# Patient Record
Sex: Male | Born: 1950 | Race: Black or African American | Hispanic: No | Marital: Married | Smoking: Former smoker
Health system: Southern US, Community
[De-identification: ages and names within clinical notes are randomized; demographics above are authoritative.]

## PROBLEM LIST (undated history)

## (undated) DIAGNOSIS — M25561 Pain in right knee: Secondary | ICD-10-CM

## (undated) DIAGNOSIS — R0600 Dyspnea, unspecified: Secondary | ICD-10-CM

## (undated) DIAGNOSIS — I4891 Unspecified atrial fibrillation: Secondary | ICD-10-CM

## (undated) DIAGNOSIS — G4733 Obstructive sleep apnea (adult) (pediatric): Secondary | ICD-10-CM

## (undated) DIAGNOSIS — I5032 Chronic diastolic (congestive) heart failure: Secondary | ICD-10-CM

## (undated) DIAGNOSIS — I499 Cardiac arrhythmia, unspecified: Secondary | ICD-10-CM

## (undated) DIAGNOSIS — Z7901 Long term (current) use of anticoagulants: Secondary | ICD-10-CM

## (undated) DIAGNOSIS — M1990 Unspecified osteoarthritis, unspecified site: Secondary | ICD-10-CM

## (undated) DIAGNOSIS — I4821 Permanent atrial fibrillation: Principal | ICD-10-CM

## (undated) DIAGNOSIS — D649 Anemia, unspecified: Secondary | ICD-10-CM

## (undated) DIAGNOSIS — I10 Essential (primary) hypertension: Secondary | ICD-10-CM

## (undated) DIAGNOSIS — C801 Malignant (primary) neoplasm, unspecified: Secondary | ICD-10-CM

## (undated) DIAGNOSIS — M25562 Pain in left knee: Secondary | ICD-10-CM

## (undated) DIAGNOSIS — J189 Pneumonia, unspecified organism: Secondary | ICD-10-CM

## (undated) HISTORY — DX: Unspecified atrial fibrillation: I48.91

## (undated) HISTORY — DX: Obstructive sleep apnea (adult) (pediatric): G47.33

## (undated) HISTORY — DX: Chronic diastolic (congestive) heart failure: I50.32

## (undated) HISTORY — DX: Permanent atrial fibrillation: I48.21

## (undated) HISTORY — DX: Long term (current) use of anticoagulants: Z79.01

## (undated) HISTORY — DX: Essential (primary) hypertension: I10

## (undated) NOTE — *Deleted (*Deleted)
[name]   [phone] [phone]   Clinic Follow up Note   Patient Care Team: [name], [name] as PCP - General (Family Medicine) [name] [name], MD as PCP - Cardiology (Cardiology) [name], MD as Consulting Physician (General Surgery) [name] [name], MD as Consulting Physician (Cardiology)  Date of Service:  04/14/2020  CHIEF COMPLAINT: Follow up stage III colon cancer  SUMMARY OF ONCOLOGIC HISTORY: Oncology History Overview Note  Cancer of right colon [facility])   Staging form: Colon and Rectum, AJCC 7th Edition   - Clinical stage from 04/12/2016: Stage IIIA (T2, N1, M0) - Signed by [name], MD on 05/01/2016    Cancer of right colon ([place])  03/04/2016 Procedure   COLONOSCOPY: A polypoid and ulcerated non-obstructing large mass was found in the cecum. The mass was noncircumferential. The mass measured three cm in length. In addition, its diameter measured three mm. (Dr. [name])    03/04/2016 Initial Biopsy   Diagnosis 1. Colon, biopsy, cecal - ADENOCARCINOMA. 2. Colon, polyp(s), transverse - BENIGN COLONIC MUCOSA WITH BENIGN LYMPHOID AGGREGATE. - NO ADENOMATOUS CHANGE OR MALIGNANCY IDENTIFIED. Microscopic Comment 1. The biopsies are involved by moderately to poorly differentiated colorectal type adenocarcinoma.   03/04/2016 Initial Diagnosis   Cecal cancer ([place])   03/15/2016 Imaging   1. Polypoid cecal mass. No findings of liver metastatic disease or other definite metastatic disease. There are some small adjacent pericecal lymph nodes which are not pathologically enlarged. 2. Adenopathy in the chest is present but was also present in 2010, albeit slightly less prominent. This may be reactive adenopathy related to the chronic exudative right pleural effusion.    04/07/2016 Tumor Marker   Patient's tumor was tested for the following markers: CEA. Results of the tumor marker test revealed 2.7.   04/12/2016 Definitive  Surgery   Laparoscopic right hemicolectomy (Dr. [name])   04/12/2016 Pathologic Stage   pT2 pN1 pMX--Grade 3 adenocarcinoma with 2/16 nodes positive, clear margins, negative for perineural or lymphvascular invasion; invades muscularis propria Loss of expression MLH1/PMS2   06/02/2016 - 09/13/2016 Adjuvant Chemotherapy    Ajuvant chemotherapy FOLFOX, every 2 weeks for 6 cycles (3 months), last cycle postponed due to hospitalization    06/29/2016 Genetic Testing   POLE c.[id number] VUS identified on the Colorectal cancer panel.  Negative genetic testing for the MSH2 inversion analysis ([place] inversion). The Colorectal Cancer Panel offered by [facility] includes sequencing and/or duplication/deletion testing of the following 19 genes: APC, ATM, AXIN2, BMPR1A, CDH1, CHEK2, EPCAM, MLH1, MSH2, MSH6, MUTYH, PMS2, POLD1, POLE, PTEN, SCG5/GREM1, SMAD4, STK11, and TP53. The report date is 06/22/2016 for the [facility] panel and 06/29/2016 for the [place] inversion.  POLE c.4523G>A VUS has been reclassified to a likely benign variant based on a combination of sources, e.g, internal data, published literature, population databases and in silico models. The reclassification date is June 09, 2017.    08/17/2016 - 08/20/2016 Hospital Admission   Admit date: 08/17/2016 Admission diagnosis: Acute respiratory failure with hypoxia  Additional comments: Patient was admitted initially with hypoxia and found to have H influenza positive-started on Tamiflu and broad-spectrum antibiotics were D escalate. He was hypotensive on admission given IV saline 75 cc per hour saline lock 2/16 and antihypertensives which were held including lisinopril and Lasix and hydralazine on discharge. Joint hospitalization he continued on his metoprolol twice a day   01/20/2017 Imaging   CT CAP W Contrast 01/20/17 IMPRESSION: 1. No evidence of local tumor recurrence at the  ileocolic anastomosis . 2. No findings suspicious for metastatic disease in the  chest, abdomen or pelvis. 3. Mild mediastinal lymphadenopathy is stable since 2010 and considered benign . 4. Stable morphologic changes suggestive of cirrhosis. 5. Stable patulous fluid-filled thoracic esophagus. Patchy ground-glass opacity in the posterior right upper lobe is probably inflammatory, possibly due to aspiration. 6. Stable chronic small loculated dependent right pleural effusion.    09/15/2017 Pathology Results   09/15/2017 Surgical Pathology Diagnosis Colon, polyp(s), sigmoid - TUBULAR ADENOMA. - NO HIGH GRADE DYSPLASIA OR MALIGNANCY.   01/15/2018 Imaging   01/15/2018 CT CAP W Contrast IMPRESSION: 1. No findings identified to suggest local tumor recurrence at the ileocolic anastomosis. No evidence for distant metastatic disease. 2. Stable mild mediastinal lymphadenopathy since 2010 and considered benign. 3. Similar appearance of patulous fluid-filled thoracic esophagus. 4. Unchanged right posterior pleural thickening with loculated pleural fluid. 5.  Aortic Atherosclerosis (ICD10-I70.0).   01/15/2018 Tumor Marker   CEA: 04/07/16: 2.7 04/27/2016: 2.11 07/14/2016: 4.25 10/21/2016: 3.60 01/16/17: 3.76 04/25/2017: 5.16 08/28/2017: 2.06 01/15/2018: 2.90    01/28/2019 Imaging   CT CAP W Contrast  IMPRESSION: Stable exam. No evidence of recurrent or metastatic carcinoma. No acute findings.      CURRENT THERAPY:  Surveillance  INTERVAL HISTORY: *** Steven Barnett is here for a follow up of colon cancer. He presents to the clinic alone.    REVIEW OF SYSTEMS:  *** Constitutional: Denies fevers, chills or abnormal weight loss Eyes: Denies blurriness of vision Ears, nose, mouth, throat, and face: Denies mucositis or sore throat Respiratory: Denies cough, dyspnea or wheezes Cardiovascular: Denies palpitation, chest discomfort or lower extremity swelling Gastrointestinal:  Denies nausea, heartburn or change in bowel habits Skin: Denies abnormal skin rashes  Lymphatics: Denies new lymphadenopathy or easy bruising Neurological:Denies numbness, tingling or new weaknesses Behavioral/Psych: Mood is stable, no new changes  All other systems were reviewed with the patient and are negative.  MEDICAL HISTORY:  Past Medical History:  Diagnosis Date  . Anemia   . Arthralgia of both knees   . Arthritis    "right knee" (10/01/2015)  . Atrial fibrillation ([place])   . Cancer (HCC)    STAGE 1 COLON CANCER  . Chronic anticoagulation 2010   Coumadin  . Chronic diastolic CHF (congestive heart failure), NYHA class 2 ([place])   . Dyspnea   . Dysrhythmia   . Hypertension   . OSA (obstructive sleep apnea) 2016   "couldn't take the mask during the testing" (10/01/2015)  . Permanent atrial fibrillation ([name]) 2010  . Pneumonia 3/29/2017and june 2017    SURGICAL HISTORY: Past Surgical History:  Procedure Laterality Date  . COLONOSCOPY WITH PROPOFOL N/A 03/04/2016   Procedure: COLONOSCOPY WITH PROPOFOL;  Surgeon: Carol Ada, MD;  Location: [facility];  Service: Endoscopy;  Laterality: N/A;  . COLONOSCOPY WITH PROPOFOL N/A 09/15/2017   Procedure: COLONOSCOPY WITH PROPOFOL;  Surgeon: Carol Ada, MD;  Location: [facility];  Service: Endoscopy;  Laterality: N/A;  . LAPAROSCOPIC PARTIAL COLECTOMY N/A 04/12/2016   Procedure: LAPAROSCOPIC ILEOCOLECTOMY;  Surgeon: [name], MD;  Location: [place];  Service: General;  Laterality: N/A;  . PORT-A-CATH REMOVAL N/A 02/16/2017   Procedure: REMOVAL PORT-A-CATH;  Surgeon: [name], MD;  Location: [place];  Service: General;  Laterality: N/A;  . PORTACATH PLACEMENT N/A 05/18/2016   Procedure: INSERTION PORT-A-CATH;  Surgeon: [name], MD;  Location: [place];  Service: General;  Laterality: N/A;  . THORACOTOMY Right 2010    I have  reviewed the social history and family history with the patient and they are unchanged from previous note.  ALLERGIES:  has No Known Allergies.  MEDICATIONS:   Current Outpatient Medications  Medication Sig Dispense Refill  . acetaminophen (TYLENOL) 500 MG tablet Take 1,000 mg by mouth 2 (two) times daily.    Marland Kitchen albuterol (PROVENTIL HFA;VENTOLIN HFA) 108 (90 Base) MCG/ACT inhaler Inhale 2 puffs into the lungs every 6 (six) hours as needed for wheezing or shortness of breath. 1 Inhaler 2  . aspirin EC 81 MG tablet Take 81 mg by mouth daily.    . colchicine 0.6 MG tablet Take 0.6 mg by mouth 2 (two) times daily.    Marland Kitchen diltiazem (CARDIZEM) 120 MG tablet TAKE 1 TABLET BY MOUTH ONCE DAILY 30 tablet 6  . furosemide (LASIX) 80 MG tablet Take 1 tablet (80 mg total) by mouth 2 (two) times daily. 180 tablet 3  . hydrALAZINE (APRESOLINE) 100 MG tablet     . metolazone (ZAROXOLYN) 5 MG tablet TAKE 1 TABLET BY MOUTH TWICE A WEEK ON  WEDNESDAYS  AND  SATURDAYS 15 tablet 6  . metoprolol tartrate (LOPRESSOR) 25 MG tablet Take 1 tablet (25 mg total) by mouth 2 (two) times daily. 180 tablet 3  . MITIGARE 0.6 MG CAPS     . potassium chloride SA (KLOR-CON) 20 MEQ tablet TAKE 1  BY MOUTH ONCE DAILY 90 tablet 3  . Turmeric (QC TUMERIC COMPLEX PO) Take 1 tablet by mouth daily.    . valsartan (DIOVAN) 320 MG tablet TAKE 1 TABLET BY MOUTH ONCE DAILY    . warfarin (COUMADIN) 4 MG tablet TAKE 2 TABLETS BY MOUTH ONCE DAILY AS DIRECTED BY  COUMADIN  CLINIC 60 tablet 0   No current facility-administered medications for this visit.   Facility-Administered Medications Ordered in Other Visits  Medication Dose Route Frequency Provider Last Rate Last Admin  . heparin lock flush 100 unit/mL  500 Units Intravenous Once PRN [name], MD      . sodium chloride flush (NS) 0.9 % injection 10 mL  10 mL Intravenous PRN [name], MD        PHYSICAL EXAMINATION: ECOG PERFORMANCE STATUS: {CHL ONC ECOG [id number]  There were no vitals filed for this visit. There were no vitals filed for this visit. *** GENERAL:alert, no distress and comfortable SKIN: skin color, texture, turgor are  normal, no rashes or significant lesions EYES: normal, Conjunctiva are pink and non-injected, sclera clear {OROPHARYNX:no exudate, no erythema and lips, buccal mucosa, and tongue normal}  NECK: supple, thyroid normal size, non-tender, without nodularity LYMPH:  no palpable lymphadenopathy in the cervical, axillary {or inguinal} LUNGS: clear to auscultation and percussion with normal breathing effort HEART: regular rate & rhythm and no murmurs and no lower extremity edema ABDOMEN:abdomen soft, non-tender and normal bowel sounds Musculoskeletal:no cyanosis of digits and no clubbing  NEURO: alert & oriented x 3 with fluent speech, no focal motor/sensory deficits  LABORATORY DATA:  I have reviewed the data as listed CBC Latest Ref Rng & Units 04/01/2020 08/02/2019 01/28/2019  WBC 4.0 - 10.5 K/uL 5.9 5.3 6.4  Hemoglobin 13.0 - 17.0 g/dL 15.3 16.0 16.4  Hematocrit 39 - 52 % 47.0 51.2 51.8  Platelets 150 - 400 K/uL 156 180 171     CMP Latest Ref Rng & Units 04/01/2020 11/13/2019 08/02/2019  Glucose 70 - 99 mg/dL 94 84 107(H)  BUN 8 - 23 mg/dL 52(H) 19 17  Creatinine 0.61 - 1.24 mg/dL  1.57(H) 1.11 1.00  Sodium 135 - 145 mmol/L 138 139 139  Potassium 3.5 - 5.1 mmol/L 3.5 4.5 3.8  Chloride 98 - 111 mmol/L 96(L) 98 102  CO2 22 - 32 mmol/L 28 28 31   Calcium 8.9 - 10.3 mg/dL 9.5 9.4 8.9  Total Protein 6.5 - 8.1 g/dL 8.1 - 7.6  Total Bilirubin 0.3 - 1.2 mg/dL 0.9 - 0.9  Alkaline Phos 38 - 126 U/L 97 - 133(H)  AST 15 - 41 U/L 19 - 17  ALT 0 - 44 U/L 16 - 13      RADIOGRAPHIC STUDIES: I have personally reviewed the radiological images as listed and agreed with the findings in the report. No results found.   ASSESSMENT & PLAN:  WESTEN CANCELLIERI is a 29 y.o. male with    1. Right colon cancer, invasive adenocarcinoma, G3, pT2N1M0, stage IIIA, MSI-H -He was diagnosed in 03/2016. He is s/p right hemicolectomy and adjuvant chemo FOLFOX -His last colonoscopy was 09/2017, he had 1 small benign  polyp. Overall benign exam. Will repeat in 3 years, 2022. *** -He is clinically stable and doing well. Labs reviewed, CBC And CMP WN. CEA and iron panel still pending.  -He is 4 years from diagnosis. His risk of recurrence has significantly reduced. Do not plan to repeat routine scan unless he develops concerning symptoms. Continue 5 year surveillance.  -Lab and F/u in 1 year for final visit   2. HTN, OSA, CHF, obesity -He will continue medications and continue to follow-up with his primary care physician -He is on Coumadin, managed by PCP.  -His recent leg and knee pain much improved on Tumeric. He can continue.    Plan -Lab and F/u in 1 year.     No problem-specific Assessment & Plan notes found for this encounter.   No orders of the defined types were placed in this encounter.  All questions were answered. The patient knows to call the clinic with any problems, questions or concerns. No barriers to learning was detected. The total time spent in the appointment was {CHL ONC TIME VISIT - [id number].     Joslyn Devon 04/14/2020   Oneal Deputy, am acting as scribe for [name], MD.   {Add scribe attestation statement}

---

## 2008-07-04 DIAGNOSIS — Z7901 Long term (current) use of anticoagulants: Secondary | ICD-10-CM

## 2008-07-04 DIAGNOSIS — I4821 Permanent atrial fibrillation: Secondary | ICD-10-CM

## 2008-07-04 HISTORY — PX: THORACOTOMY: SUR1349

## 2008-07-04 HISTORY — DX: Long term (current) use of anticoagulants: Z79.01

## 2008-07-04 HISTORY — DX: Permanent atrial fibrillation: I48.21

## 2008-11-03 ENCOUNTER — Encounter: Admission: RE | Admit: 2008-11-03 | Discharge: 2008-11-03 | Payer: Self-pay | Admitting: Internal Medicine

## 2008-11-03 IMAGING — CR DG CHEST 2V
2 series · 2 of 2 positions shown · non-contrast
Comparison: No priors

CLINICAL DATA: Cough/short of breath

CHEST - 2 VIEW

[view not recorded (1 of 2)]
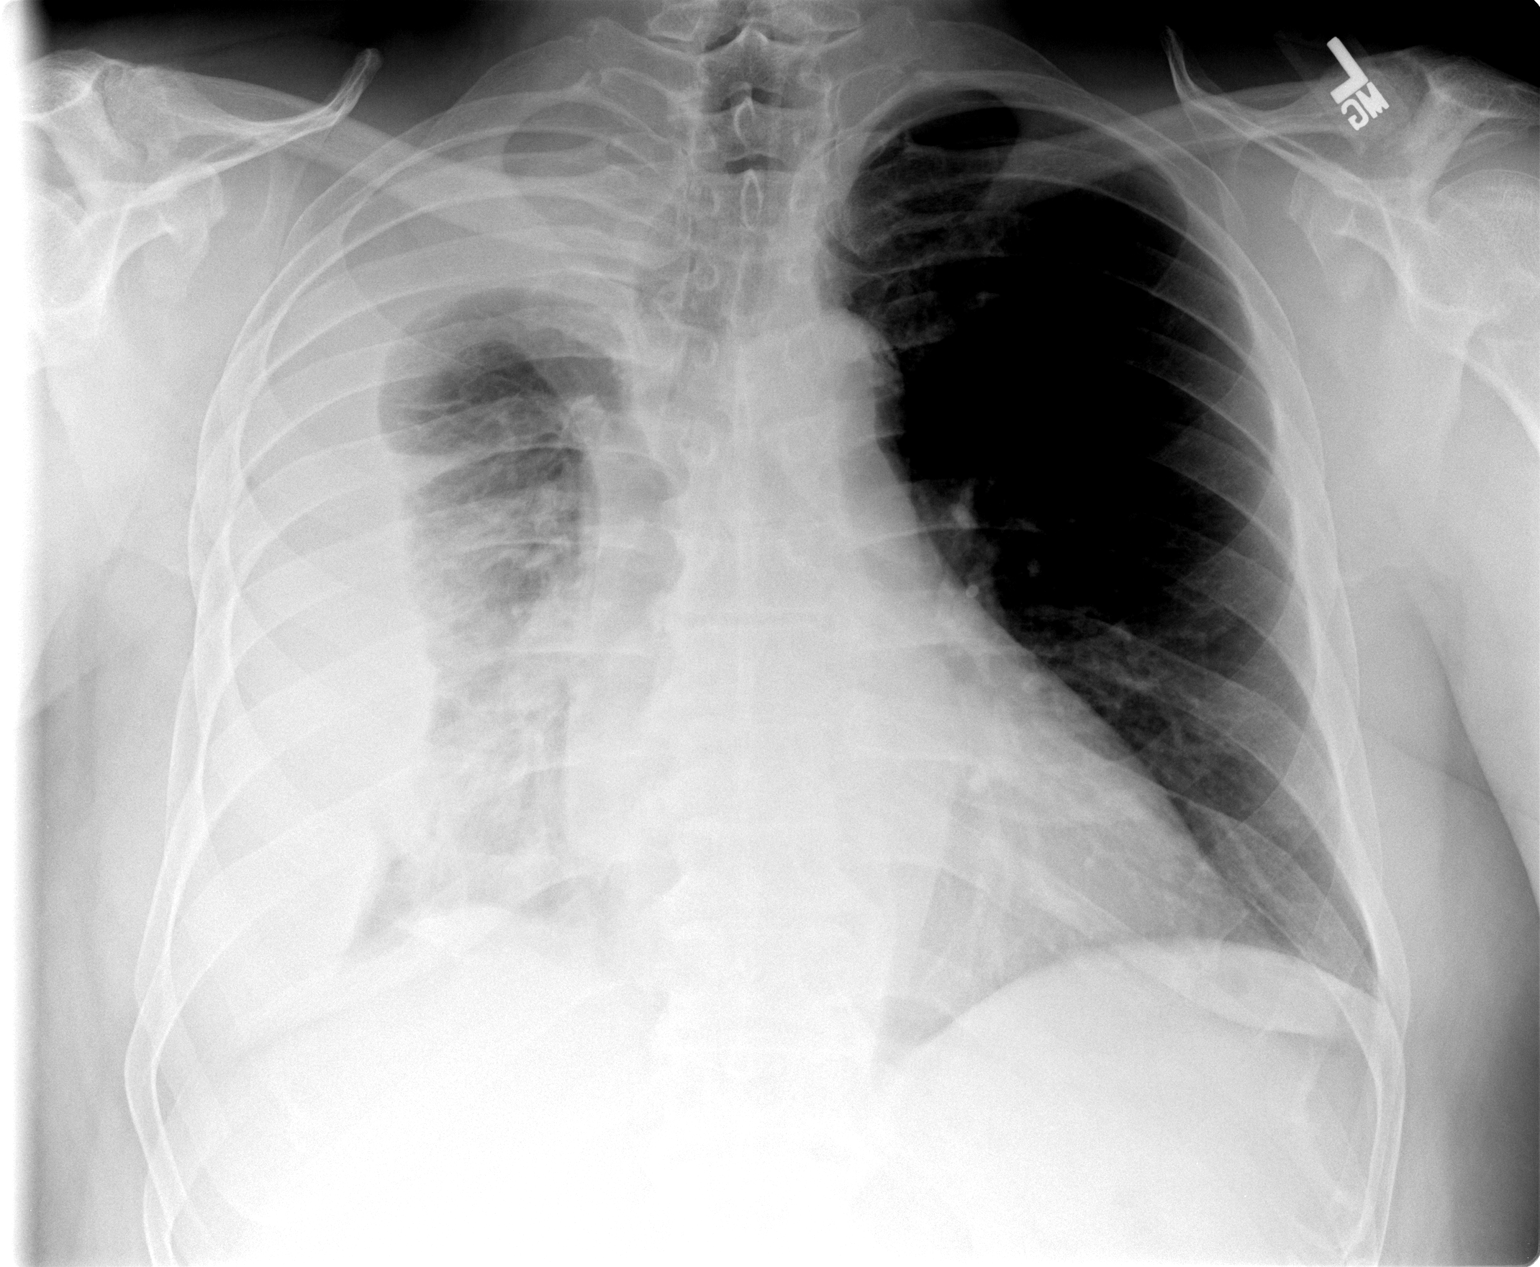

[view not recorded (2 of 2)]
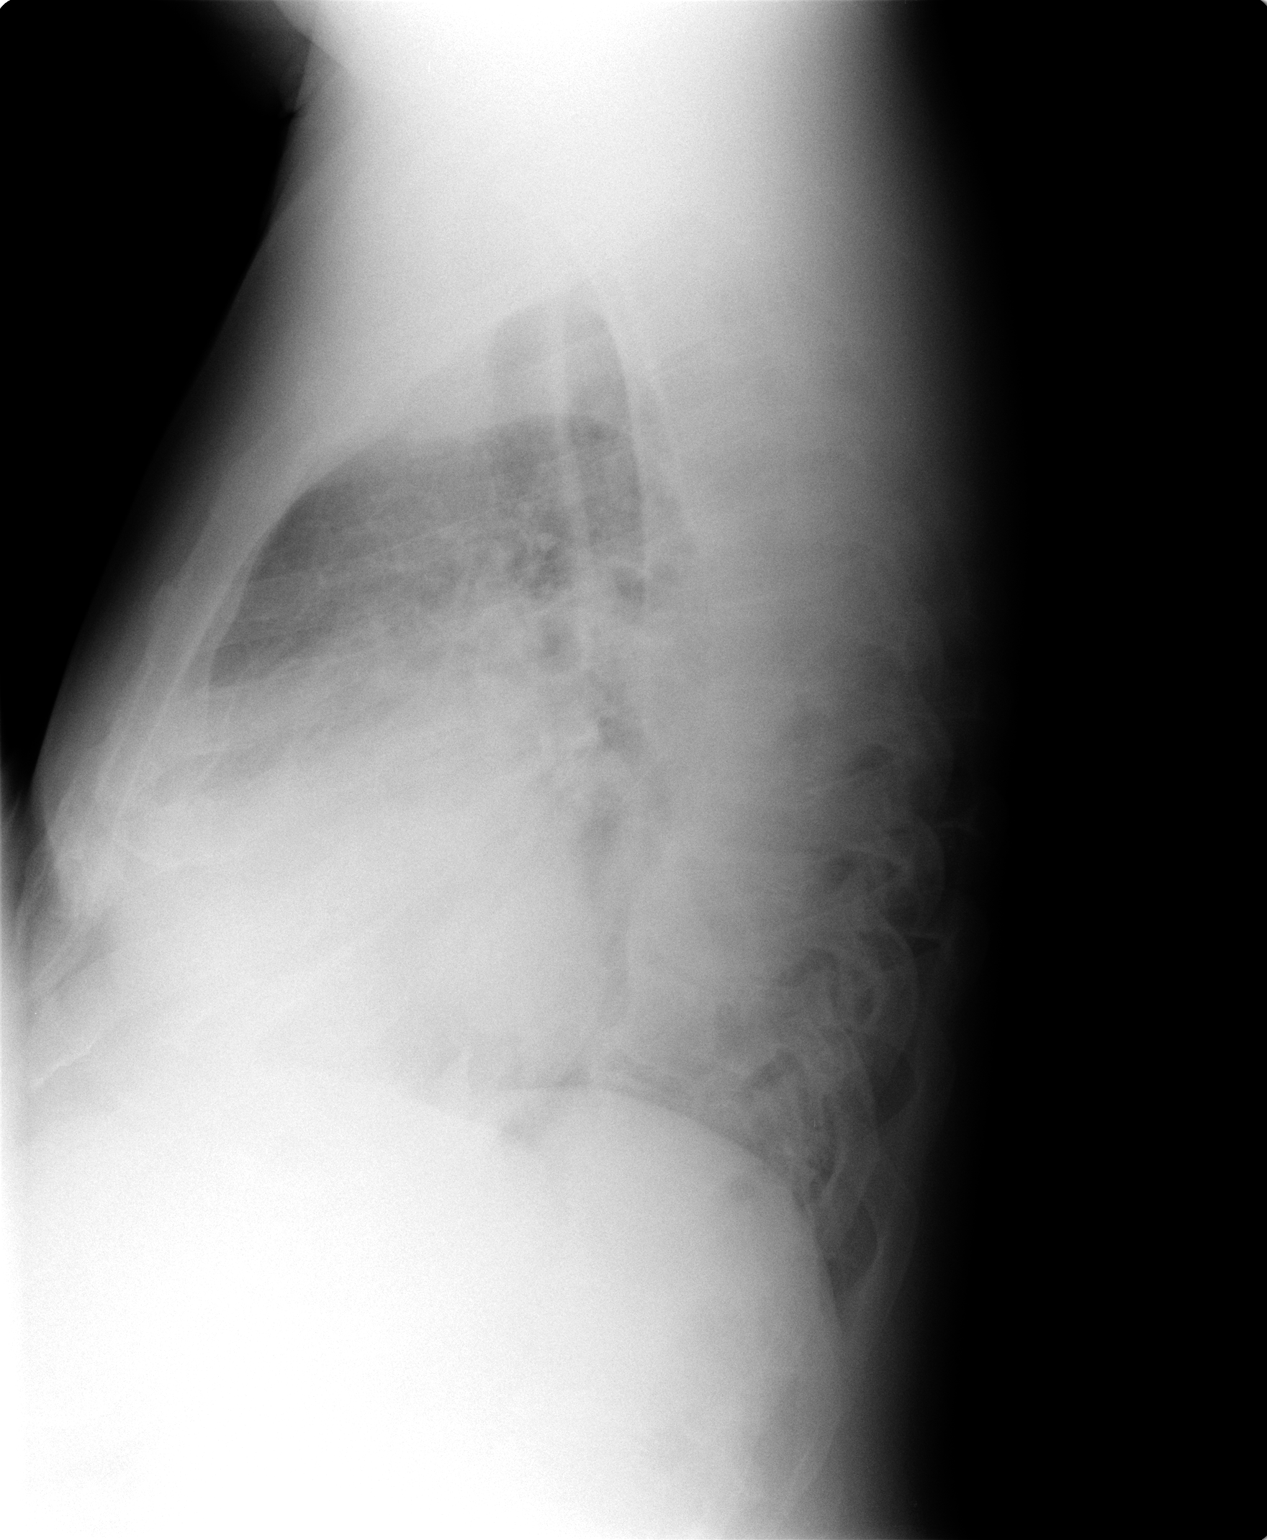

[2 of 2 positions shown; findings below may reference images not displayed]

FINDINGS: Heart size upper normal.  There is a peripherally
positioned right pleural density with medial atelectasis or
atelectatic pneumonia in the right lung.  This is likely
inflammatory but neoplasm cannot be excluded.  This needs careful
clinical correlation.  If the patient does not clinically have
pneumonia, consider CT for further assessment.  If the patient
clinically has pneumonia, short term follow-up chest x-ray
recommended.

Left lung clear.  Osseous structures intact.
IMPRESSION: Abnormal right pleural and parenchymal densities - probably
inflammatory.  See above discussion.

## 2008-11-10 ENCOUNTER — Encounter: Admission: RE | Admit: 2008-11-10 | Discharge: 2008-11-10 | Payer: Self-pay | Admitting: Internal Medicine

## 2008-11-10 IMAGING — CT CT CHEST W/ CM
3 of 4 series · 17 of 30 positions shown, 19 images · IV contrast (75CC OMNI 300)
Comparison: Chest x-ray [DATE]

CLINICAL DATA: Shortness of breath, cough, abnormal chest x-ray.

CT CHEST WITH CONTRAST
TECHNIQUE: Multidetector CT imaging of the chest was performed
following the standard protocol during bolus administration of
intravenous contrast.
Contrast: 75 ml [UU]

[Series 3: routine chest · axial · 0.83mm/px · z∈[-297,-52]mm · 5 of 75 slices shown, 7 images]
[im 13/75  mediastinal]
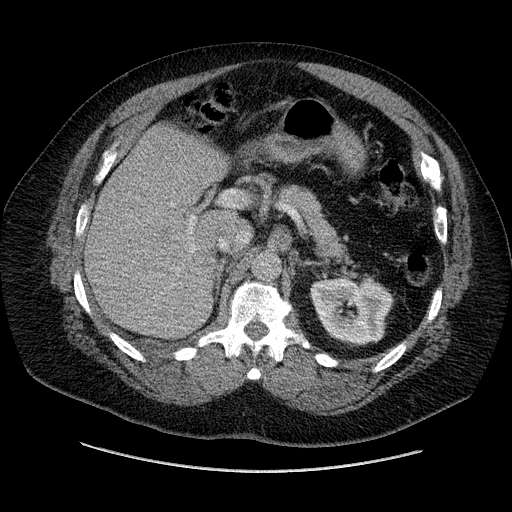
[im 13/75  lung]
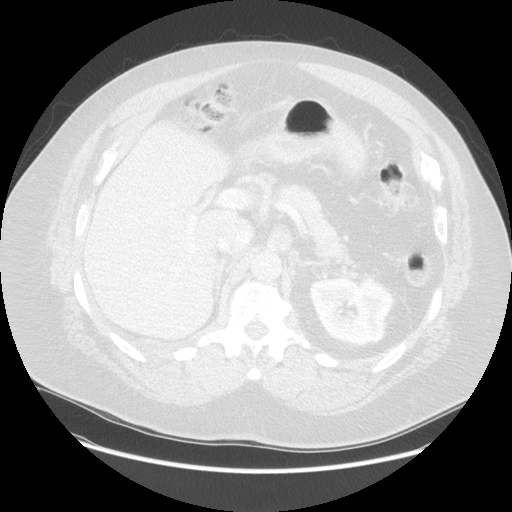
[im 25/75  lung]
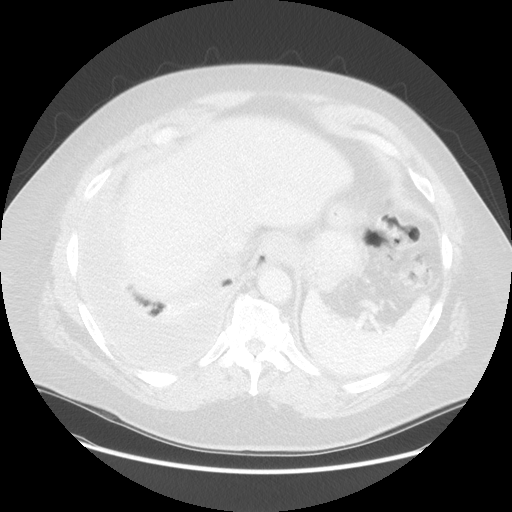
[im 38/75  lung]
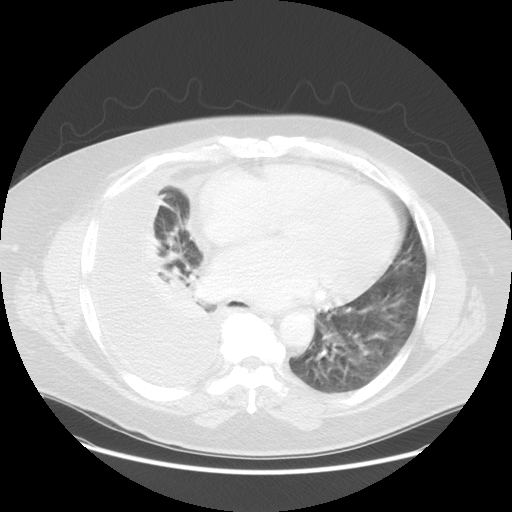
[im 50/75  lung]
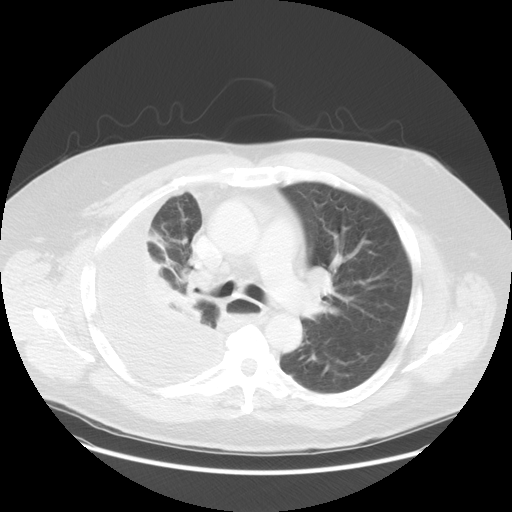
[im 62/75  mediastinal]
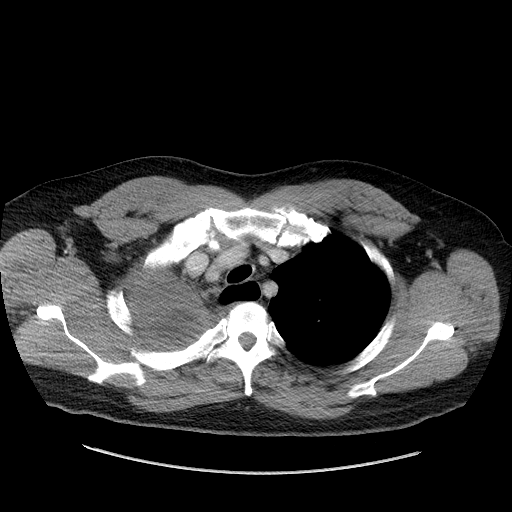
[im 62/75  lung]
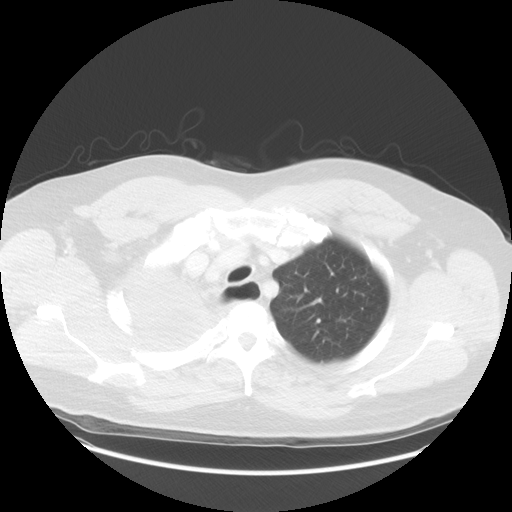

[Series 4: lung windows · axial · 0.83mm/px · z∈[-247,-52]mm · 4 of 65 slices shown]
[im 13/65  lung]
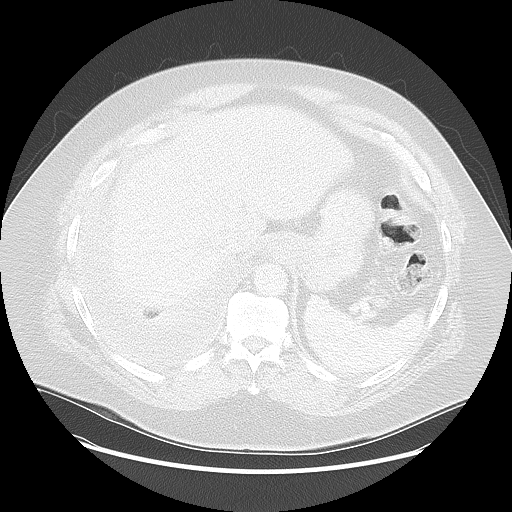
[im 26/65  lung]
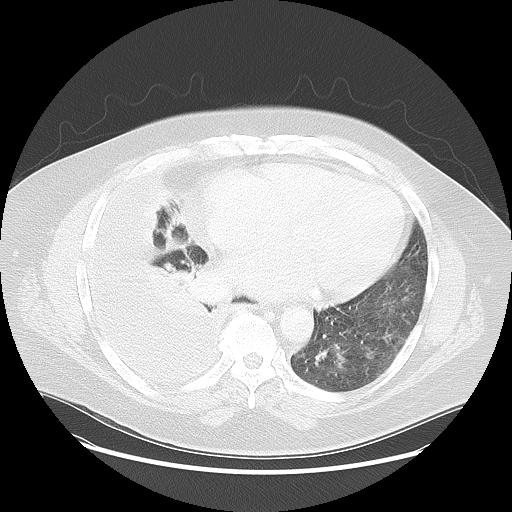
[im 39/65  lung]
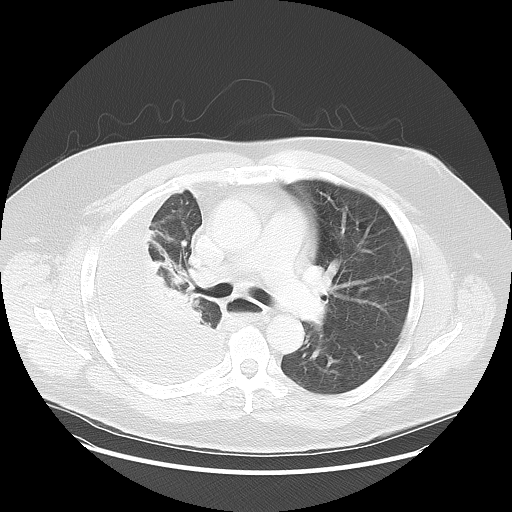
[im 52/65  lung]
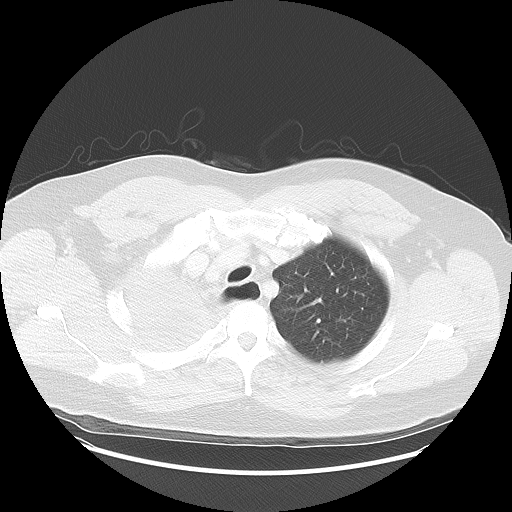

[Series 602: sagittal body · sagittal · 0.83mm/px · 8 of 171 slices shown]
[im 13/171  mediastinal]
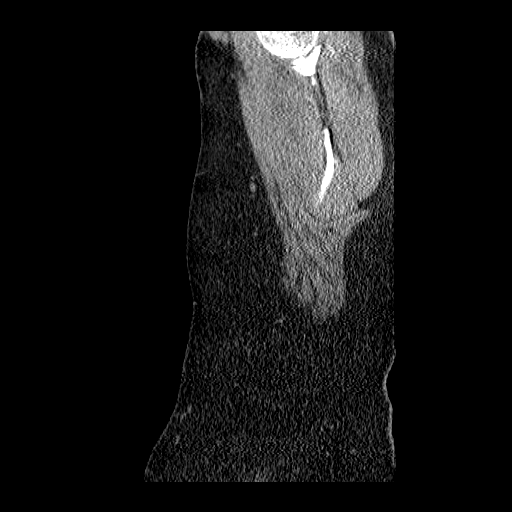
[im 37/171  mediastinal]
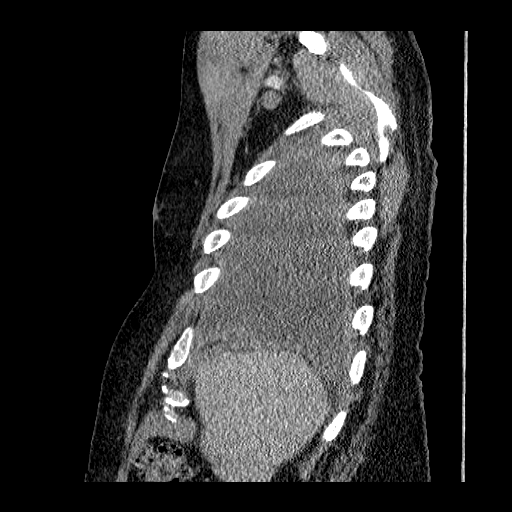
[im 61/171  mediastinal]
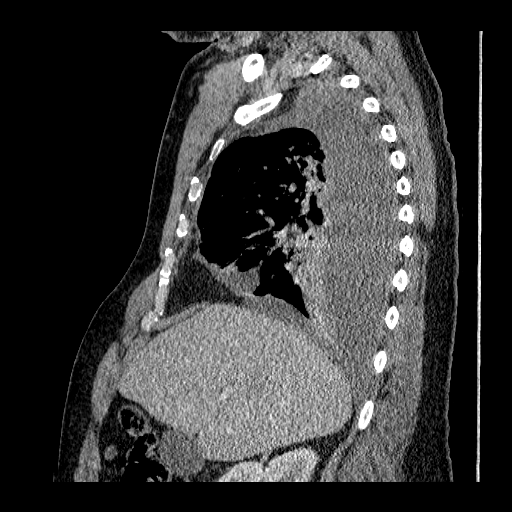
[im 73/171  mediastinal]
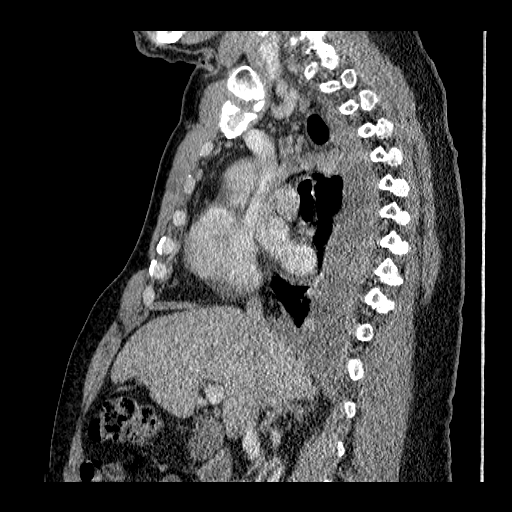
[im 98/171  mediastinal]
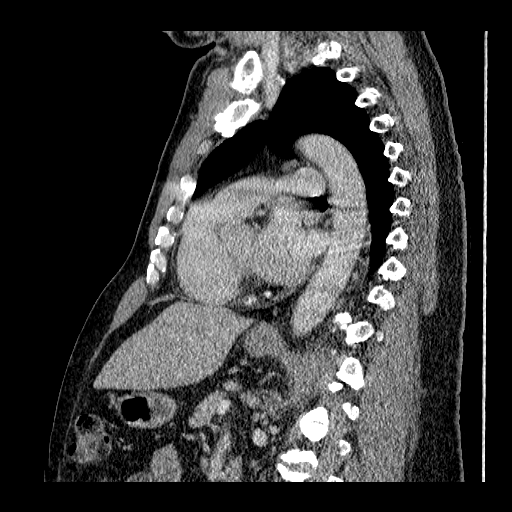
[im 110/171  mediastinal]
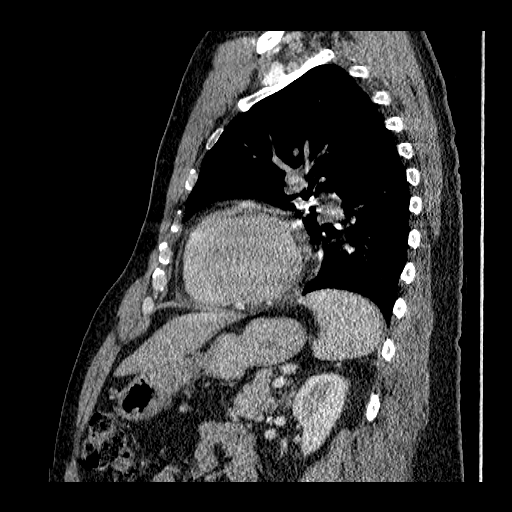
[im 134/171  mediastinal]
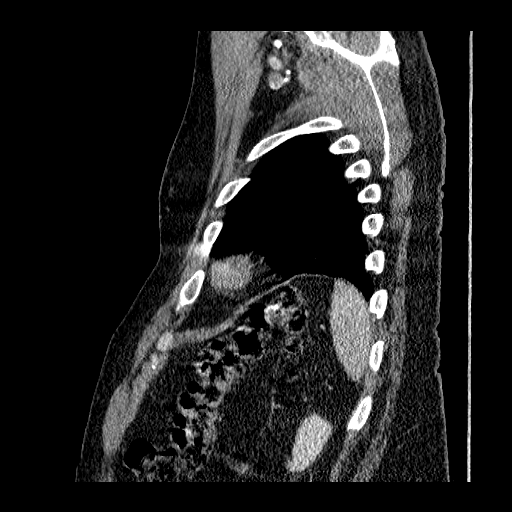
[im 158/171  mediastinal]
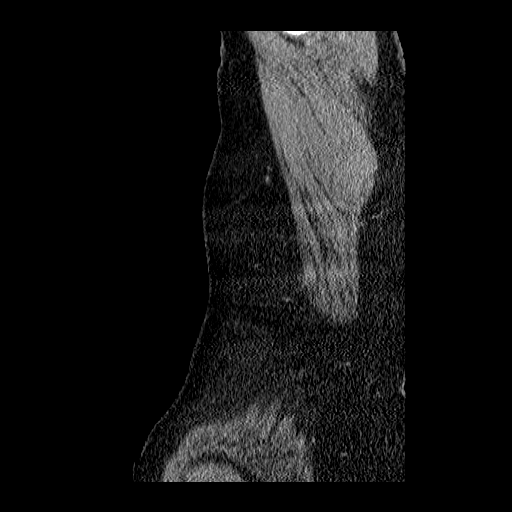

[17 of 30 positions shown; findings below may reference images not displayed]

FINDINGS: Right paratracheal and right hilar lymph nodes measure up
to 11 mm in short axis.  Pulmonary arteries are enlarged, as is the
heart.  Esophagus is dilated throughout its course.  There are pre
pericardiac lymph nodes.

Right pleural effusion is large and partially loculated, and there
is pleural enhancement. Collapse/consolidation is seen in the right
lung.  Left lung is clear.

Incidental imaging of the upper abdomen shows normal adrenal
glands.  No worrisome lytic or sclerotic lesions.
IMPRESSION: 1.  Large, partially loculated right pleural effusion, with pleural
enhancement.  Appearance is suggestive of empyema.  Malignancy
cannot be excluded.
2.  Compressive atelectasis and/or pneumonia in the right lung.
Underlying malignancy cannot be excluded.
3.  Mediastinal and right hilar adenopathy may be reactive.
4.  Cardiac enlargement and pulmonary arterial hypertension.
5.  Esophageal dysmotility.

## 2008-11-19 ENCOUNTER — Inpatient Hospital Stay (HOSPITAL_COMMUNITY): Admission: AD | Admit: 2008-11-19 | Discharge: 2008-11-27 | Payer: Self-pay | Admitting: Internal Medicine

## 2008-11-19 ENCOUNTER — Ambulatory Visit: Payer: Self-pay | Admitting: Cardiology

## 2008-11-20 ENCOUNTER — Encounter (INDEPENDENT_AMBULATORY_CARE_PROVIDER_SITE_OTHER): Payer: Self-pay | Admitting: Internal Medicine

## 2008-11-20 ENCOUNTER — Encounter: Payer: Self-pay | Admitting: Cardiology

## 2008-11-20 ENCOUNTER — Encounter (INDEPENDENT_AMBULATORY_CARE_PROVIDER_SITE_OTHER): Payer: Self-pay | Admitting: Diagnostic Radiology

## 2008-11-20 IMAGING — US US PARACENTESIS
1 series · 6 of 6 positions shown · non-contrast
Comparison: none

CLINICAL HISTORY: Loculated right pleural effusion.

[Series 1: us paracentesis · 0.26mm/px · 6 of 6 slices shown]
[im 1/6]
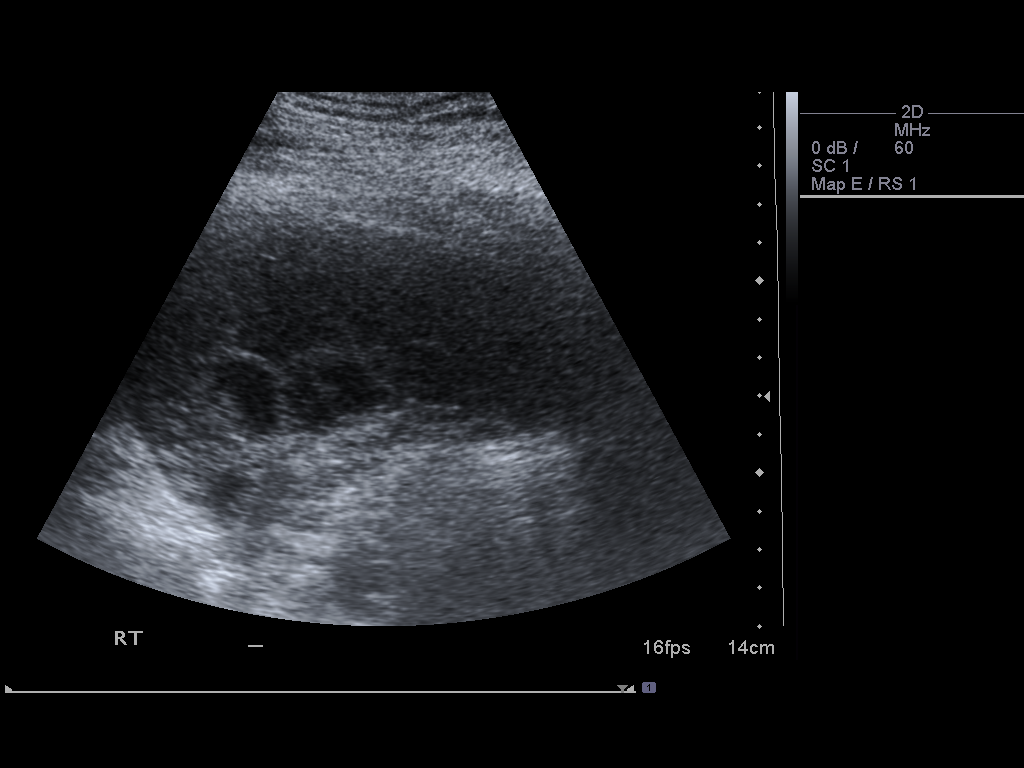
[im 2/6]
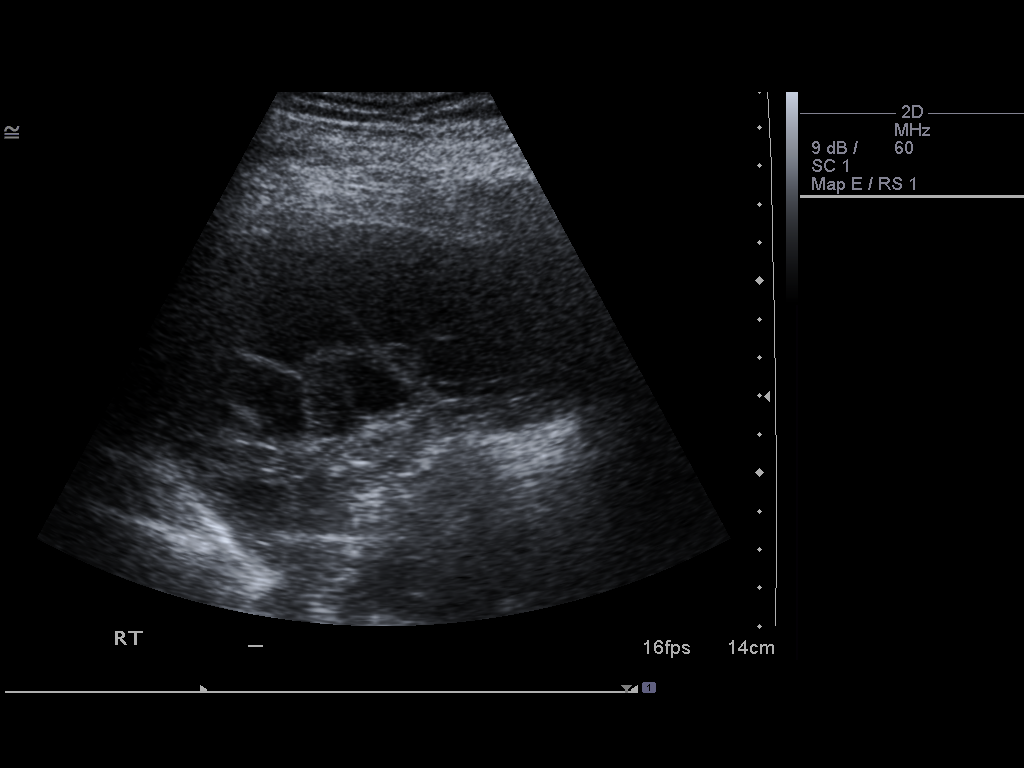
[im 3/6]
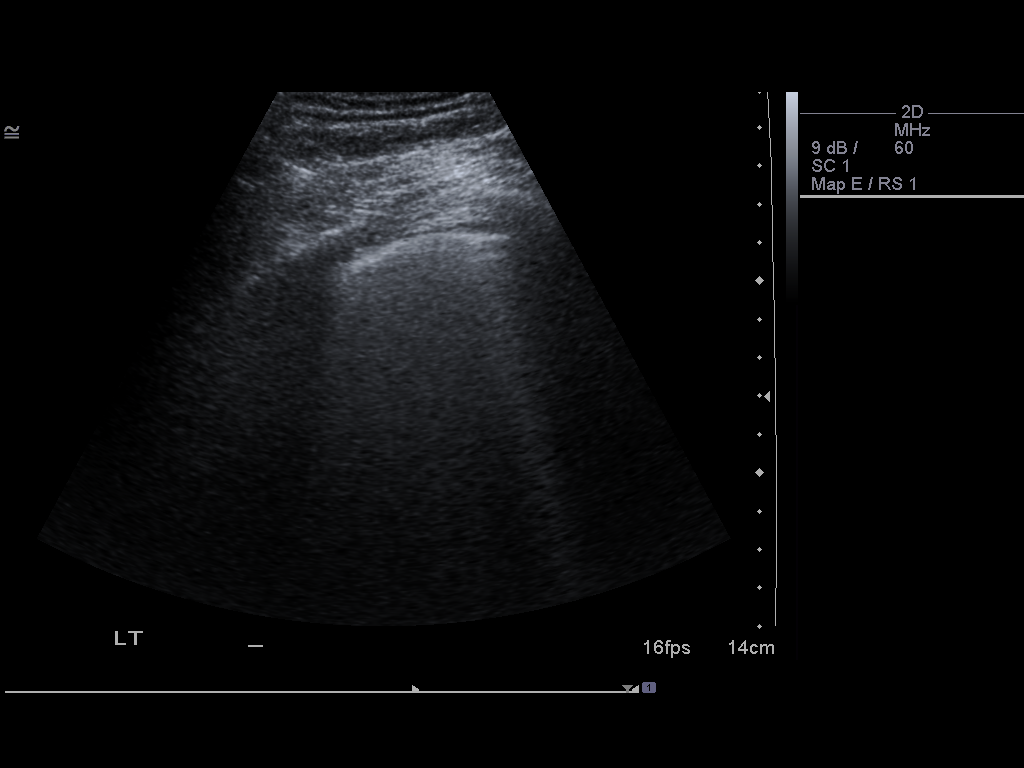
[im 4/6]
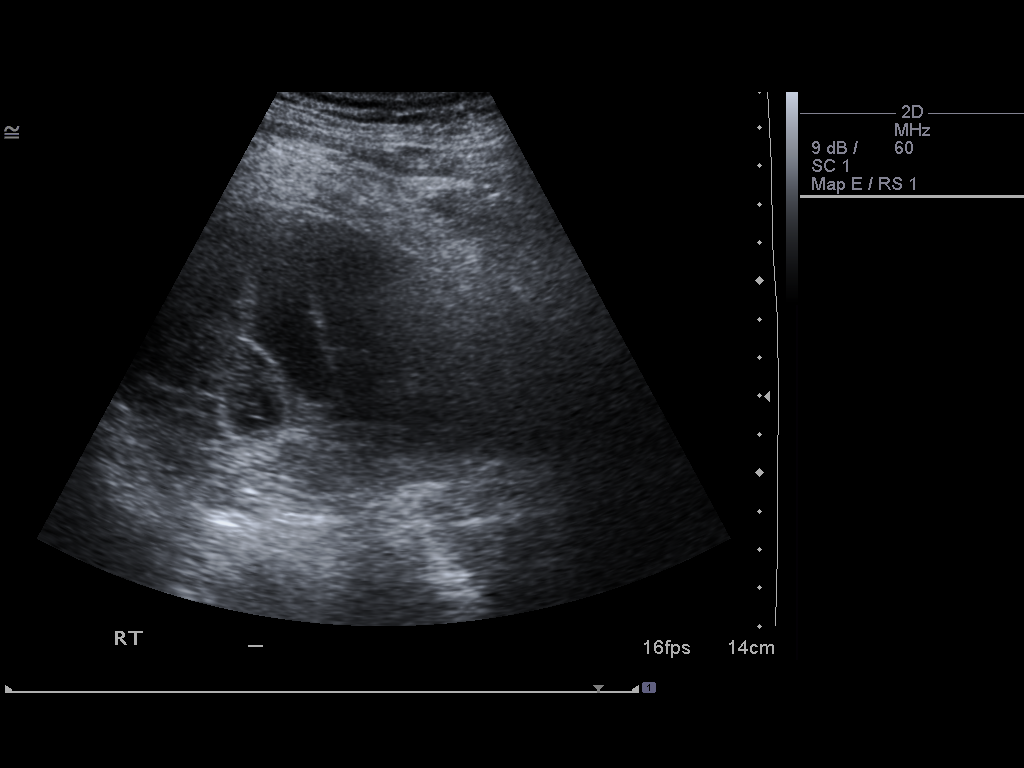
[im 5/6]
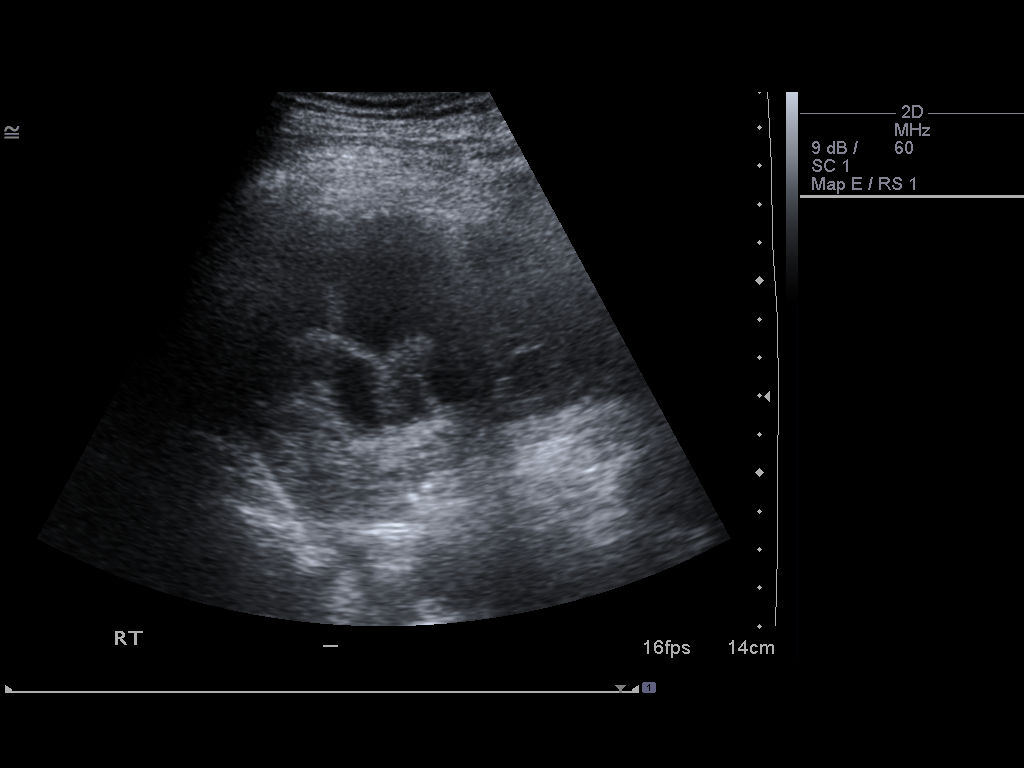
[im 6/6]
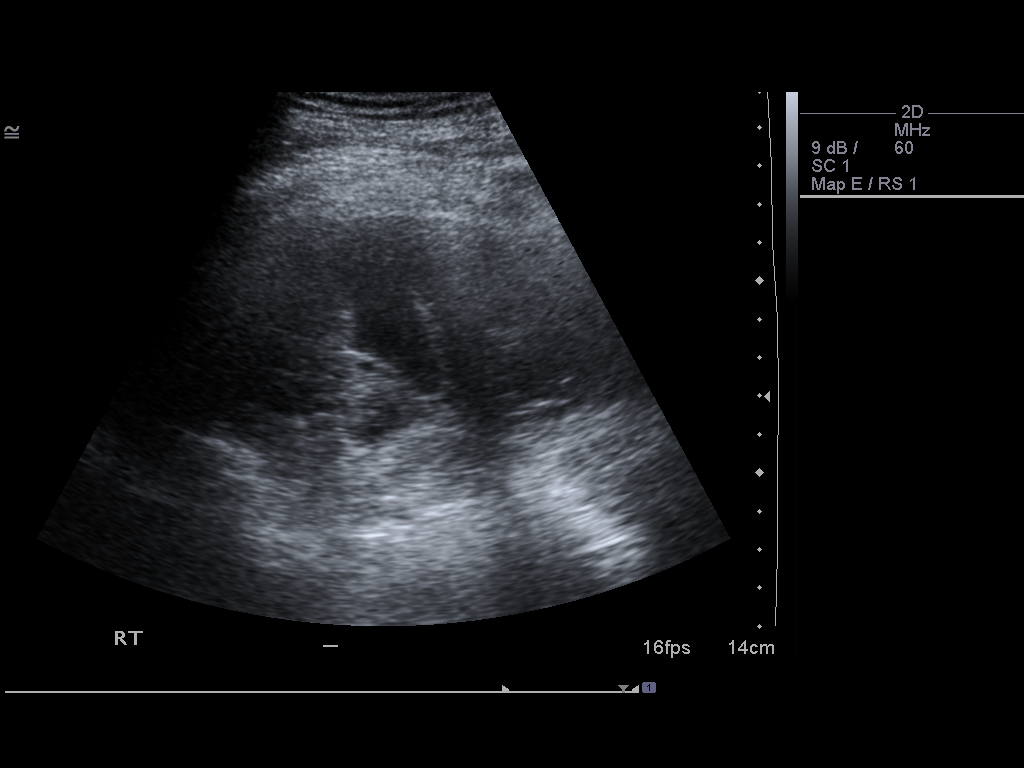

[6 of 6 positions shown; findings below may reference images not displayed]

PROCEDURE(S): ULTRASOUND GUIDED RIGHT THORACENTESIS

Medications:None

Sedation time:None

Fluoroscopy time: None

Procedure:The procedure was explained to the patient.  The risks
and benefits of the procedure were discussed and the patient's
questions were addressed.  Informed consent was obtained from the
patient. .  The right lung was evaluated with ultrasound.  The
right lung is composed of complex fluid collections consistent with
an empyema or loculated pleural fluid.  Largest pocket of fluid was
identified in the posterior right lung.  Skin was prepped with
Betadine and a sterile drape was placed.  The skin was anesthetized
with 1% lidocaine.  A 5-French Yueh catheter was directed into the
right pleural space with ultrasound guided and 3 ml of bloody fluid
was removed.
FINDINGS: Complex loculated right pleural fluid.
IMPRESSION: Complex loculated right pleural fluid concerning for an
empyema.  Only 3 ml of bloody fluid could be removed.

## 2008-11-20 IMAGING — CR DG CHEST 1V
1 series · 1 of 1 positions shown · non-contrast
Comparison: [DATE].

CLINICAL DATA: Right-sided pleural effusion.  Post thoracentesis.

CHEST - 1 VIEW

[view not recorded]
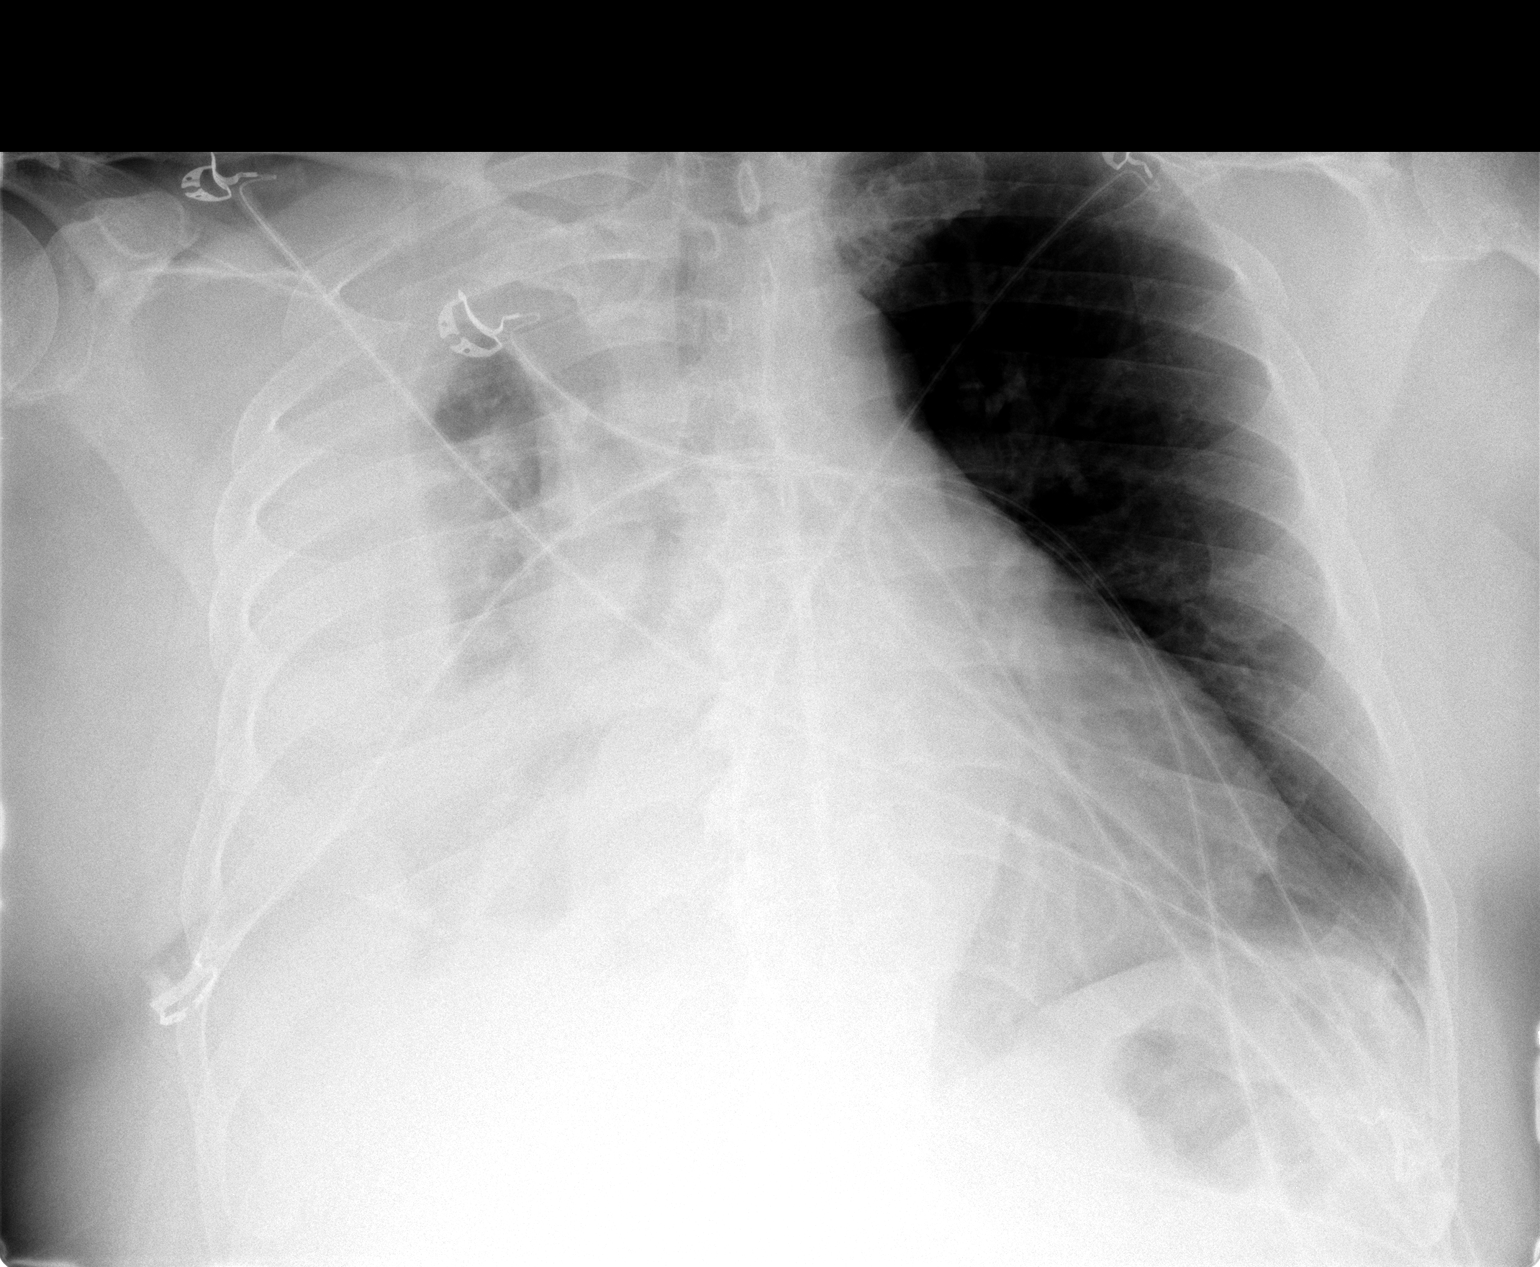

[1 of 1 positions shown; findings below may reference images not displayed]

FINDINGS: Circumferential pleural thickening in the right
hemithorax is again noted compatible with loculated fluid, empyema,
and / or metastatic disease.  No evidence for pneumothorax status
post thoracentesis.  The left lung remains clear. The
cardiopericardial silhouette is enlarged. Telemetry leads overlie
the chest.
IMPRESSION: Persistent right circumferential pleural fluid/thickening without
evidence for pneumothorax status post thoracentesis.

## 2008-11-21 ENCOUNTER — Ambulatory Visit: Payer: Self-pay | Admitting: Surgery

## 2008-11-22 ENCOUNTER — Encounter: Payer: Self-pay | Admitting: Surgery

## 2008-11-22 IMAGING — CR DG CHEST 1V PORT
1 series · 1 of 1 positions shown · non-contrast
Comparison: [DATE]

CLINICAL DATA: Right-sided pleural effusion.  Status post
thoracotomy, VATS.

PORTABLE CHEST - 1 VIEW

[view not recorded]
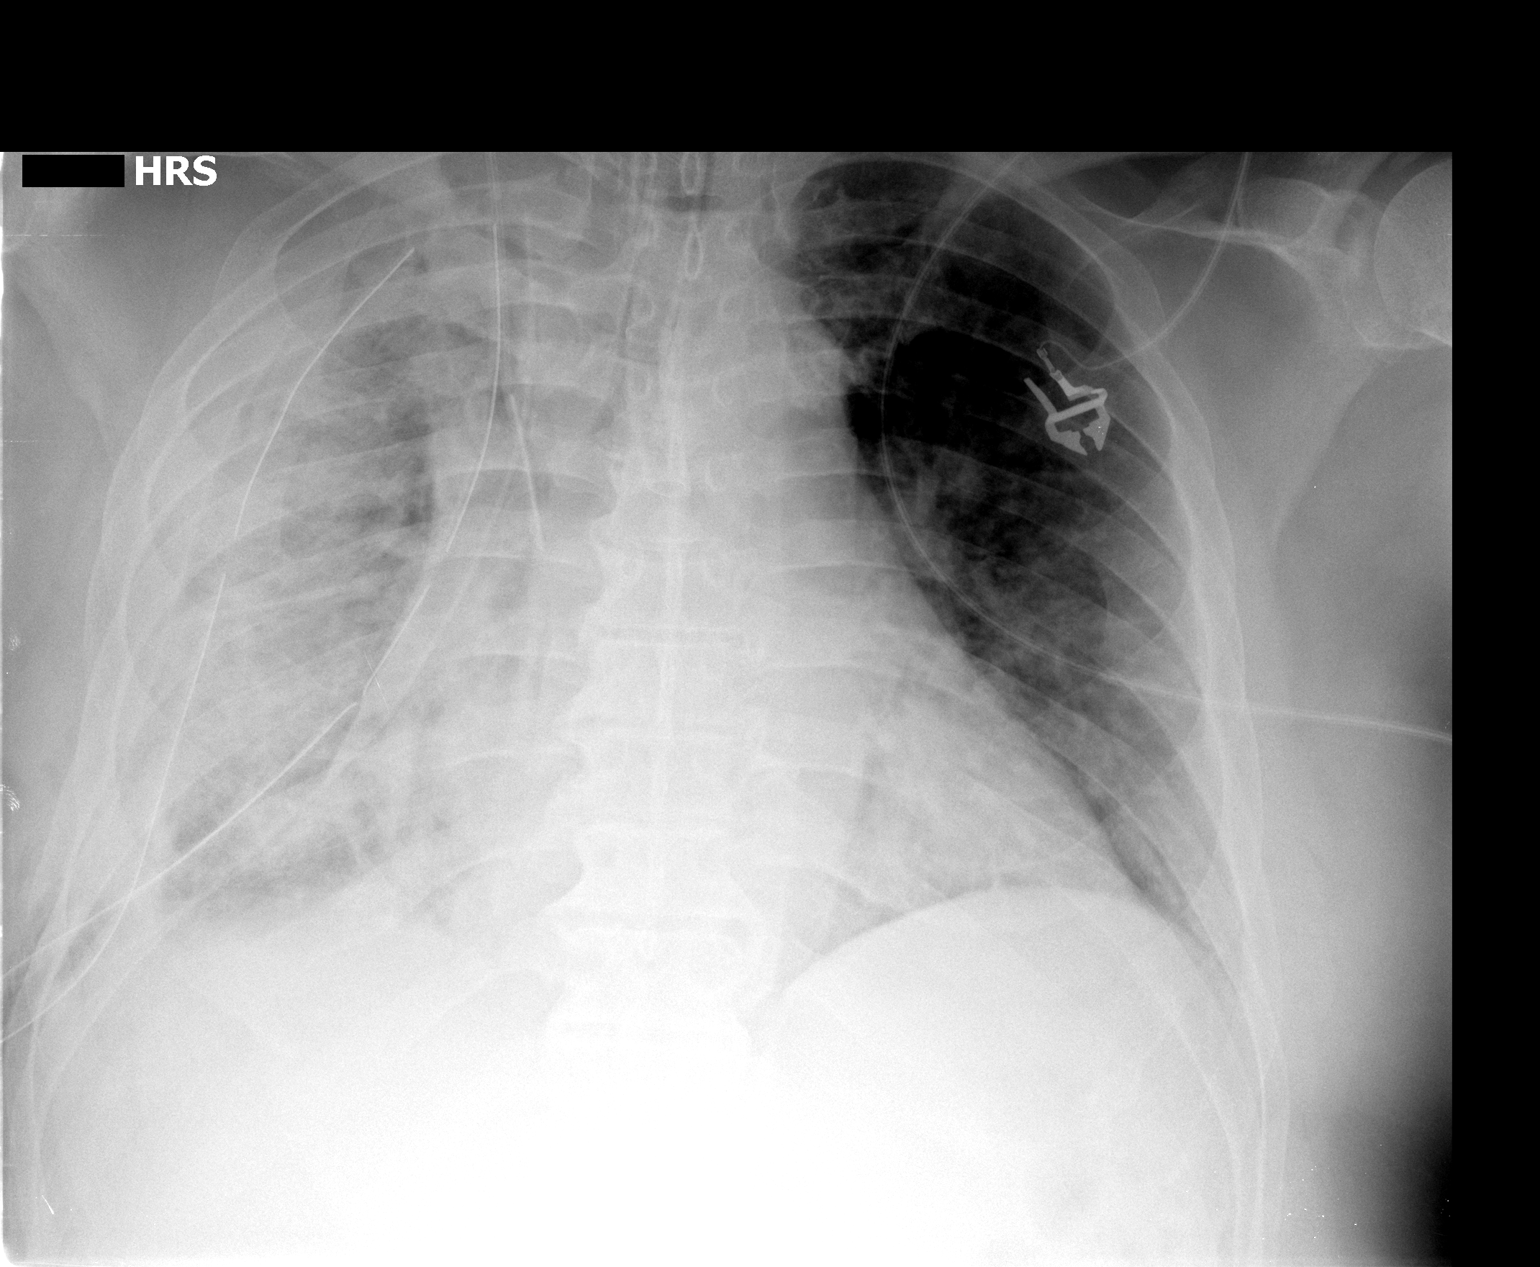

[1 of 1 positions shown; findings below may reference images not displayed]

FINDINGS: The patient has right-sided internal jugular central
venous line with tip to the level of the lower SVC.  There are two
right-sided chest tubes. There is no evidence for pneumothorax.
Right pleural effusion appears smaller.  Heart is enlarged.  There
is pulmonary vascular congestion.  Interstitial edema is present.
There are degenerative changes in the spine.  Endotracheal tube is
in place with tip in the proximal left main stem bronchus.
IMPRESSION: 1.  Left main stem intubation.
2.  Interval placement of right chest tubes and right internal
jugular central line.
3.  Smaller right pleural effusion.

Critical test results telephoned to BERNARD-LOUIS, the patient's nurse in
the [REDACTED] at the time of interpretation on [DATE] at [DATE] p.m.
The patient was extubated in the PACU.

## 2008-11-23 IMAGING — CR DG CHEST 1V PORT
1 series · 1 of 1 positions shown · non-contrast
Comparison: [DATE]

CLINICAL DATA: Right pleural effusion

PORTABLE CHEST - 1 VIEW

[view not recorded]
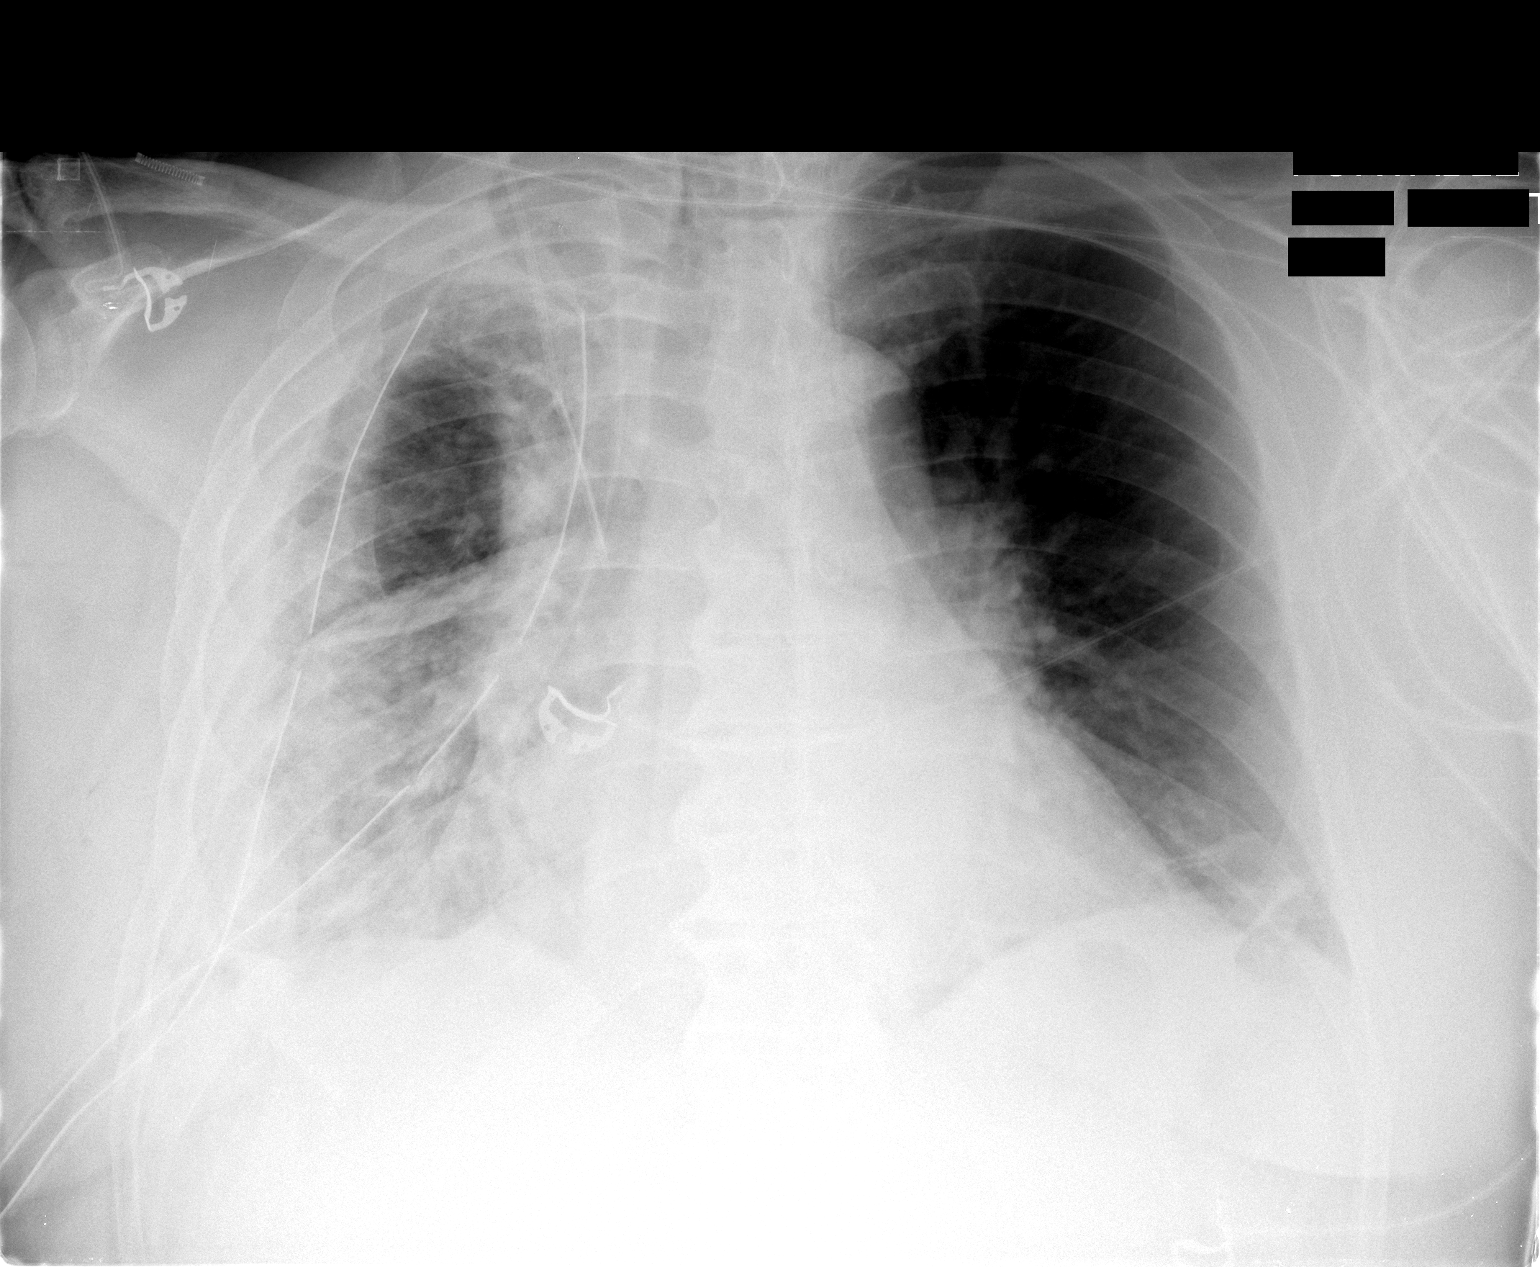

[1 of 1 positions shown; findings below may reference images not displayed]

FINDINGS: The patient has been extubated.  Two right chest tubes
remain in place.  No pneumothorax.  Extensive right pleural
parenchymal opacities redemonstrated without change.  Right IJ
sheath remains in place.
IMPRESSION: Follow-up extubation.  No significant change.

## 2008-11-24 IMAGING — CR DG CHEST 1V PORT
1 series · 1 of 1 positions shown · non-contrast
Comparison: [DATE]

CLINICAL DATA: Right-sided thoracotomy.

PORTABLE CHEST - 1 VIEW

[view not recorded]
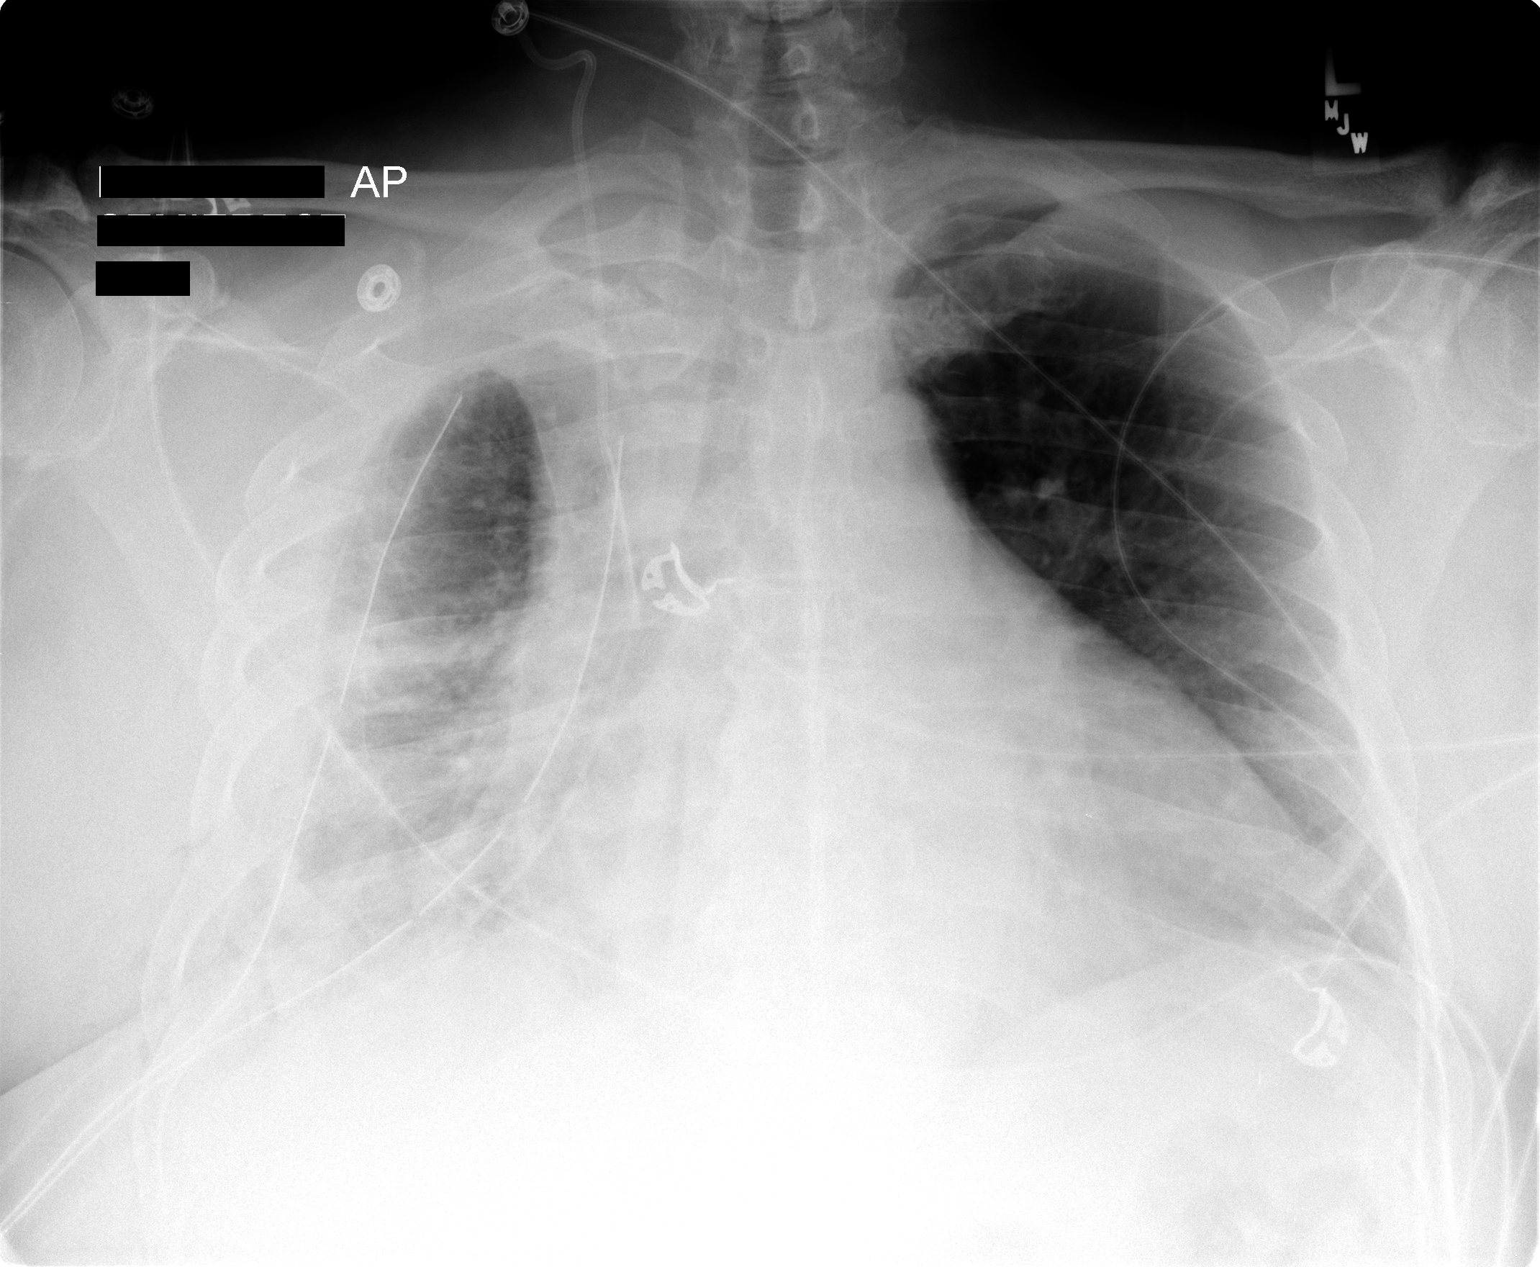

[1 of 1 positions shown; findings below may reference images not displayed]

FINDINGS: The right IJ catheter is stable.  Both the right-sided
chest tubes are stable.  There is persistent marked pleural
thickening and/or loculated pleural effusion along with significant
compressive atelectasis in the right lung.  The left lung is
relatively clear and stable.
IMPRESSION: 1.  Stable chest x-ray.  No change in extensive pleural thickening
or loculated pleural fluid on the right side.
2.  Stable support apparatus.
3.  The left lung remains clear except for streaky basilar
atelectasis.

## 2008-11-25 IMAGING — CR DG CHEST 1V PORT
1 series · 1 of 1 positions shown · non-contrast
Comparison: Yesterday's exam

CLINICAL DATA: Right thoracotomy.

PORTABLE CHEST - 1 VIEW

[AP]
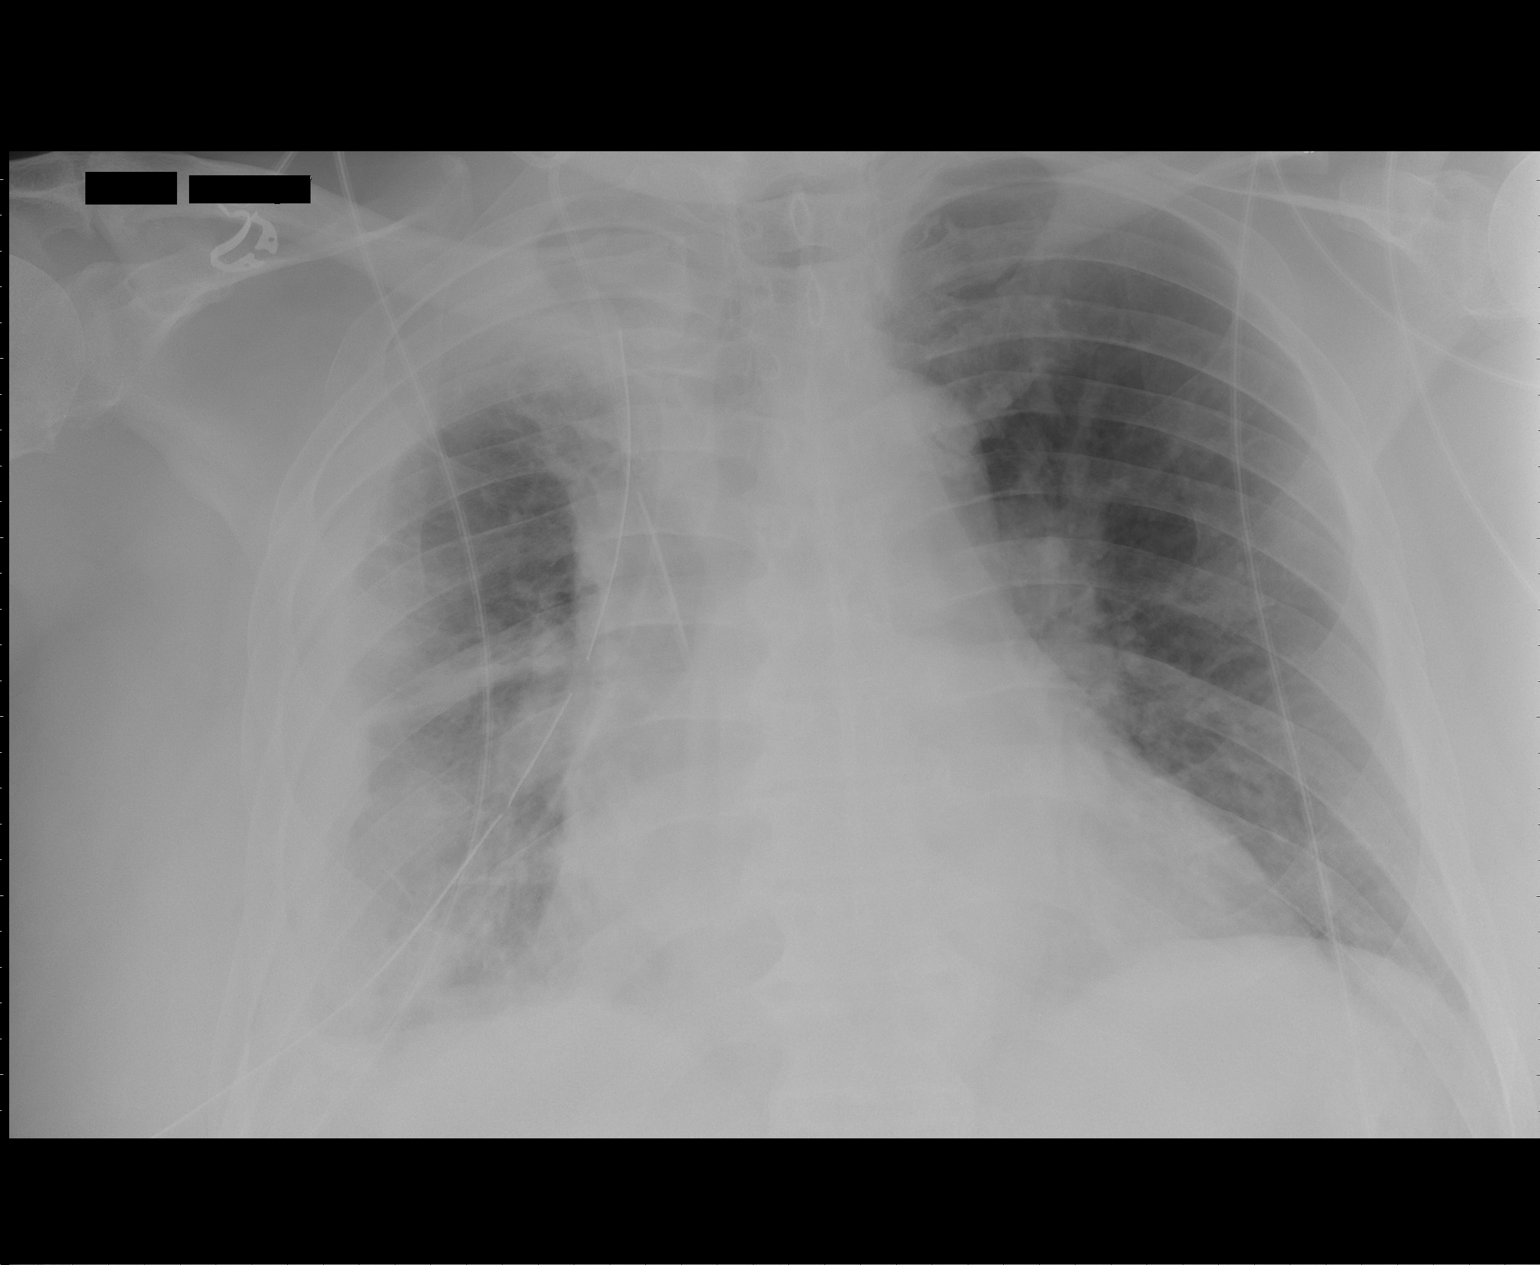

[1 of 1 positions shown; findings below may reference images not displayed]

FINDINGS: Extensive pleural thickening on the right is again noted.
Atelectatic changes right lower lung zone.  No pneumothorax.  One
of the right pleural chest tubes has been removed.  There is now a
single chest tube with tip near the apex of the right hemithorax.
Central venous catheter is in the SVC.
IMPRESSION: Right lung atelectatic changes, minimally improved.  Extensive
right pleural thickening.  No pneumothorax.

## 2008-11-26 IMAGING — CR DG CHEST 1V PORT
1 series · 1 of 1 positions shown · non-contrast
Comparison: [DATE]

CLINICAL DATA: Postop right pleural effusion.

PORTABLE CHEST - 1 VIEW - at [0L] hours:

[view not recorded]
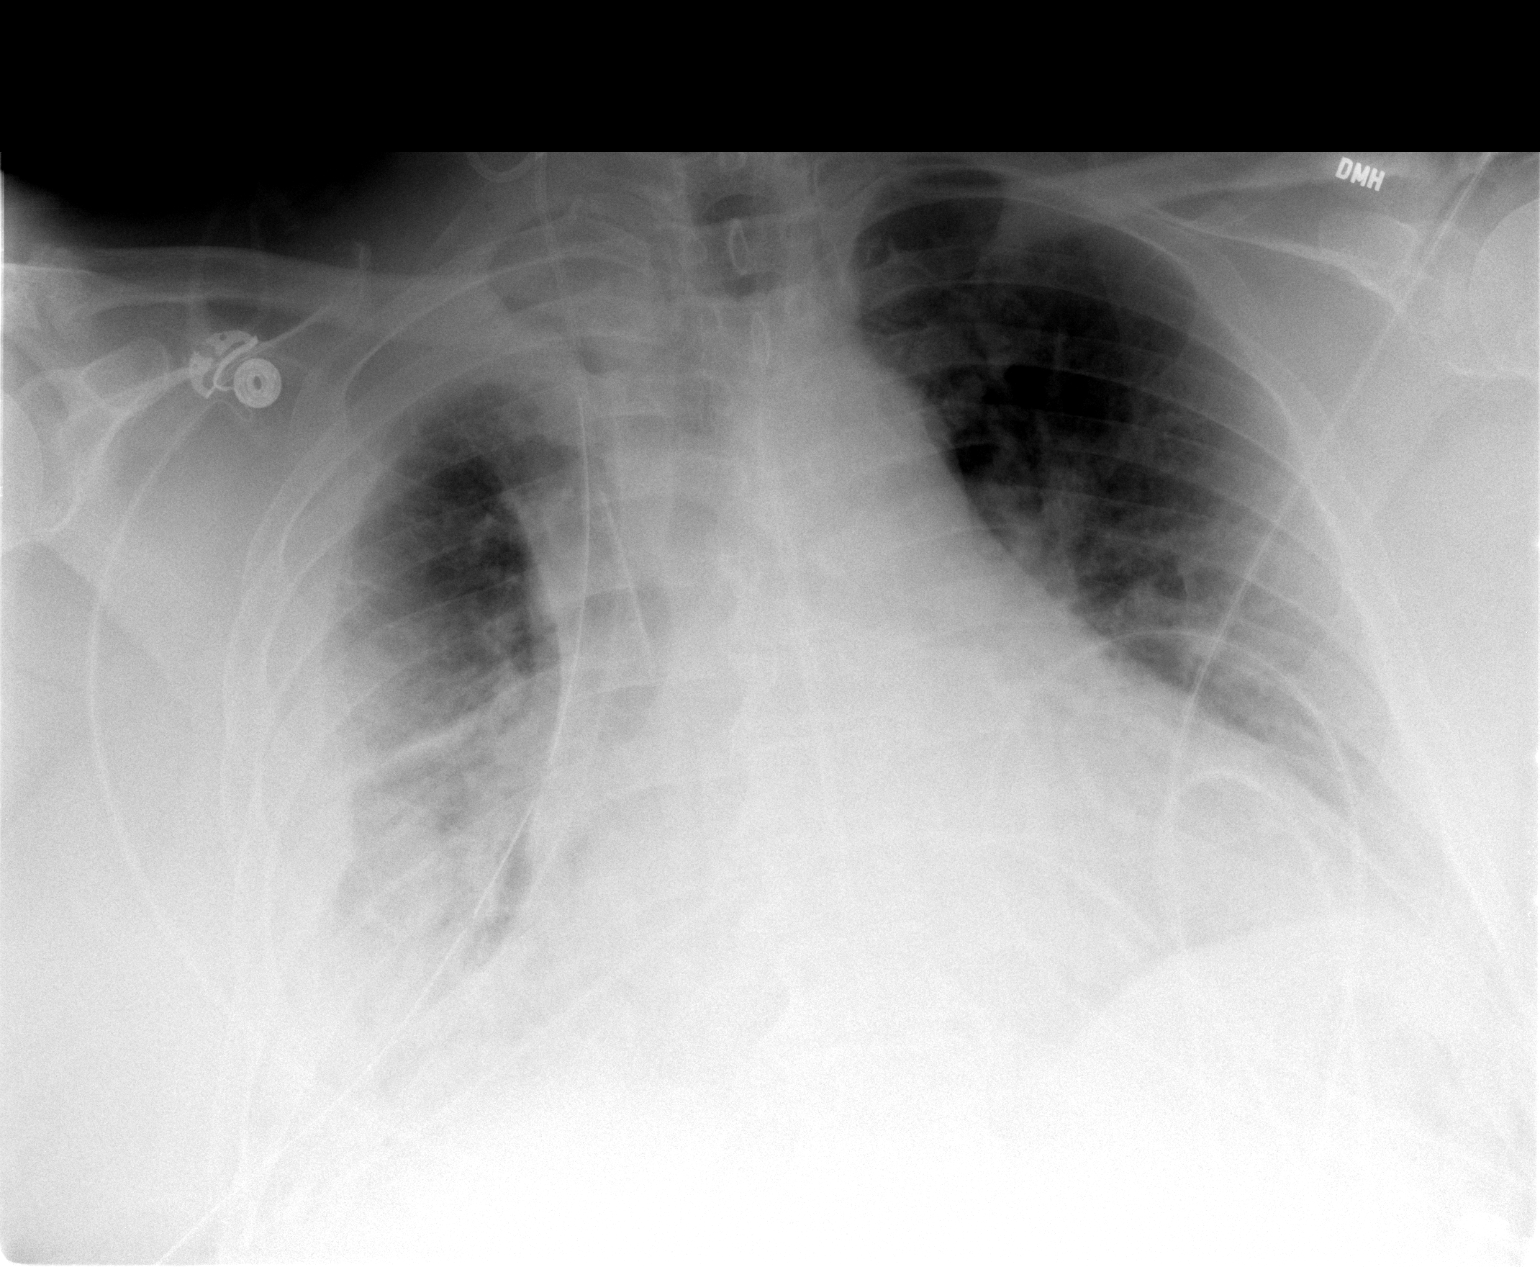

[1 of 1 positions shown; findings below may reference images not displayed]

FINDINGS: Right pleural effusion is again appreciated paralleling
the right lateral chest wall from the apex to the lateral
costophrenic angle.  Streaky atelectatic densities at the right
lung base.  Cardiomegaly.  No pneumothorax.  Central venous
catheter tip is in the mid SVC.  Right pleural chest tube is in
position.
IMPRESSION: No significant change in right pleural effusion and right lower
lung zone atelectatic densities.  No pneumothorax.

## 2008-11-26 IMAGING — CR DG CHEST 1V PORT
1 series · 1 of 1 positions shown · non-contrast
Comparison: [DATE]

CLINICAL DATA: Chest tube removal.

PORTABLE CHEST - 1 VIEW

[AP]
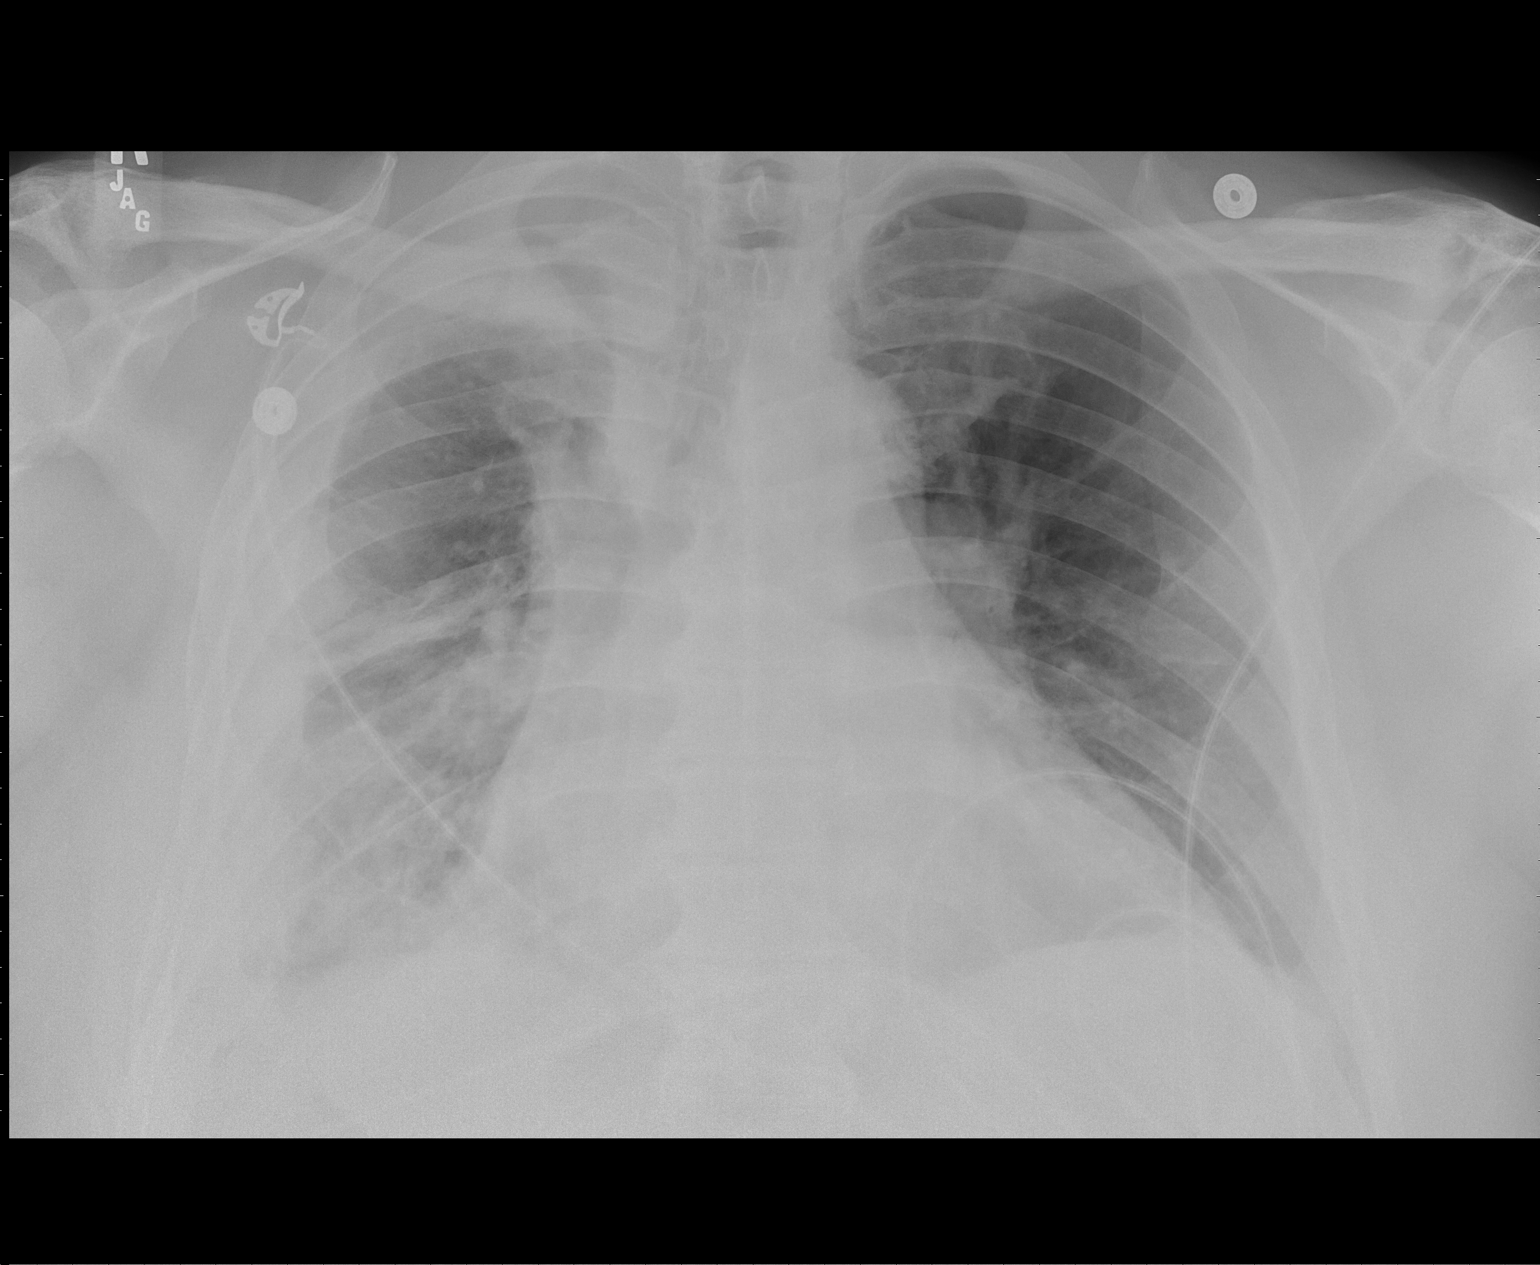

[1 of 1 positions shown; findings below may reference images not displayed]

FINDINGS: Interval removal of right central line and right chest
tube.  No pneumothorax.  Moderate right pleural effusions persist,
unchanged.  Patchy right lung airspace disease also unchanged.
There is stable cardiomegaly.
IMPRESSION: Interval removal of right central line and right chest tube.  No
pneumothorax.

Otherwise no change.

## 2008-11-27 IMAGING — CR DG CHEST 2V
2 series · 2 of 2 positions shown · non-contrast
Comparison: [DATE]

CLINICAL DATA: Follow up chest tube removal

CHEST - 2 VIEW

[w chest pa]
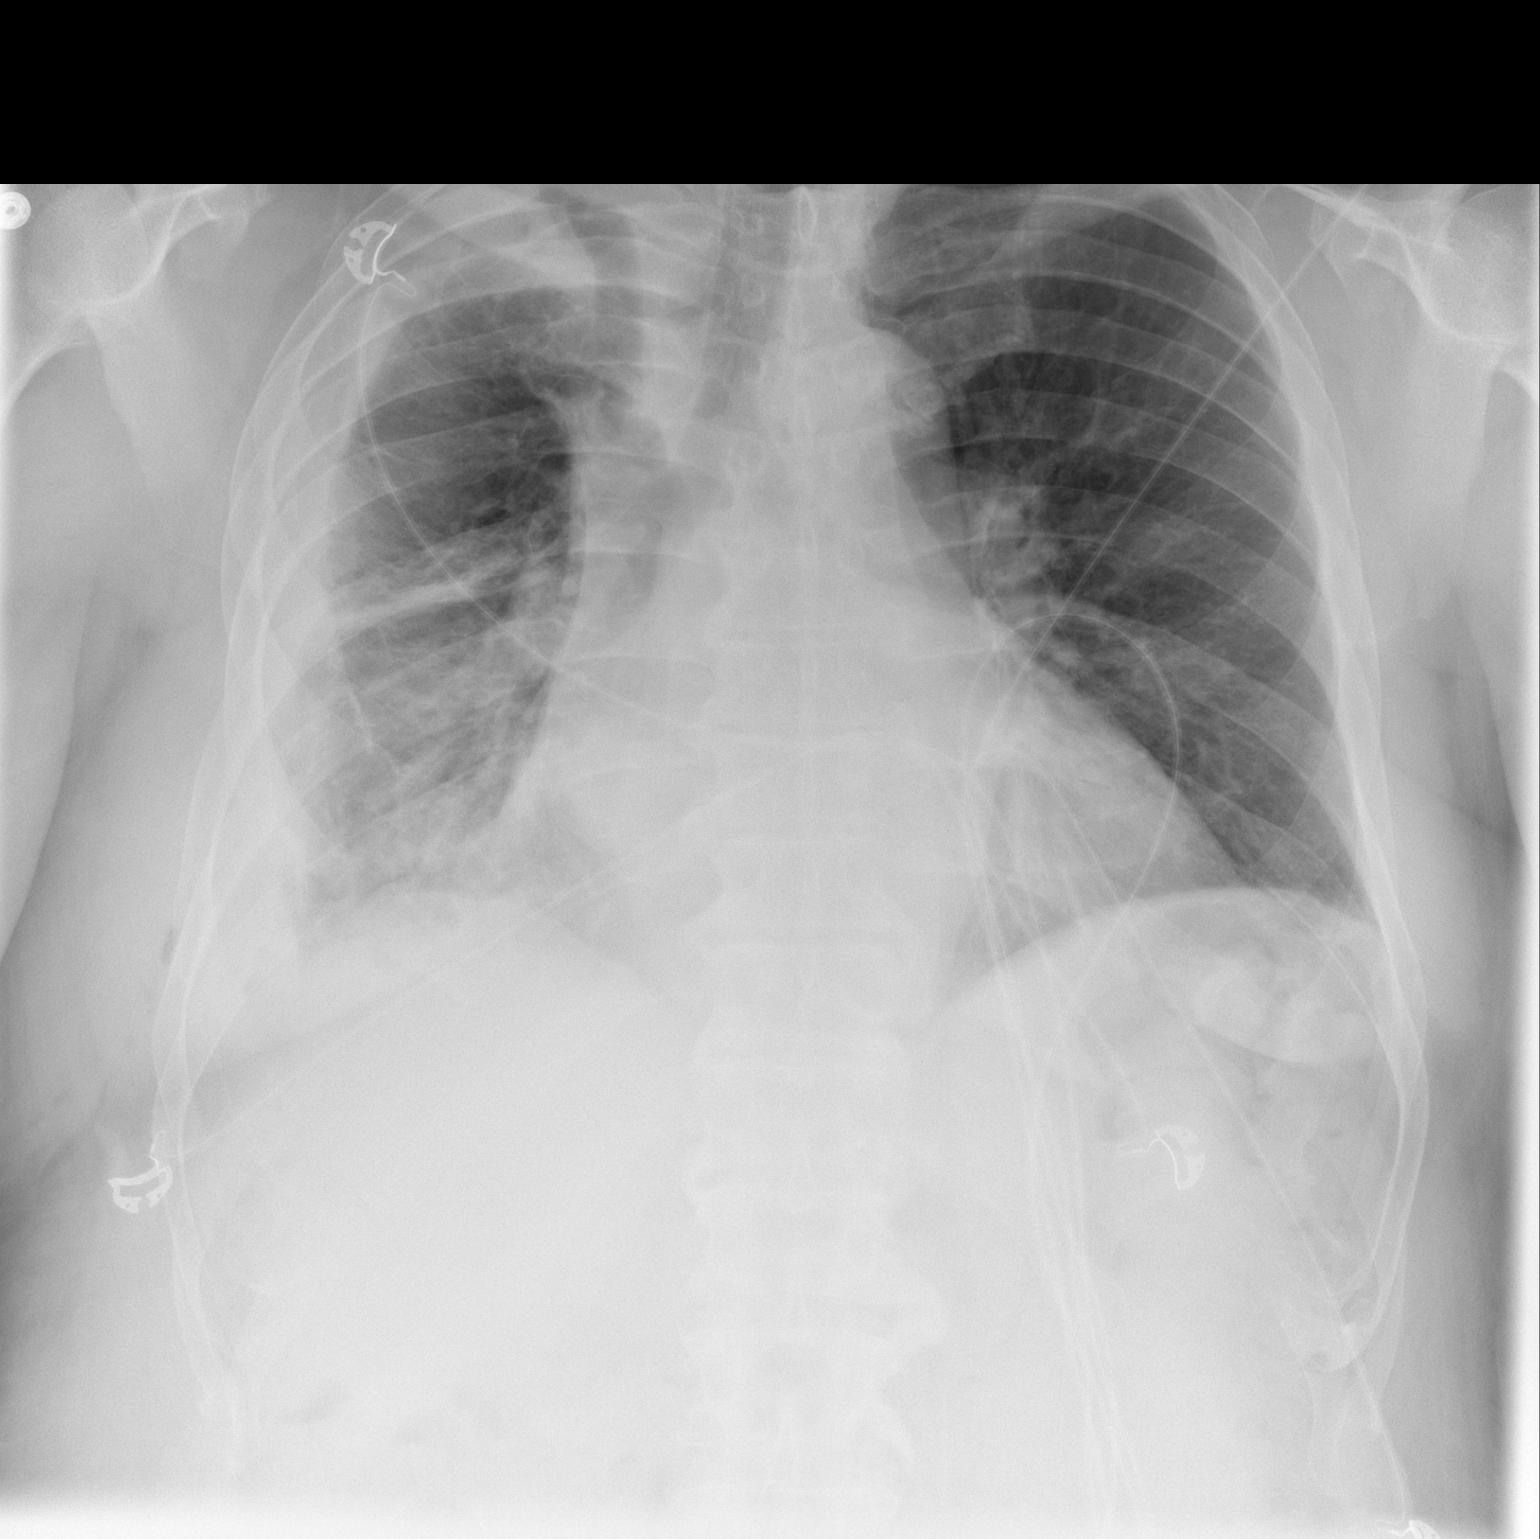

[w chest lat]
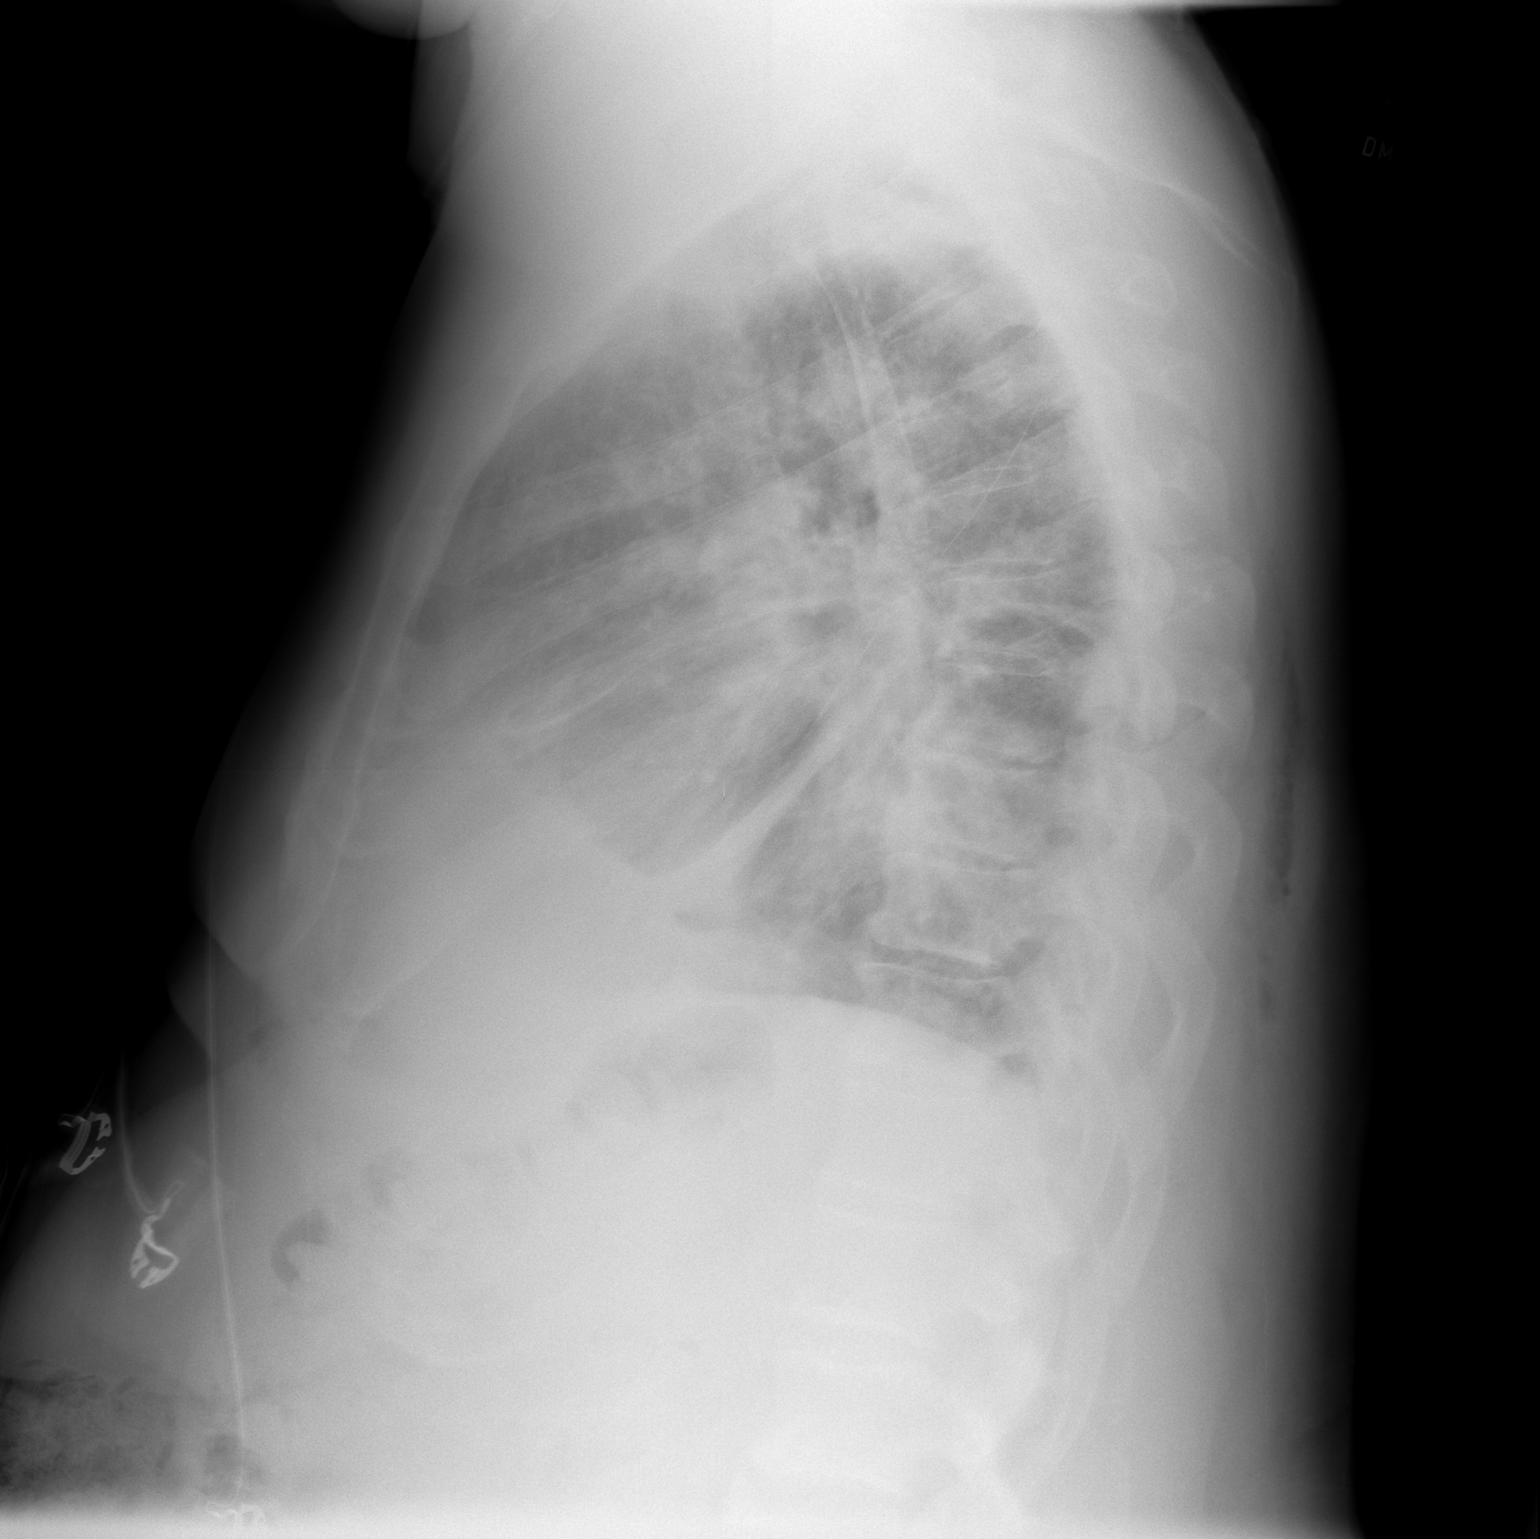

[2 of 2 positions shown; findings below may reference images not displayed]

FINDINGS: Large right effusion with pleural parenchymal opacities,
unchanged in the interval.  The left lung remains clear.  Cardiac
enlargement.
IMPRESSION: No acute change.  No pneumothorax.

## 2008-12-03 ENCOUNTER — Ambulatory Visit: Payer: Self-pay | Admitting: Surgery

## 2008-12-15 ENCOUNTER — Ambulatory Visit: Payer: Self-pay | Admitting: Surgery

## 2008-12-15 ENCOUNTER — Encounter: Admission: RE | Admit: 2008-12-15 | Discharge: 2008-12-15 | Payer: Self-pay | Admitting: Surgery

## 2008-12-15 IMAGING — CR DG CHEST 2V
2 series · 2 of 2 positions shown · non-contrast
Comparison: [DATE]

CLINICAL DATA: Post surgery for empyema/short of breath

CHEST - 2 VIEW

[w chest pa]
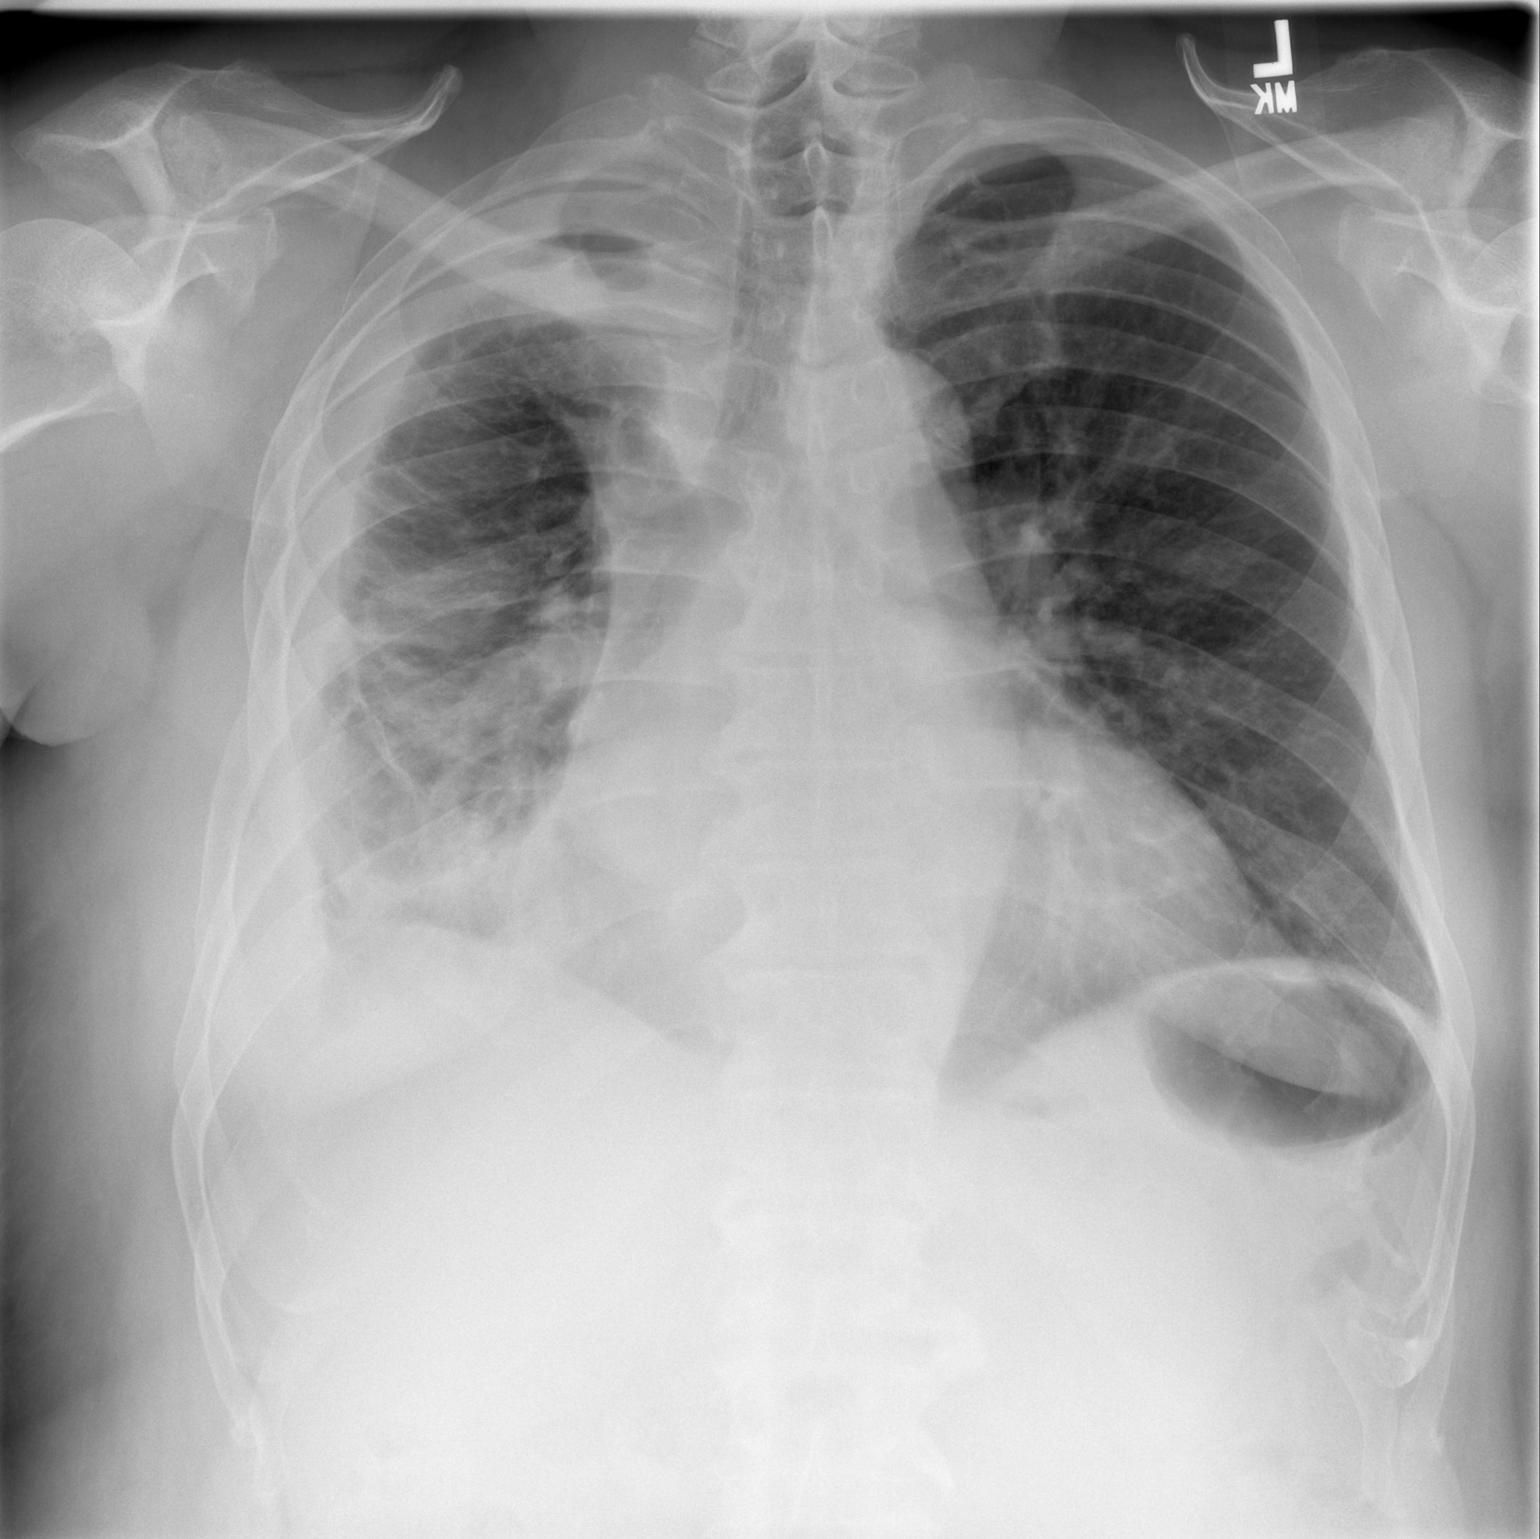

[w chest lat]
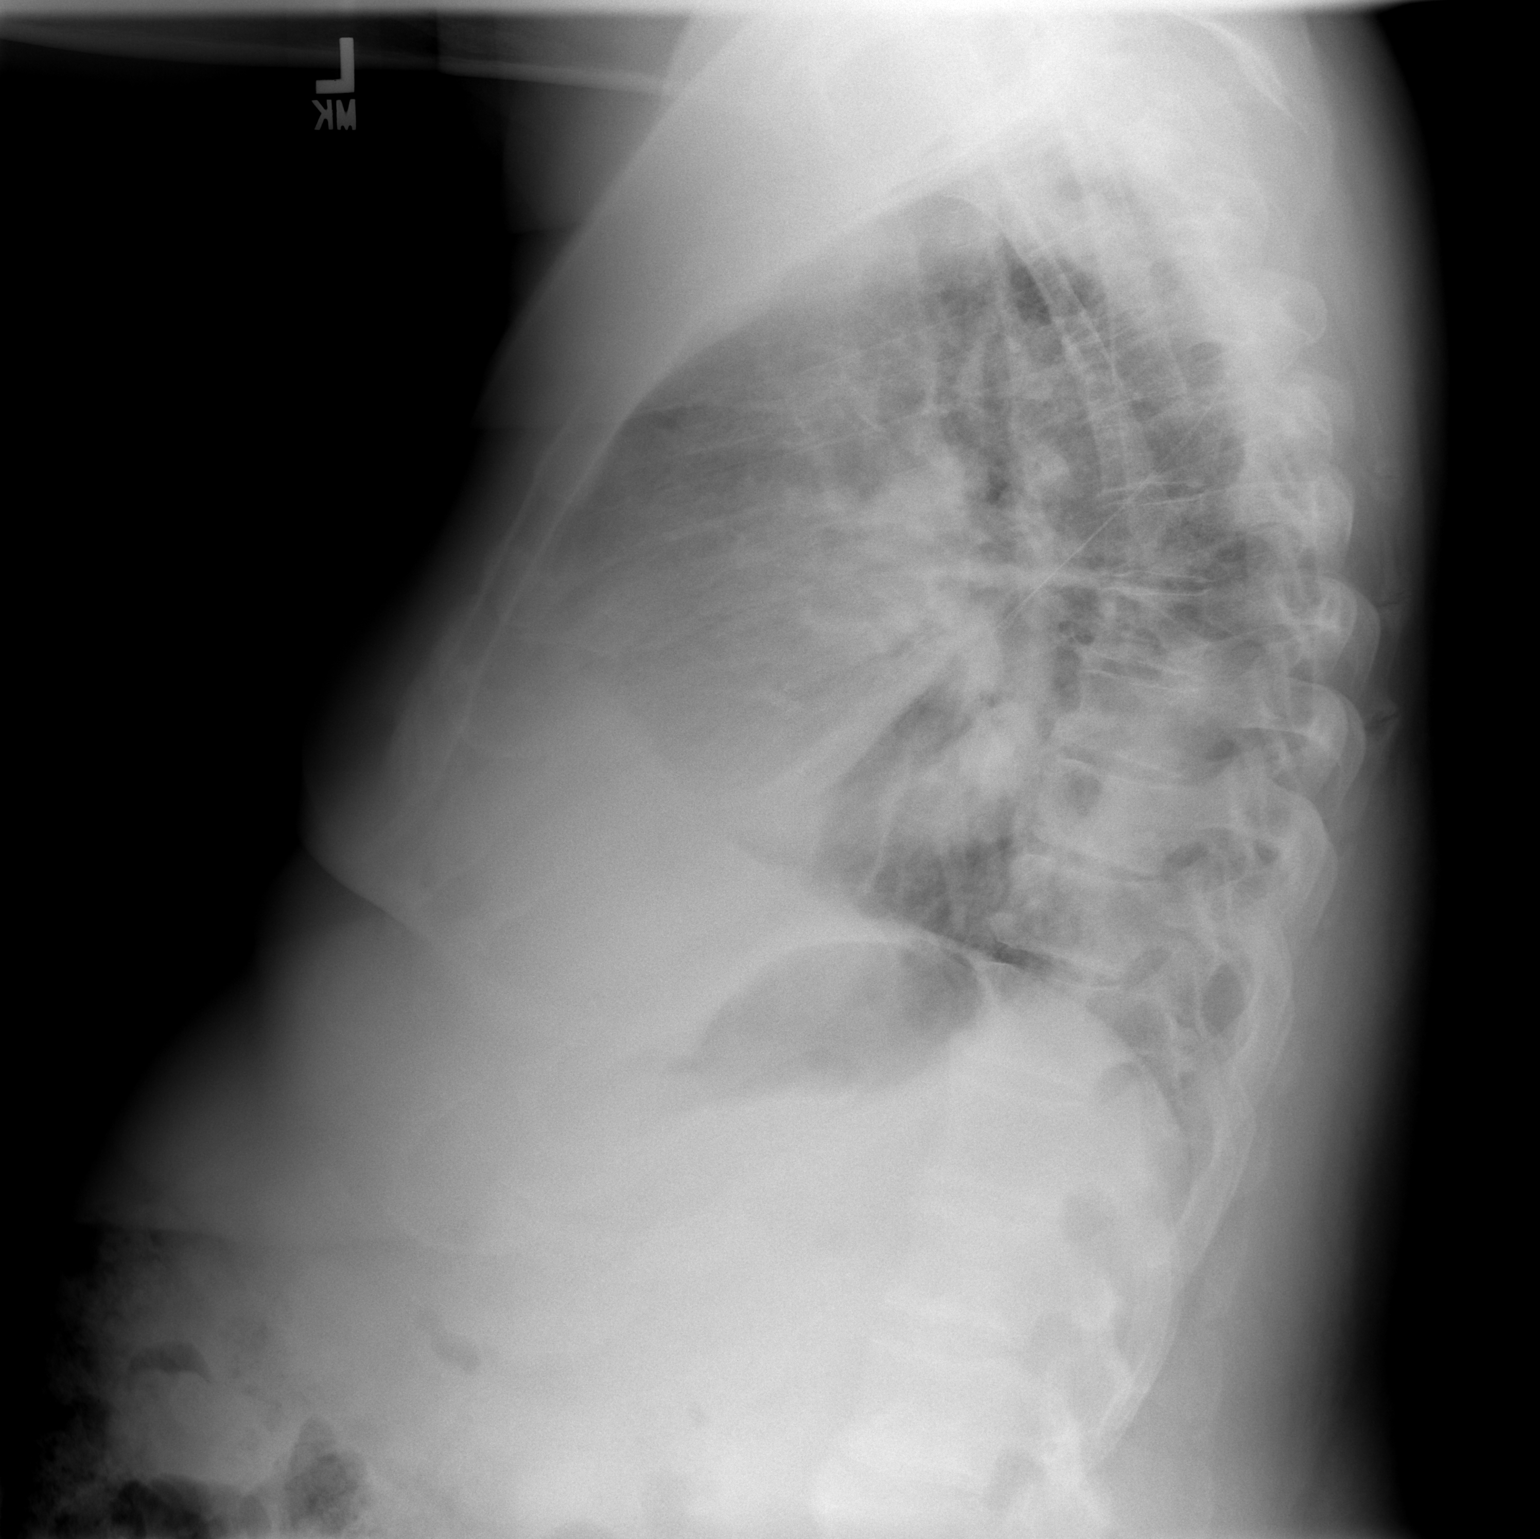

[2 of 2 positions shown; findings below may reference images not displayed]

FINDINGS: Heart enlarged but no congestive heart failure or
interval change.  Persistent right pleural effusion and/or pleural
thickening.  No obvious interval change.  Left lung clear.  Osseous
structures intact.
IMPRESSION: Stable right pleural effusion or pleural thickening - no obvious
acute findings or significant interval change.

## 2008-12-22 ENCOUNTER — Ambulatory Visit: Payer: Self-pay | Admitting: Internal Medicine

## 2008-12-22 LAB — CONVERTED CEMR LAB: Protime: 13

## 2008-12-31 ENCOUNTER — Ambulatory Visit: Payer: Self-pay | Admitting: Internal Medicine

## 2008-12-31 ENCOUNTER — Telehealth: Payer: Self-pay | Admitting: Internal Medicine

## 2008-12-31 ENCOUNTER — Telehealth (INDEPENDENT_AMBULATORY_CARE_PROVIDER_SITE_OTHER): Payer: Self-pay | Admitting: *Deleted

## 2008-12-31 DIAGNOSIS — I4821 Permanent atrial fibrillation: Secondary | ICD-10-CM

## 2008-12-31 DIAGNOSIS — I4891 Unspecified atrial fibrillation: Secondary | ICD-10-CM | POA: Insufficient documentation

## 2008-12-31 DIAGNOSIS — G4733 Obstructive sleep apnea (adult) (pediatric): Secondary | ICD-10-CM | POA: Insufficient documentation

## 2008-12-31 DIAGNOSIS — I10 Essential (primary) hypertension: Secondary | ICD-10-CM

## 2008-12-31 LAB — CONVERTED CEMR LAB
BUN: 12 mg/dL (ref 6–23)
GFR calc non Af Amer: 99 mL/min (ref >60)
POC INR: 1.1
Potassium: 4.6 meq/L (ref 3.5–5.1)
Prothrombin Time: 13.2 s

## 2009-01-07 ENCOUNTER — Inpatient Hospital Stay (HOSPITAL_COMMUNITY): Admission: EM | Admit: 2009-01-07 | Discharge: 2009-01-09 | Payer: Self-pay | Admitting: Emergency Medicine

## 2009-01-07 ENCOUNTER — Ambulatory Visit: Payer: Self-pay | Admitting: Cardiology

## 2009-01-07 IMAGING — CT CT ANGIO CHEST
1 of 2 series · 19 of 32 positions shown · IV contrast (APPLIED)
Comparison: CXR today.  CT [DATE]

CLINICAL DATA: Chest pain.  Reason for exam:  Rule out PE.

CT ANGIOGRAPHY CHEST WITH CONTRAST
TECHNIQUE: Multidetector CT imaging of the chest was performed
using the standard protocol during bolus administration of
intravenous contrast. Multiplanar CT image reconstructions
including MIPs were obtained to evaluate the vascular anatomy.
Contrast: 100 ml [PW] IV.

[Series 5: pe thins @ 1mm · axial · 0.76mm/px · z∈[-279,-26]mm · 19 of 279 slices shown]
[im 13/279  lung]
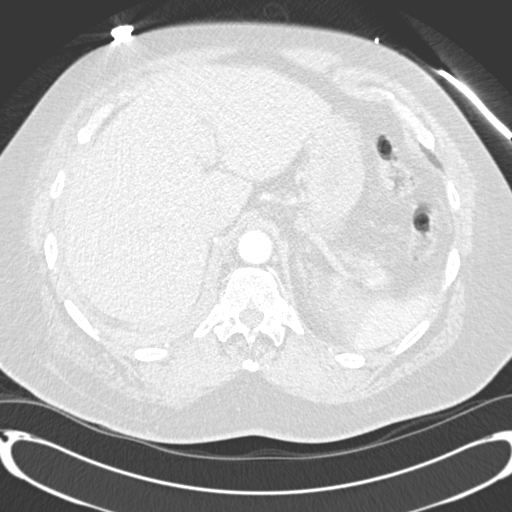
[im 25/279  soft-tissue]
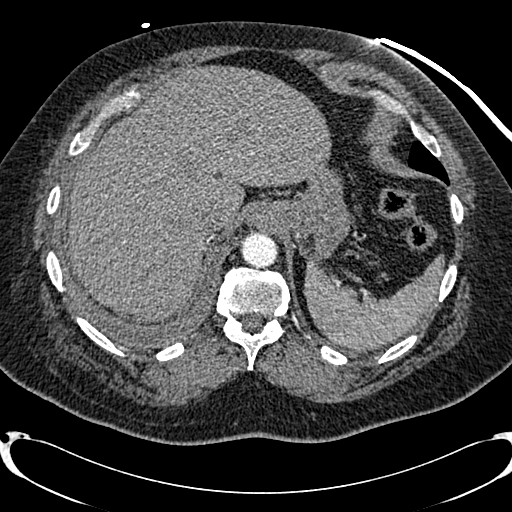
[im 37/279  lung]
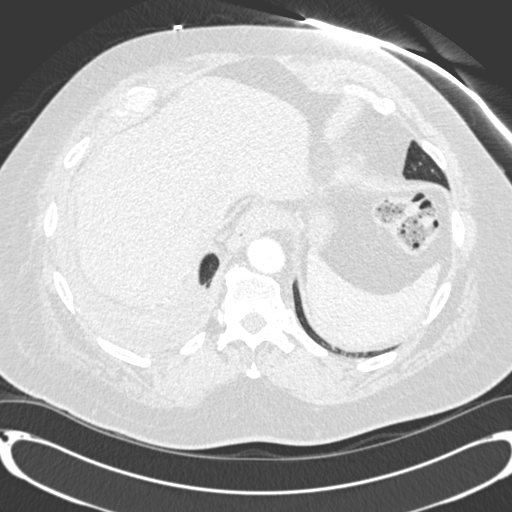
[im 61/279  soft-tissue]
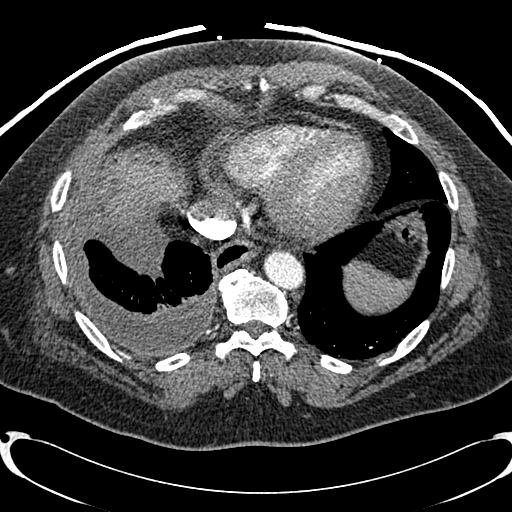
[im 73/279  lung]
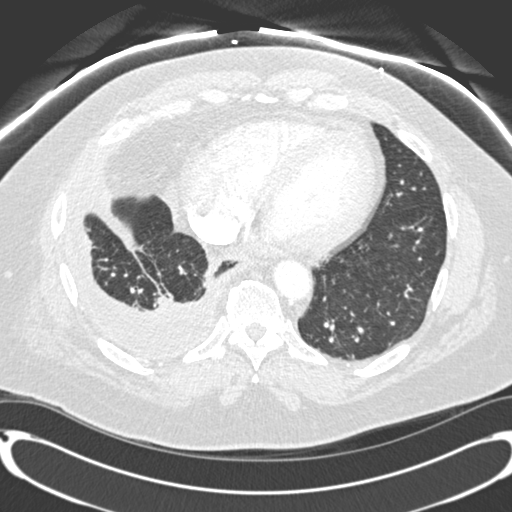
[im 85/279  soft-tissue]
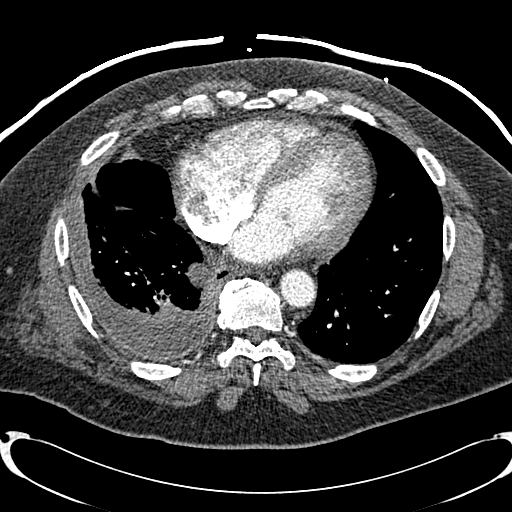
[im 97/279  lung]
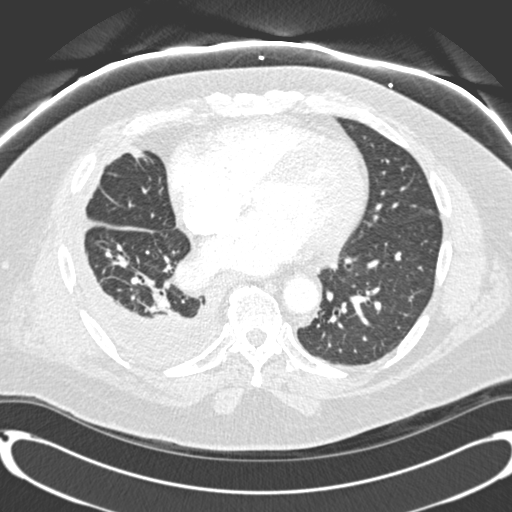
[im 109/279  soft-tissue]
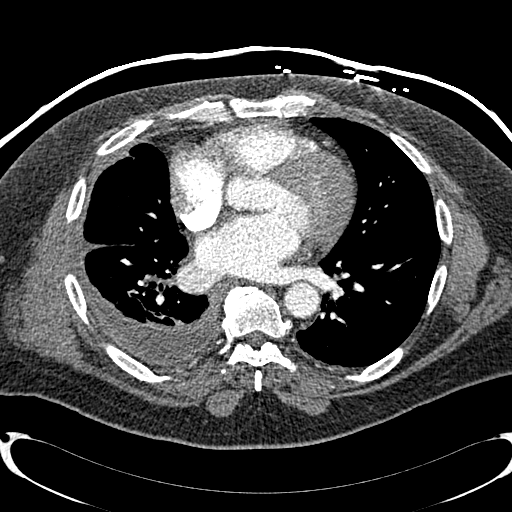
[im 121/279  lung]
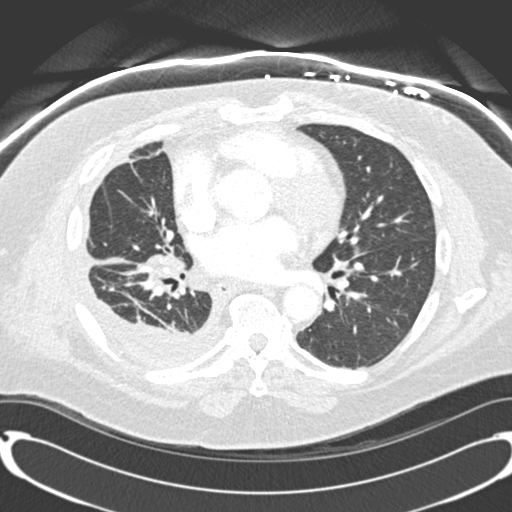
[im 146/279  soft-tissue]
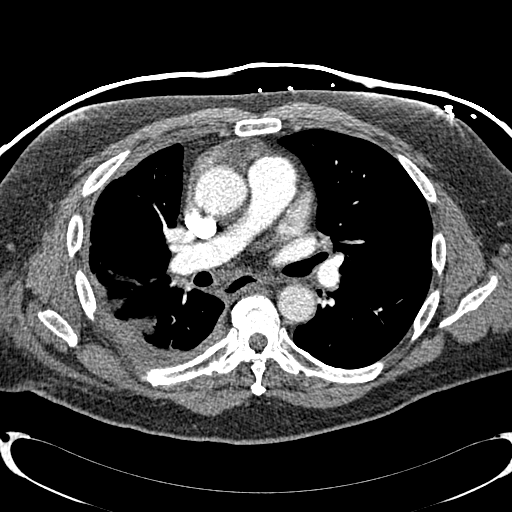
[im 158/279  lung]
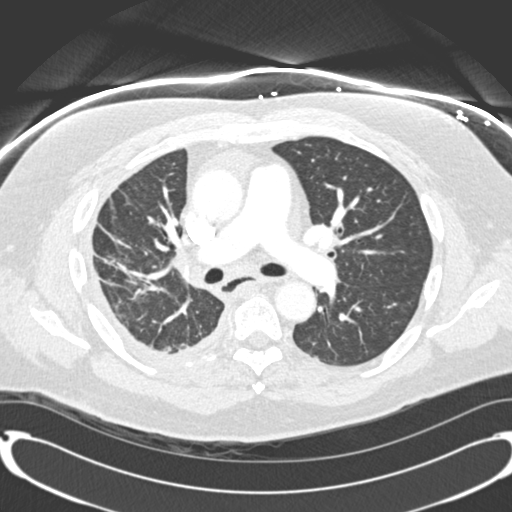
[im 170/279  soft-tissue]
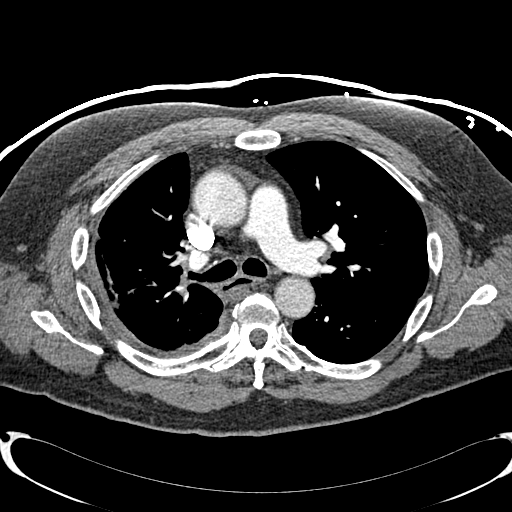
[im 182/279  lung]
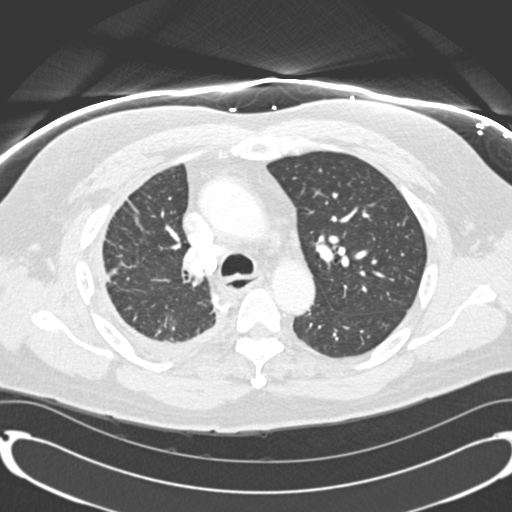
[im 194/279  soft-tissue]
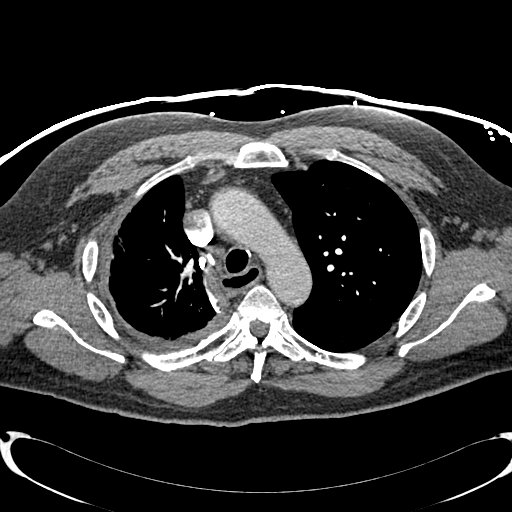
[im 206/279  lung]
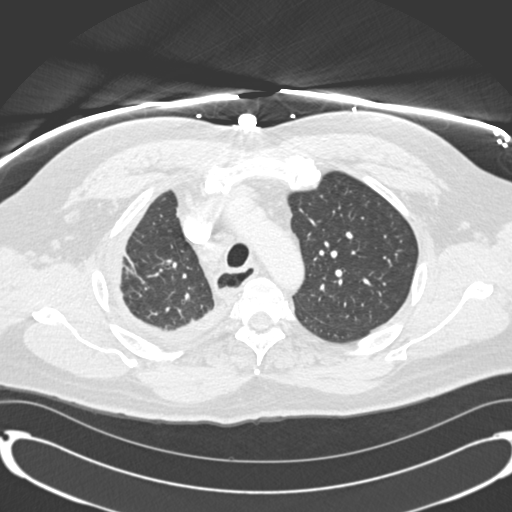
[im 218/279  soft-tissue]
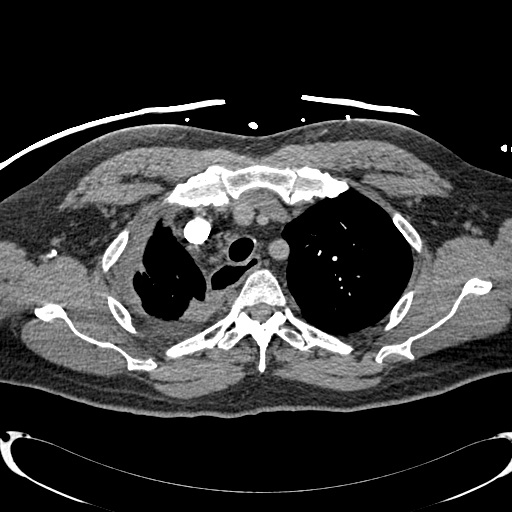
[im 242/279  lung]
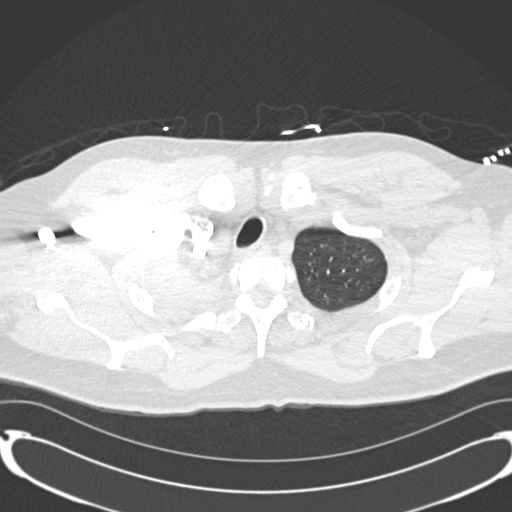
[im 254/279  soft-tissue]
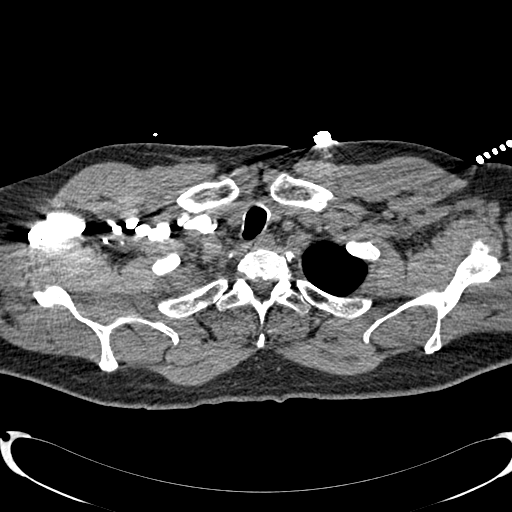
[im 266/279  lung]
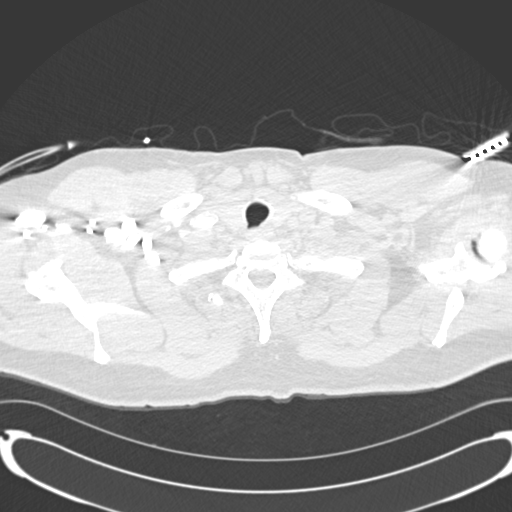

[19 of 32 positions shown; findings below may reference images not displayed]

FINDINGS: Again appreciated is pleural fluid and thickening on the
right.  There is thickening of the pleura surrounding the fluid
most noticeable in the right lower chest.  Findings may represent
an evolving fibrothorax.  Empyema cannot be excluded.  The mild
mediastinal adenopathy has not changed significantly. Also no
change in right hilar adenopathy.  These may represent
hyperplastic/reactive nodes.  Prominent caliber of the main and
central pulmonary arteries with main pulmonary trunk measuring
cm may reflect pulmonary arterial hypertension.  Negative for PE.
No intraluminal filling defects within the pulmonary arteries.
Subsegmental atelectatic changes of the right lung.

Review of the MIP images confirms the above findings.
IMPRESSION: Specifically, this exam is negative for PE. PAH. Chronic right
pleural effusion with loculation and surrounding pleural thickening
which has increased in degree.  Findings may represent an evolving
fibrothorax.  Empyema not excluded.  Mediastinal and right hilar
adenopathy are again noted.

## 2009-01-07 IMAGING — CR DG CHEST 1V PORT
1 series · 1 of 1 positions shown · non-contrast
Comparison: [DATE]

CLINICAL DATA: Chest pain

PORTABLE CHEST - 1 VIEW

[view not recorded]
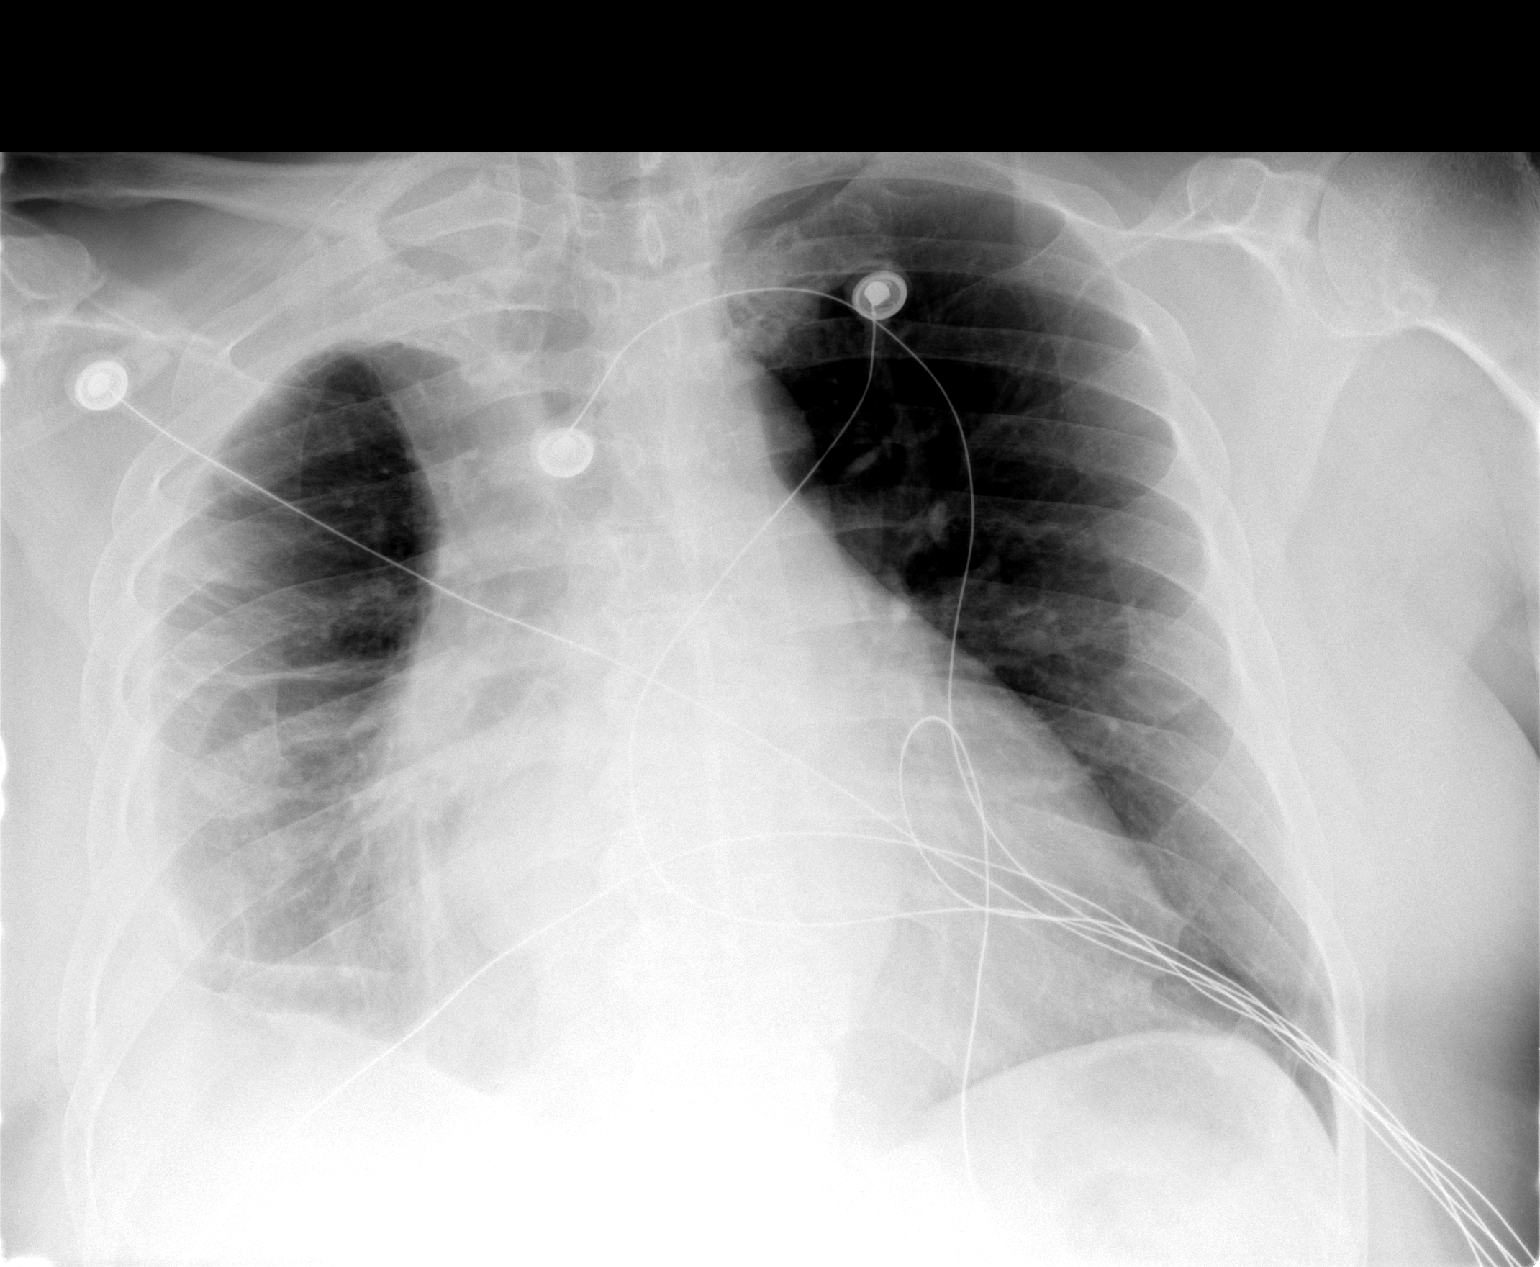

[1 of 1 positions shown; findings below may reference images not displayed]

FINDINGS: Right pleural effusion and shows improvement.  There
remains pleural thickening on the right which is likely due to
scarring and effusion.  There is also scarring in the right lung.
There is improvement in aeration on the right with less atelectasis
or infiltrate.

The left lung is clear.  The heart is enlarged.  There is no heart
failure.
IMPRESSION: Overall improvement in aeration on the right since the prior study.
There remains some right pleural effusion and thickening as well as
improvement in right lung airspace density.

## 2009-01-08 ENCOUNTER — Ambulatory Visit: Payer: Self-pay | Admitting: Thoracic Surgery

## 2009-01-14 ENCOUNTER — Ambulatory Visit: Payer: Self-pay | Admitting: Internal Medicine

## 2009-01-14 DIAGNOSIS — I5032 Chronic diastolic (congestive) heart failure: Secondary | ICD-10-CM

## 2009-01-14 LAB — CONVERTED CEMR LAB
POC INR: 1.3
Prothrombin Time: 14.2 s

## 2009-01-15 LAB — CONVERTED CEMR LAB
BUN: 13 mg/dL (ref 6–23)
Creatinine, Ser: 1 mg/dL (ref 0.4–1.5)
GFR calc non Af Amer: 98.99 mL/min (ref >60)
Potassium: 3.9 meq/L (ref 3.5–5.1)

## 2009-01-20 ENCOUNTER — Telehealth: Payer: Self-pay | Admitting: Internal Medicine

## 2009-01-29 ENCOUNTER — Ambulatory Visit: Payer: Self-pay | Admitting: Cardiovascular Disease

## 2009-02-02 ENCOUNTER — Emergency Department (HOSPITAL_COMMUNITY): Admission: EM | Admit: 2009-02-02 | Discharge: 2009-02-02 | Payer: Self-pay | Admitting: Emergency Medicine

## 2009-02-02 IMAGING — CR DG CHEST 2V
2 series · 2 of 2 positions shown · non-contrast
Comparison: [DATE] chest CT scan and/or the chest.

CLINICAL DATA: Shortness of breath.

CHEST - 2 VIEW

[w chest pa]
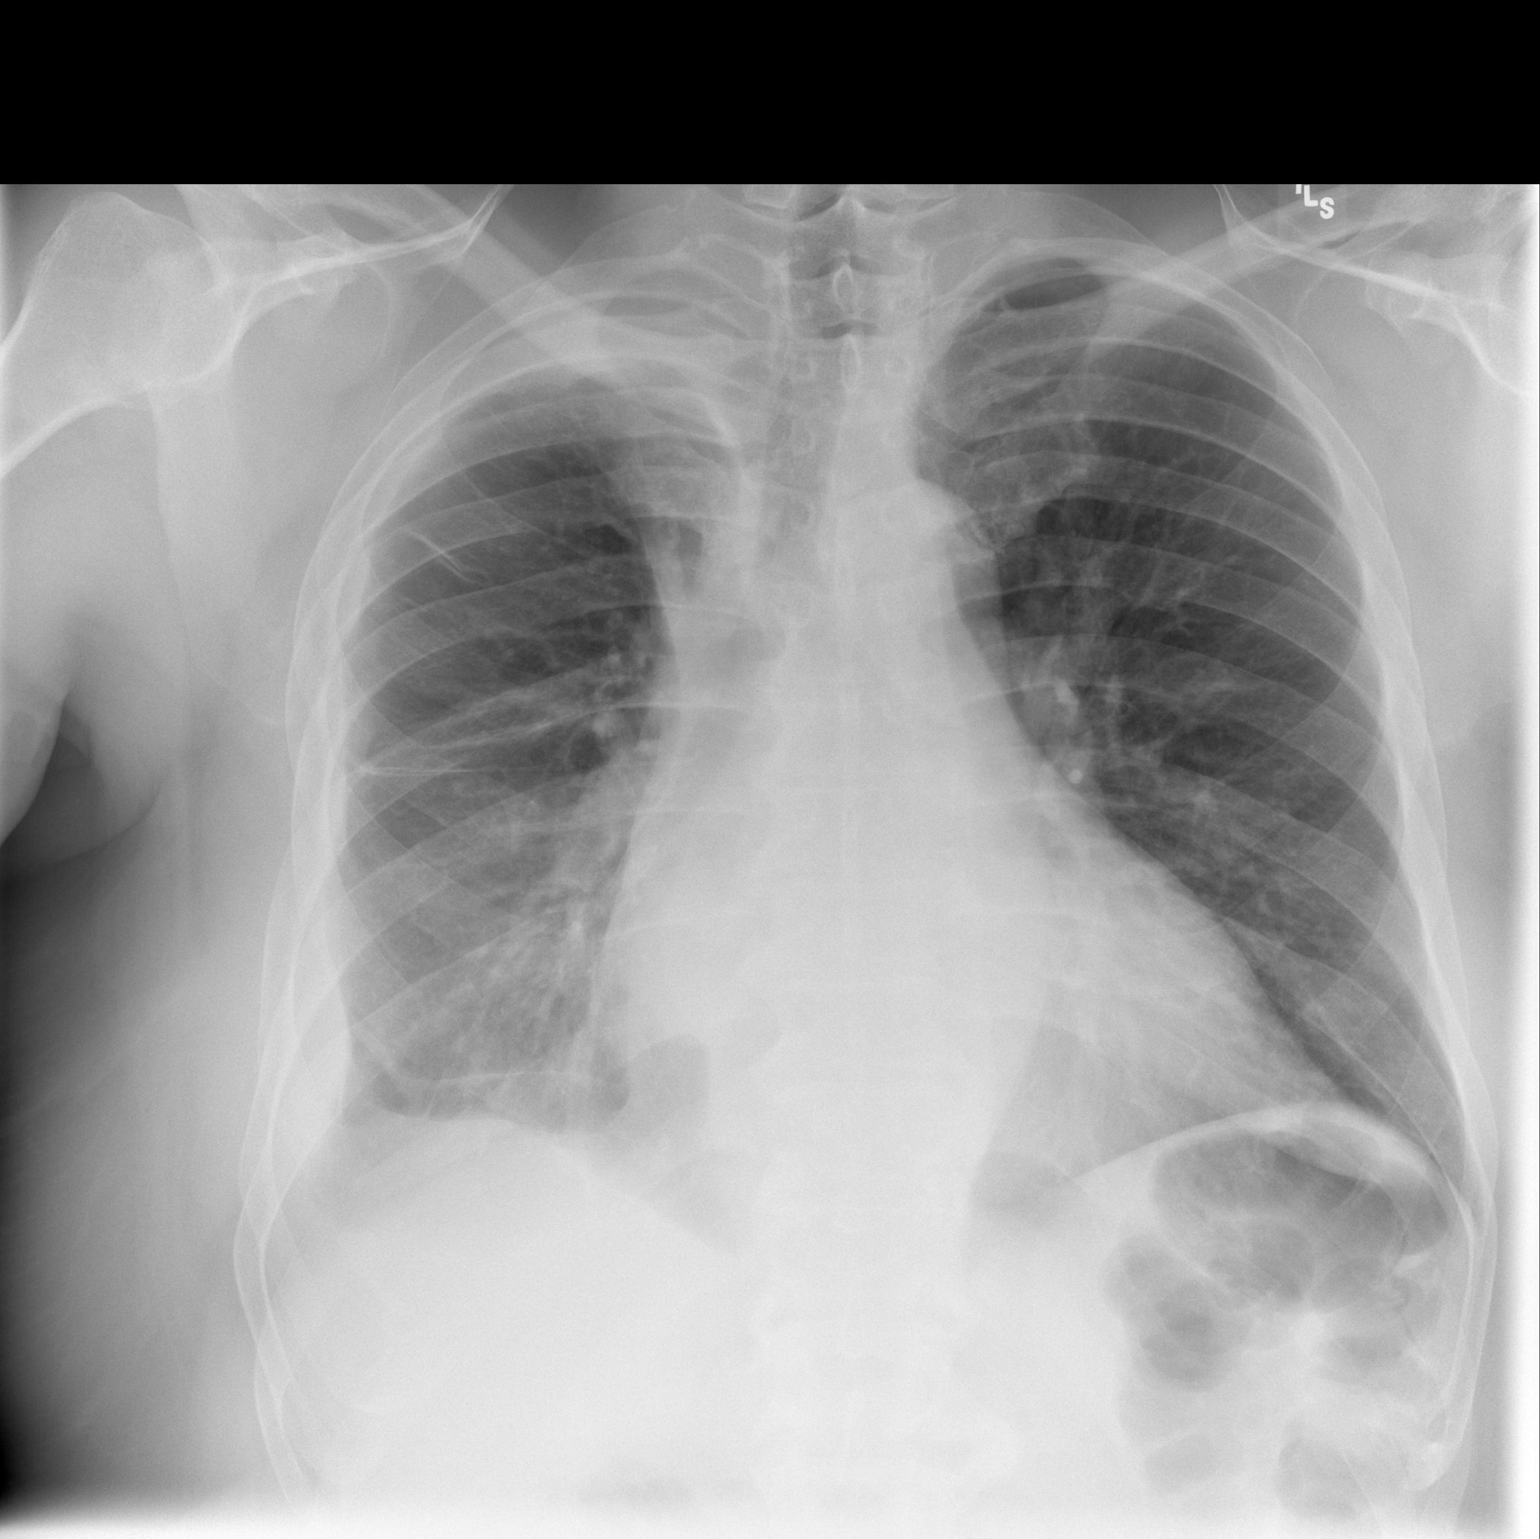

[w chest lat *]
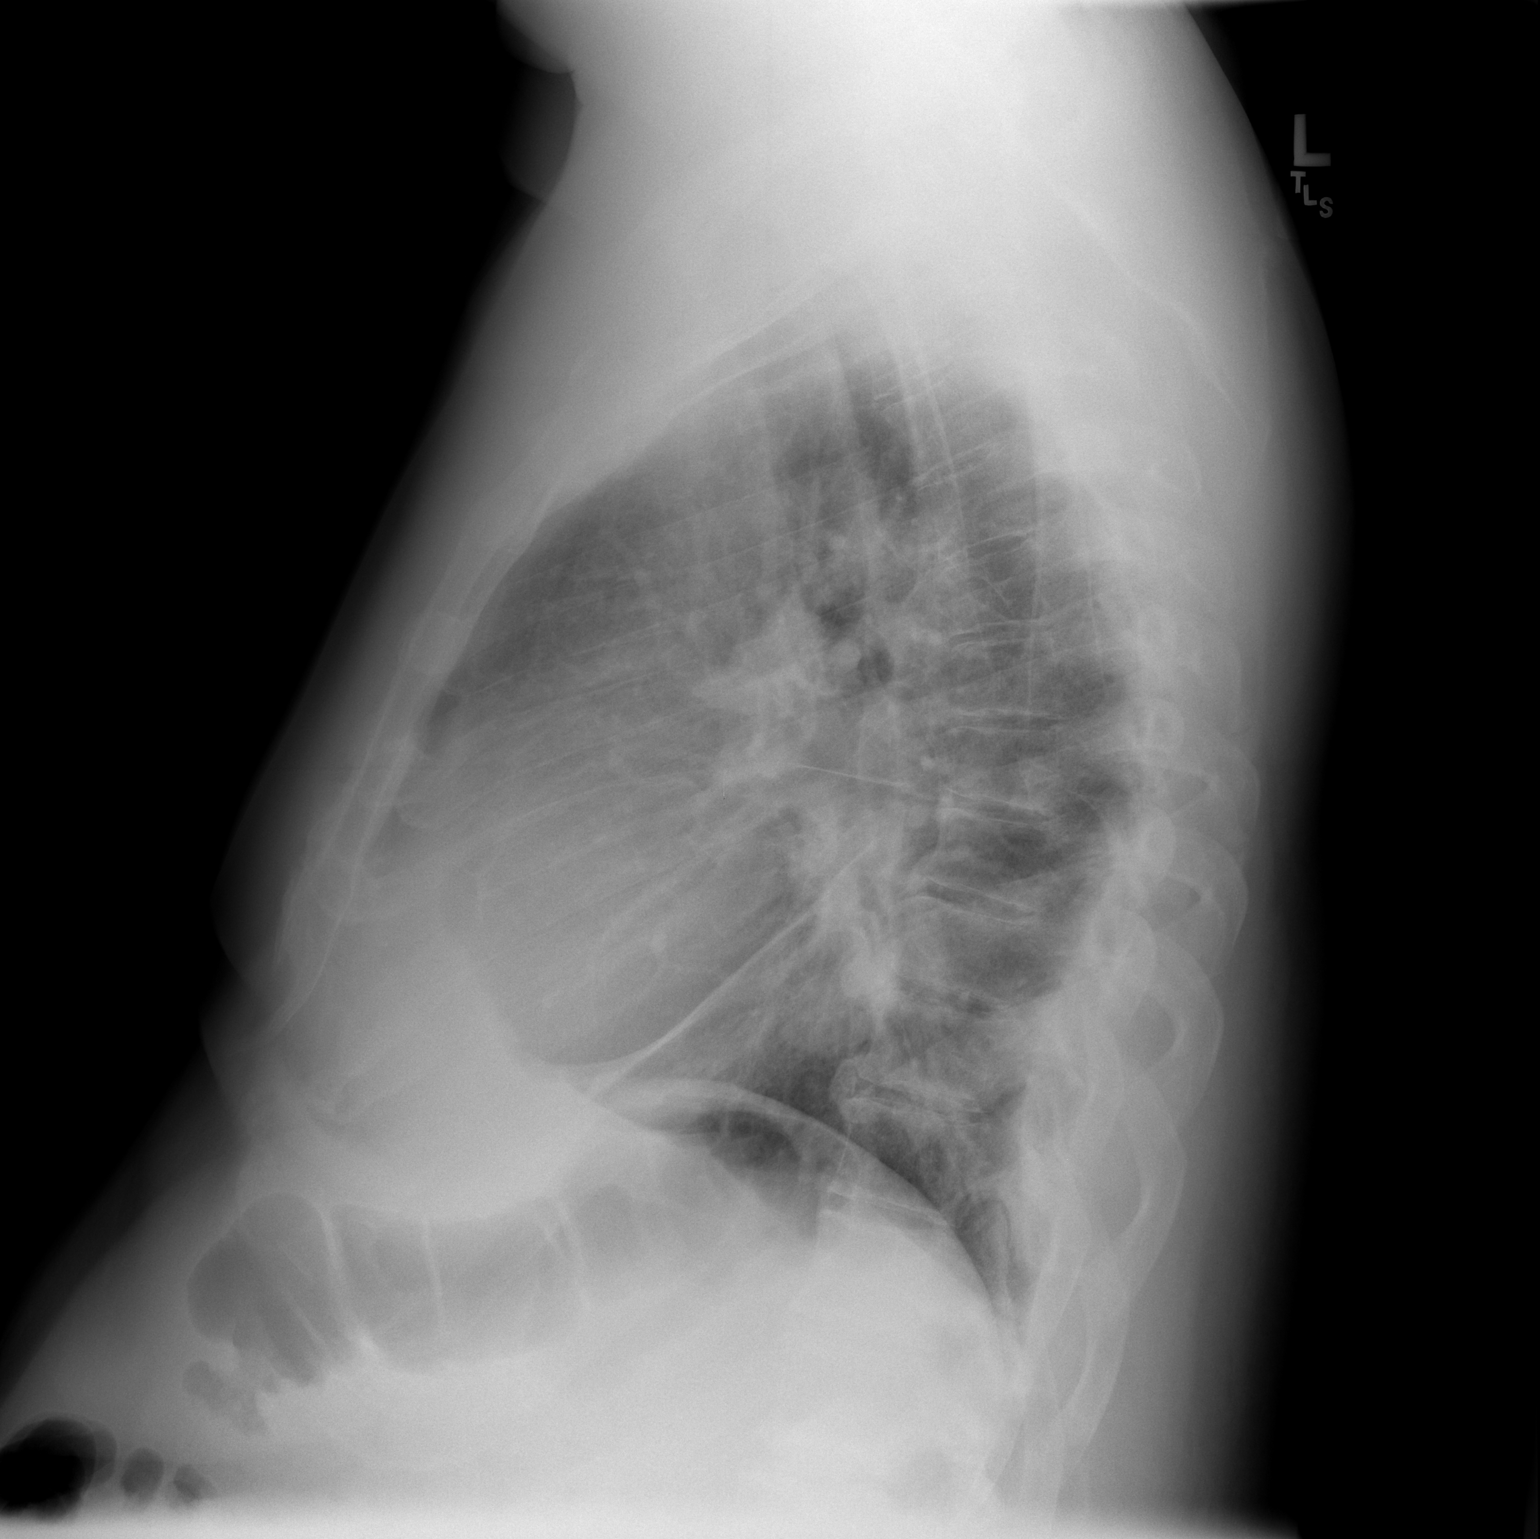

[2 of 2 positions shown; findings below may reference images not displayed]

FINDINGS: There is chronic pleural thickening associated with the
right hemithorax - unchanged.  There are no acute infiltrates.  The
left lung is clear.  There is mild cardiomegaly present.
IMPRESSION: 1.  Chronic pleural thickening associated with the right hemithorax
- unchanged.
2.  Stable cardiomegaly.
3.  No acute findings.

## 2009-02-12 ENCOUNTER — Ambulatory Visit: Payer: Self-pay | Admitting: Internal Medicine

## 2009-03-05 ENCOUNTER — Ambulatory Visit: Payer: Self-pay | Admitting: Cardiology

## 2009-04-06 ENCOUNTER — Ambulatory Visit: Payer: Self-pay | Admitting: Internal Medicine

## 2009-04-06 LAB — CONVERTED CEMR LAB: POC INR: 1.6

## 2009-04-29 ENCOUNTER — Encounter: Payer: Self-pay | Admitting: *Deleted

## 2009-05-05 ENCOUNTER — Ambulatory Visit: Payer: Self-pay | Admitting: Internal Medicine

## 2009-05-05 LAB — CONVERTED CEMR LAB: POC INR: 2.4

## 2009-06-10 ENCOUNTER — Ambulatory Visit: Payer: Self-pay | Admitting: Cardiology

## 2009-06-10 LAB — CONVERTED CEMR LAB: POC INR: 1.3

## 2009-08-21 ENCOUNTER — Ambulatory Visit: Payer: Self-pay | Admitting: Cardiology

## 2009-08-21 LAB — CONVERTED CEMR LAB: POC INR: 1.3

## 2009-11-23 ENCOUNTER — Encounter (INDEPENDENT_AMBULATORY_CARE_PROVIDER_SITE_OTHER): Payer: Self-pay | Admitting: Pharmacist

## 2010-01-22 ENCOUNTER — Encounter (INDEPENDENT_AMBULATORY_CARE_PROVIDER_SITE_OTHER): Payer: Self-pay | Admitting: Pharmacist

## 2010-02-10 ENCOUNTER — Encounter: Payer: Self-pay | Admitting: Internal Medicine

## 2010-07-26 ENCOUNTER — Encounter: Payer: Self-pay | Admitting: Surgery

## 2010-08-03 NOTE — Medication Information (Signed)
Summary: Coumadin Clinic  Anticoagulant Therapy  Managed by: Inactive Referring MD: Caryl Comes MD, [place] Callas MD: Caryl Comes MD, Remo Lipps Indication 1: Atrial Fibrillation Lab Used: [facility] Site: [place] INR RANGE 2.0-3.0          Comments: Attempted to reach pt numerous time over past months. Spoke with his daughter today and she states that pt is incarcerated and not sure of length of time at least a year.   Allergies: No Known Drug Allergies  Anticoagulation Management History:      His anticoagulation is being managed by telephone today.  Negative risk factors for bleeding include an age less than 7 years old.  The bleeding index is 'low risk'.  Positive CHADS2 values include History of CHF and History of HTN.  Negative CHADS2 values include Age > 62 years old.  Anticoagulation responsible provider: Caryl Comes MD, Remo Lipps.    Anticoagulation Management Assessment/Plan:      The patient's current anticoagulation dose is Warfarin sodium 5 mg tabs: Use as directed by Anticoagulation Clinic.  The next INR is due 08/28/2009.  Anticoagulation instructions were given to patient.  Results were reviewed/authorized by Inactive.         Prior Anticoagulation Instructions: INR 1.3  Start NEW dosing schedule of 1.5 tablets on Sunday, Tuesday, and Thursday and take 2 tablets all other days.  Return to clinic in 1 week.

## 2010-08-03 NOTE — Letter (Signed)
Summary: [facility] - Delinquent Coumadin 2  Coumadin  [address] [place]   [place], [place] [address]   Phone: [phone]  Fax: [phone]     January 22, 2010 MRN: [id number]   RONAN COCHELL [address] [place], [place]  [address]   Dear Mr. Denn,  We have attempted to contact you by phone and letter on multiple occasions to contact our office for important blood work associated with the blood thinner, warfarin (Coumadin).  Warfarin is a very important drug that can cause life threatening side effects including, bleeding, and thus requires close laboratory monitoring.  We are unable to accept responsibility for blood thinner-related health problems you may develop because you have not followed our recommendations for appropriate monitoring.  These may include abnormal bleeding occurrences and/or development of blood clots (stroke, heart attack, blood clots in legs or lungs, etc.).  We need for you to contact this office at the number listed above to schedule and complete this very important blood work.  Thank you for your assistance in this urgent matter.  Sincerely,  [place] [place] [facility]

## 2010-08-03 NOTE — Medication Information (Signed)
Summary: rov/ewj  Anticoagulant Therapy  Managed by: Margaretha Sheffield, PharmD Referring MD: Caryl Comes MD, [place] Callas MD: Aundra Dubin MD, [place] Warman Indication 1: Atrial Fibrillation Lab Used: [facility] [place] Site: [place] INR POC 1.3 INR RANGE 2.0-3.0  Dietary changes: no    Health status changes: no    Bleeding/hemorrhagic complications: no    Recent/future hospitalizations: no    Any changes in medication regimen? no    Recent/future dental: no  Any missed doses?: yes     Details: pt has missed one week of coumadin due to no refills, he has been back on coumadin 1 week now  Is patient compliant with meds? yes       Allergies: No Known Drug Allergies  Anticoagulation Management History:      Negative risk factors for bleeding include an age less than 67 years old.  The bleeding index is 'low risk'.  Positive CHADS2 values include History of CHF and History of HTN.  Negative CHADS2 values include Age > 32 years old.  Anticoagulation responsible provider: Aundra Dubin MD, Justis Closser.  INR POC: 1.3.  Exp: 08/2010.    Anticoagulation Management Assessment/Plan:      The patient's current anticoagulation dose is Warfarin sodium 5 mg tabs: Use as directed by Anticoagulation Clinic.  The next INR is due 08/28/2009.  Anticoagulation instructions were given to patient.  Results were reviewed/authorized by Margaretha Sheffield, PharmD.  He was notified by Margaretha Sheffield.         Prior Anticoagulation Instructions: INR 1.3 Today take extra 1 pill then resume 1.5 pills everyday except 2 pills on Mondays and Fridays. Recheck in one week.   Current Anticoagulation Instructions: INR 1.3  Start NEW dosing schedule of 1.5 tablets on Sunday, Tuesday, and Thursday and take 2 tablets all other days.  Return to clinic in 1 week.

## 2010-08-03 NOTE — Letter (Signed)
Summary: [facility]inquent Coumadin [facility]  [facility]oumadin  [address] [place]   [place], [place] [address]   Phone: [phone]  Fax: [phone]     Nov 23, 2009 MRN: [id number]   Steven Barnett [address], [place]  [address]   Dear [name],  This letter is being sent to you as a reminder that it is necessary for you to get your INR/PT checked regularly so that we can optimize your care.  Our records indicate that you were scheduled to have a test done recently.  As of today, we have not received the results of this test.  It is very important that you have your INR checked.  Please call our office at the number listed above to schedule an appointment at your earliest convenience.    If you have recently had your protime checked or have discontinued this medication, please contact our office at the above phone number to clarify this issue.  Thank you for this prompt attention to this important health care matter.  Sincerely,   [place]

## 2010-10-09 LAB — CBC
HCT: 39.8 % (ref 39.0–52.0)
Hemoglobin: 13.1 g/dL (ref 13.0–17.0)
MCHC: 33.1 g/dL (ref 30.0–36.0)
MCV: 76.9 fL — ABNORMAL LOW (ref 78.0–100.0)
Platelets: 189 10*3/uL (ref 150–400)
RDW: 19.3 % — ABNORMAL HIGH (ref 11.5–15.5)

## 2010-10-09 LAB — BASIC METABOLIC PANEL
Chloride: 99 mEq/L (ref 96–112)
GFR calc non Af Amer: >60 mL/min (ref >60)
Glucose, Bld: 95 mg/dL (ref 70–99)
Potassium: 4.6 mEq/L (ref 3.5–5.1)
Sodium: 134 mEq/L — ABNORMAL LOW (ref 135–145)

## 2010-10-09 LAB — POCT I-STAT, CHEM 8
Calcium, Ion: 0.98 mmol/L — ABNORMAL LOW (ref 1.12–1.32)
Chloride: 101 mEq/L (ref 96–112)
Glucose, Bld: 83 mg/dL (ref 70–99)
HCT: 44 % (ref 39.0–52.0)
Hemoglobin: 15 g/dL (ref 13.0–17.0)
TCO2: 26 mmol/L (ref 0–100)

## 2010-10-09 LAB — POCT CARDIAC MARKERS
CKMB, poc: <1 ng/mL — ABNORMAL LOW (ref 1.0–8.0)
CKMB, poc: <1 ng/mL — ABNORMAL LOW (ref 1.0–8.0)
Troponin i, poc: <0.05 ng/mL (ref 0.00–0.09)

## 2010-10-10 LAB — COMPREHENSIVE METABOLIC PANEL
ALT: 17 U/L (ref 0–53)
AST: 17 U/L (ref 0–37)
Calcium: 8.8 mg/dL (ref 8.4–10.5)
GFR calc Af Amer: >60 mL/min (ref >60)
Sodium: 133 mEq/L — ABNORMAL LOW (ref 135–145)
Total Protein: 7.3 g/dL (ref 6.0–8.3)

## 2010-10-10 LAB — CARDIAC PANEL(CRET KIN+CKTOT+MB+TROPI)
CK, MB: 0.8 ng/mL (ref 0.3–4.0)
CK, MB: 1.1 ng/mL (ref 0.3–4.0)
Relative Index: INVALID (ref 0.0–2.5)
Relative Index: INVALID (ref 0.0–2.5)
Total CK: 104 U/L (ref 7–232)

## 2010-10-10 LAB — DIFFERENTIAL
Eosinophils Absolute: 0.1 10*3/uL (ref 0.0–0.7)
Eosinophils Relative: 1 % (ref 0–5)
Lymphs Abs: 0.9 10*3/uL (ref 0.7–4.0)
Monocytes Absolute: 0.6 10*3/uL (ref 0.1–1.0)
Monocytes Relative: 8 % (ref 3–12)

## 2010-10-10 LAB — CBC
HCT: 37.3 % — ABNORMAL LOW (ref 39.0–52.0)
MCHC: 31.9 g/dL (ref 30.0–36.0)
Platelets: 245 10*3/uL (ref 150–400)
RBC: 5.02 MIL/uL (ref 4.22–5.81)
RDW: 17.4 % — ABNORMAL HIGH (ref 11.5–15.5)
RDW: 18.1 % — ABNORMAL HIGH (ref 11.5–15.5)

## 2010-10-10 LAB — BASIC METABOLIC PANEL
BUN: 12 mg/dL (ref 6–23)
Calcium: 8.8 mg/dL (ref 8.4–10.5)
GFR calc non Af Amer: >60 mL/min (ref >60)
Glucose, Bld: 88 mg/dL (ref 70–99)
Potassium: 3.9 mEq/L (ref 3.5–5.1)

## 2010-10-10 LAB — PROTIME-INR
INR: 1.6 — ABNORMAL HIGH (ref 0.00–1.49)
Prothrombin Time: 19.9 seconds — ABNORMAL HIGH (ref 11.6–15.2)

## 2010-10-10 LAB — POCT CARDIAC MARKERS
CKMB, poc: <1 ng/mL — ABNORMAL LOW (ref 1.0–8.0)
Troponin i, poc: <0.05 ng/mL (ref 0.00–0.09)

## 2010-10-10 LAB — BRAIN NATRIURETIC PEPTIDE: Pro B Natriuretic peptide (BNP): 196 pg/mL — ABNORMAL HIGH (ref 0.0–100.0)

## 2010-10-12 LAB — CARDIAC PANEL(CRET KIN+CKTOT+MB+TROPI)
CK, MB: 1.6 ng/mL (ref 0.3–4.0)
Relative Index: 1.5 (ref 0.0–2.5)
Relative Index: 1.5 (ref 0.0–2.5)
Total CK: 101 U/L (ref 7–232)
Total CK: 137 U/L (ref 7–232)
Troponin I: 0.01 ng/mL (ref 0.00–0.06)

## 2010-10-12 LAB — CBC
HCT: 31.2 % — ABNORMAL LOW (ref 39.0–52.0)
HCT: 34.2 % — ABNORMAL LOW (ref 39.0–52.0)
HCT: 36.5 % — ABNORMAL LOW (ref 39.0–52.0)
HCT: 36.8 % — ABNORMAL LOW (ref 39.0–52.0)
Hemoglobin: 10.2 g/dL — ABNORMAL LOW (ref 13.0–17.0)
Hemoglobin: 11.9 g/dL — ABNORMAL LOW (ref 13.0–17.0)
MCHC: 31.8 g/dL (ref 30.0–36.0)
MCHC: 32.4 g/dL (ref 30.0–36.0)
MCHC: 32.6 g/dL (ref 30.0–36.0)
MCHC: 32.7 g/dL (ref 30.0–36.0)
MCV: 78.7 fL (ref 78.0–100.0)
MCV: 79.3 fL (ref 78.0–100.0)
Platelets: 234 10*3/uL (ref 150–400)
Platelets: 246 10*3/uL (ref 150–400)
Platelets: 247 10*3/uL (ref 150–400)
Platelets: 251 10*3/uL (ref 150–400)
RBC: 3.98 MIL/uL — ABNORMAL LOW (ref 4.22–5.81)
RDW: 14.2 % (ref 11.5–15.5)
RDW: 14.4 % (ref 11.5–15.5)
RDW: 14.4 % (ref 11.5–15.5)
RDW: 14.6 % (ref 11.5–15.5)
RDW: 14.7 % (ref 11.5–15.5)
WBC: 10.1 10*3/uL (ref 4.0–10.5)
WBC: 7.6 10*3/uL (ref 4.0–10.5)

## 2010-10-12 LAB — COMPREHENSIVE METABOLIC PANEL
ALT: 13 U/L (ref 0–53)
ALT: 19 U/L (ref 0–53)
AST: 19 U/L (ref 0–37)
Albumin: 2.1 g/dL — ABNORMAL LOW (ref 3.5–5.2)
Albumin: 3.1 g/dL — ABNORMAL LOW (ref 3.5–5.2)
Alkaline Phosphatase: 49 U/L (ref 39–117)
Alkaline Phosphatase: 72 U/L (ref 39–117)
Calcium: 7.9 mg/dL — ABNORMAL LOW (ref 8.4–10.5)
Calcium: 8.9 mg/dL (ref 8.4–10.5)
Chloride: 103 mEq/L (ref 96–112)
Creatinine, Ser: 0.97 mg/dL (ref 0.4–1.5)
GFR calc Af Amer: 55 mL/min — ABNORMAL LOW (ref >60)
GFR calc Af Amer: >60 mL/min (ref >60)
Glucose, Bld: 91 mg/dL (ref 70–99)
Potassium: 4.2 mEq/L (ref 3.5–5.1)
Potassium: 4.6 mEq/L (ref 3.5–5.1)
Sodium: 133 mEq/L — ABNORMAL LOW (ref 135–145)
Sodium: 137 mEq/L (ref 135–145)
Total Bilirubin: 0.8 mg/dL (ref 0.3–1.2)
Total Protein: 5.7 g/dL — ABNORMAL LOW (ref 6.0–8.3)
Total Protein: 6.8 g/dL (ref 6.0–8.3)
Total Protein: 7.1 g/dL (ref 6.0–8.3)

## 2010-10-12 LAB — POCT I-STAT 3, ART BLOOD GAS (G3+)
Acid-Base Excess: 1 mmol/L (ref 0.0–2.0)
Bicarbonate: 29.6 mEq/L — ABNORMAL HIGH (ref 20.0–24.0)
pCO2 arterial: 63.7 mmHg (ref 35.0–45.0)
pH, Arterial: 7.276 — ABNORMAL LOW (ref 7.350–7.450)
pO2, Arterial: 74 mmHg — ABNORMAL LOW (ref 80.0–100.0)

## 2010-10-12 LAB — PROTEIN ELECTROPH W RFLX QUANT IMMUNOGLOBULINS
Albumin ELP: 45.9 % — ABNORMAL LOW (ref 55.8–66.1)
Beta 2: 6.4 % (ref 3.2–6.5)
Beta Globulin: 5.8 % (ref 4.7–7.2)
Gamma Globulin: 23.3 % — ABNORMAL HIGH (ref 11.1–18.8)
M-Spike, %: NOT DETECTED g/dL

## 2010-10-12 LAB — CULTURE, BLOOD (ROUTINE X 2): Culture: NO GROWTH

## 2010-10-12 LAB — BASIC METABOLIC PANEL
BUN: 10 mg/dL (ref 6–23)
BUN: 11 mg/dL (ref 6–23)
BUN: 11 mg/dL (ref 6–23)
BUN: 15 mg/dL (ref 6–23)
CO2: 30 mEq/L (ref 19–32)
Calcium: 8 mg/dL — ABNORMAL LOW (ref 8.4–10.5)
Calcium: 8.4 mg/dL (ref 8.4–10.5)
Chloride: 101 mEq/L (ref 96–112)
Chloride: 104 mEq/L (ref 96–112)
Creatinine, Ser: 1.19 mg/dL (ref 0.4–1.5)
GFR calc non Af Amer: 57 mL/min — ABNORMAL LOW (ref >60)
GFR calc non Af Amer: >60 mL/min (ref >60)
Glucose, Bld: 87 mg/dL (ref 70–99)
Glucose, Bld: 98 mg/dL (ref 70–99)
Glucose, Bld: 98 mg/dL (ref 70–99)
Potassium: 3.7 mEq/L (ref 3.5–5.1)
Potassium: 3.8 mEq/L (ref 3.5–5.1)
Potassium: 4.4 mEq/L (ref 3.5–5.1)
Potassium: 4.9 mEq/L (ref 3.5–5.1)
Sodium: 138 mEq/L (ref 135–145)
Sodium: 139 mEq/L (ref 135–145)

## 2010-10-12 LAB — IRON AND TIBC
Iron: 27 ug/dL — ABNORMAL LOW (ref 42–135)
Saturation Ratios: 12 % — ABNORMAL LOW (ref 20–55)
TIBC: 226 ug/dL (ref 215–435)

## 2010-10-12 LAB — PROTEIN / CREATININE RATIO, URINE
Creatinine, Urine: 15.1 mg/dL
Total Protein, Urine: <6 mg/dL

## 2010-10-12 LAB — UIFE/LIGHT CHAINS/TP QN, 24-HR UR
Albumin, U: DETECTED
Alpha 1, Urine: NOT DETECTED
Beta, Urine: NOT DETECTED
Gamma Globulin, Urine: NOT DETECTED
Total Protein, Urine-Ur/day: 97 mg/d (ref 10–140)

## 2010-10-12 LAB — LIPID PANEL
Cholesterol: 93 mg/dL (ref 0–200)
HDL: 42 mg/dL (ref >39)
LDL Cholesterol: 45 mg/dL (ref 0–99)
Total CHOL/HDL Ratio: 2.2 RATIO
Triglycerides: 31 mg/dL (ref <150)

## 2010-10-12 LAB — BODY FLUID CULTURE: Gram Stain: NONE SEEN

## 2010-10-12 LAB — ANAEROBIC CULTURE

## 2010-10-12 LAB — VITAMIN B12: Vitamin B-12: 341 pg/mL (ref 211–911)

## 2010-10-12 LAB — DIFFERENTIAL
Eosinophils Relative: 1 % (ref 0–5)
Lymphocytes Relative: 10 % — ABNORMAL LOW (ref 12–46)
Lymphs Abs: 0.8 10*3/uL (ref 0.7–4.0)
Monocytes Absolute: 0.7 10*3/uL (ref 0.1–1.0)

## 2010-10-12 LAB — POCT I-STAT 4, (NA,K, GLUC, HGB,HCT)
HCT: 32 % — ABNORMAL LOW (ref 39.0–52.0)
Hemoglobin: 10.9 g/dL — ABNORMAL LOW (ref 13.0–17.0)
Potassium: 3.9 mEq/L (ref 3.5–5.1)
Sodium: 139 mEq/L (ref 135–145)

## 2010-10-12 LAB — ABO/RH: ABO/RH(D): O POS

## 2010-11-16 NOTE — Discharge Summary (Signed)
NAMETELVIS, GORSUCH                ACCOUNT NO.:  [id number]   MEDICAL RECORD NO.:  [id number]          PATIENT TYPE:  INP   LOCATION:  1413                         FACILITY:  [facility]   PHYSICIAN:  Gilford Raid, M.D.     DATE OF BIRTH:  September 19, 1950   DATE OF ADMISSION:  11/19/2008  DATE OF DISCHARGE:  11/22/2008                               DISCHARGE SUMMARY   FINAL DIAGNOSIS:  Large right pleural empyema.   IN-HOSPITAL DIAGNOSES:  1. Atrial fibrillation.  2. Postoperative acute renal insufficiency.   SECONDARY DIAGNOSES:  1. Hypertension.  2. History of ventral hernia.   IN-HOSPITAL OPERATIONS AND PROCEDURES:  Right thoracotomy, drainage of  empyema, decortication of the entire right lung.   HISTORY AND PHYSICAL AND HOSPITAL COURSE:  The patient is a 60 year old  African American male who initially presented with fever, cough, and  shortness of breath in early May 2010.  He had a chest x-ray showing a  large right pleural effusion.  Subsequently, he had a CT scan done on  Nov 10, 2008 showing the loculated right pleural fluid collections  suggesting possible empyema with compression atelectasis of the right  lung.  The patient was treated with couple of courses of antibiotics  without improvement in his symptoms.  He developed progressive shortness  of breath and was admitted on Nov 19, 2008 to [facility].  He  was noted to be in atrial fibrillation at that time of admission and was  seen by Cardiology.  Cardiology managed his atrial fibrillation.  Repeat  chest x-ray performed continued to show large loculated right pleural  collection.  He had an attempted thoracentesis by Interventional  Radiology with 3 mL of bloody fluid removed.  Gram-stain and culture  were negative.  Dr. Cyndia Bent was consulted.  Dr. Cyndia Bent saw and evaluated  the patient.  He reviewed his CT scan which showed a complex  multiloculated right pleural empyema.  Dr. Cyndia Bent discussed with the  patient's family undergoing right VATS and decortication.  He discussed  risks and benefits with the patient.  The patient acknowledged his  understanding and agreed to proceed.  The patient was transferred over  to [facility] and placed under Dr. Vivi Martens service.  For  further details of the patient's past medical history and physical exam,  please see dictated H and P.   The patient was transferred over to [facility] on Nov 22, 2008.  He was taken to the operating room on Nov 22, 2008 where he underwent  right thoracotomy, drainage of empyema, and decortication of entire  right lung.  The patient tolerated this procedure and was transferred to  the Intensive Care Unit in stable condition.  Postoperatively, the  patient was noted to be hemodynamically stable.  She was extubated in  the evening of surgery.  Post-extubation, the patient was noted to be  alert and oriented x4.  The patient was placed on IV antibiotics  vancomycin and Zosyn.  Daily chest x-rays were obtained.  These were  slowly improving.  The patient had no air leak noted  from chest tubes.  One chest tube was discontinued on postop day #2 with the remaining  chest tubes discontinued today, postop day #4.  We will check a followup  PMI chest x-ray in the a.m. of Nov 27, 2008.  During this time, the  patient was using his incentive spirometer.  He was able to be weaned  off oxygen with O2 sats greater than 90%.  Postoperatively, Cardiology  continued to follow the patient.  He remained in rate-controlled atrial  fibrillation.  He was on beta-blocker.  The patient had been started on  an ACE inhibitors but developed acute renal insufficiency  postoperatively.  This was held.  Creatinine was followed.  It started  to trend down.  His lisinopril was restarted today, Nov 26, 2008.  We  will recheck the creatinine in a.m.  He was also started on Cardizem and  increased beta-blocker dose prior to discharge.   Cardiology will plan to  follow him as an outpatient.  He was not started on Coumadin secondary  to risk of bleeding from severely inflamed pleural space.  If the  patient remains in atrial fibrillation probably we can start Coumadin in  next several weeks which will be per Cardiology.  During the patient's  postoperative course, his blood pressure was followed up closely.  He  was noted to be hypertensive.  Medications were adjusted and will  continue to monitor.  He was continued on IV antibiotics until today Nov 26, 2008.  Today is his last dose.  Vancomycin and Zosyn will be  discontinued after today's final dose.  The patient is currently  afebrile.  He is up and ambulating well with assistance.  He is  tolerating the diet well.  No nausea or vomiting noted.  All incisions  were noted to be clean, dry, and intact and healing well.   On Nov 26, 2008, the patient was noted to be afebrile.  He is  hypertensive.  His medications were adjusted and we will recheck his  blood pressure and follow.  He is in atrial fibrillation rate controlled  in the 90s.  He is saturating 96% on room air.  Most recent lab work  shows a sodium of 138, potassium 3.8, chloride of 104, bicarbonate 30,  BUN of 11, creatinine 1.29, and glucose of 98.  White blood cell count  7.2, hemoglobin 10.2, hematocrit 31.2, and platelet count 220.  All  cultures obtained were negative.  The patient is tentatively ready for  discharge home in the a.m., Nov 27, 2008, postop day #5, pending his  chest x-ray remains stable.   FOLLOWUP APPOINTMENTS:  A followup appointment has been arranged with  Dr. Vivi Martens PA for December 15, 2008 at 1:30 p.m.  The patient needs to  obtain PMI chest x-ray 30 minutes prior to this appointment.  The  patient has a suture removal appointment made with the nurse for December 03, 2008 at 10:00 a.m.  He will need to follow up with Dr. Lovena Le as  directed.   ACTIVITY:  The patient instructed no driving  until released to do so and  no lifting over 10 pounds.  He is told to ambulate 3-4 times per day,  progress as tolerated, and continue his breathing exercises.   INCISIONAL CARE:  The patient is told to shower washing his incisions  using soap and water.  He is to contact the office if he develops any  drainage or opening from any of his  incision sites.   DIET:  The patient's diet will be low-fat, low-salt.   DISCHARGE MEDICATIONS:  1. Clonidine 0.1 mg b.i.d.  2. Loratadine 10 mg daily.  3. Proventil inhaler 2 puffs 4 times per day p.r.n.  4. Lisinopril 5 mg daily.  5. Metoprolol 75 mg b.i.d.  6. Cardizem 240 mg daily.  7. Aspirin 81 mg daily.  8. Guaifenesin 1200 mg b.i.d.  9. Percocet 5/325 one to two tablets q.4-6 hours p.r.n. pain.      Iverson Alamin, PA      Gilford Raid, M.D.  Electronically Signed    KMD/MEDQ  D:  11/26/2008  T:  11/27/2008  Job:  [id number]   cc:   Champ Mungo. Lovena Le, MD  Jana Hakim, M.D.

## 2010-11-16 NOTE — Consult Note (Signed)
NAMEGRISELDA, Barnett                ACCOUNT NO.:  [id number]   MEDICAL RECORD NO.:  [id number]          PATIENT TYPE:  INP   LOCATION:  [place]                         [facility]ACILITY[facility]  [facility]   PHYSICIAN:  Marijo Conception. Wall, MD, FACCDATE OF BIRTH:  23-Jun-1951   DATE OF CONSULTATION:  DATE OF DISCHARGE:                                 CONSULTATION   We were asked by the [facility], specifically Dr.  Jana Hakim, to consult on [name] with atrial  fibrillation.   HISTORY OF PRESENT ILLNESS:  Steven Barnett is a 60 year old black male who  has had about a 6-week history of increasing dyspnea on exertion.  Over  the last few weeks, he has felt like he could not take a deep breath.  He denies any tachy palpitations, presyncope, or syncope.  He has also  had a cough and has had some low grade fever and chills.  As an  outpatient, he received a couple of courses of antibiotics with  azithromycin and doxycycline.   Chest x-ray showed a pleural effusion on the right side.  CT scan was  done on Nov 10, 2008, which showed a pleural effusion on the right that  appeared loculated perhaps related to infection or potentially  malignancy.   He was admitted today for further evaluation and treatment.  On  admission, he was noted to have a heart rate about 100 beats per minute.  An EKG shows atrial fibrillation with a slightly increased ventricular  rate.  He is stable and has no symptoms with his atrial fib.   His past medical history is significant for hypertension for a long  time.  He has a large ventral hernia.  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  He used to drink, but he has never smoked cigarettes.   His medications on admission were:  1. Clonidine 0.1 mg p.o. b.i.d.  2. Loratadine 10 mg per day.  3. Proventil 2 puffs q.i.d.   PAST SURGICAL HISTORY:  None.   ALLERGIES:  None.   SOCIAL  HISTORY:  Married and has 3 children.   FAMILY HISTORY:  Significant for hypertension, diabetes in his mother.  There is a history of colorectal cancer in his sister.   REVIEW OF SYSTEMS:  He denies any nausea, vomiting, difficulty  swallowing, hematemesis, hemoptysis, or abdominal pain.  No vomiting or  diarrhea.  He has had significant weight change, having weight as much  as 315 several years ago and now has gotten down to about 250, now today  weighs about 285.   Rest of the systems is negative.  He denies any skin rashes per se.   PHYSICAL EXAMINATION:  GENERAL:  He is a very pleasant gentleman in no  acute distress.  He is overweight.  Skin is slightly moist.  VITAL SIGNS:  His blood pressure is 163/110, his heart rate 90-100 and  irregular.  Telemetry demonstrates atrial fib with no significant  pauses.  His temperature was 97.9 and  sats 95% on 2 L.  HEENT:  Sclerae are nonicteric.  Facial symmetry is normal.  Dentition  is satisfactory.  NECK:  Thick.  Difficult to assess JVD.  Carotid upstrokes were equal  bilaterally without bruits.  Thyroid is not enlarged.  Trachea is  midline.  No obvious lymphadenopathy.  LUNGS:  Decreased breath sounds in the right base.  HEART:  Nondisplaced PMI.  Normal S1 and S2, variable intensity with his  atrial fib.  There is no gallop.  ABDOMEN:  Protuberant, good bowel sounds.  There is no obvious  organomegaly, though difficult to assess.  EXTREMITIES:  No edema.  Pulses are intact.  NEUROLOGIC:  Intact.  SKIN:  Unremarkable.   His laboratory data demonstrates a potassium 3.7 and creatinine 0.97.  Protime is normal.  CBC shows a hemoglobin 11.8.  Other blood work is  pending.  Chest x-rays are above as reviewed.  EKG shows no significant  EKG changes.  ST-segment changes and atrial fib with a slightly  increased rate.   ASSESSMENT:  1. Atrial fibrillation, unknown duration, but probably related to his      acute pulmonary and pleural  process.  He does have a long history      of hypertension which has been poorly controlled which could also      be contributing to his atrial fibrillation.  Either way his [place] 2      score is fairly low.  I do not think anticoagulation is indicated      this time particularly in light of the fact he going to need some      invasive procedures to diagnose the etiology of his pleural      effusion.  2. Hypertension.  His blood pressure is poorly controlled.  With his      heart rate being of around 100, I would start p.o. diltiazem 60 mg      q.6 h.   Other appropriate blood work has been ordered including a TSH.   We will also obtain a 2-D echocardiogram.   Thank you for the consultation.  We will follow closely with you.      [name], MD, [facility]  Electronically Signed     TCW/MEDQ  D:  11/19/2008  T:  11/20/2008  Job:  [id number]

## 2010-11-16 NOTE — Consult Note (Signed)
NAMECHINEMEREM, ALDABA NO.:  [id number]   MEDICAL RECORD NO.:  [id number]           PATIENT TYPE:   LOCATION:                                 FACILITY:   PHYSICIAN:  Gilford Raid, M.D.     DATE OF BIRTH:  04/03/1951   DATE OF CONSULTATION:  11/21/2008  DATE OF DISCHARGE:                                 CONSULTATION   REFERRING PHYSICIANS:  Jana Hakim, MD   REASON FOR CONSULTATION:  Right empyema.   CLINICAL HISTORY:  I was asked by Dr. Arnoldo Morale and the Hospitalist  Service to evaluate Steven Barnett for treatment of a right pleural empyema.  He is a 60 year old gentleman who presented about 6 weeks ago with  complaints of shortness of breath, cough, as well as some fever and  chills.  He was treated as an outpatient with two courses of antibiotic  therapy.  He continued to have nonproductive cough and shortness of  breath.  He did have a chest x-ray and then a CT scan on Nov 10, 2008,  which showed a loculated right pleural effusion suggestive of empyema.  The patient was not admitted until Nov 19, 2008, when he presented with  worsening shortness of breath.  At the time of the admission, he was  noted to be in rapid atrial fibrillation and was seen by Cardiology and  started on antiarrhythmic treatment for that.  It was felt that this was  most likely due to his intrathoracic process.   PAST MEDICAL HISTORY:  Significant for hypertension.  He has a history  of ventral hernia.   ALLERGIES:  None.   MEDICATIONS:  Prior to admission were,  1. Clonidine 0.1 mg b.i.d.  2. Loratadine 10 mg daily.  3. Proventil inhaler 2 puffs t.i.d. p.r.n.   REVIEW OF SYSTEMS:  GENERAL:  He reports having some fever and chills,  but cannot be specific about the temperature.  He has had some fatigue.  He has had no appetite changes or weight loss.  EYES:  Negative.  ENT:  Negative.  ENDOCRINE:  He denies diabetes and hypothyroidism.  CARDIOVASCULAR:  He denies any chest pain.   He has had shortness of  breath with minimal exertion that has been progressing.  He denies PND,  but has had some orthopnea.  He has had some palpitations.  He has had  no peripheral edema.  RESPIRATORY:  He has had nonproductive cough.  Denies hemoptysis.  GI:  He denies nausea, vomiting.  Denies melena and  bright red blood per rectum.  GU:  Denies dysuria and hematuria.  MUSCULOSKELETAL:  He denies arthralgias and myalgias.  NEUROLOGICAL:  He  denies any focal weakness or numbness.  He denies dizziness and syncope.  He has never had a TIA or stroke.  PSYCHIATRIC:  Negative.  HEMATOLOGICAL:  Negative.   SOCIAL HISTORY:  He is divorced and has three children.  He is a  nonsmoker.  He has a history of polysubstance abuse, but not at the  current time.  He drinks about one alcoholic drink per week.  FAMILY HISTORY:  Mother and brother with hypertension.  His mother had  diabetes.  He had a sister with colorectal cancer.   PHYSICAL EXAMINATION:  VITAL SIGNS:  He is afebrile.  Blood pressure is  155/95 and his pulse is 100 and regular, respiratory rate is 20 and  unlabored.  GENERAL:  He is a large frame obese black male in no distress.  HEENT:  Normocephalic and atraumatic.  Pupils are equal and reactive to  light and accommodation.  Extraocular muscles are intact.  His throat is  clear.  NECK:  Normal carotid pulses bilaterally.  No bruits.  There is no  adenopathy or thyromegaly.  CARDIAC:  Regular rate and rhythm with normal S1 and S2.  There is no  murmur, rub, or gallop.  LUNGS:  Decreased breath sounds over the right chest, which are tubular.  ABDOMEN:  Active bowel sounds.  His abdomen is soft and nontender.  There are no palpable masses or organomegaly.  EXTREMITIES:  No peripheral edema.  Pedal pulses are palpable  bilaterally.  SKIN:  Warm and dry.  NEUROLOGIC:  Alert and oriented x3.  Motor and sensory exams are grossly  normal.   LABORATORY DATA:  White blood cell  count has been normal at 7.7.  His  BNP was 131.  Electrolytes are normal with a BUN of 18 and creatinine of  0.96.  Liver function profile is within normal limits.  Albumin was 3.0.  His hemoglobin was 12.4, platelet count 251,000.  His TSH was 0.992,  which is within normal range.  His free T4 was 0.79, which was slightly  low.   IMPRESSION:  Steven Barnett has a large loculated right pleural empyema seen  initially on CT scan, Nov 10, 2008.  He had an attempt at thoracentesis  by Interventional Radiology yesterday, where they could only withdraw 3  mL of bloody fluid.  I think this will require right thoracotomy for  complete drainage of this organized empyema and decortication of the  lung.  It has been several weeks since his initial CT scan showing this  empyema and I suspect that he will have a thick fibrinous peel that has  become organized over his lung with significant trapping of the long.  I  do not think this would be suitable for VATS drainage.  I think most  likely he developed some pneumonia and got a parapneumonic effusion,  which turned into an empyema.  I discussed the operative procedure of  right thoracotomy and drainage of the empyema and decortication of the  lung with the patient and his family.  We discussed the alternatives,  benefits, and risks including, but not limited to bleeding, blood  transfusion, infection, injury to the lung, prolonged air leak,  respiratory failure, trapping of the lung with incomplete re-expansion  and space problems, and he understands and agrees to proceed with  surgery.  We will plan to do this on Nov 22, 2008.      Gilford Raid, M.D.  Electronically Signed     BB/MEDQ  D:  11/22/2008  T:  11/23/2008  Job:  [id number]

## 2010-11-16 NOTE — H&P (Signed)
NAMEALIM, MCFARLAND                ACCOUNT NO.:  [id number]   MEDICAL RECORD NO.:  [id number]         PATIENT TYPE:  INP   LOCATION:                               FACILITY:  [facility]   PHYSICIAN:  Jana Hakim, M.D. DATE OF BIRTH:  04/18/1951   DATE OF ADMISSION:  11/19/2008  DATE OF DISCHARGE:                              HISTORY & PHYSICAL   DATE OF ADMISSION:  Nov 19, 2008.   CHIEF COMPLAINT:  Cough, shortness of breath.   HISTORY OF PRESENT ILLNESS:  This is a 60 year old male who is being  sent for direct admission to the hospital secondary to continued  shortness of breath, coughing for the past 6 weeks.  The patient reports  having mild fevers and chills.  As an outpatient, he has been seen twice  and placed on antibiotic therapy for 2 courses which included  azithromycin, followed by doxycycline therapy.  The patient continued to  have cough which has been nonproductive.  He denies any weight loss or  night sweats.  The patient was sent for a chest x-ray which returned  with abnormal findings and a CT scan of the chest was performed on Nov 10, 2008, results of which returned revealing a partially loculated  right pleural effusion suggestive of empyema.  Patient was notified and  the patient was referred for admission for further evaluation and  treatments.  The patient reports being a nonsmoker.   PAST MEDICAL HISTORY:  1. Hypertension.  2. Ventral hernia.   PAST SURGICAL HISTORY:  None.   MEDICATIONS:  1. Clonidine 0.1 mg 1 p.o. b.i.d.  2. Loratadine 10 mg 1 p.o. daily.  3. Proventil inhaler 2 puffs q.i.d. p.r.n.   ALLERGIES:  NO KNOWN DRUG ALLERGIES.   SOCIAL HISTORY:  The patient is married with 3 children.  He is a  nonsmoker.  He reports drinking 1 drink of alcohol weekly.  He has a  past history of polysubstance abuse.   FAMILY HISTORY:  Positive for hypertension in his mother and brother.  Diabetes in his mother.  The patient also has a sister with  colorectal  cancer.   REVIEW OF SYSTEMS:  Pertinents are mentioned above.  All other organ  systems are negative.  The patient denies having any nausea, vomiting,  diarrhea, myalgias, arthralgias, syncope, seizures, lightheadedness,  appetite loss, weight loss.   PHYSICAL EXAMINATION:  This is a 60 year old, overweight-to-obese male  in discomfort, but no acute distress.  VITAL SIGNS:  Temperature of 97.4, blood pressure 162/100, heart rate  104, respirations 20.  HEENT:  Normocephalic, atraumatic.  There is no scleral icterus.  Pupils  equally round, reactive to light.  Extraocular movements are intact.  Funduscopic benign.  Oropharynx is clear.  NECK:  Supple, full range of motion.  No thyromegaly, adenopathy,  jugular venous distention.  CARDIOVASCULAR: Regular rate and rhythm.  No  murmurs, gallops or rubs.  LUNGS:  Decreased breath sounds, right lung field but no rales, rhonchi  or wheezes are appreciated.  ABDOMEN: Positive bowel sounds, soft,  nontender, nondistended.  EXTREMITIES:  Without cyanosis, clubbing  or edema.  NEUROLOGIC:  The patient is alert and oriented.  There are no focal  deficits on examination.   ASSESSMENT:  A 60 year old male being admitted with:  1. Shortness of breath.  2. Right-sided pleural effusion, partially loculated, suggestive of an      empyema; however, malignancy cannot be excluded.  This has been      seen on CT scan.  3. Hypertension.   PLAN:  The patient will be admitted for further evaluation and  treatment.  Interventional radiology will be contacted for a possible  thoracentesis/drainage and diagnostic studies.  Admission laboratory  studies will be ordered as well.  DVT and GI prophylaxis will also be  ordered.  The patient's blood pressure medications will be further  adjusted.      Jana Hakim, M.D.  Electronically Signed     HJ/MEDQ  D:  11/19/2008  T:  11/19/2008  Job:  [id number]

## 2010-11-16 NOTE — H&P (Signed)
Steven Barnett, Steven Barnett                ACCOUNT NO.:  [id number]   MEDICAL RECORD NO.:  [id number]          PATIENT TYPE:  INP   LOCATION:  0111                         FACILITY:  [facility]   PHYSICIAN:  Denice Bors. Stanford Breed, MD, FACCDATE OF BIRTH:  1950-11-04   DATE OF ADMISSION:  01/07/2009  DATE OF DISCHARGE:                              HISTORY & PHYSICAL   PRIMARY CARDIOLOGIST:  Dr. Jenell Milliner   ELECTROPHYSIOLOGISTS:  Dr. Cristopher Peru and Dr. Virl Axe   CHIEF COMPLAINT:  Chest pain.   HISTORY OF PRESENT ILLNESS:  Mr. Moreau is a 60 year old morbidly obese  African American male with a history significant for hypertension, sleep  disorder consistent with obstructive sleep apnea, recent diagnosis of  diastolic heart failure with moderate LVH and recent diagnosis of  postoperative atrial fibrillation (continues to be in atrial  fibrillation) presenting with atypical chest pain.  The patient was  hospitalized in May 2010 and diagnosed with a right-sided empyema, for  which he underwent a thoracotomy.  He was found to be in atrial  fibrillation postoperatively and has been in this rhythm ever since.  At  that time, he was also diagnosed with diastolic heart failure and found  to have moderate left ventricular hypertrophy by echocardiogram.  Since  his discharge, he has been experiencing dyspnea on exertion, orthopnea,  and lower extremity edema but noticed some improvement over the last  week since his outpatient follow-up appointment with [facility]  (with Dr. Jolyn Nap).  However, this morning, he noted approximately 2  hours of left-sided chest tightness associated with shortness of breath.  The pain was nonradiating, and he denies any other associated symptoms  except nausea.  Patient reports just not feeling right, and,  therefore, presented to [facility] Emergency Department for further  evaluation.  Since arriving in the ED, he has been normotensive, still  in atrial  fibrillation with ventricular rate within normal limits.  EKG  is without any significant changes.  He has been 100% on 2 L by nasal  cannula, and symptoms have since resolved.   PAST MEDICAL HISTORY:  1. Atrial fibrillation (on Coumadin; planned cardioversion scheduled      in the next 2-3 weeks, currently rate controlled).  2. Hypertension.  3. Diastolic heart failure (moderate LVH).  4. Sleep disorder consistent with obstructive sleep apnea.  5. Morbid obesity.  6. History of ventral hernia.  7. History of polysubstance abuse (clean for 1-1/2 years).  8. History of postoperative renal insufficiency.  9. Empyema, right sided (as above, s/p thoracotomy).   SOCIAL HISTORY:  Patient is married with 3 children.  He is a nonsmoker,  denies any history of smoking, reports very rarely having up to 1 drink  and has been clean from a history of polysubstance abuse for 1-1/2  years.  Denies herbal medication use.  Regular diet.  No regular  exercise.   FAMILY HISTORY:  Significant for father with congestive heart failure  and s/p CABG in his 20s.   REVIEW OF SYSTEMS:  Please see HPI.  All other systems reviewed and were  negative.   CODE STATUS:  Full.   ALLERGIES:  NKDA.   MEDICATIONS:  1. Clonidine 0.1 mg p.o. b.i.d.  2. Metoprolol succinate 75 mg p.o. b.i.d.  3. Diltiazem ER 120 mg p.o. daily.  4. Enteric-coated aspirin 81 mg p.o. daily.  5. Proventil HFA 108 two puffs q.i.d.  6. Warfarin 5 mg p.o. daily.  7. Mucinex maximum strength 1200 mg p.o. daily.  8. Lasix 40 mg p.o. daily.   PHYSICAL EXAMINATION:  VITAL SIGNS:  BP 121/81, pulse 84, respiratory  rate 18, O2 saturation 100% on 2 L by nasal cannula.  GENERAL:  The patient was alert and oriented x3, in no apparent  distress, able to speak in full sentences without respiratory distress.  HEENT:  Head normocephalic, atraumatic.  Pupils are equal, round and  reactive to light.  Extraocular muscles are intact.  Nares  patent  without discharge.  Dentition is fair.  Oropharynx without erythema or  exudate.  NECK:  Supple without lymphadenopathy.  No thyromegaly, no JVD and no  bruit.  CARDIAC:  Heart rate was irregularly irregular with audible S1 and S2.  No clicks, rubs, murmurs or gallops.  Pulses were 2+ and equal in both  upper and lower extremities bilaterally.  LUNGS:  Clear to auscultation bilaterally with decreased breath sounds  in the right base.  SKIN:  With no rashes, lesions or petechiae.  ABDOMEN:  Soft, mildly tender in the right upper quadrant around rib  cage, nondistended, normal abdominal bowel sounds, no rebound or  guarding, no hepatosplenomegaly.  Patient is morbidly obese.  EXTREMITIES:  Showed no clubbing, cyanosis or edema.  MUSCULOSKELETAL:  Revealed likely musculoskeletal pain in the right  upper quadrant of abdomen/flank, otherwise right-sided chest mildly  tender and left chest (at site of symptoms this a.m.) also mildly  tender; otherwise, no joint deformity, no effusions, no spinal or CVA  tenderness.  NEUROLOGIC:  Cranial nerves II-XII are grossly intact.  Strength is 5/5  in all extremities and axial groups with normal sensation throughout and  normal cerebellar function.   RADIOLOGY:  Chest x-ray showed overall improvement in aeration of the  right lung since prior study.  There remains some right pleural effusion  and thickening as well as improvement in the right lung airspace  density.   EKG showed a rhythm of atrial fibrillation at a rate of 77 without  significant ST-T wave changes or significant Q waves, normal axis, no  evidence of hypertrophy.  QRS 92.  QTc was 445.  No significant changes  from prior tracing completed Nov 22, 2008.   LABORATORIES:  WBC 7.3, hemoglobin 12.5, hematocrit 39.1, platelet count  261, differential normal.  Pro time 18.7, INR 1.5.  Sodium 133,  potassium 3.5, chloride 99, CO2 of 28, BUN 12, creatinine 1.11, glucose  99.  Liver  function tests within normal limits.  Albumin 3.4, calcium  8.8.  Point-of-care markers were negative.  BNP was 196.   ASSESSMENT/PLAN:  Mr. Bellmore is a 60 year old morbidly obese African  American male with history significant (as described above) presenting  status post thoracotomy and deconditioning after right lung empyema.  Patient had above procedure in May 2010.  During admission, he was noted  to be in atrial fibrillation and has since been put on Coumadin and been  rate controlled.  As described above, Dr. Caryl Comes plans for cardioversion  in the upcoming weeks after INR has been therapeutic.  Since his  discharge, his dyspnea on exertion  has somewhat improved.  He denies  palpitations.  He had left-sided chest pain today x2 hours, and he came  to the emergency room, question pleuritic component, no radiation.  Positive for nausea.  EKG shows atrial fibrillation with no significant  ST-T wave changes.  Chest pain is atypical, question musculoskeletal.  Given recent hospitalization, will rule out pulmonary embolus with CT  angiography, cycle cardiac enzymes; if all is negative, outpatient  Myoview will be scheduled.  We will continue home medications including  Lopressor and Cardizem for rate control as well as Coumadin per  pharmacy.  At this point, no changes to the plan for atrial fibrillation  outlined by Dr. Caryl Comes in his last clinic note.  (Follow up with Coumadin  Clinic; direct current cardioversion (DCCV) once INR is therapeutic for  3 consecutive weeks).  Continue Lasix also __________ diastolic  congestive heart failure.      Guss Bunde, PAC      Denice Bors. Stanford Breed, MD, [facility]  Electronically Signed    MS/MEDQ  D:  01/07/2009  T:  01/07/2009  Job:  [id number]

## 2010-11-16 NOTE — Discharge Summary (Signed)
Steven Barnett, BLANCETT NO.:  [id number]   MEDICAL RECORD NO.:  [id number]          PATIENT TYPE:  INP   LOCATION:  2027                         FACILITY:  [facility]   PHYSICIAN:  Champ Mungo. Lovena Le, MD    DATE OF BIRTH:  [birth year]   DATE OF ADMISSION:  11/22/2008  DATE OF DISCHARGE:  11/27/2008                               DISCHARGE SUMMARY   The patient was hospitalized here at [facility] from Nov 21, 2008, to Nov 27, 2008.  The patient has possible dry cough with  lisinopril which has been stopped this admission.   FINAL DIAGNOSES:  1. Discharging postoperative day #4 status post drainage and      decortication of a right empyema.  2. Atrial fibrillation, new diagnosis this admission.  Coumadin has      been delayed until total healing of his pleural decortication.  3. Lisinopril held secondary to increased creatinine.  The zenith of      his creatinine was 1.57 on Nov 24, 2008.  It is now normalized, but      he also has dry cough.  4. Hypertension, difficult control.  The patient may be helped by the      cardioactive medications started to control his atrial fibrillation      which was a rapid rate.      a.     Metoprolol 75 mg twice daily.      b.     Cardizem 120 mg daily.  5. Echocardiogram with ejection fraction of 55-60% taken on Nov 20, 2008.   Past medical history is significant for hypertension and ventral hernia.   BRIEF HISTORY:  Steven Barnett is 61 year old male.  He has shortness of  breath, constant cough, some fever, and chills.  He did have antibiotic  therapy as an outpatient.  The chest x-ray showed loculated right  pleural effusion suggestive of empyema.  The patient admitted on Nov 19, 2008, with progressive dyspnea.  He was noted to be in rapid atrial  fibrillation and rate control therapy has been started for him with  daily monitoring of his rate.   At the time of discharge, the patient's rate is moderately well  remaining  in atrial fibrillation with heart rates less than 100.   Discharge medications are as follows:  1. Clonidine 0.1 mg twice daily.  This was added back on discharge.  2. Metoprolol 50 mg tablets 1 and one-half tablet in the morning, 1      and one-half tablet in the evening.  This is a new medication.  3. New medication diltiazem 120 mg daily.  4. Enteric-coated aspirin 81 mg daily.  5. Loratadine 10 mg daily.  6. Proventil inhaler 2 puffs 4 times daily.  7. Guaifenesin 600 mg tablets 2 tablets in the morning and 2 tablets      in the evening.  8. Percocet 5/325 one to two tablets every 4-6 hours as needed for      pain.  9. Coumadin 5 mg tablets.   Currently,  the thought is that he would start them Thursday, December 18, 2008; however, he will see Dr. Chong Sicilian al on December 15, 2008.  If they  would rather delay the start, then we will rethink the start date.  Otherwise, he will start on December 18, 2008, one tab daily.  He has  followup at [facility], [address], [place],  [facility], Monday, December 22, 2008, at 9:45.  Hopefully, he will  start taking his Coumadin on June 17. 2010, and to see Dr. Lovena Le,  Wednesday, June 30, at 9:30 in the morning.  The patient has been given  a map.   DISCHARGE LABORATORY DATA:  Sodium 130, potassium 3.7, chloride 101,  carbonate 31, BUN is 10, creatinine 1.19, glucose 87.  As mentioned  above, his creatinine topped out on Nov 24, 2008, at 1.57.  Last  complete blood count was Nov 25, 2008, white cells 7.2, hemoglobin 10.2,  hematocrit 31.2, platelets are 220.      Sueanne Margarita, PA      [place] Lovena Le, MD  Electronically Signed    GM/MEDQ  D:  11/27/2008  T:  11/27/2008  Job:  [id number]   cc:   Gilford Raid, M.D.  Jana Hakim, M.D.

## 2010-11-16 NOTE — Discharge Summary (Signed)
NAMEERMA, FRIERSON                ACCOUNT NO.:  [id number]   MEDICAL RECORD NO.:  [id number]          PATIENT TYPE:  INP   LOCATION:  1440                         FACILITY:  [facility]   PHYSICIAN:  Loretha Brasil. Lia Foyer, MD, FACCDATE OF BIRTH:  [birth year]   DATE OF ADMISSION:  01/07/2009  DATE OF DISCHARGE:  01/09/2009                               DISCHARGE SUMMARY   PROCEDURES:  CT angiogram of the chest.   PRIMARY FINAL DISCHARGE DIAGNOSIS:  Chest pain, cardiac enzymes negative  for myocardial infarction.   SECONDARY DIAGNOSES:  1. Preserved left ventricular function with an ejection fraction of      55% to 60% by echocardiogram in May of 2010.  2. Morbid obesity with a body mass index of 40.  3. History of diastolic congestive heart failure.  4. Atrial fibrillation.  5. History of right-sided empyema, status post thoracotomy and      decortication in May of 2010.  6. Hypertension.  7. Sleep disorder with possible obstructive sleep apnea.  8. History of ventral hernia.  9. Remote history of polysubstance abuse.  10.History of postoperative renal insufficiency.  11.Family history of coronary artery disease in his father.   TIME AT DISCHARGE:  33 minutes.   HOSPITAL COURSE:  Mr. Dai is a 60 year old male with no previous  history of coronary artery disease.  He was hospitalized from Nov 19, 2008, through Nov 27, 2008, first at [facility] for treatment of his  empyema including thoracotomy, drainage, and decortication then  transferred to [facility] for evaluation of atrial fibrillation.  He saw  Dr. Caryl Comes on December 31, 2008, and was complaining of weakness and dyspnea  on exertion.  The plan was for adding Lasix to his medication regimen  for volume control and cardioversion once he had been therapeutic with  Coumadin for 4 to 6 weeks.  He came to the hospital on December 08, 2008, for  chest pain that lasted approximately 2 hours.  He was admitted for  further evaluation and  treatment.   His EKG showed atrial fibrillation with a controlled ventricular rate.  A chest x-ray showed overall improved aeration.  His Coumadin level was  subtherapeutic so he was scheduled for a CT angiogram of the chest.  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  He had a  chronic right pleural effusion with loculation and surrounding pleural  thickening which was increased in degree.  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  Dr. Arlyce Dice was called in  consultation and stated that these changes were normal to see post  decortication and should improve with time.  Dr. Arlyce Dice felt that Mr.  Gibney could be followed in the office.   On January 09, 2009, Mr. Ketterling was seen by Dr. Lia Foyer.  Dr. Lia Foyer felt  that his respiratory status was improved and considered him stable for  discharge with close outpatient followup.   DISCHARGE INSTRUCTIONS:  His activity level is to be  increased  gradually.  He is encouraged to stick to a low-sodium, heart-healthy  diet.  He is to follow up with the Coumadin clinic on January 14, 2009, at  noon and with the P.A. for  Dr. Lovena Le on January 14, 2009, at 12:15.  He  is to follow up with Dr. Arlyce Dice as needed.   DISCHARGE MEDICATIONS:  1. Coumadin 7.5 mg daily except for 10 mg on Monday and Friday.  2. K-Dur 10 mEq daily.  3. Metoprolol ER 50 mg 1 tab b.i.d.  4. Clonidine 0.1 mg b.i.d.  5. Cardizem CD 120 mg daily.  6. Loratadine 10 mg daily.  7. Lasix 40 mg daily.  8. Aspirin 81 mg daily.  9. Mucinex DM daily.  10.Albuterol MDI q.i.d.      Rosaria Ferries, PA-C      Loretha Brasil. Lia Foyer, MD, [facility]  Electronically Signed    RB/MEDQ  D:  01/09/2009  T:  01/09/2009  Job:  [id number]   cc:   Dr. Arlyce Dice

## 2010-11-16 NOTE — Assessment & Plan Note (Signed)
OFFICE VISIT   NICOS, Steven Barnett  DOB:  1950/09/17                                        December 15, 2008  CHART #:  [id number]   HISTORY:  The patient is a 60 year old black male, who was recently  hospitalized from Nov 22, 2008, to Nov 27, 2008, at which time he  underwent a drainage and decortication of a right empyema by  thoracotomy.  During that hospitalization, he additionally had the new  diagnosis of atrial fibrillation.  Other significant diagnosis include  hypertension.  He is seen on today's date in routine office visit  followup following his thoracotomy.  Currently he reports that he  continues to have some chest discomfort on the right side similar to at  the time he was discharged and he continues to take frequent pain  medications.  He also has had some difficulty with constipation.  He has  a mild nonproductive cough.  He does feel as though there may be some  sputum, but he is unable to get this up.  He denies any further  tachypalpitations.  He denies any anginal symptoms or pain with the  exception of the incisional.   A chest x-ray was performed on today's date.  It reveals a stable  appearance compared to previous exam at time of discharge.  There  continues to be evidence of right pleural effusion and pleural  thickening.  There are no acute findings and no significant interval  change from the previous exam.   PHYSICAL EXAMINATION:  VITAL SIGNS:  Blood pressure is 150/100, pulse 76  and regular, respirations 20 and unlabored.  Oxygen saturation is 94% on  room air.  GENERAL APPEARANCE:  A 60 year old dull black male in no acute distress.  He does appear to be somewhat uncomfortable.  He is not obviously  dyspneic.  PULMONARY:  Coarse breath sounds throughout with diminished air movement  in the right lower lung fields.  There is no definite wheeze.  CARDIAC:  Regular rate and rhythm.  ABDOMEN:  Moderate distention.  Positive bowel  sounds.  Nontender to  palpation.  EXTREMITIES:  No edema.  SKIN:  Incisions are healing well without evidence of infection.   ASSESSMENT:  The patient is making slow ongoing recovery following his  drainage and decortication of a large right-sided empyema.  He does not  appear to be toxic.  He is having some difficulties with constipation.  I related this to him as a result at least in part to the analgesic  studies taken.  I have encouraged him to take oral laxatives.  He did  report that he had an enema last week with some results.  In regards to  his pulmonary status, I have encouraged him to continue his deep  breathing and pulmonary toilet.  He is to continue his current  medications.  In regard to his atrial fibrillation, he has a scheduled  appointment to see Dr. Cristopher Peru tomorrow.  There was a plan to begin  Coumadin on December 18, 2008, if he was then felt to be recovered  adequately from a surgical viewpoint.  Upon examination, he feels to be  very stable in this regard and I have told him that he can relate this  to Dr. Lovena Le that he can start the Coumadin on December 18, 2008.  We will  see him again in the office in 6 weeks with repeat chest x-ray at that  time.  We can see him again prior to that if he has any acute  difficulties on a p.r.n. basis.   John Giovanni, P.A.-C.   Loren Racer  D:  12/15/2008  T:  12/16/2008  Job:  [id number]   cc:   Jana Hakim, M.D.  Champ Mungo. Lovena Le, MD

## 2010-11-16 NOTE — Op Note (Signed)
NAMEKENZEL, Steven Barnett                ACCOUNT NO.:  [id number]   MEDICAL RECORD NO.:  [id number]          PATIENT TYPE:  INP   LOCATION:  2314                         FACILITY:  [facility]   PHYSICIAN:  Gilford Raid, M.D.     DATE OF BIRTH:  08/06/1950   DATE OF PROCEDURE:  11/22/2008  DATE OF DISCHARGE:                               OPERATIVE REPORT   PREOPERATIVE DIAGNOSIS:  Large right pleural empyema.   POSTOPERATIVE DIAGNOSIS:  Large right pleural empyema.   PROCEDURES:  Right thoracotomy, drainage of empyema, and decortication  of the entire right lung.   SURGEON:  Gilford Raid, MD   ANESTHESIA:  General endotracheal.   CLINICAL HISTORY:  This patient is a 60 year old black male who  initially presented with fever, cough, and shortness of breath in early  May 2010.  He had a chest x-ray showing a large right pleural effusion  and subsequently had a CT scan on Nov 10, 2008, showing a loculated  right pleural fluid collection suggesting possible empyema with  compression and atelectasis of the right lung.  The patient was treated  with a couple of courses of antibiotics without improvement in his  symptoms.  He developed progressive shortness of breath and was admitted  on Nov 19, 2008, to [facility].  He was noted to be in atrial  fibrillation at the time of admission and was seen by Cardiology.  He  was managing his atrial fibrillation.  He had another chest x-ray  performed, which continued to show a large loculated right pleural fluid  collection.  He had an attempted thoracentesis by Interventional  Radiology, but only about 3 mL of bloody fluid could be removed.  The  Gram-stain and culture of this was negative.  I was asked to see the  patient for further management.  After review of his CAT scan, I felt  the patient had a complex multiloculated right pleural empyema, which  have been present for several weeks.  I felt this would be very  organized with a thick  fibrinous peel on the lung and would require  right thoracotomy for drainage of the empyema and decortication of the  lung.  I did not think that VATS would be possible.  I discussed the  operative procedure with the patient and his family including but not  limited to bleeding, blood transfusion, infection, persistent space  problems requiring further management, prolonged air leak, respiratory  failure requiring prolonged mechanical ventilation, and possibility that  we may not be able to completely re-expand the right lung given the  amount of time since his original CT scan was performed.  They  understood all of this and agreed to proceed.   OPERATIVE PROCEDURE:  The patient was seen preoperatively in the holding  area and the proper patient, proper operative site, and proper operation  were confirmed with the patient.  The right side of the chest was signed  by me.  The patient was taken to the operating room and placed on the  table in supine position.  After induction of general endotracheal  anesthesia using a double-lumen tube, a Foley catheter was placed in  bladder using sterile technique.  Preoperative intravenous antibiotics  were given.  Lower extremity sequential compression devices were used.  The patient was then positioned in the left lateral decubitus position  with the right side up.  A right-sided chest was prepped with Betadine  soap and solution and draped in usual sterile manner.  The right lung  was deflated.  A time-out was taken and proper patient, proper operative  site, and proper operation were confirmed with nursing and anesthesia  staff.  Then, the right chest was opened through a right lateral  thoracotomy incision.  The patient is a very large man with a thick  chest wall.  It was necessary to divide the latissimus and serratus  muscles to provide adequate exposure.  Then, the chest was entered  through the sixth intercostal space.  Examination of the  pleural space  assured that there was a large multiloculated pleural fluid collection  with some bloody fluid present.  There was also a large amount of  fibrinous material and gelatinous material.  Cultures were taken.  The  loculations were broken up and all the fibrinous debris was removed and  the fluid drained.  The right lung was completely covered with a thick  fibrinous peel, which has already become quite organized.  The lung was  essentially completely trapped.  I was able to decorticate the entire  right lung, but this took a considerable amount of time and was very  tedious.  There were no significant injuries to the lung.  After  completing the decortication, the right chest was irrigated with warm  saline solution.  There was good hemostasis.  Then, the lung was  reinflated.  There were no significant air leaks.  Then, two 36-French  chest tubes were positioned with one posteriorly and one anteriorly.  The ribs were reapproximated with #2 Vicryl pericostal sutures.  The  muscles were closed in layers using continuous #1 Vicryl suture.  The  subcutaneous tissue was closed with continuous 2-0 Vicryl and the skin  with 3-0 Vicryl subcuticular closure.  The sponge, needles, and  instrument counts were correct according to the scrub nurse.  Dry  sterile dressing was applied around the chest tubes and over the  incision.  The chest tubes were connected to Pleur-Evac suction.  The  patient was then turned in the supine position and transported to the  [facility] intubated.      Gilford Raid, M.D.  Electronically Signed     BB/MEDQ  D:  11/22/2008  T:  11/23/2008  Job:  [id number]

## 2014-02-18 ENCOUNTER — Institutional Professional Consult (permissible substitution): Payer: Self-pay | Admitting: Internal Medicine

## 2014-02-19 ENCOUNTER — Encounter: Payer: Self-pay | Admitting: Internal Medicine

## 2014-04-02 ENCOUNTER — Institutional Professional Consult (permissible substitution) (INDEPENDENT_AMBULATORY_CARE_PROVIDER_SITE_OTHER): Payer: Medicaid Other | Admitting: Internal Medicine

## 2014-04-08 ENCOUNTER — Ambulatory Visit (INDEPENDENT_AMBULATORY_CARE_PROVIDER_SITE_OTHER): Payer: Medicaid Other | Admitting: Internal Medicine

## 2014-04-08 ENCOUNTER — Encounter: Payer: Self-pay | Admitting: Internal Medicine

## 2014-04-08 ENCOUNTER — Ambulatory Visit (INDEPENDENT_AMBULATORY_CARE_PROVIDER_SITE_OTHER): Payer: Medicaid Other | Admitting: *Deleted

## 2014-04-08 VITALS — BP 144/94 | HR 87 | Ht 70.0 in | Wt 301.0 lb

## 2014-04-08 DIAGNOSIS — R0683 Snoring: Secondary | ICD-10-CM

## 2014-04-08 DIAGNOSIS — M25562 Pain in left knee: Secondary | ICD-10-CM

## 2014-04-08 DIAGNOSIS — G4719 Other hypersomnia: Secondary | ICD-10-CM

## 2014-04-08 DIAGNOSIS — R5383 Other fatigue: Secondary | ICD-10-CM

## 2014-04-08 DIAGNOSIS — G4733 Obstructive sleep apnea (adult) (pediatric): Secondary | ICD-10-CM

## 2014-04-08 DIAGNOSIS — I482 Chronic atrial fibrillation, unspecified: Secondary | ICD-10-CM

## 2014-04-08 DIAGNOSIS — I481 Persistent atrial fibrillation: Secondary | ICD-10-CM

## 2014-04-08 DIAGNOSIS — I4821 Permanent atrial fibrillation: Secondary | ICD-10-CM

## 2014-04-08 DIAGNOSIS — M25561 Pain in right knee: Secondary | ICD-10-CM | POA: Insufficient documentation

## 2014-04-08 DIAGNOSIS — I4819 Other persistent atrial fibrillation: Secondary | ICD-10-CM

## 2014-04-08 DIAGNOSIS — I10 Essential (primary) hypertension: Secondary | ICD-10-CM

## 2014-04-08 DIAGNOSIS — I5032 Chronic diastolic (congestive) heart failure: Secondary | ICD-10-CM

## 2014-04-08 LAB — POCT INR: INR: 2.6

## 2014-04-08 NOTE — Patient Instructions (Signed)
Your physician recommends that you schedule a follow-up appointment in: Kristin PharmD at the Coumadin Clinic in One week.  You have been referred to [facility] for your knee pain.  Your physician has recommended that you have a sleep study. This test records several body functions during sleep, including: brain activity, eye movement, oxygen and carbon dioxide blood levels, heart rate and rhythm, breathing rate and rhythm, the flow of air through your mouth and nose, snoring, body muscle movements, and chest and belly movement.  Your physician recommends that you schedule a follow-up appointment in: Dr. [name] in 6 months.

## 2014-04-08 NOTE — Progress Notes (Signed)
OFFICE NOTE  Chief Complaint:  Re-establish with cardiologist  Primary Care Physician: Pcp Not In System  HPI:  Steven Barnett is a 63 year old morbidly obese African American male with a history significant for hypertension, sleep disorder consistent with obstructive sleep apnea, recent diagnosis of diastolic heart failure with moderate LVH and recent diagnosis of postoperative atrial fibrillation (continues to be in atrial fibrillation) presenting with atypical chest pain. The patient was hospitalized in May 2010 and diagnosed with a right-sided empyema, for which he underwent a thoracotomy. He was found to be in atrial fibrillation postoperatively and has been in this rhythm ever since. At that time, he was also diagnosed with diastolic heart failure and found to have moderate left ventricular hypertrophy by echocardiogram.   Unfortunately, he was incarcerated for the past several years and has not been followed by cardiologist. His warfarin has been continued while in jail however was not adjusted regularly. He recently saw a new primary care provider with [facility] regional physicians and he is here to reestablish cardiac care and followup of his warfarin. He remains in atrial fibrillation today. He reports shortness of breath which is unchanged.  He denies any chest pain. He does report some leg swelling. He has problems with his knees from arthritis and pain. He also has not been able to lose weight. Finally, he has a history of sleep apnea but was never treated for this. He reports poor sleep at night and daytime fatigue and sometimes feels like he doesn't want to fall asleep because he feels like he is going to stop breathing. There is witnessed apnea.  PMHx:  Past Medical History  Diagnosis Date  . Hypertension   . Atrial fibrillation     History reviewed. No pertinent past surgical history.  FAMHx:  Family History  Problem Relation Age of Onset  . Hypertension Mother   . Diabetes  Mother   . Hyperlipidemia Mother   . Heart disease Father     SOCHx:   has no tobacco, alcohol, and drug history on file.  ALLERGIES:  No Known Allergies  ROS: A comprehensive review of systems was negative except for: Constitutional: positive for fatigue Respiratory: positive for dyspnea on exertion Cardiovascular: positive for irregular heart beat and lower extremity edema Musculoskeletal: positive for knee pain  HOME MEDS: Current Outpatient Prescriptions  Medication Sig Dispense Refill  . acetaminophen (TYLENOL) 650 MG CR tablet Take 1,300-2,600 mg by mouth 2 (two) times daily.      Marland Kitchen aspirin 81 MG tablet Take 81 mg by mouth daily.      Marland Kitchen Dextromethorphan-Guaifenesin (MUCINEX DM MAXIMUM STRENGTH) 60-1200 MG TB12 Take 1 tablet by mouth every 12 (twelve) hours as needed.      . diltiazem (CARDIZEM) 120 MG tablet Take 120 mg by mouth daily.      . DiphenhydrAMINE HCl, Sleep, 25 MG CAPS Take by mouth at bedtime as needed.      . furosemide (LASIX) 40 MG tablet Take 40 mg by mouth 2 (two) times daily.      . hydrALAZINE (APRESOLINE) 50 MG tablet Take 50 mg by mouth 2 (two) times daily.      Marland Kitchen ipratropium (ATROVENT HFA) 17 MCG/ACT inhaler Inhale 2 puffs into the lungs as needed for wheezing.      Marland Kitchen lisinopril (PRINIVIL,ZESTRIL) 20 MG tablet Take 20 mg by mouth daily.      . metoprolol (LOPRESSOR) 50 MG tablet Take 50 mg by mouth 2 (two) times daily.      Marland Kitchen  warfarin (COUMADIN) 2 MG tablet Take 2 mg by mouth as directed.       No current facility-administered medications for this visit.    LABS/IMAGING: No results found for this or any previous visit (from the past 48 hour(s)). No results found.  VITALS: BP 144/94  Pulse 87  Ht 5\' 10"  (1.778 m)  Wt 301 lb (136.533 kg)  BMI 43.19 kg/m2  EXAM: General appearance: alert, no distress and morbidly obese Neck: no carotid bruit and no JVD Lungs: clear to auscultation bilaterally Heart: regular rate and rhythm, S1, S2 normal, no  murmur, click, rub or gallop Abdomen: soft, non-tender; bowel sounds normal; no masses,  no organomegaly Extremities: edema 1+ bilateral pitting edema Pulses: 2+ and symmetric Skin: Skin color, texture, turgor normal. No rashes or lesions Neurologic: Grossly normal Psych: Pleasant  EKG: Atrial fibrillation with controlled ventricular response at 87  ASSESSMENT: 1. Permanent atrial fibrillation 2. Morbid obesity 3. Obstructive sleep apnea-not on CPAP 4. Hypertension controlled 5. Leg edema 6. Bilateral knee osteoarthritis  PLAN: 1.   [name] is reestablishing care after he has gotten out of incarceration. His INR is asked a therapeutic today. For the last 2 days she took 6 mg and then 8 mg. Will continue 6 and 8 mg alternating and recheck his INR next week and establish him with Erasmo Downer in our anticoagulation pharmacist. It this point he is in permanent atrial fibrillation most likely because he is had symptoms and recurrent A. fib since 2010. He also has sleep apnea which is previously diagnosed but has not been fitted with a mask. He will need to be restudied and likely fitted for CPAP. His hypertension is fairly well controlled. He is on Lasix but does have a small amount of bilateral leg edema. There is likely an element of venous stasis and probably deep venous reflux. No superficial varicosities are noted. He is having pain in both of his knees consistent with bilateral knee osteoarthritis. The difficulty walking makes it difficult for him to lose weight. I like him to Dr. supple at [facility] orthopedics for further evaluation.  Plan to see him back in 6 months.  Pixie Casino, MD, [facility] Attending Cardiologist [facility]  Deniya Craigo C 04/08/2014, 9:51 AM

## 2014-04-14 ENCOUNTER — Ambulatory Visit: Payer: Medicaid Other | Admitting: Pharmacist Clinician (PhC)/ Clinical Pharmacy Specialist

## 2014-06-13 ENCOUNTER — Telehealth: Payer: Self-pay | Admitting: Internal Medicine

## 2014-06-13 MED ORDER — DILTIAZEM HCL 120 MG PO TABS: 120.0000 mg | ORAL_TABLET | Freq: Every day | ORAL | Status: DC

## 2014-06-13 MED ORDER — METOPROLOL TARTRATE 50 MG PO TABS: 50.0000 mg | ORAL_TABLET | Freq: Two times a day (BID) | ORAL | Status: DC

## 2014-06-13 NOTE — Telephone Encounter (Signed)
Pt called in needing his Warfarin,Metoprolol, and Diltiazem sent to [facility] for refills. Please call  Thanks

## 2014-06-13 NOTE — Telephone Encounter (Signed)
Routed to [facility] to fill warfarin.

## 2014-06-16 ENCOUNTER — Other Ambulatory Visit: Payer: Self-pay | Admitting: Pharmacist Clinician (PhC)/ Clinical Pharmacy Specialist

## 2014-06-16 ENCOUNTER — Ambulatory Visit (INDEPENDENT_AMBULATORY_CARE_PROVIDER_SITE_OTHER): Payer: Medicaid Other | Admitting: Pharmacist Clinician (PhC)/ Clinical Pharmacy Specialist

## 2014-06-16 DIAGNOSIS — Z7901 Long term (current) use of anticoagulants: Secondary | ICD-10-CM

## 2014-06-16 DIAGNOSIS — I4821 Permanent atrial fibrillation: Secondary | ICD-10-CM

## 2014-06-16 DIAGNOSIS — I482 Chronic atrial fibrillation: Secondary | ICD-10-CM

## 2014-06-16 LAB — POCT INR: INR: 1.7

## 2014-06-16 MED ORDER — WARFARIN SODIUM 2 MG PO TABS: ORAL_TABLET | ORAL | Status: DC

## 2014-06-16 MED ORDER — WARFARIN SODIUM 4 MG PO TABS
ORAL_TABLET | ORAL | Status: DC
Start: 2014-06-16 — End: 2014-06-18

## 2014-06-18 ENCOUNTER — Telehealth: Payer: Self-pay | Admitting: Internal Medicine

## 2014-06-18 MED ORDER — WARFARIN SODIUM 2 MG PO TABS: ORAL_TABLET | ORAL | Status: DC

## 2014-06-18 MED ORDER — WARFARIN SODIUM 4 MG PO TABS: ORAL_TABLET | ORAL | Status: DC

## 2014-06-18 NOTE — Telephone Encounter (Signed)
Pt says pharmacist said they need an authorization for his [place] of them. Please call this to [facility] on [facility].Please call this in today if possible.

## 2014-06-22 ENCOUNTER — Ambulatory Visit (HOSPITAL_BASED_OUTPATIENT_CLINIC_OR_DEPARTMENT_OTHER): Payer: Medicaid Other | Attending: Internal Medicine

## 2014-07-02 ENCOUNTER — Telehealth: Payer: Self-pay | Admitting: *Deleted

## 2014-07-02 ENCOUNTER — Telehealth: Payer: Self-pay | Admitting: Internal Medicine

## 2014-07-02 MED ORDER — LISINOPRIL 20 MG PO TABS: 20.0000 mg | ORAL_TABLET | Freq: Every day | ORAL | Status: DC

## 2014-07-02 MED ORDER — FUROSEMIDE 40 MG PO TABS: 40.0000 mg | ORAL_TABLET | Freq: Two times a day (BID) | ORAL | Status: DC

## 2014-07-02 NOTE — Telephone Encounter (Signed)
Pt called in stating that he needs new prescriptions for furosemide ans lisinopril called in to the [facility] on [address]. Please call  Thanks

## 2014-07-02 NOTE — Telephone Encounter (Signed)
LISINOPRIL AND LASIX REORDERED

## 2014-07-04 DIAGNOSIS — G4733 Obstructive sleep apnea (adult) (pediatric): Secondary | ICD-10-CM

## 2014-07-04 HISTORY — DX: Obstructive sleep apnea (adult) (pediatric): G47.33

## 2014-07-07 MED ORDER — HYDRALAZINE HCL 50 MG PO TABS
50.0000 mg | ORAL_TABLET | Freq: Two times a day (BID) | ORAL | Status: DC
Start: 2014-07-07 — End: 2015-07-27

## 2014-07-07 NOTE — Telephone Encounter (Signed)
Rx(s) sent to pharmacy electronically.  

## 2014-07-07 NOTE — Telephone Encounter (Signed)
Pt need  His prescription for his Hydralazine 50 mg #60.Please call this to [phone].

## 2014-07-14 ENCOUNTER — Ambulatory Visit: Payer: Medicaid Other | Admitting: Pharmacist Clinician (PhC)/ Clinical Pharmacy Specialist

## 2014-08-12 ENCOUNTER — Ambulatory Visit (INDEPENDENT_AMBULATORY_CARE_PROVIDER_SITE_OTHER): Payer: Medicaid Other | Admitting: Internal Medicine

## 2014-08-12 ENCOUNTER — Encounter: Payer: Self-pay | Admitting: Internal Medicine

## 2014-08-12 ENCOUNTER — Ambulatory Visit (INDEPENDENT_AMBULATORY_CARE_PROVIDER_SITE_OTHER): Payer: Medicaid Other | Admitting: *Deleted

## 2014-08-12 VITALS — BP 132/70 | HR 70 | Ht 70.0 in | Wt 307.8 lb

## 2014-08-12 DIAGNOSIS — I10 Essential (primary) hypertension: Secondary | ICD-10-CM

## 2014-08-12 DIAGNOSIS — N528 Other male erectile dysfunction: Secondary | ICD-10-CM

## 2014-08-12 DIAGNOSIS — I482 Chronic atrial fibrillation: Secondary | ICD-10-CM

## 2014-08-12 DIAGNOSIS — N529 Male erectile dysfunction, unspecified: Secondary | ICD-10-CM | POA: Insufficient documentation

## 2014-08-12 DIAGNOSIS — G4733 Obstructive sleep apnea (adult) (pediatric): Secondary | ICD-10-CM

## 2014-08-12 DIAGNOSIS — Z7901 Long term (current) use of anticoagulants: Secondary | ICD-10-CM

## 2014-08-12 DIAGNOSIS — I4821 Permanent atrial fibrillation: Secondary | ICD-10-CM

## 2014-08-12 DIAGNOSIS — I5023 Acute on chronic systolic (congestive) heart failure: Secondary | ICD-10-CM

## 2014-08-12 LAB — POCT INR: INR: 2.3

## 2014-08-12 MED ORDER — VIAGRA 50 MG PO TABS: 50.0000 mg | ORAL_TABLET | Freq: Every day | ORAL | Status: DC | PRN

## 2014-08-12 NOTE — Progress Notes (Signed)
Marland Kitchen    OFFICE NOTE   Chief Complaint:  ED  Primary Care Physician: Steven Barnett  HPI:  Steven Barnett is a 64 year old morbidly obese African American male with a history significant for hypertension, sleep disorder consistent with obstructive sleep apnea, recent diagnosis of diastolic heart failure with moderate LVH and recent diagnosis of postoperative atrial fibrillation (continues to be in atrial fibrillation) presenting with atypical chest pain. The patient was hospitalized in May 2010 and diagnosed with a right-sided empyema, for which he underwent a thoracotomy. He was found to be in atrial fibrillation postoperatively and has been in this rhythm ever since. At that time, he was also diagnosed with diastolic heart failure and found to have moderate left ventricular hypertrophy by echocardiogram.   Unfortunately, he was incarcerated for the past several years and has not been followed by cardiologist. His warfarin has been continued while in jail however was not adjusted regularly. He recently saw a new primary care provider with [facility] regional physicians and he is here to reestablish cardiac care and followup of his warfarin. He remains in atrial fibrillation today. He reports shortness of breath which is unchanged.  He denies any chest pain. He does report some leg swelling. He has problems with his knees from arthritis and pain. He also has not been able to lose weight. Finally, he has a history of sleep apnea but was never treated for this. He reports poor sleep at night and daytime fatigue and sometimes feels like he doesn't want to fall asleep because he feels like he is going to stop breathing. There is witnessed apnea.  I saw Steven Barnett in the office today. His main complaints are with erectile dysfunction. He is interested in Viagra. Denies any chest pain and reports good sleep. He is therapeutic on warfarin and is in permanent atrial fibrillation.  PMHx:  Past Medical  History  Diagnosis Date  . Hypertension   . Atrial fibrillation     No past surgical history on file.  FAMHx:  Family History  Problem Relation Age of Onset  . Hypertension Mother   . Diabetes Mother   . Hyperlipidemia Mother   . Heart disease Father     SOCHx:   reports that he has never smoked. He does not have any smokeless tobacco history on file. His alcohol and drug histories are not on file.  ALLERGIES:  No Known Allergies  ROS: A comprehensive review of systems was negative except for: Constitutional: positive for fatigue Respiratory: positive for dyspnea on exertion Cardiovascular: positive for irregular heart beat and lower extremity edema Genitourinary: positive for sexual problems Musculoskeletal: positive for knee pain  HOME MEDS: Current Outpatient Prescriptions  Medication Sig Dispense Refill  . acetaminophen (TYLENOL) 650 MG CR tablet Take 1,300-2,600 mg by mouth 2 (two) times daily.    Marland Kitchen aspirin 81 MG tablet Take 81 mg by mouth daily.    Marland Kitchen Dextromethorphan-Guaifenesin (MUCINEX DM MAXIMUM STRENGTH) 60-1200 MG TB12 Take 1 tablet by mouth every 12 (twelve) hours as needed.    . diltiazem (CARDIZEM) 120 MG tablet Take 1 tablet (120 mg total) by mouth daily. 90 tablet 2  . DiphenhydrAMINE HCl, Sleep, 25 MG CAPS Take by mouth at bedtime as needed.    . furosemide (LASIX) 40 MG tablet Take 1 tablet (40 mg total) by mouth 2 (two) times daily. 30 tablet 6  . hydrALAZINE (APRESOLINE) 50 MG tablet Take 1 tablet (50 mg total) by mouth 2 (two) times daily.  60 tablet 9  . ipratropium (ATROVENT HFA) 17 MCG/ACT inhaler Inhale 2 puffs into the lungs as needed for wheezing.    Marland Kitchen lisinopril (PRINIVIL,ZESTRIL) 20 MG tablet Take 1 tablet (20 mg total) by mouth daily. 30 tablet 6  . metoprolol (LOPRESSOR) 50 MG tablet Take 1 tablet (50 mg total) by mouth 2 (two) times daily. 180 tablet 2  . warfarin (COUMADIN) 2 MG tablet Take 1 tablet by mouth in addition to 4mg  tablets as  directed by coumadin clinic 30 tablet 1  . warfarin (COUMADIN) 4 MG tablet Take 1-2 tablets by mouth daily as directed by coumadin clinic 50 tablet 1  . VIAGRA 50 MG tablet Take 1 tablet (50 mg total) by mouth daily as needed for erectile dysfunction. 10 tablet 2   No current facility-administered medications for this visit.    LABS/IMAGING: No results found for this or any previous visit (from the past 48 hour(s)). No results found.  VITALS: BP 132/70 mmHg  Pulse 70  Ht 5\' 10"  (1.778 m)  Wt 307 lb 12.8 oz (139.617 kg)  BMI 44.16 kg/m2  EXAM: General appearance: alert, no distress and morbidly obese Neck: no carotid bruit and no JVD Lungs: clear to auscultation bilaterally Heart: regular rate and rhythm, S1, S2 normal, no murmur, click, rub or gallop Abdomen: soft, non-tender; bowel sounds normal; no masses,  no organomegaly Extremities: edema 1+ bilateral pitting edema Pulses: 2+ and symmetric Skin: Skin color, texture, turgor normal. No rashes or lesions Neurologic: Grossly normal Psych: Pleasant  EKG: Atrial fibrillation at 70  ASSESSMENT: 1. Permanent atrial fibrillation 2. Morbid obesity 3. Obstructive sleep apnea-not on CPAP 4. Hypertension controlled 5. Leg edema 6. Bilateral knee osteoarthritis 7. Erectile dysfunction  PLAN: 1.   Steven Barnett returns for follow-up today. It's unclear whether he reestablished care and was seen for evaluation of sleep apnea. He is in permanent A. fib and his INRs have been therapeutic. His main concern today is erectile dysfunction. He's had this issue in the past as have success with Viagra. He is interested in a prescription for that today and wanted brand name Viagra. I've gone ahead and prescribed that for him. He is advised not to use more than 1 tablet 24 hours and not to use it concomitantly with nitroglycerin.  Plan to see him back in 6 months.  Pixie Casino, MD, [facility] Attending Cardiologist [facility]  HILTY,Kenneth C 08/12/2014, 2:25 PM

## 2014-08-12 NOTE — Patient Instructions (Addendum)
PLEASE CONTINUE YOUR CURRENT DOSE OF WARFARIN  >> PLEASE SCHEDULE A REPEAT INR IN 4 WEEKS  Dr. [name] has prescribed Viagra 50mg  - take 1 tablet by mouth once daily as needed.  Medication samples have been provided to the patient.  Drug name: Viagra  Qty: 2  LOT: [id number]  Exp.Date: 06/2015  Your physician wants you to follow-up in: 6 months with Dr. [name]. You will receive a reminder letter in the mail two months in advance. If you don't receive a letter, please call our office to schedule the follow-up appointment.

## 2014-09-07 ENCOUNTER — Other Ambulatory Visit: Payer: Self-pay | Admitting: Pharmacist Clinician (PhC)/ Clinical Pharmacy Specialist

## 2014-09-07 ENCOUNTER — Other Ambulatory Visit: Payer: Self-pay | Admitting: Internal Medicine

## 2014-09-19 ENCOUNTER — Telehealth: Payer: Self-pay | Admitting: Internal Medicine

## 2014-09-19 MED ORDER — WARFARIN SODIUM 2 MG PO TABS: ORAL_TABLET | ORAL | Status: DC

## 2014-09-19 MED ORDER — METOPROLOL TARTRATE 50 MG PO TABS: 50.0000 mg | ORAL_TABLET | Freq: Two times a day (BID) | ORAL | Status: DC

## 2014-09-19 MED ORDER — WARFARIN SODIUM 4 MG PO TABS: ORAL_TABLET | ORAL | Status: DC

## 2014-09-19 NOTE — Telephone Encounter (Signed)
Refilled lopressor. Deferring to [facility] to [facility] coumadin.

## 2014-09-19 NOTE — Telephone Encounter (Signed)
°  1. Which medications need to be refilled? Warfarin 4 mg and  2mg  Metoprolol 2. Which pharmacy is medication to be sent to?[facility][phone]  3. Do they need a 30 day or 90 day supply? 90 and refills  4. Would they like a call back once the medication has been sent to the pharmacy? no

## 2014-09-19 NOTE — Telephone Encounter (Signed)
Patient past due for INR check.  Phone number listed was wrong # per person answering.  Home number busy after multiple attempts

## 2014-10-17 ENCOUNTER — Telehealth: Payer: Self-pay | Admitting: Internal Medicine

## 2014-10-17 MED ORDER — SILDENAFIL CITRATE 20 MG PO TABS: ORAL_TABLET | ORAL | Status: DC

## 2014-10-17 MED ORDER — VIAGRA 50 MG PO TABS: 50.0000 mg | ORAL_TABLET | Freq: Every day | ORAL | Status: DC | PRN

## 2014-10-17 NOTE — Telephone Encounter (Signed)
°  1. Which medications need to be refilled? Viagara-new prescription,pt changing pharmacy  2. Which pharmacy is medication to be sent to?[facility]-[phone]  3. Do they need a 30 day or 90 day supply? 30 ad refills  4. Would they like a call back once the medication has been sent to the pharmacy? yes

## 2014-10-17 NOTE — Telephone Encounter (Signed)
Refill submitted to patient's preferred pharmacy. Informed patient. Pt voiced understanding, no other stated concerns at this time.  

## 2014-11-04 ENCOUNTER — Other Ambulatory Visit: Payer: Self-pay | Admitting: Internal Medicine

## 2014-12-09 ENCOUNTER — Other Ambulatory Visit: Payer: Self-pay | Admitting: Internal Medicine

## 2014-12-09 MED ORDER — VIAGRA 50 MG PO TABS: 50.0000 mg | ORAL_TABLET | Freq: Every day | ORAL | Status: DC | PRN

## 2014-12-09 NOTE — Telephone Encounter (Signed)
Routed to Dr [name] to approve/deny

## 2014-12-09 NOTE — Telephone Encounter (Signed)
°  1. Which medications need to be refilled? Generic Viagra-new prescription 2. Which pharmacy is medication to be sent to?[facility] 3. Do they need a 30 day or 90 day supply? 30 and refills  4. Would they like a call back once the medication has been sent to the pharmacy? yes

## 2014-12-16 ENCOUNTER — Encounter: Payer: Self-pay | Admitting: Internal Medicine

## 2014-12-16 ENCOUNTER — Ambulatory Visit (INDEPENDENT_AMBULATORY_CARE_PROVIDER_SITE_OTHER): Payer: Medicaid Other | Admitting: Pharmacist Clinician (PhC)/ Clinical Pharmacy Specialist

## 2014-12-16 ENCOUNTER — Ambulatory Visit (INDEPENDENT_AMBULATORY_CARE_PROVIDER_SITE_OTHER): Payer: Medicaid Other | Admitting: Internal Medicine

## 2014-12-16 VITALS — BP 138/90 | HR 69 | Ht 70.0 in | Wt 298.2 lb

## 2014-12-16 DIAGNOSIS — I4821 Permanent atrial fibrillation: Secondary | ICD-10-CM

## 2014-12-16 DIAGNOSIS — Z7901 Long term (current) use of anticoagulants: Secondary | ICD-10-CM | POA: Diagnosis not present

## 2014-12-16 DIAGNOSIS — I482 Chronic atrial fibrillation: Secondary | ICD-10-CM | POA: Diagnosis not present

## 2014-12-16 DIAGNOSIS — I4819 Other persistent atrial fibrillation: Secondary | ICD-10-CM

## 2014-12-16 DIAGNOSIS — Z1211 Encounter for screening for malignant neoplasm of colon: Secondary | ICD-10-CM | POA: Diagnosis not present

## 2014-12-16 DIAGNOSIS — N528 Other male erectile dysfunction: Secondary | ICD-10-CM

## 2014-12-16 DIAGNOSIS — I481 Persistent atrial fibrillation: Secondary | ICD-10-CM | POA: Diagnosis not present

## 2014-12-16 DIAGNOSIS — I10 Essential (primary) hypertension: Secondary | ICD-10-CM

## 2014-12-16 DIAGNOSIS — G4733 Obstructive sleep apnea (adult) (pediatric): Secondary | ICD-10-CM

## 2014-12-16 LAB — POCT INR: INR: 1.3

## 2014-12-16 MED ORDER — SILDENAFIL CITRATE 20 MG PO TABS: ORAL_TABLET | ORAL | Status: DC

## 2014-12-16 NOTE — Patient Instructions (Signed)
You have been referred to Dr. Carol Ada - gastroenterologist - for colonscopy  Your sildenafil has been refilled to [facility] wants you to follow-up in: 6 months with Dr. [name]. You will receive a reminder letter in the mail two months in advance. If you don't receive a letter, please call our office to schedule the follow-up appointment.

## 2014-12-16 NOTE — Progress Notes (Signed)
Marland Kitchen    OFFICE NOTE   Chief Complaint:  Preoperative for colonoscopy  Primary Care Physician: Steven Cara, PA-C  HPI:  Steven Barnett is a 64 year old morbidly obese African American male with a history significant for hypertension, sleep disorder consistent with obstructive sleep apnea, recent diagnosis of diastolic heart failure with moderate LVH and recent diagnosis of postoperative atrial fibrillation (continues to be in atrial fibrillation) presenting with atypical chest pain. The patient was hospitalized in May 2010 and diagnosed with a right-sided empyema, for which he underwent a thoracotomy. He was found to be in atrial fibrillation postoperatively and has been in this rhythm ever since. At that time, he was also diagnosed with diastolic heart failure and found to have moderate left ventricular hypertrophy by echocardiogram.   Unfortunately, he was incarcerated for the past several years and has not been followed by cardiologist. His warfarin has been continued while in jail however was not adjusted regularly. He recently saw a new primary care provider with [facility] regional physicians and he is here to reestablish cardiac care and followup of his warfarin. He remains in atrial fibrillation today. He reports shortness of breath which is unchanged.  He denies any chest pain. He does report some leg swelling. He has problems with his knees from arthritis and pain. He also has not been able to lose weight. Finally, he has a history of sleep apnea but was never treated for this. He reports poor sleep at night and daytime fatigue and sometimes feels like he doesn't want to fall asleep because he feels like he is going to stop breathing. There is witnessed apnea.  I saw Steven Barnett in the office today. His main complaints are with erectile dysfunction. He is interested in Viagra. Denies any chest pain and reports good sleep. He is therapeutic on warfarin and is in permanent atrial  fibrillation.  Steven Barnett returns to the office today. He is inquiring about getting generic sildenafil as Viagra was not covered. He also wants to be referred for colonoscopy. I told him that would be up to his primary care provider but he said "I thought you were my primary care provider". I clarified the differences in of encouraged him to follow-up with his primary care provider with [facility] physicians. We will go ahead and refer him for screening colonoscopy. He was advised that he need to hold warfarin for least 5 days prior to colonoscopy.  PMHx:  Past Medical History  Diagnosis Date  . Hypertension   . Atrial fibrillation     No past surgical history on file.  FAMHx:  Family History  Problem Relation Age of Onset  . Hypertension Mother   . Diabetes Mother   . Hyperlipidemia Mother   . Heart disease Father     SOCHx:   reports that he quit smoking about 40 years ago. He has never used smokeless tobacco. He reports that he does not drink alcohol or use illicit drugs.  ALLERGIES:  No Known Allergies  ROS: A comprehensive review of systems was negative except for: Constitutional: positive for fatigue Respiratory: positive for dyspnea on exertion Cardiovascular: positive for irregular heart beat and lower extremity edema Genitourinary: positive for sexual problems Musculoskeletal: positive for knee pain  HOME MEDS: Current Outpatient Prescriptions  Medication Sig Dispense Refill  . acetaminophen (TYLENOL) 650 MG CR tablet Take 1,300-2,600 mg by mouth 2 (two) times daily.    Marland Kitchen aspirin 81 MG tablet Take 81 mg by mouth daily.    Marland Kitchen  Dextromethorphan-Guaifenesin (MUCINEX DM MAXIMUM STRENGTH) 60-1200 MG TB12 Take 1 tablet by mouth every 12 (twelve) hours as needed.    . diltiazem (CARDIZEM) 120 MG tablet Take 1 tablet (120 mg total) by mouth daily. 90 tablet 2  . DiphenhydrAMINE HCl, Sleep, 25 MG CAPS Take by mouth at bedtime as needed.    . furosemide (LASIX) 40 MG tablet TAKE ONE  TABLET BY MOUTH TWICE DAILY 30 tablet 3  . hydrALAZINE (APRESOLINE) 50 MG tablet Take 1 tablet (50 mg total) by mouth 2 (two) times daily. 60 tablet 9  . ipratropium (ATROVENT HFA) 17 MCG/ACT inhaler Inhale 2 puffs into the lungs as needed for wheezing.    Marland Kitchen lisinopril (PRINIVIL,ZESTRIL) 20 MG tablet Take 1 tablet (20 mg total) by mouth daily. 30 tablet 6  . metoprolol (LOPRESSOR) 50 MG tablet Take 1 tablet (50 mg total) by mouth 2 (two) times daily. 180 tablet 3  . sildenafil (REVATIO) 20 MG tablet Take 2-3 tablets by mouth daily as needed for sexual activity. 30 tablet 0  . warfarin (COUMADIN) 2 MG tablet Take 1 tablet by mouth in addition to 4mg  tablets as directed by coumadin clinic.  No refills until INR check 20 tablet 0  . warfarin (COUMADIN) 4 MG tablet Take 1-2 tablets by mouth daily as directed by coumadin clinic 40 tablet 0   No current facility-administered medications for this visit.    LABS/IMAGING: No results found for this or any previous visit (from the past 48 hour(s)). No results found.  VITALS: BP 138/90 mmHg  Pulse 69  Ht 5\' 10"  (1.778 m)  Wt 298 lb 3.2 oz (135.263 kg)  BMI 42.79 kg/m2  EXAM: General appearance: alert, no distress and morbidly obese Neck: no carotid bruit and no JVD Lungs: clear to auscultation bilaterally Heart: regular rate and rhythm, S1, S2 normal, no murmur, click, rub or gallop Abdomen: soft, non-tender; bowel sounds normal; no masses,  no organomegaly Extremities: edema 1+ bilateral pitting edema Pulses: 2+ and symmetric Skin: Skin color, texture, turgor normal. No rashes or lesions Neurologic: Grossly normal Psych: Pleasant  EKG: Atrial fibrillation at 69  ASSESSMENT: 1. Permanent atrial fibrillation 2. Morbid obesity 3. Obstructive sleep apnea-not on CPAP 4. Hypertension controlled 5. Leg edema 6. Bilateral knee osteoarthritis 7. Erectile dysfunction  PLAN: 1.   Steven Barnett is interested in a screening colonoscopy. I'll  refer him for this. He will have to hold warfarin for 5 days prior to colonoscopy. He did have a check of his INR today but had not followed up for INR checks since February of this year. He was advised that he needs to have this checked at least every 6 weeks and if he cannot comply with that we may need to consider taking him off of warfarin because of potential dangers. Blood pressure is controlled. He remains in permanent A. fib with rate control. I do not believe is compliant with CPAP.  Plan to see him back in 6 months.  Pixie Casino, MD, [facility] Attending Cardiologist [facility] Inclan 12/16/2014, 5:52 PM

## 2014-12-19 ENCOUNTER — Other Ambulatory Visit: Payer: Self-pay | Admitting: Internal Medicine

## 2014-12-19 NOTE — Telephone Encounter (Signed)
°  1. Which medications need to be refilled? Warfarin 4 mg  2. Which pharmacy is medication to be sent to?[facility][phone]  3. Do they need a 30 day or 90 day supply?  40 and refills  4. Would they like a call back once the medication has been sent to the pharmacy? yes

## 2014-12-24 ENCOUNTER — Other Ambulatory Visit: Payer: Self-pay | Admitting: Internal Medicine

## 2014-12-24 NOTE — Telephone Encounter (Signed)
°  1. Which medications need to be refilled? Warfarin 2 mg  2. Which pharmacy is medication to be sent to?[facility][phone]  3. Do they need a 30 day or 90 day supply? 90 and refills  4. Would they like a call back once the medication has been sent to the pharmacy? yes

## 2014-12-24 NOTE — Telephone Encounter (Signed)
Rx sent for 30 days and no refills.  Pt has a history of non-compliance and must show up for appts on a regular basis before we will send in that many tablets.

## 2014-12-31 ENCOUNTER — Ambulatory Visit (INDEPENDENT_AMBULATORY_CARE_PROVIDER_SITE_OTHER): Payer: Medicaid Other | Admitting: Pharmacist Clinician (PhC)/ Clinical Pharmacy Specialist

## 2014-12-31 DIAGNOSIS — Z7901 Long term (current) use of anticoagulants: Secondary | ICD-10-CM

## 2014-12-31 DIAGNOSIS — I482 Chronic atrial fibrillation: Secondary | ICD-10-CM | POA: Diagnosis not present

## 2014-12-31 DIAGNOSIS — I4821 Permanent atrial fibrillation: Secondary | ICD-10-CM

## 2014-12-31 LAB — POCT INR: INR: 2

## 2015-01-15 ENCOUNTER — Telehealth: Payer: Self-pay | Admitting: *Deleted

## 2015-01-15 NOTE — Telephone Encounter (Signed)
Patient walked in and provided recent lab results and info from his life insurance regarding elevated pro-BNP levels. Dr. [name] reviewed and advised that if patient is SOB, would order 2D echocardiogram to assess for cause.   Spoke with patient - he denies shortness of breath, weight gain, edema.   No further action necessary   Will mail patient's letter and labs back to him for his personal records. Copy of labs made for medical file.

## 2015-01-16 ENCOUNTER — Encounter: Payer: Self-pay | Admitting: Internal Medicine

## 2015-01-19 ENCOUNTER — Other Ambulatory Visit: Payer: Self-pay | Admitting: Internal Medicine

## 2015-01-19 MED ORDER — SILDENAFIL CITRATE 20 MG PO TABS: ORAL_TABLET | ORAL | Status: DC

## 2015-01-19 NOTE — Telephone Encounter (Signed)
Ok to refill.  Dr. H 

## 2015-01-19 NOTE — Telephone Encounter (Signed)
°  1. Which medications need to be refilled? Sidenifil and Warfarin   2. Which pharmacy is medication to be sent to?[facility] on [address]   3. Do they need a 30 day or 90 day supply?  30  4. Would they like a call back once the medication has been sent to the pharmacy? Yes

## 2015-01-23 ENCOUNTER — Encounter: Payer: Self-pay | Admitting: *Deleted

## 2015-01-23 NOTE — Telephone Encounter (Signed)
Patient and daughter walked in with same letter and lab results as previously reviewed. They state they need a letter composed stating the info was reviewed by an MD and no further eval was necessary. Letter composed and given to patient.

## 2015-01-28 ENCOUNTER — Ambulatory Visit: Payer: Medicaid Other | Admitting: Pharmacist Clinician (PhC)/ Clinical Pharmacy Specialist

## 2015-02-04 ENCOUNTER — Other Ambulatory Visit: Payer: Self-pay | Admitting: Internal Medicine

## 2015-02-05 NOTE — Telephone Encounter (Signed)
REFILL 

## 2015-02-10 ENCOUNTER — Telehealth: Payer: Self-pay | Admitting: Physician Assistant

## 2015-02-10 ENCOUNTER — Ambulatory Visit (INDEPENDENT_AMBULATORY_CARE_PROVIDER_SITE_OTHER): Payer: Medicaid Other | Admitting: Physician Assistant

## 2015-02-10 ENCOUNTER — Encounter: Payer: Self-pay | Admitting: Physician Assistant

## 2015-02-10 ENCOUNTER — Ambulatory Visit
Admission: RE | Admit: 2015-02-10 | Discharge: 2015-02-10 | Disposition: A | Discharge status: Home / Self Care | Payer: Medicaid Other | Source: Ambulatory Visit | Attending: Physician Assistant | Admitting: Physician Assistant

## 2015-02-10 ENCOUNTER — Telehealth: Payer: Self-pay | Admitting: Internal Medicine

## 2015-02-10 VITALS — BP 106/68 | HR 48 | Ht 70.0 in | Wt 293.5 lb

## 2015-02-10 DIAGNOSIS — R0602 Shortness of breath: Secondary | ICD-10-CM

## 2015-02-10 DIAGNOSIS — J189 Pneumonia, unspecified organism: Secondary | ICD-10-CM | POA: Diagnosis not present

## 2015-02-10 IMAGING — CR DG CHEST 2V
2 series · 2 of 2 positions shown · non-contrast
Comparison: PA and lateral chest x-ray [DATE]

CLINICAL DATA: One 2 day history of shortness of breath, productive
history of CHF.

EXAM:
CHEST  2 VIEW

[w chest pa]
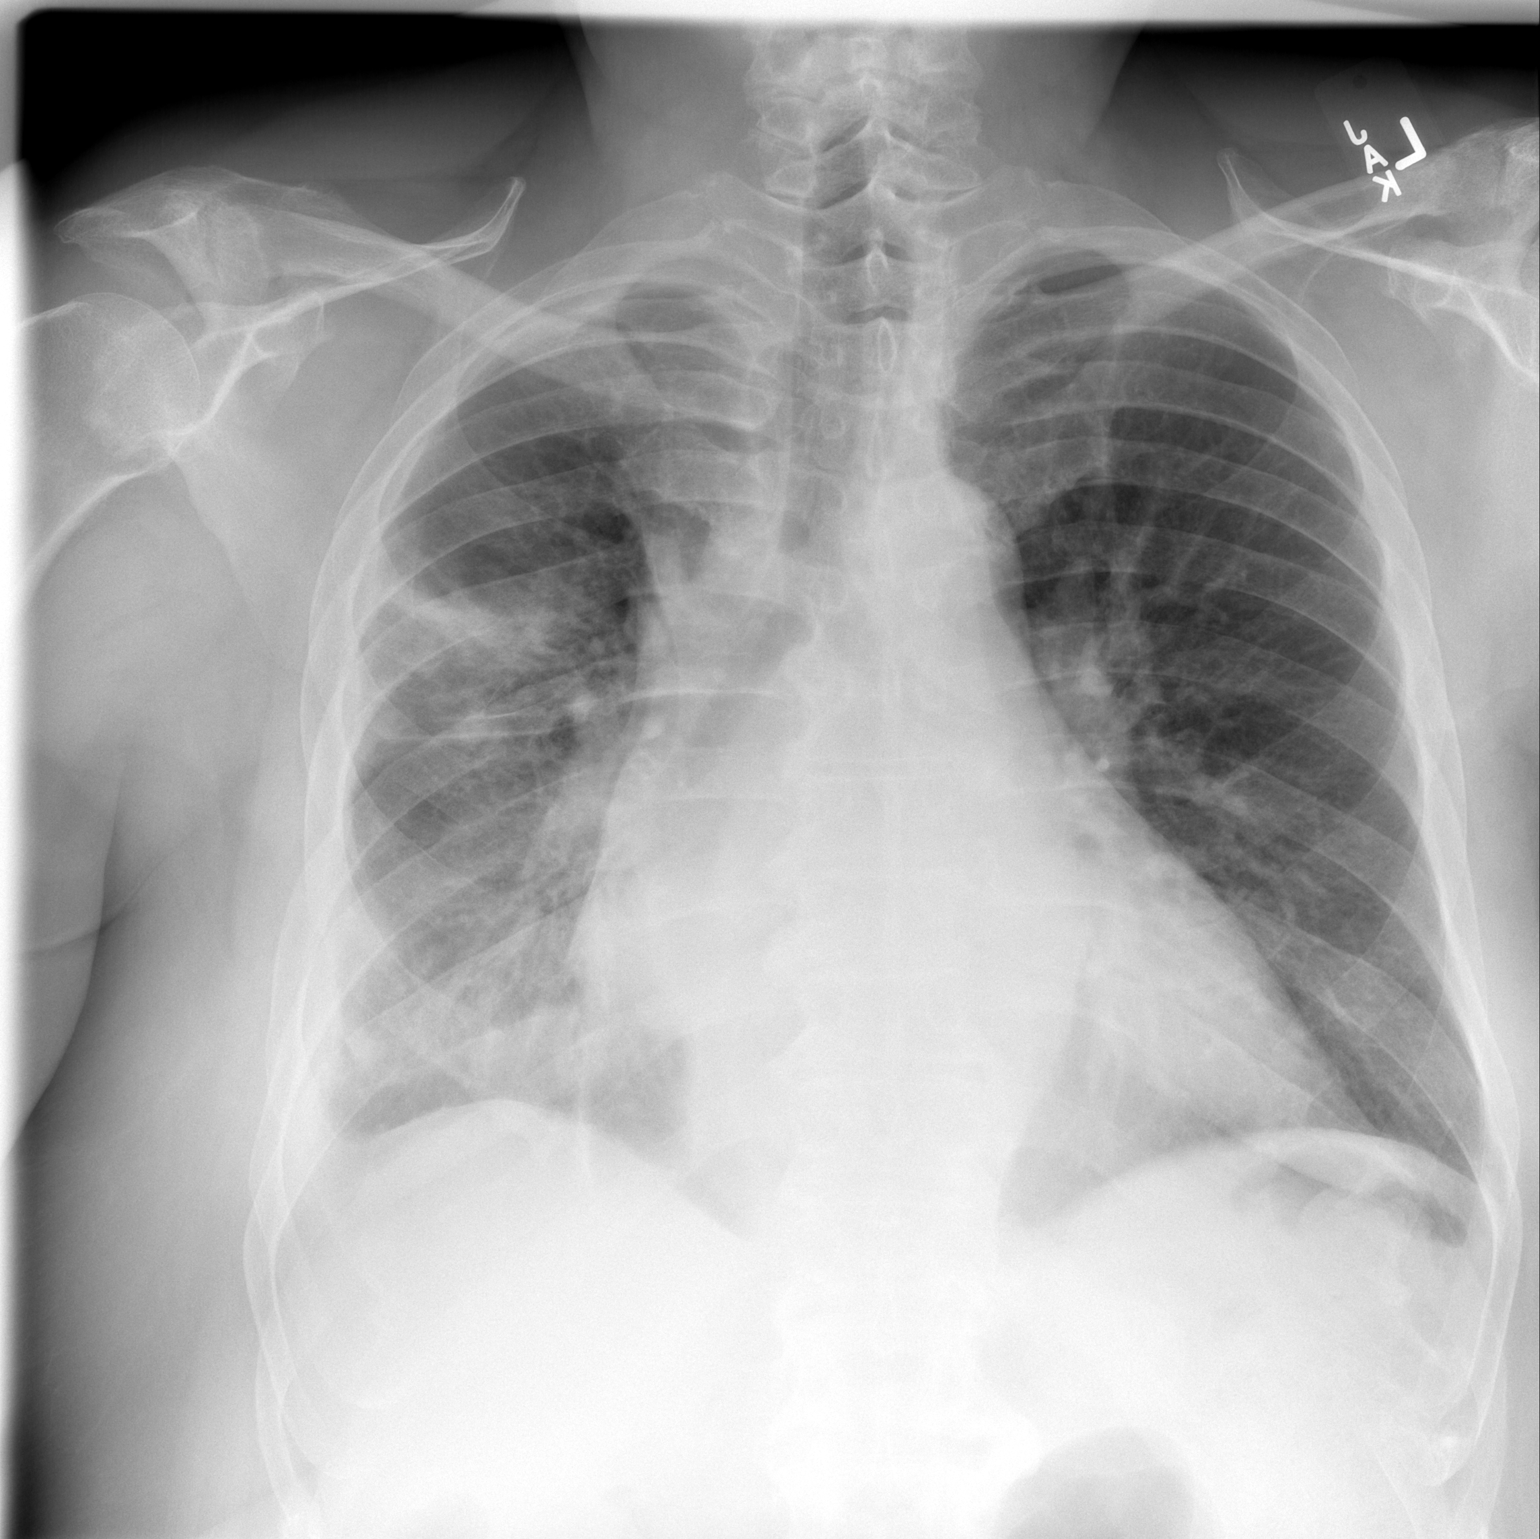

[w chest lat]
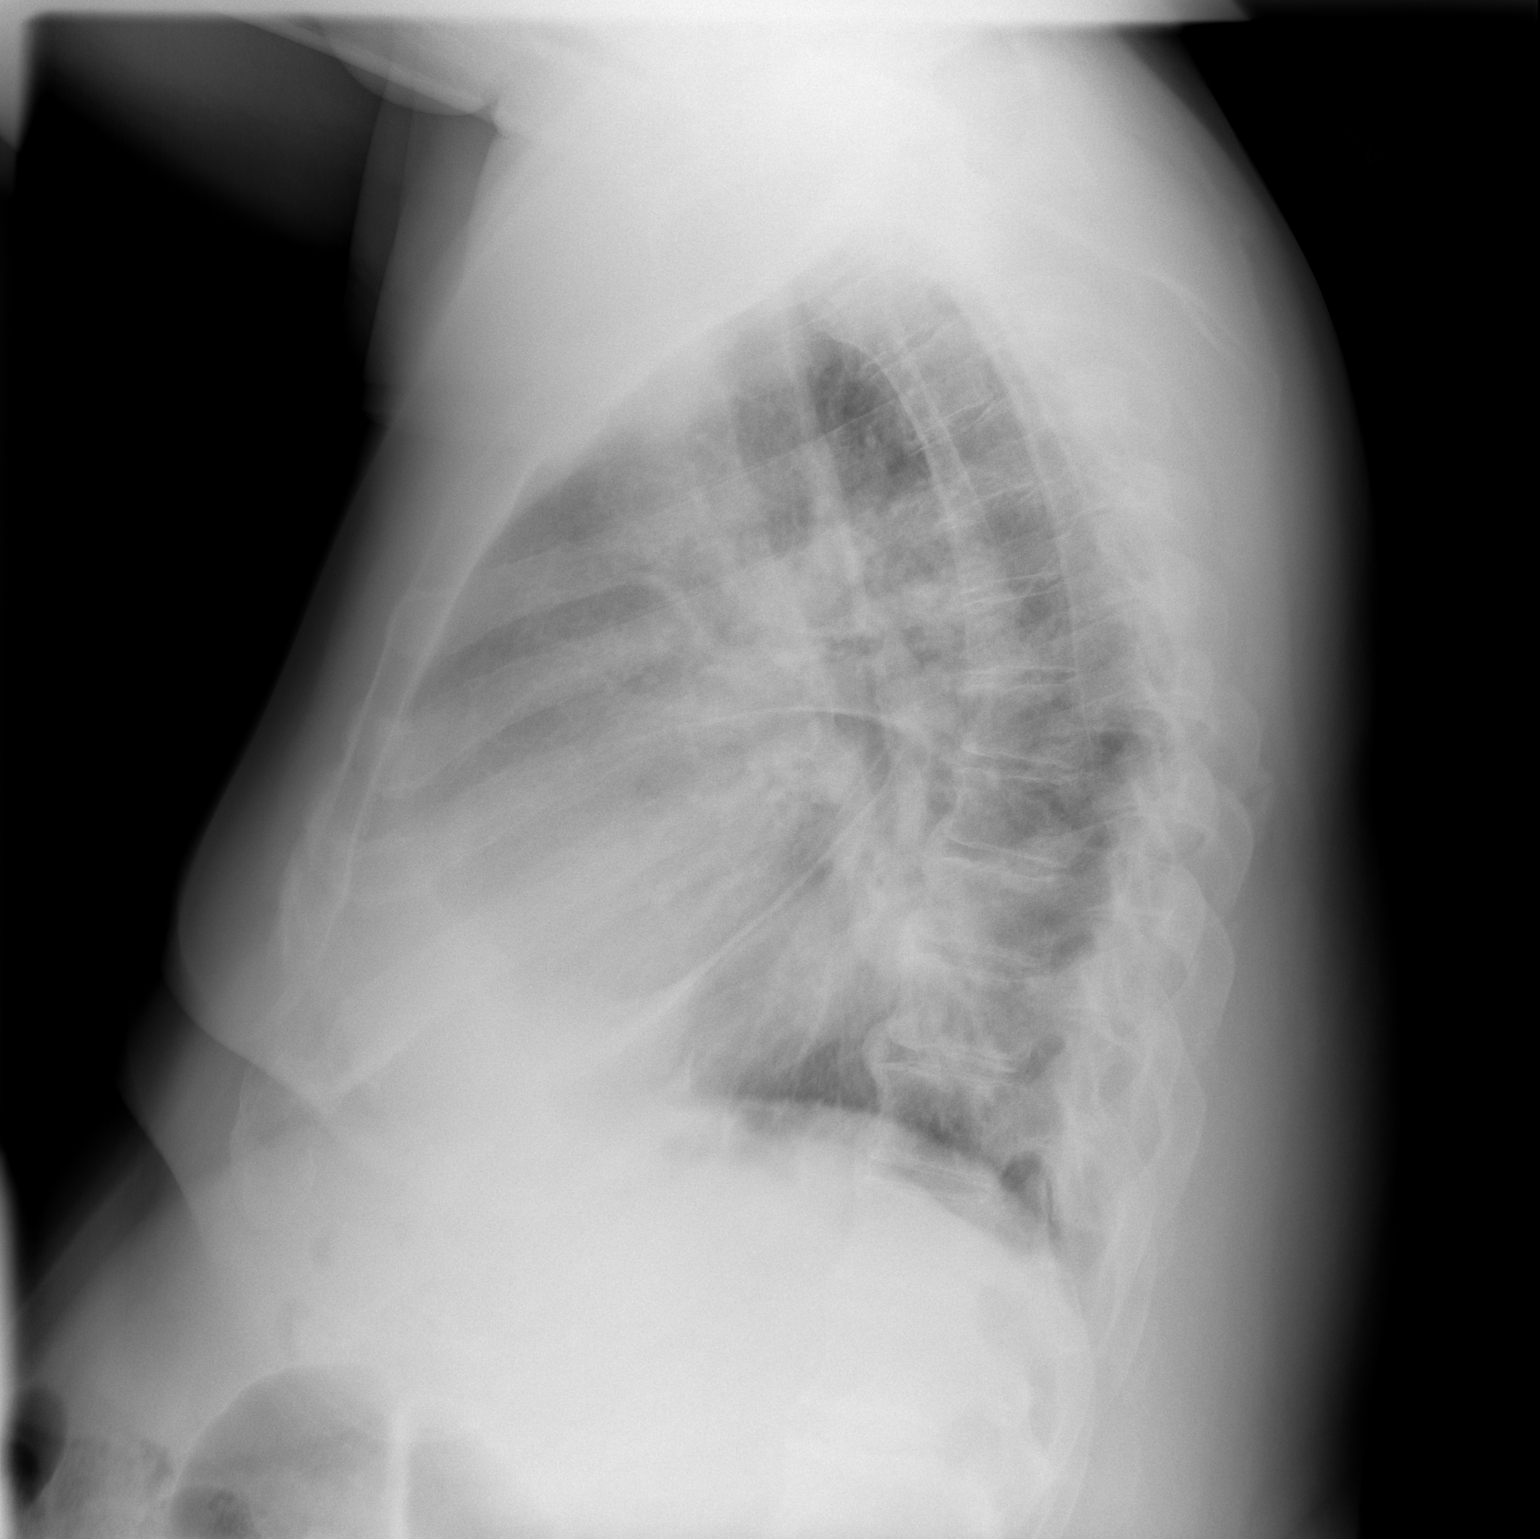

[2 of 2 positions shown; findings below may reference images not displayed]

FINDINGS: The lungs are well-expanded. There is increased parenchymal density
in the right upper lobe. There is stable pleural thickening a light
along the right lower lateral pleural surface. The left lung is
clear. The cardiac silhouette is enlarged. There is chronic hilar
prominence on the right. There is tortuosity of the descending
thoracic aorta. There is mild multilevel degenerative disc disease
of the thoracic spine.
IMPRESSION: 1. Patchy interstitial and alveolar opacity in the right upper lobe
suspicious for pneumonia. There are chronic pleural and parenchymal
changes at the right lung base. Superimposed interstitial pneumonia
here is not entirely excluded.
2. Low-grade compensated CHF.

## 2015-02-10 MED ORDER — DOXYCYCLINE HYCLATE 100 MG PO TABS: 100.0000 mg | ORAL_TABLET | Freq: Two times a day (BID) | ORAL | Status: DC

## 2015-02-10 MED ORDER — METOPROLOL TARTRATE 25 MG PO TABS: 50.0000 mg | ORAL_TABLET | Freq: Two times a day (BID) | ORAL | Status: DC

## 2015-02-10 NOTE — Assessment & Plan Note (Signed)
Chest x-ray reveals patchy interstitial and alveolar opacity in the right upper lobe which is suspicious for pneumonia.  This coincides with the patient's development of shortness of breath as of today.   I prescribed doxycycline 100 mg twice daily for the next 10 days.   He will have his INR checked this coming Friday.

## 2015-02-10 NOTE — Progress Notes (Addendum)
Patient ID: Steven Barnett, male   DOB: 12-08-1950, 64 y.o.   MRN: [id number]    Date:  02/10/2015   ID:  Steven Barnett, DOB [birth year], MRN [id number]  PCP:  Elisabeth Cara PA-C  Primary Cardiologist:  [facility]  Chief Complaint  Patient presents with  . Follow-up    having issues with sleep; no chest pain, frequently shortness of breath, no edema, no pain in legs, occassional cramping in legs, no lightheadedness, no dizziness     History of Present Illness: Steven Barnett is a 64 y.o. morbidly obese African American male with a history significant for hypertension, sleep disorder consistent with obstructive sleep apnea, recent diagnosis of diastolic heart failure with moderate LVH and recent diagnosis of postoperative atrial fibrillation (continues to be in atrial fibrillation) presenting with atypical chest pain. The patient was hospitalized in May 2010 and diagnosed with a right-sided empyema, for which he underwent a thoracotomy. He was found to be in atrial fibrillation postoperatively and has been in this rhythm ever since. At that time, he was also diagnosed with diastolic heart failure and found to have moderate left ventricular hypertrophy by echocardiogram.   Unfortunately, he was incarcerated for the past several years and has not been followed by cardiologist. His warfarin has been continued while in jail however was not adjusted regularly. He recently saw a new primary care provider with [facility] regional physicians and he is here to reestablish cardiac care and followup of his warfarin. He remains in atrial fibrillation and in June when he saw Dr. [name] his heart rate was 72. He reported shortness of breath at that appointment as well. He has a history of sleep apnea but was never treated for this. He reports poor sleep at night and daytime fatigue and sometimes feels like he doesn't want to fall asleep because he feels like he is going to stop breathing. There is witnessed apnea.   he  presents today stating that he's felt short of breath since this morning.  And generalized fatigue He also is not getting very good sleep.  He has some lower extremity edema however, it's improved  Compared to previously. His weight is down 5 pounds since last office visit  In June.   He denies any nausea, vomiting, fever, cough , chest tightness or pain as well as abdominal pain. He is having some acid reflux and takes some baking soda for that which seems to help.   Also denies hematochezia,  Melanoma.   He  Is supposed to be getting his colonoscopy scheduled.    Wt Readings from Last 3 Encounters:  02/10/15 293 lb 8 oz (133.131 kg)  12/16/14 298 lb 3.2 oz (135.263 kg)  08/12/14 307 lb 12.8 oz (139.617 kg)     Past Medical History  Diagnosis Date  . Hypertension   . Atrial fibrillation     Current Outpatient Prescriptions  Medication Sig Dispense Refill  . acetaminophen (TYLENOL) 650 MG CR tablet Take 1,300-2,600 mg by mouth 2 (two) times daily.    Marland Kitchen aspirin 81 MG tablet Take 81 mg by mouth daily.    Marland Kitchen Dextromethorphan-Guaifenesin (MUCINEX DM MAXIMUM STRENGTH) 60-1200 MG TB12 Take 1 tablet by mouth every 12 (twelve) hours as needed.    . diltiazem (CARDIZEM) 120 MG tablet Take 1 tablet (120 mg total) by mouth daily. 90 tablet 2  . DiphenhydrAMINE HCl, Sleep, 25 MG CAPS Take by mouth at bedtime as needed.    . furosemide (LASIX) 40 MG tablet TAKE  ONE TABLET BY MOUTH TWICE DAILY 30 tablet 7  . hydrALAZINE (APRESOLINE) 50 MG tablet Take 1 tablet (50 mg total) by mouth 2 (two) times daily. 60 tablet 9  . ipratropium (ATROVENT HFA) 17 MCG/ACT inhaler Inhale 2 puffs into the lungs as needed for wheezing.    Marland Kitchen lisinopril (PRINIVIL,ZESTRIL) 20 MG tablet Take 1 tablet (20 mg total) by mouth daily. 30 tablet 6  . metoprolol (LOPRESSOR) 25 MG tablet Take 2 tablets (50 mg total) by mouth 2 (two) times daily. 60 tablet 5  . sildenafil (REVATIO) 20 MG tablet Take 2-3 tablets by mouth daily as needed  for sexual activity. 30 tablet 1  . warfarin (COUMADIN) 2 MG tablet TAKE ONE TABLET BY MOUTH IN ADDITION TO THE 4 MG TABLETS AS DIRECTED BY COUMADIN CLINIC (NO REFILLS UNTIL INR CHECKED) 30 tablet 0  . warfarin (COUMADIN) 4 MG tablet TAKE ONE TO TWO TABLETS BY MOUTH ONCE DAILY AS DIRECTED BY COUMADIN CLINIC 40 tablet 5  . doxycycline (VIBRA-TABS) 100 MG tablet Take 1 tablet (100 mg total) by mouth 2 (two) times daily. 20 tablet 0   No current facility-administered medications for this visit.    Allergies:   No Known Allergies  Social History:  The patient  reports that he quit smoking about 40 years ago. He has never used smokeless tobacco. He reports that he does not drink alcohol or use illicit drugs.   Family history:   Family History  Problem Relation Age of Onset  . Hypertension Mother   . Diabetes Mother   . Hyperlipidemia Mother   . Heart disease Father     ROS:  Please see the history of present illness.  All other systems reviewed and negative.   PHYSICAL EXAM: VS:  BP 106/68 mmHg  Pulse 48  Ht 5\' 10"  (1.778 m)  Wt 293 lb 8 oz (133.131 kg)  BMI 42.11 kg/m2  obese, well developed, in no acute distress HEENT: Pupils are equal round react to light accommodation extraocular movements are intact.   Conjunctiva are nice and pink. Neck: no JVDNo cervical lymphadenopathy. Cardiac:  Irregular rate and rhythm without murmurs rubs or gallops. rate is slow. Lungs:  clear to auscultation bilaterally, no wheezing, rhonchi or rales Abd: soft, nontender, positive bowel sounds all quadrants, no hepatosplenomegaly Ext:  1+ lower extremity edema.  2+ radial and dorsalis pedis pulses.   Good capillary refill. Skin: warm and dry Neuro:  Grossly normal  EKG:   Atrial fibrillation with a rate of 53 bpm    ASSESSMENT AND PLAN:   dyspnea  This is likely multifactorial related to obesity hypoventilation syndrome, chronic diastolic heart failure, deconditioning,  Slow ventricular rate.  We'll check a CBC to check his hemoglobin level. Also check a BNP and chest x-ray. He also needs a stress test.    atrial fibrillation  His rate is about 20 beats per minutes lower than it has been on previous EKGs. I'm going to decrease his metoprolol the 25 mg twice a day from 50.Marland Kitchen   Obstructive sleep apnea  He has an order in his chart  for a sleep study it's been there since October of last year.  we will get this scheduled.   This certainly could be contributing to his poor sleep. He states he cannot sit down and watch a movie with his grandkids without falling asleep.    He likely needs to be set up with a CPAP.   Poor sleep  I recommended  melatonin 3 mg before bed.   Obesity     Chronic diastolic heart failure  Despite approximately 1+ lower extremity edema is lungs sound relatively clear and his weight is down 5 pounds from previously. He is sleeping on 3-4 pillows nightly we'll check a BNP and chest x-ray.    Essential hypertension  blood pressures well-controlled  It may be a touch on the low side. I am decreasing his Lopressor  25 mg twice daily to see if that improves his heart rate.     Patient will follow-up in 2 weeks.   Addendum  Problem List Items Addressed This Visit    Community acquired pneumonia     Chest x-ray reveals patchy interstitial and alveolar opacity in the right upper lobe which is suspicious for pneumonia.  This coincides with the patient's development of shortness of breath as of today.   I prescribed doxycycline 100 mg twice daily for the next 10 days.   He will have his INR checked this coming Friday.      Relevant Medications   doxycycline (VIBRA-TABS) 100 MG tablet    Other Visit Diagnoses    Shortness of breath    -  Primary    Relevant Orders    Brain natriuretic peptide    CBC    DG Chest 2 View (Completed)    Myocardial Perfusion Imaging

## 2015-02-10 NOTE — Addendum Note (Signed)
Addended by: Brett Canales on: 02/10/2015 04:39 PM   Modules accepted: Orders

## 2015-02-10 NOTE — Telephone Encounter (Signed)
Mr.Allan is calling to find out if he can have a EKG and stress test . He is also experiencing  some shortness of breath . Please Call    Thanks

## 2015-02-10 NOTE — Patient Instructions (Addendum)
Medication Instructions:  DECREASE Metoprolol to 25 mg. You make take half of a tablet by mouth twice daily until your bottle runs out. A new prescription has been sent to your pharmacy electronically with NEW INSTRUCTIONS. TAKE Melatonin 3 mg - take 1 tablet by mouth as needed for sleep. This is an over-the-counter medication.  Labwork: Your physician recommends that you return for lab work TODAY.  Testing/Procedures: Tarri Fuller, PA-C, recommends that you have a chest xray. This has been ordered at [place] in the [facility] building at [place], [address].  Your physician has requested that you have a lexiscan myoview. For further information please visit [email or web address]. Please follow instruction sheet, as given.  Follow-Up: Your physician recommends that you schedule a follow-up appointment in 2 weeks with either Dr [name] or Tarri Fuller, PA-C.   **PLEASE SCHEDULE SLEEP STUDY**

## 2015-02-10 NOTE — Telephone Encounter (Signed)
Returned call to patient he stated he has been sob for the last 2 days.Stated hard to breathe when he lies down at night.Weight stable.No edema noticed.No chest pain.Patient sounded sob when talking.Appointment scheduled with Tarri Fuller PA today at 1:30 pm.

## 2015-02-10 NOTE — Telephone Encounter (Signed)
Received a call from [place] with [facility] calling to report cxr done today revealed pneumonia.Tarri Fuller PA was made aware.

## 2015-02-10 NOTE — Telephone Encounter (Signed)
Almyra Free is calling in to report a chest x-ray

## 2015-02-11 ENCOUNTER — Telehealth: Payer: Self-pay | Admitting: Internal Medicine

## 2015-02-11 LAB — CBC
HCT: 37.1 % — ABNORMAL LOW (ref 39.0–52.0)
Hemoglobin: 11 g/dL — ABNORMAL LOW (ref 13.0–17.0)
MCH: 21 pg — AB (ref 26.0–34.0)
MCHC: 29.6 g/dL — ABNORMAL LOW (ref 30.0–36.0)
MCV: 70.9 fL — AB (ref 78.0–100.0)
MPV: 9.9 fL (ref 8.6–12.4)
Platelets: 256 10*3/uL (ref 150–400)
RBC: 5.23 MIL/uL (ref 4.22–5.81)
RDW: 19.9 % — ABNORMAL HIGH (ref 11.5–15.5)
WBC: 9.7 10*3/uL (ref 4.0–10.5)

## 2015-02-11 LAB — BRAIN NATRIURETIC PEPTIDE: BRAIN NATRIURETIC PEPTIDE: 330.3 pg/mL — AB (ref 0.0–100.0)

## 2015-02-11 NOTE — Telephone Encounter (Signed)
Steven Barnett id returning the call about her father Steven Barnett. Please call . Thanks

## 2015-02-11 NOTE — Telephone Encounter (Signed)
Patient said he needs a copy of EKG for insurance purposes.  Instructed that he can pick up a copy from medical records

## 2015-02-11 NOTE — Telephone Encounter (Signed)
Returning your call. °

## 2015-02-11 NOTE — Telephone Encounter (Signed)
Daughter called inquring about EKG results.  Has not had one since June.  She thought MD was waiting to look over the results of that.  I told her that I would call her back if an EKG is scanned in with results or if physician wanted one  Will route to Dr. [name] to clarify if he also wanted an EKG yesterday since his daughter thought he did

## 2015-02-13 ENCOUNTER — Telehealth (HOSPITAL_COMMUNITY): Payer: Self-pay

## 2015-02-13 ENCOUNTER — Ambulatory Visit: Payer: Medicaid Other | Admitting: Pharmacist Clinician (PhC)/ Clinical Pharmacy Specialist

## 2015-02-13 NOTE — Telephone Encounter (Signed)
Encounter complete. 

## 2015-02-18 ENCOUNTER — Inpatient Hospital Stay (HOSPITAL_COMMUNITY): Admission: RE | Admit: 2015-02-18 | Payer: Medicaid Other | Source: Ambulatory Visit

## 2015-02-19 NOTE — Addendum Note (Signed)
Addended by: Diana Eves on: 02/19/2015 04:32 PM   Modules accepted: Orders

## 2015-02-20 ENCOUNTER — Telehealth (HOSPITAL_COMMUNITY): Payer: Self-pay

## 2015-02-20 NOTE — Telephone Encounter (Signed)
Encounter complete. 

## 2015-02-24 ENCOUNTER — Other Ambulatory Visit: Payer: Self-pay | Admitting: Internal Medicine

## 2015-02-25 ENCOUNTER — Ambulatory Visit (HOSPITAL_COMMUNITY)
Admission: RE | Admit: 2015-02-25 | Discharge: 2015-02-25 | Disposition: A | Discharge status: Home / Self Care | Payer: Medicaid Other | Source: Ambulatory Visit | Attending: Cardiology | Admitting: Cardiology

## 2015-02-25 DIAGNOSIS — I4891 Unspecified atrial fibrillation: Secondary | ICD-10-CM | POA: Insufficient documentation

## 2015-02-25 DIAGNOSIS — Z8249 Family history of ischemic heart disease and other diseases of the circulatory system: Secondary | ICD-10-CM | POA: Diagnosis not present

## 2015-02-25 DIAGNOSIS — G4733 Obstructive sleep apnea (adult) (pediatric): Secondary | ICD-10-CM | POA: Insufficient documentation

## 2015-02-25 DIAGNOSIS — E669 Obesity, unspecified: Secondary | ICD-10-CM | POA: Diagnosis not present

## 2015-02-25 DIAGNOSIS — I10 Essential (primary) hypertension: Secondary | ICD-10-CM | POA: Insufficient documentation

## 2015-02-25 DIAGNOSIS — R0602 Shortness of breath: Secondary | ICD-10-CM

## 2015-02-25 DIAGNOSIS — Z87891 Personal history of nicotine dependence: Secondary | ICD-10-CM | POA: Diagnosis not present

## 2015-02-25 DIAGNOSIS — Z6841 Body Mass Index (BMI) 40.0 and over, adult: Secondary | ICD-10-CM | POA: Diagnosis not present

## 2015-02-25 IMAGING — NM NM MISC PROCEDURE
4 series · 24 of 24 positions shown · non-contrast
Comparison: none

[Series 1: wbr stress ng · 6.40mm/px · 6 of 63 frames shown]
[frame 6/63]
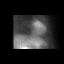
[frame 16/63]
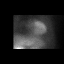
[frame 27/63]
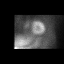
[frame 37/63]
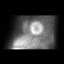
[frame 48/63]
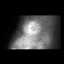
[frame 58/63]
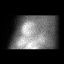

[Series 1: wbr_s-proj_st wbr stress ng · 6.40mm/px · 6 of 64 frames shown]
[frame 6/64]
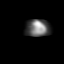
[frame 16/64]
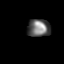
[frame 27/64]
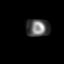
[frame 38/64]
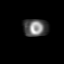
[frame 48/64]
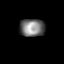
[frame 59/64]
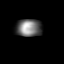

[Series 2: wbr_r-proj_st wbr rest · 6.40mm/px · 6 of 64 frames shown]
[frame 6/64]
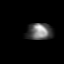
[frame 16/64]
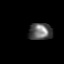
[frame 27/64]
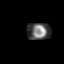
[frame 38/64]
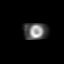
[frame 48/64]
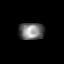
[frame 59/64]
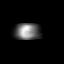

[Series 2: wbr rest · 6.40mm/px · 6 of 62 frames shown]
[frame 6/62]
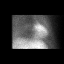
[frame 16/62]
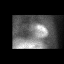
[frame 26/62]
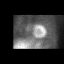
[frame 37/62]
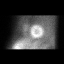
[frame 47/62]
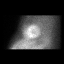
[frame 57/62]
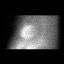

[24 of 24 positions shown; findings below may reference images not displayed]

Canned report from images found in remote index.

Refer to host system for actual result text.

## 2015-02-25 MED ORDER — REGADENOSON 0.4 MG/5ML IV SOLN
0.4000 mg | Freq: Once | INTRAVENOUS | Status: AC
  Administered 2015-02-25: 0.4 mg via INTRAVENOUS

## 2015-02-25 MED ORDER — TECHNETIUM TC 99M SESTAMIBI GENERIC - CARDIOLITE
32.1000 | Freq: Once | INTRAVENOUS | Status: AC | PRN
  Administered 2015-02-25: 32.1 via INTRAVENOUS

## 2015-02-26 ENCOUNTER — Ambulatory Visit (HOSPITAL_COMMUNITY)
Admission: RE | Admit: 2015-02-26 | Discharge: 2015-02-26 | Disposition: A | Discharge status: Home / Self Care | Payer: Medicaid Other | Source: Ambulatory Visit | Attending: Cardiology | Admitting: Cardiology

## 2015-02-26 LAB — MYOCARDIAL PERFUSION IMAGING
CHL CUP RESTING HR STRESS: 71 {beats}/min
CSEPPHR: 73 {beats}/min
SDS: 2
SRS: 1
SSS: 3
TID: 1.04

## 2015-02-26 MED ORDER — TECHNETIUM TC 99M SESTAMIBI GENERIC - CARDIOLITE
32.2000 | Freq: Once | INTRAVENOUS | Status: AC | PRN
  Administered 2015-02-26: 32.2 via INTRAVENOUS

## 2015-02-27 ENCOUNTER — Ambulatory Visit: Payer: Medicaid Other | Admitting: Internal Medicine

## 2015-03-04 ENCOUNTER — Telehealth: Payer: Self-pay | Admitting: *Deleted

## 2015-03-04 NOTE — Telephone Encounter (Signed)
Patient returned call, advised of his normal stress test.  Patient asked to have a copy of the result mailed to him.  Put copy in the mail to him.

## 2015-03-31 ENCOUNTER — Other Ambulatory Visit: Payer: Self-pay | Admitting: Internal Medicine

## 2015-03-31 NOTE — Telephone Encounter (Signed)
Rx request sent to pharmacy.  

## 2015-04-01 ENCOUNTER — Encounter: Payer: Self-pay | Admitting: Internal Medicine

## 2015-04-01 ENCOUNTER — Ambulatory Visit (HOSPITAL_BASED_OUTPATIENT_CLINIC_OR_DEPARTMENT_OTHER): Payer: Medicaid Other | Attending: Internal Medicine | Admitting: Radiology

## 2015-04-01 ENCOUNTER — Ambulatory Visit (INDEPENDENT_AMBULATORY_CARE_PROVIDER_SITE_OTHER): Payer: Medicaid Other | Admitting: Internal Medicine

## 2015-04-01 ENCOUNTER — Ambulatory Visit (INDEPENDENT_AMBULATORY_CARE_PROVIDER_SITE_OTHER): Payer: Medicaid Other | Admitting: Pharmacist Clinician (PhC)/ Clinical Pharmacy Specialist

## 2015-04-01 VITALS — Ht 71.0 in | Wt 296.0 lb

## 2015-04-01 VITALS — BP 148/78 | HR 54 | Ht 70.0 in | Wt 296.1 lb

## 2015-04-01 DIAGNOSIS — R0683 Snoring: Secondary | ICD-10-CM | POA: Insufficient documentation

## 2015-04-01 DIAGNOSIS — I10 Essential (primary) hypertension: Secondary | ICD-10-CM | POA: Diagnosis not present

## 2015-04-01 DIAGNOSIS — I482 Chronic atrial fibrillation: Secondary | ICD-10-CM

## 2015-04-01 DIAGNOSIS — G4719 Other hypersomnia: Secondary | ICD-10-CM | POA: Diagnosis present

## 2015-04-01 DIAGNOSIS — R5383 Other fatigue: Secondary | ICD-10-CM

## 2015-04-01 DIAGNOSIS — Z7901 Long term (current) use of anticoagulants: Secondary | ICD-10-CM | POA: Diagnosis not present

## 2015-04-01 DIAGNOSIS — E669 Obesity, unspecified: Secondary | ICD-10-CM | POA: Diagnosis not present

## 2015-04-01 DIAGNOSIS — I5032 Chronic diastolic (congestive) heart failure: Secondary | ICD-10-CM | POA: Diagnosis not present

## 2015-04-01 DIAGNOSIS — G4731 Primary central sleep apnea: Secondary | ICD-10-CM | POA: Insufficient documentation

## 2015-04-01 DIAGNOSIS — I4821 Permanent atrial fibrillation: Secondary | ICD-10-CM

## 2015-04-01 DIAGNOSIS — G4733 Obstructive sleep apnea (adult) (pediatric): Secondary | ICD-10-CM | POA: Diagnosis not present

## 2015-04-01 DIAGNOSIS — Z6841 Body Mass Index (BMI) 40.0 and over, adult: Secondary | ICD-10-CM | POA: Diagnosis not present

## 2015-04-01 DIAGNOSIS — I4891 Unspecified atrial fibrillation: Secondary | ICD-10-CM

## 2015-04-01 DIAGNOSIS — G4736 Sleep related hypoventilation in conditions classified elsewhere: Secondary | ICD-10-CM | POA: Insufficient documentation

## 2015-04-01 LAB — POCT INR: INR: 1.9

## 2015-04-01 NOTE — Progress Notes (Signed)
Marland Kitchen    OFFICE NOTE   Chief Complaint:  Routine follow-up  Primary Care Physician: Elisabeth Cara, PA-C  HPI:  Steven Barnett is a 64 year old morbidly obese African American male with a history significant for hypertension, sleep disorder consistent with obstructive sleep apnea, recent diagnosis of diastolic heart failure with moderate LVH and recent diagnosis of postoperative atrial fibrillation (continues to be in atrial fibrillation) presenting with atypical chest pain. The patient was hospitalized in May 2010 and diagnosed with a right-sided empyema, for which he underwent a thoracotomy. He was found to be in atrial fibrillation postoperatively and has been in this rhythm ever since. At that time, he was also diagnosed with diastolic heart failure and found to have moderate left ventricular hypertrophy by echocardiogram.   Unfortunately, he was incarcerated for the past several years and has not been followed by cardiologist. His warfarin has been continued while in jail however was not adjusted regularly. He recently saw a new primary care provider with [facility] regional physicians and he is here to reestablish cardiac care and followup of his warfarin. He remains in atrial fibrillation today. He reports shortness of breath which is unchanged.  He denies any chest pain. He does report some leg swelling. He has problems with his knees from arthritis and pain. He also has not been able to lose weight. Finally, he has a history of sleep apnea but was never treated for this. He reports poor sleep at night and daytime fatigue and sometimes feels like he doesn't want to fall asleep because he feels like he is going to stop breathing. There is witnessed apnea.  I saw Steven Barnett in the office today. His main complaints are with erectile dysfunction. He is interested in Viagra. Denies any chest pain and reports good sleep. He is therapeutic on warfarin and is in permanent atrial fibrillation.  Steven Barnett  returns to the office today. He is inquiring about getting generic sildenafil as Viagra was not covered. He also wants to be referred for colonoscopy. I told him that would be up to his primary care provider but he said "I thought you were my primary care provider". I clarified the differences in of encouraged him to follow-up with his primary care provider with [facility] physicians. We will go ahead and refer him for screening colonoscopy. He was advised that he need to hold warfarin for least 5 days prior to colonoscopy.  I saw Steven Barnett back in the office today. He was seen this summer by Tarri Fuller, PA-C, for evaluation of cough and shortness of breath. A chest x-ray demonstrated possible pneumonia and was treated with doxycycline. He's not sure if this actually improved his symptoms or not. He recently has been absent for monthly warfarin checks. He says that he was not notified by the office however monthly appointments are scheduled and call also been made. He would like to arrange for a set day for him to come and get his warfarin checked.  PMHx:  Past Medical History  Diagnosis Date  . Hypertension   . Atrial fibrillation     No past surgical history on file.  FAMHx:  Family History  Problem Relation Age of Onset  . Hypertension Mother   . Diabetes Mother   . Hyperlipidemia Mother   . Heart disease Father     SOCHx:   reports that he quit smoking about 40 years ago. He has never used smokeless tobacco. He reports that he does not drink alcohol or  use illicit drugs.  ALLERGIES:  No Known Allergies  ROS: A comprehensive review of systems was negative except for: Constitutional: positive for fatigue Respiratory: positive for dyspnea on exertion Cardiovascular: positive for irregular heart beat and lower extremity edema Genitourinary: positive for sexual problems Musculoskeletal: positive for knee pain  HOME MEDS: Current Outpatient Prescriptions  Medication Sig Dispense Refill    . acetaminophen (TYLENOL) 650 MG CR tablet Take 1,300-2,600 mg by mouth 2 (two) times daily.    Marland Kitchen aspirin 81 MG tablet Take 81 mg by mouth daily.    Marland Kitchen Dextromethorphan-Guaifenesin (MUCINEX DM MAXIMUM STRENGTH) 60-1200 MG TB12 Take 1 tablet by mouth every 12 (twelve) hours as needed.    . diltiazem (CARDIZEM) 120 MG tablet TAKE ONE TABLET BY MOUTH ONCE DAILY 90 tablet 0  . doxycycline (VIBRA-TABS) 100 MG tablet Take 1 tablet (100 mg total) by mouth 2 (two) times daily. 20 tablet 0  . furosemide (LASIX) 40 MG tablet TAKE ONE TABLET BY MOUTH TWICE DAILY 30 tablet 7  . hydrALAZINE (APRESOLINE) 50 MG tablet Take 1 tablet (50 mg total) by mouth 2 (two) times daily. 60 tablet 9  . ipratropium (ATROVENT HFA) 17 MCG/ACT inhaler Inhale 2 puffs into the lungs as needed for wheezing.    Marland Kitchen lisinopril (PRINIVIL,ZESTRIL) 20 MG tablet Take 1 tablet (20 mg total) by mouth daily. 30 tablet 6  . metoprolol (LOPRESSOR) 25 MG tablet Take 2 tablets (50 mg total) by mouth 2 (two) times daily. 60 tablet 5  . sildenafil (REVATIO) 20 MG tablet TAKE 2 TO 3 TABLETS BY MOUTH DAILY AS NEEDED FOR SEXUAL ACTIVITY 30 tablet 0  . warfarin (COUMADIN) 2 MG tablet TAKE ONE TABLET BY MOUTH IN  ADDITION  TO  THE  4  MG  TABLETS  AS  DIRECTED  BY  COUMADIN  CLINIC  (NO  REFILLS  UNTIL  INR  CHECKED) 30 tablet 1  . warfarin (COUMADIN) 4 MG tablet TAKE ONE TO TWO TABLETS BY MOUTH ONCE DAILY AS DIRECTED BY COUMADIN CLINIC 40 tablet 5   No current facility-administered medications for this visit.    LABS/IMAGING: Results for orders placed or performed in visit on 04/01/15 (from the past 48 hour(s))  POCT INR     Status: None   Collection Time: 04/01/15  3:37 PM  Result Value Ref Range   INR 1.9    No results found.  VITALS: BP 148/78 mmHg  Pulse 54  Ht 5\' 10"  (1.778 m)  Wt 296 lb 1.6 oz (134.31 kg)  BMI 42.49 kg/m2  EXAM: General appearance: alert, no distress and morbidly obese Neck: no carotid bruit and no JVD Lungs:  clear to auscultation bilaterally Heart: regular rate and rhythm, S1, S2 normal, no murmur, click, rub or gallop Abdomen: soft, non-tender; bowel sounds normal; no masses,  no organomegaly Extremities: edema 1+ bilateral pitting edema Pulses: 2+ and symmetric Skin: Skin color, texture, turgor normal. No rashes or lesions Neurologic: Grossly normal Psych: Pleasant  EKG: Deferred  ASSESSMENT: 1. Permanent atrial fibrillation 2. Morbid obesity 3. Obstructive sleep apnea-not on CPAP 4. Hypertension controlled 5. Leg edema 6. Bilateral knee osteoarthritis 7. Erectile dysfunction  PLAN: 1.   Steven Barnett has been noncompliant with follow-up INR checks, however he seems to be taking his warfarin. INR today was 1.9. He said that were not contacting him or calling him to remind him however that has been our policy. We'll go ahead and schedule a set day every month which is  the last Wednesday of the month. I talked with Erasmo Downer and she said this would be an okay plan. Hopefully we can get a more compliant with INR checks. He understands the danger of waiting longer to have the INR checked including elevated bleeding risk with supratherapeutic INR or perhaps increased risk for stroke if the INR is subtherapeutic. He is scheduled to have the second night of a sleep apnea study tonight and hopefully he will be treated. Blood pressure is reasonable today.  Plan to see him back in 6 months.  Pixie Casino, MD, [facility] Attending Cardiologist [place] 04/01/2015, 4:56 PM

## 2015-04-01 NOTE — Patient Instructions (Signed)
Your physician wants you to follow-up in: 6 months with Dr. Hilty. You will receive a reminder letter in the mail two months in advance. If you don't receive a letter, please call our office to schedule the follow-up appointment.    

## 2015-04-14 NOTE — Sleep Study (Signed)
Patient Name: Steven Barnett, Steven Barnett Date: 04/01/2015 Gender: Male D.O.B: 26-Jan-1951 Age (years): 64 Referring Provider: [name] Hilty Height (inches): 71 Interpreting Physician: Shelva Majestic MD, ABSM Weight (lbs): 296 RPSGT: Carolin Coy BMI: 41 MRN: [id number] Neck Size: 17.00 CLINICAL INFORMATION Sleep Study Type: NPSG  Indication for sleep study: Excessive Daytime Sleepiness, Fatigue, Hypertension, Obesity, Snoring, Witnessed Apneas  Epworth Sleepiness Score: N/A  SLEEP STUDY TECHNIQUE As per the AASM Manual for the Scoring of Sleep and Associated Events v2.3 (April 2016) with a hypopnea requiring 4% desaturations.  The channels recorded and monitored were frontal, central and occipital EEG, electrooculogram (EOG), submentalis EMG (chin), nasal and oral airflow, thoracic and abdominal wall motion, anterior tibialis EMG, snore microphone, electrocardiogram, and pulse oximetry.  MEDICATIONS  acetaminophen (TYLENOL) 650 MG CR tablet 1,300-2,600 mg, 2 times daily     aspirin 81 MG tablet 81 mg, Daily     Dextromethorphan-Guaifenesin (MUCINEX DM MAXIMUM STRENGTH) 60-1200 MG TB12 1 tablet, Every 12 hours PRN     diltiazem (CARDIZEM) 120 MG tablet      doxycycline (VIBRA-TABS) 100 MG tablet 100 mg, 2 times daily     furosemide (LASIX) 40 MG tablet      hydrALAZINE (APRESOLINE) 50 MG tablet 50 mg, 2 times daily     ipratropium (ATROVENT HFA) 17 MCG/ACT inhaler 2 puff, As needed     lisinopril (PRINIVIL,ZESTRIL) 20 MG tablet 20 mg, Daily     metoprolol (LOPRESSOR) 25 MG tablet 50 mg, 2 times daily     sildenafil (REVATIO) 20 MG tablet      warfarin (COUMADIN) 2 MG tablet      warfarin (COUMADIN) 4 MG tablet   Medications self-administered by patient during sleep study : No sleep medicine administered.  SLEEP ARCHITECTURE The study was initiated at 9:23:40 PM and ended at 3:26:31 AM.  Sleep onset time was 68.7 minutes and the sleep efficiency was 19.3%. The total sleep  time was 70.0 minutes. Wake after sleep onset (WASO): 224.1 minutes.  Stage REM latency was N/A minutes.  The patient spent 60.71% of the night in stage N1 sleep, 39.29% in stage N2 sleep, 0.00% in stage N3 and 0.00% in REM.  Alpha intrusion was absent.  Supine sleep was 100.00%.  RESPIRATORY PARAMETERS The overall apnea/hypopnea index (AHI) was 83.1 per hour. There were 47 total apneas, including 18 obstructive, 27 central and 2 mixed apneas. There were 50 hypopneas and 23 RERAs.  The AHI during Stage REM sleep was N/A per hour.  AHI while supine was 83.1 per hour.  The mean oxygen saturation was 92.33%. The minimum SpO2 during sleep was 85.00%.  Soft snoring was noted during this study.  CARDIAC DATA The 2 lead EKG demonstrated atrial fibrillation. The mean heart rate was 76.46 beats per minute. Other EKG findings include: None.  LEG MOVEMENT DATA The total PLMS were 1 with a resulting PLMS index of 0.86. Associated arousal with leg movement index was 0.9 .  IMPRESSIONS Severe obstructive sleep apnea /hypopnea syndrome (AHI = 83.1/h). Moderate central sleep apnea occurred during this study (CAI = 23.1/h). Reduced sleep efficiency with increased frequency of awakening. Abnormal sleep architecture with absence of slow wave and REM sleep.  Significant oxygen desaturation to a nadir of 85.00% during non-REM sleep. Soft snoring volume. No cardiac abnormalities were noted during this study. Clinically significant periodic limb movements did not occur during sleep. No significant associated arousals.  DIAGNOSIS Obstructive Sleep Apnea (327.23 [G47.33 ICD-10]) Central Sleep Apnea (327.21 [  G47.37 ICD-10]) Nocturnal Hypoxemia (327.26 [G47.36 ICD-10])  RECOMMENDATIONS Expeditious CPAP titration to determine optimal pressure required to alleviate sleep disordered breathing. BiPAP or ASV titration may be required to eliminate central sleep apnea. Positional therapy avoiding supine  position during sleep. Avoid alcohol, sedatives and other CNS depressants that may worsen sleep apnea and disrupt normal sleep architecture. Sleep hygiene should be reviewed to assess factors that may improve sleep quality. Weight management and regular exercise in this patient with morbid obesity.   Troy Sine, MD, [place], American Board of Sleep Medicine   ELECTRONICALLY SIGNED ON:  04/14/2015, 5:20 PM [place] PH: [phone]   FX: [phone] [place]

## 2015-04-15 ENCOUNTER — Telehealth: Payer: Self-pay | Admitting: Cardiovascular Disease

## 2015-04-15 ENCOUNTER — Telehealth: Payer: Self-pay | Admitting: *Deleted

## 2015-04-15 NOTE — Telephone Encounter (Signed)
Returned a call to patient. Left message to call back again to discuss sleep study results.

## 2015-04-15 NOTE — Telephone Encounter (Signed)
Left message to return a call. 

## 2015-04-15 NOTE — Telephone Encounter (Signed)
Returning your call. °

## 2015-04-15 NOTE — Telephone Encounter (Signed)
-----   Message from Troy Sine, MD sent at 04/14/2015  5:36 PM EDT ----- [name], please expedite CPAP titration study.

## 2015-04-15 NOTE — Progress Notes (Signed)
Left message to return a call to me. 

## 2015-04-16 ENCOUNTER — Telehealth: Payer: Self-pay | Admitting: Cardiovascular Disease

## 2015-04-16 DIAGNOSIS — G4733 Obstructive sleep apnea (adult) (pediatric): Secondary | ICD-10-CM

## 2015-04-16 NOTE — Telephone Encounter (Signed)
Pt calling again.

## 2015-04-16 NOTE — Telephone Encounter (Signed)
Pt called again

## 2015-04-18 ENCOUNTER — Other Ambulatory Visit: Payer: Self-pay | Admitting: Internal Medicine

## 2015-04-21 NOTE — Telephone Encounter (Signed)
-----   Message from Troy Sine, MD sent at 04/14/2015  5:36 PM EDT ----- [name], please expedite CPAP titration study.

## 2015-04-21 NOTE — Telephone Encounter (Signed)
Called patient and discussed sleep study results with him. Patient is reluctant to do titration study. Stressed to him that he has a severe case and he really needs to be treated. Having OSA can cause other health issues and even death. Patient then agreed to the study. ttiration study ordered with note to schedule ASAP.

## 2015-04-21 NOTE — Progress Notes (Signed)
titration study ordered.

## 2015-04-22 ENCOUNTER — Other Ambulatory Visit: Payer: Self-pay | Admitting: Internal Medicine

## 2015-04-22 NOTE — Telephone Encounter (Signed)
Ok to refill? Please advise. Thanks, MI 

## 2015-04-29 ENCOUNTER — Ambulatory Visit: Payer: Medicaid Other | Admitting: Pharmacist Clinician (PhC)/ Clinical Pharmacy Specialist

## 2015-06-03 ENCOUNTER — Ambulatory Visit (INDEPENDENT_AMBULATORY_CARE_PROVIDER_SITE_OTHER): Payer: Medicaid Other | Admitting: Pharmacist Clinician (PhC)/ Clinical Pharmacy Specialist

## 2015-06-03 DIAGNOSIS — Z7901 Long term (current) use of anticoagulants: Secondary | ICD-10-CM | POA: Diagnosis not present

## 2015-06-03 DIAGNOSIS — I4821 Permanent atrial fibrillation: Secondary | ICD-10-CM

## 2015-06-03 DIAGNOSIS — I482 Chronic atrial fibrillation: Secondary | ICD-10-CM

## 2015-06-03 LAB — POCT INR: INR: 1.4

## 2015-06-09 ENCOUNTER — Other Ambulatory Visit: Payer: Self-pay | Admitting: Internal Medicine

## 2015-06-17 ENCOUNTER — Ambulatory Visit (INDEPENDENT_AMBULATORY_CARE_PROVIDER_SITE_OTHER): Payer: Medicaid Other | Admitting: Pharmacist Clinician (PhC)/ Clinical Pharmacy Specialist

## 2015-06-17 DIAGNOSIS — Z7901 Long term (current) use of anticoagulants: Secondary | ICD-10-CM

## 2015-06-17 DIAGNOSIS — I482 Chronic atrial fibrillation: Secondary | ICD-10-CM | POA: Diagnosis not present

## 2015-06-17 DIAGNOSIS — I4821 Permanent atrial fibrillation: Secondary | ICD-10-CM

## 2015-06-17 LAB — POCT INR: INR: 1.6

## 2015-06-26 ENCOUNTER — Other Ambulatory Visit: Payer: Self-pay | Admitting: Internal Medicine

## 2015-07-07 ENCOUNTER — Other Ambulatory Visit: Payer: Self-pay | Admitting: Internal Medicine

## 2015-07-08 ENCOUNTER — Inpatient Hospital Stay (HOSPITAL_COMMUNITY)
Admission: EM | Admit: 2015-07-08 | Discharge: 2015-07-12 | DRG: 292 | Disposition: A | Discharge status: Home Health | Payer: Medicaid Other | Attending: Internal Medicine | Admitting: Internal Medicine

## 2015-07-08 ENCOUNTER — Ambulatory Visit (INDEPENDENT_AMBULATORY_CARE_PROVIDER_SITE_OTHER): Payer: Medicaid Other | Admitting: Pharmacist Clinician (PhC)/ Clinical Pharmacy Specialist

## 2015-07-08 ENCOUNTER — Encounter: Payer: Self-pay | Admitting: Physician Assistant

## 2015-07-08 ENCOUNTER — Encounter (HOSPITAL_COMMUNITY): Payer: Self-pay | Admitting: *Deleted

## 2015-07-08 ENCOUNTER — Ambulatory Visit (INDEPENDENT_AMBULATORY_CARE_PROVIDER_SITE_OTHER): Payer: Medicaid Other | Admitting: Physician Assistant

## 2015-07-08 ENCOUNTER — Emergency Department (HOSPITAL_COMMUNITY): Payer: Medicaid Other

## 2015-07-08 VITALS — BP 150/76 | HR 75 | Ht 71.0 in | Wt 278.0 lb

## 2015-07-08 DIAGNOSIS — I5033 Acute on chronic diastolic (congestive) heart failure: Secondary | ICD-10-CM | POA: Diagnosis present

## 2015-07-08 DIAGNOSIS — D509 Iron deficiency anemia, unspecified: Secondary | ICD-10-CM | POA: Diagnosis present

## 2015-07-08 DIAGNOSIS — Z7902 Long term (current) use of antithrombotics/antiplatelets: Secondary | ICD-10-CM

## 2015-07-08 DIAGNOSIS — I509 Heart failure, unspecified: Secondary | ICD-10-CM | POA: Diagnosis present

## 2015-07-08 DIAGNOSIS — I482 Chronic atrial fibrillation: Secondary | ICD-10-CM | POA: Diagnosis present

## 2015-07-08 DIAGNOSIS — Z23 Encounter for immunization: Secondary | ICD-10-CM | POA: Diagnosis not present

## 2015-07-08 DIAGNOSIS — I4891 Unspecified atrial fibrillation: Secondary | ICD-10-CM | POA: Diagnosis present

## 2015-07-08 DIAGNOSIS — Z7901 Long term (current) use of anticoagulants: Secondary | ICD-10-CM

## 2015-07-08 DIAGNOSIS — I5032 Chronic diastolic (congestive) heart failure: Secondary | ICD-10-CM | POA: Diagnosis present

## 2015-07-08 DIAGNOSIS — I481 Persistent atrial fibrillation: Secondary | ICD-10-CM | POA: Diagnosis present

## 2015-07-08 DIAGNOSIS — R0600 Dyspnea, unspecified: Secondary | ICD-10-CM | POA: Diagnosis not present

## 2015-07-08 DIAGNOSIS — J069 Acute upper respiratory infection, unspecified: Secondary | ICD-10-CM | POA: Diagnosis not present

## 2015-07-08 DIAGNOSIS — G4733 Obstructive sleep apnea (adult) (pediatric): Secondary | ICD-10-CM | POA: Diagnosis not present

## 2015-07-08 DIAGNOSIS — I10 Essential (primary) hypertension: Secondary | ICD-10-CM | POA: Diagnosis not present

## 2015-07-08 DIAGNOSIS — Z7982 Long term (current) use of aspirin: Secondary | ICD-10-CM | POA: Diagnosis not present

## 2015-07-08 DIAGNOSIS — E669 Obesity, unspecified: Secondary | ICD-10-CM | POA: Diagnosis present

## 2015-07-08 DIAGNOSIS — Z9119 Patient's noncompliance with other medical treatment and regimen: Secondary | ICD-10-CM

## 2015-07-08 DIAGNOSIS — Z6837 Body mass index (BMI) 37.0-37.9, adult: Secondary | ICD-10-CM | POA: Diagnosis not present

## 2015-07-08 DIAGNOSIS — Z833 Family history of diabetes mellitus: Secondary | ICD-10-CM | POA: Diagnosis not present

## 2015-07-08 DIAGNOSIS — Z87891 Personal history of nicotine dependence: Secondary | ICD-10-CM | POA: Diagnosis not present

## 2015-07-08 DIAGNOSIS — I110 Hypertensive heart disease with heart failure: Secondary | ICD-10-CM | POA: Diagnosis present

## 2015-07-08 DIAGNOSIS — I4821 Permanent atrial fibrillation: Secondary | ICD-10-CM

## 2015-07-08 DIAGNOSIS — G4731 Primary central sleep apnea: Secondary | ICD-10-CM | POA: Diagnosis present

## 2015-07-08 DIAGNOSIS — Z8249 Family history of ischemic heart disease and other diseases of the circulatory system: Secondary | ICD-10-CM

## 2015-07-08 DIAGNOSIS — R0902 Hypoxemia: Secondary | ICD-10-CM | POA: Diagnosis present

## 2015-07-08 LAB — BASIC METABOLIC PANEL
ANION GAP: 9 (ref 5–15)
BUN: 8 mg/dL (ref 6–20)
CO2: 28 mmol/L (ref 22–32)
Calcium: 8.9 mg/dL (ref 8.9–10.3)
Chloride: 104 mmol/L (ref 101–111)
Creatinine, Ser: 0.93 mg/dL (ref 0.61–1.24)
GLUCOSE: 96 mg/dL (ref 65–99)
POTASSIUM: 3.5 mmol/L (ref 3.5–5.1)
Sodium: 141 mmol/L (ref 135–145)

## 2015-07-08 LAB — CBC
HEMATOCRIT: 32.8 % — AB (ref 39.0–52.0)
HEMOGLOBIN: 9.9 g/dL — AB (ref 13.0–17.0)
MCH: 20.9 pg — ABNORMAL LOW (ref 26.0–34.0)
MCHC: 30.2 g/dL (ref 30.0–36.0)
MCV: 69.2 fL — ABNORMAL LOW (ref 78.0–100.0)
Platelets: 161 10*3/uL (ref 150–400)
RBC: 4.74 MIL/uL (ref 4.22–5.81)
RDW: 22.2 % — ABNORMAL HIGH (ref 11.5–15.5)
WBC: 5.9 10*3/uL (ref 4.0–10.5)

## 2015-07-08 LAB — I-STAT TROPONIN, ED: TROPONIN I, POC: 0.01 ng/mL (ref 0.00–0.08)

## 2015-07-08 LAB — PROTIME-INR
INR: 1.91 — AB (ref 0.00–1.49)
PROTHROMBIN TIME: 21.8 s — AB (ref 11.6–15.2)

## 2015-07-08 LAB — BRAIN NATRIURETIC PEPTIDE: B Natriuretic Peptide: 569 pg/mL — ABNORMAL HIGH (ref 0.0–100.0)

## 2015-07-08 LAB — POCT INR: INR: 2

## 2015-07-08 LAB — D-DIMER, QUANTITATIVE (NOT AT ARMC): D DIMER QUANT: 0.32 ug{FEU}/mL (ref 0.00–0.50)

## 2015-07-08 IMAGING — DX DG CHEST 2V
2 series · 2 of 2 positions shown · non-contrast
Comparison: [DATE] and [DATE]

CLINICAL DATA: Cough and shortness of breath.  Left chest pain.

EXAM:
CHEST  2 VIEW

[chest pa]
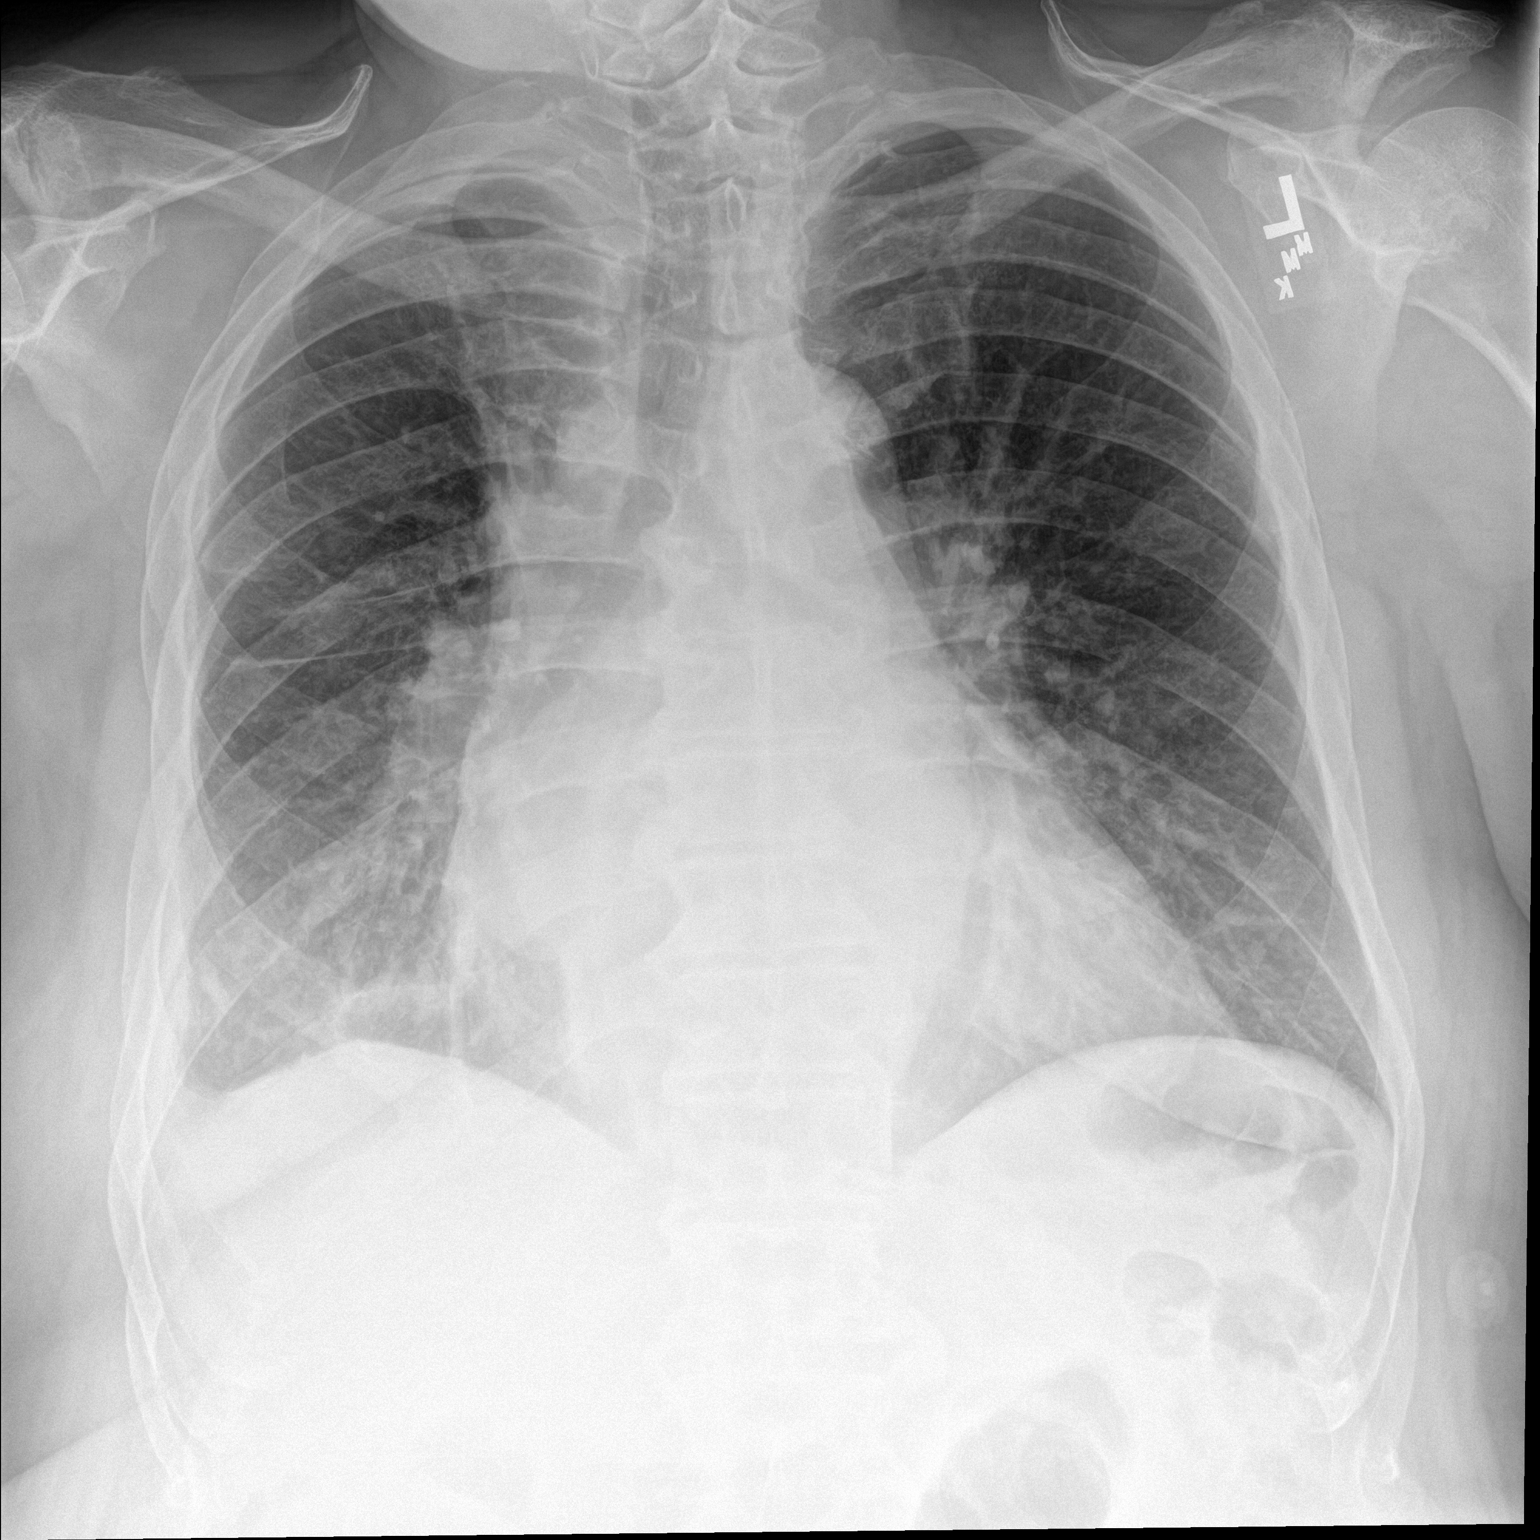

[chest lat]
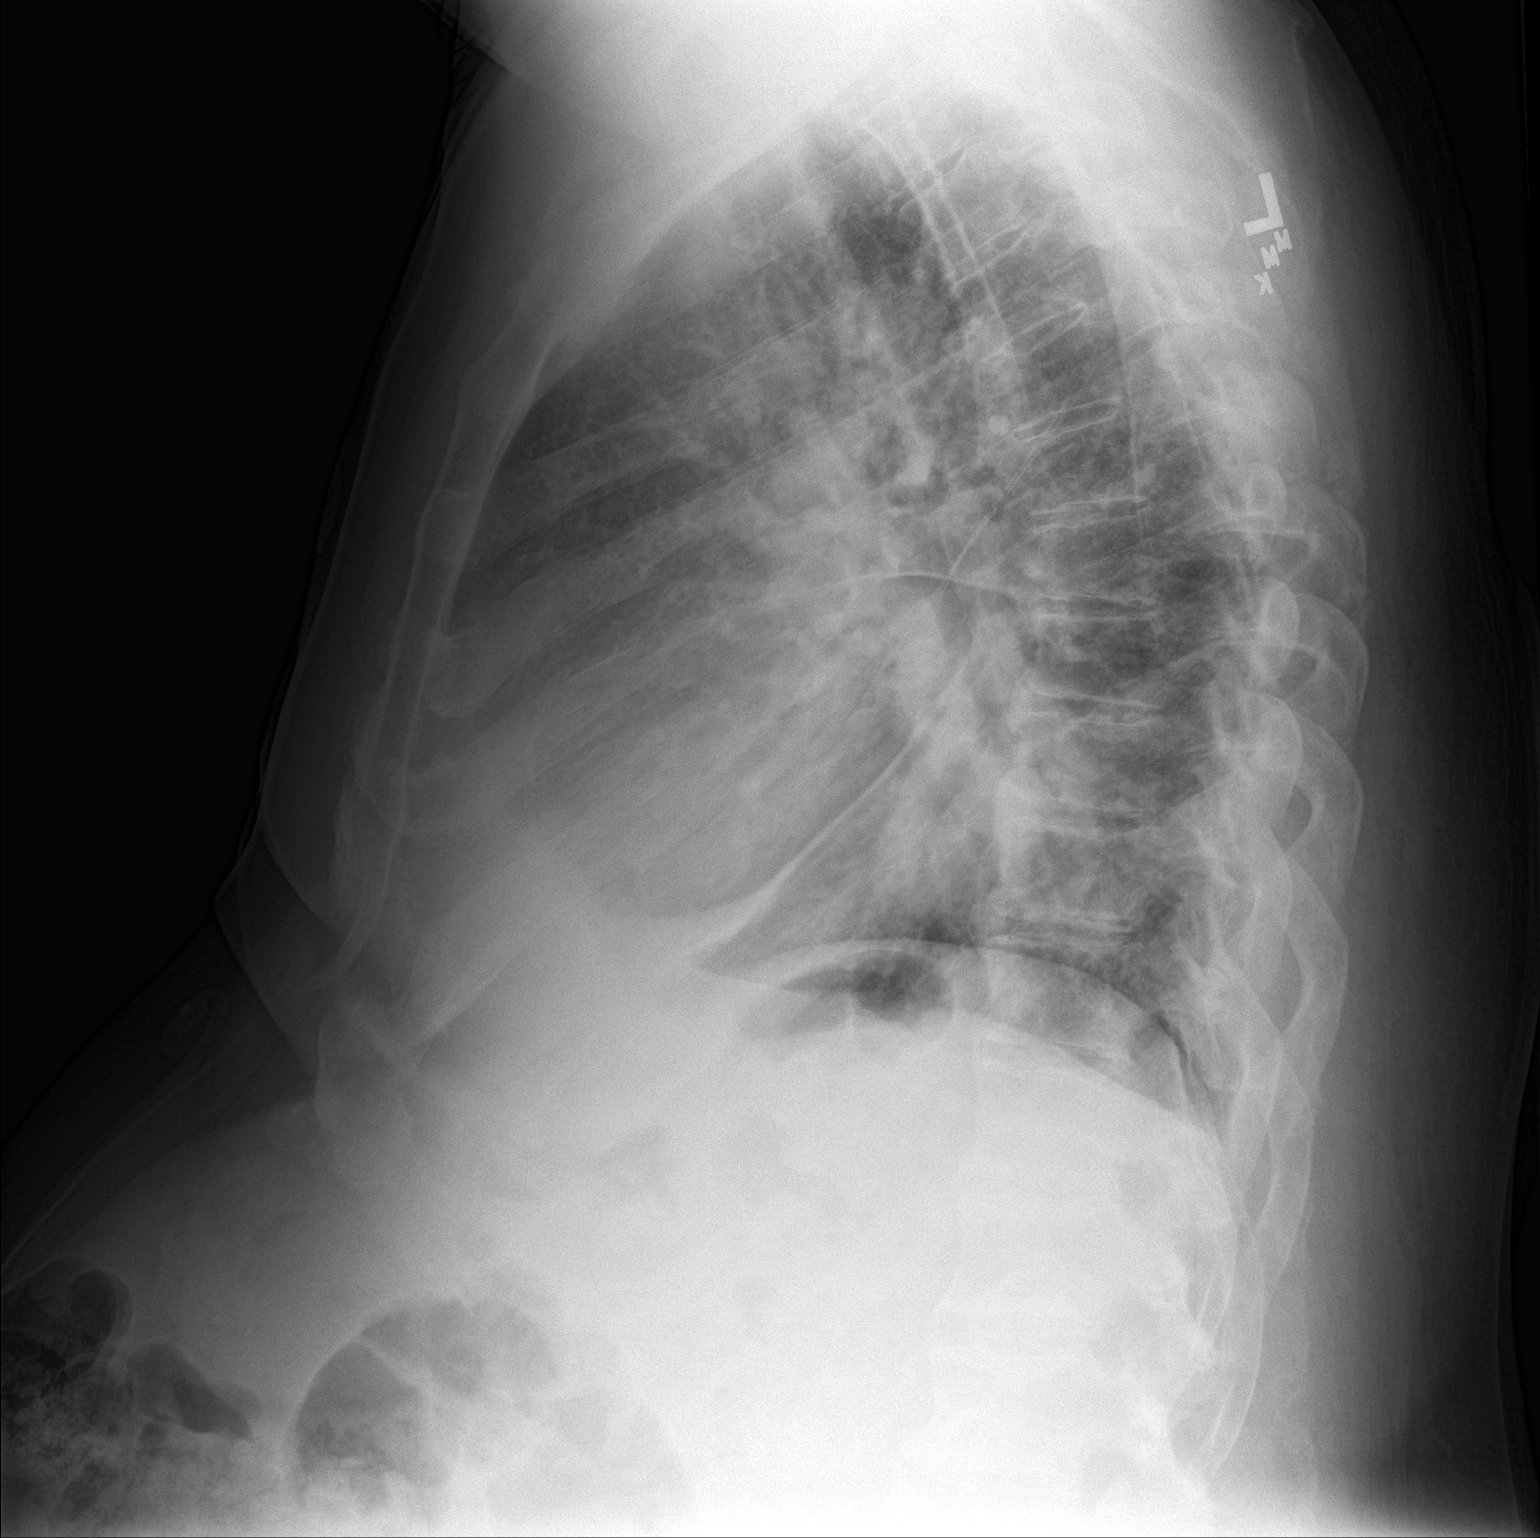

[2 of 2 positions shown; findings below may reference images not displayed]

FINDINGS: Heart size and pulmonary vascularity are normal. There is extensive
scarring in the right hemi thorax including chronic fullness at the
inferior aspect of the right hilum, blunting of the costophrenic
angle, pleural thickening, and scarring in the right upper and lower
lobes.

Small area of atelectasis/ infiltrate in the right midzone seen on
the prior study has resolved.

The left lung is clear. No effusions. No acute osseous abnormality.
IMPRESSION: No acute abnormality.  Extensive scarring in the right hemi thorax.

## 2015-07-08 MED ORDER — SODIUM CHLORIDE 0.9 % IJ SOLN
3.0000 mL | Freq: Two times a day (BID) | INTRAMUSCULAR | Status: DC
  Administered 2015-07-08 – 2015-07-12 (×8): 3 mL via INTRAVENOUS

## 2015-07-08 MED ORDER — INFLUENZA VAC SPLIT QUAD 0.5 ML IM SUSY
0.5000 mL | PREFILLED_SYRINGE | INTRAMUSCULAR | Status: AC
  Administered 2015-07-09: 0.5 mL via INTRAMUSCULAR
  Filled 2015-07-08: qty 0.5

## 2015-07-08 MED ORDER — WARFARIN SODIUM 5 MG PO TABS: 8.0000 mg | ORAL_TABLET | Freq: Every day | ORAL | Status: DC

## 2015-07-08 MED ORDER — ONDANSETRON HCL 4 MG/2ML IJ SOLN: 4.0000 mg | Freq: Four times a day (QID) | INTRAMUSCULAR | Status: DC | PRN

## 2015-07-08 MED ORDER — WARFARIN - PHYSICIAN DOSING INPATIENT: Freq: Every day | Status: DC

## 2015-07-08 MED ORDER — HYDRALAZINE HCL 50 MG PO TABS
50.0000 mg | ORAL_TABLET | Freq: Two times a day (BID) | ORAL | Status: DC
  Administered 2015-07-08 – 2015-07-12 (×7): 50 mg via ORAL
  Filled 2015-07-08 (×7): qty 1

## 2015-07-08 MED ORDER — SODIUM CHLORIDE 0.9 % IJ SOLN: 3.0000 mL | INTRAMUSCULAR | Status: DC | PRN

## 2015-07-08 MED ORDER — ACETAMINOPHEN 325 MG PO TABS
650.0000 mg | ORAL_TABLET | ORAL | Status: DC | PRN
  Administered 2015-07-08 – 2015-07-12 (×5): 650 mg via ORAL
  Filled 2015-07-08 (×5): qty 2

## 2015-07-08 MED ORDER — FUROSEMIDE 10 MG/ML IJ SOLN
80.0000 mg | Freq: Once | INTRAMUSCULAR | Status: AC
  Administered 2015-07-08: 80 mg via INTRAVENOUS
  Filled 2015-07-08: qty 8

## 2015-07-08 MED ORDER — SODIUM CHLORIDE 0.9 % IV SOLN: 250.0000 mL | INTRAVENOUS | Status: DC | PRN

## 2015-07-08 MED ORDER — FUROSEMIDE 10 MG/ML IJ SOLN
80.0000 mg | Freq: Two times a day (BID) | INTRAMUSCULAR | Status: DC
  Administered 2015-07-08: 80 mg via INTRAVENOUS
  Filled 2015-07-08: qty 8

## 2015-07-08 MED ORDER — PNEUMOCOCCAL VAC POLYVALENT 25 MCG/0.5ML IJ INJ
0.5000 mL | INJECTION | INTRAMUSCULAR | Status: AC
  Administered 2015-07-09: 0.5 mL via INTRAMUSCULAR
  Filled 2015-07-08: qty 0.5

## 2015-07-08 MED ORDER — POTASSIUM CHLORIDE CRYS ER 20 MEQ PO TBCR
30.0000 meq | EXTENDED_RELEASE_TABLET | Freq: Two times a day (BID) | ORAL | Status: DC
  Administered 2015-07-08 – 2015-07-12 (×8): 30 meq via ORAL
  Filled 2015-07-08 (×9): qty 1

## 2015-07-08 MED ORDER — WARFARIN - PHARMACIST DOSING INPATIENT
Freq: Every day | Status: DC
Start: 2015-07-09 — End: 2015-07-12
  Administered 2015-07-09: 1
  Administered 2015-07-10: 4

## 2015-07-08 MED ORDER — DILTIAZEM HCL 60 MG PO TABS
120.0000 mg | ORAL_TABLET | Freq: Every day | ORAL | Status: DC
  Administered 2015-07-09: 120 mg via ORAL
  Filled 2015-07-08: qty 2

## 2015-07-08 MED ORDER — METOPROLOL TARTRATE 12.5 MG HALF TABLET
12.5000 mg | ORAL_TABLET | Freq: Two times a day (BID) | ORAL | Status: DC
  Administered 2015-07-08: 12.5 mg via ORAL
  Filled 2015-07-08: qty 1

## 2015-07-08 MED ORDER — ASPIRIN 81 MG PO TABS: 81.0000 mg | ORAL_TABLET | Freq: Every day | ORAL | Status: DC

## 2015-07-08 MED ORDER — LISINOPRIL 20 MG PO TABS
20.0000 mg | ORAL_TABLET | Freq: Every day | ORAL | Status: DC
  Administered 2015-07-09 – 2015-07-12 (×4): 20 mg via ORAL
  Filled 2015-07-08 (×4): qty 1

## 2015-07-08 MED ORDER — ASPIRIN 81 MG PO CHEW
81.0000 mg | CHEWABLE_TABLET | Freq: Every day | ORAL | Status: DC
  Administered 2015-07-09 – 2015-07-12 (×4): 81 mg via ORAL
  Filled 2015-07-08 (×4): qty 1

## 2015-07-08 NOTE — ED Notes (Signed)
Pt placed on 2L [place] due to patient being 88%

## 2015-07-08 NOTE — ED Provider Notes (Signed)
CSN: ID:[id number]     Arrival date & time 07/08/15  1121 History   First MD Initiated Contact with Patient 07/08/15 1505     Chief Complaint  Patient presents with  . Shortness of Breath     (Consider location/radiation/quality/duration/timing/severity/associated sxs/prior Treatment) HPI Patient with increased shortness of breath over the last few days. He's had increased bilateral lower extremity swelling. He has a history of atrial fibrillation and congestive heart failure. States he's been compliant with his medication. Admits to cough with some sputum production. No fever or chills. Denies any chest pain. Past Medical History  Diagnosis Date  . Hypertension   . Permanent atrial fibrillation ([place]) 2010  . Chronic anticoagulation 2010    Coumadin  . OSA (obstructive sleep apnea) 2016    Not on CPAP   Past Surgical History  Procedure Laterality Date  . Thoracotomy Right 2010   Family History  Problem Relation Age of Onset  . Hypertension Mother   . Diabetes Mother   . Hyperlipidemia Mother   . Heart disease Father   . Heart failure Father    Social History  Substance Use Topics  . Smoking status: Former Smoker    Quit date: 12/16/1974  . Smokeless tobacco: Never Used  . Alcohol Use: No    Review of Systems  Constitutional: Negative for fever and chills.  HENT: Negative for congestion and sore throat.   Respiratory: Positive for cough and shortness of breath. Negative for wheezing.   Cardiovascular: Positive for leg swelling. Negative for chest pain and palpitations.  Gastrointestinal: Negative for nausea, vomiting, abdominal pain and diarrhea.  Genitourinary: Negative for dysuria, frequency and flank pain.  Musculoskeletal: Negative for back pain and neck pain.  Skin: Negative for rash and wound.  Neurological: Negative for dizziness, weakness, light-headedness, numbness and headaches.  All other systems reviewed and are negative.     Allergies  Review of  patient's allergies indicates no known allergies.  Home Medications   Prior to Admission medications   Medication Sig Start Date End Date Taking? Authorizing Provider  acetaminophen (TYLENOL) 500 MG tablet Take 2,000 mg by mouth 2 (two) times daily.   Yes Historical Provider, MD  aspirin 81 MG tablet Take 81 mg by mouth daily.   Yes Historical Provider, MD  Dextromethorphan-Guaifenesin (MUCINEX DM MAXIMUM STRENGTH) 60-1200 MG TB12 Take 1 tablet by mouth every 12 (twelve) hours as needed. Reported on 07/08/2015   Yes Historical Provider, MD  diltiazem (CARDIZEM) 120 MG tablet TAKE ONE TABLET BY MOUTH ONCE DAILY 03/31/15  Yes Pixie Casino, MD  furosemide (LASIX) 40 MG tablet TAKE ONE TABLET BY MOUTH TWICE DAILY 06/30/15  Yes Pixie Casino, MD  hydrALAZINE (APRESOLINE) 50 MG tablet Take 1 tablet (50 mg total) by mouth 2 (two) times daily. 07/07/14  Yes Pixie Casino, MD  ipratropium (ATROVENT HFA) 17 MCG/ACT inhaler Inhale 2 puffs into the lungs as needed for wheezing.   Yes Historical Provider, MD  lisinopril (PRINIVIL,ZESTRIL) 20 MG tablet TAKE ONE TABLET BY MOUTH ONCE DAILY 04/20/15  Yes Pixie Casino, MD  metoprolol (LOPRESSOR) 25 MG tablet Take 2 tablets (50 mg total) by mouth 2 (two) times daily. 02/10/15  Yes Brett Canales, PA-C  sildenafil (REVATIO) 20 MG tablet TAKE 2 TO 3 TABLETS BY MOUTH DAILY AS NEEDED FOR SEXUAL ACTIVITY 04/22/15  Yes Pixie Casino, MD  warfarin (COUMADIN) 4 MG tablet TAKE ONE TO TWO TABLETS BY MOUTH ONCE DAILY AS  DIRECTED  BY  COUMADIN  CLINIC Patient taking differently: TAKES 6MG  BY MOUTH ON MON ONLY, TAKES 8MG  ALL OTHER DAYS-IN MORNINGS 07/08/15  Yes Pixie Casino, MD  warfarin (COUMADIN) 2 MG tablet Take 1 tablet by mouth in addition to 4 mg tablets as directed by coumadin clinic 06/10/15   Pixie Casino, MD  warfarin (COUMADIN) 4 MG tablet Take 4 mg by mouth daily.    Historical Provider, MD   BP 137/74 mmHg  Pulse 63  Temp(Src) 98.3 F (36.8 C) (Oral)   Resp 24  Ht 5\' 10"  (1.778 m)  Wt 278 lb (126.1 kg)  BMI 39.89 kg/m2  SpO2 93% Physical Exam  Constitutional: He is oriented to person, place, and time. He appears well-developed and well-nourished. No distress.  HENT:  Head: Normocephalic and atraumatic.  Mouth/Throat: Oropharynx is clear and moist.  Eyes: EOM are normal. Pupils are equal, round, and reactive to light.  Neck: Normal range of motion. Neck supple.  Cardiovascular: Regular rhythm.   Irregularly irregular. Bradycardic  Pulmonary/Chest: Effort normal. No respiratory distress. He has no wheezes. He has rales.  Crackles in the right base  Abdominal: Soft. Bowel sounds are normal. He exhibits no distension and no mass. There is no tenderness. There is no rebound and no guarding.  Musculoskeletal: Normal range of motion. He exhibits edema. He exhibits no tenderness.  2+ bilateral pitting edema to lower extremities. Distal pulses intact and symmetric. No calf tenderness.  Neurological: He is alert and oriented to person, place, and time.  Moving all extremities without deficit. Sensation is fully intact.  Skin: Skin is warm and dry. No rash noted. No erythema.  Psychiatric: He has a normal mood and affect. His behavior is normal.  Nursing note and vitals reviewed.   ED Course  Procedures (including critical care time) Labs Review Labs Reviewed  CBC - Abnormal; Notable for the following:    Hemoglobin 9.9 (*)    HCT 32.8 (*)    MCV 69.2 (*)    MCH 20.9 (*)    RDW 22.2 (*)    All other components within normal limits  BRAIN NATRIURETIC PEPTIDE - Abnormal; Notable for the following:    B Natriuretic Peptide 569.0 (*)    All other components within normal limits  PROTIME-INR - Abnormal; Notable for the following:    Prothrombin Time 21.8 (*)    INR 1.91 (*)    All other components within normal limits  BASIC METABOLIC PANEL  D-DIMER, QUANTITATIVE (NOT AT [facility])  Randolm Idol, ED    Imaging Review Dg Chest 2  View  07/08/2015  CLINICAL DATA:  Cough and shortness of breath.  Left chest pain. EXAM: CHEST  2 VIEW COMPARISON:  02/10/2015 and 02/02/2009 FINDINGS: Heart size and pulmonary vascularity are normal. There is extensive scarring in the right hemi thorax including chronic fullness at the inferior aspect of the right hilum, blunting of the costophrenic angle, pleural thickening, and scarring in the right upper and lower lobes. Small area of atelectasis/ infiltrate in the right midzone seen on the prior study has resolved. The left lung is clear. No effusions. No acute osseous abnormality. IMPRESSION: No acute abnormality.  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 Electronically Signed   By: Lorriane Shire M.D.   On: 07/08/2015 12:42   I have personally reviewed and evaluated these images and lab results as part of my medical decision-making.   EKG Interpretation   Date/Time:  Wednesday July 08 2015 11:31:32 EST  Ventricular Rate:  75 PR Interval:    QRS Duration: 92 QT Interval:  474 QTC Calculation: 529 R Axis:   -8 Text Interpretation:  Atrial fibrillation Septal infarct , age  undetermined Prolonged QT Abnormal ECG Confirmed by Cari Burgo  MD, Patrina Andreas  ([id number]) on 07/08/2015 3:07:03 PM      MDM   Final diagnoses:  Acute on chronic congestive heart failure, unspecified congestive heart failure type ([place])   Patient had oxygen saturations dropped into the 80s while standing. He's also had multiple episodes where his heart rate dropped into the 30s as well. Blood pressure remains stable. Patient is diuresing well in the emergency department.   This with Dr. Myna Hidalgo. Will admit to telemetry bed.  Julianne Rice, MD 07/08/15 Tresa Moore

## 2015-07-08 NOTE — ED Notes (Signed)
O2 saturation in the 80's upon standing. MD aware.

## 2015-07-08 NOTE — ED Notes (Signed)
Labs will be added on per main lab.

## 2015-07-08 NOTE — ED Notes (Signed)
D-dimer will be added on per lab.

## 2015-07-08 NOTE — Progress Notes (Signed)
Patient arrived to unit from ED via stretcher. Patient alert, oriented and ambulatory. Admission weight, vitals and assessment completed. Fall and safety plan reviewed with patient and wife-at this time patient refusing bed alarm.  Patient stated he would call for assistance. Will continue to monitor patient and offer bathroom assistance.  Blood pressure 154/78, pulse 73, temperature 97.6 F (36.4 C), temperature source Oral, resp. rate 18, height 5\' 10"  (1.778 m), weight 122.244 kg (269 lb 8 oz), SpO2 95 %. Tresa Endo

## 2015-07-08 NOTE — ED Notes (Signed)
MD aware of pt's increasing bradycardic episodes, no new orders at this time just to monitor BP closely.

## 2015-07-08 NOTE — ED Notes (Signed)
Pt reports sob and not feeling well for several days, went to [facility] today and sent here for possible admission due to fluid overload. reports swelling to legs and cough, denies fever.

## 2015-07-08 NOTE — Patient Instructions (Signed)
Medication Instructions:  Your physician recommends that you continue on your current medications as directed. Please refer to the Current Medication list given to you today.   Labwork: None ordered  Testing/Procedures: None ordered  Follow-Up: Please go to [facility] for further evaluation   Follow up with cardiology as planned  Any Other Special Instructions Will Be Listed Below (If Applicable).     If you need a refill on your cardiac medications before your next appointment, please call your pharmacy.

## 2015-07-08 NOTE — Progress Notes (Signed)
ANTICOAGULATION CONSULT NOTE - Initial Consult  Pharmacy Consult for Coumadin Indication: atrial fibrillation  No Known Allergies  Patient Measurements: Height: 5\' 10"  (177.8 cm) Weight: 269 lb 8 oz (122.244 kg) IBW/kg (Calculated) : 73  Vital Signs: Temp: 97.6 F (36.4 C) (01/04 1939) Temp Source: Oral (01/04 1939) BP: 154/78 mmHg (01/04 1939) Pulse Rate: 73 (01/04 1939)  Labs:  Recent Labs  07/08/15 0933 07/08/15 1134  HGB  --  9.9*  HCT  --  32.8*  PLT  --  161  LABPROT  --  21.8*  INR 2 1.91*  CREATININE  --  0.93    Estimated Creatinine Clearance: 106.6 mL/min (by C-G formula based on Cr of 0.93).   Medical History: Past Medical History  Diagnosis Date  . Hypertension   . Permanent atrial fibrillation ([place]) 2010  . Chronic anticoagulation 2010    Coumadin  . OSA (obstructive sleep apnea) 2016    Not on CPAP  . Shortness of breath dyspnea   . CHF (congestive heart failure) [facility])     Assessment: 65 yo M presents on 1/4 with SOB. On coumadin 8mg  daily exc 6mg  on Monday. Last dose was 8mg  and taken this am. PTA INR on admit is 1.91. Hgb 9.9, plts wnl. No s/s of bleed.  Goal of Therapy:  INR 2-3 Monitor platelets by anticoagulation protocol: Yes   Plan:  Hold dose tonight (already received dose this am) Monitor daily INR, CBC, s/s of bleed  Elenor Quinones, PharmD, [facility] Clinical Pharmacist Pager [phone] 07/08/2015 7:51 PM

## 2015-07-08 NOTE — H&P (Signed)
[facility] History and Physical  Sakae Borowsky [id number] DOB: [birth year] DOA: 07/08/2015  Referring physician: ED physician PCP: Elisabeth Cara, PA-C  Specialists:   Chief Complaint: SOB, edema   HPI: Steven Barnett is a 65 y.o. male with PMH of chronic atrial fibrillation on Coumadin, chronic hypertension, chronic diastolic CHF, obesity, and obstructive sleep apnea who had presented to his cardiology clinic earlier in the day for INR check and was directed to the ED for dyspnea at rest. He reports progressively worsening dyspnea over the last 2-3 weeks. He denies any fevers, chills, or productive cough over this interval, but does endorse orthopnea and lower extremity edema. Respiratory symptoms have progressed and are now present at rest. Patient denies chest pains or palpitations. Patient has been taking Lasix 40 mg twice daily, reporting compliance and denying any recent change in diet, fluid intake, or medications. He has not noticed any change in urine output.  In ED, patient was found to be saturating in the 80s on room air, recovering to the mid 90s on nasal cannula. He was afebrile, tachypnea to the 30s, and with vital signs otherwise stable. EKG featured atrial fibrillation, rate controlled and not significantly changed from priors, and chest x-ray appeared stable and negative for acute disease. Blood work returned notable for BNP of 569, microcytic anemia, and subtherapeutic INR at 1.9. Patient was given an 80 mg IV push of Lasix and has been diuresing well, but is still requiring supplemental oxygen to maintain adequate saturations while at rest. Hospitalists were asked to admit.  Where does patient live?   At home     Can patient participate in ADLs?  Yes    Review of Systems:   General: no fevers, chills, sweats, weight change, or poor appetite.  Fatigue HEENT: no blurry vision, hearing changes or sore throat Pulm:  Dyspnea at rest, non-productive cough,  no  wheeze CV: no chest pain or palpitations, orthopnea Abd: no nausea, vomiting, abdominal pain, diarrhea, or constipation GU: no dysuria, hematuria, increased urinary frequency, or urgency  Ext: Bilateral LE edema  Neuro: no focal weakness, numbness, or tingling, no vision change or hearing loss Skin: no rash, no wounds MSK: No muscle spasm, no deformity, no red, hot, or swollen joint Heme: No easy bruising or bleeding Travel history: No recent long distant travel    Allergy: No Known Allergies  Past Medical History  Diagnosis Date  . Hypertension   . Permanent atrial fibrillation ([place]) 2010  . Chronic anticoagulation 2010    Coumadin  . OSA (obstructive sleep apnea) 2016    Not on CPAP  . Shortness of breath dyspnea   . CHF (congestive heart failure) [facility])     Past Surgical History  Procedure Laterality Date  . Thoracotomy Right 2010    Social History:  reports that he quit smoking about 40 years ago. He has never used smokeless tobacco. He reports that he does not drink alcohol or use illicit drugs.  Family History:  Family History  Problem Relation Age of Onset  . Hypertension Mother   . Diabetes Mother   . Hyperlipidemia Mother   . Heart disease Father   . Heart failure Father      Prior to Admission medications   Medication Sig Start Date End Date Taking? Authorizing Provider  acetaminophen (TYLENOL) 500 MG tablet Take 2,000 mg by mouth 2 (two) times daily.   Yes Historical Provider, MD  aspirin 81 MG tablet Take 81 mg by mouth daily.  Yes Historical Provider, MD  Dextromethorphan-Guaifenesin (MUCINEX DM MAXIMUM STRENGTH) 60-1200 MG TB12 Take 1 tablet by mouth every 12 (twelve) hours as needed. Reported on 07/08/2015   Yes Historical Provider, MD  diltiazem (CARDIZEM) 120 MG tablet TAKE ONE TABLET BY MOUTH ONCE DAILY 03/31/15  Yes Pixie Casino, MD  furosemide (LASIX) 40 MG tablet TAKE ONE TABLET BY MOUTH TWICE DAILY 06/30/15  Yes Pixie Casino, MD  hydrALAZINE  (APRESOLINE) 50 MG tablet Take 1 tablet (50 mg total) by mouth 2 (two) times daily. 07/07/14  Yes Pixie Casino, MD  ipratropium (ATROVENT HFA) 17 MCG/ACT inhaler Inhale 2 puffs into the lungs as needed for wheezing.   Yes Historical Provider, MD  lisinopril (PRINIVIL,ZESTRIL) 20 MG tablet TAKE ONE TABLET BY MOUTH ONCE DAILY 04/20/15  Yes Pixie Casino, MD  metoprolol (LOPRESSOR) 25 MG tablet Take 2 tablets (50 mg total) by mouth 2 (two) times daily. 02/10/15  Yes Brett Canales, PA-C  sildenafil (REVATIO) 20 MG tablet TAKE 2 TO 3 TABLETS BY MOUTH DAILY AS NEEDED FOR SEXUAL ACTIVITY 04/22/15  Yes Pixie Casino, MD  warfarin (COUMADIN) 4 MG tablet TAKE ONE TO TWO TABLETS BY MOUTH ONCE DAILY AS  DIRECTED  BY  COUMADIN  CLINIC Patient taking differently: TAKES 6MG  BY MOUTH ON MON ONLY, TAKES 8MG  ALL OTHER DAYS-IN MORNINGS 07/08/15  Yes Pixie Casino, MD  warfarin (COUMADIN) 2 MG tablet Take 1 tablet by mouth in addition to 4 mg tablets as directed by coumadin clinic 06/10/15   Pixie Casino, MD  warfarin (COUMADIN) 4 MG tablet Take 4 mg by mouth daily.    Historical Provider, MD    Physical Exam: Filed Vitals:   07/08/15 1800 07/08/15 1830 07/08/15 1900 07/08/15 1939  BP: 143/75 125/58 135/88 154/78  Pulse: 67 70 73 73  Temp:    97.6 F (36.4 C)  TempSrc:    Oral  Resp: 21 23 18 18   Height:    5\' 10"  (1.778 m)  Weight:    122.244 kg (269 lb 8 oz)  SpO2: 88% 95% 97% 95%   General: In mild respiratory distress, gasping between sentences  HEENT:       Eyes: PERRL, EOMI, no scleral icterus or conjunctival pallor.       ENT: No discharge from the ears or nose, no pharyngeal ulcers, petechiae or exudate, no tonsillar enlargement.        Neck: No JVD appreciated while upright, no bruit, no appreciable mass Heme: No cervical adenopathy, no pallor Cardiac: Rate ~100 and irregular, No murmurs, No gallops or rubs. Pulm: Good air movement bilaterally. Tachypneic, dyspneic speaking, bibasilar  crackles . Abd: Soft, nondistended, nontender, no rebound pain or gaurding, no mass or organomegaly, BS present. Ext: b/l LE edema to thighs. 2+DP/PT pulse bilaterally. Musculoskeletal: No gross deformity, no red, hot, swollen joints   Skin: No rashes or wounds on exposed surfaces  Neuro: Alert, oriented X3, cranial nerves II-XII grossly intact. No focal findings Psych: Patient is not overtly psychotic, appropriate mood and affect   Labs on Admission:  Basic Metabolic Panel:  Recent Labs Lab 07/08/15 1134  NA 141  K 3.5  CL 104  CO2 28  GLUCOSE 96  BUN 8  CREATININE 0.93  CALCIUM 8.9   Liver Function Tests: No results for input(s): AST, ALT, ALKPHOS, BILITOT, PROT, ALBUMIN in the last 168 hours. No results for input(s): LIPASE, AMYLASE in the last 168 hours. No results for input(s):  AMMONIA in the last 168 hours. CBC:  Recent Labs Lab 07/08/15 1134  WBC 5.9  HGB 9.9*  HCT 32.8*  MCV 69.2*  PLT 161   Cardiac Enzymes: No results for input(s): CKTOTAL, CKMB, CKMBINDEX, TROPONINI in the last 168 hours.  BNP (last 3 results)  Recent Labs  07/08/15 1601  BNP 569.0*    ProBNP (last 3 results) No results for input(s): PROBNP in the last 8760 hours.  CBG: No results for input(s): GLUCAP in the last 168 hours.  Radiological Exams on Admission: Dg Chest 2 View  07/08/2015  CLINICAL DATA:  Cough and shortness of breath.  Left chest pain. EXAM: CHEST  2 VIEW COMPARISON:  02/10/2015 and 02/02/2009 FINDINGS: Heart size and pulmonary vascularity are normal. There is extensive scarring in the right hemi thorax including chronic fullness at the inferior aspect of the right hilum, blunting of the costophrenic angle, pleural thickening, and scarring in the right upper and lower lobes. Small area of atelectasis/ infiltrate in the right midzone seen on the prior study has resolved. The left lung is clear. No effusions. No acute osseous abnormality. IMPRESSION: No acute abnormality.   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 Electronically Signed   By: Lorriane Shire M.D.   On: 07/08/2015 12:42    EKG: Independently reviewed.  Abnormal findings:  Atrial fib, septal Q-waves   Assessment/Plan  1. Acute on chronic diastolic CHF  - CXR not impressive, but BNP elevated, LEs edematous, neck veins distended, orthopnea  - Diuresing well after 80 mg IV Lasix in ED, will continue with 40 mg IV BID  - SLIV, fluid-restrict, strict I/Os, daily wts  - Cont ACEi, holding beta-blocker given bradycardia and acute failure - TTE (11/20/2008) with EF 55-60%, mod LVH - Plan to repeat TTE this admission, ordered   2. Chronic Atrial fib  - On coumadin, subtherapeutic at 1.9 - Continue coumadin with pharmacy consultation  - Rate is well-controlled on dilt, continuing  - Monitoring on tele    3. Hypertension - BP at goal currently  - Continuing lisinopril, diltiazem, hydralazine  - Holding metoprolol for bradycardia and acute heart failure  - Resume beta-blocker as appropriate   4. OSA  - CPAP qHS    DVT ppx: continue coumadin  Code Status: Full code Family Communication: Yes, patient's wife at bed side Disposition Plan: Admit to inpatient   Date of Service 07/08/2015    Vianne Bulls, MD [facility] Pager [phone]  If 7PM-7AM, please contact night-coverage [email or web address] Password [email or web address] 07/08/2015, 7:51 PM

## 2015-07-08 NOTE — Progress Notes (Signed)
Cardiology Office Note   Date:  07/08/2015   ID:  Steven Barnett, DOB 1950/11/05, MRN [id number]  PCP:  Elisabeth Cara, PA-C  Cardiologist:  Dr. Marchelle Folks, PA-C   Chief Complaint  Patient presents with  . Shortness of Breath    History of Present Illness: Steven Barnett is a 65 y.o. male with a history of atrial fibrillation on Coumadin, HTN, OSA, D-CHF w/ moderate LVH (dx 2010), morbid obesity, ventral hernia, polysubs abuse (quit 2008).  Steven Barnett presents for evaluation shortness of breath.  Steven Barnett came to the office today for Coumadin check. In the office, he was coughing and very short of breath and so was added onto the schedule.  Steven Barnett states he has been coughing for a week or more. The cough has been productive of yellow phlegm. He denies fevers or chills. He has been treating the cough with over-the-counter cold medicines without success.   Over the last couple of days, the cough is been much worse. He is not getting any sleep because he cannot lie down and because he keeps coughing. He describes orthopnea and PND. He has lower extremity edema which she says is new. He is not weighing himself so he doesn't know anything about his weights. He states he was trying to drink a lot of water because of the cold.  He is having chest pain, it is on the left side and just under the left breast. It is only during coughing and that area is tender to palpation.  He has not had chest pain prior to this acute illness. He does not have palpitations and has no awareness of the atrial fibrillation. He is taking a great deal of Tylenol as well. He has lost about 20 pounds since he was last seen in September but has not been on a weight loss diet. He had a sleep study which diagnosed sleep apnea, but has not been on CPAP. He has frequent nocturia.   Past Medical History  Diagnosis Date  . Hypertension   . Permanent atrial fibrillation ([place]) 2010  . Chronic  anticoagulation 2010    Coumadin  . OSA (obstructive sleep apnea) 2016    Not on CPAP    Past Surgical History  Procedure Laterality Date  . Thoracotomy Right 2010    Current Outpatient Prescriptions  Medication Sig Dispense Refill  . acetaminophen (TYLENOL) 500 MG tablet Take 2000 mg by mouth 2 (two) times daily.    Marland Kitchen aspirin 81 MG tablet Take 81 mg by mouth daily.    Marland Kitchen Dextromethorphan-Guaifenesin (MUCINEX DM MAXIMUM STRENGTH) 60-1200 MG TB12 Take 1 tablet by mouth every 12 (twelve) hours as needed. Reported on 07/08/2015    . diltiazem (CARDIZEM) 120 MG tablet TAKE ONE TABLET BY MOUTH ONCE DAILY 90 tablet 0  . furosemide (LASIX) 40 MG tablet TAKE ONE TABLET BY MOUTH TWICE DAILY 30 tablet 5  . hydrALAZINE (APRESOLINE) 50 MG tablet Take 1 tablet (50 mg total) by mouth 2 (two) times daily. 60 tablet 9  . ipratropium (ATROVENT HFA) 17 MCG/ACT inhaler Inhale 2 puffs into the lungs as needed for wheezing.    Marland Kitchen lisinopril (PRINIVIL,ZESTRIL) 20 MG tablet TAKE ONE TABLET BY MOUTH ONCE DAILY 30 tablet 11  . metoprolol (LOPRESSOR) 25 MG tablet Take 2 tablets (50 mg total) by mouth 2 (two) times daily. 60 tablet 5  . sildenafil (REVATIO) 20 MG tablet TAKE 2 TO 3 TABLETS BY MOUTH DAILY AS NEEDED  FOR SEXUAL ACTIVITY 30 tablet 0  . warfarin (COUMADIN) 2 MG tablet Take 1 tablet by mouth in addition to 4 mg tablets as directed by coumadin clinic 30 tablet 0  . warfarin (COUMADIN) 4 MG tablet TAKE ONE TO TWO TABLETS BY MOUTH ONCE DAILY AS DIRECTED BY COUMADIN CLINIC 40 tablet 5   No current facility-administered medications for this visit.    Allergies:   Review of patient's allergies indicates no known allergies.    Social History:  The patient  reports that he quit smoking about 40 years ago. He has never used smokeless tobacco. He reports that he does not drink alcohol or use illicit drugs.   Family History:  The patient's family history includes Diabetes in his mother; Heart disease in his  father; Heart failure in his father; Hyperlipidemia in his mother; Hypertension in his mother.    ROS:  Please see the history of present illness. All other systems are reviewed and negative.    PHYSICAL EXAM: VS:  BP 150/76 mmHg  Pulse 75  Ht 5\' 11"  (1.803 m)  Wt 278 lb (126.1 kg)  BMI 38.79 kg/m2 , BMI Body mass index is 38.79 kg/(m^2). GEN: Well nourished, well developed, male in acute respiratory distress HEENT: normal for age  Neck: JVD at 9 cm, positive hepatojugular reflux, no carotid bruit, no masses Cardiac: Irregular rate and rhythm; no murmur, no rubs, or gallops Respiratory:  Decreased breath sounds in both bases bilaterally, increased work of breathing GI: soft, nontender, nondistended, + BS MS: no deformity or atrophy; 1-2 plus edema; distal pulses are 2+ in all 4 extremities  Skin: warm and dry, no rash Neuro:  Strength and sensation are intact  EKG:  EKG is ordered today. The ekg ordered today demonstrates atrial fibrillation, rate 75, nonspecific T-wave abnormality   Recent Labs: 02/10/2015: Hemoglobin 11.0*; Platelets 256    Lipid Panel    Component Value Date/Time   CHOL  11/20/2008 0420    93        ATP III CLASSIFICATION:  <200     mg/dL   Desirable  200-239  mg/dL   Borderline High  >=240    mg/dL   High          TRIG 31 11/20/2008 0420   HDL 42 11/20/2008 0420   CHOLHDL 2.2 11/20/2008 0420   VLDL 6 11/20/2008 0420   LDLCALC 45 11/20/2008 0420     Wt Readings from Last 3 Encounters:  07/08/15 278 lb (126.1 kg)  04/01/15 296 lb (134.265 kg)  04/01/15 296 lb 1.6 oz (134.31 kg)     Other studies Reviewed: Additional studies/ records that were reviewed today include: Office Notes and hospital records.  ASSESSMENT AND PLAN:  1.  Acute on chronic diastolic CHF: Steven Barnett does not monitor his weight, he does not monitor his sodium or fluid intake. He has volume overload but because he has lost weight, the amount is not clear. He needs some IV  Lasix but may be able to get by with one dose in the emergency room.  He does not have scales and I will work with the CHF RN to get him a set.  2. Upper respiratory infection: He has not had fevers and so this may be viral. Unfortunately, the combination of the upper respiratory infection in the CHF have given him significant shortness of breath.  3. Atrial fibrillation: He is on Cardizem and his rate is controlled  4. Chronic anticoagulation with Coumadin:  He does not follow consistently but comes in occasionally for Coumadin checks. He will get one today in the office.  5. Sleep apnea: Part of his difficulty sleeping maybe cut because of his sleep apnea, but he also describes nocturia. Would move his p.m. dose of Lasix to 3:00 in the afternoon to see if this helps but he also needs to get CPAP.   Current medicines are reviewed at length with the patient today.  The patient does not have concerns regarding medicines.  The following changes have been made:  Changes to be determined once the patient is evaluated in the emergency room  Labs/ tests ordered today include:   ECG  Disposition:   I gave Steven Barnett my firmest recommendation that he go to the emergency room. He needs at least one dose of IV Lasix 40 mg, a chest x-ray, and labs to check his BMET, LFTs, CBC and a BNP.    If his symptoms can be controlled and his volume decreased, he may be able to go home, as he states he does not wish to be admitted.  FU with Dr. [name]  Signed, Lenoard Aden  07/08/2015 10:38 AM    [address], [place], [place]  [address] Phone: [phone]; Fax: [phone]

## 2015-07-08 NOTE — ED Notes (Signed)
Pt having continuous HR's in the 57's, MD aware, EKG captured and given to MD.

## 2015-07-09 ENCOUNTER — Inpatient Hospital Stay (HOSPITAL_COMMUNITY): Payer: Medicaid Other

## 2015-07-09 DIAGNOSIS — I10 Essential (primary) hypertension: Secondary | ICD-10-CM

## 2015-07-09 DIAGNOSIS — R0600 Dyspnea, unspecified: Secondary | ICD-10-CM

## 2015-07-09 DIAGNOSIS — I482 Chronic atrial fibrillation: Secondary | ICD-10-CM

## 2015-07-09 DIAGNOSIS — I5033 Acute on chronic diastolic (congestive) heart failure: Secondary | ICD-10-CM

## 2015-07-09 DIAGNOSIS — G4733 Obstructive sleep apnea (adult) (pediatric): Secondary | ICD-10-CM

## 2015-07-09 LAB — RETICULOCYTES
RBC.: 4.82 MIL/uL (ref 4.22–5.81)
Retic Count, Absolute: 130.1 10*3/uL (ref 19.0–186.0)
Retic Ct Pct: 2.7 % (ref 0.4–3.1)

## 2015-07-09 LAB — MAGNESIUM: Magnesium: 1.9 mg/dL (ref 1.7–2.4)

## 2015-07-09 LAB — PROTIME-INR
INR: 1.88 — ABNORMAL HIGH (ref 0.00–1.49)
Prothrombin Time: 21.5 seconds — ABNORMAL HIGH (ref 11.6–15.2)

## 2015-07-09 LAB — FERRITIN: FERRITIN: 24 ng/mL (ref 24–336)

## 2015-07-09 LAB — IRON AND TIBC
Iron: 32 ug/dL — ABNORMAL LOW (ref 45–182)
SATURATION RATIOS: 7 % — AB (ref 17.9–39.5)
TIBC: 435 ug/dL (ref 250–450)
UIBC: 403 ug/dL

## 2015-07-09 MED ORDER — FUROSEMIDE 10 MG/ML IJ SOLN
40.0000 mg | Freq: Two times a day (BID) | INTRAMUSCULAR | Status: DC
Start: 2015-07-09 — End: 2015-07-12
  Administered 2015-07-09 – 2015-07-12 (×7): 40 mg via INTRAVENOUS
  Filled 2015-07-09 (×7): qty 4

## 2015-07-09 MED ORDER — WARFARIN SODIUM 10 MG PO TABS
10.0000 mg | ORAL_TABLET | Freq: Once | ORAL | Status: AC
  Administered 2015-07-09: 10 mg via ORAL
  Filled 2015-07-09: qty 1

## 2015-07-09 NOTE — Progress Notes (Signed)
  Echocardiogram 2D Echocardiogram has been performed.  Johny Chess 07/09/2015, 10:45 AM

## 2015-07-09 NOTE — Progress Notes (Signed)
Utilization review completed. Cara Thaxton, RN, BSN. 

## 2015-07-09 NOTE — Consult Note (Signed)
CONSULTATION NOTE  Reason for Consult: CHF   Requesting Physician: Dr. Tana Coast  Cardiologist: Dr. [name]  HPI: This is a 65 y.o. male with a history significant for hypertension, sleep disorder consistent with obstructive sleep apnea, recent diagnosis of diastolic heart failure with moderate LVH and recent diagnosis of postoperative atrial fibrillation (continues to be in atrial fibrillation) presenting with atypical chest pain. He patient was hospitalized in May 2010 and diagnosed with a right-sided empyema, for which he underwent a thoracotomy. He was found to be in atrial fibrillation postoperatively and has been in this rhythm ever since. At that time, he was also diagnosed with diastolic heart failure and found to have moderate left ventricular hypertrophy by echocardiogram.  Unfortunately, he was incarcerated for several years and has not been followed by cardiologist until recently. His warfarin has been continued while in jail however was not adjusted regularly. He recently saw a new primary care provider with [facility] and he is here to reestablish cardiac care and followup of his warfarin. He was seen in the office by Rosaria Ferries, PA-C, after complaining of significant shortness of breath and productive cough of >1 week. He actually has had 20 lb weight loss, which was intentional. He was felt to be in acute CHF and sent to the ER. There may have been a concomitant URI. In the ER he was found to have O2 sats in the 80's and was tachypneic. BNP was elevated at 569. INR was barely subtherapeutic at 1.9. Repeat echo was performed today shows no change in LVEF of 55-60%, moderate LVH, moderate to severe biatrial enlargement. He has diuresed almost 6L negative since admission.  Appears very somnolent today.  PMHx:  Past Medical History  Diagnosis Date  . Hypertension   . Permanent atrial fibrillation ([place]) 2010  . Chronic anticoagulation 2010    Coumadin  . OSA  (obstructive sleep apnea) 2016    Not on CPAP  . Shortness of breath dyspnea   . CHF (congestive heart failure) [facility])    Past Surgical History  Procedure Laterality Date  . Thoracotomy Right 2010    FAMHx: Family History  Problem Relation Age of Onset  . Hypertension Mother   . Diabetes Mother   . Hyperlipidemia Mother   . Heart disease Father   . Heart failure Father     SOCHx:  reports that he quit smoking about 40 years ago. He has never used smokeless tobacco. He reports that he does not drink alcohol or use illicit drugs.  ALLERGIES: No Known Allergies  ROS: A comprehensive review of systems was negative except for: Respiratory: positive for dyspnea on exertion  HOME MEDICATIONS:   Medication List    ASK your doctor about these medications        acetaminophen 500 MG tablet  Commonly known as:  TYLENOL  Take 2,000 mg by mouth 2 (two) times daily.     aspirin 81 MG tablet  Take 81 mg by mouth daily.     diltiazem 120 MG tablet  Commonly known as:  CARDIZEM  TAKE ONE TABLET BY MOUTH ONCE DAILY     furosemide 40 MG tablet  Commonly known as:  LASIX  TAKE ONE TABLET BY MOUTH TWICE DAILY     hydrALAZINE 50 MG tablet  Commonly known as:  APRESOLINE  Take 1 tablet (50 mg total) by mouth 2 (two) times daily.     ipratropium 17 MCG/ACT inhaler  Commonly known as:  ATROVENT  HFA  Inhale 2 puffs into the lungs as needed for wheezing.     lisinopril 20 MG tablet  Commonly known as:  PRINIVIL,ZESTRIL  TAKE ONE TABLET BY MOUTH ONCE DAILY     metoprolol tartrate 25 MG tablet  Commonly known as:  LOPRESSOR  Take 2 tablets (50 mg total) by mouth 2 (two) times daily.     MUCINEX DM MAXIMUM STRENGTH 60-1200 MG Tb12  Take 1 tablet by mouth every 12 (twelve) hours as needed. Reported on 07/08/2015     sildenafil 20 MG tablet  Commonly known as:  REVATIO  TAKE 2 TO 3 TABLETS BY MOUTH DAILY AS NEEDED FOR SEXUAL ACTIVITY     warfarin 4 MG tablet  Commonly known as:   COUMADIN  Take 4 mg by mouth daily.     warfarin 2 MG tablet  Commonly known as:  COUMADIN  Take 1 tablet by mouth in addition to 4 mg tablets as directed by coumadin clinic     warfarin 4 MG tablet  Commonly known as:  COUMADIN  TAKE ONE TO TWO TABLETS BY MOUTH ONCE DAILY AS  DIRECTED  BY  COUMADIN  CLINIC        HOSPITAL MEDICATIONS: I have reviewed the patient's current medications.  VITALS: Blood pressure 142/76, pulse 95, temperature 99.7 F (37.6 C), temperature source Oral, resp. rate 18, height 5' 10"  (1.778 m), weight 263 lb 14.4 oz (119.704 kg), SpO2 91 %.  PHYSICAL EXAM: General appearance: alert, fatigued and no distress Neck: JVD - 3 cm above sternal notch and no carotid bruit Lungs: diminished breath sounds bilaterally Heart: irregularly irregular rhythm Abdomen: soft, non-tender; bowel sounds normal; no masses,  no organomegaly and morbidly obese Extremities: edema trace to 1+ edema Pulses: 2+ and symmetric Skin: Skin color, texture, turgor normal. No rashes or lesions Neurologic: Mental status: Somnolent, arouses easily, follows commands  LABS: Results for orders placed or performed during the hospital encounter of 07/08/15 (from the past 48 hour(s))  Basic metabolic panel     Status: None   Collection Time: 07/08/15 11:34 AM  Result Value Ref Range   Sodium 141 135 - 145 mmol/L   Potassium 3.5 3.5 - 5.1 mmol/L   Chloride 104 101 - 111 mmol/L   CO2 28 22 - 32 mmol/L   Glucose, Bld 96 65 - 99 mg/dL   BUN 8 6 - 20 mg/dL   Creatinine, Ser 0.93 0.61 - 1.24 mg/dL   Calcium 8.9 8.9 - 10.3 mg/dL   GFR calc non Af Amer >60 >60 mL/min   GFR calc Af Amer >60 >60 mL/min    Comment: (NOTE) The eGFR has been calculated using the CKD EPI equation. This calculation has not been validated in all clinical situations. eGFR's persistently <60 mL/min signify possible Chronic Kidney Disease.    Anion gap 9 5 - 15  CBC     Status: Abnormal   Collection Time: 07/08/15  11:34 AM  Result Value Ref Range   WBC 5.9 4.0 - 10.5 K/uL   RBC 4.74 4.22 - 5.81 MIL/uL   Hemoglobin 9.9 (L) 13.0 - 17.0 g/dL   HCT 32.8 (L) 39.0 - 52.0 %   MCV 69.2 (L) 78.0 - 100.0 fL   MCH 20.9 (L) 26.0 - 34.0 pg   MCHC 30.2 30.0 - 36.0 g/dL   RDW 22.2 (H) 11.5 - 15.5 %   Platelets 161 150 - 400 K/uL  Protime-INR     Status: Abnormal  Collection Time: 07/08/15 11:34 AM  Result Value Ref Range   Prothrombin Time 21.8 (H) 11.6 - 15.2 seconds   INR 1.91 (H) 0.00 - 1.49  I-stat troponin, ED (not at [facility])     Status: None   Collection Time: 07/08/15 11:38 AM  Result Value Ref Range   Troponin i, poc 0.01 0.00 - 0.08 ng/mL   Comment 3            Comment: Due to the release kinetics of cTnI, a negative result within the first hours of the onset of symptoms does not rule out myocardial infarction with certainty. If myocardial infarction is still suspected, repeat the test at appropriate intervals.   D-dimer, quantitative (not at [facility])     Status: None   Collection Time: 07/08/15  3:58 PM  Result Value Ref Range   D-Dimer, Quant 0.32 0.00 - 0.50 ug/mL-FEU    Comment: (NOTE) At the manufacturer cut-off of 0.50 ug/mL FEU, this assay has been documented to exclude PE with a sensitivity and negative predictive value of 97 to 99%.  At this time, this assay has not been approved by the FDA to exclude DVT/VTE. Results should be correlated with clinical presentation.   Brain natriuretic peptide     Status: Abnormal   Collection Time: 07/08/15  4:01 PM  Result Value Ref Range   B Natriuretic Peptide 569.0 (H) 0.0 - 100.0 pg/mL  Magnesium     Status: None   Collection Time: 07/08/15 11:46 PM  Result Value Ref Range   Magnesium 1.9 1.7 - 2.4 mg/dL  Protime-INR     Status: Abnormal   Collection Time: 07/09/15  3:34 AM  Result Value Ref Range   Prothrombin Time 21.5 (H) 11.6 - 15.2 seconds   INR 1.88 (H) 0.00 - 1.49  Ferritin     Status: None   Collection Time: 07/09/15  3:48  AM  Result Value Ref Range   Ferritin 24 24 - 336 ng/mL  Iron and TIBC     Status: Abnormal   Collection Time: 07/09/15  3:48 AM  Result Value Ref Range   Iron 32 (L) 45 - 182 ug/dL   TIBC 435 250 - 450 ug/dL   Saturation Ratios 7 (L) 17.9 - 39.5 %   UIBC 403 ug/dL  Reticulocytes     Status: None   Collection Time: 07/09/15  3:48 AM  Result Value Ref Range   Retic Ct Pct 2.7 0.4 - 3.1 %   RBC. 4.82 4.22 - 5.81 MIL/uL   Retic Count, Manual 130.1 19.0 - 186.0 K/uL    IMAGING: Dg Chest 2 View  07/08/2015  CLINICAL DATA:  Cough and shortness of breath.  Left chest pain. EXAM: CHEST  2 VIEW COMPARISON:  02/10/2015 and 02/02/2009 FINDINGS: Heart size and pulmonary vascularity are normal. There is extensive scarring in the right hemi thorax including chronic fullness at the inferior aspect of the right hilum, blunting of the costophrenic angle, pleural thickening, and scarring in the right upper and lower lobes. Small area of atelectasis/ infiltrate in the right midzone seen on the prior study has resolved. The left lung is clear. No effusions. No acute osseous abnormality. IMPRESSION: No acute abnormality.  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 Electronically Signed   By: Lorriane Shire M.D.   On: 07/08/2015 12:42    HOSPITAL DIAGNOSES: Principal Problem:   Acute on chronic diastolic (congestive) heart failure (HCC) Active Problems:   Morbid obesity ([place])  Obstructive sleep apnea   Essential hypertension, benign   Permanent atrial fibrillation (HCC)   IMPRESSION: 1. Acute on chronic diastolic congestive heart failure 2. Untreated OSA 3. Persistent a-fib on warfarin (CHADSVASC score of 3)  RECOMMENDATION: Agree with continue IV lasix today. Likely to switch to po lasix tomorrow based on excellent response to diuretics. Very somnolent today, I suspect this is related to untreated OSA. Will order trial of inpatient CPAP - will need to be fitted with CPAP at home, based on  diagnosed OSA from recent sleep study. INR subtherapeutic - managed by pharmacy.  Time Spent Directly with Patient: 30 minutes  Pixie Casino, MD, [facility] Attending Cardiologist [place] 07/09/2015, 11:16 AM

## 2015-07-09 NOTE — Progress Notes (Signed)
Patient refused CPAP tonight, stating he has been fine without one while he has been here. RT made patient aware that if he changed his mind to call.

## 2015-07-09 NOTE — Progress Notes (Signed)
[facility]                                                                              Patient Demographics  Steven Barnett, is a 65 y.o. male, DOB - 05-Dec-1950, [id number]  Admit date - 07/08/2015   Admitting Physician Vianne Bulls, MD  Outpatient Primary MD for the patient is FULBRIGHT, VIRGINIA E, PA-C  LOS - 1   Chief Complaint  Patient presents with  . Shortness of Breath       Brief HPI   Steven Barnett is a 65 y.o. male with PMH of chronic atrial fibrillation on Coumadin, chronic hypertension, chronic diastolic CHF, obesity, and obstructive sleep apnea who had presented to his cardiology clinic earlier in the day for INR check and was directed to the ED for dyspnea at rest for last 2-3 weeks. He also reported orthopnea and lower extremity edema. Patient reported compliance with Lasix. In ED, found to be hypoxic with O2 sats 80's on room air, tachypnea RR 30's. EKG showed A. fib BNP 569, microcytic anemia, patient was given Lasix 80 mg IV 1 and admitted for further workup.    Assessment & Plan    Principal Problem:  Acute on chronic diastolic CHF  - Continue IV Lasix, cardiology consulted, appreciate recommendations.  - Patient will need inpatient CPAP, had a recent diagnosis of OSA from sleep study. - 2-D echo showed EF of 55-60%, no regional wall motion abnormalities.  - Cont ACEi, holding beta-blocker given bradycardia and acute failure - D-dimer 0.32   Chronic Atrial fib  - On coumadin, subtherapeutic at 1.9 - continue Coumadin per pharmacy  - CHADS VASC 3   Hypertension - BP  stable, continue lisinopril, diltiazem, hydralazine  - Holding metoprolol for bradycardia and acute heart failure  - Resume beta-blocker as appropriate   OSA  - CPAP qHS   Iron deficiency anemia, microcytic anemia - No reports of GI bleeding,  follow stool occult test, H&H in a.m.  - outpatient GI workup Unless FOBT positive or worsening anemia   Code  status:  FC   Family Communication: Discussed in detail with the patient, all imaging results, lab results explained to the patient    Disposition Plan:   Time Spent in minutes   minutes  Procedures  echo  Consults   cardiology  DVT Prophylaxis  warfarin  Medications  Scheduled Meds: . aspirin  81 mg Oral Daily  . diltiazem  120 mg Oral Daily  . furosemide  40 mg Intravenous BID  . hydrALAZINE  50 mg Oral BID  . lisinopril  20 mg Oral Daily  . potassium chloride  30 mEq Oral BID  . sodium chloride  3 mL Intravenous Q12H  . warfarin  10 mg Oral ONCE-1800  . Warfarin - Pharmacist Dosing Inpatient   Does not apply q1800   Continuous Infusions:  PRN Meds:.sodium chloride, acetaminophen, ondansetron (ZOFRAN) IV, sodium chloride   Antibiotics   Anti-infectives    None        Subjective:   Steven Barnett was seen and examined today. still having some shortness of breath with exertion but  no fevers or chills or chest pain. Patient denies dizziness, chest pain,  abdominal pain, N/V/D/C, new weakness, numbess, tingling. No acute events overnight.    Objective:   Blood pressure 120/70, pulse 101, temperature 98.1 F (36.7 C), temperature source Oral, resp. rate 18, height 5\' 10"  (1.778 m), weight 119.704 kg (263 lb 14.4 oz), SpO2 97 %.  Wt Readings from Last 3 Encounters:  07/09/15 119.704 kg (263 lb 14.4 oz)  07/08/15 126.1 kg (278 lb)  04/01/15 134.265 kg (296 lb)     Intake/Output Summary (Last 24 hours) at 07/09/15 1355 Last data filed at 07/09/15 1334  Gross per 24 hour  Intake   1357 ml  Output   7075 ml  Net  -5718 ml    Exam  General: Alert and oriented x 3, NAD  HEENT:  PERRLA, EOMI, Anicteric Sclera, mucous membranes moist.   Neck: Supple, no JVD, no masses  CVS: ireg ireg   Respiratory: Decreased breath sounds at the bases   Abdomen: Soft, nontender, nondistended, + bowel sounds  Ext: no cyanosis clubbing, 1+  edema  Neuro: AAOx3, Cr  N's II- XII. Strength 5/5 upper and lower extremities bilaterally  Skin: No rashes  Psych: Normal affect and demeanor, alert and oriented x3    Data Review   Micro Results No results found for this or any previous visit (from the past 240 hour(s)).  Radiology Reports Dg Chest 2 View  07/08/2015  CLINICAL DATA:  Cough and shortness of breath.  Left chest pain. EXAM: CHEST  2 VIEW COMPARISON:  02/10/2015 and 02/02/2009 FINDINGS: Heart size and pulmonary vascularity are normal. There is extensive scarring in the right hemi thorax including chronic fullness at the inferior aspect of the right hilum, blunting of the costophrenic angle, pleural thickening, and scarring in the right upper and lower lobes. Small area of atelectasis/ infiltrate in the right midzone seen on the prior study has resolved. The left lung is clear. No effusions. No acute osseous abnormality. IMPRESSION: No acute abnormality.  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 Electronically Signed   By: Lorriane Shire M.D.   On: 07/08/2015 12:42    CBC  Recent Labs Lab 07/08/15 1134  WBC 5.9  HGB 9.9*  HCT 32.8*  PLT 161  MCV 69.2*  MCH 20.9*  MCHC 30.2  RDW 22.2*    Chemistries   Recent Labs Lab 07/08/15 1134 07/08/15 2346  NA 141  --   K 3.5  --   CL 104  --   CO2 28  --   GLUCOSE 96  --   BUN 8  --   CREATININE 0.93  --   CALCIUM 8.9  --   MG  --  1.9   ------------------------------------------------------------------------------------------------------------------ estimated creatinine clearance is 105.4 mL/min (by C-G formula based on Cr of 0.93). ------------------------------------------------------------------------------------------------------------------ No results for input(s): HGBA1C in the last 72 hours. ------------------------------------------------------------------------------------------------------------------ No results for input(s): CHOL, HDL, LDLCALC, TRIG, CHOLHDL, LDLDIRECT in  the last 72 hours. ------------------------------------------------------------------------------------------------------------------ No results for input(s): TSH, T4TOTAL, T3FREE, THYROIDAB in the last 72 hours.  Invalid input(s): FREET3 ------------------------------------------------------------------------------------------------------------------  Recent Labs  07/09/15 0348  FERRITIN 24  TIBC 435  IRON 32*  RETICCTPCT 2.7    Coagulation profile  Recent Labs Lab 07/08/15 0933 07/08/15 1134 07/09/15 0334  INR 2 1.91* 1.88*     Recent Labs  07/08/15 1558  DDIMER 0.32    Cardiac Enzymes No results for input(s): CKMB, TROPONINI, MYOGLOBIN in the last 168  hours.  Invalid input(s): CK ------------------------------------------------------------------------------------------------------------------ Invalid input(s): POCBNP  No results for input(s): GLUCAP in the last 72 hours.   Steven Barnett M.D. [facility] 07/09/2015, 1:55 PM  Pager: [phone] Between 7am to 7pm - call Pager - [phone]  After 7pm go to [email or web address] - password [email or web address]  Call night coverage person covering after 7pm

## 2015-07-09 NOTE — Progress Notes (Signed)
ANTICOAGULATION CONSULT NOTE Pharmacy Consult for Coumadin Indication: atrial fibrillation  No Known Allergies  Patient Measurements: Height: 5\' 10"  (177.8 cm) Weight: 263 lb 14.4 oz (119.704 kg) IBW/kg (Calculated) : 73  Vital Signs: Temp: 99.7 F (37.6 C) (01/05 0819) Temp Source: Oral (01/05 0819) BP: 142/76 mmHg (01/05 1104) Pulse Rate: 95 (01/05 1104)  Labs:  Recent Labs  07/08/15 0933 07/08/15 1134 07/09/15 0334  HGB  --  9.9*  --   HCT  --  32.8*  --   PLT  --  161  --   LABPROT  --  21.8* 21.5*  INR 2 1.91* 1.88*  CREATININE  --  0.93  --     Estimated Creatinine Clearance: 105.4 mL/min (by C-G formula based on Cr of 0.93).   Assessment: 65 yo M presents on 1/4 with SOB. On coumadin 8mg  daily exc 6mg  on Monday. Last dose was 8mg  and taken this am. PTA INR on admit is 1.91. Hgb 9.9, plts wnl. No s/s of bleed.  INR today = 1.88  Goal of Therapy:  INR 2-3 Monitor platelets by anticoagulation protocol: Yes   Plan:  Coumadin 10 mg po x 1 tonight Monitor daily INR, CBC, s/s of bleed  Thank you Anette Guarneri, PharmD [phone]  07/09/2015 11:29 AM

## 2015-07-10 DIAGNOSIS — I509 Heart failure, unspecified: Secondary | ICD-10-CM

## 2015-07-10 LAB — CBC
HEMATOCRIT: 38.1 % — AB (ref 39.0–52.0)
Hemoglobin: 11.6 g/dL — ABNORMAL LOW (ref 13.0–17.0)
MCH: 20.8 pg — ABNORMAL LOW (ref 26.0–34.0)
MCHC: 30.4 g/dL (ref 30.0–36.0)
MCV: 68.3 fL — ABNORMAL LOW (ref 78.0–100.0)
Platelets: 189 10*3/uL (ref 150–400)
RBC: 5.58 MIL/uL (ref 4.22–5.81)
RDW: 21.9 % — AB (ref 11.5–15.5)
WBC: 6.3 10*3/uL (ref 4.0–10.5)

## 2015-07-10 LAB — BASIC METABOLIC PANEL
Anion gap: 11 (ref 5–15)
BUN: 14 mg/dL (ref 6–20)
CALCIUM: 8.7 mg/dL — AB (ref 8.9–10.3)
CO2: 24 mmol/L (ref 22–32)
CREATININE: 1.02 mg/dL (ref 0.61–1.24)
Chloride: 101 mmol/L (ref 101–111)
GFR calc non Af Amer: >60 mL/min (ref >60)
GLUCOSE: 85 mg/dL (ref 65–99)
Potassium: 3.7 mmol/L (ref 3.5–5.1)
Sodium: 136 mmol/L (ref 135–145)

## 2015-07-10 LAB — PROTIME-INR
INR: 1.59 — AB (ref 0.00–1.49)
Prothrombin Time: 19 seconds — ABNORMAL HIGH (ref 11.6–15.2)

## 2015-07-10 MED ORDER — POLYETHYLENE GLYCOL 3350 17 G PO PACK
17.0000 g | PACK | Freq: Once | ORAL | Status: AC
  Administered 2015-07-10: 17 g via ORAL
  Filled 2015-07-10: qty 1

## 2015-07-10 MED ORDER — METOPROLOL TARTRATE 1 MG/ML IV SOLN
2.5000 mg | Freq: Once | INTRAVENOUS | Status: AC
  Administered 2015-07-10: 2.5 mg via INTRAVENOUS
  Filled 2015-07-10: qty 5

## 2015-07-10 MED ORDER — METOPROLOL TARTRATE 50 MG PO TABS
50.0000 mg | ORAL_TABLET | Freq: Two times a day (BID) | ORAL | Status: DC
  Administered 2015-07-10 – 2015-07-12 (×4): 50 mg via ORAL
  Filled 2015-07-10 (×4): qty 1

## 2015-07-10 MED ORDER — DOCUSATE SODIUM 100 MG PO CAPS
100.0000 mg | ORAL_CAPSULE | Freq: Two times a day (BID) | ORAL | Status: DC
  Administered 2015-07-10 – 2015-07-12 (×5): 100 mg via ORAL
  Filled 2015-07-10 (×5): qty 1

## 2015-07-10 MED ORDER — WARFARIN SODIUM 10 MG PO TABS
10.0000 mg | ORAL_TABLET | Freq: Once | ORAL | Status: AC
  Administered 2015-07-10: 10 mg via ORAL
  Filled 2015-07-10: qty 1

## 2015-07-10 NOTE — Progress Notes (Signed)
Patient Profile: 65 y.o. male with a history significant for hypertension, sleep disorder consistent with obstructive sleep apnea, recent diagnosis of diastolic heart failure with moderate LVH and recent diagnosis of postoperative atrial fibrillation following a thoarcotomy (continues to be in atrial fibrillation) admitted for acute on chronic diastolic CHF.   Subjective: He feels better at rest after diuresis but continues to feel poorly with ambulation with dyspnea and palpitations. He did not sleep with CPAP last night as he could not tolerate the face mask.  Objective: Vital signs in last 24 hours: Temp:  [98 F (36.7 C)-98.1 F (36.7 C)] 98 F (36.7 C) (01/06 0354) Pulse Rate:  [72-101] 72 (01/06 0354) Resp:  [18] 18 (01/06 0354) BP: (120-142)/(60-76) 132/76 mmHg (01/06 0354) SpO2:  [93 %-97 %] 97 % (01/06 0354) Weight:  [264 lb 1.8 oz (119.8 kg)] 264 lb 1.8 oz (119.8 kg) (01/06 0356) Last BM Date: 07/07/15  Intake/Output from previous day: 01/05 0701 - 01/06 0700 In: 1699 [P.O.:1699] Out: 1675 [Urine:1675] Intake/Output this shift:    Medications Current Facility-Administered Medications  Medication Dose Route Frequency Provider Last Rate Last Dose  . 0.9 %  sodium chloride infusion  250 mL Intravenous PRN Vianne Bulls, MD      . acetaminophen (TYLENOL) tablet 650 mg  650 mg Oral Q4H PRN Vianne Bulls, MD   650 mg at 07/09/15 2118  . aspirin chewable tablet 81 mg  81 mg Oral Daily Vianne Bulls, MD   81 mg at 07/09/15 1107  . docusate sodium (COLACE) capsule 100 mg  100 mg Oral BID Ripudeep K Rai, MD      . furosemide (LASIX) injection 40 mg  40 mg Intravenous BID Vianne Bulls, MD   40 mg at 07/10/15 0824  . hydrALAZINE (APRESOLINE) tablet 50 mg  50 mg Oral BID Vianne Bulls, MD   50 mg at 07/09/15 2117  . lisinopril (PRINIVIL,ZESTRIL) tablet 20 mg  20 mg Oral Daily Vianne Bulls, MD   20 mg at 07/09/15 1107  . ondansetron (ZOFRAN) injection 4 mg  4 mg  Intravenous Q6H PRN Ilene Qua Opyd, MD      . polyethylene glycol (MIRALAX / GLYCOLAX) packet 17 g  17 g Oral Once Ripudeep K Rai, MD      . potassium chloride (K-DUR,KLOR-CON) CR tablet 30 mEq  30 mEq Oral BID Vianne Bulls, MD   30 mEq at 07/09/15 2117  . sodium chloride 0.9 % injection 3 mL  3 mL Intravenous Q12H Ilene Qua Opyd, MD   3 mL at 07/09/15 2200  . sodium chloride 0.9 % injection 3 mL  3 mL Intravenous PRN Vianne Bulls, MD      . Warfarin - Pharmacist Dosing Inpatient   Does not apply q1800 Reginia Naas, RPH   1 each at 07/09/15 1713    PE: General appearance: alert, cooperative and no distress Neck: no carotid bruit and no JVD Lungs: clear to auscultation bilaterally Heart: irregularly irregular rhythm and tachy rate Extremities: no LEE Pulses: 2+ and symmetric Skin: warm and dry Neurologic: Grossly normal  Lab Results:   Recent Labs  07/08/15 1134 07/10/15 0305  WBC 5.9 6.3  HGB 9.9* 11.6*  HCT 32.8* 38.1*  PLT 161 189   BMET  Recent Labs  07/08/15 1134 07/10/15 0305  NA 141 136  K 3.5 3.7  CL 104 101  CO2 28 24  GLUCOSE 96 85  BUN  8 14  CREATININE 0.93 1.02  CALCIUM 8.9 8.7*   PT/INR  Recent Labs  07/08/15 1134 07/09/15 0334 07/10/15 0305  LABPROT 21.8* 21.5* 19.0*  INR 1.91* 1.88* 1.59*    Assessment/Plan  Principal Problem:   Acute on chronic diastolic (congestive) heart failure (HCC) Active Problems:   Morbid obesity ([place])   Obstructive sleep apnea   Essential hypertension, benign   Permanent atrial fibrillation ([place])  1. Acute on Chronic Diastolic CHF: good diuresis with IV lasix. -1.6L out yesterday. Net I/Os negative 5.8L since admit. Renal function and K are both stable. SCr 1.02 and K 3.7. Volume appears stable. Can transition to PO lasix today.   2. Permanent Atrial Fibrillation: rate is poorly controlled, in the 120s resting. He is symptomatic when he ambulates. He was on 50 mg of metoprolol BID and 120 mg Cardizem  as an outpatient but this was held on admission. There was ? Of bradycardia on admit. His BP is stable. Will restart metoprolol for rate control to see how he responds. Can add back Cardizem later if he has no issues with bradycardia. He is on coumadin for a/c. INR is subtherapeutic at 1.59. Continue dosing per pharmacy.   3. OSA: noncomplaint with CPAP. Patient tried sleeping with CPAP last night but could not tolerate face mask.    LOS: 2 days    Jersee Winiarski M. Ladoris Gene 07/10/2015 8:33 AM

## 2015-07-10 NOTE — Progress Notes (Signed)
Heart Failure Navigator Consult Note  Presentation: Steven Barnett is a 65 y.o. male with PMH of chronic atrial fibrillation on Coumadin, chronic hypertension, chronic diastolic CHF, obesity, and obstructive sleep apnea who had presented to his cardiology clinic earlier in the day for INR check and was directed to the ED for dyspnea at rest. He reports progressively worsening dyspnea over the last 2-3 weeks. He denies any fevers, chills, or productive cough over this interval, but does endorse orthopnea and lower extremity edema. Respiratory symptoms have progressed and are now present at rest. Patient denies chest pains or palpitations. Patient has been taking Lasix 40 mg twice daily, reporting compliance and denying any recent change in diet, fluid intake, or medications. He has not noticed any change in urine output.  Past Medical History  Diagnosis Date  . Hypertension   . Permanent atrial fibrillation ([place]) 2010  . Chronic anticoagulation 2010    Coumadin  . OSA (obstructive sleep apnea) 2016    Not on CPAP  . Shortness of breath dyspnea   . CHF (congestive heart failure) ([place])     Social History   Social History  . Marital Status: Married    Spouse Name: N/A  . Number of Children: N/A  . Years of Education: N/A   Social History Main Topics  . Smoking status: Former Smoker    Quit date: 12/16/1974  . Smokeless tobacco: Never Used  . Alcohol Use: No  . Drug Use: No     Comment: Hx polysubstance abuse, quit 2008  . Sexual Activity: Not Asked   Other Topics Concern  . None   Social History Narrative    ECHO:[facility]--07/09/15  - Left ventricle: The cavity size was normal. Wall thickness was increased in a pattern of moderate LVH. Systolic function was normal. The estimated ejection fraction was in the range of 55% to 60%. Wall motion was normal; there were no regional wall motion abnormalities. The study is not technically sufficient to allow evaluation  of LV diastolic function. - Aortic valve: Trileaflet. Sclerosis without stenosis. There was no regurgitation. - Mitral valve: There was mild regurgitation. - Left atrium: Moderately dilated at 45 ml/m2. - Right ventricle: The cavity size was mildly dilated. - Right atrium: Moderate to severely dilated. - Inferior vena cava: The vessel was normal in size. The respirophasic diameter changes were in the normal range (>= 50%), consistent with normal central venous pressure.  Impressions:  - Compared to the prior echo in 2010, LVEF remains unchanged.  Transthoracic echocardiography. M-mode, complete 2D, spectral Doppler, and color Doppler. Birthdate: Patient birthdate: January 29, 1951. Age: Patient is 65 yr old. Sex: Gender: male. BMI: 37.7 kg/m^2. Blood pressure:   154/78 Patient status: Inpatient. Study date: Study date: 07/09/2015. Study time: 10:02 AM. Location: Echo laboratory.  BNP    Component Value Date/Time   BNP 569.0* 07/08/2015 1601    ProBNP    Component Value Date/Time   PROBNP 196.0* 01/07/2009 1310     Education Assessment and Provision:  Detailed education and instructions provided on heart failure disease management including the following:  Signs and symptoms of Heart Failure When to call the physician Importance of daily weights Low sodium diet Fluid restriction Medication management Anticipated future follow-up appointments  Patient education given on each of the above topics.  Patient acknowledges understanding and acceptance of all instructions.  I spoke with Steven Barnett regarding his HF.  He was quite lethargic during our conversation and nurse tells me that he refused CPAP  last night.  I discussed him using the CPAP and heart risk with sleep apnea.  He seems much more amendable to at least trying it tonight.  He tells me that he does not have a scale and has not been weighing each day.  I will provide him a scale for home use and have  reinforced the importance of daily weights/when to contact the physician.  I reviewed a low sodium diet and high sodium foods to avoid.  He denies any issues with getting or taking prescribed medications.  He lives in [place] with his wife and follows with Dr[facility] at [facility].   Education Materials:  "Living Better With Heart Failure" Booklet, Daily Weight Tracker Tool    High Risk Criteria for Readmission and/or Poor Patient Outcomes:   EF <30%- No 55-60%  2 or more admissions in 6 months- No  Difficult social situation- No lives in [place] with wife  Demonstrates medication noncompliance- No denies   Barriers of Care:  Knowledge and compliance  Discharge Planning:   Plans to return to home with his wife.

## 2015-07-10 NOTE — Progress Notes (Signed)
Since getting OOB, patient heart rate has sustained 120-130's. MD on call notified, awaiting new orders. Will continue to monitor. Tresa Endo

## 2015-07-10 NOTE — Progress Notes (Signed)
[facility] for Coumadin Indication: atrial fibrillation  No Known Allergies  Patient Measurements: Height: 5\' 10"  (177.8 cm) Weight: 264 lb 1.8 oz (119.8 kg) (scale c) IBW/kg (Calculated) : 73  Vital Signs: Temp: 98 F (36.7 C) (01/06 0354) Temp Source: Oral (01/06 0354) BP: 103/70 mmHg (01/06 0955) Pulse Rate: 70 (01/06 0955)  Labs:  Recent Labs  07/08/15 1134 07/09/15 0334 07/10/15 0305  HGB 9.9*  --  11.6*  HCT 32.8*  --  38.1*  PLT 161  --  189  LABPROT 21.8* 21.5* 19.0*  INR 1.91* 1.88* 1.59*  CREATININE 0.93  --  1.02    Estimated Creatinine Clearance: 96.1 mL/min (by C-G formula based on Cr of 1.02).   Assessment: 65 yo M presents on 1/4 with SOB. On coumadin 8mg  daily exc 6mg  on Monday. Last dose was 8mg  and taken this am. PTA INR on admit is 1.91. Hgb 9.9, plts wnl. No s/s of bleed.  INR today = 1.59   Goal of Therapy:  INR 2-3 Monitor platelets by anticoagulation protocol: Yes   Plan:  Coumadin 10 mg po x 1 tonight Monitor daily INR, CBC, s/s of bleed  Thank you Anette Guarneri, PharmD [phone]  07/10/2015 10:41 AM

## 2015-07-10 NOTE — Progress Notes (Signed)
Pt placed on automode CPAP.  Pt is tolerating well and resting comfortably.

## 2015-07-10 NOTE — Progress Notes (Signed)
[facility]                                                                              Patient Demographics  Steven Barnett, is a 65 y.o. male, DOB - August 04, 1950, [id number]  Admit date - 07/08/2015   Admitting Physician Vianne Bulls, MD  Outpatient Primary MD for the patient is FULBRIGHT, VIR[place]IA E, PA-C  LOS - 2   Chief Complaint  Patient presents with  . Shortness of Breath       Brief HPI   Steven Barnett is a 65 y.o. male with PMH of chronic atrial fibrillation on Coumadin, chronic hypertension, chronic diastolic CHF, obesity, and obstructive sleep apnea who had presented to his cardiology clinic earlier in the day for INR check and was directed to the ED for dyspnea at rest for last 2-3 weeks. He also reported orthopnea and lower extremity edema. Patient reported compliance with Lasix. In ED, found to be hypoxic with O2 sats 80's on room air, tachypnea RR 30's. EKG showed A. fib BNP 569, microcytic anemia, patient was given Lasix 80 mg IV 1 and admitted for further workup.    Assessment & Plan    Principal Problem:  Acute on chronic diastolic CHF  - Cardiology consulted, on IV diuresis, Negative balance of 5.8 L - Cardiology recommending transition to oral Lasix today - Patient was not able to tolerate facemask for CPAP, will need fitting outpatient. He had a recent diagnosis of OSA from sleep study. - 2-D echo showed EF of 55-60%, no regional wall motion abnormalities.  - Cont ACEi, holding beta-blocker given bradycardia and acute failure - D-dimer 0.32   Chronic Atrial fib  - On coumadin, subtherapeutic at 1.9 - continue Coumadin per pharmacy  - CHADS VASC 3 - Per cardiology, rate is poorly controlled and recommended adding Cardizem and metoprolol   Hypertension - BP  stable, continue lisinopril, diltiazem, hydralazine  - Cardiology recommending adding metoprolol.  OSA  - CPAP qHS   Iron deficiency anemia, microcytic anemia - No  reports of GI bleeding,  follow stool occult test -H&H stable and improving  - outpatient GI workup Unless FOBT positive or worsening anemia   Code status:  FC   Family Communication: Discussed in detail with the patient, all imaging results, lab results explained to the patient    Disposition Plan:   Time Spent in minutes  25 minutes  Procedures  echo  Consults   cardiology  DVT Prophylaxis  warfarin  Medications  Scheduled Meds: . aspirin  81 mg Oral Daily  . docusate sodium  100 mg Oral BID  . furosemide  40 mg Intravenous BID  . hydrALAZINE  50 mg Oral BID  . lisinopril  20 mg Oral Daily  . metoprolol  50 mg Oral BID  . potassium chloride  30 mEq Oral BID  . sodium chloride  3 mL Intravenous Q12H  . warfarin  10 mg Oral ONCE-1800  . Warfarin - Pharmacist Dosing Inpatient   Does not apply q1800   Continuous Infusions:  PRN Meds:.sodium chloride, acetaminophen, ondansetron (ZOFRAN) IV, sodium chloride   Antibiotics  Anti-infectives    None        Subjective:   Kiing Grotte was seen and examined today. No significant complaints, shortness of breath is improving. No fevers or chills or chest pain. Patient denies dizziness, chest pain,  abdominal pain, N/V/D/C, new weakness, numbess, tingling. No acute events overnight.    Objective:   Blood pressure 103/70, pulse 70, temperature 98 F (36.7 C), temperature source Oral, resp. rate 18, height 5\' 10"  (1.778 m), weight 119.8 kg (264 lb 1.8 oz), SpO2 97 %.  Wt Readings from Last 3 Encounters:  07/10/15 119.8 kg (264 lb 1.8 oz)  07/08/15 126.1 kg (278 lb)  04/01/15 134.265 kg (296 lb)     Intake/Output Summary (Last 24 hours) at 07/10/15 1213 Last data filed at 07/10/15 0955  Gross per 24 hour  Intake   1459 ml  Output   1125 ml  Net    334 ml    Exam  General: Alert and oriented x 3, NAD  HEENT:  PERRLA, EOMI  Neck: Supple, no JVD, no masses  CVS: ireg ireg   Respiratory: Decreased breath  sounds at the bases   Abdomen: Soft, nontender, nondistended, + bowel sounds  Ext: no cyanosis clubbing, 1+  edema  Neuro:no new deficits  Skin: No rashes  Psych: Normal affect and demeanor, alert and oriented x3    Data Review   Micro Results No results found for this or any previous visit (from the past 240 hour(s)).  Radiology Reports Dg Chest 2 View  07/08/2015  CLINICAL DATA:  Cough and shortness of breath.  Left chest pain. EXAM: CHEST  2 VIEW COMPARISON:  02/10/2015 and 02/02/2009 FINDINGS: Heart size and pulmonary vascularity are normal. There is extensive scarring in the right hemi thorax including chronic fullness at the inferior aspect of the right hilum, blunting of the costophrenic angle, pleural thickening, and scarring in the right upper and lower lobes. Small area of atelectasis/ infiltrate in the right midzone seen on the prior study has resolved. The left lung is clear. No effusions. No acute osseous abnormality. IMPRESSION: No acute abnormality.  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 Electronically Signed   By: Lorriane Shire M.D.   On: 07/08/2015 12:42    CBC  Recent Labs Lab 07/08/15 1134 07/10/15 0305  WBC 5.9 6.3  HGB 9.9* 11.6*  HCT 32.8* 38.1*  PLT 161 189  MCV 69.2* 68.3*  MCH 20.9* 20.8*  MCHC 30.2 30.4  RDW 22.2* 21.9*    Chemistries   Recent Labs Lab 07/08/15 1134 07/08/15 2346 07/10/15 0305  NA 141  --  136  K 3.5  --  3.7  CL 104  --  101  CO2 28  --  24  GLUCOSE 96  --  85  BUN 8  --  14  CREATININE 0.93  --  1.02  CALCIUM 8.9  --  8.7*  MG  --  1.9  --    ------------------------------------------------------------------------------------------------------------------ estimated creatinine clearance is 96.1 mL/min (by C-G formula based on Cr of 1.02). ------------------------------------------------------------------------------------------------------------------ No results for input(s): HGBA1C in the last 72  hours. ------------------------------------------------------------------------------------------------------------------ No results for input(s): CHOL, HDL, LDLCALC, TRIG, CHOLHDL, LDLDIRECT in the last 72 hours. ------------------------------------------------------------------------------------------------------------------ No results for input(s): TSH, T4TOTAL, T3FREE, THYROIDAB in the last 72 hours.  Invalid input(s): FREET3 ------------------------------------------------------------------------------------------------------------------  Recent Labs  07/09/15 0348  FERRITIN 24  TIBC 435  IRON 32*  RETICCTPCT 2.7    Coagulation profile  Recent Labs Lab  07/08/15 0933 07/08/15 1134 07/09/15 0334 07/10/15 0305  INR 2 1.91* 1.88* 1.59*     Recent Labs  07/08/15 1558  DDIMER 0.32    Cardiac Enzymes No results for input(s): CKMB, TROPONINI, MYOGLOBIN in the last 168 hours.  Invalid input(s): CK ------------------------------------------------------------------------------------------------------------------ Invalid input(s): POCBNP  No results for input(s): GLUCAP in the last 72 hours.   Chike Farrington M.D. [facility] 07/10/2015, 12:13 PM  Pager: [phone] Between 7am to 7pm - call Pager - [phone]  After 7pm go to [email or web address] - password [email or web address]  Call night coverage person covering after 7pm

## 2015-07-10 NOTE — Progress Notes (Signed)
Patient heart rate up to 130's while OOB urinating. Patient asymptomatic. Will continue to monitor. Tresa Endo

## 2015-07-10 NOTE — Care Management Note (Signed)
Case Management Note  Patient Details  Name: Steven Barnett MRN: [id number] Date of Birth: 1950/07/31  Subjective/Objective:       Admitted with Acute CHF             Action/Plan: Patient lives at home with spouse, very little exercise, patient states that he is frequently out of breath, Eats a heart health diet the majority of the time but consumed food over the holiday that he normally does not eat. Has Medicaid insurance/ prescription drug coverage - no problem getting his medication. Patient could benefit from a Disease Management Program for CHF, choice offered, patient chose [facility] Mary with [facility] called for arrangements. Does not have any DME at this time at home.  Expected Discharge Date:  Possibly 07/11/2015           Expected Discharge Plan:  [place]  Discharge planning Services  CM Consult  Choice offered to:  Patient  HH Arranged:  RN, Disease Management, PT [place] Agency:  [facility]  Status of Service:  In process, will continue to follow  Sherrilyn Rist [phone] 07/10/2015, 1:27 PM

## 2015-07-11 LAB — CBC
HEMATOCRIT: 36.2 % — AB (ref 39.0–52.0)
HEMOGLOBIN: 10.8 g/dL — AB (ref 13.0–17.0)
MCH: 20.1 pg — ABNORMAL LOW (ref 26.0–34.0)
MCHC: 29.8 g/dL — ABNORMAL LOW (ref 30.0–36.0)
MCV: 67.5 fL — AB (ref 78.0–100.0)
Platelets: 213 10*3/uL (ref 150–400)
RBC: 5.36 MIL/uL (ref 4.22–5.81)
RDW: 22 % — ABNORMAL HIGH (ref 11.5–15.5)
WBC: 5.6 10*3/uL (ref 4.0–10.5)

## 2015-07-11 LAB — BASIC METABOLIC PANEL
ANION GAP: 8 (ref 5–15)
BUN: 13 mg/dL (ref 6–20)
CALCIUM: 8.5 mg/dL — AB (ref 8.9–10.3)
CHLORIDE: 102 mmol/L (ref 101–111)
CO2: 26 mmol/L (ref 22–32)
Creatinine, Ser: 0.92 mg/dL (ref 0.61–1.24)
GFR calc non Af Amer: >60 mL/min (ref >60)
GLUCOSE: 80 mg/dL (ref 65–99)
POTASSIUM: 4.1 mmol/L (ref 3.5–5.1)
Sodium: 136 mmol/L (ref 135–145)

## 2015-07-11 LAB — PROTIME-INR
INR: 1.64 — AB (ref 0.00–1.49)
PROTHROMBIN TIME: 19.5 s — AB (ref 11.6–15.2)

## 2015-07-11 MED ORDER — WARFARIN SODIUM 5 MG PO TABS
8.0000 mg | ORAL_TABLET | Freq: Once | ORAL | Status: AC
  Administered 2015-07-11: 8 mg via ORAL
  Filled 2015-07-11: qty 1

## 2015-07-11 NOTE — Progress Notes (Signed)
ANTICOAGULATION CONSULT NOTE Pharmacy Consult for Coumadin Indication: atrial fibrillation  No Known Allergies  Patient Measurements: Height: 5\' 10"  (177.8 cm) Weight: 264 lb 4.8 oz (119.886 kg) IBW/kg (Calculated) : 73  Vital Signs: Temp: 98.3 F (36.8 C) (01/07 0415) Temp Source: Oral (01/07 0415) BP: 124/80 mmHg (01/07 0415) Pulse Rate: 79 (01/07 0415)  Labs:  Recent Labs  07/08/15 1134 07/09/15 0334 07/10/15 0305 07/11/15 0332  HGB 9.9*  --  11.6* 10.8*  HCT 32.8*  --  38.1* 36.2*  PLT 161  --  189 213  LABPROT 21.8* 21.5* 19.0* 19.5*  INR 1.91* 1.88* 1.59* 1.64*  CREATININE 0.93  --  1.02 0.92    Estimated Creatinine Clearance: 106.7 mL/min (by C-G formula based on Cr of 0.92).   Assessment: 65 yo M presents on 1/4 with SOB. On coumadin pta for afib.  PTA dose: 8mg  daily exc 6mg  on Monday. INR on admit 1.91.    Received 10mg  doses on 1/5 and 1/6.   INR today = 1.64, CBC stable, no bleeding noted   Goal of Therapy:  INR 2-3 Monitor platelets by anticoagulation protocol: Yes   Plan:  Coumadin 8 mg po x 1 tonight Monitor daily INR, CBC, s/s of bleed  Arieona Swaggerty C. Lennox Grumbles, PharmD Pharmacy Resident  Pager: [phone] 07/11/2015 10:27 AM

## 2015-07-11 NOTE — Evaluation (Signed)
Physical Therapy Evaluation Patient Details Name: Steven Barnett MRN: [id number] DOB: 1950-12-06 Today's Date: 07/11/2015   History of Present Illness  pt admitted with acute on chonic CHF He has been undergoing diuresis while in hospital and states he feels much better  He states he usually takdes tylenol all the time for pain in his knees and feet and can tell he hasn't had it lately   Clinical Impression  Pt sleeping upon arrival and needed some encouragement for activity, but once he was fully awake, he did well with moblity and gait.  He c/o knee arthritis but is not interested in doing any exercises for legs.  He will not need PT follow up in hospital or at discharge. Recommend nursing encourage mobility and walking in hallway when his knee pain is controlled.     Follow Up Recommendations No PT follow up    Equipment Recommendations  None recommended by PT    Recommendations for Other Services       Precautions / Restrictions Precautions Precautions: None Restrictions Weight Bearing Restrictions: No      Mobility  Bed Mobility Overal bed mobility: Independent                Transfers Overall transfer level: Independent               General transfer comment: pt needs extra encouragement and appears unsteady at first, but is able to regain balance   Ambulation/Gait Ambulation/Gait assistance: Independent Ambulation Distance (Feet): 50 Feet Assistive device: None Gait Pattern/deviations: Antalgic;Wide base of support Gait velocity: slow   General Gait Details: pt reports stiffness and pain in legs with weight bearing   Stairs            Wheelchair Mobility    Modified Rankin (Stroke Patients Only)       Balance                                             Pertinent Vitals/Pain Pain Location: pt reports pain in knees and feet after ambulation He deferred a second walk due to pain Pain Descriptors / Indicators:  Aching Pain Intervention(s): Patient requesting pain meds-RN notified    Home Living Family/patient expects to be discharged to:: Private residence Living Arrangements: Spouse/significant other   Type of Home: Apartment Home Access: Stairs to enter       Home Equipment: None      Prior Function Level of Independence: Independent         Comments: 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  He is not interested in learning an exercie program      Hand Dominance        Extremity/Trunk Assessment               Lower Extremity Assessment: Overall WFL for tasks assessed (pt c/o pain in knees "arthritis" with genu varus evident)         Communication   Communication: No difficulties  Cognition Arousal/Alertness: Awake/alert;Lethargic (needed extra time to wake up ) Behavior During Therapy: [facility] for tasks assessed/performed;Agitated (needs extra encouragement to participate with PT ) Overall Cognitive Status: Within Functional Limits for tasks assessed                      General Comments General comments (skin integrity, edema, etc.): no edema easily observable  in legs, pt feels he may still have abdominal swelling, but is overall happy with his improvement    Exercises        Assessment/Plan    PT Assessment Patent does not need any further PT services  PT Diagnosis Generalized weakness   PT Problem List    PT Treatment Interventions     PT Goals (Current goals can be found in the Care Plan section)      Frequency     Barriers to discharge        Co-evaluation               End of Session Equipment Utilized During Treatment: Gait belt Activity Tolerance: Patient tolerated treatment well Patient left: in bed;with call bell/phone within reach Nurse Communication: Patient requests pain meds         Time: 1210-1250 PT Time Calculation (min) (ACUTE ONLY): 40 min   Charges:   PT Evaluation $PT Eval Low Complexity: 1  Procedure     PT G Codes:       Modesta Sammons K. Owens Shark, PT  07/11/2015, 12:58 PM

## 2015-07-11 NOTE — Progress Notes (Signed)
[facility]                                                                              Patient Demographics  Steven Barnett, is a 65 y.o. male, DOB - 01-03-1951, [id number]  Admit date - 07/08/2015   Admitting Physician Vianne Bulls, MD  Outpatient Primary MD for the patient is FULBRIGHT, VIRGINIA E, PA-C  LOS - 3   Chief Complaint  Patient presents with  . Shortness of Breath       Brief HPI   Steven Barnett is a 65 y.o. male with PMH of chronic atrial fibrillation on Coumadin, chronic hypertension, chronic diastolic CHF, obesity, and obstructive sleep apnea who had presented to his cardiology clinic earlier in the day for INR check and was directed to the ED for dyspnea at rest for last 2-3 weeks. He also reported orthopnea and lower extremity edema. Patient reported compliance with Lasix. In ED, found to be hypoxic with O2 sats 80's on room air, tachypnea RR 30's. EKG showed A. fib BNP 569, microcytic anemia, patient was given Lasix 80 mg IV 1 and admitted for further workup.    Assessment & Plan    Principal Problem:  Acute on chronic diastolic CHF  - Cardiology consulted, on IV diuresis, Negative balance of 5.6 L, weight down from 278 on adm to 264 today -  Patient has not been able to tolerate facemask for CPAP, will need fitting outpatient. He had a recent diagnosis of OSA from sleep study. - 2-D echo showed EF of 55-60%, no regional wall motion abnormalities.  - Cont ACEi, holding beta-blocker given bradycardia and acute failure - D-dimer 0.32   Chronic Atrial fib  - On coumadin, subtherapeutic at 1.9 - continue Coumadin per pharmacy  - CHADS VASC 3 - Per cardiology, rate is poorly controlled and recommended adding Cardizem and metoprolol   Hypertension - BP  stable, continue lisinopril, diltiazem, hydralazine  - Cardiology recommending adding metoprolol.  OSA  - CPAP qHS , will need fitting outpatient  Iron deficiency anemia,  microcytic anemia - No reports of GI bleeding,  follow stool occult test -H&H stable and improving  - outpatient GI workup Unless FOBT positive or worsening anemia   Code status:  FC   Family Communication: Discussed in detail with the patient, all imaging results, lab results explained to the patient    Disposition Plan: Per cardiology  Time Spent in minutes  15 minutes  Procedures  echo  Consults   cardiology  DVT Prophylaxis  warfarin  Medications  Scheduled Meds: . aspirin  81 mg Oral Daily  . docusate sodium  100 mg Oral BID  . furosemide  40 mg Intravenous BID  . hydrALAZINE  50 mg Oral BID  . lisinopril  20 mg Oral Daily  . metoprolol  50 mg Oral BID  . potassium chloride  30 mEq Oral BID  . sodium chloride  3 mL Intravenous Q12H  . warfarin  8 mg Oral ONCE-1800  . Warfarin - Pharmacist Dosing Inpatient   Does not apply q1800   Continuous Infusions:  PRN Meds:.sodium chloride, acetaminophen, ondansetron (ZOFRAN)  IV, sodium chloride   Antibiotics   Anti-infectives    None        Subjective:   Steven Barnett was seen and examined today. States that he is okay with O2 via nasal cannula however not able to tolerate CPAP at night. Shortness of breath is improving. No fevers or chills or chest pain. Patient denies dizziness, chest pain,  abdominal pain, N/V/D/C, new weakness, numbess, tingling.   Objective:   Blood pressure 124/80, pulse 79, temperature 98.3 F (36.8 C), temperature source Oral, resp. rate 18, height 5\' 10"  (1.778 m), weight 119.886 kg (264 lb 4.8 oz), SpO2 97 %.  Wt Readings from Last 3 Encounters:  07/11/15 119.886 kg (264 lb 4.8 oz)  07/08/15 126.1 kg (278 lb)  04/01/15 134.265 kg (296 lb)     Intake/Output Summary (Last 24 hours) at 07/11/15 1120 Last data filed at 07/11/15 0847  Gross per 24 hour  Intake   1422 ml  Output   2025 ml  Net   -603 ml    Exam  General: Alert and oriented x 3, NAD  HEENT:  PERRLA,  EOMI  Neck: Supple, no JVD, no masses  CVS: ireg ireg   Respiratory: Decreased breath sounds at the bases   Abdomen: Soft, NT, ND, NBS  Ext: no cyanosis clubbing, 1+  edema  Neuro:no new deficits  Skin: No rashes  Psych: Normal affect and demeanor, alert and oriented x3    Data Review   Micro Results No results found for this or any previous visit (from the past 240 hour(s)).  Radiology Reports Dg Chest 2 View  07/08/2015  CLINICAL DATA:  Cough and shortness of breath.  Left chest pain. EXAM: CHEST  2 VIEW COMPARISON:  02/10/2015 and 02/02/2009 FINDINGS: Heart size and pulmonary vascularity are normal. There is extensive scarring in the right hemi thorax including chronic fullness at the inferior aspect of the right hilum, blunting of the costophrenic angle, pleural thickening, and scarring in the right upper and lower lobes. Small area of atelectasis/ infiltrate in the right midzone seen on the prior study has resolved. The left lung is clear. No effusions. No acute osseous abnormality. IMPRESSION: No acute abnormality.  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 Electronically Signed   By: Lorriane Shire M.D.   On: 07/08/2015 12:42    CBC  Recent Labs Lab 07/08/15 1134 07/10/15 0305 07/11/15 0332  WBC 5.9 6.3 5.6  HGB 9.9* 11.6* 10.8*  HCT 32.8* 38.1* 36.2*  PLT 161 189 213  MCV 69.2* 68.3* 67.5*  MCH 20.9* 20.8* 20.1*  MCHC 30.2 30.4 29.8*  RDW 22.2* 21.9* 22.0*    Chemistries   Recent Labs Lab 07/08/15 1134 07/08/15 2346 07/10/15 0305 07/11/15 0332  NA 141  --  136 136  K 3.5  --  3.7 4.1  CL 104  --  101 102  CO2 28  --  24 26  GLUCOSE 96  --  85 80  BUN 8  --  14 13  CREATININE 0.93  --  1.02 0.92  CALCIUM 8.9  --  8.7* 8.5*  MG  --  1.9  --   --    ------------------------------------------------------------------------------------------------------------------ estimated creatinine clearance is 106.7 mL/min (by C-G formula based on Cr of  0.92). ------------------------------------------------------------------------------------------------------------------ No results for input(s): HGBA1C in the last 72 hours. ------------------------------------------------------------------------------------------------------------------ No results for input(s): CHOL, HDL, LDLCALC, TRIG, CHOLHDL, LDLDIRECT in the last 72 hours. ------------------------------------------------------------------------------------------------------------------ No results for input(s): TSH, T4TOTAL, T3FREE,  THYROIDAB in the last 72 hours.  Invalid input(s): FREET3 ------------------------------------------------------------------------------------------------------------------  Recent Labs  07/09/15 0348  FERRITIN 24  TIBC 435  IRON 32*  RETICCTPCT 2.7    Coagulation profile  Recent Labs Lab 07/08/15 0933 07/08/15 1134 07/09/15 0334 07/10/15 0305 07/11/15 0332  INR 2 1.91* 1.88* 1.59* 1.64*     Recent Labs  07/08/15 1558  DDIMER 0.32    Cardiac Enzymes No results for input(s): CKMB, TROPONINI, MYOGLOBIN in the last 168 hours.  Invalid input(s): CK ------------------------------------------------------------------------------------------------------------------ Invalid input(s): POCBNP  No results for input(s): GLUCAP in the last 72 hours.   RAI,RIPUDEEP M.D. [facility] 07/11/2015, 11:20 AM  Pager: [phone] Between 7am to 7pm - call Pager - [phone]  After 7pm go to [email or web address] - password [email or web address]  Call night coverage person covering after 7pm

## 2015-07-11 NOTE — Progress Notes (Signed)
Patient Profile: 65 y.o. male with a history significant for hypertension, sleep disorder consistent with obstructive sleep apnea, recent diagnosis of diastolic heart failure with moderate LVH and recent diagnosis of postoperative atrial fibrillation following a thoarcotomy (continues to be in atrial fibrillation) admitted for acute on chronic diastolic CHF.   Subjective: He feels better with improved dyspnea and palpitations.  Pause on tele 4.5sec  Objective: Vital signs in last 24 hours: Temp:  [98 F (36.7 C)-98.7 F (37.1 C)] 98.3 F (36.8 C) (01/07 0415) Pulse Rate:  [79-94] 79 (01/07 0415) Resp:  [18] 18 (01/07 0415) BP: (102-139)/(62-86) 124/80 mmHg (01/07 0415) SpO2:  [94 %-98 %] 97 % (01/07 0415) Weight:  [264 lb 4.8 oz (119.886 kg)] 264 lb 4.8 oz (119.886 kg) (01/07 0415) Last BM Date: 07/07/15  Intake/Output from previous day: 01/06 0701 - 01/07 0700 In: 1953 [P.O.:1953] Out: 2025 [Urine:2025] Intake/Output this shift: Total I/O In: 240 [P.O.:240] Out: -   Medications Current Facility-Administered Medications  Medication Dose Route Frequency Provider Last Rate Last Dose  . 0.9 %  sodium chloride infusion  250 mL Intravenous PRN Vianne Bulls, MD      . acetaminophen (TYLENOL) tablet 650 mg  650 mg Oral Q4H PRN Vianne Bulls, MD   650 mg at 07/09/15 2118  . aspirin chewable tablet 81 mg  81 mg Oral Daily Vianne Bulls, MD   81 mg at 07/11/15 1115  . docusate sodium (COLACE) capsule 100 mg  100 mg Oral BID Ripudeep K Rai, MD   100 mg at 07/11/15 1116  . furosemide (LASIX) injection 40 mg  40 mg Intravenous BID Vianne Bulls, MD   40 mg at 07/11/15 1116  . hydrALAZINE (APRESOLINE) tablet 50 mg  50 mg Oral BID Vianne Bulls, MD   50 mg at 07/11/15 1116  . lisinopril (PRINIVIL,ZESTRIL) tablet 20 mg  20 mg Oral Daily Vianne Bulls, MD   20 mg at 07/11/15 1115  . metoprolol (LOPRESSOR) tablet 50 mg  50 mg Oral BID Brittainy Erie Noe, PA-C   50 mg at 07/11/15  1116  . ondansetron (ZOFRAN) injection 4 mg  4 mg Intravenous Q6H PRN Ilene Qua Opyd, MD      . potassium chloride (K-DUR,KLOR-CON) CR tablet 30 mEq  30 mEq Oral BID Vianne Bulls, MD   30 mEq at 07/11/15 1115  . sodium chloride 0.9 % injection 3 mL  3 mL Intravenous Q12H Ilene Qua Opyd, MD   3 mL at 07/11/15 1000  . sodium chloride 0.9 % injection 3 mL  3 mL Intravenous PRN Ilene Qua Opyd, MD      . warfarin (COUMADIN) tablet 8 mg  8 mg Oral ONCE-1800 Meagan Ival Bible, RPH      . Warfarin - Pharmacist Dosing Inpatient   Does not apply Hoskins, RPH   4 each at 07/10/15 1733    PE: General appearance: alert, cooperative and no distress Neck: no carotid bruit and no JVD Lungs: crackles in bases bilaterally Heart: irregularly irregular rhythm and tachy rate (pause on tele) Extremities: no LEE Pulses: 2+ and symmetric Skin: warm and dry Neurologic: Grossly normal  Lab Results:   Recent Labs  07/08/15 1134 07/10/15 0305 07/11/15 0332  WBC 5.9 6.3 5.6  HGB 9.9* 11.6* 10.8*  HCT 32.8* 38.1* 36.2*  PLT 161 189 213   BMET  Recent Labs  07/08/15 1134 07/10/15 0305 07/11/15 0332  NA 141  136 136  K 3.5 3.7 4.1  CL 104 101 102  CO2 28 24 26   GLUCOSE 96 85 80  BUN 8 14 13   CREATININE 0.93 1.02 0.92  CALCIUM 8.9 8.7* 8.5*   PT/INR  Recent Labs  07/09/15 0334 07/10/15 0305 07/11/15 0332  LABPROT 21.5* 19.0* 19.5*  INR 1.88* 1.59* 1.64*    Assessment/Plan  Principal Problem:   Acute on chronic diastolic (congestive) heart failure (HCC) Active Problems:   Morbid obesity ([place])   Obstructive sleep apnea   Essential hypertension, benign   Permanent atrial fibrillation (HCC)   Acute on chronic congestive heart failure ([place])  1. Acute on Chronic Diastolic CHF: good diuresis with IV lasix.  Net I/Os negative 5.6L since admit. Renal function and K are both stable. Volume appears stable. Can transition to PO lasix today.   2. Permanent Atrial  Fibrillation: rate is improved with addition of metoprolol. He is symptomatic when he ambulates. He was on 50 mg of metoprolol BID and 120 mg Cardizem as an outpatient but this was held on admission. 4.5 second pause noted at 4am 07/10/14. His BP is stable. Back on metoprolol for rate control. Will hold on restarting Cardizem. He is on coumadin for a/c. INR is subtherapeutic at 1.59. Continue dosing per pharmacy.   3. OSA: noncomplaint with CPAP.   4. Pause: 4.5 sec at 4am, asymptomatic. OK to continue current metoprolol but would not add back diltiazem. No need for pacer at this time. OSA exacerbated.    LOS: 3 days    Candee Furbish, MD   07/11/2015 11:30 AM

## 2015-07-12 LAB — CBC
HCT: 36.3 % — ABNORMAL LOW (ref 39.0–52.0)
HEMOGLOBIN: 10.8 g/dL — AB (ref 13.0–17.0)
MCH: 20.4 pg — AB (ref 26.0–34.0)
MCHC: 29.8 g/dL — ABNORMAL LOW (ref 30.0–36.0)
MCV: 68.5 fL — ABNORMAL LOW (ref 78.0–100.0)
Platelets: 235 10*3/uL (ref 150–400)
RBC: 5.3 MIL/uL (ref 4.22–5.81)
RDW: 22 % — ABNORMAL HIGH (ref 11.5–15.5)
WBC: 5.6 10*3/uL (ref 4.0–10.5)

## 2015-07-12 LAB — BASIC METABOLIC PANEL
ANION GAP: 5 (ref 5–15)
BUN: 17 mg/dL (ref 6–20)
CHLORIDE: 103 mmol/L (ref 101–111)
CO2: 26 mmol/L (ref 22–32)
Calcium: 8.3 mg/dL — ABNORMAL LOW (ref 8.9–10.3)
Creatinine, Ser: 0.9 mg/dL (ref 0.61–1.24)
Glucose, Bld: 71 mg/dL (ref 65–99)
POTASSIUM: 4.2 mmol/L (ref 3.5–5.1)
SODIUM: 134 mmol/L — AB (ref 135–145)

## 2015-07-12 LAB — PROTIME-INR
INR: 1.82 — ABNORMAL HIGH (ref 0.00–1.49)
PROTHROMBIN TIME: 21 s — AB (ref 11.6–15.2)

## 2015-07-12 MED ORDER — WARFARIN SODIUM 5 MG PO TABS: 8.0000 mg | ORAL_TABLET | Freq: Once | ORAL | Status: DC

## 2015-07-12 MED ORDER — POTASSIUM CHLORIDE CRYS ER 20 MEQ PO TBCR: 30.0000 meq | EXTENDED_RELEASE_TABLET | Freq: Every day | ORAL | Status: DC

## 2015-07-12 MED ORDER — DOCUSATE SODIUM 100 MG PO CAPS: 100.0000 mg | ORAL_CAPSULE | Freq: Two times a day (BID) | ORAL | Status: DC

## 2015-07-12 MED ORDER — FUROSEMIDE 40 MG PO TABS: 40.0000 mg | ORAL_TABLET | Freq: Two times a day (BID) | ORAL | Status: DC

## 2015-07-12 MED ORDER — POTASSIUM CHLORIDE CRYS ER 20 MEQ PO TBCR: 20.0000 meq | EXTENDED_RELEASE_TABLET | Freq: Every day | ORAL | Status: DC

## 2015-07-12 NOTE — Discharge Summary (Signed)
Physician Discharge Summary   Patient ID: Steven Barnett MRN: [id number] DOB/AGE: 64-Nov-1952 64 y.o.  Admit date: 07/08/2015 Discharge date: 07/12/2015  Primary Care Physician:  Elisabeth Cara, PA-C  Discharge Diagnoses:    . Acute on chronic diastolic (congestive) heart failure ([place]) . Essential hypertension, benign . Morbid obesity ([place]) . Permanent atrial fibrillation ([place]) . Obstructive sleep apnea  Consults:  Cardiology  Recommendations for Outpatient Follow-up:  1. Patient did well with PT evaluation, and does not need a PT follow-up.  2. Please repeat CBC/BMET at next visit 3. Patient had difficulty with CPAP fitting while inpatient, will need formal fitting outpatient. 4. Outpatient GI workup including screening colonoscopy recommended for microcytic anemia    DIET: Heart healthy diet    Allergies:  No Known Allergies   DISCHARGE MEDICATIONS: Current Discharge Medication List    START taking these medications   Details  docusate sodium (COLACE) 100 MG capsule Take 1 capsule (100 mg total) by mouth 2 (two) times daily. For constipation Qty: 60 capsule, Refills: 3    potassium chloride (K-DUR,KLOR-CON) 20 MEQ tablet Take 1 tablet (20 mEq total) by mouth daily. Qty: 30 tablet, Refills: 3      CONTINUE these medications which have NOT CHANGED   Details  acetaminophen (TYLENOL) 500 MG tablet Take 2,000 mg by mouth 2 (two) times daily.    aspirin 81 MG tablet Take 81 mg by mouth daily.    furosemide (LASIX) 40 MG tablet TAKE ONE TABLET BY MOUTH TWICE DAILY Qty: 30 tablet, Refills: 5    hydrALAZINE (APRESOLINE) 50 MG tablet Take 1 tablet (50 mg total) by mouth 2 (two) times daily. Qty: 60 tablet, Refills: 9    ipratropium (ATROVENT HFA) 17 MCG/ACT inhaler Inhale 2 puffs into the lungs as needed for wheezing.    lisinopril (PRINIVIL,ZESTRIL) 20 MG tablet TAKE ONE TABLET BY MOUTH ONCE DAILY Qty: 30 tablet, Refills: 11    metoprolol (LOPRESSOR) 25 MG  tablet Take 2 tablets (50 mg total) by mouth 2 (two) times daily. Qty: 60 tablet, Refills: 5    sildenafil (REVATIO) 20 MG tablet TAKE 2 TO 3 TABLETS BY MOUTH DAILY AS NEEDED FOR SEXUAL ACTIVITY Qty: 30 tablet, Refills: 0    warfarin (COUMADIN) 4 MG tablet TAKE ONE TO TWO TABLETS BY MOUTH ONCE DAILY AS  DIRECTED  BY  COUMADIN  CLINIC Qty: 40 tablet, Refills: 1      STOP taking these medications     Dextromethorphan-Guaifenesin (MUCINEX DM MAXIMUM STRENGTH) 60-1200 MG TB12      diltiazem (CARDIZEM) 120 MG tablet          Brief H and P: For complete details please refer to admission H and P, but in brief Steven Barnett is a 65 y.o. male with PMH of chronic atrial fibrillation on Coumadin, chronic hypertension, chronic diastolic CHF, obesity, and obstructive sleep apnea who had presented to his cardiology clinic earlier in the day for INR check and was directed to the ED for dyspnea at rest for last 2-3 weeks. He also reported orthopnea and lower extremity edema. Patient reported compliance with Lasix. In ED, found to be hypoxic with O2 sats 80's on room air, tachypnea RR 30's. EKG showed A. fib BNP 569, microcytic anemia, patient was given Lasix 80 mg IV 1 and admitted for further workup.  Hospital Course:  Acute on chronic diastolic CHF  - Cardiology was consulted, patient was placed on IV Lasix for diuresis  -  Negative balance of  5.9 L, weight down from 278 on adm to 264 today - Patient has not been able to tolerate facemask for CPAP, will need formal fitting outpatient. He had a recent diagnosis of OSA from sleep study. - 2-D echo showed EF of 55-60%, no regional wall motion abnormalities.  - D-dimer 0.32  Chronic Atrial fib  - On coumadin, subtherapeutic at 1.9 - continue Coumadin per pharmacy  - CHADS VASC 3 - Cardizem was discontinued due to bradycardia, patient has been restarted on metoprolol, tolerating well  Hypertension - BP stable, continue lisinopril,  diltiazem, hydralazine  - Cardiology recommending adding metoprolol.  OSA  - CPAP qHS, will need fitting outpatient  Iron deficiency anemia, microcytic anemia - No reports of GI bleeding, H&H remained stable, -Outpatient GI workup recommended   Day of Discharge BP 136/73 mmHg  Pulse 67  Temp(Src) 97.6 F (36.4 C) (Oral)  Resp 18  Ht 5\' 10"  (1.778 m)  Wt 119.795 kg (264 lb 1.6 oz)  BMI 37.89 kg/m2  SpO2 100%  Physical Exam: General: Alert and awake oriented x3 not in any acute distress. HEENT: anicteric sclera, pupils reactive to light and accommodation CVS: S1-S2 clear no murmur rubs or gallops Chest: clear to auscultation bilaterally, no wheezing rales or rhonchi Abdomen: soft nontender, nondistended, normal bowel sounds Extremities: no cyanosis, clubbing or edema noted bilaterally Neuro: Cranial nerves II-XII intact, no focal neurological deficits   The results of significant diagnostics from this hospitalization (including imaging, microbiology, ancillary and laboratory) are listed below for reference.    LAB RESULTS: Basic Metabolic Panel:  Recent Labs Lab 07/08/15 2346  07/11/15 0332 07/12/15 0250  NA  --   < > 136 134*  K  --   < > 4.1 4.2  CL  --   < > 102 103  CO2  --   < > 26 26  GLUCOSE  --   < > 80 71  BUN  --   < > 13 17  CREATININE  --   < > 0.92 0.90  CALCIUM  --   < > 8.5* 8.3*  MG 1.9  --   --   --   < > = values in this interval not displayed. Liver Function Tests: No results for input(s): AST, ALT, ALKPHOS, BILITOT, PROT, ALBUMIN in the last 168 hours. No results for input(s): LIPASE, AMYLASE in the last 168 hours. No results for input(s): AMMONIA in the last 168 hours. CBC:  Recent Labs Lab 07/11/15 0332 07/12/15 0250  WBC 5.6 5.6  HGB 10.8* 10.8*  HCT 36.2* 36.3*  MCV 67.5* 68.5*  PLT 213 235   Cardiac Enzymes: No results for input(s): CKTOTAL, CKMB, CKMBINDEX, TROPONINI in the last 168 hours. BNP: Invalid input(s):  POCBNP CBG: No results for input(s): GLUCAP in the last 168 hours.  Significant Diagnostic Studies:  Dg Chest 2 View  07/08/2015  CLINICAL DATA:  Cough and shortness of breath.  Left chest pain. EXAM: CHEST  2 VIEW COMPARISON:  02/10/2015 and 02/02/2009 FINDINGS: Heart size and pulmonary vascularity are normal. There is extensive scarring in the right hemi thorax including chronic fullness at the inferior aspect of the right hilum, blunting of the costophrenic angle, pleural thickening, and scarring in the right upper and lower lobes. Small area of atelectasis/ infiltrate in the right midzone seen on the prior study has resolved. The left lung is clear. No effusions. No acute osseous abnormality. IMPRESSION: No acute abnormality.  Extensive scarring in the right hemi  thorax. Electronically Signed   By: Lorriane Shire M.D.   On: 07/08/2015 12:42    2D ECHO: Study Conclusions  - Left ventricle: The cavity size was normal. Wall thickness was increased in a pattern of moderate LVH. Systolic function was normal. The estimated ejection fraction was in the range of 55% to 60%. Wall motion was normal; there were no regional wall motion abnormalities. The study is not technically sufficient to allow evaluation of LV diastolic function. - Aortic valve: Trileaflet. Sclerosis without stenosis. There was no regurgitation. - Mitral valve: There was mild regurgitation. - Left atrium: Moderately dilated at 45 ml/m2. - Right ventricle: The cavity size was mildly dilated. - Right atrium: Moderate to severely dilated. - Inferior vena cava: The vessel was normal in size. The respirophasic diameter changes were in the normal range (>= 50%), consistent with normal central venous pressure.  Impressions:  - Compared to the prior echo in 2010, LVEF remains unchanged.  Disposition and Follow-up: Discharge Instructions    (HEART FAILURE PATIENTS) Call MD:  Anytime you have any of the following  symptoms: 1) 3 pound weight gain in 24 hours or 5 pounds in 1 week 2) shortness of breath, with or without a dry hacking cough 3) swelling in the hands, feet or stomach 4) if you have to sleep on extra pillows at night in order to breathe.    Complete by:  As directed      Diet Carb Modified    Complete by:  As directed      Increase activity slowly    Complete by:  As directed             DISPOSITION: Home   DISCHARGE FOLLOW-UP Follow-up Information    Follow up with [facility].   Specialty:  [place]   Why:  They will do your home health care at your home   Contact information:   [place] [address] [phone]       Follow up with FULBRIGHT, VIRGINIA E, PA-C. Schedule an appointment as soon as possible for a visit in 2 weeks.   Specialty:  Family Medicine   Why:  for hospital follow-up   Contact information:   [id number] [place] [address] [phone]       Follow up with Pixie Casino, MD. Schedule an appointment as soon as possible for a visit in 2 weeks.   Specialty:  Cardiology   Why:  for hospital follow-up   Contact information:   [place] [address] [phone]        Time spent on Discharge: 35 minutes  Signed:   RAI,RIPUDEEP M.D. [facility] 07/12/2015, 12:15 PM Pager: [phone]

## 2015-07-12 NOTE — Evaluation (Signed)
Occupational Therapy Evaluation Patient Details Name: Steven Barnett MRN: [id number] DOB: 29-Aug-1950 Today's Date: 07-20-2015    History of Present Illness pt admitted with acute on chonic CHF He has been undergoing diuresis while in hospital and states he feels much better  He states he usually takdes tylenol all the time for pain in his knees and feet and can tell he hasn't had it lately    Clinical Impression   Pt at baseline level with ADL activity     Follow Up Recommendations  No OT follow up    Equipment Recommendations  None recommended by OT    Recommendations for Other Services       Precautions / Restrictions Precautions Precautions: None      Mobility Bed Mobility Overal bed mobility: Independent                Transfers Overall transfer level: Independent                    Balance                                            ADL Overall ADL's : At baseline                                       General ADL Comments: pt able to shower I ly during OT session               Pertinent Vitals/Pain Pain Assessment: No/denies pain     Hand Dominance     Extremity/Trunk Assessment Upper Extremity Assessment Upper Extremity Assessment: Generalized weakness           Communication Communication Communication: No difficulties   Cognition Arousal/Alertness: Awake/alert;Lethargic (needed extra time to wake up ) Behavior During Therapy: [facility] for tasks assessed/performed;Agitated (needs extra encouragement to participate with PT ) Overall Cognitive Status: Within Functional Limits for tasks assessed                                Home Living Family/patient expects to be discharged to:: Private residence Living Arrangements: Spouse/significant other   Type of Home: Apartment Home Access: Stairs to enter           Bathroom Shower/Tub: Tub/shower unit;Walk-in shower   Bathroom  Toilet: Standard     Home Equipment: None          Prior Functioning/Environment Level of Independence: Independent        Comments: 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  He is not interested in learning an exercie program     OT Diagnosis:           OT Goals(Current goals can be found in the care plan section) Acute Rehab OT Goals Patient Stated Goal: home today  OT Frequency:                End of Session    Activity Tolerance: Patient tolerated treatment well Patient left: in chair;with call bell/phone within reach   Time: 1214-1232 OT Time Calculation (min): 18 min Charges:  OT General Charges $OT Visit: 1 Procedure OT Evaluation $OT Eval Low Complexity: 1 Procedure G-Codes:    Betsy Pries 07/20/2015, 12:57  PM

## 2015-07-12 NOTE — Progress Notes (Signed)
[facility] for Coumadin Indication: atrial fibrillation  No Known Allergies  Patient Measurements: Height: 5\' 10"  (177.8 cm) Weight: 264 lb 1.6 oz (119.795 kg) (scale b) IBW/kg (Calculated) : 73  Vital Signs: Temp: 97.6 F (36.4 C) (01/08 0605) Temp Source: Oral (01/08 0605) BP: 136/73 mmHg (01/08 0605) Pulse Rate: 67 (01/08 0605)  Labs:  Recent Labs  07/10/15 0305 07/11/15 0332 07/12/15 0250  HGB 11.6* 10.8* 10.8*  HCT 38.1* 36.2* 36.3*  PLT 189 213 235  LABPROT 19.0* 19.5* 21.0*  INR 1.59* 1.64* 1.82*  CREATININE 1.02 0.92 0.90    Estimated Creatinine Clearance: 109 mL/min (by C-G formula based on Cr of 0.9).   Assessment: 65 yo M presents on 1/4 with SOB. On coumadin pta for afib.  PTA dose: 8mg  daily exc 6mg  on Monday. INR on admit 1.91.    Received 10mg  doses on 1/5 and 1/6.   INR today = 1.82, CBC stable, no bleeding noted   Goal of Therapy:  INR 2-3 Monitor platelets by anticoagulation protocol: Yes   Plan:  Coumadin 8 mg po x 1 tonight Monitor daily INR, CBC, s/s of bleed  Rogue Rafalski C. Lennox Grumbles, PharmD Pharmacy Resident  Pager: [phone] 07/12/2015 8:54 AM

## 2015-07-12 NOTE — Progress Notes (Addendum)
Patient Profile: 65 y.o. male with a history significant for hypertension, sleep disorder consistent with obstructive sleep apnea, recent diagnosis of diastolic heart failure with moderate LVH and previous diagnosis of postoperative atrial fibrillation following a thoarcotomy (continues to be in atrial fibrillation) admitted for acute on chronic diastolic CHF.   Subjective: He feels better with improved dyspnea and palpitations.  Pause on tele 4.5sec  Objective: Vital signs in last 24 hours: Temp:  [97.6 F (36.4 C)-98 F (36.7 C)] 97.6 F (36.4 C) (01/08 0605) Pulse Rate:  [67-86] 67 (01/08 0605) Resp:  [16-18] 18 (01/08 0605) BP: (91-136)/(46-73) 136/73 mmHg (01/08 0605) SpO2:  [100 %] 100 % (01/08 0605) Weight:  [264 lb 1.6 oz (119.795 kg)] 264 lb 1.6 oz (119.795 kg) (01/08 0605) Last BM Date: 07/07/15  Intake/Output from previous day: 01/07 0701 - 01/08 0700 In: 1267 [P.O.:1267] Out: 1350 [Urine:1350] Intake/Output this shift: Total I/O In: 360 [P.O.:360] Out: 300 [Urine:300]  Medications Current Facility-Administered Medications  Medication Dose Route Frequency Provider Last Rate Last Dose  . 0.9 %  sodium chloride infusion  250 mL Intravenous PRN Vianne Bulls, MD      . acetaminophen (TYLENOL) tablet 650 mg  650 mg Oral Q4H PRN Vianne Bulls, MD   650 mg at 07/12/15 0843  . aspirin chewable tablet 81 mg  81 mg Oral Daily Vianne Bulls, MD   81 mg at 07/12/15 0844  . docusate sodium (COLACE) capsule 100 mg  100 mg Oral BID Ripudeep K Rai, MD   100 mg at 07/12/15 0843  . furosemide (LASIX) tablet 40 mg  40 mg Oral BID Ripudeep Krystal Eaton, MD   40 mg at 07/12/15 1000  . hydrALAZINE (APRESOLINE) tablet 50 mg  50 mg Oral BID Vianne Bulls, MD   50 mg at 07/12/15 0844  . lisinopril (PRINIVIL,ZESTRIL) tablet 20 mg  20 mg Oral Daily Vianne Bulls, MD   20 mg at 07/12/15 0843  . metoprolol (LOPRESSOR) tablet 50 mg  50 mg Oral BID Brittainy Erie Noe, PA-C   50 mg at  07/12/15 0844  . ondansetron (ZOFRAN) injection 4 mg  4 mg Intravenous Q6H PRN Ilene Qua Opyd, MD      . sodium chloride 0.9 % injection 3 mL  3 mL Intravenous Q12H Ilene Qua Opyd, MD   3 mL at 07/12/15 1000  . sodium chloride 0.9 % injection 3 mL  3 mL Intravenous PRN Ilene Qua Opyd, MD      . warfarin (COUMADIN) tablet 8 mg  8 mg Oral ONCE-1800 Meagan Ival Bible, RPH      . Warfarin - Pharmacist Dosing Inpatient   Does not apply [place], RPH   4 each at 07/10/15 1733    PE: General appearance: alert, cooperative and no distress Neck: no carotid bruit and no JVD Lungs: crackles in bases bilaterally Heart: irregularly irregular rhythm and tachy rate (pause on tele) Extremities: no LEE Pulses: 2+ and symmetric Skin: warm and dry Neurologic: Grossly normal  Lab Results:   Recent Labs  07/10/15 0305 07/11/15 0332 07/12/15 0250  WBC 6.3 5.6 5.6  HGB 11.6* 10.8* 10.8*  HCT 38.1* 36.2* 36.3*  PLT 189 213 235   BMET  Recent Labs  07/10/15 0305 07/11/15 0332 07/12/15 0250  NA 136 136 134*  K 3.7 4.1 4.2  CL 101 102 103  CO2 24 26 26   GLUCOSE 85 80 71  BUN 14 13  17  CREATININE 1.02 0.92 0.90  CALCIUM 8.7* 8.5* 8.3*   PT/INR  Recent Labs  07/10/15 0305 07/11/15 0332 07/12/15 0250  LABPROT 19.0* 19.5* 21.0*  INR 1.59* 1.64* 1.82*    Assessment/Plan  Principal Problem:   Acute on chronic diastolic (congestive) heart failure (HCC) Active Problems:   Morbid obesity ([place])   Obstructive sleep apnea   Essential hypertension, benign   Permanent atrial fibrillation (HCC)   Acute on chronic congestive heart failure ([place])  1. Acute on Chronic Diastolic CHF: Patient appears to be euvolemic on examination. Continue present dose of Lasix at discharge. I have asked him to weigh himself daily. If his weight increases by 3 pounds in 1 day he will take an additional 40 mg of Lasix. I instructed him on low-sodium diet.  2. Permanent Atrial Fibrillation:  Telemetry has been personally reviewed. His heart rate appears to be reasonably well controlled with metoprolol. Cardizem was discontinued because of bradycardia/pause. We will continue off of Cardizem at discharge. Continue Coumadin at discharge.  3. OSA: noncomplaint with CPAP.   4. Hypertension: Blood pressure controlled. Continue present medications. Patient can be discharged from a cardiac standpoint. He should follow-up with Dr. [name] following DC. Please call with questions.  5. Microcytic anemia: Further evaluation per primary care.   LOS: 4 days    Kirk Ruths, MD   07/12/2015 11:27 AM

## 2015-07-12 NOTE — Progress Notes (Signed)
IV and Tele removed.

## 2015-07-18 ENCOUNTER — Other Ambulatory Visit: Payer: Self-pay | Admitting: Internal Medicine

## 2015-07-20 NOTE — Telephone Encounter (Signed)
Rx request sent to pharmacy.  

## 2015-07-23 ENCOUNTER — Encounter: Payer: Self-pay | Admitting: Physician Assistant

## 2015-07-23 ENCOUNTER — Encounter: Payer: Medicaid Other | Admitting: Physician Assistant

## 2015-07-23 NOTE — Progress Notes (Signed)
    Cardiology Office Note   Date:  07/23/2015   ID:  Steven Barnett, DOB [birth year], MRN [id number]  PCP:  Elisabeth Cara, PA-C  Cardiologist:  Dr Marchelle Folks, PA-C   No chief complaint on file.   History of Present Illness: Steven Barnett is a 65 y.o. male with a history of perm afib on coumadin, D-CHF, HTN, OSA, morbid obesity   This encounter was created in error - please disregard.

## 2015-07-27 ENCOUNTER — Other Ambulatory Visit: Payer: Self-pay | Admitting: Internal Medicine

## 2015-07-28 NOTE — Telephone Encounter (Signed)
Rx request sent to pharmacy.  

## 2015-08-05 ENCOUNTER — Emergency Department (HOSPITAL_COMMUNITY): Payer: Medicaid Other

## 2015-08-05 ENCOUNTER — Ambulatory Visit (INDEPENDENT_AMBULATORY_CARE_PROVIDER_SITE_OTHER): Payer: Medicaid Other | Admitting: Physician Assistant

## 2015-08-05 ENCOUNTER — Encounter: Payer: Self-pay | Admitting: Physician Assistant

## 2015-08-05 ENCOUNTER — Ambulatory Visit (INDEPENDENT_AMBULATORY_CARE_PROVIDER_SITE_OTHER): Payer: Medicaid Other | Admitting: Pharmacist Clinician (PhC)/ Clinical Pharmacy Specialist

## 2015-08-05 ENCOUNTER — Encounter (HOSPITAL_COMMUNITY): Payer: Self-pay | Admitting: Emergency Medicine

## 2015-08-05 ENCOUNTER — Inpatient Hospital Stay (HOSPITAL_COMMUNITY)
Admission: EM | Admit: 2015-08-05 | Discharge: 2015-08-08 | DRG: 291 | Disposition: A | Discharge status: Home Health | Payer: Medicaid Other | Attending: Cardiovascular Disease | Admitting: Cardiovascular Disease

## 2015-08-05 VITALS — BP 170/100 | HR 80 | Ht 71.0 in | Wt 288.4 lb

## 2015-08-05 DIAGNOSIS — Z6841 Body Mass Index (BMI) 40.0 and over, adult: Secondary | ICD-10-CM | POA: Diagnosis not present

## 2015-08-05 DIAGNOSIS — I482 Chronic atrial fibrillation: Secondary | ICD-10-CM | POA: Diagnosis present

## 2015-08-05 DIAGNOSIS — Z7982 Long term (current) use of aspirin: Secondary | ICD-10-CM

## 2015-08-05 DIAGNOSIS — G4733 Obstructive sleep apnea (adult) (pediatric): Secondary | ICD-10-CM

## 2015-08-05 DIAGNOSIS — Z823 Family history of stroke: Secondary | ICD-10-CM | POA: Diagnosis not present

## 2015-08-05 DIAGNOSIS — Z87891 Personal history of nicotine dependence: Secondary | ICD-10-CM

## 2015-08-05 DIAGNOSIS — J9691 Respiratory failure, unspecified with hypoxia: Secondary | ICD-10-CM | POA: Diagnosis present

## 2015-08-05 DIAGNOSIS — I4821 Permanent atrial fibrillation: Secondary | ICD-10-CM

## 2015-08-05 DIAGNOSIS — Z833 Family history of diabetes mellitus: Secondary | ICD-10-CM

## 2015-08-05 DIAGNOSIS — I5030 Unspecified diastolic (congestive) heart failure: Secondary | ICD-10-CM | POA: Diagnosis not present

## 2015-08-05 DIAGNOSIS — I110 Hypertensive heart disease with heart failure: Principal | ICD-10-CM | POA: Diagnosis present

## 2015-08-05 DIAGNOSIS — I5033 Acute on chronic diastolic (congestive) heart failure: Secondary | ICD-10-CM

## 2015-08-05 DIAGNOSIS — E785 Hyperlipidemia, unspecified: Secondary | ICD-10-CM | POA: Diagnosis not present

## 2015-08-05 DIAGNOSIS — Z7901 Long term (current) use of anticoagulants: Secondary | ICD-10-CM

## 2015-08-05 DIAGNOSIS — Z8249 Family history of ischemic heart disease and other diseases of the circulatory system: Secondary | ICD-10-CM | POA: Diagnosis not present

## 2015-08-05 DIAGNOSIS — R0602 Shortness of breath: Secondary | ICD-10-CM | POA: Diagnosis present

## 2015-08-05 DIAGNOSIS — I10 Essential (primary) hypertension: Secondary | ICD-10-CM | POA: Diagnosis not present

## 2015-08-05 DIAGNOSIS — I509 Heart failure, unspecified: Secondary | ICD-10-CM

## 2015-08-05 LAB — I-STAT VENOUS BLOOD GAS, ED
ACID-BASE EXCESS: 5 mmol/L — AB (ref 0.0–2.0)
BICARBONATE: 28.2 meq/L — AB (ref 20.0–24.0)
O2 Saturation: 73 %
TCO2: 29 mmol/L (ref 0–100)
pCO2, Ven: 36.7 mmHg — ABNORMAL LOW (ref 45.0–50.0)
pH, Ven: 7.493 — ABNORMAL HIGH (ref 7.250–7.300)
pO2, Ven: 34 mmHg (ref 30.0–45.0)

## 2015-08-05 LAB — BASIC METABOLIC PANEL
ANION GAP: 10 (ref 5–15)
BUN: 12 mg/dL (ref 6–20)
CHLORIDE: 101 mmol/L (ref 101–111)
CO2: 29 mmol/L (ref 22–32)
Calcium: 9 mg/dL (ref 8.9–10.3)
Creatinine, Ser: 0.98 mg/dL (ref 0.61–1.24)
GFR calc Af Amer: >60 mL/min (ref >60)
GFR calc non Af Amer: >60 mL/min (ref >60)
GLUCOSE: 86 mg/dL (ref 65–99)
POTASSIUM: 4 mmol/L (ref 3.5–5.1)
Sodium: 140 mmol/L (ref 135–145)

## 2015-08-05 LAB — PROTIME-INR
INR: 2.24 — ABNORMAL HIGH (ref 0.00–1.49)
PROTHROMBIN TIME: 24.6 s — AB (ref 11.6–15.2)

## 2015-08-05 LAB — CBC
HEMATOCRIT: 32.3 % — AB (ref 39.0–52.0)
HEMOGLOBIN: 9.4 g/dL — AB (ref 13.0–17.0)
MCH: 20.5 pg — ABNORMAL LOW (ref 26.0–34.0)
MCHC: 29.1 g/dL — AB (ref 30.0–36.0)
MCV: 70.4 fL — AB (ref 78.0–100.0)
Platelets: 169 10*3/uL (ref 150–400)
RBC: 4.59 MIL/uL (ref 4.22–5.81)
RDW: 21.7 % — AB (ref 11.5–15.5)
WBC: 4.6 10*3/uL (ref 4.0–10.5)

## 2015-08-05 LAB — POCT INR: INR: 2.4

## 2015-08-05 LAB — BRAIN NATRIURETIC PEPTIDE: B NATRIURETIC PEPTIDE 5: 571.9 pg/mL — AB (ref 0.0–100.0)

## 2015-08-05 LAB — I-STAT TROPONIN, ED: Troponin i, poc: 0.01 ng/mL (ref 0.00–0.08)

## 2015-08-05 IMAGING — CR DG CHEST 1V PORT
1 series · 1 of 1 positions shown · non-contrast
Comparison: [DATE]

CLINICAL DATA: Onset 2 weeks ago with intermittent shortness of
breath. Worsening on exertion.

EXAM:
PORTABLE CHEST 1 VIEW

[AP]
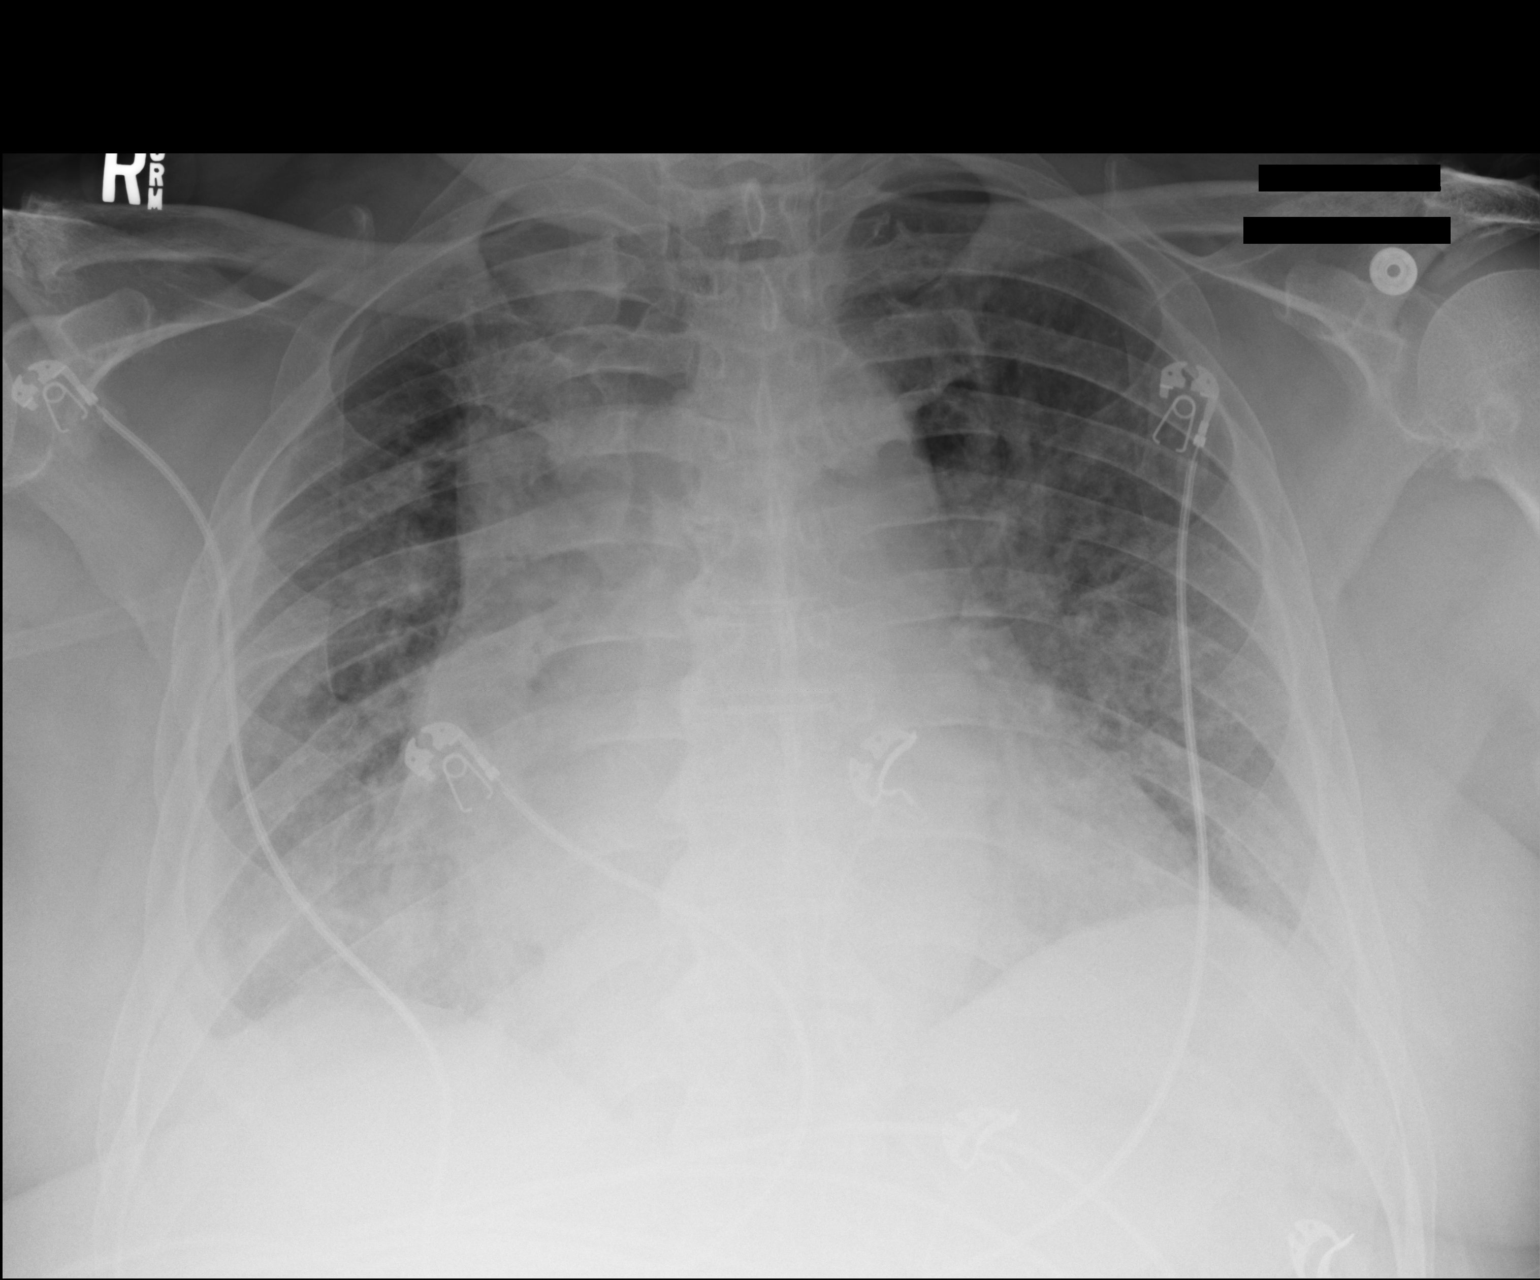

[1 of 1 positions shown; findings below may reference images not displayed]

FINDINGS: Bilateral interstitial and alveolar airspace opacities. Small right
pleural effusion. No focal consolidation or pneumothorax. Stable
cardiomegaly. No acute osseous abnormality.
IMPRESSION: Findings most concerning for CHF.

## 2015-08-05 MED ORDER — FUROSEMIDE 10 MG/ML IJ SOLN
40.0000 mg | Freq: Two times a day (BID) | INTRAMUSCULAR | Status: DC
  Administered 2015-08-05 – 2015-08-07 (×4): 40 mg via INTRAVENOUS
  Filled 2015-08-05 (×4): qty 4

## 2015-08-05 MED ORDER — SODIUM CHLORIDE 0.9 % IV SOLN: 250.0000 mL | INTRAVENOUS | Status: DC | PRN

## 2015-08-05 MED ORDER — ONDANSETRON HCL 4 MG/2ML IJ SOLN: 4.0000 mg | Freq: Four times a day (QID) | INTRAMUSCULAR | Status: DC | PRN

## 2015-08-05 MED ORDER — FUROSEMIDE 10 MG/ML IJ SOLN
80.0000 mg | Freq: Once | INTRAMUSCULAR | Status: AC
  Administered 2015-08-05: 80 mg via INTRAVENOUS
  Filled 2015-08-05: qty 8

## 2015-08-05 MED ORDER — LISINOPRIL 20 MG PO TABS
20.0000 mg | ORAL_TABLET | Freq: Every day | ORAL | Status: DC
  Administered 2015-08-06 – 2015-08-08 (×3): 20 mg via ORAL
  Filled 2015-08-05 (×4): qty 1

## 2015-08-05 MED ORDER — FUROSEMIDE 10 MG/ML IJ SOLN: 40.0000 mg | Freq: Two times a day (BID) | INTRAMUSCULAR | Status: DC

## 2015-08-05 MED ORDER — WARFARIN SODIUM 4 MG PO TABS: ORAL_TABLET | ORAL | Status: DC

## 2015-08-05 MED ORDER — HYDRALAZINE HCL 50 MG PO TABS
50.0000 mg | ORAL_TABLET | Freq: Two times a day (BID) | ORAL | Status: DC
  Administered 2015-08-05 – 2015-08-08 (×7): 50 mg via ORAL
  Filled 2015-08-05 (×2): qty 1
  Filled 2015-08-05: qty 2
  Filled 2015-08-05 (×4): qty 1

## 2015-08-05 MED ORDER — DILTIAZEM HCL ER COATED BEADS 120 MG PO CP24
120.0000 mg | ORAL_CAPSULE | Freq: Every day | ORAL | Status: DC
  Administered 2015-08-05 – 2015-08-08 (×4): 120 mg via ORAL
  Filled 2015-08-05 (×4): qty 1

## 2015-08-05 MED ORDER — SODIUM CHLORIDE 0.9% FLUSH
3.0000 mL | Freq: Two times a day (BID) | INTRAVENOUS | Status: DC
  Administered 2015-08-05 – 2015-08-08 (×6): 3 mL via INTRAVENOUS

## 2015-08-05 MED ORDER — SODIUM CHLORIDE 0.9% FLUSH: 3.0000 mL | INTRAVENOUS | Status: DC | PRN

## 2015-08-05 MED ORDER — WARFARIN - PHARMACIST DOSING INPATIENT
Freq: Every day | Status: DC
  Administered 2015-08-06: 19:00:00

## 2015-08-05 MED ORDER — POTASSIUM CHLORIDE CRYS ER 20 MEQ PO TBCR
20.0000 meq | EXTENDED_RELEASE_TABLET | Freq: Two times a day (BID) | ORAL | Status: DC
  Administered 2015-08-05 – 2015-08-08 (×7): 20 meq via ORAL
  Filled 2015-08-05 (×7): qty 1

## 2015-08-05 MED ORDER — METOPROLOL TARTRATE 50 MG PO TABS
50.0000 mg | ORAL_TABLET | Freq: Two times a day (BID) | ORAL | Status: DC
  Administered 2015-08-05 – 2015-08-08 (×6): 50 mg via ORAL
  Filled 2015-08-05: qty 2
  Filled 2015-08-05 (×6): qty 1

## 2015-08-05 MED ORDER — DOCUSATE SODIUM 100 MG PO CAPS
100.0000 mg | ORAL_CAPSULE | Freq: Two times a day (BID) | ORAL | Status: DC
  Administered 2015-08-05 – 2015-08-08 (×7): 100 mg via ORAL
  Filled 2015-08-05 (×7): qty 1

## 2015-08-05 MED ORDER — ASPIRIN 81 MG PO CHEW
81.0000 mg | CHEWABLE_TABLET | Freq: Every day | ORAL | Status: DC
  Administered 2015-08-05 – 2015-08-08 (×4): 81 mg via ORAL
  Filled 2015-08-05 (×4): qty 1

## 2015-08-05 MED ORDER — IPRATROPIUM BROMIDE 0.02 % IN SOLN: 2.5000 mL | RESPIRATORY_TRACT | Status: DC | PRN

## 2015-08-05 MED ORDER — WARFARIN SODIUM 5 MG PO TABS
8.0000 mg | ORAL_TABLET | Freq: Every day | ORAL | Status: DC
  Administered 2015-08-06: 8 mg via ORAL
  Filled 2015-08-05: qty 1

## 2015-08-05 MED ORDER — ACETAMINOPHEN 325 MG PO TABS
650.0000 mg | ORAL_TABLET | ORAL | Status: DC | PRN
  Administered 2015-08-08: 650 mg via ORAL
  Filled 2015-08-05: qty 2

## 2015-08-05 MED ORDER — ALPRAZOLAM 0.25 MG PO TABS: 0.2500 mg | ORAL_TABLET | Freq: Two times a day (BID) | ORAL | Status: DC | PRN

## 2015-08-05 MED ORDER — WARFARIN - PHARMACIST DOSING INPATIENT: Freq: Every day | Status: DC

## 2015-08-05 MED ORDER — ZOLPIDEM TARTRATE 5 MG PO TABS: 5.0000 mg | ORAL_TABLET | Freq: Every evening | ORAL | Status: DC | PRN

## 2015-08-05 NOTE — Progress Notes (Signed)
Cardiology Office Note   Date:  08/05/2015   ID:  Steven Barnett, DOB 1951-01-30, MRN [id number]  PCP:  Elisabeth Cara, PA-C  Cardiologist:  Dr Marchelle Folks, PA-C   Chief complaint: Shortness of breath  History of Present Illness: Steven Barnett is a 65 y.o. male with a history of perm afib on coumadin, D-CHF, HTN, OSA, morbid obesity.  D/c 01/08 after admission for D-CHF, wt at d/c 264 lbs, 4.5 sec pause on telem, Cardizem d/c'd.  Steven Barnett presents for post hospital follow-up.  Since discharge from the hospital, Steven Barnett has been trying to do better. He is trying to eat a healthier diet. He drinks a great deal of apple juice, stating he goes through a gallon in 2 or 3 days. He is also trying to drink more water. He is eating a lot of fruit, and says he is trying to eat vegetables as well, knowing to rinse them first if they are canned or frozen. However, he also eats foods high in sodium at time and states that he really wants to have some bicarbonate of soda. Yesterday, he ate pizza.  Steven Barnett states that he has been weighing himself. However, he did not wait a day and is not quite sure what his weight yesterday was. He says it has been running between 276 and 279 pounds. He was not aware that this is a significant increase from his hospital discharge weight. He describes lower extremity edema. He describes orthopnea and PND. His dyspnea on exertion has been getting worse, and now he is struggling to walk across the room.  In the office today, he gets short of breath with conversation. When he tried to walk to the bathroom, he had to go very slowly and was almost gasping for breath by the time he got there. His oxygen saturation was checked and at rest it was 90%. He was confused at times and somnolent, raising additional concerns about hypoxia.   Past Medical History  Diagnosis Date  . Hypertension   . Permanent atrial fibrillation ([place]) 2010  . Chronic  anticoagulation 2010    Coumadin  . OSA (obstructive sleep apnea) 2016    Not on CPAP  . Shortness of breath dyspnea   . Chronic diastolic CHF (congestive heart failure), NYHA class 2 [facility])     Past Surgical History  Procedure Laterality Date  . Thoracotomy Right 2010    Current Outpatient Prescriptions  Medication Sig Dispense Refill  . acetaminophen (TYLENOL) 500 MG tablet Take 2,000 mg by mouth 2 (two) times daily.    Marland Kitchen aspirin 81 MG tablet Take 81 mg by mouth daily.    Marland Kitchen diltiazem (CARDIZEM) 120 MG tablet TAKE ONE TABLET BY MOUTH ONCE DAILY KEEP  2  WEEK  FOLLOW  UP  APPOINTMENT  WITH  DR  HILTY 30 tablet 0  . docusate sodium (COLACE) 100 MG capsule Take 1 capsule (100 mg total) by mouth 2 (two) times daily. For constipation 60 capsule 3  . furosemide (LASIX) 40 MG tablet TAKE ONE TABLET BY MOUTH TWICE DAILY 30 tablet 5  . hydrALAZINE (APRESOLINE) 50 MG tablet TAKE ONE TABLET BY MOUTH TWICE DAILY 60 tablet 0  . ipratropium (ATROVENT HFA) 17 MCG/ACT inhaler Inhale 2 puffs into the lungs as needed for wheezing.    Marland Kitchen lisinopril (PRINIVIL,ZESTRIL) 20 MG tablet TAKE ONE TABLET BY MOUTH ONCE DAILY 30 tablet 11  . metoprolol (LOPRESSOR) 25 MG tablet Take 2 tablets (  50 mg total) by mouth 2 (two) times daily. 60 tablet 5  . potassium chloride (K-DUR,KLOR-CON) 20 MEQ tablet Take 1 tablet (20 mEq total) by mouth daily. 30 tablet 3  . sildenafil (REVATIO) 20 MG tablet TAKE 2 TO 3 TABLETS BY MOUTH ONCE DAILY AS NEEDED FOR SEXUAL ACTIVITY 30 tablet 0  . warfarin (COUMADIN) 4 MG tablet Take 2 tablets by mouth daily or as directed by coumadin clinic 60 tablet 1   No current facility-administered medications for this visit.    Allergies:   Review of patient's allergies indicates no known allergies.    Social History:  The patient  reports that he quit smoking about 40 years ago. He has never used smokeless tobacco. He reports that he does not drink alcohol or use illicit drugs.   Family  History:  The patient's family history includes Diabetes in his mother; Heart disease in his father; Heart failure in his father; Hyperlipidemia in his mother; Hypertension in his mother; Stroke in his maternal grandmother and sister. There is no history of Heart attack.    ROS:  Please see the history of present illness. All other systems are reviewed and negative.    PHYSICAL EXAM: VS:  BP 170/100 mmHg  Pulse 80  Ht 5\' 11"  (1.803 m)  Wt 288 lb 6.4 oz (130.817 kg)  BMI 40.24 kg/m2 , BMI Body mass index is 40.24 kg/(m^2). GEN: Well nourished, well developed, male in no moderate respiratory distress HEENT: normal for age  Neck: JVD 10 cm, positive hepatojugular reflux, no carotid bruit, no masses Cardiac:Irregular rate and rhythm; no murmur, no rubs, or gallops Respiratory:  Rales bases bilaterally, right greater than left,increasedl work of breathing GI: soft, nontender, nondistended, + BS MS: no deformity or atrophy;1-2 plus  edema; distal pulses are 2+ in all 4 extremities  Skin: warm and dry, no rash Neuro:  Strength and sensation are intact   EKG:  EKG is ordered today. The ekg ordered today demonstrates atrial fibrillation, rate is controlled in no acute ischemic changes   Recent Labs: 07/08/2015: B Natriuretic Peptide 569.0*; Magnesium 1.9 07/12/2015: BUN 17; Creatinine, Ser 0.90; Hemoglobin 10.8*; Platelets 235; Potassium 4.2; Sodium 134*    Lipid Panel    Component Value Date/Time   CHOL  11/20/2008 0420    93        ATP III CLASSIFICATION:  <200     mg/dL   Desirable  200-239  mg/dL   Borderline High  >=240    mg/dL   High          TRIG 31 11/20/2008 0420   HDL 42 11/20/2008 0420   CHOLHDL 2.2 11/20/2008 0420   VLDL 6 11/20/2008 0420   LDLCALC  11/20/2008 0420    45        Total Cholesterol/HDL:CHD Risk Coronary Heart Disease Risk Table                     Men   Women  1/2 Average Risk   3.4   3.3  Average Risk       5.0   4.4  2 X Average Risk   9.6   7.1   3 X Average Risk  23.4   11.0        Use the calculated Patient Ratio above and the CHD Risk Table to determine the patient's CHD Risk.        ATP III CLASSIFICATION (LDL):  <100  mg/dL   Optimal  100-129  mg/dL   Near or Above                    Optimal  130-159  mg/dL   Borderline  160-189  mg/dL   High  >190     mg/dL   Very High     Wt Readings from Last 3 Encounters:  08/05/15 288 lb 6.4 oz (130.817 kg)  07/12/15 264 lb 1.6 oz (119.795 kg)  07/08/15 278 lb (126.1 kg)     Other studies Reviewed: Additional studies/ records that were reviewed today include: Previous office notes and hospital records.  ASSESSMENT AND PLAN:  1.  Acute on chronic diastolic CHF: His weight at discharge is 264 pounds. He is a 24 pounds from this. He admits to dietary indiscretions and he may be over drinking as well. He states he is compliant with his medications and is trying to do better. He sincerely struggles with this. He is significantly volume over loaded and hypoxic in the office. We will not be able to treat this as an outpatient and hospital admission is indicated.   Dr. Gwenlyn Found was in to see Steven Barnett, and agrees that hospital admission is indicated.  Steven Barnett was reluctant to do this, but eventually agreed to go. He is not safe to drive and the EMS been contacted. He needs admission for diuresis. He needs additional education regarding heart healthy low sodium foods. He was advised that he could not have sodium bicarbonate. He does not seem to clearly understand the link between poor dietary habits and volume overload.   2. Atrial fibrillation: It is chronic and his rate is controlled, no changes.   Current medicines are reviewed at length with the patient today.  The patient does not have concerns regarding medicines.  The following changes have been made:  no change at this time  Labs/ tests ordered today include:   Orders Placed This Encounter  Procedures  . EKG 12-Lead      Disposition:   FU with Dr. [name]   Signed, Lenoard Aden  08/05/2015 9:23 AM    [place] [place], [place], [place]  [address] Phone: [phone]; Fax: [phone]

## 2015-08-05 NOTE — Progress Notes (Signed)
RN called RT to patient room due to MD ordering bipap for patient.  Patient is currently tolerating bipap well.  Will continue to monitor.

## 2015-08-05 NOTE — ED Provider Notes (Signed)
CSN: [id number]     Arrival date & time 08/05/15  1024 History   First MD Initiated Contact with Patient 08/05/15 1122     Chief Complaint  Patient presents with  . Shortness of Breath   HPI The patient presented to the emergency room for admission to the hospital for congestive heart failure.  Patient has a history of obstructive sleep apnea and congestive heart failure.  Patient states he started having trouble with his breathing yesterday. He went to see his cardiologist today. The office he was noted to be short of breath. He felt that his symptoms were related to a CHF exacerbation and he was sent to the emergency room to be admitted to the hospital because there were no beds immediately available. Patient denies any trouble with fevers. He is not having any chest pain. He does feel listless. Past Medical History  Diagnosis Date  . Hypertension   . Permanent atrial fibrillation ([place]) 2010  . Chronic anticoagulation 2010    Coumadin  . OSA (obstructive sleep apnea) 2016    Not on CPAP  . Shortness of breath dyspnea   . Chronic diastolic CHF (congestive heart failure), NYHA class 2 [facility])    Past Surgical History  Procedure Laterality Date  . Thoracotomy Right 2010   Family History  Problem Relation Age of Onset  . Hypertension Mother   . Diabetes Mother   . Hyperlipidemia Mother   . Heart disease Father   . Heart failure Father   . Heart attack Neg Hx   . Stroke Maternal Grandmother   . Stroke Sister    Social History  Substance Use Topics  . Smoking status: Former Smoker    Quit date: 12/16/1974  . Smokeless tobacco: Never Used  . Alcohol Use: No    Review of Systems  All other systems reviewed and are negative.     Allergies  Review of patient's allergies indicates no known allergies.  Home Medications   Prior to Admission medications   Medication Sig Start Date End Date Taking? Authorizing Provider  acetaminophen (TYLENOL) 500 MG tablet Take 2,000 mg by  mouth 2 (two) times daily.    Historical Provider, MD  aspirin 81 MG tablet Take 81 mg by mouth daily.    Historical Provider, MD  diltiazem (CARDIZEM) 120 MG tablet TAKE ONE TABLET BY MOUTH ONCE DAILY KEEP  2  WEEK  FOLLOW  UP  APPOINTMENT  WITH  DR  HILTY 07/20/15   Pixie Casino, MD  docusate sodium (COLACE) 100 MG capsule Take 1 capsule (100 mg total) by mouth 2 (two) times daily. For constipation 07/12/15   Ripudeep Krystal Eaton, MD  furosemide (LASIX) 40 MG tablet TAKE ONE TABLET BY MOUTH TWICE DAILY 06/30/15   Pixie Casino, MD  hydrALAZINE (APRESOLINE) 50 MG tablet TAKE ONE TABLET BY MOUTH TWICE DAILY 07/28/15   Pixie Casino, MD  ipratropium (ATROVENT HFA) 17 MCG/ACT inhaler Inhale 2 puffs into the lungs as needed for wheezing.    Historical Provider, MD  lisinopril (PRINIVIL,ZESTRIL) 20 MG tablet TAKE ONE TABLET BY MOUTH ONCE DAILY 04/20/15   Pixie Casino, MD  metoprolol (LOPRESSOR) 25 MG tablet Take 2 tablets (50 mg total) by mouth 2 (two) times daily. 02/10/15   Brett Canales, PA-C  potassium chloride (K-DUR,KLOR-CON) 20 MEQ tablet Take 1 tablet (20 mEq total) by mouth daily. 07/13/15   Ripudeep Krystal Eaton, MD  sildenafil (REVATIO) 20 MG tablet TAKE 2 TO 3  TABLETS BY MOUTH ONCE DAILY AS NEEDED FOR SEXUAL ACTIVITY 07/28/15   Pixie Casino, MD  warfarin (COUMADIN) 4 MG tablet Take 2 tablets by mouth daily or as directed by coumadin clinic 08/05/15   Casimiro Needle [place], RPH-CPP   BP 145/98 mmHg  Pulse 59  Temp(Src) 98.1 F (36.7 C) (Oral)  Resp 24  Ht 5\' 10"  (1.778 m)  Wt 130.636 kg  BMI 41.32 kg/m2  SpO2 100% Physical Exam  Constitutional: He appears well-nourished. No distress.  HENT:  Head: Normocephalic and atraumatic.  Right Ear: External ear normal.  Left Ear: External ear normal.  Eyes: Conjunctivae are normal. Right eye exhibits no discharge. Left eye exhibits no discharge. No scleral icterus.  Neck: Neck supple. JVD present. Tracheal deviation present.  Cardiovascular: Normal rate,  regular rhythm and intact distal pulses.   Pulmonary/Chest: Accessory muscle usage present. No stridor. Tachypnea noted. No respiratory distress. He has no wheezes. He has rales.  Abdominal: Soft. Bowel sounds are normal. He exhibits no distension. There is no tenderness. There is no rebound and no guarding.  Musculoskeletal: He exhibits edema. He exhibits no tenderness.  Neurological: He is alert. He has normal strength. No cranial nerve deficit (no facial droop, extraocular movements intact, no slurred speech) or sensory deficit. He exhibits normal muscle tone. He displays no seizure activity. Coordination normal.  Skin: Skin is warm and dry. No rash noted.  Psychiatric: He has a normal mood and affect.  Nursing note and vitals reviewed.   ED Course  Procedures (including critical care time) Labs Review Labs Reviewed  CBC - Abnormal; Notable for the following:    Hemoglobin 9.4 (*)    HCT 32.3 (*)    MCV 70.4 (*)    MCH 20.5 (*)    MCHC 29.1 (*)    RDW 21.7 (*)    All other components within normal limits  PROTIME-INR - Abnormal; Notable for the following:    Prothrombin Time 24.6 (*)    INR 2.24 (*)    All other components within normal limits  I-STAT VENOUS BLOOD GAS, ED - Abnormal; Notable for the following:    pH, Ven 7.493 (*)    pCO2, Ven 36.7 (*)    Bicarbonate 28.2 (*)    Acid-Base Excess 5.0 (*)    All other components within normal limits  BASIC METABOLIC PANEL  BRAIN NATRIURETIC PEPTIDE  BLOOD GAS, ARTERIAL  I-STAT TROPOININ, ED    Imaging Review Dg Chest Portable 1 View  08/05/2015  CLINICAL DATA:  Onset 2 weeks ago with intermittent shortness of breath. Worsening on exertion. EXAM: PORTABLE CHEST 1 VIEW COMPARISON:  07/08/2015 FINDINGS: Bilateral interstitial and alveolar airspace opacities. Small right pleural effusion. No focal consolidation or pneumothorax. Stable cardiomegaly. No acute osseous abnormality. IMPRESSION: Findings most concerning for CHF.  Electronically Signed   By: Kathreen Devoid   On: 08/05/2015 11:01   I have personally reviewed and evaluated these images and lab results as part of my medical decision-making.   EKG Interpretation   Date/Time:  Wednesday August 05 2015 10:34:05 EST Ventricular Rate:  76 PR Interval:    QRS Duration: 99 QT Interval:  435 QTC Calculation: 489 R Axis:   -28 Text Interpretation:  Atrial fibrillation Ventricular premature complex  Borderline left axis deviation Anteroseptal infarct, old Baseline wander  in lead(s) V4 No significant change since last tracing Confirmed by Ellison Leisure   MD-J, Sunny Gains [id number]) on 08/05/2015 10:48:24 AM      MDM  Final diagnoses:  Acute on chronic congestive heart failure, unspecified congestive heart failure type ([place])    Patient's laboratory tests show anemia with a stable hemoglobin.  His blood gas which is most likely venous does not show evidence of hypercarbia. INR is therapeutic. Chest x-ray is suggestive of congestive heart failure.  Patient was given a dose of Lasix. I ordered BiPAP to help him with his work of breathing but he is not hypoxic or hypercapnic  Plan on admission for further treatment    Dorie Rank, MD 08/05/15 1241

## 2015-08-05 NOTE — Progress Notes (Signed)
ABG ordered for patient however venous sample was obtained.  Results given to MD.  Will go by venous blood gas results at this time.    Ref. Range 08/05/2015 12:11  Sample type Unknown VENOUS  pH, Ven Latest Ref Range: 7.250-7.300  7.493 (H)  pCO2, Ven Latest Ref Range: 45.0-50.0 mmHg 36.7 (L)  pO2, Ven Latest Ref Range: 30.0-45.0 mmHg 34.0  Bicarbonate Latest Ref Range: 20.0-24.0 mEq/L 28.2 (H)  TCO2 Latest Ref Range: 0-100 mmol/L 29  Acid-Base Excess Latest Ref Range: 0.0-2.0 mmol/L 5.0 (H)  O2 Saturation Latest Units: % 73.0  Patient temperature Unknown 98.1 F  Collection site Unknown RADIAL, [facility] T.Marland KitchenMarland Kitchen

## 2015-08-05 NOTE — H&P (Signed)
Cardiology History and Physical   Date:  08/05/2015   ID:  Steven Barnett, DOB [birth year], MRN [id number]  PCP:  Elisabeth Cara, PA-C  Cardiologist:  Dr Marchelle Folks, PA-C   Chief complaint: Shortness of breath  History of Present Illness: Steven Barnett is a 65 y.o. male with a history of perm afib on coumadin, D-CHF, HTN, OSA, morbid obesity.  D/c 01/08 after admission for D-CHF, wt at d/c 264 lbs, 4.5 sec pause on telem, Cardizem d/c'd.  Steven Barnett presents for post hospital follow-up.  Since discharge from the hospital, Steven Barnett has been trying to do better. He is trying to eat a healthier diet. He drinks a great deal of apple juice, stating he goes through a gallon in 2 or 3 days. He is also trying to drink more water. He is eating a lot of fruit, and says he is trying to eat vegetables as well, knowing to rinse them first if they are canned or frozen. However, he also eats foods high in sodium at time and states that he really wants to have some bicarbonate of soda. Yesterday, he ate pizza.  Steven Barnett states that he has been weighing himself. However, he did not wait a day and is not quite sure what his weight yesterday was. He says it has been running between 276 and 279 pounds. He was not aware that this is a significant increase from his hospital discharge weight. He describes lower extremity edema. He describes orthopnea and PND. His dyspnea on exertion has been getting worse, and now he is struggling to walk across the room.  In the office today, he gets short of breath with conversation. When he tried to walk to the bathroom, he had to go very slowly and was almost gasping for breath by the time he got there. His oxygen saturation was checked and at rest it was 90%. He was confused at times and somnolent, raising additional concerns about hypoxia.   Past Medical History  Diagnosis Date  . Hypertension   . Permanent atrial fibrillation ([place]) 2010  . Chronic  anticoagulation 2010    Coumadin  . OSA (obstructive sleep apnea) 2016    Not on CPAP  . Shortness of breath dyspnea   . Chronic diastolic CHF (congestive heart failure), NYHA class 2 [facility])     Past Surgical History  Procedure Laterality Date  . Thoracotomy Right 2010    Current Outpatient Prescriptions  Medication Sig Dispense Refill  . acetaminophen (TYLENOL) 500 MG tablet Take 2,000 mg by mouth 2 (two) times daily.    Marland Kitchen aspirin 81 MG tablet Take 81 mg by mouth daily.    Marland Kitchen diltiazem (CARDIZEM) 120 MG tablet TAKE ONE TABLET BY MOUTH ONCE DAILY KEEP  2  WEEK  FOLLOW  UP  APPOINTMENT  WITH  DR  HILTY 30 tablet 0  . docusate sodium (COLACE) 100 MG capsule Take 1 capsule (100 mg total) by mouth 2 (two) times daily. For constipation 60 capsule 3  . furosemide (LASIX) 40 MG tablet TAKE ONE TABLET BY MOUTH TWICE DAILY 30 tablet 5  . hydrALAZINE (APRESOLINE) 50 MG tablet TAKE ONE TABLET BY MOUTH TWICE DAILY 60 tablet 0  . ipratropium (ATROVENT HFA) 17 MCG/ACT inhaler Inhale 2 puffs into the lungs as needed for wheezing.    Marland Kitchen lisinopril (PRINIVIL,ZESTRIL) 20 MG tablet TAKE ONE TABLET BY MOUTH ONCE DAILY 30 tablet 11  . metoprolol (LOPRESSOR) 25 MG tablet Take 2  tablets (50 mg total) by mouth 2 (two) times daily. 60 tablet 5  . potassium chloride (K-DUR,KLOR-CON) 20 MEQ tablet Take 1 tablet (20 mEq total) by mouth daily. 30 tablet 3  . sildenafil (REVATIO) 20 MG tablet TAKE 2 TO 3 TABLETS BY MOUTH ONCE DAILY AS NEEDED FOR SEXUAL ACTIVITY 30 tablet 0  . warfarin (COUMADIN) 4 MG tablet Take 2 tablets by mouth daily or as directed by coumadin clinic 60 tablet 1   No current facility-administered medications for this visit.    Allergies:   Review of patient's allergies indicates no known allergies.    Social History:  The patient  reports that he quit smoking about 40 years ago. He has never used smokeless tobacco. He reports that he does not drink alcohol or use illicit drugs. He is  unemployed  Family History:  The patient's family history includes Diabetes in his mother; Heart disease in his father; Heart failure in his father; Hyperlipidemia in his mother; Hypertension in his mother; Stroke in his maternal grandmother and sister. There is no history of Heart attack.   Family Status: Pt indicated that his mother is deceased. He indicated that his father is deceased.    ROS:  Please see the history of present illness. All other systems are reviewed and negative.    PHYSICAL EXAM: VS:  BP 170/100 mmHg  Pulse 80  Ht 5\' 11"  (1.803 m)  Wt 288 lb 6.4 oz (130.817 kg)  BMI 40.24 kg/m2 , BMI Body mass index is 40.24 kg/(m^2). GEN: Well nourished, well developed, male in no moderate respiratory distress HEENT: normal for age  Neck: JVD 10 cm, positive hepatojugular reflux, no carotid bruit, no masses Cardiac:Irregular rate and rhythm; no murmur, no rubs, or gallops Respiratory:  Rales bases bilaterally, right greater than left,increasedl work of breathing GI: soft, nontender, nondistended, + BS MS: no deformity or atrophy;1-2 plus  edema; distal pulses are 2+ in all 4 extremities  Skin: warm and dry, no rash Neuro:  Strength and sensation are intact   EKG:  EKG is ordered today. The ekg ordered today demonstrates atrial fibrillation, rate is controlled in no acute ischemic changes   Recent Labs: 07/08/2015: B Natriuretic Peptide 569.0*; Magnesium 1.9 07/12/2015: BUN 17; Creatinine, Ser 0.90; Hemoglobin 10.8*; Platelets 235; Potassium 4.2; Sodium 134*    Lipid Panel    Component Value Date/Time   CHOL  11/20/2008 0420    93        ATP III CLASSIFICATION:  <200     mg/dL   Desirable  200-239  mg/dL   Borderline High  >=240    mg/dL   High          TRIG 31 11/20/2008 0420   HDL 42 11/20/2008 0420   CHOLHDL 2.2 11/20/2008 0420   VLDL 6 11/20/2008 0420   LDLCALC  11/20/2008 0420    45        Total Cholesterol/HDL:CHD Risk Coronary Heart Disease Risk Table                      Men   Women  1/2 Average Risk   3.4   3.3  Average Risk       5.0   4.4  2 X Average Risk   9.6   7.1  3 X Average Risk  23.4   11.0        Use the calculated Patient Ratio above and the CHD Risk Table to determine  the patient's CHD Risk.        ATP III CLASSIFICATION (LDL):  <100     mg/dL   Optimal  100-129  mg/dL   Near or Above                    Optimal  130-159  mg/dL   Borderline  160-189  mg/dL   High  >190     mg/dL   Very High     Wt Readings from Last 3 Encounters:  08/05/15 288 lb 6.4 oz (130.817 kg)  07/12/15 264 lb 1.6 oz (119.795 kg)  07/08/15 278 lb (126.1 kg)     Other studies Reviewed: Additional studies/ records that were reviewed today include: Previous office notes and hospital records.  ASSESSMENT AND PLAN:  1.  Acute on chronic diastolic CHF: His weight at discharge is 264 pounds. He is a 24 pounds from this. He admits to dietary indiscretions and he may be over drinking as well. He states he is compliant with his medications and is trying to do better. He sincerely struggles with this. He is significantly volume over loaded and hypoxic in the office. We will not be able to treat this as an outpatient and hospital admission is indicated.   Dr. Gwenlyn Found was in to see Steven Barnett, and agrees that hospital admission is indicated.  Steven Barnett was reluctant to do this, but eventually agreed to go. He is not safe to drive and the EMS been contacted. He needs admission for diuresis. He needs additional education regarding heart healthy low sodium foods. He was advised that he could not have sodium bicarbonate. He does not seem to clearly understand the link between poor dietary habits and volume overload.   2. Atrial fibrillation: It is chronic and his rate is controlled, no changes.   Current medicines are reviewed at length with the patient today.  The patient does not have concerns regarding medicines.  The following changes have been made:  no  change at this time  Labs/ tests ordered today include:   Orders Placed This Encounter  Procedures  . EKG 12-Lead     Disposition:   FU with Dr. [name]   Signed, Lenoard Aden  08/05/2015 9:23 AM    [facility] [place], [place], [place]  [address] Phone: [phone]; Fax: [phone]

## 2015-08-05 NOTE — Progress Notes (Addendum)
ANTICOAGULATION CONSULT NOTE - Initial Consult  Pharmacy Consult for warfarin Indication: atrial fibrillation  No Known Allergies  Patient Measurements: Height: 5\' 10"  (177.8 cm) Weight: 288 lb (130.636 kg) IBW/kg (Calculated) : 73  Vital Signs: Temp: 98.1 F (36.7 C) (02/01 1037) Temp Source: Oral (02/01 1037) BP: 147/101 mmHg (02/01 1132) Pulse Rate: 70 (02/01 1132)  Labs:  Recent Labs  08/05/15 0825 08/05/15 1055  HGB  --  9.4*  HCT  --  32.3*  PLT  --  169  LABPROT  --  24.6*  INR 2.4 2.24*    CrCl cannot be calculated (Patient has no serum creatinine result on file.).   Medical History: Past Medical History  Diagnosis Date  . Hypertension   . Permanent atrial fibrillation ([place]) 2010  . Chronic anticoagulation 2010    Coumadin  . OSA (obstructive sleep apnea) 2016    Not on CPAP  . Shortness of breath dyspnea   . Chronic diastolic CHF (congestive heart failure), NYHA class 2 (HCC)    Assessment: 76 yom presented to the ED with SOB. He is on chronic coumadin for history of afib. INR is therapeutic today. H/H is slightly low and platelets are WNL. No bleeding noted. He already took his dose today.   PTA dose: warfarin 8mg  daily    Goal of Therapy:  INR 2-3 Monitor platelets by anticoagulation protocol: Yes   Plan:  - Warfarin 8mg  PO daily starting tomorrow - Daily INR  Beverly Ferner, Rande Lawman 08/05/2015,11:42 AM

## 2015-08-05 NOTE — ED Notes (Signed)
EKG completed given to EDP notified of patient symptoms.

## 2015-08-05 NOTE — ED Notes (Signed)
Onset 2 weeks ago intermittent shortness of breath worsening on exertion.  Seen at cardiologist office EKG completed sent with EMS a-fib with history of same. Sent to ED for evaluation and admission.  Patient drove to the doctor's office and states tired.

## 2015-08-05 NOTE — Patient Instructions (Signed)
Pt sent via EMS to [facility] hospital for eval

## 2015-08-05 NOTE — Progress Notes (Signed)
Patient taken off of bipap and placed on 2L nasal cannula.  Patient sats currently 100%.  Vitals are stable at this time.  Will continue to monitor.

## 2015-08-06 DIAGNOSIS — I110 Hypertensive heart disease with heart failure: Principal | ICD-10-CM

## 2015-08-06 DIAGNOSIS — E785 Hyperlipidemia, unspecified: Secondary | ICD-10-CM

## 2015-08-06 DIAGNOSIS — I482 Chronic atrial fibrillation: Secondary | ICD-10-CM

## 2015-08-06 LAB — BASIC METABOLIC PANEL
ANION GAP: 9 (ref 5–15)
BUN: 12 mg/dL (ref 6–20)
CO2: 31 mmol/L (ref 22–32)
Calcium: 8.6 mg/dL — ABNORMAL LOW (ref 8.9–10.3)
Chloride: 99 mmol/L — ABNORMAL LOW (ref 101–111)
Creatinine, Ser: 0.93 mg/dL (ref 0.61–1.24)
Glucose, Bld: 75 mg/dL (ref 65–99)
Potassium: 4.2 mmol/L (ref 3.5–5.1)
Sodium: 139 mmol/L (ref 135–145)

## 2015-08-06 LAB — PROTIME-INR
INR: 2.1 — AB (ref 0.00–1.49)
PROTHROMBIN TIME: 23.4 s — AB (ref 11.6–15.2)

## 2015-08-06 NOTE — Progress Notes (Signed)
ANTICOAGULATION CONSULT NOTE - Follow Up Consult  Pharmacy Consult for Coumadin Indication: atrial fibrillation  No Known Allergies  Patient Measurements: Height: 5\' 10"  (177.8 cm) Weight: 273 lb (123.832 kg) (scale c) IBW/kg (Calculated) : 73  Vital Signs: Temp: 97 F (36.1 C) (02/02 1300) Temp Source: Oral (02/02 1300) BP: 120/60 mmHg (02/02 1300) Pulse Rate: 65 (02/02 1300)  Labs:  Recent Labs  08/05/15 0825 08/05/15 1055 08/06/15 0440  HGB  --  9.4*  --   HCT  --  32.3*  --   PLT  --  169  --   LABPROT  --  24.6* 23.4*  INR 2.4 2.24* 2.10*  CREATININE  --  0.98 0.93    Estimated Creatinine Clearance: 107.3 mL/min (by C-G formula based on Cr of 0.93).  Assessment:   Continues on Coumadin for afib as prior to admission.   INR remains therapeutic at 2.10.  Goal of Therapy:  INR 2-3 Monitor platelets by anticoagulation protocol: Yes   Plan:   Continue Coumadin 8 mg daily as per home regimen.  Daily PT/INR for now.  Arty Baumgartner, [place] Pager: [phone] 08/06/2015,3:37 PM

## 2015-08-06 NOTE — Progress Notes (Signed)
RT placed on CPAP of 7. O2 bleed in of 2L. Patient tolerating well. RT will continue to monitor as needed.

## 2015-08-06 NOTE — Progress Notes (Signed)
Patient Profile: 65 y.o. male with PMH of chronic atrial fibrillation on Coumadin, chronic hypertension, chronic diastolic CHF, obesity, and obstructive sleep apnea who was recently admitted 1/4-07/12/15 for acute on chronic diastolic CHF (echo [id number] showed EF of 55-60%). He was diuresed and discharge weight was 264 lb. He was initially scheduled for post hospital f/u on 1/19 but canceled. He presented to our office 08/05/15 for f/u and was found to be volume overloaded with acute CHF and highly symptomatic. Office weight up to 288 lb. He was admitted for IV diuretics. He was re weighed on arrival. Hospital weight was 274 lb.    Subjective: No additional complaints. Asymptomatic resting. Hasn't ambulated much since admission. He has a dry cough.   Objective: Vital signs in last 24 hours: Temp:  [97 F (36.1 C)-98.3 F (36.8 C)] 97 F (36.1 C) (02/02 0708) Pulse Rate:  [59-86] 62 (02/02 0708) Resp:  [10-27] 18 (02/02 0708) BP: (113-161)/(66-109) 115/66 mmHg (02/02 0708) SpO2:  [95 %-100 %] 100 % (02/02 0708) FiO2 (%):  [30 %] 30 % (02/01 1537) Weight:  [273 lb (123.832 kg)-288 lb (130.636 kg)] 273 lb (123.832 kg) (02/02 0500) Last BM Date: 08/05/15  Intake/Output from previous day: 02/01 0701 - 02/02 0700 In: 222 [P.O.:222] Out: 4500 [Urine:4500] Intake/Output this shift: Total I/O In: 360 [P.O.:360] Out: 0   Medications Current Facility-Administered Medications  Medication Dose Route Frequency Provider Last Rate Last Dose  . 0.9 %  sodium chloride infusion  250 mL Intravenous PRN Rhonda G Barrett, PA-C      . acetaminophen (TYLENOL) tablet 650 mg  650 mg Oral Q4H PRN Rhonda G Barrett, PA-C      . ALPRAZolam Duanne Moron) tablet 0.25 mg  0.25 mg Oral BID PRN Evelene Croon Barrett, PA-C      . aspirin chewable tablet 81 mg  81 mg Oral Daily Rhonda G Barrett, PA-C   81 mg at 08/05/15 1709  . diltiazem (CARDIZEM CD) 24 hr capsule 120 mg  120 mg Oral Daily Rhonda G Barrett, PA-C   120 mg at  08/05/15 1713  . docusate sodium (COLACE) capsule 100 mg  100 mg Oral BID Evelene Croon Barrett, PA-C   100 mg at 08/05/15 2300  . furosemide (LASIX) injection 40 mg  40 mg Intravenous BID Evelene Croon Barrett, PA-C   40 mg at 08/06/15 0859  . hydrALAZINE (APRESOLINE) tablet 50 mg  50 mg Oral BID Evelene Croon Barrett, PA-C   50 mg at 08/05/15 2300  . ipratropium (ATROVENT) nebulizer solution 0.5 mg  2.5 mL Inhalation Q4H PRN Rhonda G Barrett, PA-C      . lisinopril (PRINIVIL,ZESTRIL) tablet 20 mg  20 mg Oral Daily Rhonda G Barrett, PA-C   20 mg at 08/05/15 1715  . metoprolol tartrate (LOPRESSOR) tablet 50 mg  50 mg Oral BID Evelene Croon Barrett, PA-C   50 mg at 08/05/15 2300  . ondansetron (ZOFRAN) injection 4 mg  4 mg Intravenous Q6H PRN Rhonda G Barrett, PA-C      . potassium chloride SA (K-DUR,KLOR-CON) CR tablet 20 mEq  20 mEq Oral BID Rhonda G Barrett, PA-C   20 mEq at 08/05/15 2300  . sodium chloride flush (NS) 0.9 % injection 3 mL  3 mL Intravenous Q12H Rhonda G Barrett, PA-C   3 mL at 08/05/15 2300  . sodium chloride flush (NS) 0.9 % injection 3 mL  3 mL Intravenous PRN Rhonda G Barrett, PA-C      .  warfarin (COUMADIN) tablet 8 mg  8 mg Oral q1800 Valeda Malm Rumbarger, RPH      . Warfarin - Pharmacist Dosing Inpatient   Does not apply q1800 Rachel L Rumbarger, RPH      . zolpidem (AMBIEN) tablet 5 mg  5 mg Oral QHS PRN,MR X 1 Rhonda G Barrett, PA-C        PE: General appearance: alert, cooperative, no distress and moderately obese Neck: elevated JVD no masse or bruits Lungs: faint bibasilar rales, rhonchi over the right lung base Heart: irregularly irregular rhythm and regular rate, no MRG Abdomen: distended. + BS Pulses: 2+ and symmetric Skin: warm and dry Neurologic: Grossly normal  Lab Results:   Recent Labs  08/05/15 1055  WBC 4.6  HGB 9.4*  HCT 32.3*  PLT 169   BMET  Recent Labs  08/05/15 1055 08/06/15 0440  NA 140 139  K 4.0 4.2  CL 101 99*  CO2 29 31  GLUCOSE 86 75  BUN 12  12  CREATININE 0.98 0.93  CALCIUM 9.0 8.6*   PT/INR  Recent Labs  08/05/15 0825 08/05/15 1055 08/06/15 0440  LABPROT  --  24.6* 23.4*  INR 2.4 2.24* 2.10*   I/O last 3 completed shifts: In: 222 [P.O.:222] Out: 4500 [Urine:4500] Total I/O In: 360 [P.O.:360] Out: 0   Filed Weights   08/05/15 1037 08/05/15 1810 08/06/15 0500  Weight: 288 lb (130.636 kg) 274 lb 6.4 oz (124.467 kg) 273 lb (123.832 kg)    Assessment/Plan  Active Problems:   Acute on chronic diastolic CHF (congestive heart failure), NYHA class 4 (HCC)     1. Acute on Chronic Diastolic CHF: EF known to be 55-60%. There is a big discrepancy between his office weight and hospital admission weight yesterday. 288 vs 274 lb. Clothing may have factored in. Regardless, his dry weight is 264 lb and he remains volume overloaded in the setting of recent dietary indiscretion with sodium. He has had a good initial response to IV lasix overnight. -4.5L out. Weight today is listed at 273 lb. ? Accuracy. Renal function, BP and K are all stable/WNL. Continue IV lasix for further diuresis. Attempt to get him back down to his dry weight of 264. Continue low sodium diet, daily weights (use standing scale) and strict I/Os. Daily BMPs to monitor renal function and K closely. Given his quick bounce-back from poor chronic disease management at home, will need to reinforce recommendations/ re-educate on living with heart failure.   2. Chronic Atrial Fibrillation: HR is controlled. INR is therapeutic. Continue Cardizem and metoprolol.   3. HTN: BP is well controlled. Continue Cardizem, metoprolol, lisinopril and hydralazine     LOS: 1 day    Brittainy M. Rosita Fire, PA-C 08/06/2015 8:59 AM

## 2015-08-06 NOTE — Progress Notes (Signed)
Placed pt on Cpap 7.0 Cm H20 with 2 lpm bled in.  Tolerating well at this time. Rt will monitor.

## 2015-08-06 NOTE — Care Management Note (Signed)
Case Management Note  Patient Details  Name: Kenneth Slatten MRN: [id number] Date of Birth: December 30, 1950  Subjective/Objective:                    Action/Plan: Patient was admitted with CHF. Recent CHF admission. Will follow for discharge needs.  Expected Discharge Date:                  Expected Discharge Plan:     In-House Referral:     Discharge planning Services     Post Acute Care Choice:    Choice offered to:     DME Arranged:    DME Agency:     HH Arranged:    HH Agency:     Status of Service:  In process, will continue to follow  Medicare Important Message Given:    Date Medicare IM Given:    Medicare IM give by:    Date Additional Medicare IM Given:    Additional Medicare Important Message give by:     If discussed at [place] of Stay Meetings, dates discussed:    Additional Comments:  Rolm Baptise, RN 08/06/2015, 3:48 PM [phone]

## 2015-08-06 NOTE — Discharge Instructions (Signed)

## 2015-08-07 LAB — BASIC METABOLIC PANEL WITH GFR
Anion gap: 7 (ref 5–15)
BUN: 15 mg/dL (ref 6–20)
CO2: 31 mmol/L (ref 22–32)
Calcium: 8.8 mg/dL — ABNORMAL LOW (ref 8.9–10.3)
Chloride: 101 mmol/L (ref 101–111)
Creatinine, Ser: 1.01 mg/dL (ref 0.61–1.24)
GFR calc Af Amer: >60 mL/min
GFR calc non Af Amer: >60 mL/min
Glucose, Bld: 88 mg/dL (ref 65–99)
Potassium: 4 mmol/L (ref 3.5–5.1)
Sodium: 139 mmol/L (ref 135–145)

## 2015-08-07 LAB — PROTIME-INR
INR: 1.89 — ABNORMAL HIGH (ref 0.00–1.49)
Prothrombin Time: 21.6 s — ABNORMAL HIGH (ref 11.6–15.2)

## 2015-08-07 MED ORDER — WARFARIN SODIUM 10 MG PO TABS
10.0000 mg | ORAL_TABLET | Freq: Once | ORAL | Status: AC
  Administered 2015-08-07: 10 mg via ORAL
  Filled 2015-08-07: qty 1

## 2015-08-07 MED ORDER — FUROSEMIDE 40 MG PO TABS: 60.0000 mg | ORAL_TABLET | Freq: Two times a day (BID) | ORAL | Status: DC

## 2015-08-07 MED ORDER — WARFARIN SODIUM 5 MG PO TABS: 8.0000 mg | ORAL_TABLET | Freq: Every day | ORAL | Status: DC

## 2015-08-07 MED ORDER — FUROSEMIDE 80 MG PO TABS
80.0000 mg | ORAL_TABLET | Freq: Two times a day (BID) | ORAL | Status: DC
  Administered 2015-08-07 – 2015-08-08 (×2): 80 mg via ORAL
  Filled 2015-08-07 (×2): qty 1

## 2015-08-07 NOTE — Progress Notes (Signed)
I witnessed and cosign medication administration by Particia Nearing Student Nurse.

## 2015-08-07 NOTE — Progress Notes (Signed)
Patient is active with [facility] for [facility] as prior to admission; B Tamera Punt RN,MHA,BSN

## 2015-08-07 NOTE — Clinical Documentation Improvement (Signed)
Cardiology  Can the diagnosis of anemia be further specified? Please document response in next progress note NOT in BPA drop down box. Thanks.   Iron deficiency Anemia  Nutritional anemia, including the nutrition or mineral deficits  Chronic Blood Loss Anemia, including the suspected or known cause  Anemia of chronic disease, including the associated chronic disease state  Other  Clinically Undetermined  Document any associated diagnoses/conditions.  Supporting Information:  H&H stable at 9.4 / 32.3  Please exercise your independent, professional judgment when responding. A specific answer is not anticipated or expected.  Thank You,  Zoila Shutter  RN, BSN, [place] [phone] ; Cell: [phone]

## 2015-08-07 NOTE — Progress Notes (Signed)
ANTICOAGULATION CONSULT NOTE - Follow Up Consult  Pharmacy Consult for Coumadin Indication: atrial fibrillation  No Known Allergies  Patient Measurements: Height: 5\' 10"  (177.8 cm) Weight: 272 lb 14.4 oz (123.787 kg) (c scale) IBW/kg (Calculated) : 73  Vital Signs: Temp: 98.4 F (36.9 C) (02/03 0835) Temp Source: Oral (02/03 0835) BP: 123/56 mmHg (02/03 0835) Pulse Rate: 77 (02/03 0835)  Labs:  Recent Labs  08/05/15 1055 08/06/15 0440 08/07/15 0400  HGB 9.4*  --   --   HCT 32.3*  --   --   PLT 169  --   --   LABPROT 24.6* 23.4* 21.6*  INR 2.24* 2.10* 1.89*  CREATININE 0.98 0.93 1.01    Estimated Creatinine Clearance: 98.8 mL/min (by C-G formula based on Cr of 1.01).  Assessment:   Continues on Coumadin for afib as prior to admission.   INR has trended down to 1.89 on home regimen of 8 mg daily.  Goal of Therapy:  INR 2-3 Monitor platelets by anticoagulation protocol: Yes   Plan:   Coumadin 10 mg x 1 today, then resume 8 mg daily on 08/08/15/  Daily PT/INR for now.  Arty Baumgartner, [place] Pager: [phone] 08/07/2015,12:07 PM

## 2015-08-07 NOTE — Progress Notes (Signed)
Patient Profile: 65 y.o. male with PMH of chronic atrial fibrillation on Coumadin, chronic hypertension, chronic diastolic CHF, obesity, and obstructive sleep apnea who was recently admitted 1/4-07/12/15 for acute on chronic diastolic CHF (echo [id number] showed EF of 55-60%). He was diuresed and discharge weight was 264 lb. He was initially scheduled for post hospital f/u on 1/19 but canceled. He presented to our office 08/05/15 for f/u and was found to be volume overloaded with acute CHF and highly symptomatic. Office weight up to 288 lb. He was admitted for IV diuretics. He was re weighed on arrival. Hospital weight was 274 lb.    Subjective: Feeling better.  Urinating a lot. Breathing and edema have improved.   Objective: Vital signs in last 24 hours: Temp:  [97 F (36.1 C)-98.7 F (37.1 C)] 98.4 F (36.9 C) (02/03 0835) Pulse Rate:  [59-77] 77 (02/03 0835) Resp:  [16-20] 20 (02/03 0835) BP: (115-123)/(56-66) 123/56 mmHg (02/03 0835) SpO2:  [97 %-100 %] 97 % (02/03 0835) Weight:  [123.787 kg (272 lb 14.4 oz)] 123.787 kg (272 lb 14.4 oz) (02/03 0508) Last BM Date: 08/05/15  Intake/Output from previous day: 02/02 0701 - 02/03 0700 In: 1300 [P.O.:1300] Out: 3075 [Urine:3075] Intake/Output this shift: Total I/O In: 240 [P.O.:240] Out: 2000 [Urine:2000]  Medications Current Facility-Administered Medications  Medication Dose Route Frequency Provider Last Rate Last Dose  . 0.9 %  sodium chloride infusion  250 mL Intravenous PRN Rhonda G Barrett, PA-C      . acetaminophen (TYLENOL) tablet 650 mg  650 mg Oral Q4H PRN Rhonda G Barrett, PA-C      . ALPRAZolam Duanne Moron) tablet 0.25 mg  0.25 mg Oral BID PRN Evelene Croon Barrett, PA-C      . aspirin chewable tablet 81 mg  81 mg Oral Daily Rhonda G Barrett, PA-C   81 mg at 08/07/15 0956  . diltiazem (CARDIZEM CD) 24 hr capsule 120 mg  120 mg Oral Daily Rhonda G Barrett, PA-C   120 mg at 08/07/15 0956  . docusate sodium (COLACE) capsule 100 mg  100  mg Oral BID Evelene Croon Barrett, PA-C   100 mg at 08/07/15 [id number]  . furosemide (LASIX) tablet 60 mg  60 mg Oral BID Skeet Latch, MD      . hydrALAZINE (APRESOLINE) tablet 50 mg  50 mg Oral BID Evelene Croon Barrett, PA-C   50 mg at 08/07/15 0956  . ipratropium (ATROVENT) nebulizer solution 0.5 mg  2.5 mL Inhalation Q4H PRN Rhonda G Barrett, PA-C      . lisinopril (PRINIVIL,ZESTRIL) tablet 20 mg  20 mg Oral Daily Rhonda G Barrett, PA-C   20 mg at 08/07/15 0955  . metoprolol tartrate (LOPRESSOR) tablet 50 mg  50 mg Oral BID Evelene Croon Barrett, PA-C   50 mg at 08/07/15 0956  . ondansetron (ZOFRAN) injection 4 mg  4 mg Intravenous Q6H PRN Rhonda G Barrett, PA-C      . potassium chloride SA (K-DUR,KLOR-CON) CR tablet 20 mEq  20 mEq Oral BID Evelene Croon Barrett, PA-C   20 mEq at 08/07/15 0956  . sodium chloride flush (NS) 0.9 % injection 3 mL  3 mL Intravenous Q12H Rhonda G Barrett, PA-C   3 mL at 08/07/15 0957  . sodium chloride flush (NS) 0.9 % injection 3 mL  3 mL Intravenous PRN Rhonda G Barrett, PA-C      . warfarin (COUMADIN) tablet 8 mg  8 mg Oral q1800 Valeda Malm Rumbarger, RPH  8 mg at 08/06/15 1838  . Warfarin - Pharmacist Dosing Inpatient   Does not apply q1800 Rachel L Rumbarger, RPH      . zolpidem (AMBIEN) tablet 5 mg  5 mg Oral QHS PRN,MR X 1 Rhonda G Barrett, PA-C        PE: Gen: Well-appearing. NAD Neck: JVP just above clavicle at 45 degrees COR: Irregularly irregular. No m/r/g.  Lungs: Rhonchi in the right lung field.  Abd: Soft, NT, ND. +BS Ext: WWP. Trace edema   Lab Results:   Recent Labs  08/05/15 1055  WBC 4.6  HGB 9.4*  HCT 32.3*  PLT 169   BMET  Recent Labs  08/05/15 1055 08/06/15 0440 08/07/15 0400  NA 140 139 139  K 4.0 4.2 4.0  CL 101 99* 101  CO2 29 31 31   GLUCOSE 86 75 88  BUN 12 12 15   CREATININE 0.98 0.93 1.01  CALCIUM 9.0 8.6* 8.8*   PT/INR  Recent Labs  08/05/15 1055 08/06/15 0440 08/07/15 0400  LABPROT 24.6* 23.4* 21.6*  INR 2.24*  2.10* 1.89*   I/O last 3 completed shifts: In: 1522 [P.O.:1522] Out: [id number] [Urine:3575] Total I/O In: 240 [P.O.:240] Out: 2000 [Urine:2000]  Filed Weights   08/05/15 1810 08/06/15 0500 08/07/15 0508  Weight: 124.467 kg (274 lb 6.4 oz) 123.832 kg (273 lb) 123.787 kg (272 lb 14.4 oz)    Assessment/Plan  Active Problems:   Acute on chronic diastolic CHF (congestive heart failure), NYHA class 4 (HCC)    1. Acute on Chronic Diastolic CHF: EF known to be 55-60%. Volume overload in the setting of dietary discretion and not knowing that he could increase his lasix.  [name] has diuresed very well.  Weight today is 272 lb.  He was 274 at admission.  Renal function is stable.  Continue diuresis with IV lasix.  He appears much more euvolemic.  Will attempt to switch to lasix 80 mg po bid.  2. Chronic Atrial Fibrillation: HR is controlled. INR is subtherapeutic. Continue Cardizem and metoprolol.   3. HTN: BP is well controlled. Continue Cardizem, metoprolol, lisinopril and hydralazine     LOS: 2 days    Ruthellen Tippy C. Oval Linsey, MD, [facility]  08/07/2015 11:51 AM

## 2015-08-07 NOTE — Progress Notes (Signed)
Paged Ellen Henri PA regarding pt having 3.7 sec pause. Pt asymptomatic and resting in bed. Vitals stable. Will continue to monitor pt.       Maurene Capes RN

## 2015-08-07 NOTE — Clinical Documentation Improvement (Signed)
Cardiology  Can the diagnosis of Hypoxia be further clarified? Please provide a diagnosis associated with the treatment provided if applicable. Please document response in next progress note NOT in BPA drop down box. Thanks!   Other  Clinically Undetermined  Supporting Information:  Accessory muscle usage present. No stridor. Tachypnea noted (RR ranging from 20 to 27) - ED note  Placed on BIpap and weaned to 3L of FiO2 post Bipap  Given IV lasix  Please exercise your independent, professional judgment when responding. A specific answer is not anticipated or expected.  Thank You, Zoila Shutter RN, BSN, [place] [phone]; Cell: [phone]

## 2015-08-08 ENCOUNTER — Encounter (HOSPITAL_COMMUNITY): Payer: Self-pay | Admitting: Physician Assistant

## 2015-08-08 DIAGNOSIS — I10 Essential (primary) hypertension: Secondary | ICD-10-CM

## 2015-08-08 LAB — BASIC METABOLIC PANEL
ANION GAP: 8 (ref 5–15)
BUN: 14 mg/dL (ref 6–20)
CHLORIDE: 100 mmol/L — AB (ref 101–111)
CO2: 28 mmol/L (ref 22–32)
Calcium: 8.7 mg/dL — ABNORMAL LOW (ref 8.9–10.3)
Creatinine, Ser: 1.01 mg/dL (ref 0.61–1.24)
GFR calc Af Amer: >60 mL/min (ref >60)
GLUCOSE: 78 mg/dL (ref 65–99)
POTASSIUM: 3.8 mmol/L (ref 3.5–5.1)
SODIUM: 136 mmol/L (ref 135–145)

## 2015-08-08 LAB — PROTIME-INR
INR: 1.74 — ABNORMAL HIGH (ref 0.00–1.49)
PROTHROMBIN TIME: 20.3 s — AB (ref 11.6–15.2)

## 2015-08-08 MED ORDER — WARFARIN SODIUM 4 MG PO TABS: ORAL_TABLET | ORAL | Status: DC

## 2015-08-08 MED ORDER — FUROSEMIDE 80 MG PO TABS: 80.0000 mg | ORAL_TABLET | Freq: Two times a day (BID) | ORAL | Status: DC

## 2015-08-08 MED ORDER — WARFARIN SODIUM 2 MG PO TABS: 12.0000 mg | ORAL_TABLET | Freq: Once | ORAL | Status: DC

## 2015-08-08 NOTE — Discharge Summary (Signed)
Discharge Summary    Patient ID: Steven Barnett,  MRN: [id number], DOB/AGE: 08-31-1950 65 y.o.  Admit date: 08/05/2015 Discharge date: 08/08/2015  Primary Care Provider: Elisabeth Barnett Primary Cardiologist: [facility]  Discharge Diagnoses       Acute on chronic diastolic CHF (congestive heart failure), NYHA class 4 ([place])   Hypertension   Chronic Afib on Coumadin   OSA   Allergies No Known Allergies  Diagnostic Studies/Procedures   None   History of Present Illness     65 y.o. male with PMH of chronic atrial fibrillation on Coumadin, chronic hypertension, chronic diastolic CHF, obesity, and obstructive sleep apnea who was recently admitted 1/4-07/12/15 for acute on chronic diastolic CHF (echo Q000111Q showed EF of 55-60%). He was diuresed and discharge weight was 264 lb. He was initially scheduled for post hospital f/u on 1/19 but canceled. He presented to our office 08/05/15 for f/u and was found to be volume overloaded with acute CHF and highly symptomatic. Office weight up to 288 lb. He was admitted directly for IV diuretics. He was re weighed on arrival. Hospital weight was 274 lb.   Hospital Course     Consultants: None  Volume overload in the setting of dietary discretion and not knowing that he could increase his lasix.BNP of 571.9.  [name] has diuresed very well. Weight  is 272 lb (discharge date). Total diuresis of  -3L.  He has diuresed well on po lasix Will continue lasix 80mg  bid at discharge. He will need follow up in clinic this week for BMP and lasix adjustment. He will likely need 40 mg bid with an increased dose when weight goes up.  HR was well controlled and INR is 1.74 at day of discharge. Will take coumadin 12mg  today and then as scheduled. Continue Cardizem and metoprolol for rate controlled. INR check at [facility] office next week.   The patient has been seen by Dr. Oval Barnett  today and deemed ready for discharge home. All follow-up appointments have  been scheduled. Discharge medications are listed below.   Discharge Vitals Blood pressure 107/66, pulse 76, temperature 98.6 F (37 C), temperature source Oral, resp. rate 18, height 5\' 10"  (1.778 m), weight 272 lb 10.8 oz (123.684 kg), SpO2 97 %.  Filed Weights   08/06/15 0500 08/07/15 0508 08/08/15 0429  Weight: 273 lb (123.832 kg) 272 lb 14.4 oz (123.787 kg) 272 lb 10.8 oz (123.684 kg)    Labs & Radiologic Studies    Basic Metabolic Panel  Recent Labs  08/07/15 0400 08/08/15 0326  NA 139 136  K 4.0 3.8  CL 101 100*  CO2 31 28  GLUCOSE 88 78  BUN 15 14  CREATININE 1.01 1.01  CALCIUM 8.8* 8.7*     Dg Chest Portable 1 View  08/05/2015  CLINICAL DATA:  Onset 2 weeks ago with intermittent shortness of breath. Worsening on exertion. EXAM: PORTABLE CHEST 1 VIEW COMPARISON:  07/08/2015 FINDINGS: Bilateral interstitial and alveolar airspace opacities. Small right pleural effusion. No focal consolidation or pneumothorax. Stable cardiomegaly. No acute osseous abnormality. IMPRESSION: Findings most concerning for CHF. Electronically Signed   By: Steven Barnett   On: 08/05/2015 11:01    Disposition   Pt is being discharged home today in good condition.  Follow-up Plans & Appointments    Follow-up Information    Follow up with [facility].   Specialty:  Home Health Services   Why:  They will continue to  do your home health care at your home   Contact information:   [address] [place] [address] [phone]       Follow up with Steven Casino, MD.   Specialty:  Cardiology   Why:  office with call with appoinment   Contact information:   [address] [place] [address] [phone]      Discharge Instructions    Diet - low sodium heart healthy    Complete by:  As directed      Discharge instructions    Complete by:  As directed   Take lasix 80mg  two times a day by mouth until seen in clinic.  Take Coumadin  total 12mg  tonight 08/08/15. Then resume daily schedule dose.     Increase activity slowly    Complete by:  As directed            Discharge Medications   Current Discharge Medication List    CONTINUE these medications which have CHANGED   Details  furosemide (LASIX) 80 MG tablet Take 1 tablet (80 mg total) by mouth 2 (two) times daily. Qty: 60 tablet, Refills: 3    warfarin (COUMADIN) 4 MG tablet Take 2 tablets by mouth daily or as directed by coumadin clinic  Take Coumadin total 12mg  tonight 08/08/15. Qty: 60 tablet, Refills: 1      CONTINUE these medications which have NOT CHANGED   Details  acetaminophen (TYLENOL) 500 MG tablet Take 2,000 mg by mouth 2 (two) times daily.    aspirin 81 MG tablet Take 81 mg by mouth daily.    diltiazem (CARDIZEM) 120 MG tablet TAKE ONE TABLET BY MOUTH ONCE DAILY KEEP  2  WEEK  FOLLOW  UP  APPOINTMENT  WITH  DR  HILTY Qty: 30 tablet, Refills: 0    docusate sodium (COLACE) 100 MG capsule Take 1 capsule (100 mg total) by mouth 2 (two) times daily. For constipation Qty: 60 capsule, Refills: 3    hydrALAZINE (APRESOLINE) 50 MG tablet TAKE ONE TABLET BY MOUTH TWICE DAILY Qty: 60 tablet, Refills: 0    ipratropium (ATROVENT HFA) 17 MCG/ACT inhaler Inhale 2 puffs into the lungs as needed for wheezing.    lisinopril (PRINIVIL,ZESTRIL) 20 MG tablet TAKE ONE TABLET BY MOUTH ONCE DAILY Qty: 30 tablet, Refills: 11    metoprolol (LOPRESSOR) 25 MG tablet Take 2 tablets (50 mg total) by mouth 2 (two) times daily. Qty: 60 tablet, Refills: 5    potassium chloride (K-DUR,KLOR-CON) 20 MEQ tablet Take 1 tablet (20 mEq total) by mouth daily. Qty: 30 tablet, Refills: 3    sildenafil (REVATIO) 20 MG tablet TAKE 2 TO 3 TABLETS BY MOUTH ONCE DAILY AS NEEDED FOR SEXUAL ACTIVITY Qty: 30 tablet, Refills: 0           Outstanding Labs/Studies   BMET during TCM  Duration of Discharge Encounter   Greater than 30 minutes including physician  time.  Signed, Steven Rabold PA-C 08/08/2015, 2:26 PM

## 2015-08-08 NOTE — Progress Notes (Signed)
Patient Profile: 65 y.o. male with PMH of chronic atrial fibrillation on Coumadin, chronic hypertension, chronic diastolic CHF, obesity, and obstructive sleep apnea who was recently admitted 1/4-07/12/15 for acute on chronic diastolic CHF (echo [id number] showed EF of 55-60%). He was diuresed and discharge weight was 264 lb. He was initially scheduled for post hospital f/u on 1/19 but canceled. He presented to our office 08/05/15 for f/u and was found to be volume overloaded with acute CHF and highly symptomatic. Office weight up to 288 lb. He was admitted for IV diuretics. He was re weighed on arrival. Hospital weight was 274 lb.    Subjective: Feeling better.  Ready to go home.    Objective: Vital signs in last 24 hours: Temp:  [98.1 F (36.7 C)-98.4 F (36.9 C)] 98.2 F (36.8 C) (02/04 0838) Pulse Rate:  [66-74] 73 (02/04 0838) Resp:  [19-22] 20 (02/04 0838) BP: (118-138)/(61-74) 118/72 mmHg (02/04 0838) SpO2:  [96 %-99 %] 99 % (02/04 0838) Weight:  [123.684 kg (272 lb 10.8 oz)] 123.684 kg (272 lb 10.8 oz) (02/04 0429) Last BM Date: 08/05/15  Intake/Output from previous day: 02/03 0701 - 02/04 0700 In: 1160 [P.O.:1160] Out: 4225 [Urine:4225] Intake/Output this shift: Total I/O In: 240 [P.O.:240] Out: -   Medications Current Facility-Administered Medications  Medication Dose Route Frequency Provider Last Rate Last Dose  . 0.9 %  sodium chloride infusion  250 mL Intravenous PRN Rhonda G Barrett, PA-C      . acetaminophen (TYLENOL) tablet 650 mg  650 mg Oral Q4H PRN Evelene Croon Barrett, PA-C   650 mg at 08/08/15 0437  . ALPRAZolam Duanne Moron) tablet 0.25 mg  0.25 mg Oral BID PRN Evelene Croon Barrett, PA-C      . aspirin chewable tablet 81 mg  81 mg Oral Daily Rhonda G Barrett, PA-C   81 mg at 08/08/15 1000  . diltiazem (CARDIZEM CD) 24 hr capsule 120 mg  120 mg Oral Daily Rhonda G Barrett, PA-C   120 mg at 08/08/15 1000  . docusate sodium (COLACE) capsule 100 mg  100 mg Oral BID Evelene Croon  Barrett, PA-C   100 mg at 08/08/15 1000  . furosemide (LASIX) tablet 80 mg  80 mg Oral BID Skeet Latch, MD   80 mg at 08/08/15 0800  . hydrALAZINE (APRESOLINE) tablet 50 mg  50 mg Oral BID Rhonda G Barrett, PA-C   50 mg at 08/08/15 1000  . ipratropium (ATROVENT) nebulizer solution 0.5 mg  2.5 mL Inhalation Q4H PRN Rhonda G Barrett, PA-C      . lisinopril (PRINIVIL,ZESTRIL) tablet 20 mg  20 mg Oral Daily Rhonda G Barrett, PA-C   20 mg at 08/08/15 1000  . metoprolol tartrate (LOPRESSOR) tablet 50 mg  50 mg Oral BID Rhonda G Barrett, PA-C   50 mg at 08/08/15 1000  . ondansetron (ZOFRAN) injection 4 mg  4 mg Intravenous Q6H PRN Rhonda G Barrett, PA-C      . potassium chloride SA (K-DUR,KLOR-CON) CR tablet 20 mEq  20 mEq Oral BID Rhonda G Barrett, PA-C   20 mEq at 08/08/15 1000  . sodium chloride flush (NS) 0.9 % injection 3 mL  3 mL Intravenous Q12H Rhonda G Barrett, PA-C   3 mL at 08/08/15 1000  . sodium chloride flush (NS) 0.9 % injection 3 mL  3 mL Intravenous PRN Rhonda G Barrett, PA-C      . warfarin (COUMADIN) tablet 8 mg  8 mg Oral q1800 Skeet Simmer,  RPH      . Warfarin - Pharmacist Dosing Inpatient   Does not apply q1800 Rachel L Rumbarger, RPH      . zolpidem (AMBIEN) tablet 5 mg  5 mg Oral QHS PRN,MR X 1 Rhonda G Barrett, PA-C        PE: Gen: Well-appearing. NAD Neck: JVP not visible sitting upright.   COR: Irregularly irregular. No m/r/g.  Lungs: CTAB.  No crackles, rhonchi or wheezes. Abd: Soft, NT, ND. +BS Ext: WWP. Trace edema   Lab Results:   Recent Labs  08/05/15 1055  WBC 4.6  HGB 9.4*  HCT 32.3*  PLT 169   BMET  Recent Labs  08/06/15 0440 08/07/15 0400 08/08/15 0326  NA 139 139 136  K 4.2 4.0 3.8  CL 99* 101 100*  CO2 31 31 28   GLUCOSE 75 88 78  BUN 12 15 14   CREATININE 0.93 1.01 1.01  CALCIUM 8.6* 8.8* 8.7*   PT/INR  Recent Labs  08/06/15 0440 08/07/15 0400 08/08/15 0326  LABPROT 23.4* 21.6* 20.3*  INR 2.10* 1.89* 1.74*   I/O last  3 completed shifts: In: 1520 [P.O.:1520] Out: 5400 [Urine:5400] Total I/O In: 240 [P.O.:240] Out: -   Filed Weights   08/06/15 0500 08/07/15 0508 08/08/15 0429  Weight: 123.832 kg (273 lb) 123.787 kg (272 lb 14.4 oz) 123.684 kg (272 lb 10.8 oz)    Assessment/Plan  Active Problems:   Acute on chronic diastolic CHF (congestive heart failure), NYHA class 4 (HCC)    1. Acute on Chronic Diastolic CHF: EF known to be 55-60%. Volume overload in the setting of dietary discretion and not knowing that he could increase his lasix.  [name] has diuresed very well.  Weight today is 272 lb, unchanged from yesterday.  Yet he is recorded as -3L.  Clinically he is much better and only mildly volume overloaded.  He has diuresed well on po lasix   Will continue lasix 80mg  bid at discharge.  He will need follow up in clinic this week for BMP and lasix adjustment.  He will likely need 40 mg bid with an increased dose when weight goes up.   2. Chronic Atrial Fibrillation: HR is controlled. INR is therapeutic. Continue Cardizem and metoprolol.   3. HTN: BP is well controlled. Continue Cardizem, metoprolol, lisinopril and hydralazine     LOS: 3 days    Ivana Nicastro C. Oval Linsey, MD, [facility]  08/08/2015 10:21 AM

## 2015-08-08 NOTE — Progress Notes (Signed)
Patient stated he just wanted to rest tonight. He did not want to wear his CPAP tonight. Patient in no distress. RT will continue to monitor as needed.

## 2015-08-08 NOTE — Progress Notes (Signed)
Pt given discharged instructions, questions answered. IV and Tele removed. No concerns at this time. Taken out via wheelchair.

## 2015-08-08 NOTE — Progress Notes (Signed)
ANTICOAGULATION CONSULT NOTE - Follow Up Consult  Pharmacy Consult for Coumadin Indication: atrial fibrillation  No Known Allergies  Patient Measurements: Height: 5\' 10"  (177.8 cm) Weight: 272 lb 10.8 oz (123.684 kg) (Scale C) IBW/kg (Calculated) : 73  Vital Signs: Temp: 98.6 F (37 C) (02/04 1144) Temp Source: Oral (02/04 1144) BP: 107/66 mmHg (02/04 1144) Pulse Rate: 76 (02/04 1144)  Labs:  Recent Labs  08/06/15 0440 08/07/15 0400 08/08/15 0326  LABPROT 23.4* 21.6* 20.3*  INR 2.10* 1.89* 1.74*  CREATININE 0.93 1.01 1.01    Estimated Creatinine Clearance: 98.8 mL/min (by C-G formula based on Cr of 1.01).  Assessment: 65 y/o male on Coumadin PTA for Afib. INR has trended down since admission and is subtherapeutic at 1.74. Given a little extra last night but has trended down further.  PTA regimen: 8 mg daily  Goal of Therapy:  INR 2-3 Monitor platelets by anticoagulation protocol: Yes   Plan:  Coumadin 12 mg PO tonight (~1.5x home dose) Daily INR  [facility], Pharm.D., BCPS Clinical Pharmacist Pager: [phone] 08/08/2015 1:25 PM

## 2015-08-14 ENCOUNTER — Telehealth: Payer: Self-pay | Admitting: Internal Medicine

## 2015-08-14 NOTE — Telephone Encounter (Signed)
Melissa with [facility] called PCP and reported wt was from 271-to 274.5lbs, HR was 73 but now is 112 and oxygen level range is 89-98. PCP is calling to report these findings to the cardiologist   08/14/2015 08:18 AM Phone (Incoming) [email or web address]    Phone: [phone]    talked with Sharyn Lull his [facility] from [facility]. She said his weight fluctuates and is not compliant with his diet but is compliant with his medications The day after he celebrated the [employer] his weight went from 271 lb to 274 lb  Told Melissa that his Lasix had been changed when he was discharged from the hospital She well verify that he is taking lasix 80 mg twice a day  Melissa will also instruct patient to avoid high salted foods, processed foods, canned foods along with careful selections when eating out to avoid excessive salt intake  I talked with patient He is taking Lasix 80 mg twice a day I reinforced what foods to stay away from.  Told him he needs to really try to adhere to being mindful of what he is eating to stay out of the hospital Denies any dependent edema.  Is sleeping in his normal position at night without need of elevation of his head Instructed him to call our office or seek medical care immediately if he becomes short of breath. Patient said that he understands his instructions and will try to watch what he eats

## 2015-08-14 NOTE — Telephone Encounter (Signed)
New message      FYI Melissa with [facility] called PCP and reported wt was from 271-to 274.5lbs, HR was 73 but now is 112 and oxygen level range is 89-98.  PCP is calling to report these findings to the cardiologist

## 2015-08-14 NOTE — Telephone Encounter (Signed)
Thanks .Marland Kitchen I need to see him back to assess compliance.  DR. Lemmie Evens

## 2015-08-14 NOTE — Telephone Encounter (Signed)
[name] was calling back to give Steven Barnett the home health nurses' number. Her name is [name] and her number is [phone]. Please f/u with her  Thanks

## 2015-08-17 NOTE — Telephone Encounter (Signed)
Routing to NL triage

## 2015-09-02 ENCOUNTER — Other Ambulatory Visit: Payer: Self-pay | Admitting: Internal Medicine

## 2015-09-02 ENCOUNTER — Encounter: Payer: Self-pay | Admitting: Internal Medicine

## 2015-09-02 ENCOUNTER — Ambulatory Visit (INDEPENDENT_AMBULATORY_CARE_PROVIDER_SITE_OTHER): Payer: Medicaid Other | Admitting: Pharmacist Clinician (PhC)/ Clinical Pharmacy Specialist

## 2015-09-02 ENCOUNTER — Ambulatory Visit (INDEPENDENT_AMBULATORY_CARE_PROVIDER_SITE_OTHER): Payer: Medicaid Other | Admitting: Internal Medicine

## 2015-09-02 VITALS — BP 150/90 | HR 90 | Ht 71.0 in | Wt 273.0 lb

## 2015-09-02 DIAGNOSIS — I5033 Acute on chronic diastolic (congestive) heart failure: Secondary | ICD-10-CM | POA: Diagnosis not present

## 2015-09-02 DIAGNOSIS — I4821 Permanent atrial fibrillation: Secondary | ICD-10-CM

## 2015-09-02 DIAGNOSIS — I482 Chronic atrial fibrillation: Secondary | ICD-10-CM

## 2015-09-02 DIAGNOSIS — G4733 Obstructive sleep apnea (adult) (pediatric): Secondary | ICD-10-CM

## 2015-09-02 DIAGNOSIS — Z7901 Long term (current) use of anticoagulants: Secondary | ICD-10-CM

## 2015-09-02 DIAGNOSIS — I10 Essential (primary) hypertension: Secondary | ICD-10-CM

## 2015-09-02 LAB — POCT INR: INR: 2.3

## 2015-09-02 MED ORDER — METOPROLOL TARTRATE 50 MG PO TABS: 50.0000 mg | ORAL_TABLET | Freq: Two times a day (BID) | ORAL | Status: DC

## 2015-09-02 MED ORDER — DILTIAZEM HCL 120 MG PO TABS: 120.0000 mg | ORAL_TABLET | Freq: Every day | ORAL | Status: DC

## 2015-09-02 NOTE — Patient Instructions (Signed)
Dr [name] has recommended making the following medication changes: TAKE 50 mg of Metoprolol twice daily  Dr [name] recommends that you schedule a follow-up appointment in 3 months.  If you need a refill on your cardiac medications before your next appointment, please call your pharmacy.

## 2015-09-03 NOTE — Telephone Encounter (Signed)
REFILL 

## 2015-09-03 NOTE — Progress Notes (Signed)
Marland Kitchen    OFFICE NOTE   Chief Complaint:  Hospital follow-up  Primary Care Physician: Elisabeth Cara, PA-C  HPI:  Steven Barnett is a 65 year old morbidly obese African American male with a history significant for hypertension, sleep disorder consistent with obstructive sleep apnea, recent diagnosis of diastolic heart failure with moderate LVH and recent diagnosis of postoperative atrial fibrillation (continues to be in atrial fibrillation) presenting with atypical chest pain. The patient was hospitalized in May 2010 and diagnosed with a right-sided empyema, for which he underwent a thoracotomy. He was found to be in atrial fibrillation postoperatively and has been in this rhythm ever since. At that time, he was also diagnosed with diastolic heart failure and found to have moderate left ventricular hypertrophy by echocardiogram.   Unfortunately, he was incarcerated for the past several years and has not been followed by cardiologist. His warfarin has been continued while in jail however was not adjusted regularly. He recently saw a new primary care provider with [facility] regional physicians and he is here to reestablish cardiac care and followup of his warfarin. He remains in atrial fibrillation today. He reports shortness of breath which is unchanged.  He denies any chest pain. He does report some leg swelling. He has problems with his knees from arthritis and pain. He also has not been able to lose weight. Finally, he has a history of sleep apnea but was never treated for this. He reports poor sleep at night and daytime fatigue and sometimes feels like he doesn't want to fall asleep because he feels like he is going to stop breathing. There is witnessed apnea.  I saw [name] in the office today. His main complaints are with erectile dysfunction. He is interested in Viagra. Denies any chest pain and reports good sleep. He is therapeutic on warfarin and is in permanent atrial fibrillation.  Mr.  Steven Barnett returns to the office today. He is inquiring about getting generic sildenafil as Viagra was not covered. He also wants to be referred for colonoscopy. I told him that would be up to his primary care provider but he said "I thought you were my primary care provider". I clarified the differences in of encouraged him to follow-up with his primary care provider with [facility] physicians. We will go ahead and refer him for screening colonoscopy. He was advised that he need to hold warfarin for least 5 days prior to colonoscopy.  I saw [name] back in the office today. He was seen this summer by Tarri Fuller, PA-C, for evaluation of cough and shortness of breath. A chest x-ray demonstrated possible pneumonia and was treated with doxycycline. He's not sure if this actually improved his symptoms or not. He recently has been absent for monthly warfarin checks. He says that he was not notified by the office however monthly appointments are scheduled and call also been made. He would like to arrange for a set day for him to come and get his warfarin checked.  [name] returns today for follow-up. He was recently hospitalized with acute on chronic diastolic heart failure. He had significant weight gain and edema with shortness of breath. He was diuresed and discharged in much better condition. He follows up today in the office. He says that he has not been entirely compliant with his medications, due to some confusion. His blood pressure today is 150/90 with a heart rate of 90. He says he's been taking 25 mg or 1 tablet of Lopressor twice daily and set  of 2 tablets twice daily which was recommended. Weight on discharge was 264 and today is 273 although he measured to 69 at home. He denies any worsening swelling or shortness of breath.  PMHx:  Past Medical History  Diagnosis Date  . Hypertension   . Permanent atrial fibrillation ([place]) 2010  . Chronic anticoagulation 2010    Coumadin  . OSA (obstructive sleep  apnea) 2016    Not on CPAP  . Chronic diastolic CHF (congestive heart failure), NYHA class 2 [facility])     Past Surgical History  Procedure Laterality Date  . Thoracotomy Right 2010    FAMHx:  Family History  Problem Relation Age of Onset  . Hypertension Mother   . Diabetes Mother   . Hyperlipidemia Mother   . Heart disease Father   . Heart failure Father   . Heart attack Neg Hx   . Stroke Maternal Grandmother   . Stroke Sister     SOCHx:   reports that he quit smoking about 40 years ago. He has never used smokeless tobacco. He reports that he does not drink alcohol or use illicit drugs.  ALLERGIES:  No Known Allergies  ROS: Pertinent items noted in HPI and remainder of comprehensive ROS otherwise negative.  HOME MEDS: Current Outpatient Prescriptions  Medication Sig Dispense Refill  . acetaminophen (TYLENOL) 500 MG tablet Take 2,000 mg by mouth 2 (two) times daily.    Marland Kitchen aspirin 81 MG tablet Take 81 mg by mouth daily.    Marland Kitchen diltiazem (CARDIZEM) 120 MG tablet Take 1 tablet (120 mg total) by mouth daily. 30 tablet 11  . docusate sodium (COLACE) 100 MG capsule Take 1 capsule (100 mg total) by mouth 2 (two) times daily. For constipation 60 capsule 3  . furosemide (LASIX) 80 MG tablet Take 1 tablet (80 mg total) by mouth 2 (two) times daily. 60 tablet 3  . ipratropium (ATROVENT HFA) 17 MCG/ACT inhaler Inhale 2 puffs into the lungs as needed for wheezing.    Marland Kitchen lisinopril (PRINIVIL,ZESTRIL) 20 MG tablet TAKE ONE TABLET BY MOUTH ONCE DAILY 30 tablet 11  . metoprolol tartrate (LOPRESSOR) 50 MG tablet Take 1 tablet (50 mg total) by mouth 2 (two) times daily. 60 tablet 11  . potassium chloride (K-DUR,KLOR-CON) 20 MEQ tablet Take 1 tablet (20 mEq total) by mouth daily. 30 tablet 3  . sildenafil (REVATIO) 20 MG tablet TAKE 2 TO 3 TABLETS BY MOUTH ONCE DAILY AS NEEDED FOR SEXUAL ACTIVITY 30 tablet 0  . warfarin (COUMADIN) 4 MG tablet Take 2 tablets by mouth daily or as directed by coumadin  clinic  Take Coumadin total 12mg  tonight 08/08/15. 60 tablet 1  . hydrALAZINE (APRESOLINE) 50 MG tablet TAKE ONE TABLET BY MOUTH TWICE DAILY 60 tablet 4   No current facility-administered medications for this visit.    LABS/IMAGING: Results for orders placed or performed in visit on 09/02/15 (from the past 48 hour(s))  POCT INR     Status: None   Collection Time: 09/02/15 11:00 AM  Result Value Ref Range   INR 2.3    No results found.  VITALS: BP 150/90 mmHg  Pulse 90  Ht 5\' 11"  (1.803 m)  Wt 273 lb (123.832 kg)  BMI 38.09 kg/m2  EXAM: General appearance: alert, no distress and morbidly obese Neck: no carotid bruit and no JVD Lungs: clear to auscultation bilaterally Heart: irregularly irregular rhythm Abdomen: soft, non-tender; bowel sounds normal; no masses,  no organomegaly Extremities: edema trace pitting edema  Pulses: 2+ and symmetric Skin: Skin color, texture, turgor normal. No rashes or lesions Neurologic: Grossly normal Psych: Pleasant  EKG: Atrial fibrillation at 90  ASSESSMENT: 1. Permanent atrial fibrillation 2. Morbid obesity 3. Obstructive sleep apnea-not on CPAP 4. Hypertension controlled 5. Leg edema 6. Bilateral knee osteoarthritis 7. Erectile dysfunction 8. Recent diastolic congestive heart failure  PLAN: 1.   Mr. Zipprich seems to have returned back to baseline after recent exacerbation of diastolic heart failure. He is confused about the dose of his metoprolol. He should be on 2 tablets (50 mg) by mouth twice daily. We reiterated that dose. Coumadin is adjusted today by Erasmo Downer. We will schedule close follow-up in the next few months.   Pixie Casino, MD, [facility] Attending Cardiologist Lincoln C Hilty 09/03/2015, 7:30 PM

## 2015-09-10 ENCOUNTER — Other Ambulatory Visit: Payer: Self-pay | Admitting: Internal Medicine

## 2015-09-10 NOTE — Telephone Encounter (Signed)
Message routed to MD approve refill  Last refilled  30 tablet 0 07/28/2015

## 2015-09-13 DIAGNOSIS — I5032 Chronic diastolic (congestive) heart failure: Secondary | ICD-10-CM | POA: Diagnosis not present

## 2015-09-13 DIAGNOSIS — I482 Chronic atrial fibrillation: Secondary | ICD-10-CM | POA: Diagnosis not present

## 2015-09-13 DIAGNOSIS — I110 Hypertensive heart disease with heart failure: Secondary | ICD-10-CM | POA: Diagnosis not present

## 2015-09-29 ENCOUNTER — Ambulatory Visit (INDEPENDENT_AMBULATORY_CARE_PROVIDER_SITE_OTHER): Payer: Medicaid Other | Admitting: Pharmacist Clinician (PhC)/ Clinical Pharmacy Specialist

## 2015-09-29 DIAGNOSIS — I4821 Permanent atrial fibrillation: Secondary | ICD-10-CM

## 2015-09-29 DIAGNOSIS — Z7901 Long term (current) use of anticoagulants: Secondary | ICD-10-CM

## 2015-09-29 DIAGNOSIS — I482 Chronic atrial fibrillation: Secondary | ICD-10-CM | POA: Diagnosis not present

## 2015-09-29 LAB — POCT INR: INR: 1.9

## 2015-09-30 ENCOUNTER — Encounter (HOSPITAL_COMMUNITY): Payer: Self-pay | Admitting: Emergency Medicine

## 2015-09-30 ENCOUNTER — Inpatient Hospital Stay (HOSPITAL_COMMUNITY): Payer: Medicaid Other

## 2015-09-30 ENCOUNTER — Emergency Department (HOSPITAL_COMMUNITY): Payer: Medicaid Other

## 2015-09-30 ENCOUNTER — Inpatient Hospital Stay (HOSPITAL_COMMUNITY)
Admission: EM | Admit: 2015-09-30 | Discharge: 2015-10-05 | DRG: 193 | Disposition: A | Discharge status: Home / Self Care | Payer: Medicaid Other | Attending: Family Medicine | Admitting: Family Medicine

## 2015-09-30 DIAGNOSIS — J9601 Acute respiratory failure with hypoxia: Secondary | ICD-10-CM | POA: Diagnosis present

## 2015-09-30 DIAGNOSIS — I482 Chronic atrial fibrillation: Secondary | ICD-10-CM | POA: Diagnosis present

## 2015-09-30 DIAGNOSIS — I2510 Atherosclerotic heart disease of native coronary artery without angina pectoris: Secondary | ICD-10-CM | POA: Diagnosis present

## 2015-09-30 DIAGNOSIS — D509 Iron deficiency anemia, unspecified: Secondary | ICD-10-CM | POA: Diagnosis present

## 2015-09-30 DIAGNOSIS — I481 Persistent atrial fibrillation: Secondary | ICD-10-CM | POA: Diagnosis present

## 2015-09-30 DIAGNOSIS — Z87891 Personal history of nicotine dependence: Secondary | ICD-10-CM

## 2015-09-30 DIAGNOSIS — I5032 Chronic diastolic (congestive) heart failure: Secondary | ICD-10-CM | POA: Diagnosis not present

## 2015-09-30 DIAGNOSIS — I5033 Acute on chronic diastolic (congestive) heart failure: Secondary | ICD-10-CM | POA: Diagnosis present

## 2015-09-30 DIAGNOSIS — Z7982 Long term (current) use of aspirin: Secondary | ICD-10-CM | POA: Diagnosis not present

## 2015-09-30 DIAGNOSIS — K439 Ventral hernia without obstruction or gangrene: Secondary | ICD-10-CM | POA: Diagnosis present

## 2015-09-30 DIAGNOSIS — I959 Hypotension, unspecified: Secondary | ICD-10-CM | POA: Diagnosis present

## 2015-09-30 DIAGNOSIS — K7581 Nonalcoholic steatohepatitis (NASH): Secondary | ICD-10-CM | POA: Diagnosis present

## 2015-09-30 DIAGNOSIS — Z7901 Long term (current) use of anticoagulants: Secondary | ICD-10-CM | POA: Diagnosis not present

## 2015-09-30 DIAGNOSIS — D696 Thrombocytopenia, unspecified: Secondary | ICD-10-CM | POA: Diagnosis present

## 2015-09-30 DIAGNOSIS — Z6839 Body mass index (BMI) 39.0-39.9, adult: Secondary | ICD-10-CM

## 2015-09-30 DIAGNOSIS — R05 Cough: Secondary | ICD-10-CM

## 2015-09-30 DIAGNOSIS — I110 Hypertensive heart disease with heart failure: Secondary | ICD-10-CM | POA: Diagnosis present

## 2015-09-30 DIAGNOSIS — J9811 Atelectasis: Secondary | ICD-10-CM | POA: Diagnosis present

## 2015-09-30 DIAGNOSIS — R0602 Shortness of breath: Secondary | ICD-10-CM | POA: Diagnosis not present

## 2015-09-30 DIAGNOSIS — G4733 Obstructive sleep apnea (adult) (pediatric): Secondary | ICD-10-CM | POA: Diagnosis present

## 2015-09-30 DIAGNOSIS — J189 Pneumonia, unspecified organism: Principal | ICD-10-CM | POA: Diagnosis present

## 2015-09-30 DIAGNOSIS — E876 Hypokalemia: Secondary | ICD-10-CM | POA: Diagnosis not present

## 2015-09-30 DIAGNOSIS — D649 Anemia, unspecified: Secondary | ICD-10-CM

## 2015-09-30 DIAGNOSIS — R001 Bradycardia, unspecified: Secondary | ICD-10-CM | POA: Diagnosis present

## 2015-09-30 HISTORY — DX: Unspecified osteoarthritis, unspecified site: M19.90

## 2015-09-30 HISTORY — DX: Pneumonia, unspecified organism: J18.9

## 2015-09-30 LAB — COMPREHENSIVE METABOLIC PANEL
ALT: 17 U/L (ref 17–63)
AST: 28 U/L (ref 15–41)
Albumin: 3.3 g/dL — ABNORMAL LOW (ref 3.5–5.0)
Alkaline Phosphatase: 98 U/L (ref 38–126)
Anion gap: 10 (ref 5–15)
BUN: 12 mg/dL (ref 6–20)
CHLORIDE: 100 mmol/L — AB (ref 101–111)
CO2: 31 mmol/L (ref 22–32)
Calcium: 8.7 mg/dL — ABNORMAL LOW (ref 8.9–10.3)
Creatinine, Ser: 1.2 mg/dL (ref 0.61–1.24)
GFR calc Af Amer: >60 mL/min (ref >60)
Glucose, Bld: 95 mg/dL (ref 65–99)
Potassium: 3.6 mmol/L (ref 3.5–5.1)
SODIUM: 141 mmol/L (ref 135–145)
TOTAL PROTEIN: 6.8 g/dL (ref 6.5–8.1)
Total Bilirubin: 1.9 mg/dL — ABNORMAL HIGH (ref 0.3–1.2)

## 2015-09-30 LAB — CBC
HCT: 29.2 % — ABNORMAL LOW (ref 39.0–52.0)
Hemoglobin: 8.1 g/dL — ABNORMAL LOW (ref 13.0–17.0)
MCH: 19 pg — ABNORMAL LOW (ref 26.0–34.0)
MCHC: 27.7 g/dL — ABNORMAL LOW (ref 30.0–36.0)
MCV: 68.4 fL — AB (ref 78.0–100.0)
PLATELETS: 150 10*3/uL (ref 150–400)
RBC: 4.27 MIL/uL (ref 4.22–5.81)
RDW: 21.3 % — ABNORMAL HIGH (ref 11.5–15.5)
WBC: 4.6 10*3/uL (ref 4.0–10.5)

## 2015-09-30 LAB — PROTIME-INR
INR: 2.04 — AB (ref 0.00–1.49)
PROTHROMBIN TIME: 22.9 s — AB (ref 11.6–15.2)

## 2015-09-30 LAB — BRAIN NATRIURETIC PEPTIDE: B NATRIURETIC PEPTIDE 5: 599 pg/mL — AB (ref 0.0–100.0)

## 2015-09-30 LAB — TROPONIN I

## 2015-09-30 IMAGING — DX DG CHEST 2V
2 series · 2 of 2 positions shown · non-contrast
Comparison: [DATE]

CLINICAL DATA: Productive cough

EXAM:
CHEST  2 VIEW

[w chest pa]
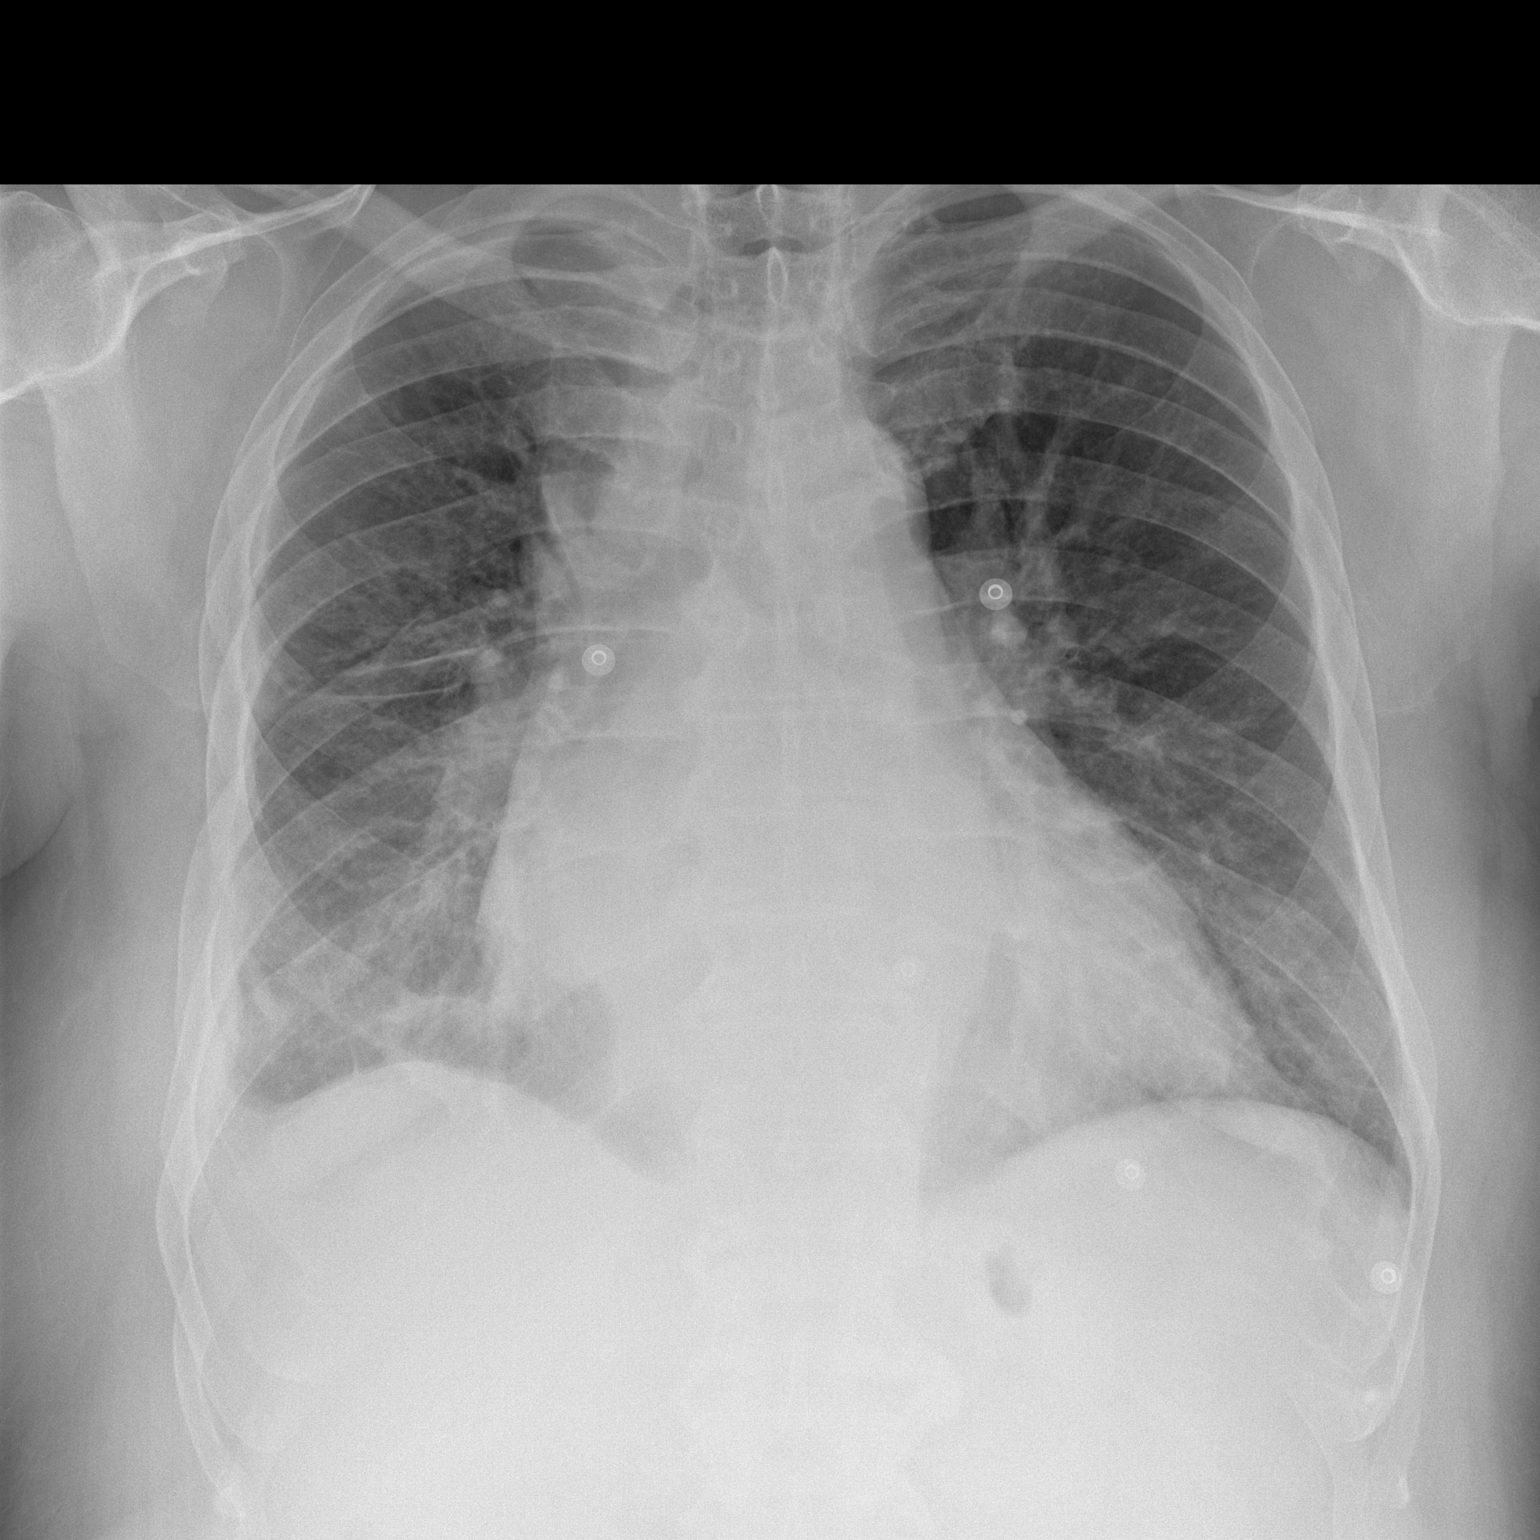

[w chest lat]
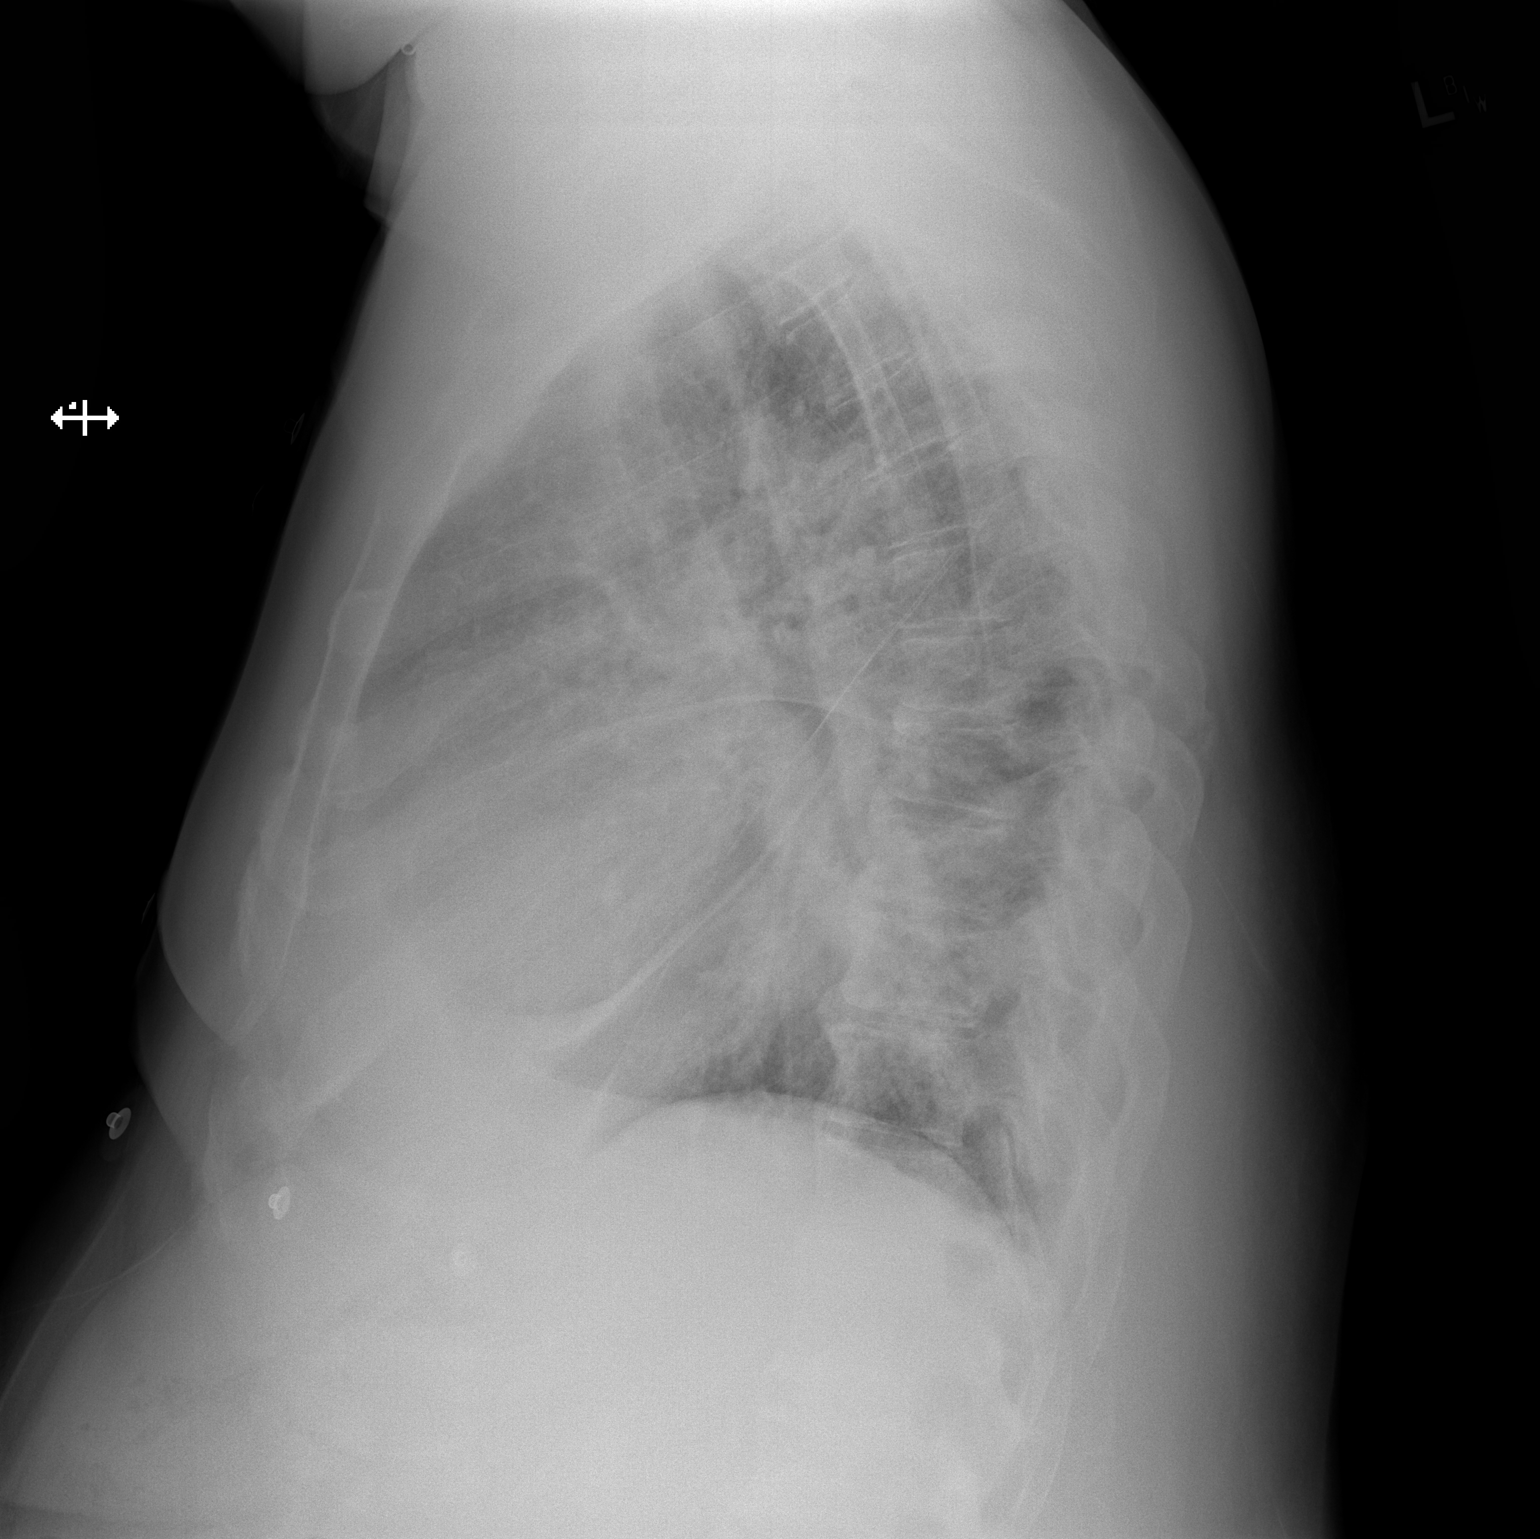

[2 of 2 positions shown; findings below may reference images not displayed]

FINDINGS: Cardiomegaly again noted. Persistent hazy right basilar atelectasis
or infiltrate. Small right pleural effusion. Mild degenerative
changes thoracic spine. There is no pulmonary edema.
IMPRESSION: Persistent hazy right basilar atelectasis or infiltrate. Small right
pleural effusion. No pulmonary edema

## 2015-09-30 IMAGING — CR DG CHEST 1V PORT
1 series · 1 of 1 positions shown · non-contrast
Comparison: [DATE]

CLINICAL DATA: Shortness of breath starting last night

EXAM:
PORTABLE CHEST 1 VIEW

[AP]
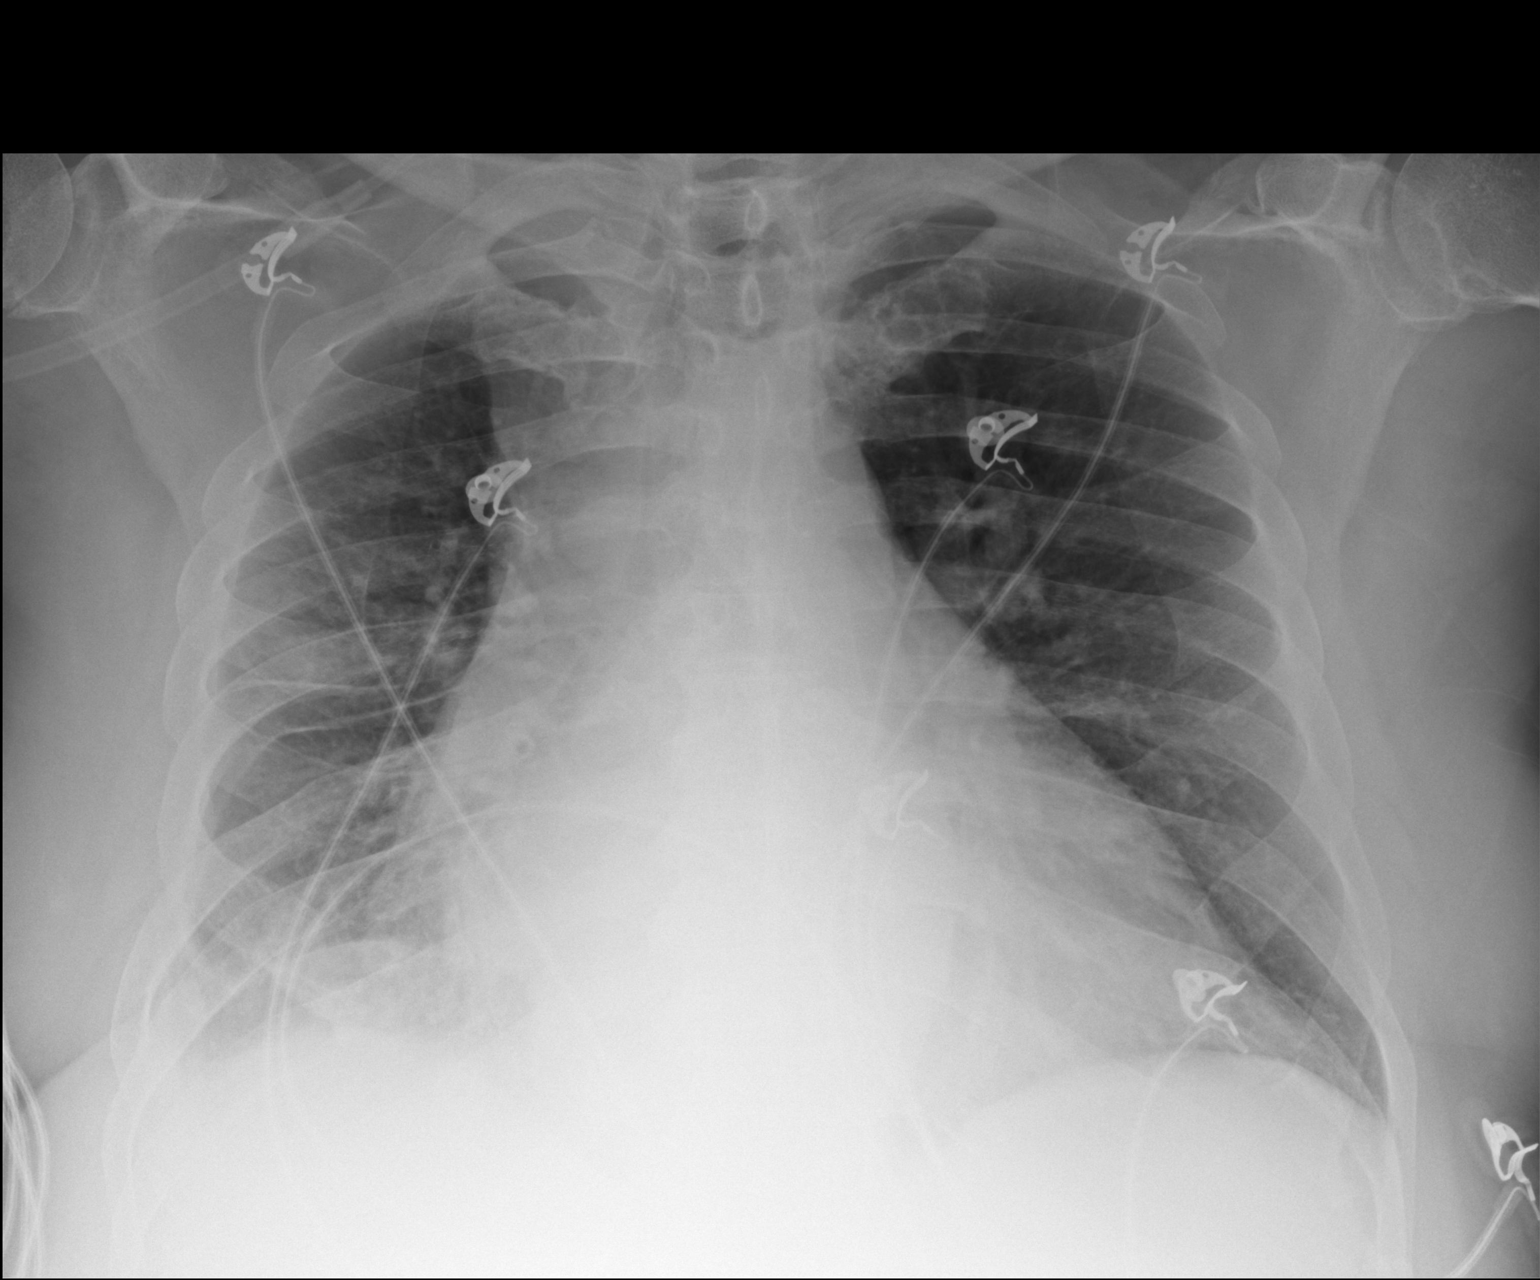

[1 of 1 positions shown; findings below may reference images not displayed]

FINDINGS: Cardiomegaly is noted. Degenerative changes bilateral shoulders. No
pulmonary edema. There is hazy right basilar atelectasis or early
infiltrate.
IMPRESSION: Cardiomegaly.  Hazy right basilar atelectasis or early infiltrate.

## 2015-09-30 MED ORDER — WARFARIN - PHARMACIST DOSING INPATIENT
Freq: Every day | Status: DC
  Administered 2015-10-02 – 2015-10-04 (×3)

## 2015-09-30 MED ORDER — DILTIAZEM HCL 60 MG PO TABS
120.0000 mg | ORAL_TABLET | Freq: Every day | ORAL | Status: DC
  Administered 2015-10-01: 120 mg via ORAL
  Filled 2015-09-30: qty 4

## 2015-09-30 MED ORDER — ALBUTEROL SULFATE (2.5 MG/3ML) 0.083% IN NEBU
2.5000 mg | INHALATION_SOLUTION | RESPIRATORY_TRACT | Status: DC
  Administered 2015-09-30: 2.5 mg via RESPIRATORY_TRACT
  Filled 2015-09-30: qty 3

## 2015-09-30 MED ORDER — FUROSEMIDE 10 MG/ML IJ SOLN
40.0000 mg | Freq: Once | INTRAMUSCULAR | Status: AC
  Administered 2015-09-30: 40 mg via INTRAVENOUS
  Filled 2015-09-30: qty 4

## 2015-09-30 MED ORDER — SODIUM CHLORIDE 0.9% FLUSH
3.0000 mL | Freq: Two times a day (BID) | INTRAVENOUS | Status: DC
  Administered 2015-09-30 – 2015-10-05 (×8): 3 mL via INTRAVENOUS

## 2015-09-30 MED ORDER — ALBUTEROL SULFATE (2.5 MG/3ML) 0.083% IN NEBU
5.0000 mg | INHALATION_SOLUTION | Freq: Once | RESPIRATORY_TRACT | Status: AC
  Administered 2015-09-30: 5 mg via RESPIRATORY_TRACT
  Filled 2015-09-30: qty 6

## 2015-09-30 MED ORDER — HYDRALAZINE HCL 50 MG PO TABS
50.0000 mg | ORAL_TABLET | Freq: Two times a day (BID) | ORAL | Status: DC
  Administered 2015-09-30 – 2015-10-05 (×10): 50 mg via ORAL
  Filled 2015-09-30 (×10): qty 1

## 2015-09-30 MED ORDER — PREDNISONE 50 MG PO TABS
60.0000 mg | ORAL_TABLET | Freq: Every day | ORAL | Status: DC
  Administered 2015-09-30 – 2015-10-03 (×3): 60 mg via ORAL
  Filled 2015-09-30 (×3): qty 1

## 2015-09-30 MED ORDER — FUROSEMIDE 10 MG/ML IJ SOLN
80.0000 mg | Freq: Two times a day (BID) | INTRAMUSCULAR | Status: DC
  Administered 2015-10-01 – 2015-10-04 (×7): 80 mg via INTRAVENOUS
  Filled 2015-09-30 (×7): qty 8

## 2015-09-30 MED ORDER — WARFARIN SODIUM 5 MG PO TABS
8.0000 mg | ORAL_TABLET | ORAL | Status: AC
  Administered 2015-10-01: 8 mg via ORAL
  Filled 2015-09-30: qty 1

## 2015-09-30 MED ORDER — ALBUTEROL SULFATE (2.5 MG/3ML) 0.083% IN NEBU
2.5000 mg | INHALATION_SOLUTION | Freq: Four times a day (QID) | RESPIRATORY_TRACT | Status: DC
  Administered 2015-10-01 – 2015-10-04 (×14): 2.5 mg via RESPIRATORY_TRACT
  Filled 2015-09-30 (×15): qty 3

## 2015-09-30 MED ORDER — HYDROCOD POLST-CPM POLST ER 10-8 MG/5ML PO SUER
5.0000 mL | Freq: Once | ORAL | Status: AC
  Administered 2015-09-30: 5 mL via ORAL
  Filled 2015-09-30: qty 5

## 2015-09-30 MED ORDER — METOPROLOL TARTRATE 25 MG PO TABS
25.0000 mg | ORAL_TABLET | Freq: Two times a day (BID) | ORAL | Status: DC
  Administered 2015-09-30 – 2015-10-01 (×2): 25 mg via ORAL
  Filled 2015-09-30 (×2): qty 1

## 2015-09-30 MED ORDER — POTASSIUM CHLORIDE CRYS ER 20 MEQ PO TBCR
20.0000 meq | EXTENDED_RELEASE_TABLET | Freq: Every day | ORAL | Status: DC
  Administered 2015-10-01 – 2015-10-05 (×5): 20 meq via ORAL
  Filled 2015-09-30 (×5): qty 1

## 2015-09-30 MED ORDER — LISINOPRIL 20 MG PO TABS
20.0000 mg | ORAL_TABLET | Freq: Every day | ORAL | Status: DC
  Administered 2015-10-01 – 2015-10-05 (×5): 20 mg via ORAL
  Filled 2015-09-30 (×5): qty 1

## 2015-09-30 MED ORDER — SENNOSIDES-DOCUSATE SODIUM 8.6-50 MG PO TABS: 1.0000 | ORAL_TABLET | Freq: Every evening | ORAL | Status: DC | PRN

## 2015-09-30 MED ORDER — LEVOFLOXACIN 750 MG PO TABS
750.0000 mg | ORAL_TABLET | ORAL | Status: AC
Start: 2015-09-30 — End: 2015-10-04
  Administered 2015-09-30 – 2015-10-04 (×5): 750 mg via ORAL
  Filled 2015-09-30 (×5): qty 1

## 2015-09-30 MED ORDER — POTASSIUM CHLORIDE CRYS ER 20 MEQ PO TBCR
20.0000 meq | EXTENDED_RELEASE_TABLET | Freq: Once | ORAL | Status: AC
  Administered 2015-09-30: 20 meq via ORAL
  Filled 2015-09-30: qty 1

## 2015-09-30 NOTE — ED Provider Notes (Signed)
Medical screening examination/treatment/procedure(s) were conducted as a shared visit with non-physician practitioner(s) and myself.  I personally evaluated the patient during the encounter.   EKG Interpretation   Date/Time:  Wednesday September 30 2015 13:13:19 EDT Ventricular Rate:  37 PR Interval:    QRS Duration: 104 QT Interval:  546 QTC Calculation: 428 R Axis:   -42 Text Interpretation:  Atrial fibrillation Left axis deviation Probable  anteroseptal infarct, old Borderline repolarization abnormality Unchanged  from [place] EKG Confirmed by Kaiyon Hynes  MD, [place] [phone]) on 09/30/2015  1:30:10 PM      Results for orders placed or performed during the hospital encounter of 09/30/15  CBC  Result Value Ref Range   WBC 4.6 4.0 - 10.5 K/uL   RBC 4.27 4.22 - 5.81 MIL/uL   Hemoglobin 8.1 (L) 13.0 - 17.0 g/dL   HCT 29.2 (L) 39.0 - 52.0 %   MCV 68.4 (L) 78.0 - 100.0 fL   MCH 19.0 (L) 26.0 - 34.0 pg   MCHC 27.7 (L) 30.0 - 36.0 g/dL   RDW 21.3 (H) 11.5 - 15.5 %   Platelets 150 150 - 400 K/uL  Comprehensive metabolic panel  Result Value Ref Range   Sodium 141 135 - 145 mmol/L   Potassium 3.6 3.5 - 5.1 mmol/L   Chloride 100 (L) 101 - 111 mmol/L   CO2 31 22 - 32 mmol/L   Glucose, Bld 95 65 - 99 mg/dL   BUN 12 6 - 20 mg/dL   Creatinine, Ser 1.20 0.61 - 1.24 mg/dL   Calcium 8.7 (L) 8.9 - 10.3 mg/dL   Total Protein 6.8 6.5 - 8.1 g/dL   Albumin 3.3 (L) 3.5 - 5.0 g/dL   AST 28 15 - 41 U/L   ALT 17 17 - 63 U/L   Alkaline Phosphatase 98 38 - 126 U/L   Total Bilirubin 1.9 (H) 0.3 - 1.2 mg/dL   GFR calc non Af Amer >60 >60 mL/min   GFR calc Af Amer >60 >60 mL/min   Anion gap 10 5 - 15  Protime-INR  Result Value Ref Range   Prothrombin Time 22.9 (H) 11.6 - 15.2 seconds   INR 2.04 (H) 0.00 - 1.49  Brain natriuretic peptide  Result Value Ref Range   B Natriuretic Peptide 599.0 (H) 0.0 - 100.0 pg/mL  Troponin I  Result Value Ref Range   Troponin I <0.03 <0.031 ng/mL   Results for  orders placed or performed during the hospital encounter of 09/30/15  CBC  Result Value Ref Range   WBC 4.6 4.0 - 10.5 K/uL   RBC 4.27 4.22 - 5.81 MIL/uL   Hemoglobin 8.1 (L) 13.0 - 17.0 g/dL   HCT 29.2 (L) 39.0 - 52.0 %   MCV 68.4 (L) 78.0 - 100.0 fL   MCH 19.0 (L) 26.0 - 34.0 pg   MCHC 27.7 (L) 30.0 - 36.0 g/dL   RDW 21.3 (H) 11.5 - 15.5 %   Platelets 150 150 - 400 K/uL  Comprehensive metabolic panel  Result Value Ref Range   Sodium 141 135 - 145 mmol/L   Potassium 3.6 3.5 - 5.1 mmol/L   Chloride 100 (L) 101 - 111 mmol/L   CO2 31 22 - 32 mmol/L   Glucose, Bld 95 65 - 99 mg/dL   BUN 12 6 - 20 mg/dL   Creatinine, Ser 1.20 0.61 - 1.24 mg/dL   Calcium 8.7 (L) 8.9 - 10.3 mg/dL   Total Protein 6.8 6.5 - 8.1 g/dL  Albumin 3.3 (L) 3.5 - 5.0 g/dL   AST 28 15 - 41 U/L   ALT 17 17 - 63 U/L   Alkaline Phosphatase 98 38 - 126 U/L   Total Bilirubin 1.9 (H) 0.3 - 1.2 mg/dL   GFR calc non Af Amer >60 >60 mL/min   GFR calc Af Amer >60 >60 mL/min   Anion gap 10 5 - 15  Protime-INR  Result Value Ref Range   Prothrombin Time 22.9 (H) 11.6 - 15.2 seconds   INR 2.04 (H) 0.00 - 1.49  Brain natriuretic peptide  Result Value Ref Range   B Natriuretic Peptide 599.0 (H) 0.0 - 100.0 pg/mL  Troponin I  Result Value Ref Range   Troponin I <0.03 <0.031 ng/mL   Dg Chest Portable 1 View  09/30/2015  CLINICAL DATA:  Shortness of breath starting last night EXAM: PORTABLE CHEST 1 VIEW COMPARISON:  08/05/2015 FINDINGS: Cardiomegaly is noted. Degenerative changes bilateral shoulders. No pulmonary edema. There is hazy right basilar atelectasis or early infiltrate. IMPRESSION: Cardiomegaly.  Hazy right basilar atelectasis or early infiltrate. Electronically Signed   By: Lahoma Crocker M.D.   On: 09/30/2015 14:06       Patient brought in by EMS for shortness of breath that started last night and got worse this morning. Patient unable to sleep last night due to shortness of breath. Patient has been admitted in  January and February for congestive heart failure. EMS gave patient 2 DuoNeb treatments and 125 mg aside a Medrol in route because he was wheezing. The patient doesn't normally have a history of wheezing. Clinically suspect that he is having worsening CHF. Does have bilateral leg swelling. Lungs with some slight wheezing currently. Chest x-ray negative for overt pulmonary edema patient's BMPs are elevated in the range where they were being needed admission before. Patient's hemoglobin is a little bit lower but still above 8 this could be contributing to her shortness of breath. Patient's cardiologist is Dr. Lovena Le. Patient will still require admission. January admission was done by hospitalist and February admission was done by cardiology.    Patient's initial troponin was normal. Patient's chest x-ray does raise concern perhaps for an early infiltrate on the right side. Patient not febrile does not have significant leukocytosis. EKG showed persistent atrial fibrillation which she's had before. Patient is on Coumadin. INR is in the 2 range. So technically therapeutic.   Clinically still suspicious that this may be an exacerbation of his CHF. Certainly he does need admission. He is requiring oxygen does not normally have oxygen at home. Sats off of oxygen going down to 88%.   Fredia Sorrow, MD 09/30/15 [phone]

## 2015-09-30 NOTE — ED Notes (Signed)
SOB starting last night; worsened throughout night and morning. Unable to sleep d/t SOB. History CHF. EMS gave 2 duoneb treatments and 125mg  of solumedrol. Reports dry cough for two days.

## 2015-09-30 NOTE — H&P (Addendum)
[facility] History and Physical  Steven Barnett [id number] DOB: 1950/07/29 DOA: 09/30/2015  65 year old male Chronic diastolic heart failure-EF 55-60% 07/2015 baseline hemoglobin 10-11 range in January 2017 OSA not on CPAP  on Revatio ventral hernia History of polysubstance abuse 2008 Hypertension\ history of right-sided thoracotomy for empyema 11/2008-Permanent atrial fibrillation since that time Body mass index is 39.03 kg/(m^2).  mild thrombocytopenia Microcytic anemia    Patient presented to the emergency room 09/30/15 after 3-4 nights of poor sleep, cough without right wrist chills nor fever He denies any sputum, air pain, difficulty swallowing, myalgias. He states that family members in his household have been treated for respiratory illnesses with antibiotics-he is not sure exactly what. He does not have any burning in his urine nor does he have any subjective fever and did not feel the need to measure He was a former smoker up until 4-5 years ago -marijuana He also has a former history of polysubstance abuse  He denies any diarrhea which would go with findings of a viral illness, and denies any other specific complaint He states however that he has had difficulty getting to sleep and tells me that during one of his hospitalizations recently he was told he may require CPAP-he never got a referral or a CPAP machine. He was able to use a CPAP machine in the hospital with good benefit   On admission noted  Bradycardic and hypotensive 96/50 on admit Wbc 4.6 Hemoglobin 8.1 [baseline 10] MCV 68 PLT 150 Bun/creat 12/1.2 Bili 1.9  CXr 1 view ? Infiltrate    2 breathing Rx in EMS-Sats ? 80's to 94 Given another neb + 40 of lasix     Past Medical History  Diagnosis Date  . Hypertension   . Permanent atrial fibrillation ([place]) 2010  . Chronic anticoagulation 2010    Coumadin  . OSA (obstructive sleep apnea) 2016    Not on CPAP  . Chronic diastolic CHF  (congestive heart failure), NYHA class 2 [facility])    Past Surgical History  Procedure Laterality Date  . Thoracotomy Right 2010   Social History:  Social History   Social History Narrative    No Known Allergies  Family History  Problem Relation Age of Onset  . Hypertension Mother   . Diabetes Mother   . Hyperlipidemia Mother   . Heart disease Father   . Heart failure Father   . Heart attack Neg Hx   . Stroke Maternal Grandmother   . Stroke Sister     Prior to Admission medications   Medication Sig Start Date End Date Taking? Authorizing Provider  acetaminophen (TYLENOL) 500 MG tablet Take 2,000 mg by mouth 2 (two) times daily.   Yes Historical Provider, MD  aspirin 81 MG tablet Take 81 mg by mouth daily.   Yes Historical Provider, MD  diltiazem (CARDIZEM) 120 MG tablet Take 1 tablet (120 mg total) by mouth daily. 09/02/15  Yes Pixie Casino, MD  docusate sodium (COLACE) 100 MG capsule Take 1 capsule (100 mg total) by mouth 2 (two) times daily. For constipation 07/12/15  Yes Ripudeep Krystal Eaton, MD  furosemide (LASIX) 80 MG tablet Take 1 tablet (80 mg total) by mouth 2 (two) times daily. 08/08/15  Yes Bhavinkumar Bhagat, PA  hydrALAZINE (APRESOLINE) 50 MG tablet TAKE ONE TABLET BY MOUTH TWICE DAILY 09/03/15  Yes Pixie Casino, MD  ipratropium (ATROVENT HFA) 17 MCG/ACT inhaler Inhale 2 puffs into the lungs as needed for wheezing.   Yes Historical Provider,  MD  lisinopril (PRINIVIL,ZESTRIL) 20 MG tablet TAKE ONE TABLET BY MOUTH ONCE DAILY 04/20/15  Yes Pixie Casino, MD  metoprolol tartrate (LOPRESSOR) 50 MG tablet Take 1 tablet (50 mg total) by mouth 2 (two) times daily. 09/02/15  Yes Pixie Casino, MD  potassium chloride (K-DUR,KLOR-CON) 20 MEQ tablet Take 1 tablet (20 mEq total) by mouth daily. 07/13/15  Yes Ripudeep Krystal Eaton, MD  sildenafil (REVATIO) 20 MG tablet TAKE TWO TO THREE TABLETS BY MOUTH ONCE DAILY AS NEEDED FOR  SEXUAL  ACTIVITY 09/14/15  Yes Pixie Casino, MD  warfarin  (COUMADIN) 4 MG tablet Take 2 tablets by mouth daily or as directed by coumadin clinic  Take Coumadin total 12mg  tonight 08/08/15. 08/08/15  Yes Bhavinkumar Bhagat, PA   Physical Exam: Filed Vitals:   09/30/15 1630 09/30/15 1700 09/30/15 1730 09/30/15 1800  BP: 122/79 131/81 130/87 140/92  Pulse: 50 71 64 62  Temp:      TempSrc:      Resp: 16 22 22 17   Height:      Weight:      SpO2: 99% 96% 97% 98%       EOMI tired slightly increased work of breathing JVD 5-7 cm above sternal angle, no bruit, Mallampati 4, Patient has coarse crackles bilaterally No abdominal discomfort on pressure Pulses are normal bilaterally lower extremities, trace edema only. Range of motion is intact power 5/5.    Labs on Admission:  Basic Metabolic Panel:  Recent Labs Lab 09/30/15 1342  NA 141  K 3.6  CL 100*  CO2 31  GLUCOSE 95  BUN 12  CREATININE 1.20  CALCIUM 8.7*   Liver Function Tests:  Recent Labs Lab 09/30/15 1342  AST 28  ALT 17  ALKPHOS 98  BILITOT 1.9*  PROT 6.8  ALBUMIN 3.3*   No results for input(s): LIPASE, AMYLASE in the last 168 hours. No results for input(s): AMMONIA in the last 168 hours. CBC:  Recent Labs Lab 09/30/15 1342  WBC 4.6  HGB 8.1*  HCT 29.2*  MCV 68.4*  PLT 150   Cardiac Enzymes:  Recent Labs Lab 09/30/15 1356  TROPONINI <0.03    BNP (last 3 results)  Recent Labs  07/08/15 1601 08/05/15 1055 09/30/15 1443  BNP 569.0* 571.9* 599.0*    ProBNP (last 3 results) No results for input(s): PROBNP in the last 8760 hours.  CBG: No results for input(s): GLUCAP in the last 168 hours.  Radiological Exams on Admission: Dg Chest Portable 1 View  09/30/2015  CLINICAL DATA:  Shortness of breath starting last night EXAM: PORTABLE CHEST 1 VIEW COMPARISON:  08/05/2015 FINDINGS: Cardiomegaly is noted. Degenerative changes bilateral shoulders. No pulmonary edema. There is hazy right basilar atelectasis or early infiltrate. IMPRESSION:  Cardiomegaly.  Hazy right basilar atelectasis or early infiltrate. Electronically Signed   By: Lahoma Crocker M.D.   On: 09/30/2015 14:06    EKG: Independently reviewed. Afib, no acute changes  Assessment/Plan Multifactorial acute hypoxic respiratory failure dyspnea-patient has questionable infiltrate on one view chest x-ray we will get a 2 view, it is highly unlikely given his history that there is any other cause life-threatening such as PE or aortic dissection given his symptomatology  Pneumonia-start community-acquired treatment with Levaquin by mouth 500 daily for 5 days. Monitor for effect in a.m.,  ? Possible underlying COPD-chest x-ray reviewed by me not very impressive for hyperinflation-give nebulizations with albuterol every 4 scheduled and start prednisone 60 and monitor for effect  Mild  decompensated diastolic heart failure EF 55-60 07/2015-pro BNP is 599 but this is not sensitive in an obese patient patient HOwever-has JVD and elevated bilirubin concerning for central venous congestion of the liver-diuresis with Lasix 80 mg IV twice a day, appreciate cardiology input-in addition will cut back by mouth metoprolol from 50 twice a day to 25 twice a day to allow for adequate diuresis  Obstructive sleep apnea not on BiPAP or CPAP-would consider auto Pap in the next 24-48 hours daily at bedtime to see if this benefits him, case management to be involved in procuring this  Restrictive lung disease component given habitus-needs weight loss Body mass index is 39.03 kg/(m^2).   History of congestive heart failure-continue above medications in addition to hydralazine 50 twice a day, monitor daily weeks' strict ins and outs Place Foley  Permanent atrial fibrillation-continue above medications with changes, continue home dosage of Coumadin--PharmD to assist with dosing  Probable cirrhosis versus central venous congestion-check a.m. LFTs, INR  History hypertension, continue lisinopril 20  daily  Prior history CAD-continue above meds, continue aspirin 81 daily  Anemia, severely microcytic-agree with Hemoccult stool, continue aspirin, possible component of decompensated heart failure from this?     Full code, no family present, inpatient pending resolution Time spent: [place], [facility] Pager [phone]  If 7PM-7AM, please contact night-coverage [email or web address] Password [facility] 09/30/2015, 6:37 PM

## 2015-09-30 NOTE — ED Notes (Signed)
Cardiology at bedside.

## 2015-09-30 NOTE — ED Provider Notes (Signed)
CSN: [id number]     Arrival date & time 09/30/15  1300 History  By signing my name below, I, Eustaquio Maize, attest that this documentation has been prepared under the direction and in the presence of Alyse Low, [place].  Electronically Signed: Eustaquio Maize, ED Scribe. 09/30/2015. 1:23 PM.   Chief Complaint  Patient presents with  . Shortness of Breath   The history is provided by the patient. No language interpreter was used.     HPI Comments: Zannie Lopardo is a 65 y.o. male brought in by ambulance, with PMHx HTN, atrial fibrillation, OSA, and CHF who presents to the Emergency Department complaining of gradual onset, constant, shortness of breath that began yesterday. Pt's O2 saturation was 88% on RA when EMS arrived. He was given 2 breathing treatments and 125 mg solumedrol en route without relief. Pt is not O2 dependent at home. He also complains of a dry cough for the past 2 days. Denies chest pain, leg swelling, or any other associated symptoms. Pt is on Coumadin for atrial fibrillation.   Past Medical History  Diagnosis Date  . Hypertension   . Permanent atrial fibrillation ([place]) 2010  . Chronic anticoagulation 2010    Coumadin  . OSA (obstructive sleep apnea) 2016    Not on CPAP  . Chronic diastolic CHF (congestive heart failure), NYHA class 2 [facility])    Past Surgical History  Procedure Laterality Date  . Thoracotomy Right 2010   Family History  Problem Relation Age of Onset  . Hypertension Mother   . Diabetes Mother   . Hyperlipidemia Mother   . Heart disease Father   . Heart failure Father   . Heart attack Neg Hx   . Stroke Maternal Grandmother   . Stroke Sister    Social History  Substance Use Topics  . Smoking status: Former Smoker    Quit date: 12/16/1974  . Smokeless tobacco: Never Used  . Alcohol Use: No    Review of Systems  Constitutional: Negative for fever.  Respiratory: Positive for cough and shortness of breath.   Cardiovascular: Negative for chest  pain and leg swelling.  All other systems reviewed and are negative.  Allergies  Review of patient's allergies indicates no known allergies.  Home Medications   Prior to Admission medications   Medication Sig Start Date End Date Taking? Authorizing Provider  acetaminophen (TYLENOL) 500 MG tablet Take 2,000 mg by mouth 2 (two) times daily.    Historical Provider, MD  aspirin 81 MG tablet Take 81 mg by mouth daily.    Historical Provider, MD  diltiazem (CARDIZEM) 120 MG tablet Take 1 tablet (120 mg total) by mouth daily. 09/02/15   Pixie Casino, MD  docusate sodium (COLACE) 100 MG capsule Take 1 capsule (100 mg total) by mouth 2 (two) times daily. For constipation 07/12/15   Ripudeep Krystal Eaton, MD  furosemide (LASIX) 80 MG tablet Take 1 tablet (80 mg total) by mouth 2 (two) times daily. 08/08/15   Bhavinkumar Bhagat, PA  hydrALAZINE (APRESOLINE) 50 MG tablet TAKE ONE TABLET BY MOUTH TWICE DAILY 09/03/15   Pixie Casino, MD  ipratropium (ATROVENT HFA) 17 MCG/ACT inhaler Inhale 2 puffs into the lungs as needed for wheezing.    Historical Provider, MD  lisinopril (PRINIVIL,ZESTRIL) 20 MG tablet TAKE ONE TABLET BY MOUTH ONCE DAILY 04/20/15   Pixie Casino, MD  metoprolol tartrate (LOPRESSOR) 50 MG tablet Take 1 tablet (50 mg total) by mouth 2 (two) times daily. 09/02/15  Pixie Casino, MD  potassium chloride (K-DUR,KLOR-CON) 20 MEQ tablet Take 1 tablet (20 mEq total) by mouth daily. 07/13/15   Ripudeep Krystal Eaton, MD  sildenafil (REVATIO) 20 MG tablet TAKE TWO TO THREE TABLETS BY MOUTH ONCE DAILY AS NEEDED FOR  SEXUAL  ACTIVITY 09/14/15   Pixie Casino, MD  warfarin (COUMADIN) 4 MG tablet Take 2 tablets by mouth daily or as directed by coumadin clinic  Take Coumadin total 12mg  tonight 08/08/15. 08/08/15   Bhavinkumar Bhagat, PA   BP 93/52 mmHg  Pulse 56  Temp(Src) 97.7 F (36.5 C) (Oral)  Resp 23  Ht 5\' 10"  (1.778 m)  Wt 272 lb (123.378 kg)  BMI 39.03 kg/m2  SpO2 98%   Physical Exam  Constitutional:  He is oriented to person, place, and time. He appears well-developed and well-nourished. No distress.  HENT:  Head: Normocephalic and atraumatic.  Eyes: Conjunctivae and EOM are normal.  Neck: Neck supple. No tracheal deviation present.  Cardiovascular: Normal rate and regular rhythm.   Pulmonary/Chest: Effort normal. No respiratory distress. He has wheezes. He has no rhonchi. He has no rales.  Decreased air movement throughout.  Musculoskeletal: Normal range of motion. He exhibits no edema.  Neurological: He is alert and oriented to person, place, and time.  Skin: Skin is warm and dry.  Psychiatric: He has a normal mood and affect. His behavior is normal.  Nursing note and vitals reviewed.   ED Course  Procedures (including critical care time)  DIAGNOSTIC STUDIES: Oxygen Saturation is 88% on room air my interpretation.  Improved to 94% on 02 COORDINATION OF CARE: 1:20 PM-Discussed treatment plan with pt at bedside and pt agreed to plan.   Labs Review Labs Reviewed  CBC - Abnormal; Notable for the following:    Hemoglobin 8.1 (*)    HCT 29.2 (*)    MCV 68.4 (*)    MCH 19.0 (*)    MCHC 27.7 (*)    RDW 21.3 (*)    All other components within normal limits  PROTIME-INR - Abnormal; Notable for the following:    Prothrombin Time 22.9 (*)    INR 2.04 (*)    All other components within normal limits  COMPREHENSIVE METABOLIC PANEL    Imaging Review Dg Chest Portable 1 View  09/30/2015  CLINICAL DATA:  Shortness of breath starting last night EXAM: PORTABLE CHEST 1 VIEW COMPARISON:  08/05/2015 FINDINGS: Cardiomegaly is noted. Degenerative changes bilateral shoulders. No pulmonary edema. There is hazy right basilar atelectasis or early infiltrate. IMPRESSION: Cardiomegaly.  Hazy right basilar atelectasis or early infiltrate. Electronically Signed   By: Lahoma Crocker M.D.   On: 09/30/2015 14:06   I have personally reviewed and evaluated these images and lab results as part of my medical  decision-making.   EKG Interpretation   Date/Time:  Wednesday September 30 2015 13:13:19 EDT Ventricular Rate:  37 PR Interval:    QRS Duration: 104 QT Interval:  546 QTC Calculation: 428 R Axis:   -42 Text Interpretation:  Atrial fibrillation Left axis deviation Probable  anteroseptal infarct, old Borderline repolarization abnormality Unchanged  from [place] EKG Confirmed by ZACKOWSKI  MD, SCOTT [id number]) on 09/30/2015  1:30:10 PM      MDM   Final diagnoses:  Acute on chronic diastolic congestive heart failure (HCC)  Anemia, unspecified anemia type  Shortness of breath    Cardiology will see here and evaluate.  Pt feels better after iv lasix and albuterol nebs x 3.  Pt reports he is hungry.   Cardiology evaluated.  I spoke to PA who advised they do not feel pt has acute heart failure, possible early pneumonia.  Hospitalist admission advised. Cardiology advised they will continue to follow.     [place], PA-C 09/30/15 1806

## 2015-09-30 NOTE — Consult Note (Signed)
Cardiology Consult    Patient ID: Steven Barnett MRN: [id number], DOB/AGE: 09-05-1950   Date of Consult: 09/30/2015   Primary Physician: Elisabeth Cara, PA-C Primary Cardiologist: Dr. [name] Reason for Consult: Shortness of Breath Requesting Provider: Dr. Rogene Houston  History of Present Illness    Steven Barnett is a 65 y.o. male with past medical history of chronic diastolic CHF (EF 0000000 by echo in 07/2015), permanent atrial fibrillation (on Coumadin), HTN, and OSA (not on CPAP) who presents to [facility] ED on 09/30/2015 for worsening shortness of breath.  He was recently hospitalized from 08/05/2015 - 08/08/2015 for acute on chronic diastolic CHF. His weight was 272 lbs at time of discharge and he was instructed to continue taking Lasix 80mg  BID. He was seen in the office by Dr. [name] on 09/03/2015 and reported not being fully compliant with his medications, only taking Lopressor once daily instead of BID. His weight was 273 lbs at the time of his appointment. Was continued on the Lasix 80mg  BID dosing at that time.  In talking with the patient, he reports a decreased appetite since Monday saying he has not consumed any food since then. Reports being unable to sleep at night due to his productive cough and has noticed increased fatigue. Has been coughing up yellow phlegm and reports significant sinus congestion. Becomes short of breath with his coughing spells. Reports good compliance with his medications despite having nausea and vomiting. He weighs at home daily and says his weight was 272 lbs today (which is his baseline). Denies any recent PND or lower extremity edema. Has 4-5 pillow orthopnea at baseline, saying "I've always slept with a lot of pillows". Says his wife was diagnosed with PNA last week and had similar symptoms.  While he reports being compliant with daily weights and his medications, he also mentions he last meal on Monday was "a hot dog and lemonade". Mentions his wife  brought him Chicken Parmesan from [employer] last night but he did not consume any due to not having an appetite  While in the ED, his BNP was found to be elevated to 599 (similar to previous values over the past two months). Initial troponin negative. WBC 4.6. Hgb 8.1. Platelets 150.Creatinine 1.20 (elevated from 1.01 one month ago). CXR shows cardiomegaly with hazy right basilar atelectasis or early infiltrate.   Past Medical History    Past Medical History  Diagnosis Date  . Hypertension   . Permanent atrial fibrillation ([place]) 2010  . Chronic anticoagulation 2010    Coumadin  . OSA (obstructive sleep apnea) 2016    Not on CPAP  . Chronic diastolic CHF (congestive heart failure), NYHA class 2 [facility])     Past Surgical History  Procedure Laterality Date  . Thoracotomy Right 2010     Allergies  No Known Allergies   Home Medications    Prior to Admission medications   Medication Sig Start Date End Date Taking? Authorizing Provider  acetaminophen (TYLENOL) 500 MG tablet Take 2,000 mg by mouth 2 (two) times daily.   Yes Historical Provider, MD  aspirin 81 MG tablet Take 81 mg by mouth daily.   Yes Historical Provider, MD  diltiazem (CARDIZEM) 120 MG tablet Take 1 tablet (120 mg total) by mouth daily. 09/02/15  Yes Pixie Casino, MD  docusate sodium (COLACE) 100 MG capsule Take 1 capsule (100 mg total) by mouth 2 (two) times daily. For constipation 07/12/15  Yes Ripudeep Krystal Eaton, MD  furosemide (LASIX)  80 MG tablet Take 1 tablet (80 mg total) by mouth 2 (two) times daily. 08/08/15  Yes Bhavinkumar Bhagat, PA  hydrALAZINE (APRESOLINE) 50 MG tablet TAKE ONE TABLET BY MOUTH TWICE DAILY 09/03/15  Yes Pixie Casino, MD  ipratropium (ATROVENT HFA) 17 MCG/ACT inhaler Inhale 2 puffs into the lungs as needed for wheezing.   Yes Historical Provider, MD  lisinopril (PRINIVIL,ZESTRIL) 20 MG tablet TAKE ONE TABLET BY MOUTH ONCE DAILY 04/20/15  Yes Pixie Casino, MD  metoprolol tartrate (LOPRESSOR)  50 MG tablet Take 1 tablet (50 mg total) by mouth 2 (two) times daily. 09/02/15  Yes Pixie Casino, MD  potassium chloride (K-DUR,KLOR-CON) 20 MEQ tablet Take 1 tablet (20 mEq total) by mouth daily. 07/13/15  Yes Ripudeep K Rai, MD  sildenafil (REVATIO) 20 MG tablet TAKE TWO TO THREE TABLETS BY MOUTH ONCE DAILY AS NEEDED FOR  SEXUAL  ACTIVITY 09/14/15  Yes Pixie Casino, MD  warfarin (COUMADIN) 4 MG tablet Take 2 tablets by mouth daily or as directed by coumadin clinic  Take Coumadin total 12mg  tonight 08/08/15. 08/08/15  Yes Leanor Kail, PA    Family History    Family History  Problem Relation Age of Onset  . Hypertension Mother   . Diabetes Mother   . Hyperlipidemia Mother   . Heart disease Father   . Heart failure Father   . Heart attack Neg Hx   . Stroke Maternal Grandmother   . Stroke Sister     Social History    Social History   Social History  . Marital Status: Married    Spouse Name: N/A  . Number of Children: N/A  . Years of Education: N/A   Occupational History  . Not on file.   Social History Main Topics  . Smoking status: Former Smoker    Quit date: 12/16/1974  . Smokeless tobacco: Never Used  . Alcohol Use: No  . Drug Use: No     Comment: Hx polysubstance abuse, quit 2008  . Sexual Activity: Not on file   Other Topics Concern  . Not on file   Social History Narrative     Review of Systems    General:  No chills, fever, night sweats or weight changes. Positive for fatigue and decreased appetite. Cardiovascular:  No chest pain, dyspnea on exertion, edema, palpitations, paroxysmal nocturnal dyspnea. Positive for baseline orthopnea. Dermatological: No rash, lesions/masses Respiratory: Positive for productive cough and dyspnea. Urologic: No hematuria, dysuria Abdominal:   No diarrhea, bright red blood per rectum, melena, or hematemesis. Positive for nausea and vomiting. Neurologic:  No visual changes, wkns, changes in mental status. All other  systems reviewed and are otherwise negative except as noted above.  Physical Exam    Blood pressure 121/77, pulse 55, temperature 97.7 F (36.5 C), temperature source Oral, resp. rate 21, height 5\' 10"  (1.778 m), weight 272 lb (123.378 kg), SpO2 95 %.  General: Obese African American,male in no acute distress. Head: Normocephalic, atraumatic, sclera non-icteric, no xanthomas, nares are without discharge. Dentition:  Neck: No carotid bruits. JVD not elevated.  Lungs: Respirations regular and unlabored, no rales appreciated. Mild expiratory wheeze noted.  Heart: Irregularly irregular. No S3 or S4.  No murmur, no rubs, or gallops appreciated. Abdomen: Soft, non-tender, non-distended with normoactive bowel sounds. No hepatomegaly. No rebound/guarding. No obvious abdominal masses. Msk:  Strength and tone appear normal for age. No joint deformities or effusions. Extremities: No clubbing or cyanosis. No edema.  Distal pedal pulses  are 2+ bilaterally. Neuro: Alert and oriented X 3. Moves all extremities spontaneously. No focal deficits noted. Psych:  Responds to questions appropriately with a normal affect. Skin: No rashes or lesions noted  Labs    Troponin (Point of Care Test) No results for input(s): TROPIPOC in the last 72 hours.  Recent Labs  09/30/15 1356  TROPONINI <0.03   Lab Results  Component Value Date   WBC 4.6 09/30/2015   HGB 8.1* 09/30/2015   HCT 29.2* 09/30/2015   MCV 68.4* 09/30/2015   PLT 150 09/30/2015    Recent Labs Lab 09/30/15 1342  NA 141  K 3.6  CL 100*  CO2 31  BUN 12  CREATININE 1.20  CALCIUM 8.7*  PROT 6.8  BILITOT 1.9*  ALKPHOS 98  ALT 17  AST 28  GLUCOSE 95   Lab Results  Component Value Date   DDIMER 0.32 07/08/2015     B NATRIURETIC PEPTIDE  Date/Time Value Ref Range Status  09/30/2015 02:43 PM 599.0* 0.0 - 100.0 pg/mL Final  08/05/2015 10:55 AM 571.9* 0.0 - 100.0 pg/mL Final  07/08/2015 04:01 PM 569.0* 0.0 - 100.0 pg/mL Final    PRO B NATRIURETIC PEPTIDE (BNP)  Date/Time Value Ref Range Status  01/07/2009 01:10 PM 196.0* 0.0 - 100.0 pg/mL Final  11/20/2008 04:20 AM 131.0* 0.0 - 100.0 pg/mL Final    Recent Labs  09/30/15 1342  INR 2.04*    Radiology Studies    Dg Chest Portable 1 View: 09/30/2015  CLINICAL DATA:  Shortness of breath starting last night EXAM: PORTABLE CHEST 1 VIEW COMPARISON:  08/05/2015 FINDINGS: Cardiomegaly is noted. Degenerative changes bilateral shoulders. No pulmonary edema. There is hazy right basilar atelectasis or early infiltrate. IMPRESSION: Cardiomegaly.  Hazy right basilar atelectasis or early infiltrate. Electronically Signed   By: Lahoma Crocker M.D.   On: 09/30/2015 14:06    EKG & Cardiac Imaging    EKG: Atrial Fibrillation, HR 37, No acute ST or T-wave changes.  ECHOCARDIOGRAM: 07/09/2015 Study Conclusions - Left ventricle: The cavity size was normal. Wall thickness was  increased in a pattern of moderate LVH. Systolic function was  normal. The estimated ejection fraction was in the range of 55%  to 60%. Wall motion was normal; there were no regional wall  motion abnormalities. The study is not technically sufficient to  allow evaluation of LV diastolic function. - Aortic valve: Trileaflet. Sclerosis without stenosis. There was  no regurgitation. - Mitral valve: There was mild regurgitation. - Left atrium: Moderately dilated at 45 ml/m2. - Right ventricle: The cavity size was mildly dilated. - Right atrium: Moderate to severely dilated. - Inferior vena cava: The vessel was normal in size. The  respirophasic diameter changes were in the normal range (>= 50%),  consistent with normal central venous pressure.  Impressions: - Compared to the prior echo in 2010, LVEF remains unchanged.  Assessment & Plan    1. Shortness of Breath/ Productive Cough - reports shortness of breath and a productive cough with yellow phlegm for the past 3 days. Has associated  decreased appetite with nausea and vomiting. Reports good medication complince despite this. - BNP at 599 (similar to previous values). CXR showing cardiomegaly with hazy right basilar atelectasis or early infiltrate  - he does not appear to be in acute heart failure. Overall, his symptoms seem most concerning for PNA, especially with sick contacts in his household.  2. Chronic diastolic CHF  - EF 0000000 by echo in 07/2015.  - weight  is at baseline (272 lbs). Does not appear to be in acute CHF at this time. - compliance with a low-sodium - continue BB, ACE-I, Lasix, and potassium supplementation.  3. Permanent atrial fibrillation - This patients CHA2DS2-VASc Score and unadjusted Ischemic Stroke Rate (% per year) is equal to 2.2 % stroke rate/year from a score of 2 (CHF, HTN). Continue Coumadin. - Cardizem and BB for rate control.  Signed, Erma Heritage, PA-C 09/30/2015, 3:50 PM Pager: [phone]  I have personally seen and examined this patient with Bernerd Pho, PA-C. I agree with the assessment and plan as outlined above. He has known persistent atrial fib and chronic diastolic CHF. He is presenting today with productive cough, fatigue, no weight gain, no LE edema. Multiple sick family members. Wife has pneumonia. Exam shows lungs are clear, heart is irregular, no LE edema. Chest x-ray with possible early pneumonia. He does not appear to be fluid overloaded. I do not think he has acute CHF. This appears to be CAP. Would consider outpatient treatment for CAP or consultation with IM.   Eeva Schlosser 09/30/2015 5:36 PM

## 2015-09-30 NOTE — ED Notes (Signed)
Patient transported to X-ray 

## 2015-09-30 NOTE — Progress Notes (Signed)
ANTICOAGULATION CONSULT NOTE - Initial Consult  Pharmacy Consult:  Coumadin Indication: atrial fibrillation  No Known Allergies  Patient Measurements: Height: 5\' 10"  (177.8 cm) Weight: 271 lb 11.2 oz (123.242 kg) IBW/kg (Calculated) : 73  Vital Signs: Temp: 97.7 F (36.5 C) (03/29 1959) Temp Source: Oral (03/29 1959) BP: 145/95 mmHg (03/29 1959) Pulse Rate: 64 (03/29 1959)  Labs:  Recent Labs  09/29/15 0943 09/30/15 1342 09/30/15 1356  HGB  --  8.1*  --   HCT  --  29.2*  --   PLT  --  150  --   LABPROT  --  22.9*  --   INR 1.9 2.04*  --   CREATININE  --  1.20  --   TROPONINI  --   --  <0.03    Estimated Creatinine Clearance: 83 mL/min (by C-G formula based on Cr of 1.2).   Medical History: Past Medical History  Diagnosis Date  . Hypertension   . Permanent atrial fibrillation ([place]) 2010  . Chronic anticoagulation 2010    Coumadin  . OSA (obstructive sleep apnea) 2016    Not on CPAP  . Chronic diastolic CHF (congestive heart failure), NYHA class 2 ([facility])        Assessment: 64 YOM to continue on Coumadin from PTA for history of AFib.  INR is therapeutic on admission and patient states he hasn't taken today's dose.  No bleeding reported.   Goal of Therapy:  INR 2-3    Plan:  - Coumadin 8mg  PO today - Daily PT / INR - Watch for DDI with Levaquin   Steven Barnett D. Mina Marble, PharmD, BCPS Pager:  [phone] 09/30/2015, 8:33 PM

## 2015-10-01 ENCOUNTER — Encounter (HOSPITAL_COMMUNITY): Payer: Self-pay | Admitting: General Practice

## 2015-10-01 LAB — PROTIME-INR
INR: 2.37 — ABNORMAL HIGH (ref 0.00–1.49)
PROTHROMBIN TIME: 25.7 s — AB (ref 11.6–15.2)

## 2015-10-01 LAB — COMPREHENSIVE METABOLIC PANEL
ALK PHOS: 98 U/L (ref 38–126)
ALT: 19 U/L (ref 17–63)
ANION GAP: 11 (ref 5–15)
AST: 29 U/L (ref 15–41)
Albumin: 3.2 g/dL — ABNORMAL LOW (ref 3.5–5.0)
BILIRUBIN TOTAL: 1.8 mg/dL — AB (ref 0.3–1.2)
BUN: 15 mg/dL (ref 6–20)
CALCIUM: 8.8 mg/dL — AB (ref 8.9–10.3)
CO2: 27 mmol/L (ref 22–32)
Chloride: 99 mmol/L — ABNORMAL LOW (ref 101–111)
Creatinine, Ser: 1.03 mg/dL (ref 0.61–1.24)
GFR calc non Af Amer: >60 mL/min (ref >60)
Glucose, Bld: 124 mg/dL — ABNORMAL HIGH (ref 65–99)
Potassium: 4.1 mmol/L (ref 3.5–5.1)
Sodium: 137 mmol/L (ref 135–145)
TOTAL PROTEIN: 6.8 g/dL (ref 6.5–8.1)

## 2015-10-01 LAB — CBC
HCT: 28.6 % — ABNORMAL LOW (ref 39.0–52.0)
HEMOGLOBIN: 8.4 g/dL — AB (ref 13.0–17.0)
MCH: 19.9 pg — ABNORMAL LOW (ref 26.0–34.0)
MCHC: 29.4 g/dL — ABNORMAL LOW (ref 30.0–36.0)
MCV: 67.8 fL — ABNORMAL LOW (ref 78.0–100.0)
Platelets: 137 10*3/uL — ABNORMAL LOW (ref 150–400)
RBC: 4.22 MIL/uL (ref 4.22–5.81)
RDW: 21.3 % — ABNORMAL HIGH (ref 11.5–15.5)
WBC: 2.5 10*3/uL — ABNORMAL LOW (ref 4.0–10.5)

## 2015-10-01 LAB — LACTATE DEHYDROGENASE: LDH: 217 U/L — AB (ref 98–192)

## 2015-10-01 LAB — INFLUENZA PANEL BY PCR (TYPE A & B)
H1N1FLUPCR: NOT DETECTED
Influenza A By PCR: NEGATIVE
Influenza B By PCR: NEGATIVE

## 2015-10-01 LAB — HIV ANTIBODY (ROUTINE TESTING W REFLEX): HIV SCREEN 4TH GENERATION: NONREACTIVE

## 2015-10-01 LAB — MAGNESIUM: MAGNESIUM: 2 mg/dL (ref 1.7–2.4)

## 2015-10-01 MED ORDER — BENZONATATE 100 MG PO CAPS
200.0000 mg | ORAL_CAPSULE | Freq: Three times a day (TID) | ORAL | Status: DC
  Administered 2015-10-01 – 2015-10-05 (×13): 200 mg via ORAL
  Filled 2015-10-01 (×14): qty 2

## 2015-10-01 MED ORDER — PREDNISONE 20 MG PO TABS: 60.0000 mg | ORAL_TABLET | Freq: Every day | ORAL | Status: DC

## 2015-10-01 MED ORDER — LEVOFLOXACIN 750 MG PO TABS: 750.0000 mg | ORAL_TABLET | ORAL | Status: DC

## 2015-10-01 MED ORDER — DILTIAZEM HCL 90 MG PO TABS: 90.0000 mg | ORAL_TABLET | Freq: Every day | ORAL | Status: DC

## 2015-10-01 MED ORDER — METOPROLOL TARTRATE 25 MG PO TABS: 12.5000 mg | ORAL_TABLET | Freq: Two times a day (BID) | ORAL | Status: DC

## 2015-10-01 MED ORDER — WARFARIN SODIUM 5 MG PO TABS
8.0000 mg | ORAL_TABLET | Freq: Once | ORAL | Status: AC
  Administered 2015-10-01: 8 mg via ORAL
  Filled 2015-10-01: qty 1

## 2015-10-01 MED ORDER — DILTIAZEM HCL 60 MG PO TABS: 90.0000 mg | ORAL_TABLET | Freq: Every day | ORAL | Status: DC

## 2015-10-01 MED ORDER — ALBUTEROL SULFATE HFA 108 (90 BASE) MCG/ACT IN AERS: 2.0000 | INHALATION_SPRAY | Freq: Four times a day (QID) | RESPIRATORY_TRACT | Status: DC | PRN

## 2015-10-01 MED ORDER — METOPROLOL TARTRATE 12.5 MG HALF TABLET
12.5000 mg | ORAL_TABLET | Freq: Two times a day (BID) | ORAL | Status: DC
  Administered 2015-10-02 – 2015-10-05 (×8): 12.5 mg via ORAL
  Filled 2015-10-01 (×8): qty 1

## 2015-10-01 NOTE — Progress Notes (Signed)
Steven Barnett [id number] DOB: Apr 28, 1951 DOA: 09/30/2015 PCP: Elisabeth Cara, PA-C  Brief narrative: 65 year old male Chronic diastolic heart failure-EF 55-60% 07/2015 baseline hemoglobin 10-11 range in January 2017 OSA not on CPAP on Revatio ventral hernia History of polysubstance abuse 2008 Hypertension\ history of right-sided thoracotomy for empyema 11/2008-Permanent atrial fibrillation since that time Body mass index is 39.03 kg/(m^2).  mild thrombocytopenia Microcytic anemia  presented to the emergency room 09/30/15 after 3-4 nights of poor sleep, cough without right wrist chills nor fever He denies any sputum, air pain, difficulty swallowing, myalgias. He states that family members in his household have been treated for respiratory illnesses with antibiotics-he is not sure exactly what. He does not have any burning in his urine nor does he have any subjective fever and did not feel the need to measure  Bradycardic and hypotensive 96/50 on admit Wbc 4.6 Hemoglobin 8.1 [baseline 10] MCV 68 PLT 150 Bun/creat 12/1.2 Bili 1.9  CXr 1 view ? Infiltrate    2 breathing Rx in EMS-Sats ? 80's to 94 Given another neb + 40 of lasix  Past medical history-As per Problem list Chart reviewed as below-   Consultants:  none  Procedures:  none  Antibiotics:  None currently   Subjective   Cough improved.  No cp Feels a little better No n/v   Objective    Interim History:   Telemetry: Brady 39-50, 1.9 sec pauses   Objective: Filed Vitals:   10/01/15 1308 10/01/15 1518 10/01/15 1519 10/01/15 1607  BP: 124/69     Pulse: 77     Temp: 98 F (36.7 C)     TempSrc: Oral     Resp: 18     Height:      Weight:      SpO2: 96% 95% 93% 99%    Intake/Output Summary (Last 24 hours) at 10/01/15 1617 Last data filed at 10/01/15 1522  Gross per 24 hour  Intake    483 ml  Output    800 ml  Net   -317 ml    Exam:  General: eomi ncat Cardiovascular: s1  s 2no m/r/g Respiratory: clear with coarse rales bilaterally all ove rlungs Abdomen: soft nt nd no rebound Skin no le edema Neuro intact  Data Reviewed: Basic Metabolic Panel:  Recent Labs Lab 09/30/15 1342 10/01/15 0518  NA 141 137  K 3.6 4.1  CL 100* 99*  CO2 31 27  GLUCOSE 95 124*  BUN 12 15  CREATININE 1.20 1.03  CALCIUM 8.7* 8.8*  MG  --  2.0   Liver Function Tests:  Recent Labs Lab 09/30/15 1342 10/01/15 0518  AST 28 29  ALT 17 19  ALKPHOS 98 98  BILITOT 1.9* 1.8*  PROT 6.8 6.8  ALBUMIN 3.3* 3.2*   No results for input(s): LIPASE, AMYLASE in the last 168 hours. No results for input(s): AMMONIA in the last 168 hours. CBC:  Recent Labs Lab 09/30/15 1342 10/01/15 0518  WBC 4.6 2.5*  HGB 8.1* 8.4*  HCT 29.2* 28.6*  MCV 68.4* 67.8*  PLT 150 137*   Cardiac Enzymes:  Recent Labs Lab 09/30/15 1356  TROPONINI <0.03   BNP: Invalid input(s): POCBNP CBG: No results for input(s): GLUCAP in the last 168 hours.  Recent Results (from the past 240 hour(s))  Culture, blood (routine x 2) Call MD if unable to obtain prior to antibiotics being given     Status: None (Preliminary result)   Collection Time: 09/30/15  7:29 PM  Result Value Ref Range Status   Specimen Description BLOOD LEFT ANTECUBITAL  Final   Special Requests BOTTLES DRAWN AEROBIC AND ANAEROBIC 5CC  Final   Culture NO GROWTH < 24 HOURS  Final   Report Status PENDING  Incomplete  Culture, blood (routine x 2) Call MD if unable to obtain prior to antibiotics being given     Status: None (Preliminary result)   Collection Time: 09/30/15  7:30 PM  Result Value Ref Range Status   Specimen Description BLOOD RIGHT ANTECUBITAL  Final   Special Requests BOTTLES DRAWN AEROBIC ONLY 5CC  Final   Culture NO GROWTH < 24 HOURS  Final   Report Status PENDING  Incomplete     Studies:              All Imaging reviewed and is as per above notation   Scheduled Meds: . albuterol  2.5 mg Nebulization QID  .  benzonatate  200 mg Oral TID  . [START ON 10/02/2015] diltiazem  90 mg Oral Daily  . furosemide  80 mg Intravenous BID  . hydrALAZINE  50 mg Oral BID  . levofloxacin  750 mg Oral Q24H  . lisinopril  20 mg Oral Daily  . metoprolol tartrate  12.5 mg Oral BID  . potassium chloride  20 mEq Oral Daily  . predniSONE  60 mg Oral QAC breakfast  . sodium chloride flush  3 mL Intravenous Q12H  . warfarin  8 mg Oral ONCE-1800  . Warfarin - Pharmacist Dosing Inpatient   Does not apply q1800   Continuous Infusions:    Assessment/Plan:  Multifactorial acute hypoxic respiratory failure dyspnea-patient has infiltrate on chest x-ray  2 view,  Pneumonia-start community-acquired treatment with Levaquin by mouth 750 daily for 5 days. improved  ? Possible underlying COPD-chest x-ray reviewed by me not very impressive for hyperinflation-give nebulizations with albuterol every 4 scheduled and start prednisone 60 and monitor for effect--burst of steroids for 5 days on d/c home  Mild decompensated diastolic heart failure EF 55-60 07/2015-pro BNP is 599 but this is not sensitive in an obese patient patient HOwever-has JVD and elevated bilirubin concerning for central venous congestion of the liver-diuresis with Lasix 80 mg IV twice a day-->home dose lasix 80 po,  metoprolol from 50 twice a day to 25 twice a day to allow for adequate diuresis  Obstructive sleep apnea not on BiPAP or CPAP-case manager to arrange auto Pap  Restrictive lung disease component given habitus-needs weight loss Body mass index is 39.03 kg/(m^2).   History of congestive heart failure-continue above medications in addition to hydralazine 50 twice a day, monitor daily weeks' strict ins and outs Place Foley I/o inaccurate  Permanent atrial fibrillation-continue above medications with changes, continue home dosage of Coumadin--PharmD to assist with dosing  Probable cirrhosis/NASH versus central venous congestion-check a.m.  LFTs-stable  History hypertension, continue lisinopril 20 daily  Prior history CAD-continue above meds, continue aspirin 81 daily  Anemia, severely microcytic-agree with Hemoccult stool, continue aspirin, possible component of decompensated heart failure from this?  Verneita Griffes, MD  [facility] Pager [phone] 10/01/2015, 4:17 PM    LOS: 1 day

## 2015-10-01 NOTE — Progress Notes (Signed)
ANTICOAGULATION CONSULT NOTE  Pharmacy Consult:  Coumadin Indication: atrial fibrillation  No Known Allergies  Labs:  Recent Labs  09/29/15 0943 09/30/15 1342 09/30/15 1356 10/01/15 0518  HGB  --  8.1*  --  8.4*  HCT  --  29.2*  --  28.6*  PLT  --  150  --  137*  LABPROT  --  22.9*  --  25.7*  INR 1.9 2.04*  --  2.37*  CREATININE  --  1.20  --  1.03  TROPONINI  --   --  <0.03  --     Estimated Creatinine Clearance: 96.5 mL/min (by C-G formula based on Cr of 1.03).   Assessment: 80 YOM to continue on Coumadin from PTA for history of AFib.  INR is therapeutic on admission and patient states he hasn't taken today's dose.  No bleeding reported.  INR therapeutic today  Goal of Therapy:  INR 2-3    Plan:  - Coumadin 8mg  PO today - Daily PT / INR - Watch for DDI with Levaquin  Thank you Anette Guarneri, PharmD [phone] 10/01/2015, 10:21 AM

## 2015-10-01 NOTE — Discharge Instructions (Signed)

## 2015-10-01 NOTE — Progress Notes (Signed)
Patient refused bed alarm for safety. Patient stated he will call for assistance. Charge notified.

## 2015-10-02 LAB — COMPREHENSIVE METABOLIC PANEL
ALBUMIN: 3.2 g/dL — AB (ref 3.5–5.0)
ALK PHOS: 103 U/L (ref 38–126)
ALT: 18 U/L (ref 17–63)
AST: 26 U/L (ref 15–41)
Anion gap: 7 (ref 5–15)
BILIRUBIN TOTAL: 1.4 mg/dL — AB (ref 0.3–1.2)
BUN: 19 mg/dL (ref 6–20)
CALCIUM: 8.4 mg/dL — AB (ref 8.9–10.3)
CO2: 30 mmol/L (ref 22–32)
CREATININE: 1.06 mg/dL (ref 0.61–1.24)
Chloride: 99 mmol/L — ABNORMAL LOW (ref 101–111)
GFR calc Af Amer: >60 mL/min (ref >60)
GLUCOSE: 85 mg/dL (ref 65–99)
Potassium: 4 mmol/L (ref 3.5–5.1)
Sodium: 136 mmol/L (ref 135–145)
TOTAL PROTEIN: 6.6 g/dL (ref 6.5–8.1)

## 2015-10-02 LAB — CBC WITH DIFFERENTIAL/PLATELET
BASOS ABS: 0 10*3/uL (ref 0.0–0.1)
BASOS PCT: 0 %
EOS ABS: 0 10*3/uL (ref 0.0–0.7)
Eosinophils Relative: 0 %
HCT: 28.1 % — ABNORMAL LOW (ref 39.0–52.0)
Hemoglobin: 8.1 g/dL — ABNORMAL LOW (ref 13.0–17.0)
LYMPHS PCT: 11 %
Lymphs Abs: 1.1 10*3/uL (ref 0.7–4.0)
MCH: 19.8 pg — AB (ref 26.0–34.0)
MCHC: 28.8 g/dL — ABNORMAL LOW (ref 30.0–36.0)
MCV: 68.7 fL — AB (ref 78.0–100.0)
MONO ABS: 0.7 10*3/uL (ref 0.1–1.0)
Monocytes Relative: 7 %
NEUTROS PCT: 82 %
Neutro Abs: 8 10*3/uL — ABNORMAL HIGH (ref 1.7–7.7)
PLATELETS: 125 10*3/uL — AB (ref 150–400)
RBC: 4.09 MIL/uL — ABNORMAL LOW (ref 4.22–5.81)
RDW: 21.4 % — AB (ref 11.5–15.5)
WBC: 9.8 10*3/uL (ref 4.0–10.5)

## 2015-10-02 LAB — PROTIME-INR
INR: 2.76 — AB (ref 0.00–1.49)
PROTHROMBIN TIME: 28.8 s — AB (ref 11.6–15.2)

## 2015-10-02 LAB — HAPTOGLOBIN: HAPTOGLOBIN: 120 mg/dL (ref 34–200)

## 2015-10-02 LAB — STREP PNEUMONIAE URINARY ANTIGEN: Strep Pneumo Urinary Antigen: NEGATIVE

## 2015-10-02 MED ORDER — WARFARIN SODIUM 3 MG PO TABS
6.0000 mg | ORAL_TABLET | Freq: Once | ORAL | Status: AC
  Administered 2015-10-02: 6 mg via ORAL
  Filled 2015-10-02: qty 2

## 2015-10-02 MED ORDER — ALBUTEROL SULFATE (2.5 MG/3ML) 0.083% IN NEBU
2.5000 mg | INHALATION_SOLUTION | RESPIRATORY_TRACT | Status: DC | PRN
Start: 2015-10-02 — End: 2015-10-05
  Administered 2015-10-04: 2.5 mg via RESPIRATORY_TRACT

## 2015-10-02 NOTE — Progress Notes (Signed)
ANTICOAGULATION CONSULT NOTE  Pharmacy Consult:  Coumadin Indication: atrial fibrillation  No Known Allergies  Labs:  Recent Labs  09/30/15 1342 09/30/15 1356 10/01/15 0518 10/02/15 0459 10/02/15 0758  HGB 8.1*  --  8.4*  --   --   HCT 29.2*  --  28.6*  --   --   PLT 150  --  137*  --   --   LABPROT 22.9*  --  25.7* 28.8*  --   INR 2.04*  --  2.37* 2.76*  --   CREATININE 1.20  --  1.03  --  1.06  TROPONINI  --  <0.03  --   --   --     Estimated Creatinine Clearance: 93.4 mL/min (by C-G formula based on Cr of 1.06).   Assessment: 48 YOM to continue on Coumadin from PTA for history of AFib.  INR is therapeutic on admission and patient states he hasn't taken today's dose.  No bleeding reported.  INR therapeutic today, but trending up (with Levaquin interaction)  Goal of Therapy:  INR 2-3    Plan:  - Coumadin 6 mg PO today - Daily PT / INR - Watch for DDI with Levaquin  Thank you Anette Guarneri, PharmD [phone] 10/02/2015, 9:49 AM

## 2015-10-02 NOTE — Progress Notes (Signed)
Steven Barnett [id number] DOB: Mar 08, 1951 DOA: 09/30/2015 PCP: Elisabeth Cara, PA-C  Brief narrative: 65 year old male Chronic diastolic heart failure-EF 55-60% 07/2015 baseline hemoglobin 10-11 range in January 2017 OSA not on CPAP on Revatio ventral hernia History of polysubstance abuse 2008 Hypertension\ history of right-sided thoracotomy for empyema 11/2008-Permanent atrial fibrillation since that time Body mass index is 39.03 kg/(m^2).  mild thrombocytopenia Microcytic anemia  presented to the emergency room 09/30/15 after 3-4 nights of poor sleep, cough without right wrist chills nor fever He denies any sputum, air pain, difficulty swallowing, myalgias. He states that family members in his household have been treated for respiratory illnesses with antibiotics-he is not sure exactly what. He does not have any burning in his urine nor does he have any subjective fever and did not feel the need to measure  Bradycardic and hypotensive 96/50 on admit Wbc 4.6 Hemoglobin 8.1 [baseline 10] MCV 68 PLT 150 Bun/creat 12/1.2 Bili 1.9  CXr 1 view ? Infiltrate    2 breathing Rx in EMS-Sats ? 80's to 94 Given another neb + 40 of lasix  Past medical history-As per Problem list Chart reviewed as below-   Consultants:  none  Procedures:  none  Antibiotics:  None currently   Subjective   Cough improved.  No cp Feels a little better No n/v   Objective    Interim History:   Telemetry: Brady 39-50, 1.9 sec pauses   Objective: Filed Vitals:   10/02/15 0950 10/02/15 0959 10/02/15 1002 10/02/15 1351  BP:  144/88 141/82   Pulse:  67 72   Temp:  97.5 F (36.4 C)    TempSrc:      Resp:  18    Height:      Weight:      SpO2: 89% 95%  95%    Intake/Output Summary (Last 24 hours) at 10/02/15 1430 Last data filed at 10/02/15 1330  Gross per 24 hour  Intake    820 ml  Output   6500 ml  Net  -5680 ml    Exam:  General: eomi ncat Cardiovascular:  s1 s 2no m/r/g Respiratory: clear with coarse rales bilaterally all ove rlungs Abdomen: soft nt nd no rebound Skin no le edema Neuro intact  Data Reviewed: Basic Metabolic Panel:  Recent Labs Lab 09/30/15 1342 10/01/15 0518 10/02/15 0758  NA 141 137 136  K 3.6 4.1 4.0  CL 100* 99* 99*  CO2 31 27 30   GLUCOSE 95 124* 85  BUN 12 15 19   CREATININE 1.20 1.03 1.06  CALCIUM 8.7* 8.8* 8.4*  MG  --  2.0  --    Liver Function Tests:  Recent Labs Lab 09/30/15 1342 10/01/15 0518 10/02/15 0758  AST 28 29 26   ALT 17 19 18   ALKPHOS 98 98 103  BILITOT 1.9* 1.8* 1.4*  PROT 6.8 6.8 6.6  ALBUMIN 3.3* 3.2* 3.2*   No results for input(s): LIPASE, AMYLASE in the last 168 hours. No results for input(s): AMMONIA in the last 168 hours. CBC:  Recent Labs Lab 09/30/15 1342 10/01/15 0518 10/02/15 0758  WBC 4.6 2.5* 9.8  NEUTROABS  --   --  8.0*  HGB 8.1* 8.4* 8.1*  HCT 29.2* 28.6* 28.1*  MCV 68.4* 67.8* 68.7*  PLT 150 137* 125*   Cardiac Enzymes:  Recent Labs Lab 09/30/15 1356  TROPONINI <0.03   BNP: Invalid input(s): POCBNP CBG: No results for input(s): GLUCAP in the last 168 hours.  Recent Results (from the past  240 hour(s))  Culture, blood (routine x 2) Call MD if unable to obtain prior to antibiotics being given     Status: None (Preliminary result)   Collection Time: 09/30/15  7:29 PM  Result Value Ref Range Status   Specimen Description BLOOD LEFT ANTECUBITAL  Final   Special Requests BOTTLES DRAWN AEROBIC AND ANAEROBIC 5CC  Final   Culture NO GROWTH 2 DAYS  Final   Report Status PENDING  Incomplete  Culture, blood (routine x 2) Call MD if unable to obtain prior to antibiotics being given     Status: None (Preliminary result)   Collection Time: 09/30/15  7:30 PM  Result Value Ref Range Status   Specimen Description BLOOD RIGHT ANTECUBITAL  Final   Special Requests BOTTLES DRAWN AEROBIC ONLY 5CC  Final   Culture NO GROWTH 2 DAYS  Final   Report Status PENDING   Incomplete     Studies:              All Imaging reviewed and is as per above notation   Scheduled Meds: . albuterol  2.5 mg Nebulization QID  . benzonatate  200 mg Oral TID  . furosemide  80 mg Intravenous BID  . hydrALAZINE  50 mg Oral BID  . levofloxacin  750 mg Oral Q24H  . lisinopril  20 mg Oral Daily  . metoprolol tartrate  12.5 mg Oral BID  . potassium chloride  20 mEq Oral Daily  . predniSONE  60 mg Oral QAC breakfast  . sodium chloride flush  3 mL Intravenous Q12H  . warfarin  6 mg Oral ONCE-1800  . Warfarin - Pharmacist Dosing Inpatient   Does not apply q1800   Continuous Infusions:    Assessment/Plan:  Multifactorial acute hypoxic respiratory failure dyspnea-patient has infiltrate on chest x-ray  2 view,  Pneumonia-start community-acquired treatment with Levaquin by mouth 750 daily for 5 days. improved  ? Possible underlying COPD-chest x-ray reviewed by me not very impressive for hyperinflation-give nebulizations with albuterol every 4 scheduled and start prednisone 60 and monitor for effect--burst of steroids for 5 days  Mild decompensated diastolic heart failure EF 55-60 07/2015-pro BNP is 599 but this is not sensitive in an obese patient patient HOwever-has JVD and elevated bilirubin concerning for central venous congestion of the liver-diuresis with Lasix 80 mg IV twice a day-->home dose lasix 80 po,  metoprolol from 50 twice a day to 25 twice a day to allow for adequate diuresis  Obstructive sleep apnea not on BiPAP or Patient is unclear as to whether he had a sleep study in the past. I will put in the name of a pulmonologist follow him up as an outpatient. He tells me that he tried to do the sleep study but could not sleep with the mask on his face. We will arrange a home nebulizer for him  Restrictive lung disease component given habitus-needs weight loss Body mass index is 39.03 kg/(m^2).   History of congestive heart failure-continue above medications in  addition to hydralazine 50 twice a day, monitor daily weeks' strict ins and outs Place Foley I/o --6 L since admission  weight down 122-->119 kg   Permanent atrial fibrillation-continue above medications with changes, continue home dosage of Coumadin--PharmD to assist with dosing  Probable cirrhosis/NASH versus central venous congestion- Tells me that he has some occasional rectal bleeding has never had a colonoscopy and will need this organized as an outpatient would also suggest potentially a ultrasound of the right upper quadrant  given his low platelet count and hemolytic pattern of his anemia   Severely microcytic anemia with slightly elevated bilirubin  He has occasional dark stool He is on warfarin for A. Fib he will need a colonoscopy as an outpatient  I will obtain hemoglobin electrophoresis to rule out congenital hemolytic take anemia   History hypertension, continue lisinopril 20 daily  Prior history CAD-continue above meds, continue aspirin 81 daily   Home 2-3 days once better overall Expect d/c 10/04/15 Full code OOB to chair Ambulate q4  Verneita Griffes, MD  [facility] Pager [phone] 10/02/2015, 2:30 PM    LOS: 2 days

## 2015-10-02 NOTE — Progress Notes (Signed)
SATURATION QUALIFICATIONS: (This note is used to comply with regulatory documentation for home oxygen)  Patient Saturations on Room Air at Rest = 92%  Patient Saturations on Room Air while Ambulating = 82%  Patient Saturations on 2 Liters of oxygen while Ambulating =92%  Please briefly explain why patient needs home oxygen:Patient 02 saturation dropped with ambulation on room air

## 2015-10-02 NOTE — Progress Notes (Signed)
Assumed care of [name], room [phone].  Patient resting in bed.  Denies pain.  Introduced myself and wrote name and my phone number on his white board and gave instructions on how to call if he needs anything.  He is on telemetry box # 27.

## 2015-10-03 LAB — COMPREHENSIVE METABOLIC PANEL
ALBUMIN: 3 g/dL — AB (ref 3.5–5.0)
ALK PHOS: 108 U/L (ref 38–126)
ALT: 23 U/L (ref 17–63)
ANION GAP: 5 (ref 5–15)
AST: 30 U/L (ref 15–41)
BILIRUBIN TOTAL: 1.3 mg/dL — AB (ref 0.3–1.2)
BUN: 15 mg/dL (ref 6–20)
CALCIUM: 8.3 mg/dL — AB (ref 8.9–10.3)
CO2: 32 mmol/L (ref 22–32)
Chloride: 100 mmol/L — ABNORMAL LOW (ref 101–111)
Creatinine, Ser: 0.95 mg/dL (ref 0.61–1.24)
GFR calc Af Amer: >60 mL/min (ref >60)
GFR calc non Af Amer: >60 mL/min (ref >60)
GLUCOSE: 87 mg/dL (ref 65–99)
Potassium: 3.8 mmol/L (ref 3.5–5.1)
SODIUM: 137 mmol/L (ref 135–145)
TOTAL PROTEIN: 6.8 g/dL (ref 6.5–8.1)

## 2015-10-03 LAB — PROTIME-INR
INR: 2.98 — AB (ref 0.00–1.49)
Prothrombin Time: 30.4 seconds — ABNORMAL HIGH (ref 11.6–15.2)

## 2015-10-03 LAB — CBC WITH DIFFERENTIAL/PLATELET
BASOS ABS: 0 10*3/uL (ref 0.0–0.1)
Basophils Relative: 0 %
EOS ABS: 0 10*3/uL (ref 0.0–0.7)
Eosinophils Relative: 0 %
HCT: 30.2 % — ABNORMAL LOW (ref 39.0–52.0)
Hemoglobin: 8.5 g/dL — ABNORMAL LOW (ref 13.0–17.0)
LYMPHS ABS: 1.2 10*3/uL (ref 0.7–4.0)
LYMPHS PCT: 12 %
MCH: 19.2 pg — ABNORMAL LOW (ref 26.0–34.0)
MCHC: 28.1 g/dL — AB (ref 30.0–36.0)
MCV: 68.2 fL — ABNORMAL LOW (ref 78.0–100.0)
MONO ABS: 1.1 10*3/uL — AB (ref 0.1–1.0)
Monocytes Relative: 11 %
NEUTROS ABS: 7.6 10*3/uL (ref 1.7–7.7)
Neutrophils Relative %: 77 %
PLATELETS: 121 10*3/uL — AB (ref 150–400)
RBC: 4.43 MIL/uL (ref 4.22–5.81)
RDW: 21.3 % — AB (ref 11.5–15.5)
WBC: 9.9 10*3/uL (ref 4.0–10.5)

## 2015-10-03 LAB — BASIC METABOLIC PANEL
Anion gap: 8 (ref 5–15)
BUN: 15 mg/dL (ref 6–20)
CALCIUM: 8.7 mg/dL — AB (ref 8.9–10.3)
CO2: 29 mmol/L (ref 22–32)
CREATININE: 1.06 mg/dL (ref 0.61–1.24)
Chloride: 99 mmol/L — ABNORMAL LOW (ref 101–111)
GFR calc non Af Amer: >60 mL/min (ref >60)
Glucose, Bld: 193 mg/dL — ABNORMAL HIGH (ref 65–99)
Potassium: 4.1 mmol/L (ref 3.5–5.1)
SODIUM: 136 mmol/L (ref 135–145)

## 2015-10-03 MED ORDER — POLYETHYLENE GLYCOL 3350 17 G PO PACK
17.0000 g | PACK | Freq: Every day | ORAL | Status: DC
  Administered 2015-10-03 – 2015-10-05 (×3): 17 g via ORAL
  Filled 2015-10-03 (×3): qty 1

## 2015-10-03 MED ORDER — PREDNISONE 20 MG PO TABS
20.0000 mg | ORAL_TABLET | Freq: Every day | ORAL | Status: AC
  Administered 2015-10-04 – 2015-10-05 (×2): 20 mg via ORAL
  Filled 2015-10-03 (×2): qty 1

## 2015-10-03 MED ORDER — SENNOSIDES-DOCUSATE SODIUM 8.6-50 MG PO TABS
1.0000 | ORAL_TABLET | Freq: Two times a day (BID) | ORAL | Status: DC
  Administered 2015-10-03 – 2015-10-05 (×4): 1 via ORAL
  Filled 2015-10-03 (×4): qty 1

## 2015-10-03 MED ORDER — WARFARIN SODIUM 3 MG PO TABS
3.0000 mg | ORAL_TABLET | Freq: Once | ORAL | Status: AC
  Administered 2015-10-03: 3 mg via ORAL
  Filled 2015-10-03: qty 1

## 2015-10-03 NOTE — Progress Notes (Signed)
SATURATION QUALIFICATIONS: (This note is used to comply with regulatory documentation for home oxygen)  Patient Saturations on Room Air at Rest = 96%  Patient Saturations on Room Air while Ambulating = 96%  Patient Saturations on 0 Liters of oxygen while Ambulating = 96%  Please briefly explain why patient needs home oxygen: 

## 2015-10-03 NOTE — Progress Notes (Signed)
ANTICOAGULATION CONSULT NOTE  Pharmacy Consult:  Coumadin Indication: atrial fibrillation  No Known Allergies  Labs:  Recent Labs  09/30/15 1356 10/01/15 0518 10/02/15 0459 10/02/15 0758 10/03/15 0400  HGB  --  8.4*  --  8.1* 8.5*  HCT  --  28.6*  --  28.1* 30.2*  PLT  --  137*  --  125* 121*  LABPROT  --  25.7* 28.8*  --  30.4*  INR  --  2.37* 2.76*  --  2.98*  CREATININE  --  1.03  --  1.06 0.95  TROPONINI <0.03  --   --   --   --     Estimated Creatinine Clearance: 102.2 mL/min (by C-G formula based on Cr of 0.95).   Assessment: 50 YOM to continue on Coumadin from PTA for history of AFib.  INR is therapeutic on admission and patient states he hasn't taken today's dose.  No bleeding reported.  INR therapeutic today, but has been trending up quickly (with Levaquin interaction)  Goal of Therapy:  INR 2-3    Plan:  - Coumadin 3 mg PO today - Daily PT / INR - Watch for DDI with Levaquin  Joya San, PharmD Clinical Pharmacy Resident Pager # [phone] 10/03/2015 12:44 PM

## 2015-10-04 LAB — CBC WITH DIFFERENTIAL/PLATELET
BASOS PCT: 0 %
Basophils Absolute: 0 10*3/uL (ref 0.0–0.1)
EOS ABS: 0 10*3/uL (ref 0.0–0.7)
EOS PCT: 0 %
HCT: 32.6 % — ABNORMAL LOW (ref 39.0–52.0)
Hemoglobin: 9.2 g/dL — ABNORMAL LOW (ref 13.0–17.0)
LYMPHS ABS: 2.1 10*3/uL (ref 0.7–4.0)
Lymphocytes Relative: 23 %
MCH: 19.2 pg — AB (ref 26.0–34.0)
MCHC: 28.2 g/dL — AB (ref 30.0–36.0)
MCV: 67.9 fL — AB (ref 78.0–100.0)
MONO ABS: 0.6 10*3/uL (ref 0.1–1.0)
Monocytes Relative: 6 %
NEUTROS ABS: 6.5 10*3/uL (ref 1.7–7.7)
Neutrophils Relative %: 71 %
PLATELETS: 139 10*3/uL — AB (ref 150–400)
RBC: 4.8 MIL/uL (ref 4.22–5.81)
RDW: 21.2 % — ABNORMAL HIGH (ref 11.5–15.5)
WBC: 9.2 10*3/uL (ref 4.0–10.5)

## 2015-10-04 LAB — BASIC METABOLIC PANEL
Anion gap: 8 (ref 5–15)
BUN: 15 mg/dL (ref 6–20)
CALCIUM: 8.5 mg/dL — AB (ref 8.9–10.3)
CHLORIDE: 99 mmol/L — AB (ref 101–111)
CO2: 30 mmol/L (ref 22–32)
CREATININE: 1.04 mg/dL (ref 0.61–1.24)
GFR calc non Af Amer: >60 mL/min (ref >60)
GLUCOSE: 134 mg/dL — AB (ref 65–99)
Potassium: 3 mmol/L — ABNORMAL LOW (ref 3.5–5.1)
Sodium: 137 mmol/L (ref 135–145)

## 2015-10-04 LAB — PROTIME-INR
INR: 2.55 — ABNORMAL HIGH (ref 0.00–1.49)
PROTHROMBIN TIME: 27.1 s — AB (ref 11.6–15.2)

## 2015-10-04 MED ORDER — WARFARIN SODIUM 3 MG PO TABS
6.0000 mg | ORAL_TABLET | Freq: Once | ORAL | Status: AC
Start: 2015-10-04 — End: 2015-10-04
  Administered 2015-10-04: 6 mg via ORAL
  Filled 2015-10-04: qty 2

## 2015-10-04 MED ORDER — FUROSEMIDE 80 MG PO TABS
80.0000 mg | ORAL_TABLET | Freq: Two times a day (BID) | ORAL | Status: DC
  Administered 2015-10-04 – 2015-10-05 (×2): 80 mg via ORAL
  Filled 2015-10-04 (×2): qty 1

## 2015-10-04 MED ORDER — POTASSIUM CHLORIDE CRYS ER 20 MEQ PO TBCR
40.0000 meq | EXTENDED_RELEASE_TABLET | Freq: Every day | ORAL | Status: DC
  Administered 2015-10-04 – 2015-10-05 (×2): 40 meq via ORAL
  Filled 2015-10-04 (×2): qty 2

## 2015-10-04 NOTE — Progress Notes (Signed)
Steven Barnett [id number] DOB: March 10, 1951 DOA: 09/30/2015 PCP: Elisabeth Cara, PA-C  Brief narrative: 65 year old male Chronic diastolic heart failure-EF 55-60% 07/2015 baseline hemoglobin 10-11 range in January 2017 OSA not on CPAP on Revatio ventral hernia History of polysubstance abuse 2008 Hypertension\ history of right-sided thoracotomy for empyema 11/2008-Permanent atrial fibrillation since that time Body mass index is 39.03 kg/(m^2).  mild thrombocytopenia Microcytic anemia  presented to the emergency room 09/30/15 after 3-4 nights of poor sleep, cough without right wrist chills nor fever He denies any sputum, air pain, difficulty swallowing, myalgias. He states that family members in his household have been treated for respiratory illnesses with antibiotics-he is not sure exactly what. He does not have any burning in his urine nor does he have any subjective fever and did not feel the need to measure  Bradycardic and hypotensive 96/50 on admit Wbc 4.6 Hemoglobin 8.1 [baseline 10] MCV 68 PLT 150 Bun/creat 12/1.2 Bili 1.9  CXr 1 view ? Infiltrate    2 breathing Rx in EMS-Sats ? 80's to 94 Given another neb + 40 of lasix  Past medical history-As per Problem list Chart reviewed as below-   Consultants:  none  Procedures:  none  Antibiotics:  None currently   Subjective   Well still sob No n/v/cp Eating and drinking ambulant   Objective    Interim History:   Telemetry: Brady 39-50, 1.9 sec pauses   Objective: Filed Vitals:   10/03/15 1702 10/03/15 1940 10/03/15 2103 10/04/15 0511  BP:  133/71  126/77  Pulse:  112  90  Temp:  98 F (36.7 C)  98.3 F (36.8 C)  TempSrc:  Oral  Oral  Resp:  18  18  Height:      Weight:    116.166 kg (256 lb 1.6 oz)  SpO2: 93% 97% 99% 96%    Intake/Output Summary (Last 24 hours) at 10/04/15 0727 Last data filed at 10/04/15 0534  Gross per 24 hour  Intake   1920 ml  Output   4950 ml  Net   -3030 ml    Exam:  General: eomi ncat Cardiovascular: s1 s 2no m/r/g Respiratory: clear with coarse rales bilaterally all ove rlungs Abdomen: soft nt nd no rebound Skin no le edema Neuro intact  Data Reviewed: Basic Metabolic Panel:  Recent Labs Lab 09/30/15 1342 10/01/15 0518 10/02/15 0758 10/03/15 0400 10/03/15 1811  NA 141 137 136 137 136  K 3.6 4.1 4.0 3.8 4.1  CL 100* 99* 99* 100* 99*  CO2 31 27 30  32 29  GLUCOSE 95 124* 85 87 193*  BUN 12 15 19 15 15   CREATININE 1.20 1.03 1.06 0.95 1.06  CALCIUM 8.7* 8.8* 8.4* 8.3* 8.7*  MG  --  2.0  --   --   --    Liver Function Tests:  Recent Labs Lab 09/30/15 1342 10/01/15 0518 10/02/15 0758 10/03/15 0400  AST 28 29 26 30   ALT 17 19 18 23   ALKPHOS 98 98 103 108  BILITOT 1.9* 1.8* 1.4* 1.3*  PROT 6.8 6.8 6.6 6.8  ALBUMIN 3.3* 3.2* 3.2* 3.0*   No results for input(s): LIPASE, AMYLASE in the last 168 hours. No results for input(s): AMMONIA in the last 168 hours. CBC:  Recent Labs Lab 09/30/15 1342 10/01/15 0518 10/02/15 0758 10/03/15 0400  WBC 4.6 2.5* 9.8 9.9  NEUTROABS  --   --  8.0* 7.6  HGB 8.1* 8.4* 8.1* 8.5*  HCT 29.2* 28.6* 28.1* 30.2*  MCV 68.4* 67.8* 68.7* 68.2*  PLT 150 137* 125* 121*   Cardiac Enzymes:  Recent Labs Lab 09/30/15 1356  TROPONINI <0.03   BNP: Invalid input(s): POCBNP CBG: No results for input(s): GLUCAP in the last 168 hours.  Recent Results (from the past 240 hour(s))  Culture, blood (routine x 2) Call MD if unable to obtain prior to antibiotics being given     Status: None (Preliminary result)   Collection Time: 09/30/15  7:29 PM  Result Value Ref Range Status   Specimen Description BLOOD LEFT ANTECUBITAL  Final   Special Requests BOTTLES DRAWN AEROBIC AND ANAEROBIC 5CC  Final   Culture NO GROWTH 3 DAYS  Final   Report Status PENDING  Incomplete  Culture, blood (routine x 2) Call MD if unable to obtain prior to antibiotics being given     Status: None (Preliminary  result)   Collection Time: 09/30/15  7:30 PM  Result Value Ref Range Status   Specimen Description BLOOD RIGHT ANTECUBITAL  Final   Special Requests BOTTLES DRAWN AEROBIC ONLY 5CC  Final   Culture NO GROWTH 3 DAYS  Final   Report Status PENDING  Incomplete     Studies:              All Imaging reviewed and is as per above notation   Scheduled Meds: . albuterol  2.5 mg Nebulization QID  . benzonatate  200 mg Oral TID  . furosemide  80 mg Intravenous BID  . hydrALAZINE  50 mg Oral BID  . levofloxacin  750 mg Oral Q24H  . lisinopril  20 mg Oral Daily  . metoprolol tartrate  12.5 mg Oral BID  . polyethylene glycol  17 g Oral Daily  . potassium chloride  20 mEq Oral Daily  . predniSONE  20 mg Oral QAC breakfast  . senna-docusate  1 tablet Oral BID  . sodium chloride flush  3 mL Intravenous Q12H  . Warfarin - Pharmacist Dosing Inpatient   Does not apply q1800   Continuous Infusions:    Assessment/Plan:  Multifactorial acute hypoxic respiratory failure dyspnea-patient has infiltrate on chest x-ray  2 view,  Pneumonia-start community-acquired treatment with Levaquin by mouth 750 daily for 5 days. improved  ? Possible underlying COPD-chest x-ray reviewed by me not very impressive for hyperinflation-give nebulizations with albuterol every 4 scheduled and start prednisone 60 and monitor for effect--burst of steroids for 5 days  Mild decompensated diastolic heart failure EF 55-60 07/2015-pro BNP is 599 but this is not sensitive in an obese patient patient However-has JVD and elevated bilirubin concerning for central venous congestion of the liver-diuresis with Lasix 80 mg IV twice a day-->home dose lasix 80 po,  metoprolol from 50 twice a day to 25 twice a day to allow for adequate diuresis  Obstructive sleep apnea not on BiPAP or Patient is unclear as to whether he had a sleep study in the past. I will put in the name of a pulmonologist follow him up as an outpatient. He tells me that he  tried to do the sleep study but could not sleep with the mask on his face. We will arrange a home nebulizer for him  Restrictive lung disease component given habitus-needs weight loss Body mass index is 39.03 kg/(m^2).   History of congestive heart failure-continue above medications in addition to hydralazine 50 twice a day, monitor daily weeks' strict ins and outs Place Foley I/o -10 L since admission  weight down 122-->119-->116 kg  Permanent atrial fibrillation-continue above medications with changes, continue home dosage of Coumadin--PharmD to assist with dosing  Probable cirrhosis/NASH versus central venous congestion- Tells me that he has some occasional rectal bleeding has never had a colonoscopy and will need this organized as an outpatient would also suggest potentially a ultrasound of the right upper quadrant given his low platelet count and hemolytic pattern of his anemia   Severely microcytic anemia with slightly elevated bilirubin  He has occasional dark stool He is on warfarin for A. Fib he will need a colonoscopy as an outpatient Await hemoglobin electrophoresis to rule out congenital hemolytic take anemia   History hypertension, continue lisinopril 20 daily  Prior history CAD-continue above meds, continue aspirin 81 daily   Home 2-3 days once better overall Expect d/c 10/04/15??  Verneita Griffes, MD  [facility] Pager [phone] 10/04/2015, 7:27 AM    LOS: 4 days

## 2015-10-04 NOTE — Progress Notes (Signed)
ANTICOAGULATION CONSULT NOTE  Pharmacy Consult:  Coumadin Indication: atrial fibrillation  No Known Allergies  Labs:  Recent Labs  10/02/15 0459  10/02/15 0758 10/03/15 0400 10/03/15 1811 10/04/15 0423 10/04/15 1003  HGB  --   < > 8.1* 8.5*  --   --  9.2*  HCT  --   --  28.1* 30.2*  --   --  32.6*  PLT  --   --  125* 121*  --   --  139*  LABPROT 28.8*  --   --  30.4*  --  27.1*  --   INR 2.76*  --   --  2.98*  --  2.55*  --   CREATININE  --   < > 1.06 0.95 1.06  --  1.04  < > = values in this interval not displayed.  Estimated Creatinine Clearance: 92.9 mL/min (by C-G formula based on Cr of 1.04).   Assessment: 62 YOM to continue on Coumadin from PTA for history of AFib.  INR is therapeutic on admission.  No bleeding reported.  INR therapeutic today.  Goal of Therapy:  INR 2-3    Plan:  - Coumadin 6 mg PO today - Daily PT / INR - Watch for DDI with Levaquin  Joya San, PharmD Clinical Pharmacy Resident Pager # [phone] 10/04/2015 12:53 PM

## 2015-10-04 NOTE — Progress Notes (Signed)
Steven Barnett [id number] DOB: 1951-03-18 DOA: 09/30/2015 PCP: Elisabeth Cara, PA-C  Brief narrative: 65 year old male Chronic diastolic heart failure-EF 55-60% 07/2015 baseline hemoglobin 10-11 range in January 2017 OSA not on CPAP on Revatio ventral hernia History of polysubstance abuse 2008 Hypertension\ history of right-sided thoracotomy for empyema 11/2008-Permanent atrial fibrillation since that time Body mass index is 39.03 kg/(m^2).  mild thrombocytopenia Microcytic anemia  presented to the emergency room 09/30/15 after 3-4 nights of poor sleep, cough without right wrist chills nor fever He denies any sputum, air pain, difficulty swallowing, myalgias. He states that family members in his household have been treated for respiratory illnesses with antibiotics-he is not sure exactly what. He does not have any burning in his urine nor does he have any subjective fever and did not feel the need to measure  Bradycardic and hypotensive 96/50 on admit Wbc 4.6 Hemoglobin 8.1 [baseline 10] MCV 68 PLT 150 Bun/creat 12/1.2 Bili 1.9  CXr 1 view ? Infiltrate    2 breathing Rx in EMS-Sats ? 80's to 94 Given another neb + 40 of lasix  Past medical history-As per Problem list Chart reviewed as below-   Consultants:  none  Procedures:  none  Antibiotics:  None currently   Subjective   Looks well feels well no nausea no vomiting Was sleepy at the bedside earlier No chest pain No shortness of breath No fever no chills    Objective    Interim History:   Telemetry: Brady 39-50, 1.9 sec pauses   Objective: Filed Vitals:   10/03/15 2103 10/04/15 0511 10/04/15 0931 10/04/15 0955  BP:  126/77 113/58   Pulse:  90    Temp:  98.3 F (36.8 C)    TempSrc:  Oral    Resp:  18    Height:      Weight:  116.166 kg (256 lb 1.6 oz)    SpO2: 99% 96%  92%    Intake/Output Summary (Last 24 hours) at 10/04/15 1226 Last data filed at 10/04/15 1000  Gross  per 24 hour  Intake   1680 ml  Output   4050 ml  Net  -2370 ml    Exam:  General: eomi ncat Cardiovascular: s1 s 2no m/r/g Respiratory: clear with coarse rales bilaterally all over lungs Abdomen: soft nt nd no rebound Skin no le edema Neuro intact  Data Reviewed: Basic Metabolic Panel:  Recent Labs Lab 10/01/15 0518 10/02/15 0758 10/03/15 0400 10/03/15 1811 10/04/15 1003  NA 137 136 137 136 137  K 4.1 4.0 3.8 4.1 3.0*  CL 99* 99* 100* 99* 99*  CO2 27 30 32 29 30  GLUCOSE 124* 85 87 193* 134*  BUN 15 19 15 15 15   CREATININE 1.03 1.06 0.95 1.06 1.04  CALCIUM 8.8* 8.4* 8.3* 8.7* 8.5*  MG 2.0  --   --   --   --    Liver Function Tests:  Recent Labs Lab 09/30/15 1342 10/01/15 0518 10/02/15 0758 10/03/15 0400  AST 28 29 26 30   ALT 17 19 18 23   ALKPHOS 98 98 103 108  BILITOT 1.9* 1.8* 1.4* 1.3*  PROT 6.8 6.8 6.6 6.8  ALBUMIN 3.3* 3.2* 3.2* 3.0*   No results for input(s): LIPASE, AMYLASE in the last 168 hours. No results for input(s): AMMONIA in the last 168 hours. CBC:  Recent Labs Lab 09/30/15 1342 10/01/15 0518 10/02/15 0758 10/03/15 0400 10/04/15 1003  WBC 4.6 2.5* 9.8 9.9 9.2  NEUTROABS  --   --  8.0* 7.6 6.5  HGB 8.1* 8.4* 8.1* 8.5* 9.2*  HCT 29.2* 28.6* 28.1* 30.2* 32.6*  MCV 68.4* 67.8* 68.7* 68.2* 67.9*  PLT 150 137* 125* 121* 139*   Cardiac Enzymes:  Recent Labs Lab 09/30/15 1356  TROPONINI <0.03   BNP: Invalid input(s): POCBNP CBG: No results for input(s): GLUCAP in the last 168 hours.  Recent Results (from the past 240 hour(s))  Culture, blood (routine x 2) Call MD if unable to obtain prior to antibiotics being given     Status: None (Preliminary result)   Collection Time: 09/30/15  7:29 PM  Result Value Ref Range Status   Specimen Description BLOOD LEFT ANTECUBITAL  Final   Special Requests BOTTLES DRAWN AEROBIC AND ANAEROBIC 5CC  Final   Culture NO GROWTH 3 DAYS  Final   Report Status PENDING  Incomplete  Culture, blood  (routine x 2) Call MD if unable to obtain prior to antibiotics being given     Status: None (Preliminary result)   Collection Time: 09/30/15  7:30 PM  Result Value Ref Range Status   Specimen Description BLOOD RIGHT ANTECUBITAL  Final   Special Requests BOTTLES DRAWN AEROBIC ONLY 5CC  Final   Culture NO GROWTH 3 DAYS  Final   Report Status PENDING  Incomplete     Studies:              All Imaging reviewed and is as per above notation   Scheduled Meds: . albuterol  2.5 mg Nebulization QID  . benzonatate  200 mg Oral TID  . furosemide  80 mg Intravenous BID  . hydrALAZINE  50 mg Oral BID  . levofloxacin  750 mg Oral Q24H  . lisinopril  20 mg Oral Daily  . metoprolol tartrate  12.5 mg Oral BID  . polyethylene glycol  17 g Oral Daily  . potassium chloride  20 mEq Oral Daily  . potassium chloride  40 mEq Oral Daily  . predniSONE  20 mg Oral QAC breakfast  . senna-docusate  1 tablet Oral BID  . sodium chloride flush  3 mL Intravenous Q12H  . Warfarin - Pharmacist Dosing Inpatient   Does not apply q1800   Continuous Infusions:    Assessment/Plan:  Multifactorial acute hypoxic respiratory failure dyspnea-patient has infiltrate on chest x-ray  2 view,  Pneumonia-start community-acquired treatment with Levaquin by mouth 750 daily for 5 days. improved  ? Possible underlying COPD-chest x-ray reviewed by me not very impressive for hyperinflation-give nebulizations with albuterol every 4 scheduled and start prednisone 60 -->20 mg 10/04/2015  Mild decompensated diastolic heart failure EF 55-60 07/2015-pro BNP is 599 -Not sensitive in obesity JVD and elevated bilirubin concerning for central venous congestion of the liver-diuresis IV Lasix 80 twice a day-->by mouth 80 twice a day 10/04/2015,  metoprolol from 50 twice a day to 25 twice a day to allow for adequate diuresis  Obstructive sleep apnea not on BiPAP or Patient is unclear as to whether he had a sleep study in the past. I will put in  the name of a pulmonologist follow him up as an outpatient. He tells me that he tried to do the sleep study but could not sleep with the mask on his face. We will arrange a home nebulizer for him  Restrictive lung disease component given habitus-needs weight loss Body mass index is 39.03 kg/(m^2).   History of congestive heart failure-continue above medications in addition to hydralazine 50 twice a day, monitor daily weeks' strict ins  and outs Place Foley I/o -10 L since admission  weight down 122-->119-->116 kg-awaiting weight for this morning-  Permanent atrial fibrillation-continue above medications with changes, continue home dosage of Coumadin--PharmD to assist with dosing  Probable cirrhosis/NASH versus central venous congestion- Tells me that he has some occasional rectal bleeding has never had a colonoscopy and will need this organized as an outpatient would also suggest potentially a ultrasound of the right upper quadrant given his low platelet count and hemolytic pattern of his anemia   Severely microcytic anemia with slightly elevated bilirubin  He has occasional dark stool He is on warfarin for A. Fib  he will need a colonoscopy as an outpatient Await hemoglobin electrophoresis to rule out congenital hemolytic take anemia   hemoglobin has rebounded to 9.2 without any intervention  History hypertension, continue lisinopril 20 daily  Prior history CAD-continue above meds, continue aspirin 81 daily  Hypokalemia Continue replacement with K Dur dose increased to 40 mEq daily 10/04/2015    Transitioned to by mouth Lasix 10/04/15   Verneita Griffes, MD  [facility] Pager [phone] 10/04/2015, 12:26 PM    LOS: 4 days

## 2015-10-05 LAB — BASIC METABOLIC PANEL
Anion gap: 8 (ref 5–15)
BUN: 18 mg/dL (ref 6–20)
CHLORIDE: 98 mmol/L — AB (ref 101–111)
CO2: 31 mmol/L (ref 22–32)
CREATININE: 1.01 mg/dL (ref 0.61–1.24)
Calcium: 8.9 mg/dL (ref 8.9–10.3)
GFR calc Af Amer: >60 mL/min (ref >60)
GFR calc non Af Amer: >60 mL/min (ref >60)
Glucose, Bld: 65 mg/dL (ref 65–99)
Potassium: 3.7 mmol/L (ref 3.5–5.1)
Sodium: 137 mmol/L (ref 135–145)

## 2015-10-05 LAB — HEMOGLOBINOPATHY EVALUATION
HGB A2 QUANT: 1.7 % (ref 0.7–3.1)
HGB A: 98.3 % — AB (ref 94.0–98.0)
HGB C: 0 %
Hgb F Quant: 0 % (ref 0.0–2.0)
Hgb S Quant: 0 %

## 2015-10-05 LAB — MAGNESIUM: MAGNESIUM: 2.1 mg/dL (ref 1.7–2.4)

## 2015-10-05 LAB — CULTURE, BLOOD (ROUTINE X 2)
CULTURE: NO GROWTH
Culture: NO GROWTH

## 2015-10-05 LAB — PROTIME-INR
INR: 1.91 — AB (ref 0.00–1.49)
PROTHROMBIN TIME: 21.8 s — AB (ref 11.6–15.2)

## 2015-10-05 MED ORDER — POTASSIUM CHLORIDE CRYS ER 20 MEQ PO TBCR: 40.0000 meq | EXTENDED_RELEASE_TABLET | Freq: Every day | ORAL | Status: DC

## 2015-10-05 MED ORDER — METOPROLOL TARTRATE 25 MG PO TABS: 25.0000 mg | ORAL_TABLET | Freq: Two times a day (BID) | ORAL | Status: DC

## 2015-10-05 MED ORDER — PREDNISONE 10 MG PO TABS: 10.0000 mg | ORAL_TABLET | Freq: Every day | ORAL | Status: DC

## 2015-10-05 NOTE — Progress Notes (Signed)
Pt has orders to be discharged. Discharge instructions given and pt has no additional questions at this time. Medication regimen reviewed and pt educated. Pt verbalized understanding and has no additional questions. Telemetry box removed. IV removed and site in good condition. Pt stable and waiting for transportation. 

## 2015-10-05 NOTE — Discharge Summary (Signed)
Physician Discharge Summary  Steven Barnett [id number] DOB: 05-06-1951 DOA: 09/30/2015  PCP: Elisabeth Cara, PA-C  Admit date: 09/30/2015 Discharge date: 10/05/2015  Time spent: 45 minutes  Recommendations for Outpatient Follow-up:  1. Please follow-up hemoglobin electrophoresis-patient has microcytosis and anemia and he cannot tell me whether he is ever had a workup for the same-he also has a habitus of someone who has steatohepatitis probably from [place] and may benefit from hematology workup in the outpatient setting versus ultrasound of the abdomen 2. Patient should continue a burst of steroids 40 mg ending as per MAR 3. Patient will resume Lasix 80 twice a day and will need to follow-up closely with Dr. [name] of cardiology--specifically he may benefit from titration of his metoprolol 12.5 back to home dose of 50 twice a day because of acute heart failure component this was kept at a lower dose this admission 4. Patient has been counseled regarding regular exercise-he is sedentary at home and it was felt that this would benefit him in the long run given his poor functional status at baseline 5. He will need Chem-12 as well as a CBC in about one week 6. He should complete also course of Levaquin for healthcare associated pneumonia as per MAR 7. Will need outpatient colonoscopy given microcytic anemia-please arrange with gastroenterology 8. Cannot tolerate CPAP? May benefit from a non-emergent pulmonary reevaluation of the same 9. We will ask home health RN to come out and assess him to ensure that he understands his medications and is compliant with the same specifically with 1200 cc fluid restriction/low salt diet and may benefit also from outpatient nutritionist input 10. Given that patient does not have a significant history of CAD, although he has a high risk for ASCVD, he may benefit from not being on aspirin given he is already on warfarin for atrial fibrillation.   Discharge  Diagnoses:  Active Problems:   Pneumonia   PNA (pneumonia)   Discharge Condition: Improved   Diet recommendation: low salt hh 1200 cc  Filed Weights   10/03/15 0605 10/04/15 0511 10/05/15 0717  Weight: 117.572 kg (259 lb 3.2 oz) 116.166 kg (256 lb 1.6 oz) 115.803 kg (255 lb 4.8 oz)    History of present illness:  65 year old male Chronic diastolic heart failure-EF 55-60% 07/2015 baseline hemoglobin 10-11 range in January 2017 OSA not on CPAP on Revatio ventral hernia History of polysubstance abuse 2008 Hypertension\ history of right-sided thoracotomy for empyema 11/2008-Permanent atrial fibrillation since that time Body mass index is 39.03 kg/(m^2).  mild thrombocytopenia Microcytic anemia  presented to the emergency room 09/30/15 after 3-4 nights of poor sleep, cough without right wrist chills nor fever He denies any sputum, air pain, difficulty swallowing, myalgias. He states that family members in his household have been treated for respiratory illnesses with antibiotics-he is not sure exactly what. He does not have any burning in his urine nor does he have any subjective fever and did not feel the need to measure  Bradycardic and hypotensive 96/50 on admit Wbc 4.6 Hemoglobin 8.1 [baseline 10] MCV 68 PLT 150 Bun/creat 12/1.2 Bili 1.9  CXr 1 view ? Infiltrate   Hospital Course:  Multifactorial acute hypoxic respiratory failure dyspnea-patient has infiltrate on chest x-ray 2 view,  Pneumonia-start community-acquired treatment with Levaquin by mouth 750 daily for 5 days. improved  ? Possible underlying COPD-chest x-ray reviewed by me not very impressive for hyperinflation-give nebulizations with albuterol every 4 scheduled and start prednisone 60 -->20 mg 10/04/2015  Mild  decompensated diastolic heart failure EF 55-60 07/2015-pro BNP is 599 -Not sensitive in obesity JVD and elevated bilirubin concerning for central venous congestion of the liver-diuresis IV Lasix 80  twice a day-->by mouth 80 twice a day 10/04/2015, metoprolol from 50 twice a day to 25 twice a day to allow for adequate diuresis  Obstructive sleep apnea not on BiPAP or Patient is unclear as to whether he had a sleep study in the past. I will put in the name of a pulmonologist follow him up as an outpatient. He tells me that he tried to do the sleep study but could not sleep with the mask on his face. We will arrange a home nebulizer for him  Restrictive lung disease component given habitus-needs weight loss Body mass index is 39.03 kg/(m^2).   History of congestive heart failure-continue above medications in addition to hydralazine 50 twice a day, monitor daily weeks' strict ins and outs Place Foley I/o -11 L since admission  weight down 122-->119-->116 kg-115 kilograms on discharge No JVD on discharge Felt euvolemic on 10/05/15 and hence discharged  Permanent atrial fibrillation-continue above medications with changes, continue home dosage of Coumadin--PharmD to assist with dosing  Probable cirrhosis/NASH versus central venous congestion- Tells me that he has some occasional rectal bleeding has never had a colonoscopy and will need this organized as an outpatient would also suggest potentially a ultrasound of the right upper quadrant given his low platelet count and hemolytic pattern of his anemia   Severely microcytic anemia with slightly elevated bilirubin  He has occasional dark stool He is on warfarin for A. Fib  he will need a colonoscopy as an outpatient Await hemoglobin electrophoresis to rule out congenital hemolytic take anemia  hemoglobin has rebounded to 9.2 without any intervention See above recommendation  History hypertension, continue lisinopril 20 daily  Prior history CAD-continue above meds, continue aspirin 81 daily  Hypokalemia Continue replacement with K Dur dose increased to 40 mEq daily 10/04/2015    Transitioned to by mouth Lasix 10/04/15    Consultations:  none  Discharge Exam: Filed Vitals:   10/04/15 2030 10/05/15 0717  BP: 118/61 126/74  Pulse: 90 85  Temp: 97.9 F (36.6 C) 98.6 F (37 C)  Resp: 19 20    General: eomi ncat well no issues Cardiovascular: s1 s2 no m/r/g Respiratory: clear no rales no rhonchi  Discharge Instructions   Discharge Instructions    Diet - low sodium heart healthy    Complete by:  As directed      Discharge instructions    Complete by:  As directed   Take higher dose of lasix Monitor on this dose-get labs in about 1 week Check your weight with the same scale every day, if you gain > 2 lbs in a 24 hours period of time, tell you doctor.     Increase activity slowly    Complete by:  As directed           Current Discharge Medication List    START taking these medications   Details  albuterol (PROVENTIL HFA;VENTOLIN HFA) 108 (90 Base) MCG/ACT inhaler Inhale 2 puffs into the lungs every 6 (six) hours as needed for wheezing or shortness of breath. Qty: 1 Inhaler, Refills: 2    levofloxacin (LEVAQUIN) 750 MG tablet Take 1 tablet (750 mg total) by mouth daily. Qty: 4 tablet, Refills: 0    predniSONE (DELTASONE) 10 MG tablet Take 1 tablet (10 mg total) by mouth daily before breakfast. Qty:  14 tablet, Refills: 0      CONTINUE these medications which have CHANGED   Details  diltiazem (CARDIZEM) 90 MG tablet Take 1 tablet (90 mg total) by mouth daily. Qty: 30 tablet, Refills: 0    metoprolol tartrate (LOPRESSOR) 25 MG tablet Take 1 tablet (25 mg total) by mouth 2 (two) times daily. Qty: 30 tablet, Refills: 0    potassium chloride SA (K-DUR,KLOR-CON) 20 MEQ tablet Take 2 tablets (40 mEq total) by mouth daily. Qty: 60 tablet, Refills: 0      CONTINUE these medications which have NOT CHANGED   Details  aspirin 81 MG tablet Take 81 mg by mouth daily.    docusate sodium (COLACE) 100 MG capsule Take 1 capsule (100 mg total) by mouth 2 (two) times daily. For  constipation Qty: 60 capsule, Refills: 3    furosemide (LASIX) 80 MG tablet Take 1 tablet (80 mg total) by mouth 2 (two) times daily. Qty: 60 tablet, Refills: 3    hydrALAZINE (APRESOLINE) 50 MG tablet TAKE ONE TABLET BY MOUTH TWICE DAILY Qty: 60 tablet, Refills: 4    ipratropium (ATROVENT HFA) 17 MCG/ACT inhaler Inhale 2 puffs into the lungs as needed for wheezing.    lisinopril (PRINIVIL,ZESTRIL) 20 MG tablet TAKE ONE TABLET BY MOUTH ONCE DAILY Qty: 30 tablet, Refills: 11    sildenafil (REVATIO) 20 MG tablet TAKE TWO TO THREE TABLETS BY MOUTH ONCE DAILY AS NEEDED FOR  SEXUAL  ACTIVITY Qty: 30 tablet, Refills: 0    warfarin (COUMADIN) 4 MG tablet Take 2 tablets by mouth daily or as directed by coumadin clinic  Take Coumadin total 12mg  tonight 08/08/15. Qty: 60 tablet, Refills: 1      STOP taking these medications     acetaminophen (TYLENOL) 500 MG tablet        No Known Allergies    The results of significant diagnostics from this hospitalization (including imaging, microbiology, ancillary and laboratory) are listed below for reference.    Significant Diagnostic Studies: Dg Chest 2 View  09/30/2015  CLINICAL DATA:  Productive cough EXAM: CHEST  2 VIEW COMPARISON:  09/30/2015 FINDINGS: Cardiomegaly again noted. Persistent hazy right basilar atelectasis or infiltrate. Small right pleural effusion. Mild degenerative changes thoracic spine. There is no pulmonary edema. IMPRESSION: Persistent hazy right basilar atelectasis or infiltrate. Small right pleural effusion. No pulmonary edema Electronically Signed   By: Lahoma Crocker M.D.   On: 09/30/2015 18:58   Dg Chest Portable 1 View  09/30/2015  CLINICAL DATA:  Shortness of breath starting last night EXAM: PORTABLE CHEST 1 VIEW COMPARISON:  08/05/2015 FINDINGS: Cardiomegaly is noted. Degenerative changes bilateral shoulders. No pulmonary edema. There is hazy right basilar atelectasis or early infiltrate. IMPRESSION: Cardiomegaly.  Hazy  right basilar atelectasis or early infiltrate. Electronically Signed   By: Lahoma Crocker M.D.   On: 09/30/2015 14:06    Microbiology: Recent Results (from the past 240 hour(s))  Culture, blood (routine x 2) Call MD if unable to obtain prior to antibiotics being given     Status: None (Preliminary result)   Collection Time: 09/30/15  7:29 PM  Result Value Ref Range Status   Specimen Description BLOOD LEFT ANTECUBITAL  Final   Special Requests BOTTLES DRAWN AEROBIC AND ANAEROBIC 5CC  Final   Culture NO GROWTH 4 DAYS  Final   Report Status PENDING  Incomplete  Culture, blood (routine x 2) Call MD if unable to obtain prior to antibiotics being given     Status: None (  Preliminary result)   Collection Time: 09/30/15  7:30 PM  Result Value Ref Range Status   Specimen Description BLOOD RIGHT ANTECUBITAL  Final   Special Requests BOTTLES DRAWN AEROBIC ONLY 5CC  Final   Culture NO GROWTH 4 DAYS  Final   Report Status PENDING  Incomplete     Labs: Basic Metabolic Panel:  Recent Labs Lab 10/01/15 0518 10/02/15 0758 10/03/15 0400 10/03/15 1811 10/04/15 1003 10/05/15 0310  NA 137 136 137 136 137  --   K 4.1 4.0 3.8 4.1 3.0*  --   CL 99* 99* 100* 99* 99*  --   CO2 27 30 32 29 30  --   GLUCOSE 124* 85 87 193* 134*  --   BUN 15 19 15 15 15   --   CREATININE 1.03 1.06 0.95 1.06 1.04  --   CALCIUM 8.8* 8.4* 8.3* 8.7* 8.5*  --   MG 2.0  --   --   --   --  2.1   Liver Function Tests:  Recent Labs Lab 09/30/15 1342 10/01/15 0518 10/02/15 0758 10/03/15 0400  AST 28 29 26 30   ALT 17 19 18 23   ALKPHOS 98 98 103 108  BILITOT 1.9* 1.8* 1.4* 1.3*  PROT 6.8 6.8 6.6 6.8  ALBUMIN 3.3* 3.2* 3.2* 3.0*   No results for input(s): LIPASE, AMYLASE in the last 168 hours. No results for input(s): AMMONIA in the last 168 hours. CBC:  Recent Labs Lab 09/30/15 1342 10/01/15 0518 10/02/15 0758 10/03/15 0400 10/04/15 1003  WBC 4.6 2.5* 9.8 9.9 9.2  NEUTROABS  --   --  8.0* 7.6 6.5  HGB 8.1*  8.4* 8.1* 8.5* 9.2*  HCT 29.2* 28.6* 28.1* 30.2* 32.6*  MCV 68.4* 67.8* 68.7* 68.2* 67.9*  PLT 150 137* 125* 121* 139*   Cardiac Enzymes:  Recent Labs Lab 09/30/15 1356  TROPONINI <0.03   BNP: BNP (last 3 results)  Recent Labs  07/08/15 1601 08/05/15 1055 09/30/15 1443  BNP 569.0* 571.9* 599.0*    ProBNP (last 3 results) No results for input(s): PROBNP in the last 8760 hours.  CBG: No results for input(s): GLUCAP in the last 168 hours.     SignedNita Sells MD   [facility] 10/05/2015, 9:53 AM

## 2015-10-05 NOTE — Progress Notes (Signed)
Patient ambulated in hallway. Oxygen saturations remained above 90%. No complications.

## 2015-10-08 ENCOUNTER — Encounter: Payer: Self-pay | Admitting: Internal Medicine

## 2015-10-08 ENCOUNTER — Ambulatory Visit (INDEPENDENT_AMBULATORY_CARE_PROVIDER_SITE_OTHER): Payer: Medicaid Other | Admitting: Internal Medicine

## 2015-10-08 VITALS — BP 107/59 | HR 82 | Ht 71.0 in | Wt 271.2 lb

## 2015-10-08 DIAGNOSIS — I10 Essential (primary) hypertension: Secondary | ICD-10-CM | POA: Diagnosis not present

## 2015-10-08 DIAGNOSIS — I5033 Acute on chronic diastolic (congestive) heart failure: Secondary | ICD-10-CM

## 2015-10-08 DIAGNOSIS — I482 Chronic atrial fibrillation: Secondary | ICD-10-CM

## 2015-10-08 DIAGNOSIS — J189 Pneumonia, unspecified organism: Secondary | ICD-10-CM | POA: Diagnosis not present

## 2015-10-08 DIAGNOSIS — I4821 Permanent atrial fibrillation: Secondary | ICD-10-CM

## 2015-10-08 NOTE — Patient Instructions (Addendum)
Prednisone Taper -- Take 2 tablet (40mg ) for 2 days (Friday 4/7 and Saturday 4/8) -- Take 1 tablet (20mg ) for 2 days (sunday 4/9 and Monday 4/10) -- Then stop  Keep appointments with [facility] & Dr. [name]

## 2015-10-09 NOTE — Progress Notes (Signed)
Marland Kitchen    OFFICE NOTE   Chief Complaint:  Hospital follow-up  Primary Care Physician: Elisabeth Cara, PA-C  HPI:  Steven Barnett is a 65 year old morbidly obese African American male with a history significant for hypertension, sleep disorder consistent with obstructive sleep apnea, recent diagnosis of diastolic heart failure with moderate LVH and recent diagnosis of postoperative atrial fibrillation (continues to be in atrial fibrillation) presenting with atypical chest pain. The patient was hospitalized in May 2010 and diagnosed with a right-sided empyema, for which he underwent a thoracotomy. He was found to be in atrial fibrillation postoperatively and has been in this rhythm ever since. At that time, he was also diagnosed with diastolic heart failure and found to have moderate left ventricular hypertrophy by echocardiogram.   Unfortunately, he was incarcerated for the past several years and has not been followed by cardiologist. His warfarin has been continued while in jail however was not adjusted regularly. He recently saw a new primary care provider with [facility] regional physicians and he is here to reestablish cardiac care and followup of his warfarin. He remains in atrial fibrillation today. He reports shortness of breath which is unchanged.  He denies any chest pain. He does report some leg swelling. He has problems with his knees from arthritis and pain. He also has not been able to lose weight. Finally, he has a history of sleep apnea but was never treated for this. He reports poor sleep at night and daytime fatigue and sometimes feels like he doesn't want to fall asleep because he feels like he is going to stop breathing. There is witnessed apnea.  I saw Mr. Malina in the office today. His main complaints are with erectile dysfunction. He is interested in Viagra. Denies any chest pain and reports good sleep. He is therapeutic on warfarin and is in permanent atrial fibrillation.  Mr.  Ducker returns to the office today. He is inquiring about getting generic sildenafil as Viagra was not covered. He also wants to be referred for colonoscopy. I told him that would be up to his primary care provider but he said "I thought you were my primary care provider". I clarified the differences in of encouraged him to follow-up with his primary care provider with [facility] physicians. We will go ahead and refer him for screening colonoscopy. He was advised that he need to hold warfarin for least 5 days prior to colonoscopy.  I saw Mr. Nosbisch back in the office today. He was seen this summer by Tarri Fuller, PA-C, for evaluation of cough and shortness of breath. A chest x-ray demonstrated possible pneumonia and was treated with doxycycline. He's not sure if this actually improved his symptoms or not. He recently has been absent for monthly warfarin checks. He says that he was not notified by the office however monthly appointments are scheduled and call also been made. He would like to arrange for a set day for him to come and get his warfarin checked.  Mr. Ayles returns today for follow-up. He was recently hospitalized with acute on chronic diastolic heart failure. He had significant weight gain and edema with shortness of breath. He was diuresed and discharged in much better condition. He follows up today in the office. He says that he has not been entirely compliant with his medications, due to some confusion. His blood pressure today is 150/90 with a heart rate of 90. He says he's been taking 25 mg or 1 tablet of Lopressor twice daily and set  of 2 tablets twice daily which was recommended. Weight on discharge was 264 and today is 273 although he measured to 69 at home. He denies any worsening swelling or shortness of breath.  Mr. Diel returns again today for hospital follow-up. He was last seen by myself on March 1 and that was for hospital follow-up. He is now seen again for another hospital follow-up.  Unfortunately he redeveloped a pneumonia and had acute on chronic congestive heart failure. He had significant diuresis and treated with antibiotics and steroids. He was discharged and reports his breathing has improved. He is now on 80 mg Lasix twice a day. He had confusing directions on prednisone. He has 60 mg tablets but his medication list indicated 10 mg tablets. He was advised to taper down, but he did not how to do that with the 60 mg tablets. Currently he is taking 20 mg 3 times a day. Weight is actually 2 pounds lower than discharge.  PMHx:  Past Medical History  Diagnosis Date  . Hypertension   . Permanent atrial fibrillation ([place]) 2010  . Chronic anticoagulation 2010    Coumadin  . Chronic diastolic CHF (congestive heart failure), NYHA class 2 ([place])   . Pneumonia 09/30/2015  . OSA (obstructive sleep apnea) 2016    "couldn't take the mask during the testing" (10/01/2015)  . Arthritis     "right knee" (10/01/2015)    Past Surgical History  Procedure Laterality Date  . Thoracotomy Right 2010    FAMHx:  Family History  Problem Relation Age of Onset  . Hypertension Mother   . Diabetes Mother   . Hyperlipidemia Mother   . Heart disease Father   . Heart failure Father   . Heart attack Neg Hx   . Stroke Maternal Grandmother   . Stroke Sister     SOCHx:   reports that he quit smoking about 40 years ago. His smoking use included Cigarettes. He smoked 0.10 packs per day. He has never used smokeless tobacco. He reports that he drinks alcohol. He reports that he uses illicit drugs (Cocaine and Marijuana).  ALLERGIES:  No Known Allergies  ROS: Pertinent items noted in HPI and remainder of comprehensive ROS otherwise negative.  HOME MEDS: Current Outpatient Prescriptions  Medication Sig Dispense Refill  . albuterol (PROVENTIL HFA;VENTOLIN HFA) 108 (90 Base) MCG/ACT inhaler Inhale 2 puffs into the lungs every 6 (six) hours as needed for wheezing or shortness of breath. 1  Inhaler 2  . aspirin 81 MG tablet Take 81 mg by mouth daily.    Marland Kitchen diltiazem (CARDIZEM) 90 MG tablet Take 1 tablet (90 mg total) by mouth daily. 30 tablet 0  . docusate sodium (COLACE) 100 MG capsule Take 1 capsule (100 mg total) by mouth 2 (two) times daily. For constipation 60 capsule 3  . furosemide (LASIX) 80 MG tablet Take 1 tablet (80 mg total) by mouth 2 (two) times daily. 60 tablet 3  . hydrALAZINE (APRESOLINE) 50 MG tablet TAKE ONE TABLET BY MOUTH TWICE DAILY 60 tablet 4  . ipratropium (ATROVENT HFA) 17 MCG/ACT inhaler Inhale 2 puffs into the lungs as needed for wheezing.    Marland Kitchen levofloxacin (LEVAQUIN) 750 MG tablet Take 1 tablet (750 mg total) by mouth daily. 4 tablet 0  . lisinopril (PRINIVIL,ZESTRIL) 20 MG tablet TAKE ONE TABLET BY MOUTH ONCE DAILY 30 tablet 11  . metoprolol tartrate (LOPRESSOR) 25 MG tablet Take 1 tablet (25 mg total) by mouth 2 (two) times daily. 30 tablet 0  .  potassium chloride SA (K-DUR,KLOR-CON) 20 MEQ tablet Take 2 tablets (40 mEq total) by mouth daily. 60 tablet 0  . predniSONE (DELTASONE) 20 MG tablet Take 2 tablets for 2 days (4/7 and 4/8). Decrease to 1 tablet daily for 2 days (4/9 and 4/10). Then STOP    . sildenafil (REVATIO) 20 MG tablet TAKE TWO TO THREE TABLETS BY MOUTH ONCE DAILY AS NEEDED FOR  SEXUAL  ACTIVITY 30 tablet 0  . warfarin (COUMADIN) 4 MG tablet Take 2 tablets by mouth daily or as directed by coumadin clinic  Take Coumadin total 12mg  tonight 08/08/15. 60 tablet 1   No current facility-administered medications for this visit.    LABS/IMAGING: No results found for this or any previous visit (from the past 48 hour(s)). No results found.  VITALS: BP 107/59 mmHg  Pulse 82  Ht 5\' 11"  (1.803 m)  Wt 271 lb 3.2 oz (123.016 kg)  BMI 37.84 kg/m2  EXAM: General appearance: alert, no distress and morbidly obese Neck: no carotid bruit and no JVD Lungs: clear to auscultation bilaterally Heart: irregularly irregular rhythm Abdomen: soft,  non-tender; bowel sounds normal; no masses,  no organomegaly Extremities: edema trace pitting edema Pulses: 2+ and symmetric Skin: Skin color, texture, turgor normal. No rashes or lesions Neurologic: Grossly normal Psych: Pleasant  EKG: Deferred  ASSESSMENT: 1. Permanent atrial fibrillation 2. Morbid obesity 3. Obstructive sleep apnea-not on CPAP 4. Hypertension controlled 5. Leg edema 6. Bilateral knee osteoarthritis 7. Erectile dysfunction 8. Recent diastolic congestive heart failure 9. Recent pneumonia  PLAN: 1.   Mr. Loury appears to be again compensated with congestive heart failure. He is still getting over pneumonia and has mild nonproductive cough. I encouraged him to continue to taper his steroids which we will arrange and I advised him to take 40 mg for 3 days then 20 mg for 3 days then to stop. He should continue his current dose of diuretics. Will plan to see him back in one month which is his next regularly scheduled appointment.  Pixie Casino, MD, [facility] Attending Cardiologist Morland C Hilty 10/09/2015, 6:00 PM

## 2015-10-19 ENCOUNTER — Encounter: Payer: Medicaid Other | Admitting: Pharmacist Clinician (PhC)/ Clinical Pharmacy Specialist

## 2015-10-20 ENCOUNTER — Ambulatory Visit (INDEPENDENT_AMBULATORY_CARE_PROVIDER_SITE_OTHER): Payer: Medicaid Other | Admitting: Pharmacist Clinician (PhC)/ Clinical Pharmacy Specialist

## 2015-10-20 DIAGNOSIS — I482 Chronic atrial fibrillation: Secondary | ICD-10-CM | POA: Diagnosis not present

## 2015-10-20 DIAGNOSIS — Z7901 Long term (current) use of anticoagulants: Secondary | ICD-10-CM | POA: Diagnosis not present

## 2015-10-20 DIAGNOSIS — I4821 Permanent atrial fibrillation: Secondary | ICD-10-CM

## 2015-10-20 LAB — POCT INR: INR: 2.9

## 2015-10-29 ENCOUNTER — Other Ambulatory Visit: Payer: Self-pay | Admitting: Internal Medicine

## 2015-10-29 NOTE — Telephone Encounter (Signed)
Rx request sent to pharmacy.  

## 2015-11-08 ENCOUNTER — Other Ambulatory Visit: Payer: Self-pay | Admitting: Pharmacist Clinician (PhC)/ Clinical Pharmacy Specialist

## 2015-11-09 ENCOUNTER — Telehealth: Payer: Self-pay | Admitting: Internal Medicine

## 2015-11-09 NOTE — Telephone Encounter (Signed)
Thanks .. I agree with that plan.  Dr. Lemmie Evens

## 2015-11-09 NOTE — Telephone Encounter (Signed)
Returned call. HHRN states pt up 5 lbs since seen last Friday. Notes he has visible LE swelling, adventitious lung sounds. Pt endorses eating rich meals over weekend - acknowledges likely incr salt intake. Pt having some joint pain, also.  Last BMET stable. Advised extra 40mg  lasix x2-3 days, to start today w/ afternoon dose.  Recommended leg elevation, avoidance of salt rich foods.  Regarding joint pain -- pt hx of gout, arthritis. Advised to call PCP for recommendations if joint pain not improved w diminishing of swelling.  Routed to Dr. [name] for any further recommendations.

## 2015-11-09 NOTE — Telephone Encounter (Signed)
New message     Gained 5 lbs within 2 days.  He has swelling  and crackles in his lung base.  Do you want him to take an extra fluid pill?  Pt also has pain in elbow and has a history of gout.  He is on lasix 80mg  bid.  Please advise

## 2015-11-09 NOTE — Telephone Encounter (Signed)
Left msg for [facility] advising of instructions as given and to call back w/ update this week or sooner if any concerns.

## 2015-11-09 NOTE — Telephone Encounter (Signed)
Routed to anticoag.

## 2015-11-11 ENCOUNTER — Ambulatory Visit (INDEPENDENT_AMBULATORY_CARE_PROVIDER_SITE_OTHER): Payer: Medicaid Other | Admitting: Pharmacist

## 2015-11-11 DIAGNOSIS — Z7901 Long term (current) use of anticoagulants: Secondary | ICD-10-CM | POA: Diagnosis not present

## 2015-11-11 DIAGNOSIS — I482 Chronic atrial fibrillation: Secondary | ICD-10-CM | POA: Diagnosis not present

## 2015-11-11 DIAGNOSIS — I4821 Permanent atrial fibrillation: Secondary | ICD-10-CM

## 2015-11-11 LAB — POCT INR: INR: 2.6

## 2015-11-12 DIAGNOSIS — I482 Chronic atrial fibrillation: Secondary | ICD-10-CM | POA: Diagnosis not present

## 2015-11-12 DIAGNOSIS — I5032 Chronic diastolic (congestive) heart failure: Secondary | ICD-10-CM | POA: Diagnosis not present

## 2015-11-12 DIAGNOSIS — I110 Hypertensive heart disease with heart failure: Secondary | ICD-10-CM | POA: Diagnosis not present

## 2015-11-12 DIAGNOSIS — G4733 Obstructive sleep apnea (adult) (pediatric): Secondary | ICD-10-CM | POA: Diagnosis not present

## 2015-12-01 ENCOUNTER — Other Ambulatory Visit: Payer: Self-pay | Admitting: *Deleted

## 2015-12-01 MED ORDER — METOPROLOL TARTRATE 25 MG PO TABS: 25.0000 mg | ORAL_TABLET | Freq: Two times a day (BID) | ORAL | Status: DC

## 2015-12-03 ENCOUNTER — Encounter: Payer: Self-pay | Admitting: Internal Medicine

## 2015-12-03 ENCOUNTER — Ambulatory Visit (INDEPENDENT_AMBULATORY_CARE_PROVIDER_SITE_OTHER): Payer: Medicaid Other | Admitting: Pharmacist

## 2015-12-03 ENCOUNTER — Ambulatory Visit (INDEPENDENT_AMBULATORY_CARE_PROVIDER_SITE_OTHER): Payer: Medicaid Other | Admitting: Internal Medicine

## 2015-12-03 VITALS — BP 114/72 | HR 124 | Ht 70.0 in | Wt 256.0 lb

## 2015-12-03 DIAGNOSIS — Z7901 Long term (current) use of anticoagulants: Secondary | ICD-10-CM

## 2015-12-03 DIAGNOSIS — G4733 Obstructive sleep apnea (adult) (pediatric): Secondary | ICD-10-CM | POA: Diagnosis not present

## 2015-12-03 DIAGNOSIS — I4821 Permanent atrial fibrillation: Secondary | ICD-10-CM

## 2015-12-03 DIAGNOSIS — I5033 Acute on chronic diastolic (congestive) heart failure: Secondary | ICD-10-CM

## 2015-12-03 DIAGNOSIS — I482 Chronic atrial fibrillation: Secondary | ICD-10-CM | POA: Diagnosis not present

## 2015-12-03 LAB — POCT INR: INR: 1.4

## 2015-12-03 MED ORDER — METOPROLOL TARTRATE 25 MG PO TABS: 25.0000 mg | ORAL_TABLET | Freq: Two times a day (BID) | ORAL | Status: DC

## 2015-12-03 NOTE — Patient Instructions (Signed)
Your physician recommends that you schedule a follow-up appointment in: [place] with Dr. [name]  Your metoprolol has been refilled. Please pick this prescription up from the pharmacy

## 2015-12-03 NOTE — Progress Notes (Signed)
Marland Kitchen    OFFICE NOTE   Chief Complaint:  Follow-up  Primary Care Physician: Elisabeth Cara, PA-C  HPI:  Steven Barnett is a 65 year old morbidly obese African American male with a history significant for hypertension, sleep disorder consistent with obstructive sleep apnea, recent diagnosis of diastolic heart failure with moderate LVH and recent diagnosis of postoperative atrial fibrillation (continues to be in atrial fibrillation) presenting with atypical chest pain. The patient was hospitalized in May 2010 and diagnosed with a right-sided empyema, for which he underwent a thoracotomy. He was found to be in atrial fibrillation postoperatively and has been in this rhythm ever since. At that time, he was also diagnosed with diastolic heart failure and found to have moderate left ventricular hypertrophy by echocardiogram.   Unfortunately, he was incarcerated for the past several years and has not been followed by cardiologist. His warfarin has been continued while in jail however was not adjusted regularly. He recently saw a new primary care provider with [facility] regional physicians and he is here to reestablish cardiac care and followup of his warfarin. He remains in atrial fibrillation today. He reports shortness of breath which is unchanged.  He denies any chest pain. He does report some leg swelling. He has problems with his knees from arthritis and pain. He also has not been able to lose weight. Finally, he has a history of sleep apnea but was never treated for this. He reports poor sleep at night and daytime fatigue and sometimes feels like he doesn't want to fall asleep because he feels like he is going to stop breathing. There is witnessed apnea.  I saw [name] in the office today. His main complaints are with erectile dysfunction. He is interested in Viagra. Denies any chest pain and reports good sleep. He is therapeutic on warfarin and is in permanent atrial fibrillation.  [name] returns  to the office today. He is inquiring about getting generic sildenafil as Viagra was not covered. He also wants to be referred for colonoscopy. I told him that would be up to his primary care provider but he said "I thought you were my primary care provider". I clarified the differences in of encouraged him to follow-up with his primary care provider with [facility] physicians. We will go ahead and refer him for screening colonoscopy. He was advised that he need to hold warfarin for least 5 days prior to colonoscopy.  I saw [name] back in the office today. He was seen this summer by Tarri Fuller, PA-C, for evaluation of cough and shortness of breath. A chest x-ray demonstrated possible pneumonia and was treated with doxycycline. He's not sure if this actually improved his symptoms or not. He recently has been absent for monthly warfarin checks. He says that he was not notified by the office however monthly appointments are scheduled and call also been made. He would like to arrange for a set day for him to come and get his warfarin checked.  Mr. [name] returns today for follow-up. He was recently hospitalized with acute on chronic diastolic heart failure. He had significant weight gain and edema with shortness of breath. He was diuresed and discharged in much better condition. He follows up today in the office. He says that he has not been entirely compliant with his medications, due to some confusion. His blood pressure today is 150/90 with a heart rate of 90. He says he's been taking 25 mg or 1 tablet of Lopressor twice daily and set of  2 tablets twice daily which was recommended. Weight on discharge was 264 and today is 273 although he measured to 69 at home. He denies any worsening swelling or shortness of breath.  [name] returns again today for hospital follow-up. He was last seen by myself on March 1 and that was for hospital follow-up. He is now seen again for another hospital follow-up. Unfortunately he  redeveloped a pneumonia and had acute on chronic congestive heart failure. He had significant diuresis and treated with antibiotics and steroids. He was discharged and reports his breathing has improved. He is now on 80 mg Lasix twice a day. He had confusing directions on prednisone. He has 60 mg tablets but his medication list indicated 10 mg tablets. He was advised to taper down, but he did not how to do that with the 60 mg tablets. Currently he is taking 20 mg 3 times a day. Weight is actually 2 pounds lower than discharge.  12/03/2015  [name] was seen back today in the office for follow-up. His heart rate was elevated today 124 and he reports he ran out of his metoprolol despite there being an active order in the pharmacy. We checked on that today. He apparently is taking diltiazem 90 mg daily. His INR was also recheck today and is subtherapeutic at 1.4. This is concerning for ongoing medication noncompliance. Spiked the increased heart rate he denies any worsening shortness of breath or chest pain.  PMHx:  Past Medical History  Diagnosis Date  . Hypertension   . Permanent atrial fibrillation ([place]) 2010  . Chronic anticoagulation 2010    Coumadin  . Chronic diastolic CHF (congestive heart failure), NYHA class 2 ([place])   . Pneumonia 09/30/2015  . OSA (obstructive sleep apnea) 2016    "couldn't take the mask during the testing" (10/01/2015)  . Arthritis     "right knee" (10/01/2015)    Past Surgical History  Procedure Laterality Date  . Thoracotomy Right 2010    FAMHx:  Family History  Problem Relation Age of Onset  . Hypertension Mother   . Diabetes Mother   . Hyperlipidemia Mother   . Heart disease Father   . Heart failure Father   . Heart attack Neg Hx   . Stroke Maternal Grandmother   . Stroke Sister     SOCHx:   reports that he quit smoking about 40 years ago. His smoking use included Cigarettes. He smoked 0.10 packs per day. He has never used smokeless tobacco. He reports  that he drinks alcohol. He reports that he uses illicit drugs (Cocaine and Marijuana).  ALLERGIES:  No Known Allergies  ROS: Pertinent items noted in HPI and remainder of comprehensive ROS otherwise negative.  HOME MEDS: Current Outpatient Prescriptions  Medication Sig Dispense Refill  . albuterol (PROVENTIL HFA;VENTOLIN HFA) 108 (90 Base) MCG/ACT inhaler Inhale 2 puffs into the lungs every 6 (six) hours as needed for wheezing or shortness of breath. 1 Inhaler 2  . aspirin 81 MG tablet Take 81 mg by mouth daily.    Marland Kitchen diltiazem (CARDIZEM) 90 MG tablet Take 1 tablet (90 mg total) by mouth daily. 30 tablet 0  . docusate sodium (COLACE) 100 MG capsule Take 1 capsule (100 mg total) by mouth 2 (two) times daily. For constipation 60 capsule 3  . furosemide (LASIX) 80 MG tablet Take 1 tablet (80 mg total) by mouth 2 (two) times daily. 60 tablet 3  . hydrALAZINE (APRESOLINE) 50 MG tablet TAKE ONE TABLET BY MOUTH  TWICE DAILY 60 tablet 4  . ipratropium (ATROVENT HFA) 17 MCG/ACT inhaler Inhale 2 puffs into the lungs as needed for wheezing.    Marland Kitchen levofloxacin (LEVAQUIN) 750 MG tablet Take 1 tablet (750 mg total) by mouth daily. 4 tablet 0  . lisinopril (PRINIVIL,ZESTRIL) 20 MG tablet TAKE ONE TABLET BY MOUTH ONCE DAILY 30 tablet 11  . metoprolol tartrate (LOPRESSOR) 25 MG tablet Take 1 tablet (25 mg total) by mouth 2 (two) times daily. 30 tablet 5  . potassium chloride SA (K-DUR,KLOR-CON) 20 MEQ tablet Take 2 tablets (40 mEq total) by mouth daily. 60 tablet 0  . predniSONE (DELTASONE) 20 MG tablet Take 2 tablets for 2 days (4/7 and 4/8). Decrease to 1 tablet daily for 2 days (4/9 and 4/10). Then STOP    . sildenafil (REVATIO) 20 MG tablet TAKE TWO TO THREE TABLETS BY MOUTH ONCE DAILY AS NEEDED FOR  SEXUAL  ACTIVITY 30 tablet 2  . warfarin (COUMADIN) 4 MG tablet Take 2 tablets by mouth daily or as directed by coumadin clinic  Take Coumadin total 12mg  tonight 08/08/15. 60 tablet 1  . warfarin (COUMADIN) 4  MG tablet TAKE TWO TABLETS BY MOUTH ONCE DAILY OR  AS  DIRECTED  BY  THE  COUMADIN  CLINIC 60 tablet 1   No current facility-administered medications for this visit.    LABS/IMAGING: No results found for this or any previous visit (from the past 48 hour(s)). No results found.  VITALS: BP 114/72 mmHg  Pulse 124  Ht 5\' 10"  (1.778 m)  Wt 256 lb (116.121 kg)  BMI 36.73 kg/m2  EXAM: General appearance: alert, no distress and morbidly obese Neck: no carotid bruit and no JVD Lungs: clear to auscultation bilaterally Heart: irregularly irregular rhythm Abdomen: soft, non-tender; bowel sounds normal; no masses,  no organomegaly Extremities: edema trace pitting edema Pulses: 2+ and symmetric Skin: Skin color, texture, turgor normal. No rashes or lesions Neurologic: Grossly normal Psych: Pleasant  EKG: Atrial fibrillation at 124  ASSESSMENT: 1. Permanent atrial fibrillation - on a clear thick coagulated on warfarin, CHADSVASC score of 3 2. Morbid obesity 3. Obstructive sleep apnea-not on CPAP 4. Hypertension controlled 5. Leg edema 6. Bilateral knee osteoarthritis 7. Erectile dysfunction 8. Recent diastolic congestive heart failure 9. Recent pneumonia 10. Medication noncompliance  PLAN: 1.   Mr. Dort presents today with tachycardia although blood pressure is well-controlled. He is been out of his beta blocker for 1-2 weeks. Apparently he try to pick this up at the pharmacy and they said they didn't have it although the prescription is clearly there. We reset it today and I encouraged him to pick that up and start taking it. His INR is also subtherapeutic today. We gave him directions on adjusting his warfarin and will be checked again shortly.  Follow-up with me in 3 months.  Pixie Casino, MD, [facility] Attending Cardiologist Lewellen C Hilty 12/03/2015, 9:30 AM

## 2015-12-16 ENCOUNTER — Other Ambulatory Visit: Payer: Self-pay | Admitting: Internal Medicine

## 2015-12-16 MED ORDER — POTASSIUM CHLORIDE CRYS ER 20 MEQ PO TBCR: 40.0000 meq | EXTENDED_RELEASE_TABLET | Freq: Every day | ORAL | Status: DC

## 2015-12-16 NOTE — Telephone Encounter (Signed)
°*  STAT* If patient is at the pharmacy, call can be transferred to refill team.   1. Which medications need to be refilled? (please list name of each medication and dose if known) Potassium medicine-he could not remember the name of it**  2. Which pharmacy/location (including street and city if local pharmacy) is medication to be sent to?[facility] 3. Do they need a 30 day or 90 day supply? 90days and refills

## 2015-12-17 ENCOUNTER — Telehealth: Payer: Self-pay | Admitting: Internal Medicine

## 2015-12-17 NOTE — Telephone Encounter (Signed)
Mailed signed orders r/t [facility] visits, parameters, ec to [facility] made for records

## 2015-12-18 ENCOUNTER — Telehealth: Payer: Self-pay | Admitting: Internal Medicine

## 2015-12-18 NOTE — Telephone Encounter (Signed)
Signed home health certification & plan of care mailed to [facility] Copy made for records

## 2015-12-25 ENCOUNTER — Ambulatory Visit (INDEPENDENT_AMBULATORY_CARE_PROVIDER_SITE_OTHER): Payer: Medicaid Other | Admitting: Pharmacist Clinician (PhC)/ Clinical Pharmacy Specialist

## 2015-12-25 DIAGNOSIS — I4821 Permanent atrial fibrillation: Secondary | ICD-10-CM

## 2015-12-25 DIAGNOSIS — Z7901 Long term (current) use of anticoagulants: Secondary | ICD-10-CM | POA: Diagnosis not present

## 2015-12-25 DIAGNOSIS — I482 Chronic atrial fibrillation: Secondary | ICD-10-CM

## 2015-12-25 LAB — POCT INR: INR: 2.4

## 2016-01-15 ENCOUNTER — Other Ambulatory Visit: Payer: Self-pay | Admitting: *Deleted

## 2016-01-15 ENCOUNTER — Other Ambulatory Visit: Payer: Self-pay | Admitting: Internal Medicine

## 2016-01-15 MED ORDER — WARFARIN SODIUM 4 MG PO TABS
ORAL_TABLET | ORAL | Status: DC
Start: 2016-01-15 — End: 2016-02-19

## 2016-01-18 ENCOUNTER — Other Ambulatory Visit: Payer: Self-pay | Admitting: Physician Assistant

## 2016-01-25 ENCOUNTER — Ambulatory Visit (INDEPENDENT_AMBULATORY_CARE_PROVIDER_SITE_OTHER): Payer: Medicaid Other | Admitting: Pharmacist

## 2016-01-25 DIAGNOSIS — I4821 Permanent atrial fibrillation: Secondary | ICD-10-CM

## 2016-01-25 DIAGNOSIS — Z7901 Long term (current) use of anticoagulants: Secondary | ICD-10-CM | POA: Diagnosis not present

## 2016-01-25 DIAGNOSIS — I482 Chronic atrial fibrillation: Secondary | ICD-10-CM

## 2016-01-25 LAB — POCT INR: INR: 2.2

## 2016-02-02 ENCOUNTER — Emergency Department (HOSPITAL_BASED_OUTPATIENT_CLINIC_OR_DEPARTMENT_OTHER): Payer: Medicaid Other

## 2016-02-02 ENCOUNTER — Inpatient Hospital Stay (HOSPITAL_BASED_OUTPATIENT_CLINIC_OR_DEPARTMENT_OTHER)
Admission: EM | Admit: 2016-02-02 | Discharge: 2016-02-06 | DRG: 291 | Disposition: A | Discharge status: Home / Self Care | Payer: Medicaid Other | Attending: Internal Medicine | Admitting: Internal Medicine

## 2016-02-02 ENCOUNTER — Encounter (HOSPITAL_BASED_OUTPATIENT_CLINIC_OR_DEPARTMENT_OTHER): Payer: Self-pay | Admitting: *Deleted

## 2016-02-02 DIAGNOSIS — I481 Persistent atrial fibrillation: Secondary | ICD-10-CM

## 2016-02-02 DIAGNOSIS — D649 Anemia, unspecified: Secondary | ICD-10-CM

## 2016-02-02 DIAGNOSIS — Z7982 Long term (current) use of aspirin: Secondary | ICD-10-CM | POA: Diagnosis not present

## 2016-02-02 DIAGNOSIS — E876 Hypokalemia: Secondary | ICD-10-CM | POA: Diagnosis present

## 2016-02-02 DIAGNOSIS — I5032 Chronic diastolic (congestive) heart failure: Secondary | ICD-10-CM | POA: Diagnosis present

## 2016-02-02 DIAGNOSIS — R001 Bradycardia, unspecified: Secondary | ICD-10-CM | POA: Diagnosis present

## 2016-02-02 DIAGNOSIS — Z8249 Family history of ischemic heart disease and other diseases of the circulatory system: Secondary | ICD-10-CM | POA: Diagnosis not present

## 2016-02-02 DIAGNOSIS — I5033 Acute on chronic diastolic (congestive) heart failure: Secondary | ICD-10-CM | POA: Diagnosis present

## 2016-02-02 DIAGNOSIS — Z6836 Body mass index (BMI) 36.0-36.9, adult: Secondary | ICD-10-CM

## 2016-02-02 DIAGNOSIS — D509 Iron deficiency anemia, unspecified: Secondary | ICD-10-CM | POA: Diagnosis present

## 2016-02-02 DIAGNOSIS — I110 Hypertensive heart disease with heart failure: Secondary | ICD-10-CM | POA: Diagnosis present

## 2016-02-02 DIAGNOSIS — Z7901 Long term (current) use of anticoagulants: Secondary | ICD-10-CM | POA: Diagnosis not present

## 2016-02-02 DIAGNOSIS — Z79899 Other long term (current) drug therapy: Secondary | ICD-10-CM

## 2016-02-02 DIAGNOSIS — J189 Pneumonia, unspecified organism: Secondary | ICD-10-CM | POA: Diagnosis present

## 2016-02-02 DIAGNOSIS — Z09 Encounter for follow-up examination after completed treatment for conditions other than malignant neoplasm: Secondary | ICD-10-CM

## 2016-02-02 DIAGNOSIS — I482 Chronic atrial fibrillation: Secondary | ICD-10-CM | POA: Diagnosis present

## 2016-02-02 DIAGNOSIS — Z8371 Family history of colonic polyps: Secondary | ICD-10-CM | POA: Diagnosis not present

## 2016-02-02 DIAGNOSIS — J9601 Acute respiratory failure with hypoxia: Secondary | ICD-10-CM | POA: Diagnosis present

## 2016-02-02 DIAGNOSIS — I10 Essential (primary) hypertension: Secondary | ICD-10-CM | POA: Diagnosis present

## 2016-02-02 DIAGNOSIS — R0902 Hypoxemia: Secondary | ICD-10-CM

## 2016-02-02 DIAGNOSIS — Z87891 Personal history of nicotine dependence: Secondary | ICD-10-CM | POA: Diagnosis not present

## 2016-02-02 DIAGNOSIS — K5900 Constipation, unspecified: Secondary | ICD-10-CM | POA: Diagnosis present

## 2016-02-02 DIAGNOSIS — G4733 Obstructive sleep apnea (adult) (pediatric): Secondary | ICD-10-CM | POA: Diagnosis present

## 2016-02-02 DIAGNOSIS — I4821 Permanent atrial fibrillation: Secondary | ICD-10-CM | POA: Diagnosis present

## 2016-02-02 DIAGNOSIS — I4891 Unspecified atrial fibrillation: Secondary | ICD-10-CM | POA: Diagnosis present

## 2016-02-02 HISTORY — DX: Pain in right knee: M25.561

## 2016-02-02 HISTORY — DX: Anemia, unspecified: D64.9

## 2016-02-02 HISTORY — DX: Pain in right knee: M25.562

## 2016-02-02 LAB — CBC
HEMATOCRIT: 25.5 % — AB (ref 39.0–52.0)
Hemoglobin: 7.6 g/dL — ABNORMAL LOW (ref 13.0–17.0)
MCH: 17.4 pg — ABNORMAL LOW (ref 26.0–34.0)
MCHC: 29.8 g/dL — ABNORMAL LOW (ref 30.0–36.0)
MCV: 58.5 fL — ABNORMAL LOW (ref 78.0–100.0)
PLATELETS: 227 10*3/uL (ref 150–400)
RBC: 4.36 MIL/uL (ref 4.22–5.81)
RDW: 23.2 % — AB (ref 11.5–15.5)
WBC: 7.6 10*3/uL (ref 4.0–10.5)

## 2016-02-02 LAB — BASIC METABOLIC PANEL
Anion gap: 7 (ref 5–15)
BUN: 12 mg/dL (ref 6–20)
CALCIUM: 8.7 mg/dL — AB (ref 8.9–10.3)
CO2: 29 mmol/L (ref 22–32)
Chloride: 100 mmol/L — ABNORMAL LOW (ref 101–111)
Creatinine, Ser: 0.88 mg/dL (ref 0.61–1.24)
GFR calc Af Amer: >60 mL/min (ref >60)
Glucose, Bld: 81 mg/dL (ref 65–99)
POTASSIUM: 3.3 mmol/L — AB (ref 3.5–5.1)
SODIUM: 136 mmol/L (ref 135–145)

## 2016-02-02 LAB — VITAMIN B12: Vitamin B-12: 457 pg/mL (ref 180–914)

## 2016-02-02 LAB — CBC WITH DIFFERENTIAL/PLATELET
BASOS PCT: 1 %
Basophils Absolute: 0.1 10*3/uL (ref 0.0–0.1)
EOS ABS: 0.1 10*3/uL (ref 0.0–0.7)
Eosinophils Relative: 1 %
HCT: 26.2 % — ABNORMAL LOW (ref 39.0–52.0)
HEMOGLOBIN: 7.3 g/dL — AB (ref 13.0–17.0)
LYMPHS PCT: 12 %
Lymphs Abs: 0.8 10*3/uL (ref 0.7–4.0)
MCH: 17 pg — AB (ref 26.0–34.0)
MCHC: 27.9 g/dL — ABNORMAL LOW (ref 30.0–36.0)
MCV: 60.9 fL — ABNORMAL LOW (ref 78.0–100.0)
Monocytes Absolute: 0.5 10*3/uL (ref 0.1–1.0)
Monocytes Relative: 7 %
NEUTROS PCT: 79 %
Neutro Abs: 5 10*3/uL (ref 1.7–7.7)
Platelets: 196 10*3/uL (ref 150–400)
RBC: 4.3 MIL/uL (ref 4.22–5.81)
RDW: 21.8 % — ABNORMAL HIGH (ref 11.5–15.5)
WBC: 6.5 10*3/uL (ref 4.0–10.5)

## 2016-02-02 LAB — PROTIME-INR
INR: 2.24
PROTHROMBIN TIME: 25.2 s — AB (ref 11.4–15.2)

## 2016-02-02 LAB — I-STAT ARTERIAL BLOOD GAS, ED
ACID-BASE EXCESS: 3 mmol/L — AB (ref 0.0–2.0)
Bicarbonate: 27 mEq/L — ABNORMAL HIGH (ref 20.0–24.0)
O2 SAT: 96 %
TCO2: 28 mmol/L (ref 0–100)
pCO2 arterial: 38.4 mmHg (ref 35.0–45.0)
pH, Arterial: 7.455 — ABNORMAL HIGH (ref 7.350–7.450)
pO2, Arterial: 75 mmHg — ABNORMAL LOW (ref 80.0–100.0)

## 2016-02-02 LAB — I-STAT CG4 LACTIC ACID, ED: Lactic Acid, Venous: 1.24 mmol/L (ref 0.5–1.9)

## 2016-02-02 LAB — IRON AND TIBC
Iron: 33 ug/dL — ABNORMAL LOW (ref 45–182)
SATURATION RATIOS: 7 % — AB (ref 17.9–39.5)
TIBC: 455 ug/dL — AB (ref 250–450)
UIBC: 422 ug/dL

## 2016-02-02 LAB — PROCALCITONIN: Procalcitonin: <0.1 ng/mL

## 2016-02-02 LAB — FOLATE: FOLATE: 11.8 ng/mL (ref >5.9)

## 2016-02-02 LAB — BRAIN NATRIURETIC PEPTIDE: B Natriuretic Peptide: 724.2 pg/mL — ABNORMAL HIGH (ref 0.0–100.0)

## 2016-02-02 LAB — TSH: TSH: 0.672 u[IU]/mL (ref 0.350–4.500)

## 2016-02-02 LAB — MRSA PCR SCREENING: MRSA by PCR: NEGATIVE

## 2016-02-02 LAB — TROPONIN I

## 2016-02-02 LAB — FERRITIN: FERRITIN: 17 ng/mL — AB (ref 24–336)

## 2016-02-02 IMAGING — CR DG CHEST 2V
2 series · 2 of 2 positions shown · non-contrast
Comparison: [DATE]

CLINICAL DATA: Cough, shortness of breath, and chest pressure.

EXAM:
CHEST  2 VIEW

[w chest pa]
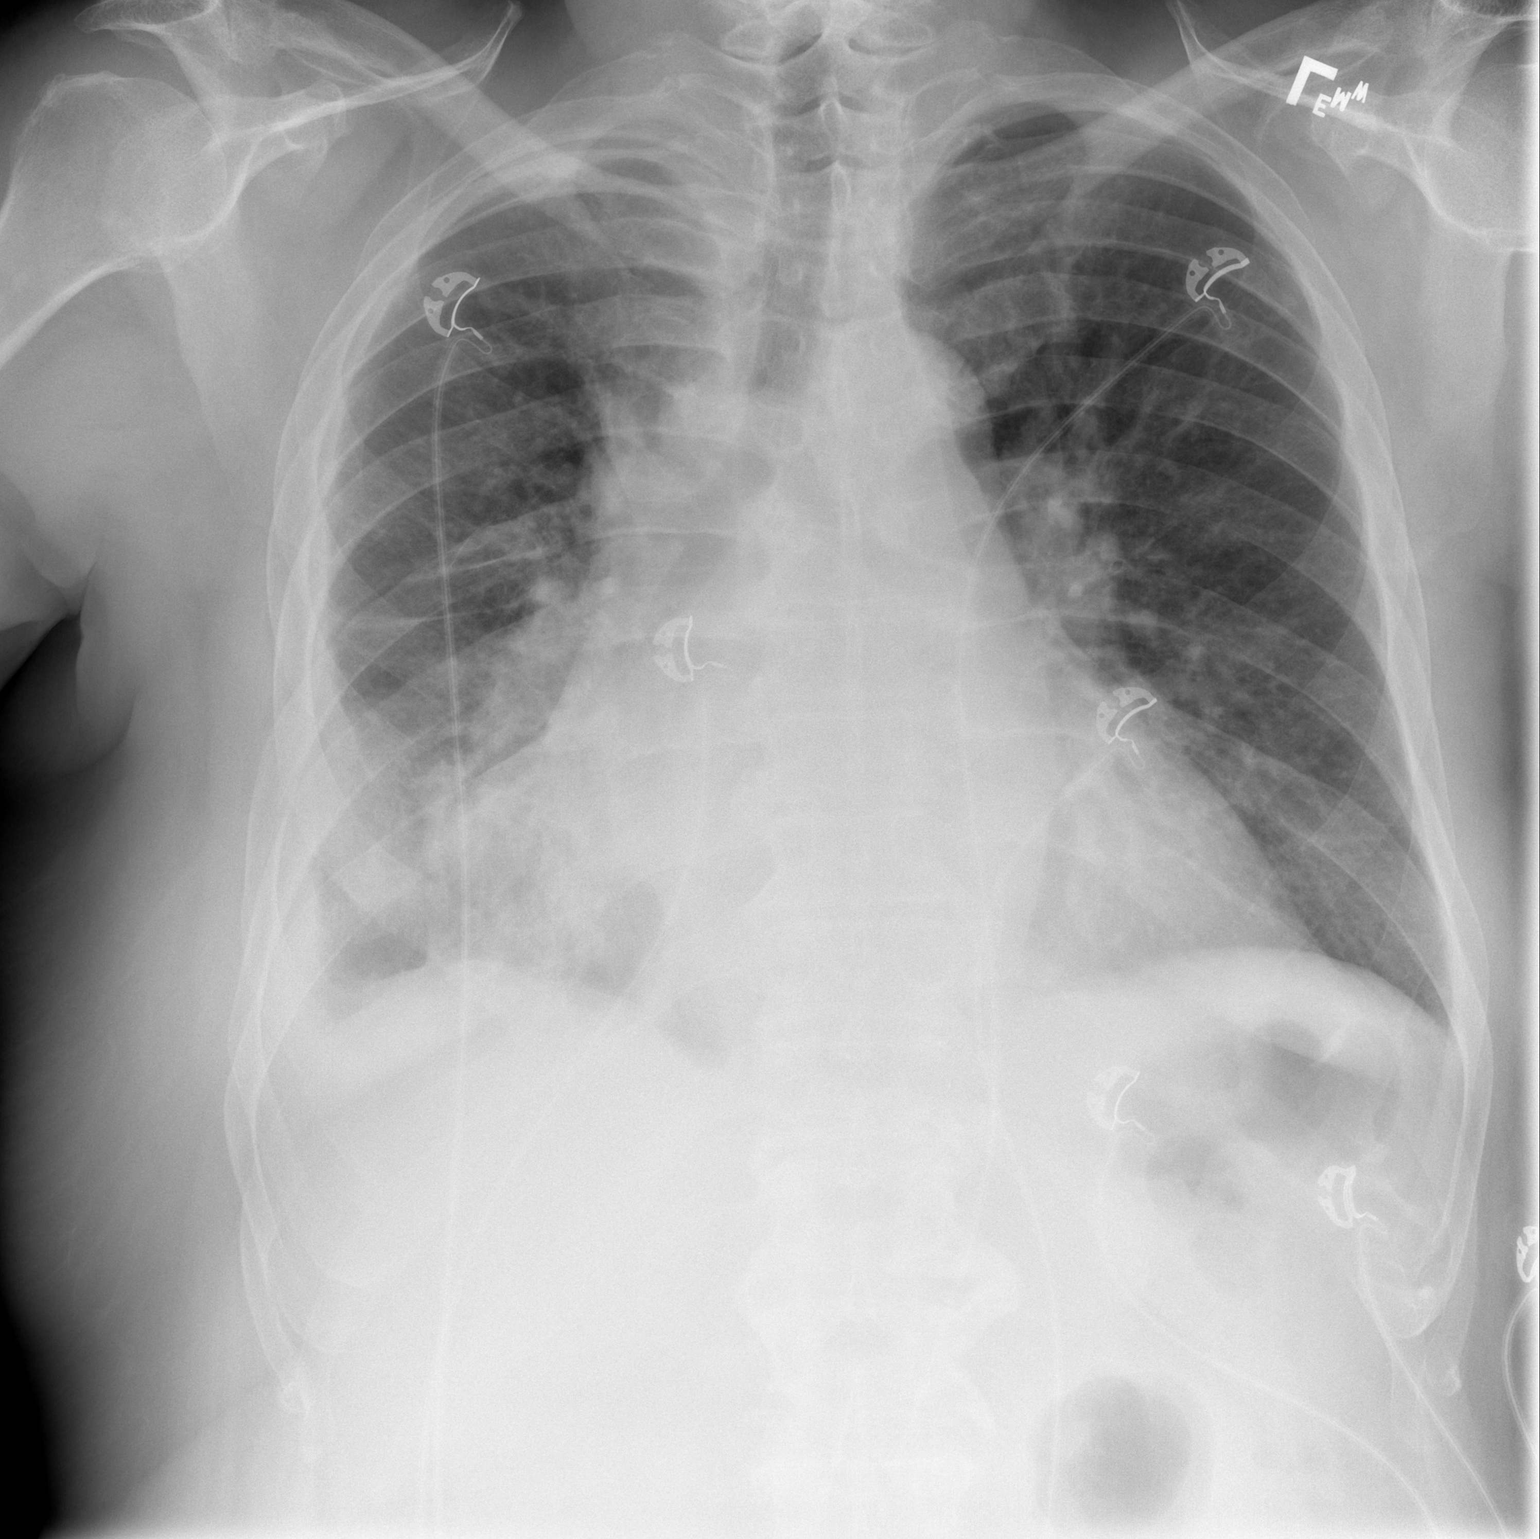

[w chest lat]
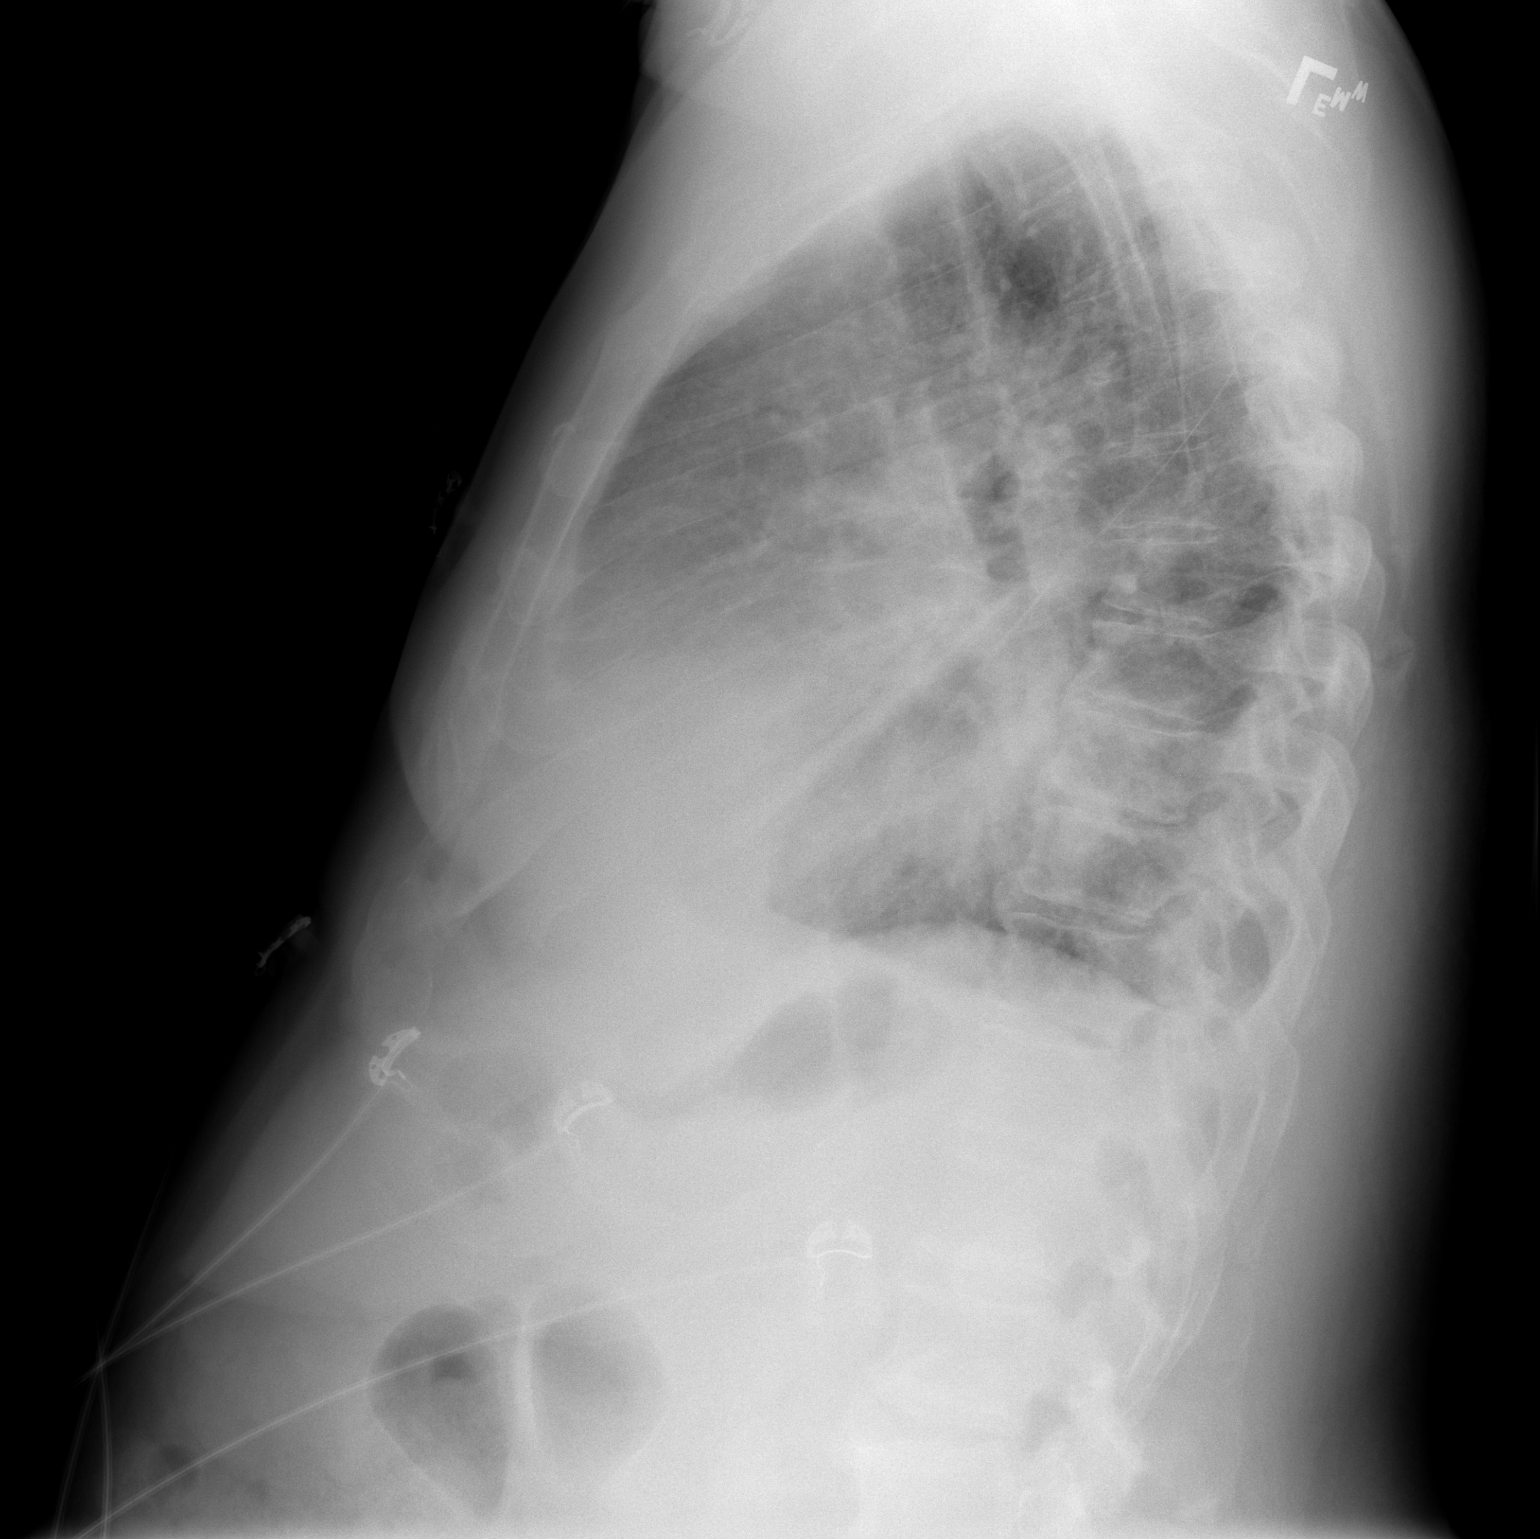

[2 of 2 positions shown; findings below may reference images not displayed]

FINDINGS: The cardiac silhouette remains enlarged. There is worsening airspace
opacity in the right lung base. There is chronic blunting of the
costophrenic angle on the right, however this is slightly more
prominent than on the prior study which may reflect a small pleural
effusion superimposed on chronic pleural thickening. No
pneumothorax. Thoracolumbar spondylosis.
IMPRESSION: Increased right basilar airspace opacity concerning for pneumonia.
Possible small right pleural effusion.

## 2016-02-02 MED ORDER — FUROSEMIDE 10 MG/ML IJ SOLN
80.0000 mg | Freq: Once | INTRAMUSCULAR | Status: AC
  Administered 2016-02-02: 80 mg via INTRAVENOUS
  Filled 2016-02-02: qty 8

## 2016-02-02 MED ORDER — CEFTRIAXONE SODIUM 1 G IJ SOLR
1.0000 g | INTRAMUSCULAR | Status: DC
Start: 2016-02-03 — End: 2016-02-06
  Administered 2016-02-03 – 2016-02-06 (×4): 1 g via INTRAVENOUS
  Filled 2016-02-02 (×4): qty 10

## 2016-02-02 MED ORDER — SODIUM CHLORIDE 0.9% FLUSH: 3.0000 mL | INTRAVENOUS | Status: DC | PRN

## 2016-02-02 MED ORDER — IPRATROPIUM-ALBUTEROL 0.5-2.5 (3) MG/3ML IN SOLN: 3.0000 mL | Freq: Four times a day (QID) | RESPIRATORY_TRACT | Status: DC

## 2016-02-02 MED ORDER — DOCUSATE SODIUM 100 MG PO CAPS
100.0000 mg | ORAL_CAPSULE | Freq: Two times a day (BID) | ORAL | Status: DC
  Administered 2016-02-02 – 2016-02-04 (×4): 100 mg via ORAL
  Filled 2016-02-02 (×4): qty 1

## 2016-02-02 MED ORDER — WARFARIN SODIUM 4 MG PO TABS
8.0000 mg | ORAL_TABLET | Freq: Every day | ORAL | Status: DC
  Administered 2016-02-02 – 2016-02-03 (×2): 8 mg via ORAL
  Filled 2016-02-02 (×2): qty 2

## 2016-02-02 MED ORDER — ASPIRIN EC 81 MG PO TBEC
81.0000 mg | DELAYED_RELEASE_TABLET | Freq: Every day | ORAL | Status: DC
  Administered 2016-02-03 – 2016-02-06 (×4): 81 mg via ORAL
  Filled 2016-02-02 (×4): qty 1

## 2016-02-02 MED ORDER — METOPROLOL TARTRATE 25 MG PO TABS
25.0000 mg | ORAL_TABLET | Freq: Two times a day (BID) | ORAL | Status: DC
  Administered 2016-02-02 – 2016-02-06 (×8): 25 mg via ORAL
  Filled 2016-02-02 (×8): qty 1

## 2016-02-02 MED ORDER — POTASSIUM CHLORIDE CRYS ER 20 MEQ PO TBCR
40.0000 meq | EXTENDED_RELEASE_TABLET | Freq: Every day | ORAL | Status: DC
  Administered 2016-02-03: 40 meq via ORAL
  Filled 2016-02-02: qty 2

## 2016-02-02 MED ORDER — WARFARIN - PHARMACIST DOSING INPATIENT: Freq: Every day | Status: DC

## 2016-02-02 MED ORDER — ASPIRIN 81 MG PO TABS: 81.0000 mg | ORAL_TABLET | Freq: Every day | ORAL | Status: DC

## 2016-02-02 MED ORDER — SODIUM CHLORIDE 0.9 % IV SOLN
INTRAVENOUS | Status: DC
  Administered 2016-02-02: 18:00:00 via INTRAVENOUS

## 2016-02-02 MED ORDER — SODIUM CHLORIDE 0.9 % IV SOLN: 250.0000 mL | INTRAVENOUS | Status: DC | PRN

## 2016-02-02 MED ORDER — DEXTROSE 5 % IV SOLN
1.0000 g | Freq: Once | INTRAVENOUS | Status: AC
  Administered 2016-02-02: 1 g via INTRAVENOUS
  Filled 2016-02-02: qty 10

## 2016-02-02 MED ORDER — ACETAMINOPHEN 325 MG PO TABS
650.0000 mg | ORAL_TABLET | ORAL | Status: DC | PRN
  Administered 2016-02-05 – 2016-02-06 (×3): 650 mg via ORAL
  Filled 2016-02-02 (×3): qty 2

## 2016-02-02 MED ORDER — AZITHROMYCIN 500 MG IV SOLR
INTRAVENOUS | Status: AC
  Administered 2016-02-02: 500 mg
  Filled 2016-02-02: qty 500

## 2016-02-02 MED ORDER — ONDANSETRON HCL 4 MG/2ML IJ SOLN: 4.0000 mg | Freq: Four times a day (QID) | INTRAMUSCULAR | Status: DC | PRN

## 2016-02-02 MED ORDER — SODIUM CHLORIDE 0.9 % IV SOLN: INTRAVENOUS | Status: DC

## 2016-02-02 MED ORDER — FUROSEMIDE 10 MG/ML IJ SOLN
80.0000 mg | Freq: Two times a day (BID) | INTRAMUSCULAR | Status: DC
  Administered 2016-02-02 – 2016-02-05 (×6): 80 mg via INTRAVENOUS
  Filled 2016-02-02 (×6): qty 8

## 2016-02-02 MED ORDER — AZITHROMYCIN 500 MG IV SOLR
500.0000 mg | Freq: Once | INTRAVENOUS | Status: AC
  Administered 2016-02-02: 500 mg via INTRAVENOUS

## 2016-02-02 MED ORDER — SODIUM CHLORIDE 0.9 % IV SOLN
510.0000 mg | Freq: Once | INTRAVENOUS | Status: AC
  Administered 2016-02-02: 510 mg via INTRAVENOUS
  Filled 2016-02-02: qty 17

## 2016-02-02 MED ORDER — DILTIAZEM HCL 30 MG PO TABS: 90.0000 mg | ORAL_TABLET | Freq: Every day | ORAL | Status: DC

## 2016-02-02 MED ORDER — IPRATROPIUM-ALBUTEROL 0.5-2.5 (3) MG/3ML IN SOLN
3.0000 mL | Freq: Four times a day (QID) | RESPIRATORY_TRACT | Status: DC
  Administered 2016-02-02 – 2016-02-03 (×4): 3 mL via RESPIRATORY_TRACT
  Filled 2016-02-02 (×4): qty 3

## 2016-02-02 MED ORDER — SODIUM CHLORIDE 0.9% FLUSH
3.0000 mL | Freq: Two times a day (BID) | INTRAVENOUS | Status: DC
  Administered 2016-02-02 – 2016-02-06 (×8): 3 mL via INTRAVENOUS

## 2016-02-02 MED ORDER — HYDRALAZINE HCL 50 MG PO TABS
50.0000 mg | ORAL_TABLET | Freq: Two times a day (BID) | ORAL | Status: DC
  Administered 2016-02-02 – 2016-02-06 (×8): 50 mg via ORAL
  Filled 2016-02-02 (×8): qty 1

## 2016-02-02 MED ORDER — AZITHROMYCIN 500 MG PO TABS
500.0000 mg | ORAL_TABLET | ORAL | Status: DC
  Administered 2016-02-03 – 2016-02-06 (×4): 500 mg via ORAL
  Filled 2016-02-02 (×4): qty 1

## 2016-02-02 NOTE — Progress Notes (Signed)
Pt coming from [facility] for acute respiratory failure from CAP and mild CHF exacerbation with worsening anemia hemoglobin 7.6. Patient accepted to stepdown bed as he continues to be hypoxic and tachypneic.  Linna Darner, MD [facility] 02/02/2016, 1:44 PM

## 2016-02-02 NOTE — ED Notes (Signed)
Left Radial Artery x 1 attempt for ABG. Pt on 2lpm [place]. No complications noted. Held 5 mins till bleeding stopped. Temp 98.8

## 2016-02-02 NOTE — H&P (Addendum)
History and Physical    Steven Barnett [id number] DOB: 07-04-1951 DOA: 02/02/2016  Referring MD/NP/PA: Fredia Sorrow PCP: Elisabeth Cara, PA-C  Outpatient Specialists: none  Patient coming from: home  Chief Complaint: SOB and cough  HPI: Steven Barnett is a 65 y.o. 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  Since that time, he has adhered to a healthy heart, low salt diet and has lost 50-lbs.  At that time, he was also anemic with a hemoglobin near 9mg /dl and work up revealed iron deficiency anemia.  He was referred to GI and started on iron, however, he has not followed up for colonoscopy even though he has a family history of malignant polyps.    A month ago, his lasix dose was decreased from 80mg  BID to 40mg  BID.  For the last week, he has had progressive cough with SOB and orthopnea.  He denies fevers, chills, nausea.  He denies lower extremity swelling, abdominal distension or bloating, however, he suspected that he was volume-overloaded and took an additional dose of lasix yesterday which caused him to void much more.  Despite increasing his lasix, taking cough syrup and halls, he became more Lavone Barrientes of breath and presented to [facility] in respiratory distress, hypoxic.  Over the last 24 hours, he has developed thick yellow sputum.    He endorses early fatigue, DOE, feeling cold, and pica (ice and sodium bicarbonate).  He denies obvious blood loss, black tarry stools, blood in stools.    In the ER, His CXR demonstrated a right lower lobe opacity suggestive of infection, however, his WBC is normal and he was afebrile.  This opacity and effusion are in the area where his scar tissue is.  His BNP was 724.   Hemoglobin of 7.6 mg/dl.    Review of Systems:  General:  Denies fevers, chills.  50-lb weight loss HEENT:  Denies changes to hearing and vision, rhinorrhea, sinus congestion, sore throat CV:  Denies chest pain and palpitations, lower extremity edema.  PULM:  Per HPI GI:  Denies nausea, vomiting, constipation, diarrhea.   GU:  Denies dysuria, frequency, urgency ENDO:  Denies polyuria, polydipsia.   HEME:  Denies hematemesis, blood in stools, melena, abnormal bruising or bleeding.  LYMPH:  Denies lymphadenopathy.   MSK:  Denies arthralgias, myalgias.   DERM:  Denies skin rash or ulcer.   NEURO:  Denies focal numbness, weakness, slurred speech, confusion, facial droop.  PSYCH:  Denies anxiety and depression.    Past Medical History:  Diagnosis Date  . Anemia   . Arthralgia of both knees   . Arthritis    "right knee" (10/01/2015)  . Chronic anticoagulation 2010   Coumadin  . Chronic diastolic CHF (congestive heart failure), NYHA class 2 ([place])   . Hypertension   . OSA (obstructive sleep apnea) 2016   "couldn't take the mask during the testing" (10/01/2015)  . Permanent atrial fibrillation ([place]) 2010  . Pneumonia 09/30/2015    Past Surgical History:  Procedure Laterality Date  . THORACOTOMY Right 2010     reports that he quit smoking about 41 years ago. His smoking use included Cigarettes. He smoked 0.10 packs per day. He has never used smokeless tobacco. He reports that he drinks alcohol. He reports  that he uses drugs, including Cocaine and Marijuana.  No Known Allergies  Family History  Problem Relation Age of Onset  . Heart disease Father   . Heart failure Father   . Hypertension Mother   . Diabetes Mother   . Hyperlipidemia Mother   . Stroke Maternal Grandmother   . Stroke Sister   . Heart attack Neg Hx     Prior to Admission medications   Medication Sig Start Date End Date Taking? Authorizing Provider  albuterol (PROVENTIL HFA;VENTOLIN HFA) 108 (90 Base) MCG/ACT  inhaler Inhale 2 puffs into the lungs every 6 (six) hours as needed for wheezing or shortness of breath. 10/01/15   Nita Sells, MD  aspirin 81 MG tablet Take 81 mg by mouth daily.    Historical Provider, MD  diltiazem (CARDIZEM) 90 MG tablet Take 1 tablet (90 mg total) by mouth daily. 10/02/15   Nita Sells, MD  docusate sodium (COLACE) 100 MG capsule Take 1 capsule (100 mg total) by mouth 2 (two) times daily. For constipation 07/12/15   Ripudeep Krystal Eaton, MD  furosemide (LASIX) 80 MG tablet TAKE ONE TABLET BY MOUTH TWICE DAILY 01/19/16   Pixie Casino, MD  hydrALAZINE (APRESOLINE) 50 MG tablet TAKE ONE TABLET BY MOUTH TWICE DAILY 09/03/15   Pixie Casino, MD  ipratropium (ATROVENT HFA) 17 MCG/ACT inhaler Inhale 2 puffs into the lungs as needed for wheezing.    Historical Provider, MD  lisinopril (PRINIVIL,ZESTRIL) 20 MG tablet TAKE ONE TABLET BY MOUTH ONCE DAILY 04/20/15   Pixie Casino, MD  metoprolol tartrate (LOPRESSOR) 25 MG tablet Take 1 tablet (25 mg total) by mouth 2 (two) times daily. 12/03/15   Pixie Casino, MD  potassium chloride SA (K-DUR,KLOR-CON) 20 MEQ tablet Take 2 tablets (40 mEq total) by mouth daily. 12/16/15   Pixie Casino, MD  sildenafil (REVATIO) 20 MG tablet TAKE TWO TO THREE TABLETS BY MOUTH ONCE DAILY AS NEEDED FOR  SEXUAL  ACTIVITY 10/29/15   Pixie Casino, MD  warfarin (COUMADIN) 4 MG tablet TAKE TWO TABLETS BY MOUTH ONCE DAILY OR AS DIRECTED BY THE COUMADIN CLINIC 01/15/16   Pixie Casino, MD  warfarin (COUMADIN) 4 MG tablet Take 2 tablets by mouth daily or as directed by coumadin clinic  Take Coumadin total 12mg  tonight 08/08/15. 01/15/16   Pixie Casino, MD    Physical Exam: Vitals:   02/02/16 1500 02/02/16 1508 02/02/16 1509 02/02/16 1613  BP: 115/87  118/87 111/81  Pulse: 85 63  (!) 58  Resp: 22 25  (!) 23  Temp:    97.7 F (36.5 C)  TempSrc:    Oral  SpO2: 92% 100%  97%  Weight:    117.4 kg (258 lb 12.8 oz)  Height:    5\' 11"  (1.803 m)       Constitutional: Adult male, initially somnolent with eyes rolling back in head and unable to stay awake for questions, but after a few minutes, he became more alert and was able to provide a history. Eyes: PERRL, lids and conjunctivae normal ENMT: Mucous membranes are moist. Posterior pharynx clear of any exudate or lesions.Normal dentition.  Neck: normal, supple, no masses, no thyromegaly Respiratory:   Very diminished with egophany in the RLL and right mid-back.  Scattered rales on the right side, faint rales at the left base, no obvious wheeze, but he has a wheezy cough.  + rhonchi Cardiovascular:  IRRR and bradycardic to the 40s.  No extremity  edema. 2+ pedal pulses. No carotid bruits.  Abdomen: no tenderness, no masses palpated. No hepatosplenomegaly. Bowel sounds positive.  Musculoskeletal: no clubbing / cyanosis. No joint deformity upper and lower extremities. Good ROM, no contractures. Normal muscle tone.  Skin: no rashes, lesions, ulcers. No induration Neurologic: CN 2-12 grossly intact. Sensation intact, DTR normal. Strength 5/5 in all 4.  Psychiatric: Normal judgment and insight. Alert and oriented x 3. Normal mood.   Labs on Admission: I have personally reviewed following labs and imaging studies  CBC:  Recent Labs Lab 02/02/16 1215 02/02/16 1728  WBC 7.6 6.5  NEUTROABS  --  PENDING  HGB 7.6* 7.3*  HCT 25.5* 26.2*  MCV 58.5* 60.9*  PLT 227 123456   Basic Metabolic Panel:  Recent Labs Lab 02/02/16 1215  NA 136  K 3.3*  CL 100*  CO2 29  GLUCOSE 81  BUN 12  CREATININE 0.88  CALCIUM 8.7*   GFR: Estimated Creatinine Clearance: 110.5 mL/min (by C-G formula based on SCr of 0.88 mg/dL). Liver Function Tests: No results for input(s): AST, ALT, ALKPHOS, BILITOT, PROT, ALBUMIN in the last 168 hours. No results for input(s): LIPASE, AMYLASE in the last 168 hours. No results for input(s): AMMONIA in the last 168 hours. Coagulation Profile:  Recent Labs Lab  02/02/16 1215  INR 2.24   Cardiac Enzymes:  Recent Labs Lab 02/02/16 1215  TROPONINI <0.03    ) Recent Results (from the past 240 hour(s))  MRSA PCR Screening     Status: None   Collection Time: 02/02/16  5:15 PM  Result Value Ref Range Status   MRSA by PCR NEGATIVE NEGATIVE Final    Comment:        The GeneXpert MRSA Assay (FDA approved for NASAL specimens only), is one component of a comprehensive MRSA colonization surveillance program. It is not intended to diagnose MRSA infection nor to guide or monitor treatment for MRSA infections.      Radiological Exams on Admission: Dg Chest 2 View  Result Date: 02/02/2016 CLINICAL DATA:  Cough, shortness of breath, and chest pressure. EXAM: CHEST  2 VIEW COMPARISON:  09/30/2015 FINDINGS: The cardiac silhouette remains enlarged. There is worsening airspace opacity in the right lung base. There is chronic blunting of the costophrenic angle on the right, however this is slightly more prominent than on the prior study which may reflect a small pleural effusion superimposed on chronic pleural thickening. No pneumothorax. Thoracolumbar spondylosis. IMPRESSION: Increased right basilar airspace opacity concerning for pneumonia. Possible small right pleural effusion. Electronically Signed   By: Logan Bores M.D.   On: 02/02/2016 13:01    EKG: Independently reviewed. Atrial fibrillation with inverted T-waves in the lateral leads, similar to ECGs from march of this year.  No ST-segment elevation.    Assessment/Plan Principal Problem:   Acute on chronic diastolic heart failure (HCC) Active Problems:   Morbid obesity ([place])   Essential hypertension, benign   Permanent atrial fibrillation (HCC)   Acute on chronic diastolic (congestive) heart failure (HCC)   Chronic anticoagulation - Coumadin, CHADS2VASC=2   CAP (community acquired pneumonia)   Acute respiratory failure with hypoxia ([place])  Acute hypoxic respiratory failure secondary to  possible community acquired pneumonia versus acute on chronic diastolic heart failure.  EF was 55-60% on ECHO in 07/2015.  Lack of fever and leukocytosis suggest this is primarily heart failure related, however, he has very diminished breath sounds at the right base and a productive cough.  -  Stepdown  -  No need for bipap currently -  procalcitonin -  F/u blood cultures -  Continue ceftriaxone and azithromycin -  Sputum culture if able -  Urine S. pneumo -  HIV  -  Repeat CXR in AM -  If right lower lobe abnormalities persist, consider CT chest to better characterize effusion -  Strict I/O -  Daily weights:  Reported dry weight is near 250-lbs, currently 258-lbs -  Lasix 80mg  IV BID -  No need to repeat ECHO at this time  Iron deficiency anemia, progressive.  Labs from January suggested iron deficiency.  Given his weight loss (even despite his dietary changes) and his strong family history of colon polyps, including malignant polyps, I worry that he may have colon cancer.  Recent SPEP was negative for M-spike.   -  Strongly encouraged GI follow up.  Not a good candidate for inpatient colonoscopy due to respiratory distress -  Repeat iron studies, b12, folate, TSH -  Consider transfusion once his respiratory status improves if hemoglobin remains low or declines -  Feraheme infusion ordered  Hypertension, blood pressure stable to mildly elevated -  Holding dilt and ACEI given severity of illness -  Continue metoprolol  Permanent atrial fibrillation with bradycardia.  CHADs2vasc = 1 for HTN.  On chronic anticoagulation -  Continue warfarin per pharmacy -  Hold diltiazem -  Hold parameter placed for metoprolol  OSA, recommend sleep study as outpatient and initiation of CPAP if indicated  Morbid obesity, BMI 36.1.  Already rapidly losing weight as outpatient.  Did not provide any additional counseling.    DVT prophylaxis:  warfarin Code Status: full Family Communication: patient  alone  Disposition Plan: pending improvement in dyspnea  Consults called: none  Admission status: inpatient, stepdown   Janece Canterbury MD [facility] Pager [phone]  If 7PM-7AM, please contact night-coverage [email or web address] Password [facility]  02/02/2016, 6:46 PM

## 2016-02-02 NOTE — ED Triage Notes (Signed)
Sob and left sided chest pain since this am. States it started with a cough 3 days ago. No relief with otc cough meds. Yellow sputum.

## 2016-02-02 NOTE — Progress Notes (Signed)
ANTICOAGULATION CONSULT NOTE - Initial Consult  Pharmacy Consult for Warfarin Indication: atrial fibrillation  No Known Allergies  Patient Measurements: Height: 5\' 11"  (180.3 cm) Weight: 258 lb 12.8 oz (117.4 kg) IBW/kg (Calculated) : 75.3  Vital Signs: Temp: 97.7 F (36.5 C) (08/01 1613) Temp Source: Oral (08/01 1613) BP: 111/81 (08/01 1613) Pulse Rate: 58 (08/01 1613)  Labs:  Recent Labs  02/02/16 1215  HGB 7.6*  HCT 25.5*  PLT 227  LABPROT 25.2*  INR 2.24  CREATININE 0.88  TROPONINI <0.03    Estimated Creatinine Clearance: 110.5 mL/min (by C-G formula based on SCr of 0.88 mg/dL).   Medical History: Past Medical History:  Diagnosis Date  . Arthritis    "right knee" (10/01/2015)  . Chronic anticoagulation 2010   Coumadin  . Chronic diastolic CHF (congestive heart failure), NYHA class 2 ([place])   . Hypertension   . OSA (obstructive sleep apnea) 2016   "couldn't take the mask during the testing" (10/01/2015)  . Permanent atrial fibrillation ([place]) 2010  . Pneumonia 09/30/2015    Medications:  Scheduled:  . [START ON 02/03/2016] aspirin EC  81 mg Oral Daily  . [START ON 02/03/2016] azithromycin  500 mg Oral Q24H  . [START ON 02/03/2016] cefTRIAXone (ROCEPHIN)  IV  1 g Intravenous Q24H  . [START ON 02/03/2016] diltiazem  90 mg Oral Daily  . docusate sodium  100 mg Oral BID  . ferumoxytol  510 mg Intravenous Once  . furosemide  80 mg Intravenous Q12H  . hydrALAZINE  50 mg Oral BID  . ipratropium-albuterol  3 mL Nebulization QID  . metoprolol tartrate  25 mg Oral BID  . [START ON 02/03/2016] potassium chloride SA  40 mEq Oral Daily  . sodium chloride flush  3 mL Intravenous Q12H    Assessment: 65 year old male with chronic atrial fibrillation on warfarin prior to admission now to resume inpatient per pharmacy dosing consult. Home regimen was 8mg  daily and last dose was on 02/01/16. INR today is 2.24. Hemoglobin is low - 7.6 >>7.3, Plts 227 >>196. Patient was previously  diagnosed anemia being worked up by GI for [place]. No bleeding reported. Note patient has initiated on azithromycin and ceftriaxone- will monitor for interaction.   Goal of Therapy:  INR 2-3 Monitor platelets by anticoagulation protocol: Yes   Plan:  Continue Warfarin 8mg  po daily. Daily PT/INR  Sloan Leiter, PharmD, BCPS Clinical Pharmacist [phone] 02/02/2016,5:07 PM

## 2016-02-02 NOTE — ED Provider Notes (Addendum)
[facility] Provider Note   CSN: [id number] Arrival date & time: 02/02/16  1156  First Provider Contact:  First MD Initiated Contact with Patient 02/02/16 1202        History   Chief Complaint Chief Complaint  Patient presents with  . Shortness of Breath    HPI Quinell Siwicki is a 65 y.o. male.  Patient with shortness of breath for several days but got acutely worse this morning. Patient with a known history of congestive heart failure. Also known history of chronic atrial fibrillation on chronic anticoagulation Coumadin. Patient states that he's had the fairly persistent cough to quit for the past 2 days and is been productive of yellow sputum. Denies fevers. Patient is normally admitted for congestive heart failure. Patient arrived hypoxic on room air oxygen saturations in the upper 80s. On 2 L of oxygen saturations came up to the upper 90s and patient felt better.   The history is provided by the patient and a relative.  Shortness of Breath  Associated symptoms include cough, chest pain and leg swelling. Pertinent negatives include no fever, no vomiting, no abdominal pain and no rash.   Patient related some chest pain overnight but has not had any this morning and none currently. Past Medical History:  Diagnosis Date  . Arthritis    "right knee" (10/01/2015)  . Chronic anticoagulation 2010   Coumadin  . Chronic diastolic CHF (congestive heart failure), NYHA class 2 ([place])   . Hypertension   . OSA (obstructive sleep apnea) 2016   "couldn't take the mask during the testing" (10/01/2015)  . Permanent atrial fibrillation ([place]) 2010  . Pneumonia 09/30/2015    Patient Active Problem List   Diagnosis Date Noted  . CAP (community acquired pneumonia) 02/02/2016  . Long-term (current) use of anticoagulants 12/03/2015  . Pneumonia 09/30/2015  . PNA (pneumonia) 09/30/2015  . Chronic anticoagulation - Coumadin, CHADS2VASC=2 08/05/2015  . Acute on chronic diastolic CHF  (congestive heart failure), NYHA class 4 ([place]) 08/05/2015  . Acute on chronic congestive heart failure ([place])   . CHF exacerbation ([place]) 07/08/2015  . Community acquired pneumonia 02/10/2015  . ED (erectile dysfunction) 08/12/2014  . Arthralgia of both knees 04/08/2014  . Acute on chronic diastolic (congestive) heart failure ([place]) 01/14/2009  . Morbid obesity ([place]) 12/31/2008  . Obstructive sleep apnea 12/31/2008  . Essential hypertension, benign 12/31/2008  . Permanent atrial fibrillation ([place]) 12/31/2008  . DIASTOLIC HEART FAILURE, CHRONIC 12/31/2008    Past Surgical History:  Procedure Laterality Date  . THORACOTOMY Right 2010       Home Medications    Prior to Admission medications   Medication Sig Start Date End Date Taking? Authorizing Provider  albuterol (PROVENTIL HFA;VENTOLIN HFA) 108 (90 Base) MCG/ACT inhaler Inhale 2 puffs into the lungs every 6 (six) hours as needed for wheezing or shortness of breath. 10/01/15   Nita Sells, MD  aspirin 81 MG tablet Take 81 mg by mouth daily.    Historical Provider, MD  diltiazem (CARDIZEM) 90 MG tablet Take 1 tablet (90 mg total) by mouth daily. 10/02/15   Nita Sells, MD  docusate sodium (COLACE) 100 MG capsule Take 1 capsule (100 mg total) by mouth 2 (two) times daily. For constipation 07/12/15   Ripudeep Krystal Eaton, MD  furosemide (LASIX) 80 MG tablet TAKE ONE TABLET BY MOUTH TWICE DAILY 01/19/16   Pixie Casino, MD  hydrALAZINE (APRESOLINE) 50 MG tablet TAKE ONE TABLET BY MOUTH TWICE DAILY 09/03/15   Chrissie Noa  C Hilty, MD  ipratropium (ATROVENT HFA) 17 MCG/ACT inhaler Inhale 2 puffs into the lungs as needed for wheezing.    Historical Provider, MD  levofloxacin (LEVAQUIN) 750 MG tablet Take 1 tablet (750 mg total) by mouth daily. 10/01/15   Nita Sells, MD  lisinopril (PRINIVIL,ZESTRIL) 20 MG tablet TAKE ONE TABLET BY MOUTH ONCE DAILY 04/20/15   Pixie Casino, MD  metoprolol tartrate (LOPRESSOR) 25 MG tablet Take 1  tablet (25 mg total) by mouth 2 (two) times daily. 12/03/15   Pixie Casino, MD  potassium chloride SA (K-DUR,KLOR-CON) 20 MEQ tablet Take 2 tablets (40 mEq total) by mouth daily. 12/16/15   Pixie Casino, MD  predniSONE (DELTASONE) 20 MG tablet Take 2 tablets for 2 days (4/7 and 4/8). Decrease to 1 tablet daily for 2 days (4/9 and 4/10). Then STOP    Historical Provider, MD  sildenafil (REVATIO) 20 MG tablet TAKE TWO TO THREE TABLETS BY MOUTH ONCE DAILY AS NEEDED FOR  SEXUAL  ACTIVITY 10/29/15   Pixie Casino, MD  warfarin (COUMADIN) 4 MG tablet TAKE TWO TABLETS BY MOUTH ONCE DAILY OR AS DIRECTED BY THE COUMADIN CLINIC 01/15/16   Pixie Casino, MD  warfarin (COUMADIN) 4 MG tablet Take 2 tablets by mouth daily or as directed by coumadin clinic  Take Coumadin total 12mg  tonight 08/08/15. 01/15/16   Pixie Casino, MD    Family History Family History  Problem Relation Age of Onset  . Heart disease Father   . Heart failure Father   . Hypertension Mother   . Diabetes Mother   . Hyperlipidemia Mother   . Stroke Maternal Grandmother   . Stroke Sister   . Heart attack Neg Hx     Social History Social History  Substance Use Topics  . Smoking status: Former Smoker    Packs/day: 0.10    Types: Cigarettes    Quit date: 12/16/1974  . Smokeless tobacco: Never Used     Comment: "1 pack cigarettes would last me a month"  . Alcohol use 0.0 oz/week     Comment: 10/01/2015 "might have1 6 pack of beer/year"     Allergies   Review of patient's allergies indicates no known allergies.   Review of Systems Review of Systems  Constitutional: Positive for fatigue. Negative for fever.  HENT: Positive for congestion.   Eyes: Negative for redness.  Respiratory: Positive for cough and shortness of breath.   Cardiovascular: Positive for chest pain and leg swelling.  Gastrointestinal: Negative for abdominal pain, blood in stool, diarrhea, nausea and vomiting.  Genitourinary: Negative for dysuria.    Musculoskeletal: Negative for myalgias.  Skin: Negative for rash.  Hematological: Bruises/bleeds easily.  Psychiatric/Behavioral: Negative for confusion.     Physical Exam Updated Vital Signs BP (!) 139/103   Pulse 72   Temp 98.2 F (36.8 C) (Oral)   Resp (!) 27   Ht 5\' 11"  (1.803 m)   Wt 117.9 kg   SpO2 98%   BMI 36.26 kg/m   Physical Exam  Constitutional: He is oriented to person, place, and time. He appears well-developed and well-nourished. He appears distressed.  HENT:  Head: Normocephalic and atraumatic.  Eyes: EOM are normal. Pupils are equal, round, and reactive to light.  Neck: Normal range of motion. Neck supple.  Cardiovascular: Normal rate and normal heart sounds.   Irregular  Pulmonary/Chest: He is in respiratory distress. He has no wheezes. He has rales. He exhibits no tenderness.  Abdominal: Soft.  Bowel sounds are normal. There is tenderness.  Musculoskeletal: He exhibits edema.  Neurological: He is alert and oriented to person, place, and time. No cranial nerve deficit. He exhibits normal muscle tone. Coordination normal.  Drowsy  Skin: Skin is warm.  Nursing note and vitals reviewed.    ED Treatments / Results  Labs (all labs ordered are listed, but only abnormal results are displayed) Labs Reviewed  BASIC METABOLIC PANEL - Abnormal; Notable for the following:       Result Value   Potassium 3.3 (*)    Chloride 100 (*)    Calcium 8.7 (*)    All other components within normal limits  CBC - Abnormal; Notable for the following:    Hemoglobin 7.6 (*)    HCT 25.5 (*)    MCV 58.5 (*)    MCH 17.4 (*)    MCHC 29.8 (*)    RDW 23.2 (*)    All other components within normal limits  PROTIME-INR - Abnormal; Notable for the following:    Prothrombin Time 25.2 (*)    All other components within normal limits  BRAIN NATRIURETIC PEPTIDE - Abnormal; Notable for the following:    B Natriuretic Peptide 724.2 (*)    All other components within normal limits   I-STAT ARTERIAL BLOOD GAS, ED - Abnormal; Notable for the following:    pH, Arterial 7.455 (*)    pO2, Arterial 75.0 (*)    Bicarbonate 27.0 (*)    Acid-Base Excess 3.0 (*)    All other components within normal limits  CULTURE, BLOOD (ROUTINE X 2)  CULTURE, BLOOD (ROUTINE X 2)  TROPONIN I  I-STAT CG4 LACTIC ACID, ED   Results for orders placed or performed during the hospital encounter of [id number]  Basic metabolic panel  Result Value Ref Range   Sodium 136 135 - 145 mmol/L   Potassium 3.3 (L) 3.5 - 5.1 mmol/L   Chloride 100 (L) 101 - 111 mmol/L   CO2 29 22 - 32 mmol/L   Glucose, Bld 81 65 - 99 mg/dL   BUN 12 6 - 20 mg/dL   Creatinine, Ser 0.88 0.61 - 1.24 mg/dL   Calcium 8.7 (L) 8.9 - 10.3 mg/dL   GFR calc non Af Amer >60 >60 mL/min   GFR calc Af Amer >60 >60 mL/min   Anion gap 7 5 - 15  CBC  Result Value Ref Range   WBC 7.6 4.0 - 10.5 K/uL   RBC 4.36 4.22 - 5.81 MIL/uL   Hemoglobin 7.6 (L) 13.0 - 17.0 g/dL   HCT 25.5 (L) 39.0 - 52.0 %   MCV 58.5 (L) 78.0 - 100.0 fL   MCH 17.4 (L) 26.0 - 34.0 pg   MCHC 29.8 (L) 30.0 - 36.0 g/dL   RDW 23.2 (H) 11.5 - 15.5 %   Platelets 227 150 - 400 K/uL  Troponin I  Result Value Ref Range   Troponin I <0.03 <0.03 ng/mL  Protime-INR (order if Patient is taking Coumadin / Warfarin)  Result Value Ref Range   Prothrombin Time 25.2 (H) 11.4 - 15.2 seconds   INR 2.24   Brain natriuretic peptide  Result Value Ref Range   B Natriuretic Peptide 724.2 (H) 0.0 - 100.0 pg/mL  I-Stat Arterial Blood Gas, ED - (order at [facility] and [facility] only)  Result Value Ref Range   pH, Arterial 7.455 (H) 7.350 - 7.450   pCO2 arterial 38.4 35.0 - 45.0 mmHg   pO2, Arterial 75.0 (L) 80.0 -  100.0 mmHg   Bicarbonate 27.0 (H) 20.0 - 24.0 mEq/L   TCO2 28 0 - 100 mmol/L   O2 Saturation 96.0 %   Acid-Base Excess 3.0 (H) 0.0 - 2.0 mmol/L   Patient temperature 98.8 F    Collection site RADIAL, ALLEN'S TEST ACCEPTABLE    Drawn by RT    Sample type ARTERIAL   I-Stat CG4  Lactic Acid, ED  Result Value Ref Range   Lactic Acid, Venous 1.24 0.5 - 1.9 mmol/L     EKG  EKG Interpretation  Date/Time:  Tuesday February 02 2016 12:07:35 EDT Ventricular Rate:  68 PR Interval:    QRS Duration: 101 QT Interval:  567 QTC Calculation: 604 R Axis:   -23 Text Interpretation:  Atrial fibrillation Borderline left axis deviation Borderline T abnormalities, inferior leads Prolonged QT interval Baseline wander in lead(s) V3 V5 Confirmed by [place] Lebeau  MD, Ange Puskas ([id number]) on 02/02/2016 12:11:45 PM       Radiology Dg Chest 2 View  Result Date: 02/02/2016 CLINICAL DATA:  Cough, shortness of breath, and chest pressure. EXAM: CHEST  2 VIEW COMPARISON:  09/30/2015 FINDINGS: The cardiac silhouette remains enlarged. There is worsening airspace opacity in the right lung base. There is chronic blunting of the costophrenic angle on the right, however this is slightly more prominent than on the prior study which may reflect a small pleural effusion superimposed on chronic pleural thickening. No pneumothorax. Thoracolumbar spondylosis. IMPRESSION: Increased right basilar airspace opacity concerning for pneumonia. Possible small right pleural effusion. Electronically Signed   By: Logan Bores M.D.   On: 02/02/2016 13:01    Procedures Procedures (including critical care time)  Medications Ordered in ED Medications  0.9 %  sodium chloride infusion ( Intravenous Hold 02/02/16 1221)  cefTRIAXone (ROCEPHIN) 1 g in dextrose 5 % 50 mL IVPB (1 g Intravenous New Bag/Given 02/02/16 1337)  azithromycin (ZITHROMAX) 500 mg in dextrose 5 % 250 mL IVPB (not administered)  azithromycin (ZITHROMAX) 500 MG injection (not administered)  furosemide (LASIX) injection 80 mg (not administered)     Initial Impression / Assessment and Plan / ED Course  I have reviewed the triage vital signs and the nursing notes.  Pertinent labs & imaging results that were available during my care of the patient were reviewed by  me and considered in my medical decision making (see chart for details).  Clinical Course    Patient with shortness of breath for several days get acutely worse this morning. Has had a productive cough of yellow phlegm. Chest x-ray consistent with pneumonia. This be community-acquired pneumonia. Patient last admitted the end of March. Patient had hypoxia room air sats of were below 90. On 2 L patient satting in upper 90s. Patient states breathing feels better but overall he is very drowsy. Arterial blood gas showed no evidence of any carbon dioxide retention. Blood gas overall was much better than baseline for him. Patient has a history of fairly significant CHF. BNP is elevated patient will be given 80 mg of Lasix.  Patient will be admitted to the stepdown unit.  Additional findings included fairly significant anemia may require transfusion. Patient started on Rocephin and Zithromax for the community-acquired pneumonia. Also stated patient received 80 mg of Lasix. Patient's vital signs not consistent with sepsis not febrile spray rate was up blood pressure not down heart rate not up.  Blood cultures done as well as lactic acid pending.  Patient denies any blood in his bowel movements or bowel movements being  black. Patient has a history of chronic atrial fib rate currently controlled. In addition his INR is therapeutic.  Final Clinical Impressions(s) / ED Diagnoses   Final diagnoses:  CAP (community acquired pneumonia)  Hypoxia  Anemia, unspecified anemia type    New Prescriptions New Prescriptions   No medications on file     Fredia Sorrow, MD 02/02/16 [place], MD 02/02/16 1359

## 2016-02-03 ENCOUNTER — Inpatient Hospital Stay (HOSPITAL_COMMUNITY): Payer: Medicaid Other

## 2016-02-03 DIAGNOSIS — J9601 Acute respiratory failure with hypoxia: Secondary | ICD-10-CM

## 2016-02-03 DIAGNOSIS — I482 Chronic atrial fibrillation: Secondary | ICD-10-CM

## 2016-02-03 DIAGNOSIS — I10 Essential (primary) hypertension: Secondary | ICD-10-CM

## 2016-02-03 DIAGNOSIS — Z7901 Long term (current) use of anticoagulants: Secondary | ICD-10-CM

## 2016-02-03 DIAGNOSIS — I5033 Acute on chronic diastolic (congestive) heart failure: Secondary | ICD-10-CM

## 2016-02-03 DIAGNOSIS — J189 Pneumonia, unspecified organism: Secondary | ICD-10-CM

## 2016-02-03 DIAGNOSIS — D509 Iron deficiency anemia, unspecified: Secondary | ICD-10-CM

## 2016-02-03 LAB — BASIC METABOLIC PANEL
ANION GAP: 7 (ref 5–15)
BUN: 8 mg/dL (ref 6–20)
CALCIUM: 8.7 mg/dL — AB (ref 8.9–10.3)
CO2: 26 mmol/L (ref 22–32)
Chloride: 100 mmol/L — ABNORMAL LOW (ref 101–111)
Creatinine, Ser: 0.93 mg/dL (ref 0.61–1.24)
GFR calc Af Amer: >60 mL/min (ref >60)
GLUCOSE: 73 mg/dL (ref 65–99)
Potassium: 3.3 mmol/L — ABNORMAL LOW (ref 3.5–5.1)
SODIUM: 133 mmol/L — AB (ref 135–145)

## 2016-02-03 LAB — CBC
HCT: 25.7 % — ABNORMAL LOW (ref 39.0–52.0)
Hemoglobin: 7.1 g/dL — ABNORMAL LOW (ref 13.0–17.0)
MCH: 16.7 pg — AB (ref 26.0–34.0)
MCHC: 27.6 g/dL — ABNORMAL LOW (ref 30.0–36.0)
MCV: 60.6 fL — AB (ref 78.0–100.0)
PLATELETS: 187 10*3/uL (ref 150–400)
RBC: 4.24 MIL/uL (ref 4.22–5.81)
RDW: 21.7 % — AB (ref 11.5–15.5)
WBC: 5.1 10*3/uL (ref 4.0–10.5)

## 2016-02-03 LAB — STREP PNEUMONIAE URINARY ANTIGEN: STREP PNEUMO URINARY ANTIGEN: NEGATIVE

## 2016-02-03 LAB — PROTIME-INR
INR: 2.36
PROTHROMBIN TIME: 26.3 s — AB (ref 11.4–15.2)

## 2016-02-03 LAB — HIV ANTIBODY (ROUTINE TESTING W REFLEX): HIV SCREEN 4TH GENERATION: NONREACTIVE

## 2016-02-03 IMAGING — DX DG CHEST 1V PORT
1 series · 1 of 1 positions shown · non-contrast
Comparison: [DATE]

CLINICAL DATA: Acute on chronic diastolic heart failure

EXAM:
PORTABLE CHEST 1 VIEW

[chest ap]
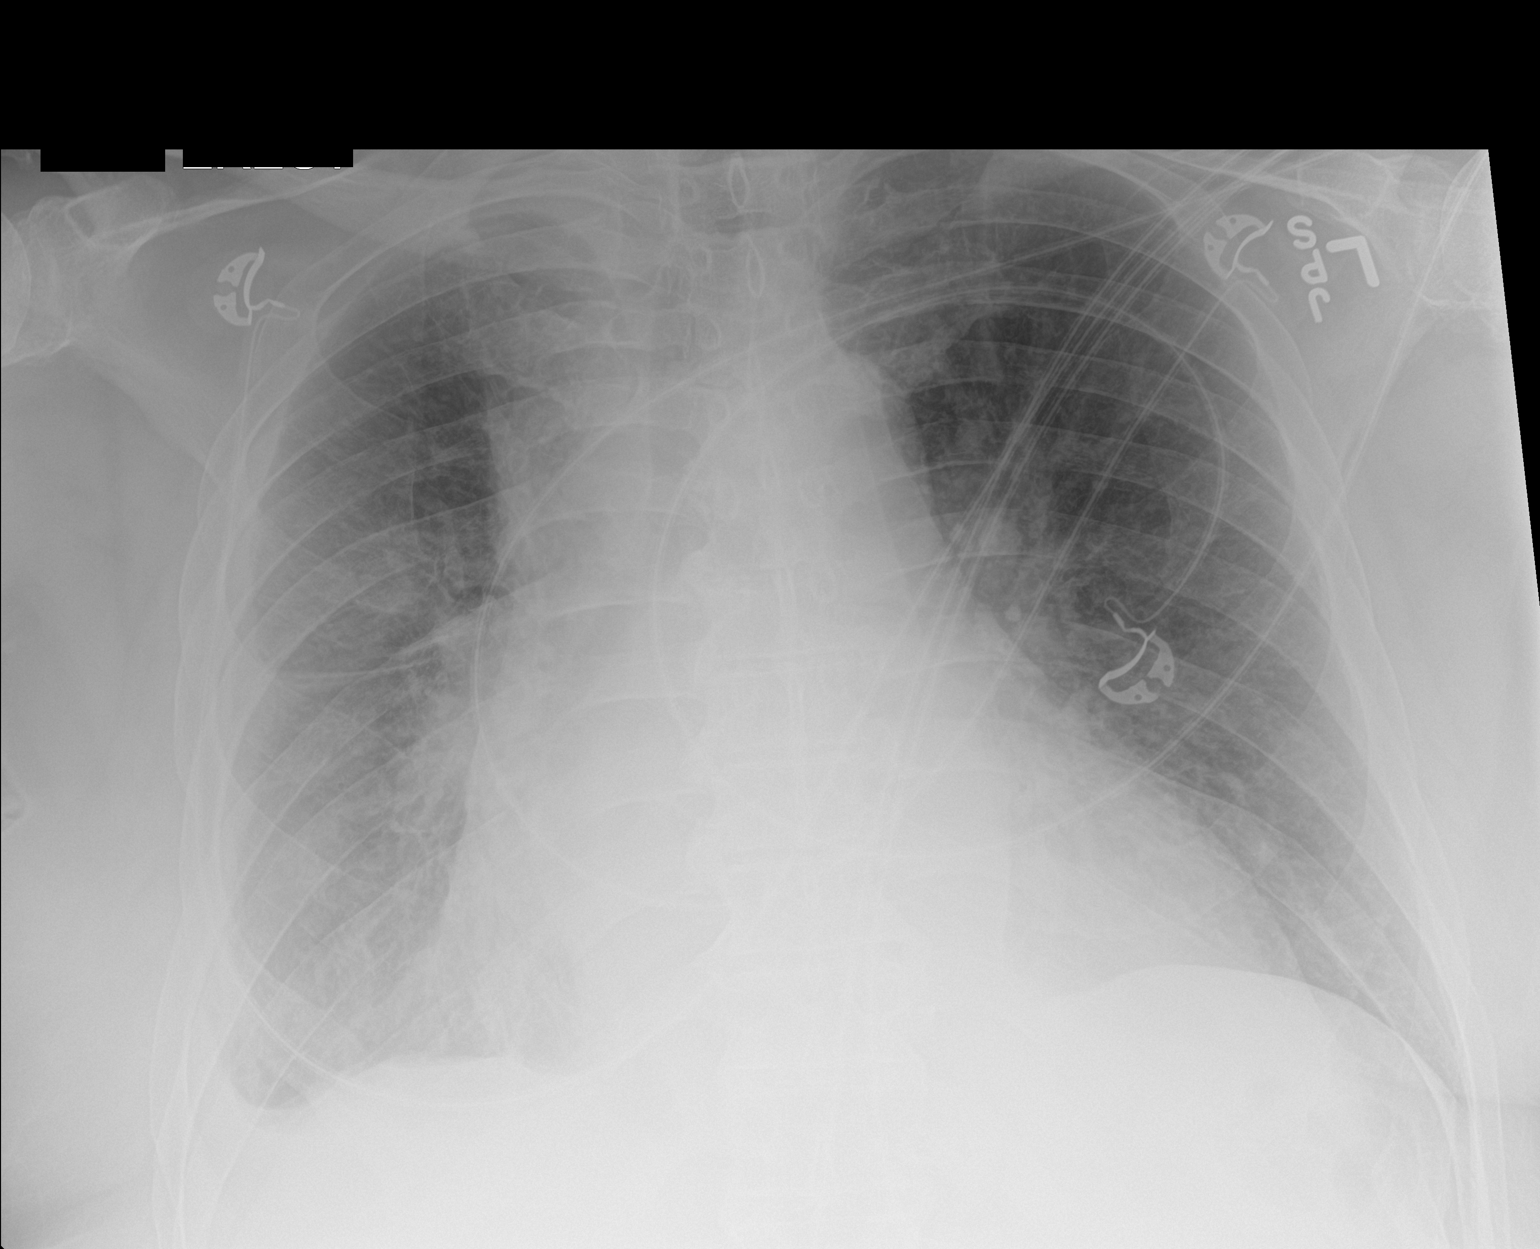

[1 of 1 positions shown; findings below may reference images not displayed]

FINDINGS: Cardiac enlargement. Pulmonary vascular congestion. Increased
interstitial markings especially on the right may represent mild
edema.

Small right pleural effusion unchanged. Right lower lobe airspace
disease slightly improved and may represent atelectasis versus
pneumonia. Mild left lower lobe atelectasis unchanged. No effusion
on the left.
IMPRESSION: Progression of interstitial lung markings suggestive of edema.

Improved right lower lobe airspace density which may represent
atelectasis or infiltrate. Small right effusion unchanged.

## 2016-02-03 MED ORDER — GUAIFENESIN-DM 100-10 MG/5ML PO SYRP
5.0000 mL | ORAL_SOLUTION | ORAL | Status: DC | PRN
  Administered 2016-02-03 – 2016-02-05 (×3): 5 mL via ORAL
  Filled 2016-02-03 (×3): qty 5

## 2016-02-03 MED ORDER — FERROUS SULFATE 325 (65 FE) MG PO TABS
325.0000 mg | ORAL_TABLET | Freq: Two times a day (BID) | ORAL | Status: DC
  Administered 2016-02-03 – 2016-02-05 (×5): 325 mg via ORAL
  Filled 2016-02-03 (×5): qty 1

## 2016-02-03 MED ORDER — POTASSIUM CHLORIDE CRYS ER 20 MEQ PO TBCR
40.0000 meq | EXTENDED_RELEASE_TABLET | Freq: Two times a day (BID) | ORAL | Status: DC
  Administered 2016-02-03 – 2016-02-06 (×6): 40 meq via ORAL
  Filled 2016-02-03 (×6): qty 2

## 2016-02-03 NOTE — Plan of Care (Signed)
Problem: Pain Managment: Goal: General experience of comfort will improve Outcome: Progressing Patient experienced no pain on my shift. Patient aware of how to call if he needs assistance with pain management.  Problem: Physical Regulation: Goal: Ability to maintain clinical measurements within normal limits will improve Outcome: Progressing Patient condition improving. Patient states he is feeling better and notices improvement with the increase in lasix/ urine output. Goal: Will remain free from infection Outcome: Progressing Patient instructed on how to maintain proper hygiene. Hands washed, bath taken, and continuing to monitor labs for any signs of infection. Currently on antibiotics to treat PNA.   Problem: Skin Integrity: Goal: Risk for impaired skin integrity will decrease Outcome: Progressing Patient able to move self in bed. Instructed patient to ensure he is moving and not staying in same position- as it is bad for his skin. Voiced understanding.   Problem: Tissue Perfusion: Goal: Risk factors for ineffective tissue perfusion will decrease Outcome: Progressing Patient received coumadin PO tonight.   Problem: Activity: Goal: Risk for activity intolerance will decrease Outcome: Progressing Patient demonstrates ability to move independently in room. Eager to return to baseline activity.   Problem: Fluid Volume: Goal: Ability to maintain a balanced intake and output will improve Outcome: Progressing Patient currently being diuresed to remove extra fluid. Receiving 80 mg Lasix BID, with great urine output.  Problem: Nutrition: Goal: Adequate nutrition will be maintained Outcome: Completed/Met Date Met: 02/03/16 Patient completing meal trays and drinking adequate fluid ( within guidelines).  Problem: Bowel/Gastric: Goal: Will not experience complications related to bowel motility Outcome: Progressing Patient given medication ( colace) to help with regulating BM's.

## 2016-02-03 NOTE — Progress Notes (Signed)
ANTICOAGULATION CONSULT NOTE - Follow up consult  Pharmacy Consult for Warfarin Indication: atrial fibrillation  No Known Allergies  Patient Measurements: Height: 5\' 11"  (180.3 cm) Weight: 255 lb 9.6 oz (115.9 kg) IBW/kg (Calculated) : 75.3  Vital Signs: Temp: 98.5 F (36.9 C) (08/02 0722) Temp Source: Oral (08/02 0722) BP: 149/91 (08/02 0722) Pulse Rate: 78 (08/02 0722)  Labs:  Recent Labs  02/02/16 1215 02/02/16 1728 02/03/16 0202  HGB 7.6* 7.3* 7.1*  HCT 25.5* 26.2* 25.7*  PLT 227 196 187  LABPROT 25.2*  --  26.3*  INR 2.24  --  2.36  CREATININE 0.88  --  0.93  TROPONINI <0.03  --   --     Estimated Creatinine Clearance: 103.9 mL/min (by C-G formula based on SCr of 0.93 mg/dL).   Medical History: Past Medical History:  Diagnosis Date  . Anemia   . Arthralgia of both knees   . Arthritis    "right knee" (10/01/2015)  . Chronic anticoagulation 2010   Coumadin  . Chronic diastolic CHF (congestive heart failure), NYHA class 2 ([place])   . Hypertension   . OSA (obstructive sleep apnea) 2016   "couldn't take the mask during the testing" (10/01/2015)  . Permanent atrial fibrillation ([place]) 2010  . Pneumonia 09/30/2015    Medications:  Scheduled:  . aspirin EC  81 mg Oral Daily  . azithromycin  500 mg Oral Q24H  . cefTRIAXone (ROCEPHIN)  IV  1 g Intravenous Q24H  . docusate sodium  100 mg Oral BID  . furosemide  80 mg Intravenous Q12H  . hydrALAZINE  50 mg Oral BID  . ipratropium-albuterol  3 mL Nebulization QID  . metoprolol tartrate  25 mg Oral BID  . potassium chloride SA  40 mEq Oral Daily  . sodium chloride flush  3 mL Intravenous Q12H  . warfarin  8 mg Oral q1800  . Warfarin - Pharmacist Dosing Inpatient   Does not apply q1800    Assessment: 65 year old male with hx of chronic AFib, on coumadin 8mg  daily PTA. INR on admit is therapeutic at 2.24. INR today remains therapeutic at 2.36. Hgb slowly trending down to 7.1, plts wnl. No s/s of bleed.  Goal  of Therapy:  INR 2-3 Monitor platelets by anticoagulation protocol: Yes   Plan:  Continue Coumadin 8mg  PO daily Monitor daily INR, CBC, s/s of bleed  Elenor Quinones, PharmD, BCPS Clinical Pharmacist Pager [phone] 02/03/2016 8:08 AM

## 2016-02-03 NOTE — Care Management Note (Signed)
Case Management Note  Patient Details  Name: Steven Barnett MRN: [id number] Date of Birth: [birth year]  Subjective/Objective:   Pt lives with his daughter, was (I) PTA.  Discussed home health nursing for congestive failure education and management.  Pt refuses, states his daughter is a Marine scientist and is available to help him as needed.                             Expected Discharge Plan:  Home/Self Care  Discharge planning Services  CM Consult  HH Arranged:  Patient Refused [facility] Agency:     Status of Service:  Completed, signed off  Girard Cooter, [place] 02/03/2016, 4:17 PM

## 2016-02-03 NOTE — Progress Notes (Signed)
PROGRESS NOTE    Steven Barnett  [id number] DOB: 25-Mar-1951 DOA: 02/02/2016 PCP: Elisabeth Cara, PA-C    Brief Narrative:  Steven Barnett is a 65 y.o. 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 He was admitted for CAP AND possible diastolic heart failure.    Assessment & Plan:   Principal Problem:   Acute on chronic diastolic heart failure (HCC) Active Problems:   Morbid obesity ([place])   Essential hypertension, benign   Permanent atrial fibrillation (HCC)   Acute on chronic diastolic (congestive) heart failure (HCC)   Chronic anticoagulation - Coumadin, CHADS2VASC=2   CAP (community acquired pneumonia)   Acute respiratory failure with hypoxia (HCC)   Anemia, iron deficiency   Acute respiratory failure secondary to possible CAP and acut eon chronic diastolic heart failure: Admitted to step down and started on IV antibiotics for the pneumonia and IV lasix 80 mg BID .  Diuresed about 4 liters since admission, plan for repeat CXR in am.  Currently on 2 to 3 lit of [place] oxygen to keep sats greater than 90%.  Plan to wean him off oxygen in the next 24 hours.  Strict intake and output and daily weights.  Blood cultures are done and negative so far.    Hypertension: controlled.   Chronic atrial fibrillation:: Rate controlled.  Therapeutic iNR.  Resume coumadin as per pharmacy.  Resume BB.   ANEMIA; iron deficient.  Will get GI consult in am.  Monitor cbc in am.  Occult stool ordered and pending.         DVT prophylaxis: (Lovenox) Code Status: (Full/) Family Communication: none at bedside, discussed the plan o fcare with the patient.  Disposition Plan: pending further management.   Consultants:   none   Procedures:  none   Antimicrobials:Rocpehin and zithromax 8/1   Subjective: Reports feeling much better today.   Objective: Vitals:   02/03/16 0939 02/03/16 1042 02/03/16 1227 02/03/16 1606  BP:  128/88 122/65 124/82  Pulse:  80  69  Resp:    (!) 29  Temp:   98.6 F (37 C) 98.1 F (36.7 C)  TempSrc:   Oral Oral  SpO2: 96%   100%  Weight:      Height:        Intake/Output Summary (Last 24 hours) at 02/03/16 1621 Last data filed at 02/03/16 1500  Gross per 24 hour  Intake              863 ml  Output             4895 ml  Net            -4032 ml   Filed Weights   02/02/16 1207 02/02/16 1613 02/03/16 0356  Weight: 117.9 kg (260 lb) 117.4 kg (258 lb 12.8 oz) 115.9 kg (255 lb 9.6 oz)    Examination:  General exam: Appears calm and comfortable, on 3 lit [place] oxygen.  Respiratory system:  Respiratory effort normal. No wheezing heard. Diminished at bases.  Basilar rales.  Cardiovascular system: S1 & S2 heard,irregular,. No JVD, murmurs, rubs, gallops or clicks. No pedal edema. Gastrointestinal system: Abdomen is nondistended, soft and nontender. No organomegaly or masses felt. Normal bowel sounds heard. Central nervous system: Alert and oriented. No focal neurological deficits. Extremities: Symmetric  5 x 5 power. Skin: No rashes, lesions or ulcers Psychiatry: Judgement and insight appear normal. Mood & affect appropriate.     Data Reviewed: I have personally reviewed following labs and imaging studies  CBC:  Recent Labs Lab 02/02/16 1215 02/02/16 1728 02/03/16 0202  WBC 7.6 6.5 5.1  NEUTROABS  --  5.0  --   HGB 7.6* 7.3* 7.1*  HCT 25.5* 26.2* 25.7*  MCV 58.5* 60.9* 60.6*  PLT 227 196 [id number]   Basic Metabolic Panel:  Recent Labs Lab 02/02/16 1215 02/03/16 0202  NA 136 133*  K 3.3* 3.3*  CL 100* 100*  CO2 29 26  GLUCOSE 81 73  BUN 12 8  CREATININE 0.88 0.93  CALCIUM 8.7* 8.7*   GFR: Estimated Creatinine Clearance: 103.9 mL/min (by C-G formula based on SCr of 0.93  mg/dL). Liver Function Tests: No results for input(s): AST, ALT, ALKPHOS, BILITOT, PROT, ALBUMIN in the last 168 hours. No results for input(s): LIPASE, AMYLASE in the last 168 hours. No results for input(s): AMMONIA in the last 168 hours. Coagulation Profile:  Recent Labs Lab 02/02/16 1215 02/03/16 0202  INR 2.24 2.36   Cardiac Enzymes:  Recent Labs Lab 02/02/16 1215  TROPONINI <0.03   BNP (last 3 results) No results for input(s): PROBNP in the last 8760 hours. HbA1C: No results for input(s): HGBA1C in the last 72 hours. CBG: No results for input(s): GLUCAP in the last 168 hours. Lipid Profile: No results for input(s): CHOL, HDL, LDLCALC, TRIG, CHOLHDL, LDLDIRECT in the last 72 hours. Thyroid Function Tests:  Recent Labs  02/02/16 1728  TSH 0.672   Anemia Panel:  Recent Labs  02/02/16 1728  VITAMINB12 457  FOLATE 11.8  FERRITIN 17*  TIBC 455*  IRON 33*   Sepsis Labs:  Recent Labs Lab 02/02/16 1338 02/02/16 1728  PROCALCITON  --  <0.10  LATICACIDVEN 1.24  --     Recent Results (from the past 240 hour(s))  Culture, blood (Routine X 2) w Reflex to ID Panel     Status: None (Preliminary result)   Collection Time: 02/02/16 12:15 PM  Result Value Ref Range Status   Specimen Description BLOOD LEFT AC  Final   Special Requests BOTTLES DRAWN AEROBIC AND ANAEROBIC 5CC EACH  Final   Culture   Final    NO GROWTH < 24 HOURS Performed at [facility]    Report Status PENDING  Incomplete  Culture, blood (Routine X 2) w Reflex to ID Panel     Status: None (Preliminary result)   Collection Time: 02/02/16  1:30 PM  Result Value Ref Range Status   Specimen Description BLOOD RIGHT [facility]  Final   Special Requests BOTTLES DRAWN AEROBIC AND ANAEROBIC 5CC EACH  Final   Culture   Final    NO GROWTH < 24 HOURS Performed at [facility]    Report Status PENDING  Incomplete  MRSA PCR Screening     Status: None   Collection Time: 02/02/16  5:15 PM  Result  Value Ref Range Status   MRSA by PCR NEGATIVE NEGATIVE Final    Comment:        The GeneXpert MRSA Assay (FDA approved for NASAL specimens only), is one component of a comprehensive MRSA colonization surveillance program. It is not intended to diagnose MRSA infection nor to guide or monitor treatment for MRSA infections.          Radiology Studies: Dg Chest 2 View  Result Date: 02/02/2016 CLINICAL  DATA:  Cough, shortness of breath, and chest pressure. EXAM: CHEST  2 VIEW COMPARISON:  09/30/2015 FINDINGS: The cardiac silhouette remains enlarged. There is worsening airspace opacity in the right lung base. There is chronic blunting of the costophrenic angle on the right, however this is slightly more prominent than on the prior study which may reflect a small pleural effusion superimposed on chronic pleural thickening. No pneumothorax. Thoracolumbar spondylosis. IMPRESSION: Increased right basilar airspace opacity concerning for pneumonia. Possible small right pleural effusion. Electronically Signed   By: Logan Bores M.D.   On: 02/02/2016 13:01   Dg Chest Port 1 View  Result Date: 02/03/2016 CLINICAL DATA:  Acute on chronic diastolic heart failure EXAM: PORTABLE CHEST 1 VIEW COMPARISON:  02/02/2016 FINDINGS: Cardiac enlargement. Pulmonary vascular congestion. Increased interstitial markings especially on the right may represent mild edema. Small right pleural effusion unchanged. Right lower lobe airspace disease slightly improved and may represent atelectasis versus pneumonia. Mild left lower lobe atelectasis unchanged. No effusion on the left. IMPRESSION: Progression of interstitial lung markings suggestive of edema. Improved right lower lobe airspace density which may represent atelectasis or infiltrate. Small right effusion unchanged. Electronically Signed   By: Franchot Gallo M.D.   On: 02/03/2016 07:47        Scheduled Meds: . aspirin EC  81 mg Oral Daily  . azithromycin  500 mg  Oral Q24H  . cefTRIAXone (ROCEPHIN)  IV  1 g Intravenous Q24H  . docusate sodium  100 mg Oral BID  . ferrous sulfate  325 mg Oral BID WC  . furosemide  80 mg Intravenous Q12H  . hydrALAZINE  50 mg Oral BID  . ipratropium-albuterol  3 mL Nebulization QID  . metoprolol tartrate  25 mg Oral BID  . potassium chloride SA  40 mEq Oral BID  . sodium chloride flush  3 mL Intravenous Q12H  . warfarin  8 mg Oral q1800  . Warfarin - Pharmacist Dosing Inpatient   Does not apply q1800   Continuous Infusions: . sodium chloride Stopped (02/02/16 1221)  . sodium chloride 10 mL/hr at 02/02/16 1800     LOS: 1 day    Time spent:35 minutes.     Hosie Poisson, MD [facility] Pager [phone]  If 7PM-7AM, please contact night-coverage [email or web address] Password [email or web address] 02/03/2016, 4:21 PM

## 2016-02-04 ENCOUNTER — Inpatient Hospital Stay (HOSPITAL_COMMUNITY): Payer: Medicaid Other

## 2016-02-04 LAB — CBC
HEMATOCRIT: 26.5 % — AB (ref 39.0–52.0)
Hemoglobin: 7.2 g/dL — ABNORMAL LOW (ref 13.0–17.0)
MCH: 16.7 pg — ABNORMAL LOW (ref 26.0–34.0)
MCHC: 27.2 g/dL — ABNORMAL LOW (ref 30.0–36.0)
MCV: 61.3 fL — AB (ref 78.0–100.0)
Platelets: 202 10*3/uL (ref 150–400)
RBC: 4.32 MIL/uL (ref 4.22–5.81)
RDW: 21.8 % — AB (ref 11.5–15.5)
WBC: 6.5 10*3/uL (ref 4.0–10.5)

## 2016-02-04 LAB — BASIC METABOLIC PANEL
Anion gap: 7 (ref 5–15)
BUN: 9 mg/dL (ref 6–20)
CALCIUM: 8.6 mg/dL — AB (ref 8.9–10.3)
CO2: 28 mmol/L (ref 22–32)
Chloride: 99 mmol/L — ABNORMAL LOW (ref 101–111)
Creatinine, Ser: 0.87 mg/dL (ref 0.61–1.24)
GFR calc non Af Amer: >60 mL/min (ref >60)
GLUCOSE: 77 mg/dL (ref 65–99)
Potassium: 3.6 mmol/L (ref 3.5–5.1)
Sodium: 134 mmol/L — ABNORMAL LOW (ref 135–145)

## 2016-02-04 LAB — PROTIME-INR
INR: 2.89
Prothrombin Time: 30.9 seconds — ABNORMAL HIGH (ref 11.4–15.2)

## 2016-02-04 LAB — PROCALCITONIN: Procalcitonin: <0.1 ng/mL

## 2016-02-04 IMAGING — CR DG CHEST 2V
2 series · 2 of 2 positions shown · non-contrast
Comparison: Radiograph [DATE].

CLINICAL DATA: Shortness of breath, cough.

EXAM:
CHEST  2 VIEW

[chest pa]
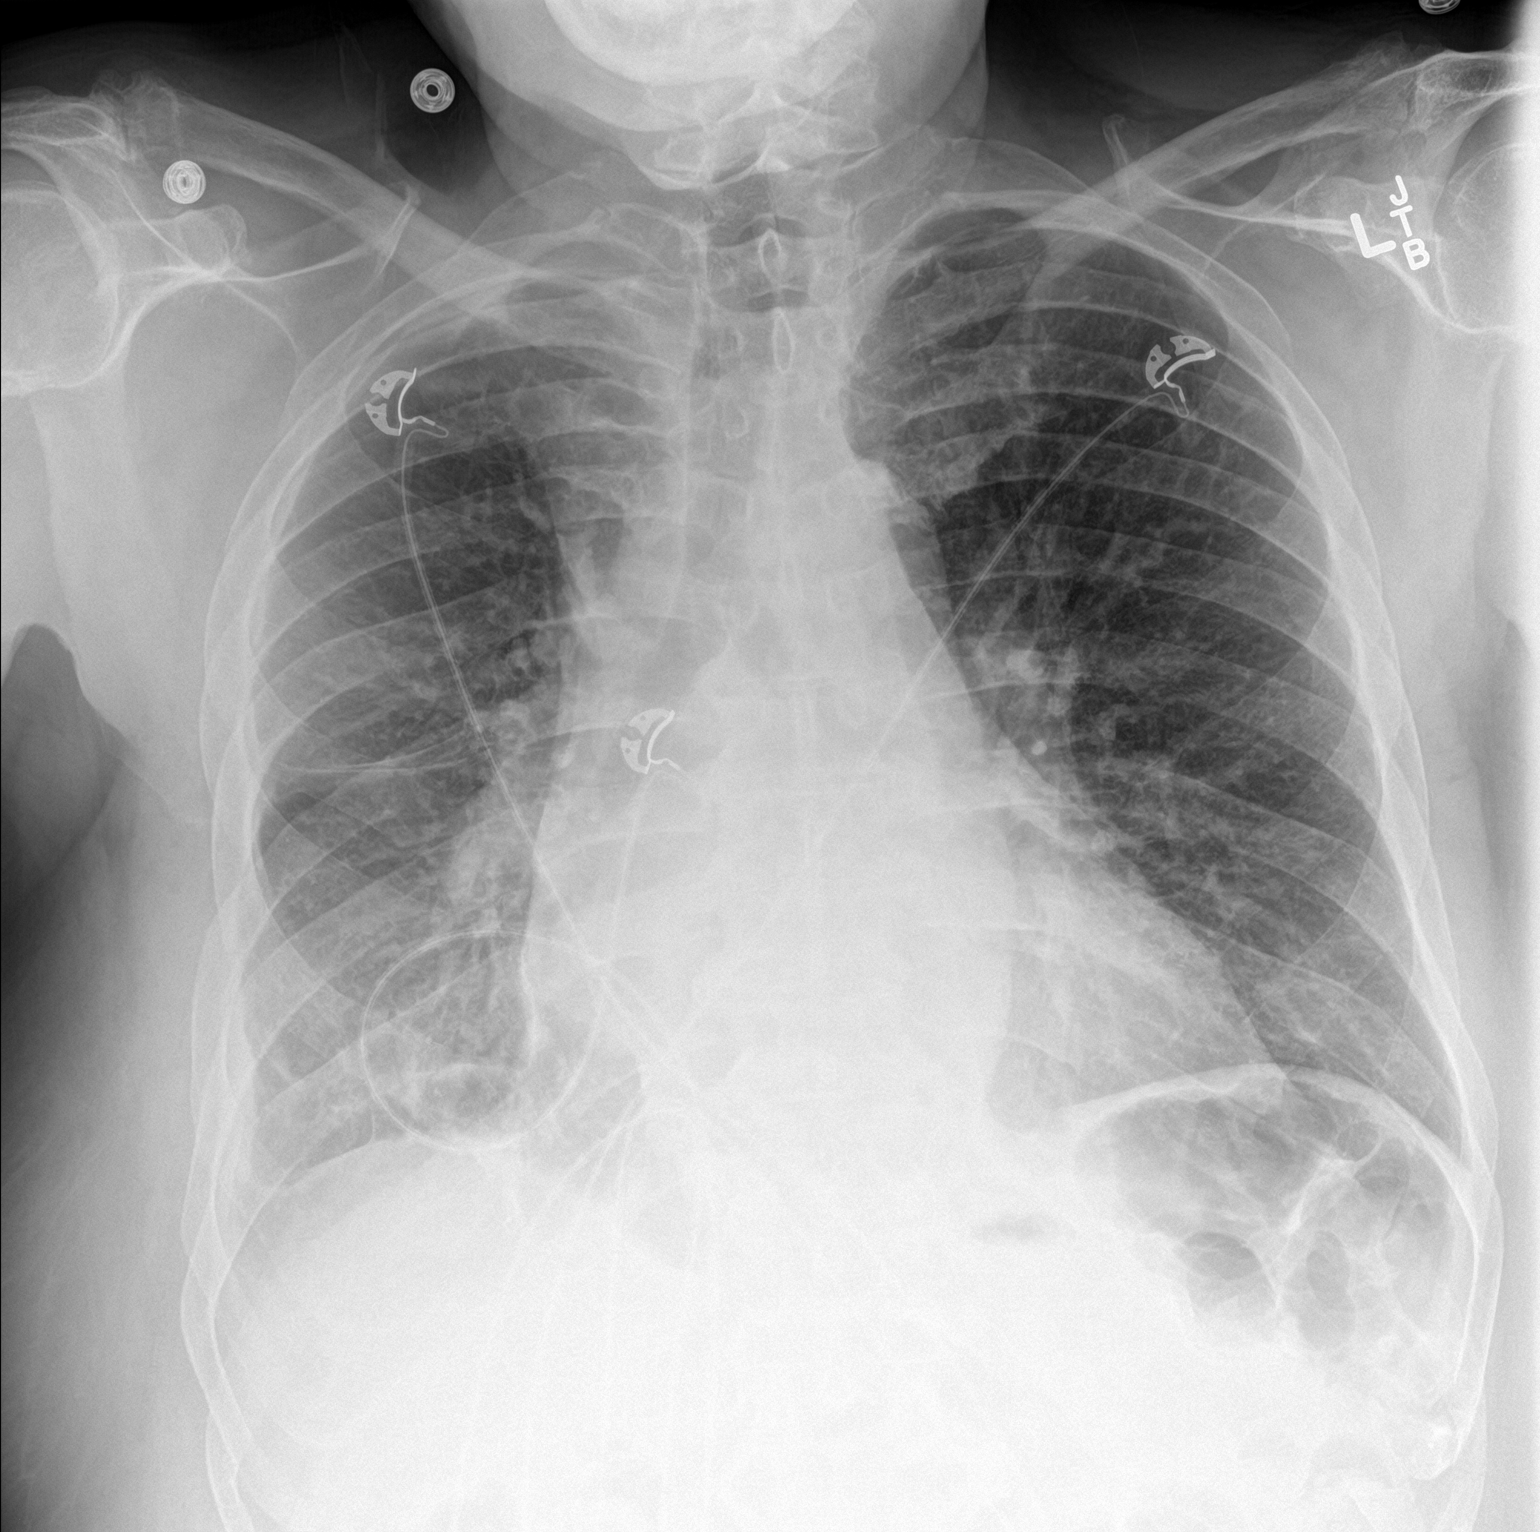

[chest lat]
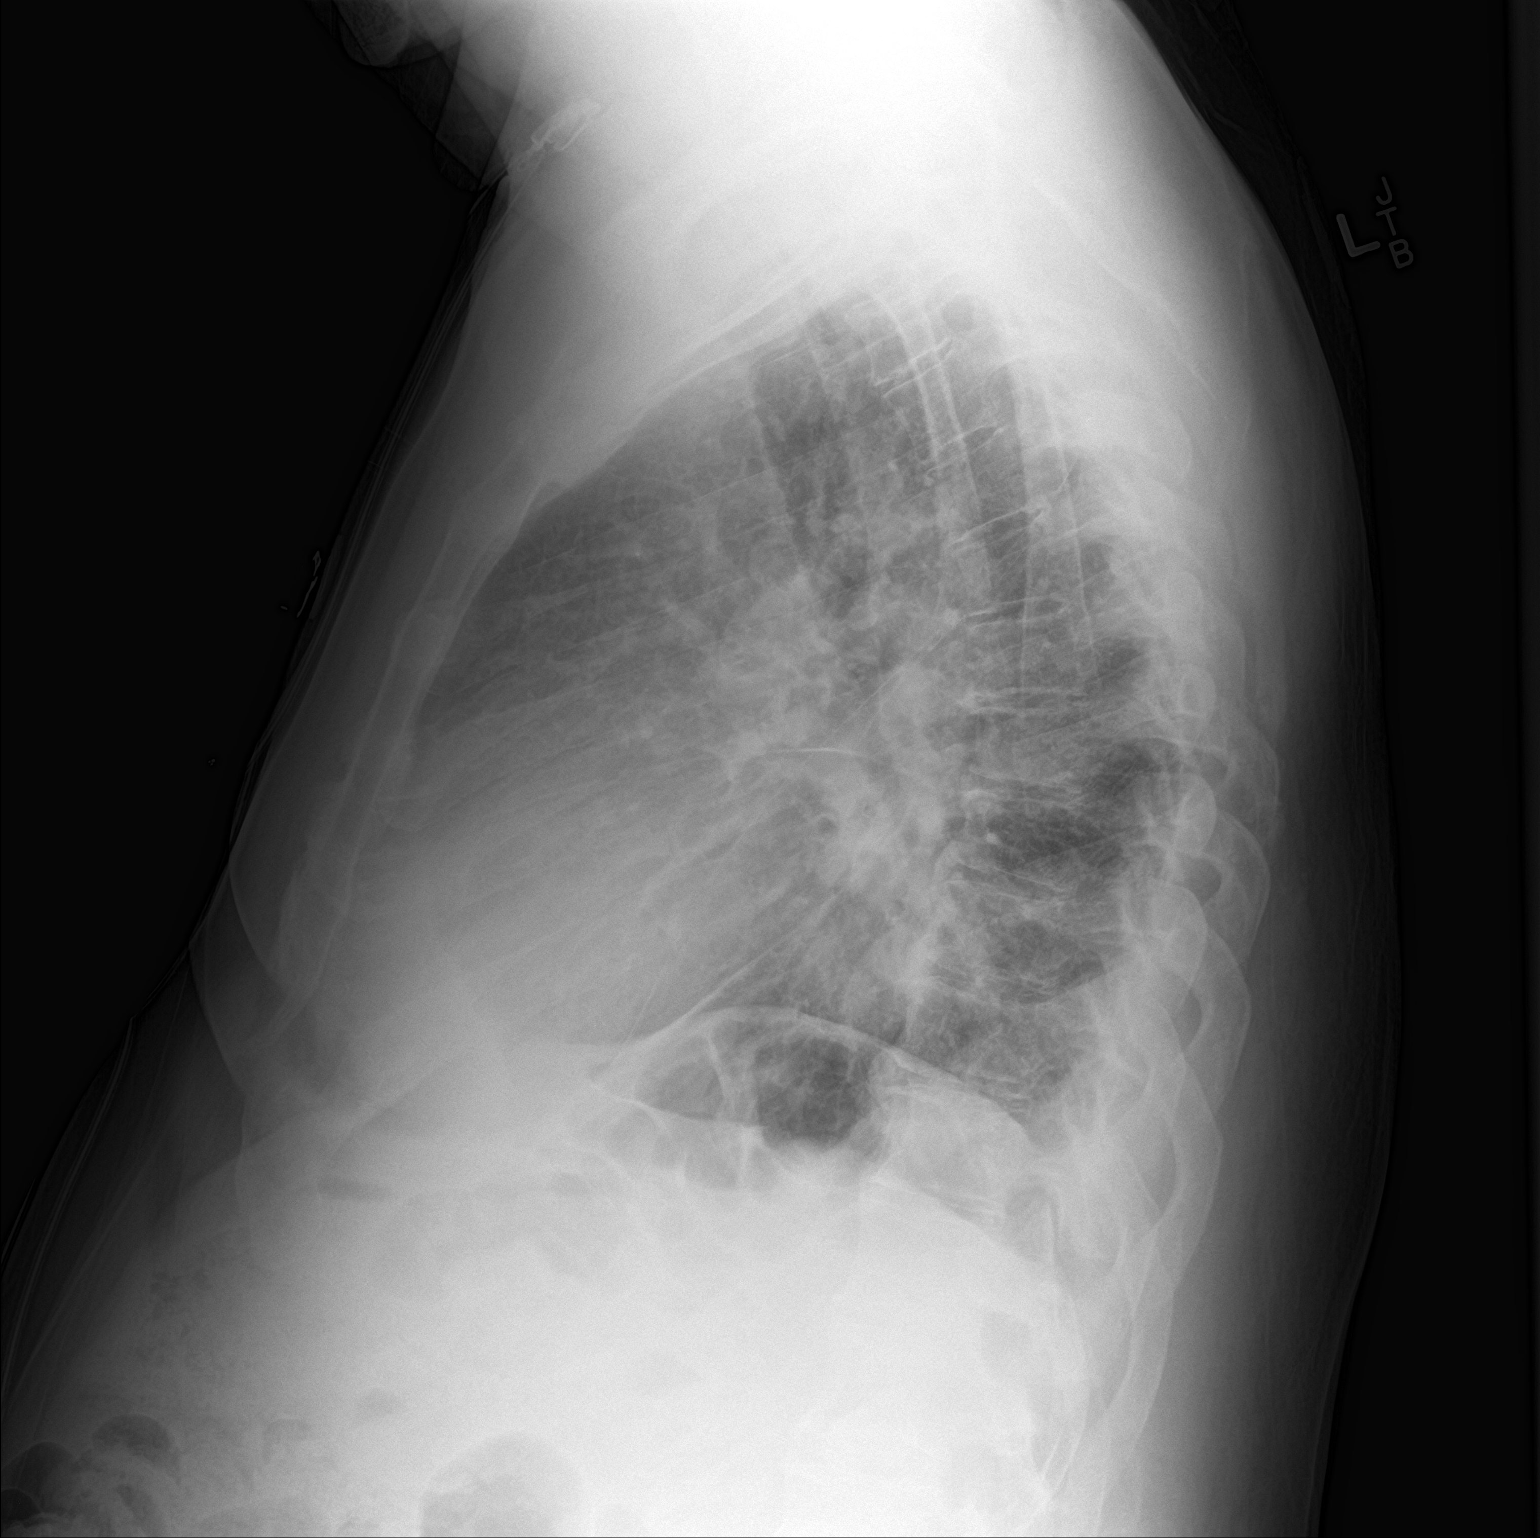

[2 of 2 positions shown; findings below may reference images not displayed]

FINDINGS: Stable cardiomegaly. No pneumothorax is noted. Stable mild
interstitial densities are noted which may represent pulmonary
edema. Stable minimal right basilar subsegmental atelectasis is
noted with minimal right pleural effusion. Bony thorax is
unremarkable.
IMPRESSION: Stable mild interstitial densities which may represent pulmonary
edema. Stable minimal right basilar subsegmental atelectasis with
minimal right pleural effusion.

## 2016-02-04 MED ORDER — WARFARIN SODIUM 4 MG PO TABS
4.0000 mg | ORAL_TABLET | Freq: Once | ORAL | Status: AC
  Administered 2016-02-04: 4 mg via ORAL
  Filled 2016-02-04: qty 1

## 2016-02-04 MED ORDER — POLYETHYLENE GLYCOL 3350 17 G PO PACK
17.0000 g | PACK | Freq: Every day | ORAL | Status: DC
  Administered 2016-02-04 – 2016-02-06 (×3): 17 g via ORAL
  Filled 2016-02-04 (×3): qty 1

## 2016-02-04 MED ORDER — IPRATROPIUM-ALBUTEROL 0.5-2.5 (3) MG/3ML IN SOLN
3.0000 mL | Freq: Three times a day (TID) | RESPIRATORY_TRACT | Status: DC
  Administered 2016-02-04 – 2016-02-05 (×6): 3 mL via RESPIRATORY_TRACT
  Filled 2016-02-04 (×9): qty 3

## 2016-02-04 MED ORDER — SENNOSIDES-DOCUSATE SODIUM 8.6-50 MG PO TABS
2.0000 | ORAL_TABLET | Freq: Two times a day (BID) | ORAL | Status: DC
  Administered 2016-02-04 – 2016-02-06 (×5): 2 via ORAL
  Filled 2016-02-04 (×5): qty 2

## 2016-02-04 NOTE — Plan of Care (Signed)
Problem: Education: Goal: Knowledge of [facility] Education information/materials will improve Outcome: Progressing Discussed with patient that he no longer needs oxygen. Per MD patient will be transferred to Telemetry unit for further monitoring. No longer requires step-down ICU. Patient aware he will be transferred. Reports no pain.   Problem: Safety: Goal: Ability to remain free from injury will improve Outcome: Progressing Patient continues to let staff know before getting out of bed.   Problem: Bowel/Gastric: Goal: Will not experience complications related to bowel motility Outcome: Progressing Patient continues to receive stool softener. If no BM will ask doctor for GI orders.

## 2016-02-04 NOTE — Progress Notes (Signed)
Report given to RN taking care of patient on 3E. Patient transferred via wheelchair to 3E07 with belongings. Belongings included cell phone, clothing, and shoes. CCMD notified of transfer.   [place] Cage, RN

## 2016-02-04 NOTE — Progress Notes (Signed)
ANTICOAGULATION CONSULT NOTE - Follow up consult  Pharmacy Consult for Warfarin Indication: atrial fibrillation  No Known Allergies  Patient Measurements: Height: 5\' 11"  (180.3 cm) Weight: 253 lb 1.6 oz (114.8 kg) IBW/kg (Calculated) : 75.3  Vital Signs: Temp: 98.8 F (37.1 C) (08/03 0800) Temp Source: Oral (08/03 0800) BP: 130/54 (08/03 0906) Pulse Rate: 93 (08/03 0906)  Labs:  Recent Labs  02/02/16 1215 02/02/16 1728 02/03/16 0202 02/04/16 0511  HGB 7.6* 7.3* 7.1* 7.2*  HCT 25.5* 26.2* 25.7* 26.5*  PLT 227 196 187 202  LABPROT 25.2*  --  26.3* 30.9*  INR 2.24  --  2.36 2.89  CREATININE 0.88  --  0.93 0.87  TROPONINI <0.03  --   --   --     Estimated Creatinine Clearance: 110.5 mL/min (by C-G formula based on SCr of 0.87 mg/dL).   Medical History: Past Medical History:  Diagnosis Date  . Anemia   . Arthralgia of both knees   . Arthritis    "right knee" (10/01/2015)  . Chronic anticoagulation 2010   Coumadin  . Chronic diastolic CHF (congestive heart failure), NYHA class 2 ([place])   . Hypertension   . OSA (obstructive sleep apnea) 2016   "couldn't take the mask during the testing" (10/01/2015)  . Permanent atrial fibrillation ([place]) 2010  . Pneumonia 09/30/2015    Medications:  Scheduled:  . aspirin EC  81 mg Oral Daily  . azithromycin  500 mg Oral Q24H  . cefTRIAXone (ROCEPHIN)  IV  1 g Intravenous Q24H  . docusate sodium  100 mg Oral BID  . ferrous sulfate  325 mg Oral BID WC  . furosemide  80 mg Intravenous Q12H  . hydrALAZINE  50 mg Oral BID  . ipratropium-albuterol  3 mL Nebulization TID  . metoprolol tartrate  25 mg Oral BID  . potassium chloride SA  40 mEq Oral BID  . sodium chloride flush  3 mL Intravenous Q12H  . warfarin  4 mg Oral ONCE-1800  . Warfarin - Pharmacist Dosing Inpatient   Does not apply q1800    Assessment: 65 year old male with hx of chronic AFib, on coumadin 8mg  daily PTA. INR on admit is therapeutic at 2.24. INR today  remains therapeutic at 2.89 but trending up. On azithromycin so may need lower dose for the next week or so. Hgb low but stable at 7.2, plts wnl. No s/s of bleed.  Goal of Therapy:  INR 2-3 Monitor platelets by anticoagulation protocol: Yes   Plan:  Give Coumadin 4mg  PO tonight Monitor daily INR, CBC, s/s of bleed  Elenor Quinones, PharmD, BCPS Clinical Pharmacist Pager [phone] 02/04/2016 9:10 AM

## 2016-02-04 NOTE — Progress Notes (Signed)
PROGRESS NOTE    Steven Barnett  [id number] DOB: [birth year] DOA: 02/02/2016 PCP: Elisabeth Cara, PA-C    Brief Narrative:  Steven Barnett is a 65 y.o. 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 He was admitted for CAP AND possible diastolic heart failure.    Assessment & Plan:   Principal Problem:   Acute on chronic diastolic heart failure (HCC) Active Problems:   Morbid obesity ([place])   Essential hypertension, benign   Permanent atrial fibrillation (HCC)   Acute on chronic diastolic (congestive) heart failure (HCC)   Chronic anticoagulation - Coumadin, CHADS2VASC=2   CAP (community acquired pneumonia)   Acute respiratory failure with hypoxia (HCC)   Anemia, iron deficiency   Acute respiratory failure secondary to possible CAP and acute on chronic diastolic heart failure: Admitted to step down and started on IV antibiotics for the pneumonia and IV lasix 80 mg BID .  Diuresed about 10 liters since admission,  Repeat CXR shows mild pulmonary edema with minimal right pleural effusion, no mention of a pneumonia. Monitor renal parameters while on lasix.  He was initially on 2 lit of [place] oxygen, and we were able to wean him off the oxygen.  Strict intake and output and daily weights.  Blood cultures are done and negative so far.  HIV pcr is negative.  Urine for strep pneumoniae is negative.  Sputum cultures ordered , could not be collected.  He has been afebrile and no leukocytosis, and his lactic acid and pro calcitonin have all been negative. Will do a short course of antibiotics for his presumed pneumonia.    Hypertension: controlled.   Chronic atrial fibrillation:: Rate controlled.  Therapeutic INR.  Resume coumadin as per pharmacy.  Resume BB.    Microcytic ANEMIA; iron deficient.  Baseline hemoglobin is around 9.  Occult stool ordered and pending.  Iron supplements added to his med regimen.  Will probably need a GI consult this admission if his stool is guiac positive.    Hypokalemia  repleted and repeat level is normal.   Hypertension:  Controlled. Resume hydralazine.  And lopressor.   Constipation:  -added stool softeners and laxative.         DVT prophylaxis: (coumadin. Code Status: (Full/) Family Communication: none at bedside, discussed the plan of care with the patient.  Disposition Plan: home in 1 to 2 days.    Consultants:   none   Procedures: none   Antimicrobials:Rocpehin and zithromax 8/1   Subjective: No sleep last night, from persistent cough.  Cough medication added over night and he feels better .    Objective: Vitals:   02/04/16 0906 02/04/16 1000 02/04/16 1200 02/04/16 1415  BP: (!) 130/54 (!) 143/73 123/75   Pulse: 93 (!) 25 84   Resp:  (!) 22 (!) 29   Temp:   98.5 F (36.9 C)   TempSrc:   Oral   SpO2:  (!) 81% 93% 95%  Weight:      Height:        Intake/Output Summary (Last 24 hours) at 02/04/16 1441 Last data filed at 02/04/16 1309  Gross per 24 hour  Intake              600 ml  Output  4700 ml  Net            -4100 ml   Filed Weights   02/02/16 1613 02/03/16 0356 02/04/16 0500  Weight: 117.4 kg (258 lb 12.8 oz) 115.9 kg (255 lb 9.6 oz) 114.8 kg (253 lb 1.6 oz)    Examination:  General exam: Appears calm and comfortable, off oxygen.  Respiratory system:  Respiratory effort normal. No wheezing heard. Diminished at bases.  No rhonchi. Rales improved.  Cardiovascular system: S1 & S2 heard,irregular,. No JVD, murmurs, rubs, gallops or clicks. No pedal edema. Gastrointestinal system: Abdomen is nondistended, soft and nontender. No organomegaly or masses felt. Normal bowel sounds heard. Central nervous system: Alert and oriented. No focal neurological  deficits. Extremities: Symmetric 5 x 5 power. Skin: No rashes, lesions or ulcers Psychiatry: Judgement and insight appear normal. Mood & affect appropriate.     Data Reviewed: I have personally reviewed following labs and imaging studies  CBC:  Recent Labs Lab 02/02/16 1215 02/02/16 1728 02/03/16 0202 02/04/16 0511  WBC 7.6 6.5 5.1 6.5  NEUTROABS  --  5.0  --   --   HGB 7.6* 7.3* 7.1* 7.2*  HCT 25.5* 26.2* 25.7* 26.5*  MCV 58.5* 60.9* 60.6* 61.3*  PLT 227 196 187 [id number]   Basic Metabolic Panel:  Recent Labs Lab 02/02/16 1215 02/03/16 0202 02/04/16 0511  NA 136 133* 134*  K 3.3* 3.3* 3.6  CL 100* 100* 99*  CO2 29 26 28   GLUCOSE 81 73 77  BUN 12 8 9   CREATININE 0.88 0.93 0.87  CALCIUM 8.7* 8.7* 8.6*   GFR: Estimated Creatinine Clearance: 110.5 mL/min (by C-G formula based on SCr of 0.87 mg/dL). Liver Function Tests: No results for input(s): AST, ALT, ALKPHOS, BILITOT, PROT, ALBUMIN in the last 168 hours. No results for input(s): LIPASE, AMYLASE in the last 168 hours. No results for input(s): AMMONIA in the last 168 hours. Coagulation Profile:  Recent Labs Lab 02/02/16 1215 02/03/16 0202 02/04/16 0511  INR 2.24 2.36 2.89   Cardiac Enzymes:  Recent Labs Lab 02/02/16 1215  TROPONINI <0.03   BNP (last 3 results) No results for input(s): PROBNP in the last 8760 hours. HbA1C: No results for input(s): HGBA1C in the last 72 hours. CBG: No results for input(s): GLUCAP in the last 168 hours. Lipid Profile: No results for input(s): CHOL, HDL, LDLCALC, TRIG, CHOLHDL, LDLDIRECT in the last 72 hours. Thyroid Function Tests:  Recent Labs  02/02/16 1728  TSH 0.672   Anemia Panel:  Recent Labs  02/02/16 1728  VITAMINB12 457  FOLATE 11.8  FERRITIN 17*  TIBC 455*  IRON 33*   Sepsis Labs:  Recent Labs Lab 02/02/16 1338 02/02/16 1728 02/04/16 0511  PROCALCITON  --  <0.10 <0.10  LATICACIDVEN 1.24  --   --     Recent Results (from the past 240  hour(s))  Culture, blood (Routine X 2) w Reflex to ID Panel     Status: None (Preliminary result)   Collection Time: 02/02/16 12:15 PM  Result Value Ref Range Status   Specimen Description BLOOD LEFT AC  Final   Special Requests BOTTLES DRAWN AEROBIC AND ANAEROBIC 5CC EACH  Final   Culture   Final    NO GROWTH 2 DAYS Performed at [facility]    Report Status PENDING  Incomplete  Culture, blood (Routine X 2) w Reflex to ID Panel     Status: None (Preliminary result)   Collection Time: 02/02/16  1:30 PM  Result Value Ref Range Status   Specimen Description BLOOD RIGHT [facility]  Final   Special Requests BOTTLES DRAWN AEROBIC AND ANAEROBIC 5CC EACH  Final   Culture   Final    NO GROWTH 2 DAYS Performed at [facility]    Report Status PENDING  Incomplete  MRSA PCR Screening     Status: None   Collection Time: 02/02/16  5:15 PM  Result Value Ref Range Status   MRSA by PCR NEGATIVE NEGATIVE Final    Comment:        The GeneXpert MRSA Assay (FDA approved for NASAL specimens only), is one component of a comprehensive MRSA colonization surveillance program. It is not intended to diagnose MRSA infection nor to guide or monitor treatment for MRSA infections.          Radiology Studies: Dg Chest 2 View  Result Date: 02/04/2016 CLINICAL DATA:  Shortness of breath, cough. EXAM: CHEST  2 VIEW COMPARISON:  Radiograph of February 03, 2016. FINDINGS: Stable cardiomegaly. No pneumothorax is noted. Stable mild interstitial densities are noted which may represent pulmonary edema. Stable minimal right basilar subsegmental atelectasis is noted with minimal right pleural effusion. Bony thorax is unremarkable. IMPRESSION: Stable mild interstitial densities which may represent pulmonary edema. Stable minimal right basilar subsegmental atelectasis with minimal right pleural effusion. Electronically Signed   By: Marijo Conception, M.D.   On: 02/04/2016 09:26   Dg Chest Port 1 View  Result  Date: 02/03/2016 CLINICAL DATA:  Acute on chronic diastolic heart failure EXAM: PORTABLE CHEST 1 VIEW COMPARISON:  02/02/2016 FINDINGS: Cardiac enlargement. Pulmonary vascular congestion. Increased interstitial markings especially on the right may represent mild edema. Small right pleural effusion unchanged. Right lower lobe airspace disease slightly improved and may represent atelectasis versus pneumonia. Mild left lower lobe atelectasis unchanged. No effusion on the left. IMPRESSION: Progression of interstitial lung markings suggestive of edema. Improved right lower lobe airspace density which may represent atelectasis or infiltrate. Small right effusion unchanged. Electronically Signed   By: Franchot Gallo M.D.   On: 02/03/2016 07:47        Scheduled Meds: . aspirin EC  81 mg Oral Daily  . azithromycin  500 mg Oral Q24H  . cefTRIAXone (ROCEPHIN)  IV  1 g Intravenous Q24H  . docusate sodium  100 mg Oral BID  . ferrous sulfate  325 mg Oral BID WC  . furosemide  80 mg Intravenous Q12H  . hydrALAZINE  50 mg Oral BID  . ipratropium-albuterol  3 mL Nebulization TID  . metoprolol tartrate  25 mg Oral BID  . potassium chloride SA  40 mEq Oral BID  . sodium chloride flush  3 mL Intravenous Q12H  . warfarin  4 mg Oral ONCE-1800  . Warfarin - Pharmacist Dosing Inpatient   Does not apply q1800   Continuous Infusions: . sodium chloride Stopped (02/02/16 1221)  . sodium chloride 10 mL/hr at 02/02/16 1800     LOS: 2 days    Time spent:35 minutes.     Hosie Poisson, MD [facility] Pager [phone]  If 7PM-7AM, please contact night-coverage [email or web address] Password [email or web address] 02/04/2016, 2:41 PM

## 2016-02-05 LAB — BASIC METABOLIC PANEL
ANION GAP: 8 (ref 5–15)
BUN: 8 mg/dL (ref 6–20)
CO2: 26 mmol/L (ref 22–32)
Calcium: 8.8 mg/dL — ABNORMAL LOW (ref 8.9–10.3)
Chloride: 100 mmol/L — ABNORMAL LOW (ref 101–111)
Creatinine, Ser: 0.93 mg/dL (ref 0.61–1.24)
GFR calc Af Amer: >60 mL/min (ref >60)
GFR calc non Af Amer: >60 mL/min (ref >60)
GLUCOSE: 109 mg/dL — AB (ref 65–99)
POTASSIUM: 3.4 mmol/L — AB (ref 3.5–5.1)
Sodium: 134 mmol/L — ABNORMAL LOW (ref 135–145)

## 2016-02-05 LAB — CBC
HEMATOCRIT: 29.1 % — AB (ref 39.0–52.0)
HEMOGLOBIN: 8.1 g/dL — AB (ref 13.0–17.0)
MCH: 17.1 pg — ABNORMAL LOW (ref 26.0–34.0)
MCHC: 27.8 g/dL — ABNORMAL LOW (ref 30.0–36.0)
MCV: 61.5 fL — AB (ref 78.0–100.0)
Platelets: 201 10*3/uL (ref 150–400)
RBC: 4.73 MIL/uL (ref 4.22–5.81)
RDW: 22.2 % — ABNORMAL HIGH (ref 11.5–15.5)
WBC: 6.6 10*3/uL (ref 4.0–10.5)

## 2016-02-05 LAB — PROTIME-INR
INR: 2.39
Prothrombin Time: 26.5 seconds — ABNORMAL HIGH (ref 11.4–15.2)

## 2016-02-05 MED ORDER — FUROSEMIDE 80 MG PO TABS
80.0000 mg | ORAL_TABLET | Freq: Two times a day (BID) | ORAL | Status: DC
Start: 2016-02-05 — End: 2016-02-06
  Administered 2016-02-05 – 2016-02-06 (×2): 80 mg via ORAL
  Filled 2016-02-05 (×2): qty 1

## 2016-02-05 MED ORDER — POTASSIUM CHLORIDE CRYS ER 20 MEQ PO TBCR
40.0000 meq | EXTENDED_RELEASE_TABLET | Freq: Once | ORAL | Status: AC
  Administered 2016-02-05: 40 meq via ORAL
  Filled 2016-02-05: qty 2

## 2016-02-05 MED ORDER — FERROUS SULFATE 325 (65 FE) MG PO TABS
325.0000 mg | ORAL_TABLET | Freq: Two times a day (BID) | ORAL | Status: DC
  Administered 2016-02-06: 325 mg via ORAL
  Filled 2016-02-05: qty 1

## 2016-02-05 MED ORDER — WARFARIN SODIUM 5 MG PO TABS
8.0000 mg | ORAL_TABLET | Freq: Once | ORAL | Status: AC
  Administered 2016-02-05: 8 mg via ORAL
  Filled 2016-02-05: qty 1

## 2016-02-05 NOTE — Progress Notes (Signed)
ANTICOAGULATION CONSULT NOTE - Follow up consult  Pharmacy Consult for Warfarin Indication: atrial fibrillation  No Known Allergies  Patient Measurements: Height: 5\' 11"  (180.3 cm) Weight: 244 lb (110.7 kg) (a scale) IBW/kg (Calculated) : 75.3  Vital Signs: Temp: 98.3 F (36.8 C) (08/04 0628) Temp Source: Oral (08/04 0628) BP: 120/73 (08/04 0628) Pulse Rate: 96 (08/04 0628)  Labs:  Recent Labs  02/02/16 1215  02/03/16 0202 02/04/16 0511 02/05/16 0354  HGB 7.6*  < > 7.1* 7.2* 8.1*  HCT 25.5*  < > 25.7* 26.5* 29.1*  PLT 227  < > 187 202 PENDING  LABPROT 25.2*  --  26.3* 30.9* 26.5*  INR 2.24  --  2.36 2.89 2.39  CREATININE 0.88  --  0.93 0.87 0.93  TROPONINI <0.03  --   --   --   --   < > = values in this interval not displayed.  Estimated Creatinine Clearance: 101.6 mL/min (by C-G formula based on SCr of 0.93 mg/dL).   Medical History: Past Medical History:  Diagnosis Date  . Anemia   . Arthralgia of both knees   . Arthritis    "right knee" (10/01/2015)  . Chronic anticoagulation 2010   Coumadin  . Chronic diastolic CHF (congestive heart failure), NYHA class 2 ([place])   . Hypertension   . OSA (obstructive sleep apnea) 2016   "couldn't take the mask during the testing" (10/01/2015)  . Permanent atrial fibrillation ([place]) 2010  . Pneumonia 09/30/2015    Medications:  Scheduled:  . aspirin EC  81 mg Oral Daily  . azithromycin  500 mg Oral Q24H  . cefTRIAXone (ROCEPHIN)  IV  1 g Intravenous Q24H  . ferrous sulfate  325 mg Oral BID WC  . furosemide  80 mg Intravenous Q12H  . hydrALAZINE  50 mg Oral BID  . ipratropium-albuterol  3 mL Nebulization TID  . metoprolol tartrate  25 mg Oral BID  . polyethylene glycol  17 g Oral Daily  . potassium chloride SA  40 mEq Oral BID  . senna-docusate  2 tablet Oral BID  . sodium chloride flush  3 mL Intravenous Q12H  . warfarin  8 mg Oral ONCE-1800  . Warfarin - Pharmacist Dosing Inpatient   Does not apply q1800     Assessment: 65 year old male with hx of chronic AFib, on coumadin 8 mg daily PTA. INR on admit is therapeutic at 2.24.   INR today remains therapeutic at 2.39 but trended down following lower dose last pm.  CBC stable.   Goal of Therapy:  INR 2-3 Monitor platelets by anticoagulation protocol: Yes   Plan:  1. Give Coumadin 8mg  PO tonight 2. Monitor daily INR, CBC, s/s of bleed  Vincenza Hews, PharmD, BCPS 02/05/2016, 7:31 AM Pager: [phone]

## 2016-02-05 NOTE — Evaluation (Signed)
Physical Therapy Evaluation Patient Details Name: Steven Barnett MRN: [id number] DOB: 09/27/1950 Today's Date: 02/05/2016   History of Present Illness  Steven Barnett is a 65 y.o. 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 He was admitted for CAP AND possible diastolic heart failure.   Clinical Impression  Patient presents with mobility not far from baseline.  Mildly limited due to stiffness and pain in ankles which resolved some during ambulation today and is starting back on home medication regimine.  Feel he is safe to ambulate in hallway with nursing assist and no current follow up PT needs.      Follow Up Recommendations No PT follow up    Equipment Recommendations  None recommended by PT    Recommendations for Other Services       Precautions / Restrictions Precautions Precautions: None      Mobility  Bed Mobility Overal bed mobility: Modified Independent                Transfers Overall transfer level: Modified independent                  Ambulation/Gait Ambulation/Gait assistance: Supervision;Min guard Ambulation Distance (Feet): 150 Feet Assistive device: None Gait Pattern/deviations: Step-through pattern;Trunk flexed;Wide base of support;Decreased stride length     General Gait Details: 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    Stairs            Wheelchair Mobility    Modified Rankin (Stroke Patients Only)       Balance Overall balance assessment: No apparent balance deficits (not formally assessed)                                           Pertinent Vitals/Pain Pain Assessment: Faces Faces Pain Scale: Hurts little  more Pain Location: both ankles L>R Pain Descriptors / Indicators: Aching Pain Intervention(s): Monitored during session;Repositioned    Home Living Family/patient expects to be discharged to:: Private residence Living Arrangements: Children Available Help at Discharge: Family Type of Home: Apartment Home Access: Stairs to enter Entrance Stairs-Rails: Right Entrance Stairs-Number of Steps: 3 Home Layout: One level Home Equipment: None      Prior Function Level of Independence: Independent               Hand Dominance        Extremity/Trunk Assessment   Upper Extremity Assessment: Overall WFL for tasks assessed           Lower Extremity Assessment: Overall WFL for tasks assessed      Cervical / Trunk Assessment: Kyphotic  Communication   Communication: No difficulties  Cognition Arousal/Alertness: Awake/alert Behavior During Therapy: WFL for tasks assessed/performed Overall Cognitive Status: Within Functional Limits for tasks assessed                      General Comments General comments (skin integrity, edema, etc.): Educated in walking program for mobility due to arthritis as well as for general health and weight management.  Reports has lost about 40 lbs. due to increasing veges and decreasing soda and meat.  Educated  in avoiding soups especially at reasaurants due to sodium content.     Exercises Other Exercises Other Exercises: sit<>stand x 5 with minimal UE support      Assessment/Plan    PT Assessment Patent does not need any further PT services  PT Diagnosis Abnormality of gait   PT Problem List    PT Treatment Interventions     PT Goals (Current goals can be found in the Care Plan section) Acute Rehab PT Goals PT Goal Formulation: All assessment and education complete, DC therapy    Frequency     Barriers to discharge        Co-evaluation               End of Session Equipment Utilized During Treatment: Gait  belt Activity Tolerance: Patient tolerated treatment well Patient left: in bed;with call bell/phone within reach           Time: 1357-1420 PT Time Calculation (min) (ACUTE ONLY): 23 min   Charges:   PT Evaluation $PT Eval Moderate Complexity: 1 Procedure PT Treatments $Gait Training: 8-22 mins   PT G CodesReginia Naas 2016-02-20, 2:50 PM  Magda Kiel, [place] 02-20-16

## 2016-02-05 NOTE — Progress Notes (Addendum)
Pt slept well throughout the night, Vitals has been stable, no any specific complain besides cough and cough syrup provided for that, no any other sign of SOB and distress noted, will continue to monitor the patient.

## 2016-02-05 NOTE — Progress Notes (Signed)
PROGRESS NOTE    Steven Barnett  [id number] DOB: 1951-02-11 DOA: 02/02/2016 PCP: Elisabeth Cara, PA-C    Brief Narrative:  Steven Barnett is a 65 y.o. 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 He was admitted for CAP AND possible diastolic heart failure.    Assessment & Plan:   Principal Problem:   Acute on chronic diastolic heart failure (HCC) Active Problems:   Morbid obesity ([place])   Essential hypertension, benign   Permanent atrial fibrillation (HCC)   Acute on chronic diastolic (congestive) heart failure (HCC)   Chronic anticoagulation - Coumadin, CHADS2VASC=2   CAP (community acquired pneumonia)   Acute respiratory failure with hypoxia (HCC)   Anemia, iron deficiency   Acute respiratory failure secondary to possible CAP and acute on chronic diastolic heart failure: Admitted to step down and started on IV antibiotics for the pneumonia and IV lasix 80 mg BID .  Diuresed about 12 liters since admission,  Repeat CXR shows mild pulmonary edema with minimal right pleural effusion, no mention of a pneumonia. Monitor renal parameters while on lasix. His lasix changed from IV  To po today.  He was initially on 2 lit of [place] oxygen, and we were able to wean him off the oxygen.  Strict intake and output and daily weights.  Blood cultures are done and negative so far.  HIV pcr is negative.  Urine for strep pneumoniae is negative.  Sputum cultures ordered , could not be collected.  He has been afebrile and no leukocytosis, and his lactic acid and pro calcitonin have all been negative. Will do a short course of antibiotics for his presumed pneumonia.    Hypertension: controlled.   Chronic atrial fibrillation:: Rate controlled.  Therapeutic INR.  Resume  coumadin as per pharmacy.  Resume BB.   Microcytic ANEMIA; iron deficient.  Baseline hemoglobin is around 9.  Occult stool ordered and pending.  Iron supplements added to his med regimen.  Will probably need a GI consult this admission if his stool is guiac positive.  Still pending.  His repeat hemoglobin is 8.1. Will probably defer the GI work up to outpatient.    Hypokalemia  repleted , repeat level in am.   Hypertension:  Controlled. Resume hydralazine.  And lopressor.   Constipation:  -added stool softeners and laxative.  - resolved.         DVT prophylaxis: (coumadin. Code Status: (Full/) Family Communication: none at bedside, discussed the plan of care with the patient.  Disposition Plan: home tomorrow.    Consultants:   none   Procedures: none   Antimicrobials:R[place]pehin and zithromax 8/1   Subjective: Reports had a good sleep, cough improved.    Objective: Vitals:   02/04/16 2016 02/05/16 0628 02/05/16 0854 02/05/16 1809  BP:  120/73 135/88 113/73  Pulse:  96 96 75  Resp:  20 18 18   Temp:  98.3 F (36.8 C)  97.9 F (36.6 C)  TempSrc:  Oral  Oral  SpO2: 98% 97% 97% 98%  Weight:  110.7 kg (244 lb)    Height:        Intake/Output Summary (Last 24 hours) at 02/05/16 1840 Last data filed at 02/05/16 1808  Gross per 24 hour  Intake  1670 ml  Output             4550 ml  Net            -2880 ml   Filed Weights   02/04/16 0500 02/04/16 1908 02/05/16 0628  Weight: 114.8 kg (253 lb 1.6 oz) 113.1 kg (249 lb 5.4 oz) 110.7 kg (244 lb)    Examination:  General exam: Appears calm and comfortable, off oxygen.  Respiratory system:  Respiratory effort normal. No wheezing heard. Diminished at bases.  No rhonchi. Rales improved.  Cardiovascular system: S1 & S2 heard,irregular,. No JVD, murmurs, rubs, gallops or clicks. No pedal edema. Gastrointestinal system: Abdomen is nondistended, soft and nontender. No organomegaly or masses felt. Normal  bowel sounds heard. Central nervous system: Alert and oriented. No focal neurological deficits. Extremities: Symmetric 5 x 5 power. Skin: No rashes, lesions or ulcers Psychiatry: Judgement and insight appear normal. Mood & affect appropriate.     Data Reviewed: I have personally reviewed following labs and imaging studies  CBC:  Recent Labs Lab 02/02/16 1215 02/02/16 1728 02/03/16 0202 02/04/16 0511 02/05/16 0354  WBC 7.6 6.5 5.1 6.5 6.6  NEUTROABS  --  5.0  --   --   --   HGB 7.6* 7.3* 7.1* 7.2* 8.1*  HCT 25.5* 26.2* 25.7* 26.5* 29.1*  MCV 58.5* 60.9* 60.6* 61.3* 61.5*  PLT 227 196 187 202 [id number]   Basic Metabolic Panel:  Recent Labs Lab 02/02/16 1215 02/03/16 0202 02/04/16 0511 02/05/16 0354  NA 136 133* 134* 134*  K 3.3* 3.3* 3.6 3.4*  CL 100* 100* 99* 100*  CO2 29 26 28 26   GLUCOSE 81 73 77 109*  BUN 12 8 9 8   CREATININE 0.88 0.93 0.87 0.93  CALCIUM 8.7* 8.7* 8.6* 8.8*   GFR: Estimated Creatinine Clearance: 101.6 mL/min (by C-G formula based on SCr of 0.93 mg/dL). Liver Function Tests: No results for input(s): AST, ALT, ALKPHOS, BILITOT, PROT, ALBUMIN in the last 168 hours. No results for input(s): LIPASE, AMYLASE in the last 168 hours. No results for input(s): AMMONIA in the last 168 hours. Coagulation Profile:  Recent Labs Lab 02/02/16 1215 02/03/16 0202 02/04/16 0511 02/05/16 0354  INR 2.24 2.36 2.89 2.39   Cardiac Enzymes:  Recent Labs Lab 02/02/16 1215  TROPONINI <0.03   BNP (last 3 results) No results for input(s): PROBNP in the last 8760 hours. HbA1C: No results for input(s): HGBA1C in the last 72 hours. CBG: No results for input(s): GLUCAP in the last 168 hours. Lipid Profile: No results for input(s): CHOL, HDL, LDLCALC, TRIG, CHOLHDL, LDLDIRECT in the last 72 hours. Thyroid Function Tests: No results for input(s): TSH, T4TOTAL, FREET4, T3FREE, THYROIDAB in the last 72 hours. Anemia Panel: No results for input(s): VITAMINB12,  FOLATE, FERRITIN, TIBC, IRON, RETICCTPCT in the last 72 hours. Sepsis Labs:  Recent Labs Lab 02/02/16 1338 02/02/16 1728 02/04/16 0511  PROCALCITON  --  <0.10 <0.10  LATICACIDVEN 1.24  --   --     Recent Results (from the past 240 hour(s))  Culture, blood (Routine X 2) w Reflex to ID Panel     Status: None (Preliminary result)   Collection Time: 02/02/16 12:15 PM  Result Value Ref Range Status   Specimen Description BLOOD LEFT [facility]  Final   Special Requests BOTTLES DRAWN AEROBIC AND ANAEROBIC 5CC EACH  Final   Culture   Final    NO GROWTH 3 DAYS Performed at [facility]    Report  Status PENDING  Incomplete  Culture, blood (Routine X 2) w Reflex to ID Panel     Status: None (Preliminary result)   Collection Time: 02/02/16  1:30 PM  Result Value Ref Range Status   Specimen Description BLOOD RIGHT [facility]  Final   Special Requests BOTTLES DRAWN AEROBIC AND ANAEROBIC 5CC EACH  Final   Culture   Final    NO GROWTH 3 DAYS Performed at [facility]    Report Status PENDING  Incomplete  MRSA PCR Screening     Status: None   Collection Time: 02/02/16  5:15 PM  Result Value Ref Range Status   MRSA by PCR NEGATIVE NEGATIVE Final    Comment:        The GeneXpert MRSA Assay (FDA approved for NASAL specimens only), is one component of a comprehensive MRSA colonization surveillance program. It is not intended to diagnose MRSA infection nor to guide or monitor treatment for MRSA infections.          Radiology Studies: Dg Chest 2 View  Result Date: 02/04/2016 CLINICAL DATA:  Shortness of breath, cough. EXAM: CHEST  2 VIEW COMPARISON:  Radiograph of February 03, 2016. FINDINGS: Stable cardiomegaly. No pneumothorax is noted. Stable mild interstitial densities are noted which may represent pulmonary edema. Stable minimal right basilar subsegmental atelectasis is noted with minimal right pleural effusion. Bony thorax is unremarkable. IMPRESSION: Stable mild interstitial  densities which may represent pulmonary edema. Stable minimal right basilar subsegmental atelectasis with minimal right pleural effusion. Electronically Signed   By: Marijo Conception, M.D.   On: 02/04/2016 09:26        Scheduled Meds: . aspirin EC  81 mg Oral Daily  . azithromycin  500 mg Oral Q24H  . cefTRIAXone (ROCEPHIN)  IV  1 g Intravenous Q24H  . ferrous sulfate  325 mg Oral BID WC  . furosemide  80 mg Oral BID  . hydrALAZINE  50 mg Oral BID  . ipratropium-albuterol  3 mL Nebulization TID  . metoprolol tartrate  25 mg Oral BID  . polyethylene glycol  17 g Oral Daily  . potassium chloride SA  40 mEq Oral BID  . senna-docusate  2 tablet Oral BID  . sodium chloride flush  3 mL Intravenous Q12H  . Warfarin - Pharmacist Dosing Inpatient   Does not apply q1800   Continuous Infusions: . sodium chloride Stopped (02/02/16 1221)  . sodium chloride 10 mL/hr at 02/02/16 1800     LOS: 3 days    Time spent:35 minutes.     Hosie Poisson, MD [facility] Pager [phone]  If 7PM-7AM, please contact night-coverage [email or web address] Password [facility] 02/05/2016, 6:40 PM

## 2016-02-06 LAB — BASIC METABOLIC PANEL
ANION GAP: 6 (ref 5–15)
BUN: 9 mg/dL (ref 6–20)
CHLORIDE: 100 mmol/L — AB (ref 101–111)
CO2: 28 mmol/L (ref 22–32)
Calcium: 9 mg/dL (ref 8.9–10.3)
Creatinine, Ser: 1.07 mg/dL (ref 0.61–1.24)
GFR calc non Af Amer: >60 mL/min (ref >60)
Glucose, Bld: 86 mg/dL (ref 65–99)
POTASSIUM: 3.9 mmol/L (ref 3.5–5.1)
Sodium: 134 mmol/L — ABNORMAL LOW (ref 135–145)

## 2016-02-06 LAB — PROTIME-INR
INR: 2.05
Prothrombin Time: 23.4 seconds — ABNORMAL HIGH (ref 11.4–15.2)

## 2016-02-06 LAB — CBC
HEMATOCRIT: 32.1 % — AB (ref 39.0–52.0)
HEMOGLOBIN: 9 g/dL — AB (ref 13.0–17.0)
MCH: 17.4 pg — ABNORMAL LOW (ref 26.0–34.0)
MCHC: 28 g/dL — ABNORMAL LOW (ref 30.0–36.0)
MCV: 62.1 fL — ABNORMAL LOW (ref 78.0–100.0)
Platelets: 266 10*3/uL (ref 150–400)
RBC: 5.17 MIL/uL (ref 4.22–5.81)
RDW: 23.3 % — ABNORMAL HIGH (ref 11.5–15.5)
WBC: 6.8 10*3/uL (ref 4.0–10.5)

## 2016-02-06 LAB — PROCALCITONIN: Procalcitonin: <0.1 ng/mL

## 2016-02-06 MED ORDER — GUAIFENESIN-DM 100-10 MG/5ML PO SYRP: 5.0000 mL | ORAL_SOLUTION | ORAL | 0 refills | Status: DC | PRN

## 2016-02-06 MED ORDER — FUROSEMIDE 80 MG PO TABS: 80.0000 mg | ORAL_TABLET | Freq: Two times a day (BID) | ORAL | 1 refills | Status: DC

## 2016-02-06 MED ORDER — AMOXICILLIN-POT CLAVULANATE 875-125 MG PO TABS: 1.0000 | ORAL_TABLET | Freq: Two times a day (BID) | ORAL | 0 refills | Status: DC

## 2016-02-06 MED ORDER — WARFARIN SODIUM 10 MG PO TABS: 10.0000 mg | ORAL_TABLET | Freq: Once | ORAL | Status: DC

## 2016-02-06 MED ORDER — FERROUS SULFATE 325 (65 FE) MG PO TABS: 325.0000 mg | ORAL_TABLET | Freq: Two times a day (BID) | ORAL | 3 refills | Status: DC

## 2016-02-06 NOTE — Progress Notes (Signed)
ANTICOAGULATION CONSULT NOTE - Follow Up Consult  Pharmacy Consult for Coumadin Indication: atrial fibrillation  No Known Allergies  Patient Measurements: Height: 5\' 11"  (180.3 cm) Weight: 244 lb 12.8 oz (111 kg) (scale a) IBW/kg (Calculated) : 75.3  Vital Signs: Temp: 98.8 F (37.1 C) (08/05 0535) Temp Source: Oral (08/05 0535) BP: 116/74 (08/05 0535) Pulse Rate: 110 (08/05 0535)  Labs:  Recent Labs  02/04/16 0511 02/05/16 0354 02/06/16 0359  HGB 7.2* 8.1* 9.0*  HCT 26.5* 29.1* 32.1*  PLT 202 201 266  LABPROT 30.9* 26.5* 23.4*  INR 2.89 2.39 2.05  CREATININE 0.87 0.93  --     Estimated Creatinine Clearance: 101.7 mL/min (by C-G formula based on SCr of 0.93 mg/dL).  Assessment: 65yom continues on coumadin for afib. INR is therapeutic at 2.05 but trending down, likely due to the 4mg  dose given on 8/3. Hgb improved to 9.0 (7.6 on admit). FOBT pending.   Home dose: 8mg  daily  Goal of Therapy:  INR 2-3 Monitor platelets by anticoagulation protocol: Yes   Plan:  1) Increase coumadin to 10mg  x 1 tonight 2) Daily INR 3) Follow up FOBT  Deboraha Sprang 02/06/2016,10:30 AM

## 2016-02-06 NOTE — Progress Notes (Signed)
The patient's tele and IV has been discontinued, discharge instructions reviewed with the patient and the patient verbalize understanding.  The patient is being discharged from unit.

## 2016-02-07 LAB — CULTURE, BLOOD (ROUTINE X 2)
Culture: NO GROWTH
Culture: NO GROWTH

## 2016-02-07 NOTE — Discharge Summary (Signed)
Physician Discharge Summary  Steven Barnett [id number] DOB: 13-Dec-1950 DOA: 02/02/2016  PCP: Elisabeth Cara, PA-C  Admit date: 02/02/2016 Discharge date: 02/07/2016  Admitted  HOme.  Disposition:  Home   Recommendations for Outpatient Follow-up:  1. Follow up with PCP in 1-2 weeks 2. Please obtain BMP/CBC in one week 3. Please follw up with Dr [name] in 1 to 2 weeks.     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 He was admitted for CAP AND possible diastolic heart failure.   Discharge Diagnoses:  Principal Problem:   Acute on chronic diastolic heart failure (HCC) Active Problems:   Morbid obesity ([place])   Essential hypertension, benign   Permanent atrial fibrillation (HCC)   Acute on chronic diastolic (congestive) heart failure (HCC)   Chronic anticoagulation - Coumadin, CHADS2VASC=2   CAP (community acquired pneumonia)   Acute respiratory failure with hypoxia (HCC)   Anemia, iron deficiency  Acute respiratory failure secondary to possible CAP and acute on chronic diastolic heart failure: Admitted to step down and started on IV antibiotics for the pneumonia and IV lasix 80 mg BID .  Diuresed about 12 liters since admission,  Repeat CXR shows mild pulmonary edema with minimal right pleural effusion, no mention of a pneumonia.  His lasix changed from IV  To po and planned to discharged the patient on 80 mg BID.  He was initially on 2 lit of [place] oxygen, and we were able to wean him off the oxygen.  Strict intake and output and daily weights.  Blood cultures are done and negative so far.  HIV pcr is negative.  Urine for  strep pneumoniae is negative.  Sputum cultures ordered , could not be collected.  He has been afebrile and no leukocytosis, and his lactic acid and pro calcitonin have all been negative. Will do a short course of antibiotics for his presumed pneumonia.    Hypertension: controlled.   Chronic atrial fibrillation:: Rate controlled.  Therapeutic INR.  Resume coumadin as per pharmacy.  Resume BB.   Microcytic ANEMIA; iron deficient.  Baseline hemoglobin is around 9.  Occult stool ordered and pending.  Iron supplements added to his med regimen.  Will need gi referral for iron deficiency anemia.    Hypokalemia  repleted , repeat level in am is normal. .   Hypertension:  Controlled. Resume hydralazine.  And lopressor.   Constipation:  -added stool softeners and laxative.  - resolved.         Discharge Instructions  Discharge Instructions    (HEART FAILURE PATIENTS) Call MD:  Anytime you have any of the following symptoms: 1) 3 pound weight gain in 24 hours or 5 pounds in 1 week 2) shortness of breath, with or without a dry hacking cough 3) swelling in the hands, feet or stomach 4) if you have to sleep on extra pillows at night in order to breathe.    Complete by:  As directed   Diet - low sodium heart healthy    Complete by:  As directed   Discharge instructions    Complete by:  As directed   Please follow up with Dr [name] in one week.  You will need referral to Gastroenterology  for evaluation of iron deficiency anemia.       Medication List    TAKE these medications   albuterol 108 (90 Base) MCG/ACT inhaler Commonly known as:  PROVENTIL HFA;VENTOLIN HFA Inhale 2 puffs into the lungs every 6 (six) hours as needed for wheezing or shortness of breath.   amoxicillin-clavulanate 875-125 MG tablet Commonly known as:  AUGMENTIN Take 1 tablet by mouth 2 (two) times daily.   aspirin 81 MG tablet Take 81 mg by mouth daily.   diltiazem 90 MG tablet Commonly  known as:  CARDIZEM Take 1 tablet (90 mg total) by mouth daily.   docusate sodium 100 MG capsule Commonly known as:  COLACE Take 1 capsule (100 mg total) by mouth 2 (two) times daily. For constipation   ferrous sulfate 325 (65 FE) MG tablet Take 1 tablet (325 mg total) by mouth 2 (two) times daily with a meal.   furosemide 80 MG tablet Commonly known as:  LASIX Take 1 tablet (80 mg total) by mouth 2 (two) times daily. What changed:  See the new instructions.   guaiFENesin-dextromethorphan 100-10 MG/5ML syrup Commonly known as:  ROBITUSSIN DM Take 5 mLs by mouth every 4 (four) hours as needed for cough.   hydrALAZINE 50 MG tablet Commonly known as:  APRESOLINE TAKE ONE TABLET BY MOUTH TWICE DAILY   ipratropium 17 MCG/ACT inhaler Commonly known as:  ATROVENT HFA Inhale 2 puffs into the lungs as needed for wheezing.   lisinopril 20 MG tablet Commonly known as:  PRINIVIL,ZESTRIL TAKE ONE TABLET BY MOUTH ONCE DAILY   metoprolol tartrate 25 MG tablet Commonly known as:  LOPRESSOR Take 1 tablet (25 mg total) by mouth 2 (two) times daily.   potassium chloride SA 20 MEQ tablet Commonly known as:  K-DUR,KLOR-CON Take 2 tablets (40 mEq total) by mouth daily.   sildenafil 20 MG tablet Commonly known as:  REVATIO TAKE TWO TO THREE TABLETS BY MOUTH ONCE DAILY AS NEEDED FOR  SEXUAL  ACTIVITY   warfarin 4 MG tablet Commonly known as:  COUMADIN TAKE TWO TABLETS BY MOUTH ONCE DAILY OR AS DIRECTED BY THE COUMADIN CLINIC What changed:  Another medication with the same name was changed. Make sure you understand how and when to take each.   warfarin 4 MG tablet Commonly known as:  COUMADIN Take 2 tablets by mouth daily or as directed by coumadin clinic  Take Coumadin total 12mg  tonight 08/08/15. What changed:  how much to take  how to take this  when to take this  additional instructions      Follow-up Information    FULBRIGHT, VIRGINIA E, PA-C. Schedule an appointment as soon  as possible for a visit in 1 week(s).   Specialty:  Family Medicine Contact information: [id number] [place] [address] [phone]        Pixie Casino, MD. Schedule an appointment as soon as possible for a visit in 1 week(s).   Specialty:  Cardiology Why:  please get BMP at the visit.  Contact information: [address] [place] [address] [phone]          No Known Allergies  Consultations:  NONE   Procedures/Studies: Dg Chest 2 View  Result Date: 02/04/2016 CLINICAL DATA:  Shortness of breath, cough. EXAM: CHEST  2 VIEW COMPARISON:  Radiograph of February 03, 2016. FINDINGS: Stable cardiomegaly. No pneumothorax is noted. Stable mild interstitial densities are noted which may represent pulmonary edema. Stable minimal right  basilar subsegmental atelectasis is noted with minimal right pleural effusion. Bony thorax is unremarkable. IMPRESSION: Stable mild interstitial densities which may represent pulmonary edema. Stable minimal right basilar subsegmental atelectasis with minimal right pleural effusion. Electronically Signed   By: Marijo Conception, M.D.   On: 02/04/2016 09:26   Dg Chest 2 View  Result Date: 02/02/2016 CLINICAL DATA:  Cough, shortness of breath, and chest pressure. EXAM: CHEST  2 VIEW COMPARISON:  09/30/2015 FINDINGS: The cardiac silhouette remains enlarged. There is worsening airspace opacity in the right lung base. There is chronic blunting of the costophrenic angle on the right, however this is slightly more prominent than on the prior study which may reflect a small pleural effusion superimposed on chronic pleural thickening. No pneumothorax. Thoracolumbar spondylosis. IMPRESSION: Increased right basilar airspace opacity concerning for pneumonia. Possible small right pleural effusion. Electronically Signed   By: Logan Bores M.D.   On: 02/02/2016 13:01   Dg Chest Port 1 View  Result Date: 02/03/2016 CLINICAL DATA:  Acute  on chronic diastolic heart failure EXAM: PORTABLE CHEST 1 VIEW COMPARISON:  02/02/2016 FINDINGS: Cardiac enlargement. Pulmonary vascular congestion. Increased interstitial markings especially on the right may represent mild edema. Small right pleural effusion unchanged. Right lower lobe airspace disease slightly improved and may represent atelectasis versus pneumonia. Mild left lower lobe atelectasis unchanged. No effusion on the left. IMPRESSION: Progression of interstitial lung markings suggestive of edema. Improved right lower lobe airspace density which may represent atelectasis or infiltrate. Small right effusion unchanged. Electronically Signed   By: Franchot Gallo M.D.   On: 02/03/2016 07:47       Subjective: No new complaints.   Discharge Exam: Vitals:   02/06/16 1149 02/06/16 1213  BP: 101/65 115/61  Pulse: 84 90  Resp:  20  Temp:  98.7 F (37.1 C)   Vitals:   02/06/16 0535 02/06/16 1000 02/06/16 1149 02/06/16 1213  BP: 116/74 (!) 135/113 101/65 115/61  Pulse: (!) 110 (!) 127 84 90  Resp: 18 18  20   Temp: 98.8 F (37.1 C) 97.7 F (36.5 C)  98.7 F (37.1 C)  TempSrc: Oral Oral  Oral  SpO2: 97% 97%  94%  Weight: 111 kg (244 lb 12.8 oz)     Height:        General: Pt is alert, awake, not in acute distress Cardiovascular: RRR, S1/S2 +, no rubs, no gallops Respiratory: CTA bilaterally, no wheezing, no rhonchi Abdominal: Soft, NT, ND, bowel sounds + Extremities: no edema, no cyanosis    The results of significant diagnostics from this hospitalization (including imaging, microbiology, ancillary and laboratory) are listed below for reference.     Microbiology: Recent Results (from the past 240 hour(s))  Culture, blood (Routine X 2) w Reflex to ID Panel     Status: None   Collection Time: 02/02/16 12:15 PM  Result Value Ref Range Status   Specimen Description BLOOD LEFT [facility]  Final   Special Requests BOTTLES DRAWN AEROBIC AND ANAEROBIC 5CC EACH  Final   Culture    Final    NO GROWTH 5 DAYS Performed at [facility]    Report Status 02/07/2016 FINAL  Final  Culture, blood (Routine X 2) w Reflex to ID Panel     Status: None   Collection Time: 02/02/16  1:30 PM  Result Value Ref Range Status   Specimen Description BLOOD RIGHT [facility]  Final   Special Requests BOTTLES DRAWN AEROBIC AND ANAEROBIC [facility] EACH  Final  Culture   Final    NO GROWTH 5 DAYS Performed at [facility]    Report Status 02/07/2016 FINAL  Final  MRSA PCR Screening     Status: None   Collection Time: 02/02/16  5:15 PM  Result Value Ref Range Status   MRSA by PCR NEGATIVE NEGATIVE Final    Comment:        The GeneXpert MRSA Assay (FDA approved for NASAL specimens only), is one component of a comprehensive MRSA colonization surveillance program. It is not intended to diagnose MRSA infection nor to guide or monitor treatment for MRSA infections.      Labs: BNP (last 3 results)  Recent Labs  08/05/15 1055 09/30/15 1443 02/02/16 1215  BNP 571.9* 599.0* [id number]   Basic Metabolic Panel:  Recent Labs Lab 02/02/16 1215 02/03/16 0202 02/04/16 0511 02/05/16 0354 02/06/16 1123  NA 136 133* 134* 134* 134*  K 3.3* 3.3* 3.6 3.4* 3.9  CL 100* 100* 99* 100* 100*  CO2 29 26 28 26 28   GLUCOSE 81 73 77 109* 86  BUN 12 8 9 8 9   CREATININE 0.88 0.93 0.87 0.93 1.07  CALCIUM 8.7* 8.7* 8.6* 8.8* 9.0   Liver Function Tests: No results for input(s): AST, ALT, ALKPHOS, BILITOT, PROT, ALBUMIN in the last 168 hours. No results for input(s): LIPASE, AMYLASE in the last 168 hours. No results for input(s): AMMONIA in the last 168 hours. CBC:  Recent Labs Lab 02/02/16 1728 02/03/16 0202 02/04/16 0511 02/05/16 0354 02/06/16 0359  WBC 6.5 5.1 6.5 6.6 6.8  NEUTROABS 5.0  --   --   --   --   HGB 7.3* 7.1* 7.2* 8.1* 9.0*  HCT 26.2* 25.7* 26.5* 29.1* 32.1*  MCV 60.9* 60.6* 61.3* 61.5* 62.1*  PLT 196 187 202 201 266   Cardiac Enzymes:  Recent Labs Lab  02/02/16 1215  TROPONINI <0.03   BNP: Invalid input(s): POCBNP CBG: No results for input(s): GLUCAP in the last 168 hours. D-Dimer No results for input(s): DDIMER in the last 72 hours. Hgb A1c No results for input(s): HGBA1C in the last 72 hours. Lipid Profile No results for input(s): CHOL, HDL, LDLCALC, TRIG, CHOLHDL, LDLDIRECT in the last 72 hours. Thyroid function studies No results for input(s): TSH, T4TOTAL, T3FREE, THYROIDAB in the last 72 hours.  Invalid input(s): FREET3 Anemia work up No results for input(s): VITAMINB12, FOLATE, FERRITIN, TIBC, IRON, RETICCTPCT in the last 72 hours. Urinalysis No results found for: COLORURINE, APPEARANCEUR, [place], [place], [place], [place], [place], [place], PROTEINUR, UROBILINOGEN, NITRITE, LEUKOCYTESUR Sepsis Labs Invalid input(s): PROCALCITONIN,  WBC,  LACTICIDVEN Microbiology Recent Results (from the past 240 hour(s))  Culture, blood (Routine X 2) w Reflex to ID Panel     Status: None   Collection Time: 02/02/16 12:15 PM  Result Value Ref Range Status   Specimen Description BLOOD LEFT [facility]  Final   Special Requests BOTTLES DRAWN AEROBIC AND ANAEROBIC 5CC EACH  Final   Culture   Final    NO GROWTH 5 DAYS Performed at [facility]    Report Status 02/07/2016 FINAL  Final  Culture, blood (Routine X 2) w Reflex to ID Panel     Status: None   Collection Time: 02/02/16  1:30 PM  Result Value Ref Range Status   Specimen Description BLOOD RIGHT [facility]  Final   Special Requests BOTTLES DRAWN AEROBIC AND ANAEROBIC 5CC EACH  Final   Culture   Final    NO GROWTH 5 DAYS Performed  at [facility]    Report Status 02/07/2016 FINAL  Final  MRSA PCR Screening     Status: None   Collection Time: 02/02/16  5:15 PM  Result Value Ref Range Status   MRSA by PCR NEGATIVE NEGATIVE Final    Comment:        The GeneXpert MRSA Assay (FDA approved for NASAL specimens only), is one component of a comprehensive MRSA  colonization surveillance program. It is not intended to diagnose MRSA infection nor to guide or monitor treatment for MRSA infections.      Time coordinating discharge: Over 30 minutes  SIGNED:   Hosie Poisson, MD  [facility] 02/07/2016, 11:40 PM Pager   If 7PM-7AM, please contact night-coverage [email or web address] Password [email or web address]

## 2016-02-08 ENCOUNTER — Telehealth: Payer: Self-pay | Admitting: Internal Medicine

## 2016-02-08 NOTE — Telephone Encounter (Signed)
Mailed signed home health certification & plan of care, plan of treatment:   [facility] [address] [place], [place] [address]

## 2016-02-15 ENCOUNTER — Telehealth: Payer: Self-pay | Admitting: Internal Medicine

## 2016-02-15 ENCOUNTER — Other Ambulatory Visit: Payer: Self-pay | Admitting: Gastroenterology

## 2016-02-15 NOTE — Telephone Encounter (Signed)
Received records from [facility] for appointment with Dr [name] on 03/08/16.  Records given to [facility] (medical records) for Dr Lysbeth Penner schedule on 03/08/16. lp

## 2016-02-15 NOTE — Telephone Encounter (Signed)
1. Type of surgery: colonoscopy  2. Date of surgery: 03/04/2016 3. Surgeon: Dr. [name] 4. Medications that need to be held & how long: warfarin - 5 days prior 5. Fax and/or Phone: (f) [phone]  (p) [phone]

## 2016-02-18 NOTE — Telephone Encounter (Signed)
Ok to hold warfarin for colonoscopy for 5 days. Will not need bridging. Restart after.  Dr. Lemmie Evens

## 2016-02-18 NOTE — Telephone Encounter (Signed)
Clearance routed via EPIC to Dr. [name]

## 2016-02-19 ENCOUNTER — Other Ambulatory Visit: Payer: Self-pay | Admitting: Internal Medicine

## 2016-02-19 ENCOUNTER — Other Ambulatory Visit: Payer: Self-pay | Admitting: *Deleted

## 2016-02-19 MED ORDER — WARFARIN SODIUM 4 MG PO TABS: ORAL_TABLET | ORAL | 0 refills | Status: DC

## 2016-02-29 ENCOUNTER — Encounter (HOSPITAL_COMMUNITY): Payer: Self-pay | Admitting: *Deleted

## 2016-02-29 NOTE — Progress Notes (Signed)
Spoke with dr Lyndle Herrlich about 02-04-16 chest xray results, do not need to repeat chest xray results per dr Glennon Mac, unless pt is symptomatic day of procedure 03-04-16

## 2016-03-03 NOTE — H&P (Signed)
  Sherril Croon HPI: At this time the patient denies any problems with nausea, vomiting, fevers, chills, abdominal pain, diarrhea, constipation, hematochezia, melena, GERD, or dysphagia. The patient denies any known family history of colon cancers. No complaints of chest pain, SOB, MI, or sleep apnea. The patient is currently suffering from an URI. He has a history of afib and CHF and he takes coumadin. The patient denies having a prior colonoscopy. There is a family histroy of colon polyps in his first degree relatives.  Past Medical History:  Diagnosis Date  . Anemia   . Arthralgia of both knees   . Arthritis    "right knee" (10/01/2015)  . Chronic anticoagulation 2010   Coumadin  . Chronic diastolic CHF (congestive heart failure), NYHA class 2 ([place])   . Hypertension   . OSA (obstructive sleep apnea) 2016   "couldn't take the mask during the testing" (10/01/2015)  . Permanent atrial fibrillation ([place]) 2010  . Pneumonia 3/29/2017and june 2017    Past Surgical History:  Procedure Laterality Date  . THORACOTOMY Right 2010    Family History  Problem Relation Age of Onset  . Heart disease Father   . Heart failure Father   . Hypertension Mother   . Diabetes Mother   . Hyperlipidemia Mother   . Stroke Maternal Grandmother   . Stroke Sister   . Heart attack Neg Hx     Social History:  reports that he quit smoking about 41 years ago. His smoking use included Cigarettes. He smoked 0.10 packs per day. He has never used smokeless tobacco. He reports that he drinks alcohol. He reports that he uses drugs, including Cocaine and Marijuana.  Allergies: No Known Allergies  Medications: Scheduled: Continuous:  No results found for this or any previous visit (from the past 24 hour(s)).   No results found.  ROS:  As stated above in the HPI otherwise negative.  There were no vitals taken for this visit.    PE: Gen: NAD, Alert and Oriented HEENT:  [place]/AT, EOMI Neck: Supple, no  LAD Lungs: CTA Bilaterally CV: RRR without M/G/R ABM: Soft, NTND, +BS Ext: No C/C/E  Assessment/Plan: 1) Screening colonoscopy.  Shayanne Gomm D 03/03/2016, 7:40 AM

## 2016-03-04 ENCOUNTER — Ambulatory Visit (HOSPITAL_COMMUNITY): Payer: Medicaid Other | Admitting: Anesthesiology

## 2016-03-04 ENCOUNTER — Ambulatory Visit (HOSPITAL_COMMUNITY)
Admission: RE | Admit: 2016-03-04 | Discharge: 2016-03-04 | Disposition: A | Discharge status: Home / Self Care | Payer: Medicaid Other | Source: Ambulatory Visit | Attending: Gastroenterology | Admitting: Gastroenterology

## 2016-03-04 ENCOUNTER — Encounter (HOSPITAL_COMMUNITY)
Admission: RE | Disposition: A | Discharge status: Home / Self Care | Payer: Self-pay | Source: Ambulatory Visit | Attending: Gastroenterology

## 2016-03-04 ENCOUNTER — Encounter (HOSPITAL_COMMUNITY): Payer: Self-pay

## 2016-03-04 DIAGNOSIS — M1711 Unilateral primary osteoarthritis, right knee: Secondary | ICD-10-CM | POA: Diagnosis not present

## 2016-03-04 DIAGNOSIS — Z87891 Personal history of nicotine dependence: Secondary | ICD-10-CM | POA: Diagnosis not present

## 2016-03-04 DIAGNOSIS — I482 Chronic atrial fibrillation: Secondary | ICD-10-CM | POA: Insufficient documentation

## 2016-03-04 DIAGNOSIS — Z1211 Encounter for screening for malignant neoplasm of colon: Secondary | ICD-10-CM | POA: Diagnosis present

## 2016-03-04 DIAGNOSIS — Z7901 Long term (current) use of anticoagulants: Secondary | ICD-10-CM | POA: Insufficient documentation

## 2016-03-04 DIAGNOSIS — G4733 Obstructive sleep apnea (adult) (pediatric): Secondary | ICD-10-CM | POA: Insufficient documentation

## 2016-03-04 DIAGNOSIS — I5032 Chronic diastolic (congestive) heart failure: Secondary | ICD-10-CM | POA: Diagnosis not present

## 2016-03-04 DIAGNOSIS — C180 Malignant neoplasm of cecum: Secondary | ICD-10-CM | POA: Diagnosis not present

## 2016-03-04 DIAGNOSIS — K635 Polyp of colon: Secondary | ICD-10-CM | POA: Diagnosis not present

## 2016-03-04 DIAGNOSIS — I110 Hypertensive heart disease with heart failure: Secondary | ICD-10-CM | POA: Diagnosis not present

## 2016-03-04 HISTORY — PX: COLONOSCOPY WITH PROPOFOL: SHX5780

## 2016-03-04 SURGERY — COLONOSCOPY WITH PROPOFOL
Anesthesia: Monitor Anesthesia Care

## 2016-03-04 MED ORDER — PROPOFOL 10 MG/ML IV BOLUS
INTRAVENOUS | Status: AC
  Filled 2016-03-04: qty 60

## 2016-03-04 MED ORDER — SODIUM CHLORIDE 0.9 % IV SOLN: INTRAVENOUS | Status: DC

## 2016-03-04 MED ORDER — PROPOFOL 10 MG/ML IV BOLUS
INTRAVENOUS | Status: DC | PRN
  Administered 2016-03-04: 30 mg via INTRAVENOUS
  Administered 2016-03-04 (×2): 10 mg via INTRAVENOUS
  Administered 2016-03-04: 20 mg via INTRAVENOUS

## 2016-03-04 MED ORDER — PROPOFOL 500 MG/50ML IV EMUL
INTRAVENOUS | Status: DC | PRN
  Administered 2016-03-04: 150 ug/kg/min via INTRAVENOUS

## 2016-03-04 MED ORDER — FENTANYL CITRATE (PF) 100 MCG/2ML IJ SOLN: 25.0000 ug | INTRAMUSCULAR | Status: DC | PRN

## 2016-03-04 MED ORDER — LACTATED RINGERS IV SOLN
INTRAVENOUS | Status: DC
  Administered 2016-03-04: 1000 mL via INTRAVENOUS

## 2016-03-04 SURGICAL SUPPLY — 21 items

## 2016-03-04 NOTE — Anesthesia Preprocedure Evaluation (Signed)
Anesthesia Evaluation  Patient identified by MRN, date of birth, ID band Patient awake    Reviewed: Allergy & Precautions, H&P , Patient's Chart, lab work & pertinent test results, reviewed documented beta blocker date and time   Airway Mallampati: II  TM Distance: >3 FB Neck ROM: full    Dental no notable dental hx.    Pulmonary sleep apnea , former smoker,    Pulmonary exam normal breath sounds clear to auscultation       Cardiovascular hypertension, Atrial Fibrillation  Rhythm:regular Rate:Normal     Neuro/Psych    GI/Hepatic   Endo/Other    Renal/GU      Musculoskeletal   Abdominal   Peds  Hematology   Anesthesia Other Findings Hypertension    Permanent atrial fibrillation ([place]) 2010  Chronic anticoagulation 2010  Coumadin  Chronic diastolic CHF (congestive heart failure), NYHA class 2 (HCC)   OSA Pneumonia......Marland Kitchen june 2017        Reproductive/Obstetrics                             Anesthesia Physical Anesthesia Plan  ASA: II  Anesthesia Plan: MAC   Post-op Pain Management:    Induction: Intravenous  Airway Management Planned: Mask and Natural Airway  Additional Equipment:   Intra-op Plan:   Post-operative Plan:   Informed Consent: I have reviewed the patients History and Physical, chart, labs and discussed the procedure including the risks, benefits and alternatives for the proposed anesthesia with the patient or authorized representative who has indicated his/her understanding and acceptance.   Dental Advisory Given  Plan Discussed with: CRNA and Surgeon  Anesthesia Plan Comments: (Discussed sedation and potential to need to place airway or ETT if warranted by clinical changes intra-operatively. We will start procedure as MAC.)        Anesthesia Quick Evaluation

## 2016-03-04 NOTE — Op Note (Addendum)
[facility] Patient Name: Steven Barnett Procedure Date: 03/04/2016 MRN: [id number] Attending MD: Carol Ada , MD Date of Birth: [birth year] CSN: [id number] Age: 65 Admit Type: Outpatient Procedure:                Colonoscopy Indications:              Screening for colorectal malignant neoplasm Providers:                Carol Ada, MD, Laverta Baltimore RN, RN,                            William Dalton, Technician Referring MD:              Medicines:                Propofol per Anesthesia Complications:            No immediate complications. Estimated Blood Loss:     Estimated blood loss was minimal. Procedure:                Pre-Anesthesia Assessment:                           - Prior to the procedure, a History and Physical                            was performed, and patient medications and                            allergies were reviewed. The patient's tolerance of                            previous anesthesia was also reviewed. The risks                            and benefits of the procedure and the sedation                            options and risks were discussed with the patient.                            All questions were answered, and informed consent                            was obtained. Prior Anticoagulants: The patient has                            taken Coumadin (warfarin), last dose was 5 days                            prior to procedure. ASA Grade Assessment: III - A                            patient with severe systemic disease. After  reviewing the risks and benefits, the patient was                            deemed in satisfactory condition to undergo the                            procedure.                           After obtaining informed consent, the colonoscope                            was passed under direct vision. Throughout the                            procedure, the patient's blood pressure,  pulse, and                            oxygen saturations were monitored continuously. The                            [id number]) scope was introduced through                            the anus and advanced to the the cecum, identified                            by appendiceal orifice and ileocecal valve. The                            colonoscopy was performed without difficulty. The                            patient tolerated the procedure well. The quality                            of the bowel preparation was good. The ileocecal                            valve, appendiceal orifice, and rectum were                            photographed. Scope In: 11:27:00 AM Scope Out: [id number] Scope Withdrawal Time: 0 hours 15 minutes 10 seconds  Total Procedure Duration: 0 hours 22 minutes 11 seconds  Findings:      A polypoid and ulcerated non-obstructing large mass was found in the       cecum. The mass was non-circumferential. The mass measured three cm in       length. In addition, its diameter measured three mm. No bleeding was       present. This was biopsied with a cold forceps for histology.      A 2 mm polyp was found in the transverse colon. The polyp was sessile.       The polyp was removed with a cold biopsy forceps. Resection  and       retrieval were complete.      A small ascending colon polyp was identified, image #3, near the mass.       This polyp was not removed as it is anticipated that it will be removed       with the mass surgically. 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                           - One 2 mm polyp in the transverse colon, removed                            with a cold biopsy forceps. Resected and retrieved. Moderate Sedation:      N/A- Per Anesthesia Care Recommendation:           - Patient has a contact number available for                            emergencies. The signs and symptoms of potential                             delayed complications were discussed with the                            patient. Return to normal activities tomorrow.                            Written discharge instructions were provided to the                            patient.                           - Resume previous diet.                           - Continue present medications.                           - Await pathology results.                           - Repeat colonoscopy in 1 year for surveillance.                           - Surgical consultation.                           - Imaging pendinng the biopsy results. Procedure Code(s):        --- Professional ---                           [phone], Colonoscopy, flexible; with biopsy, single  or multiple Diagnosis Code(s):        --- Professional ---                           D49.0, Neoplasm of unspecified behavior of                            digestive system                           Z12.11, Encounter for screening for malignant                            neoplasm of colon                           D12.3, Benign neoplasm of transverse colon (hepatic                            flexure or splenic flexure) CPT copyright 2016 [employer]. All rights reserved. The codes documented in this report are preliminary and upon coder review may  be revised to meet current compliance requirements. Carol Ada, MD Carol Ada, MD 03/04/2016 11:57:47 AM This report has been signed electronically. Number of Addenda: 0

## 2016-03-04 NOTE — Transfer of Care (Signed)
Immediate Anesthesia Transfer of Care Note  Patient: Steven Barnett  Procedure(s) Performed: Procedure(s): COLONOSCOPY WITH PROPOFOL (N/A)  Patient Location: PACU  Anesthesia Type:MAC  Level of Consciousness: Patient easily awoken, sedated, comfortable, cooperative, following commands, responds to stimulation.   Airway & Oxygen Therapy: Patient spontaneously breathing, ventilating well, oxygen via simple oxygen mask.  Post-op Assessment: Report given to PACU RN, vital signs reviewed and stable, moving all extremities.   Post vital signs: Reviewed and stable.  Complications: No apparent anesthesia complications  Last Vitals:  Vitals:   03/04/16 1036 03/04/16 1201  BP: 132/87 (!) 102/49  Pulse:  76  Resp:  (!) 26  Temp:      Last Pain:  Vitals:   03/04/16 1201  TempSrc: Oral         Complications: No apparent anesthesia complications

## 2016-03-04 NOTE — Discharge Instructions (Signed)
YOU HAD AN ENDOSCOPIC PROCEDURE TODAY: Refer to the procedure report and other information in the discharge instructions given to you for any specific questions about what was found during the examination. If this information does not answer your questions, please call [facility] at [phone] to clarify.  ° °YOU SHOULD EXPECT: Some feelings of bloating in the abdomen. Passage of more gas than usual. Walking can help get rid of the air that was put into your GI tract during the procedure and reduce the bloating. If you had a lower endoscopy (such as a colonoscopy or flexible sigmoidoscopy) you may notice spotting of blood in your stool or on the toilet paper. Some abdominal soreness may be present for a day or two, also. ° °DIET: Your first meal following the procedure should be a light meal and then it is ok to progress to your normal diet. A half-sandwich or bowl of soup is an example of a good first meal. Heavy or fried foods are harder to digest and may make you feel nauseous or bloated. Drink plenty of fluids but you should avoid alcoholic beverages for 24 hours. If you had an esophageal dilation, please see attached information for diet.  ° °ACTIVITY: Your care partner should take you home directly after the procedure. You should plan to take it easy, moving slowly for the rest of the day. You can resume normal activity the day after the procedure however YOU SHOULD NOT DRIVE, use power tools, machinery or perform tasks that involve climbing or major physical exertion for 24 hours (because of the sedation medicines used during the test).  ° °SYMPTOMS TO REPORT IMMEDIATELY: °A gastroenterologist can be reached at any hour. Please call [phone]  for any of the following symptoms:  °Following lower endoscopy (colonoscopy, flexible sigmoidoscopy) °Excessive amounts of blood in the stool  °Significant tenderness, worsening of abdominal pains  °Swelling of the abdomen that is new, acute  °Fever of  100° or higher  °Following upper endoscopy (EGD, EUS, ERCP, esophageal dilation) °Vomiting of blood or coffee ground material  °New, significant abdominal pain  °New, significant chest pain or pain under the shoulder blades  °Painful or persistently difficult swallowing  °New shortness of breath  °Black, tarry-looking or red, bloody stools ° °FOLLOW UP:  °If any biopsies were taken you will be contacted by phone or by letter within the next 1-3 weeks. Call [phone]  if you have not heard about the biopsies in 3 weeks.  °Please also call with any specific questions about appointments or follow up tests. ° °

## 2016-03-07 NOTE — Anesthesia Postprocedure Evaluation (Signed)
Anesthesia Post Note  Patient: Steven Barnett  Procedure(s) Performed: Procedure(s) (LRB): COLONOSCOPY WITH PROPOFOL (N/A)  Patient location during evaluation: PACU Anesthesia Type: MAC Level of consciousness: awake and alert Pain management: pain level controlled Vital Signs Assessment: post-procedure vital signs reviewed and stable Respiratory status: spontaneous breathing, nonlabored ventilation, respiratory function stable and patient connected to nasal cannula oxygen Cardiovascular status: stable and blood pressure returned to baseline Anesthetic complications: no    Last Vitals:  Vitals:   03/04/16 1036 03/04/16 1201  BP: 132/87 (!) 102/49  Pulse:  76  Resp:  (!) 26  Temp:  36.8 C    Last Pain:  Vitals:   03/04/16 1201  TempSrc: Oral                 Garwood Wentzell EDWARD

## 2016-03-08 ENCOUNTER — Ambulatory Visit: Payer: Medicaid Other | Admitting: Internal Medicine

## 2016-03-08 ENCOUNTER — Telehealth: Payer: Self-pay | Admitting: Internal Medicine

## 2016-03-08 ENCOUNTER — Encounter (HOSPITAL_COMMUNITY): Payer: Self-pay | Admitting: Gastroenterology

## 2016-03-08 NOTE — Telephone Encounter (Signed)
Returned call to daughter (ok per DPR)-wanted to confirm another appt was made since they were unable to make it today.  Advised appt is scheduled for 10/4 @ 330 with MD Hilty at [facility] location.  Verbalized understanding.

## 2016-03-08 NOTE — Telephone Encounter (Signed)
Attempt to return call-no answer, lmtcb. 

## 2016-03-08 NOTE — Telephone Encounter (Signed)
New Message  Pt voiced he is calling about his results from his colonoscopy.  Please follow up with pt. Thanks!

## 2016-03-09 IMAGING — US US RENAL
1 series · 14 of 25 positions shown · non-contrast
Comparison: None.

CLINICAL DATA: Acute renal failure.

EXAM:
RENAL / URINARY TRACT ULTRASOUND COMPLETE

[Series 1: us renal · 0.24mm/px · 14 of 26 slices shown]
[im 1/26]
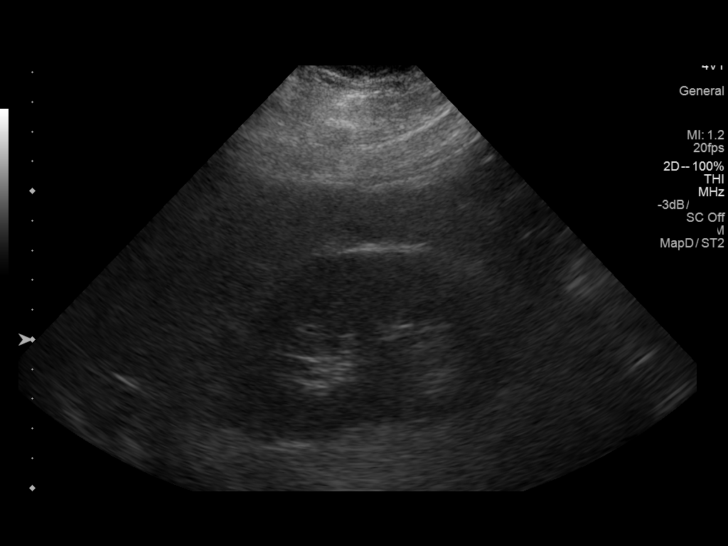
[im 3/26]
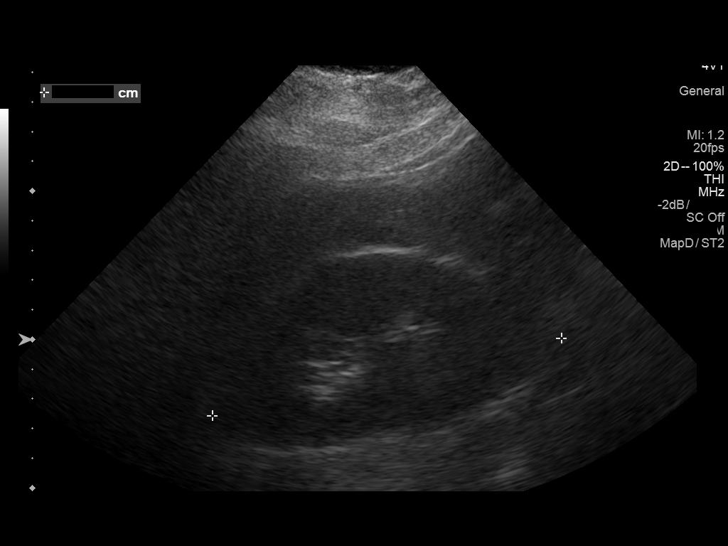
[im 5/26]
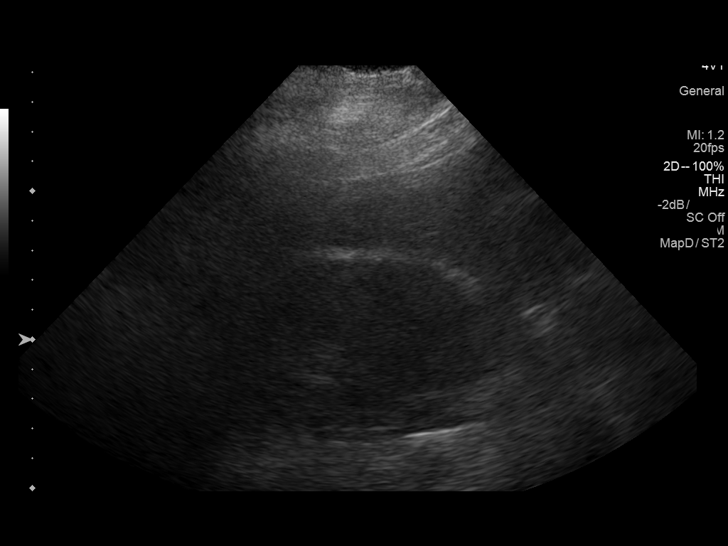
[im 7/26]
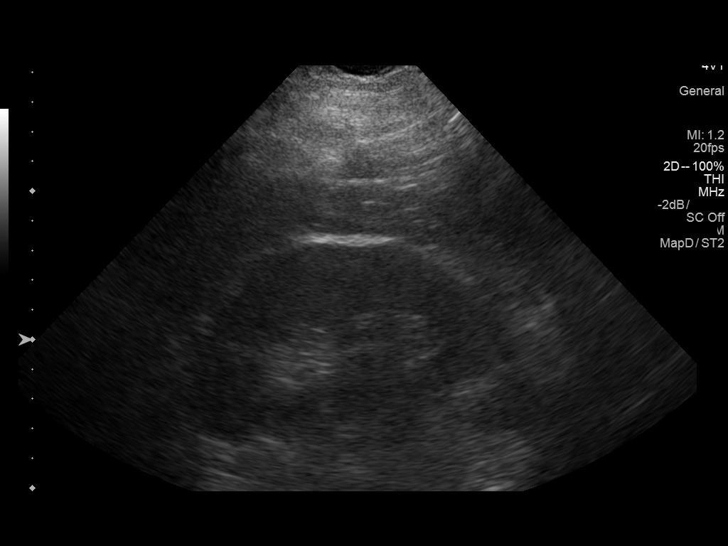
[im 9/26]
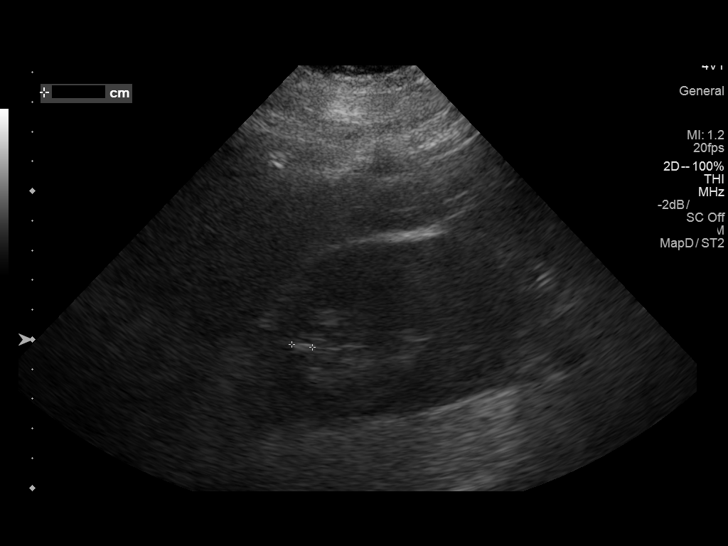
[im 10/26]
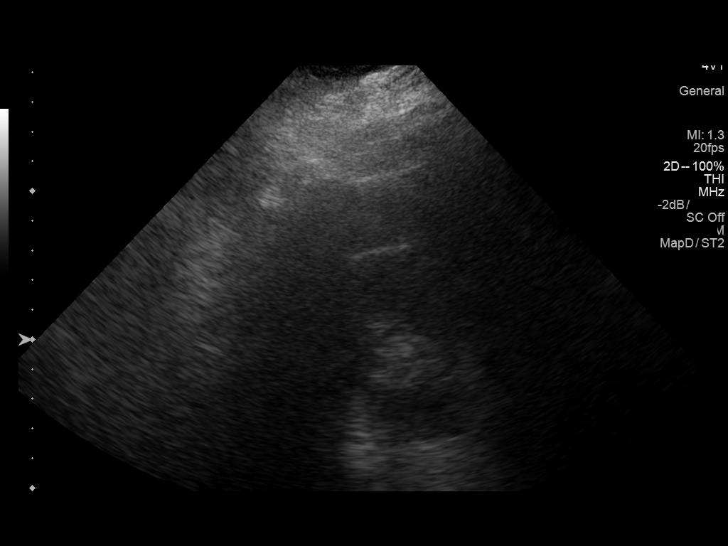
[im 12/26]
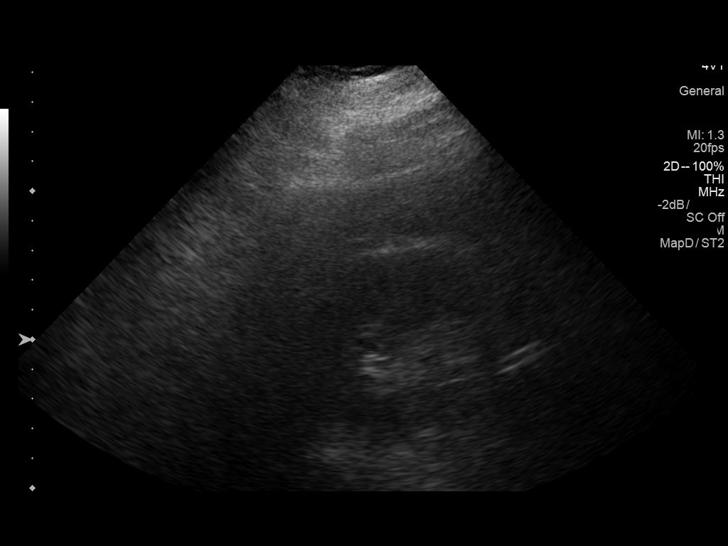
[im 14/26]
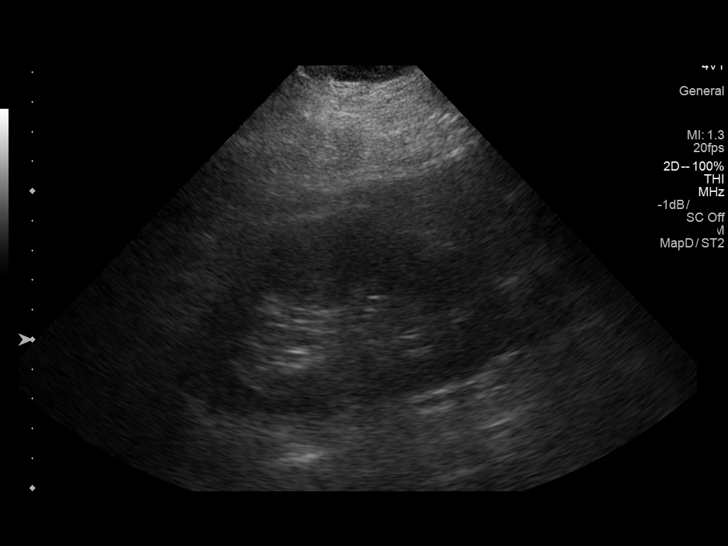
[im 16/26]
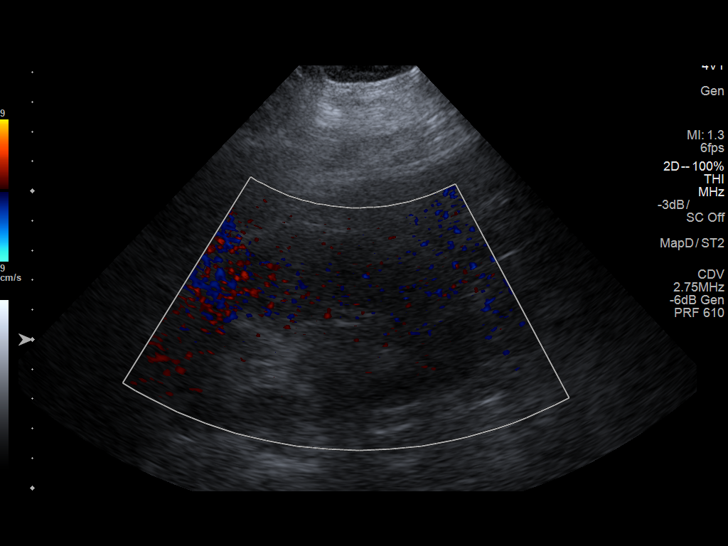
[im 17/26]
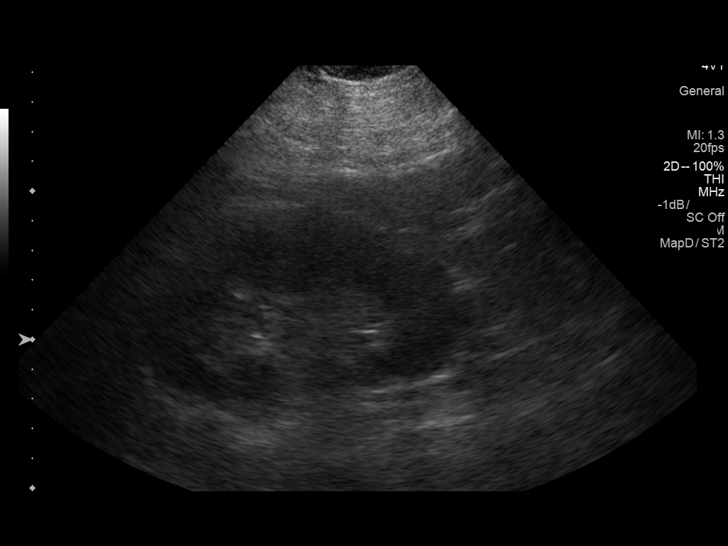
[im 19/26]
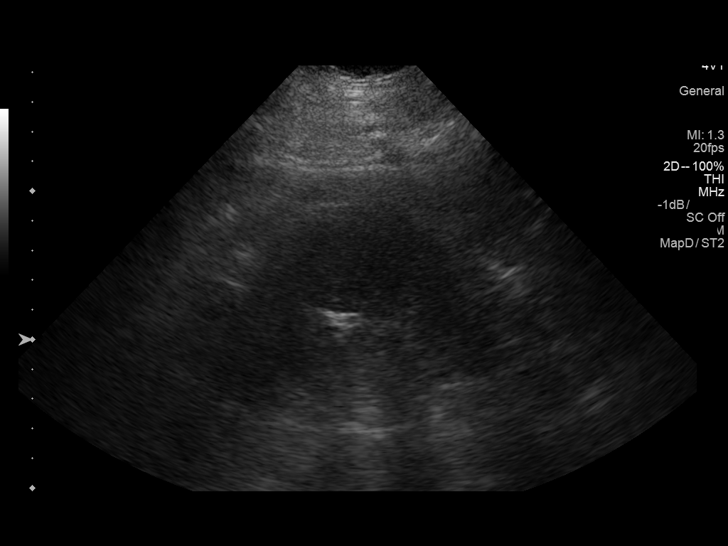
[im 21/26]
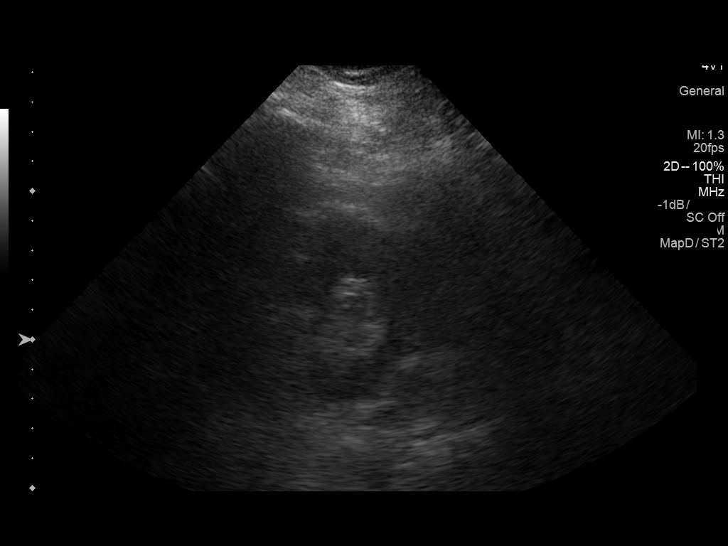
[im 23/26]
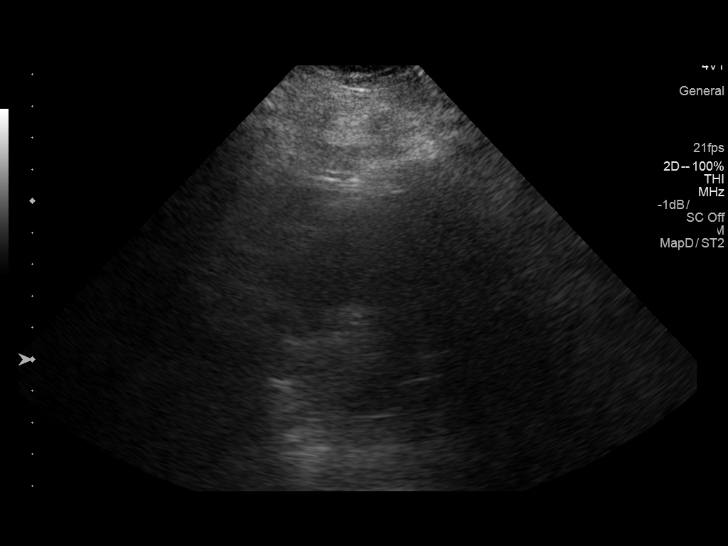
[im 26/26]
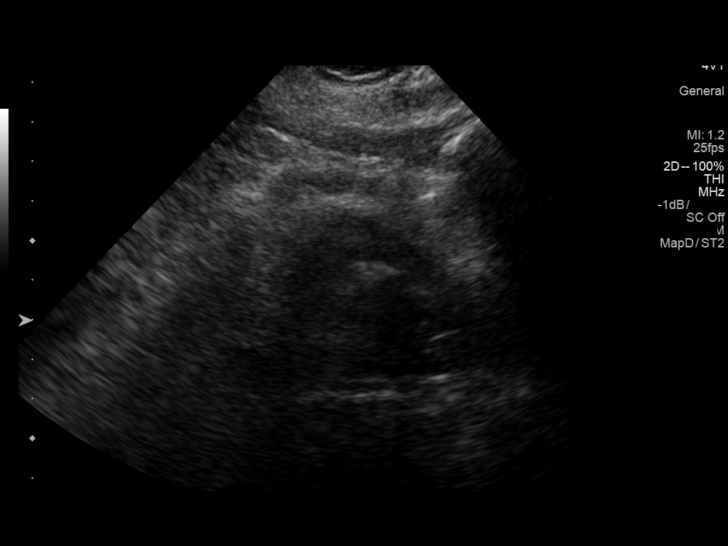

[14 of 25 positions shown; findings below may reference images not displayed]

FINDINGS: Right Kidney:

Length: 12.1 cm. 7 mm nonobstructive calculus is seen in upper pole.
Echogenicity within normal limits. No mass or hydronephrosis
visualized.

Left Kidney:

Length: 11.9 cm. 9 mm calculus is noted in midpole. Echogenicity
within normal limits. No mass or hydronephrosis visualized.

Bladder:

Decompressed secondary to Foley catheter.
IMPRESSION: Bilateral nonobstructive nephrolithiasis. No hydronephrosis is
noted.

## 2016-03-10 ENCOUNTER — Other Ambulatory Visit: Payer: Self-pay | Admitting: Gastroenterology

## 2016-03-10 ENCOUNTER — Telehealth: Payer: Self-pay | Admitting: *Deleted

## 2016-03-10 DIAGNOSIS — R933 Abnormal findings on diagnostic imaging of other parts of digestive tract: Secondary | ICD-10-CM

## 2016-03-10 DIAGNOSIS — C182 Malignant neoplasm of ascending colon: Secondary | ICD-10-CM

## 2016-03-10 NOTE — Telephone Encounter (Signed)
Oncology Nurse Navigator Documentation  Oncology Nurse Navigator Flowsheets 03/10/2016  Navigator Location [facility]  Navigator Encounter Type Introductory phone call  Abnormal Finding Date 03/04/2016  Confirmed Diagnosis Date 03/04/2016  Called daughter, Steven Barnett (per instructions by Dr. [name]): made her aware of the referral and that timing of seeing medical oncology depends upon the CT scan results. If CT is negative, he will have surgery 1st and see medical oncology afterwards. Provided her contact information for navigator and the navigator role. She will call back if her father has not heard about his CT scan appointment by 03/14/16.

## 2016-03-15 ENCOUNTER — Ambulatory Visit
Admission: RE | Admit: 2016-03-15 | Discharge: 2016-03-15 | Disposition: A | Discharge status: Home / Self Care | Payer: Medicaid Other | Source: Ambulatory Visit | Attending: Gastroenterology | Admitting: Gastroenterology

## 2016-03-15 ENCOUNTER — Telehealth: Payer: Self-pay | Admitting: *Deleted

## 2016-03-15 DIAGNOSIS — C182 Malignant neoplasm of ascending colon: Secondary | ICD-10-CM

## 2016-03-15 DIAGNOSIS — R933 Abnormal findings on diagnostic imaging of other parts of digestive tract: Secondary | ICD-10-CM

## 2016-03-15 IMAGING — CT CT CHEST W/ CM
3 of 5 series · 7 of 46 positions shown, 13 images · IV contrast (APPLIED)
Comparison: Chest radiograph of [DATE] ; CT chest from
[DATE]

CLINICAL DATA: Ascending colon cancer, assessment for metastatic
disease, staging workup.

EXAM:
CT CHEST, ABDOMEN, AND PELVIS WITH CONTRAST
TECHNIQUE: Multidetector CT imaging of the chest, abdomen and pelvis was
performed following the standard protocol during bolus
administration of intravenous contrast.
CONTRAST:  100mL [W4] IOPAMIDOL ([W4]) INJECTION 61%

[Series 3: cor · coronal · 0.86mm/px · 3 of 119 slices shown, 4 images]
[im 40/119  soft-tissue]
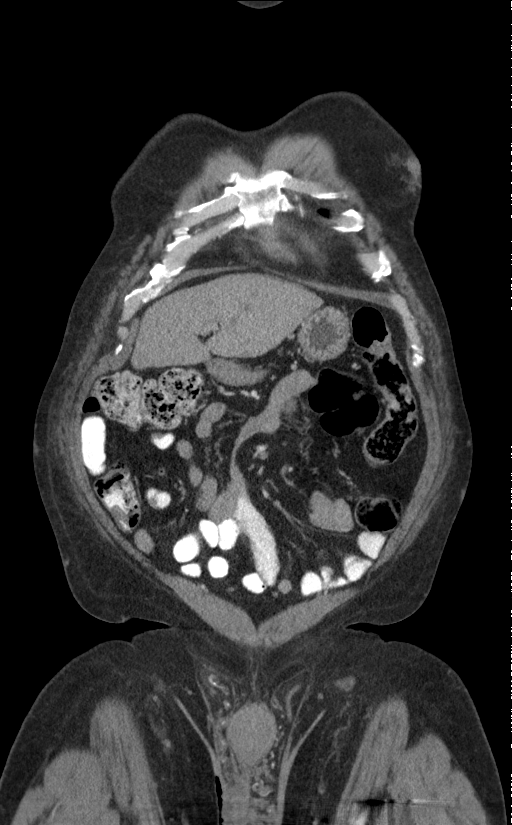
[im 53/119  soft-tissue]
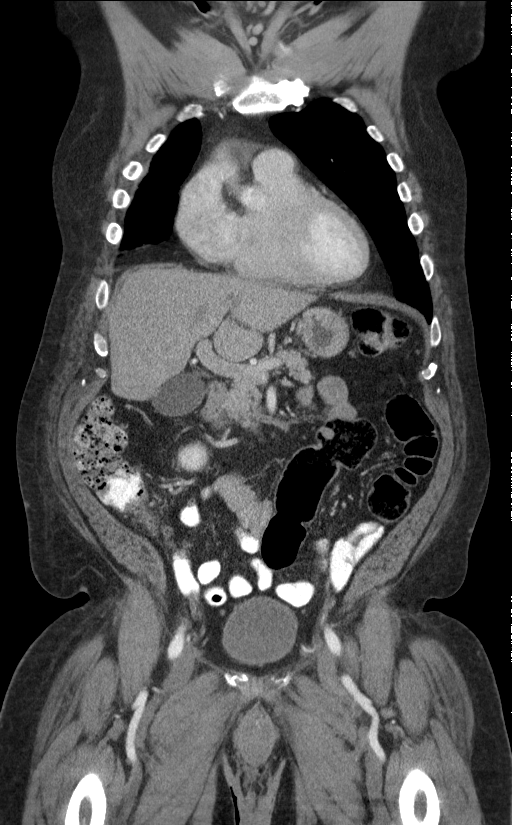
[im 53/119  bone]
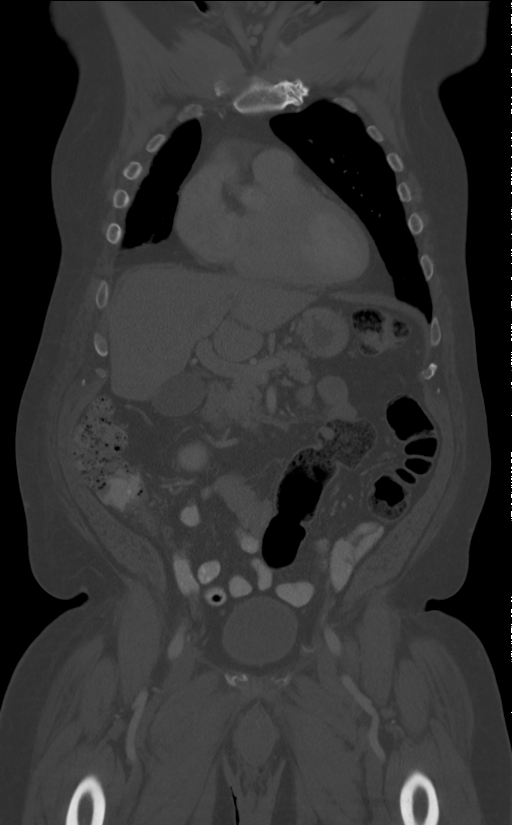
[im 66/119  soft-tissue]
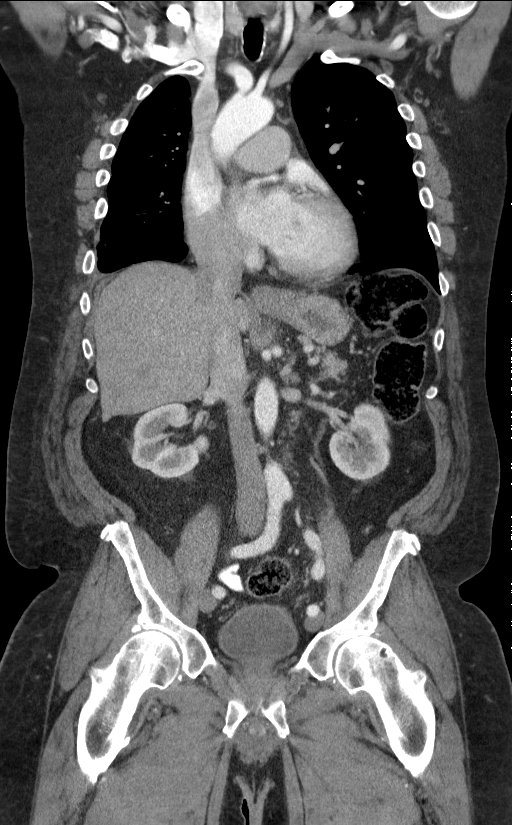

[Series 4: sag · sagittal · 0.72mm/px · 1 of 140 slices shown, 2 images]
[im 47/140  soft-tissue]
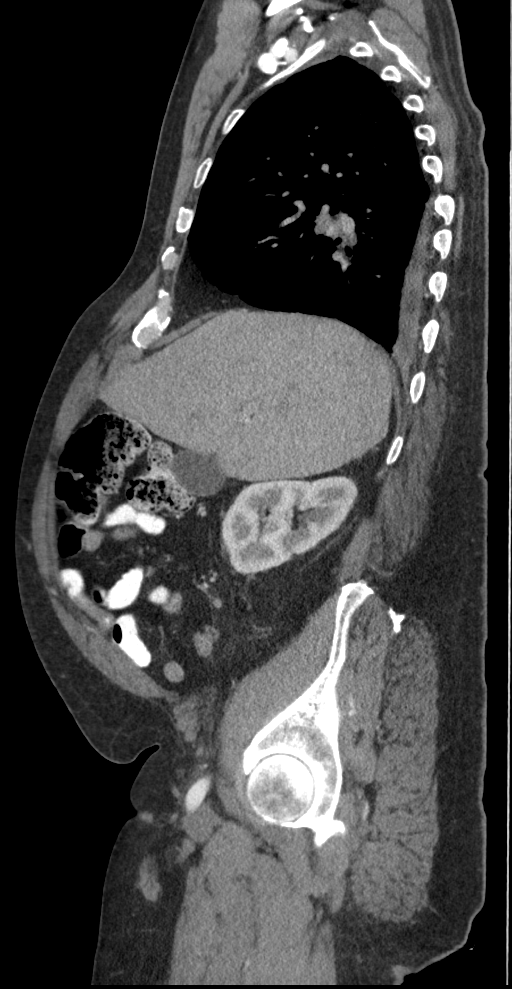
[im 47/140  bone]
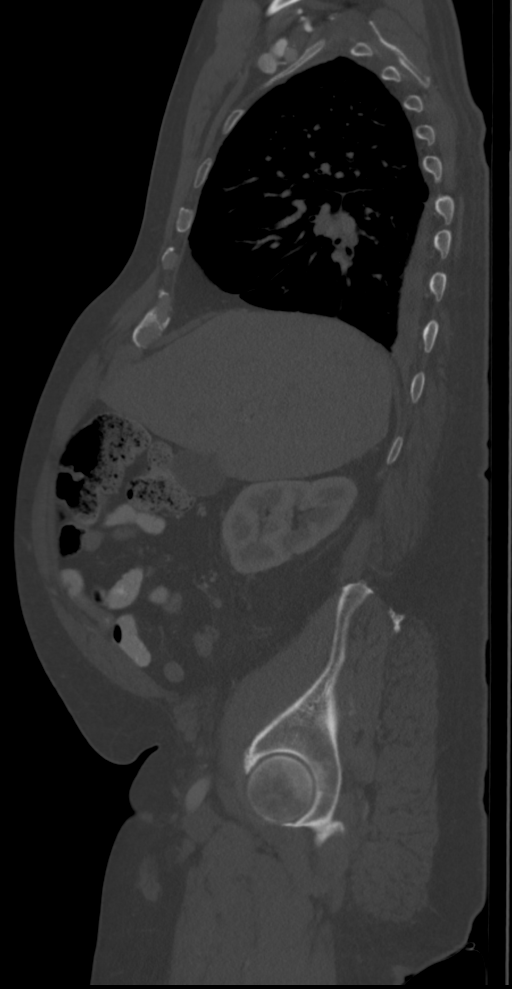

[Series 7: renal delay · axial · delayed · 0.93mm/px · z∈[+772,+977]mm · 3 of 42 slices shown, 7 images]
[im 1/42  soft-tissue]
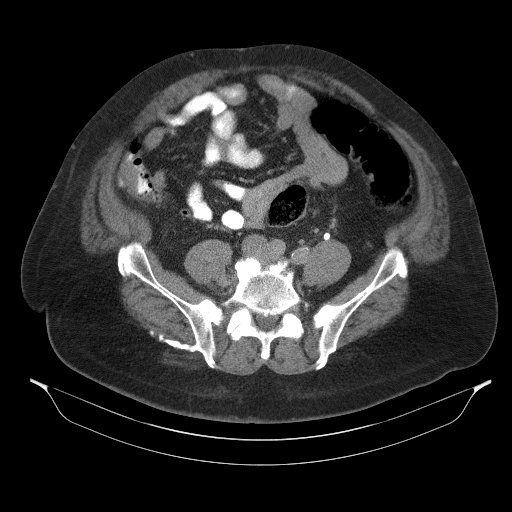
[im 1/42  lung]
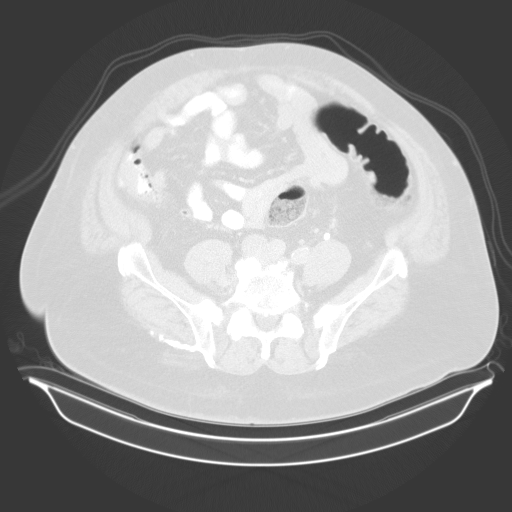
[im 1/42  bone]
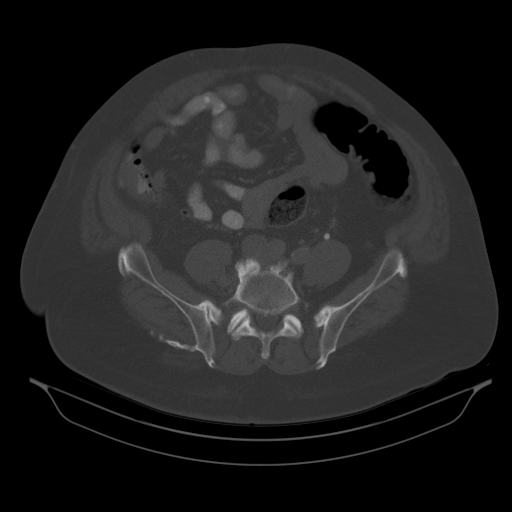
[im 21/42  soft-tissue]
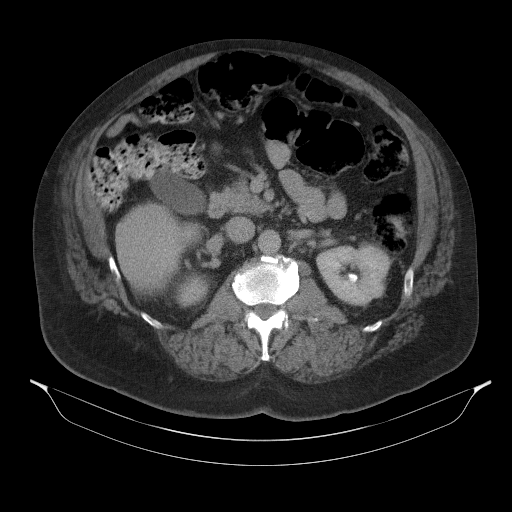
[im 21/42  lung]
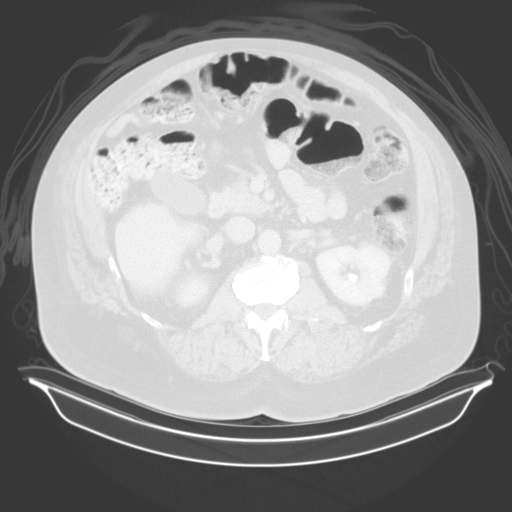
[im 42/42  soft-tissue]
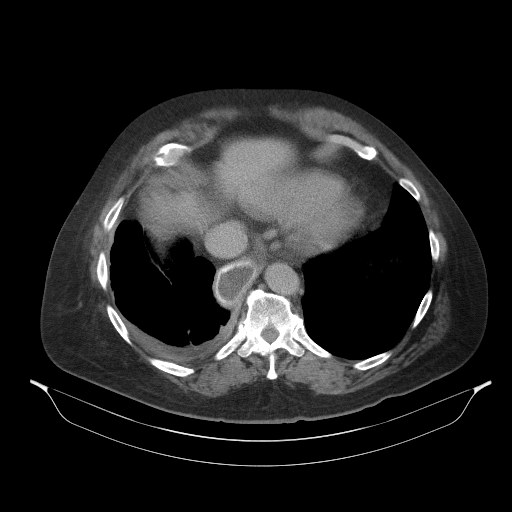
[im 42/42  lung]
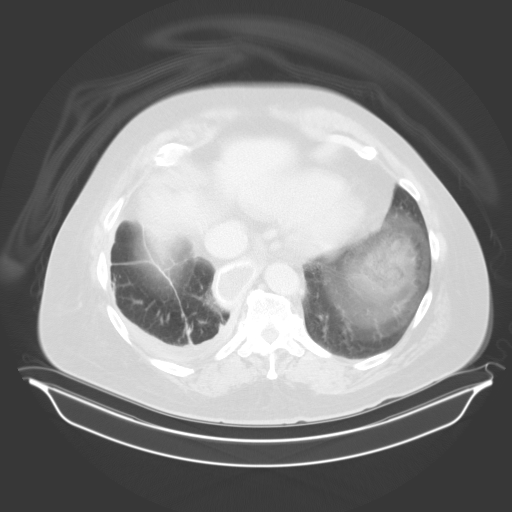

[7 of 46 positions shown; findings below may reference images not displayed]

FINDINGS: CT CHEST FINDINGS

Cardiovascular: Mild atherosclerotic calcification of the aortic
arch. Moderate four-chamber cardiomegaly.

Mediastinum/Nodes: Paratracheal adenopathy including a 1.8 cm in
short axis node on image 33/2, previously 1.5 cm on [DATE].
Right posterior hilar node 1.1 cm in short axis on image 44/2,
previously 1.0 cm. Right infrahilar node 1.1 cm in short axis on
image 57/2, formerly 1.0 cm.

Considerably dilated esophagus diffusely.

Lungs/Pleura: Small exudative right pleural effusion with pleural
thickening on the right, history of prior empyema with
decortication. There is some mild scattered scarring in the right
lung. 3 mm subpleural nodule in the right middle lobe, likely a
subpleural lymph node.

Musculoskeletal: There is some atrophy of the right latissimus
dorsi.

CT ABDOMEN PELVIS FINDINGS

Hepatobiliary: Prominent caudate lobe and lateral segment left
hepatic lobe could be indicators of early cirrhosis. No focal
hepatic lesion identified. Gallbladder unremarkable.

Pancreas: Unremarkable

Spleen: Unremarkable

Adrenals/Urinary Tract: Unremarkable

Stomach/Bowel: Prominent stool throughout the colon favors
constipation. Abnormal polypoid soft tissue prominence in the cecum
as on image 44/3 suspicious for mass. Small adjacent pericecal lymph
nodes are shown for example on image 55/3.

Vascular/Lymphatic: Minimal aortoiliac atherosclerotic vascular
disease.

Reproductive: Unremarkable

Other: No supplemental non-categorized findings.

Musculoskeletal: Left iliopsoas bursitis. Possible varicoceles in
the scrotum bilaterally. Degenerative arthropathy of both hips.
Bridging spurring of both sacroiliac joints. Lumbar spondylosis and
degenerative disc disease causing at least mild bilateral foraminal
impingement at the L4-5 level.
IMPRESSION: 1. Polypoid cecal mass. No findings of liver metastatic disease or
other definite metastatic disease. There are some small adjacent
pericecal lymph nodes which are not pathologically enlarged.
2. Adenopathy in the chest is present but was also present in [W4],
albeit slightly less prominent. This may be reactive adenopathy
related to the chronic exudative right pleural effusion.
3. Prominent dilatation of the esophagus, nonspecific. Causes might
include achalasia, neuropathy, scleroderma, pseudoachalasia,
stricture, presbyesophagus, or Chagas disease.
4. Moderate 4 chamber cardiomegaly.
5. Liver morphology is suggestive but not absolutely diagnostic for
early cirrhosis.
6.  Prominent stool throughout the colon favors constipation.
7. Left iliopsoas bursitis.
8. Possible varicoceles in the scrotum bilaterally.
9. Impingement at L4-5 due to spondylosis and degenerative disc
disease.

## 2016-03-15 MED ORDER — IOPAMIDOL (ISOVUE-300) INJECTION 61%
100.0000 mL | Freq: Once | INTRAVENOUS | Status: AC | PRN
  Administered 2016-03-15: 100 mL via INTRAVENOUS

## 2016-03-15 NOTE — Telephone Encounter (Signed)
  Oncology Nurse Navigator Documentation  Navigator Location: [facility] (03/15/16 1500) Navigator Encounter Type: Telephone (03/15/16 1500) Telephone: Appt Confirmation/Clarification (03/15/16 1500)   Spoke with daughter, Arnav Cregg and provided new patient appointment for 03/18/16 at 10:15 in [facility] [place] to see Dr. [name]. Medical oncology will see him postoperatively.  Reminded to bring insurance cards and a current medication list, including supplements. She verbalizes understanding and confirms the location of the [place] with navigator.

## 2016-03-18 ENCOUNTER — Encounter: Payer: Self-pay | Admitting: *Deleted

## 2016-03-18 ENCOUNTER — Encounter: Payer: Self-pay | Admitting: Physical Therapy

## 2016-03-18 ENCOUNTER — Ambulatory Visit: Payer: Medicaid Other | Admitting: Nutrition

## 2016-03-18 ENCOUNTER — Other Ambulatory Visit: Payer: Self-pay | Admitting: General Surgery

## 2016-03-18 ENCOUNTER — Ambulatory Visit: Payer: Medicaid Other | Attending: Hematology | Admitting: Physical Therapy

## 2016-03-18 DIAGNOSIS — M6281 Muscle weakness (generalized): Secondary | ICD-10-CM | POA: Diagnosis not present

## 2016-03-18 NOTE — Progress Notes (Signed)
Patient was seen in GI clinic.  65 year old male diagnosed with colon cancer.  He is a patient of Dr. Burr Medico.  Past medical history includes anemia, CHF, hypertension, atrial fibrillation, tobacco, alcohol, cocaine and marijuana.  Medications include Colace, ferrous sulfate, Lasix, K-dur, and Coumadin.  Labs were reviewed.  Height: 5 feet 11 inches. Weight: 244 pounds September 1. Usual body weight: 278 pounds January 2017. BMI: 34.03.  Patient reports his weight loss has been intentional.  He states he has decreased his portion sizes, he is eating less salt, and he is choosing healthier foods. He currently denies nutrition impact symptoms. Patient will be having surgery.  Nutrition diagnosis:  Food and nutrition related knowledge deficit related to colon cancer as evidenced by no prior need for nutrition related information.  Intervention: Patient educated to continue healthy plant-based diet with adequate protein for healing. Discouraged rapid late weight loss during treatment and after surgery. Provided fact sheets and answered questions.  Contacted information was provided.  Teach back method was used.  Monitoring, evaluation, goals:  Patient will tolerate a healthy plant-based diet to promote continued healing after surgery.  Next visit:  Patient will contact me for questions or concerns.  **Disclaimer: This note was dictated with voice recognition software. Similar sounding words can inadvertently be transcribed and this note may contain transcription errors which may not have been corrected upon publication of note.**

## 2016-03-18 NOTE — Patient Instructions (Addendum)
Tandem Stance    Can hold onto counter if not feeling steady. Right foot in front of left, heel touching toe both feet "straight ahead". Stand on Foot Triangle of Support with both feet. Balance in this position _30__ seconds. Do with left foot in front of right. 3 times each  Copyright  VHI. All rights reserved.    Stance: single leg on floor. Raise leg. Hold _15__ seconds. Repeat with other leg. __3_ reps per set, _1__ sets per day .  Can hold onto counter to keep steady  Copyright  VHI. All rights reserved.     Try these techniques to guard against pain after abdominal surgery: . Squeeze a pillow to your chest when coughing. . To get out of bed, squeeze a pillow to your chest and roll onto your side first.  Then, push up on your elbow to sit up while still hugging the pillow with the other arm. . To move to the edge of the bed, continue to hug the pillow while you scoot your hips to the edge of the bed. . If you must use your hands to assist with standing up, place your hands on your knees, lean forward, and stand up using your legs primarily.  Place as little weight on your arms as possible. Earlie Counts, [facility] [place], [place], [place] [address], [phone]    Tips for Energy Conservation for Activities of Daily Living . Plan ahead to avoid rushing. . Sit down to bathe and dry off. Wear a terry robe instead of drying off. . Use a shower/bath organizer to decrease leaning and reaching. . Use extension handles on sponges and brushes. Susa Simmonds grab rails in the bathroom or use an elevated toilet seat. Hoyle Barr out clothes and toiletries before dressing. . Minimize leaning over to put on clothes and shoes. Bring your foot to your knee to apply socks and shoes. . Wear comfortable shoes and low-heeled, slip on shoes. Wear button front shirts rather than pullovers. Housekeeping . Schedule household tasks throughout  the week. . Do housework sitting down when possible. . Delegate heavy housework, shopping, laundry and child care when possible. . Drag or slide objects rather than lifting. . Sit when ironing and take rest periods. . Stop working before becoming overly tired. Shopping . Organize list by aisle. . Use a grocery cart for support. Marland Kitchen Shop at less busy times. . Ask for help with getting to the car. Meal Preparation . Use convenience and easy-to-prepare foods. . Use small appliances that take less effort to use. Marland Kitchen Prepare meals sitting down. . Soak dishes instead of scrubbing and let dishes air dry. . Prepare double portions and freeze half. Child Care . Plan activities that can be done sitting down, such as drawing pictures, playing games, reading, and computer games. . Encourage children to climb up onto your lap or into the highchair instead of being lifted. . Make a game of the household chores so that children will want to help. . Delegate child care when possible.  Earlie Counts, PT, [facility]; [place], [place], [place] [address]   Ways to get started on an exercise program 1.  Start for 10 minutes per day with a walking program. 2. Work towards 30 minutes of exercise per day 3. When you do an aerobic exercise program start on a low level 4. Water aerobics is a good  place due to decreased strain on your joints 5. Begin your exercise program gradually and progress slowly over time 6. When exercising use correct form. a. Keep neutral spine b. Engage abdominals c. Keep chest up  d. Chin down e. Do not lock your knees  Earlie Counts, PT [facility] [place], [place], [place] [address] [phone]  The Importance of Exercise as it Relates to [place] suggests that people who have been treated for colon cancer can reduce their risk of the cancer coming back and improve  their odds of survival by as much as 50% if they engage in regular exercise. This is because of the effect exercise has on chemicals in the body that are related to cancer development and progression. Studies have also shown a 24% lower risk of colon cancer in people who are physically active compared to those who are not.  Make it your goal to achieve the American Cancer Society's recommendation to exercise at a moderate to vigorous pace for 30 to 60 minutes at least 5 days a week. You can do this by walking, biking, taking a Zumba class, swimming, or any combination of activities that keeps you moving for that length of time. Not sure how to start an exercise program? . Start small and set reasonable interim goals.  Don't expect to spend hours in the gym. . If you want to change how your body looks, take a "before" picture and look at it from time to time to see if you notice changes in your weight or muscle tone as a result of your workouts. . Find a friend to exercise with.  You can each help the other stay on track and have fun too. . Schedule time to exercise.  It's been found that more people stick with an exercise program if they do it before their workday begins. . Making only one change at a time with your exercise program can reduce the chance of setting yourself up for failure. How to stay motivated . Choose a type of exercise that you enjoy-you'll be more likely to stick with it. Marland Kitchen Keep in mind how good it feels after you have exercised!  . If you are feeling achy, sore, or fatigued, it's okay to limit your exercise that day. Then don't beat yourself up about not doing enough. Instead, feel good about what you were able to do.  . Make a reward system for yourself. . Have a buddy to exercise with; encourage each other. . Enroll in a class so others expect you to be there. While the following suggestions increase overall activity and general health, remember moderate to vigorous exercise is  still important. Here are some ideas for increasing activity: . Go for a walk to help general endurance. . Take the stairs instead of the elevator for endurance, strength, and improving function. . Try a yoga class to work on your flexibility. . Do resistance training with light weights or resistive bands to build strength. . Take frequent breaks throughout the day to stand, stretch, and take short walks.  This can help you feel better both physically and mentally.  Earlie Counts, [place], [place] [address]

## 2016-03-18 NOTE — Progress Notes (Signed)
Oncology Nurse Navigator Documentation  Oncology Nurse Navigator Flowsheets 03/18/2016  Navigator Location [facility]  Navigator Encounter Type Clinic/MDC  Telephone -  Abnormal Finding Date -  Confirmed Diagnosis Date -  Patient Visit Type Surgery  Treatment Phase Pre-Tx/Tx Discussion  Barriers/Navigation Needs Education  International aid/development worker for Upcoming Surgery/ Treatment;Newly Diagnosed Cancer Education  Interventions Education Method  Education Method Verbal;Written  Support Groups/Services GI Support Group;[place];Other--[place] Support Calendar  Acuity Level 2  Time Spent with Patient 30  Met with patient and wife after [place] visit with Dr. [name] Explained the role of the GI Nurse Navigator and provided New Patient Packet with information on: 1. Colon cancer--anatomy of colon, CEA test 2. Support groups 3. Advanced Directives 4. Fall Safety Plan Answered questions, reviewed current treatment plan using TEACH back and provided emotional support. Provided copy of current treatment plan.  Mr. Roedl will be having surgery and navigator informed him that Dr. Burr Medico will see him in medical oncology 2 weeks postop to review his pathology and talk about any further treatment if needed. Explained the staging of cancer and this determines if chemotherapy will be needed or not.  Merceda Elks, RN, BSN GI Oncology [place]

## 2016-03-18 NOTE — Progress Notes (Signed)
[facility] Psychosocial Distress Screening Clinical Social Work GI Clinic   Clinical Social Work met with pt and his wife at [facility] to introduce self, explain role of CSW and to review distress screening protocol.  Clinical Social Work was referred by distress screening protocol.  The patient scored a 6 on the Psychosocial Distress Thermometer which indicates moderate distress. Pt reports to live with his wife and has some extended family close by to assist. Clinical Social Worker reviewed common emotions with new cancer diagnosis. Pt reports he has had some anxiety due to his diagnosis. CSW reviewed coping techniques, GI support group and additional sources of support available. Pt feels his visit today has been very helpful in decreasing his anxiety and he feels much better about his plan.  Pt feels he has a few coping techniques, watching tv and visiting with his grandchildren work well for him and feel his anxiety has decreased after today's visit. Pt reports his sleeping has improved, but that he had trouble sleeping due to concerns about his diagnosis. Pt aware to discuss further with MD/RN as needed. CSW to follow and assist as needed. Pt and family encouraged to reach out as needs arise.   ONCBCN DISTRESS SCREENING 03/18/2016  Screening Type Initial Screening  Distress experienced in past week (1-10) 6  Emotional problem type Depression;Nervousness/Anxiety;Adjusting to illness  Information Concerns Type Lack of info about diagnosis;Lack of info about treatment;Lack of info about complementary therapy choices;Lack of info about maintaining fitness  Physical Problem type Sleep/insomnia  Referral to support programs Yes    Clinical Social Worker follow up needed: No.  If yes, follow up plan:  Loren Racer, [place]  [facility] Phone: [phone] Fax: [phone]

## 2016-03-18 NOTE — Therapy (Signed)
[facility] [address] [place], [place], [address] Phone: [phone]   Fax:  [phone]  Physical Therapy Evaluation  Patient Details  Name: Steven Barnett MRN: [id number] Date of Birth: [birth year] Referring Provider: Dr. Marilynne Halsted  Encounter Date: 03/18/2016      PT End of Session - 03/18/16 1234    Visit Number 1   PT Start Time 1110   PT Stop Time 1124   PT Time Calculation (min) 14 min   Activity Tolerance Patient tolerated treatment well   Behavior During Therapy [facility] for tasks assessed/performed      Past Medical History:  Diagnosis Date  . Anemia   . Arthralgia of both knees   . Arthritis    "right knee" (10/01/2015)  . Chronic anticoagulation 2010   Coumadin  . Chronic diastolic CHF (congestive heart failure), NYHA class 2 ([place])   . Hypertension   . OSA (obstructive sleep apnea) 2016   "couldn't take the mask during the testing" (10/01/2015)  . Permanent atrial fibrillation ([place]) 2010  . Pneumonia 3/29/2017and june 2017    Past Surgical History:  Procedure Laterality Date  . COLONOSCOPY WITH PROPOFOL N/A 03/04/2016   Procedure: COLONOSCOPY WITH PROPOFOL;  Surgeon: Carol Ada, MD;  Location: [facility];  Service: Endoscopy;  Laterality: N/A;  . THORACOTOMY Right 2010    There were no vitals filed for this visit.       Subjective Assessment - 03/18/16 1227    Subjective Patient is attending GI cancer.  He has colon cancer and will have surgery in the future.    Patient is accompained by: Family member  wife   Patient Stated Goals education   Currently in Pain? Yes   Pain Score 7    Pain Location Elbow   Pain Orientation Left   Pain Descriptors / Indicators Sore   Pain Type Chronic pain   Pain Onset More than a month ago   Pain Frequency Intermittent   Aggravating Factors  movement, touching the area   Pain Relieving Factors no movement   Multiple Pain Sites No            [facility] PT Assessment - 03/18/16 0001      Assessment   Medical Diagnosis colon cancer   Referring Provider Dr. Marilynne Halsted   Onset Date/Surgical Date 03/04/16   Prior Therapy None     Precautions   Precautions Other (comment)   Precaution Comments CHF and cancer precautions     Restrictions   Weight Bearing Restrictions No     Balance Screen   Has the patient fallen in the past 6 months No   Has the patient had a decrease in activity level because of a fear of falling?  No   Is the patient reluctant to leave their home because of a fear of falling?  No     Home Environment   Living Environment Private residence   Living Arrangements Spouse/significant other   Available Help at Discharge Family   Type of [place] to enter  on second floor   [place] One level   [place] None     Prior Function   Level of [place] with basic ADLs   Vocation Part time employment   Teacher, adult education     Cognition   Overall Cognitive Status Difficult to assess     Observation/Other Assessments   Focus on Therapeutic Outcomes (FOTO)  Patient limited by 505 due to high risk of falls, CHF, decontioned     Posture/Postural Control   Posture/Postural Control No significant limitations     ROM / Strength   AROM / PROM / Strength Strength     Strength   Overall Strength Comments bil. hip strength 4/5     Balance   Balance Assessed Yes     Standardized Balance Assessment   Standardized Balance Assessment Five Times Sit to Stand   Five times sit to stand comments  26 seconds                           PT Education - 03/18/16 1233    Education provided Yes   Education Details tips to conserve energy, balance exercise, walking program, coughing into a pillow; importance of exercise with colon cancer   Person(s) Educated Spouse;Patient  spouse   Methods Explanation;Demonstration;Handout    Comprehension Returned demonstration;Verbalized understanding             PT Long Term Goals - 03/18/16 1239      PT LONG TERM GOAL #1   Title understand balance exercises and need to buy a single point cane due to risk of falls   Time 1   Period Days   Status Achieved  GI clinic     PT LONG TERM GOAL #2   Title understand the importance of walking program to improve recovery of surgery   Time 1   Period Days   Status Achieved     PT LONG TERM GOAL #3   Title understand how to cough with pillow to decrease abdominal pain after surgery   Time 1   Period Days   Status Achieved     PT LONG TERM GOAL #4   Title understand ways to conserve energy after surgery   Time 1   Period Days   Status Achieved               Plan - 03/18/16 1234    Clinical Impression Statement Patient is a 65 year old male with diagnosis of colon cancer by colonoscopy on 03/04/2016 by Dr. [name].  Patient is presently in GI clinic to see GI team. Patient reports no pain.  Patient reports no falls.  Patient sit to stand in 26 seconds indicating risk of falls. Patient has congested heart failure with feeling of dizziness at times making him a high risk of falls.  Patient is a moderate complex patient with an evolving condition due to history of congested heart failure, hypertension, permanent atrial fibrillation which could affect his rehabilitation.    Rehab Potential Good   Clinical Impairments Affecting Rehab Potential risk of  falls, CHF, atrial fibrilation   PT Frequency 1x / week   PT Duration --  1 time in GI clinic   PT Treatment/Interventions Therapeutic exercise;Patient/family education;Neuromuscular re-education   PT Next Visit Plan Discharge to current HEP   PT Home Exercise Plan Current HEP   Recommended Other Services home health after surgery, shower chair, cane due to risk of falls, tub guard   Consulted and Agree with Plan of Care Patient;Family member/caregiver   Family Member  Consulted wife      Patient will benefit from skilled therapeutic intervention in order to improve the following deficits and impairments:  Decreased balance  Visit Diagnosis: Muscle weakness (generalized) - Plan: PT plan of care cert/re-cert     Problem List Patient Active  Problem List   Diagnosis Date Noted  . CAP (community acquired pneumonia) 02/02/2016  . Acute on chronic diastolic heart failure ([place]) 02/02/2016  . Acute respiratory failure with hypoxia ([place]) 02/02/2016  . Anemia, iron deficiency 02/02/2016  . Long-term (current) use of anticoagulants 12/03/2015  . Pneumonia 09/30/2015  . PNA (pneumonia) 09/30/2015  . Chronic anticoagulation - Coumadin, CHADS2VASC=2 08/05/2015  . Acute on chronic diastolic CHF (congestive heart failure), NYHA class 4 ([place]) 08/05/2015  . Acute on chronic congestive heart failure ([place])   . CHF exacerbation ([place]) 07/08/2015  . Community acquired pneumonia 02/10/2015  . ED (erectile dysfunction) 08/12/2014  . Arthralgia of both knees 04/08/2014  . Acute on chronic diastolic (congestive) heart failure ([place]) 01/14/2009  . Morbid obesity ([place]) 12/31/2008  . Obstructive sleep apnea 12/31/2008  . Essential hypertension, benign 12/31/2008  . Permanent atrial fibrillation ([place]) 12/31/2008  . DIASTOLIC HEART FAILURE, CHRONIC 12/31/2008    Earlie Counts, PT 03/18/16 12:46 PM   [facility] [address], [place], [place], [address] Phone: [phone]   Fax:  [phone]  Name: Steven Barnett MRN: [id number] Date of Birth: 1950/07/09

## 2016-03-28 ENCOUNTER — Other Ambulatory Visit: Payer: Self-pay | Admitting: Internal Medicine

## 2016-03-29 NOTE — Telephone Encounter (Signed)
Rx(s) sent to pharmacy electronically.  

## 2016-03-30 ENCOUNTER — Other Ambulatory Visit: Payer: Self-pay | Admitting: Internal Medicine

## 2016-04-06 ENCOUNTER — Encounter: Payer: Self-pay | Admitting: Internal Medicine

## 2016-04-06 ENCOUNTER — Ambulatory Visit (INDEPENDENT_AMBULATORY_CARE_PROVIDER_SITE_OTHER): Payer: Medicaid Other | Admitting: Pharmacist

## 2016-04-06 ENCOUNTER — Ambulatory Visit (INDEPENDENT_AMBULATORY_CARE_PROVIDER_SITE_OTHER): Payer: Medicaid Other | Admitting: Internal Medicine

## 2016-04-06 VITALS — BP 118/82 | HR 64 | Ht 70.0 in | Wt 253.0 lb

## 2016-04-06 DIAGNOSIS — Z7901 Long term (current) use of anticoagulants: Secondary | ICD-10-CM | POA: Diagnosis not present

## 2016-04-06 DIAGNOSIS — I482 Chronic atrial fibrillation: Secondary | ICD-10-CM

## 2016-04-06 DIAGNOSIS — Z01810 Encounter for preprocedural cardiovascular examination: Secondary | ICD-10-CM

## 2016-04-06 DIAGNOSIS — I10 Essential (primary) hypertension: Secondary | ICD-10-CM

## 2016-04-06 DIAGNOSIS — I4821 Permanent atrial fibrillation: Secondary | ICD-10-CM

## 2016-04-06 DIAGNOSIS — I509 Heart failure, unspecified: Secondary | ICD-10-CM | POA: Diagnosis not present

## 2016-04-06 LAB — POCT INR: INR: 2.7

## 2016-04-06 NOTE — Pre-Procedure Instructions (Signed)
Steven Barnett  04/06/2016      [facility], [address]. [place]. [place] [address] Phone: [phone] Fax: [place], [place] [address] Phone: [phone] Fax: [phone]    Your procedure is scheduled on Tuesday October 10TH   Report to [facility] Admitting at 10:00 A.M.  Call this number if you have problems the morning of surgery:  [phone]   Remember:  Do not eat food or drink liquids after midnight Monday.   Take these medicines the morning of surgery with A SIP OF WATER albuterol (PROVENTIL HFA;VENTOLIN HFA),  diltiazem (CARDIZEM), ipratropium (ATROVENT HFA), metoprolol tartrate (LOPRESSOR),   7 days prior to surgery STOP taking any Aspirin, Aleve, Naproxen, Ibuprofen, Motrin, Advil, Goody's, BC's, all herbal medications, fish oil, and all vitamins     Do not wear jewelry.  Do not wear lotions, powders,cologne, or deoderant.   Men may shave face and neck.  Do not bring valuables to the hospital.  [facility] is not responsible for any belongings or valuables.  Contacts, dentures or bridgework may not be worn into surgery.  Leave your suitcase in the car.  After surgery it may be brought to your room.  For patients admitted to the hospital, discharge time will be determined by your treatment team.   Special instructions:   [facility]- Preparing For Surgery  Before surgery, you can play an important role. Because skin is not sterile, your skin needs to be as free of germs as possible. You can reduce the number of germs on your skin by washing with CHG (chlorahexidine gluconate) Soap before surgery.  CHG is an antiseptic cleaner which kills germs and bonds with the skin to continue killing germs even after washing.  Please do not use if you have an allergy to CHG or antibacterial soaps. If your skin  becomes reddened/irritated stop using the CHG.  Do not shave (including legs and underarms) for at least 48 hours prior to first CHG shower. It is OK to shave your face.  Please follow these instructions carefully.   1. Shower the NIGHT BEFORE SURGERY and the MORNING OF SURGERY with CHG.   2. If you chose to wash your hair, wash your hair first as usual with your normal shampoo.  3. After you shampoo, rinse your hair and body thoroughly to remove the shampoo.  4. Use CHG as you would any other liquid soap. You can apply CHG directly to the skin and wash gently with a scrungie or a clean washcloth.   5. Apply the CHG Soap to your body ONLY FROM THE NECK DOWN.  Do not use on open wounds or open sores. Avoid contact with your eyes, ears, mouth and genitals (private parts). Wash genitals (private parts) with your normal soap.  6. Wash thoroughly, paying special attention to the area where your surgery will be performed.  7. Thoroughly rinse your body with warm water from the neck down.  8. DO NOT shower/wash with your normal soap after using and rinsing off the CHG Soap.  9. Pat yourself dry with a CLEAN TOWEL.   10. Wear CLEAN PAJAMAS   11. Place CLEAN SHEETS on your bed the night of your first shower and DO NOT SLEEP WITH PETS.    Day of Surgery: Do not apply any deodorants/lotions. Please wear clean clothes  to the hospital/surgery center.      Please read over the following fact sheets that you were given. Surgical Site Infection Prevention

## 2016-04-06 NOTE — Patient Instructions (Signed)
Medication Instructions:  Your physician recommends that you continue on your current medications as directed. Please refer to the Current Medication list given to you today.  Labwork: None   Testing/Procedures: None   Follow-Up: Your physician recommends that you schedule a follow-up appointment in: 3 months with Dr Hilty.  Any Other Special Instructions Will Be Listed Below (If Applicable).  If you need a refill on your cardiac medications before your next appointment, please call your pharmacy.  

## 2016-04-06 NOTE — Progress Notes (Signed)
Marland Kitchen    OFFICE NOTE   Chief Complaint:  Follow-up, pre-op  clearance  Primary Care Physician: Elisabeth Cara, PA-C  HPI:  Steven Barnett is a 65 year old morbidly obese African American male with a history significant for hypertension, sleep disorder consistent with obstructive sleep apnea, recent diagnosis of diastolic heart failure with moderate LVH and recent diagnosis of postoperative atrial fibrillation (continues to be in atrial fibrillation) presenting with atypical chest pain. The patient was hospitalized in May 2010 and diagnosed with a right-sided empyema, for which he underwent a thoracotomy. He was found to be in atrial fibrillation postoperatively and has been in this rhythm ever since. At that time, he was also diagnosed with diastolic heart failure and found to have moderate left ventricular hypertrophy by echocardiogram.   Unfortunately, he was incarcerated for the past several years and has not been followed by cardiologist. His warfarin has been continued while in jail however was not adjusted regularly. He recently saw a new primary care provider with [facility] regional physicians and he is here to reestablish cardiac care and followup of his warfarin. He remains in atrial fibrillation today. He reports shortness of breath which is unchanged.  He denies any chest pain. He does report some leg swelling. He has problems with his knees from arthritis and pain. He also has not been able to lose weight. Finally, he has a history of sleep apnea but was never treated for this. He reports poor sleep at night and daytime fatigue and sometimes feels like he doesn't want to fall asleep because he feels like he is going to stop breathing. There is witnessed apnea.  I saw Steven Barnett in the office today. His main complaints are with erectile dysfunction. He is interested in Viagra. Denies any chest pain and reports good sleep. He is therapeutic on warfarin and is in permanent atrial  fibrillation.  Steven Barnett returns to the office today. He is inquiring about getting generic sildenafil as Viagra was not covered. He also wants to be referred for colonoscopy. I told him that would be up to his primary care provider but he said "I thought you were my primary care provider". I clarified the differences in of encouraged him to follow-up with his primary care provider with [facility] physicians. We will go ahead and refer him for screening colonoscopy. He was advised that he need to hold warfarin for least 5 days prior to colonoscopy.  I saw Steven Barnett back in the office today. He was seen this summer by Tarri Fuller, PA-C, for evaluation of cough and shortness of breath. A chest x-ray demonstrated possible pneumonia and was treated with doxycycline. He's not sure if this actually improved his symptoms or not. He recently has been absent for monthly warfarin checks. He says that he was not notified by the office however monthly appointments are scheduled and call also been made. He would like to arrange for a set day for him to come and get his warfarin checked.  Steven Barnett returns today for follow-up. He was recently hospitalized with acute on chronic diastolic heart failure. He had significant weight gain and edema with shortness of breath. He was diuresed and discharged in much better condition. He follows up today in the office. He says that he has not been entirely compliant with his medications, due to some confusion. His blood pressure today is 150/90 with a heart rate of 90. He says he's been taking 25 mg or 1 tablet of Lopressor twice daily  and set of 2 tablets twice daily which was recommended. Weight on discharge was 264 and today is 273 although he measured to 69 at home. He denies any worsening swelling or shortness of breath.  Steven Barnett returns again today for hospital follow-up. He was last seen by myself on March 1 and that was for hospital follow-up. He is now seen again for another  hospital follow-up. Unfortunately he redeveloped a pneumonia and had acute on chronic congestive heart failure. He had significant diuresis and treated with antibiotics and steroids. He was discharged and reports his breathing has improved. He is now on 80 mg Lasix twice a day. He had confusing directions on prednisone. He has 60 mg tablets but his medication list indicated 10 mg tablets. He was advised to taper down, but he did not how to do that with the 60 mg tablets. Currently he is taking 20 mg 3 times a day. Weight is actually 2 pounds lower than discharge.  12/03/2015  Steven Barnett was seen back today in the office for follow-up. His heart rate was elevated today 124 and he reports he ran out of his metoprolol despite there being an active order in the pharmacy. We checked on that today. He apparently is taking diltiazem 90 mg daily. His INR was also recheck today and is subtherapeutic at 1.4. This is concerning for ongoing medication noncompliance. Spiked the increased heart rate he denies any worsening shortness of breath or chest pain.  04/06/2016  Steven Barnett was seen in the office today for follow-up. He is doing much better now that we've reinstituted his medicines. Blood pressure is well-controlled at 118/82 today. Heart rate is in well-controlled at 64 and he is in atrial fibrillation. Warfarin was assessed as well with an INR checked today. Unfortunately he recently was found to have a colorectal cancer by Dr. [name]. He is scheduled for a laparoscopic ileocolectomy by Dr. [name] on 04/12/2016.   PMHx:  Past Medical History:  Diagnosis Date  . Anemia   . Arthralgia of both knees   . Arthritis    "right knee" (10/01/2015)  . Chronic anticoagulation 2010   Coumadin  . Chronic diastolic CHF (congestive heart failure), NYHA class 2 ([place])   . Hypertension   . OSA (obstructive sleep apnea) 2016   "couldn't take the mask during the testing" (10/01/2015)  . Permanent atrial fibrillation ([place]) 2010   . Pneumonia 3/29/2017and june 2017    Past Surgical History:  Procedure Laterality Date  . COLONOSCOPY WITH PROPOFOL N/A 03/04/2016   Procedure: COLONOSCOPY WITH PROPOFOL;  Surgeon: Carol Ada, MD;  Location: [facility];  Service: Endoscopy;  Laterality: N/A;  . THORACOTOMY Right 2010    FAMHx:  Family History  Problem Relation Age of Onset  . Heart disease Father   . Heart failure Father   . Hypertension Mother   . Diabetes Mother   . Hyperlipidemia Mother   . Stroke Maternal Grandmother   . Stroke Sister   . Heart attack Neg Hx     SOCHx:   reports that he quit smoking about 41 years ago. His smoking use included Cigarettes. He smoked 0.10 packs per day. He has never used smokeless tobacco. He reports that he drinks alcohol. He reports that he uses drugs, including Cocaine and Marijuana.  ALLERGIES:  No Known Allergies  ROS: Pertinent items noted in HPI and remainder of comprehensive ROS otherwise negative.  HOME MEDS: Current Outpatient Prescriptions  Medication Sig Dispense Refill  . acetaminophen (  TYLENOL) 500 MG tablet Take 1,000 mg by mouth 2 (two) times daily.    Marland Kitchen albuterol (PROVENTIL HFA;VENTOLIN HFA) 108 (90 Base) MCG/ACT inhaler Inhale 2 puffs into the lungs every 6 (six) hours as needed for wheezing or shortness of breath. 1 Inhaler 2  . aspirin EC 81 MG tablet Take 81 mg by mouth daily.    Marland Kitchen diltiazem (CARDIZEM) 120 MG tablet Take 120 mg by mouth daily.    Marland Kitchen docusate sodium (COLACE) 100 MG capsule Take 1 capsule (100 mg total) by mouth 2 (two) times daily. For constipation 60 capsule 3  . ferrous sulfate 325 (65 FE) MG tablet Take 1 tablet (325 mg total) by mouth 2 (two) times daily with a meal. 60 tablet 3  . furosemide (LASIX) 80 MG tablet Take 1 tablet (80 mg total) by mouth 2 (two) times daily. 60 tablet 1  . guaiFENesin (MUCINEX) 600 MG 12 hr tablet Take 1,200 mg by mouth 2 (two) times daily.    Marland Kitchen guaiFENesin-dextromethorphan (ROBITUSSIN DM) 100-10  MG/5ML syrup Take 5 mLs by mouth every 4 (four) hours as needed for cough. 118 mL 0  . hydrALAZINE (APRESOLINE) 50 MG tablet TAKE ONE TABLET BY MOUTH TWICE DAILY 60 tablet 6  . ipratropium (ATROVENT HFA) 17 MCG/ACT inhaler Inhale 2 puffs into the lungs as needed for wheezing.    Marland Kitchen lisinopril (PRINIVIL,ZESTRIL) 20 MG tablet TAKE ONE TABLET BY MOUTH ONCE DAILY 30 tablet 11  . metoprolol tartrate (LOPRESSOR) 25 MG tablet Take 1 tablet (25 mg total) by mouth 2 (two) times daily. 30 tablet 5  . potassium chloride SA (K-DUR,KLOR-CON) 20 MEQ tablet Take 2 tablets (40 mEq total) by mouth daily. 180 tablet 3  . sildenafil (REVATIO) 20 MG tablet TAKE TWO TO THREE TABLETS BY MOUTH ONCE DAILY AS NEEDED FOR  SEXUAL  ACTIVITY 30 tablet 2  . warfarin (COUMADIN) 4 MG tablet TAKE TWO TABLETS BY MOUTH ONCE DAILY OR  AS  DIRECTED  BY  THE  COUMADIN  CLINIC 60 tablet 0  . warfarin (COUMADIN) 4 MG tablet TAKE TWO TABLETS BY MOUTH ONCE DAILY OR  AS  DIRECTED  BY  THE  COUMADIN  CLINIC 60 tablet 0   No current facility-administered medications for this visit.     LABS/IMAGING: No results found for this or any previous visit (from the past 48 hour(s)). No results found.  VITALS: BP 118/82   Pulse 64   Ht 5\' 10"  (1.778 m)   Wt 253 lb (114.8 kg)   BMI 36.30 kg/m   EXAM: General appearance: alert, no distress and morbidly obese Neck: no carotid bruit and no JVD Lungs: clear to auscultation bilaterally Heart: irregularly irregular rhythm Abdomen: soft, non-tender; bowel sounds normal; no masses,  no organomegaly Extremities: edema trace pitting edema Pulses: 2+ and symmetric Skin: Skin color, texture, turgor normal. No rashes or lesions Neurologic: Grossly normal Psych: Pleasant  EKG: Atrial fibrillation at 64  ASSESSMENT: 1. Permanent atrial fibrillation - anticoagulated on warfarin, CHADSVASC score of 3 2. Morbid obesity 3. Obstructive sleep apnea-not on CPAP 4. Hypertension controlled 5. Leg  edema 6. Bilateral knee osteoarthritis 7. Erectile dysfunction 8. Recent diastolic congestive heart failure 9. Recent pneumonia 10. H/o medication noncompliance  PLAN: 1.   Mr. Livingood is doing better now that his medicines a been reestablished. Heart rate is well-controlled. He is therapeutic on warfarin however will need to discontinue this for his upcoming surgery. He has a preop visit tomorrow and will  advise him to hold his warfarin for at least 5 days prior to his upcoming surgery and October 10. He is acceptable for risk of this surgery. Follow-up with me in 3 months.  Pixie Casino, MD, [facility] Attending Cardiologist [place] 04/06/2016, 3:42 PM

## 2016-04-07 ENCOUNTER — Encounter (HOSPITAL_COMMUNITY): Payer: Self-pay

## 2016-04-07 ENCOUNTER — Encounter (HOSPITAL_COMMUNITY)
Admission: RE | Admit: 2016-04-07 | Discharge: 2016-04-07 | Disposition: A | Discharge status: Home / Self Care | Payer: Medicaid Other | Source: Ambulatory Visit | Attending: General Surgery | Admitting: General Surgery

## 2016-04-07 DIAGNOSIS — Z01818 Encounter for other preprocedural examination: Secondary | ICD-10-CM | POA: Insufficient documentation

## 2016-04-07 HISTORY — DX: Malignant (primary) neoplasm, unspecified: C80.1

## 2016-04-07 LAB — CBC WITH DIFFERENTIAL/PLATELET
BASOS PCT: 1 %
Basophils Absolute: 0.1 10*3/uL (ref 0.0–0.1)
EOS PCT: 2 %
Eosinophils Absolute: 0.2 10*3/uL (ref 0.0–0.7)
HEMATOCRIT: 42.9 % (ref 39.0–52.0)
HEMOGLOBIN: 13 g/dL (ref 13.0–17.0)
Lymphocytes Relative: 16 %
Lymphs Abs: 1.3 10*3/uL (ref 0.7–4.0)
MCH: 23.4 pg — AB (ref 26.0–34.0)
MCHC: 30.3 g/dL (ref 30.0–36.0)
MCV: 77.3 fL — AB (ref 78.0–100.0)
MONO ABS: 0.6 10*3/uL (ref 0.1–1.0)
MONOS PCT: 8 %
NEUTROS PCT: 73 %
Neutro Abs: 5.7 10*3/uL (ref 1.7–7.7)
PLATELETS: 255 10*3/uL (ref 150–400)
RBC: 5.55 MIL/uL (ref 4.22–5.81)
RDW: 22 % — ABNORMAL HIGH (ref 11.5–15.5)
WBC: 7.9 10*3/uL (ref 4.0–10.5)

## 2016-04-07 LAB — PROTIME-INR
INR: 1.8
PROTHROMBIN TIME: 21.1 s — AB (ref 11.4–15.2)

## 2016-04-07 LAB — COMPREHENSIVE METABOLIC PANEL
ALK PHOS: 113 U/L (ref 38–126)
ALT: 14 U/L — AB (ref 17–63)
AST: 20 U/L (ref 15–41)
Albumin: 3.9 g/dL (ref 3.5–5.0)
Anion gap: 8 (ref 5–15)
BILIRUBIN TOTAL: 0.9 mg/dL (ref 0.3–1.2)
BUN: 10 mg/dL (ref 6–20)
CALCIUM: 9.4 mg/dL (ref 8.9–10.3)
CO2: 28 mmol/L (ref 22–32)
CREATININE: 0.97 mg/dL (ref 0.61–1.24)
Chloride: 102 mmol/L (ref 101–111)
Glucose, Bld: 84 mg/dL (ref 65–99)
Potassium: 3.9 mmol/L (ref 3.5–5.1)
Sodium: 138 mmol/L (ref 135–145)
Total Protein: 7.4 g/dL (ref 6.5–8.1)

## 2016-04-07 MED ORDER — CHLORHEXIDINE GLUCONATE CLOTH 2 % EX PADS: 6.0000 | MEDICATED_PAD | Freq: Once | CUTANEOUS | Status: DC

## 2016-04-07 NOTE — Progress Notes (Addendum)
Patient has hx of A-fib.  Sees Dr. Alma Friendly   LOV 04/06/2016  Was told last dose of coumadin was 04/06/2016 PCP is Dr. Titus Mould @ [facility]   [place] 03/2016  Has lost approx 50 lbs since the summer, states he has been dieting and watching salt intake. He has been given instructions to go see [facility] after this visit to pick up prep instructions. He understands that prep will be similar to his colonoscopy  Had sleep study done, and is unable to tolerate the CPAP.  Doesn't use it. Clearance note from Dr. [name] in [place]

## 2016-04-08 LAB — CEA: CEA: 2.7 ng/mL (ref 0.0–4.7)

## 2016-04-08 LAB — HEMOGLOBIN A1C
Hgb A1c MFr Bld: 4.8 % (ref 4.8–5.6)
MEAN PLASMA GLUCOSE: 91 mg/dL

## 2016-04-08 NOTE — Progress Notes (Signed)
Anesthesia Chart Review:  Pt is a 65 year old male scheduled for laparoscopic ileocolectomy on 04/12/2016 with [name], MD.   -Cardiologist is Lyman Bishop, MD, who cleared pt for surgery at last office visit 04/06/16 - PCP is [place], [place] (notes in care everywhere)  PMH includes:  Permanent atrial fibrillation, HTN, chronic diastolic CHF, OSA (no CPAP), anemia, colon cancer. Former smoker. BMI 36. S/p thoracotomy for empyema 2010  Medications include: albuterol, ASA, diltiazem, iron, lasix, hydralazine, atrovent, lisinopril, metoprolol, potassium, sildenafil, coumadin.  Last dose coumadin 04/06/16.   Preoperative labs reviewed.  PT 21.1. Will recheck DOS.   CXR 02/04/16: Stable mild interstitial densities which may represent pulmonary edema. Stable minimal right basilar subsegmental atelectasis with minimal right pleural effusion.  EKG 04/06/16: atrial fibrillation (64 bpm)  Echo 07/09/15:  - Left ventricle: The cavity size was normal. Wall thickness was increased in a pattern of moderate LVH. Systolic function was normal. The estimated ejection fraction was in the range of 55% to 60%. Wall motion was normal; there were no regional wall motion abnormalities. The study is not technically sufficient to allow evaluation of LV diastolic function. - Aortic valve: Trileaflet. Sclerosis without stenosis. There was no regurgitation. - Mitral valve: There was mild regurgitation. - Left atrium: Moderately dilated at 45 ml/m2. - Right ventricle: The cavity size was mildly dilated. - Right atrium: Moderate to severely dilated. - Inferior vena cava: The vessel was normal in size. The respirophasic diameter changes were in the normal range (>= 50%), consistent with normal central venous pressure. - Impressions: Compared to the prior echo in 2010, LVEF remains unchanged.  Nuclear stress test 02/26/15: Low risk study. No reversible ischemia. Non-gated due to atrial fibrillation.  If no changes,  I anticipate pt can proceed with surgery as scheduled.   Willeen Cass, FNP-BC [facility]/Anesthesiology Phone: [phone] 04/08/2016 2:02 PM

## 2016-04-11 MED ORDER — SODIUM CHLORIDE 0.9 % IV SOLN
INTRAVENOUS | Status: DC
  Filled 2016-04-11: qty 6

## 2016-04-11 NOTE — H&P (Signed)
Steven Barnett 03/18/2016 7:32 AM Location: [facility] Patient #: [id number] DOB: 1950-10-06 Undefined / Language: [name] / Race: Black or African American Male   History of Present Illness [name] MD; 03/18/2016 12:57 PM) The patient is a 65 year old male who presents with colorectal cancer. Patient is a 65 year old male who is referred for a new diagnosis of cecal cancer. He was admitted to the hospital in early August with community-acquired pneumonia. He was found to be anemic at that time. He was discharged and referred to GI as an outpatient. He underwent colonoscopy on September 1 and was found to have a polypoid and ulcerated mass in the cecum that was 3 cm in length. There was a small polyp in the transverse colon and a small ascending polyp near the mass. The pathology indicated that the cecal biopsy was adenocarcinoma and the transverse colon polyp was benign colonic mucosa with lymphoid aggregate. He has undergone staging with a CT abdomen and pelvis demonstrating a polypoid cecal mass, no findings of metastatic disease, and some small adjacent pericecal lymph nodes which are not enlarged. He does have some adenopathy-test which has been present since 2010. Labs performed in early August demonstrate normal edema, and a hematocrit of 32.1. Note, the patient is on warfarin anticoagulation due to atrial fibrillation. He has diastolic heart failure but his EF is 55%.  He does not know if he truly has family history of cancer, but does note that multiple other siblings have had colon polyps. Of note, his wife has had a right colectomy in 2006. The patient denies prior abdominal surgery.   CT chest/abd/pelvis IMPRESSION: 1. Polypoid cecal mass. No findings of liver metastatic disease or other definite metastatic disease. There are some small adjacent pericecal lymph nodes which are not pathologically enlarged. 2. Adenopathy in the chest is present but was also present in  2010, albeit slightly less prominent. This may be reactive adenopathy related to the chronic exudative right pleural effusion. 3. Prominent dilatation of the esophagus, nonspecific. Causes might include achalasia, neuropathy, scleroderma, pseudoachalasia, stricture, presbyesophagus, or Chagas disease. 4. Moderate 4 chamber cardiomegaly. 5. Liver morphology is suggestive but not absolutely diagnostic for early cirrhosis. 6. Prominent stool throughout the colon favors constipation. 7. Left iliopsoas bursitis. 8. Possible varicoceles in the scrotum bilaterally. 9. Impingement at L4-5 due to spondylosis and degenerative disc disease.  pathology Diagnosis 1. Colon, biopsy, cecal - ADENOCARCINOMA. 2. Colon, polyp(s), transverse - BENIGN COLONIC MUCOSA WITH BENIGN LYMPHOID AGGREGATE. - NO ADENOMATOUS CHANGE OR MALIGNANCY IDENTIFIED.   Other Problems [name], RN; 03/18/2016 7:33 AM) Arthritis Colon Cancer  Past Surgical History [name], RN; 03/18/2016 7:33 AM) Lung Surgery Right.  Diagnostic Studies History [name], RN; 03/18/2016 7:33 AM) Colonoscopy within last year  Medication History [name], RN; 03/18/2016 7:33 AM) Medications Reconciled  Social History [name], RN; 03/18/2016 7:33 AM) Alcohol use Remotely quit alcohol use. Caffeine use Carbonated beverages. No drug use Tobacco use Never smoker.  Family History [name], RN; 03/18/2016 7:33 AM) Alcohol Abuse Father. Colon Polyps Mother. Hypertension Mother.    Review of Systems [name] RN; 03/18/2016 7:33 AM) General Not Present- Appetite Loss, Chills, Fatigue, Fever, Night Sweats, Weight Gain and Weight Loss. Skin Not Present- Change in Wart/Mole, Dryness, Hives, Jaundice, New Lesions, Non-Healing Wounds, Rash and Ulcer. HEENT Not Present- Earache, Hearing Loss, Hoarseness, Nose Bleed, Oral Ulcers, Ringing in the Ears, Seasonal Allergies, Sinus Pain, Sore  Throat, Visual Disturbances, Wears glasses/contact lenses and Yellow  Eyes. Respiratory Present- Chronic Cough. Not Present- Bloody sputum, Difficulty Breathing, Snoring and Wheezing. Breast Not Present- Breast Mass, Breast Pain, Nipple Discharge and Skin Changes. Cardiovascular Not Present- Chest Pain, Difficulty Breathing Lying Down, Leg Cramps, Palpitations, Rapid Heart Rate, Shortness of Breath and Swelling of Extremities. Gastrointestinal Not Present- Abdominal Pain, Bloating, Bloody Stool, Change in Bowel Habits, Chronic diarrhea, Constipation, Difficulty Swallowing, Excessive gas, Gets full quickly at meals, Hemorrhoids, Indigestion, Nausea, Rectal Pain and Vomiting. Male Genitourinary Not Present- Blood in Urine, Change in Urinary Stream, Frequency, Impotence, Nocturia, Painful Urination, Urgency and Urine Leakage. Musculoskeletal Present- Joint Pain. Not Present- Back Pain, Joint Stiffness, Muscle Pain, Muscle Weakness and Swelling of Extremities. Neurological Not Present- Decreased Memory, Fainting, Headaches, Numbness, Seizures, Tingling, Tremor, Trouble walking and Weakness. Psychiatric Not Present- Anxiety, Bipolar, Change in Sleep Pattern, Depression, Fearful and Frequent crying. Endocrine Not Present- Cold Intolerance, Excessive Hunger, Hair Changes, Heat Intolerance, Hot flashes and New Diabetes. Hematology Present- Blood Thinners. Not Present- Easy Bruising, Excessive bleeding, Gland problems, HIV and Persistent Infections.  Vitals [name] MD; 03/18/2016 12:51 PM) 03/18/2016 12:50 PM Weight: 255.7 lb Height: 70in Body Surface Area: 2.32 m Body Mass Index: 36.69 kg/m  Temp.: 97.20F  Pulse: 66 (Regular)  Resp.: 20 (Unlabored)  P.OX: 97% (Room air) BP: 96/47 (Sitting, Left Arm, Standard)       Physical Exam [name] MD; 03/18/2016 12:58 PM) General Mental Status-Alert. General Appearance-Consistent with stated age. Hydration-Well  hydrated. Voice-Normal.  Head and Neck Head-normocephalic, atraumatic with no lesions or palpable masses. Trachea-midline. Thyroid Gland Characteristics - normal size and consistency.  Eye Eyeball - Bilateral-Extraocular movements intact. Sclera/Conjunctiva - Bilateral-No scleral icterus.  Chest and Lung Exam Chest and lung exam reveals -quiet, even and easy respiratory effort with no use of accessory muscles and on auscultation, normal breath sounds, no adventitious sounds and normal vocal resonance. Inspection Chest Wall - Normal. Back - normal.  Cardiovascular Cardiovascular examination reveals -normal heart sounds, regular rate and rhythm with no murmurs and normal pedal pulses bilaterally.  Abdomen Inspection Inspection of the abdomen reveals - No Hernias. Palpation/Percussion Palpation and Percussion of the abdomen reveal - Soft, Non Tender, No Rebound tenderness, No Rigidity (guarding) and No hepatosplenomegaly. Auscultation Auscultation of the abdomen reveals - Bowel sounds normal. Note: rectus diastasis in the upper abdomen.   Neurologic Neurologic evaluation reveals -alert and oriented x 3 with no impairment of recent or remote memory. Mental Status-Normal. Note: slightly unsteady gait.   Musculoskeletal Global Assessment -Note: no gross deformities.  Normal Exam - Left-Upper Extremity Strength Normal and Lower Extremity Strength Normal. Normal Exam - Right-Upper Extremity Strength Normal and Lower Extremity Strength Normal.  Lymphatic Head & Neck  General Head & Neck Lymphatics: Bilateral - Description - Normal. Axillary  General Axillary Region: Bilateral - Description - Normal. Tenderness - Non Tender. Femoral & Inguinal  Generalized Femoral & Inguinal Lymphatics: Bilateral - Description - No Generalized lymphadenopathy.    Assessment & Plan [name] MD; 03/18/2016 1:02 PM) CECAL CANCER (C18.0) Impression: Patient  has a new diagnosis of cecal cancer with associated chronic blood loss anemia. I reviewed surgery with the patient. I discussed that we would take out the end of the small intestine, the ileocecal valve, and the ascending colon. I discussed that the reason we need to remove so much is because of the lymphatics and blood supply. I discussed that this is what provides [place] with staging in order to assess if he needs additional treatment. I reviewed the  risks of surgery including bleeding, infection, damage to adjacent structures, possible need for additional surgeries, possible ostomy, possible anastomotic leak, possible heart or lung complications with high risk of having postoperative rapid treatment atrial fibrillation, possible death, possible wound infection with need for dressing changes. I would attempt to do this laparoscopically robotically. The patient has not had prior surgeries in the mass is not extremely large. I think this would benefit his postoperative recovery. He was evaluated by physical therapy and will need postoperative PT. He was recommended to buy cane and do balance exercises preoperatively as well as walking. We'll get this scheduled at the first available opportunity.  45 min spent in evaluation, examination, counseling, and coordination of care. >50% spent in counseling. Current Plans Pt Education - [facility] Free Text Education/Instructions: discussed with patient and provided information. Schedule for Surgery Pt Education - [facility] Colon Bowel Prep 2015 Miralax/Antibiotics Started Neomycin Sulfate 500MG , 2 (two) Tablet SEE NOTE, #6, 03/18/2016, No Refill. Local Order: TAKE TWO TABLETS AT 2 PM, 3 PM, AND 10 PM THE DAY PRIOR TO SURGERY Started Flagyl 500MG , 2 (two) Tablet SEE NOTE, #6, 03/18/2016, No Refill. Local Order: Take at 2pm, 3pm, and 10pm the day prior to your colon operation FAMILY HISTORY OF POLYPS IN THE COLON (Z83.71) Impression: Have contacted genetics to see if he qualifies  for testing. CHRONIC DIASTOLIC HEART FAILURE (Y17.49) Impression: Have contacted Dr. [name] to see if he needs additional testing for risk stratification or for medication optimization. ATRIAL FIBRILLATION, PERMANENT (I48.2) ESSENTIAL HYPERTENSION (I10) CHRONIC ANTICOAGULATION (Z79.01) Impression: Have contacted Dr. [name] regarding holding coumadin. Will probably OK since he was able to hold for colonoscopy. will make sure no bridging is needed. 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    Signed by [name], MD (03/18/2016 1:02 PM)

## 2016-04-12 ENCOUNTER — Inpatient Hospital Stay (HOSPITAL_COMMUNITY): Payer: Medicaid Other | Admitting: Emergency Medicine

## 2016-04-12 ENCOUNTER — Encounter (HOSPITAL_COMMUNITY)
Admission: RE | Disposition: A | Discharge status: Home / Self Care | Payer: Self-pay | Source: Ambulatory Visit | Attending: General Surgery

## 2016-04-12 ENCOUNTER — Encounter (HOSPITAL_COMMUNITY): Payer: Self-pay | Admitting: Surgery

## 2016-04-12 ENCOUNTER — Telehealth: Payer: Self-pay | Admitting: *Deleted

## 2016-04-12 ENCOUNTER — Inpatient Hospital Stay (HOSPITAL_COMMUNITY)
Admission: RE | Admit: 2016-04-12 | Discharge: 2016-04-16 | DRG: 330 | Disposition: A | Discharge status: Home / Self Care | Payer: Medicaid Other | Source: Ambulatory Visit | Attending: General Surgery | Admitting: General Surgery

## 2016-04-12 DIAGNOSIS — Z811 Family history of alcohol abuse and dependence: Secondary | ICD-10-CM | POA: Diagnosis not present

## 2016-04-12 DIAGNOSIS — Z8249 Family history of ischemic heart disease and other diseases of the circulatory system: Secondary | ICD-10-CM

## 2016-04-12 DIAGNOSIS — Z7901 Long term (current) use of anticoagulants: Secondary | ICD-10-CM

## 2016-04-12 DIAGNOSIS — Z8371 Family history of colonic polyps: Secondary | ICD-10-CM | POA: Diagnosis not present

## 2016-04-12 DIAGNOSIS — I5032 Chronic diastolic (congestive) heart failure: Secondary | ICD-10-CM | POA: Diagnosis present

## 2016-04-12 DIAGNOSIS — C180 Malignant neoplasm of cecum: Secondary | ICD-10-CM | POA: Diagnosis present

## 2016-04-12 DIAGNOSIS — K567 Ileus, unspecified: Secondary | ICD-10-CM | POA: Diagnosis present

## 2016-04-12 DIAGNOSIS — D500 Iron deficiency anemia secondary to blood loss (chronic): Secondary | ICD-10-CM | POA: Diagnosis present

## 2016-04-12 DIAGNOSIS — D123 Benign neoplasm of transverse colon: Secondary | ICD-10-CM | POA: Diagnosis present

## 2016-04-12 DIAGNOSIS — C19 Malignant neoplasm of rectosigmoid junction: Secondary | ICD-10-CM | POA: Diagnosis present

## 2016-04-12 DIAGNOSIS — I482 Chronic atrial fibrillation: Secondary | ICD-10-CM | POA: Diagnosis present

## 2016-04-12 DIAGNOSIS — I110 Hypertensive heart disease with heart failure: Secondary | ICD-10-CM | POA: Diagnosis present

## 2016-04-12 DIAGNOSIS — C182 Malignant neoplasm of ascending colon: Secondary | ICD-10-CM | POA: Diagnosis present

## 2016-04-12 DIAGNOSIS — R000 Tachycardia, unspecified: Secondary | ICD-10-CM | POA: Diagnosis present

## 2016-04-12 HISTORY — PX: LAPAROSCOPIC PARTIAL COLECTOMY: SHX5907

## 2016-04-12 LAB — CBC
HEMATOCRIT: 39 % (ref 39.0–52.0)
Hemoglobin: 12.1 g/dL — ABNORMAL LOW (ref 13.0–17.0)
MCH: 24.1 pg — ABNORMAL LOW (ref 26.0–34.0)
MCHC: 31 g/dL (ref 30.0–36.0)
MCV: 77.5 fL — AB (ref 78.0–100.0)
Platelets: 189 10*3/uL (ref 150–400)
RBC: 5.03 MIL/uL (ref 4.22–5.81)
RDW: 21.4 % — AB (ref 11.5–15.5)
WBC: 8.8 10*3/uL (ref 4.0–10.5)

## 2016-04-12 LAB — PROTIME-INR
INR: 1.13
PROTHROMBIN TIME: 14.6 s (ref 11.4–15.2)

## 2016-04-12 LAB — CREATININE, SERUM
Creatinine, Ser: 0.94 mg/dL (ref 0.61–1.24)
GFR calc Af Amer: >60 mL/min (ref >60)
GFR calc non Af Amer: >60 mL/min (ref >60)

## 2016-04-12 SURGERY — LAPAROSCOPIC PARTIAL COLECTOMY
Anesthesia: General | Site: Abdomen

## 2016-04-12 MED ORDER — ONDANSETRON HCL 4 MG/2ML IJ SOLN: 4.0000 mg | Freq: Four times a day (QID) | INTRAMUSCULAR | Status: DC | PRN

## 2016-04-12 MED ORDER — BUPIVACAINE-EPINEPHRINE (PF) 0.25% -1:200000 IJ SOLN
INTRAMUSCULAR | Status: AC
  Filled 2016-04-12: qty 30

## 2016-04-12 MED ORDER — LISINOPRIL 10 MG PO TABS
20.0000 mg | ORAL_TABLET | Freq: Every day | ORAL | Status: DC
  Administered 2016-04-13 – 2016-04-16 (×4): 20 mg via ORAL
  Filled 2016-04-12 (×4): qty 2

## 2016-04-12 MED ORDER — HYDROMORPHONE HCL 1 MG/ML IJ SOLN
0.2500 mg | INTRAMUSCULAR | Status: DC | PRN
  Administered 2016-04-12 (×2): 0.5 mg via INTRAVENOUS

## 2016-04-12 MED ORDER — PROPOFOL 10 MG/ML IV BOLUS
INTRAVENOUS | Status: DC | PRN
  Administered 2016-04-12: 120 mg via INTRAVENOUS

## 2016-04-12 MED ORDER — LIDOCAINE HCL 1 % IJ SOLN
INTRAMUSCULAR | Status: DC | PRN
  Administered 2016-04-12: 5 mL

## 2016-04-12 MED ORDER — ACETAMINOPHEN 500 MG PO TABS
1000.0000 mg | ORAL_TABLET | ORAL | Status: DC
  Filled 2016-04-12: qty 2

## 2016-04-12 MED ORDER — METOPROLOL TARTRATE 25 MG PO TABS
25.0000 mg | ORAL_TABLET | Freq: Two times a day (BID) | ORAL | Status: DC
  Administered 2016-04-12 – 2016-04-14 (×4): 25 mg via ORAL
  Filled 2016-04-12 (×4): qty 1

## 2016-04-12 MED ORDER — ALBUTEROL SULFATE (2.5 MG/3ML) 0.083% IN NEBU
3.0000 mL | INHALATION_SOLUTION | Freq: Four times a day (QID) | RESPIRATORY_TRACT | Status: DC | PRN
  Administered 2016-04-15 (×2): 3 mL via RESPIRATORY_TRACT
  Filled 2016-04-12 (×2): qty 3

## 2016-04-12 MED ORDER — DEXTROSE 5 % IV SOLN
2.0000 g | INTRAVENOUS | Status: AC
  Administered 2016-04-12: 2 g via INTRAVENOUS
  Filled 2016-04-12: qty 2

## 2016-04-12 MED ORDER — ONDANSETRON HCL 4 MG/2ML IJ SOLN
4.0000 mg | Freq: Four times a day (QID) | INTRAMUSCULAR | Status: DC | PRN
  Administered 2016-04-15: 4 mg via INTRAVENOUS
  Filled 2016-04-12: qty 2

## 2016-04-12 MED ORDER — DILTIAZEM HCL 60 MG PO TABS
120.0000 mg | ORAL_TABLET | Freq: Every day | ORAL | Status: DC
  Administered 2016-04-13 – 2016-04-16 (×4): 120 mg via ORAL
  Filled 2016-04-12 (×4): qty 2

## 2016-04-12 MED ORDER — HYDROMORPHONE HCL 1 MG/ML IJ SOLN
INTRAMUSCULAR | Status: AC
  Filled 2016-04-12: qty 1

## 2016-04-12 MED ORDER — EPHEDRINE SULFATE 50 MG/ML IJ SOLN
INTRAMUSCULAR | Status: DC | PRN
  Administered 2016-04-12: 10 mg via INTRAVENOUS

## 2016-04-12 MED ORDER — PHENYLEPHRINE HCL 10 MG/ML IJ SOLN
INTRAVENOUS | Status: DC | PRN
  Administered 2016-04-12: 20 ug/min via INTRAVENOUS

## 2016-04-12 MED ORDER — NALOXONE HCL 0.4 MG/ML IJ SOLN: 0.4000 mg | INTRAMUSCULAR | Status: DC | PRN

## 2016-04-12 MED ORDER — PROMETHAZINE HCL 25 MG/ML IJ SOLN: 6.2500 mg | INTRAMUSCULAR | Status: DC | PRN

## 2016-04-12 MED ORDER — SODIUM CHLORIDE 0.9 % IR SOLN
Status: DC | PRN
  Administered 2016-04-12: 1000 mL

## 2016-04-12 MED ORDER — LIDOCAINE HCL (PF) 1 % IJ SOLN
INTRAMUSCULAR | Status: AC
  Filled 2016-04-12: qty 30

## 2016-04-12 MED ORDER — DIPHENHYDRAMINE HCL 12.5 MG/5ML PO ELIX: 12.5000 mg | ORAL_SOLUTION | Freq: Four times a day (QID) | ORAL | Status: DC | PRN

## 2016-04-12 MED ORDER — GUAIFENESIN ER 600 MG PO TB12
1200.0000 mg | ORAL_TABLET | Freq: Two times a day (BID) | ORAL | Status: DC
  Administered 2016-04-12 – 2016-04-16 (×8): 1200 mg via ORAL
  Filled 2016-04-12 (×8): qty 2

## 2016-04-12 MED ORDER — MORPHINE SULFATE 2 MG/ML IV SOLN
INTRAVENOUS | Status: DC
  Administered 2016-04-12: 15 mg via INTRAVENOUS
  Administered 2016-04-12: 15:00:00 via INTRAVENOUS
  Administered 2016-04-13: 4 mg via INTRAVENOUS
  Administered 2016-04-13: 3 mg via INTRAVENOUS

## 2016-04-12 MED ORDER — IPRATROPIUM BROMIDE 0.02 % IN SOLN: 0.5000 mg | RESPIRATORY_TRACT | Status: DC | PRN

## 2016-04-12 MED ORDER — ALVIMOPAN 12 MG PO CAPS
12.0000 mg | ORAL_CAPSULE | Freq: Once | ORAL | Status: AC
  Administered 2016-04-12: 12 mg via ORAL
  Filled 2016-04-12: qty 1

## 2016-04-12 MED ORDER — FUROSEMIDE 80 MG PO TABS
80.0000 mg | ORAL_TABLET | Freq: Two times a day (BID) | ORAL | Status: DC
  Administered 2016-04-12 – 2016-04-16 (×7): 80 mg via ORAL
  Filled 2016-04-12 (×7): qty 1

## 2016-04-12 MED ORDER — LACTATED RINGERS IV SOLN
INTRAVENOUS | Status: DC
  Administered 2016-04-12: 11:00:00 via INTRAVENOUS

## 2016-04-12 MED ORDER — LIDOCAINE HCL (CARDIAC) 20 MG/ML IV SOLN
INTRAVENOUS | Status: DC | PRN
  Administered 2016-04-12: 80 mg via INTRAVENOUS

## 2016-04-12 MED ORDER — ALVIMOPAN 12 MG PO CAPS
12.0000 mg | ORAL_CAPSULE | Freq: Two times a day (BID) | ORAL | Status: DC
  Administered 2016-04-13 – 2016-04-14 (×4): 12 mg via ORAL
  Filled 2016-04-12 (×5): qty 1

## 2016-04-12 MED ORDER — DIPHENHYDRAMINE HCL 50 MG/ML IJ SOLN: 12.5000 mg | Freq: Four times a day (QID) | INTRAMUSCULAR | Status: DC | PRN

## 2016-04-12 MED ORDER — GABAPENTIN 300 MG PO CAPS
300.0000 mg | ORAL_CAPSULE | ORAL | Status: AC
  Administered 2016-04-12: 300 mg via ORAL
  Filled 2016-04-12: qty 1

## 2016-04-12 MED ORDER — CEFOTETAN DISODIUM-DEXTROSE 2-2.08 GM-% IV SOLR
INTRAVENOUS | Status: AC
  Filled 2016-04-12: qty 50

## 2016-04-12 MED ORDER — IPRATROPIUM BROMIDE HFA 17 MCG/ACT IN AERS: 2.0000 | INHALATION_SPRAY | RESPIRATORY_TRACT | Status: DC | PRN

## 2016-04-12 MED ORDER — FENTANYL CITRATE (PF) 100 MCG/2ML IJ SOLN
INTRAMUSCULAR | Status: DC | PRN
  Administered 2016-04-12 (×2): 50 ug via INTRAVENOUS

## 2016-04-12 MED ORDER — MORPHINE SULFATE (PF) 2 MG/ML IV SOLN: 1.0000 mg | INTRAVENOUS | Status: DC | PRN

## 2016-04-12 MED ORDER — SODIUM CHLORIDE 0.9% FLUSH: 9.0000 mL | INTRAVENOUS | Status: DC | PRN

## 2016-04-12 MED ORDER — DEXTROSE 5 % IV SOLN: 2.0000 g | INTRAVENOUS | Status: DC

## 2016-04-12 MED ORDER — LACTATED RINGERS IV SOLN
INTRAVENOUS | Status: DC | PRN
  Administered 2016-04-12 (×2): via INTRAVENOUS

## 2016-04-12 MED ORDER — FENTANYL CITRATE (PF) 100 MCG/2ML IJ SOLN
INTRAMUSCULAR | Status: AC
  Filled 2016-04-12: qty 4

## 2016-04-12 MED ORDER — KCL IN DEXTROSE-NACL 20-5-0.45 MEQ/L-%-% IV SOLN
INTRAVENOUS | Status: DC
  Administered 2016-04-12 – 2016-04-14 (×2): via INTRAVENOUS
  Filled 2016-04-12 (×2): qty 1000

## 2016-04-12 MED ORDER — DEXTROSE 5 % IV SOLN
2.0000 g | Freq: Two times a day (BID) | INTRAVENOUS | Status: AC
  Administered 2016-04-12: 2 g via INTRAVENOUS
  Filled 2016-04-12: qty 2

## 2016-04-12 MED ORDER — KCL IN DEXTROSE-NACL 20-5-0.45 MEQ/L-%-% IV SOLN
INTRAVENOUS | Status: AC
  Filled 2016-04-12: qty 1000

## 2016-04-12 MED ORDER — ENOXAPARIN SODIUM 40 MG/0.4ML ~~LOC~~ SOLN
40.0000 mg | SUBCUTANEOUS | Status: DC
  Administered 2016-04-13 – 2016-04-16 (×4): 40 mg via SUBCUTANEOUS
  Filled 2016-04-12 (×4): qty 0.4

## 2016-04-12 MED ORDER — MORPHINE SULFATE 2 MG/ML IV SOLN
INTRAVENOUS | Status: AC
  Filled 2016-04-12: qty 25

## 2016-04-12 MED ORDER — SUGAMMADEX SODIUM 200 MG/2ML IV SOLN
INTRAVENOUS | Status: DC | PRN
  Administered 2016-04-12: 227.8 mg via INTRAVENOUS

## 2016-04-12 MED ORDER — POTASSIUM CHLORIDE CRYS ER 20 MEQ PO TBCR
40.0000 meq | EXTENDED_RELEASE_TABLET | Freq: Every day | ORAL | Status: DC
  Administered 2016-04-12 – 2016-04-16 (×4): 40 meq via ORAL
  Filled 2016-04-12 (×4): qty 2

## 2016-04-12 MED ORDER — PROCHLORPERAZINE EDISYLATE 5 MG/ML IJ SOLN
10.0000 mg | Freq: Four times a day (QID) | INTRAMUSCULAR | Status: DC | PRN
  Filled 2016-04-12: qty 2

## 2016-04-12 MED ORDER — CELECOXIB 200 MG PO CAPS
400.0000 mg | ORAL_CAPSULE | ORAL | Status: AC
  Administered 2016-04-12: 400 mg via ORAL
  Filled 2016-04-12: qty 2

## 2016-04-12 MED ORDER — ONDANSETRON HCL 4 MG/2ML IJ SOLN
INTRAMUSCULAR | Status: DC | PRN
  Administered 2016-04-12: 4 mg via INTRAVENOUS

## 2016-04-12 MED ORDER — PROPOFOL 10 MG/ML IV BOLUS
INTRAVENOUS | Status: AC
  Filled 2016-04-12: qty 20

## 2016-04-12 MED ORDER — ACETAMINOPHEN 500 MG PO TABS: 1000.0000 mg | ORAL_TABLET | Freq: Four times a day (QID) | ORAL | Status: DC | PRN

## 2016-04-12 MED ORDER — ROCURONIUM BROMIDE 100 MG/10ML IV SOLN
INTRAVENOUS | Status: DC | PRN
  Administered 2016-04-12: 10 mg via INTRAVENOUS
  Administered 2016-04-12: 60 mg via INTRAVENOUS

## 2016-04-12 MED ORDER — CEFOTETAN DISODIUM 2 G IJ SOLR: 2.0000 g | INTRAMUSCULAR | Status: DC

## 2016-04-12 MED ORDER — HYDRALAZINE HCL 50 MG PO TABS
50.0000 mg | ORAL_TABLET | Freq: Two times a day (BID) | ORAL | Status: DC
  Administered 2016-04-12 – 2016-04-16 (×8): 50 mg via ORAL
  Filled 2016-04-12 (×8): qty 1

## 2016-04-12 SURGICAL SUPPLY — 77 items
APPLIER CLIP 5 13 M/L LIGAMAX5 (MISCELLANEOUS)
APPLIER CLIP ROT 10 11.4 M/L (STAPLE)
BLADE SURG ROTATE 9660 (MISCELLANEOUS) IMPLANT
CANISTER SUCTION 2500CC (MISCELLANEOUS) ×3 IMPLANT
CELLS DAT CNTRL 66122 CELL SVR (MISCELLANEOUS) ×1 IMPLANT
CHLORAPREP W/TINT 26ML (MISCELLANEOUS) ×3 IMPLANT
CLIP APPLIE 5 13 M/L LIGAMAX5 (MISCELLANEOUS) IMPLANT
CLIP APPLIE ROT 10 11.4 M/L (STAPLE) IMPLANT
COVER MAYO STAND STRL (DRAPES) ×3 IMPLANT
COVER SURGICAL LIGHT HANDLE (MISCELLANEOUS) ×6 IMPLANT
DRAPE PROXIMA HALF (DRAPES) ×3 IMPLANT
DRAPE UTILITY XL STRL (DRAPES) ×15 IMPLANT
DRAPE WARM FLUID 44X44 (DRAPE) ×3 IMPLANT
DRSG OPSITE POSTOP 4X10 (GAUZE/BANDAGES/DRESSINGS) IMPLANT
DRSG OPSITE POSTOP 4X8 (GAUZE/BANDAGES/DRESSINGS) IMPLANT
DRSG TEGADERM 2-3/8X2-3/4 SM (GAUZE/BANDAGES/DRESSINGS) ×9 IMPLANT
ELECT BLADE 6.5 EXT (BLADE) ×3 IMPLANT
ELECT CAUTERY BLADE 6.4 (BLADE) ×6 IMPLANT
ELECT REM PT RETURN 9FT ADLT (ELECTROSURGICAL) ×3
ELECTRODE REM PT RTRN 9FT ADLT (ELECTROSURGICAL) ×1 IMPLANT
GAUZE SPONGE 2X2 8PLY STRL LF (GAUZE/BANDAGES/DRESSINGS) ×1 IMPLANT
GEL ULTRASOUND 20GR AQUASONIC (MISCELLANEOUS) IMPLANT
GLOVE BIO SURGEON STRL SZ 6 (GLOVE) ×6 IMPLANT
GLOVE BIOGEL PI IND STRL 6.5 (GLOVE) ×3 IMPLANT
GLOVE BIOGEL PI INDICATOR 6.5 (GLOVE) ×6
GLOVE SURG SS PI 6.5 STRL IVOR (GLOVE) ×3 IMPLANT
GOWN STRL REUS W/ TWL LRG LVL3 (GOWN DISPOSABLE) ×6 IMPLANT
GOWN STRL REUS W/TWL 2XL LVL3 (GOWN DISPOSABLE) ×6 IMPLANT
GOWN STRL REUS W/TWL LRG LVL3 (GOWN DISPOSABLE) ×12
KIT BASIN OR (CUSTOM PROCEDURE TRAY) ×3 IMPLANT
KIT ROOM TURNOVER OR (KITS) ×3 IMPLANT
L-HOOK LAP DISP 36CM (ELECTROSURGICAL) ×3
LEGGING LITHOTOMY PAIR STRL (DRAPES) IMPLANT
LHOOK LAP DISP 36CM (ELECTROSURGICAL) ×1 IMPLANT
LIGASURE IMPACT 36 18CM CVD LR (INSTRUMENTS) IMPLANT
NS IRRIG 1000ML POUR BTL (IV SOLUTION) ×6 IMPLANT
PAD ARMBOARD 7.5X6 YLW CONV (MISCELLANEOUS) ×6 IMPLANT
PENCIL BUTTON HOLSTER BLD 10FT (ELECTRODE) ×9 IMPLANT
RELOAD PROXIMATE 75MM BLUE (ENDOMECHANICALS) ×6 IMPLANT
RTRCTR WOUND ALEXIS 18CM MED (MISCELLANEOUS) ×3
SCISSORS LAP 5X35 DISP (ENDOMECHANICALS) ×3 IMPLANT
SEALER TISSUE G2 STRG ARTC 35C (ENDOMECHANICALS) IMPLANT
SET IRRIG TUBING LAPAROSCOPIC (IRRIGATION / IRRIGATOR) IMPLANT
SHEARS HARMONIC ACE PLUS 36CM (ENDOMECHANICALS) ×3 IMPLANT
SLEEVE ENDOPATH XCEL 5M (ENDOMECHANICALS) ×6 IMPLANT
SPECIMEN JAR LARGE (MISCELLANEOUS) ×3 IMPLANT
SPONGE GAUZE 2X2 STER 10/PKG (GAUZE/BANDAGES/DRESSINGS) ×2
SPONGE LAP 18X18 X RAY DECT (DISPOSABLE) IMPLANT
STAPLER GUN LINEAR PROX 60 (STAPLE) ×3 IMPLANT
STAPLER PROXIMATE 75MM BLUE (STAPLE) ×3 IMPLANT
STAPLER VISISTAT 35W (STAPLE) ×3 IMPLANT
SUCTION POOLE TIP (SUCTIONS) ×3 IMPLANT
SURGILUBE 2OZ TUBE FLIPTOP (MISCELLANEOUS) IMPLANT
SUT PDS AB 1 CT  36 (SUTURE) ×4
SUT PDS AB 1 CT 36 (SUTURE) ×2 IMPLANT
SUT PROLENE 2 0 CT2 30 (SUTURE) IMPLANT
SUT PROLENE 2 0 KS (SUTURE) IMPLANT
SUT VIC AB 2-0 SH 18 (SUTURE) ×3 IMPLANT
SUT VIC AB 3-0 SH 18 (SUTURE) ×3 IMPLANT
SUT VICRYL AB 2 0 TIES (SUTURE) ×3 IMPLANT
SUT VICRYL AB 3 0 TIES (SUTURE) ×3 IMPLANT
SYR BULB IRRIGATION 50ML (SYRINGE) ×3 IMPLANT
SYS LAPSCP GELPORT 120MM (MISCELLANEOUS)
SYSTEM LAPSCP GELPORT 120MM (MISCELLANEOUS) IMPLANT
TOWEL OR 17X26 10 PK STRL BLUE (TOWEL DISPOSABLE) ×6 IMPLANT
TRAY FOLEY CATH 16FRSI W/METER (SET/KITS/TRAYS/PACK) IMPLANT
TRAY LAPAROSCOPIC MC (CUSTOM PROCEDURE TRAY) ×3 IMPLANT
TRAY PROCTOSCOPIC FIBER OPTIC (SET/KITS/TRAYS/PACK) IMPLANT
TROCAR XCEL 12X100 BLDLESS (ENDOMECHANICALS) IMPLANT
TROCAR XCEL BLUNT TIP 100MML (ENDOMECHANICALS) IMPLANT
TROCAR XCEL NON-BLD 11X100MML (ENDOMECHANICALS) IMPLANT
TROCAR XCEL NON-BLD 5MMX100MML (ENDOMECHANICALS) ×3 IMPLANT
TUBE CONNECTING 12'X1/4 (SUCTIONS) ×2
TUBE CONNECTING 12X1/4 (SUCTIONS) ×4 IMPLANT
TUBING FILTER THERMOFLATOR (ELECTROSURGICAL) ×3 IMPLANT
TUBING INSUFFLATION (TUBING) ×3 IMPLANT
YANKAUER SUCT BULB TIP NO VENT (SUCTIONS) ×6 IMPLANT

## 2016-04-12 NOTE — Op Note (Signed)
    Laparoscopic Partial Colectomy (R hemicolectomy) Procedure Note   Indications: This patient presents for a laparoscopic right colectomy for colon cancer  Pre-operative Diagnosis: cecal cancer   Post-operative Diagnosis: Same  Surgeon: [name]   Assistants: Judyann Munson, RNFA  Anesthesia: General endotracheal anesthesia   ASA Class: 3   Procedure Details  The patient was seen in the Holding Room. The risks, benefits, complications, treatment options, and expected outcomes were discussed with the patient. The possibilities of reaction to medication, perforation of viscus, bleeding, recurrent infection, finding a normal colon, the need for additional procedures, failure to diagnose a condition, and creating a complication requiring transfusion or operation were discussed with the patient. The patient was advised of the risk of ostomy. The patient concurred with the proposed plan, giving informed consent. The patient was taken to the operating room, identified, and the procedure verified as partial colectomy. A Time Out was held and the above information confirmed.   The patient was brought to the operating room and placed supine. After induction of a general anesthetic, a Foley catheter was inserted and the abdomen was prepped and draped in standard fashion. Local anesthetic was infiltrated at the left costal margin. A 5 mm Optiview trocar was placed into the abdomen under direct visualization. Pneumoperitoneum was insufflated to a pressure of 15 mm Hg. The laparoscope was introduced.   Exploration revealed a normal omentum, colon, small bowel, peritoneum, liver, and stomach. Two additional left lateral 5-mm trocars were then placed after anesthetizing the skin and peritoneum with Marcaine. The ascending colon and hepatic flexure were then mobilized with gentle retraction of the colon in a medial direction with mobilization of the peritoneal reflection with cautery and the Enseal.  Mobilization of this area was complete to expose the retroperitoneum. There was no blood loss during this portion of the procedure. The duodenum was avoided. The omentum was taken off the middle and proximal transverse colon with the Harmonic scalpel   After completing mobilization, a 6 cm incision was made in the midline just above the umbilicus.  An Alexis wound protector was placed to protect the skin, and the colon was delivered through the incision. The terminal ileum and colon was resected with a linear stapling device proximal and distal to the area in question in regard to the specimen. The mesenteric vessels were clamped, cut, and tied with 2-0 vicryl ties in addition to the harmonic scalpel. The specimen was submitted to pathology.   An end-to-end anastomosis was performed through the small anterior incision with the linear stapling device. The mucosa was inspected and found to be hemostatic. Closure was achieved with the transverse stapling device. A 3-0 Vicryl suture was used to reapproximate the angle of the anastomosis. Hemostasis was confirmed. The mesenteric defect was closed with 2-0 running Vicryl suture. The bowel anastomosis was returned.    The fascial incision was then closed with a running #1-PDS suture. The soft tissue was irrigated and the incisions were closed with 4-0 Vicryl subcuticular closure. Dermabond was applied.   Instrument, sponge, and needle counts were correct prior to abdominal closure and at the conclusion of the case.   Findings: mass in cecum  Estimated Blood Loss: Minimal   Specimens: R colon   Complications: None; patient tolerated the procedure well.   Disposition: PACU - hemodynamically stable.   Condition: stable

## 2016-04-12 NOTE — Progress Notes (Signed)
Pt arrived to 2w29. Tele box on, CCMD verified x2. VSS. Pt currently resting, c/o soreness at abdominal incision sites. Morphine PCA pump at bedside. Will continue to monitor.   Jaymes Graff, RN

## 2016-04-12 NOTE — Addendum Note (Signed)
Addendum  created 04/12/16 1548 by Eligha Bridegroom, CRNA   Anesthesia Event edited

## 2016-04-12 NOTE — Telephone Encounter (Signed)
Oncology Nurse Navigator Documentation  Oncology Nurse Navigator Flowsheets 04/12/2016  Navigator Location [facility]  Navigator Encounter Type Telephone  Telephone Incoming Call  Abnormal Finding Date -  Confirmed Diagnosis Date -  Surgery Date 04/12/2016  Patient Visit Type -  Treatment Phase -  Barriers/Navigation Needs Coordination of Care--preop medications  Education -  Interventions Coordination of Care--called [facility] surgical short stay and reported patient is calling asking what meds he can take this morning. Nurse agrees to call him at home now. Made patient aware to expect a call from preop nurse.  Education Method -  Support Groups/Services -  Acuity -  Time Spent with Patient 15

## 2016-04-12 NOTE — Anesthesia Postprocedure Evaluation (Signed)
Anesthesia Post Note  Patient: Steven Barnett  Procedure(s) Performed: Procedure(s) (LRB): LAPAROSCOPIC ILEOCOLECTOMY (N/A)  Patient location during evaluation: PACU Anesthesia Type: General Level of consciousness: awake and alert Pain management: pain level controlled Vital Signs Assessment: post-procedure vital signs reviewed and stable Respiratory status: spontaneous breathing, nonlabored ventilation, respiratory function stable and patient connected to nasal cannula oxygen Cardiovascular status: blood pressure returned to baseline and stable Postop Assessment: no signs of nausea or vomiting Anesthetic complications: no    Last Vitals:  Vitals:   04/12/16 1445 04/12/16 1453  BP:  119/85  Pulse:  (!) 48  Resp:  15  Temp: 36.5 C     Last Pain:  Vitals:   04/12/16 1033  TempSrc: Oral                 Zenaida Deed

## 2016-04-12 NOTE — Transfer of Care (Signed)
Immediate Anesthesia Transfer of Care Note  Patient: Steven Barnett  Procedure(s) Performed: Procedure(s): LAPAROSCOPIC ILEOCOLECTOMY (N/A)  Patient Location: PACU  Anesthesia Type:General  Level of Consciousness: awake, alert  and oriented  Airway & Oxygen Therapy: Patient Spontanous Breathing and Patient connected to nasal cannula oxygen  Post-op Assessment: Report given to RN and Post -op Vital signs reviewed and stable  Post vital signs: Reviewed and stable  Last Vitals:  Vitals:   04/12/16 1445 04/12/16 1453  BP:  119/85  Pulse:  (!) 48  Resp:  15  Temp: 36.5 C     Last Pain:  Vitals:   04/12/16 1033  TempSrc: Oral         Complications: No apparent anesthesia complications

## 2016-04-12 NOTE — Anesthesia Preprocedure Evaluation (Signed)
Anesthesia Evaluation  Patient identified by MRN, date of birth, ID band Patient awake    Reviewed: Allergy & Precautions, H&P , Patient's Chart, lab work & pertinent test results, reviewed documented beta blocker date and time   Airway Mallampati: II  TM Distance: >3 FB Neck ROM: full    Dental no notable dental hx.    Pulmonary sleep apnea and Continuous Positive Airway Pressure Ventilation , former smoker,    Pulmonary exam normal breath sounds clear to auscultation       Cardiovascular hypertension, +CHF  + dysrhythmias Atrial Fibrillation  Rhythm:regular Rate:Normal     Neuro/Psych    GI/Hepatic   Endo/Other    Renal/GU      Musculoskeletal  (+) Arthritis ,   Abdominal   Peds  Hematology   Anesthesia Other Findings     Reproductive/Obstetrics                             Anesthesia Physical  Anesthesia Plan  ASA: III  Anesthesia Plan: General   Post-op Pain Management:    Induction: Intravenous  Airway Management Planned: Oral ETT  Additional Equipment:   Intra-op Plan:   Post-operative Plan: Extubation in OR  Informed Consent: I have reviewed the patients History and Physical, chart, labs and discussed the procedure including the risks, benefits and alternatives for the proposed anesthesia with the patient or authorized representative who has indicated his/her understanding and acceptance.   Dental Advisory Given  Plan Discussed with: CRNA and Surgeon  Anesthesia Plan Comments:         Anesthesia Quick Evaluation

## 2016-04-12 NOTE — Interval H&P Note (Signed)
History and Physical Interval Note:  04/12/2016 12:13 PM  Steven Barnett  has presented today for surgery, with the diagnosis of colon cancer  The various methods of treatment have been discussed with the patient and family. After consideration of risks, benefits and other options for treatment, the patient has consented to  Procedure(s): LAPAROSCOPIC ILEOCOLECTOMY (N/A) as a surgical intervention .  The patient's history has been reviewed, patient examined, no change in status, stable for surgery.  I have reviewed the patient's chart and labs.  Questions were answered to the patient's satisfaction.     Jeri Rawlins

## 2016-04-12 NOTE — Anesthesia Procedure Notes (Signed)
Procedure Name: Intubation Date/Time: 04/12/2016 12:42 PM Performed by: Eligha Bridegroom Pre-anesthesia Checklist: Patient identified, Emergency Drugs available, Suction available and Patient being monitored Patient Re-evaluated:Patient Re-evaluated prior to inductionOxygen Delivery Method: Circle system utilized Preoxygenation: Pre-oxygenation with 100% oxygen Intubation Type: IV induction Ventilation: Mask ventilation without difficulty and Oral airway inserted - appropriate to patient size Laryngoscope Size: Mac and 4 Grade View: Grade II Tube type: Oral Tube size: 8.0 mm Number of attempts: 1 Airway Equipment and Method: Stylet and LTA kit utilized Placement Confirmation: ETT inserted through vocal cords under direct vision,  positive ETCO2 and breath sounds checked- equal and bilateral Secured at: 22 cm Tube secured with: Tape Dental Injury: Teeth and Oropharynx as per pre-operative assessment

## 2016-04-12 NOTE — Progress Notes (Signed)
Ileene Rubens RN pulled Morphine for pt pca for me ,but when scanned Fentanyl found (not Morphine), Traci K,CPHT made aware and Fentanyl wasted with her and Microbiologist

## 2016-04-12 NOTE — Progress Notes (Signed)
Nurse paged Dr. [name] and page was returned. Nurse informed her that patient did bowel prep yesterday, however he consumed spaghetti last night at 2200.

## 2016-04-12 NOTE — Progress Notes (Signed)
Briefly seen post-op. Mostly incisional pain. Rate-controlled a-fib and no chest pain. Will follow-up tomorrow. Call with questions.  Pixie Casino, MD, [facility] Attending Cardiologist [place]

## 2016-04-13 ENCOUNTER — Encounter (HOSPITAL_COMMUNITY): Payer: Self-pay | Admitting: General Surgery

## 2016-04-13 DIAGNOSIS — Z7901 Long term (current) use of anticoagulants: Secondary | ICD-10-CM

## 2016-04-13 DIAGNOSIS — C180 Malignant neoplasm of cecum: Principal | ICD-10-CM

## 2016-04-13 DIAGNOSIS — I482 Chronic atrial fibrillation: Secondary | ICD-10-CM

## 2016-04-13 LAB — BASIC METABOLIC PANEL
ANION GAP: 5 (ref 5–15)
BUN: 6 mg/dL (ref 6–20)
CALCIUM: 8.6 mg/dL — AB (ref 8.9–10.3)
CO2: 31 mmol/L (ref 22–32)
Chloride: 101 mmol/L (ref 101–111)
Creatinine, Ser: 0.92 mg/dL (ref 0.61–1.24)
GLUCOSE: 95 mg/dL (ref 65–99)
POTASSIUM: 4.8 mmol/L (ref 3.5–5.1)
Sodium: 137 mmol/L (ref 135–145)

## 2016-04-13 LAB — CBC
HEMATOCRIT: 39.2 % (ref 39.0–52.0)
Hemoglobin: 11.6 g/dL — ABNORMAL LOW (ref 13.0–17.0)
MCH: 23.7 pg — ABNORMAL LOW (ref 26.0–34.0)
MCHC: 29.6 g/dL — ABNORMAL LOW (ref 30.0–36.0)
MCV: 80.2 fL (ref 78.0–100.0)
Platelets: 194 10*3/uL (ref 150–400)
RBC: 4.89 MIL/uL (ref 4.22–5.81)
RDW: 21.2 % — AB (ref 11.5–15.5)
WBC: 9.9 10*3/uL (ref 4.0–10.5)

## 2016-04-13 MED ORDER — MORPHINE SULFATE (PF) 2 MG/ML IV SOLN
2.0000 mg | INTRAVENOUS | Status: DC | PRN
  Administered 2016-04-13 – 2016-04-15 (×4): 2 mg via INTRAVENOUS
  Filled 2016-04-13 (×4): qty 1

## 2016-04-13 MED ORDER — SODIUM CHLORIDE 0.9 % IV BOLUS (SEPSIS)
500.0000 mL | Freq: Once | INTRAVENOUS | Status: AC
  Administered 2016-04-13: 500 mL via INTRAVENOUS

## 2016-04-13 NOTE — Progress Notes (Signed)
Paged Byerly at 1600, no return call back at this time. Pt. Was c/o pain in blad der. Spoke with another surgery who placed orders.

## 2016-04-13 NOTE — Progress Notes (Signed)
Patient Name: Steven Barnett Date of Encounter: 04/13/2016  Hospital Problem List     Active Problems:   Cecal cancer ([place])    Subjective   C/o abd pain post surgery. On PCA pump  Inpatient Medications    . alvimopan  12 mg Oral BID  . diltiazem  120 mg Oral Daily  . enoxaparin (LOVENOX) injection  40 mg Subcutaneous Q24H  . furosemide  80 mg Oral BID  . guaiFENesin  1,200 mg Oral BID  . hydrALAZINE  50 mg Oral BID  . lisinopril  20 mg Oral Daily  . metoprolol tartrate  25 mg Oral BID  . morphine   Intravenous Q4H  . potassium chloride SA  40 mEq Oral Daily    Vital Signs    Vitals:   04/13/16 0408 04/13/16 0501 04/13/16 0800 04/13/16 1009  BP:  (!) 149/85  124/75  Pulse:  (!) 108  96  Resp: 17 20 18    Temp:  97.9 F (36.6 C)    TempSrc:  Oral    SpO2: 99% 98% 96%   Weight:        Intake/Output Summary (Last 24 hours) at 04/13/16 1027 Last data filed at 04/13/16 0800  Gross per 24 hour  Intake          3166.25 ml  Output              230 ml  Net          2936.25 ml   Filed Weights   04/12/16 1033  Weight: 251 lb (113.9 kg)    Physical Exam    General: Pleasant older AA male, NAD. Neuro: Alert and oriented X 3. Moves all extremities spontaneously. Psych: Normal affect. HEENT:  Normal  Neck: Supple without bruits or JVD. Lungs:  Resp regular and unlabored, CTA. Heart: irregularly irregular no s3, s4, or murmurs. Abdomen: Distended, dry surgical dressings in place.  Extremities: No clubbing, cyanosis or edema. DP/PT/Radials 2+ and equal bilaterally.  Labs    CBC  Recent Labs  04/12/16 1806 04/13/16 0332  WBC 8.8 9.9  HGB 12.1* 11.6*  HCT 39.0 39.2  MCV 77.5* 80.2  PLT 189 709   Basic Metabolic Panel  Recent Labs  04/12/16 1806 04/13/16 0332  NA  --  137  K  --  4.8  CL  --  101  CO2  --  31  GLUCOSE  --  95  BUN  --  6  CREATININE 0.94 0.92  CALCIUM  --  8.6*   Liver Function Tests No results for input(s): AST, ALT,  ALKPHOS, BILITOT, PROT, ALBUMIN in the last 72 hours. No results for input(s): LIPASE, AMYLASE in the last 72 hours. Cardiac Enzymes No results for input(s): CKTOTAL, CKMB, CKMBINDEX, TROPONINI in the last 72 hours. BNP Invalid input(s): POCBNP D-Dimer No results for input(s): DDIMER in the last 72 hours. Hemoglobin A1C No results for input(s): HGBA1C in the last 72 hours. Fasting Lipid Panel No results for input(s): CHOL, HDL, LDLCALC, TRIG, CHOLHDL, LDLDIRECT in the last 72 hours. Thyroid Function Tests No results for input(s): TSH, T4TOTAL, T3FREE, THYROIDAB in the last 72 hours.  Invalid input(s): FREET3  Telemetry    Atrial Fib, tachy this morning.   ECG    N/A  Radiology      Assessment & Plan    65 yo male with PMH of chronic atrial fib, HTN, diastolic CHF, colon CA who underwent lap ileocolectomy with Dr. [name]  on 10/10.  1. Chronic atrial fib: Rate generally controlled, some tachycardia this morning but expected given recent GI surgery and post op pain. Denies any chest pain or dyspnea. Home medications include dilt, metoprolol and coumadin.  -- Suspect his rate will improve as he progresses post operatively. Would continue with home medications.  -- Hgb stable post surgery, will need to resume anticoagulation when cleared from surgical standpoint.  -- This patients CHA2DS2-VASc Score and unadjusted Ischemic Stroke Rate (% per year) is equal to 2.2 % stroke rate/year from a score of 2 Above score calculated as 1 point each if present [CHF, HTN, DM, Vascular=MI/PAD/Aortic Plaque, Age if 65-74, or Male] Above score calculated as 2 points each if present [Age > 75, or Stroke/TIA/TE]  2. Colon Ca s/p lap ileocolectomy: surgery following.   3. Diastolic CHF: volume status stable. Continue with current lasix dosage and monitor for signs of overload.     Signed, Reino Bellis NP-C Pager [phone]

## 2016-04-13 NOTE — Progress Notes (Signed)
1 Day Post-Op  Subjective: Denies nausea/vomiting.  No flatus yet.    Objective: Vital signs in last 24 hours: Temp:  [97.4 F (36.3 C)-98.4 F (36.9 C)] 97.9 F (36.6 C) (10/11 0501) Pulse Rate:  [37-108] 108 (10/11 0501) Resp:  [13-21] 20 (10/11 0501) BP: (111-149)/(72-90) 149/85 (10/11 0501) SpO2:  [96 %-100 %] 98 % (10/11 0501) Weight:  [113.9 kg (251 lb)] 113.9 kg (251 lb) (10/10 1033) Last BM Date: 04/11/16  Intake/Output from previous day: 10/10 0701 - 10/11 0700 In: 2806.3 [I.V.:2206.3] Out: 230 [Urine:180; Blood:50] Intake/Output this shift: No intake/output data recorded.  General appearance: alert, cooperative and no distress Resp: breathing comfortably GI: soft, approp tender at incision.  dressing c/d/i.  bloated  Lab Results:   Recent Labs  04/12/16 1806 04/13/16 0332  WBC 8.8 9.9  HGB 12.1* 11.6*  HCT 39.0 39.2  PLT 189 194   BMET  Recent Labs  04/12/16 1806 04/13/16 0332  NA  --  137  K  --  4.8  CL  --  101  CO2  --  31  GLUCOSE  --  95  BUN  --  6  CREATININE 0.94 0.92  CALCIUM  --  8.6*   PT/INR  Recent Labs  04/12/16 1015  LABPROT 14.6  INR 1.13   ABG No results for input(s): PHART, HCO3 in the last 72 hours.  Invalid input(s): PCO2, PO2  Studies/Results: No results found.  Anti-infectives: Anti-infectives    Start     Dose/Rate Route Frequency Ordered Stop   04/12/16 2200  cefoTEtan (CEFOTAN) 2 g in dextrose 5 % 50 mL IVPB     2 g 100 mL/hr over 30 Minutes Intravenous Every 12 hours 04/12/16 1727 04/12/16 2203   04/12/16 1100  clindamycin (CLEOCIN) 900 mg, gentamicin (GARAMYCIN) 240 mg in sodium chloride 0.9 % 1,000 mL for intraperitoneal lavage  Status:  Discontinued      Intraperitoneal To Surgery 04/11/16 1424 04/12/16 1652   04/12/16 1045  cefoTEtan (CEFOTAN) 2 g in dextrose 5 % 50 mL IVPB  Status:  Discontinued     2 g 100 mL/hr over 30 Minutes Intravenous To Surgery 04/12/16 1032 04/12/16 1033   04/12/16 1045   cefoTEtan (CEFOTAN) 2 g in dextrose 5 % 50 mL IVPB     2 g 100 mL/hr over 30 Minutes Intravenous To Surgery 04/12/16 1037 04/12/16 1315   04/12/16 1029  cefoTEtan in Dextrose 5% (CEFOTAN) 2-2.08 GM-% IVPB    Comments:  Ara Kussmaul   : cabinet override      04/12/16 1029 04/12/16 2244   04/12/16 1014  cefoTEtan (CEFOTAN) 2 g in dextrose 5 % 50 mL IVPB  Status:  Discontinued     2 g 100 mL/hr over 30 Minutes Intravenous On call to O.R. 04/12/16 1014 04/12/16 1035      Assessment/Plan: s/p Procedure(s): LAPAROSCOPIC ILEOCOLECTOMY (N/A) PAS Continue foley due to urinary output monitoring Appears to be a bit bloated with post op ileus.  Stay on low volume clears.    Await return of bowel function.     LOS: 1 day    [facility] 04/13/2016

## 2016-04-14 LAB — BASIC METABOLIC PANEL
ANION GAP: 4 — AB (ref 5–15)
ANION GAP: 5 (ref 5–15)
BUN: 12 mg/dL (ref 6–20)
BUN: 10 mg/dL (ref 6–20)
CALCIUM: 8.7 mg/dL — AB (ref 8.9–10.3)
CALCIUM: 8.5 mg/dL — AB (ref 8.9–10.3)
CHLORIDE: 100 mmol/L — AB (ref 101–111)
CHLORIDE: 98 mmol/L — AB (ref 101–111)
CO2: 29 mmol/L (ref 22–32)
CO2: 30 mmol/L (ref 22–32)
Creatinine, Ser: 1.49 mg/dL — ABNORMAL HIGH (ref 0.61–1.24)
Creatinine, Ser: 1.28 mg/dL — ABNORMAL HIGH (ref 0.61–1.24)
GFR calc non Af Amer: 48 mL/min — ABNORMAL LOW (ref >60)
GFR calc non Af Amer: 58 mL/min — ABNORMAL LOW (ref >60)
GFR, EST AFRICAN AMERICAN: 55 mL/min — AB (ref >60)
GLUCOSE: 109 mg/dL — AB (ref 65–99)
GLUCOSE: 107 mg/dL — AB (ref 65–99)
POTASSIUM: 5.7 mmol/L — AB (ref 3.5–5.1)
POTASSIUM: 4.4 mmol/L (ref 3.5–5.1)
Sodium: 133 mmol/L — ABNORMAL LOW (ref 135–145)
Sodium: 133 mmol/L — ABNORMAL LOW (ref 135–145)

## 2016-04-14 LAB — CBC
HEMATOCRIT: 33.3 % — AB (ref 39.0–52.0)
HEMATOCRIT: 28 % — AB (ref 39.0–52.0)
HEMOGLOBIN: 9.8 g/dL — AB (ref 13.0–17.0)
HEMOGLOBIN: 8.7 g/dL — AB (ref 13.0–17.0)
MCH: 23.6 pg — ABNORMAL LOW (ref 26.0–34.0)
MCH: 24.4 pg — AB (ref 26.0–34.0)
MCHC: 29.4 g/dL — ABNORMAL LOW (ref 30.0–36.0)
MCHC: 31.1 g/dL (ref 30.0–36.0)
MCV: 80.2 fL (ref 78.0–100.0)
MCV: 78.7 fL (ref 78.0–100.0)
Platelets: 178 10*3/uL (ref 150–400)
Platelets: 163 10*3/uL (ref 150–400)
RBC: 4.15 MIL/uL — ABNORMAL LOW (ref 4.22–5.81)
RBC: 3.56 MIL/uL — AB (ref 4.22–5.81)
RDW: 20.2 % — AB (ref 11.5–15.5)
RDW: 20 % — ABNORMAL HIGH (ref 11.5–15.5)
WBC: 10 10*3/uL (ref 4.0–10.5)
WBC: 8.5 10*3/uL (ref 4.0–10.5)

## 2016-04-14 LAB — PREPARE RBC (CROSSMATCH)

## 2016-04-14 MED ORDER — METOPROLOL TARTRATE 12.5 MG HALF TABLET
12.5000 mg | ORAL_TABLET | Freq: Two times a day (BID) | ORAL | Status: DC
  Administered 2016-04-14 – 2016-04-16 (×4): 12.5 mg via ORAL
  Filled 2016-04-14 (×4): qty 1

## 2016-04-14 MED ORDER — DEXTROSE-NACL 5-0.9 % IV SOLN
INTRAVENOUS | Status: DC
  Administered 2016-04-14 – 2016-04-15 (×2): via INTRAVENOUS

## 2016-04-14 MED ORDER — SODIUM CHLORIDE 0.9 % IV SOLN: Freq: Once | INTRAVENOUS | Status: DC

## 2016-04-14 NOTE — Progress Notes (Signed)
Patient ID: Steven Barnett, male   DOB: May 03, 1951, 65 y.o.   MRN: [id number] Notified by RN that he had 2 bloody BMs. Repeat CBC down from this AM. HD stable. ABD some distention but soft. Likely some ooze from his anastamosis. Came off coumadin pre-op. Will TF 1u PRBC and F/U. Usually will stop on its own. Georganna Skeans, MD, MPH, FACS Trauma: [phone] General Surgery: [phone]

## 2016-04-14 NOTE — Progress Notes (Signed)
SATURATION QUALIFICATIONS: (This note is used to comply with regulatory documentation for home oxygen)  Patient Saturations on Room Air at Rest = 93%  Patient Saturations on Room Air while Ambulating = 85%  Patient Saturations on 2 Liters of oxygen while Ambulating = 99%  Please briefly explain why patient needs home oxygen: yes, pt desats while ambulated on room air.

## 2016-04-14 NOTE — Progress Notes (Signed)
Pts HR noted to be frequently dipping in the 30's. BP in the 90's. MD notified. Pt asymptomatic. New orders noted. Will continue to monitor.

## 2016-04-15 LAB — CBC
HEMATOCRIT: 28.5 % — AB (ref 39.0–52.0)
Hemoglobin: 8.9 g/dL — ABNORMAL LOW (ref 13.0–17.0)
MCH: 24.4 pg — ABNORMAL LOW (ref 26.0–34.0)
MCHC: 31.2 g/dL (ref 30.0–36.0)
MCV: 78.1 fL (ref 78.0–100.0)
PLATELETS: 142 10*3/uL — AB (ref 150–400)
RBC: 3.65 MIL/uL — ABNORMAL LOW (ref 4.22–5.81)
RDW: 19.5 % — AB (ref 11.5–15.5)
WBC: 8 10*3/uL (ref 4.0–10.5)

## 2016-04-15 LAB — BASIC METABOLIC PANEL
Anion gap: 6 (ref 5–15)
BUN: 7 mg/dL (ref 6–20)
CALCIUM: 8.5 mg/dL — AB (ref 8.9–10.3)
CO2: 28 mmol/L (ref 22–32)
CREATININE: 0.89 mg/dL (ref 0.61–1.24)
Chloride: 100 mmol/L — ABNORMAL LOW (ref 101–111)
GLUCOSE: 100 mg/dL — AB (ref 65–99)
Potassium: 4.2 mmol/L (ref 3.5–5.1)
Sodium: 134 mmol/L — ABNORMAL LOW (ref 135–145)

## 2016-04-15 MED ORDER — INFLUENZA VAC SPLIT QUAD 0.5 ML IM SUSY
0.5000 mL | PREFILLED_SYRINGE | INTRAMUSCULAR | Status: AC
  Administered 2016-04-16: 0.5 mL via INTRAMUSCULAR
  Filled 2016-04-15: qty 0.5

## 2016-04-15 NOTE — Progress Notes (Signed)
Patient had another large bloody liq. Stool V.S.S. Skin warm and dry. R.N. Aware. and Dr. Grandville Silos page and call return M.D. also aware of CBC. Results. Will come see patient shortly.

## 2016-04-15 NOTE — Progress Notes (Signed)
Foley cath removed as ordered.

## 2016-04-15 NOTE — Progress Notes (Signed)
Patient had a large bloody liq. Stool. V.S.S. Skin warm and dry. R.N. Aware page Dr. Grandville Silos and call return. See orders for Stat CBC .

## 2016-04-15 NOTE — Progress Notes (Signed)
3 Days Post-Op  Subjective: Had BM x 3 yesterday.  Feeling less distended  Objective: Vital signs in last 24 hours: Temp:  [98.1 F (36.7 C)-99.3 F (37.4 C)] 98.4 F (36.9 C) (10/13 0418) Pulse Rate:  [45-84] 84 (10/13 0418) Resp:  [18-20] 20 (10/13 0418) BP: (91-150)/(50-78) 150/50 (10/13 0418) SpO2:  [99 %-100 %] 99 % (10/13 0418) Last BM Date: 04/14/16  Intake/Output from previous day: 10/12 0701 - 10/13 0700 In: 1905 [P.O.:1080; Blood:825] Out: 2850 [Urine:2850] Intake/Output this shift: No intake/output data recorded.  Lungs clear CV IRR Abdomen softer, minimally tender  Lab Results:   Recent Labs  04/14/16 2021 04/15/16 0427  WBC 8.5 8.0  HGB 8.7* 8.9*  HCT 28.0* 28.5*  PLT 163 142*   BMET  Recent Labs  04/14/16 1505 04/15/16 0427  NA 133* 134*  K 4.4 4.2  CL 98* 100*  CO2 30 28  GLUCOSE 107* 100*  BUN 10 7  CREATININE 1.28* 0.89  CALCIUM 8.5* 8.5*   PT/INR  Recent Labs  04/12/16 1015  LABPROT 14.6  INR 1.13   ABG No results for input(s): PHART, HCO3 in the last 72 hours.  Invalid input(s): PCO2, PO2  Studies/Results: No results found.  Anti-infectives: Anti-infectives    Start     Dose/Rate Route Frequency Ordered Stop   04/12/16 2200  cefoTEtan (CEFOTAN) 2 g in dextrose 5 % 50 mL IVPB     2 g 100 mL/hr over 30 Minutes Intravenous Every 12 hours 04/12/16 1727 04/12/16 2203   04/12/16 1100  clindamycin (CLEOCIN) 900 mg, gentamicin (GARAMYCIN) 240 mg in sodium chloride 0.9 % 1,000 mL for intraperitoneal lavage  Status:  Discontinued      Intraperitoneal To Surgery 04/11/16 1424 04/12/16 1652   04/12/16 1045  cefoTEtan (CEFOTAN) 2 g in dextrose 5 % 50 mL IVPB  Status:  Discontinued     2 g 100 mL/hr over 30 Minutes Intravenous To Surgery 04/12/16 1032 04/12/16 1033   04/12/16 1045  cefoTEtan (CEFOTAN) 2 g in dextrose 5 % 50 mL IVPB     2 g 100 mL/hr over 30 Minutes Intravenous To Surgery 04/12/16 1037 04/12/16 1315   04/12/16  1029  cefoTEtan in Dextrose 5% (CEFOTAN) 2-2.08 GM-% IVPB    Comments:  Steven Barnett   : cabinet override      04/12/16 1029 04/12/16 2244   04/12/16 1014  cefoTEtan (CEFOTAN) 2 g in dextrose 5 % 50 mL IVPB  Status:  Discontinued     2 g 100 mL/hr over 30 Minutes Intravenous On call to O.R. 04/12/16 1014 04/12/16 1035      Assessment/Plan: s/p Procedure(s): LAPAROSCOPIC ILEOCOLECTOMY (N/A)  Ileus improving  Advance diet Ambulate Decrease IVF  LOS: 3 days    Steven Barnett A 04/15/2016

## 2016-04-16 LAB — CBC
HEMATOCRIT: 29.6 % — AB (ref 39.0–52.0)
HEMOGLOBIN: 9.3 g/dL — AB (ref 13.0–17.0)
MCH: 24.7 pg — AB (ref 26.0–34.0)
MCHC: 31.4 g/dL (ref 30.0–36.0)
MCV: 78.7 fL (ref 78.0–100.0)
Platelets: 151 10*3/uL (ref 150–400)
RBC: 3.76 MIL/uL — AB (ref 4.22–5.81)
RDW: 20 % — ABNORMAL HIGH (ref 11.5–15.5)
WBC: 6.3 10*3/uL (ref 4.0–10.5)

## 2016-04-16 LAB — BASIC METABOLIC PANEL
Anion gap: 6 (ref 5–15)
BUN: 6 mg/dL (ref 6–20)
CHLORIDE: 99 mmol/L — AB (ref 101–111)
CO2: 31 mmol/L (ref 22–32)
CREATININE: 0.82 mg/dL (ref 0.61–1.24)
Calcium: 8.6 mg/dL — ABNORMAL LOW (ref 8.9–10.3)
GFR calc Af Amer: >60 mL/min (ref >60)
GFR calc non Af Amer: >60 mL/min (ref >60)
Glucose, Bld: 98 mg/dL (ref 65–99)
POTASSIUM: 3.7 mmol/L (ref 3.5–5.1)
Sodium: 136 mmol/L (ref 135–145)

## 2016-04-16 MED ORDER — HYDROCODONE-ACETAMINOPHEN 5-325 MG PO TABS: 1.0000 | ORAL_TABLET | Freq: Four times a day (QID) | ORAL | 0 refills | Status: DC | PRN

## 2016-04-16 NOTE — Progress Notes (Signed)
Steven Barnett to be D/C'd Home per MD order. Discussed with the patient and all questions fully answered.    VVS, Skin clean, dry and intact without evidence of skin break down, no evidence of skin tears noted.  IV catheter discontinued intact. Site without signs and symptoms of complications. Dressing and pressure applied.  An After Visit Summary was printed and given to the patient.  Patient escorted via [place], and D/C home via private auto.  [name] N  04/16/2016 11:00 AM

## 2016-04-16 NOTE — Discharge Summary (Signed)
Physician Discharge Summary  Patient ID: Steven Barnett MRN: [id number] DOB/AGE: 1951/05/24 65 y.o.  Admit date: 04/12/2016 Discharge date: 04/16/2016  Admission Diagnoses:  Discharge Diagnoses:  Active Problems:   Cecal cancer [facility])   Discharged Condition: good  Hospital Course: 65 yo male with cecal cancer and multiple medical problems. Presented to hospital underwent lap partial colectomy. Post op he did well, he had a fib rvr at one time controlled with reg medications and resolved once pain controlled. He had small ileus for POD 2 but resolved and now tolerating diet with +BM. Ambulating well  Consults: cardiology  Significant Diagnostic Studies:   Treatments: surgery: lap partial colectomy  Discharge Exam: Blood pressure 125/66, pulse 86, temperature 98.4 F (36.9 C), temperature source Oral, resp. rate 20, height 5\' 10"  (1.778 m), weight 113.9 kg (251 lb), SpO2 90 %. General appearance: alert and cooperative Head: Normocephalic, without obvious abnormality, atraumatic Neck: no adenopathy, no carotid bruit, no JVD, supple, symmetrical, trachea midline and thyroid not enlarged, symmetric, no tenderness/mass/nodules Resp: clear to auscultation bilaterally Cardio: irreg irreg, rate controlled GI: soft, non-tender; bowel sounds normal; no masses,  no organomegaly, incisions w/o erythema  Disposition: 01-Home or Self Care  Discharge Instructions    Call MD for:  difficulty breathing, headache or visual disturbances    Complete by:  As directed    Call MD for:  hives    Complete by:  As directed    Call MD for:  persistant nausea and vomiting    Complete by:  As directed    Call MD for:  redness, tenderness, or signs of infection (pain, swelling, redness, odor or green/yellow discharge around incision site)    Complete by:  As directed    Call MD for:  severe uncontrolled pain    Complete by:  As directed    Call MD for:  temperature >100.4    Complete by:  As  directed    Diet - low sodium heart healthy    Complete by:  As directed    Discharge instructions    Complete by:  As directed    Ambulate and go up stairs.  Restart warfarin 10/14   Discharge wound care:    Complete by:  As directed    Ok to shower tomorrow. Remove bandages on 10/18, no dressings required after that point   Driving Restrictions    Complete by:  As directed    No driving while on narcotics   Increase activity slowly    Complete by:  As directed    Lifting restrictions    Complete by:  As directed    No lifting greater than 20 pounds for 3 weeks       Medication List    TAKE these medications   acetaminophen 500 MG tablet Commonly known as:  TYLENOL Take 1,000 mg by mouth 2 (two) times daily.   albuterol 108 (90 Base) MCG/ACT inhaler Commonly known as:  PROVENTIL HFA;VENTOLIN HFA Inhale 2 puffs into the lungs every 6 (six) hours as needed for wheezing or shortness of breath.   aspirin EC 81 MG tablet Take 81 mg by mouth daily.   diltiazem 120 MG tablet Commonly known as:  CARDIZEM Take 120 mg by mouth daily.   docusate sodium 100 MG capsule Commonly known as:  COLACE Take 1 capsule (100 mg total) by mouth 2 (two) times daily. For constipation   ferrous sulfate 325 (65 FE) MG tablet Take 1 tablet (325 mg total)  by mouth 2 (two) times daily with a meal.   furosemide 80 MG tablet Commonly known as:  LASIX Take 1 tablet (80 mg total) by mouth 2 (two) times daily.   guaiFENesin 600 MG 12 hr tablet Commonly known as:  MUCINEX Take 1,200 mg by mouth 2 (two) times daily.   guaiFENesin-dextromethorphan 100-10 MG/5ML syrup Commonly known as:  ROBITUSSIN DM Take 5 mLs by mouth every 4 (four) hours as needed for cough.   hydrALAZINE 50 MG tablet Commonly known as:  APRESOLINE TAKE ONE TABLET BY MOUTH TWICE DAILY   HYDROcodone-acetaminophen 5-325 MG tablet Commonly known as:  NORCO/VICODIN Take 1-2 tablets by mouth every 6 (six) hours as needed  for moderate pain.   ipratropium 17 MCG/ACT inhaler Commonly known as:  ATROVENT HFA Inhale 2 puffs into the lungs as needed for wheezing.   lisinopril 20 MG tablet Commonly known as:  PRINIVIL,ZESTRIL TAKE ONE TABLET BY MOUTH ONCE DAILY   metoprolol tartrate 25 MG tablet Commonly known as:  LOPRESSOR Take 1 tablet (25 mg total) by mouth 2 (two) times daily.   potassium chloride SA 20 MEQ tablet Commonly known as:  K-DUR,KLOR-CON Take 2 tablets (40 mEq total) by mouth daily.   sildenafil 20 MG tablet Commonly known as:  REVATIO TAKE TWO TO THREE TABLETS BY MOUTH ONCE DAILY AS NEEDED FOR  SEXUAL  ACTIVITY   warfarin 4 MG tablet Commonly known as:  COUMADIN TAKE TWO TABLETS BY MOUTH ONCE DAILY OR  AS  DIRECTED  BY  THE  COUMADIN  CLINIC   warfarin 4 MG tablet Commonly known as:  COUMADIN TAKE TWO TABLETS BY MOUTH ONCE DAILY OR  AS  DIRECTED  BY  THE  COUMADIN  CLINIC      Follow-up Information    Pixie Casino, MD Follow up in 2 week(s).   Specialty:  Cardiology Contact information: [place] [address] [phone]        [name], MD Follow up in 4 week(s).   Specialty:  General Surgery Contact information: [address] [place] [address] [phone]           Signed: Arta Bruce Kinsinger 04/16/2016, 10:40 AM

## 2016-04-18 LAB — TYPE AND SCREEN
ABO/RH(D): O POS
Antibody Screen: NEGATIVE
UNIT DIVISION: 0
Unit division: 0

## 2016-04-19 ENCOUNTER — Telehealth: Payer: Self-pay | Admitting: General Surgery

## 2016-04-19 ENCOUNTER — Telehealth: Payer: Self-pay | Admitting: *Deleted

## 2016-04-19 NOTE — Telephone Encounter (Signed)
Patient has called to request results of pathology report from his surgery. Asking for status of positive nodes. Forwarded request to MD.

## 2016-04-19 NOTE — Telephone Encounter (Signed)
Discussed pathology with daughter, [name].  Reviewed positive nodes.  He has appt next week with her (10/24).  Advised that we may need port and that if so, it is an outpatient procedure.

## 2016-04-26 ENCOUNTER — Ambulatory Visit (INDEPENDENT_AMBULATORY_CARE_PROVIDER_SITE_OTHER): Payer: Medicaid Other | Admitting: Pharmacist Clinician (PhC)/ Clinical Pharmacy Specialist

## 2016-04-26 ENCOUNTER — Ambulatory Visit: Payer: Medicaid Other | Admitting: Hematology

## 2016-04-26 DIAGNOSIS — I4821 Permanent atrial fibrillation: Secondary | ICD-10-CM

## 2016-04-26 DIAGNOSIS — I482 Chronic atrial fibrillation: Secondary | ICD-10-CM

## 2016-04-26 DIAGNOSIS — Z7901 Long term (current) use of anticoagulants: Secondary | ICD-10-CM | POA: Diagnosis not present

## 2016-04-26 LAB — POCT INR: INR: 2.6

## 2016-04-27 ENCOUNTER — Ambulatory Visit (HOSPITAL_BASED_OUTPATIENT_CLINIC_OR_DEPARTMENT_OTHER): Payer: Medicaid Other | Admitting: Hematology

## 2016-04-27 ENCOUNTER — Ambulatory Visit (HOSPITAL_BASED_OUTPATIENT_CLINIC_OR_DEPARTMENT_OTHER): Payer: Medicaid Other

## 2016-04-27 ENCOUNTER — Encounter: Payer: Self-pay | Admitting: *Deleted

## 2016-04-27 ENCOUNTER — Encounter: Payer: Self-pay | Admitting: Hematology

## 2016-04-27 ENCOUNTER — Telehealth: Payer: Self-pay | Admitting: Hematology

## 2016-04-27 VITALS — BP 146/79 | HR 72 | Temp 97.8°F | Resp 18 | Ht 70.0 in | Wt 259.3 lb

## 2016-04-27 DIAGNOSIS — Z87891 Personal history of nicotine dependence: Secondary | ICD-10-CM

## 2016-04-27 DIAGNOSIS — C180 Malignant neoplasm of cecum: Secondary | ICD-10-CM

## 2016-04-27 DIAGNOSIS — Z800 Family history of malignant neoplasm of digestive organs: Secondary | ICD-10-CM | POA: Diagnosis not present

## 2016-04-27 DIAGNOSIS — E669 Obesity, unspecified: Secondary | ICD-10-CM | POA: Diagnosis not present

## 2016-04-27 DIAGNOSIS — G4733 Obstructive sleep apnea (adult) (pediatric): Secondary | ICD-10-CM

## 2016-04-27 DIAGNOSIS — I509 Heart failure, unspecified: Secondary | ICD-10-CM | POA: Diagnosis not present

## 2016-04-27 DIAGNOSIS — I10 Essential (primary) hypertension: Secondary | ICD-10-CM | POA: Diagnosis not present

## 2016-04-27 DIAGNOSIS — D509 Iron deficiency anemia, unspecified: Secondary | ICD-10-CM

## 2016-04-27 DIAGNOSIS — C182 Malignant neoplasm of ascending colon: Secondary | ICD-10-CM

## 2016-04-27 LAB — COMPREHENSIVE METABOLIC PANEL
ALBUMIN: 3.4 g/dL — AB (ref 3.5–5.0)
ALK PHOS: 135 U/L (ref 40–150)
ALT: 11 U/L (ref 0–55)
ANION GAP: 7 meq/L (ref 3–11)
AST: 16 U/L (ref 5–34)
BILIRUBIN TOTAL: 1.24 mg/dL — AB (ref 0.20–1.20)
BUN: 11.7 mg/dL (ref 7.0–26.0)
CALCIUM: 9.3 mg/dL (ref 8.4–10.4)
CO2: 28 mEq/L (ref 22–29)
CREATININE: 0.9 mg/dL (ref 0.7–1.3)
Chloride: 102 mEq/L (ref 98–109)
EGFR: >90 mL/min/{1.73_m2} (ref >90)
Glucose: 81 mg/dl (ref 70–140)
Potassium: 4.6 mEq/L (ref 3.5–5.1)
Sodium: 138 mEq/L (ref 136–145)
TOTAL PROTEIN: 7.9 g/dL (ref 6.4–8.3)

## 2016-04-27 LAB — CBC WITH DIFFERENTIAL/PLATELET
BASO%: 0.6 % (ref 0.0–2.0)
Basophils Absolute: 0 10*3/uL (ref 0.0–0.1)
EOS%: 3.9 % (ref 0.0–7.0)
Eosinophils Absolute: 0.3 10*3/uL (ref 0.0–0.5)
HEMATOCRIT: 36.6 % — AB (ref 38.4–49.9)
HEMOGLOBIN: 11.5 g/dL — AB (ref 13.0–17.1)
LYMPH#: 0.9 10*3/uL (ref 0.9–3.3)
LYMPH%: 13.3 % — ABNORMAL LOW (ref 14.0–49.0)
MCH: 24.3 pg — ABNORMAL LOW (ref 27.2–33.4)
MCHC: 31.4 g/dL — AB (ref 32.0–36.0)
MCV: 77.4 fL — ABNORMAL LOW (ref 79.3–98.0)
MONO#: 0.7 10*3/uL (ref 0.1–0.9)
MONO%: 10.6 % (ref 0.0–14.0)
NEUT%: 71.6 % (ref 39.0–75.0)
NEUTROS ABS: 4.7 10*3/uL (ref 1.5–6.5)
PLATELETS: 307 10*3/uL (ref 140–400)
RBC: 4.73 10*6/uL (ref 4.20–5.82)
RDW: 19.1 % — AB (ref 11.0–14.6)
WBC: 6.5 10*3/uL (ref 4.0–10.3)

## 2016-04-27 LAB — CEA (IN HOUSE-CHCC): CEA (CHCC-In House): 2.11 ng/mL (ref 0.00–5.00)

## 2016-04-27 LAB — IRON AND TIBC
%SAT: 56 % — ABNORMAL HIGH (ref 20–55)
IRON: 166 ug/dL — AB (ref 42–163)
TIBC: 296 ug/dL (ref 202–409)
UIBC: 130 ug/dL (ref 117–376)

## 2016-04-27 LAB — FERRITIN: FERRITIN: 140 ng/mL (ref 22–316)

## 2016-04-27 NOTE — Progress Notes (Signed)
Oncology Nurse Navigator Documentation  Oncology Nurse Navigator Flowsheets 04/27/2016  Navigator Location [facility]  Referral date to RadOnc/MedOnc 03/10/2016  Navigator Encounter Type Initial MedOnc  Telephone -  Abnormal Finding Date 03/04/2016  Confirmed Diagnosis Date 03/04/2016  Surgery Date -  Multidisiplinary Clinic Date 03/18/2016  Multidisiplinary Clinic Type -  Patient Visit Type MedOnc;Initial  Treatment Phase Pre-Tx/Tx Discussion--will start FOLFOX  Barriers/Navigation Needs Education;Coordination of Care  Education Preparing for Upcoming Surgery/ Treatment  Interventions Coordination of Care---message to Dr. [name] and her nurse for port in next couple weeks  Education Method -  Support Groups/Services GI Support Group  Acuity Level 2  Time Spent with Patient -  Met with patient and wife again after new patient visit. Explained the role of the GI Nurse Navigator again, and provided New Patient Packet with information on: 1. Colon cancer--information on portacath and CEA level  2. Support groups 3. Advanced Directives 4. Fall Safety Plan Answered questions, reviewed current treatment plan using TEACH back and provided emotional support. Provided copy of current treatment plan.  Merceda Elks, RN, BSN GI Oncology [place]

## 2016-04-27 NOTE — Telephone Encounter (Signed)
Appointments scheduled per 10/25 LOS. Patient given AVS report and calendar with future scheduled appointments.  °

## 2016-04-27 NOTE — Progress Notes (Signed)
[name]  Telephone:(336) [phone] Fax:(336) [place] Note   Patient Care Team: Elisabeth Cara, PA-C as PCP - General (Family Medicine) [name], MD as Consulting Physician (General Surgery) Tania Ade, RN as Registered Nurse 04/27/2016  Referral physician: Dr. [name]   CHIEF COMPLAINTS/PURPOSE OF CONSULTATION:  Stage III colon cancer  Oncology History   Cancer of right colon [facility])   Staging form: Colon and Rectum, AJCC 7th Edition   - Clinical stage from 04/12/2016: Stage IIIA (T2, N1, M0) - Signed by [name], MD on 05/01/2016      Cancer of right colon ([place])   03/04/2016 Procedure    COLONOSCOPY: A polypoid and ulcerated non-obstructing large mass was found in the cecum. The mass was noncircumferential. The mass measured three cm in length. In addition, its diameter measured three mm. (Dr. [name])       03/15/2016 Imaging    1. Polypoid cecal mass. No findings of liver metastatic disease or other definite metastatic disease. There are some small adjacent pericecal lymph nodes which are not pathologically enlarged. 2. Adenopathy in the chest is present but was also present in 2010, albeit slightly less prominent. This may be reactive adenopathy related to the chronic exudative right pleural effusion.       04/07/2016 Tumor Marker    Patient's tumor was tested for the following markers: CEA. Results of the tumor marker test revealed 2.7.      04/12/2016 Initial Diagnosis    Cecal cancer ([place])     04/12/2016 Definitive Surgery    Laparoscopic right hemicolectomy (Dr. [name])      04/12/2016 Pathologic Stage    pT2 pN1 pMX--Grade 3 adenocarcinoma with 2/16 nodes positive, clear margins, negative for perineural or lymphvascular invasion; invades muscularis propria Loss of expression MLH1/PMS2       HISTORY OF PRESENTING ILLNESS:  Steven Barnett 65 y.o. male is here because of his recently diagnosed stage III  colon cancer. She is accompanied by her wife to my clinic today.  This was discovered by screening colonoscopy in 03/2016. At the time of his screening colonoscopy, he was found to have anemia and iron deficiency. He has had mild to moderate fatigue and dyspnea on exertion for several months. Colonoscopy showed a large no obstructive mass in the cecum, biopsy showed adenocarcinoma. He was referred to see surgeon Dr. [name] and underwent right hemicolectomy on 04/12/2016. He has been recovering slowly from surgery, still has mild to moderate pain at the incision, his energy level and appetite has not recovered well.   He was hospitalized for CHF in 09/2015 and was found to have pleural effusion, he is on lasix. He is able to do all his ADLs, and light activities, such as house work. He is not very physically active.    MEDICAL HISTORY:  Past Medical History:  Diagnosis Date  . Anemia   . Arthralgia of both knees   . Arthritis    "right knee" (10/01/2015)  . Atrial fibrillation ([place])   . Cancer ([facility])    STAGE 1 COLON CANCER  . Chronic anticoagulation 2010   Coumadin  . Chronic diastolic CHF (congestive heart failure), NYHA class 2 ([place])   . Hypertension   . OSA (obstructive sleep apnea) 2016   "couldn't take the mask during the testing" (10/01/2015)  . Permanent atrial fibrillation ([place]) 2010  . Pneumonia 3/29/2017and june 2017    SURGICAL HISTORY: Past Surgical History:  Procedure Laterality Date  .  COLONOSCOPY WITH PROPOFOL N/A 03/04/2016   Procedure: COLONOSCOPY WITH PROPOFOL;  Surgeon: Carol Ada, MD;  Location: [facility];  Service: Endoscopy;  Laterality: N/A;  . LAPAROSCOPIC PARTIAL COLECTOMY N/A 04/12/2016   Procedure: LAPAROSCOPIC ILEOCOLECTOMY;  Surgeon: [name], MD;  Location: [facility];  Service: General;  Laterality: N/A;  . THORACOTOMY Right 2010    SOCIAL HISTORY: Social History   Social History  . Marital status: Married    Spouse name: Ronnette Juniper  . Number of  children: N/A  . Years of education: N/A   Occupational History  . Not on file.   Social History Main Topics  . Smoking status: Former Smoker    Packs/day: 0.10    Years: 2.00    Types: Cigarettes    Quit date: 12/16/1974  . Smokeless tobacco: Never Used     Comment: "1 pack cigarettes would last me a month"  . Alcohol use 0.0 oz/week     Comment: 10/01/2015 "might have1 6 pack of beer/year"  . Drug use:     Types: Cocaine, Marijuana     Comment: Hx polysubstance abuse, quit 2008  . Sexual activity: Not Currently   Other Topics Concern  . 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   FAMILY HISTORY: Family History  Problem Relation Age of Onset  . Heart disease Father   . Heart failure Father   . Colon polyps Father   . Hypertension Mother   . Diabetes Mother   . Hyperlipidemia Mother   . Colon polyps Mother   . Stroke Maternal Grandmother   . Stroke Sister   . Cancer Sister 13    colon cancer   . Colon polyps Brother   . Cancer Sister 47    colon cancer   . Heart attack Neg Hx     ALLERGIES:  is allergic to no known allergies.  MEDICATIONS:  Current Outpatient Prescriptions  Medication Sig Dispense Refill  . acetaminophen (TYLENOL) 500 MG tablet Take 1,000 mg by mouth 2 (two) times daily.    Marland Kitchen albuterol (PROVENTIL HFA;VENTOLIN HFA) 108 (90 Base) MCG/ACT inhaler Inhale 2 puffs into the lungs every 6 (six) hours as needed for wheezing or shortness of breath. 1 Inhaler 2  . aspirin EC 81 MG tablet Take 81 mg by mouth daily.    Marland Kitchen diltiazem (CARDIZEM) 120 MG tablet Take 120 mg by mouth daily.    Marland Kitchen docusate sodium (COLACE) 100 MG capsule Take 1 capsule (100 mg total) by mouth 2 (two) times daily. For constipation 60 capsule 3  . ferrous sulfate 325 (65 FE) MG tablet Take 1 tablet (325 mg total) by mouth 2 (two) times daily with a meal. 60 tablet 3  . furosemide (LASIX) 80 MG tablet Take  1 tablet (80 mg total) by mouth 2 (two) times daily. 60 tablet 1  . guaiFENesin (MUCINEX) 600 MG 12 hr tablet Take 1,200 mg by mouth 2 (two) times daily.    Marland Kitchen guaiFENesin-dextromethorphan (ROBITUSSIN DM) 100-10 MG/5ML syrup Take 5 mLs by mouth every 4 (four) hours as needed for cough. 118 mL 0  . hydrALAZINE (APRESOLINE) 50 MG tablet TAKE ONE TABLET BY MOUTH TWICE DAILY 60 tablet 6  . HYDROcodone-acetaminophen (NORCO/VICODIN) 5-325 MG tablet Take 1-2 tablets by mouth every 6 (six) hours as needed for moderate pain. 30 tablet 0  . ipratropium (ATROVENT HFA) 17 MCG/ACT inhaler Inhale 2  puffs into the lungs as needed for wheezing.    Marland Kitchen lisinopril (PRINIVIL,ZESTRIL) 20 MG tablet TAKE ONE TABLET BY MOUTH ONCE DAILY 30 tablet 11  . metoprolol tartrate (LOPRESSOR) 25 MG tablet Take 1 tablet (25 mg total) by mouth 2 (two) times daily. 30 tablet 5  . potassium chloride SA (K-DUR,KLOR-CON) 20 MEQ tablet Take 2 tablets (40 mEq total) by mouth daily. 180 tablet 3  . sildenafil (REVATIO) 20 MG tablet TAKE TWO TO THREE TABLETS BY MOUTH ONCE DAILY AS NEEDED FOR  SEXUAL  ACTIVITY 30 tablet 2  . warfarin (COUMADIN) 4 MG tablet TAKE TWO TABLETS BY MOUTH ONCE DAILY OR  AS  DIRECTED  BY  THE  COUMADIN  CLINIC 60 tablet 0  . warfarin (COUMADIN) 4 MG tablet TAKE TWO TABLETS BY MOUTH ONCE DAILY OR  AS  DIRECTED  BY  THE  COUMADIN  CLINIC 60 tablet 0   No current facility-administered medications for this visit.     REVIEW OF SYSTEMS:   Constitutional: Denies fevers, chills or abnormal night sweats, (+) fatigue  Eyes: Denies blurriness of vision, double vision or watery eyes Ears, nose, mouth, throat, and face: Denies mucositis or sore throat Respiratory: Denies cough, dyspnea or wheezes Cardiovascular: Denies palpitation, chest discomfort or lower extremity swelling, (+) dyspnea on exertion Gastrointestinal:  Denies nausea, heartburn or change in bowel habits Skin: Denies abnormal skin rashes Lymphatics: Denies new  lymphadenopathy or easy bruising Neurological:Denies numbness, tingling or new weaknesses Behavioral/Psych: Mood is stable, no new changes  All other systems were reviewed with the patient and are negative.  PHYSICAL EXAMINATION: ECOG PERFORMANCE STATUS: 2 - Symptomatic, <50% confined to bed  Vitals:   04/27/16 0941  BP: (!) 146/79  Pulse: 72  Resp: 18  Temp: 97.8 F (36.6 C)   Filed Weights   04/27/16 0941  Weight: 259 lb 4.8 oz (117.6 kg)    GENERAL:alert, no distress and comfortable SKIN: skin color, texture, turgor are normal, no rashes or significant lesions EYES: normal, conjunctiva are pink and non-injected, sclera clear OROPHARYNX:no exudate, no erythema and lips, buccal mucosa, and tongue normal  NECK: supple, thyroid normal size, non-tender, without nodularity LYMPH:  no palpable lymphadenopathy in the cervical, axillary or inguinal LUNGS: clear to auscultation and percussion with normal breathing effort HEART: regular rate & rhythm and no murmurs and no lower extremity edema ABDOMEN:abdomen soft, non-tender and normal bowel sounds. (+) midline incision appears healing well, no discharge, (+) staples  Musculoskeletal:no cyanosis of digits and no clubbing  PSYCH: alert & oriented x 3 with fluent speech NEURO: no focal motor/sensory deficits     LABORATORY DATA:  I have reviewed the data as listed CBC Latest Ref Rng & Units 04/27/2016 04/16/2016 04/15/2016  WBC 4.0 - 10.3 10e3/uL 6.5 6.3 8.0  Hemoglobin 13.0 - 17.1 g/dL 11.5(L) 9.3(L) 8.9(L)  Hematocrit 38.4 - 49.9 % 36.6(L) 29.6(L) 28.5(L)  Platelets 140 - 400 10e3/uL 307 151 142(L)   CMP Latest Ref Rng & Units 04/27/2016 04/16/2016 04/15/2016  Glucose 70 - 140 mg/dl 81 98 100(H)  BUN 7.0 - 26.0 mg/dL 11._0 Creatinine 0.7 - 1.3 mg/dL 0.9 0.82 0.89  Sodium 136 - 145 mEq/L 138 136 134(L)  Potassium 3.5 - 5.1 mEq/L 4.6 3.7 4.2  Chloride 101 - 111 mmol/L - 99(L) 100(L)  CO2 22 - 29 mEq/L _1 Calcium  8.4 - 10.4 mg/dL 9.3 8.6(L) 8.5(L)  Total Protein 6.4 - 8.3 g/dL 7.9 - -  Total Bilirubin 0.20 - 1.20 mg/dL 1.24(H) - -  Alkaline Phos 40 - 150 U/L 135 - -  AST 5 - 34 U/L 16 - -  ALT 0 - 55 U/L 11 - -   CEA: 04/07/16: 2.7 04/27/2016: 2.11   PATHOLOGY  Diagnosis 04/12/2016 Colon, segmental resection for tumor, Right and Terminal Ileum HIGH GRADE ADENOCARCINOMA OF THE CECUM (3 CM), GRADE 3 THE CARCINOMA INVADES MUSCULARIS PROPRIA (PT2) ALL MARGINS OF RESECTION ARE NEGATIVE FOR TUMOR METASTATIC ADENOCARCINOMA IN TWO OF SIXTEEN LYMPH NODES (2/16) UNREMARKABLE APPENDIX Microscopic Comment COLON AND RECTUM (INCLUDING TRANS-ANAL RESECTION): Specimen: Right colon and terminal ileum Procedure: Segmental resection Tumor site: Cecum Specimen integrity: Intact Macroscopic intactness of mesorectum: Not applicable: x Complete: NA Near complete: NA Incomplete: NA Cannot be determined (specify): NA Macroscopic tumor perforation: Invasion of the muscularis propria Invasive tumor: Maximum size: 3 cm Histologic type(s): Adenocarcinoma Histologic grade and differentiation: G3 G1: well differentiated/low grade G2: moderately differentiated/low grade G3: poorly differentiated/high grade G4: undifferentiated/high grade Type of polyp in which invasive carcinoma arose: Tubular adenoma 1 of 5 Supplemental copy SUPPLEMENTAL for HAEDYN, ANCRUM ([id number]) Microscopic Comment(continued) Microscopic extension of invasive tumor: Muscularis propria Lymph-Vascular invasion: Negative Peri-neural invasion: Negative Tumor deposit(s) (discontinuous extramural extension): Negative Resection margins: Proximal margin: Negative Distal margin: Negative Circumferential (radial) (posterior ascending, posterior descending; lateral and posterior mid-rectum; and entire lower 1/3 rectum):Negative Mesenteric margin (sigmoid and transverse): NA Distance closest margin (if all above margins negative): 3  cm Trans-anal resection margins only: Deep margin: NA Mucosal Margin: NA Distance closest mucosal margin (if negative): NA Treatment effect (neo-adjuvant therapy): Negative Additional polyp(s): Negative Non-neoplastic findings: Unremarkable Lymph nodes: number examined 16; number positive: 2 Pathologic Staging: pT2, pN1, Mx  RADIOGRAPHIC STUDIES: I have personally reviewed the radiological images as listed and agreed with the findings in the report.  CT chest, abdomen and pelvis 03/15/2016 IMPRESSION: 1. Polypoid cecal mass. No findings of liver metastatic disease or other definite metastatic disease. There are some small adjacent pericecal lymph nodes which are not pathologically enlarged. 2. Adenopathy in the chest is present but was also present in 2010, albeit slightly less prominent. This may be reactive adenopathy related to the chronic exudative right pleural effusion. 3. Prominent dilatation of the esophagus, nonspecific. Causes might include achalasia, neuropathy, scleroderma, pseudoachalasia, stricture, presbyesophagus, or Chagas disease. 4. Moderate 4 chamber cardiomegaly. 5. Liver morphology is suggestive but not absolutely diagnostic for early cirrhosis. 6.  Prominent stool throughout the colon favors constipation. 7. Left iliopsoas bursitis. 8. Possible varicoceles in the scrotum bilaterally. 9. Impingement at L4-5 due to spondylosis and degenerative disc disease.   Colonoscopy 03/04/2016 Dr. [name]  A polypoid and ulcerated non-obstructing large mass was found in the cecum. The mass was noncircumferential. The mass measured three cm in length. In addition, its diameter measured three mm. No bleeding was present. This was biopsied with a cold forceps for histology. Findings: A 2 mm polyp was found in the transverse colon. The polyp was sessile. The polyp was removed with a cold biopsy forceps. Resection and retrieval were complete. A small ascending colon polyp was  identified, image #3, near the mass. This polyp was not removed as it is anticipated that it will be removed with the mass surgically. - Likely malignant tumor in the cecum. Biopsied. - One 2 mm polyp in the transverse colon, removed with a cold biopsy forceps. Resected and retrieved.   ASSESSMENT & PLAN:  65 year old male, with past medical history of hypertension, OSA,  CHF, obesity, presented with fatigue, dyspnea on exertion, and anemia. Screening colonoscopy showed a cecal mass.  1. Right colon cancer, invasive adenocarcinoma,G3,  pT2N1M0, stage IIIA, MSI pending -I reviewed his CT scan findings, and surgical pathology results in great details with patient and her family members -He had a complete surgical resection -We discussed the risk of cancer recurrence after surgery. Due to his stage IIIA disease, he is at moderate to high risk of recurrence -We discussed the standard adjuvant chemotherapy for stage III colon cancer,  reduce the risk of cancer recurrence. -we discussed the regimens for adjuvant chemotherapy, including FOLFOX, CAPOX, or single agent Xeloda or 5-FU. Due to his comorbidities, I recommend him to consider FOLFOX -We discussed the duration of adjuvant chemotherapy. Based on the recent reported IDEA study in ASCO 2017, I recommend 3 months therapy.  --Chemotherapy consent: Side effects including but does not not limited to, fatigue, nausea, vomiting, diarrhea, hair loss, neuropathy, fluid retention, renal and kidney dysfunction, neutropenic fever, needed for blood transfusion, bleeding, coronary artery spasm and heart attack, were discussed with patient in great detail. He agrees to proceed. -He has been recovering slowly from his surgery, I will revaluate him in 2 weeks, and probably start him on chemotherapy in mid to late November  2. Iron deficient anemia -Secondary to his colon cancer -We'll repeat his iron study and CBC -Continue oral iron pill  3. HTN, OSA, CHF,  obesity -He will continue follow-up with his primary care physician  Plan -Lab today -chemo class, lab and follow-up in 2 weeks  -port placement    All questions were answered. The patient knows to call the clinic with any problems, questions or concerns. I spent 55 minutes counseling the patient face to face. The total time spent in the appointment was 60 minutes and more than 50% was on counseling.     [name], MD 04/27/2016 9:52 AM

## 2016-04-27 NOTE — Patient Instructions (Signed)
Care Plan Summary- 04/27/2016 Name:  Steven Barnett      DOB:  1951-01-10 Your Medical Team:  Medical Oncologist:  Dr. [name] Radiation Oncologist:   Surgeon:   Dr. [name] Type of Cancer: Adenocarcinoma of Sigmoid Colon  Stage/Grade: Stage III *Exact staging of your cancer is based on size of the tumor, depth of invasion, involvement of lymph nodes or not, and whether or not the cancer has spread beyond the primary site   Recommendations: Based on information available as of today's consult. Recommendations may change depending on the results of further tests or exams. 1) Chemotherapy with FOLFOX regimen 2) Genetics counselor referral 3)  4)  Next Steps:       1) Labs today 2) Schedule for chemo education class 3) Dr. Marlowe Aschoff office will call regarding port placement 4) Return to Dr. Burr Medico in 2 weeks   Questions? Merceda Elks, RN, BSN at [phone]. Steven Barnett is your Oncology Nurse Navigator and is available to assist you while you're receiving your medical care at [facility].

## 2016-04-28 ENCOUNTER — Other Ambulatory Visit: Payer: Self-pay | Admitting: Internal Medicine

## 2016-04-29 NOTE — Telephone Encounter (Signed)
Rx(s) sent to pharmacy electronically.  

## 2016-05-01 ENCOUNTER — Encounter: Payer: Self-pay | Admitting: Hematology

## 2016-05-04 ENCOUNTER — Other Ambulatory Visit: Payer: Self-pay | Admitting: General Surgery

## 2016-05-10 ENCOUNTER — Other Ambulatory Visit: Payer: Self-pay | Admitting: General Surgery

## 2016-05-11 ENCOUNTER — Encounter: Payer: Self-pay | Admitting: Hematology

## 2016-05-11 ENCOUNTER — Other Ambulatory Visit: Payer: Self-pay | Admitting: Internal Medicine

## 2016-05-11 ENCOUNTER — Encounter: Payer: Self-pay | Admitting: *Deleted

## 2016-05-11 ENCOUNTER — Other Ambulatory Visit: Payer: Medicaid Other

## 2016-05-11 ENCOUNTER — Ambulatory Visit (HOSPITAL_BASED_OUTPATIENT_CLINIC_OR_DEPARTMENT_OTHER): Payer: Medicaid Other | Admitting: Hematology

## 2016-05-11 ENCOUNTER — Other Ambulatory Visit (HOSPITAL_BASED_OUTPATIENT_CLINIC_OR_DEPARTMENT_OTHER): Payer: Medicaid Other

## 2016-05-11 VITALS — BP 127/76 | HR 67 | Temp 97.9°F | Resp 18 | Ht 70.0 in | Wt 251.0 lb

## 2016-05-11 DIAGNOSIS — D509 Iron deficiency anemia, unspecified: Secondary | ICD-10-CM

## 2016-05-11 DIAGNOSIS — I10 Essential (primary) hypertension: Secondary | ICD-10-CM

## 2016-05-11 DIAGNOSIS — G4733 Obstructive sleep apnea (adult) (pediatric): Secondary | ICD-10-CM | POA: Diagnosis not present

## 2016-05-11 DIAGNOSIS — C180 Malignant neoplasm of cecum: Secondary | ICD-10-CM

## 2016-05-11 DIAGNOSIS — E119 Type 2 diabetes mellitus without complications: Secondary | ICD-10-CM

## 2016-05-11 DIAGNOSIS — I4821 Permanent atrial fibrillation: Secondary | ICD-10-CM

## 2016-05-11 DIAGNOSIS — E669 Obesity, unspecified: Secondary | ICD-10-CM

## 2016-05-11 DIAGNOSIS — I5033 Acute on chronic diastolic (congestive) heart failure: Secondary | ICD-10-CM | POA: Diagnosis not present

## 2016-05-11 DIAGNOSIS — C182 Malignant neoplasm of ascending colon: Secondary | ICD-10-CM

## 2016-05-11 LAB — COMPREHENSIVE METABOLIC PANEL
ALT: 16 U/L (ref 0–55)
ANION GAP: 8 meq/L (ref 3–11)
AST: 21 U/L (ref 5–34)
Albumin: 3.6 g/dL (ref 3.5–5.0)
Alkaline Phosphatase: 115 U/L (ref 40–150)
BUN: 14 mg/dL (ref 7.0–26.0)
CALCIUM: 9.4 mg/dL (ref 8.4–10.4)
CHLORIDE: 100 meq/L (ref 98–109)
CO2: 30 meq/L — AB (ref 22–29)
CREATININE: 1 mg/dL (ref 0.7–1.3)
EGFR: 87 mL/min/{1.73_m2} — ABNORMAL LOW (ref >90)
Glucose: 86 mg/dl (ref 70–140)
POTASSIUM: 4.5 meq/L (ref 3.5–5.1)
Sodium: 138 mEq/L (ref 136–145)
Total Bilirubin: 0.74 mg/dL (ref 0.20–1.20)
Total Protein: 8 g/dL (ref 6.4–8.3)

## 2016-05-11 LAB — CBC WITH DIFFERENTIAL/PLATELET
BASO%: 2.3 % — AB (ref 0.0–2.0)
BASOS ABS: 0.1 10*3/uL (ref 0.0–0.1)
EOS%: 4.5 % (ref 0.0–7.0)
Eosinophils Absolute: 0.2 10*3/uL (ref 0.0–0.5)
HCT: 42.1 % (ref 38.4–49.9)
HGB: 13 g/dL (ref 13.0–17.1)
LYMPH%: 19.4 % (ref 14.0–49.0)
MCH: 24.2 pg — AB (ref 27.2–33.4)
MCHC: 31 g/dL — AB (ref 32.0–36.0)
MCV: 78 fL — ABNORMAL LOW (ref 79.3–98.0)
MONO#: 0.6 10*3/uL (ref 0.1–0.9)
MONO%: 11.7 % (ref 0.0–14.0)
NEUT#: 3.2 10*3/uL (ref 1.5–6.5)
NEUT%: 62.1 % (ref 39.0–75.0)
PLATELETS: 284 10*3/uL (ref 140–400)
RBC: 5.39 10*6/uL (ref 4.20–5.82)
RDW: 17.8 % — ABNORMAL HIGH (ref 11.0–14.6)
WBC: 5.2 10*3/uL (ref 4.0–10.3)
lymph#: 1 10*3/uL (ref 0.9–3.3)

## 2016-05-11 MED ORDER — LORAZEPAM 0.5 MG PO TABS: 0.5000 mg | ORAL_TABLET | Freq: Four times a day (QID) | ORAL | 0 refills | Status: DC | PRN

## 2016-05-11 MED ORDER — LIDOCAINE-PRILOCAINE 2.5-2.5 % EX CREA: TOPICAL_CREAM | CUTANEOUS | 3 refills | Status: DC

## 2016-05-11 MED ORDER — ONDANSETRON HCL 8 MG PO TABS: 8.0000 mg | ORAL_TABLET | Freq: Two times a day (BID) | ORAL | 1 refills | Status: DC | PRN

## 2016-05-11 MED ORDER — PROCHLORPERAZINE MALEATE 10 MG PO TABS: 10.0000 mg | ORAL_TABLET | Freq: Four times a day (QID) | ORAL | 1 refills | Status: DC | PRN

## 2016-05-11 NOTE — Progress Notes (Signed)
Steven Barnett  Telephone:(336) [phone] Fax:([phone]) [phone]  Clinic Follow up Note   Patient Care Team: Elisabeth Cara, PA-C as PCP - General (Family Medicine) [name], MD as Consulting Physician (General Surgery) Tania Ade, RN as Registered Nurse 05/11/2016   CHIEF COMPLAINTS:  Stage III colon cancer  Oncology History   Cancer of right colon [facility])   Staging form: Colon and Rectum, AJCC 7th Edition   - Clinical stage from 04/12/2016: Stage IIIA (T2, N1, M0) - Signed by [name], MD on 05/01/2016      Cancer of right colon ([place])   03/04/2016 Procedure    COLONOSCOPY: A polypoid and ulcerated non-obstructing large mass was found in the cecum. The mass was noncircumferential. The mass measured three cm in length. In addition, its diameter measured three mm. (Dr. [name])       03/15/2016 Imaging    1. Polypoid cecal mass. No findings of liver metastatic disease or other definite metastatic disease. There are some small adjacent pericecal lymph nodes which are not pathologically enlarged. 2. Adenopathy in the chest is present but was also present in 2010, albeit slightly less prominent. This may be reactive adenopathy related to the chronic exudative right pleural effusion.       04/07/2016 Tumor Marker    Patient's tumor was tested for the following markers: CEA. Results of the tumor marker test revealed 2.7.      04/12/2016 Initial Diagnosis    Cecal cancer ([place])     04/12/2016 Definitive Surgery    Laparoscopic right hemicolectomy (Dr. [name])      04/12/2016 Pathologic Stage    pT2 pN1 pMX--Grade 3 adenocarcinoma with 2/16 nodes positive, clear margins, negative for perineural or lymphvascular invasion; invades muscularis propria Loss of expression MLH1/PMS2       HISTORY OF PRESENTING ILLNESS (04/27/2016):  Georgiann Cocker 65 y.o. male is here because of his recently diagnosed stage III colon cancer. She is accompanied by her wife to my  clinic today.  This was discovered by screening colonoscopy in 03/2016. At the time of his screening colonoscopy, he was found to have anemia and iron deficiency. He has had mild to moderate fatigue and dyspnea on exertion for several months. Colonoscopy showed a large no obstructive mass in the cecum, biopsy showed adenocarcinoma. He was referred to see surgeon Dr. [name] and underwent right hemicolectomy on 04/12/2016. He has been recovering slowly from surgery, still has mild to moderate pain at the incision, his energy level and appetite has not recovered well.   He was hospitalized for CHF in 09/2015 and was found to have pleural effusion, he is on lasix. He is able to do all his ADLs, and light activities, such as house work. He is not very physically active.    CURRENT THERAPY: Pending adjuvant chemotherapy FOLFOX  INTERIM HISTORY:  Mr. Tapp returns for follow-up. He is accompanied by his wife and daughter to the clinic today. He has recovered better from his surgery, still has mild fatigue and moderate appetite, but overall improved. His weight is stable, no complaints of pain or other symptoms. He is not very physically active.  MEDICAL HISTORY:  Past Medical History:  Diagnosis Date  . Anemia   . Arthralgia of both knees   . Arthritis    "right knee" (10/01/2015)  . Atrial fibrillation ([place])   . Cancer (HCC)    STAGE 1 COLON CANCER  . Chronic anticoagulation 2010   Coumadin  . Chronic  diastolic CHF (congestive heart failure), NYHA class 2 ([place])   . Hypertension   . OSA (obstructive sleep apnea) 2016   "couldn't take the mask during the testing" (10/01/2015)  . Permanent atrial fibrillation ([place]) 2010  . Pneumonia 3/29/2017and june 2017    SURGICAL HISTORY: Past Surgical History:  Procedure Laterality Date  . COLONOSCOPY WITH PROPOFOL N/A 03/04/2016   Procedure: COLONOSCOPY WITH PROPOFOL;  Surgeon: Carol Ada, MD;  Location: [facility];  Service: Endoscopy;  Laterality:  N/A;  . LAPAROSCOPIC PARTIAL COLECTOMY N/A 04/12/2016   Procedure: LAPAROSCOPIC ILEOCOLECTOMY;  Surgeon: [name], MD;  Location: [place];  Service: General;  Laterality: N/A;  . THORACOTOMY Right 2010    SOCIAL HISTORY: Social History   Social History  . Marital status: Married    Spouse name: Ronnette Juniper  . Number of children: N/A  . Years of education: N/A   Occupational History  . Not on file.   Social History Main Topics  . Smoking status: Former Smoker    Packs/day: 0.10    Years: 2.00    Types: Cigarettes    Quit date: 12/16/1974  . Smokeless tobacco: Never Used     Comment: "1 pack cigarettes would last me a month"  . Alcohol use 0.0 oz/week     Comment: 10/01/2015 "might have1 6 pack of beer/year"  . Drug use:     Types: Cocaine, Marijuana     Comment: Hx polysubstance abuse, quit 2008  . Sexual activity: Not Currently   Other Topics Concern  . 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   FAMILY HISTORY: Family History  Problem Relation Age of Onset  . Heart disease Father   . Heart failure Father   . Colon polyps Father   . Hypertension Mother   . Diabetes Mother   . Hyperlipidemia Mother   . Colon polyps Mother   . Stroke Maternal Grandmother   . Stroke Sister   . Cancer Sister 48    colon cancer   . Colon polyps Brother   . Cancer Sister 75    colon cancer   . Heart attack Neg Hx     ALLERGIES:  is allergic to no known allergies.  MEDICATIONS:  Current Outpatient Prescriptions  Medication Sig Dispense Refill  . acetaminophen (TYLENOL) 500 MG tablet Take 1,000 mg by mouth 2 (two) times daily.    Marland Kitchen albuterol (PROVENTIL HFA;VENTOLIN HFA) 108 (90 Base) MCG/ACT inhaler Inhale 2 puffs into the lungs every 6 (six) hours as needed for wheezing or shortness of breath. 1 Inhaler 2  . aspirin EC 81 MG tablet Take 81 mg by mouth daily.    Marland Kitchen diltiazem (CARDIZEM) 120 MG  tablet Take 120 mg by mouth daily.    Marland Kitchen docusate sodium (COLACE) 100 MG capsule Take 1 capsule (100 mg total) by mouth 2 (two) times daily. For constipation 60 capsule 3  . ferrous sulfate 325 (65 FE) MG tablet Take 1 tablet (325 mg total) by mouth 2 (two) times daily with a meal. 60 tablet 3  . furosemide (LASIX) 80 MG tablet Take 1 tablet (80 mg total) by mouth 2 (two) times daily. 60 tablet 1  . guaiFENesin (MUCINEX) 600 MG 12 hr tablet Take 1,200 mg by mouth 2 (two) times daily.    Marland Kitchen guaiFENesin-dextromethorphan (ROBITUSSIN DM) 100-10 MG/5ML syrup Take 5 mLs by mouth every 4 (four) hours  as needed for cough. 118 mL 0  . hydrALAZINE (APRESOLINE) 50 MG tablet TAKE ONE TABLET BY MOUTH TWICE DAILY 60 tablet 6  . HYDROcodone-acetaminophen (NORCO/VICODIN) 5-325 MG tablet Take 1-2 tablets by mouth every 6 (six) hours as needed for moderate pain. 30 tablet 0  . ipratropium (ATROVENT HFA) 17 MCG/ACT inhaler Inhale 2 puffs into the lungs as needed for wheezing.    Marland Kitchen lisinopril (PRINIVIL,ZESTRIL) 20 MG tablet TAKE ONE TABLET BY MOUTH ONCE DAILY 30 tablet 10  . metoprolol tartrate (LOPRESSOR) 25 MG tablet Take 1 tablet (25 mg total) by mouth 2 (two) times daily. 30 tablet 5  . potassium chloride SA (K-DUR,KLOR-CON) 20 MEQ tablet Take 2 tablets (40 mEq total) by mouth daily. 180 tablet 3  . sildenafil (REVATIO) 20 MG tablet TAKE TWO TO THREE TABLETS BY MOUTH ONCE DAILY AS NEEDED FOR  SEXUAL  ACTIVITY 30 tablet 2  . warfarin (COUMADIN) 4 MG tablet TAKE TWO TABLETS BY MOUTH ONCE DAILY OR  AS  DIRECTED  BY  THE  COUMADIN  CLINIC 60 tablet 0  . warfarin (COUMADIN) 4 MG tablet TAKE TWO TABLETS BY MOUTH ONCE DAILY OR  AS  DIRECTED  BY  THE  COUMADIN  CLINIC 60 tablet 0  . lidocaine-prilocaine (EMLA) cream Apply to affected area once 30 g 3  . LORazepam (ATIVAN) 0.5 MG tablet Take 1 tablet (0.5 mg total) by mouth every 6 (six) hours as needed (Nausea or vomiting). 30 tablet 0  . ondansetron (ZOFRAN) 8 MG tablet  Take 1 tablet (8 mg total) by mouth 2 (two) times daily as needed for refractory nausea / vomiting. Start on day 3 after chemotherapy. 30 tablet 1  . prochlorperazine (COMPAZINE) 10 MG tablet Take 1 tablet (10 mg total) by mouth every 6 (six) hours as needed (Nausea or vomiting). 30 tablet 1   No current facility-administered medications for this visit.     REVIEW OF SYSTEMS:   Constitutional: Denies fevers, chills or abnormal night sweats, (+) fatigue  Eyes: Denies blurriness of vision, double vision or watery eyes Ears, nose, mouth, throat, and face: Denies mucositis or sore throat Respiratory: Denies cough, dyspnea or wheezes Cardiovascular: Denies palpitation, chest discomfort or lower extremity swelling, (+) dyspnea on exertion Gastrointestinal:  Denies nausea, heartburn or change in bowel habits Skin: Denies abnormal skin rashes Lymphatics: Denies new lymphadenopathy or easy bruising Neurological:Denies numbness, tingling or new weaknesses Behavioral/Psych: Mood is stable, no new changes  All other systems were reviewed with the patient and are negative.  PHYSICAL EXAMINATION: ECOG PERFORMANCE STATUS: 2 - Symptomatic, <50% confined to bed  Vitals:   05/11/16 1303  BP: 127/76  Pulse: 67  Resp: 18  Temp: 97.9 F (36.6 C)   Filed Weights   05/11/16 1303  Weight: 251 lb (113.9 kg)    GENERAL:alert, no distress and comfortable SKIN: skin color, texture, turgor are normal, no rashes or significant lesions EYES: normal, conjunctiva are pink and non-injected, sclera clear OROPHARYNX:no exudate, no erythema and lips, buccal mucosa, and tongue normal  NECK: supple, thyroid normal size, non-tender, without nodularity LYMPH:  no palpable lymphadenopathy in the cervical, axillary or inguinal LUNGS: clear to auscultation and percussion with normal breathing effort HEART: regular rate & rhythm and no murmurs and no lower extremity edema ABDOMEN:abdomen soft, non-tender and normal  bowel sounds. (+) midline incision appears healing well, no discharge, (+) staples  Musculoskeletal:no cyanosis of digits and no clubbing  PSYCH: alert & oriented x 3 with fluent  speech NEURO: no focal motor/sensory deficits     LABORATORY DATA:  I have reviewed the data as listed CBC Latest Ref Rng & Units 05/11/2016 04/27/2016 04/16/2016  WBC 4.0 - 10.3 10e3/uL 5.2 6.5 6.3  Hemoglobin 13.0 - 17.1 g/dL 13.0 11.5(L) 9.3(L)  Hematocrit 38.4 - 49.9 % 42.1 36.6(L) 29.6(L)  Platelets 140 - 400 10e3/uL 284 307 151   CMP Latest Ref Rng & Units 05/11/2016 04/27/2016 04/16/2016  Glucose 70 - 140 mg/dl 86 81 98  BUN 7.0 - 26.0 mg/dL 14.0 11.7 6  Creatinine 0.7 - 1.3 mg/dL 1.0 0.9 0.82  Sodium 136 - 145 mEq/L 138 138 136  Potassium 3.5 - 5.1 mEq/L 4.5 4.6 3.7  Chloride 101 - 111 mmol/L - - 99(L)  CO2 22 - 29 mEq/L 30(H) 28 31  Calcium 8.4 - 10.4 mg/dL 9.4 9.3 8.6(L)  Total Protein 6.4 - 8.3 g/dL 8.0 7.9 -  Total Bilirubin 0.20 - 1.20 mg/dL 0.74 1.24(H) -  Alkaline Phos 40 - 150 U/L 115 135 -  AST 5 - 34 U/L 21 16 -  ALT 0 - 55 U/L 16 11 -   CEA: 04/07/16: 2.7 04/27/2016: 2.11   PATHOLOGY  Diagnosis 04/12/2016 Colon, segmental resection for tumor, Right and Terminal Ileum HIGH GRADE ADENOCARCINOMA OF THE CECUM (3 CM), GRADE 3 THE CARCINOMA INVADES MUSCULARIS PROPRIA (PT2) ALL MARGINS OF RESECTION ARE NEGATIVE FOR TUMOR METASTATIC ADENOCARCINOMA IN TWO OF SIXTEEN LYMPH NODES (2/16) UNREMARKABLE APPENDIX Microscopic Comment COLON AND RECTUM (INCLUDING TRANS-ANAL RESECTION): Specimen: Right colon and terminal ileum Procedure: Segmental resection Tumor site: Cecum Specimen integrity: Intact Macroscopic intactness of mesorectum: Not applicable: x Complete: NA Near complete: NA Incomplete: NA Cannot be determined (specify): NA Macroscopic tumor perforation: Invasion of the muscularis propria Invasive tumor: Maximum size: 3 cm Histologic type(s): Adenocarcinoma Histologic grade  and differentiation: G3 G1: well differentiated/low grade G2: moderately differentiated/low grade G3: poorly differentiated/high grade G4: undifferentiated/high grade Type of polyp in which invasive carcinoma arose: Tubular adenoma 1 of 5 Supplemental copy SUPPLEMENTAL for DAMIEL, BARTHOLD ([id number]) Microscopic Comment(continued) Microscopic extension of invasive tumor: Muscularis propria Lymph-Vascular invasion: Negative Peri-neural invasion: Negative Tumor deposit(s) (discontinuous extramural extension): Negative Resection margins: Proximal margin: Negative Distal margin: Negative Circumferential (radial) (posterior ascending, posterior descending; lateral and posterior mid-rectum; and entire lower 1/3 rectum):Negative Mesenteric margin (sigmoid and transverse): NA Distance closest margin (if all above margins negative): 3 cm Trans-anal resection margins only: Deep margin: NA Mucosal Margin: NA Distance closest mucosal margin (if negative): NA Treatment effect (neo-adjuvant therapy): Negative Additional polyp(s): Negative Non-neoplastic findings: Unremarkable Lymph nodes: number examined 16; number positive: 2 Pathologic Staging: pT2, pN1, Mx  MMR: (+) loss of MLH1 and PMS2  MSI-H  RADIOGRAPHIC STUDIES: I have personally reviewed the radiological images as listed and agreed with the findings in the report.  CT chest, abdomen and pelvis 03/15/2016 IMPRESSION: 1. Polypoid cecal mass. No findings of liver metastatic disease or other definite metastatic disease. There are some small adjacent pericecal lymph nodes which are not pathologically enlarged. 2. Adenopathy in the chest is present but was also present in 2010, albeit slightly less prominent. This may be reactive adenopathy related to the chronic exudative right pleural effusion. 3. Prominent dilatation of the esophagus, nonspecific. Causes might include achalasia, neuropathy, scleroderma,  pseudoachalasia, stricture, presbyesophagus, or Chagas disease. 4. Moderate 4 chamber cardiomegaly. 5. Liver morphology is suggestive but not absolutely diagnostic for early cirrhosis. 6.  Prominent stool throughout the colon favors constipation. 7. Left iliopsoas  bursitis. 8. Possible varicoceles in the scrotum bilaterally. 9. Impingement at L4-5 due to spondylosis and degenerative disc disease.   Colonoscopy 03/04/2016 Dr. [name]  A polypoid and ulcerated non-obstructing large mass was found in the cecum. The mass was noncircumferential. The mass measured three cm in length. In addition, its diameter measured three mm. No bleeding was present. This was biopsied with a cold forceps for histology. Findings: A 2 mm polyp was found in the transverse colon. The polyp was sessile. The polyp was removed with a cold biopsy forceps. Resection and retrieval were complete. A small ascending colon polyp was identified, image #3, near the mass. This polyp was not removed as it is anticipated that it will be removed with the mass surgically. - Likely malignant tumor in the cecum. Biopsied. - One 2 mm polyp in the transverse colon, removed with a cold biopsy forceps. Resected and retrieved.   ASSESSMENT & PLAN:  65 year old male, with past medical history of hypertension, OSA, CHF, obesity, presented with fatigue, dyspnea on exertion, and anemia. Screening colonoscopy showed a cecal mass.  1. Right colon cancer, invasive adenocarcinoma,G3,  pT2N1M0, stage IIIA, MSI-H -I reviewed his CT scan findings, and surgical pathology results in great details with patient and her family members -He had a complete surgical resection -We discussed the risk of cancer recurrence after surgery. Due to his stage IIIA disease, he is at moderate to high risk of recurrence -We discussed the standard adjuvant chemotherapy for stage III colon cancer,  reduce the risk of cancer recurrence. -we discussed the regimens for  adjuvant chemotherapy, including FOLFOX, CAPOX, or single agent Xeloda or 5-FU. Due to his comorbidities, I recommend him to consider FOLFOX -We discussed the duration of adjuvant chemotherapy. Based on the recent reported IDEA study in ASCO 2017, I recommend 3 months therapy.  --Chemotherapy consent: Side effects including but does not not limited to, fatigue, nausea, vomiting, diarrhea, hair loss, neuropathy, fluid retention, renal and kidney dysfunction, neutropenic fever, needed for blood transfusion, bleeding, coronary artery spasm and heart attack, were discussed with patient in great detail. He agrees to proceed. -He has recovered her better from surgery, ready to start adjuvant chemotherapy -I sent a message to Dr. [name] to request a port placement, and staple removal -We'll tentatively start him on chemotherapy next Thursday -I discussed his MMR and MSI test results, we'll request [facility] 1-hypermethylation and BRAFmutation test, to see if he has sporadic colon cancer versus lynch syndrome.  2. Iron deficient anemia -Secondary to his colon cancer -His anemia has resolved -Continue oral iron pill  3. HTN, OSA, CHF, obesity -He will continue follow-up with his primary care physician -We discussed that chemotherapy may affect his blood pressure, and we will monitor his blood pressure closely. We will be cautious about IV fluids, given his history of CHF.  Plan -port placement and staple removal by Dr. [name]  -Plan to tentatively start his chemotherapy in 1-2 weeks  All questions were answered. The patient knows to call the clinic with any problems, questions or concerns. I spent 25 minutes counseling the patient face to face. The total time spent in the appointment was 30 minutes and more than 50% was on counseling.     [name], MD 05/11/2016

## 2016-05-12 ENCOUNTER — Telehealth: Payer: Self-pay | Admitting: *Deleted

## 2016-05-12 NOTE — Telephone Encounter (Signed)
Family member called wanting to know if the port placement. Currently no orders found for port placement. Message sent to MD Feng/nurse

## 2016-05-12 NOTE — Telephone Encounter (Signed)
Steven Barnett, could you let your scheduler to call him about his port placement schedule? He is scheduled to start chemo on 11/16. Thanks.   [name] MD

## 2016-05-13 ENCOUNTER — Telehealth: Payer: Self-pay | Admitting: Internal Medicine

## 2016-05-13 ENCOUNTER — Encounter (HOSPITAL_BASED_OUTPATIENT_CLINIC_OR_DEPARTMENT_OTHER): Payer: Self-pay | Admitting: *Deleted

## 2016-05-13 NOTE — Telephone Encounter (Signed)
Raquel Sarna From Pre-op called [phone]) and states that pt is having port-a-cath insertion 05-18-2016 and pt needs lovenox Bridging before insertion. Please call and inform pt of directions.

## 2016-05-13 NOTE — Telephone Encounter (Signed)
Returned call to Steven Barnett and informed the following.   From a cardiovascular standpoint he does not need lovenox bridge for Afib with Chads2 of 2. See Dr. [name] note from 04/06/16. OK to hold coumadin for 5 days prior to procedure without lovenox bridge. Resume at usual dose evening of procedure unless advised otherwise. Recheck INR 2 weeks post procedure.   Called and advised pt and daughter [name] of above. Rescheduled appt for 2 weeks post procedure.

## 2016-05-13 NOTE — Progress Notes (Signed)
   05/13/16 1601  OBSTRUCTIVE SLEEP APNEA  Have you ever been diagnosed with sleep apnea through a sleep study? Yes  If yes, do you have and use a CPAP or BPAP machine every night? 0  Do you snore loudly (loud enough to be heard through closed doors)?  0  Do you often feel tired, fatigued, or sleepy during the daytime (such as falling asleep during driving or talking to someone)? 1  Has anyone observed you stop breathing during your sleep? 1  Do you have, or are you being treated for high blood pressure? 1  BMI more than 35 kg/m2? 1  Age > 50 (1-yes) 1  Neck circumference greater than:Male 16 inches or larger, Male 17inches or larger? 0  Male Gender (Yes=1) 1  Obstructive Sleep Apnea Score 6

## 2016-05-17 ENCOUNTER — Telehealth: Payer: Self-pay | Admitting: *Deleted

## 2016-05-17 ENCOUNTER — Telehealth: Payer: Self-pay | Admitting: General Practice

## 2016-05-17 NOTE — Telephone Encounter (Signed)
Called daughter and requested she let nurses know tomorrow to NOT leave his port accessed since chemo is not until 06/01/16. She understands and will do so.

## 2016-05-17 NOTE — Telephone Encounter (Signed)
Spoke with pt & daughter Steven Barnett confirmed appts for December.

## 2016-05-17 NOTE — H&P (Signed)
Steven Barnett is an 65 y.o. male.   Chief Complaint: colon cancer, stage 3 HPI:  Pt is a 65 yo M s/p partial colectomy with positive lymph nodes.  He has seen oncology and has been recommended to receive chemo.    Past Medical History:  Diagnosis Date  . Anemia   . Arthralgia of both knees   . Arthritis    "right knee" (10/01/2015)  . Atrial fibrillation ([place])   . Cancer (HCC)    STAGE 1 COLON CANCER  . Chronic anticoagulation 2010   Coumadin  . Chronic diastolic CHF (congestive heart failure), NYHA class 2 ([place])   . Hypertension   . OSA (obstructive sleep apnea) 2016   "couldn't take the mask during the testing" (10/01/2015)  . Permanent atrial fibrillation ([place]) 2010  . Pneumonia 3/29/2017and june 2017    Past Surgical History:  Procedure Laterality Date  . COLONOSCOPY WITH PROPOFOL N/A 03/04/2016   Procedure: COLONOSCOPY WITH PROPOFOL;  Surgeon: Carol Ada, MD;  [facility];  Service: Endoscopy;  Laterality: N/A;  . LAPAROSCOPIC PARTIAL COLECTOMY N/A 04/12/2016   Procedure: LAPAROSCOPIC ILEOCOLECTOMY;  Surgeon: [name], MD;  Location: [place];  Service: General;  Laterality: N/A;  . THORACOTOMY Right 2010    Family History  Problem Relation Age of Onset  . Heart disease Father   . Heart failure Father   . Colon polyps Father   . Hypertension Mother   . Diabetes Mother   . Hyperlipidemia Mother   . Colon polyps Mother   . Stroke Maternal Grandmother   . Stroke Sister   . Cancer Sister 43    colon cancer   . Colon polyps Brother   . Cancer Sister 17    colon cancer   . Heart attack Neg Hx    Social History:  reports that he quit smoking about 41 years ago. His smoking use included Cigarettes. He has a 0.20 pack-year smoking history. He has never used smokeless tobacco. He reports that he drinks alcohol. He reports that he uses drugs, including Cocaine and Marijuana.  Allergies:  Allergies  Allergen Reactions  . No Known Allergies     No  prescriptions prior to admission.    No results found for this or any previous visit (from the past 48 hour(s)). No results found.  Review of Systems  All other systems reviewed and are negative.   Height 5\' 10"  (1.778 m), weight 113.9 kg (251 lb). Physical Exam  Constitutional: He is oriented to person, place, and time. He appears well-developed and well-nourished. No distress.  HENT:  Head: Normocephalic and atraumatic.  Eyes: Conjunctivae are normal. Pupils are equal, round, and reactive to light.  Neck: Normal range of motion. Neck supple.  Cardiovascular: Normal rate.   Respiratory: Effort normal.  GI: Soft. He exhibits no distension.  Musculoskeletal: Normal range of motion.  Neurological: He is alert and oriented to person, place, and time.  Skin: Skin is warm and dry. No rash noted. He is not diaphoretic. No erythema. No pallor.  Psychiatric: He has a normal mood and affect. His behavior is normal. Judgment and thought content normal.     Assessment/Plan Colon cancer, stage III. Plan port a cath.   Reviewed risks and benefits.    [name], MD 05/17/2016, 8:16 AM

## 2016-05-18 ENCOUNTER — Encounter (HOSPITAL_BASED_OUTPATIENT_CLINIC_OR_DEPARTMENT_OTHER)
Admission: RE | Disposition: A | Discharge status: Home / Self Care | Payer: Self-pay | Source: Ambulatory Visit | Attending: General Surgery

## 2016-05-18 ENCOUNTER — Ambulatory Visit (HOSPITAL_COMMUNITY): Payer: Medicaid Other

## 2016-05-18 ENCOUNTER — Ambulatory Visit (HOSPITAL_BASED_OUTPATIENT_CLINIC_OR_DEPARTMENT_OTHER)
Admission: RE | Admit: 2016-05-18 | Discharge: 2016-05-18 | Disposition: A | Discharge status: Home / Self Care | Payer: Medicaid Other | Source: Ambulatory Visit | Attending: General Surgery | Admitting: General Surgery

## 2016-05-18 ENCOUNTER — Ambulatory Visit (HOSPITAL_BASED_OUTPATIENT_CLINIC_OR_DEPARTMENT_OTHER): Payer: Medicaid Other | Admitting: Anesthesiology

## 2016-05-18 ENCOUNTER — Encounter (HOSPITAL_BASED_OUTPATIENT_CLINIC_OR_DEPARTMENT_OTHER): Payer: Self-pay | Admitting: *Deleted

## 2016-05-18 DIAGNOSIS — I5032 Chronic diastolic (congestive) heart failure: Secondary | ICD-10-CM | POA: Insufficient documentation

## 2016-05-18 DIAGNOSIS — E669 Obesity, unspecified: Secondary | ICD-10-CM | POA: Diagnosis not present

## 2016-05-18 DIAGNOSIS — Z87891 Personal history of nicotine dependence: Secondary | ICD-10-CM | POA: Diagnosis not present

## 2016-05-18 DIAGNOSIS — Z6837 Body mass index (BMI) 37.0-37.9, adult: Secondary | ICD-10-CM | POA: Diagnosis not present

## 2016-05-18 DIAGNOSIS — Z7901 Long term (current) use of anticoagulants: Secondary | ICD-10-CM | POA: Diagnosis not present

## 2016-05-18 DIAGNOSIS — C189 Malignant neoplasm of colon, unspecified: Secondary | ICD-10-CM | POA: Insufficient documentation

## 2016-05-18 DIAGNOSIS — I110 Hypertensive heart disease with heart failure: Secondary | ICD-10-CM | POA: Insufficient documentation

## 2016-05-18 DIAGNOSIS — Z419 Encounter for procedure for purposes other than remedying health state, unspecified: Secondary | ICD-10-CM

## 2016-05-18 DIAGNOSIS — Z452 Encounter for adjustment and management of vascular access device: Secondary | ICD-10-CM | POA: Insufficient documentation

## 2016-05-18 DIAGNOSIS — I482 Chronic atrial fibrillation: Secondary | ICD-10-CM | POA: Insufficient documentation

## 2016-05-18 DIAGNOSIS — Z95828 Presence of other vascular implants and grafts: Secondary | ICD-10-CM

## 2016-05-18 HISTORY — PX: PORTACATH PLACEMENT: SHX2246

## 2016-05-18 LAB — PROTIME-INR
INR: 1.18
PROTHROMBIN TIME: 15.1 s (ref 11.4–15.2)

## 2016-05-18 IMAGING — CR DG CHEST 1V PORT
1 series · 1 of 1 positions shown · non-contrast
Comparison: Chest radiograph [DATE]; chest CT [DATE]

CLINICAL DATA: Port-A-Cath insertion

EXAM:
PORTABLE CHEST 1 VIEW

[AP]
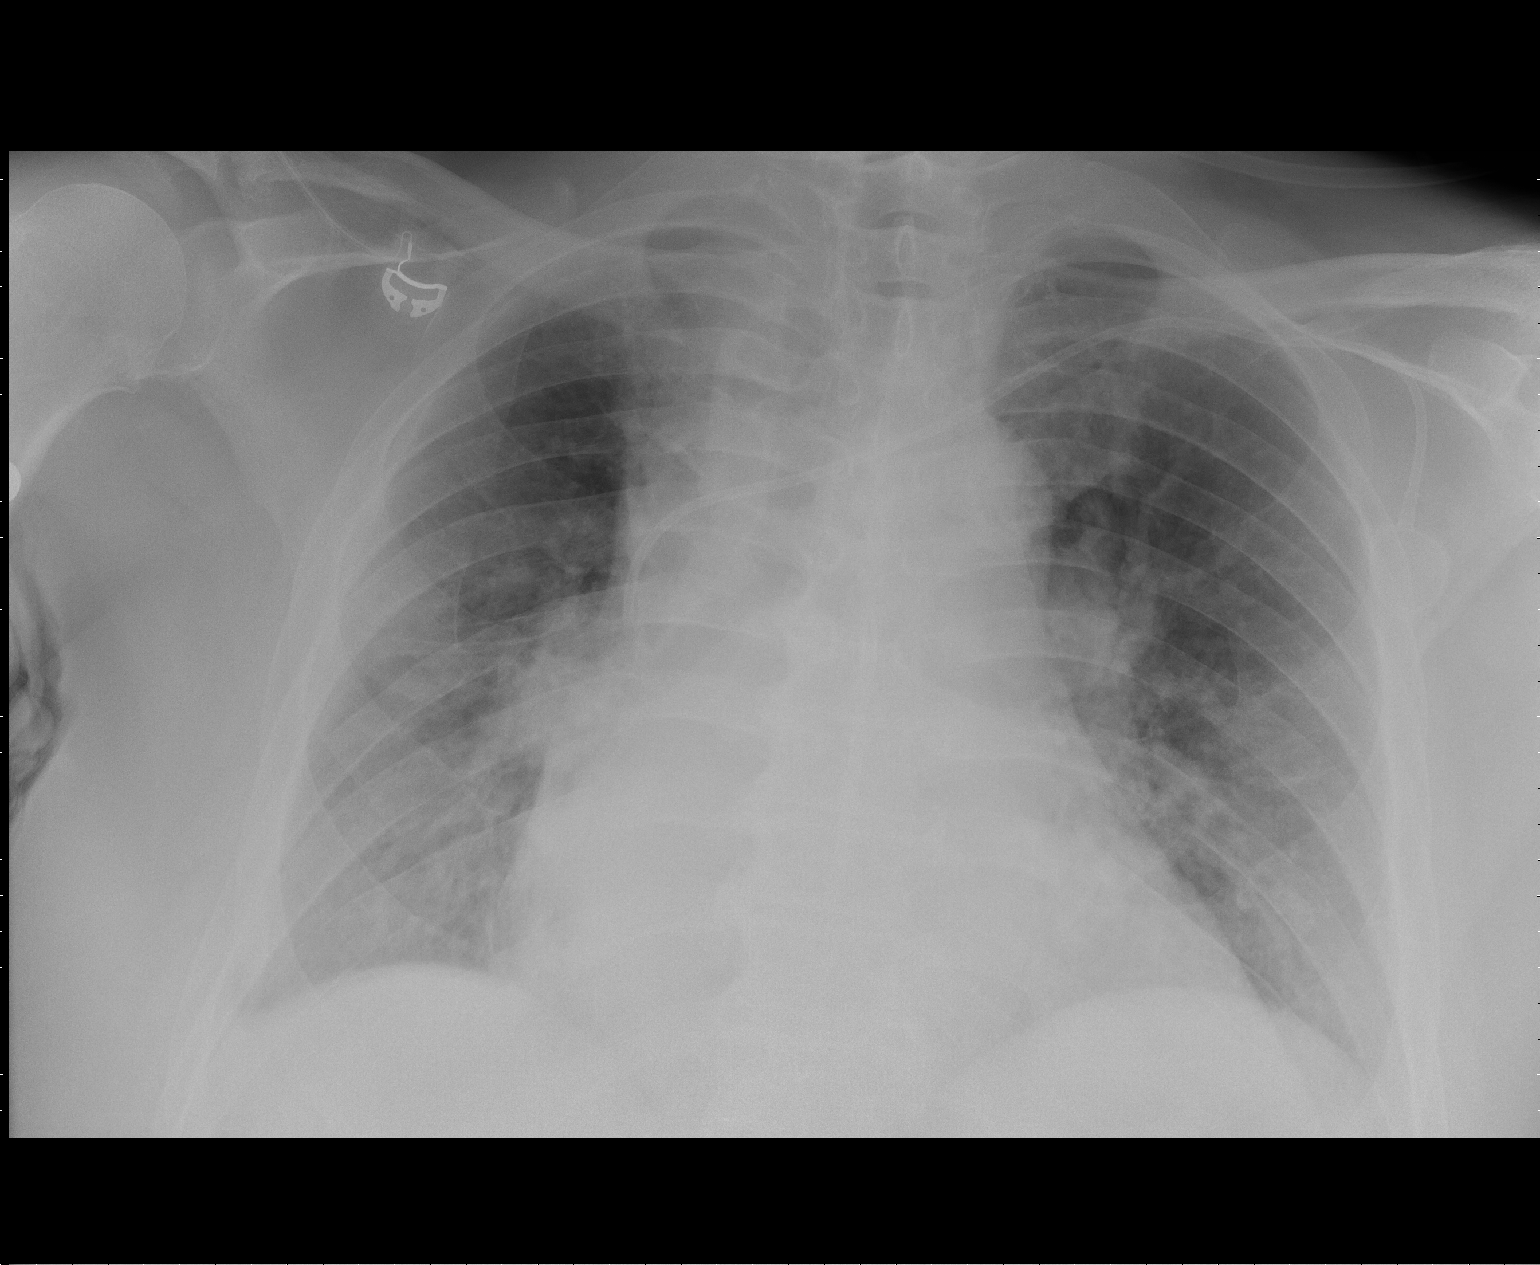

[1 of 1 positions shown; findings below may reference images not displayed]

FINDINGS: Port-A-Cath tip is in the superior vena cava. No pneumothorax. There
is mild scarring in the right base. Lungs elsewhere clear. Heart is
enlarged with pulmonary vascularity within normal limits. No
adenopathy. No bone lesions.
IMPRESSION: Port-A-Cath tip in superior vena cava. No pneumothorax. Stable
cardiac prominence. No edema or consolidation. Mild scarring right
base.

## 2016-05-18 SURGERY — INSERTION, TUNNELED CENTRAL VENOUS DEVICE, WITH PORT
Anesthesia: General | Site: Chest

## 2016-05-18 MED ORDER — LIDOCAINE-EPINEPHRINE 1 %-1:100000 IJ SOLN
INTRAMUSCULAR | Status: AC
  Filled 2016-05-18: qty 1

## 2016-05-18 MED ORDER — OXYCODONE HCL 5 MG PO TABS: 5.0000 mg | ORAL_TABLET | Freq: Once | ORAL | Status: DC | PRN

## 2016-05-18 MED ORDER — OXYCODONE HCL 5 MG PO TABS: 5.0000 mg | ORAL_TABLET | Freq: Four times a day (QID) | ORAL | 0 refills | Status: DC | PRN

## 2016-05-18 MED ORDER — CEFAZOLIN SODIUM-DEXTROSE 2-4 GM/100ML-% IV SOLN
2.0000 g | INTRAVENOUS | Status: AC
  Administered 2016-05-18: 2 g via INTRAVENOUS

## 2016-05-18 MED ORDER — LIDOCAINE 2% (20 MG/ML) 5 ML SYRINGE
INTRAMUSCULAR | Status: DC | PRN
  Administered 2016-05-18: 60 mg via INTRAVENOUS

## 2016-05-18 MED ORDER — BUPIVACAINE HCL (PF) 0.5 % IJ SOLN
INTRAMUSCULAR | Status: AC
  Filled 2016-05-18: qty 30

## 2016-05-18 MED ORDER — LIDOCAINE-EPINEPHRINE (PF) 1 %-1:200000 IJ SOLN
INTRAMUSCULAR | Status: DC | PRN
  Administered 2016-05-18: 7.5 mL

## 2016-05-18 MED ORDER — PROPOFOL 10 MG/ML IV BOLUS
INTRAVENOUS | Status: DC | PRN
  Administered 2016-05-18: 170 mg via INTRAVENOUS

## 2016-05-18 MED ORDER — BUPIVACAINE HCL (PF) 0.5 % IJ SOLN
INTRAMUSCULAR | Status: DC | PRN
  Administered 2016-05-18: 7.5 mL

## 2016-05-18 MED ORDER — SCOPOLAMINE 1 MG/3DAYS TD PT72: 1.0000 | MEDICATED_PATCH | Freq: Once | TRANSDERMAL | Status: DC | PRN

## 2016-05-18 MED ORDER — MIDAZOLAM HCL 2 MG/2ML IJ SOLN
INTRAMUSCULAR | Status: AC
  Filled 2016-05-18: qty 2

## 2016-05-18 MED ORDER — CHLORHEXIDINE GLUCONATE CLOTH 2 % EX PADS: 6.0000 | MEDICATED_PAD | Freq: Once | CUTANEOUS | Status: DC

## 2016-05-18 MED ORDER — HEPARIN SOD (PORK) LOCK FLUSH 100 UNIT/ML IV SOLN
INTRAVENOUS | Status: DC | PRN
  Administered 2016-05-18: 500 [IU] via INTRAVENOUS

## 2016-05-18 MED ORDER — HEPARIN (PORCINE) IN NACL 2-0.9 UNIT/ML-% IJ SOLN
INTRAMUSCULAR | Status: DC | PRN
  Administered 2016-05-18: 16 mL via INTRAVENOUS

## 2016-05-18 MED ORDER — MIDAZOLAM HCL 2 MG/2ML IJ SOLN
1.0000 mg | INTRAMUSCULAR | Status: DC | PRN
  Administered 2016-05-18: 1 mg via INTRAVENOUS

## 2016-05-18 MED ORDER — BUPIVACAINE HCL (PF) 0.25 % IJ SOLN
INTRAMUSCULAR | Status: AC
  Filled 2016-05-18: qty 30

## 2016-05-18 MED ORDER — FENTANYL CITRATE (PF) 100 MCG/2ML IJ SOLN
50.0000 ug | INTRAMUSCULAR | Status: DC | PRN
  Administered 2016-05-18: 50 ug via INTRAVENOUS

## 2016-05-18 MED ORDER — ONDANSETRON HCL 4 MG/2ML IJ SOLN: 4.0000 mg | Freq: Four times a day (QID) | INTRAMUSCULAR | Status: DC | PRN

## 2016-05-18 MED ORDER — CEFAZOLIN SODIUM-DEXTROSE 2-4 GM/100ML-% IV SOLN
INTRAVENOUS | Status: AC
  Filled 2016-05-18: qty 100

## 2016-05-18 MED ORDER — DEXAMETHASONE SODIUM PHOSPHATE 4 MG/ML IJ SOLN
INTRAMUSCULAR | Status: DC | PRN
  Administered 2016-05-18: 10 mg via INTRAVENOUS

## 2016-05-18 MED ORDER — HEPARIN SOD (PORK) LOCK FLUSH 100 UNIT/ML IV SOLN
INTRAVENOUS | Status: AC
  Filled 2016-05-18: qty 5

## 2016-05-18 MED ORDER — ONDANSETRON HCL 4 MG/2ML IJ SOLN
INTRAMUSCULAR | Status: DC | PRN
  Administered 2016-05-18: 4 mg via INTRAVENOUS

## 2016-05-18 MED ORDER — FENTANYL CITRATE (PF) 100 MCG/2ML IJ SOLN
INTRAMUSCULAR | Status: AC
  Filled 2016-05-18: qty 2

## 2016-05-18 MED ORDER — FENTANYL CITRATE (PF) 100 MCG/2ML IJ SOLN: 25.0000 ug | INTRAMUSCULAR | Status: DC | PRN

## 2016-05-18 MED ORDER — LACTATED RINGERS IV SOLN
INTRAVENOUS | Status: DC
  Administered 2016-05-18: 10:00:00 via INTRAVENOUS
  Administered 2016-05-18: 10 mL/h via INTRAVENOUS

## 2016-05-18 MED ORDER — CHLORHEXIDINE GLUCONATE CLOTH 2 % EX PADS
6.0000 | MEDICATED_PAD | Freq: Once | CUTANEOUS | Status: DC
Start: 2016-05-18 — End: 2016-05-18

## 2016-05-18 MED ORDER — LIDOCAINE 2% (20 MG/ML) 5 ML SYRINGE
INTRAMUSCULAR | Status: AC
  Filled 2016-05-18: qty 5

## 2016-05-18 MED ORDER — ONDANSETRON HCL 4 MG/2ML IJ SOLN
INTRAMUSCULAR | Status: AC
  Filled 2016-05-18: qty 2

## 2016-05-18 MED ORDER — OXYCODONE HCL 5 MG/5ML PO SOLN: 5.0000 mg | Freq: Once | ORAL | Status: DC | PRN

## 2016-05-18 MED ORDER — HEPARIN (PORCINE) IN NACL 2-0.9 UNIT/ML-% IJ SOLN
INTRAMUSCULAR | Status: AC
  Filled 2016-05-18: qty 500

## 2016-05-18 MED ORDER — DEXAMETHASONE SODIUM PHOSPHATE 10 MG/ML IJ SOLN
INTRAMUSCULAR | Status: AC
  Filled 2016-05-18: qty 1

## 2016-05-18 SURGICAL SUPPLY — 39 items
BAG DECANTER FOR FLEXI CONT (MISCELLANEOUS) ×3 IMPLANT
BLADE HEX COATED 2.75 (ELECTRODE) ×3 IMPLANT
BLADE SURG 11 STRL SS (BLADE) ×3 IMPLANT
BLADE SURG 15 STRL LF DISP TIS (BLADE) ×1 IMPLANT
BLADE SURG 15 STRL SS (BLADE) ×2
CHLORAPREP W/TINT 26ML (MISCELLANEOUS) ×3 IMPLANT
COVER BACK TABLE 60X90IN (DRAPES) ×3 IMPLANT
COVER MAYO STAND STRL (DRAPES) ×3 IMPLANT
DECANTER SPIKE VIAL GLASS SM (MISCELLANEOUS) IMPLANT
DERMABOND ADVANCED (GAUZE/BANDAGES/DRESSINGS) ×2
DERMABOND ADVANCED .7 DNX12 (GAUZE/BANDAGES/DRESSINGS) ×1 IMPLANT
DRAPE C-ARM 42X72 X-RAY (DRAPES) ×3 IMPLANT
DRAPE LAPAROTOMY TRNSV 102X78 (DRAPE) ×3 IMPLANT
DRAPE UTILITY XL STRL (DRAPES) ×3 IMPLANT
DRSG TEGADERM 4X4.75 (GAUZE/BANDAGES/DRESSINGS) IMPLANT
ELECT REM PT RETURN 9FT ADLT (ELECTROSURGICAL) ×3
ELECTRODE REM PT RTRN 9FT ADLT (ELECTROSURGICAL) ×1 IMPLANT
GLOVE BIO SURGEON STRL SZ 6 (GLOVE) ×3 IMPLANT
GLOVE BIOGEL PI IND STRL 6.5 (GLOVE) ×2 IMPLANT
GLOVE BIOGEL PI INDICATOR 6.5 (GLOVE) ×4
GOWN STRL REUS W/ TWL LRG LVL3 (GOWN DISPOSABLE) ×1 IMPLANT
GOWN STRL REUS W/TWL 2XL LVL3 (GOWN DISPOSABLE) ×3 IMPLANT
GOWN STRL REUS W/TWL LRG LVL3 (GOWN DISPOSABLE) ×2
KIT PORT POWER 8FR ISP CVUE (Catheter) ×3 IMPLANT
NEEDLE HYPO 25X1 1.5 SAFETY (NEEDLE) ×3 IMPLANT
PACK BASIN DAY SURGERY FS (CUSTOM PROCEDURE TRAY) ×3 IMPLANT
PENCIL BUTTON HOLSTER BLD 10FT (ELECTRODE) ×3 IMPLANT
SLEEVE SCD COMPRESS KNEE MED (MISCELLANEOUS) ×3 IMPLANT
SPONGE GAUZE 4X4 12PLY STER LF (GAUZE/BANDAGES/DRESSINGS) IMPLANT
SUT MNCRL AB 4-0 PS2 18 (SUTURE) ×3 IMPLANT
SUT PROLENE 2 0 SH DA (SUTURE) ×6 IMPLANT
SUT VIC AB 3-0 SH 27 (SUTURE) ×2
SUT VIC AB 3-0 SH 27X BRD (SUTURE) ×1 IMPLANT
SUT VICRYL 3-0 CR8 SH (SUTURE) IMPLANT
SYR 10ML LL (SYRINGE) ×3 IMPLANT
SYR 5ML LUER SLIP (SYRINGE) ×3 IMPLANT
SYR CONTROL 10ML LL (SYRINGE) ×3 IMPLANT
TOWEL OR 17X24 6PK STRL BLUE (TOWEL DISPOSABLE) ×3 IMPLANT
TOWEL OR NON WOVEN STRL DISP B (DISPOSABLE) ×3 IMPLANT

## 2016-05-18 NOTE — Discharge Instructions (Addendum)
[facility] °Office Phone Number [phone] ° ° POST OP INSTRUCTIONS ° °Always review your discharge instruction sheet given to you by the facility where your surgery was performed. ° °IF YOU HAVE DISABILITY OR FAMILY LEAVE FORMS, YOU MUST BRING THEM TO THE OFFICE FOR PROCESSING.  DO NOT GIVE THEM TO YOUR DOCTOR. ° °1. A prescription for pain medication may be given to you upon discharge.  Take your pain medication as prescribed, if needed.  If narcotic pain medicine is not needed, then you may take acetaminophen (Tylenol) or ibuprofen (Advil) as needed. °2. Take your usually prescribed medications unless otherwise directed °3. If you need a refill on your pain medication, please contact your pharmacy.  They will contact our office to request authorization.  Prescriptions will not be filled after 5pm or on week-ends. °4. You should eat very light the first 24 hours after surgery, such as soup, crackers, pudding, etc.  Resume your normal diet the day after surgery °5. It is common to experience some constipation if taking pain medication after surgery.  Increasing fluid intake and taking a stool softener will usually help or prevent this problem from occurring.  A mild laxative (Milk of Magnesia or Miralax) should be taken according to package directions if there are no bowel movements after 48 hours. °6. You may shower in 48 hours.  The surgical glue will flake off in 2-3 weeks.   °7. ACTIVITIES:  No strenuous activity or heavy lifting for 1 week.   °a. You may drive when you no longer are taking prescription pain medication, you can comfortably wear a seatbelt, and you can safely maneuver your car and apply brakes. °b. RETURN TO WORK:  __________to be determined._______________ °You should see your doctor in the office for a follow-up appointment approximately three-four weeks after your surgery.   ° °WHEN TO CALL YOUR DOCTOR: °1. Fever over 101.0 °2. Nausea and/or vomiting. °3. Extreme swelling  or bruising. °4. Continued bleeding from incision. °5. Increased pain, redness, or drainage from the incision. ° °The clinic staff is available to answer your questions during regular business hours.  Please don’t hesitate to call and ask to speak to one of the nurses for clinical concerns.  If you have a medical emergency, go to the nearest emergency room or call 911.  A surgeon from [facility] is always on call at the hospital. ° °For further questions, please visit [email or web address]  ° ° ° ° °Post Anesthesia Home Care Instructions ° °Activity: °Get plenty of rest for the remainder of the day. A responsible adult should stay with you for 24 hours following the procedure.  °For the next 24 hours, DO NOT: °-Drive a car °-Operate machinery °-Drink alcoholic beverages °-Take any medication unless instructed by your physician °-Make any legal decisions or sign important papers. ° °Meals: °Start with liquid foods such as gelatin or soup. Progress to regular foods as tolerated. Avoid greasy, spicy, heavy foods. If nausea and/or vomiting occur, drink only clear liquids until the nausea and/or vomiting subsides. Call your physician if vomiting continues. ° °Special Instructions/Symptoms: °Your throat may feel dry or sore from the anesthesia or the breathing tube placed in your throat during surgery. If this causes discomfort, gargle with warm salt water. The discomfort should disappear within 24 hours. ° °If you had a scopolamine patch placed behind your ear for the management of post- operative nausea and/or vomiting: ° °1. The medication in the patch is effective for 72 hours, after   which it should be removed.  Wrap patch in a tissue and discard in the trash. Wash hands thoroughly with soap and water. °2. You may remove the patch earlier than 72 hours if you experience unpleasant side effects which may include dry mouth, dizziness or visual disturbances. °3. Avoid touching the patch. Wash your  hands with soap and water after contact with the patch. °  ° ° °

## 2016-05-18 NOTE — Anesthesia Preprocedure Evaluation (Signed)
Anesthesia Evaluation  Patient identified by MRN, date of birth, ID band Patient awake    Reviewed: Allergy & Precautions, H&P , NPO status , Patient's Chart, lab work & pertinent test results  Airway Mallampati: II   Neck ROM: full    Dental   Pulmonary sleep apnea , former smoker,    breath sounds clear to auscultation       Cardiovascular hypertension, +CHF  + dysrhythmias Atrial Fibrillation  Rhythm:irregular Rate:Normal     Neuro/Psych    GI/Hepatic Colon CA   Endo/Other  obese  Renal/GU      Musculoskeletal  (+) Arthritis ,   Abdominal   Peds  Hematology   Anesthesia Other Findings   Reproductive/Obstetrics                             Anesthesia Physical Anesthesia Plan  ASA: III  Anesthesia Plan: General   Post-op Pain Management:    Induction: Intravenous  Airway Management Planned: LMA  Additional Equipment:   Intra-op Plan:   Post-operative Plan:   Informed Consent: I have reviewed the patients History and Physical, chart, labs and discussed the procedure including the risks, benefits and alternatives for the proposed anesthesia with the patient or authorized representative who has indicated his/her understanding and acceptance.     Plan Discussed with: CRNA, Anesthesiologist and Surgeon  Anesthesia Plan Comments:         Anesthesia Quick Evaluation

## 2016-05-18 NOTE — Transfer of Care (Signed)
Immediate Anesthesia Transfer of Care Note  Patient: Steven Barnett  Procedure(s) Performed: Procedure(s): INSERTION PORT-A-CATH (N/A)  Patient Location: PACU  Anesthesia Type:General  Level of Consciousness: sedated  Airway & Oxygen Therapy: Patient Spontanous Breathing and Patient connected to face mask oxygen  Post-op Assessment: Report given to RN and Post -op Vital signs reviewed and stable  Post vital signs: Reviewed and stable  Last Vitals:  Vitals:   05/18/16 1115 05/18/16 1116  BP:  (!) 85/53  Pulse: 78 82  Resp:  14  Temp:      Last Pain:  Vitals:   05/18/16 0941  TempSrc: Oral         Complications: No apparent anesthesia complications

## 2016-05-18 NOTE — Anesthesia Postprocedure Evaluation (Signed)
Anesthesia Post Note  Patient: Steven Barnett  Procedure(s) Performed: Procedure(s) (LRB): INSERTION PORT-A-CATH (N/A)  Patient location during evaluation: PACU Anesthesia Type: General Level of consciousness: awake and alert and patient cooperative Pain management: pain level controlled Vital Signs Assessment: post-procedure vital signs reviewed and stable Respiratory status: spontaneous breathing and respiratory function stable Cardiovascular status: stable Anesthetic complications: no    Last Vitals:  Vitals:   05/18/16 1145 05/18/16 1200  BP: 127/81 (!) 132/96  Pulse: 73 70  Resp: 12 10  Temp:      Last Pain:  Vitals:   05/18/16 1145  TempSrc:   PainSc: [place]

## 2016-05-18 NOTE — Op Note (Signed)
PREOPERATIVE DIAGNOSIS:  Stage III colon cancer     POSTOPERATIVE DIAGNOSIS:  Same     PROCEDURE: Left subclavian port placement, Bard   Power Port, MRI safe, 8-French.      SURGEON:  [name], MD      ANESTHESIA:  General   FINDINGS:  Good venous return, easy flush, and tip of the catheter and   SVC 27.5 cm.      SPECIMEN:  None.      ESTIMATED BLOOD LOSS:  Minimal.      COMPLICATIONS:  None known.      PROCEDURE:  Pt was identified in the holding area and taken to   the operating room, where patient was placed supine on the operating room   table.  General anesthesia was induced.  Patient's arms were tucked and the upper   chest and neck were prepped and draped in sterile fashion.  Time-out was   performed according to the surgical safety check list.  When all was   correct, we continued.   Local anesthetic was administered over this   area at the angle of the clavicle.  The vein was accessed with 1 pass of the needle. There was good venous return and the wire passed easily with no ectopy.   Fluoroscopy was used to confirm that the wire was in the vena cava.      The patient was placed back level and the area for the pocket was anethetized   with local anesthetic.  A 3-cm transverse incision was made with a #15   blade.  Cautery was used to divide the subcutaneous tissues down to the   pectoralis muscle.  An [employer] retractor was used to elevate the skin   while a pocket was created on top of the pectoralis fascia.  The port   was placed into the pocket to confirm that it was of adequate size.  The   catheter was preattached to the port.  The port was then secured to the   pectoralis fascia with four 2-0 Prolene sutures.  These were clamped and   not tied down yet.    The catheter was tunneled through to the wire exit   site.  The catheter was placed along the wire to determine what length it should be to be in the SVC.  The catheter was cut at 27.5 cm.  The tunneler  sheath and dilator were passed over the wire and the dilator and wire were removed.  The catheter was advanced through the tunneler sheath and the tunneler sheath was pulled away.  Care was taken to keep the catheter in the tunneler sheath as this occurred. This was advanced and the tunneler sheath was removed.  There was good venous   return and easy flush of the catheter.  The Prolene sutures were tied   down to the pectoral fascia.  The skin was reapproximated using 3-0   Vicryl interrupted deep dermal sutures.    Fluoroscopy was used to re-confirm good position of the catheter.  The skin   was then closed using 4-0 Monocryl in a subcuticular fashion.  The port was flushed with concentrated heparin flush as well.  The wounds were then cleaned, dried, and dressed with Dermabond.  The patient was awakened from anesthesia and taken to the PACU in stable condition.  Needle, sponge, and instrument counts were correct.               [name], MD

## 2016-05-18 NOTE — Anesthesia Procedure Notes (Signed)
Procedure Name: LMA Insertion Date/Time: 05/18/2016 10:34 AM Performed by: Lyndee Leo Pre-anesthesia Checklist: Patient identified, Emergency Drugs available, Suction available and Patient being monitored Patient Re-evaluated:Patient Re-evaluated prior to inductionOxygen Delivery Method: Circle system utilized Preoxygenation: Pre-oxygenation with 100% oxygen Intubation Type: IV induction Ventilation: Mask ventilation without difficulty LMA: LMA with gastric port inserted LMA Size: 5.0 Number of attempts: 1 Airway Equipment and Method: Bite block Placement Confirmation: positive ETCO2 and breath sounds checked- equal and bilateral Tube secured with: Tape Dental Injury: Teeth and Oropharynx as per pre-operative assessment

## 2016-05-18 NOTE — Interval H&P Note (Signed)
History and Physical Interval Note:  05/18/2016 10:01 AM  Steven Barnett  has presented today for surgery, with the diagnosis of COLON CANCER  The various methods of treatment have been discussed with the patient and family. After consideration of risks, benefits and other options for treatment, the patient has consented to  Procedure(s): INSERTION PORT-A-CATH (N/A) as a surgical intervention .  The patient's history has been reviewed, patient examined, no change in status, stable for surgery.  I have reviewed the patient's chart and labs.  Questions were answered to the patient's satisfaction.     [name]

## 2016-05-19 ENCOUNTER — Telehealth: Payer: Self-pay | Admitting: Hematology

## 2016-05-19 ENCOUNTER — Encounter (HOSPITAL_BASED_OUTPATIENT_CLINIC_OR_DEPARTMENT_OTHER): Payer: Self-pay | Admitting: General Surgery

## 2016-05-19 NOTE — Telephone Encounter (Signed)
sw pt dtr to confirm 11/29 and 11/30 appt date/times per LOS

## 2016-05-23 ENCOUNTER — Telehealth: Payer: Self-pay | Admitting: *Deleted

## 2016-05-23 NOTE — Telephone Encounter (Signed)
Patient's wife called and stated,"his urine was light pink last night but today it's clear and yellow. Instructed wife to make sure patient is drinking at least 64 ounces of water a day, and to monitor his urine output for any color change. Also, please call the office if there are any changes to his urine color. Wife verbalized understanding.

## 2016-05-25 ENCOUNTER — Other Ambulatory Visit: Payer: Self-pay | Admitting: Hematology

## 2016-05-28 ENCOUNTER — Other Ambulatory Visit: Payer: Self-pay | Admitting: Internal Medicine

## 2016-05-31 ENCOUNTER — Ambulatory Visit (INDEPENDENT_AMBULATORY_CARE_PROVIDER_SITE_OTHER): Payer: Medicaid Other | Admitting: Pharmacist Clinician (PhC)/ Clinical Pharmacy Specialist

## 2016-05-31 DIAGNOSIS — Z7901 Long term (current) use of anticoagulants: Secondary | ICD-10-CM

## 2016-05-31 DIAGNOSIS — I482 Chronic atrial fibrillation: Secondary | ICD-10-CM

## 2016-05-31 DIAGNOSIS — I4821 Permanent atrial fibrillation: Secondary | ICD-10-CM

## 2016-05-31 LAB — POCT INR: INR: 1.4

## 2016-06-01 ENCOUNTER — Ambulatory Visit: Payer: Medicaid Other

## 2016-06-01 ENCOUNTER — Other Ambulatory Visit: Payer: Self-pay | Admitting: Internal Medicine

## 2016-06-01 ENCOUNTER — Ambulatory Visit (HOSPITAL_BASED_OUTPATIENT_CLINIC_OR_DEPARTMENT_OTHER): Payer: Medicaid Other | Admitting: Hematology

## 2016-06-01 ENCOUNTER — Other Ambulatory Visit: Payer: Medicaid Other

## 2016-06-01 ENCOUNTER — Encounter: Payer: Self-pay | Admitting: Hematology

## 2016-06-01 ENCOUNTER — Encounter: Payer: Self-pay | Admitting: Pharmacist

## 2016-06-01 ENCOUNTER — Telehealth: Payer: Self-pay | Admitting: Hematology

## 2016-06-01 VITALS — BP 125/65 | HR 66 | Temp 97.6°F | Resp 17 | Ht 70.0 in | Wt 262.7 lb

## 2016-06-01 DIAGNOSIS — D509 Iron deficiency anemia, unspecified: Secondary | ICD-10-CM | POA: Diagnosis not present

## 2016-06-01 DIAGNOSIS — I509 Heart failure, unspecified: Secondary | ICD-10-CM | POA: Diagnosis not present

## 2016-06-01 DIAGNOSIS — I10 Essential (primary) hypertension: Secondary | ICD-10-CM

## 2016-06-01 DIAGNOSIS — C182 Malignant neoplasm of ascending colon: Secondary | ICD-10-CM

## 2016-06-01 DIAGNOSIS — E669 Obesity, unspecified: Secondary | ICD-10-CM

## 2016-06-01 NOTE — Progress Notes (Signed)
[place]  [phone] [phone]  Clinic Follow up Note   Patient Care Team: Elisabeth Cara, PA-C as PCP - General (Family Medicine) [name], MD as Consulting Physician (General Surgery) Tania Ade, RN as Registered Nurse 06/01/2016   CHIEF COMPLAINTS:  Stage III colon cancer  Oncology History   Cancer of right colon [facility])   Staging form: Colon and Rectum, AJCC 7th Edition   - Clinical stage from 04/12/2016: Stage IIIA (T2, N1, M0) - Signed by [name], MD on 05/01/2016      Cancer of right colon ([place])   03/04/2016 Procedure    COLONOSCOPY: A polypoid and ulcerated non-obstructing large mass was found in the cecum. The mass was noncircumferential. The mass measured three cm in length. In addition, its diameter measured three mm. (Dr. [name])       03/15/2016 Imaging    1. Polypoid cecal mass. No findings of liver metastatic disease or other definite metastatic disease. There are some small adjacent pericecal lymph nodes which are not pathologically enlarged. 2. Adenopathy in the chest is present but was also present in 2010, albeit slightly less prominent. This may be reactive adenopathy related to the chronic exudative right pleural effusion.       04/07/2016 Tumor Marker    Patient's tumor was tested for the following markers: CEA. Results of the tumor marker test revealed 2.7.      04/12/2016 Initial Diagnosis    Cecal cancer ([place])     04/12/2016 Definitive Surgery    Laparoscopic right hemicolectomy (Dr. [name])      04/12/2016 Pathologic Stage    pT2 pN1 pMX--Grade 3 adenocarcinoma with 2/16 nodes positive, clear margins, negative for perineural or lymphvascular invasion; invades muscularis propria Loss of expression MLH1/PMS2       HISTORY OF PRESENTING ILLNESS (04/27/2016):  Steven Barnett 65 y.o. male is here because of his recently diagnosed stage III colon cancer. She is accompanied by her wife to my  clinic today.  This was discovered by screening colonoscopy in 03/2016. At the time of his screening colonoscopy, he was found to have anemia and iron deficiency. He has had mild to moderate fatigue and dyspnea on exertion for several months. Colonoscopy showed a large no obstructive mass in the cecum, biopsy showed adenocarcinoma. He was referred to see surgeon Dr. [name] and underwent right hemicolectomy on 04/12/2016. He has been recovering slowly from surgery, still has mild to moderate pain at the incision, his energy level and appetite has not recovered well.   He was hospitalized for CHF in 09/2015 and was found to have pleural effusion, he is on lasix. He is able to do all his ADLs, and light activities, such as house work. He is not very physically active.    CURRENT THERAPY: Ajuvant chemotherapy FOLFOX, every 2 weeks, starting 06/02/2016  INTERIM HISTORY:  Steven Barnett returns for follow-up. He is scheduled to start chemotherapy tomorrow. He has had a port placed last week by Dr. [name]. He is doing well overall, denies any significant pain, abdominal discomfort, or other symptoms. Bowel movement is normal, his appetite and energy level are decent. He has gained about 10 pounds in the past 3 weeks.   MEDICAL HISTORY:  Past Medical History:  Diagnosis Date  . Anemia   . Arthralgia of both knees   . Arthritis    "right knee" (10/01/2015)  . Atrial fibrillation ([place])   . Cancer ([place])    STAGE 1  COLON CANCER  . Chronic anticoagulation 2010   Coumadin  . Chronic diastolic CHF (congestive heart failure), NYHA class 2 ([place])   . Hypertension   . OSA (obstructive sleep apnea) 2016   "couldn't take the mask during the testing" (10/01/2015)  . Permanent atrial fibrillation ([place]) 2010  . Pneumonia 3/29/2017and june 2017    SURGICAL HISTORY: Past Surgical History:  Procedure Laterality Date  . COLONOSCOPY WITH PROPOFOL N/A 03/04/2016   Procedure: COLONOSCOPY WITH PROPOFOL;  Surgeon: Carol Ada, MD;  Location: [facility];  Service: Endoscopy;  Laterality: N/A;  . LAPAROSCOPIC PARTIAL COLECTOMY N/A 04/12/2016   Procedure: LAPAROSCOPIC ILEOCOLECTOMY;  Surgeon: [name], MD;  Location: [place];  Service: General;  Laterality: N/A;  . PORTACATH PLACEMENT N/A 05/18/2016   Procedure: INSERTION PORT-A-CATH;  Surgeon: [name], MD;  Location: [place];  Service: General;  Laterality: N/A;  . THORACOTOMY Right 2010    SOCIAL HISTORY: Social History   Social History  . Marital status: Married    Spouse name: Ronnette Juniper  . Number of children: N/A  . Years of education: N/A   Occupational History  . Not on file.   Social History Main Topics  . Smoking status: Former Smoker    Packs/day: 0.10    Years: 2.00    Types: Cigarettes    Quit date: 12/16/1974  . Smokeless tobacco: Never Used     Comment: "1 pack cigarettes would last me a month"  . Alcohol use 0.0 oz/week     Comment: 10/01/2015 "might have1 6 pack of beer/year"  . Drug use:     Types: Cocaine, Marijuana     Comment: Hx polysubstance abuse, quit 2008  . Sexual activity: Not Currently   Other Topics Concern  . 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   FAMILY HISTORY: Family History  Problem Relation Age of Onset  . Heart disease Father   . Heart failure Father   . Colon polyps Father   . Hypertension Mother   . Diabetes Mother   . Hyperlipidemia Mother   . Colon polyps Mother   . Stroke Maternal Grandmother   . Stroke Sister   . Cancer Sister 77    colon cancer   . Colon polyps Brother   . Cancer Sister 17    colon cancer   . Heart attack Neg Hx     ALLERGIES:  is allergic to no known allergies.  MEDICATIONS:  Current Outpatient Prescriptions  Medication Sig Dispense Refill  . acetaminophen (TYLENOL) 500 MG tablet Take 1,000 mg by mouth 2 (two) times daily.    Marland Kitchen albuterol (PROVENTIL  HFA;VENTOLIN HFA) 108 (90 Base) MCG/ACT inhaler Inhale 2 puffs into the lungs every 6 (six) hours as needed for wheezing or shortness of breath. 1 Inhaler 2  . aspirin EC 81 MG tablet Take 81 mg by mouth daily.    Marland Kitchen diltiazem (CARDIZEM) 120 MG tablet Take 120 mg by mouth daily.    Marland Kitchen docusate sodium (COLACE) 100 MG capsule Take 1 capsule (100 mg total) by mouth 2 (two) times daily. For constipation 60 capsule 3  . ferrous sulfate 325 (65 FE) MG tablet Take 1 tablet (325 mg total) by mouth 2 (two) times daily with a meal. 60 tablet 3  . furosemide (LASIX) 80 MG tablet Take 1 tablet (80 mg total) by mouth 2 (two) times  daily. 60 tablet 1  . guaiFENesin (MUCINEX) 600 MG 12 hr tablet Take 1,200 mg by mouth 2 (two) times daily.    Marland Kitchen guaiFENesin-dextromethorphan (ROBITUSSIN DM) 100-10 MG/5ML syrup Take 5 mLs by mouth every 4 (four) hours as needed for cough. 118 mL 0  . hydrALAZINE (APRESOLINE) 50 MG tablet TAKE ONE TABLET BY MOUTH TWICE DAILY 60 tablet 6  . HYDROcodone-acetaminophen (NORCO/VICODIN) 5-325 MG tablet Take 1-2 tablets by mouth every 6 (six) hours as needed for moderate pain. 30 tablet 0  . ipratropium (ATROVENT HFA) 17 MCG/ACT inhaler Inhale 2 puffs into the lungs as needed for wheezing.    . lidocaine-prilocaine (EMLA) cream Apply to affected area once 30 g 3  . lisinopril (PRINIVIL,ZESTRIL) 20 MG tablet TAKE ONE TABLET BY MOUTH ONCE DAILY 30 tablet 10  . metoprolol tartrate (LOPRESSOR) 25 MG tablet Take 1 tablet (25 mg total) by mouth 2 (two) times daily. 30 tablet 5  . oxyCODONE (OXY IR/ROXICODONE) 5 MG immediate release tablet Take 1-2 tablets (5-10 mg total) by mouth every 6 (six) hours as needed for moderate pain, severe pain or breakthrough pain. 10 tablet 0  . potassium chloride SA (K-DUR,KLOR-CON) 20 MEQ tablet Take 2 tablets (40 mEq total) by mouth daily. 180 tablet 3  . sildenafil (REVATIO) 20 MG tablet TAKE TWO TO THREE TABLETS BY MOUTH ONCE DAILY AS NEEDED FOR  SEXUAL  ACTIVITY 30  tablet 2  . warfarin (COUMADIN) 4 MG tablet TAKE TWO TABLETS BY MOUTH ONCE DAILY OR  AS  DIRECTED  BY  THE  COUMADIN  CLINIC 60 tablet 0  . LORazepam (ATIVAN) 0.5 MG tablet Take 1 tablet (0.5 mg total) by mouth every 6 (six) hours as needed (Nausea or vomiting). (Patient not taking: Reported on 06/01/2016) 30 tablet 0  . ondansetron (ZOFRAN) 8 MG tablet Take 1 tablet (8 mg total) by mouth 2 (two) times daily as needed for refractory nausea / vomiting. Start on day 3 after chemotherapy. (Patient not taking: Reported on 06/01/2016) 30 tablet 1  . prochlorperazine (COMPAZINE) 10 MG tablet Take 1 tablet (10 mg total) by mouth every 6 (six) hours as needed (Nausea or vomiting). (Patient not taking: Reported on 06/01/2016) 30 tablet 1   No current facility-administered medications for this visit.     REVIEW OF SYSTEMS:   Constitutional: Denies fevers, chills or abnormal night sweats, (+) fatigue  Eyes: Denies blurriness of vision, double vision or watery eyes Ears, nose, mouth, throat, and face: Denies mucositis or sore throat Respiratory: Denies cough, dyspnea or wheezes Cardiovascular: Denies palpitation, chest discomfort or lower extremity swelling, (+) dyspnea on exertion Gastrointestinal:  Denies nausea, heartburn or change in bowel habits Skin: Denies abnormal skin rashes Lymphatics: Denies new lymphadenopathy or easy bruising Neurological:Denies numbness, tingling or new weaknesses Behavioral/Psych: Mood is stable, no new changes  All other systems were reviewed with the patient and are negative.  PHYSICAL EXAMINATION: ECOG PERFORMANCE STATUS: 1  Vitals:   06/01/16 1011  BP: 125/65  Pulse: 66  Resp: 17  Temp: 97.6 F (36.4 C)   Filed Weights   06/01/16 1011  Weight: 262 lb 11.2 oz (119.2 kg)    GENERAL:alert, no distress and comfortable SKIN: skin color, texture, turgor are normal, no rashes or significant lesions EYES: normal, conjunctiva are pink and non-injected, sclera  clear OROPHARYNX:no exudate, no erythema and lips, buccal mucosa, and tongue normal  NECK: supple, thyroid normal size, non-tender, without nodularity LYMPH:  no palpable lymphadenopathy in the cervical,  axillary or inguinal LUNGS: clear to auscultation and percussion with normal breathing effort HEART: regular rate & rhythm and no murmurs and no lower extremity edema ABDOMEN:abdomen soft, non-tender and normal bowel sounds. (+) midline incision appears healing well, no discharge, (+) staples  Musculoskeletal:no cyanosis of digits and no clubbing  PSYCH: alert & oriented x 3 with fluent speech NEURO: no focal motor/sensory deficits     LABORATORY DATA:  I have reviewed the data as listed CBC Latest Ref Rng & Units 05/11/2016 04/27/2016 04/16/2016  WBC 4.0 - 10.3 10e3/uL 5.2 6.5 6.3  Hemoglobin 13.0 - 17.1 g/dL 13.0 11.5(L) 9.3(L)  Hematocrit 38.4 - 49.9 % 42.1 36.6(L) 29.6(L)  Platelets 140 - 400 10e3/uL 284 307 151   CMP Latest Ref Rng & Units 05/11/2016 04/27/2016 04/16/2016  Glucose 70 - 140 mg/dl 86 81 98  BUN 7.0 - 26.0 mg/dL 14.0 11.7 6  Creatinine 0.7 - 1.3 mg/dL 1.0 0.9 0.82  Sodium 136 - 145 mEq/L 138 138 136  Potassium 3.5 - 5.1 mEq/L 4.5 4.6 3.7  Chloride 101 - 111 mmol/L - - 99(L)  CO2 22 - 29 mEq/L 30(H) 28 31  Calcium 8.4 - 10.4 mg/dL 9.4 9.3 8.6(L)  Total Protein 6.4 - 8.3 g/dL 8.0 7.9 -  Total Bilirubin 0.20 - 1.20 mg/dL 0.74 1.24(H) -  Alkaline Phos 40 - 150 U/L 115 135 -  AST 5 - 34 U/L 21 16 -  ALT 0 - 55 U/L 16 11 -   CEA: 04/07/16: 2.7 04/27/2016: 2.11   PATHOLOGY  Diagnosis 04/12/2016 Colon, segmental resection for tumor, Right and Terminal Ileum HIGH GRADE ADENOCARCINOMA OF THE CECUM (3 CM), GRADE 3 THE CARCINOMA INVADES MUSCULARIS PROPRIA (PT2) ALL MARGINS OF RESECTION ARE NEGATIVE FOR TUMOR METASTATIC ADENOCARCINOMA IN TWO OF SIXTEEN LYMPH NODES (2/16) UNREMARKABLE APPENDIX Microscopic Comment COLON AND RECTUM (INCLUDING TRANS-ANAL  RESECTION): Specimen: Right colon and terminal ileum Procedure: Segmental resection Tumor site: Cecum Specimen integrity: Intact Macroscopic intactness of mesorectum: Not applicable: x Complete: NA Near complete: NA Incomplete: NA Cannot be determined (specify): NA Macroscopic tumor perforation: Invasion of the muscularis propria Invasive tumor: Maximum size: 3 cm Histologic type(s): Adenocarcinoma Histologic grade and differentiation: G3 G1: well differentiated/low grade G2: moderately differentiated/low grade G3: poorly differentiated/high grade G4: undifferentiated/high grade Type of polyp in which invasive carcinoma arose: Tubular adenoma 1 of 5 Supplemental copy SUPPLEMENTAL for BLISS, TSANG ([id number]) Microscopic Comment(continued) Microscopic extension of invasive tumor: Muscularis propria Lymph-Vascular invasion: Negative Peri-neural invasion: Negative Tumor deposit(s) (discontinuous extramural extension): Negative Resection margins: Proximal margin: Negative Distal margin: Negative Circumferential (radial) (posterior ascending, posterior descending; lateral and posterior mid-rectum; and entire lower 1/3 rectum):Negative Mesenteric margin (sigmoid and transverse): NA Distance closest margin (if all above margins negative): 3 cm Trans-anal resection margins only: Deep margin: NA Mucosal Margin: NA Distance closest mucosal margin (if negative): NA Treatment effect (neo-adjuvant therapy): Negative Additional polyp(s): Negative Non-neoplastic findings: Unremarkable Lymph nodes: number examined 16; number positive: 2 Pathologic Staging: pT2, pN1, Mx  MMR: (+) loss of MLH1 and PMS2  MSI-H, MLH-1 hypermethylation presents, BRAF mutation (-)  RADIOGRAPHIC STUDIES: I have personally reviewed the radiological images as listed and agreed with the findings in the report.  CT chest, abdomen and pelvis 03/15/2016 IMPRESSION: 1. Polypoid cecal mass. No findings of  liver metastatic disease or other definite metastatic disease. There are some small adjacent pericecal lymph nodes which are not pathologically enlarged. 2. Adenopathy in the chest is present but was also present in  2010, albeit slightly less prominent. This may be reactive adenopathy related to the chronic exudative right pleural effusion. 3. Prominent dilatation of the esophagus, nonspecific. Causes might include achalasia, neuropathy, scleroderma, pseudoachalasia, stricture, presbyesophagus, or Chagas disease. 4. Moderate 4 chamber cardiomegaly. 5. Liver morphology is suggestive but not absolutely diagnostic for early cirrhosis. 6.  Prominent stool throughout the colon favors constipation. 7. Left iliopsoas bursitis. 8. Possible varicoceles in the scrotum bilaterally. 9. Impingement at L4-5 due to spondylosis and degenerative disc disease.   Colonoscopy 03/04/2016 Dr. [name]  A polypoid and ulcerated non-obstructing large mass was found in the cecum. The mass was noncircumferential. The mass measured three cm in length. In addition, its diameter measured three mm. No bleeding was present. This was biopsied with a cold forceps for histology. Findings: A 2 mm polyp was found in the transverse colon. The polyp was sessile. The polyp was removed with a cold biopsy forceps. Resection and retrieval were complete. A small ascending colon polyp was identified, image #3, near the mass. This polyp was not removed as it is anticipated that it will be removed with the mass surgically. - Likely malignant tumor in the cecum. Biopsied. - One 2 mm polyp in the transverse colon, removed with a cold biopsy forceps. Resected and retrieved.   ASSESSMENT & PLAN:  65 year old male, with past medical history of hypertension, OSA, CHF, obesity, presented with fatigue, dyspnea on exertion, and anemia. Screening colonoscopy showed a cecal mass.  1. Right colon cancer, invasive adenocarcinoma,G3,  pT2N1M0,  stage IIIA, MSI-H -I reviewed his CT scan findings, and surgical pathology results in great details with patient and her family members -He had a complete surgical resection -His tumor has MSI high, but ML H1 hypermethylation present, supports sporadic colon cancer, 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 -We discussed the risk of cancer recurrence after surgery. Due to his stage IIIA disease, he is at moderate to high risk of recurrence -We discussed the standard adjuvant chemotherapy for stage III colon cancer,  reduce the risk of cancer recurrence. -we discussed the regimens for adjuvant chemotherapy, including FOLFOX, CAPOX, or single agent Xeloda or 5-FU. Due to his comorbidities, I recommend him to consider FOLFOX -He has attended chemotherapy class, I reviewed potential side effects with him again, he voiced good understanding. -We discussed the duration of adjuvant chemotherapy. Based on the recent reported IDEA study in ASCO 2017, I recommend 3 months therapy.  --He has recovered her better from surgery, ready to start adjuvant chemotherapy, which is scheduled for tomorrow. -We reviewed how to use antiemetic meds at home   2. Iron deficient anemia -Secondary to his colon cancer -His anemia has resolved -Continue oral iron pill  3. HTN, OSA, CHF, obesity -He will continue follow-up with his primary care physician -We discussed that chemotherapy may affect his blood pressure, and we will monitor his blood pressure closely. We will be cautious about IV fluids, given his history of CHF. -He is on lasix, which may need to be held after chemo if his oral intake decreases   Plan -first cycle FOLFOX tomorrow, I have called in Emla cream, Compazine and Zofran -lab, flush, f/u and chemo in 2 and 4 weeks   All questions were answered. The patient knows to call the clinic with any problems, questions or concerns. I spent 20 minutes counseling the patient face to face. The total time spent in the appointment  was 25 minutes and more than 50% was on counseling.     [name], MD  06/01/2016

## 2016-06-01 NOTE — Telephone Encounter (Signed)
GAVE PATIENT AVS REPORT AND APPOINTMENTS FOR December  °

## 2016-06-02 ENCOUNTER — Encounter: Payer: Self-pay | Admitting: *Deleted

## 2016-06-02 ENCOUNTER — Other Ambulatory Visit (HOSPITAL_BASED_OUTPATIENT_CLINIC_OR_DEPARTMENT_OTHER): Payer: Medicaid Other

## 2016-06-02 ENCOUNTER — Ambulatory Visit (HOSPITAL_BASED_OUTPATIENT_CLINIC_OR_DEPARTMENT_OTHER): Payer: Medicaid Other

## 2016-06-02 VITALS — BP 103/56 | HR 43 | Temp 97.9°F | Resp 16

## 2016-06-02 DIAGNOSIS — C180 Malignant neoplasm of cecum: Secondary | ICD-10-CM | POA: Diagnosis present

## 2016-06-02 DIAGNOSIS — Z5111 Encounter for antineoplastic chemotherapy: Secondary | ICD-10-CM | POA: Diagnosis not present

## 2016-06-02 DIAGNOSIS — D509 Iron deficiency anemia, unspecified: Secondary | ICD-10-CM | POA: Diagnosis not present

## 2016-06-02 DIAGNOSIS — C182 Malignant neoplasm of ascending colon: Secondary | ICD-10-CM

## 2016-06-02 LAB — CBC WITH DIFFERENTIAL/PLATELET
BASO%: 1.2 % (ref 0.0–2.0)
Basophils Absolute: 0.1 10*3/uL (ref 0.0–0.1)
EOS ABS: 0.3 10*3/uL (ref 0.0–0.5)
EOS%: 3.9 % (ref 0.0–7.0)
HCT: 44.4 % (ref 38.4–49.9)
HGB: 13.8 g/dL (ref 13.0–17.1)
LYMPH%: 10.1 % — AB (ref 14.0–49.0)
MCH: 24.9 pg — AB (ref 27.2–33.4)
MCHC: 31.1 g/dL — ABNORMAL LOW (ref 32.0–36.0)
MCV: 80 fL (ref 79.3–98.0)
MONO#: 0.5 10*3/uL (ref 0.1–0.9)
MONO%: 6 % (ref 0.0–14.0)
NEUT%: 78.8 % — ABNORMAL HIGH (ref 39.0–75.0)
NEUTROS ABS: 6.2 10*3/uL (ref 1.5–6.5)
Platelets: 173 10*3/uL (ref 140–400)
RBC: 5.55 10*6/uL (ref 4.20–5.82)
RDW: 19.9 % — ABNORMAL HIGH (ref 11.0–14.6)
WBC: 7.9 10*3/uL (ref 4.0–10.3)
lymph#: 0.8 10*3/uL — ABNORMAL LOW (ref 0.9–3.3)

## 2016-06-02 LAB — COMPREHENSIVE METABOLIC PANEL
ALT: 15 U/L (ref 0–55)
ANION GAP: 9 meq/L (ref 3–11)
AST: 18 U/L (ref 5–34)
Albumin: 3.3 g/dL — ABNORMAL LOW (ref 3.5–5.0)
Alkaline Phosphatase: 163 U/L — ABNORMAL HIGH (ref 40–150)
BUN: 18.5 mg/dL (ref 7.0–26.0)
CHLORIDE: 103 meq/L (ref 98–109)
CO2: 25 meq/L (ref 22–29)
Calcium: 9.3 mg/dL (ref 8.4–10.4)
Creatinine: 0.9 mg/dL (ref 0.7–1.3)
Glucose: 119 mg/dl (ref 70–140)
Potassium: 4.2 mEq/L (ref 3.5–5.1)
SODIUM: 137 meq/L (ref 136–145)
Total Bilirubin: 0.55 mg/dL (ref 0.20–1.20)
Total Protein: 7.2 g/dL (ref 6.4–8.3)

## 2016-06-02 LAB — FERRITIN: FERRITIN: 68 ng/mL (ref 22–316)

## 2016-06-02 MED ORDER — DEXAMETHASONE SODIUM PHOSPHATE 10 MG/ML IJ SOLN
INTRAMUSCULAR | Status: AC
  Filled 2016-06-02: qty 1

## 2016-06-02 MED ORDER — FLUOROURACIL CHEMO INJECTION 2.5 GM/50ML
400.0000 mg/m2 | Freq: Once | INTRAVENOUS | Status: AC
  Administered 2016-06-02: 950 mg via INTRAVENOUS
  Filled 2016-06-02: qty 19

## 2016-06-02 MED ORDER — PALONOSETRON HCL INJECTION 0.25 MG/5ML
INTRAVENOUS | Status: AC
  Filled 2016-06-02: qty 5

## 2016-06-02 MED ORDER — PALONOSETRON HCL INJECTION 0.25 MG/5ML
0.2500 mg | Freq: Once | INTRAVENOUS | Status: AC
  Administered 2016-06-02: 0.25 mg via INTRAVENOUS

## 2016-06-02 MED ORDER — OXALIPLATIN CHEMO INJECTION 100 MG/20ML
85.0000 mg/m2 | Freq: Once | INTRAVENOUS | Status: AC
  Administered 2016-06-02: 200 mg via INTRAVENOUS
  Filled 2016-06-02: qty 40

## 2016-06-02 MED ORDER — DEXTROSE 5 % IV SOLN
Freq: Once | INTRAVENOUS | Status: AC
  Administered 2016-06-02: 12:00:00 via INTRAVENOUS

## 2016-06-02 MED ORDER — SODIUM CHLORIDE 0.9 % IV SOLN
2400.0000 mg/m2 | INTRAVENOUS | Status: DC
  Administered 2016-06-02: 5700 mg via INTRAVENOUS
  Filled 2016-06-02: qty 114

## 2016-06-02 MED ORDER — DEXAMETHASONE SODIUM PHOSPHATE 10 MG/ML IJ SOLN
10.0000 mg | Freq: Once | INTRAMUSCULAR | Status: AC
  Administered 2016-06-02: 10 mg via INTRAVENOUS

## 2016-06-02 MED ORDER — LEUCOVORIN CALCIUM INJECTION 350 MG
400.0000 mg/m2 | Freq: Once | INTRAVENOUS | Status: AC
  Administered 2016-06-02: 948 mg via INTRAVENOUS
  Filled 2016-06-02: qty 47.4

## 2016-06-02 NOTE — Addendum Note (Signed)
Addended by: [name] on: 06/02/2016 12:27 PM   Modules accepted: Orders

## 2016-06-02 NOTE — Patient Instructions (Signed)
[facility] Discharge Instructions for Patients Receiving Chemotherapy  Today you received the following chemotherapy agents Oxaliplatin, Leucovorin and 5FU.  To help prevent nausea and vomiting after your treatment, we encourage you to take your nausea medication.   If you develop nausea and vomiting that is not controlled by your nausea medication, call the clinic.   BELOW ARE SYMPTOMS THAT SHOULD BE REPORTED IMMEDIATELY:  *FEVER GREATER THAN 100.5 F  *CHILLS WITH OR WITHOUT FEVER  NAUSEA AND VOMITING THAT IS NOT CONTROLLED WITH YOUR NAUSEA MEDICATION  *UNUSUAL SHORTNESS OF BREATH  *UNUSUAL BRUISING OR BLEEDING  TENDERNESS IN MOUTH AND THROAT WITH OR WITHOUT PRESENCE OF ULCERS  *URINARY PROBLEMS  *BOWEL PROBLEMS  UNUSUAL RASH Items with * indicate a potential emergency and should be followed up as soon as possible.  Feel free to call the clinic you have any questions or concerns. The clinic phone number is [phone].  Please show the CHEMO ALERT CARD at check-in to the Emergency Department and triage nurse.   

## 2016-06-02 NOTE — Progress Notes (Signed)
Oncology Nurse Navigator Documentation  Oncology Nurse Navigator Flowsheets 06/02/2016  Navigator Location [facility]  Referral date to RadOnc/MedOnc -  Navigator Encounter Type Treatment  Telephone -  Abnormal Finding Date -  Confirmed Diagnosis Date -  Surgery Date -  Multidisiplinary Clinic Date -  Multidisiplinary Clinic Type -  Treatment Initiated Date 06/02/2016  Patient Visit Type MedOnc  Treatment Phase First Chemo Tx--FOLFOX  Barriers/Navigation Needs No barriers at this time;No Questions;No Needs  Education -  Interventions None required  Education Method -  Support Groups/Services GI Support Group  Acuity Level 1  Time Spent with Patient 15  Met with patient and his wife, Steven Barnett during initial chemotherapy treatment to provide support and assess for needs to promote continuity of care.

## 2016-06-04 ENCOUNTER — Ambulatory Visit (HOSPITAL_BASED_OUTPATIENT_CLINIC_OR_DEPARTMENT_OTHER): Payer: Medicaid Other

## 2016-06-04 VITALS — BP 112/67 | HR 44 | Temp 98.4°F | Resp 16

## 2016-06-04 DIAGNOSIS — C180 Malignant neoplasm of cecum: Secondary | ICD-10-CM | POA: Diagnosis not present

## 2016-06-04 DIAGNOSIS — Z452 Encounter for adjustment and management of vascular access device: Secondary | ICD-10-CM | POA: Diagnosis present

## 2016-06-04 DIAGNOSIS — C182 Malignant neoplasm of ascending colon: Secondary | ICD-10-CM

## 2016-06-04 MED ORDER — HEPARIN SOD (PORK) LOCK FLUSH 100 UNIT/ML IV SOLN
500.0000 [IU] | Freq: Once | INTRAVENOUS | Status: AC | PRN
  Administered 2016-06-04: 500 [IU]
  Filled 2016-06-04: qty 5

## 2016-06-04 MED ORDER — SODIUM CHLORIDE 0.9% FLUSH
10.0000 mL | INTRAVENOUS | Status: DC | PRN
  Administered 2016-06-04: 10 mL
  Filled 2016-06-04: qty 10

## 2016-06-07 ENCOUNTER — Ambulatory Visit (HOSPITAL_BASED_OUTPATIENT_CLINIC_OR_DEPARTMENT_OTHER): Payer: Medicaid Other | Admitting: Genetic Counselor

## 2016-06-07 ENCOUNTER — Other Ambulatory Visit (HOSPITAL_BASED_OUTPATIENT_CLINIC_OR_DEPARTMENT_OTHER): Payer: Medicaid Other

## 2016-06-07 DIAGNOSIS — Z8371 Family history of colonic polyps: Secondary | ICD-10-CM | POA: Diagnosis not present

## 2016-06-07 DIAGNOSIS — Z800 Family history of malignant neoplasm of digestive organs: Secondary | ICD-10-CM | POA: Diagnosis not present

## 2016-06-07 DIAGNOSIS — Z808 Family history of malignant neoplasm of other organs or systems: Secondary | ICD-10-CM | POA: Diagnosis not present

## 2016-06-07 DIAGNOSIS — Z809 Family history of malignant neoplasm, unspecified: Secondary | ICD-10-CM

## 2016-06-07 DIAGNOSIS — C182 Malignant neoplasm of ascending colon: Secondary | ICD-10-CM

## 2016-06-07 DIAGNOSIS — C180 Malignant neoplasm of cecum: Secondary | ICD-10-CM | POA: Diagnosis present

## 2016-06-07 DIAGNOSIS — Z315 Encounter for genetic counseling: Secondary | ICD-10-CM

## 2016-06-07 DIAGNOSIS — Z801 Family history of malignant neoplasm of trachea, bronchus and lung: Secondary | ICD-10-CM

## 2016-06-07 DIAGNOSIS — Z83719 Family history of colon polyps, unspecified: Secondary | ICD-10-CM

## 2016-06-07 LAB — CBC WITH DIFFERENTIAL/PLATELET
BASO%: 0.7 % (ref 0.0–2.0)
Basophils Absolute: 0.1 10*3/uL (ref 0.0–0.1)
EOS%: 2.4 % (ref 0.0–7.0)
Eosinophils Absolute: 0.2 10*3/uL (ref 0.0–0.5)
HEMATOCRIT: 44.8 % (ref 38.4–49.9)
HGB: 13.9 g/dL (ref 13.0–17.1)
LYMPH#: 0.9 10*3/uL (ref 0.9–3.3)
LYMPH%: 11.3 % — ABNORMAL LOW (ref 14.0–49.0)
MCH: 24.9 pg — ABNORMAL LOW (ref 27.2–33.4)
MCHC: 31 g/dL — AB (ref 32.0–36.0)
MCV: 80.5 fL (ref 79.3–98.0)
MONO#: 0.4 10*3/uL (ref 0.1–0.9)
MONO%: 5.4 % (ref 0.0–14.0)
NEUT%: 80.2 % — AB (ref 39.0–75.0)
NEUTROS ABS: 6.6 10*3/uL — AB (ref 1.5–6.5)
Platelets: 196 10*3/uL (ref 140–400)
RBC: 5.57 10*6/uL (ref 4.20–5.82)
RDW: 19.7 % — AB (ref 11.0–14.6)
WBC: 8.2 10*3/uL (ref 4.0–10.3)

## 2016-06-08 ENCOUNTER — Encounter: Payer: Self-pay | Admitting: Genetic Counselor

## 2016-06-08 DIAGNOSIS — Z8371 Family history of colonic polyps: Secondary | ICD-10-CM | POA: Insufficient documentation

## 2016-06-08 DIAGNOSIS — Z800 Family history of malignant neoplasm of digestive organs: Secondary | ICD-10-CM | POA: Insufficient documentation

## 2016-06-08 NOTE — Progress Notes (Signed)
REFERRING PROVIDER: [name], MD  PRIMARY PROVIDER:  Vedia Coffer E, PA-C  PRIMARY REASON FOR VISIT:  1. Cancer of right colon ([place])   2. Family history of colon cancer   3. Family history of colonic polyps   4. Family history of bone cancer   5. Family history of lung cancer   6. Family history of cancer      HISTORY OF PRESENT ILLNESS:   Steven Barnett, a 65 y.o. male, was seen for a [place] cancer genetics consultation at the request of Dr. Burr Medico due to a personal history of colon cancer and family history of colon cancer and colon polyps.  Steven Barnett presents to clinic today to discuss the possibility of a hereditary predisposition to cancer, genetic testing, and to further clarify his future cancer risks, as well as potential cancer risks for family members.   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 This was treated with laparoscopic right hemicolectomy and is currently undergoing chemotherapy.  Tumor studies were ordered for the cecal tumor.  Microsatellite testing was MSI-High and a loss of MLH1 and PMS2 was identified on immunohistochemistry.  Tumor testing was negative for a BRAF mutation, but hypermethylation of the MLH1 promoter was present.    CANCER HISTORY:  Oncology History   Cancer of right colon [facility])   Staging form: Colon and Rectum, AJCC 7th Edition   - Clinical stage from 04/12/2016: Stage IIIA (T2, N1, M0) - Signed by [name], MD on 05/01/2016      Cancer of right colon ([place])   03/04/2016 Procedure    COLONOSCOPY: A polypoid and ulcerated non-obstructing large mass was found in the cecum. The mass was noncircumferential. The mass measured three cm in length. In addition, its diameter measured three mm. (Dr. [name])       03/15/2016 Imaging    1. Polypoid cecal mass. No findings of liver metastatic disease or other definite metastatic disease. There are some small adjacent pericecal lymph nodes which  are not pathologically enlarged. 2. Adenopathy in the chest is present but was also present in 2010, albeit slightly less prominent. This may be reactive adenopathy related to the chronic exudative right pleural effusion.       04/07/2016 Tumor Marker    Patient's tumor was tested for the following markers: CEA. Results of the tumor marker test revealed 2.7.      04/12/2016 Initial Diagnosis    Cecal cancer (Steven Barnett)     04/12/2016 Definitive Surgery    Laparoscopic right hemicolectomy (Dr. [name])      04/12/2016 Pathologic Stage    pT2 pN1 pMX--Grade 3 adenocarcinoma with 2/16 nodes positive, clear margins, negative for perineural or lymphvascular invasion; invades muscularis propria Loss of expression MLH1/PMS2        RISK FACTORS:  Colonoscopy: this was his first colonoscopy - colon cancer; colonoscopy report from 03/2016 states that two additional polyps were found and removed. Up to date with prostate exams:  no. 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 Anemia   . Arthralgia of both knees   . Arthritis    "right knee" (10/01/2015)  . Atrial fibrillation ([facility])   . Cancer ([facility])    STAGE 1 COLON CANCER  . Chronic anticoagulation 2010  Coumadin  . Chronic diastolic CHF (congestive heart failure), NYHA class 2 ([place])   . Hypertension   . OSA (obstructive sleep apnea) 2016   "couldn't take the mask during the testing" (10/01/2015)  . Permanent atrial fibrillation ([place]) 2010  . Pneumonia 3/29/2017and june 2017    Past Surgical History:  Procedure Laterality Date  . COLONOSCOPY WITH PROPOFOL N/A 03/04/2016   Procedure: COLONOSCOPY WITH PROPOFOL;  Surgeon: Carol Ada, MD;  Location: [facility];  Service: Endoscopy;  Laterality: N/A;  . LAPAROSCOPIC PARTIAL COLECTOMY N/A 04/12/2016    Procedure: LAPAROSCOPIC ILEOCOLECTOMY;  Surgeon: [name], MD;  Location: [facility];  Service: General;  Laterality: N/A;  . PORTACATH PLACEMENT N/A 05/18/2016   Procedure: INSERTION PORT-A-CATH;  Surgeon: [name], MD;  Location: [facility];  Service: General;  Laterality: N/A;  . THORACOTOMY Right 2010    Social History   Social History  . Marital status: Married    Spouse name: Ronnette Juniper  . Number of children: N/A  . Years of education: N/A   Social History Main Topics  . Smoking status: Former Smoker    Packs/day: 0.10    Years: 2.00    Types: Cigarettes    Quit date: 12/16/1974  . Smokeless tobacco: Never Used     Comment: "1 pack cigarettes would last me a month"  . Alcohol use 0.0 oz/week     Comment: 10/01/2015 "might have1 6 pack of beer/year"  . Drug use:     Types: Cocaine, Marijuana     Comment: Hx polysubstance abuse, quit 2008  . Sexual activity: Not Currently   Other Topics Concern  . Not on file   Social History Narrative   Married, wife Ronnette Juniper (since 1979)     FAMILY HISTORY:  We obtained a detailed, 4-generation family history.  Significant diagnoses are listed below: Family History  Problem Relation Age of Onset  . Heart disease Father   . Heart failure Father   . Colon polyps Father     unspecified number - pt of intestine surgically resected  . Hypertension Mother   . Diabetes Mother   . Hyperlipidemia Mother   . Colon polyps Mother     unspecified number - pt of intestine surgically resected  . Dementia Mother     d. 34  . Stroke Maternal Grandmother   . Stroke Sister   . Colon cancer Sister 47    w/ "cancerous polyps" - unspecified number; underwent surgery and radiation  . Colon polyps Brother     unspecified number - polypectomies  . Colon cancer Sister     dx 97-60; s/p surgery and radiation  . Bone cancer Maternal Uncle     dx. 69s  . Lung cancer Paternal Grandfather     d. 80s-90s; lung cancer vs TB  . Colon  cancer Sister     dx. 98-60; s/p surgery and radiation  . Cancer Maternal Uncle     mother had about 7-8 other siblings, most passed at older ages, many of whom had some type of cancer  . Heart attack Neg Hx     Mr. Weatherall has three sons and one daughter, all of whom are in their 30s-early 15s.  He has six grandchildren.  None of his children or grandchildren have had cancer to his knowledge.  He has three full sisters and one full brother.  All three of his sisters have had colon cancer, diagnosed in their 31s-60.  He describes some of  these as "cancerous polyps", but does report that all three sisters had treatment--"surgery and radiation".  At least one of these sisters had additional colon polyps, but he is not sure how many she had.  His brother has not had cancer, but he has had colon polyps found and removed on colonoscopy.    Mr. Boesen mother also had a history of colon polyps of unspecified number.  He reports that she needed a section of her intestine removed due to polyps.  She passed away from dementia-related causes at 94.  She had 8-9 siblings, all of whom have passed away.  One brother was diagnosed with "bone cancer" in his 50s.  Mr. Mallozzi reports that many of the other siblings also died from cancers at older ages, but he is not sure what type of cancers and has no further information for these relatives.  He has limited information for his maternal first cousins.  His maternal grandparents passed away in their 80s-90s and did not have cancer to his knowledge.  He has no information for maternal great aunts/uncles and great grandparents.  Mr. Mathews father passed away at 43, but did not have cancer.  He also had a history of colon polyps requiring a resection of part of his intestine.  He had approximately 7-8 siblings, all of whom have passed away and for whom Mr. Casebolt has no information.  Mr. Rill also has no information for his paternal first cousins.  His paternal grandparents  passed away in their 80s-90s.  His grandmother did not have cancer to his knowledge.  His grandfather passed away from either lung cancer or tuberculosis--he was not a smoker.  Mr. Iwan is unaware of any previous family history of genetic testing for hereditary cancer risks.  Patient's maternal and paternal ancestors are of [place] American and Native American/[place] descent. There is no reported Ashkenazi Jewish ancestry. There is no known consanguinity.  GENETIC COUNSELING ASSESSMENT: REIGN BARTNICK is a 65 y.o. male with a personal and family history of colon cancer and polyps which is somewhat suggestive of a hereditary colon cancer/polyposis syndrome and predisposition to cancer. We, therefore, discussed and recommended the following at today's visit.   DISCUSSION: We reviewed the characteristics, features and inheritance patterns of hereditary cancer syndromes, particularly those caused by mutations within the Lynch syndrome, APC, and MUTYH genes. We also discussed genetic testing, including the appropriate family members to test, the process of testing, insurance coverage and turn-around-time for results. We discussed the implications of a negative, positive and/or variant of uncertain significant result.  We also discussed his tumor testing, which seems to suggest a sporadic colon cancer. However, his family history is still suspicious, so genetic testing is warranted. We recommended Mr. Muldrew pursue genetic testing for the 19-gene Colorectal Cancer Panel with MSH2 Exons 1-7 Inversion Analysis through [facility] [employer] [facility].  The Colorectal Cancer Panel offered by [facility] includes sequencing and/or duplication/deletion testing of the following 19 genes: APC, ATM, AXIN2, BMPR1A, CDH1, CHEK2, EPCAM, MLH1, MSH2, MSH6, MUTYH, PMS2, POLD1, POLE, PTEN, SCG5/GREM1, SMAD4, STK11, and TP53.    Based on Mr. Leyh personal and family history of cancer, he meets medical criteria for genetic testing. Mr. Powley  has [facility], so he is eligible for genetic testing at no cost.  He is advised to call us if he receive a bill in the mail, as this will have been sent in error.  PLAN: After considering the risks, benefits, and limitations, Mr. Regis  provided informed  consent to pursue genetic testing and the blood sample was sent to [facility] for analysis of the 19-gene Colorectal Cancer Panel with MSH2 Exons 1-7 Inversion Analysis. Results should be available within approximately 2-3 weeks' time, at which point they will be disclosed by telephone to Mr. Vivero, as will any additional recommendations warranted by these results. Mr. Stille will receive a summary of his genetic counseling visit and a copy of his results once available. This information will also be available in Epic. We encouraged Mr. Candelaria to remain in contact with cancer genetics annually so that we can continuously update the family history and inform him of any changes in cancer genetics and testing that may be of benefit for his family. Mr. Breeden questions were answered to his satisfaction today. Our contact information was provided should additional questions or concerns arise.  Thank you for the referral and allowing [place] to share in the care of your patient.   Jeanine Luz, MS, [facility] Certified Genetic Counselor [email or web address] Phone: [phone]  The patient was seen for a total of 45 minutes in face-to-face genetic counseling.  This patient was discussed with Drs. Magrinat, Lindi Adie and/or Burr Medico who agrees with the above.    _______________________________________________________________________ For Office Staff:  Number of people involved in session: 1 Was an Intern/ student involved with case: no

## 2016-06-14 NOTE — Progress Notes (Signed)
[facility]  [phone] Fax:([phone]) [phone]  Clinic Follow up Note   Patient Care Team: Elisabeth Cara, PA-C as PCP - General (Family Medicine) [name], MD as Consulting Physician (General Surgery) Tania Ade, RN as Registered Nurse 06/16/2016   CHIEF COMPLAINTS:  Stage III colon cancer  Oncology History   Cancer of right colon [facility])   Staging form: Colon and Rectum, AJCC 7th Edition   - Clinical stage from 04/12/2016: Stage IIIA (T2, N1, M0) - Signed by [name], MD on 05/01/2016      Cancer of right colon ([place])   03/04/2016 Procedure    COLONOSCOPY: A polypoid and ulcerated non-obstructing large mass was found in the cecum. The mass was noncircumferential. The mass measured three cm in length. In addition, its diameter measured three mm. (Dr. [name])       03/15/2016 Imaging    1. Polypoid cecal mass. No findings of liver metastatic disease or other definite metastatic disease. There are some small adjacent pericecal lymph nodes which are not pathologically enlarged. 2. Adenopathy in the chest is present but was also present in 2010, albeit slightly less prominent. This may be reactive adenopathy related to the chronic exudative right pleural effusion.       04/07/2016 Tumor Marker    Patient's tumor was tested for the following markers: CEA. Results of the tumor marker test revealed 2.7.      04/12/2016 Initial Diagnosis    Cecal cancer ([place])     04/12/2016 Definitive Surgery    Laparoscopic right hemicolectomy (Dr. [name])      04/12/2016 Pathologic Stage    pT2 pN1 pMX--Grade 3 adenocarcinoma with 2/16 nodes positive, clear margins, negative for perineural or lymphvascular invasion; invades muscularis propria Loss of expression MLH1/PMS2       HISTORY OF PRESENTING ILLNESS (04/27/2016):  Steven Barnett 65 y.o. male is here because of his recently diagnosed stage III colon cancer. She is accompanied by her wife to my  clinic today.  This was discovered by screening colonoscopy in 03/2016. At the time of his screening colonoscopy, he was found to have anemia and iron deficiency. He has had mild to moderate fatigue and dyspnea on exertion for several months. Colonoscopy showed a large no obstructive mass in the cecum, biopsy showed adenocarcinoma. He was referred to see surgeon Dr. [name] and underwent right hemicolectomy on 04/12/2016. He has been recovering slowly from surgery, still has mild to moderate pain at the incision, his energy level and appetite has not recovered well.   He was hospitalized for CHF in 09/2015 and was found to have pleural effusion, he is on lasix. He is able to do all his ADLs, and light activities, such as house work. He is not very physically active.    CURRENT THERAPY: Ajuvant chemotherapy FOLFOX, every 2 weeks, starting 06/02/2016  INTERIM HISTORY:  Steven Barnett returns for follow-up and his 2nd cycle of chemotherapy. States his first chemo went well. The patient reports cold sensitivity, but no peripheral neuropathy. Denies leg swelling, nausea, cough, or diarrhea. He has good appetite and eating well.    MEDICAL HISTORY:  Past Medical History:  Diagnosis Date  . Anemia   . Arthralgia of both knees   . Arthritis    "right knee" (10/01/2015)  . Atrial fibrillation ([place])   . Cancer (HCC)    STAGE 1 COLON CANCER  . Chronic anticoagulation 2010   Coumadin  . Chronic diastolic CHF (congestive heart failure),  NYHA class 2 ([place])   . Hypertension   . OSA (obstructive sleep apnea) 2016   "couldn't take the mask during the testing" (10/01/2015)  . Permanent atrial fibrillation ([place]) 2010  . Pneumonia 3/29/2017and june 2017    SURGICAL HISTORY: Past Surgical History:  Procedure Laterality Date  . COLONOSCOPY WITH PROPOFOL N/A 03/04/2016   Procedure: COLONOSCOPY WITH PROPOFOL;  Surgeon: Carol Ada, MD;  Location: [facility];  Service: Endoscopy;  Laterality: N/A;  . LAPAROSCOPIC  PARTIAL COLECTOMY N/A 04/12/2016   Procedure: LAPAROSCOPIC ILEOCOLECTOMY;  Surgeon: [name], MD;  Location: [facility];  Service: General;  Laterality: N/A;  . PORTACATH PLACEMENT N/A 05/18/2016   Procedure: INSERTION PORT-A-CATH;  Surgeon: [name], MD;  Location: [facility];  Service: General;  Laterality: N/A;  . THORACOTOMY Right 2010    SOCIAL HISTORY: Social History   Social History  . Marital status: Married    Spouse name: Ronnette Juniper  . Number of children: N/A  . Years of education: N/A   Occupational History  . Not on file.   Social History Main Topics  . Smoking status: Former Smoker    Packs/day: 0.10    Years: 2.00    Types: Cigarettes    Quit date: 12/16/1974  . Smokeless tobacco: Never Used     Comment: "1 pack cigarettes would last me a month"  . Alcohol use 0.0 oz/week     Comment: 10/01/2015 "might have1 6 pack of beer/year"  . Drug use:     Types: Cocaine, Marijuana     Comment: Hx polysubstance abuse, quit 2008  . Sexual activity: Not Currently   Other Topics Concern  . 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   FAMILY HISTORY: Family History  Problem Relation Age of Onset  . Heart disease Father   . Heart failure Father   . Colon polyps Father     unspecified number - pt of intestine surgically resected  . Hypertension Mother   . Diabetes Mother   . Hyperlipidemia Mother   . Colon polyps Mother     unspecified number - pt of intestine surgically resected  . Dementia Mother     d. 44  . Stroke Maternal Grandmother   . Stroke Sister   . Colon cancer Sister 56    w/ "cancerous polyps" - unspecified number; underwent surgery and radiation  . Colon polyps Brother     unspecified number - polypectomies  . Colon cancer Sister     dx 66-60; s/p surgery and radiation  . Bone cancer Maternal Uncle     dx. 51s  . Lung cancer Paternal Grandfather       d. 80s-90s; lung cancer vs TB  . Colon cancer Sister     dx. 37-60; s/p surgery and radiation  . Cancer Maternal Uncle     mother had about 7-8 other siblings, most passed at older ages, many of whom had some type of cancer  . Heart attack Neg Hx     ALLERGIES:  is allergic to no known allergies.  MEDICATIONS:  Current Outpatient Prescriptions  Medication Sig Dispense Refill  . acetaminophen (TYLENOL) 500 MG tablet Take 1,000 mg by mouth 2 (two) times daily.    Marland Kitchen albuterol (PROVENTIL HFA;VENTOLIN HFA) 108 (90 Base) MCG/ACT inhaler Inhale 2 puffs into the lungs every 6 (six) hours as needed for wheezing or shortness  of breath. 1 Inhaler 2  . aspirin EC 81 MG tablet Take 81 mg by mouth daily.    Marland Kitchen diltiazem (CARDIZEM) 120 MG tablet Take 120 mg by mouth daily.    Marland Kitchen docusate sodium (COLACE) 100 MG capsule Take 1 capsule (100 mg total) by mouth 2 (two) times daily. For constipation 60 capsule 3  . ferrous sulfate 325 (65 FE) MG tablet Take 1 tablet (325 mg total) by mouth 2 (two) times daily with a meal. 60 tablet 3  . furosemide (LASIX) 80 MG tablet TAKE ONE TABLET BY MOUTH TWICE DAILY 60 tablet 10  . guaiFENesin (MUCINEX) 600 MG 12 hr tablet Take 1,200 mg by mouth 2 (two) times daily.    . hydrALAZINE (APRESOLINE) 50 MG tablet TAKE ONE TABLET BY MOUTH TWICE DAILY 60 tablet 6  . ipratropium (ATROVENT HFA) 17 MCG/ACT inhaler Inhale 2 puffs into the lungs as needed for wheezing.    . lidocaine-prilocaine (EMLA) cream Apply to affected area once 30 g 3  . lisinopril (PRINIVIL,ZESTRIL) 20 MG tablet TAKE ONE TABLET BY MOUTH ONCE DAILY 30 tablet 10  . LORazepam (ATIVAN) 0.5 MG tablet Take 1 tablet (0.5 mg total) by mouth every 6 (six) hours as needed (Nausea or vomiting). 30 tablet 0  . metoprolol tartrate (LOPRESSOR) 25 MG tablet Take 1 tablet (25 mg total) by mouth 2 (two) times daily. 30 tablet 5  . ondansetron (ZOFRAN) 8 MG tablet Take 1 tablet (8 mg total) by mouth 2 (two) times daily as  needed for refractory nausea / vomiting. Start on day 3 after chemotherapy. 30 tablet 1  . potassium chloride SA (K-DUR,KLOR-CON) 20 MEQ tablet Take 2 tablets (40 mEq total) by mouth daily. 180 tablet 3  . prochlorperazine (COMPAZINE) 10 MG tablet Take 1 tablet (10 mg total) by mouth every 6 (six) hours as needed (Nausea or vomiting). 30 tablet 1  . sildenafil (REVATIO) 20 MG tablet TAKE TWO TO THREE TABLETS BY MOUTH ONCE DAILY AS NEEDED FOR SEXUAL ACTIVITY 30 tablet 2  . warfarin (COUMADIN) 4 MG tablet TAKE TWO TABLETS BY MOUTH ONCE DAILY OR  AS  DIRECTED  BY  THE  COUMADIN  CLINIC 60 tablet 0  . guaiFENesin-dextromethorphan (ROBITUSSIN DM) 100-10 MG/5ML syrup Take 5 mLs by mouth every 4 (four) hours as needed for cough. (Patient not taking: Reported on 06/16/2016) 118 mL 0  . oxyCODONE (OXY IR/ROXICODONE) 5 MG immediate release tablet Take 1-2 tablets (5-10 mg total) by mouth every 6 (six) hours as needed for moderate pain, severe pain or breakthrough pain. (Patient not taking: Reported on 06/16/2016) 10 tablet 0   No current facility-administered medications for this visit.     REVIEW OF SYSTEMS: Constitutional: Denies fevers, chills or abnormal night sweats, (+) fatigue  Eyes: Denies blurriness of vision, double vision or watery eyes Ears, nose, mouth, throat, and face: Denies mucositis or sore throat Respiratory: Denies cough, dyspnea or wheezes Cardiovascular: Denies palpitation, chest discomfort or lower extremity swelling, Gastrointestinal:  Denies nausea, heartburn or change in bowel habits Skin: Denies abnormal skin rashes Lymphatics: Denies new lymphadenopathy or easy bruising Neurological:Denies numbness, tingling or new weaknesses Behavioral/Psych: Mood is stable, no new changes  All other systems were reviewed with the patient and are negative.  PHYSICAL EXAMINATION: ECOG PERFORMANCE STATUS: 1  Vitals:   06/16/16 0842  BP: (!) 123/53  Pulse: 97  Resp: 17  Temp: 97.8 F  (36.6 C)   Filed Weights   06/16/16 0842  Weight:  259 lb 9.6 oz (117.8 kg)    GENERAL:alert, no distress and comfortable SKIN: skin color, texture, turgor are normal, no rashes or significant lesions EYES: normal, conjunctiva are pink and non-injected, sclera clear OROPHARYNX:no exudate, no erythema and lips, buccal mucosa, and tongue normal  NECK: supple, thyroid normal size, non-tender, without nodularity LYMPH:  no palpable lymphadenopathy in the cervical, axillary or inguinal LUNGS: clear to auscultation and percussion with normal breathing effort HEART: regular rate & rhythm and no murmurs and no lower extremity edema ABDOMEN:abdomen soft, non-tender and normal bowel sounds. (+) midline incision appears healing well, no discharge Musculoskeletal:no cyanosis of digits and no clubbing  PSYCH: alert & oriented x 3 with fluent speech NEURO: no focal motor/sensory deficits  LABORATORY DATA:  I have reviewed the data as listed CBC Latest Ref Rng & Units 06/16/2016 06/07/2016 06/02/2016  WBC 4.0 - 10.3 10e3/uL 5.2 8.2 7.9  Hemoglobin 13.0 - 17.1 g/dL 14.2 13.9 13.8  Hematocrit 38.4 - 49.9 % 43.9 44.8 44.4  Platelets 140 - 400 10e3/uL 204 196 173   CMP Latest Ref Rng & Units 06/16/2016 06/02/2016 05/11/2016  Glucose 70 - 140 mg/dl 88 119 86  BUN 7.0 - 26.0 mg/dL 15.5 18.5 14.0  Creatinine 0.7 - 1.3 mg/dL 1.1 0.9 1.0  Sodium 136 - 145 mEq/L 137 137 138  Potassium 3.5 - 5.1 mEq/L 4.1 4.2 4.5  Chloride 101 - 111 mmol/L - - -  CO2 22 - 29 mEq/L 27 25 30(H)  Calcium 8.4 - 10.4 mg/dL 9.3 9.3 9.4  Total Protein 6.4 - 8.3 g/dL 7.1 7.2 8.0  Total Bilirubin 0.20 - 1.20 mg/dL 0.89 0.55 0.74  Alkaline Phos 40 - 150 U/L 128 163(H) 115  AST 5 - 34 U/L 15 18 21   ALT 0 - 55 U/L 16 15 16    CEA: 04/07/16: 2.7 04/27/2016: 2.11  PATHOLOGY  Diagnosis 04/12/2016 Colon, segmental resection for tumor, Right and Terminal Ileum HIGH GRADE ADENOCARCINOMA OF THE CECUM (3 CM), GRADE 3 THE CARCINOMA  INVADES MUSCULARIS PROPRIA (PT2) ALL MARGINS OF RESECTION ARE NEGATIVE FOR TUMOR METASTATIC ADENOCARCINOMA IN TWO OF SIXTEEN LYMPH NODES (2/16) UNREMARKABLE APPENDIX Microscopic Comment COLON AND RECTUM (INCLUDING TRANS-ANAL RESECTION): Specimen: Right colon and terminal ileum Procedure: Segmental resection Tumor site: Cecum Specimen integrity: Intact Macroscopic intactness of mesorectum: Not applicable: x Complete: NA Near complete: NA Incomplete: NA Cannot be determined (specify): NA Macroscopic tumor perforation: Invasion of the muscularis propria Invasive tumor: Maximum size: 3 cm Histologic type(s): Adenocarcinoma Histologic grade and differentiation: G3 G1: well differentiated/low grade G2: moderately differentiated/low grade G3: poorly differentiated/high grade G4: undifferentiated/high grade Type of polyp in which invasive carcinoma arose: Tubular adenoma 1 of 5 Supplemental copy SUPPLEMENTAL for DEMETRIOUS, RAINFORD ([id number]) Microscopic Comment(continued) Microscopic extension of invasive tumor: Muscularis propria Lymph-Vascular invasion: Negative Peri-neural invasion: Negative Tumor deposit(s) (discontinuous extramural extension): Negative Resection margins: Proximal margin: Negative Distal margin: Negative Circumferential (radial) (posterior ascending, posterior descending; lateral and posterior mid-rectum; and entire lower 1/3 rectum):Negative Mesenteric margin (sigmoid and transverse): NA Distance closest margin (if all above margins negative): 3 cm Trans-anal resection margins only: Deep margin: NA Mucosal Margin: NA Distance closest mucosal margin (if negative): NA Treatment effect (neo-adjuvant therapy): Negative Additional polyp(s): Negative Non-neoplastic findings: Unremarkable Lymph nodes: number examined 16; number positive: 2 Pathologic Staging: pT2, pN1, Mx  MMR: (+) loss of MLH1 and PMS2  MSI-H, MLH-1 hypermethylation presents, BRAF mutation  (-)  RADIOGRAPHIC STUDIES: I have personally reviewed the radiological images as listed  and agreed with the findings in the report.  CT chest, abdomen and pelvis 03/15/2016 IMPRESSION: 1. Polypoid cecal mass. No findings of liver metastatic disease or other definite metastatic disease. There are some small adjacent pericecal lymph nodes which are not pathologically enlarged. 2. Adenopathy in the chest is present but was also present in 2010, albeit slightly less prominent. This may be reactive adenopathy related to the chronic exudative right pleural effusion. 3. Prominent dilatation of the esophagus, nonspecific. Causes might include achalasia, neuropathy, scleroderma, pseudoachalasia, stricture, presbyesophagus, or Chagas disease. 4. Moderate 4 chamber cardiomegaly. 5. Liver morphology is suggestive but not absolutely diagnostic for early cirrhosis. 6.  Prominent stool throughout the colon favors constipation. 7. Left iliopsoas bursitis. 8. Possible varicoceles in the scrotum bilaterally. 9. Impingement at L4-5 due to spondylosis and degenerative disc disease.   Colonoscopy 03/04/2016 Dr. [name]  A polypoid and ulcerated non-obstructing large mass was found in the cecum. The mass was noncircumferential. The mass measured three cm in length. In addition, its diameter measured three mm. No bleeding was present. This was biopsied with a cold forceps for histology. Findings: A 2 mm polyp was found in the transverse colon. The polyp was sessile. The polyp was removed with a cold biopsy forceps. Resection and retrieval were complete. A small ascending colon polyp was identified, image #3, near the mass. This polyp was not removed as it is anticipated that it will be removed with the mass surgically. - Likely malignant tumor in the cecum. Biopsied. - One 2 mm polyp in the transverse colon, removed with a cold biopsy forceps. Resected and retrieved.   ASSESSMENT & PLAN:  65 y.o. male,  with past medical history of hypertension, OSA, CHF, obesity, presented with fatigue, dyspnea on exertion, and anemia. Screening colonoscopy showed a cecal mass.  1. Right colon cancer, invasive adenocarcinoma,G3,  pT2N1M0, stage IIIA, MSI-H -I previously reviewed his CT scan findings, and surgical pathology results in great details with patient and her family members -He had a complete surgical resection -His tumor has MSI high, but ML H1 hypermethylation present, supports sporadic colon cancer, 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 -We previously discussed the risk of cancer recurrence after surgery. Due to his stage IIIA disease, he is at moderate to high risk of recurrence -We previously discussed the standard adjuvant chemotherapy for stage III colon cancer,  reduce the risk of cancer recurrence. -we previously discussed the regimens for adjuvant chemotherapy, including FOLFOX, CAPOX, or single agent Xeloda or 5-FU. Due to his comorbidities, I recommend FOLFOX for 3 months  --He has recovered her better from surgery, and started chemotherapy on 06/02/16, tolerated the first cycle well. -Lab reviewed, appropriate to undergo his second cycle today. -We reviewed how to use antiemetic meds at home.  2. Iron deficient anemia -Secondary to his colon cancer -His anemia has resolved -Continue oral iron pill  3. HTN, OSA, CHF, obesity -He will continue follow-up with his primary care physician -We previously discussed that chemotherapy may affect his blood pressure, and we will monitor his blood pressure closely. We will be cautious about IV fluids, given his history of CHF. -He is on lasix, which may need to be held after chemo if his oral intake decreases.  Plan -2nd cycle FOLFOX today. -lab, flush and treatment has been moved from 06/29/16 to 06/30/16. -Follow up 07/15/2015 with lab, flush, and treatment then.    All questions were answered. The patient knows to call the clinic with any problems,  questions or concerns. I spent  20 minutes counseling the patient face to face. The total time spent in the appointment was 25 minutes and more than 50% was on counseling.     [name], MD 06/16/2016   This document serves as a record of services personally performed by [name], MD. It was created on her behalf by Darcus Austin, a trained medical scribe. The creation of this record is based on the scribe's personal observations and the provider's statements to them. This document has been checked and approved by the attending provider.

## 2016-06-16 ENCOUNTER — Encounter: Payer: Self-pay | Admitting: Hematology

## 2016-06-16 ENCOUNTER — Ambulatory Visit: Payer: Medicaid Other

## 2016-06-16 ENCOUNTER — Telehealth: Payer: Self-pay | Admitting: Hematology

## 2016-06-16 ENCOUNTER — Ambulatory Visit (HOSPITAL_BASED_OUTPATIENT_CLINIC_OR_DEPARTMENT_OTHER): Payer: Medicaid Other | Admitting: Hematology

## 2016-06-16 ENCOUNTER — Other Ambulatory Visit (HOSPITAL_BASED_OUTPATIENT_CLINIC_OR_DEPARTMENT_OTHER): Payer: Medicaid Other

## 2016-06-16 ENCOUNTER — Ambulatory Visit (HOSPITAL_BASED_OUTPATIENT_CLINIC_OR_DEPARTMENT_OTHER): Payer: Medicaid Other

## 2016-06-16 VITALS — BP 123/53 | HR 97 | Temp 97.8°F | Resp 17 | Ht 70.0 in | Wt 259.6 lb

## 2016-06-16 DIAGNOSIS — I5032 Chronic diastolic (congestive) heart failure: Secondary | ICD-10-CM

## 2016-06-16 DIAGNOSIS — E669 Obesity, unspecified: Secondary | ICD-10-CM | POA: Diagnosis not present

## 2016-06-16 DIAGNOSIS — D509 Iron deficiency anemia, unspecified: Secondary | ICD-10-CM

## 2016-06-16 DIAGNOSIS — I509 Heart failure, unspecified: Secondary | ICD-10-CM | POA: Diagnosis not present

## 2016-06-16 DIAGNOSIS — C182 Malignant neoplasm of ascending colon: Secondary | ICD-10-CM

## 2016-06-16 DIAGNOSIS — Z5111 Encounter for antineoplastic chemotherapy: Secondary | ICD-10-CM | POA: Diagnosis not present

## 2016-06-16 DIAGNOSIS — I10 Essential (primary) hypertension: Secondary | ICD-10-CM

## 2016-06-16 DIAGNOSIS — C180 Malignant neoplasm of cecum: Secondary | ICD-10-CM

## 2016-06-16 DIAGNOSIS — G4733 Obstructive sleep apnea (adult) (pediatric): Secondary | ICD-10-CM

## 2016-06-16 DIAGNOSIS — Z95828 Presence of other vascular implants and grafts: Secondary | ICD-10-CM

## 2016-06-16 LAB — COMPREHENSIVE METABOLIC PANEL
ALBUMIN: 3.2 g/dL — AB (ref 3.5–5.0)
ALK PHOS: 128 U/L (ref 40–150)
ALT: 16 U/L (ref 0–55)
ANION GAP: 8 meq/L (ref 3–11)
AST: 15 U/L (ref 5–34)
BUN: 15.5 mg/dL (ref 7.0–26.0)
CALCIUM: 9.3 mg/dL (ref 8.4–10.4)
CHLORIDE: 101 meq/L (ref 98–109)
CO2: 27 mEq/L (ref 22–29)
CREATININE: 1.1 mg/dL (ref 0.7–1.3)
EGFR: 83 mL/min/{1.73_m2} — ABNORMAL LOW (ref >90)
Glucose: 88 mg/dl (ref 70–140)
POTASSIUM: 4.1 meq/L (ref 3.5–5.1)
Sodium: 137 mEq/L (ref 136–145)
Total Bilirubin: 0.89 mg/dL (ref 0.20–1.20)
Total Protein: 7.1 g/dL (ref 6.4–8.3)

## 2016-06-16 LAB — CBC WITH DIFFERENTIAL/PLATELET
BASO%: 0.8 % (ref 0.0–2.0)
BASOS ABS: 0 10*3/uL (ref 0.0–0.1)
EOS ABS: 0.2 10*3/uL (ref 0.0–0.5)
EOS%: 3.7 % (ref 0.0–7.0)
HEMATOCRIT: 43.9 % (ref 38.4–49.9)
HEMOGLOBIN: 14.2 g/dL (ref 13.0–17.1)
LYMPH%: 21.6 % (ref 14.0–49.0)
MCH: 25.4 pg — AB (ref 27.2–33.4)
MCHC: 32.3 g/dL (ref 32.0–36.0)
MCV: 78.5 fL — ABNORMAL LOW (ref 79.3–98.0)
MONO#: 0.7 10*3/uL (ref 0.1–0.9)
MONO%: 13.8 % (ref 0.0–14.0)
NEUT#: 3.1 10*3/uL (ref 1.5–6.5)
NEUT%: 60.1 % (ref 39.0–75.0)
PLATELETS: 204 10*3/uL (ref 140–400)
RBC: 5.59 10*6/uL (ref 4.20–5.82)
RDW: 18.2 % — ABNORMAL HIGH (ref 11.0–14.6)
WBC: 5.2 10*3/uL (ref 4.0–10.3)
lymph#: 1.1 10*3/uL (ref 0.9–3.3)
nRBC: 0 % (ref 0–0)

## 2016-06-16 MED ORDER — HEPARIN SOD (PORK) LOCK FLUSH 100 UNIT/ML IV SOLN
500.0000 [IU] | Freq: Once | INTRAVENOUS | Status: DC | PRN
  Filled 2016-06-16: qty 5

## 2016-06-16 MED ORDER — PALONOSETRON HCL INJECTION 0.25 MG/5ML
0.2500 mg | Freq: Once | INTRAVENOUS | Status: AC
  Administered 2016-06-16: 0.25 mg via INTRAVENOUS

## 2016-06-16 MED ORDER — LEUCOVORIN CALCIUM INJECTION 350 MG
400.0000 mg/m2 | Freq: Once | INTRAVENOUS | Status: AC
  Administered 2016-06-16: 948 mg via INTRAVENOUS
  Filled 2016-06-16: qty 47.4

## 2016-06-16 MED ORDER — DEXTROSE 5 % IV SOLN
Freq: Once | INTRAVENOUS | Status: AC
  Administered 2016-06-16: 10:00:00 via INTRAVENOUS

## 2016-06-16 MED ORDER — SODIUM CHLORIDE 0.9 % IV SOLN
2400.0000 mg/m2 | INTRAVENOUS | Status: DC
  Administered 2016-06-16: 5700 mg via INTRAVENOUS
  Filled 2016-06-16: qty 114

## 2016-06-16 MED ORDER — SODIUM CHLORIDE 0.9% FLUSH
10.0000 mL | INTRAVENOUS | Status: DC | PRN
  Filled 2016-06-16: qty 10

## 2016-06-16 MED ORDER — SODIUM CHLORIDE 0.9 % IV SOLN
INTRAVENOUS | Status: DC
  Administered 2016-06-16: 09:00:00 via INTRAVENOUS

## 2016-06-16 MED ORDER — DEXAMETHASONE SODIUM PHOSPHATE 10 MG/ML IJ SOLN
INTRAMUSCULAR | Status: AC
  Filled 2016-06-16: qty 1

## 2016-06-16 MED ORDER — SODIUM CHLORIDE 0.9% FLUSH
10.0000 mL | INTRAVENOUS | Status: DC | PRN
  Administered 2016-06-16: 10 mL via INTRAVENOUS
  Filled 2016-06-16: qty 10

## 2016-06-16 MED ORDER — PALONOSETRON HCL INJECTION 0.25 MG/5ML
INTRAVENOUS | Status: AC
  Filled 2016-06-16: qty 5

## 2016-06-16 MED ORDER — DEXAMETHASONE SODIUM PHOSPHATE 10 MG/ML IJ SOLN
10.0000 mg | Freq: Once | INTRAMUSCULAR | Status: AC
  Administered 2016-06-16: 10 mg via INTRAVENOUS

## 2016-06-16 MED ORDER — DEXTROSE 5 % IV SOLN
85.0000 mg/m2 | Freq: Once | INTRAVENOUS | Status: AC
  Administered 2016-06-16: 200 mg via INTRAVENOUS
  Filled 2016-06-16: qty 40

## 2016-06-16 MED ORDER — FLUOROURACIL CHEMO INJECTION 2.5 GM/50ML
400.0000 mg/m2 | Freq: Once | INTRAVENOUS | Status: AC
  Administered 2016-06-16: 950 mg via INTRAVENOUS
  Filled 2016-06-16: qty 19

## 2016-06-16 NOTE — Patient Instructions (Signed)

## 2016-06-16 NOTE — Patient Instructions (Signed)
[facility] Discharge Instructions for Patients Receiving Chemotherapy  Today you received the following chemotherapy agents Oxaliplatin, Leucovorin and 5FU.  To help prevent nausea and vomiting after your treatment, we encourage you to take your nausea medication.   If you develop nausea and vomiting that is not controlled by your nausea medication, call the clinic.   BELOW ARE SYMPTOMS THAT SHOULD BE REPORTED IMMEDIATELY:  *FEVER GREATER THAN 100.5 F  *CHILLS WITH OR WITHOUT FEVER  NAUSEA AND VOMITING THAT IS NOT CONTROLLED WITH YOUR NAUSEA MEDICATION  *UNUSUAL SHORTNESS OF BREATH  *UNUSUAL BRUISING OR BLEEDING  TENDERNESS IN MOUTH AND THROAT WITH OR WITHOUT PRESENCE OF ULCERS  *URINARY PROBLEMS  *BOWEL PROBLEMS  UNUSUAL RASH Items with * indicate a potential emergency and should be followed up as soon as possible.  Feel free to call the clinic you have any questions or concerns. The clinic phone number is [phone].  Please show the CHEMO ALERT CARD at check-in to the Emergency Department and triage nurse.   

## 2016-06-16 NOTE — Telephone Encounter (Signed)
Appointments scheduled per 12/14 LOS. Request to have 12/27 appointments rescheduled to 12/28 would not be possible per no availability for follow up with MD and the infusion room will be at full capacity on 12/28 per Alexis/ Sharyn Lull. Notified Desk Nurse.

## 2016-06-18 ENCOUNTER — Ambulatory Visit (HOSPITAL_BASED_OUTPATIENT_CLINIC_OR_DEPARTMENT_OTHER): Payer: Medicaid Other

## 2016-06-18 VITALS — BP 99/53 | HR 49 | Temp 98.1°F | Resp 19

## 2016-06-18 DIAGNOSIS — Z452 Encounter for adjustment and management of vascular access device: Secondary | ICD-10-CM | POA: Diagnosis present

## 2016-06-18 DIAGNOSIS — C182 Malignant neoplasm of ascending colon: Secondary | ICD-10-CM

## 2016-06-18 MED ORDER — HEPARIN SOD (PORK) LOCK FLUSH 100 UNIT/ML IV SOLN
500.0000 [IU] | Freq: Once | INTRAVENOUS | Status: AC | PRN
  Administered 2016-06-18: 500 [IU]
  Filled 2016-06-18: qty 5

## 2016-06-18 MED ORDER — SODIUM CHLORIDE 0.9% FLUSH
10.0000 mL | INTRAVENOUS | Status: DC | PRN
  Administered 2016-06-18: 10 mL
  Filled 2016-06-18: qty 10

## 2016-06-21 ENCOUNTER — Telehealth: Payer: Self-pay | Admitting: Hematology

## 2016-06-21 NOTE — Telephone Encounter (Signed)
Per YF r/s list cxd 12/27 f/u - lab/chemo only. Also added chemo to 1/11 f/u and pump for 1/13.   Left message re appointments.

## 2016-06-29 ENCOUNTER — Ambulatory Visit (HOSPITAL_BASED_OUTPATIENT_CLINIC_OR_DEPARTMENT_OTHER): Payer: Medicaid Other

## 2016-06-29 ENCOUNTER — Other Ambulatory Visit: Payer: Self-pay | Admitting: Hematology

## 2016-06-29 ENCOUNTER — Ambulatory Visit: Payer: Medicaid Other | Admitting: Hematology

## 2016-06-29 ENCOUNTER — Other Ambulatory Visit (HOSPITAL_BASED_OUTPATIENT_CLINIC_OR_DEPARTMENT_OTHER): Payer: Medicaid Other

## 2016-06-29 VITALS — BP 99/60 | HR 66 | Temp 97.7°F | Resp 20

## 2016-06-29 DIAGNOSIS — C180 Malignant neoplasm of cecum: Secondary | ICD-10-CM | POA: Diagnosis present

## 2016-06-29 DIAGNOSIS — C182 Malignant neoplasm of ascending colon: Secondary | ICD-10-CM

## 2016-06-29 DIAGNOSIS — Z5111 Encounter for antineoplastic chemotherapy: Secondary | ICD-10-CM | POA: Diagnosis not present

## 2016-06-29 DIAGNOSIS — Z95828 Presence of other vascular implants and grafts: Secondary | ICD-10-CM | POA: Insufficient documentation

## 2016-06-29 LAB — COMPREHENSIVE METABOLIC PANEL
ALBUMIN: 3.4 g/dL — AB (ref 3.5–5.0)
ALK PHOS: 187 U/L — AB (ref 40–150)
ALT: 16 U/L (ref 0–55)
AST: 21 U/L (ref 5–34)
Anion Gap: 8 mEq/L (ref 3–11)
BILIRUBIN TOTAL: 0.61 mg/dL (ref 0.20–1.20)
BUN: 12.5 mg/dL (ref 7.0–26.0)
CALCIUM: 9 mg/dL (ref 8.4–10.4)
CO2: 29 mEq/L (ref 22–29)
Chloride: 103 mEq/L (ref 98–109)
Creatinine: 1 mg/dL (ref 0.7–1.3)
EGFR: >90 mL/min/{1.73_m2} (ref >90)
GLUCOSE: 81 mg/dL (ref 70–140)
Potassium: 4.1 mEq/L (ref 3.5–5.1)
SODIUM: 139 meq/L (ref 136–145)
Total Protein: 7 g/dL (ref 6.4–8.3)

## 2016-06-29 LAB — CBC WITH DIFFERENTIAL/PLATELET
BASO%: 1.3 % (ref 0.0–2.0)
Basophils Absolute: 0.1 10*3/uL (ref 0.0–0.1)
EOS%: 5.4 % (ref 0.0–7.0)
Eosinophils Absolute: 0.2 10*3/uL (ref 0.0–0.5)
HEMATOCRIT: 43.2 % (ref 38.4–49.9)
HEMOGLOBIN: 13.7 g/dL (ref 13.0–17.1)
LYMPH#: 0.8 10*3/uL — AB (ref 0.9–3.3)
LYMPH%: 21.8 % (ref 14.0–49.0)
MCH: 25.3 pg — AB (ref 27.2–33.4)
MCHC: 31.7 g/dL — ABNORMAL LOW (ref 32.0–36.0)
MCV: 79.9 fL (ref 79.3–98.0)
MONO#: 0.7 10*3/uL (ref 0.1–0.9)
MONO%: 18.5 % — ABNORMAL HIGH (ref 0.0–14.0)
NEUT%: 53 % (ref 39.0–75.0)
NEUTROS ABS: 2 10*3/uL (ref 1.5–6.5)
NRBC: 1 % — AB (ref 0–0)
Platelets: 151 10*3/uL (ref 140–400)
RBC: 5.41 10*6/uL (ref 4.20–5.82)
RDW: 19.3 % — AB (ref 11.0–14.6)
WBC: 3.7 10*3/uL — ABNORMAL LOW (ref 4.0–10.3)

## 2016-06-29 LAB — FERRITIN: FERRITIN: 70 ng/mL (ref 22–316)

## 2016-06-29 MED ORDER — PALONOSETRON HCL INJECTION 0.25 MG/5ML
0.2500 mg | Freq: Once | INTRAVENOUS | Status: AC
  Administered 2016-06-29: 0.25 mg via INTRAVENOUS

## 2016-06-29 MED ORDER — FLUOROURACIL CHEMO INJECTION 2.5 GM/50ML
400.0000 mg/m2 | Freq: Once | INTRAVENOUS | Status: AC
Start: 2016-06-29 — End: 2016-06-29
  Administered 2016-06-29: 950 mg via INTRAVENOUS
  Filled 2016-06-29: qty 19

## 2016-06-29 MED ORDER — FLUOROURACIL CHEMO INJECTION 5 GM/100ML
2400.0000 mg/m2 | INTRAVENOUS | Status: DC
  Administered 2016-06-29: 5700 mg via INTRAVENOUS
  Filled 2016-06-29: qty 114

## 2016-06-29 MED ORDER — DEXTROSE 5 % IV SOLN
Freq: Once | INTRAVENOUS | Status: AC
  Administered 2016-06-29: 13:00:00 via INTRAVENOUS

## 2016-06-29 MED ORDER — DEXAMETHASONE SODIUM PHOSPHATE 10 MG/ML IJ SOLN
10.0000 mg | Freq: Once | INTRAMUSCULAR | Status: AC
  Administered 2016-06-29: 10 mg via INTRAVENOUS

## 2016-06-29 MED ORDER — PALONOSETRON HCL INJECTION 0.25 MG/5ML
INTRAVENOUS | Status: AC
  Filled 2016-06-29: qty 5

## 2016-06-29 MED ORDER — OXALIPLATIN CHEMO INJECTION 100 MG/20ML
85.0000 mg/m2 | Freq: Once | INTRAVENOUS | Status: AC
  Administered 2016-06-29: 200 mg via INTRAVENOUS
  Filled 2016-06-29: qty 40

## 2016-06-29 MED ORDER — DEXAMETHASONE SODIUM PHOSPHATE 10 MG/ML IJ SOLN
INTRAMUSCULAR | Status: AC
  Filled 2016-06-29: qty 1

## 2016-06-29 MED ORDER — LEUCOVORIN CALCIUM INJECTION 350 MG
400.0000 mg/m2 | Freq: Once | INTRAVENOUS | Status: AC
  Administered 2016-06-29: 948 mg via INTRAVENOUS
  Filled 2016-06-29: qty 47.4

## 2016-06-29 MED ORDER — SODIUM CHLORIDE 0.9% FLUSH
10.0000 mL | INTRAVENOUS | Status: DC | PRN
  Administered 2016-06-29: 10 mL via INTRAVENOUS
  Filled 2016-06-29: qty 10

## 2016-06-29 NOTE — Patient Instructions (Signed)
[facility] Discharge Instructions for Patients Receiving Chemotherapy  Today you received the following chemotherapy agents Oxaliplatin, Leucovorin and Adrucil.'  To help prevent nausea and vomiting after your treatment, we encourage you to take your nausea medication as directed. NO ZOFRAN FOR 3 DAYS.   If you develop nausea and vomiting that is not controlled by your nausea medication, call the clinic.   BELOW ARE SYMPTOMS THAT SHOULD BE REPORTED IMMEDIATELY:  *FEVER GREATER THAN 100.5 F  *CHILLS WITH OR WITHOUT FEVER  NAUSEA AND VOMITING THAT IS NOT CONTROLLED WITH YOUR NAUSEA MEDICATION  *UNUSUAL SHORTNESS OF BREATH  *UNUSUAL BRUISING OR BLEEDING  TENDERNESS IN MOUTH AND THROAT WITH OR WITHOUT PRESENCE OF ULCERS  *URINARY PROBLEMS  *BOWEL PROBLEMS  UNUSUAL RASH Items with * indicate a potential emergency and should be followed up as soon as possible.  Feel free to call the clinic you have any questions or concerns. The clinic phone number is [phone].  Please show the [place] at check-in to the Emergency Department and triage nurse.

## 2016-06-30 ENCOUNTER — Ambulatory Visit (INDEPENDENT_AMBULATORY_CARE_PROVIDER_SITE_OTHER): Payer: Medicaid Other | Admitting: Pharmacist

## 2016-06-30 DIAGNOSIS — Z7901 Long term (current) use of anticoagulants: Secondary | ICD-10-CM | POA: Diagnosis not present

## 2016-06-30 DIAGNOSIS — I4821 Permanent atrial fibrillation: Secondary | ICD-10-CM

## 2016-06-30 DIAGNOSIS — I482 Chronic atrial fibrillation: Secondary | ICD-10-CM | POA: Diagnosis not present

## 2016-06-30 LAB — POCT INR: INR: 4.6

## 2016-07-01 ENCOUNTER — Ambulatory Visit (HOSPITAL_BASED_OUTPATIENT_CLINIC_OR_DEPARTMENT_OTHER): Payer: Medicaid Other

## 2016-07-01 ENCOUNTER — Encounter: Payer: Self-pay | Admitting: Genetic Counselor

## 2016-07-01 ENCOUNTER — Telehealth: Payer: Self-pay | Admitting: Genetic Counselor

## 2016-07-01 ENCOUNTER — Ambulatory Visit: Payer: Self-pay | Admitting: Genetic Counselor

## 2016-07-01 VITALS — BP 94/62 | HR 92 | Temp 98.2°F | Resp 18

## 2016-07-01 DIAGNOSIS — Z8371 Family history of colonic polyps: Secondary | ICD-10-CM

## 2016-07-01 DIAGNOSIS — C182 Malignant neoplasm of ascending colon: Secondary | ICD-10-CM | POA: Diagnosis not present

## 2016-07-01 DIAGNOSIS — Z1379 Encounter for other screening for genetic and chromosomal anomalies: Secondary | ICD-10-CM | POA: Insufficient documentation

## 2016-07-01 DIAGNOSIS — Z800 Family history of malignant neoplasm of digestive organs: Secondary | ICD-10-CM

## 2016-07-01 DIAGNOSIS — Z452 Encounter for adjustment and management of vascular access device: Secondary | ICD-10-CM | POA: Diagnosis not present

## 2016-07-01 MED ORDER — HEPARIN SOD (PORK) LOCK FLUSH 100 UNIT/ML IV SOLN
500.0000 [IU] | Freq: Once | INTRAVENOUS | Status: AC | PRN
  Administered 2016-07-01: 500 [IU]
  Filled 2016-07-01: qty 5

## 2016-07-01 MED ORDER — SODIUM CHLORIDE 0.9% FLUSH
10.0000 mL | INTRAVENOUS | Status: DC | PRN
  Administered 2016-07-01: 10 mL
  Filled 2016-07-01: qty 10

## 2016-07-01 NOTE — Telephone Encounter (Signed)
Spoke with daughter, [name].  Explained that genetic testing is essentially negative.  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  We do not know why he developed cancer or why there is CRC in the family.  It could be due to another gene we are not able to test for, maybe this VUS is a pathogenic change.  Please keep in contact with [place] and over time we will have more information.  [name] voiced her understanding.

## 2016-07-01 NOTE — Progress Notes (Addendum)
HPI: Mr. Kretschmer was previously seen in the [facility] due to a personal and family history of cancer and concerns regarding a hereditary predisposition to cancer. Please refer to our prior cancer genetics clinic note for more information regarding Mr. Schalk medical, social and family histories, and our assessment and recommendations, at the time. Mr. Kadlec recent genetic test results were disclosed to him, as were recommendations warranted by these results. These results and recommendations are discussed in more detail below.  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 The mass was noncircumferential. The mass measured three cm in length. In addition, its diameter measured three mm. (Dr. [name])       03/15/2016 Imaging    1. Polypoid cecal mass. No findings of liver metastatic disease or other definite metastatic disease. There are some small adjacent pericecal lymph nodes which are not pathologically enlarged. 2. Adenopathy in the chest is present but was also present in 2010, albeit slightly less prominent. This may be reactive adenopathy related to the chronic exudative right pleural effusion.       04/07/2016 Tumor Marker    Patient's tumor was tested for the following markers: CEA. Results of the tumor marker test revealed 2.7.      04/12/2016 Initial Diagnosis    Cecal cancer ([place])     04/12/2016 Definitive Surgery    Laparoscopic right hemicolectomy (Dr. [name])      04/12/2016 Pathologic Stage    pT2 pN1 pMX--Grade 3 adenocarcinoma with 2/16 nodes positive, clear margins, negative for perineural or lymphvascular invasion; invades  muscularis propria Loss of expression MLH1/PMS2      06/29/2016 Genetic Testing    POLE c.4523G>A VUS identified on the Colorectal cancer panel.  Negative genetic testing for the MSH2 inversion analysis ([place] inversion). The Colorectal Cancer Panel offered by [facility] includes sequencing and/or duplication/deletion testing of the following 19 genes: APC, ATM, AXIN2, BMPR1A, CDH1, CHEK2, EPCAM, MLH1, MSH2, MSH6, MUTYH, PMS2, POLD1, POLE, PTEN, SCG5/GREM1, SMAD4, STK11, and TP53. The report date is 06/22/2016 for the [facility] panel and 06/29/2016 for the [place] inversion.       FAMILY HISTORY:  We obtained a detailed, 4-generation family history.  Significant diagnoses are listed below: Family History  Problem Relation Age of Onset  . Heart disease Father   . Heart failure Father   . Colon polyps Father     unspecified number - pt of intestine surgically resected  . Hypertension Mother   . Diabetes Mother   . Hyperlipidemia Mother   . Colon polyps Mother     unspecified number - pt of intestine surgically resected  . Dementia Mother     d. 98  . Stroke Maternal Grandmother   . Stroke Sister   . Colon cancer Sister 3    w/ "cancerous polyps" - unspecified number; underwent surgery and radiation  . Colon polyps Brother     unspecified number - polypectomies  . Colon cancer Sister     dx 26-60; s/p surgery and radiation  . Bone cancer Maternal Uncle     dx. 20s  . Lung cancer Paternal  Grandfather     d. 80s-90s; lung cancer vs TB  . Colon cancer Sister     dx. 6-60; s/p surgery and radiation  . Cancer Maternal Uncle     mother had about 7-8 other siblings, most passed at older ages, many of whom had some type of cancer  . Heart attack Neg Hx     Mr. Galan has three sons and one daughter, all of whom are in their 30s-early 41s.  He has six grandchildren.  None of his children or grandchildren have had cancer to his knowledge.  He has three full sisters and one full brother.  All  three of his sisters have had colon cancer, diagnosed in their 37s-60.  He describes some of these as "cancerous polyps", but does report that all three sisters had treatment--"surgery and radiation".  At least one of these sisters had additional colon polyps, but he is not sure how many she had.  His brother has not had cancer, but he has had colon polyps found and removed on colonoscopy.    Mr. Harrower mother also had a history of colon polyps of unspecified number.  He reports that she needed a section of her intestine removed due to polyps.  She passed away from dementia-related causes at 2.  She had 8-9 siblings, all of whom have passed away.  One brother was diagnosed with "bone cancer" in his 61s.  Mr. Meuser reports that many of the other siblings also died from cancers at older ages, but he is not sure what type of cancers and has no further information for these relatives.  He has limited information for his maternal first cousins.  His maternal grandparents passed away in their 80s-90s and did not have cancer to his knowledge.  He has no information for maternal great aunts/uncles and great grandparents.  Mr. Trimpe father passed away at 30, but did not have cancer.  He also had a history of colon polyps requiring a resection of part of his intestine.  He had approximately 7-8 siblings, all of whom have passed away and for whom Mr. Gabbard has no information.  Mr. Vanscyoc also has no information for his paternal first cousins.  His paternal grandparents passed away in their 80s-90s.  His grandmother did not have cancer to his knowledge.  His grandfather passed away from either lung cancer or tuberculosis--he was not a smoker.  Mr. Tosh is unaware of any previous family history of genetic testing for hereditary cancer risks.  Patient's maternal and paternal ancestors are of [place] American and Native American/[place] descent. There is no reported Ashkenazi Jewish ancestry. There is no known  consanguinity..  GENETIC TEST RESULTS: Genetic testing reported out on June 22, 2016 through the Colorectal cancer panel found no deleterious mutations.  The Colorectal Cancer Panel offered by [facility] includes sequencing and/or duplication/deletion testing of the following 19 genes: APC, ATM, AXIN2, BMPR1A, CDH1, CHEK2, EPCAM, MLH1, MSH2, MSH6, MUTYH, PMS2, POLD1, POLE, PTEN, SCG5/GREM1, SMAD4, STK11, and TP53. Negative genetic testing for the MSH2 inversion analysis ([place] inversion).   The test report has been scanned into EPIC and is located under the Molecular Pathology section of the Results Review tab.   We discussed with Mr. Selders that since the current genetic testing is not perfect, it is possible there may be a gene mutation in one of these genes that current testing cannot detect, but that chance is small. We also discussed, that it is possible that another gene that has  not yet been discovered, or that we have not yet tested, is responsible for the cancer diagnoses in the family, and it is, therefore, important to remain in touch with cancer genetics in the future so that we can continue to offer Mr. Sacra the most up to date genetic testing.   Genetic testing did detect a Variant of Unknown Significance in the POLE gene called c.4523G>A. At this time, it is unknown if this variant is associated with increased cancer risk or if this is a normal finding, but most variants such as this get reclassified to being inconsequential. It should not be used to make medical management decisions. With time, we suspect the lab will determine the significance of this variant, if any. If we do learn more about it, we will try to contact Mr. Cacciola to discuss it further. However, it is important to stay in touch with [place] periodically and keep the address and phone number up to date. POLE c.4523G>A VUS has been reclassified to a likely benign variant based on a combination of sources, e.g, internal data,  published literature, population databases and in silico models. The reclassification date is June 09, 2017.  CANCER SCREENING RECOMMENDATIONS: Given Mr. Overby personal and family histories, we must interpret these negative results with some caution.  Families with features suggestive of hereditary risk for cancer tend to have multiple family members with cancer, diagnoses in multiple generations and diagnoses before the age of 37. Mr. Want family exhibits some of these features. Thus this result may simply reflect our current inability to detect all mutations within these genes or there may be a different gene that has not yet been discovered or tested.   RECOMMENDATIONS FOR FAMILY MEMBERS: Individuals in this family might be at some increased risk of developing cancer, over the general population risk, simply due to the family history of cancer. We recommended women in this family have a yearly mammogram beginning at age 48, or 17 years younger than the earliest onset of cancer, an annual clinical breast exam, and perform monthly breast self-exams. Women in this family should also have a gynecological exam as recommended by their primary provider. All family members should have a colonoscopy by age 76, or 81 years younger than the earliest age of onset.  Based on Mr. Kolar family history, we recommended his sisters, who was diagnosed with colon cancer, have genetic counseling and testing. Mr. Soter will let us know if we can be of any assistance in coordinating genetic counseling and/or testing for this family member.   FOLLOW-UP: Lastly, we discussed with Mr. Prout that cancer genetics is a rapidly advancing field and it is possible that new genetic tests will be appropriate for him and/or his family members in the future. We encouraged him to remain in contact with cancer genetics on an annual basis so we can update his personal and family histories and let him know of advances in cancer  genetics that may benefit this family.   Our contact number was provided. Mr. Mergenthaler questions were answered to his satisfaction, and he knows he is welcome to call us at anytime with additional questions or concerns.   Roma Kayser, MS, [facility] Certified Genetic Counselor [email or web address]

## 2016-07-05 ENCOUNTER — Other Ambulatory Visit: Payer: Self-pay | Admitting: Internal Medicine

## 2016-07-05 NOTE — Telephone Encounter (Signed)
Rx request sent to pharmacy.  

## 2016-07-13 ENCOUNTER — Ambulatory Visit (INDEPENDENT_AMBULATORY_CARE_PROVIDER_SITE_OTHER): Payer: Medicaid Other | Admitting: Pharmacist

## 2016-07-13 DIAGNOSIS — I482 Chronic atrial fibrillation: Secondary | ICD-10-CM | POA: Diagnosis not present

## 2016-07-13 DIAGNOSIS — I4821 Permanent atrial fibrillation: Secondary | ICD-10-CM

## 2016-07-13 DIAGNOSIS — Z7901 Long term (current) use of anticoagulants: Secondary | ICD-10-CM

## 2016-07-13 LAB — POCT INR: INR: 3.9

## 2016-07-13 NOTE — Progress Notes (Signed)
[place]  [phone] [phone]  Clinic Follow up Note   Patient Care Team: Elisabeth Cara, PA-C as PCP - General (Family Medicine) [name], MD as Consulting Physician (General Surgery) Tania Ade, RN as Registered Nurse 07/14/2016   CHIEF COMPLAINTS:  Stage III colon cancer  Oncology History   Cancer of right colon [facility])   Staging form: Colon and Rectum, AJCC 7th Edition   - Clinical stage from 04/12/2016: Stage IIIA (T2, N1, M0) - Signed by [name], MD on 05/01/2016      Cancer of right colon ([place])   03/04/2016 Procedure    COLONOSCOPY: A polypoid and ulcerated non-obstructing large mass was found in the cecum. The mass was noncircumferential. The mass measured three cm in length. In addition, its diameter measured three mm. (Dr. [name])       03/15/2016 Imaging    1. Polypoid cecal mass. No findings of liver metastatic disease or other definite metastatic disease. There are some small adjacent pericecal lymph nodes which are not pathologically enlarged. 2. Adenopathy in the chest is present but was also present in 2010, albeit slightly less prominent. This may be reactive adenopathy related to the chronic exudative right pleural effusion.       04/07/2016 Tumor Marker    Patient's tumor was tested for the following markers: CEA. Results of the tumor marker test revealed 2.7.      04/12/2016 Initial Diagnosis    Cecal cancer ([place])     04/12/2016 Definitive Surgery    Laparoscopic right hemicolectomy (Dr. [name])      04/12/2016 Pathologic Stage    pT2 pN1 pMX--Grade 3 adenocarcinoma with 2/16 nodes positive, clear margins, negative for perineural or lymphvascular invasion; invades muscularis propria Loss of expression MLH1/PMS2      06/29/2016 Genetic Testing    POLE [id number] VUS identified on the Colorectal cancer panel.  Negative genetic testing for the MSH2 inversion analysis ([place] inversion). The  Colorectal Cancer Panel offered by [facility] includes sequencing and/or duplication/deletion testing of the following 19 genes: APC, ATM, AXIN2, BMPR1A, CDH1, CHEK2, EPCAM, MLH1, MSH2, MSH6, MUTYH, PMS2, POLD1, POLE, PTEN, SCG5/GREM1, SMAD4, STK11, and TP53. The report date is 06/22/2016 for the [facility] panel and 06/29/2016 for the [place] inversion.       HISTORY OF PRESENTING ILLNESS (04/27/2016):  Steven Barnett 66 y.o. male is here because of his recently diagnosed stage III colon cancer. She is accompanied by her wife to my clinic today.  This was discovered by screening colonoscopy in 03/2016. At the time of his screening colonoscopy, he was found to have anemia and iron deficiency. He has had mild to moderate fatigue and dyspnea on exertion for several months. Colonoscopy showed a large no obstructive mass in the cecum, biopsy showed adenocarcinoma. He was referred to see surgeon Dr. [name] and underwent right hemicolectomy on 04/12/2016. He has been recovering slowly from surgery, still has mild to moderate pain at the incision, his energy level and appetite has not recovered well.   He was hospitalized for CHF in 09/2015 and was found to have pleural effusion, he is on lasix. He is able to do all his ADLs, and light activities, such as house work. He is not very physically active.    CURRENT THERAPY: Ajuvant chemotherapy FOLFOX, every 2 weeks, starting 06/02/2016  INTERIM HISTORY:  Mr. Cayson returns for follow-up and his 4th cycle chemo. He has had some nasal discharge and congestion for the past few  days, no fever, or dyspnea. (+) cold sensitivity but no numbness or tingling. He denies pain, or significant nausea, his appetite is still low after chemotherapy, he has lost about 7 pounds in the past 2 months. No other complaints. His wife noticed some memory loss, he is able to function well at home.   MEDICAL HISTORY:  Past Medical History:  Diagnosis Date  . Anemia   . Arthralgia of both knees    . Arthritis    "right knee" (10/01/2015)  . Atrial fibrillation ([place])   . Cancer ([facility])    STAGE 1 COLON CANCER  . Chronic anticoagulation 2010   Coumadin  . Chronic diastolic CHF (congestive heart failure), NYHA class 2 ([place])   . Hypertension   . OSA (obstructive sleep apnea) 2016   "couldn't take the mask during the testing" (10/01/2015)  . Permanent atrial fibrillation ([place]) 2010  . Pneumonia 3/29/2017and june 2017    SURGICAL HISTORY: Past Surgical History:  Procedure Laterality Date  . COLONOSCOPY WITH PROPOFOL N/A 03/04/2016   Procedure: COLONOSCOPY WITH PROPOFOL;  Surgeon: Carol Ada, MD;  Location: [facility];  Service: Endoscopy;  Laterality: N/A;  . LAPAROSCOPIC PARTIAL COLECTOMY N/A 04/12/2016   Procedure: LAPAROSCOPIC ILEOCOLECTOMY;  Surgeon: [name], MD;  Location: [place];  Service: General;  Laterality: N/A;  . PORTACATH PLACEMENT N/A 05/18/2016   Procedure: INSERTION PORT-A-CATH;  Surgeon: [name], MD;  Location: [place];  Service: General;  Laterality: N/A;  . THORACOTOMY Right 2010    SOCIAL HISTORY: Social History   Social History  . Marital status: Married    Spouse name: Ronnette Juniper  . Number of children: N/A  . Years of education: N/A   Occupational History  . Not on file.   Social History Main Topics  . Smoking status: Former Smoker    Packs/day: 0.10    Years: 2.00    Types: Cigarettes    Quit date: 12/16/1974  . Smokeless tobacco: Never Used     Comment: "1 pack cigarettes would last me a month"  . Alcohol use 0.0 oz/week     Comment: 10/01/2015 "might have1 6 pack of beer/year"  . Drug use:     Types: Cocaine, Marijuana     Comment: Hx polysubstance abuse, quit 2008  . Sexual activity: Not Currently   Other Topics Concern  . 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   FAMILY HISTORY: Family History  Problem Relation Age of  Onset  . Heart disease Father   . Heart failure Father   . Colon polyps Father     unspecified number - pt of intestine surgically resected  . Hypertension Mother   . Diabetes Mother   . Hyperlipidemia Mother   . Colon polyps Mother     unspecified number - pt of intestine surgically resected  . Dementia Mother     d. 80  . Stroke Maternal Grandmother   . Stroke Sister   . Colon cancer Sister 74    w/ "cancerous polyps" - unspecified number; underwent surgery and radiation  . Colon polyps Brother     unspecified number - polypectomies  . Colon cancer Sister     dx 62-60; s/p surgery and radiation  . Bone cancer Maternal Uncle     dx. 49s  . Lung cancer Paternal Grandfather     d. 80s-90s; lung cancer vs TB  .  Colon cancer Sister     dx. 76-60; s/p surgery and radiation  . Cancer Maternal Uncle     mother had about 7-8 other siblings, most passed at older ages, many of whom had some type of cancer  . Heart attack Neg Hx     ALLERGIES:  is allergic to no known allergies.  MEDICATIONS:  Current Outpatient Prescriptions  Medication Sig Dispense Refill  . acetaminophen (TYLENOL) 500 MG tablet Take 1,000 mg by mouth 2 (two) times daily.    Marland Kitchen albuterol (PROVENTIL HFA;VENTOLIN HFA) 108 (90 Base) MCG/ACT inhaler Inhale 2 puffs into the lungs every 6 (six) hours as needed for wheezing or shortness of breath. 1 Inhaler 2  . aspirin EC 81 MG tablet Take 81 mg by mouth daily.    Marland Kitchen diltiazem (CARDIZEM) 120 MG tablet Take 120 mg by mouth daily.    Marland Kitchen docusate sodium (COLACE) 100 MG capsule Take 1 capsule (100 mg total) by mouth 2 (two) times daily. For constipation 60 capsule 3  . ferrous sulfate 325 (65 FE) MG tablet Take 1 tablet (325 mg total) by mouth 2 (two) times daily with a meal. 60 tablet 3  . furosemide (LASIX) 80 MG tablet TAKE ONE TABLET BY MOUTH TWICE DAILY 60 tablet 10  . guaiFENesin (MUCINEX) 600 MG 12 hr tablet Take 1,200 mg by mouth 2 (two) times daily.    Marland Kitchen  guaiFENesin-dextromethorphan (ROBITUSSIN DM) 100-10 MG/5ML syrup Take 5 mLs by mouth every 4 (four) hours as needed for cough. (Patient not taking: Reported on 06/16/2016) 118 mL 0  . hydrALAZINE (APRESOLINE) 50 MG tablet TAKE ONE TABLET BY MOUTH TWICE DAILY 60 tablet 6  . ipratropium (ATROVENT HFA) 17 MCG/ACT inhaler Inhale 2 puffs into the lungs as needed for wheezing.    . lidocaine-prilocaine (EMLA) cream Apply to affected area once 30 g 3  . lisinopril (PRINIVIL,ZESTRIL) 20 MG tablet TAKE ONE TABLET BY MOUTH ONCE DAILY 30 tablet 10  . LORazepam (ATIVAN) 0.5 MG tablet Take 1 tablet (0.5 mg total) by mouth every 6 (six) hours as needed (Nausea or vomiting). 30 tablet 0  . metoprolol tartrate (LOPRESSOR) 25 MG tablet TAKE ONE TABLET BY MOUTH TWICE DAILY 30 tablet 6  . ondansetron (ZOFRAN) 8 MG tablet Take 1 tablet (8 mg total) by mouth 2 (two) times daily as needed for refractory nausea / vomiting. Start on day 3 after chemotherapy. 30 tablet 1  . oxyCODONE (OXY IR/ROXICODONE) 5 MG immediate release tablet Take 1-2 tablets (5-10 mg total) by mouth every 6 (six) hours as needed for moderate pain, severe pain or breakthrough pain. (Patient not taking: Reported on 06/16/2016) 10 tablet 0  . potassium chloride SA (K-DUR,KLOR-CON) 20 MEQ tablet Take 2 tablets (40 mEq total) by mouth daily. 180 tablet 3  . prochlorperazine (COMPAZINE) 10 MG tablet Take 1 tablet (10 mg total) by mouth every 6 (six) hours as needed (Nausea or vomiting). 30 tablet 1  . sildenafil (REVATIO) 20 MG tablet TAKE TWO TO THREE TABLETS BY MOUTH ONCE DAILY AS NEEDED FOR SEXUAL ACTIVITY 30 tablet 2  . warfarin (COUMADIN) 4 MG tablet TAKE TWO TABLETS BY MOUTH ONCE DAILY OR  AS  DIRECTED  BY  THE  COUMADIN  CLINIC 60 tablet 0   No current facility-administered medications for this visit.     REVIEW OF SYSTEMS: Constitutional: Denies fevers, chills or abnormal night sweats, (+) fatigue  Eyes: Denies blurriness of vision, double  vision or watery eyes Ears, nose,  mouth, throat, and face: Denies mucositis or sore throat Respiratory: Denies cough, dyspnea or wheezes Cardiovascular: Denies palpitation, chest discomfort or lower extremity swelling, Gastrointestinal:  Denies nausea, heartburn or change in bowel habits Skin: Denies abnormal skin rashes Lymphatics: Denies new lymphadenopathy or easy bruising Neurological:Denies numbness, tingling or new weaknesses Behavioral/Psych: Mood is stable, no new changes  All other systems were reviewed with the patient and are negative.  PHYSICAL EXAMINATION: ECOG PERFORMANCE STATUS: 1  Vitals:   07/14/16 1103  BP: 100/60  Pulse: (!) 56  Resp: 16  Temp: 97.4 F (36.3 C)   Filed Weights   07/14/16 1103  Weight: 255 lb 14.4 oz (116.1 kg)    GENERAL:alert, no distress and comfortable SKIN: skin color, texture, turgor are normal, no rashes or significant lesions EYES: normal, conjunctiva are pink and non-injected, sclera clear OROPHARYNX:no exudate, no erythema and lips, buccal mucosa, and tongue normal  NECK: supple, thyroid normal size, non-tender, without nodularity LYMPH:  no palpable lymphadenopathy in the cervical, axillary or inguinal LUNGS: clear to auscultation and percussion with normal breathing effort HEART: regular rate & rhythm and no murmurs and no lower extremity edema ABDOMEN:abdomen soft, non-tender and normal bowel sounds. (+) midline incision appears healing well, no discharge Musculoskeletal:no cyanosis of digits and no clubbing  PSYCH: alert & oriented x 3 with fluent speech NEURO: no focal motor/sensory deficits  LABORATORY DATA:  I have reviewed the data as listed CBC Latest Ref Rng & Units 07/14/2016 06/29/2016 06/16/2016  WBC 4.0 - 10.3 10e3/uL 3.3(L) 3.7(L) 5.2  Hemoglobin 13.0 - 17.1 g/dL 14.3 13.7 14.2  Hematocrit 38.4 - 49.9 % 44.4 43.2 43.9  Platelets 140 - 400 10e3/uL 106(L) 151 204   CMP Latest Ref Rng & Units 07/14/2016 06/29/2016  06/16/2016  Glucose 70 - 140 mg/dl 69(L) 81 88  BUN 7.0 - 26.0 mg/dL 15.9 12.5 15.5  Creatinine 0.7 - 1.3 mg/dL 0.9 1.0 1.1  Sodium 136 - 145 mEq/L 139 139 137  Potassium 3.5 - 5.1 mEq/L 4.1 4.1 4.1  Chloride 101 - 111 mmol/L - - -  CO2 22 - 29 mEq/L _0 Calcium 8.4 - 10.4 mg/dL 9.1 9.0 9.3  Total Protein 6.4 - 8.3 g/dL 7.0 7.0 7.1  Total Bilirubin 0.20 - 1.20 mg/dL 0.91 0.61 0.89  Alkaline Phos 40 - 150 U/L 150 187(H) 128  AST 5 - 34 U/L _1 ALT 0 - 55 U/L _2 CEA: 04/07/16: 2.7 04/27/2016: 2.11  PATHOLOGY  Diagnosis 04/12/2016 Colon, segmental resection for tumor, Right and Terminal Ileum HIGH GRADE ADENOCARCINOMA OF THE CECUM (3 CM), GRADE 3 THE CARCINOMA INVADES MUSCULARIS PROPRIA (PT2) ALL MARGINS OF RESECTION ARE NEGATIVE FOR TUMOR METASTATIC ADENOCARCINOMA IN TWO OF SIXTEEN LYMPH NODES (2/16) UNREMARKABLE APPENDIX Microscopic Comment COLON AND RECTUM (INCLUDING TRANS-ANAL RESECTION): Specimen: Right colon and terminal ileum Procedure: Segmental resection Tumor site: Cecum Specimen integrity: Intact Macroscopic intactness of mesorectum: Not applicable: x Complete: NA Near complete: NA Incomplete: NA Cannot be determined (specify): NA Macroscopic tumor perforation: Invasion of the muscularis propria Invasive tumor: Maximum size: 3 cm Histologic type(s): Adenocarcinoma Histologic grade and differentiation: G3 G1: well differentiated/low grade G2: moderately differentiated/low grade G3: poorly differentiated/high grade G4: undifferentiated/high grade Type of polyp in which invasive carcinoma arose: Tubular adenoma 1 of 5 Supplemental copy SUPPLEMENTAL for HOSAM, [facility] ([id number]) Microscopic Comment(continued) Microscopic extension of invasive tumor: Muscularis propria Lymph-Vascular invasion: Negative Peri-neural invasion: Negative Tumor deposit(s) (discontinuous extramural extension):  Negative Resection margins: Proximal margin:  Negative Distal margin: Negative Circumferential (radial) (posterior ascending, posterior descending; lateral and posterior mid-rectum; and entire lower 1/3 rectum):Negative Mesenteric margin (sigmoid and transverse): NA Distance closest margin (if all above margins negative): 3 cm Trans-anal resection margins only: Deep margin: NA Mucosal Margin: NA Distance closest mucosal margin (if negative): NA Treatment effect (neo-adjuvant therapy): Negative Additional polyp(s): Negative Non-neoplastic findings: Unremarkable Lymph nodes: number examined 16; number positive: 2 Pathologic Staging: pT2, pN1, Mx  MMR: (+) loss of MLH1 and PMS2  MSI-H, MLH-1 hypermethylation presents, BRAF mutation (-)  RADIOGRAPHIC STUDIES: I have personally reviewed the radiological images as listed and agreed with the findings in the report.  CT chest, abdomen and pelvis 03/15/2016 IMPRESSION: 1. Polypoid cecal mass. No findings of liver metastatic disease or other definite metastatic disease. There are some small adjacent pericecal lymph nodes which are not pathologically enlarged. 2. Adenopathy in the chest is present but was also present in 2010, albeit slightly less prominent. This may be reactive adenopathy related to the chronic exudative right pleural effusion. 3. Prominent dilatation of the esophagus, nonspecific. Causes might include achalasia, neuropathy, scleroderma, pseudoachalasia, stricture, presbyesophagus, or Chagas disease. 4. Moderate 4 chamber cardiomegaly. 5. Liver morphology is suggestive but not absolutely diagnostic for early cirrhosis. 6.  Prominent stool throughout the colon favors constipation. 7. Left iliopsoas bursitis. 8. Possible varicoceles in the scrotum bilaterally. 9. Impingement at L4-5 due to spondylosis and degenerative disc disease.   Colonoscopy 03/04/2016 Dr. [name]  A polypoid and ulcerated non-obstructing large mass was found in the cecum. The mass was  noncircumferential. The mass measured three cm in length. In addition, its diameter measured three mm. No bleeding was present. This was biopsied with a cold forceps for histology. Findings: A 2 mm polyp was found in the transverse colon. The polyp was sessile. The polyp was removed with a cold biopsy forceps. Resection and retrieval were complete. A small ascending colon polyp was identified, image #3, near the mass. This polyp was not removed as it is anticipated that it will be removed with the mass surgically. - Likely malignant tumor in the cecum. Biopsied. - One 2 mm polyp in the transverse colon, removed with a cold biopsy forceps. Resected and retrieved.   ASSESSMENT & PLAN:  66 y.o. male, with past medical history of hypertension, OSA, CHF, obesity, presented with fatigue, dyspnea on exertion, and anemia. Screening colonoscopy showed a cecal mass.  1. Right colon cancer, invasive adenocarcinoma,G3,  pT2N1M0, stage IIIA, MSI-H -I previously reviewed his CT scan findings, and surgical pathology results in great details with patient and her family members -He had a complete surgical resection -His tumor has MSI high, but ML H1 hypermethylation present, supports sporadic colon cancer, 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 -We previously discussed the risk of cancer recurrence after surgery. Due to his stage IIIA disease, he is at moderate to high risk of recurrence -We previously discussed the standard adjuvant chemotherapy for stage III colon cancer,  reduce the risk of cancer recurrence. -we previously discussed the regimens for adjuvant chemotherapy, including FOLFOX, CAPOX, or single agent Xeloda or 5-FU. Due to his comorbidities, I recommend FOLFOX for 3 months (6 cycles) -He is tolerating adjuvant chemotherapy FOLFOX well, Lab reviewed, he has developed a mild thrombocytopenia, adequate for treatment, we'll proceed to cycle 4 today -He has cold sensitivity, but no significant peripheral  neuropathy, we'll finish planned 6 cycles chemotherapy.  2. Iron deficient anemia -Secondary to his colon cancer -His anemia has  resolved -Continue oral iron pill  3. HTN, OSA, CHF, obesity -He will continue follow-up with his primary care physician -We previously discussed that chemotherapy may affect his blood pressure, and we will monitor his blood pressure closely. We will be cautious about IV fluids, given his history of CHF. -He is on lasix, which may need to be held after chemo if his oral intake decreases.  4. Cold symptoms -He has a mild nasal discharge and congestion, mild dry cough, no chest pain or shortness breast, physical exam was unremarkable. -He knows to call us if he develops fever or other new symptoms.  Plan -4nd cycle FOLFOX today. -Lab, thrush, follow-up and chemotherapy in 2 and 4 weeks   All questions were answered. The patient knows to call the clinic with any problems, questions or concerns. I spent 20 minutes counseling the patient face to face. The total time spent in the appointment was 25 minutes and more than 50% was on counseling.     [name], MD 07/14/2016

## 2016-07-14 ENCOUNTER — Telehealth: Payer: Self-pay | Admitting: Hematology

## 2016-07-14 ENCOUNTER — Other Ambulatory Visit (HOSPITAL_BASED_OUTPATIENT_CLINIC_OR_DEPARTMENT_OTHER): Payer: Medicaid Other

## 2016-07-14 ENCOUNTER — Ambulatory Visit: Payer: Medicaid Other

## 2016-07-14 ENCOUNTER — Encounter: Payer: Self-pay | Admitting: Hematology

## 2016-07-14 ENCOUNTER — Ambulatory Visit (HOSPITAL_BASED_OUTPATIENT_CLINIC_OR_DEPARTMENT_OTHER): Payer: Medicaid Other | Admitting: Hematology

## 2016-07-14 ENCOUNTER — Encounter: Payer: Self-pay | Admitting: *Deleted

## 2016-07-14 ENCOUNTER — Ambulatory Visit (HOSPITAL_BASED_OUTPATIENT_CLINIC_OR_DEPARTMENT_OTHER): Payer: Medicaid Other

## 2016-07-14 VITALS — BP 100/60 | HR 56 | Temp 97.4°F | Resp 16 | Ht 70.0 in | Wt 255.9 lb

## 2016-07-14 DIAGNOSIS — I10 Essential (primary) hypertension: Secondary | ICD-10-CM | POA: Diagnosis not present

## 2016-07-14 DIAGNOSIS — E669 Obesity, unspecified: Secondary | ICD-10-CM

## 2016-07-14 DIAGNOSIS — D509 Iron deficiency anemia, unspecified: Secondary | ICD-10-CM

## 2016-07-14 DIAGNOSIS — C182 Malignant neoplasm of ascending colon: Secondary | ICD-10-CM

## 2016-07-14 DIAGNOSIS — I2510 Atherosclerotic heart disease of native coronary artery without angina pectoris: Secondary | ICD-10-CM

## 2016-07-14 DIAGNOSIS — R05 Cough: Secondary | ICD-10-CM

## 2016-07-14 DIAGNOSIS — G4733 Obstructive sleep apnea (adult) (pediatric): Secondary | ICD-10-CM | POA: Diagnosis not present

## 2016-07-14 DIAGNOSIS — Z5111 Encounter for antineoplastic chemotherapy: Secondary | ICD-10-CM | POA: Diagnosis present

## 2016-07-14 DIAGNOSIS — I5032 Chronic diastolic (congestive) heart failure: Secondary | ICD-10-CM

## 2016-07-14 DIAGNOSIS — C180 Malignant neoplasm of cecum: Secondary | ICD-10-CM

## 2016-07-14 DIAGNOSIS — R0981 Nasal congestion: Secondary | ICD-10-CM | POA: Diagnosis not present

## 2016-07-14 LAB — CBC WITH DIFFERENTIAL/PLATELET
BASO%: 1.5 % (ref 0.0–2.0)
Basophils Absolute: 0.1 10*3/uL (ref 0.0–0.1)
EOS ABS: 0.1 10*3/uL (ref 0.0–0.5)
EOS%: 4.2 % (ref 0.0–7.0)
HEMATOCRIT: 44.4 % (ref 38.4–49.9)
HGB: 14.3 g/dL (ref 13.0–17.1)
LYMPH%: 33.1 % (ref 14.0–49.0)
MCH: 25.7 pg — AB (ref 27.2–33.4)
MCHC: 32.2 g/dL (ref 32.0–36.0)
MCV: 79.7 fL (ref 79.3–98.0)
MONO#: 0.5 10*3/uL (ref 0.1–0.9)
MONO%: 14.2 % — ABNORMAL HIGH (ref 0.0–14.0)
NEUT#: 1.6 10*3/uL (ref 1.5–6.5)
NEUT%: 47 % (ref 39.0–75.0)
PLATELETS: 106 10*3/uL — AB (ref 140–400)
RBC: 5.57 10*6/uL (ref 4.20–5.82)
RDW: 19.7 % — ABNORMAL HIGH (ref 11.0–14.6)
WBC: 3.3 10*3/uL — ABNORMAL LOW (ref 4.0–10.3)
lymph#: 1.1 10*3/uL (ref 0.9–3.3)
nRBC: 0 % (ref 0–0)

## 2016-07-14 LAB — IRON AND TIBC
%SAT: 31 % (ref 20–55)
IRON: 98 ug/dL (ref 42–163)
TIBC: 320 ug/dL (ref 202–409)
UIBC: 222 ug/dL (ref 117–376)

## 2016-07-14 LAB — COMPREHENSIVE METABOLIC PANEL
ALT: 13 U/L (ref 0–55)
ANION GAP: 7 meq/L (ref 3–11)
AST: 19 U/L (ref 5–34)
Albumin: 3.4 g/dL — ABNORMAL LOW (ref 3.5–5.0)
Alkaline Phosphatase: 150 U/L (ref 40–150)
BILIRUBIN TOTAL: 0.91 mg/dL (ref 0.20–1.20)
BUN: 15.9 mg/dL (ref 7.0–26.0)
CALCIUM: 9.1 mg/dL (ref 8.4–10.4)
CO2: 27 meq/L (ref 22–29)
Chloride: 104 mEq/L (ref 98–109)
Creatinine: 0.9 mg/dL (ref 0.7–1.3)
Glucose: 69 mg/dl — ABNORMAL LOW (ref 70–140)
Potassium: 4.1 mEq/L (ref 3.5–5.1)
Sodium: 139 mEq/L (ref 136–145)
TOTAL PROTEIN: 7 g/dL (ref 6.4–8.3)

## 2016-07-14 LAB — FERRITIN: FERRITIN: 129 ng/mL (ref 22–316)

## 2016-07-14 LAB — CEA (IN HOUSE-CHCC): CEA (CHCC-IN HOUSE): 4.25 ng/mL (ref 0.00–5.00)

## 2016-07-14 MED ORDER — FLUOROURACIL CHEMO INJECTION 2.5 GM/50ML
400.0000 mg/m2 | Freq: Once | INTRAVENOUS | Status: AC
  Administered 2016-07-14: 950 mg via INTRAVENOUS
  Filled 2016-07-14: qty 19

## 2016-07-14 MED ORDER — DEXAMETHASONE SODIUM PHOSPHATE 10 MG/ML IJ SOLN
10.0000 mg | Freq: Once | INTRAMUSCULAR | Status: AC
  Administered 2016-07-14: 10 mg via INTRAVENOUS

## 2016-07-14 MED ORDER — SODIUM CHLORIDE 0.9 % IV SOLN
2400.0000 mg/m2 | INTRAVENOUS | Status: DC
  Administered 2016-07-14: 5700 mg via INTRAVENOUS
  Filled 2016-07-14: qty 114

## 2016-07-14 MED ORDER — DEXTROSE 5 % IV SOLN
Freq: Once | INTRAVENOUS | Status: AC
  Administered 2016-07-14: 11:00:00 via INTRAVENOUS

## 2016-07-14 MED ORDER — PALONOSETRON HCL INJECTION 0.25 MG/5ML
0.2500 mg | Freq: Once | INTRAVENOUS | Status: AC
  Administered 2016-07-14: 0.25 mg via INTRAVENOUS

## 2016-07-14 MED ORDER — DEXTROSE 5 % IV SOLN
85.0000 mg/m2 | Freq: Once | INTRAVENOUS | Status: AC
  Administered 2016-07-14: 200 mg via INTRAVENOUS
  Filled 2016-07-14: qty 40

## 2016-07-14 MED ORDER — DEXAMETHASONE SODIUM PHOSPHATE 10 MG/ML IJ SOLN
INTRAMUSCULAR | Status: AC
  Filled 2016-07-14: qty 1

## 2016-07-14 MED ORDER — LEUCOVORIN CALCIUM INJECTION 350 MG
400.0000 mg/m2 | Freq: Once | INTRAVENOUS | Status: AC
  Administered 2016-07-14: 948 mg via INTRAVENOUS
  Filled 2016-07-14: qty 47.4

## 2016-07-14 MED ORDER — PALONOSETRON HCL INJECTION 0.25 MG/5ML
INTRAVENOUS | Status: AC
  Filled 2016-07-14: qty 5

## 2016-07-14 NOTE — Telephone Encounter (Signed)
Gave patient avs report and appointments for January and February  °

## 2016-07-14 NOTE — Patient Instructions (Signed)

## 2016-07-14 NOTE — Progress Notes (Signed)
Oncology Nurse Navigator Documentation  Oncology Nurse Navigator Flowsheets 07/14/2016  Navigator Location [facility]  Referral date to RadOnc/MedOnc -  Navigator Encounter Type Treatment  Telephone -  Abnormal Finding Date -  Confirmed Diagnosis Date -  Surgery Date -  Multidisiplinary Clinic Date -  Multidisiplinary Clinic Type -  Treatment Initiated Date -  Patient Visit Type MedOnc  Treatment Phase Active Tx--FOLFOX # 4  Barriers/Navigation Needs No barriers at this time;No Questions;No Needs  Education -  Interventions None required  Education Method -  Support Groups/Services -  Acuity -  Time Spent with Patient 15

## 2016-07-14 NOTE — Patient Instructions (Addendum)
Return to [facility] @ 1:30 pm on Saturday, 07/16/2016 for pump disconnect.  [facility] Discharge Instructions for Patients Receiving Chemotherapy  Today you received the following chemotherapy agents Oxaliplatin, Leucovorin and Adrucil.  To help prevent nausea and vomiting after your treatment, we encourage you to take your nausea medication as directed. NO ZOFRAN FOR 3 DAYS.   If you develop nausea and vomiting that is not controlled by your nausea medication, call the clinic.   BELOW ARE SYMPTOMS THAT SHOULD BE REPORTED IMMEDIATELY:  *FEVER GREATER THAN 100.5 F  *CHILLS WITH OR WITHOUT FEVER  NAUSEA AND VOMITING THAT IS NOT CONTROLLED WITH YOUR NAUSEA MEDICATION  *UNUSUAL SHORTNESS OF BREATH  *UNUSUAL BRUISING OR BLEEDING  TENDERNESS IN MOUTH AND THROAT WITH OR WITHOUT PRESENCE OF ULCERS  *URINARY PROBLEMS  *BOWEL PROBLEMS  UNUSUAL RASH Items with * indicate a potential emergency and should be followed up as soon as possible.  Feel free to call the clinic you have any questions or concerns. The clinic phone number is [phone].  Please show the [facility] at check-in to the Emergency Department and triage nurse.

## 2016-07-16 ENCOUNTER — Ambulatory Visit (HOSPITAL_BASED_OUTPATIENT_CLINIC_OR_DEPARTMENT_OTHER): Payer: Medicaid Other

## 2016-07-16 VITALS — BP 179/114 | HR 89 | Temp 98.0°F | Resp 18

## 2016-07-16 DIAGNOSIS — Z452 Encounter for adjustment and management of vascular access device: Secondary | ICD-10-CM | POA: Diagnosis not present

## 2016-07-16 DIAGNOSIS — C182 Malignant neoplasm of ascending colon: Secondary | ICD-10-CM | POA: Diagnosis not present

## 2016-07-16 MED ORDER — SODIUM CHLORIDE 0.9% FLUSH
10.0000 mL | INTRAVENOUS | Status: DC | PRN
  Administered 2016-07-16: 10 mL
  Filled 2016-07-16: qty 10

## 2016-07-16 MED ORDER — HEPARIN SOD (PORK) LOCK FLUSH 100 UNIT/ML IV SOLN
500.0000 [IU] | Freq: Once | INTRAVENOUS | Status: AC | PRN
  Administered 2016-07-16: 500 [IU]
  Filled 2016-07-16: qty 5

## 2016-07-16 NOTE — Progress Notes (Signed)
Discussed hypertension with pt. He missed last night and this morning dose of BP meds. 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 He was instructed to take BP meds as soon as he gets home. He agreed with this and stated he was also going to get some sleep.

## 2016-07-26 NOTE — Progress Notes (Signed)
[place]  [phone] [phone]  Clinic Follow up Note   Patient Care Team: Elisabeth Cara, PA-C as PCP - General (Family Medicine) [name], MD as Consulting Physician (General Surgery) Tania Ade, RN as Registered Nurse 07/28/2016   CHIEF COMPLAINTS:  Stage III colon cancer  Oncology History   Cancer of right colon [facility])   Staging form: Colon and Rectum, AJCC 7th Edition   - Clinical stage from 04/12/2016: Stage IIIA (T2, N1, M0) - Signed by [name], MD on 05/01/2016      Cancer of right colon ([place])   03/04/2016 Procedure    COLONOSCOPY: A polypoid and ulcerated non-obstructing large mass was found in the cecum. The mass was noncircumferential. The mass measured three cm in length. In addition, its diameter measured three mm. (Dr. [name])       03/15/2016 Imaging    1. Polypoid cecal mass. No findings of liver metastatic disease or other definite metastatic disease. There are some small adjacent pericecal lymph nodes which are not pathologically enlarged. 2. Adenopathy in the chest is present but was also present in 2010, albeit slightly less prominent. This may be reactive adenopathy related to the chronic exudative right pleural effusion.       04/07/2016 Tumor Marker    Patient's tumor was tested for the following markers: CEA. Results of the tumor marker test revealed 2.7.      04/12/2016 Initial Diagnosis    Cecal cancer ([place])     04/12/2016 Definitive Surgery    Laparoscopic right hemicolectomy (Dr. [name])      04/12/2016 Pathologic Stage    pT2 pN1 pMX--Grade 3 adenocarcinoma with 2/16 nodes positive, clear margins, negative for perineural or lymphvascular invasion; invades muscularis propria Loss of expression MLH1/PMS2      06/02/2016 -  Adjuvant Chemotherapy     Ajuvant chemotherapy FOLFOX, every 2 weeks, plan for 3 months       06/29/2016 Genetic Testing    POLE [id number] [id number] identified on the  Colorectal cancer panel.  Negative genetic testing for the MSH2 inversion analysis ([place] inversion). The Colorectal Cancer Panel offered by [facility] includes sequencing and/or duplication/deletion testing of the following 19 genes: APC, ATM, AXIN2, BMPR1A, CDH1, CHEK2, EPCAM, MLH1, MSH2, MSH6, MUTYH, PMS2, POLD1, POLE, PTEN, SCG5/GREM1, SMAD4, STK11, and TP53. The report date is 06/22/2016 for the [facility] panel and 06/29/2016 for the [place] inversion.       HISTORY OF PRESENTING ILLNESS (04/27/2016):  Steven Barnett 66 y.o. male is here because of his recently diagnosed stage III colon cancer. She is accompanied by her wife to my clinic today.  This was discovered by screening colonoscopy in 03/2016. At the time of his screening colonoscopy, he was found to have anemia and iron deficiency. He has had mild to moderate fatigue and dyspnea on exertion for several months. Colonoscopy showed a large no obstructive mass in the cecum, biopsy showed adenocarcinoma. He was referred to see surgeon Dr. [name] and underwent right hemicolectomy on 04/12/2016. He has been recovering slowly from surgery, still has mild to moderate pain at the incision, his energy level and appetite has not recovered well.   He was hospitalized for CHF in 09/2015 and was found to have pleural effusion, he is on lasix. He is able to do all his ADLs, and light activities, such as house work. He is not very physically active.    CURRENT THERAPY: Ajuvant chemotherapy FOLFOX, every 2 weeks, starting 06/02/2016  INTERIM HISTORY:  Mr. Gilbo returns for follow-up and his 5th cycle chemo. He has one cycle of treatment left. He did well after his last cycle. He is very tired. He doesn't sleep well at night, but sleeps all morning. His right hand has been cramping. It is relieved with tylenol. Denies numbness or tingling other than when he is cold. His vision is getting blurry. He got some glasses for when he wants to read. He can watch TV without  them. Denies any other concerns.   MEDICAL HISTORY:  Past Medical History:  Diagnosis Date  . Anemia   . Arthralgia of both knees   . Arthritis    "right knee" (10/01/2015)  . Atrial fibrillation ([place])   . Cancer (HCC)    STAGE 1 COLON CANCER  . Chronic anticoagulation 2010   Coumadin  . Chronic diastolic CHF (congestive heart failure), NYHA class 2 ([place])   . Hypertension   . OSA (obstructive sleep apnea) 2016   "couldn't take the mask during the testing" (10/01/2015)  . Permanent atrial fibrillation ([place]) 2010  . Pneumonia 3/29/2017and june 2017    SURGICAL HISTORY: Past Surgical History:  Procedure Laterality Date  . COLONOSCOPY WITH PROPOFOL N/A 03/04/2016   Procedure: COLONOSCOPY WITH PROPOFOL;  Surgeon: Carol Ada, MD;  Location: [facility];  Service: Endoscopy;  Laterality: N/A;  . LAPAROSCOPIC PARTIAL COLECTOMY N/A 04/12/2016   Procedure: LAPAROSCOPIC ILEOCOLECTOMY;  Surgeon: [name], MD;  Location: [place];  Service: General;  Laterality: N/A;  . PORTACATH PLACEMENT N/A 05/18/2016   Procedure: INSERTION PORT-A-CATH;  Surgeon: [name], MD;  Location: [place];  Service: General;  Laterality: N/A;  . THORACOTOMY Right 2010    SOCIAL HISTORY: Social History   Social History  . Marital status: Married    Spouse name: Ronnette Juniper  . Number of children: N/A  . Years of education: N/A   Occupational History  . Not on file.   Social History Main Topics  . Smoking status: Former Smoker    Packs/day: 0.10    Years: 2.00    Types: Cigarettes    Quit date: 12/16/1974  . Smokeless tobacco: Never Used     Comment: "1 pack cigarettes would last me a month"  . Alcohol use 0.0 oz/week     Comment: 10/01/2015 "might have1 6 pack of beer/year"  . Drug use: Yes    Types: Cocaine, Marijuana     Comment: Hx polysubstance abuse, quit 2008  . Sexual activity: Not Currently   Other Topics Concern  . 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   FAMILY HISTORY: Family History  Problem Relation Age of Onset  . Heart disease Father   . Heart failure Father   . Colon polyps Father     unspecified number - pt of intestine surgically resected  . Hypertension Mother   . Diabetes Mother   . Hyperlipidemia Mother   . Colon polyps Mother     unspecified number - pt of intestine surgically resected  . Dementia Mother     d. 6  . Stroke Maternal Grandmother   . Stroke Sister   . Colon cancer Sister 28    w/ "cancerous polyps" - unspecified number; underwent surgery and radiation  . Colon polyps Brother     unspecified number - polypectomies  . Colon cancer Sister     dx  50-60; s/p surgery and radiation  . Bone cancer Maternal Uncle     dx. 36s  . Lung cancer Paternal Grandfather     d. 80s-90s; lung cancer vs TB  . Colon cancer Sister     dx. 66-60; s/p surgery and radiation  . Cancer Maternal Uncle     mother had about 7-8 other siblings, most passed at older ages, many of whom had some type of cancer  . Heart attack Neg Hx     ALLERGIES:  is allergic to no known allergies.  MEDICATIONS:  Current Outpatient Prescriptions  Medication Sig Dispense Refill  . acetaminophen (TYLENOL) 500 MG tablet Take 1,000 mg by mouth 2 (two) times daily.    Marland Kitchen albuterol (PROVENTIL HFA;VENTOLIN HFA) 108 (90 Base) MCG/ACT inhaler Inhale 2 puffs into the lungs every 6 (six) hours as needed for wheezing or shortness of breath. 1 Inhaler 2  . aspirin EC 81 MG tablet Take 81 mg by mouth daily.    Marland Kitchen diltiazem (CARDIZEM) 120 MG tablet Take 120 mg by mouth daily.    Marland Kitchen docusate sodium (COLACE) 100 MG capsule Take 1 capsule (100 mg total) by mouth 2 (two) times daily. For constipation 60 capsule 3  . ferrous sulfate 325 (65 FE) MG tablet Take 1 tablet (325 mg total) by mouth 2 (two) times daily with a meal. 60 tablet 3  . furosemide (LASIX) 80 MG tablet TAKE ONE TABLET BY  MOUTH TWICE DAILY 60 tablet 10  . guaiFENesin (MUCINEX) 600 MG 12 hr tablet Take 1,200 mg by mouth 2 (two) times daily.    . hydrALAZINE (APRESOLINE) 50 MG tablet TAKE ONE TABLET BY MOUTH TWICE DAILY 60 tablet 6  . lidocaine-prilocaine (EMLA) cream Apply to affected area once 30 g 3  . lisinopril (PRINIVIL,ZESTRIL) 20 MG tablet TAKE ONE TABLET BY MOUTH ONCE DAILY 30 tablet 10  . LORazepam (ATIVAN) 0.5 MG tablet Take 1 tablet (0.5 mg total) by mouth every 6 (six) hours as needed (Nausea or vomiting). 30 tablet 0  . metoprolol tartrate (LOPRESSOR) 25 MG tablet TAKE ONE TABLET BY MOUTH TWICE DAILY 30 tablet 6  . potassium chloride SA (K-DUR,KLOR-CON) 20 MEQ tablet Take 2 tablets (40 mEq total) by mouth daily. 180 tablet 3  . sildenafil (REVATIO) 20 MG tablet TAKE TWO TO THREE TABLETS BY MOUTH ONCE DAILY AS NEEDED FOR SEXUAL ACTIVITY 30 tablet 2  . warfarin (COUMADIN) 4 MG tablet TAKE TWO TABLETS BY MOUTH ONCE DAILY OR  AS  DIRECTED  BY  THE  COUMADIN  CLINIC 60 tablet 0  . guaiFENesin-dextromethorphan (ROBITUSSIN DM) 100-10 MG/5ML syrup Take 5 mLs by mouth every 4 (four) hours as needed for cough. (Patient not taking: Reported on 06/16/2016) 118 mL 0  . ipratropium (ATROVENT HFA) 17 MCG/ACT inhaler Inhale 2 puffs into the lungs as needed for wheezing.    . ondansetron (ZOFRAN) 8 MG tablet Take 1 tablet (8 mg total) by mouth 2 (two) times daily as needed for refractory nausea / vomiting. Start on day 3 after chemotherapy. (Patient not taking: Reported on 07/28/2016) 30 tablet 1  . oxyCODONE (OXY IR/ROXICODONE) 5 MG immediate release tablet Take 1-2 tablets (5-10 mg total) by mouth every 6 (six) hours as needed for moderate pain, severe pain or breakthrough pain. (Patient not taking: Reported on 07/28/2016) 10 tablet 0  . prochlorperazine (COMPAZINE) 10 MG tablet Take 1 tablet (10 mg total) by mouth every 6 (six) hours as needed (Nausea or vomiting). (Patient not  taking: Reported on 07/28/2016) 30 tablet 1    No current facility-administered medications for this visit.     REVIEW OF SYSTEMS: Constitutional: Denies fevers, chills or abnormal night sweats, (+) fatigue  Eyes: Denies double vision or watery eyes (+) blurry vision Ears, nose, mouth, throat, and face: Denies mucositis or sore throat Respiratory: Denies cough, dyspnea or wheezes Cardiovascular: Denies palpitation, chest discomfort or lower extremity swelling, Gastrointestinal:  Denies nausea, heartburn or change in bowel habits Skin: Denies abnormal skin rashes Lymphatics: Denies new lymphadenopathy or easy bruising Musculoskeletal: (+) cramping right hand Neurological:Denies numbness, tingling or new weaknesses Behavioral/Psych: Mood is stable, no new changes (+) insomnia  All other systems were reviewed with the patient and are negative.  PHYSICAL EXAMINATION: ECOG PERFORMANCE STATUS: 1  Vitals:   07/28/16 1010  BP: 117/75  Pulse: 93  Resp: 18  Temp: 97.5 F (36.4 C)   Filed Weights   07/28/16 1010  Weight: 258 lb 1.6 oz (117.1 kg)   GENERAL:alert, no distress and comfortable SKIN: skin color, texture, turgor are normal, no rashes or significant lesions EYES: normal, conjunctiva are pink and non-injected, sclera clear OROPHARYNX:no exudate, no erythema and lips, buccal mucosa, and tongue normal  NECK: supple, thyroid normal size, non-tender, without nodularity LYMPH:  no palpable lymphadenopathy in the cervical, axillary or inguinal LUNGS: clear to auscultation and percussion with normal breathing effort HEART: regular rate & rhythm and no murmurs and no lower extremity edema ABDOMEN:abdomen soft, non-tender and normal bowel sounds. (+) midline incision appears healing well, no discharge Musculoskeletal:no cyanosis of digits and no clubbing  PSYCH: alert & oriented x 3 with fluent speech NEURO: no focal motor/sensory deficits  LABORATORY DATA:  I have reviewed the data as listed CBC Latest Ref Rng & Units  07/28/2016 07/14/2016 06/29/2016  WBC 4.0 - 10.3 10e3/uL 3.8(L) 3.3(L) 3.7(L)  Hemoglobin 13.0 - 17.1 g/dL 14.9 14.3 13.7  Hematocrit 38.4 - 49.9 % 45.4 44.4 43.2  Platelets 140 - 400 10e3/uL 94(L) 106(L) 151   CMP Latest Ref Rng & Units 07/28/2016 07/14/2016 06/29/2016  Glucose 70 - 140 mg/dl 89 69(L) 81  BUN 7.0 - 26.0 mg/dL 12.4 15.9 12.5  Creatinine 0.7 - 1.3 mg/dL 0.9 0.9 1.0  Sodium 136 - 145 mEq/L 137 139 139  Potassium 3.5 - 5.1 mEq/L 3.7 4.1 4.1  Chloride 101 - 111 mmol/L - - -  CO2 22 - 29 mEq/L 26 27 29   Calcium 8.4 - 10.4 mg/dL 9.5 9.1 9.0  Total Protein 6.4 - 8.3 g/dL 6.9 7.0 7.0  Total Bilirubin 0.20 - 1.20 mg/dL 1.36(H) 0.91 0.61  Alkaline Phos 40 - 150 U/L 126 150 187(H)  AST 5 - 34 U/L 25 19 21   ALT 0 - 55 U/L 18 13 16    CEA: 04/07/16: 2.7 04/27/2016: 2.11 07/14/2016: 4.25  PATHOLOGY  Diagnosis 04/12/2016 Colon, segmental resection for tumor, Right and Terminal Ileum HIGH GRADE ADENOCARCINOMA OF THE CECUM (3 CM), GRADE 3 THE CARCINOMA INVADES MUSCULARIS PROPRIA (PT2) ALL MARGINS OF RESECTION ARE NEGATIVE FOR TUMOR METASTATIC ADENOCARCINOMA IN TWO OF SIXTEEN LYMPH NODES (2/16) UNREMARKABLE APPENDIX Microscopic Comment COLON AND RECTUM (INCLUDING TRANS-ANAL RESECTION): Specimen: Right colon and terminal ileum Procedure: Segmental resection Tumor site: Cecum Specimen integrity: Intact Macroscopic intactness of mesorectum: Not applicable: x Complete: NA Near complete: NA Incomplete: NA Cannot be determined (specify): NA Macroscopic tumor perforation: Invasion of the muscularis propria Invasive tumor: Maximum size: 3 cm Histologic type(s): Adenocarcinoma Histologic grade and differentiation: G3 G1: well  differentiated/low grade G2: moderately differentiated/low grade G3: poorly differentiated/high grade G4: undifferentiated/high grade Type of polyp in which invasive carcinoma arose: Tubular adenoma 1 of 5 Supplemental copy SUPPLEMENTAL for EASTIN, SWING ([id number]) Microscopic Comment(continued) Microscopic extension of invasive tumor: Muscularis propria Lymph-Vascular invasion: Negative Peri-neural invasion: Negative Tumor deposit(s) (discontinuous extramural extension): Negative Resection margins: Proximal margin: Negative Distal margin: Negative Circumferential (radial) (posterior ascending, posterior descending; lateral and posterior mid-rectum; and entire lower 1/3 rectum):Negative Mesenteric margin (sigmoid and transverse): NA Distance closest margin (if all above margins negative): 3 cm Trans-anal resection margins only: Deep margin: NA Mucosal Margin: NA Distance closest mucosal margin (if negative): NA Treatment effect (neo-adjuvant therapy): Negative Additional polyp(s): Negative Non-neoplastic findings: Unremarkable Lymph nodes: number examined 16; number positive: 2 Pathologic Staging: pT2, pN1, Mx  MMR: (+) loss of MLH1 and PMS2  MSI-H, MLH-1 hypermethylation presents, BRAF mutation (-)  RADIOGRAPHIC STUDIES: I have personally reviewed the radiological images as listed and agreed with the findings in the report.  CT chest, abdomen and pelvis 03/15/2016 IMPRESSION: 1. Polypoid cecal mass. No findings of liver metastatic disease or other definite metastatic disease. There are some small adjacent pericecal lymph nodes which are not pathologically enlarged. 2. Adenopathy in the chest is present but was also present in 2010, albeit slightly less prominent. This may be reactive adenopathy related to the chronic exudative right pleural effusion. 3. Prominent dilatation of the esophagus, nonspecific. Causes might include achalasia, neuropathy, scleroderma, pseudoachalasia, stricture, presbyesophagus, or Chagas disease. 4. Moderate 4 chamber cardiomegaly. 5. Liver morphology is suggestive but not absolutely diagnostic for early cirrhosis. 6.  Prominent stool throughout the colon favors constipation. 7. Left  iliopsoas bursitis. 8. Possible varicoceles in the scrotum bilaterally. 9. Impingement at L4-5 due to spondylosis and degenerative disc disease.   Colonoscopy 03/04/2016 Dr. [name]  A polypoid and ulcerated non-obstructing large mass was found in the cecum. The mass was noncircumferential. The mass measured three cm in length. In addition, its diameter measured three mm. No bleeding was present. This was biopsied with a cold forceps for histology. Findings: A 2 mm polyp was found in the transverse colon. The polyp was sessile. The polyp was removed with a cold biopsy forceps. Resection and retrieval were complete. A small ascending colon polyp was identified, image #3, near the mass. This polyp was not removed as it is anticipated that it will be removed with the mass surgically. - Likely malignant tumor in the cecum. Biopsied. - One 2 mm polyp in the transverse colon, removed with a cold biopsy forceps. Resected and retrieved.   ASSESSMENT & PLAN:  66 y.o. male, with past medical history of hypertension, OSA, CHF, obesity, presented with fatigue, dyspnea on exertion, and anemia. Screening colonoscopy showed a cecal mass.  1. Right colon cancer, invasive adenocarcinoma,G3,  pT2N1M0, stage IIIA, MSI-H -I previously reviewed his CT scan findings, and surgical pathology results in great details with patient and her family members -He had a complete surgical resection -His tumor has MSI high, but ML H1 hypermethylation present, supports sporadic colon cancer, 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 -We previously discussed the risk of cancer recurrence after surgery. Due to his stage IIIA disease, he is at moderate to high risk of recurrence -We previously discussed the standard adjuvant chemotherapy for stage III colon cancer,  reduce the risk of cancer recurrence. -we previously discussed the regimens for adjuvant chemotherapy, including FOLFOX, CAPOX, or single agent Xeloda or 5-FU. Due to his  comorbidities, I recommend FOLFOX for 3 months (  6 cycles) -He is tolerating adjuvant chemotherapy FOLFOX well, Lab reviewed, he has developed a mild thrombocytopenia, adequate for treatment, we'll proceed to cycle 5 today -He has cold sensitivity, but no significant peripheral neuropathy, we'll finish planned 6 cycles chemotherapy.  2. Iron deficient anemia -Secondary to his colon cancer -His anemia has resolved -Continue oral iron pill  3. HTN, OSA, CHF, obesity -He will continue follow-up with his primary care physician -We previously discussed that chemotherapy may affect his blood pressure, and we will monitor his blood pressure closely. We will be cautious about IV fluids, given his history of CHF. -He is on lasix, which may need to be held after chemo if his oral intake decreases.  4. Insomnia -Recommended Melatonin.   Plan -5th cycle FOLFOX today. -Lab, thrush, follow-up and chemotherapy in 2 weeks (last cycle)   All questions were answered. The patient knows to call the clinic with any problems, questions or concerns. I spent 20 minutes counseling the patient face to face. The total time spent in the appointment was 25 minutes and more than 50% was on counseling.  This document serves as a record of services personally performed by [name], MD. It was created on her behalf by Martinique Casey, a trained medical scribe. The creation of this record is based on the scribe's personal observations and the provider's statements to them. This document has been checked and approved by the attending provider.  I have reviewed the above documentation for accuracy and completeness, and I agree with the above information.       [name], MD 07/28/2016

## 2016-07-27 ENCOUNTER — Ambulatory Visit (INDEPENDENT_AMBULATORY_CARE_PROVIDER_SITE_OTHER): Payer: Medicaid Other | Admitting: Pharmacist

## 2016-07-27 DIAGNOSIS — I482 Chronic atrial fibrillation: Secondary | ICD-10-CM | POA: Diagnosis not present

## 2016-07-27 DIAGNOSIS — Z7901 Long term (current) use of anticoagulants: Secondary | ICD-10-CM

## 2016-07-27 DIAGNOSIS — I4821 Permanent atrial fibrillation: Secondary | ICD-10-CM

## 2016-07-27 LAB — POCT INR: INR: 5.6

## 2016-07-28 ENCOUNTER — Other Ambulatory Visit (HOSPITAL_BASED_OUTPATIENT_CLINIC_OR_DEPARTMENT_OTHER): Payer: Medicaid Other

## 2016-07-28 ENCOUNTER — Ambulatory Visit (HOSPITAL_BASED_OUTPATIENT_CLINIC_OR_DEPARTMENT_OTHER): Payer: Medicaid Other | Admitting: Hematology

## 2016-07-28 ENCOUNTER — Ambulatory Visit: Payer: Medicaid Other

## 2016-07-28 ENCOUNTER — Ambulatory Visit (HOSPITAL_BASED_OUTPATIENT_CLINIC_OR_DEPARTMENT_OTHER): Payer: Medicaid Other

## 2016-07-28 ENCOUNTER — Encounter: Payer: Self-pay | Admitting: Hematology

## 2016-07-28 VITALS — BP 117/75 | HR 93 | Temp 97.5°F | Resp 18 | Ht 70.0 in | Wt 258.1 lb

## 2016-07-28 DIAGNOSIS — C182 Malignant neoplasm of ascending colon: Secondary | ICD-10-CM

## 2016-07-28 DIAGNOSIS — Z5111 Encounter for antineoplastic chemotherapy: Secondary | ICD-10-CM

## 2016-07-28 DIAGNOSIS — C180 Malignant neoplasm of cecum: Secondary | ICD-10-CM

## 2016-07-28 DIAGNOSIS — I10 Essential (primary) hypertension: Secondary | ICD-10-CM | POA: Diagnosis not present

## 2016-07-28 DIAGNOSIS — D696 Thrombocytopenia, unspecified: Secondary | ICD-10-CM | POA: Diagnosis not present

## 2016-07-28 DIAGNOSIS — G4700 Insomnia, unspecified: Secondary | ICD-10-CM | POA: Diagnosis not present

## 2016-07-28 DIAGNOSIS — G4733 Obstructive sleep apnea (adult) (pediatric): Secondary | ICD-10-CM

## 2016-07-28 DIAGNOSIS — E669 Obesity, unspecified: Secondary | ICD-10-CM | POA: Diagnosis not present

## 2016-07-28 DIAGNOSIS — I5032 Chronic diastolic (congestive) heart failure: Secondary | ICD-10-CM

## 2016-07-28 LAB — CBC WITH DIFFERENTIAL/PLATELET
BASO%: 1 % (ref 0.0–2.0)
Basophils Absolute: 0 10*3/uL (ref 0.0–0.1)
EOS%: 1 % (ref 0.0–7.0)
Eosinophils Absolute: 0 10*3/uL (ref 0.0–0.5)
HCT: 45.4 % (ref 38.4–49.9)
HGB: 14.9 g/dL (ref 13.0–17.1)
LYMPH%: 30 % (ref 14.0–49.0)
MCH: 25.9 pg — ABNORMAL LOW (ref 27.2–33.4)
MCHC: 32.8 g/dL (ref 32.0–36.0)
MCV: 79 fL — ABNORMAL LOW (ref 79.3–98.0)
MONO#: 0.4 10*3/uL (ref 0.1–0.9)
MONO%: 10.7 % (ref 0.0–14.0)
NEUT%: 57.3 % (ref 39.0–75.0)
NEUTROS ABS: 2.2 10*3/uL (ref 1.5–6.5)
Platelets: 94 10*3/uL — ABNORMAL LOW (ref 140–400)
RBC: 5.75 10*6/uL (ref 4.20–5.82)
RDW: 20.5 % — AB (ref 11.0–14.6)
WBC: 3.8 10*3/uL — AB (ref 4.0–10.3)
lymph#: 1.2 10*3/uL (ref 0.9–3.3)
nRBC: 0 % (ref 0–0)

## 2016-07-28 LAB — COMPREHENSIVE METABOLIC PANEL
ALT: 18 U/L (ref 0–55)
AST: 25 U/L (ref 5–34)
Albumin: 3.5 g/dL (ref 3.5–5.0)
Alkaline Phosphatase: 126 U/L (ref 40–150)
Anion Gap: 9 mEq/L (ref 3–11)
BILIRUBIN TOTAL: 1.36 mg/dL — AB (ref 0.20–1.20)
BUN: 12.4 mg/dL (ref 7.0–26.0)
CHLORIDE: 102 meq/L (ref 98–109)
CO2: 26 meq/L (ref 22–29)
CREATININE: 0.9 mg/dL (ref 0.7–1.3)
Calcium: 9.5 mg/dL (ref 8.4–10.4)
GLUCOSE: 89 mg/dL (ref 70–140)
Potassium: 3.7 mEq/L (ref 3.5–5.1)
SODIUM: 137 meq/L (ref 136–145)
Total Protein: 6.9 g/dL (ref 6.4–8.3)

## 2016-07-28 MED ORDER — LEUCOVORIN CALCIUM INJECTION 350 MG
400.0000 mg/m2 | Freq: Once | INTRAVENOUS | Status: AC
  Administered 2016-07-28: 948 mg via INTRAVENOUS
  Filled 2016-07-28: qty 47.4

## 2016-07-28 MED ORDER — HEPARIN SOD (PORK) LOCK FLUSH 100 UNIT/ML IV SOLN
500.0000 [IU] | Freq: Once | INTRAVENOUS | Status: DC | PRN
  Filled 2016-07-28: qty 5

## 2016-07-28 MED ORDER — DEXAMETHASONE SODIUM PHOSPHATE 10 MG/ML IJ SOLN
INTRAMUSCULAR | Status: AC
  Filled 2016-07-28: qty 1

## 2016-07-28 MED ORDER — SODIUM CHLORIDE 0.9% FLUSH
10.0000 mL | INTRAVENOUS | Status: DC | PRN
  Filled 2016-07-28: qty 10

## 2016-07-28 MED ORDER — PALONOSETRON HCL INJECTION 0.25 MG/5ML
0.2500 mg | Freq: Once | INTRAVENOUS | Status: AC
  Administered 2016-07-28: 0.25 mg via INTRAVENOUS

## 2016-07-28 MED ORDER — OXALIPLATIN CHEMO INJECTION 100 MG/20ML
85.0000 mg/m2 | Freq: Once | INTRAVENOUS | Status: AC
  Administered 2016-07-28: 200 mg via INTRAVENOUS
  Filled 2016-07-28: qty 40

## 2016-07-28 MED ORDER — DEXAMETHASONE SODIUM PHOSPHATE 10 MG/ML IJ SOLN
10.0000 mg | Freq: Once | INTRAMUSCULAR | Status: AC
  Administered 2016-07-28: 10 mg via INTRAVENOUS

## 2016-07-28 MED ORDER — PALONOSETRON HCL INJECTION 0.25 MG/5ML
INTRAVENOUS | Status: AC
  Filled 2016-07-28: qty 5

## 2016-07-28 MED ORDER — FLUOROURACIL CHEMO INJECTION 2.5 GM/50ML
400.0000 mg/m2 | Freq: Once | INTRAVENOUS | Status: AC
  Administered 2016-07-28: 950 mg via INTRAVENOUS
  Filled 2016-07-28: qty 19

## 2016-07-28 MED ORDER — DEXTROSE 5 % IV SOLN
Freq: Once | INTRAVENOUS | Status: AC
  Administered 2016-07-28: 11:00:00 via INTRAVENOUS

## 2016-07-28 MED ORDER — SODIUM CHLORIDE 0.9 % IV SOLN
2400.0000 mg/m2 | INTRAVENOUS | Status: DC
  Administered 2016-07-28: 5700 mg via INTRAVENOUS
  Filled 2016-07-28: qty 114

## 2016-07-28 NOTE — Patient Instructions (Signed)
Return to [facility] @ 1:30 pm on Saturday, 07/16/2016 for pump disconnect.  [facility] Discharge Instructions for Patients Receiving Chemotherapy  Today you received the following chemotherapy agents Oxaliplatin, Leucovorin and Adrucil.  To help prevent nausea and vomiting after your treatment, we encourage you to take your nausea medication as directed. NO ZOFRAN FOR 3 DAYS.   If you develop nausea and vomiting that is not controlled by your nausea medication, call the clinic.   BELOW ARE SYMPTOMS THAT SHOULD BE REPORTED IMMEDIATELY:  *FEVER GREATER THAN 100.5 F  *CHILLS WITH OR WITHOUT FEVER  NAUSEA AND VOMITING THAT IS NOT CONTROLLED WITH YOUR NAUSEA MEDICATION  *UNUSUAL SHORTNESS OF BREATH  *UNUSUAL BRUISING OR BLEEDING  TENDERNESS IN MOUTH AND THROAT WITH OR WITHOUT PRESENCE OF ULCERS  *URINARY PROBLEMS  *BOWEL PROBLEMS  UNUSUAL RASH Items with * indicate a potential emergency and should be followed up as soon as possible.  Feel free to call the clinic you have any questions or concerns. The clinic phone number is [phone].  Please show the [place] at check-in to the Emergency Department and triage nurse.

## 2016-07-28 NOTE — Progress Notes (Signed)
Platelets 94 today, ok to treat with new orders per Dr Burr Medico

## 2016-07-30 ENCOUNTER — Ambulatory Visit (HOSPITAL_BASED_OUTPATIENT_CLINIC_OR_DEPARTMENT_OTHER): Payer: Self-pay

## 2016-07-30 VITALS — BP 99/71 | HR 76 | Temp 98.0°F | Resp 18

## 2016-07-30 DIAGNOSIS — C182 Malignant neoplasm of ascending colon: Secondary | ICD-10-CM

## 2016-07-30 MED ORDER — SODIUM CHLORIDE 0.9% FLUSH
10.0000 mL | INTRAVENOUS | Status: DC | PRN
  Administered 2016-07-30: 10 mL
  Filled 2016-07-30: qty 10

## 2016-07-30 MED ORDER — HEPARIN SOD (PORK) LOCK FLUSH 100 UNIT/ML IV SOLN
500.0000 [IU] | Freq: Once | INTRAVENOUS | Status: AC | PRN
  Administered 2016-07-30: 500 [IU]
  Filled 2016-07-30: qty 5

## 2016-07-30 NOTE — Progress Notes (Signed)
07/30/2016 Patient arrived for deaccess of home infusion.  VSS to patients baseline.  Patient stated that he had chest pains in the am that lasted for approximately 10-15 minutes but have since resolved.  Discussed with on call MD Dr. Julien Nordmann.  Ok to release.  Instructed patient to go to ER if chest pains return.  Patient and wife explained understanding.

## 2016-08-05 ENCOUNTER — Telehealth: Payer: Self-pay | Admitting: *Deleted

## 2016-08-05 NOTE — Telephone Encounter (Signed)
"  I'm calling for my husband who is sick.  Chemotherapy ended last week (07-30-2016).  By Sunday (08-01-2016) he started having stomach problems.  No appetite, stomach hurts, vomiting and diarrhea.  Family had a stomach virus.  I got Boost supplements.  He's taken Kaopectate, Zofran and Compazine.  No vomiting today.  Four stools today.  Drank about 32 oz so far of Gatorade & water..  Took lorazepam last on Wednesday."    Reviewed how to take immodium, foods to avoid and what to eat and drink due to having diarrhea.  "He does not like yogurt."  Advised he go to the ED if tired, distressed and unable to do normal day to day activities.

## 2016-08-06 ENCOUNTER — Encounter (HOSPITAL_COMMUNITY): Payer: Self-pay | Admitting: Emergency Medicine

## 2016-08-06 ENCOUNTER — Emergency Department (HOSPITAL_COMMUNITY): Payer: Medicaid Other

## 2016-08-06 ENCOUNTER — Other Ambulatory Visit: Payer: Self-pay

## 2016-08-06 ENCOUNTER — Inpatient Hospital Stay (HOSPITAL_COMMUNITY)
Admission: EM | Admit: 2016-08-06 | Discharge: 2016-08-11 | DRG: 871 | Disposition: A | Discharge status: Home / Self Care | Payer: Medicaid Other | Attending: Internal Medicine | Admitting: Internal Medicine

## 2016-08-06 DIAGNOSIS — E872 Acidosis: Secondary | ICD-10-CM | POA: Diagnosis present

## 2016-08-06 DIAGNOSIS — A419 Sepsis, unspecified organism: Secondary | ICD-10-CM | POA: Diagnosis present

## 2016-08-06 DIAGNOSIS — Z9221 Personal history of antineoplastic chemotherapy: Secondary | ICD-10-CM

## 2016-08-06 DIAGNOSIS — R6521 Severe sepsis with septic shock: Secondary | ICD-10-CM | POA: Diagnosis present

## 2016-08-06 DIAGNOSIS — E861 Hypovolemia: Secondary | ICD-10-CM | POA: Diagnosis present

## 2016-08-06 DIAGNOSIS — Z87891 Personal history of nicotine dependence: Secondary | ICD-10-CM

## 2016-08-06 DIAGNOSIS — I4821 Permanent atrial fibrillation: Secondary | ICD-10-CM | POA: Diagnosis present

## 2016-08-06 DIAGNOSIS — C189 Malignant neoplasm of colon, unspecified: Secondary | ICD-10-CM | POA: Diagnosis not present

## 2016-08-06 DIAGNOSIS — I110 Hypertensive heart disease with heart failure: Secondary | ICD-10-CM | POA: Diagnosis present

## 2016-08-06 DIAGNOSIS — Z9049 Acquired absence of other specified parts of digestive tract: Secondary | ICD-10-CM | POA: Diagnosis not present

## 2016-08-06 DIAGNOSIS — R509 Fever, unspecified: Secondary | ICD-10-CM

## 2016-08-06 DIAGNOSIS — D702 Other drug-induced agranulocytosis: Secondary | ICD-10-CM | POA: Diagnosis not present

## 2016-08-06 DIAGNOSIS — E860 Dehydration: Secondary | ICD-10-CM | POA: Diagnosis present

## 2016-08-06 DIAGNOSIS — I4891 Unspecified atrial fibrillation: Secondary | ICD-10-CM | POA: Diagnosis present

## 2016-08-06 DIAGNOSIS — I5032 Chronic diastolic (congestive) heart failure: Secondary | ICD-10-CM | POA: Diagnosis present

## 2016-08-06 DIAGNOSIS — C180 Malignant neoplasm of cecum: Secondary | ICD-10-CM | POA: Diagnosis present

## 2016-08-06 DIAGNOSIS — N179 Acute kidney failure, unspecified: Secondary | ICD-10-CM | POA: Diagnosis present

## 2016-08-06 DIAGNOSIS — C182 Malignant neoplasm of ascending colon: Secondary | ICD-10-CM | POA: Diagnosis present

## 2016-08-06 DIAGNOSIS — Z833 Family history of diabetes mellitus: Secondary | ICD-10-CM | POA: Diagnosis not present

## 2016-08-06 DIAGNOSIS — D6859 Other primary thrombophilia: Secondary | ICD-10-CM | POA: Diagnosis present

## 2016-08-06 DIAGNOSIS — I482 Chronic atrial fibrillation: Secondary | ICD-10-CM | POA: Diagnosis present

## 2016-08-06 DIAGNOSIS — M170 Bilateral primary osteoarthritis of knee: Secondary | ICD-10-CM | POA: Diagnosis present

## 2016-08-06 DIAGNOSIS — Z8249 Family history of ischemic heart disease and other diseases of the circulatory system: Secondary | ICD-10-CM

## 2016-08-06 DIAGNOSIS — Z8371 Family history of colonic polyps: Secondary | ICD-10-CM

## 2016-08-06 DIAGNOSIS — N170 Acute kidney failure with tubular necrosis: Secondary | ICD-10-CM | POA: Diagnosis not present

## 2016-08-06 DIAGNOSIS — D61810 Antineoplastic chemotherapy induced pancytopenia: Secondary | ICD-10-CM | POA: Diagnosis present

## 2016-08-06 DIAGNOSIS — D709 Neutropenia, unspecified: Secondary | ICD-10-CM | POA: Diagnosis not present

## 2016-08-06 DIAGNOSIS — E876 Hypokalemia: Secondary | ICD-10-CM | POA: Diagnosis present

## 2016-08-06 DIAGNOSIS — R571 Hypovolemic shock: Secondary | ICD-10-CM

## 2016-08-06 DIAGNOSIS — Z7901 Long term (current) use of anticoagulants: Secondary | ICD-10-CM | POA: Diagnosis not present

## 2016-08-06 DIAGNOSIS — G9341 Metabolic encephalopathy: Secondary | ICD-10-CM | POA: Diagnosis present

## 2016-08-06 DIAGNOSIS — Z801 Family history of malignant neoplasm of trachea, bronchus and lung: Secondary | ICD-10-CM

## 2016-08-06 DIAGNOSIS — D703 Neutropenia due to infection: Secondary | ICD-10-CM | POA: Diagnosis present

## 2016-08-06 DIAGNOSIS — G4733 Obstructive sleep apnea (adult) (pediatric): Secondary | ICD-10-CM | POA: Diagnosis present

## 2016-08-06 DIAGNOSIS — Z808 Family history of malignant neoplasm of other organs or systems: Secondary | ICD-10-CM

## 2016-08-06 DIAGNOSIS — T451X5A Adverse effect of antineoplastic and immunosuppressive drugs, initial encounter: Secondary | ICD-10-CM | POA: Diagnosis present

## 2016-08-06 DIAGNOSIS — A0811 Acute gastroenteropathy due to Norwalk agent: Secondary | ICD-10-CM | POA: Diagnosis present

## 2016-08-06 DIAGNOSIS — Z7982 Long term (current) use of aspirin: Secondary | ICD-10-CM

## 2016-08-06 DIAGNOSIS — D701 Agranulocytosis secondary to cancer chemotherapy: Secondary | ICD-10-CM | POA: Diagnosis present

## 2016-08-06 DIAGNOSIS — R579 Shock, unspecified: Secondary | ICD-10-CM | POA: Diagnosis not present

## 2016-08-06 DIAGNOSIS — I10 Essential (primary) hypertension: Secondary | ICD-10-CM | POA: Diagnosis not present

## 2016-08-06 DIAGNOSIS — Z823 Family history of stroke: Secondary | ICD-10-CM

## 2016-08-06 DIAGNOSIS — Z800 Family history of malignant neoplasm of digestive organs: Secondary | ICD-10-CM

## 2016-08-06 DIAGNOSIS — Z79899 Other long term (current) drug therapy: Secondary | ICD-10-CM

## 2016-08-06 LAB — CBC WITH DIFFERENTIAL/PLATELET
Basophils Absolute: 0 10*3/uL (ref 0.0–0.1)
Basophils Relative: 1 %
EOS PCT: 1 %
Eosinophils Absolute: 0 10*3/uL (ref 0.0–0.7)
HCT: 43.9 % (ref 39.0–52.0)
HEMOGLOBIN: 15.4 g/dL (ref 13.0–17.0)
Lymphocytes Relative: 34 %
Lymphs Abs: 0.7 10*3/uL (ref 0.7–4.0)
MCH: 26.2 pg (ref 26.0–34.0)
MCHC: 35.1 g/dL (ref 30.0–36.0)
MCV: 74.8 fL — ABNORMAL LOW (ref 78.0–100.0)
MONOS PCT: 28 %
Monocytes Absolute: 0.6 10*3/uL (ref 0.1–1.0)
Neutro Abs: 0.9 10*3/uL — ABNORMAL LOW (ref 1.7–7.7)
Neutrophils Relative %: 36 %
Platelets: 182 10*3/uL (ref 150–400)
RBC: 5.87 MIL/uL — AB (ref 4.22–5.81)
RDW: 20.7 % — ABNORMAL HIGH (ref 11.5–15.5)
WBC: 2.2 10*3/uL — AB (ref 4.0–10.5)

## 2016-08-06 LAB — BLOOD GAS, ARTERIAL
Acid-base deficit: 14.8 mmol/L — ABNORMAL HIGH (ref 0.0–2.0)
Bicarbonate: 10.3 mmol/L — ABNORMAL LOW (ref 20.0–28.0)
Drawn by: 308601
O2 CONTENT: 4 L/min
O2 Saturation: 96.2 %
PCO2 ART: 22.9 mmHg — AB (ref 32.0–48.0)
PO2 ART: 101 mmHg (ref 83.0–108.0)
Patient temperature: 37
pH, Arterial: 7.275 — ABNORMAL LOW (ref 7.350–7.450)

## 2016-08-06 LAB — I-STAT CHEM 8, ED
BUN: 66 mg/dL — ABNORMAL HIGH (ref 6–20)
CHLORIDE: 105 mmol/L (ref 101–111)
CREATININE: 4.7 mg/dL — AB (ref 0.61–1.24)
Calcium, Ion: 1.05 mmol/L — ABNORMAL LOW (ref 1.15–1.40)
GLUCOSE: 118 mg/dL — AB (ref 65–99)
HCT: 51 % (ref 39.0–52.0)
Hemoglobin: 17.3 g/dL — ABNORMAL HIGH (ref 13.0–17.0)
POTASSIUM: 2.9 mmol/L — AB (ref 3.5–5.1)
Sodium: 133 mmol/L — ABNORMAL LOW (ref 135–145)
TCO2: 14 mmol/L (ref 0–100)

## 2016-08-06 LAB — COMPREHENSIVE METABOLIC PANEL
ALBUMIN: 3.2 g/dL — AB (ref 3.5–5.0)
ALK PHOS: 72 U/L (ref 38–126)
ALT: 20 U/L (ref 17–63)
ANION GAP: 20 — AB (ref 5–15)
AST: 20 U/L (ref 15–41)
BUN: 75 mg/dL — AB (ref 6–20)
CALCIUM: 8.5 mg/dL — AB (ref 8.9–10.3)
CO2: 10 mmol/L — AB (ref 22–32)
CREATININE: 4.65 mg/dL — AB (ref 0.61–1.24)
Chloride: 102 mmol/L (ref 101–111)
GFR calc Af Amer: 14 mL/min — ABNORMAL LOW (ref >60)
GFR calc non Af Amer: 12 mL/min — ABNORMAL LOW (ref >60)
GLUCOSE: 122 mg/dL — AB (ref 65–99)
Potassium: 2.9 mmol/L — ABNORMAL LOW (ref 3.5–5.1)
SODIUM: 132 mmol/L — AB (ref 135–145)
Total Bilirubin: 1.4 mg/dL — ABNORMAL HIGH (ref 0.3–1.2)
Total Protein: 6.9 g/dL (ref 6.5–8.1)

## 2016-08-06 LAB — URINALYSIS, ROUTINE W REFLEX MICROSCOPIC
Bilirubin Urine: NEGATIVE
GLUCOSE, UA: NEGATIVE mg/dL
Ketones, ur: NEGATIVE mg/dL
Leukocytes, UA: NEGATIVE
NITRITE: NEGATIVE
PH: 5 (ref 5.0–8.0)
Protein, ur: 30 mg/dL — AB
SPECIFIC GRAVITY, URINE: 1.017 (ref 1.005–1.030)

## 2016-08-06 LAB — TYPE AND SCREEN
ABO/RH(D): O POS
ANTIBODY SCREEN: NEGATIVE

## 2016-08-06 LAB — BLOOD GAS, VENOUS
ACID-BASE DEFICIT: 15.3 mmol/L — AB (ref 0.0–2.0)
BICARBONATE: 11.8 mmol/L — AB (ref 20.0–28.0)
DRAWN BY: 295031
O2 Content: 2 L/min
O2 SAT: 52.8 %
PCO2 VEN: 32.3 mmHg — AB (ref 44.0–60.0)
Patient temperature: 98.6
pH, Ven: 7.19 — CL (ref 7.250–7.430)
pO2, Ven: 36.3 mmHg (ref 32.0–45.0)

## 2016-08-06 LAB — I-STAT CG4 LACTIC ACID, ED
LACTIC ACID, VENOUS: 3.77 mmol/L — AB (ref 0.5–1.9)
Lactic Acid, Venous: 2.82 mmol/L (ref 0.5–1.9)

## 2016-08-06 LAB — BASIC METABOLIC PANEL
Anion gap: 15 (ref 5–15)
BUN: 74 mg/dL — AB (ref 6–20)
CALCIUM: 7.9 mg/dL — AB (ref 8.9–10.3)
CHLORIDE: 105 mmol/L (ref 101–111)
CO2: 14 mmol/L — AB (ref 22–32)
CREATININE: 4.32 mg/dL — AB (ref 0.61–1.24)
GFR calc non Af Amer: 13 mL/min — ABNORMAL LOW (ref >60)
GFR, EST AFRICAN AMERICAN: 15 mL/min — AB (ref >60)
Glucose, Bld: 113 mg/dL — ABNORMAL HIGH (ref 65–99)
Potassium: 2.6 mmol/L — CL (ref 3.5–5.1)
SODIUM: 134 mmol/L — AB (ref 135–145)

## 2016-08-06 LAB — CBC
HCT: 41.2 % (ref 39.0–52.0)
Hemoglobin: 14.5 g/dL (ref 13.0–17.0)
MCH: 26.5 pg (ref 26.0–34.0)
MCHC: 35.2 g/dL (ref 30.0–36.0)
MCV: 75.2 fL — AB (ref 78.0–100.0)
PLATELETS: 160 10*3/uL (ref 150–400)
RBC: 5.48 MIL/uL (ref 4.22–5.81)
RDW: 20.8 % — AB (ref 11.5–15.5)
WBC: 1.1 10*3/uL — AB (ref 4.0–10.5)

## 2016-08-06 LAB — ABO/RH: ABO/RH(D): O POS

## 2016-08-06 LAB — LACTIC ACID, PLASMA: Lactic Acid, Venous: 2.6 mmol/L (ref 0.5–1.9)

## 2016-08-06 LAB — MAGNESIUM: Magnesium: 1.8 mg/dL (ref 1.7–2.4)

## 2016-08-06 LAB — PROTIME-INR: INR: >10

## 2016-08-06 LAB — POC OCCULT BLOOD, ED: Fecal Occult Bld: POSITIVE — AB

## 2016-08-06 LAB — MRSA PCR SCREENING: MRSA by PCR: NEGATIVE

## 2016-08-06 IMAGING — DX DG CHEST 1V PORT
1 series · 1 of 1 positions shown · non-contrast
Comparison: [DATE]

CLINICAL DATA: Respiratory distress, hypoxia and hypotension.

EXAM:
PORTABLE CHEST 1 VIEW

[chest pa]
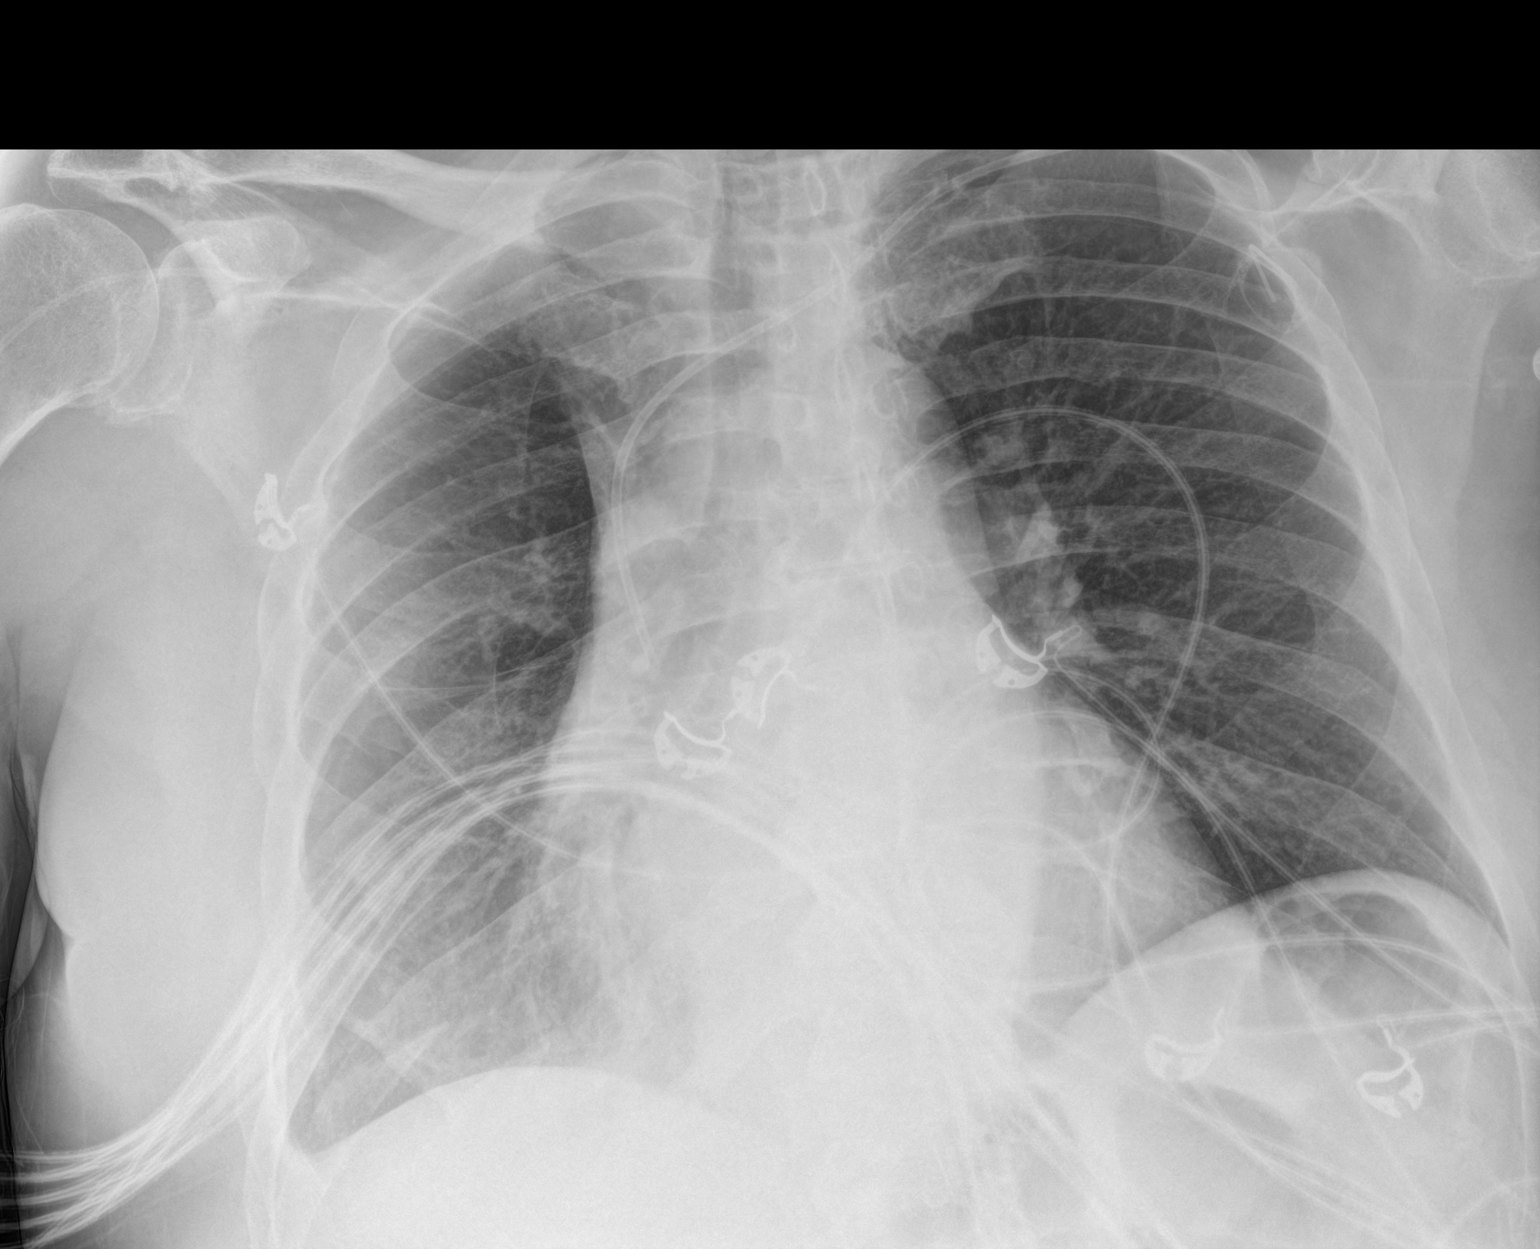

[1 of 1 positions shown; findings below may reference images not displayed]

FINDINGS: Stable appearance of left-sided Port-A-Cath with the catheter tip in
the SVC. Lungs show stable chronic lung disease without edema,
airspace consolidation, pleural fluid or pneumothorax. The heart
size is at the upper limits of normal.
IMPRESSION: Stable chronic lung disease.  No acute findings.

## 2016-08-06 MED ORDER — ONDANSETRON HCL 4 MG/2ML IJ SOLN
4.0000 mg | Freq: Four times a day (QID) | INTRAMUSCULAR | Status: DC | PRN
Start: 2016-08-06 — End: 2016-08-11

## 2016-08-06 MED ORDER — CIPROFLOXACIN IN D5W 400 MG/200ML IV SOLN
400.0000 mg | INTRAVENOUS | Status: DC
  Administered 2016-08-06 – 2016-08-07 (×2): 400 mg via INTRAVENOUS
  Filled 2016-08-06 (×2): qty 200

## 2016-08-06 MED ORDER — PANTOPRAZOLE SODIUM 40 MG IV SOLR
40.0000 mg | Freq: Once | INTRAVENOUS | Status: AC
  Administered 2016-08-06: 40 mg via INTRAVENOUS
  Filled 2016-08-06: qty 40

## 2016-08-06 MED ORDER — SODIUM CHLORIDE 0.9 % IV BOLUS (SEPSIS)
1000.0000 mL | Freq: Once | INTRAVENOUS | Status: AC
  Administered 2016-08-06: 1000 mL via INTRAVENOUS

## 2016-08-06 MED ORDER — SODIUM CHLORIDE 0.9 % IV SOLN
Freq: Once | INTRAVENOUS | Status: DC
  Filled 2016-08-06: qty 1000

## 2016-08-06 MED ORDER — SODIUM CHLORIDE 0.9 % IV SOLN
30.0000 meq | Freq: Once | INTRAVENOUS | Status: AC
  Administered 2016-08-06: 30 meq via INTRAVENOUS
  Filled 2016-08-06: qty 15

## 2016-08-06 MED ORDER — PIPERACILLIN-TAZOBACTAM 3.375 G IVPB
3.3750 g | Freq: Once | INTRAVENOUS | Status: AC
  Administered 2016-08-06: 3.375 g via INTRAVENOUS
  Filled 2016-08-06: qty 50

## 2016-08-06 MED ORDER — PANTOPRAZOLE SODIUM 40 MG IV SOLR
40.0000 mg | INTRAVENOUS | Status: DC
  Administered 2016-08-06 – 2016-08-08 (×3): 40 mg via INTRAVENOUS
  Filled 2016-08-06 (×3): qty 40

## 2016-08-06 MED ORDER — SODIUM BICARBONATE 8.4 % IV SOLN
INTRAVENOUS | Status: DC
  Administered 2016-08-06: 22:00:00 via INTRAVENOUS
  Filled 2016-08-06: qty 50

## 2016-08-06 MED ORDER — LACTATED RINGERS IV BOLUS (SEPSIS)
1000.0000 mL | Freq: Once | INTRAVENOUS | Status: AC
  Administered 2016-08-06: 1000 mL via INTRAVENOUS

## 2016-08-06 MED ORDER — METRONIDAZOLE IN NACL 5-0.79 MG/ML-% IV SOLN
500.0000 mg | Freq: Three times a day (TID) | INTRAVENOUS | Status: DC
  Administered 2016-08-06 – 2016-08-09 (×8): 500 mg via INTRAVENOUS
  Filled 2016-08-06 (×8): qty 100

## 2016-08-06 MED ORDER — NOREPINEPHRINE BITARTRATE 1 MG/ML IV SOLN
0.0000 ug/min | Freq: Once | INTRAVENOUS | Status: AC
  Administered 2016-08-06: 10 ug/min via INTRAVENOUS
  Filled 2016-08-06: qty 4

## 2016-08-06 MED ORDER — NOREPINEPHRINE BITARTRATE 1 MG/ML IV SOLN
0.0000 ug/min | INTRAVENOUS | Status: DC
  Administered 2016-08-06: 14 ug/min via INTRAVENOUS
  Administered 2016-08-07: 12 ug/min via INTRAVENOUS
  Filled 2016-08-06 (×4): qty 4

## 2016-08-06 MED ORDER — POTASSIUM CHLORIDE 2 MEQ/ML IV SOLN
30.0000 meq | Freq: Once | INTRAVENOUS | Status: AC
  Administered 2016-08-06: 30 meq via INTRAVENOUS
  Filled 2016-08-06: qty 15

## 2016-08-06 MED ORDER — SODIUM CHLORIDE 0.9 % IV SOLN
8.0000 mg/h | INTRAVENOUS | Status: DC
  Administered 2016-08-06: 8 mg/h via INTRAVENOUS
  Filled 2016-08-06 (×2): qty 80

## 2016-08-06 MED ORDER — SODIUM CHLORIDE 0.9 % IV SOLN
INTRAVENOUS | Status: DC
  Administered 2016-08-06: 20:00:00 via INTRAVENOUS

## 2016-08-06 MED ORDER — VITAMIN K1 10 MG/ML IJ SOLN
10.0000 mg | Freq: Once | INTRAVENOUS | Status: AC
  Administered 2016-08-06: 10 mg via INTRAVENOUS
  Filled 2016-08-06: qty 1

## 2016-08-06 MED ORDER — VANCOMYCIN HCL 10 G IV SOLR
2000.0000 mg | Freq: Once | INTRAVENOUS | Status: AC
  Administered 2016-08-06: 2000 mg via INTRAVENOUS
  Filled 2016-08-06: qty 2000

## 2016-08-06 NOTE — ED Notes (Signed)
Pt's moved to [facility]

## 2016-08-06 NOTE — ED Notes (Signed)
Notes for 08/06/2016 13:09 triage assessments documented on wrong patient.

## 2016-08-06 NOTE — ED Notes (Signed)
Abnormal chem 8 results reported to Dr. Kathrynn Humble

## 2016-08-06 NOTE — ED Provider Notes (Signed)
[facility] DEPT Provider Note   CSN: [id number] Arrival date & time: 08/06/16  1252     History   Chief Complaint Chief Complaint  Patient presents with  . Cancer  . Emesis  . Diarrhea    HPI DREAM NODAL is a 66 y.o. male.  HPI  66 y/o with Stage III colon cancer s/p Rt hemicolectomy October 2017 on chemotherapy (FOLFOX) with last chemo on 07/28/16, A-fib on Coumadin, CHF who presents to the ER with nausea, vomiting and loose stools. Pt is noted to be somnolent and hypotensive. Pt's family reports that pt has been sick all week, but starting 2 days ago he has declined quickly and got weaker. Pt also has been having loose BM, including some bloody stools. No recent antibiotics. Pt has mild abd pain. Pt has no hx of bleeding in the past. Pt denies fevers, chills, chest pains, shortness of breath, cough, headaches, uti like symptoms.   Past Medical History:  Diagnosis Date  . Anemia   . Arthralgia of both knees   . Arthritis    "right knee" (10/01/2015)  . Atrial fibrillation ([place])   . Cancer (HCC)    STAGE 1 COLON CANCER  . Chronic anticoagulation 2010   Coumadin  . Chronic diastolic CHF (congestive heart failure), NYHA class 2 ([place])   . Hypertension   . OSA (obstructive sleep apnea) 2016   "couldn't take the mask during the testing" (10/01/2015)  . Permanent atrial fibrillation ([place]) 2010  . Pneumonia 3/29/2017and june 2017    Patient Active Problem List   Diagnosis Date Noted  . Shock ([place]) 08/06/2016  . Genetic testing 07/01/2016  . Port catheter in place 06/29/2016  . Family history of colon cancer 06/08/2016  . Family history of colonic polyps 06/08/2016  . Cancer of right colon ([place]) 04/12/2016  . Preoperative cardiovascular examination 04/06/2016  . CAP (community acquired pneumonia) 02/02/2016  . Acute on chronic diastolic heart failure ([place]) 02/02/2016  . Acute respiratory failure with hypoxia ([place]) 02/02/2016  . Anemia, iron deficiency 02/02/2016    . Current use of long term anticoagulation 12/03/2015  . Pneumonia 09/30/2015  . PNA (pneumonia) 09/30/2015  . Chronic anticoagulation - Coumadin, CHADS2VASC=2 08/05/2015  . Acute on chronic diastolic CHF (congestive heart failure), NYHA class 4 ([place]) 08/05/2015  . Acute on chronic congestive heart failure ([place])   . CHF exacerbation ([place]) 07/08/2015  . Community acquired pneumonia 02/10/2015  . ED (erectile dysfunction) 08/12/2014  . Arthralgia of both knees 04/08/2014  . Acute on chronic diastolic congestive heart failure ([place]) 01/14/2009  . Morbid obesity ([place]) 12/31/2008  . Obstructive sleep apnea 12/31/2008  . Essential hypertension, benign 12/31/2008  . Permanent atrial fibrillation ([place]) 12/31/2008  . DIASTOLIC HEART FAILURE, CHRONIC 12/31/2008    Past Surgical History:  Procedure Laterality Date  . COLONOSCOPY WITH PROPOFOL N/A 03/04/2016   Procedure: COLONOSCOPY WITH PROPOFOL;  Surgeon: Carol Ada, MD;  Location: [facility];  Service: Endoscopy;  Laterality: N/A;  . LAPAROSCOPIC PARTIAL COLECTOMY N/A 04/12/2016   Procedure: LAPAROSCOPIC ILEOCOLECTOMY;  Surgeon: [name], MD;  Location: [facility];  Service: General;  Laterality: N/A;  . PORTACATH PLACEMENT N/A 05/18/2016   Procedure: INSERTION PORT-A-CATH;  Surgeon: [name], MD;  Location: [facility];  Service: General;  Laterality: N/A;  . THORACOTOMY Right 2010       Home Medications    Prior to Admission medications   Medication Sig Start Date End Date Taking? Authorizing Provider  acetaminophen (TYLENOL)  500 MG tablet Take 1,000 mg by mouth 2 (two) times daily.   Yes Historical Provider, MD  albuterol (PROVENTIL HFA;VENTOLIN HFA) 108 (90 Base) MCG/ACT inhaler Inhale 2 puffs into the lungs every 6 (six) hours as needed for wheezing or shortness of breath. 10/01/15  Yes Nita Sells, MD  aspirin EC 81 MG tablet Take 81 mg by mouth daily.   Yes Historical Provider, MD  diltiazem (CARDIZEM)  120 MG tablet Take 120 mg by mouth daily. 05/14/14  Yes Historical Provider, MD  docusate sodium (COLACE) 100 MG capsule Take 1 capsule (100 mg total) by mouth 2 (two) times daily. For constipation 07/12/15  Yes Ripudeep Krystal Eaton, MD  ferrous sulfate 325 (65 FE) MG tablet Take 1 tablet (325 mg total) by mouth 2 (two) times daily with a meal. 02/06/16  Yes Hosie Poisson, MD  furosemide (LASIX) 80 MG tablet TAKE ONE TABLET BY MOUTH TWICE DAILY 06/02/16  Yes Pixie Casino, MD  hydrALAZINE (APRESOLINE) 50 MG tablet TAKE ONE TABLET BY MOUTH TWICE DAILY 03/29/16  Yes Pixie Casino, MD  ipratropium (ATROVENT HFA) 17 MCG/ACT inhaler Inhale 2 puffs into the lungs every 6 (six) hours as needed for wheezing.    Yes Historical Provider, MD  lidocaine-prilocaine (EMLA) cream Apply to affected area once Patient taking differently: Apply 1 application topically once. Apply to affected area once 05/11/16  Yes [name], MD  lisinopril (PRINIVIL,ZESTRIL) 20 MG tablet TAKE ONE TABLET BY MOUTH ONCE DAILY 04/29/16  Yes Pixie Casino, MD  LORazepam (ATIVAN) 0.5 MG tablet Take 1 tablet (0.5 mg total) by mouth every 6 (six) hours as needed (Nausea or vomiting). 05/11/16  Yes [name], MD  metoprolol tartrate (LOPRESSOR) 25 MG tablet TAKE ONE TABLET BY MOUTH TWICE DAILY 07/05/16  Yes Pixie Casino, MD  ondansetron (ZOFRAN) 8 MG tablet Take 1 tablet (8 mg total) by mouth 2 (two) times daily as needed for refractory nausea / vomiting. Start on day 3 after chemotherapy. 05/11/16  Yes [name], MD  potassium chloride SA (K-DUR,KLOR-CON) 20 MEQ tablet Take 2 tablets (40 mEq total) by mouth daily. 12/16/15  Yes Pixie Casino, MD  prochlorperazine (COMPAZINE) 10 MG tablet Take 1 tablet (10 mg total) by mouth every 6 (six) hours as needed (Nausea or vomiting). Patient taking differently: Take 10 mg by mouth every 6 (six) hours as needed for nausea or vomiting.  05/11/16  Yes [name], MD  sildenafil (REVATIO) 20 MG tablet TAKE TWO TO THREE  TABLETS BY MOUTH ONCE DAILY AS NEEDED FOR SEXUAL ACTIVITY 06/06/16  Yes Pixie Casino, MD  warfarin (COUMADIN) 4 MG tablet TAKE TWO TABLETS BY MOUTH ONCE DAILY OR  AS  DIRECTED  BY  THE  COUMADIN  CLINIC 02/19/16  Yes Pixie Casino, MD  guaiFENesin-dextromethorphan (ROBITUSSIN DM) 100-10 MG/5ML syrup Take 5 mLs by mouth every 4 (four) hours as needed for cough. Patient not taking: Reported on 06/16/2016 02/06/16   Hosie Poisson, MD  oxyCODONE (OXY IR/ROXICODONE) 5 MG immediate release tablet Take 1-2 tablets (5-10 mg total) by mouth every 6 (six) hours as needed for moderate pain, severe pain or breakthrough pain. Patient not taking: Reported on 07/28/2016 05/18/16   [name], MD    Family History Family History  Problem Relation Age of Onset  . Heart disease Father   . Heart failure Father   . Colon polyps Father     unspecified number - pt of intestine surgically resected  .  Hypertension Mother   . Diabetes Mother   . Hyperlipidemia Mother   . Colon polyps Mother     unspecified number - pt of intestine surgically resected  . Dementia Mother     d. 8  . Stroke Maternal Grandmother   . Stroke Sister   . Colon cancer Sister 66    w/ "cancerous polyps" - unspecified number; underwent surgery and radiation  . Colon polyps Brother     unspecified number - polypectomies  . Colon cancer Sister     dx 67-60; s/p surgery and radiation  . Bone cancer Maternal Uncle     dx. 25s  . Lung cancer Paternal Grandfather     d. 80s-90s; lung cancer vs TB  . Colon cancer Sister     dx. 59-60; s/p surgery and radiation  . Cancer Maternal Uncle     mother had about 7-8 other siblings, most passed at older ages, many of whom had some type of cancer  . Heart attack Neg Hx     Social History Social History  Substance Use Topics  . Smoking status: Former Smoker    Packs/day: 0.10    Years: 2.00    Types: Cigarettes    Quit date: 12/16/1974  . Smokeless tobacco: Never Used     Comment: "1  pack cigarettes would last me a month"  . Alcohol use 0.0 oz/week     Comment: 10/01/2015 "might have1 6 pack of beer/year"     Allergies   No known allergies   Review of Systems Review of Systems  Constitutional: Positive for activity change and appetite change.  Respiratory: Negative for cough and shortness of breath.   Cardiovascular: Negative for chest pain.  Gastrointestinal: Positive for abdominal pain and diarrhea.  Genitourinary: Negative for dysuria.  Neurological: Positive for light-headedness.  All other systems reviewed and are negative.    Physical Exam Updated Vital Signs BP 111/74   Pulse 71   Temp 97.7 F (36.5 C) (Rectal)   Resp 24 Comment: 2L  Ht 5\' 10"  (1.778 m)   Wt 250 lb (113.4 kg)   SpO2 93%   BMI 35.87 kg/m   Physical Exam  Constitutional: He is oriented to person, place, and time. He appears well-developed.  HENT:  Head: Normocephalic and atraumatic.  Mucus membranes are dry  Eyes: Conjunctivae and EOM are normal. Pupils are equal, round, and reactive to light.  Neck: Normal range of motion. Neck supple.  Cardiovascular: Normal rate and regular rhythm.   Pulmonary/Chest: Effort normal and breath sounds normal.  Abdominal: Soft. Bowel sounds are normal. He exhibits no distension and no mass. There is tenderness. There is no rebound and no guarding.  Generalized tenderness, no peritoneal signs  Musculoskeletal: He exhibits no deformity.  Neurological: He is alert and oriented to person, place, and time.  Skin: Skin is warm and dry.  Nursing note and vitals reviewed.    ED Treatments / Results  Labs (all labs ordered are listed, but only abnormal results are displayed) Labs Reviewed  COMPREHENSIVE METABOLIC PANEL - Abnormal; Notable for the following:       Result Value   Sodium 132 (*)    Potassium 2.9 (*)    CO2 10 (*)    Glucose, Bld 122 (*)    BUN 75 (*)    Creatinine, Ser 4.65 (*)    Calcium 8.5 (*)    Albumin 3.2 (*)     Total Bilirubin 1.4 (*)  GFR calc non Af Amer 12 (*)    GFR calc Af Amer 14 (*)    Anion gap 20 (*)    All other components within normal limits  CBC WITH DIFFERENTIAL/PLATELET - Abnormal; Notable for the following:    WBC 2.2 (*)    RBC 5.87 (*)    MCV 74.8 (*)    RDW 20.7 (*)    Neutro Abs 0.9 (*)    All other components within normal limits  PROTIME-INR - Abnormal; Notable for the following:    Prothrombin Time >90.0 (*)    INR >10.00 (*)    All other components within normal limits  BLOOD GAS, VENOUS - Abnormal; Notable for the following:    pH, Ven 7.190 (*)    pCO2, Ven 32.3 (*)    Bicarbonate 11.8 (*)    Acid-base deficit 15.3 (*)    All other components within normal limits  I-STAT CG4 LACTIC ACID, ED - Abnormal; Notable for the following:    Lactic Acid, Venous 2.82 (*)    All other components within normal limits  I-STAT CHEM 8, ED - Abnormal; Notable for the following:    Sodium 133 (*)    Potassium 2.9 (*)    BUN 66 (*)    Creatinine, Ser 4.70 (*)    Glucose, Bld 118 (*)    Calcium, Ion 1.05 (*)    Hemoglobin 17.3 (*)    All other components within normal limits  POC OCCULT BLOOD, ED - Abnormal; Notable for the following:    Fecal Occult Bld POSITIVE (*)    All other components within normal limits  I-STAT CG4 LACTIC ACID, ED - Abnormal; Notable for the following:    Lactic Acid, Venous 3.77 (*)    All other components within normal limits  CULTURE, BLOOD (ROUTINE X 2)  CULTURE, BLOOD (ROUTINE X 2)  URINE CULTURE  GASTROINTESTINAL PANEL BY PCR, STOOL (REPLACES STOOL CULTURE)  URINALYSIS, ROUTINE W REFLEX MICROSCOPIC  MAGNESIUM  CBC  TYPE AND SCREEN  PREPARE FRESH FROZEN PLASMA  ABO/RH    EKG  EKG Interpretation None       Radiology Dg Chest Port 1 View  Result Date: 08/06/2016 CLINICAL DATA:  Respiratory distress, hypoxia and hypotension. EXAM: PORTABLE CHEST 1 VIEW COMPARISON:  05/18/2016 FINDINGS: Stable appearance of left-sided Port-A-Cath  with the catheter tip in the SVC. Lungs show stable chronic lung disease without edema, airspace consolidation, pleural fluid or pneumothorax. The heart size is at the upper limits of normal. IMPRESSION: Stable chronic lung disease.  No acute findings. Electronically Signed   By: Aletta Edouard M.D.   On: 08/06/2016 14:58    Procedures Procedures (including critical care time)  CRITICAL CARE Performed by: Varney Biles   Total critical care time: 80 minutes  Critical care time was exclusive of separately billable procedures and treating other patients.  Critical care was necessary to treat or prevent imminent or life-threatening deterioration.  Critical care was time spent personally by me on the following activities: development of treatment plan with patient and/or surrogate as well as nursing, discussions with consultants, evaluation of patient's response to treatment, examination of patient, obtaining history from patient or surrogate, ordering and performing treatments and interventions, ordering and review of laboratory studies, ordering and review of radiographic studies, pulse oximetry and re-evaluation of patient's condition.   Medications Ordered in ED Medications  potassium chloride 30 mEq in sodium chloride 0.9 % 265 mL (KCL MULTIRUN) IVPB (30 mEq Intravenous Given  08/06/16 1521)  piperacillin-tazobactam (ZOSYN) IVPB 3.375 g (3.375 g Intravenous New Bag/Given 08/06/16 1559)  phytonadione (VITAMIN K) 10 mg in dextrose 5 % 50 mL IVPB (not administered)  pantoprazole (PROTONIX) 80 mg in sodium chloride 0.9 % 250 mL (0.32 mg/mL) infusion (not administered)  sodium chloride 0.9 % bolus 1,000 mL (1,000 mLs Intravenous New Bag/Given 08/06/16 1400)  sodium chloride 0.9 % bolus 1,000 mL (0 mLs Intravenous Stopped 08/06/16 1440)  lactated ringers bolus 1,000 mL (1,000 mLs Intravenous New Bag/Given 08/06/16 1440)  norepinephrine (LEVOPHED) 4 mg in dextrose 5 % 250 mL (0.016 mg/mL) infusion (5  mcg/min Intravenous Rate/Dose Change 08/06/16 1604)  pantoprazole (PROTONIX) injection 40 mg (40 mg Intravenous Given 08/06/16 1631)     Initial Impression / Assessment and Plan / ED Course  I have reviewed the triage vital signs and the nursing notes.  Pertinent labs & imaging results that were available during my care of the patient were reviewed by me and considered in my medical decision making (see chart for details).  Clinical Course as of Aug 11 1700  Fri Aug 05, 2016  1658 Sepsis reassessment completed. Pt's lactate is higher. However, he clinically looks a little better - more alert, improved HR. Pt is still critical and he is on pressors for his persistent hypotension. Pt will be receiving 30cc/kg of fluid in the ER. CCM is admitting. Lactic Acid, Venous: (!!) 3.77 [AN]    Clinical Course User Index [AN] Varney Biles, MD    Pt arrived with cc of abd pain, diarrhea, poor appetite, bloody stools. He is noted to be hypotensive and dry. Pt has colon CA hx and is on chemo.  DDX: Chemo related colitis, viral gastro, infectious colitis, mesenteric ischemia, septic shock, hypovolemic shock.  Fluid resuscitation started. Code sepsis called and workup initiated. Zosyn will be ordered now as we work out the exact etiology. Pt will be reassess prior to deciding admission to ICU VS. STEPDOWN.  Abd exam is not significantly tender. I doubt mesenteric ischemia. No CT is needed at this time of the abd. Stool studies sent out.  REASSESSMENT: Multiple reassessments done. Clinically improving, but pt remained hypotensive, and after 2 liters of IVF, we started pt on pressors. Pt has AKI and the results discussed. Also, metabolic acidosis noted. CCM has been alerted. Pt has a port for chemo, and we have decided to utilize that for the pressors. Pt's INR is > 10. Likely due to AKI. Pt has some GI bleed, but it is not life  Threatening. FFP and vit K ordered. PT will be closely  monitored. Final Clinical Impressions(s) / ED Diagnoses   Final diagnoses:  Dehydration  Shock ([place])  Hypokalemia  AKI (acute kidney injury) ([place])  Hypercoagulable state ([place])    New Prescriptions New Prescriptions   No medications on file     Varney Biles, MD 08/11/16 1729

## 2016-08-06 NOTE — ED Notes (Signed)
[facility] notified of pt status and complaint.

## 2016-08-06 NOTE — ED Triage Notes (Deleted)
Pt with flu like symptoms since last night. C/o cough and nasal congestion, sore throat. Pt states her husband was seen here and tested positive for flu.

## 2016-08-06 NOTE — H&P (Signed)
PCCM History and Physical Note  Admission date: 08/06/2016 Referring provider: Dr. Lenoria Chime, ER  CC: Nausea  HPI: 66 yo male presented with nausea, vomiting and diarrhea.  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  He had infusion catheter removed on 07/30/16.  He was doing well until about 2 days prior to admission.  He started getting nausea.  He has been throwing up bilious fluid.  He has also been getting loose stools.  He denies blood in his stool.  He has denies fever, sinus congestion, sore throat, chest pain, dyspnea.  Denies recent sick exposures.  His blood pressure was low and he was given IV fluid.  It remained low and pressor added through port.  He  has a past medical history of Anemia; Arthralgia of both knees; Arthritis; Atrial fibrillation ([place]); Cancer ([place]); Chronic anticoagulation (2010); Chronic diastolic CHF (congestive heart failure), NYHA class 2 ([place]); Hypertension; OSA (obstructive sleep apnea) (2016); Permanent atrial fibrillation ([place]) (2010); and Pneumonia (3/29/2017and june 2017).  He  has a past surgical history that includes Thoracotomy (Right, 2010); Colonoscopy with propofol (N/A, 03/04/2016); Laparoscopic partial colectomy (N/A, 04/12/2016); and Portacath placement (N/A, 05/18/2016).  His family history includes Bone cancer in his maternal uncle; Cancer in his maternal uncle; Colon cancer in his sister and sister; Colon cancer (age of onset: 26) in his sister; Colon polyps in his brother, father, and mother; Dementia in his mother; Diabetes in his mother; Heart disease in his father; Heart failure in his father; Hyperlipidemia in his mother; Hypertension in his mother; Lung cancer in his paternal grandfather; Stroke in his maternal grandmother and sister. There is no history of Heart attack.  He  reports that he quit smoking about 41 years ago. His smoking  use included Cigarettes. He has a 0.20 pack-year smoking history. He has never used smokeless tobacco. He reports that he drinks alcohol. He reports that he uses drugs, including Cocaine and Marijuana.   Allergies  Allergen Reactions  . No Known Allergies     No current facility-administered medications on file prior to encounter.    Current Outpatient Prescriptions on File Prior to Encounter  Medication Sig  . acetaminophen (TYLENOL) 500 MG tablet Take 1,000 mg by mouth 2 (two) times daily.  Marland Kitchen albuterol (PROVENTIL HFA;VENTOLIN HFA) 108 (90 Base) MCG/ACT inhaler Inhale 2 puffs into the lungs every 6 (six) hours as needed for wheezing or shortness of breath.  Marland Kitchen aspirin EC 81 MG tablet Take 81 mg by mouth daily.  Marland Kitchen diltiazem (CARDIZEM) 120 MG tablet Take 120 mg by mouth daily.  Marland Kitchen docusate sodium (COLACE) 100 MG capsule Take 1 capsule (100 mg total) by mouth 2 (two) times daily. For constipation  . ferrous sulfate 325 (65 FE) MG tablet Take 1 tablet (325 mg total) by mouth 2 (two) times daily with a meal.  . furosemide (LASIX) 80 MG tablet TAKE ONE TABLET BY MOUTH TWICE DAILY  . hydrALAZINE (APRESOLINE) 50 MG tablet TAKE ONE TABLET BY MOUTH TWICE DAILY  . ipratropium (ATROVENT HFA) 17 MCG/ACT inhaler Inhale 2 puffs into the lungs every 6 (six) hours as needed for wheezing.   . lidocaine-prilocaine (EMLA) cream Apply to affected area once (Patient taking differently: Apply 1 application topically once. Apply to affected area once)  . lisinopril (PRINIVIL,ZESTRIL) 20 MG tablet TAKE ONE TABLET BY MOUTH ONCE DAILY  . LORazepam (  ATIVAN) 0.5 MG tablet Take 1 tablet (0.5 mg total) by mouth every 6 (six) hours as needed (Nausea or vomiting).  . metoprolol tartrate (LOPRESSOR) 25 MG tablet TAKE ONE TABLET BY MOUTH TWICE DAILY  . ondansetron (ZOFRAN) 8 MG tablet Take 1 tablet (8 mg total) by mouth 2 (two) times daily as needed for refractory nausea / vomiting. Start on day 3 after chemotherapy.  .  potassium chloride SA (K-DUR,KLOR-CON) 20 MEQ tablet Take 2 tablets (40 mEq total) by mouth daily.  . prochlorperazine (COMPAZINE) 10 MG tablet Take 1 tablet (10 mg total) by mouth every 6 (six) hours as needed (Nausea or vomiting). (Patient taking differently: Take 10 mg by mouth every 6 (six) hours as needed for nausea or vomiting. )  . sildenafil (REVATIO) 20 MG tablet TAKE TWO TO THREE TABLETS BY MOUTH ONCE DAILY AS NEEDED FOR SEXUAL ACTIVITY  . warfarin (COUMADIN) 4 MG tablet TAKE TWO TABLETS BY MOUTH ONCE DAILY OR  AS  DIRECTED  BY  THE  COUMADIN  CLINIC  . guaiFENesin-dextromethorphan (ROBITUSSIN DM) 100-10 MG/5ML syrup Take 5 mLs by mouth every 4 (four) hours as needed for cough. (Patient not taking: Reported on 06/16/2016)  . oxyCODONE (OXY IR/ROXICODONE) 5 MG immediate release tablet Take 1-2 tablets (5-10 mg total) by mouth every 6 (six) hours as needed for moderate pain, severe pain or breakthrough pain. (Patient not taking: Reported on 07/28/2016)    ROS: 12 point ROS negative except above  Vital signs: BP (!) 95/56   Pulse 89   Temp 97.7 F (36.5 C) (Rectal)   Resp (!) 31   Ht 5\' 10"  (1.778 m)   Wt 239 lb 6.7 oz (108.6 kg)   SpO2 97%   BMI 34.35 kg/m   Intake/output: I/O last 3 completed shifts: In: 527 [I.V.:440; IV Piggyback:87] Out: -   General: seen in ICU Neuro: Alert, mild confusion, follows commands, moves extremities Eyes: Pupils reactive ENT: No sinus tenderness, no stridor, dry mucosa Cardiac: irregular, no murmur Chest: no wheeze/rales Abd: mild distention, soft, decreased BS, non tender Ext: no edema Skin: no rashes   CMP Latest Ref Rng & Units 08/06/2016 08/06/2016 07/28/2016  Glucose 65 - 99 mg/dL 118(H) 122(H) 89  BUN 6 - 20 mg/dL 66(H) 75(H) 12.4  Creatinine 0.61 - 1.24 mg/dL 4.70(H) 4.65(H) 0.9  Sodium 135 - 145 mmol/L 133(L) 132(L) 137  Potassium 3.5 - 5.1 mmol/L 2.9(L) 2.9(L) 3.7  Chloride 101 - 111 mmol/L 105 102 -  CO2 22 - 32 mmol/L - 10(L)  26  Calcium 8.9 - 10.3 mg/dL - 8.5(L) 9.5  Total Protein 6.5 - 8.1 g/dL - 6.9 6.9  Total Bilirubin 0.3 - 1.2 mg/dL - 1.4(H) 1.36(H)  Alkaline Phos 38 - 126 U/L - 72 126  AST 15 - 41 U/L - 20 25  ALT 17 - 63 U/L - 20 18     CBC Latest Ref Rng & Units 08/06/2016 08/06/2016 07/28/2016  WBC 4.0 - 10.5 K/uL - 2.2(L) 3.8(L)  Hemoglobin 13.0 - 17.0 g/dL 17.3(H) 15.4 14.9  Hematocrit 39.0 - 52.0 % 51.0 43.9 45.4  Platelets 150 - 400 K/uL - 182 94(L)     ABG    Component Value Date/Time   PHART 7.275 (L) 08/06/2016 1932   PCO2ART 22.9 (L) 08/06/2016 1932   PO2ART 101 08/06/2016 1932   HCO3 10.3 (L) 08/06/2016 1932   TCO2 14 08/06/2016 1413   ACIDBASEDEF 14.8 (H) 08/06/2016 1932   O2SAT 96.2 08/06/2016 1932  CBG (last 3)  No results for input(s): GLUCAP in the last 72 hours.   Imaging: Dg Chest Port 1 View  Result Date: 08/06/2016 CLINICAL DATA:  Respiratory distress, hypoxia and hypotension. EXAM: PORTABLE CHEST 1 VIEW COMPARISON:  05/18/2016 FINDINGS: Stable appearance of left-sided Port-A-Cath with the catheter tip in the SVC. Lungs show stable chronic lung disease without edema, airspace consolidation, pleural fluid or pneumothorax. The heart size is at the upper limits of normal. IMPRESSION: Stable chronic lung disease.  No acute findings. Electronically Signed   By: Aletta Edouard M.D.   On: 08/06/2016 14:58     Studies: PSG 04/18/15 >> AHI 83.1, SpO2 low 85% Echo 07/09/15 >> EF 55 to 60%, mild MR, mod LA dilation, mod/severe RA dilation  Antibiotics: Cipro 2/03 >> Flagyl 2/03 >> Vancomycin 2/03 >>   Cultures: Urine 2/03 >> Blood 2/03 >> GI panel 2/03 >>  Lines/tubes: Port >  Events: 2/03 Admit   Summary: 66 yo male with N/V/D after recent chemotherapy for cecal cancer.  He presents with hypovolemic/septic shock, chemotherapy induced leukopenia, AKI, metabolic acidosis, and acute metabolic encephalopathy.  Assessment/plan:  Hypovolemic/septic shock 2nd to  acute gastroenteritis with hx of cecal cancer on chemotherapy. - continue IV fluids - cipro, flagyl - pressors to keep MAP > 65 - prn zofran  Lactic acidosis. AKI from hypovolemia and medications>> baseline creatinine 0.9 from 07/28/16. Hypokalemia. - f/u lactic acid - continue volume resuscitation - replace electrolytes - HCO3 in IV fluid - insert foley - check FeNA, renal u/s  Chemotherapy induced leukopenia. - cipro, flagyl, vancomycin - f/u CBC with diff  Hx of HTN, A fib on coumadin, chronic diastolic CHF. - hold outpt ASA, cardizem, lasix, hydralazine, lisinopril, lopressor, revatio  Hypercoagulable state 2nd to coumadin. - hold outpt coumadin - f/u INR  Hx of OSA intolerant of CPAP. - oxygen to keep SpO2 > 22%  Acute metabolic encephalopathy. - monitor mental status  DVT prophylaxis - SCDs SUP - Protonix Nutrition - NPO Goals of care - full code  CC time 39 minutes  Chesley Mires, MD [place] 08/06/2016, 8:51 PM Pager:  [phone] After 3pm call: [phone]

## 2016-08-06 NOTE — Progress Notes (Signed)
Pharmacy Antibiotic Note  Steven Barnett is a 66 y.o. male with hx of colon cancer currently undergoing chemotherapy treatment and hx of afib on warfarin PTA, presented to the ED on on 08/06/16 with c/o n/v/d. Patient was found to be in be in hypovolemic/septic shock currently on pressors.  To start flagyl, cipro and vancomycin for sepsis with suspicion for GI as source of infection.  - Tmax 99.6, wbc low, scr up 4.70 (crcl~19), LA up 3.77   Plan: - flagyl 500 mg IV q8h per MD - ciprofloxacin 400 mg IV q24h - vancomycin 2000 mg IV x1, will check random level in 1-2 days to assess clearance of drug before entering next dose - f/u renal function  ______________________________________  Height: 5\' 10"  (177.8 cm) Weight: 239 lb 6.7 oz (108.6 kg) IBW/kg (Calculated) : 73  Temp (24hrs), Avg:98.7 F (37.1 C), Min:97.7 F (36.5 C), Max:99.6 F (37.6 C)   Recent Labs Lab 08/06/16 1323 08/06/16 1332 08/06/16 1400 08/06/16 1413 08/06/16 1644  WBC 2.2*  --  1.1*  --   --   CREATININE 4.65*  --  4.32* 4.70*  --   LATICACIDVEN  --  2.82* 2.6*  --  3.77*    Estimated Creatinine Clearance: 19.6 mL/min (by C-G formula based on SCr of 4.7 mg/dL (H)).    Allergies  Allergen Reactions  . No Known Allergies     Thank you for allowing pharmacy to be a part of this patient's care.  Lynelle Doctor 08/06/2016 8:56 PM

## 2016-08-06 NOTE — Progress Notes (Signed)
eLink Physician-Brief Progress Note Patient Name: Steven Barnett DOB: 1951/07/01 MRN: [id number]   Date of Service  08/06/2016  HPI/Events of Note  Multiple issues: 1. Lactic Acid = 2.83 >> 3.77 and 2. ABG at 2:30 PM = 7.19/32.3/36.3/15.3. Last LVEF = 55% to 60%.  eICU Interventions  Will order: 1. ABG now.  2. 0.9 NaCl 1 liter IV over 1 hour now.  3. IV fluid: 0.9 NaCl to run IV at 125 mL/hour. 4. Continue to trend Lactic Acid.     Intervention Category Major Interventions: Acid-Base disturbance - evaluation and management  Deserea Bordley Eugene 08/06/2016, 7:29 PM

## 2016-08-06 NOTE — ED Notes (Signed)
Abnormal lactic acid level was given to Dr. Kathrynn Humble

## 2016-08-06 NOTE — ED Triage Notes (Signed)
Pt complaint of ongoing n/v/d since Sunday; pt last chemo 1/25. Pt here for decreased appetite and weakness.

## 2016-08-06 NOTE — Progress Notes (Signed)
Sepsis - Repeat Assessment  Performed at:    08/06/2016, 9:08 PM   Vitals     Blood pressure (!) 95/56, pulse 89, temperature 99.6 F (37.6 C), temperature source Axillary, resp. rate (!) 31, height 5\' 10"  (1.778 m), weight 239 lb 6.7 oz (108.6 kg), SpO2 97 %.  Heart:     Irregular rate and rhythm  Lungs:    CTA  Capillary Refill:   <2 sec  Peripheral Pulse:   Radial pulse palpable  Skin:     Normal Color   Chesley Mires, MD [place] 08/06/2016, 9:09 PM Pager:  [phone] After 3pm call: [phone]

## 2016-08-06 NOTE — ED Notes (Signed)
Dr. Kathrynn Humble has been notified of abnormal lactic acid results

## 2016-08-07 ENCOUNTER — Inpatient Hospital Stay (HOSPITAL_COMMUNITY): Payer: Medicaid Other

## 2016-08-07 DIAGNOSIS — N170 Acute kidney failure with tubular necrosis: Secondary | ICD-10-CM

## 2016-08-07 LAB — GASTROINTESTINAL PANEL BY PCR, STOOL (REPLACES STOOL CULTURE)
ASTROVIRUS: NOT DETECTED
Adenovirus F40/41: NOT DETECTED
CYCLOSPORA CAYETANENSIS: NOT DETECTED
Campylobacter species: NOT DETECTED
Cryptosporidium: NOT DETECTED
ENTAMOEBA HISTOLYTICA: NOT DETECTED
ENTEROAGGREGATIVE E COLI (EAEC): NOT DETECTED
Enteropathogenic E coli (EPEC): NOT DETECTED
Enterotoxigenic E coli (ETEC): NOT DETECTED
GIARDIA LAMBLIA: NOT DETECTED
Norovirus GI/GII: DETECTED — AB
Plesimonas shigelloides: NOT DETECTED
Rotavirus A: NOT DETECTED
SALMONELLA SPECIES: NOT DETECTED
SAPOVIRUS (I, II, IV, AND V): NOT DETECTED
SHIGA LIKE TOXIN PRODUCING E COLI (STEC): NOT DETECTED
SHIGELLA/ENTEROINVASIVE E COLI (EIEC): NOT DETECTED
VIBRIO CHOLERAE: NOT DETECTED
VIBRIO SPECIES: NOT DETECTED
Yersinia enterocolitica: NOT DETECTED

## 2016-08-07 LAB — COMPREHENSIVE METABOLIC PANEL
ALT: 14 U/L — ABNORMAL LOW (ref 17–63)
ANION GAP: 8 (ref 5–15)
AST: 15 U/L (ref 15–41)
Albumin: 2.3 g/dL — ABNORMAL LOW (ref 3.5–5.0)
Alkaline Phosphatase: 53 U/L (ref 38–126)
BUN: 71 mg/dL — ABNORMAL HIGH (ref 6–20)
CALCIUM: 7 mg/dL — AB (ref 8.9–10.3)
CHLORIDE: 111 mmol/L (ref 101–111)
CO2: 16 mmol/L — AB (ref 22–32)
CREATININE: 3.37 mg/dL — AB (ref 0.61–1.24)
GFR, EST AFRICAN AMERICAN: 21 mL/min — AB (ref >60)
GFR, EST NON AFRICAN AMERICAN: 18 mL/min — AB (ref >60)
Glucose, Bld: 144 mg/dL — ABNORMAL HIGH (ref 65–99)
Potassium: 2.5 mmol/L — CL (ref 3.5–5.1)
SODIUM: 135 mmol/L (ref 135–145)
Total Bilirubin: 1.8 mg/dL — ABNORMAL HIGH (ref 0.3–1.2)
Total Protein: 4.7 g/dL — ABNORMAL LOW (ref 6.5–8.1)

## 2016-08-07 LAB — BASIC METABOLIC PANEL
Anion gap: 8 (ref 5–15)
Anion gap: 6 (ref 5–15)
BUN: 55 mg/dL — AB (ref 6–20)
BUN: 53 mg/dL — AB (ref 6–20)
CHLORIDE: 113 mmol/L — AB (ref 101–111)
CHLORIDE: 111 mmol/L (ref 101–111)
CO2: 16 mmol/L — AB (ref 22–32)
CO2: 14 mmol/L — AB (ref 22–32)
CREATININE: 2.06 mg/dL — AB (ref 0.61–1.24)
CREATININE: 1.83 mg/dL — AB (ref 0.61–1.24)
Calcium: 7.6 mg/dL — ABNORMAL LOW (ref 8.9–10.3)
Calcium: 7 mg/dL — ABNORMAL LOW (ref 8.9–10.3)
GFR calc Af Amer: 38 mL/min — ABNORMAL LOW (ref >60)
GFR calc Af Amer: 43 mL/min — ABNORMAL LOW (ref >60)
GFR calc non Af Amer: 32 mL/min — ABNORMAL LOW (ref >60)
GFR calc non Af Amer: 37 mL/min — ABNORMAL LOW (ref >60)
Glucose, Bld: 117 mg/dL — ABNORMAL HIGH (ref 65–99)
Glucose, Bld: 108 mg/dL — ABNORMAL HIGH (ref 65–99)
Potassium: 2.9 mmol/L — ABNORMAL LOW (ref 3.5–5.1)
Potassium: 2.6 mmol/L — CL (ref 3.5–5.1)
Sodium: 137 mmol/L (ref 135–145)
Sodium: 131 mmol/L — ABNORMAL LOW (ref 135–145)

## 2016-08-07 LAB — CBC WITH DIFFERENTIAL/PLATELET
BASOS ABS: 0 10*3/uL (ref 0.0–0.1)
BASOS PCT: 0 %
Band Neutrophils: 0 %
Blasts: 0 %
EOS PCT: 0 %
Eosinophils Absolute: 0 10*3/uL (ref 0.0–0.7)
HCT: 34.1 % — ABNORMAL LOW (ref 39.0–52.0)
Hemoglobin: 12.2 g/dL — ABNORMAL LOW (ref 13.0–17.0)
LYMPHS ABS: 0.2 10*3/uL — AB (ref 0.7–4.0)
Lymphocytes Relative: 29 %
MCH: 26.6 pg (ref 26.0–34.0)
MCHC: 35.8 g/dL (ref 30.0–36.0)
MCV: 74.5 fL — AB (ref 78.0–100.0)
MONO ABS: 0.2 10*3/uL (ref 0.1–1.0)
MYELOCYTES: 0 %
Metamyelocytes Relative: 0 %
Monocytes Relative: 24 %
NEUTROS PCT: 47 %
NRBC: 3 /100{WBCs} — AB
Neutro Abs: 0.4 10*3/uL — ABNORMAL LOW (ref 1.7–7.7)
Other: 0 %
PLATELETS: 117 10*3/uL — AB (ref 150–400)
PROMYELOCYTES ABS: 0 %
RBC: 4.58 MIL/uL (ref 4.22–5.81)
RDW: 20.5 % — AB (ref 11.5–15.5)
WBC: 0.8 10*3/uL — AB (ref 4.0–10.5)

## 2016-08-07 LAB — PHOSPHORUS: PHOSPHORUS: 3.4 mg/dL (ref 2.5–4.6)

## 2016-08-07 LAB — BLOOD GAS, ARTERIAL
ACID-BASE DEFICIT: 11.7 mmol/L — AB (ref 0.0–2.0)
BICARBONATE: 12 mmol/L — AB (ref 20.0–28.0)
DRAWN BY: 308601
O2 CONTENT: 2 L/min
O2 SAT: 98.3 %
PATIENT TEMPERATURE: 37
PH ART: 7.352 (ref 7.350–7.450)
pCO2 arterial: 22.2 mmHg — ABNORMAL LOW (ref 32.0–48.0)
pO2, Arterial: 133 mmHg — ABNORMAL HIGH (ref 83.0–108.0)

## 2016-08-07 LAB — LACTIC ACID, PLASMA: Lactic Acid, Venous: 1.6 mmol/L (ref 0.5–1.9)

## 2016-08-07 LAB — MAGNESIUM
MAGNESIUM: 1.5 mg/dL — AB (ref 1.7–2.4)
Magnesium: 2.3 mg/dL (ref 1.7–2.4)

## 2016-08-07 LAB — GLUCOSE, CAPILLARY: Glucose-Capillary: 100 mg/dL — ABNORMAL HIGH (ref 65–99)

## 2016-08-07 LAB — INFLUENZA PANEL BY PCR (TYPE A & B)
Influenza A By PCR: NEGATIVE
Influenza B By PCR: NEGATIVE

## 2016-08-07 LAB — PROTIME-INR
INR: 2.13
PROTHROMBIN TIME: 24.2 s — AB (ref 11.4–15.2)

## 2016-08-07 LAB — CREATININE, URINE, RANDOM: Creatinine, Urine: 121.22 mg/dL

## 2016-08-07 LAB — SODIUM, URINE, RANDOM

## 2016-08-07 LAB — TROPONIN I

## 2016-08-07 MED ORDER — MAGNESIUM SULFATE 4 GM/100ML IV SOLN
4.0000 g | Freq: Once | INTRAVENOUS | Status: AC
  Administered 2016-08-07: 4 g via INTRAVENOUS
  Filled 2016-08-07: qty 100

## 2016-08-07 MED ORDER — AMIODARONE LOAD VIA INFUSION
150.0000 mg | Freq: Once | INTRAVENOUS | Status: AC
  Administered 2016-08-07: 150 mg via INTRAVENOUS
  Filled 2016-08-07: qty 83.34

## 2016-08-07 MED ORDER — AMIODARONE HCL IN DEXTROSE 360-4.14 MG/200ML-% IV SOLN
30.0000 mg/h | INTRAVENOUS | Status: DC
  Administered 2016-08-08: 30 mg/h via INTRAVENOUS
  Filled 2016-08-07 (×2): qty 200

## 2016-08-07 MED ORDER — VASOPRESSIN 20 UNIT/ML IV SOLN
0.0300 [IU]/min | INTRAVENOUS | Status: DC
  Administered 2016-08-07: 0.03 [IU]/min via INTRAVENOUS
  Filled 2016-08-07: qty 2

## 2016-08-07 MED ORDER — SODIUM CHLORIDE 0.9 % IV SOLN
30.0000 meq | Freq: Once | INTRAVENOUS | Status: AC
  Administered 2016-08-07: 30 meq via INTRAVENOUS
  Filled 2016-08-07: qty 15

## 2016-08-07 MED ORDER — SODIUM CHLORIDE 0.9 % IV BOLUS (SEPSIS)
500.0000 mL | Freq: Once | INTRAVENOUS | Status: AC
  Administered 2016-08-07: 500 mL via INTRAVENOUS

## 2016-08-07 MED ORDER — AMIODARONE HCL IN DEXTROSE 360-4.14 MG/200ML-% IV SOLN
60.0000 mg/h | INTRAVENOUS | Status: DC
Start: 2016-08-07 — End: 2016-08-08
  Administered 2016-08-07 (×2): 60 mg/h via INTRAVENOUS
  Filled 2016-08-07 (×2): qty 200

## 2016-08-07 MED ORDER — SODIUM CHLORIDE 0.9 % IV SOLN
Freq: Once | INTRAVENOUS | Status: AC
  Administered 2016-08-07: 03:00:00 via INTRAVENOUS
  Filled 2016-08-07: qty 1000

## 2016-08-07 MED ORDER — POTASSIUM CHLORIDE CRYS ER 20 MEQ PO TBCR
40.0000 meq | EXTENDED_RELEASE_TABLET | Freq: Once | ORAL | Status: AC
  Administered 2016-08-07: 40 meq via ORAL
  Filled 2016-08-07: qty 2

## 2016-08-07 MED ORDER — SODIUM CHLORIDE 0.9 % IV BOLUS (SEPSIS)
1000.0000 mL | Freq: Once | INTRAVENOUS | Status: AC
  Administered 2016-08-07: 1000 mL via INTRAVENOUS

## 2016-08-07 NOTE — Progress Notes (Signed)
[facility] Progress Note Patient Name: Steven Barnett DOB: 1951-03-03 MRN: [id number]   Date of Service  08/07/2016  HPI/Events of Note  K+ = 2.6, Mg++ = 2.4 and Creatinine = 1.82.  eICU Interventions  Will  Order: 1. Replace K+. 2. Send AM labs at 3 AM.     Intervention Category Major Interventions: Electrolyte abnormality - evaluation and management  Sommer,Steven Eugene 08/07/2016, 8:42 PM

## 2016-08-07 NOTE — Progress Notes (Signed)
eLink Physician-Brief Progress Note Patient Name: Steven Barnett DOB: [birth year] MRN: [id number]   Date of Service  08/07/2016  HPI/Events of Note  Hypomag and hypokalemia  eICU Interventions  Mag and potassium replaced     Intervention Category Intermediate Interventions: Electrolyte abnormality - evaluation and management  DETERDING,ELIZABETH 08/07/2016, 2:15 AM

## 2016-08-07 NOTE — Progress Notes (Signed)
eLink Physician-Brief Progress Note Patient Name: Steven Barnett DOB: [birth year] MRN: [id number]   Date of Service  08/07/2016  HPI/Events of Note  AFIB w/RVR - Ventricular rate = 120-140. BP = 80/39.  eICU Interventions  Will order: 1. Bolus with 0.9 NaCl 500 mL IV over 30 minutes now.  2. Amiodarone IV bolus and IV infusion. 3. BMP and Mg++ level now.  4. Cycle Troponin.     Intervention Category Major Interventions: Arrhythmia - evaluation and management  Sommer,Steven Cornelia Copa 08/07/2016, 7:31 PM

## 2016-08-07 NOTE — Progress Notes (Signed)
PCCM History and Physical Note  Admission date: 08/06/2016 Referring provider: Dr. Lenoria Chime, ER  CC: Nausea  HPI: 66 yo male presented with nausea, vomiting and diarrhea.  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  He had infusion catheter removed on 07/30/16.  He was doing well until about 2 days prior to admission.  He started getting nausea.  He has been throwing up bilious fluid.  He has also been getting loose stools.  He denies blood in his stool.  He has denies fever, sinus congestion, sore throat, chest pain, dyspnea.  Denies recent sick exposures.  His blood pressure was low and he was given IV fluid.  It remained low and pressor added through port.  Subjective / Interval Events:  Remains on norepi No diarrhea since admitted S/p 4L IVF Has been on bicarb gtt, on hold currently for his K replacement  Vital signs: BP (!) 92/52   Pulse (!) 104   Temp 98.6 F (37 C) (Axillary)   Resp (!) 24   Ht 5\' 10"  (1.778 m)   Wt 110.4 kg (243 lb 6.2 oz)   SpO2 98%   BMI 34.92 kg/m   Intake/output: I/O last 3 completed shifts: In: 3910.7 [I.V.:1508.7; Other:1500; IV TGGYIRSWN:462] Out: 850 [Urine:850]  General: obese ill appearing man, NAD Neuro: awake, interacts and follows commands, non-focal Eyes: OP dry, no lesions Cardiac: irreg irreg, rate 100's, no edema Chest: clear bilaterally Abd: obese, mild diffuse tenderness Ext: no deformities Skin: no rash   Recent Labs Lab 08/06/16 1323 08/06/16 1400 08/06/16 1413 08/07/16 0101  NA 132* 134* 133* 135  K 2.9* 2.6* 2.9* 2.5*  CL 102 105 105 111  CO2 10* 14*  --  16*  GLUCOSE 122* 113* 118* 144*  BUN 75* 74* 66* 71*  CREATININE 4.65* 4.32* 4.70* 3.37*  CALCIUM 8.5* 7.9*  --  7.0*  MG  --  1.8  --  1.5*  PHOS  --   --   --  3.4    Recent Labs Lab 08/06/16 1323 08/06/16 1400 08/06/16 1413 08/07/16 0101   HGB 15.4 14.5 17.3* 12.2*  HCT 43.9 41.2 51.0 34.1*  WBC 2.2* 1.1*  --  0.8*  PLT 182 160  --  117*    Recent Labs Lab 08/06/16 1413 08/06/16 1430 08/06/16 1932 08/07/16 0315  PHART  --   --  7.275* 7.352  PCO2ART  --   --  22.9* 22.2*  PO2ART  --   --  101 133*  HCO3  --  11.8* 10.3* 12.0*  TCO2 14  --   --   --   O2SAT  --  52.8 96.2 98.3    CBG (last 3)  No results for input(s): GLUCAP in the last 72 hours.   Imaging: US Renal  Result Date: 08/07/2016 CLINICAL DATA:  Acute renal failure. EXAM: RENAL / URINARY TRACT ULTRASOUND COMPLETE COMPARISON:  None. FINDINGS: Right Kidney: Length: 12.1 cm. 7 mm nonobstructive calculus is seen in upper pole. Echogenicity within normal limits. No mass or hydronephrosis visualized. Left Kidney: Length: 11.9 cm. 9 mm calculus is noted in midpole. Echogenicity within normal limits. No mass or hydronephrosis visualized. Bladder: Decompressed secondary to Foley catheter. IMPRESSION: Bilateral nonobstructive nephrolithiasis. No hydronephrosis is noted. Electronically Signed   By: Marijo Conception, M.D.   On: 08/07/2016 07:48   Dg Chest  Port 1 View  Result Date: 08/06/2016 CLINICAL DATA:  Respiratory distress, hypoxia and hypotension. EXAM: PORTABLE CHEST 1 VIEW COMPARISON:  05/18/2016 FINDINGS: Stable appearance of left-sided Port-A-Cath with the catheter tip in the SVC. Lungs show stable chronic lung disease without edema, airspace consolidation, pleural fluid or pneumothorax. The heart size is at the upper limits of normal. IMPRESSION: Stable chronic lung disease.  No acute findings. Electronically Signed   By: Aletta Edouard M.D.   On: 08/06/2016 14:58     Studies: PSG 04/18/15 >> AHI 83.1, SpO2 low 85% Echo 07/09/15 >> EF 55 to 60%, mild MR, mod LA dilation, mod/severe RA dilation  Antibiotics: Cipro 2/03 >> Flagyl 2/03 >> Vancomycin 2/03 >>   Cultures: Urine 2/03 >> Blood 2/03 >> GI panel 2/03 >>  Lines/tubes: Port  >  Events: 2/03 Admit   Summary: 66 yo male with N/V/D after recent chemotherapy for cecal cancer.  He presents with hypovolemic/septic shock, chemotherapy induced leukopenia, AKI, metabolic acidosis, and acute metabolic encephalopathy.  Assessment/plan:  Hypovolemic/septic shock 2nd to acute gastroenteritis with hx of cecal cancer on chemotherapy. - repeat IVF bolus x 1 today - add vasopressin 2/4 - abx as below - norepi - check am cortisol, ? Add stress steroids - consider CVC for CVP as we go forward if volume status is uncertain - prn zofran - flu test today, droplet isolation  Lactic acidosis, resolved AKI from hypovolemia and medications>> baseline creatinine 0.9 from 07/28/16. Improving.  Hypokalemia. - replace K+  - d/c bicarb gtt this am - check FeNA, renal u/s > pending  Chemotherapy induced leukopenia. - follow CBC - neutropenic precautions  Hx of HTN, PAH, A fib on coumadin, chronic diastolic CHF. - hold ASA, dilt, lasix, hydralazine, lisinopril, metop, sildenafil   Coagulopathy on coumadin, w sepsis - coumadin held - vit K given - follow coags  Hx of OSA intolerant of CPAP. - no CPAP, O2 for SpO2 > 52%  Acute metabolic encephalopathy, improved 2/4 am - follow MS with treatment underlying issues  DVT prophylaxis - SCDs SUP - Protonix Nutrition - try clear diet Goals of care - full code  Independent CC time 35 minutes.   Baltazar Apo, MD, PhD 08/07/2016, 9:52 AM [facility] [phone] or if no answer [phone]

## 2016-08-08 DIAGNOSIS — A0811 Acute gastroenteropathy due to Norwalk agent: Secondary | ICD-10-CM

## 2016-08-08 DIAGNOSIS — N179 Acute kidney failure, unspecified: Secondary | ICD-10-CM

## 2016-08-08 DIAGNOSIS — D61810 Antineoplastic chemotherapy induced pancytopenia: Secondary | ICD-10-CM

## 2016-08-08 DIAGNOSIS — D709 Neutropenia, unspecified: Secondary | ICD-10-CM

## 2016-08-08 DIAGNOSIS — E860 Dehydration: Secondary | ICD-10-CM

## 2016-08-08 DIAGNOSIS — I10 Essential (primary) hypertension: Secondary | ICD-10-CM

## 2016-08-08 DIAGNOSIS — G4733 Obstructive sleep apnea (adult) (pediatric): Secondary | ICD-10-CM

## 2016-08-08 DIAGNOSIS — I5032 Chronic diastolic (congestive) heart failure: Secondary | ICD-10-CM

## 2016-08-08 DIAGNOSIS — R197 Diarrhea, unspecified: Secondary | ICD-10-CM

## 2016-08-08 DIAGNOSIS — I4891 Unspecified atrial fibrillation: Secondary | ICD-10-CM

## 2016-08-08 DIAGNOSIS — C189 Malignant neoplasm of colon, unspecified: Secondary | ICD-10-CM

## 2016-08-08 LAB — PROTIME-INR
INR: 1.98
Prothrombin Time: 22.8 seconds — ABNORMAL HIGH (ref 11.4–15.2)

## 2016-08-08 LAB — URINE CULTURE: CULTURE: NO GROWTH

## 2016-08-08 LAB — BRAIN NATRIURETIC PEPTIDE: B NATRIURETIC PEPTIDE 5: 54.6 pg/mL (ref 0.0–100.0)

## 2016-08-08 LAB — BASIC METABOLIC PANEL
ANION GAP: 6 (ref 5–15)
BUN: 44 mg/dL — ABNORMAL HIGH (ref 6–20)
CHLORIDE: 115 mmol/L — AB (ref 101–111)
CO2: 15 mmol/L — ABNORMAL LOW (ref 22–32)
Calcium: 7.6 mg/dL — ABNORMAL LOW (ref 8.9–10.3)
Creatinine, Ser: 1.45 mg/dL — ABNORMAL HIGH (ref 0.61–1.24)
GFR calc non Af Amer: 49 mL/min — ABNORMAL LOW (ref >60)
GFR, EST AFRICAN AMERICAN: 57 mL/min — AB (ref >60)
Glucose, Bld: 93 mg/dL (ref 65–99)
POTASSIUM: 3 mmol/L — AB (ref 3.5–5.1)
SODIUM: 136 mmol/L (ref 135–145)

## 2016-08-08 LAB — CBC
HEMATOCRIT: 31.1 % — AB (ref 39.0–52.0)
HEMOGLOBIN: 10.8 g/dL — AB (ref 13.0–17.0)
MCH: 25.7 pg — ABNORMAL LOW (ref 26.0–34.0)
MCHC: 34.7 g/dL (ref 30.0–36.0)
MCV: 74 fL — ABNORMAL LOW (ref 78.0–100.0)
Platelets: 131 10*3/uL — ABNORMAL LOW (ref 150–400)
RBC: 4.2 MIL/uL — AB (ref 4.22–5.81)
RDW: 21.1 % — ABNORMAL HIGH (ref 11.5–15.5)
WBC: 1.8 10*3/uL — AB (ref 4.0–10.5)

## 2016-08-08 LAB — APTT: APTT: 48 s — AB (ref 24–36)

## 2016-08-08 LAB — TROPONIN I
Troponin I: <0.03 ng/mL (ref <0.03)
Troponin I: <0.03 ng/mL (ref <0.03)

## 2016-08-08 LAB — VANCOMYCIN, RANDOM: VANCOMYCIN RM: 9

## 2016-08-08 LAB — MAGNESIUM: MAGNESIUM: 2.3 mg/dL (ref 1.7–2.4)

## 2016-08-08 LAB — CORTISOL-AM, BLOOD: Cortisol - AM: 13.9 ug/dL (ref 6.7–22.6)

## 2016-08-08 MED ORDER — POTASSIUM CHLORIDE CRYS ER 20 MEQ PO TBCR
30.0000 meq | EXTENDED_RELEASE_TABLET | ORAL | Status: AC
  Administered 2016-08-08 (×2): 30 meq via ORAL
  Filled 2016-08-08 (×2): qty 1

## 2016-08-08 MED ORDER — WARFARIN - PHARMACIST DOSING INPATIENT: Freq: Every day | Status: DC

## 2016-08-08 MED ORDER — VANCOMYCIN HCL 10 G IV SOLR
1250.0000 mg | INTRAVENOUS | Status: DC
  Administered 2016-08-08: 1250 mg via INTRAVENOUS
  Filled 2016-08-08: qty 1250

## 2016-08-08 MED ORDER — CIPROFLOXACIN IN D5W 400 MG/200ML IV SOLN
400.0000 mg | Freq: Two times a day (BID) | INTRAVENOUS | Status: DC
  Administered 2016-08-08 – 2016-08-09 (×3): 400 mg via INTRAVENOUS
  Filled 2016-08-08 (×3): qty 200

## 2016-08-08 MED ORDER — WARFARIN SODIUM 4 MG PO TABS
4.0000 mg | ORAL_TABLET | Freq: Once | ORAL | Status: AC
Start: 2016-08-08 — End: 2016-08-08
  Administered 2016-08-08: 4 mg via ORAL
  Filled 2016-08-08: qty 1

## 2016-08-08 MED ORDER — TBO-FILGRASTIM 480 MCG/0.8ML ~~LOC~~ SOSY
480.0000 ug | PREFILLED_SYRINGE | Freq: Once | SUBCUTANEOUS | Status: AC
  Administered 2016-08-08: 480 ug via SUBCUTANEOUS
  Filled 2016-08-08: qty 0.8

## 2016-08-08 MED ORDER — DILTIAZEM HCL 30 MG PO TABS
30.0000 mg | ORAL_TABLET | Freq: Three times a day (TID) | ORAL | Status: DC
  Administered 2016-08-08 – 2016-08-09 (×6): 30 mg via ORAL
  Filled 2016-08-08 (×7): qty 1

## 2016-08-08 MED ORDER — METOPROLOL TARTRATE 25 MG PO TABS
25.0000 mg | ORAL_TABLET | Freq: Two times a day (BID) | ORAL | Status: DC
  Administered 2016-08-08 – 2016-08-10 (×5): 25 mg via ORAL
  Filled 2016-08-08 (×5): qty 1

## 2016-08-08 MED ORDER — BOOST PLUS PO LIQD
237.0000 mL | Freq: Two times a day (BID) | ORAL | Status: DC
  Administered 2016-08-08: 237 mL via ORAL
  Filled 2016-08-08 (×3): qty 237

## 2016-08-08 NOTE — Progress Notes (Signed)
CRITICAL VALUE ALERT  Critical value received:  Potassium 2.6  Date of notification:  08/07/16  Time of notification:  2036  Critical value read back:Yes.    Nurse who received alert:  Duard Larsen, RN  MD notified (1st page):  Dr. Oletta Darter  Time of first page: 2040  Potassium replacement ordered.

## 2016-08-08 NOTE — Progress Notes (Signed)
ANTICOAGULATION CONSULT NOTE - Initial Consult  Pharmacy Consult for Warfarin Indication: atrial fibrillation  Allergies  Allergen Reactions  . No Known Allergies     Patient Measurements: Height: 5\' 10"  (177.8 cm) Weight: 251 lb 15.8 oz (114.3 kg) IBW/kg (Calculated) : 73  Vital Signs: Temp: 97.9 F (36.6 C) (02/05 0800) Temp Source: Axillary (02/05 0800) BP: 109/69 (02/05 1100) Pulse Rate: 111 (02/05 1100)  Labs:  Recent Labs  08/06/16 1400 08/06/16 1413 08/07/16 0101 08/07/16 1824 08/07/16 1948 08/08/16 0133 08/08/16 0500 08/08/16 0817  HGB 14.5 17.3* 12.2*  --   --   --  10.8*  --   HCT 41.2 51.0 34.1*  --   --   --  31.1*  --   PLT 160  --  117*  --   --   --  131*  --   APTT  --   --   --   --   --   --  48*  --   LABPROT >90.0*  --  24.2*  --   --   --  22.8*  --   INR >10.00*  --  2.13  --   --   --  1.98  --   CREATININE 4.32* 4.70* 3.37* 2.06* 1.83*  --  1.45*  --   TROPONINI  --   --   --   --  <0.03 <0.03  --  <0.03    Estimated Creatinine Clearance: 65.2 mL/min (by C-G formula based on SCr of 1.45 mg/dL (H)).   Medical History: Past Medical History:  Diagnosis Date  . Anemia   . Arthralgia of both knees   . Arthritis    "right knee" (10/01/2015)  . Atrial fibrillation ([place])   . Cancer (HCC)    STAGE 1 COLON CANCER  . Chronic anticoagulation 2010   Coumadin  . Chronic diastolic CHF (congestive heart failure), NYHA class 2 ([place])   . Hypertension   . OSA (obstructive sleep apnea) 2016   "couldn't take the mask during the testing" (10/01/2015)  . Permanent atrial fibrillation ([place]) 2010  . Pneumonia 3/29/2017and june 2017      Assessment: 66 y.o. male presented to the ED on on 08/06/16 with c/o n/v/d. Patient was found to be in be in hypovolemic/septic shock. On warfarin PTA for h/o atrial fibrillation. Held on admission due to supratherapeutic INR. INR now 1.98, resume warfarin.  Home warfarin dose reported as 8 mg Sun-Wed, 4 mg Thu-Sat.   Last dose 2/2. Drug-drug interaction: Cipro Hgb down to 10.8 since admission. No bleeding documented. Platelets low at 131K.  Goal of Therapy:  INR 2-3   Plan:  Warfarin 4 mg today. Daily INR.  Hershal Coria 08/08/2016,11:22 AM

## 2016-08-08 NOTE — Progress Notes (Signed)
[facility] ICU Electrolyte Replacement Protocol  Patient Name: Steven Barnett DOB: 07/30/1950 MRN: [id number]  Date of Service  08/08/2016   HPI/Events of Note    Recent Labs Lab 08/06/16 1400 08/06/16 1413 08/07/16 0101 08/07/16 1824 08/07/16 1948 08/08/16 0500  NA 134* 133* 135 137 131* 136  K 2.6* 2.9* 2.5* 2.9* 2.6* 3.0*  CL 105 105 111 113* 111 115*  CO2 14*  --  16* 16* 14* 15*  GLUCOSE 113* 118* 144* 117* 108* 93  BUN 74* 66* 71* 55* 53* 44*  CREATININE 4.32* 4.70* 3.37* 2.06* 1.83* 1.45*  CALCIUM 7.9*  --  7.0* 7.6* 7.0* 7.6*  MG 1.8  --  1.5*  --  2.3 2.3  PHOS  --   --  3.4  --   --   --     Estimated Creatinine Clearance: 65.2 mL/min (by C-G formula based on SCr of 1.45 mg/dL (H)).  Intake/Output      02/04 0701 - 02/05 0700   P.O. 820   I.V. (mL/kg) 804.1 (7)   IV Piggyback 2448.3   Total Intake(mL/kg) 4072.4 (35.6)   Urine (mL/kg/hr) 1925 (0.7)   Stool 0 (0)   Total Output 1925   Net +2147.4       Stool Occurrence 1 x    - I/O DETAILED x24h    Total I/O In: 5686 [I.V.:339.7; IV Piggyback:1348.3] Out: 1100 [Urine:1100] - I/O THIS SHIFT    ASSESSMENT   eICURN Interventions  K+replaced using ICU protocol   ASSESSMENT: [place], Steven Barnett 08/08/2016, 6:13 AM

## 2016-08-08 NOTE — Progress Notes (Signed)
Date:  August 08, 2016 Chart reviewed for concurrent status and case management needs. Will continue to follow patient progress. [place] by outside company. Discharge Planning: following for needs Expected discharge date: [id number] Velva Harman, BSN, [place], [place]

## 2016-08-08 NOTE — Progress Notes (Signed)
Steven Barnett   DOB:[birth year]   GU#:[id number]   ZDG#:[id number]  Oncology follow-up note  Subjective: Patient is well-known to me, under my care for his adjuvant chemotherapy for stage III colon cancer. He received cycle 5 chemotherapy on 1/25,and develop worsening diarrhea last week. He felt it was GI flu, because both his wife and son had similar diarrhea. He was admitted to hospital yesterday for AKI and due to severe dehydration, and required pressor. He feels much better today. He was sitting in the chair eating lunch when I saw him in the ICU.   Objective:  Vitals:   08/08/16 1600 08/08/16 1700  BP: 106/65 112/77  Pulse:    Resp: 18   Temp:      Body mass index is 36.16 kg/m.  Intake/Output Summary (Last 24 hours) at 08/08/16 1726 Last data filed at 08/08/16 1647  Gross per 24 hour  Intake          3241.54 ml  Output             2350 ml  Net           891.54 ml     Sclerae unicteric  Oropharynx clear  No peripheral adenopathy  Lungs clear -- no rales or rhonchi  Heart regular rate and rhythm  Abdomen benign  MSK no focal spinal tenderness, no peripheral edema  Neuro nonfocal   CBG (last 3)   Recent Labs  08/07/16 1948  GLUCAP 100*     Labs:  Lab Results  Component Value Date   WBC 1.8 (L) 08/08/2016   HGB 10.8 (L) 08/08/2016   HCT 31.1 (L) 08/08/2016   MCV 74.0 (L) 08/08/2016   PLT 131 (L) 08/08/2016   NEUTROABS 0.4 (L) 08/07/2016    Urine Studies No results for input(s): UHGB, CRYS in the last 72 hours.  Invalid input(s): UACOL, UAPR, USPG, UPH, UTP, UGL, UKET, UBIL, UNIT, UROB, [place], UEPI, UWBC, [place], [place], [place], [place], [place]  Basic Metabolic Panel:  Recent Labs Lab 08/06/16 1400 08/06/16 1413 08/07/16 0101 08/07/16 1824 08/07/16 1948 08/08/16 0500  NA 134* 133* 135 137 131* 136  K 2.6* 2.9* 2.5* 2.9* 2.6* 3.0*  CL 105 105 111 113* 111 115*  CO2 14*  --  16* 16* 14* 15*  GLUCOSE 113* 118* 144* 117* 108* 93  BUN 74* 66* 71* 55* 53* 44*   CREATININE 4.32* 4.70* 3.37* 2.06* 1.83* 1.45*  CALCIUM 7.9*  --  7.0* 7.6* 7.0* 7.6*  MG 1.8  --  1.5*  --  2.3 2.3  PHOS  --   --  3.4  --   --   --    GFR Estimated Creatinine Clearance: 65.2 mL/min (by C-G formula based on SCr of 1.45 mg/dL (H)). Liver Function Tests:  Recent Labs Lab 08/06/16 1323 08/07/16 0101  AST 20 15  ALT 20 14*  ALKPHOS 72 53  BILITOT 1.4* 1.8*  PROT 6.9 4.7*  ALBUMIN 3.2* 2.3*   No results for input(s): LIPASE, AMYLASE in the last 168 hours. No results for input(s): AMMONIA in the last 168 hours. Coagulation profile  Recent Labs Lab 08/06/16 1323 08/06/16 1400 08/07/16 0101 08/08/16 0500  INR >10.00* >10.00* 2.13 1.98    CBC:  Recent Labs Lab 08/06/16 1323 08/06/16 1400 08/06/16 1413 08/07/16 0101 08/08/16 0500  WBC 2.2* 1.1*  --  0.8* 1.8*  NEUTROABS 0.9*  --   --  0.4*  --   HGB 15.4 14.5 17.3* 12.2*  10.8*  HCT 43.9 41.2 51.0 34.1* 31.1*  MCV 74.8* 75.2*  --  74.5* 74.0*  PLT 182 160  --  117* 131*   Cardiac Enzymes:  Recent Labs Lab 08/07/16 1948 08/08/16 0133 08/08/16 0817  TROPONINI <0.03 <0.03 <0.03   BNP: Invalid input(s): POCBNP CBG:  Recent Labs Lab 08/07/16 1948  GLUCAP 100*   D-Dimer No results for input(s): DDIMER in the last 72 hours. Hgb A1c No results for input(s): HGBA1C in the last 72 hours. Lipid Profile No results for input(s): CHOL, HDL, LDLCALC, TRIG, CHOLHDL, LDLDIRECT in the last 72 hours. Thyroid function studies No results for input(s): TSH, T4TOTAL, T3FREE, THYROIDAB in the last 72 hours.  Invalid input(s): FREET3 Anemia work up No results for input(s): VITAMINB12, FOLATE, FERRITIN, TIBC, IRON, RETICCTPCT in the last 72 hours. Microbiology Recent Results (from the past 240 hour(s))  Culture, blood (Routine x 2)     Status: None (Preliminary result)   Collection Time: 08/06/16  1:23 PM  Result Value Ref Range Status   Specimen Description BLOOD LEFT PORTA CATH  Final   Special  Requests BOTTLES DRAWN AEROBIC AND ANAEROBIC 5 CC EACH  Final   Culture   Final    NO GROWTH 2 DAYS Performed at [facility], [address] [place], [place] [address]    Report Status PENDING  Incomplete  Culture, blood (Routine x 2)     Status: None (Preliminary result)   Collection Time: 08/06/16  1:44 PM  Result Value Ref Range Status   Specimen Description BLOOD RIGHT ANTECUBITAL  Final   Special Requests BOTTLES DRAWN AEROBIC AND ANAEROBIC 5 CC EACH  Final   Culture   Final    NO GROWTH 2 DAYS Performed at [facility], [place] [address], [place], [place] [address]    Report Status PENDING  Incomplete  MRSA PCR Screening     Status: None   Collection Time: 08/06/16  6:12 PM  Result Value Ref Range Status   MRSA by PCR NEGATIVE NEGATIVE Final    Comment:        The GeneXpert MRSA Assay (FDA approved for NASAL specimens only), is one component of a comprehensive MRSA colonization surveillance program. It is not intended to diagnose MRSA infection nor to guide or monitor treatment for MRSA infections.   Urine culture     Status: None   Collection Time: 08/06/16  9:40 PM  Result Value Ref Range Status   Specimen Description URINE, CATHETERIZED  Final   Special Requests NONE  Final   Culture   Final    NO GROWTH Performed at [facility], [address] [place], [place] [address]    Report Status 08/08/2016 FINAL  Final  Gastrointestinal Panel by PCR , Stool     Status: Abnormal   Collection Time: 08/07/16 10:42 AM  Result Value Ref Range Status   Campylobacter species NOT DETECTED NOT DETECTED Final   Plesimonas shigelloides NOT DETECTED NOT DETECTED Final   Salmonella species NOT DETECTED NOT DETECTED Final   Yersinia enterocolitica NOT DETECTED NOT DETECTED Final   Vibrio species NOT DETECTED NOT DETECTED Final   Vibrio cholerae NOT DETECTED NOT DETECTED Final   Enteroaggregative E coli (EAEC) NOT DETECTED NOT DETECTED Final   Enteropathogenic  E coli (EPEC) NOT DETECTED NOT DETECTED Final   Enterotoxigenic E coli (ETEC) NOT DETECTED NOT DETECTED Final   Shiga like toxin producing E coli (STEC) NOT DETECTED NOT DETECTED  Final   Shigella/Enteroinvasive E coli (EIEC) NOT DETECTED NOT DETECTED Final   Cryptosporidium NOT DETECTED NOT DETECTED Final   Cyclospora cayetanensis NOT DETECTED NOT DETECTED Final   Entamoeba histolytica NOT DETECTED NOT DETECTED Final   Giardia lamblia NOT DETECTED NOT DETECTED Final   Adenovirus F40/41 NOT DETECTED NOT DETECTED Final   Astrovirus NOT DETECTED NOT DETECTED Final   Norovirus GI/GII DETECTED (A) NOT DETECTED Final    Comment: RESULT CALLED TO, READ BACK BY AND VERIFIED WITH: [facility] GRAHAM AT 2348 08/21/16.PMH    Rotavirus A NOT DETECTED NOT DETECTED Final   Sapovirus (I, II, IV, and V) NOT DETECTED NOT DETECTED Final      Studies:  US Renal  Result Date: Aug 21, 2016 CLINICAL DATA:  Acute renal failure. EXAM: RENAL / URINARY TRACT ULTRASOUND COMPLETE COMPARISON:  None. FINDINGS: Right Kidney: Length: 12.1 cm. 7 mm nonobstructive calculus is seen in upper pole. Echogenicity within normal limits. No mass or hydronephrosis visualized. Left Kidney: Length: 11.9 cm. 9 mm calculus is noted in midpole. Echogenicity within normal limits. No mass or hydronephrosis visualized. Bladder: Decompressed secondary to Foley catheter. IMPRESSION: Bilateral nonobstructive nephrolithiasis. No hydronephrosis is noted. Electronically Signed   By: Marijo Conception, M.D.   On: 08-21-16 07:48    Assessment: 66 y.o. with stage III colon cancer, on adjuvant chemo, admitted for severe diarrhea, dehydration, AKI    1. Septic shock due to acute gastroenteritis, resolved now  2. Severe diarrhea, (+) Norovirus, resolved  3. AKI secondary to diarrhea and dehydration, much improved  4. Stage III colon cancer, on adjuvant chemotherapy FOLFOX  5. Hypertension  6. A. fib on Coumadin  7. Chronic diastolic CHF  8. OSA, on  nocturnal CPAP  9. Pancytopenia, from chemotherapy  Plan:  -his overall condition is much improved, appreciated ICU team care -I will cancel his next (last) cycle chemo which is scheduled for 2/8 -He is quite a neutropenia, I'll start him on Granix 445mcg daily until [place] close to 1.5 -continue other supportive care  -I will follow up    [name], MD 08/08/2016  5:26 PM

## 2016-08-08 NOTE — Care Management Note (Signed)
Case Management Note  Patient Details  Name: Steven Barnett MRN: [id number] Date of Birth: [birth year]  Subjective/Objective:      Sepsis, norovirus, a.fib with rvr.              Action/Plan: home when stableDate:  August 08, 2016 Chart reviewed for concurrent status and case management needs. Will continue to follow patient progress. Discharge Planning: following for needs Expected discharge date: [id number] Velva Harman, BSN, [place], [place]    Expected Discharge Date:                  Expected Discharge Plan:  Home/Self Care  In-House Referral:     Discharge planning Services     Post Acute Care Choice:    Choice offered to:     DME Arranged:    DME Agency:     HH Arranged:    [place] Agency:     Status of Service:  In process, will continue to follow  If discussed at Long Length of Stay Meetings, dates discussed:    Additional Comments:  Leeroy Cha, RN 08/08/2016, 12:26 PM

## 2016-08-08 NOTE — Progress Notes (Signed)
Pharmacy Antibiotic Note  Steven Barnett is a 66 y.o. male with hx of colon cancer currently undergoing chemotherapy treatment and hx of afib on warfarin PTA, presented to the ED on on 08/06/16 with c/o n/v/d. Patient was found to be in be in hypovolemic/septic shock currently on pressors.  To start flagyl, cipro and vancomycin for sepsis with suspicion for GI as source of infection.  Today, 08/08/2016: - Day #3 antibiotics - SCr continues to improve - WBC low as expected following chemo - Random vancomycin level = 9 mcg/ml - GI panel was positive for norovirus  Plan: - Patient clearing vancomycin with improving renal function. Resume vancomycin at dose of 1250 mg IV q24h. - Change Cipro to 400 mg IV q12h for CrCl>30 ml/min. - No adjustments needed for Flagyl 500 mg IV q8h. - Question need to continue empiric antibiotics?  Cultures negative and patient is positive for norovirus causing GI symptoms.  ______________________________________  Height: 5\' 10"  (177.8 cm) Weight: 251 lb 15.8 oz (114.3 kg) IBW/kg (Calculated) : 73  Temp (24hrs), Avg:97.5 F (36.4 C), Min:97.1 F (36.2 C), Max:97.9 F (36.6 C)   Recent Labs Lab 08/06/16 1323 08/06/16 1332 08/06/16 1400 08/06/16 1413 08/06/16 1644 08/07/16 0101 08/07/16 1824 08/07/16 1948 08/08/16 0500  WBC 2.2*  --  1.1*  --   --  0.8*  --   --  1.8*  CREATININE 4.65*  --  4.32* 4.70*  --  3.37* 2.06* 1.83* 1.45*  LATICACIDVEN  --  2.82* 2.6*  --  3.77* 1.6  --   --   --   VANCORANDOM  --   --   --   --   --   --   --   --  9    Estimated Creatinine Clearance: 65.2 mL/min (by C-G formula based on SCr of 1.45 mg/dL (H)).    Allergies  Allergen Reactions  . No Known Allergies    Antimicrobials this admission:  2/3 vanc>>  2/3 cipro >>  2/3 flagyl>>  Dose adjustments this admission:  2/5 random vanc level = 9 mcg/ml  Microbiology results:  2/3 BCx x2: ngtd 2/3 MRSA PCR: neg 2/3 UCx: NGF 2/4 GI panel: +norovirus 2/4  influenza panel: neg  Thank you for allowing pharmacy to be a part of this patient's care.  Hershal Coria 08/08/2016 9:29 AM

## 2016-08-08 NOTE — Progress Notes (Signed)
Initial Nutrition Assessment  DOCUMENTATION CODES:   Obesity unspecified  INTERVENTION:   Provide Boost Plus BID, each supplement provides 360 kcal and 14 grams of protein. Encourage PO intake Reviewed strategies to help with taste changes RD to continue to monitor  NUTRITION DIAGNOSIS:   Increased nutrient needs related to cancer and cancer related treatments as evidenced by estimated needs.  GOAL:   Patient will meet greater than or equal to 90% of their needs  MONITOR:   PO intake, Supplement acceptance, Labs, Weight trends, I & O's  REASON FOR ASSESSMENT:   Malnutrition Screening Tool    ASSESSMENT:   66 yo male presented with nausea, vomiting and diarrhea.  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  He had infusion catheter removed on 07/30/16.  He was doing well until about 2 days prior to admission.  He started getting nausea.  He has been throwing up bilious fluid.  He has also been getting loose stools.    Patient in room with no family at bedside. Pt has been consuming and tolerating clear liquids. Feels ready to have solid food. Diet will advance to heart healthy diet per RN. Pt reports limiting his portions and baking/grilling his foods to help him lose weight. Pt states he doesn't eat meat but does consume chicken and fish. Reviewed heart healthy options with patient. Also, encouraged protein foods with every meal to preserve muscle mass.  Pt requests Boost supplements room temperature since he is receiving FOLFOX chemotherapy. RD will order. Pt denies chewing or swallowing difficulties. Pt does report taste changes, reviewed strategies to help with altered tastes.  Per chart review, pt's weight has remained stable. Nutrition focused physical exam shows no sign of depletion of muscle mass or body fat.  Medications: IV Protonix daily Labs reviewed: Low  K Mg WNL GFR: 57  Diet Order:  Diet Heart Room service appropriate? Yes; Fluid consistency: Thin  Skin:  Reviewed, no issues  Last BM:  2/4  Height:   Ht Readings from Last 1 Encounters:  08/06/16 5\' 10"  (1.778 m)    Weight:   Wt Readings from Last 1 Encounters:  08/08/16 251 lb 15.8 oz (114.3 kg)    Ideal Body Weight:  75.5 kg  BMI:  Body mass index is 36.16 kg/m.  Estimated Nutritional Needs:   Kcal:  2330-0762  Protein:  115-125g  Fluid:  2L/day  EDUCATION NEEDS:   Education needs addressed  Clayton Bibles, MS, RD, LDN Pager: [phone] After Hours Pager: [phone]

## 2016-08-08 NOTE — Progress Notes (Signed)
PCCM History and Physical Note  Admission date: 08/06/2016 Referring provider: Dr. 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  He had infusion catheter removed on 07/30/16.   Subjective / Interval Events:  Off pressors No diarrhea since admitted Off  bicarb gtt, started on amio overnight for AF-RVR  Vital signs: BP 125/61   Pulse (!) 105   Temp 97.9 F (36.6 C) (Axillary)   Resp 18   Ht 5\' 10"  (1.778 m)   Wt 251 lb 15.8 oz (114.3 kg)   SpO2 100%   BMI 36.16 kg/m   Intake/output: I/O last 3 completed shifts: In: 7456.2 [P.O.:820; I.V.:1872.9; Other:1500; IV Piggyback:3263.3] Out: 2775 [Urine:2775]  General: obese ill appearing man, NAD Neuro: awake, interacts and follows commands, non-focal Eyes: OP dry, no lesions Cardiac: irreg irreg, rate 110's, no edema Chest: clear bilaterally Abd: obese,no tenderness Ext: no deformities Skin: no rash   Recent Labs Lab 08/06/16 1400 08/06/16 1413 08/07/16 0101 08/07/16 1824 08/07/16 1948 08/08/16 0500  NA 134* 133* 135 137 131* 136  K 2.6* 2.9* 2.5* 2.9* 2.6* 3.0*  CL 105 105 111 113* 111 115*  CO2 14*  --  16* 16* 14* 15*  GLUCOSE 113* 118* 144* 117* 108* 93  BUN 74* 66* 71* 55* 53* 44*  CREATININE 4.32* 4.70* 3.37* 2.06* 1.83* 1.45*  CALCIUM 7.9*  --  7.0* 7.6* 7.0* 7.6*  MG 1.8  --  1.5*  --  2.3 2.3  PHOS  --   --  3.4  --   --   --     Recent Labs Lab 08/06/16 1400 08/06/16 1413 08/07/16 0101 08/08/16 0500  HGB 14.5 17.3* 12.2* 10.8*  HCT 41.2 51.0 34.1* 31.1*  WBC 1.1*  --  0.8* 1.8*  PLT 160  --  117* 131*    Recent Labs Lab 08/06/16 1413 08/06/16 1430 08/06/16 1932 08/07/16 0315  PHART  --   --  7.275* 7.352  PCO2ART  --   --   22.9* 22.2*  PO2ART  --   --  101 133*  HCO3  --  11.8* 10.3* 12.0*  TCO2 14  --   --   --   O2SAT  --  52.8 96.2 98.3    CBG (last 3)   Recent Labs  08/07/16 1948  GLUCAP 100*     Imaging: US Renal  Result Date: 08/07/2016 CLINICAL DATA:  Acute renal failure. EXAM: RENAL / URINARY TRACT ULTRASOUND COMPLETE COMPARISON:  None. FINDINGS: Right Kidney: Length: 12.1 cm. 7 mm nonobstructive calculus is seen in upper pole. Echogenicity within normal limits. No mass or hydronephrosis visualized. Left Kidney: Length: 11.9 cm. 9 mm calculus is noted in midpole. Echogenicity within normal limits. No mass or hydronephrosis visualized. Bladder: Decompressed secondary to Foley catheter. IMPRESSION: Bilateral nonobstructive nephrolithiasis. No hydronephrosis is noted. Electronically Signed   By: Marijo Conception, M.D.   On: 08/07/2016 07:48   Dg Chest Port 1 View  Result Date: 08/06/2016 CLINICAL DATA:  Respiratory distress, hypoxia and hypotension. EXAM: PORTABLE CHEST 1 VIEW COMPARISON:  05/18/2016 FINDINGS: Stable appearance of left-sided Port-A-Cath with the catheter tip in the SVC. Lungs show stable chronic lung disease without edema, airspace consolidation, pleural  fluid or pneumothorax. The heart size is at the upper limits of normal. IMPRESSION: Stable chronic lung disease.  No acute findings. Electronically Signed   By: Aletta Edouard M.D.   On: 08/06/2016 14:58     Studies: PSG 04/18/15 >> AHI 83.1, SpO2 low 85% Echo 07/09/15 >> EF 55 to 60%, mild MR, mod LA dilation, mod/severe RA dilation  Antibiotics: Cipro 2/03 >> Flagyl 2/03 >> Vancomycin 2/03 >> 2/5  Cultures: Urine 2/03 >>ng Blood 2/03 >>ng GI panel 2/03 >> noroviris  Lines/tubes: Port >  Events: 2/03 Admit   Summary: 66 yo male with N/V/D after recent chemotherapy for cecal cancer.  He presents with hypovolemic/septic shock, chemotherapy induced leukopenia, AKI, metabolic acidosis, and acute metabolic  encephalopathy.  Assessment/plan:  Hypovolemic/septic shock 2nd to acute gastroenteritis with hx of cecal cancer on chemotherapy. -resolved -- am cortisol -14 - prn zofran   Lactic acidosis, resolved AKI from hypovolemia and medications>> baseline creatinine 0.9 from 07/28/16. Improving.  - renal u/s >> BL non obstructive stones Hypokalemia. - replace K+     Chemotherapy induced leukopenia - resolving - follow CBC - neutropenic precautions  Hx of HTN, PAH, A fib on coumadin, chronic diastolic CHF. - hold ASA, dilt, lasix, hydralazine, lisinopril,sildenafil  -started on amio for RVR overnight - can dc -restart lopressor 25 bid, add cardizem soon  Coagulopathy on coumadin, w sepsis - coumadin restart - follow coags  Hx of OSA intolerant of CPAP. - no CPAP, O2 for SpO2 > 98%  Acute metabolic encephalopathy,resolved -PT/OT  DVT prophylaxis - SCDs SUP - Protonix Nutrition - try clear diet Goals of care - full code Transfer to SDU & to triad 2/6  My cc time x 51m  Kara Mead MD. FCCP. [place] Pager [phone] If no response call [phone]    08/08/2016, 11:05 AM

## 2016-08-09 DIAGNOSIS — D6859 Other primary thrombophilia: Secondary | ICD-10-CM

## 2016-08-09 DIAGNOSIS — D702 Other drug-induced agranulocytosis: Secondary | ICD-10-CM

## 2016-08-09 DIAGNOSIS — A0811 Acute gastroenteropathy due to Norwalk agent: Secondary | ICD-10-CM

## 2016-08-09 LAB — PREPARE FRESH FROZEN PLASMA
Unit division: 0
Unit division: 0

## 2016-08-09 LAB — CBC WITH DIFFERENTIAL/PLATELET
BASOS ABS: 0 10*3/uL (ref 0.0–0.1)
Basophils Relative: 0 %
Eosinophils Absolute: 0 10*3/uL (ref 0.0–0.7)
Eosinophils Relative: 1 %
HEMATOCRIT: 32 % — AB (ref 39.0–52.0)
Hemoglobin: 11.2 g/dL — ABNORMAL LOW (ref 13.0–17.0)
Lymphocytes Relative: 18 %
Lymphs Abs: 0.6 10*3/uL — ABNORMAL LOW (ref 0.7–4.0)
MCH: 26 pg (ref 26.0–34.0)
MCHC: 35 g/dL (ref 30.0–36.0)
MCV: 74.4 fL — AB (ref 78.0–100.0)
MONO ABS: 0.6 10*3/uL (ref 0.1–1.0)
MONOS PCT: 19 %
NEUTROS PCT: 62 %
Neutro Abs: 1.9 10*3/uL (ref 1.7–7.7)
PLATELETS: 128 10*3/uL — AB (ref 150–400)
RBC: 4.3 MIL/uL (ref 4.22–5.81)
RDW: 21.2 % — AB (ref 11.5–15.5)
WBC: 3.1 10*3/uL — AB (ref 4.0–10.5)

## 2016-08-09 LAB — BASIC METABOLIC PANEL
Anion gap: 7 (ref 5–15)
BUN: 24 mg/dL — AB (ref 6–20)
CO2: 16 mmol/L — AB (ref 22–32)
CREATININE: 0.99 mg/dL (ref 0.61–1.24)
Calcium: 7.9 mg/dL — ABNORMAL LOW (ref 8.9–10.3)
Chloride: 118 mmol/L — ABNORMAL HIGH (ref 101–111)
GFR calc Af Amer: >60 mL/min (ref >60)
GFR calc non Af Amer: >60 mL/min (ref >60)
Glucose, Bld: 89 mg/dL (ref 65–99)
POTASSIUM: 2.5 mmol/L — AB (ref 3.5–5.1)
Sodium: 141 mmol/L (ref 135–145)

## 2016-08-09 LAB — MAGNESIUM: Magnesium: 2.3 mg/dL (ref 1.7–2.4)

## 2016-08-09 LAB — PROTIME-INR
INR: 2.19
Prothrombin Time: 24.8 seconds — ABNORMAL HIGH (ref 11.4–15.2)

## 2016-08-09 LAB — PHOSPHORUS: Phosphorus: 1.4 mg/dL — ABNORMAL LOW (ref 2.5–4.6)

## 2016-08-09 MED ORDER — POTASSIUM CHLORIDE CRYS ER 20 MEQ PO TBCR
30.0000 meq | EXTENDED_RELEASE_TABLET | ORAL | Status: DC
  Filled 2016-08-09: qty 1

## 2016-08-09 MED ORDER — ENSURE ENLIVE PO LIQD
237.0000 mL | Freq: Two times a day (BID) | ORAL | Status: DC
  Administered 2016-08-09 – 2016-08-11 (×4): 237 mL via ORAL

## 2016-08-09 MED ORDER — SODIUM CHLORIDE 0.9 % IV SOLN
30.0000 meq | Freq: Once | INTRAVENOUS | Status: AC
  Administered 2016-08-09: 30 meq via INTRAVENOUS
  Filled 2016-08-09: qty 15

## 2016-08-09 MED ORDER — POTASSIUM CHLORIDE 20 MEQ/15ML (10%) PO SOLN
40.0000 meq | Freq: Two times a day (BID) | ORAL | Status: AC
  Administered 2016-08-09 (×2): 40 meq via ORAL
  Filled 2016-08-09 (×2): qty 30

## 2016-08-09 MED ORDER — WARFARIN SODIUM 4 MG PO TABS
4.0000 mg | ORAL_TABLET | Freq: Once | ORAL | Status: AC
  Administered 2016-08-09: 4 mg via ORAL
  Filled 2016-08-09: qty 1

## 2016-08-09 NOTE — Progress Notes (Signed)
PCCM History and Physical Note  Admission date: 08/06/2016 Referring provider: Dr. 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  He had infusion catheter removed on 07/30/16.    Events: 2/03 Admit  2/4 amio gtt for RVR   Subjective / Interval Events:  Hr better, off amio gtt No diarrhea   Vital signs: BP 104/75   Pulse 88   Temp 98.9 F (37.2 C) (Oral)   Resp (!) 22   Ht 5\' 10"  (1.778 m)   Wt 248 lb 0.3 oz (112.5 kg)   SpO2 98%   BMI 35.59 kg/m   Intake/output: I/O last 3 completed shifts: In: 3581.5 [P.O.:400; I.V.:423.2; Other:195; IV Piggyback:2563.3] Out: 2550 [Urine:2550]  General: obese ill appearing man, NAD Neuro: awake, interacts  non-focal Eyes: OP dry, no lesions Cardiac: irreg irreg,  no edema Chest: clear bilaterally Abd: obese,no tenderness Ext: no deformities Skin: no rash   Recent Labs Lab 08/06/16 1400  08/07/16 0101 08/07/16 1824 08/07/16 1948 08/08/16 0500 08/09/16 0408  NA 134*  < > 135 137 131* 136 141  K 2.6*  < > 2.5* 2.9* 2.6* 3.0* 2.5*  CL 105  < > 111 113* 111 115* 118*  CO2 14*  --  16* 16* 14* 15* 16*  GLUCOSE 113*  < > 144* 117* 108* 93 89  BUN 74*  < > 71* 55* 53* 44* 24*  CREATININE 4.32*  < > 3.37* 2.06* 1.83* 1.45* 0.99  CALCIUM 7.9*  --  7.0* 7.6* 7.0* 7.6* 7.9*  MG 1.8  --  1.5*  --  2.3 2.3 2.3  PHOS  --   --  3.4  --   --   --  1.4*  < > = values in this interval not displayed.  Recent Labs Lab 08/07/16 0101 08/08/16 0500 08/09/16 0408  HGB 12.2* 10.8* 11.2*  HCT 34.1* 31.1* 32.0*  WBC 0.8* 1.8* 3.1*  PLT 117* 131* 128*    Recent Labs Lab 08/06/16 1413 08/06/16 1430 08/06/16 1932 08/07/16 0315  PHART  --   --  7.275* 7.352  PCO2ART   --   --  22.9* 22.2*  PO2ART  --   --  101 133*  HCO3  --  11.8* 10.3* 12.0*  TCO2 14  --   --   --   O2SAT  --  52.8 96.2 98.3    CBG (last 3)   Recent Labs  08/07/16 1948  GLUCAP 100*     Imaging: No results found.   Studies: PSG 04/18/15 >> AHI 83.1, SpO2 low 85% Echo 07/09/15 >> EF 55 to 60%, mild MR, mod LA dilation, mod/severe RA dilation  Antibiotics: Cipro 2/03 >> Flagyl 2/03 >> Vancomycin 2/03 >> 2/5  Cultures: Urine 2/03 >>ng Blood 2/03 >>ng GI panel 2/03 >> noroviris  Lines/tubes: Port >    Summary: 66 yo male with N/V/D after recent chemotherapy for cecal cancer.  He presents with hypovolemic/septic shock, chemotherapy induced leukopenia, AKI, metabolic acidosis, and acute metabolic encephalopathy.  Assessment/plan:  Hypovolemic/septic shock 2nd to acute gastroenteritis -norovirus with hx of cecal cancer on chemotherapy. -resolved - prn zofran -dc ABx   Lactic acidosis, resolved AKI from hypovolemia and medications>> baseline creatinine 0.9  from 07/28/16. Improving.  - renal u/s >> BL non obstructive stones Hypokalemia. - replace K+ aggressively    Chemotherapy induced leukopenia - resolving - follow CBC - neutropenic precautions  Hx of HTN, PAH, A fib on coumadin, chronic diastolic CHF. - hold ASA, dilt, lasix, hydralazine, lisinopril,sildenafil  -Off amio -restarted lopressor 25 bid, resume cardizem  Coagulopathy on coumadin, w sepsis - coumadin restarted - follow coags  Hx of OSA intolerant of CPAP. - no CPAP, O2 for SpO2 > 92%  Acute metabolic encephalopathy,resolved -PT/OT  DVT prophylaxis - SCDs SUP - Protonix Nutrition - try clear diet Goals of care - full code   PCCM off , can transfer to tele  Kara Mead MD. FCCP. [place] [facility] Pager [phone] If no response call [phone]    08/09/2016, 10:36 AM

## 2016-08-09 NOTE — Evaluation (Signed)
Physical Therapy Evaluation Patient Details Name: Steven Barnett MRN: [id number] DOB: 16-Nov-1950 Today's Date: 08/09/2016   History of Present Illness  66 yo male admitted with shock, +norovirus. Hx of colon ca, s/p colectomy 2017, anemia, A fib, CHF, HTN.   Clinical Impression  On eval, pt required Min guard assist for mobility. He walked ~40 feet while holding on to his IV pole. Pt is generally weak. Will follow and progress activity as tolerated. Recommend daily ambulation with nursing supervision.     Follow Up Recommendations Supervision - Intermittent    Equipment Recommendations  None recommended by PT    Recommendations for Other Services       Precautions / Restrictions Precautions Precautions: Fall Restrictions Weight Bearing Restrictions: No      Mobility  Bed Mobility Overal bed mobility: Needs Assistance Bed Mobility: Supine to Sit;Sit to Supine     Supine to sit: Supervision Sit to supine: Supervision   General bed mobility comments: for safety  Transfers Overall transfer level: Needs assistance Equipment used: None Transfers: Sit to/from [facility] Sit to Stand: Min guard Stand pivot transfers: Min guard       General transfer comment: close guard for safety. Stand pivot, bed to bsc, with pt using armrests or foot rail to steady himself  Ambulation/Gait Ambulation/Gait assistance: Min guard Ambulation Distance (Feet): 40 Feet Assistive device:  (IV pole) Gait Pattern/deviations: Step-through pattern;Decreased stride length     General Gait Details: unsteady but no overt LOB with support of IV pole. Pt took laps around room.   Stairs            Wheelchair Mobility    Modified Rankin (Stroke Patients Only)       Balance                                             Pertinent Vitals/Pain Pain Assessment: No/denies pain    Home Living Family/patient expects to be discharged to:: Private  residence Living Arrangements: Spouse/significant other Available Help at Discharge: Family Type of Home: Apartment Home Access: Stairs to enter Entrance Stairs-Rails: Right Entrance Stairs-Number of Steps: 1 flight Home Layout: One level Home Equipment: None      Prior Function Level of Independence: Independent               Hand Dominance        Extremity/Trunk Assessment   Upper Extremity Assessment Upper Extremity Assessment: Generalized weakness    Lower Extremity Assessment Lower Extremity Assessment: Generalized weakness    Cervical / Trunk Assessment Cervical / Trunk Assessment: Normal  Communication   Communication: No difficulties  Cognition Arousal/Alertness: Awake/alert Behavior During Therapy: WFL for tasks assessed/performed Overall Cognitive Status: Within Functional Limits for tasks assessed                      General Comments      Exercises     Assessment/Plan    PT Assessment Patient needs continued PT services  PT Problem List Decreased strength;Decreased mobility;Decreased balance;Decreased activity tolerance          PT Treatment Interventions Gait training;Therapeutic activities;Therapeutic exercise;Patient/family education;Functional mobility training;Balance training    PT Goals (Current goals can be found in the Care Plan section)  Acute Rehab PT Goals Patient Stated Goal: to feel better PT Goal Formulation: With patient Time For Goal  Achievement: 08/23/16 Potential to Achieve Goals: Good    Frequency Min 3X/week   Barriers to discharge        Co-evaluation               End of Session   Activity Tolerance: Patient tolerated treatment well Patient left: in bed;with call bell/phone within reach;with bed alarm set           Time: 1030-1106 PT Time Calculation (min) (ACUTE ONLY): 36 min   Charges:   PT Evaluation $PT Eval Low Complexity: 1 Procedure PT Treatments $Gait Training: 8-22  mins   PT G Codes:        Weston Anna, MPT Pager: [phone]

## 2016-08-09 NOTE — Progress Notes (Signed)
CRITICAL VALUE ALERT  Critical value received:  K 2.5  Date of notification:  08/09/16  Time of notification:  0506  Critical value read back:Yes.    Nurse who received alert:  Kennis Carina, RN  MD notified (1st page):  E-Link MD  Time of first page:  0522  MD notified (2nd page):  Time of second page:  Responding MD:  E-Link MD  Time MD responded:  [phone]

## 2016-08-09 NOTE — Progress Notes (Signed)
Steven Barnett for Warfarin Indication: atrial fibrillation  Allergies  Allergen Reactions  . No Known Allergies     Patient Measurements: Height: 5\' 10"  (177.8 cm) Weight: 248 lb 0.3 oz (112.5 kg) IBW/kg (Calculated) : 73  Vital Signs: Temp: 98.1 F (36.7 C) (02/06 0400) Temp Source: Oral (02/06 0400) BP: 122/77 (02/06 0600) Pulse Rate: 79 (02/06 0700)  Labs:  Recent Labs  08/07/16 0101  08/07/16 1948 08/08/16 0133 08/08/16 0500 08/08/16 0817 08/09/16 0408  HGB 12.2*  --   --   --  10.8*  --  11.2*  HCT 34.1*  --   --   --  31.1*  --  32.0*  PLT 117*  --   --   --  131*  --  128*  APTT  --   --   --   --  48*  --   --   LABPROT 24.2*  --   --   --  22.8*  --  24.8*  INR 2.13  --   --   --  1.98  --  2.19  CREATININE 3.37*  < > 1.83*  --  1.45*  --  0.99  TROPONINI  --   --  <0.03 <0.03  --  <0.03  --   < > = values in this interval not displayed.  Estimated Creatinine Clearance: 94.7 mL/min (by C-G formula based on SCr of 0.99 mg/dL).   Medical History: Past Medical History:  Diagnosis Date  . Anemia   . Arthralgia of both knees   . Arthritis    "right knee" (10/01/2015)  . Atrial fibrillation ([place])   . Cancer (HCC)    STAGE 1 COLON CANCER  . Chronic anticoagulation 2010   Coumadin  . Chronic diastolic CHF (congestive heart failure), NYHA class 2 ([place])   . Hypertension   . OSA (obstructive sleep apnea) 2016   "couldn't take the mask during the testing" (10/01/2015)  . Permanent atrial fibrillation ([place]) 2010  . Pneumonia 3/29/2017and june 2017      Assessment: 66 y.o. male presented to the ED on on 08/06/16 with c/o n/v/d. Patient was found to be in be in hypovolemic/septic shock. On warfarin PTA for h/o atrial fibrillation. Held on admission due to INR > 10  and vitamin K 10 mg IV was given 2/3.  INR now back down, resuming warfarin.  Home warfarin dose reported as 8 mg Sun-Wed, 4 mg Thu-Sat.  Last dose  2/2.  08/09/2016: INR therapeutic H/H and pltc slightly low but stable following recent chemotherapy Drug-drug interactions:  Flagyl, Cipro Amiodarone drip was DCd 2/5 Diet: Heart Healthy No bleeding reported  Could require lower warfarin dosage than usual while on antibiotics.  Goal Range:  INR 2-3   Plan:  Warfarin 4 mg today. INR daily while inpatient.  Clayburn Pert, PharmD, BCPS Pager: [phone] 08/09/2016  8:27 AM

## 2016-08-09 NOTE — Progress Notes (Signed)
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  He had infusion catheter removed on 07/30/16. He presents with hypovolemic/septic shock, chemotherapy induced leukopenia, AKI, metabolic acidosis, and acute metabolic encephalopathy.Transfer to SDU & to [facility] 2/6  Assessment and plan Hypovolemic/septic shock 2nd to acute gastroenteritis with hx of cecal cancer on chemotherapy. -resolved. Off vasopressors. Required norepinephrine after 4 L of volume resuscitation. -- am cortisol -14 - prn zofran GI panel 2/03 >> noroviris. Patient presented with bilious vomiting and diarrhea prior to this presentation.  Neutropenia Received Granix 416mcg daily , resolved  Lactic acidosis, resolved  AKI from hypovolemia and medications>> baseline creatinine 0.9 from 07/28/16. Improving. Presented with a creatinine of 4.65 - renal u/s >> BL non obstructive stones Creatinine back to baseline  AcuteGastroenteritis secondary to Norovirus Resolved Could consider stopping empiric antibiotics   Chemotherapy induced leukopenia - resolving - follow CBC - neutropenic precautions  Hx of HTN, PAH, A fib on coumadin, chronic diastolic CHF. - hold ASA,   lasix, hydralazine, lisinopril,sildenafil  Received amio for RVR briefly in ICU -discontinued, currently on Cardizem and Lopressor Rate is controlled   Coagulopathy on coumadin, w sepsis - coumadin resumed - follow coags  Hx of OSA intolerant of CPAP. - no CPAP, O2 for SpO2 > 62%  Acute  metabolic encephalopathy,resolved -PT/OT   Severe hypokalemia-replete aggressively and recheck, magnesium okay  DVT prophylaxsis Coumadin  Code Status:  Full code    Code Status Orders        Start     Ordered     Family Communication: Discussed in detail with the patient, all imaging results, lab results explained to the patient   Disposition Plan: Transfer to telemetry and monitor, discharge in one to 2 days      Consultants:  Oncology  Critical care    Procedures:  None  Antibiotics: Anti-infectives    Start     Dose/Rate Route Frequency Ordered Stop   08/08/16 1000  ciprofloxacin (CIPRO) IVPB 400 mg     400 mg 200 mL/hr over 60 Minutes Intravenous Every 12 hours 08/08/16 0936     08/08/16 0800  vancomycin (VANCOCIN) 1,250 mg in sodium chloride 0.9 % 250 mL IVPB  Status:  Discontinued     1,250 mg 166.7 mL/hr over 90 Minutes Intravenous Every 24 hours 08/08/16 0742 08/08/16 1119   08/06/16 2130  ciprofloxacin (CIPRO) IVPB 400 mg  Status:  Discontinued     400 mg 200 mL/hr over 60 Minutes Intravenous Every 24 hours 08/06/16 2111 08/08/16 0936   08/06/16 2115  vancomycin (VANCOCIN) 2,000 mg in sodium chloride 0.9 % 500 mL IVPB     2,000 mg 250 mL/hr over 120 Minutes Intravenous  Once 08/06/16 2111 08/07/16 0100   08/06/16 2045  metroNIDAZOLE (FLAGYL) IVPB 500 mg     500 mg 100 mL/hr over 60 Minutes Intravenous Every 8 hours 08/06/16 2045     08/06/16 1530  piperacillin-tazobactam (ZOSYN) IVPB 3.375 g     3.375 g 12.5 mL/hr  over 240 Minutes Intravenous  Once 08/06/16 1529 08/06/16 1730         HPI/Subjective: Patient tolerating diet, cc 100% better, denies any nausea vomiting or abdominal pain on heart healthy diet. No diarrhea today  Objective: Vitals:   08/09/16 0400 08/09/16 0500 08/09/16 0600 08/09/16 0700  BP: 110/76  122/77   Pulse: 90 91 74 79  Resp: (!) 21 15 (!) 21 17  Temp: 98.1 F (36.7 C)     TempSrc: Oral     SpO2: 97% 96% 96%  96%  Weight:      Height:        Intake/Output Summary (Last 24 hours) at 08/09/16 0751 Last data filed at 08/09/16 0744  Gross per 24 hour  Intake           1893.5 ml  Output             1650 ml  Net            243.5 ml    Exam:  Examination:  General exam: Appears calm and comfortable  Respiratory system: Clear to auscultation. Respiratory effort normal. Cardiovascular system: S1 & S2 heard, RRR. No JVD, murmurs, rubs, gallops or clicks. No pedal edema. Gastrointestinal system: Abdomen is nondistended, soft and nontender. No organomegaly or masses felt. Normal bowel sounds heard. Central nervous system: Alert and oriented. No focal neurological deficits. Extremities: Symmetric 5 x 5 power. Skin: No rashes, lesions or ulcers Psychiatry: Judgement and insight appear normal. Mood & affect appropriate.     Data Reviewed: I have personally reviewed following labs and imaging studies  Micro Results Recent Results (from the past 240 hour(s))  Culture, blood (Routine x 2)     Status: None (Preliminary result)   Collection Time: 08/06/16  1:23 PM  Result Value Ref Range Status   Specimen Description BLOOD LEFT PORTA CATH  Final   Special Requests BOTTLES DRAWN AEROBIC AND ANAEROBIC 5 CC EACH  Final   Culture   Final    NO GROWTH 2 DAYS Performed at [facility], [address], [place], [place] [address]    Report Status PENDING  Incomplete  Culture, blood (Routine x 2)     Status: None (Preliminary result)   Collection Time: 08/06/16  1:44 PM  Result Value Ref Range Status   Specimen Description BLOOD RIGHT ANTECUBITAL  Final   Special Requests BOTTLES DRAWN AEROBIC AND ANAEROBIC 5 CC EACH  Final   Culture   Final    NO GROWTH 2 DAYS Performed at [facility], [place] [address] [place], [place] [address]    Report Status PENDING  Incomplete  MRSA PCR Screening     Status: None   Collection Time: 08/06/16  6:12 PM  Result Value Ref Range Status   MRSA by PCR  NEGATIVE NEGATIVE Final    Comment:        The GeneXpert MRSA Assay (FDA approved for NASAL specimens only), is one component of a comprehensive MRSA colonization surveillance program. It is not intended to diagnose MRSA infection nor to guide or monitor treatment for MRSA infections.   Urine culture     Status: None   Collection Time: 08/06/16  9:40 PM  Result Value Ref Range Status   Specimen Description URINE, CATHETERIZED  Final   Special Requests NONE  Final   Culture   Final    NO GROWTH Performed at [facility], [address] [place], [place]  [address]    Report Status 08/08/2016 FINAL  Final  Gastrointestinal Panel by PCR , Stool     Status: Abnormal   Collection Time: 08/07/16 10:42 AM  Result Value Ref Range Status   Campylobacter species NOT DETECTED NOT DETECTED Final   Plesimonas shigelloides NOT DETECTED NOT DETECTED Final   Salmonella species NOT DETECTED NOT DETECTED Final   Yersinia enterocolitica NOT DETECTED NOT DETECTED Final   Vibrio species NOT DETECTED NOT DETECTED Final   Vibrio cholerae NOT DETECTED NOT DETECTED Final   Enteroaggregative E coli (EAEC) NOT DETECTED NOT DETECTED Final   Enteropathogenic E coli (EPEC) NOT DETECTED NOT DETECTED Final   Enterotoxigenic E coli (ETEC) NOT DETECTED NOT DETECTED Final   Shiga like toxin producing E coli (STEC) NOT DETECTED NOT DETECTED Final   Shigella/Enteroinvasive E coli (EIEC) NOT DETECTED NOT DETECTED Final   Cryptosporidium NOT DETECTED NOT DETECTED Final   Cyclospora cayetanensis NOT DETECTED NOT DETECTED Final   Entamoeba histolytica NOT DETECTED NOT DETECTED Final   Giardia lamblia NOT DETECTED NOT DETECTED Final   Adenovirus F40/41 NOT DETECTED NOT DETECTED Final   Astrovirus NOT DETECTED NOT DETECTED Final   Norovirus GI/GII DETECTED (A) NOT DETECTED Final    Comment: RESULT CALLED TO, READ BACK BY AND VERIFIED WITH: [facility] GRAHAM AT 2348 08/07/16.PMH    Rotavirus A NOT DETECTED NOT  DETECTED Final   Sapovirus (I, II, IV, and V) NOT DETECTED NOT DETECTED Final    Radiology Reports US Renal  Result Date: 08/07/2016 CLINICAL DATA:  Acute renal failure. EXAM: RENAL / URINARY TRACT ULTRASOUND COMPLETE COMPARISON:  None. FINDINGS: Right Kidney: Length: 12.1 cm. 7 mm nonobstructive calculus is seen in upper pole. Echogenicity within normal limits. No mass or hydronephrosis visualized. Left Kidney: Length: 11.9 cm. 9 mm calculus is noted in midpole. Echogenicity within normal limits. No mass or hydronephrosis visualized. Bladder: Decompressed secondary to Foley catheter. IMPRESSION: Bilateral nonobstructive nephrolithiasis. No hydronephrosis is noted. Electronically Signed   By: Marijo Conception, M.D.   On: 08/07/2016 07:48   Dg Chest Port 1 View  Result Date: 08/06/2016 CLINICAL DATA:  Respiratory distress, hypoxia and hypotension. EXAM: PORTABLE CHEST 1 VIEW COMPARISON:  05/18/2016 FINDINGS: Stable appearance of left-sided Port-A-Cath with the catheter tip in the SVC. Lungs show stable chronic lung disease without edema, airspace consolidation, pleural fluid or pneumothorax. The heart size is at the upper limits of normal. IMPRESSION: Stable chronic lung disease.  No acute findings. Electronically Signed   By: Aletta Edouard M.D.   On: 08/06/2016 14:58     CBC  Recent Labs Lab 08/06/16 1323 08/06/16 1400 08/06/16 1413 08/07/16 0101 08/08/16 0500 08/09/16 0408  WBC 2.2* 1.1*  --  0.8* 1.8* 3.1*  HGB 15.4 14.5 17.3* 12.2* 10.8* 11.2*  HCT 43.9 41.2 51.0 34.1* 31.1* 32.0*  PLT 182 160  --  117* 131* 128*  MCV 74.8* 75.2*  --  74.5* 74.0* 74.4*  MCH 26.2 26.5  --  26.6 25.7* 26.0  MCHC 35.1 35.2  --  35.8 34.7 35.0  RDW 20.7* 20.8*  --  20.5* 21.1* 21.2*  LYMPHSABS 0.7  --   --  0.2*  --  0.6*  MONOABS 0.6  --   --  0.2  --  0.6  EOSABS 0.0  --   --  0.0  --  0.0  BASOSABS 0.0  --   --  0.0  --  0.0    Chemistries   Recent Labs  Lab 08/06/16 1323 08/06/16 1400   08/07/16 0101 08/07/16 1824 08/07/16 1948 08/08/16 0500 08/09/16 0408  NA 132* 134*  < > 135 137 131* 136 141  K 2.9* 2.6*  < > 2.5* 2.9* 2.6* 3.0* 2.5*  CL 102 105  < > 111 113* 111 115* 118*  CO2 10* 14*  --  16* 16* 14* 15* 16*  GLUCOSE 122* 113*  < > 144* 117* 108* 93 89  BUN 75* 74*  < > 71* 55* 53* 44* 24*  CREATININE 4.65* 4.32*  < > 3.37* 2.06* 1.83* 1.45* 0.99  CALCIUM 8.5* 7.9*  --  7.0* 7.6* 7.0* 7.6* 7.9*  MG  --  1.8  --  1.5*  --  2.3 2.3 2.3  AST 20  --   --  15  --   --   --   --   ALT 20  --   --  14*  --   --   --   --   ALKPHOS 72  --   --  53  --   --   --   --   BILITOT 1.4*  --   --  1.8*  --   --   --   --   < > = values in this interval not displayed. ------------------------------------------------------------------------------------------------------------------ estimated creatinine clearance is 94.7 mL/min (by C-G formula based on SCr of 0.99 mg/dL). ------------------------------------------------------------------------------------------------------------------ No results for input(s): HGBA1C in the last 72 hours. ------------------------------------------------------------------------------------------------------------------ No results for input(s): CHOL, HDL, LDLCALC, TRIG, CHOLHDL, LDLDIRECT in the last 72 hours. ------------------------------------------------------------------------------------------------------------------ No results for input(s): TSH, T4TOTAL, T3FREE, THYROIDAB in the last 72 hours.  Invalid input(s): FREET3 ------------------------------------------------------------------------------------------------------------------ No results for input(s): VITAMINB12, FOLATE, FERRITIN, TIBC, IRON, RETICCTPCT in the last 72 hours.  Coagulation profile  Recent Labs Lab 08/06/16 1323 08/06/16 1400 08/07/16 0101 08/08/16 0500 08/09/16 0408  INR >10.00* >10.00* 2.13 1.98 2.19    No results for input(s): DDIMER in the last 72  hours.  Cardiac Enzymes  Recent Labs Lab 08/07/16 1948 08/08/16 0133 08/08/16 0817  TROPONINI <0.03 <0.03 <0.03   ------------------------------------------------------------------------------------------------------------------ Invalid input(s): POCBNP   CBG:  Recent Labs Lab 08/07/16 1948  GLUCAP 100*       Studies: No results found.    Lab Results  Component Value Date   HGBA1C 4.8 04/07/2016   Lab Results  Component Value Date   [facility]  11/20/2008    45        Total Cholesterol/HDL:CHD Risk Coronary Heart Disease Risk Table                     Men   Women  1/2 Average Risk   3.4   3.3  Average Risk       5.0   4.4  2 X Average Risk   9.6   7.1  3 X Average Risk  23.4   11.0        Use the calculated Patient Ratio above and the CHD Risk Table to determine the patient's CHD Risk.        ATP III CLASSIFICATION (LDL):  <100     mg/dL   Optimal  100-129  mg/dL   Near or Above                    Optimal  130-159  mg/dL   Borderline  160-189  mg/dL   High  >190     mg/dL   Very  High   CREATININE 0.99 08/09/2016       Scheduled Meds: . ciprofloxacin  400 mg Intravenous Q12H  . diltiazem  30 mg Oral TID  . lactose free nutrition  237 mL Oral BID BM  . metoprolol tartrate  25 mg Oral BID  . metronidazole  500 mg Intravenous Q8H  . pantoprazole (PROTONIX) IV  40 mg Intravenous Q24H  . potassium chloride  30 mEq Oral Q4H  . potassium chloride  40 mEq Oral BID  . potassium chloride (KCL MULTIRUN) 30 mEq in 265 mL IVPB  30 mEq Intravenous Once  . Warfarin - Pharmacist Dosing Inpatient   Does not apply q1800   Continuous Infusions:   LOS: 3 days    Time spent: >30 MINS    [facility]  [facility] Pager [phone]. If 7PM-7AM, please contact night-coverage at [email or web address], password [facility] 08/09/2016, 7:51 AM  LOS: 3 days

## 2016-08-09 NOTE — Progress Notes (Signed)
eLink Physician-Brief Progress Note Patient Name: Steven Barnett DOB: 09/12/1950 MRN: [id number]   Date of Service  08/09/2016  HPI/Events of Note  K 2.5  eICU Interventions  PO and IV repletion     Intervention Category Minor Interventions: Electrolytes abnormality - evaluation and management  [name] 08/09/2016, 5:23 AM

## 2016-08-10 DIAGNOSIS — A419 Sepsis, unspecified organism: Secondary | ICD-10-CM

## 2016-08-10 DIAGNOSIS — Z7901 Long term (current) use of anticoagulants: Secondary | ICD-10-CM

## 2016-08-10 DIAGNOSIS — I482 Chronic atrial fibrillation: Secondary | ICD-10-CM

## 2016-08-10 DIAGNOSIS — E876 Hypokalemia: Secondary | ICD-10-CM

## 2016-08-10 DIAGNOSIS — R630 Anorexia: Secondary | ICD-10-CM

## 2016-08-10 DIAGNOSIS — R6521 Severe sepsis with septic shock: Secondary | ICD-10-CM

## 2016-08-10 DIAGNOSIS — A0811 Acute gastroenteropathy due to Norwalk agent: Secondary | ICD-10-CM

## 2016-08-10 DIAGNOSIS — D6859 Other primary thrombophilia: Secondary | ICD-10-CM

## 2016-08-10 DIAGNOSIS — C182 Malignant neoplasm of ascending colon: Secondary | ICD-10-CM

## 2016-08-10 LAB — COMPREHENSIVE METABOLIC PANEL
ALK PHOS: 64 U/L (ref 38–126)
ALT: 14 U/L — ABNORMAL LOW (ref 17–63)
AST: 22 U/L (ref 15–41)
Albumin: 2.5 g/dL — ABNORMAL LOW (ref 3.5–5.0)
Anion gap: 7 (ref 5–15)
BILIRUBIN TOTAL: 1 mg/dL (ref 0.3–1.2)
BUN: 14 mg/dL (ref 6–20)
CALCIUM: 8.1 mg/dL — AB (ref 8.9–10.3)
CO2: 15 mmol/L — AB (ref 22–32)
CREATININE: 1.09 mg/dL (ref 0.61–1.24)
Chloride: 124 mmol/L — ABNORMAL HIGH (ref 101–111)
GFR calc Af Amer: >60 mL/min (ref >60)
GFR calc non Af Amer: >60 mL/min (ref >60)
GLUCOSE: 99 mg/dL (ref 65–99)
Potassium: 2.5 mmol/L — CL (ref 3.5–5.1)
SODIUM: 146 mmol/L — AB (ref 135–145)
Total Protein: 5.6 g/dL — ABNORMAL LOW (ref 6.5–8.1)

## 2016-08-10 LAB — CBC WITH DIFFERENTIAL/PLATELET
BASOS PCT: 1 %
Basophils Absolute: 0.1 10*3/uL (ref 0.0–0.1)
Eosinophils Absolute: 0.1 10*3/uL (ref 0.0–0.7)
Eosinophils Relative: 1 %
HEMATOCRIT: 33.1 % — AB (ref 39.0–52.0)
Hemoglobin: 11.4 g/dL — ABNORMAL LOW (ref 13.0–17.0)
LYMPHS ABS: 0.8 10*3/uL (ref 0.7–4.0)
Lymphocytes Relative: 8 %
MCH: 26 pg (ref 26.0–34.0)
MCHC: 34.4 g/dL (ref 30.0–36.0)
MCV: 75.6 fL — AB (ref 78.0–100.0)
MONO ABS: 1 10*3/uL (ref 0.1–1.0)
Monocytes Relative: 11 %
NEUTROS ABS: 7.5 10*3/uL (ref 1.7–7.7)
NRBC: 2 /100{WBCs} — AB
Neutrophils Relative %: 79 %
Platelets: 140 10*3/uL — ABNORMAL LOW (ref 150–400)
RBC: 4.38 MIL/uL (ref 4.22–5.81)
RDW: 21.9 % — AB (ref 11.5–15.5)
WBC: 9.5 10*3/uL (ref 4.0–10.5)

## 2016-08-10 LAB — PROTIME-INR
INR: 3.24
Prothrombin Time: 33.8 seconds — ABNORMAL HIGH (ref 11.4–15.2)

## 2016-08-10 LAB — PHOSPHORUS: PHOSPHORUS: 1.3 mg/dL — AB (ref 2.5–4.6)

## 2016-08-10 LAB — POTASSIUM: Potassium: 2.5 mmol/L — CL (ref 3.5–5.1)

## 2016-08-10 MED ORDER — SODIUM CHLORIDE 0.9% FLUSH
10.0000 mL | INTRAVENOUS | Status: DC | PRN
  Administered 2016-08-11 (×2): 10 mL
  Filled 2016-08-10 (×2): qty 40

## 2016-08-10 MED ORDER — WARFARIN SODIUM 4 MG PO TABS
4.0000 mg | ORAL_TABLET | Freq: Once | ORAL | Status: AC
  Administered 2016-08-10: 4 mg via ORAL
  Filled 2016-08-10: qty 1

## 2016-08-10 MED ORDER — POTASSIUM PHOSPHATE MONOBASIC 500 MG PO TABS
500.0000 mg | ORAL_TABLET | Freq: Three times a day (TID) | ORAL | Status: DC
  Administered 2016-08-10 – 2016-08-11 (×7): 500 mg via ORAL
  Filled 2016-08-10 (×8): qty 1

## 2016-08-10 MED ORDER — SODIUM BICARBONATE 650 MG PO TABS
650.0000 mg | ORAL_TABLET | Freq: Every day | ORAL | Status: DC
  Administered 2016-08-10 – 2016-08-11 (×2): 650 mg via ORAL
  Filled 2016-08-10 (×2): qty 1

## 2016-08-10 MED ORDER — DILTIAZEM HCL 60 MG PO TABS: 120.0000 mg | ORAL_TABLET | Freq: Every day | ORAL | Status: DC

## 2016-08-10 MED ORDER — POTASSIUM CHLORIDE CRYS ER 20 MEQ PO TBCR
40.0000 meq | EXTENDED_RELEASE_TABLET | ORAL | Status: AC
  Administered 2016-08-10 (×2): 40 meq via ORAL
  Filled 2016-08-10 (×2): qty 2

## 2016-08-10 MED ORDER — DEXTROSE 5 % IV SOLN
30.0000 mmol | Freq: Once | INTRAVENOUS | Status: AC
  Administered 2016-08-10: 30 mmol via INTRAVENOUS
  Filled 2016-08-10: qty 10

## 2016-08-10 MED ORDER — SODIUM CHLORIDE 0.9 % IV SOLN
30.0000 meq | Freq: Once | INTRAVENOUS | Status: AC
  Administered 2016-08-10: 30 meq via INTRAVENOUS
  Filled 2016-08-10: qty 15

## 2016-08-10 MED ORDER — METOPROLOL TARTRATE 25 MG PO TABS
12.5000 mg | ORAL_TABLET | Freq: Two times a day (BID) | ORAL | Status: DC
  Administered 2016-08-10 – 2016-08-11 (×2): 12.5 mg via ORAL
  Filled 2016-08-10 (×2): qty 1

## 2016-08-10 MED ORDER — NAPHAZOLINE-GLYCERIN 0.012-0.2 % OP SOLN
1.0000 [drp] | Freq: Four times a day (QID) | OPHTHALMIC | Status: DC | PRN
  Administered 2016-08-10: 1 [drp] via OPHTHALMIC
  Administered 2016-08-11: 2 [drp] via OPHTHALMIC
  Filled 2016-08-10: qty 15

## 2016-08-10 MED ORDER — POTASSIUM CHLORIDE CRYS ER 20 MEQ PO TBCR
40.0000 meq | EXTENDED_RELEASE_TABLET | Freq: Once | ORAL | Status: AC
  Administered 2016-08-10: 40 meq via ORAL
  Filled 2016-08-10: qty 2

## 2016-08-10 NOTE — Progress Notes (Signed)
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 He was started on pressors for septic shock and later found to have Norovirus.   Subjective: No appetite. No longer vomiting. No diarrhea or abdominal pain.   Assessment & Plan:   Principal Problem:   Gastroenteritis and septic shock due to Norovirus in setting of neutropenia - has resolved-   Active Problems: Metabolic acidosis  - CO2 of 10 on admission has improved to 15 - Cl is 124 which may partly be the cause -  likely related to extensive diarrhea on admission with loss of Bicarb - will give a low dose of Bicarb today  Hypokalemia/ hypophosphatemia - replacing aggressivly-  home when K . 3.5    Neutropenia, drug-induced (chemo related) - WBC 0.8 at one point  - has resolved- WBC count has risen to 9.5    Permanent atrial fibrillation    CHADS2VASC=2 Chronic anticoagulation - Coumadin - rate controlled- has been having pauses this AM of ~ 2 sec- hold Cardizem today and cut back on Metoprolol from 25 to 12.5 today    Cancer of right colon - s/p right hemicolectomy on chemo at this time    AKI (acute kidney injury)  - noted on admission and has resolved     DVT prophylaxis: coumadin Code Status: full code Family Communication:  Disposition Plan: home tomorrow if K has improved Consultants:   PCCM Procedures:     Antimicrobials:  Anti-infectives    Start     Dose/Rate Route Frequency Ordered Stop   08/08/16 1000  ciprofloxacin (CIPRO) IVPB 400 mg  Status:  Discontinued     400 mg 200 mL/hr over 60 Minutes Intravenous Every 12 hours 08/08/16 0936 08/09/16 1040   08/08/16 0800  vancomycin (VANCOCIN) 1,250 mg  in sodium chloride 0.9 % 250 mL IVPB  Status:  Discontinued     1,250 mg 166.7 mL/hr over 90 Minutes Intravenous Every 24 hours 08/08/16 0742 08/08/16 1119   08/06/16 2130  ciprofloxacin (CIPRO) IVPB 400 mg  Status:  Discontinued     400 mg 200 mL/hr over 60 Minutes Intravenous Every 24 hours 08/06/16 2111 08/08/16 0936   08/06/16 2115  vancomycin (VANCOCIN) 2,000 mg in sodium chloride 0.9 % 500 mL IVPB     2,000 mg 250 mL/hr over 120 Minutes Intravenous  Once 08/06/16 2111 08/07/16 0100   08/06/16 2045  metroNIDAZOLE (FLAGYL) IVPB 500 mg  Status:  Discontinued     500 mg 100 mL/hr over 60 Minutes Intravenous Every 8 hours 08/06/16 2045 08/09/16 1040   08/06/16 1530  piperacillin-tazobactam (ZOSYN) IVPB 3.375 g     3.375 g 12.5 mL/hr over 240 Minutes Intravenous  Once 08/06/16 1529 08/06/16 1730       Objective: Vitals:   08/09/16 1621 08/09/16 2241 08/10/16 0410 08/10/16 0430  BP: 112/89 129/72 95/78 97/74   Pulse: 77 83 84   Resp: 18 18 18    Temp: 98 F (36.7 C) 99 F (37.2 C) 98 F (36.7 C)   TempSrc: Oral Oral    SpO2: 98% 99% 98%   Weight:   110 kg (242 lb 6.4 oz)   Height:  Intake/Output Summary (Last 24 hours) at 08/10/16 1239 Last data filed at 08/10/16 0800  Gross per 24 hour  Intake              480 ml  Output             1750 ml  Net            -1270 ml   Filed Weights   08/08/16 0520 08/09/16 0200 08/10/16 0410  Weight: 114.3 kg (251 lb 15.8 oz) 112.5 kg (248 lb 0.3 oz) 110 kg (242 lb 6.4 oz)    Examination: General exam: Appears comfortable  HEENT: PERRLA, oral mucosa moist, no sclera icterus or thrush Respiratory system: Clear to auscultation. Respiratory effort normal. Cardiovascular system: S1 & S2 heard, RRR.  No murmurs  Gastrointestinal system: Abdomen soft, non-tender, nondistended. Normal bowel sound. No organomegaly Central nervous system: Alert and oriented. No focal neurological deficits. Extremities: No cyanosis, clubbing or  edema Skin: No rashes or ulcers Psychiatry:  Mood & affect appropriate.     Data Reviewed: I have personally reviewed following labs and imaging studies  CBC:  Recent Labs Lab 08/06/16 1323 08/06/16 1400 08/06/16 1413 08/07/16 0101 08/08/16 0500 08/09/16 0408 08/10/16 1005  WBC 2.2* 1.1*  --  0.8* 1.8* 3.1* 9.5  NEUTROABS 0.9*  --   --  0.4*  --  1.9 7.5  HGB 15.4 14.5 17.3* 12.2* 10.8* 11.2* 11.4*  HCT 43.9 41.2 51.0 34.1* 31.1* 32.0* 33.1*  MCV 74.8* 75.2*  --  74.5* 74.0* 74.4* 75.6*  PLT 182 160  --  117* 131* 128* 916*   Basic Metabolic Panel:  Recent Labs Lab 08/06/16 1400  08/07/16 0101 08/07/16 1824 08/07/16 1948 08/08/16 0500 08/09/16 0408 08/10/16 1005  NA 134*  < > 135 137 131* 136 141 146*  K 2.6*  < > 2.5* 2.9* 2.6* 3.0* 2.5* 2.5*  CL 105  < > 111 113* 111 115* 118* 124*  CO2 14*  --  16* 16* 14* 15* 16* 15*  GLUCOSE 113*  < > 144* 117* 108* 93 89 99  BUN 74*  < > 71* 55* 53* 44* 24* 14  CREATININE 4.32*  < > 3.37* 2.06* 1.83* 1.45* 0.99 1.09  CALCIUM 7.9*  --  7.0* 7.6* 7.0* 7.6* 7.9* 8.1*  MG 1.8  --  1.5*  --  2.3 2.3 2.3  --   PHOS  --   --  3.4  --   --   --  1.4*  --   < > = values in this interval not displayed. GFR: Estimated Creatinine Clearance: 85 mL/min (by C-G formula based on SCr of 1.09 mg/dL). Liver Function Tests:  Recent Labs Lab 08/06/16 1323 08/07/16 0101 08/10/16 1005  AST 20 15 22   ALT 20 14* 14*  ALKPHOS 72 53 64  BILITOT 1.4* 1.8* 1.0  PROT 6.9 4.7* 5.6*  ALBUMIN 3.2* 2.3* 2.5*   No results for input(s): LIPASE, AMYLASE in the last 168 hours. No results for input(s): AMMONIA in the last 168 hours. Coagulation Profile:  Recent Labs Lab 08/06/16 1400 08/07/16 0101 08/08/16 0500 08/09/16 0408 08/10/16 1005  INR >10.00* 2.13 1.98 2.19 3.24   Cardiac Enzymes:  Recent Labs Lab 08/07/16 1948 08/08/16 0133 08/08/16 0817  TROPONINI <0.03 <0.03 <0.03   BNP (last 3 results) No results for input(s): PROBNP  in the last 8760 hours. HbA1C: No results for input(s): HGBA1C in the last 72 hours. CBG:  Recent Labs  Lab 08/07/16 1948  GLUCAP 100*   Lipid Profile: No results for input(s): CHOL, HDL, LDLCALC, TRIG, CHOLHDL, LDLDIRECT in the last 72 hours. Thyroid Function Tests: No results for input(s): TSH, T4TOTAL, FREET4, T3FREE, THYROIDAB in the last 72 hours. Anemia Panel: No results for input(s): VITAMINB12, FOLATE, FERRITIN, TIBC, IRON, RETICCTPCT in the last 72 hours. Urine analysis:    Component Value Date/Time   COLORURINE AMBER (A) 08/06/2016 2140   APPEARANCEUR CLOUDY (A) 08/06/2016 2140   LABSPEC 1.017 08/06/2016 2140   PHURINE 5.0 08/06/2016 2140   GLUCOSEU NEGATIVE 08/06/2016 2140   HGBUR SMALL (A) 08/06/2016 2140   BILIRUBINUR NEGATIVE 08/06/2016 2140   [place] NEGATIVE 08/06/2016 2140   PROTEINUR 30 (A) 08/06/2016 2140   NITRITE NEGATIVE 08/06/2016 2140   LEUKOCYTESUR NEGATIVE 08/06/2016 2140   Sepsis Labs: @LABRCNTIP (procalcitonin:4,lacticidven:4) ) Recent Results (from the past 240 hour(s))  Culture, blood (Routine x 2)     Status: None (Preliminary result)   Collection Time: 08/06/16  1:23 PM  Result Value Ref Range Status   Specimen Description BLOOD LEFT PORTA CATH  Final   Special Requests BOTTLES DRAWN AEROBIC AND ANAEROBIC 5 CC EACH  Final   Culture   Final    NO GROWTH 3 DAYS Performed at [facility], [place] [address] [place], [place] [address]    Report Status PENDING  Incomplete  Culture, blood (Routine x 2)     Status: None (Preliminary result)   Collection Time: 08/06/16  1:44 PM  Result Value Ref Range Status   Specimen Description BLOOD RIGHT ANTECUBITAL  Final   Special Requests BOTTLES DRAWN AEROBIC AND ANAEROBIC 5 CC EACH  Final   Culture   Final    NO GROWTH 3 DAYS Performed at [facility], [place] [address] [place], [place] [address]    Report Status PENDING  Incomplete  MRSA PCR Screening     Status: None   Collection Time:  08/06/16  6:12 PM  Result Value Ref Range Status   MRSA by PCR NEGATIVE NEGATIVE Final    Comment:        The GeneXpert MRSA Assay (FDA approved for NASAL specimens only), is one component of a comprehensive MRSA colonization surveillance program. It is not intended to diagnose MRSA infection nor to guide or monitor treatment for MRSA infections.   Urine culture     Status: None   Collection Time: 08/06/16  9:40 PM  Result Value Ref Range Status   Specimen Description URINE, CATHETERIZED  Final   Special Requests NONE  Final   Culture   Final    NO GROWTH Performed at [facility], [address], [place], [place] [address]    Report Status 08/08/2016 FINAL  Final  Gastrointestinal Panel by PCR , Stool     Status: Abnormal   Collection Time: 08/07/16 10:42 AM  Result Value Ref Range Status   Campylobacter species NOT DETECTED NOT DETECTED Final   Plesimonas shigelloides NOT DETECTED NOT DETECTED Final   Salmonella species NOT DETECTED NOT DETECTED Final   Yersinia enterocolitica NOT DETECTED NOT DETECTED Final   Vibrio species NOT DETECTED NOT DETECTED Final   Vibrio cholerae NOT DETECTED NOT DETECTED Final   Enteroaggregative E coli (EAEC) NOT DETECTED NOT DETECTED Final   Enteropathogenic E coli (EPEC) NOT DETECTED NOT DETECTED Final   Enterotoxigenic E coli (ETEC) NOT DETECTED NOT DETECTED Final   Shiga like toxin producing E coli (STEC) NOT DETECTED NOT DETECTED  Final   Shigella/Enteroinvasive E coli (EIEC) NOT DETECTED NOT DETECTED Final   Cryptosporidium NOT DETECTED NOT DETECTED Final   Cyclospora cayetanensis NOT DETECTED NOT DETECTED Final   Entamoeba histolytica NOT DETECTED NOT DETECTED Final   Giardia lamblia NOT DETECTED NOT DETECTED Final   Adenovirus F40/41 NOT DETECTED NOT DETECTED Final   Astrovirus NOT DETECTED NOT DETECTED Final   Norovirus GI/GII DETECTED (A) NOT DETECTED Final    Comment: RESULT CALLED TO, READ BACK BY AND VERIFIED WITH: [facility]  GRAHAM AT 2348 08/07/16.PMH    Rotavirus A NOT DETECTED NOT DETECTED Final   Sapovirus (I, II, IV, and V) NOT DETECTED NOT DETECTED Final         Radiology Studies: No results found.    Scheduled Meds: . feeding supplement (ENSURE ENLIVE)  237 mL Oral BID BM  . metoprolol tartrate  12.5 mg Oral BID  . potassium phosphate (monobasic)  500 mg Oral TID WC & HS  . warfarin  4 mg Oral ONCE-1800  . Warfarin - Pharmacist Dosing Inpatient   Does not apply q1800   Continuous Infusions:   LOS: 4 days    Time spent in minutes: 66    Louise, MD [facility] Pager: [email or web address] Password [facility] 08/10/2016, 12:39 PM

## 2016-08-10 NOTE — Progress Notes (Signed)
Steven Barnett   DOB:07-Sep-1950   PF#:[id number]   ZHG#:[id number]  Oncology follow-up note  Subjective: Patient was transferred out of ICU, feels better overall, still feels weak, low appetite. No pain or diarrhea.    Objective:  Vitals:   08/10/16 1245 08/10/16 2057  BP: 99/77 126/86  Pulse: 87 86  Resp: 18 18  Temp: 98.2 F (36.8 C) 98.8 F (37.1 C)    Body mass index is 34.78 kg/m.  Intake/Output Summary (Last 24 hours) at 08/10/16 2108 Last data filed at 08/10/16 2058  Gross per 24 hour  Intake              960 ml  Output             2000 ml  Net            -1040 ml     Sclerae unicteric  Oropharynx clear  No peripheral adenopathy  Lungs clear -- no rales or rhonchi  Heart regular rate and rhythm  Abdomen benign  MSK no focal spinal tenderness, no peripheral edema  Neuro nonfocal   CBG (last 3)  No results for input(s): GLUCAP in the last 72 hours.   Labs:  Lab Results  Component Value Date   WBC 9.5 08/10/2016   HGB 11.4 (L) 08/10/2016   HCT 33.1 (L) 08/10/2016   MCV 75.6 (L) 08/10/2016   PLT 140 (L) 08/10/2016   NEUTROABS 7.5 08/10/2016    Urine Studies No results for input(s): UHGB, CRYS in the last 72 hours.  Invalid input(s): UACOL, UAPR, USPG, UPH, UTP, UGL, UKET, UBIL, UNIT, UROB, ULEU, UEPI, UWBC, URBC, UBAC, [place], [place], [place]  Basic Metabolic Panel:  Recent Labs Lab 08/06/16 1400  08/07/16 0101 08/07/16 1824 08/07/16 1948 08/08/16 0500 08/09/16 0408 08/10/16 1005 08/10/16 1725 08/10/16 1925  NA 134*  < > 135 137 131* 136 141 146*  --   --   K 2.6*  < > 2.5* 2.9* 2.6* 3.0* 2.5* 2.5* 2.5*  --   CL 105  < > 111 113* 111 115* 118* 124*  --   --   CO2 14*  --  16* 16* 14* 15* 16* 15*  --   --   GLUCOSE 113*  < > 144* 117* 108* 93 89 99  --   --   BUN 74*  < > 71* 55* 53* 44* 24* 14  --   --   CREATININE 4.32*  < > 3.37* 2.06* 1.83* 1.45* 0.99 1.09  --   --   CALCIUM 7.9*  --  7.0* 7.6* 7.0* 7.6* 7.9* 8.1*  --   --   MG 1.8  --  1.5*   --  2.3 2.3 2.3  --   --   --   PHOS  --   --  3.4  --   --   --  1.4*  --   --  1.3*  < > = values in this interval not displayed. GFR Estimated Creatinine Clearance: 85 mL/min (by C-G formula based on SCr of 1.09 mg/dL). Liver Function Tests:  Recent Labs Lab 08/06/16 1323 08/07/16 0101 08/10/16 1005  AST 20 15 22   ALT 20 14* 14*  ALKPHOS 72 53 64  BILITOT 1.4* 1.8* 1.0  PROT 6.9 4.7* 5.6*  ALBUMIN 3.2* 2.3* 2.5*   No results for input(s): LIPASE, AMYLASE in the last 168 hours. No results for input(s): AMMONIA in the last 168 hours. Coagulation profile  Recent Labs  Lab 08/06/16 1400 08/07/16 0101 08/08/16 0500 08/09/16 0408 08/10/16 1005  INR >10.00* 2.13 1.98 2.19 3.24    CBC:  Recent Labs Lab 08/06/16 1323 08/06/16 1400 08/06/16 1413 08/07/16 0101 08/08/16 0500 08/09/16 0408 08/10/16 1005  WBC 2.2* 1.1*  --  0.8* 1.8* 3.1* 9.5  NEUTROABS 0.9*  --   --  0.4*  --  1.9 7.5  HGB 15.4 14.5 17.3* 12.2* 10.8* 11.2* 11.4*  HCT 43.9 41.2 51.0 34.1* 31.1* 32.0* 33.1*  MCV 74.8* 75.2*  --  74.5* 74.0* 74.4* 75.6*  PLT 182 160  --  117* 131* 128* 140*   Cardiac Enzymes:  Recent Labs Lab 08/07/16 1948 08/08/16 0133 08/08/16 0817  TROPONINI <0.03 <0.03 <0.03   BNP: Invalid input(s): POCBNP CBG:  Recent Labs Lab 08/07/16 1948  GLUCAP 100*   D-Dimer No results for input(s): DDIMER in the last 72 hours. Hgb A1c No results for input(s): HGBA1C in the last 72 hours. Lipid Profile No results for input(s): CHOL, HDL, LDLCALC, TRIG, CHOLHDL, LDLDIRECT in the last 72 hours. Thyroid function studies No results for input(s): TSH, T4TOTAL, T3FREE, THYROIDAB in the last 72 hours.  Invalid input(s): FREET3 Anemia work up No results for input(s): VITAMINB12, FOLATE, FERRITIN, TIBC, IRON, RETICCTPCT in the last 72 hours. Microbiology Recent Results (from the past 240 hour(s))  Culture, blood (Routine x 2)     Status: None (Preliminary result)   Collection  Time: 08/06/16  1:23 PM  Result Value Ref Range Status   Specimen Description BLOOD LEFT PORTA CATH  Final   Special Requests BOTTLES DRAWN AEROBIC AND ANAEROBIC 5 CC EACH  Final   Culture   Final    NO GROWTH 4 DAYS Performed at [facility], [address] [place], [place] [address]    Report Status PENDING  Incomplete  Culture, blood (Routine x 2)     Status: None (Preliminary result)   Collection Time: 08/06/16  1:44 PM  Result Value Ref Range Status   Specimen Description BLOOD RIGHT ANTECUBITAL  Final   Special Requests BOTTLES DRAWN AEROBIC AND ANAEROBIC 5 CC EACH  Final   Culture   Final    NO GROWTH 4 DAYS Performed at [facility], [place] [address] [place], [place] [address]    Report Status PENDING  Incomplete  MRSA PCR Screening     Status: None   Collection Time: 08/06/16  6:12 PM  Result Value Ref Range Status   MRSA by PCR NEGATIVE NEGATIVE Final    Comment:        The GeneXpert MRSA Assay (FDA approved for NASAL specimens only), is one component of a comprehensive MRSA colonization surveillance program. It is not intended to diagnose MRSA infection nor to guide or monitor treatment for MRSA infections.   Urine culture     Status: None   Collection Time: 08/06/16  9:40 PM  Result Value Ref Range Status   Specimen Description URINE, CATHETERIZED  Final   Special Requests NONE  Final   Culture   Final    NO GROWTH Performed at [facility], [address], [place], [place] [address]    Report Status 08/08/2016 FINAL  Final  Gastrointestinal Panel by PCR , Stool     Status: Abnormal   Collection Time: 08/07/16 10:42 AM  Result Value Ref Range Status   Campylobacter species NOT DETECTED NOT DETECTED Final   Plesimonas shigelloides NOT DETECTED NOT DETECTED Final   Salmonella  species NOT DETECTED NOT DETECTED Final   Yersinia enterocolitica NOT DETECTED NOT DETECTED Final   Vibrio species NOT DETECTED NOT DETECTED Final   Vibrio cholerae  NOT DETECTED NOT DETECTED Final   Enteroaggregative E coli (EAEC) NOT DETECTED NOT DETECTED Final   Enteropathogenic E coli (EPEC) NOT DETECTED NOT DETECTED Final   Enterotoxigenic E coli (ETEC) NOT DETECTED NOT DETECTED Final   Shiga like toxin producing E coli (STEC) NOT DETECTED NOT DETECTED Final   Shigella/Enteroinvasive E coli (EIEC) NOT DETECTED NOT DETECTED Final   Cryptosporidium NOT DETECTED NOT DETECTED Final   Cyclospora cayetanensis NOT DETECTED NOT DETECTED Final   Entamoeba histolytica NOT DETECTED NOT DETECTED Final   Giardia lamblia NOT DETECTED NOT DETECTED Final   Adenovirus F40/41 NOT DETECTED NOT DETECTED Final   Astrovirus NOT DETECTED NOT DETECTED Final   Norovirus GI/GII DETECTED (A) NOT DETECTED Final    Comment: RESULT CALLED TO, READ BACK BY AND VERIFIED WITH: [facility] GRAHAM AT 2348 08/07/16.PMH    Rotavirus A NOT DETECTED NOT DETECTED Final   Sapovirus (I, II, IV, and V) NOT DETECTED NOT DETECTED Final      Studies:  No results found.  Assessment: 66 y.o. with stage III colon cancer, on adjuvant chemo, admitted for severe diarrhea, dehydration, AKI    1. Septic shock due to acute gastroenteritis, resolved now  2. Severe diarrhea, (+) Norovirus, resolved  3. AKI secondary to diarrhea and dehydration, resolved  4. Stage III colon cancer, on adjuvant chemotherapy FOLFOX  5. Hypertension  6. A. fib on Coumadin  7. Chronic diastolic CHF  8. OSA, on nocturnal CPAP  9. Neutropenia secondary to chemotherapy, resolved 10. Persistent hypokalemia 11. Anorexia   Plan:  -Neutropenia resolved, he received 1 dose of Granix  -Continue supportive care, nutrition supplement -I encouraged him to ambulate -he is scheduled to see me in clinic on 2/22   [name], MD 08/10/2016  9:08 PM

## 2016-08-10 NOTE — Progress Notes (Signed)
Steven Barnett for Warfarin Indication: atrial fibrillation  Allergies  Allergen Reactions  . No Known Allergies     Patient Measurements: Height: 5\' 10"  (177.8 cm) Weight: 242 lb 6.4 oz (110 kg) IBW/kg (Calculated) : 73  Vital Signs: Temp: 98 F (36.7 C) (02/07 0410) Temp Source: Oral (02/06 2241) BP: 97/74 (02/07 0430) Pulse Rate: 84 (02/07 0410)  Labs:  Recent Labs  08/07/16 1948 08/08/16 0133 08/08/16 0500 08/08/16 0817 08/09/16 0408  HGB  --   --  10.8*  --  11.2*  HCT  --   --  31.1*  --  32.0*  PLT  --   --  131*  --  128*  APTT  --   --  48*  --   --   LABPROT  --   --  22.8*  --  24.8*  INR  --   --  1.98  --  2.19  CREATININE 1.83*  --  1.45*  --  0.99  TROPONINI <0.03 <0.03  --  <0.03  --     Estimated Creatinine Clearance: 93.6 mL/min (by C-G formula based on SCr of 0.99 mg/dL).   Medical History: Past Medical History:  Diagnosis Date  . Anemia   . Arthralgia of both knees   . Arthritis    "right knee" (10/01/2015)  . Atrial fibrillation ([place])   . Cancer (HCC)    STAGE 1 COLON CANCER  . Chronic anticoagulation 2010   Coumadin  . Chronic diastolic CHF (congestive heart failure), NYHA class 2 ([place])   . Hypertension   . OSA (obstructive sleep apnea) 2016   "couldn't take the mask during the testing" (10/01/2015)  . Permanent atrial fibrillation ([place]) 2010  . Pneumonia 3/29/2017and june 2017      Assessment: 66 y.o. male presented to the ED on on 08/06/16 with c/o n/v/d. Patient was found to be in be in hypovolemic/septic shock. On warfarin PTA for h/o atrial fibrillation. Held on admission due to INR > 10  and vitamin K 10 mg IV was given 2/3.  INR now back down, resuming warfarin on 2/5.  Home warfarin dose reported as 8 mg Sun-Wed, 4 mg Thu-Sat.  Last dose 2/2.  Today, 08/10/2016: INR 2.19, therapeutic CBC:  H/H and pltc slightly low but stable following recent chemotherapy Drug-drug interactions:  Flagyl/Cipro (2/3-2/6), Amiodarone drip d/c 2/5 Diet: Heart Healthy No bleeding reported  Goal Range:  INR 2-3   Plan:  Warfarin 4 mg today. INR daily and CBC q72h while inpatient.  Gretta Arab PharmD, BCPS Pager [phone] 08/10/2016 7:33 AM

## 2016-08-10 NOTE — Progress Notes (Signed)
MEDICATION RELATED CONSULT NOTE - INITIAL   Pharmacy Consult for IV KPhos Indication: electrolyte deficiency  Allergies  Allergen Reactions  . No Known Allergies     Patient Measurements: Height: 5\' 10"  (177.8 cm) Weight: 242 lb 6.4 oz (110 kg) IBW/kg (Calculated) : 73  Vital Signs: Temp: 98.8 F (37.1 C) (02/07 2057) Temp Source: Oral (02/07 2057) BP: 126/86 (02/07 2057) Pulse Rate: 86 (02/07 2057) Intake/Output from previous day: 02/06 0701 - 02/07 0700 In: 240 [P.O.:240] Out: 1750 [Urine:1750] Intake/Output from this shift: Total I/O In: 240 [P.O.:240] Out: 450 [Urine:450]  Labs:  Recent Labs  08/08/16 0500 08/09/16 0408 08/10/16 1005 08/10/16 1925  WBC 1.8* 3.1* 9.5  --   HGB 10.8* 11.2* 11.4*  --   HCT 31.1* 32.0* 33.1*  --   PLT 131* 128* 140*  --   APTT 48*  --   --   --   CREATININE 1.45* 0.99 1.09  --   MG 2.3 2.3  --   --   PHOS  --  1.4*  --  1.3*  ALBUMIN  --   --  2.5*  --   PROT  --   --  5.6*  --   AST  --   --  22  --   ALT  --   --  14*  --   ALKPHOS  --   --  64  --   BILITOT  --   --  1.0  --    Estimated Creatinine Clearance: 85 mL/min (by C-G formula based on SCr of 1.09 mg/dL).  Medical History: Past Medical History:  Diagnosis Date  . Anemia   . Arthralgia of both knees   . Arthritis    "right knee" (10/01/2015)  . Atrial fibrillation ([place])   . Cancer (HCC)    STAGE 1 COLON CANCER  . Chronic anticoagulation 2010   Coumadin  . Chronic diastolic CHF (congestive heart failure), NYHA class 2 ([place])   . Hypertension   . OSA (obstructive sleep apnea) 2016   "couldn't take the mask during the testing" (10/01/2015)  . Permanent atrial fibrillation ([place]) 2010  . Pneumonia 3/29/2017and june 2017    Medications:  Scheduled:  . feeding supplement (ENSURE ENLIVE)  237 mL Oral BID BM  . metoprolol tartrate  12.5 mg Oral BID  . potassium chloride (KCL MULTIRUN) 30 mEq in 265 mL IVPB  30 mEq Intravenous Once  . potassium phosphate  (monobasic)  500 mg Oral TID WC & HS  . potassium phosphate IVPB (mmol)  30 mmol Intravenous Once  . sodium bicarbonate  650 mg Oral Daily  . Warfarin - Pharmacist Dosing Inpatient   Does not apply q1800   Infusions:    Assessment: 44 yoM with norovirus with and resulting electrolyte abnormalities. PMH Afib on coumadin, colon cancer s/p chemo, and recent AKI, now resolved. Pharmacy consulted to replete phosphorus IV   Phos, K low  Mg previously wnl  Also receiving scheduled KPhos oral and KCl IV/PO  Goal of Therapy:  Phos wnl  Plan:   IV potassium phosphate 30 mmol x 1  Phos level ordered for tomorrow AM  Reuel Boom, PharmD, BCPS Pager: [phone] 08/10/2016, 9:47 PM

## 2016-08-11 ENCOUNTER — Ambulatory Visit: Payer: Medicaid Other | Admitting: Hematology

## 2016-08-11 ENCOUNTER — Other Ambulatory Visit: Payer: Medicaid Other

## 2016-08-11 ENCOUNTER — Ambulatory Visit: Payer: Medicaid Other

## 2016-08-11 ENCOUNTER — Telehealth: Payer: Self-pay | Admitting: Internal Medicine

## 2016-08-11 LAB — BASIC METABOLIC PANEL
Anion gap: 6 (ref 5–15)
BUN: 10 mg/dL (ref 6–20)
CHLORIDE: 123 mmol/L — AB (ref 101–111)
CO2: 17 mmol/L — AB (ref 22–32)
Calcium: 8.1 mg/dL — ABNORMAL LOW (ref 8.9–10.3)
Creatinine, Ser: 1.06 mg/dL (ref 0.61–1.24)
GFR calc Af Amer: >60 mL/min (ref >60)
GLUCOSE: 99 mg/dL (ref 65–99)
POTASSIUM: 3.2 mmol/L — AB (ref 3.5–5.1)
Sodium: 146 mmol/L — ABNORMAL HIGH (ref 135–145)

## 2016-08-11 LAB — CULTURE, BLOOD (ROUTINE X 2)
CULTURE: NO GROWTH
CULTURE: NO GROWTH

## 2016-08-11 LAB — PHOSPHORUS: PHOSPHORUS: 2.7 mg/dL (ref 2.5–4.6)

## 2016-08-11 LAB — MAGNESIUM: Magnesium: 2.1 mg/dL (ref 1.7–2.4)

## 2016-08-11 LAB — PROTIME-INR
INR: 4.75
Prothrombin Time: 45.9 seconds — ABNORMAL HIGH (ref 11.4–15.2)

## 2016-08-11 MED ORDER — HEPARIN SOD (PORK) LOCK FLUSH 100 UNIT/ML IV SOLN
500.0000 [IU] | INTRAVENOUS | Status: AC | PRN
  Administered 2016-08-11: 500 [IU]

## 2016-08-11 MED ORDER — FUROSEMIDE 80 MG PO TABS: 80.0000 mg | ORAL_TABLET | Freq: Every day | ORAL | Status: DC

## 2016-08-11 MED ORDER — DILTIAZEM HCL 120 MG PO TABS: 60.0000 mg | ORAL_TABLET | Freq: Every day | ORAL | 0 refills | Status: DC

## 2016-08-11 MED ORDER — POTASSIUM CHLORIDE CRYS ER 20 MEQ PO TBCR
40.0000 meq | EXTENDED_RELEASE_TABLET | ORAL | Status: AC
  Administered 2016-08-11 (×2): 40 meq via ORAL
  Filled 2016-08-11 (×2): qty 2

## 2016-08-11 NOTE — Discharge Summary (Signed)
Physician Discharge Summary  Steven Barnett MHD:[id number] DOB: 11-26-1950 DOA: 08/06/2016  PCP: Elisabeth Cara, PA-C  Admit date: 08/06/2016 Discharge date: 08/11/2016  Admitted From: home Disposition:  home   Recommendations for Outpatient Follow-up:  1. Needs Bmet on Friday to f/u K and assessment of fluid status- decreased Lasix from 80 BID to 80 mg daily   2. INR elevated- Hold Coumadin and check INR on Friday  Home Health:  none Equipment/Devices:  none    Discharge Condition:  stable  CODE STATUS:  Full code   Diet recommendation:  Low sodium, heart healthy Consultations:  Oncology    Discharge Diagnoses:  Principal Problem:   Gastroenteritis due to norovirus Active Problems:   Septic shock (HCC)   AKI (acute kidney injury) ([place])   Neutropenia, drug-induced ([place])   Obstructive sleep apnea   Permanent atrial fibrillation (HCC)   Chronic anticoagulation - Coumadin, CHADS2VASC=2   Cancer of right colon (HCC)    Subjective: No complaints. Wants to go home. No nausea, vomiting diarrhea.   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 He was started on pressors for septic shock and later found to have Norovirus.    Hospital Course:  Principal Problem:   Gastroenteritis and septic shock due to Norovirus in setting of neutropenia - vomiting and diarrhea has resolved- tolerating a regular diet  Active Problems: Metabolic acidosis -  likely related to extensive diarrhea on admission with loss of Bicarb  - CO2 of 10 on admission has improved to 16 - Cl is 124 which may partly be the cause    AKI (acute kidney injury)  - noted on admission and has resolved with IV fluids - would recommend cutting back on dose of Lasix from 80 BID to 80 mg daily until Monday  Hypokalemia/  hypophosphatemia - replaced aggressivly-      Neutropenia, drug-induced (chemo related) - s/p one dose of Granix - WBC 0.8 at one point  - has resolved- WBC count has risen to 9.5 - Dr Burr Medico following    Permanent atrial fibrillation    CHADS2VASC=2 Chronic anticoagulation - Coumadin - rate controlled- 2/7 noted to have pauses  of ~ 2 sec- held Cardizem yesterday and cut back on Metoprolol from 25 to 12.5 - HR improved without pauses in past 24 hrs- will increase Metoprolol slightly to 25 mg daily to prevent RVR and cont to hold Cardizem for now    Cancer of right colon - s/p right hemicolectomy on chemo at this time - Dr Burr Medico following   Discharge Instructions  Discharge Instructions    Diet - low sodium heart healthy    Complete by:  As directed    Discharge instructions    Complete by:  As directed    INR/ Coumadin level and Bmet to be done at PCPs office this Friday.  Do not take Coumadin today or tomorrow- your doctor will tell you on Friday when to resume.   Increase activity slowly    Complete by:  As directed      Allergies as of 08/11/2016      Reactions   No Known Allergies       Medication List    STOP taking these medications   diltiazem 120 MG tablet Commonly known as:  CARDIZEM   warfarin 4  MG tablet Commonly known as:  COUMADIN     TAKE these medications   acetaminophen 500 MG tablet Commonly known as:  TYLENOL Take 1,000 mg by mouth 2 (two) times daily.   albuterol 108 (90 Base) MCG/ACT inhaler Commonly known as:  PROVENTIL HFA;VENTOLIN HFA Inhale 2 puffs into the lungs every 6 (six) hours as needed for wheezing or shortness of breath.   aspirin EC 81 MG tablet Take 81 mg by mouth daily.   docusate sodium 100 MG capsule Commonly known as:  COLACE Take 1 capsule (100 mg total) by mouth 2 (two) times daily. For constipation   ferrous sulfate 325 (65 FE) MG tablet Take 1 tablet (325 mg total) by mouth 2 (two) times daily with a meal.    furosemide 80 MG tablet Commonly known as:  LASIX Take 1 tablet (80 mg total) by mouth daily. What changed:  See the new instructions.   hydrALAZINE 50 MG tablet Commonly known as:  APRESOLINE TAKE ONE TABLET BY MOUTH TWICE DAILY   ipratropium 17 MCG/ACT inhaler Commonly known as:  ATROVENT HFA Inhale 2 puffs into the lungs every 6 (six) hours as needed for wheezing.   lidocaine-prilocaine cream Commonly known as:  EMLA Apply to affected area once What changed:  how much to take  how to take this  when to take this  additional instructions   lisinopril 20 MG tablet Commonly known as:  PRINIVIL,ZESTRIL TAKE ONE TABLET BY MOUTH ONCE DAILY   LORazepam 0.5 MG tablet Commonly known as:  ATIVAN Take 1 tablet (0.5 mg total) by mouth every 6 (six) hours as needed (Nausea or vomiting).   metoprolol tartrate 25 MG tablet Commonly known as:  LOPRESSOR TAKE ONE TABLET BY MOUTH TWICE DAILY   ondansetron 8 MG tablet Commonly known as:  ZOFRAN Take 1 tablet (8 mg total) by mouth 2 (two) times daily as needed for refractory nausea / vomiting. Start on day 3 after chemotherapy.   potassium chloride SA 20 MEQ tablet Commonly known as:  K-DUR,KLOR-CON Take 2 tablets (40 mEq total) by mouth daily.   prochlorperazine 10 MG tablet Commonly known as:  COMPAZINE Take 1 tablet (10 mg total) by mouth every 6 (six) hours as needed (Nausea or vomiting). What changed:  reasons to take this   sildenafil 20 MG tablet Commonly known as:  REVATIO TAKE TWO TO THREE TABLETS BY MOUTH ONCE DAILY AS NEEDED FOR SEXUAL ACTIVITY       Allergies  Allergen Reactions  . No Known Allergies      Procedures/Studies:    US Renal  Result Date: 08/07/2016 CLINICAL DATA:  Acute renal failure. EXAM: RENAL / URINARY TRACT ULTRASOUND COMPLETE COMPARISON:  None. FINDINGS: Right Kidney: Length: 12.1 cm. 7 mm nonobstructive calculus is seen in upper pole. Echogenicity within normal limits. No mass or  hydronephrosis visualized. Left Kidney: Length: 11.9 cm. 9 mm calculus is noted in midpole. Echogenicity within normal limits. No mass or hydronephrosis visualized. Bladder: Decompressed secondary to Foley catheter. IMPRESSION: Bilateral nonobstructive nephrolithiasis. No hydronephrosis is noted. Electronically Signed   By: Marijo Conception, M.D.   On: 08/07/2016 07:48   Dg Chest Port 1 View  Result Date: 08/06/2016 CLINICAL DATA:  Respiratory distress, hypoxia and hypotension. EXAM: PORTABLE CHEST 1 VIEW COMPARISON:  05/18/2016 FINDINGS: Stable appearance of left-sided Port-A-Cath with the catheter tip in the SVC. Lungs show stable chronic lung disease without edema, airspace consolidation, pleural fluid or pneumothorax. The heart size is at the  upper limits of normal. IMPRESSION: Stable chronic lung disease.  No acute findings. Electronically Signed   By: Aletta Edouard M.D.   On: 08/06/2016 14:58       Discharge Exam: Vitals:   08/10/16 2057 08/11/16 0404  BP: 126/86 (!) 126/96  Pulse: 86 99  Resp: 18 18  Temp: 98.8 F (37.1 C) 98 F (36.7 C)   Vitals:   08/10/16 0430 08/10/16 1245 08/10/16 2057 08/11/16 0404  BP: 97/74 99/77 126/86 (!) 126/96  Pulse:  87 86 99  Resp:  18 18 18   Temp:  98.2 F (36.8 C) 98.8 F (37.1 C) 98 F (36.7 C)  TempSrc:  Oral Oral Oral  SpO2:  99% 99% 100%  Weight:    111 kg (244 lb 12.8 oz)  Height:        General: Pt is alert, awake, not in acute distress Cardiovascular: RRR, S1/S2 +, no rubs, no gallops Respiratory: CTA bilaterally, no wheezing, no rhonchi Abdominal: Soft, NT, ND, bowel sounds + Extremities: no edema, no cyanosis    The results of significant diagnostics from this hospitalization (including imaging, microbiology, ancillary and laboratory) are listed below for reference.     Microbiology: Recent Results (from the past 240 hour(s))  Culture, blood (Routine x 2)     Status: None (Preliminary result)   Collection Time: 08/06/16   1:23 PM  Result Value Ref Range Status   Specimen Description BLOOD LEFT PORTA CATH  Final   Special Requests BOTTLES DRAWN AEROBIC AND ANAEROBIC 5 CC EACH  Final   Culture   Final    NO GROWTH 4 DAYS Performed at [facility], [address] [place], [place] [address]    Report Status PENDING  Incomplete  Culture, blood (Routine x 2)     Status: None (Preliminary result)   Collection Time: 08/06/16  1:44 PM  Result Value Ref Range Status   Specimen Description BLOOD RIGHT ANTECUBITAL  Final   Special Requests BOTTLES DRAWN AEROBIC AND ANAEROBIC 5 CC EACH  Final   Culture   Final    NO GROWTH 4 DAYS Performed at [facility], [place] [address] [place], [place] [address]    Report Status PENDING  Incomplete  MRSA PCR Screening     Status: None   Collection Time: 08/06/16  6:12 PM  Result Value Ref Range Status   MRSA by PCR NEGATIVE NEGATIVE Final    Comment:        The GeneXpert MRSA Assay (FDA approved for NASAL specimens only), is one component of a comprehensive MRSA colonization surveillance program. It is not intended to diagnose MRSA infection nor to guide or monitor treatment for MRSA infections.   Urine culture     Status: None   Collection Time: 08/06/16  9:40 PM  Result Value Ref Range Status   Specimen Description URINE, CATHETERIZED  Final   Special Requests NONE  Final   Culture   Final    NO GROWTH Performed at [facility], [address] [place], [place] [address]    Report Status 08/08/2016 FINAL  Final  Gastrointestinal Panel by PCR , Stool     Status: Abnormal   Collection Time: 08/07/16 10:42 AM  Result Value Ref Range Status   Campylobacter species NOT DETECTED NOT DETECTED Final   Plesimonas shigelloides NOT DETECTED NOT DETECTED Final   Salmonella species NOT DETECTED NOT DETECTED Final   Yersinia enterocolitica NOT DETECTED NOT DETECTED Final  Vibrio species NOT DETECTED NOT DETECTED Final   Vibrio cholerae NOT DETECTED  NOT DETECTED Final   Enteroaggregative E coli (EAEC) NOT DETECTED NOT DETECTED Final   Enteropathogenic E coli (EPEC) NOT DETECTED NOT DETECTED Final   Enterotoxigenic E coli (ETEC) NOT DETECTED NOT DETECTED Final   Shiga like toxin producing E coli (STEC) NOT DETECTED NOT DETECTED Final   Shigella/Enteroinvasive E coli (EIEC) NOT DETECTED NOT DETECTED Final   Cryptosporidium NOT DETECTED NOT DETECTED Final   Cyclospora cayetanensis NOT DETECTED NOT DETECTED Final   Entamoeba histolytica NOT DETECTED NOT DETECTED Final   Giardia lamblia NOT DETECTED NOT DETECTED Final   Adenovirus F40/41 NOT DETECTED NOT DETECTED Final   Astrovirus NOT DETECTED NOT DETECTED Final   Norovirus GI/GII DETECTED (A) NOT DETECTED Final    Comment: RESULT CALLED TO, READ BACK BY AND VERIFIED WITH: [facility] GRAHAM AT 2348 08/07/16.PMH    Rotavirus A NOT DETECTED NOT DETECTED Final   Sapovirus (I, II, IV, and V) NOT DETECTED NOT DETECTED Final     Labs: BNP (last 3 results)  Recent Labs  09/30/15 1443 02/02/16 1215 08/08/16 0500  BNP 599.0* 724.2* 93.7   Basic Metabolic Panel:  Recent Labs Lab 08/07/16 0101  08/07/16 1948 08/08/16 0500 08/09/16 0408 08/10/16 1005 08/10/16 1725 08/10/16 1925 08/11/16 0458  NA 135  < > 131* 136 141 146*  --   --  146*  K 2.5*  < > 2.6* 3.0* 2.5* 2.5* 2.5*  --  3.2*  CL 111  < > 111 115* 118* 124*  --   --  123*  CO2 16*  < > 14* 15* 16* 15*  --   --  17*  GLUCOSE 144*  < > 108* 93 89 99  --   --  99  BUN 71*  < > 53* 44* 24* 14  --   --  10  CREATININE 3.37*  < > 1.83* 1.45* 0.99 1.09  --   --  1.06  CALCIUM 7.0*  < > 7.0* 7.6* 7.9* 8.1*  --   --  8.1*  MG 1.5*  --  2.3 2.3 2.3  --   --   --  2.1  PHOS 3.4  --   --   --  1.4*  --   --  1.3* 2.7  < > = values in this interval not displayed. Liver Function Tests:  Recent Labs Lab 08/06/16 1323 08/07/16 0101 08/10/16 1005  AST 20 15 22   ALT 20 14* 14*  ALKPHOS 72 53 64  BILITOT 1.4* 1.8* 1.0  PROT 6.9  4.7* 5.6*  ALBUMIN 3.2* 2.3* 2.5*   No results for input(s): LIPASE, AMYLASE in the last 168 hours. No results for input(s): AMMONIA in the last 168 hours. CBC:  Recent Labs Lab 08/06/16 1323 08/06/16 1400 08/06/16 1413 08/07/16 0101 08/08/16 0500 08/09/16 0408 08/10/16 1005  WBC 2.2* 1.1*  --  0.8* 1.8* 3.1* 9.5  NEUTROABS 0.9*  --   --  0.4*  --  1.9 7.5  HGB 15.4 14.5 17.3* 12.2* 10.8* 11.2* 11.4*  HCT 43.9 41.2 51.0 34.1* 31.1* 32.0* 33.1*  MCV 74.8* 75.2*  --  74.5* 74.0* 74.4* 75.6*  PLT 182 160  --  117* 131* 128* 140*   Cardiac Enzymes:  Recent Labs Lab 08/07/16 1948 08/08/16 0133 08/08/16 0817  TROPONINI <0.03 <0.03 <0.03   BNP: Invalid input(s): POCBNP CBG:  Recent Labs Lab 08/07/16 1948  GLUCAP 100*  D-Dimer No results for input(s): DDIMER in the last 72 hours. Hgb A1c No results for input(s): HGBA1C in the last 72 hours. Lipid Profile No results for input(s): CHOL, HDL, LDLCALC, TRIG, CHOLHDL, LDLDIRECT in the last 72 hours. Thyroid function studies No results for input(s): TSH, T4TOTAL, T3FREE, THYROIDAB in the last 72 hours.  Invalid input(s): FREET3 Anemia work up No results for input(s): VITAMINB12, FOLATE, FERRITIN, TIBC, IRON, RETICCTPCT in the last 72 hours. Urinalysis    Component Value Date/Time   COLORURINE AMBER (A) 08/06/2016 2140   APPEARANCEUR CLOUDY (A) 08/06/2016 2140   LABSPEC 1.017 08/06/2016 2140   PHURINE 5.0 08/06/2016 2140   GLUCOSEU NEGATIVE 08/06/2016 2140   HGBUR SMALL (A) 08/06/2016 2140   BILIRUBINUR NEGATIVE 08/06/2016 2140   [place] NEGATIVE 08/06/2016 2140   PROTEINUR 30 (A) 08/06/2016 2140   NITRITE NEGATIVE 08/06/2016 2140   LEUKOCYTESUR NEGATIVE 08/06/2016 2140   Sepsis Labs Invalid input(s): PROCALCITONIN,  WBC,  LACTICIDVEN Microbiology Recent Results (from the past 240 hour(s))  Culture, blood (Routine x 2)     Status: None (Preliminary result)   Collection Time: 08/06/16  1:23 PM  Result  Value Ref Range Status   Specimen Description BLOOD LEFT PORTA CATH  Final   Special Requests BOTTLES DRAWN AEROBIC AND ANAEROBIC 5 CC EACH  Final   Culture   Final    NO GROWTH 4 DAYS Performed at [facility], [place] [address], [place], [place] [address]    Report Status PENDING  Incomplete  Culture, blood (Routine x 2)     Status: None (Preliminary result)   Collection Time: 08/06/16  1:44 PM  Result Value Ref Range Status   Specimen Description BLOOD RIGHT ANTECUBITAL  Final   Special Requests BOTTLES DRAWN AEROBIC AND ANAEROBIC 5 CC EACH  Final   Culture   Final    NO GROWTH 4 DAYS Performed at [facility], [place] [address], [place], [place] [address]    Report Status PENDING  Incomplete  MRSA PCR Screening     Status: None   Collection Time: 08/06/16  6:12 PM  Result Value Ref Range Status   MRSA by PCR NEGATIVE NEGATIVE Final    Comment:        The GeneXpert MRSA Assay (FDA approved for NASAL specimens only), is one component of a comprehensive MRSA colonization surveillance program. It is not intended to diagnose MRSA infection nor to guide or monitor treatment for MRSA infections.   Urine culture     Status: None   Collection Time: 08/06/16  9:40 PM  Result Value Ref Range Status   Specimen Description URINE, CATHETERIZED  Final   Special Requests NONE  Final   Culture   Final    NO GROWTH Performed at [facility], [address], [place], [place] [address]    Report Status 08/08/2016 FINAL  Final  Gastrointestinal Panel by PCR , Stool     Status: Abnormal   Collection Time: 08/07/16 10:42 AM  Result Value Ref Range Status   Campylobacter species NOT DETECTED NOT DETECTED Final   Plesimonas shigelloides NOT DETECTED NOT DETECTED Final   Salmonella species NOT DETECTED NOT DETECTED Final   Yersinia enterocolitica NOT DETECTED NOT DETECTED Final   Vibrio species NOT DETECTED NOT DETECTED Final   Vibrio cholerae NOT DETECTED NOT DETECTED Final    Enteroaggregative E coli (EAEC) NOT DETECTED NOT DETECTED Final   Enteropathogenic E coli (EPEC) NOT DETECTED NOT DETECTED  Final   Enterotoxigenic E coli (ETEC) NOT DETECTED NOT DETECTED Final   Shiga like toxin producing E coli (STEC) NOT DETECTED NOT DETECTED Final   Shigella/Enteroinvasive E coli (EIEC) NOT DETECTED NOT DETECTED Final   Cryptosporidium NOT DETECTED NOT DETECTED Final   Cyclospora cayetanensis NOT DETECTED NOT DETECTED Final   Entamoeba histolytica NOT DETECTED NOT DETECTED Final   Giardia lamblia NOT DETECTED NOT DETECTED Final   Adenovirus F40/41 NOT DETECTED NOT DETECTED Final   Astrovirus NOT DETECTED NOT DETECTED Final   Norovirus GI/GII DETECTED (A) NOT DETECTED Final    Comment: RESULT CALLED TO, READ BACK BY AND VERIFIED WITH: [facility] GRAHAM AT 2348 08/07/16.PMH    Rotavirus A NOT DETECTED NOT DETECTED Final   Sapovirus (I, II, IV, and V) NOT DETECTED NOT DETECTED Final     Time coordinating discharge: Over 30 minutes  SIGNED:   Debbe Odea, MD  [facility] 08/11/2016, 10:12 AM Pager   If 7PM-7AM, please contact night-coverage [email or web address] Password [email or web address]

## 2016-08-11 NOTE — Telephone Encounter (Signed)
[name] with [facility] is calling and would like to know if we could put in an order for patient to have BMET done tomorrow? Thanks.

## 2016-08-11 NOTE — Progress Notes (Signed)
Physical Therapy Treatment Patient Details Name: SELAH ZELMAN MRN: [id number] DOB: 1950/07/12 Today's Date: 08/11/2016    History of Present Illness 66 yo male admitted with shock, +norovirus. Hx of colon ca, s/p colectomy 2017, anemia, A fib, CHF, HTN.     PT Comments    Assisted pt OOB to amb and monitor RA which was 96%.  Follow Up Recommendations  Supervision - Intermittent     Equipment Recommendations  None recommended by PT    Recommendations for Other Services       Precautions / Restrictions Precautions Precautions: Fall Restrictions Weight Bearing Restrictions: No    Mobility  Bed Mobility Overal bed mobility: Needs Assistance Bed Mobility: Supine to Sit;Sit to Supine     Supine to sit: Supervision Sit to supine: Supervision   General bed mobility comments: max encouragement  Transfers Overall transfer level: Needs assistance Equipment used: None Transfers: Sit to/from Stand   Stand pivot transfers: Min guard       General transfer comment: close guard for safety and 25% VC's for hand placement with stand to sit  Ambulation/Gait Ambulation/Gait assistance: Min guard Ambulation Distance (Feet): 45 Feet Assistive device: None Gait Pattern/deviations: Step-through pattern;Decreased stride length Gait velocity: WFL   General Gait Details: unsteady but no overt LOB with support of IV pole.   avg RA 96%   Stairs            Wheelchair Mobility    Modified Rankin (Stroke Patients Only)       Balance                                    Cognition Arousal/Alertness: Awake/alert Behavior During Therapy: WFL for tasks assessed/performed Overall Cognitive Status: Within Functional Limits for tasks assessed                      Exercises      General Comments        Pertinent Vitals/Pain Pain Assessment: No/denies pain    Home Living                      Prior Function            PT Goals  (current goals can now be found in the care plan section) Progress towards PT goals: Progressing toward goals    Frequency    Min 3X/week      PT Plan Current plan remains appropriate    Co-evaluation             End of Session Equipment Utilized During Treatment: Gait belt Activity Tolerance: Patient tolerated treatment well Patient left: in bed;with call bell/phone within reach;with bed alarm set     Time: 1340-1355 PT Time Calculation (min) (ACUTE ONLY): 15 min  Charges:  $Gait Training: 8-22 mins                    G Codes:      Rica Koyanagi  PTA [facility]  Acute  Rehab Pager      [phone]

## 2016-08-11 NOTE — Telephone Encounter (Signed)
Returned call to patient - daughter Steven Barnett answered. Informed her that [name] called our office and I am unsure who he is - she does not who this is either. She states patient is still admitted at [facility]. Informed her that patient has a coumadin clinic appt tomorrow (appt made today @ 1130) - she was unaware of this. Advised that any labs, orders should be explained at discharge teaching. Apologized for inconvenience of contacting her instead of [name], but contact info provided was her info instead of the actual caller.

## 2016-08-11 NOTE — Progress Notes (Signed)
Pt states that he go to Dr. [name] for everything and he is home with family.

## 2016-08-11 NOTE — Progress Notes (Signed)
[place] for Warfarin Indication: atrial fibrillation  Allergies  Allergen Reactions  . No Known Allergies    Patient Measurements: Height: 5\' 10"  (177.8 cm) Weight: 244 lb 12.8 oz (111 kg) IBW/kg (Calculated) : 73  Vital Signs: Temp: 98 F (36.7 C) (02/08 0404) Temp Source: Oral (02/08 0404) BP: 126/96 (02/08 0404) Pulse Rate: 99 (02/08 0404)  Labs:  Recent Labs  08/08/16 0817 08/09/16 0408 08/10/16 1005 08/11/16 0458  HGB  --  11.2* 11.4*  --   HCT  --  32.0* 33.1*  --   PLT  --  128* 140*  --   LABPROT  --  24.8* 33.8* 45.9*  INR  --  2.19 3.24 PENDING  CREATININE  --  0.99 1.09 1.06  TROPONINI <0.03  --   --   --    Estimated Creatinine Clearance: 87.8 mL/min (by C-G formula based on SCr of 1.06 mg/dL).  Medical History: Past Medical History:  Diagnosis Date  . Anemia   . Arthralgia of both knees   . Arthritis    "right knee" (10/01/2015)  . Atrial fibrillation ([place])   . Cancer (HCC)    STAGE 1 COLON CANCER  . Chronic anticoagulation 2010   Coumadin  . Chronic diastolic CHF (congestive heart failure), NYHA class 2 ([place])   . Hypertension   . OSA (obstructive sleep apnea) 2016   "couldn't take the mask during the testing" (10/01/2015)  . Permanent atrial fibrillation ([place]) 2010  . Pneumonia 3/29/2017and june 2017   Assessment: 66 y.o. male presented to the ED on on 08/06/16 with c/o n/v/d. Patient was found to be in be in hypovolemic/septic shock. On warfarin PTA for h/o atrial fibrillation. Held on admission due to INR > 10  and vitamin K 10 mg IV was given 2/3.  INR now back down, resuming warfarin on 2/5.  Home warfarin dose reported as 8 mg Sun-Wed, 4 mg Thu-Sat.  Last dose 2/2.  Today, 08/11/2016: INR of 3.24 resulted 2/7 at 10:05 - Warfarin 4mg  ordered for 2/7 based on previous INR 2.19 on 2/6 INR today further increased at 4.75 likely due to Cipro/Flagyl interaction CBC:  H/H and pltc slightly low but stable  following recent chemotherapy Drug-drug interactions: Flagyl/Cipro (2/3-2/6), Amiodarone drip d/c 2/5 Diet: Heart Healthy No bleeding reported  Goal Range:  INR 2-3   Plan:  No Warfarin today INR daily and CBC q72h while inpatient.  Minda Ditto PharmD Pager [phone] 08/11/2016, 7:45 AM

## 2016-08-12 ENCOUNTER — Ambulatory Visit (INDEPENDENT_AMBULATORY_CARE_PROVIDER_SITE_OTHER): Payer: Medicaid Other | Admitting: Pharmacist

## 2016-08-12 ENCOUNTER — Telehealth: Payer: Self-pay | Admitting: *Deleted

## 2016-08-12 DIAGNOSIS — Z7901 Long term (current) use of anticoagulants: Secondary | ICD-10-CM

## 2016-08-12 DIAGNOSIS — E876 Hypokalemia: Secondary | ICD-10-CM

## 2016-08-12 DIAGNOSIS — I4821 Permanent atrial fibrillation: Secondary | ICD-10-CM

## 2016-08-12 DIAGNOSIS — I482 Chronic atrial fibrillation: Secondary | ICD-10-CM

## 2016-08-12 LAB — PROTIME-INR
INR: 6.1 — AB
Prothrombin Time: 60.3 s — ABNORMAL HIGH (ref 9.0–11.5)

## 2016-08-12 LAB — POCT INR: INR: 6.9

## 2016-08-12 LAB — BASIC METABOLIC PANEL
BUN: 13 mg/dL (ref 7–25)
CHLORIDE: 119 mmol/L — AB (ref 98–110)
CO2: 20 mmol/L (ref 20–31)
Calcium: 7.6 mg/dL — ABNORMAL LOW (ref 8.6–10.3)
Creat: 0.96 mg/dL (ref 0.70–1.25)
Glucose, Bld: 84 mg/dL (ref 65–99)
POTASSIUM: 4.2 mmol/L (ref 3.5–5.3)
SODIUM: 146 mmol/L (ref 135–146)

## 2016-08-12 NOTE — Addendum Note (Signed)
Addended by: Harrington Challenger on: 08/12/2016 02:52 PM   Modules accepted: Level of Service

## 2016-08-12 NOTE — Telephone Encounter (Signed)
Escorted patient to lab per pharmacist-patient reports d/c from hospital yesterday and thought they were suppose to see Dr. [name] today.  Unaware of this.  After chart review-unable to locate appt with Dr. [name] today but offerred appointment next week with APP.  Wife and patient agreed and verbalized understanding.  Appointment made for 2/14 with Almyra Deforest PA at 2pm at [facility] office.    Patient was due for f/u in January 2018.  Also received call from lab-patient does not have lab orders.  Per hospital d/c patient suppose to have BMET today to follow K-order placed.

## 2016-08-15 ENCOUNTER — Telehealth: Payer: Self-pay | Admitting: Pharmacist Clinician (PhC)/ Clinical Pharmacy Specialist

## 2016-08-15 NOTE — Telephone Encounter (Signed)
Patient due for INR today, was 6.9 on Friday.  Post hospital for norovirus.  Was advised to hold x 3 days and return today for repeat INR.  Wife states diarrhea no longer problem, but patient still feeling weak, need to reschedule to Friday.   Previous to hospital stay was on 8 mg daily.  Advised he take 4 mg Mon and Thurs, 8 mg Tues and Wed.  Be sure to keep appt for Friday.  Wife voiced understanding.

## 2016-08-17 ENCOUNTER — Ambulatory Visit (HOSPITAL_BASED_OUTPATIENT_CLINIC_OR_DEPARTMENT_OTHER): Payer: Medicaid Other

## 2016-08-17 ENCOUNTER — Other Ambulatory Visit: Payer: Self-pay | Admitting: *Deleted

## 2016-08-17 ENCOUNTER — Encounter (HOSPITAL_COMMUNITY): Payer: Self-pay

## 2016-08-17 ENCOUNTER — Encounter: Payer: Self-pay | Admitting: Physician Assistant

## 2016-08-17 ENCOUNTER — Telehealth: Payer: Self-pay | Admitting: *Deleted

## 2016-08-17 ENCOUNTER — Ambulatory Visit (INDEPENDENT_AMBULATORY_CARE_PROVIDER_SITE_OTHER): Payer: Medicaid Other | Admitting: Physician Assistant

## 2016-08-17 ENCOUNTER — Emergency Department (HOSPITAL_COMMUNITY): Payer: Medicaid Other

## 2016-08-17 ENCOUNTER — Inpatient Hospital Stay (HOSPITAL_COMMUNITY)
Admission: EM | Admit: 2016-08-17 | Discharge: 2016-08-20 | DRG: 193 | Disposition: A | Discharge status: Home Health | Payer: Medicaid Other | Attending: Family Medicine | Admitting: Family Medicine

## 2016-08-17 VITALS — BP 78/54 | HR 56 | Ht 70.0 in | Wt 237.8 lb

## 2016-08-17 DIAGNOSIS — I4892 Unspecified atrial flutter: Secondary | ICD-10-CM | POA: Diagnosis present

## 2016-08-17 DIAGNOSIS — M1711 Unilateral primary osteoarthritis, right knee: Secondary | ICD-10-CM | POA: Diagnosis present

## 2016-08-17 DIAGNOSIS — J9601 Acute respiratory failure with hypoxia: Secondary | ICD-10-CM | POA: Diagnosis present

## 2016-08-17 DIAGNOSIS — I482 Chronic atrial fibrillation: Secondary | ICD-10-CM | POA: Diagnosis present

## 2016-08-17 DIAGNOSIS — D509 Iron deficiency anemia, unspecified: Secondary | ICD-10-CM | POA: Diagnosis present

## 2016-08-17 DIAGNOSIS — Z9049 Acquired absence of other specified parts of digestive tract: Secondary | ICD-10-CM | POA: Diagnosis not present

## 2016-08-17 DIAGNOSIS — J189 Pneumonia, unspecified organism: Secondary | ICD-10-CM

## 2016-08-17 DIAGNOSIS — I5032 Chronic diastolic (congestive) heart failure: Secondary | ICD-10-CM | POA: Diagnosis present

## 2016-08-17 DIAGNOSIS — R509 Fever, unspecified: Secondary | ICD-10-CM | POA: Diagnosis not present

## 2016-08-17 DIAGNOSIS — I959 Hypotension, unspecified: Secondary | ICD-10-CM

## 2016-08-17 DIAGNOSIS — D696 Thrombocytopenia, unspecified: Secondary | ICD-10-CM | POA: Diagnosis present

## 2016-08-17 DIAGNOSIS — C182 Malignant neoplasm of ascending colon: Secondary | ICD-10-CM

## 2016-08-17 DIAGNOSIS — Z85048 Personal history of other malignant neoplasm of rectum, rectosigmoid junction, and anus: Secondary | ICD-10-CM

## 2016-08-17 DIAGNOSIS — Z87891 Personal history of nicotine dependence: Secondary | ICD-10-CM | POA: Diagnosis not present

## 2016-08-17 DIAGNOSIS — Z7901 Long term (current) use of anticoagulants: Secondary | ICD-10-CM

## 2016-08-17 DIAGNOSIS — C19 Malignant neoplasm of rectosigmoid junction: Secondary | ICD-10-CM

## 2016-08-17 DIAGNOSIS — R6510 Systemic inflammatory response syndrome (SIRS) of non-infectious origin without acute organ dysfunction: Secondary | ICD-10-CM

## 2016-08-17 DIAGNOSIS — I481 Persistent atrial fibrillation: Secondary | ICD-10-CM | POA: Diagnosis present

## 2016-08-17 DIAGNOSIS — I110 Hypertensive heart disease with heart failure: Secondary | ICD-10-CM | POA: Diagnosis present

## 2016-08-17 DIAGNOSIS — I272 Pulmonary hypertension, unspecified: Secondary | ICD-10-CM | POA: Diagnosis present

## 2016-08-17 DIAGNOSIS — G4733 Obstructive sleep apnea (adult) (pediatric): Secondary | ICD-10-CM | POA: Diagnosis present

## 2016-08-17 DIAGNOSIS — J101 Influenza due to other identified influenza virus with other respiratory manifestations: Secondary | ICD-10-CM

## 2016-08-17 DIAGNOSIS — Z85038 Personal history of other malignant neoplasm of large intestine: Secondary | ICD-10-CM | POA: Diagnosis not present

## 2016-08-17 DIAGNOSIS — I4581 Long QT syndrome: Secondary | ICD-10-CM | POA: Diagnosis present

## 2016-08-17 DIAGNOSIS — I4821 Permanent atrial fibrillation: Secondary | ICD-10-CM

## 2016-08-17 DIAGNOSIS — G9341 Metabolic encephalopathy: Secondary | ICD-10-CM | POA: Diagnosis present

## 2016-08-17 DIAGNOSIS — E860 Dehydration: Secondary | ICD-10-CM | POA: Diagnosis present

## 2016-08-17 DIAGNOSIS — I4891 Unspecified atrial fibrillation: Secondary | ICD-10-CM | POA: Diagnosis present

## 2016-08-17 DIAGNOSIS — D61818 Other pancytopenia: Secondary | ICD-10-CM | POA: Diagnosis present

## 2016-08-17 DIAGNOSIS — Z79899 Other long term (current) drug therapy: Secondary | ICD-10-CM | POA: Diagnosis not present

## 2016-08-17 LAB — CBC WITH DIFFERENTIAL/PLATELET
BASO%: 0.7 % (ref 0.0–2.0)
BASOS ABS: 0 10*3/uL (ref 0.0–0.1)
BASOS PCT: 1 %
Basophils Absolute: 0 10*3/uL (ref 0.0–0.1)
EOS ABS: 0 10*3/uL (ref 0.0–0.7)
EOS%: 0 % (ref 0.0–7.0)
Eosinophils Absolute: 0 10*3/uL (ref 0.0–0.5)
Eosinophils Relative: 0 %
HEMATOCRIT: 33.4 % — AB (ref 38.4–49.9)
HEMATOCRIT: 30.5 % — AB (ref 39.0–52.0)
HEMOGLOBIN: 10.1 g/dL — AB (ref 13.0–17.0)
HGB: 10.9 g/dL — ABNORMAL LOW (ref 13.0–17.1)
LYMPH#: 0.5 10*3/uL — AB (ref 0.9–3.3)
LYMPH%: 8.5 % — ABNORMAL LOW (ref 14.0–49.0)
LYMPHS ABS: 0.5 10*3/uL — AB (ref 0.7–4.0)
Lymphocytes Relative: 9 %
MCH: 26.1 pg — AB (ref 27.2–33.4)
MCH: 26.2 pg (ref 26.0–34.0)
MCHC: 32.6 g/dL (ref 32.0–36.0)
MCHC: 33.1 g/dL (ref 30.0–36.0)
MCV: 80.1 fL (ref 79.3–98.0)
MCV: 79 fL (ref 78.0–100.0)
MONO#: 0.5 10*3/uL (ref 0.1–0.9)
MONO%: 8.3 % (ref 0.0–14.0)
MONOS PCT: 8 %
Monocytes Absolute: 0.5 10*3/uL (ref 0.1–1.0)
NEUT#: 4.6 10*3/uL (ref 1.5–6.5)
NEUT%: 82.5 % — AB (ref 39.0–75.0)
NEUTROS ABS: 4.6 10*3/uL (ref 1.7–7.7)
NEUTROS PCT: 82 %
Platelets: 182 10*3/uL (ref 140–400)
Platelets: 156 10*3/uL (ref 150–400)
RBC: 4.17 10*6/uL — ABNORMAL LOW (ref 4.20–5.82)
RBC: 3.86 MIL/uL — AB (ref 4.22–5.81)
RDW: 22 % — ABNORMAL HIGH (ref 11.0–14.6)
RDW: 21.6 % — ABNORMAL HIGH (ref 11.5–15.5)
WBC: 5.5 10*3/uL (ref 4.0–10.3)
WBC: 5.6 10*3/uL (ref 4.0–10.5)

## 2016-08-17 LAB — INFLUENZA PANEL BY PCR (TYPE A & B)
Influenza A By PCR: POSITIVE — AB
Influenza B By PCR: NEGATIVE

## 2016-08-17 LAB — COMPREHENSIVE METABOLIC PANEL
ALBUMIN: 2.4 g/dL — AB (ref 3.5–5.0)
ALK PHOS: 136 U/L (ref 40–150)
ALT: 18 U/L (ref 0–55)
ALT: 21 U/L (ref 17–63)
ANION GAP: 8 meq/L (ref 3–11)
AST: 47 U/L — AB (ref 5–34)
AST: 55 U/L — ABNORMAL HIGH (ref 15–41)
Albumin: 2.5 g/dL — ABNORMAL LOW (ref 3.5–5.0)
Alkaline Phosphatase: 112 U/L (ref 38–126)
Anion gap: 6 (ref 5–15)
BUN: 9.7 mg/dL (ref 7.0–26.0)
BUN: 12 mg/dL (ref 6–20)
CALCIUM: 8.2 mg/dL — AB (ref 8.4–10.4)
CHLORIDE: 99 meq/L (ref 98–109)
CO2: 28 mEq/L (ref 22–29)
CO2: 28 mmol/L (ref 22–32)
Calcium: 6.9 mg/dL — ABNORMAL LOW (ref 8.9–10.3)
Chloride: 98 mmol/L — ABNORMAL LOW (ref 101–111)
Creatinine, Ser: 1.12 mg/dL (ref 0.61–1.24)
Creatinine: 1 mg/dL (ref 0.7–1.3)
EGFR: 89 mL/min/{1.73_m2} — AB (ref >90)
GFR calc Af Amer: >60 mL/min (ref >60)
GFR calc non Af Amer: >60 mL/min (ref >60)
Glucose, Bld: 71 mg/dL (ref 65–99)
Glucose: 81 mg/dl (ref 70–140)
POTASSIUM: 4.4 meq/L (ref 3.5–5.1)
Potassium: 3.9 mmol/L (ref 3.5–5.1)
Sodium: 135 mEq/L — ABNORMAL LOW (ref 136–145)
Sodium: 132 mmol/L — ABNORMAL LOW (ref 135–145)
Total Bilirubin: 0.77 mg/dL (ref 0.20–1.20)
Total Bilirubin: 1.1 mg/dL (ref 0.3–1.2)
Total Protein: 6.8 g/dL (ref 6.4–8.3)
Total Protein: 6.3 g/dL — ABNORMAL LOW (ref 6.5–8.1)

## 2016-08-17 LAB — BRAIN NATRIURETIC PEPTIDE
B NATRIURETIC PEPTIDE 5: 203 pg/mL — AB (ref 0.0–100.0)
B NATRIURETIC PEPTIDE 5: 161.4 pg/mL — AB (ref 0.0–100.0)

## 2016-08-17 LAB — URINALYSIS, ROUTINE W REFLEX MICROSCOPIC
Bilirubin Urine: NEGATIVE
Glucose, UA: NEGATIVE mg/dL
Hgb urine dipstick: NEGATIVE
Ketones, ur: NEGATIVE mg/dL
Leukocytes, UA: NEGATIVE
NITRITE: NEGATIVE
PH: 5 (ref 5.0–8.0)
Protein, ur: NEGATIVE mg/dL
SPECIFIC GRAVITY, URINE: 1.013 (ref 1.005–1.030)

## 2016-08-17 LAB — I-STAT TROPONIN, ED: TROPONIN I, POC: 0 ng/mL (ref 0.00–0.08)

## 2016-08-17 LAB — PROTIME-INR
INR: 2.76
PROTHROMBIN TIME: 29.8 s — AB (ref 11.4–15.2)

## 2016-08-17 LAB — I-STAT CG4 LACTIC ACID, ED: Lactic Acid, Venous: 0.71 mmol/L (ref 0.5–1.9)

## 2016-08-17 IMAGING — DX DG CHEST 1V PORT
1 series · 1 of 1 positions shown · non-contrast
Comparison: Chest x-ray of [DATE]

CLINICAL DATA: Follow-up from recent hospitalization for sepsis.
The patient is hypotensive and 10 sitting complains of fatigue and
fever. History of CHF, morbid obesity colonic malignancy, former
smoker.

EXAM:
PORTABLE CHEST 1 VIEW

[chest ap]
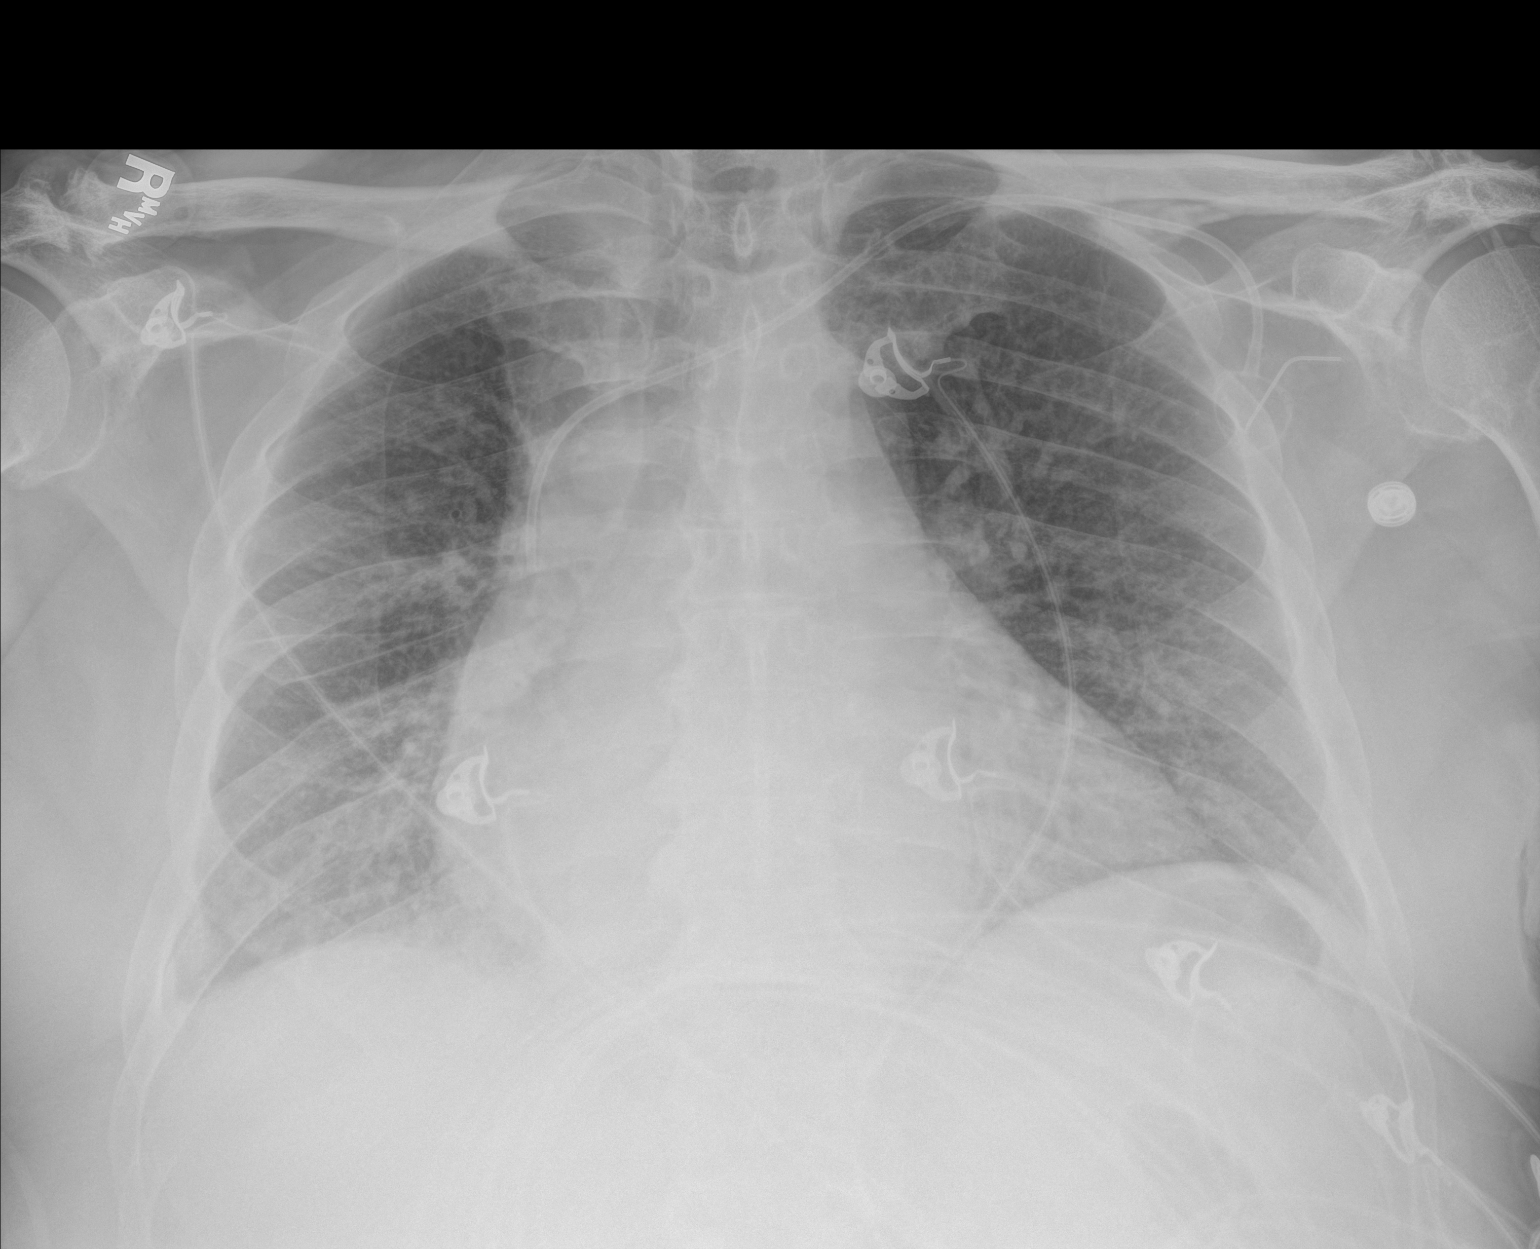

[1 of 1 positions shown; findings below may reference images not displayed]

FINDINGS: The lungs are adequately inflated. The interstitial markings are
more conspicuous bilaterally. The cardiac silhouette is enlarged and
also more conspicuous. The pulmonary vascularity is more engorged.
There is no pleural effusion. The Port-A-Cath appliance tip projects
over the midportion of the SVC.
IMPRESSION: Findings consistent with mild interstitial edema secondary to CHF.
Underlying chronic bronchitic changes. No alveolar pneumonia.

## 2016-08-17 MED ORDER — ORAL CARE MOUTH RINSE
15.0000 mL | Freq: Two times a day (BID) | OROMUCOSAL | Status: DC
  Administered 2016-08-18 – 2016-08-20 (×3): 15 mL via OROMUCOSAL

## 2016-08-17 MED ORDER — OSELTAMIVIR PHOSPHATE 75 MG PO CAPS
75.0000 mg | ORAL_CAPSULE | Freq: Two times a day (BID) | ORAL | Status: DC
  Administered 2016-08-17 – 2016-08-20 (×6): 75 mg via ORAL
  Filled 2016-08-17 (×6): qty 1

## 2016-08-17 MED ORDER — ONDANSETRON HCL 4 MG PO TABS: 4.0000 mg | ORAL_TABLET | Freq: Four times a day (QID) | ORAL | Status: DC | PRN

## 2016-08-17 MED ORDER — SODIUM CHLORIDE 0.9 % IJ SOLN
10.0000 mL | Freq: Once | INTRAMUSCULAR | Status: AC
  Administered 2016-08-17: 10 mL via INTRAVENOUS

## 2016-08-17 MED ORDER — WARFARIN - PHARMACIST DOSING INPATIENT
Freq: Every day | Status: DC
  Administered 2016-08-19: 18:00:00

## 2016-08-17 MED ORDER — METOPROLOL TARTRATE 25 MG PO TABS
25.0000 mg | ORAL_TABLET | Freq: Two times a day (BID) | ORAL | Status: DC
  Administered 2016-08-17 – 2016-08-20 (×6): 25 mg via ORAL
  Filled 2016-08-17 (×6): qty 1

## 2016-08-17 MED ORDER — LEVALBUTEROL HCL 0.63 MG/3ML IN NEBU
0.6300 mg | INHALATION_SOLUTION | Freq: Three times a day (TID) | RESPIRATORY_TRACT | Status: DC
  Administered 2016-08-18 – 2016-08-20 (×8): 0.63 mg via RESPIRATORY_TRACT
  Filled 2016-08-17 (×8): qty 3

## 2016-08-17 MED ORDER — FERROUS SULFATE 325 (65 FE) MG PO TABS
325.0000 mg | ORAL_TABLET | Freq: Two times a day (BID) | ORAL | Status: DC
  Administered 2016-08-18 – 2016-08-20 (×5): 325 mg via ORAL
  Filled 2016-08-17 (×5): qty 1

## 2016-08-17 MED ORDER — ACETAMINOPHEN 650 MG RE SUPP: 650.0000 mg | Freq: Four times a day (QID) | RECTAL | Status: DC | PRN

## 2016-08-17 MED ORDER — SODIUM CHLORIDE 0.9 % IV BOLUS (SEPSIS)
1000.0000 mL | Freq: Once | INTRAVENOUS | Status: AC
  Administered 2016-08-17: 1000 mL via INTRAVENOUS

## 2016-08-17 MED ORDER — SODIUM CHLORIDE 0.9 % IV SOLN
2000.0000 mg | Freq: Once | INTRAVENOUS | Status: AC
  Administered 2016-08-17: 2000 mg via INTRAVENOUS
  Filled 2016-08-17: qty 2000

## 2016-08-17 MED ORDER — LEVALBUTEROL HCL 0.63 MG/3ML IN NEBU
0.6300 mg | INHALATION_SOLUTION | Freq: Four times a day (QID) | RESPIRATORY_TRACT | Status: DC
  Administered 2016-08-17: 0.63 mg via RESPIRATORY_TRACT
  Filled 2016-08-17: qty 3

## 2016-08-17 MED ORDER — GUAIFENESIN ER 600 MG PO TB12
600.0000 mg | ORAL_TABLET | Freq: Two times a day (BID) | ORAL | Status: DC
  Administered 2016-08-17 – 2016-08-20 (×6): 600 mg via ORAL
  Filled 2016-08-17 (×6): qty 1

## 2016-08-17 MED ORDER — WARFARIN SODIUM 4 MG PO TABS
4.0000 mg | ORAL_TABLET | Freq: Once | ORAL | Status: AC
  Administered 2016-08-17: 4 mg via ORAL
  Filled 2016-08-17 (×2): qty 1

## 2016-08-17 MED ORDER — BISACODYL 10 MG RE SUPP: 10.0000 mg | Freq: Every day | RECTAL | Status: DC | PRN

## 2016-08-17 MED ORDER — ASPIRIN EC 81 MG PO TBEC
81.0000 mg | DELAYED_RELEASE_TABLET | Freq: Every day | ORAL | Status: DC
Start: 2016-08-18 — End: 2016-08-20
  Administered 2016-08-18 – 2016-08-20 (×3): 81 mg via ORAL
  Filled 2016-08-17 (×3): qty 1

## 2016-08-17 MED ORDER — POLYETHYLENE GLYCOL 3350 17 G PO PACK
17.0000 g | PACK | Freq: Every day | ORAL | Status: DC | PRN
Start: 2016-08-17 — End: 2016-08-20

## 2016-08-17 MED ORDER — TRAMADOL HCL 50 MG PO TABS
50.0000 mg | ORAL_TABLET | Freq: Four times a day (QID) | ORAL | Status: DC | PRN
Start: 2016-08-17 — End: 2016-08-20
  Administered 2016-08-20: 50 mg via ORAL
  Filled 2016-08-17: qty 1

## 2016-08-17 MED ORDER — LEVALBUTEROL HCL 0.63 MG/3ML IN NEBU
0.6300 mg | INHALATION_SOLUTION | Freq: Once | RESPIRATORY_TRACT | Status: AC
  Administered 2016-08-17: 0.63 mg via RESPIRATORY_TRACT
  Filled 2016-08-17: qty 3

## 2016-08-17 MED ORDER — SODIUM CHLORIDE 0.9% FLUSH
3.0000 mL | Freq: Two times a day (BID) | INTRAVENOUS | Status: DC
  Administered 2016-08-17 – 2016-08-19 (×2): 3 mL via INTRAVENOUS

## 2016-08-17 MED ORDER — VANCOMYCIN HCL IN DEXTROSE 750-5 MG/150ML-% IV SOLN
750.0000 mg | Freq: Two times a day (BID) | INTRAVENOUS | Status: DC
  Administered 2016-08-18: 750 mg via INTRAVENOUS
  Filled 2016-08-17: qty 150

## 2016-08-17 MED ORDER — ACETAMINOPHEN 325 MG PO TABS
650.0000 mg | ORAL_TABLET | Freq: Four times a day (QID) | ORAL | Status: DC | PRN
  Administered 2016-08-18 (×2): 650 mg via ORAL
  Filled 2016-08-17 (×2): qty 2

## 2016-08-17 MED ORDER — DEXTROSE 5 % IV SOLN
2.0000 g | Freq: Once | INTRAVENOUS | Status: AC
  Administered 2016-08-17: 2 g via INTRAVENOUS
  Filled 2016-08-17: qty 2

## 2016-08-17 MED ORDER — ONDANSETRON HCL 4 MG/2ML IJ SOLN: 4.0000 mg | Freq: Four times a day (QID) | INTRAMUSCULAR | Status: DC | PRN

## 2016-08-17 MED ORDER — DEXTROSE 5 % IV SOLN
1.0000 g | Freq: Three times a day (TID) | INTRAVENOUS | Status: DC
  Administered 2016-08-17 – 2016-08-18 (×2): 1 g via INTRAVENOUS
  Filled 2016-08-17 (×2): qty 1

## 2016-08-17 MED ORDER — SODIUM CHLORIDE 0.9 % IV SOLN
INTRAVENOUS | Status: DC
  Administered 2016-08-17 – 2016-08-18 (×2): via INTRAVENOUS

## 2016-08-17 MED ORDER — SODIUM CHLORIDE 0.9 % IV BOLUS (SEPSIS)
500.0000 mL | Freq: Once | INTRAVENOUS | Status: AC
  Administered 2016-08-17: 500 mL via INTRAVENOUS

## 2016-08-17 NOTE — Telephone Encounter (Signed)
Spoke with wife Ronnette Juniper, and informed her re:  Per Dr. Benay Spice,  Pt can come for lab today, and pt will be contacted with further instructions.   Encouraged pt to keep appt with cardiologist today, and to have provider evaluate pt while there. Ronnette Juniper voiced understanding, and aware of lab appt today.

## 2016-08-17 NOTE — Patient Instructions (Signed)
GO TO [place]

## 2016-08-17 NOTE — Telephone Encounter (Signed)
Received call from wife Ronnette Juniper requesting a call back from nurse.  Spoke with Ronnette Juniper, and was informed that pt was discharged from the hospital on Thursday  08/11/16 - had Noro Virus , was in ICU for couple of days.   Since pt was discharged home, he is still weak, has no appetite, has shortness of breath, productive cough with clear phlegm, has nausea without vomiting.   Per wife, pt can drink fluids fine.   Last night, pt had temp of  101.5  ;  This am  Had temp  100.5 . Noted last chemo FOLFOX on  07/28/16. Dr. Benay Spice notified - in Dr. Ernestina Penna absence. Pt's    Phone    [phone].

## 2016-08-17 NOTE — ED Triage Notes (Signed)
Per EMS- patient was at Cardiologist for follow up post recent hospitalization for sepsis. Patient found to to be hypotensive, c/o fatigue, and fever. Patient was given NS 250 ml.

## 2016-08-17 NOTE — ED Notes (Signed)
Called floor pt room ready and nurse ready in 15 mins.

## 2016-08-17 NOTE — Progress Notes (Signed)
Cardiology Office Note    Date:  08/17/2016   ID:  Steven Barnett, DOB 03-Aug-1950, MRN [id number]  PCP:  Elisabeth Cara, PA-C  Cardiologist:  Dr. [name]  Chief Complaint  Patient presents with  . Hospitalization Follow-up    seen for     History of Present Illness:  Steven Barnett is a 66 y.o. male with PMH of HTN, OSA, chronic diastolic HF with moderate LVH and post op atrial fibrillation. He had right-sided empyema in May 2010, he underwent a thoracotomy at this time. He was found to be in atrial fibrillation postoperatively and has been in this rhythm ever since. At that time, he was also diagnosed with diastolic heart failure and found to have moderate LV hypertrophy by echocardiogram.  Unfortunately he was incarcerated for several years and he was lost to follow-up. He later reestablished with cardiology. Last year, there was some confusion about his medication and noncompliance. On the last follow-up in October, he was doing better after reinitiating medications. Blood pressure was also well-controlled. Unfortunately, he was also found to have colorectal cancer by Dr. [name], he underwent laparoscopic partial colectomy on 04/12/2016. Afterward, he received him on radiation therapy. He most recently admitted to the hospital on 08/06/2016 with somnolence and hypotension. He was admitted by pulmonary critical care service and treated for septic shock requiring pressor secondary to acute gastroenteritis with history of cecal cancer. Influenza test negative, but he was positive for Norovirus. He had acute kidney injury (initial Cr 3.37), hypokalemia (K2.5) and lactic acidosis (3.77). His Cardizem was held, metoprolol decreased to 12.5 mg twice a day. It appears his Coumadin was also held prior to discharge as his INR of 6.1.  Patient presents today for post hospital follow-up. He has restarted coumadin. He looks extremely weak today, sitting in the wheelchair. According to the full note, he  had a fever while 101.5 last night and this morning was 100.5. We checked another temperature in office, 99.8. Since his discharge, he continued to have nausea and abdominal discomfort. Note, he is currently receiving chemotherapy. From the previous hospital visit, apparently his white blood cell count on arrival was severely low at 0.8, time he was discharged, his white blood cell count was 5.5. He does not appear to be fluid overloaded. He did take his blood pressure medication this morning including hydralazine 50 mg, lisinopril 20 mg, and metoprolol 25 mg around 11 AM this morning. Otherwise, he is complaining of worsening appetite, malaise, fever and chill. Blood pressure on first arrival was 78/54. Heart rate was 56, however on physical exam, his heart rate appears to be faster than 56, and currently repeating EKG. I have personally manually checked his blood pressure which was 70/50. This patient looks acutely ill, I do not think IV fluid in the office is coming to help him in the long run, however we will start peripheral IV and give him IV fluid. With his recent cancer on chemotherapy, he would be at higher chance of having recurring infection. I'm concerned he may need pressor treatment as well. I do not think this can be treated as outpatient. After discussing with DOD Dr. Ellyn Hack, we plan to send the patient to the hospital.   Past Medical History:  Diagnosis Date  . Anemia   . Arthralgia of both knees   . Arthritis    "right knee" (10/01/2015)  . Atrial fibrillation ([place])   . Cancer (HCC)    STAGE 1 COLON CANCER  .  Chronic anticoagulation 2010   Coumadin  . Chronic diastolic CHF (congestive heart failure), NYHA class 2 ([place])   . Hypertension   . OSA (obstructive sleep apnea) 2016   "couldn't take the mask during the testing" (10/01/2015)  . Permanent atrial fibrillation ([place]) 2010  . Pneumonia 3/29/2017and june 2017    Past Surgical History:  Procedure Laterality Date  .  COLONOSCOPY WITH PROPOFOL N/A 03/04/2016   Procedure: COLONOSCOPY WITH PROPOFOL;  Surgeon: Carol Ada, MD;  Location: [facility];  Service: Endoscopy;  Laterality: N/A;  . LAPAROSCOPIC PARTIAL COLECTOMY N/A 04/12/2016   Procedure: LAPAROSCOPIC ILEOCOLECTOMY;  Surgeon: [name], MD;  Location: [facility];  Service: General;  Laterality: N/A;  . PORTACATH PLACEMENT N/A 05/18/2016   Procedure: INSERTION PORT-A-CATH;  Surgeon: [name], MD;  Location: [facility];  Service: General;  Laterality: N/A;  . THORACOTOMY Right 2010    Current Medications: Outpatient Medications Prior to Visit  Medication Sig Dispense Refill  . acetaminophen (TYLENOL) 500 MG tablet Take 1,000 mg by mouth 2 (two) times daily.    Marland Kitchen albuterol (PROVENTIL HFA;VENTOLIN HFA) 108 (90 Base) MCG/ACT inhaler Inhale 2 puffs into the lungs every 6 (six) hours as needed for wheezing or shortness of breath. 1 Inhaler 2  . aspirin EC 81 MG tablet Take 81 mg by mouth daily.    Marland Kitchen docusate sodium (COLACE) 100 MG capsule Take 1 capsule (100 mg total) by mouth 2 (two) times daily. For constipation 60 capsule 3  . ferrous sulfate 325 (65 FE) MG tablet Take 1 tablet (325 mg total) by mouth 2 (two) times daily with a meal. 60 tablet 3  . furosemide (LASIX) 80 MG tablet Take 1 tablet (80 mg total) by mouth daily.    . hydrALAZINE (APRESOLINE) 50 MG tablet TAKE ONE TABLET BY MOUTH TWICE DAILY 60 tablet 6  . ipratropium (ATROVENT HFA) 17 MCG/ACT inhaler Inhale 2 puffs into the lungs every 6 (six) hours as needed for wheezing.     . lidocaine-prilocaine (EMLA) cream Apply to affected area once (Patient taking differently: Apply 1 application topically once. Apply to affected area once) 30 g 3  . lisinopril (PRINIVIL,ZESTRIL) 20 MG tablet TAKE ONE TABLET BY MOUTH ONCE DAILY 30 tablet 10  . LORazepam (ATIVAN) 0.5 MG tablet Take 1 tablet (0.5 mg total) by mouth every 6 (six) hours as needed (Nausea or vomiting). 30 tablet 0  .  metoprolol tartrate (LOPRESSOR) 25 MG tablet TAKE ONE TABLET BY MOUTH TWICE DAILY 30 tablet 6  . ondansetron (ZOFRAN) 8 MG tablet Take 1 tablet (8 mg total) by mouth 2 (two) times daily as needed for refractory nausea / vomiting. Start on day 3 after chemotherapy. 30 tablet 1  . potassium chloride SA (K-DUR,KLOR-CON) 20 MEQ tablet Take 2 tablets (40 mEq total) by mouth daily. 180 tablet 3  . prochlorperazine (COMPAZINE) 10 MG tablet Take 1 tablet (10 mg total) by mouth every 6 (six) hours as needed (Nausea or vomiting). (Patient taking differently: Take 10 mg by mouth every 6 (six) hours as needed for nausea or vomiting. ) 30 tablet 1  . sildenafil (REVATIO) 20 MG tablet TAKE TWO TO THREE TABLETS BY MOUTH ONCE DAILY AS NEEDED FOR SEXUAL ACTIVITY 30 tablet 2   No facility-administered medications prior to visit.      Allergies:   No known allergies   Social History   Social History  . Marital status: Married    Spouse name: Ronnette Juniper  . Number  of children: N/A  . Years of education: N/A   Social History Main Topics  . Smoking status: Former Smoker    Packs/day: 0.10    Years: 2.00    Types: Cigarettes    Quit date: 12/16/1974  . Smokeless tobacco: Never Used     Comment: "1 pack cigarettes would last me a month"  . Alcohol use 0.0 oz/week     Comment: 10/01/2015 "might have1 6 pack of beer/year"  . Drug use: Yes    Types: Cocaine, Marijuana     Comment: Hx polysubstance abuse, quit 2008  . Sexual activity: Not Currently   Other Topics Concern  . None   Social History Narrative   Married, wife Ronnette Juniper (since 70)     Family History:  The patient's family history includes Bone cancer in his maternal uncle; Cancer in his maternal uncle; Colon cancer in his sister and sister; Colon cancer (age of onset: 72) in his sister; Colon polyps in his brother, father, and mother; Dementia in his mother; Diabetes in his mother; Heart disease in his father; Heart failure in his father;  Hyperlipidemia in his mother; Hypertension in his mother; Lung cancer in his paternal grandfather; Stroke in his maternal grandmother and sister.   ROS:   Please see the history of present illness.    ROS All other systems reviewed and are negative.   PHYSICAL EXAM:   VS:  BP (!) 78/54   Pulse (!) 56   Ht 5\' 10"  (1.778 m)   Wt 237 lb 12.8 oz (107.9 kg)   BMI 34.12 kg/m    GEN: Weak, acutely ill HEENT: normal  Neck: no JVD, carotid bruits, or masses Cardiac: irregularly irregular; no murmurs, rubs, or gallops,no edema  Respiratory:  clear to auscultation bilaterally, normal work of breathing GI: soft, nontender, nondistended, + BS MS: no deformity or atrophy  Skin: warm and dry, no rash Neuro:  Alert and Oriented x 3, Strength and sensation are intact Psych: euthymic mood, full affect  Wt Readings from Last 3 Encounters:  08/17/16 237 lb 12.8 oz (107.9 kg)  08/11/16 244 lb 12.8 oz (111 kg)  07/28/16 258 lb 1.6 oz (117.1 kg)      Studies/Labs Reviewed:   EKG:  EKG is ordered today.  The ekg ordered today demonstrates Atrial fibrillation with RVR with heart rate of 117.  Recent Labs: 02/02/2016: TSH 0.672 08/08/2016: B Natriuretic Peptide 54.6 08/11/2016: Magnesium 2.1 08/17/2016: ALT 18; BUN 9.7; Creatinine 1.0; HGB 10.9; Platelets 182; Potassium 4.4; Sodium 135   Lipid Panel    Component Value Date/Time   CHOL  11/20/2008 0420    93        ATP III CLASSIFICATION:  <200     mg/dL   Desirable  200-239  mg/dL   Borderline High  >=240    mg/dL   High          TRIG 31 11/20/2008 0420   HDL 42 11/20/2008 0420   CHOLHDL 2.2 11/20/2008 0420   VLDL 6 11/20/2008 0420   LDLCALC  11/20/2008 0420    45        Total Cholesterol/HDL:CHD Risk Coronary Heart Disease Risk Table                     Men   Women  1/2 Average Risk   3.4   3.3  Average Risk       5.0   4.4  2 X Average Risk  9.6   7.1  3 X Average Risk  23.4   11.0        Use the calculated Patient Ratio above  and the CHD Risk Table to determine the patient's CHD Risk.        ATP III CLASSIFICATION (LDL):  <100     mg/dL   Optimal  100-129  mg/dL   Near or Above                    Optimal  130-159  mg/dL   Borderline  160-189  mg/dL   High  >190     mg/dL   Very High    Additional studies/ records that were reviewed today include:   Echo 07/09/2015 LV EF: 55% -   60%  - Left ventricle: The cavity size was normal. Wall thickness was   increased in a pattern of moderate LVH. Systolic function was   normal. The estimated ejection fraction was in the range of 55%   to 60%. Wall motion was normal; there were no regional wall   motion abnormalities. The study is not technically sufficient to   allow evaluation of LV diastolic function. - Aortic valve: Trileaflet. Sclerosis without stenosis. There was   no regurgitation. - Mitral valve: There was mild regurgitation. - Left atrium: Moderately dilated at 45 ml/m2. - Right ventricle: The cavity size was mildly dilated. - Right atrium: Moderate to severely dilated. - Inferior vena cava: The vessel was normal in size. The   respirophasic diameter changes were in the normal range (>= 50%),   consistent with normal central venous pressure.  Impressions:  - Compared to the prior echo in 2010, LVEF remains unchanged.   ASSESSMENT:    1. Permanent atrial fibrillation ([place])   2. Hypotension, unspecified hypotension type   3. Fever, unspecified fever cause   4. Colorectal cancer ([place])   5. Chronic diastolic heart failure (HCC)      PLAN:  In order of problems listed above:  1. Permanent atrial fibrillation: His Coumadin was recently held upon discharge as his INR went up to 6.1, since then he has been restarted on Coumadin. His rate control medication has also been cut back during the last hospitalization. Given fever, hypotension and reduction of rate control medication, I am not too surprised his heart rate is 120 today. I do not think  his heart rate is the major contributor of his current symptom as it does not explain his fever.  2. Hypotension: Initial blood pressure was 78/54, on manual repeat by me was 70/50. He appears to be acutely ill, he recently just recovered from septic shock. Since then, he has recurrent fever last night, he is having chills, nausea, malaise, and loss of appetite. I do not think this can be managed as outpatient, after discussing with M.D., we plan to send the patient to [facility] for further management.  - Once arrived at [facility], he should have standard CBC, CMP, flu panel. He will need to hold all blood pressure medications, as blood pressure improved, he can restart on metoprolol. Would recommend continue to hold lisinopril and hydralazine until outpatient follow-up.  - We have started IV in the office and initiated 500 mL of IV bolus.  3. Colorectal cancer: On chemotherapy and radiation therapy. During the previous hospitalization, at one point, his white blood cell count was as low as 0.8, it went up to 5 upon release. Some of this may  be related to infection in combination with chemotherapy.  4. Chronic diastolic heart failure: He does not appears to be volume overloaded, I would not hold back on giving him IV fluid at this point. Recommend hold Lasix until his blood pressure improved.    Medication Adjustments/Labs and Tests Ordered: Current medicines are reviewed at length with the patient today.  Concerns regarding medicines are outlined above.  Medication changes, Labs and Tests ordered today are listed in the Patient Instructions below. Patient Instructions  GO TO [place]    Signed, Almyra Deforest, [place]  08/17/2016 2:52 PM    [facility] [address], [place], [place]  [address] Phone: [phone]; Fax: [phone]

## 2016-08-17 NOTE — ED Notes (Signed)
Respiratory therapy called for Neb treatment

## 2016-08-17 NOTE — Progress Notes (Signed)
ANTICOAGULATION CONSULT NOTE - Initial Consult  Pharmacy Consult for  warfarin Indication: atrial fibrillation  Allergies  Allergen Reactions  . No Known Allergies     Patient Measurements: Height: 5\' 10"  (177.8 cm) Weight: 237 lb 12 oz (107.8 kg) IBW/kg (Calculated) : 73 Heparin Dosing Weight:   Vital Signs: Temp: 99.2 F (37.3 C) (02/14 1652) Temp Source: Oral (02/14 1652) BP: 108/79 (02/14 1900) Pulse Rate: 92 (02/14 1900)  Labs:  Recent Labs  08/17/16 1214 08/17/16 1623  HGB 10.9* 10.1*  HCT 33.4* 30.5*  PLT 182 156  LABPROT  --  29.8*  INR  --  2.76  CREATININE 1.0 1.12    Estimated Creatinine Clearance: 81.9 mL/min (by C-G formula based on SCr of 1.12 mg/dL).   Medical History: Past Medical History:  Diagnosis Date  . Anemia   . Arthralgia of both knees   . Arthritis    "right knee" (10/01/2015)  . Atrial fibrillation ([place])   . Cancer (HCC)    STAGE 1 COLON CANCER  . Chronic anticoagulation 2010   Coumadin  . Chronic diastolic CHF (congestive heart failure), NYHA class 2 ([place])   . Hypertension   . OSA (obstructive sleep apnea) 2016   "couldn't take the mask during the testing" (10/01/2015)  . Permanent atrial fibrillation ([place]) 2010  . Pneumonia 3/29/2017and june 2017    Medications:  Scheduled:  . oseltamivir  75 mg Oral BID  . [START ON 08/18/2016] vancomycin  750 mg Intravenous Q12H    Assessment: Pharmacy is consulted to dose warfarin in 66 yo male diagnosed with afib.  Last dose 2/13. INR supratherapeutic at recent INR checks, 6.1 on 2/9 Pt was on 8mg  daily but now reducing dose after that INR level. Was told to hold warfarin fri, sat, sun and take 4mg  Mon, 8mg  Tue and Wed, 4mg  Thur and get INR checked 2/16. Target INR is 2-3.   Today, 08/17/16   INR 2.76, therapeutic  Hgb 10.1, plt 156  Pt 29.8   Goal of Therapy:  INR 2-3 Monitor platelets by anticoagulation protocol: Yes   Plan:   Warfarin 4 mg PO x1   Daily INR  ordered  CBC ordered with AM labs  Monitor for signs and symptoms of bleeding   Royetta Asal, PharmD, BCPS Pager [phone] 08/17/2016 7:31 PM

## 2016-08-17 NOTE — H&P (Signed)
History and Physical    Steven Barnett [facility]:[id number] DOB: 07-09-1950 DOA: 08/17/2016  PCP: Elisabeth Cara, PA-C   Patient coming from: Home; Sent from Cardiology office for Hypotension  Chief Complaint: Weakness, Poor Appetite  HPI: Steven Barnett is a 66 y.o. male with medical history significant of HTN, Stage III Colon Cancer s/p Right Hemicolectomy on Chemotherapy with FOLFOX, Chronic Atrial Fibrillation, OSA on CPAP, Anemia, Chronic Diastolic Heart Failure who was just recently in the hospital with Norovirus and Sepsis. Patient was at his Cardiologist this AM when he was noted to be hypotensive so he was given a bolus of NS and sent to the ED for evaluation. Per pateint he has not been feeling well for the last few days with extremely poor appetitie and po intake and has had generalized weakness and fatigue. Wife noted that he had a fever of 102 last night. Has had some coughing but sputum is clear/whitish colored. Has not had a proper meal in 3 days and has been taking his meds on empty stomach. No Nausea/Vomiting or Diarrhea. Admits to being around his grandson who has been sick.  ED Course: Sepsis protocol was initiated and was given 1 liter of IVF. Had bloodwork done and was found to be positive for Influenza.   Review of Systems: As per HPI otherwise 10 point review of systems negative. No CP/SOB. States he has not gained and weight but has lost 20 pounds over the last few weeks of being hospitalized.   Past Medical History:  Diagnosis Date  . Anemia   . Arthralgia of both knees   . Arthritis    "right knee" (10/01/2015)  . Atrial fibrillation ([place])   . Cancer (HCC)    STAGE 1 COLON CANCER  . Chronic anticoagulation 2010   Coumadin  . Chronic diastolic CHF (congestive heart failure), NYHA class 2 ([place])   . Hypertension   . OSA (obstructive sleep apnea) 2016   "couldn't take the mask during the testing" (10/01/2015)  . Permanent atrial fibrillation ([place]) 2010  .  Pneumonia 3/29/2017and june 2017   Past Surgical History:  Procedure Laterality Date  . COLONOSCOPY WITH PROPOFOL N/A 03/04/2016   Procedure: COLONOSCOPY WITH PROPOFOL;  Surgeon: Carol Ada, MD;  Location: [facility];  Service: Endoscopy;  Laterality: N/A;  . LAPAROSCOPIC PARTIAL COLECTOMY N/A 04/12/2016   Procedure: LAPAROSCOPIC ILEOCOLECTOMY;  Surgeon: [name], MD;  Location: [facility];  Service: General;  Laterality: N/A;  . PORTACATH PLACEMENT N/A 05/18/2016   Procedure: INSERTION PORT-A-CATH;  Surgeon: [name], MD;  Location: [facility];  Service: General;  Laterality: N/A;  . THORACOTOMY Right 2010   SOCIAL HISTORY  reports that he quit smoking about 41 years ago. His smoking use included Cigarettes. He has a 0.20 pack-year smoking history. He has never used smokeless tobacco. He reports that he drinks alcohol. He reports that he uses drugs, including Cocaine and Marijuana.  Allergies  Allergen Reactions  . No Known Allergies     Family History  Problem Relation Age of Onset  . Heart disease Father   . Heart failure Father   . Colon polyps Father     unspecified number - pt of intestine surgically resected  . Hypertension Mother   . Diabetes Mother   . Hyperlipidemia Mother   . Colon polyps Mother     unspecified number - pt of intestine surgically resected  . Dementia Mother     d. 68  . Stroke Maternal  Grandmother   . Stroke Sister   . Colon cancer Sister 73    w/ "cancerous polyps" - unspecified number; underwent surgery and radiation  . Colon polyps Brother     unspecified number - polypectomies  . Colon cancer Sister     dx 4-60; s/p surgery and radiation  . Bone cancer Maternal Uncle     dx. 45s  . Lung cancer Paternal Grandfather     d. 80s-90s; lung cancer vs TB  . Colon cancer Sister     dx. 43-60; s/p surgery and radiation  . Cancer Maternal Uncle     mother had about 7-8 other siblings, most passed at older ages, many of whom  had some type of cancer  . Heart attack Neg Hx     Prior to Admission medications   Medication Sig Start Date End Date Taking? Authorizing Provider  acetaminophen (TYLENOL) 500 MG tablet Take 1,000 mg by mouth 2 (two) times daily.   Yes Historical Provider, MD  albuterol (PROVENTIL HFA;VENTOLIN HFA) 108 (90 Base) MCG/ACT inhaler Inhale 2 puffs into the lungs every 6 (six) hours as needed for wheezing or shortness of breath. 10/01/15  Yes Nita Sells, MD  aspirin EC 81 MG tablet Take 81 mg by mouth daily.   Yes Historical Provider, MD  docusate sodium (COLACE) 100 MG capsule Take 1 capsule (100 mg total) by mouth 2 (two) times daily. For constipation 07/12/15  Yes Ripudeep Krystal Eaton, MD  ferrous sulfate 325 (65 FE) MG tablet Take 1 tablet (325 mg total) by mouth 2 (two) times daily with a meal. 02/06/16  Yes Hosie Poisson, MD  furosemide (LASIX) 80 MG tablet Take 1 tablet (80 mg total) by mouth daily. 08/11/16  Yes Debbe Odea, MD  hydrALAZINE (APRESOLINE) 50 MG tablet TAKE ONE TABLET BY MOUTH TWICE DAILY 03/29/16  Yes Pixie Casino, MD  ipratropium (ATROVENT HFA) 17 MCG/ACT inhaler Inhale 2 puffs into the lungs every 6 (six) hours as needed for wheezing.    Yes Historical Provider, MD  lisinopril (PRINIVIL,ZESTRIL) 20 MG tablet TAKE ONE TABLET BY MOUTH ONCE DAILY 04/29/16  Yes Pixie Casino, MD  metoprolol tartrate (LOPRESSOR) 25 MG tablet TAKE ONE TABLET BY MOUTH TWICE DAILY 07/05/16  Yes Pixie Casino, MD  potassium chloride SA (K-DUR,KLOR-CON) 20 MEQ tablet Take 2 tablets (40 mEq total) by mouth daily. 12/16/15  Yes Pixie Casino, MD  sildenafil (REVATIO) 20 MG tablet TAKE TWO TO THREE TABLETS BY MOUTH ONCE DAILY AS NEEDED FOR SEXUAL ACTIVITY 06/06/16  Yes Pixie Casino, MD  warfarin (COUMADIN) 4 MG tablet Take 4 mg by mouth daily. Monday- 1 tab Tuesday, wednesday- 2 tabs Thursday- 1 tab Friday, Saturday, Sunday- no coumadin   Yes Historical Provider, MD  lidocaine-prilocaine (EMLA) cream  Apply to affected area once Patient taking differently: Apply 1 application topically once. Apply to affected area once 05/11/16   [name], MD  LORazepam (ATIVAN) 0.5 MG tablet Take 1 tablet (0.5 mg total) by mouth every 6 (six) hours as needed (Nausea or vomiting). Patient not taking: Reported on 08/17/2016 05/11/16   [name], MD  ondansetron (ZOFRAN) 8 MG tablet Take 1 tablet (8 mg total) by mouth 2 (two) times daily as needed for refractory nausea / vomiting. Start on day 3 after chemotherapy. Patient not taking: Reported on 08/17/2016 05/11/16   [name], MD  prochlorperazine (COMPAZINE) 10 MG tablet Take 1 tablet (10 mg total) by mouth every 6 (six) hours  as needed (Nausea or vomiting). Patient not taking: Reported on 08/17/2016 05/11/16   [name], MD    Physical Exam: Vitals:   08/17/16 1700 08/17/16 1715 08/17/16 1730 08/17/16 1745  BP: 114/77 126/82 106/77 106/70  Pulse: 93 103 107 97  Resp: 23 26 (!) 27 (!) 27  Temp:      TempSrc:      SpO2: 100% 100% 100% 100%  Weight:      Height:       Constitutional: WN/WD AAM, NAD but appears not well Eyes: Lids and conjunctivae normal, sclerae anicteric  ENMT: External Ears, Nose appear normal. Grossly normal hearing. Mucous membranes dry.  Neck: Appears normal, supple, no cervical masses, normal ROM, no appreciable thyromegaly, no JVD Respiratory: Diminished bilaterally with expiratory wheezing and rhonchi. No crackles or rales. Slightly increased respiratory effort and no accessory muscle use. Patient is wearing supplemental O2 via [place].   Cardiovascular: Irregularly Irregular, no murmurs / rubs / gallops. S1 and S2 auscultated. No extremity edema Chest Wall: Chemo Port in Place on Left Side; not Erythematous or warm Abdomen: Soft, non-tender, non-distended. No masses palpated. No appreciable hepatosplenomegaly. Bowel sounds positive x4.  GU: Deferred. Musculoskeletal: No clubbing / cyanosis of digits/nails. No joint deformity upper and  lower extremities. Good ROM, No Contractures Skin: No rashes, lesions, ulcers on limited skin evaluation.  Neurologic: CN 2-12 grossly intact with no focal deficits. Romberg sign cerebellar reflexes not assessed.  Psychiatric: Normal judgment and insight. Alert and oriented x 3. Normal mood and appropriate affect.   Labs on Admission: I have personally reviewed following labs and imaging studies  CBC:  Recent Labs Lab 08/17/16 1214 08/17/16 1623  WBC 5.5 5.6  NEUTROABS 4.6 4.6  HGB 10.9* 10.1*  HCT 33.4* 30.5*  MCV 80.1 79.0  PLT 182 950   Basic Metabolic Panel:  Recent Labs Lab 08/10/16 1925 08/11/16 0458 08/12/16 1431 08/17/16 1214 08/17/16 1623  NA  --  146* 146 135* 132*  K  --  3.2* 4.2 4.4 3.9  CL  --  123* 119*  --  98*  CO2  --  17* 20 28 28   GLUCOSE  --  99 84 81 71  BUN  --  10 13 9.7 12  CREATININE  --  1.06 0.96 1.0 1.12  CALCIUM  --  8.1* 7.6* 8.2* 6.9*  MG  --  2.1  --   --   --   PHOS 1.3* 2.7  --   --   --    GFR: Estimated Creatinine Clearance: 81.9 mL/min (by C-G formula based on SCr of 1.12 mg/dL). Liver Function Tests:  Recent Labs Lab 08/17/16 1214 08/17/16 1623  AST 47* 55*  ALT 18 21  ALKPHOS 136 112  BILITOT 0.77 1.1  PROT 6.8 6.3*  ALBUMIN 2.4* 2.5*   No results for input(s): LIPASE, AMYLASE in the last 168 hours. No results for input(s): AMMONIA in the last 168 hours. Coagulation Profile:  Recent Labs Lab 08/11/16 0458 08/12/16 1343 08/12/16 1423  INR 4.75* 6.9 6.1*   Cardiac Enzymes: No results for input(s): CKTOTAL, CKMB, CKMBINDEX, TROPONINI in the last 168 hours. BNP (last 3 results) No results for input(s): PROBNP in the last 8760 hours. HbA1C: No results for input(s): HGBA1C in the last 72 hours. CBG: No results for input(s): GLUCAP in the last 168 hours. Lipid Profile: No results for input(s): CHOL, HDL, LDLCALC, TRIG, CHOLHDL, LDLDIRECT in the last 72 hours. Thyroid Function Tests: No results  for input(s):  TSH, T4TOTAL, FREET4, T3FREE, THYROIDAB in the last 72 hours. Anemia Panel: No results for input(s): VITAMINB12, FOLATE, FERRITIN, TIBC, IRON, RETICCTPCT in the last 72 hours. Urine analysis:    Component Value Date/Time   COLORURINE YELLOW 08/17/2016 1623   APPEARANCEUR HAZY (A) 08/17/2016 1623   LABSPEC 1.013 08/17/2016 1623   PHURINE 5.0 08/17/2016 1623   GLUCOSEU NEGATIVE 08/17/2016 1623   HGBUR NEGATIVE 08/17/2016 1623   BILIRUBINUR NEGATIVE 08/17/2016 1623   KETONESUR NEGATIVE 08/17/2016 1623   PROTEINUR NEGATIVE 08/17/2016 1623   NITRITE NEGATIVE 08/17/2016 1623   LEUKOCYTESUR NEGATIVE 08/17/2016 1623   Sepsis Labs: !!!!!!!!!!!!!!!!!!!!!!!!!!!!!!!!!!!!!!!!!!!! @LABRCNTIP (procalcitonin:4,lacticidven:4) )No results found for this or any previous visit (from the past 240 hour(s)).   Radiological Exams on Admission: Dg Chest Port 1 View  Result Date: 08/17/2016 CLINICAL DATA:  Follow-up from recent hospitalization for sepsis. The patient is hypotensive and 10 sitting complains of fatigue and fever. History of CHF, morbid obesity colonic malignancy, former smoker. EXAM: PORTABLE CHEST 1 VIEW COMPARISON:  Chest x-ray of August 06, 2016 FINDINGS: The lungs are adequately inflated. The interstitial markings are more conspicuous bilaterally. The cardiac silhouette is enlarged and also more conspicuous. The pulmonary vascularity is more engorged. There is no pleural effusion. The Port-A-Cath appliance tip projects over the midportion of the SVC. IMPRESSION: Findings consistent with mild interstitial edema secondary to CHF. Underlying chronic bronchitic changes. No alveolar pneumonia. Electronically Signed   By: David  Martinique M.D.   On: 08/17/2016 16:40   EKG: Independently reviewed. Showed Atrial Fibrillation with no evidence of ST Elevation or depression.  Assessment/Plan Principal Problem:   Acute respiratory failure with hypoxia (HCC) Active Problems:   Obstructive sleep apnea    Permanent atrial fibrillation (HCC)   DIASTOLIC HEART FAILURE, CHRONIC   Current use of long term anticoagulation   Anemia, iron deficiency   Hypotension   SIRS (systemic inflammatory response syndrome) (HCC)   Influenza A  1. Acute Respiratory Failure with Hypoxia 2/2 to Influenza A -Admit to Inpatient -Check Respiratory Viral Panel; -Influenza PCR Positive for Influenza A; start Tamiflu -Recently Discharged from Hospital so has High Risk of HCAP -Empiric Abx started with Vancomycin and Cefepime; Low threshold to de-escalate -Wean O2 as Tolerated; Titrate to Maintain O2 Sats >92% -Repeat CXR in AM -Supportive Care  2. Hypotension, improving (has Hx of HTN) - In the setting of Dehydration, poor po intake and Diuretic use -Improving with IVF (1 Liter given in Er) and 500 mL given at Cardiology office -C/w IVF at 75 mL/hr -Recently Hospitalized with Norovirus previous admission -Hold Home Antihypertensives except Metoprolol  3. SIRS likely from URI/Influenza r/o HCAP -Sepsis Protocol intiated in Ed; Lactate was 0.71; Afebrile (99.2) no WBC, however had Fever of 102 last night -C/w Empirc IV Abx with Vancomycin and Cepfepime and de-escalate accordingly -Influenza PCR Positive for Influenza A; start Tamiflu ; Check Viral Respiratory Panel -Patient admits to coughing up clear/white sputum -Follow Cultures (blood, Urine, Sputum) -Mucinex -Breathing Tx with Xopenex -CXR was read as having findings consistent with mild interstitial edema secondary to CHF. Underlying chronic bronchitic changes. No alveolar pneumonia. - Repeat CXR in AM  4. Chronic Persistent Atrial Fibrillation with RVR -CHADS2VASC = 2 -Hold Coumadin until INR is back as INR was supratherpeutic 5 days ago at 6.1 -C/w Metoprolol  5. Chronic HFpEF -Currently not decompensated -Strict I's and O's; Daily Weights -Check BNP  6. Stage III Cancer of Right Colon s/p Right Hemicolectomy -On Adjuvant Chemoptherapy with  FOLFOX (last chemo 07/28/16): Last Chemo was postponed 08/25/16 -Follows with Dr. Burr Medico in Oncology  7. OSA -C/w Nocturnal CPAP  8. Iron Deficiency Anemia -C/w Ferrous Sulfate -Repeat CBC in AM  DVT prophylaxis: Hold Coumadin until INR results; SCD's Code Status: FULL CODE Family Communication: Discussed with wife at bedside Disposition Plan: Home when stable for D/C Consults called: None Admission status: Inpatient Telemetry  [facility], D.O. [facility] Pager [phone]  If 7PM-7AM, please contact night-coverage [email or web address] Password [facility]  08/17/2016, 6:40 PM

## 2016-08-17 NOTE — Progress Notes (Addendum)
IV started in office at [place] instruction. Obtained access with 1st attempt. Flushed w 10cc 0.9% NS. 20g catheter patent.  Has 565ml bolus running. Pt awake, alert, oriented, able to answer questions and communicate his consent to this procedure. Wife present at bedside.  Hand off complete with EMS, they transported patient via ambulance to ED.

## 2016-08-17 NOTE — Addendum Note (Signed)
Addended by: Theodore Demark on: 08/17/2016 03:24 PM   Modules accepted: Orders

## 2016-08-17 NOTE — ED Provider Notes (Signed)
[facility] DEPT Provider Note   CSN: [id number] Arrival date & time: 08/17/16  1518     History   Chief Complaint No chief complaint on file.   HPI Steven Barnett is a 66 y.o. male.  HPI  66 y.o. male with a hx of Stage I Colon Cancer, HTN, OSA, chronic diastolic HF with moderate LVH and post op atrial fibrillation on Coumadin, presents to the Emergency Department today from Cardiologist office due to fever and hypotension in office. Per Chart review: Colorectal cancer by Dr. [name], he underwent laparoscopic partial colectomy on 04/12/2016. Afterward, he received him on radiation therapy. He most recently admitted to the hospital on 08/06/2016 with somnolence and hypotension. He was admitted by pulmonary critical care service and treated for septic shock requiring pressor secondary to acute gastroenteritis with history of cecal cancer. Influenza test negative, but he was positive for Norovirus. He had acute kidney injury (initial Cr 3.37), hypokalemia (K2.5) and lactic acidosis (3.77). Went to cardiologist for post hospitalization follow up and found to be febrile, hypotensive. Worrisome for infection with likely respiratory source. Send to ED for eval. Given 250 NS bolus in office with mild improvement of BP. Pt has been restarted on Coumadin due to Afib. Weak in office with decrease PO intake as of late. Noted fever last night with TMax 102F. He did take his blood pressure medication this morning including hydralazine 50 mg, lisinopril 20 mg, and metoprolol 25 mg around 11 AM this morning. No pain currently. No N/V/D. No CP/SOB/ABD pain. Noted cough that is dry. No other symptoms noted.    Past Medical History:  Diagnosis Date  . Anemia   . Arthralgia of both knees   . Arthritis    "right knee" (10/01/2015)  . Atrial fibrillation ([place])   . Cancer (HCC)    STAGE 1 COLON CANCER  . Chronic anticoagulation 2010   Coumadin  . Chronic diastolic CHF (congestive heart failure), NYHA class 2  ([place])   . Hypertension   . OSA (obstructive sleep apnea) 2016   "couldn't take the mask during the testing" (10/01/2015)  . Permanent atrial fibrillation ([place]) 2010  . Pneumonia 3/29/2017and june 2017    Patient Active Problem List   Diagnosis Date Noted  . Septic shock ([place]) 08/10/2016  . Neutropenia, drug-induced ([place])   . Gastroenteritis due to norovirus   . AKI (acute kidney injury) ([place])   . Genetic testing 07/01/2016  . Port catheter in place 06/29/2016  . Family history of colon cancer 06/08/2016  . Family history of colonic polyps 06/08/2016  . Cancer of right colon ([place]) 04/12/2016  . Preoperative cardiovascular examination 04/06/2016  . CAP (community acquired pneumonia) 02/02/2016  . Acute on chronic diastolic heart failure ([place]) 02/02/2016  . Acute respiratory failure with hypoxia ([place]) 02/02/2016  . Anemia, iron deficiency 02/02/2016  . Current use of long term anticoagulation 12/03/2015  . Pneumonia 09/30/2015  . PNA (pneumonia) 09/30/2015  . Chronic anticoagulation - Coumadin, CHADS2VASC=2 08/05/2015  . Acute on chronic diastolic CHF (congestive heart failure), NYHA class 4 ([place]) 08/05/2015  . Acute on chronic congestive heart failure ([place])   . CHF exacerbation ([place]) 07/08/2015  . Community acquired pneumonia 02/10/2015  . ED (erectile dysfunction) 08/12/2014  . Arthralgia of both knees 04/08/2014  . Acute on chronic diastolic congestive heart failure ([place]) 01/14/2009  . Morbid obesity ([place]) 12/31/2008  . Obstructive sleep apnea 12/31/2008  . Essential hypertension, benign 12/31/2008  . Permanent atrial fibrillation ([place]) 12/31/2008  .  DIASTOLIC HEART FAILURE, CHRONIC 12/31/2008    Past Surgical History:  Procedure Laterality Date  . COLONOSCOPY WITH PROPOFOL N/A 03/04/2016   Procedure: COLONOSCOPY WITH PROPOFOL;  Surgeon: Carol Ada, MD;  Location: [facility];  Service: Endoscopy;  Laterality: N/A;  . LAPAROSCOPIC PARTIAL COLECTOMY N/A 04/12/2016    Procedure: LAPAROSCOPIC ILEOCOLECTOMY;  Surgeon: [name], MD;  Location: [facility];  Service: General;  Laterality: N/A;  . PORTACATH PLACEMENT N/A 05/18/2016   Procedure: INSERTION PORT-A-CATH;  Surgeon: [name], MD;  Location: [facility];  Service: General;  Laterality: N/A;  . THORACOTOMY Right 2010       Home Medications    Prior to Admission medications   Medication Sig Start Date End Date Taking? Authorizing Provider  acetaminophen (TYLENOL) 500 MG tablet Take 1,000 mg by mouth 2 (two) times daily.    Historical Provider, MD  albuterol (PROVENTIL HFA;VENTOLIN HFA) 108 (90 Base) MCG/ACT inhaler Inhale 2 puffs into the lungs every 6 (six) hours as needed for wheezing or shortness of breath. 10/01/15   Nita Sells, MD  aspirin EC 81 MG tablet Take 81 mg by mouth daily.    Historical Provider, MD  docusate sodium (COLACE) 100 MG capsule Take 1 capsule (100 mg total) by mouth 2 (two) times daily. For constipation 07/12/15   Ripudeep Krystal Eaton, MD  ferrous sulfate 325 (65 FE) MG tablet Take 1 tablet (325 mg total) by mouth 2 (two) times daily with a meal. 02/06/16   Hosie Poisson, MD  furosemide (LASIX) 80 MG tablet Take 1 tablet (80 mg total) by mouth daily. 08/11/16   Debbe Odea, MD  hydrALAZINE (APRESOLINE) 50 MG tablet TAKE ONE TABLET BY MOUTH TWICE DAILY 03/29/16   Pixie Casino, MD  ipratropium (ATROVENT HFA) 17 MCG/ACT inhaler Inhale 2 puffs into the lungs every 6 (six) hours as needed for wheezing.     Historical Provider, MD  lidocaine-prilocaine (EMLA) cream Apply to affected area once Patient taking differently: Apply 1 application topically once. Apply to affected area once 05/11/16   [name], MD  lisinopril (PRINIVIL,ZESTRIL) 20 MG tablet TAKE ONE TABLET BY MOUTH ONCE DAILY 04/29/16   Pixie Casino, MD  LORazepam (ATIVAN) 0.5 MG tablet Take 1 tablet (0.5 mg total) by mouth every 6 (six) hours as needed (Nausea or vomiting). 05/11/16   [name], MD    metoprolol tartrate (LOPRESSOR) 25 MG tablet TAKE ONE TABLET BY MOUTH TWICE DAILY 07/05/16   Pixie Casino, MD  ondansetron (ZOFRAN) 8 MG tablet Take 1 tablet (8 mg total) by mouth 2 (two) times daily as needed for refractory nausea / vomiting. Start on day 3 after chemotherapy. 05/11/16   [name], MD  potassium chloride SA (K-DUR,KLOR-CON) 20 MEQ tablet Take 2 tablets (40 mEq total) by mouth daily. 12/16/15   Pixie Casino, MD  prochlorperazine (COMPAZINE) 10 MG tablet Take 1 tablet (10 mg total) by mouth every 6 (six) hours as needed (Nausea or vomiting). Patient taking differently: Take 10 mg by mouth every 6 (six) hours as needed for nausea or vomiting.  05/11/16   [name], MD  sildenafil (REVATIO) 20 MG tablet TAKE TWO TO THREE TABLETS BY MOUTH ONCE DAILY AS NEEDED FOR SEXUAL ACTIVITY 06/06/16   Pixie Casino, MD  warfarin (COUMADIN) 4 MG tablet Take 4 mg by mouth daily. As directed    Historical Provider, MD    Family History Family History  Problem Relation Age of Onset  .  Heart disease Father   . Heart failure Father   . Colon polyps Father     unspecified number - pt of intestine surgically resected  . Hypertension Mother   . Diabetes Mother   . Hyperlipidemia Mother   . Colon polyps Mother     unspecified number - pt of intestine surgically resected  . Dementia Mother     d. 23  . Stroke Maternal Grandmother   . Stroke Sister   . Colon cancer Sister 25    w/ "cancerous polyps" - unspecified number; underwent surgery and radiation  . Colon polyps Brother     unspecified number - polypectomies  . Colon cancer Sister     dx 42-60; s/p surgery and radiation  . Bone cancer Maternal Uncle     dx. 50s  . Lung cancer Paternal Grandfather     d. 80s-90s; lung cancer vs TB  . Colon cancer Sister     dx. 61-60; s/p surgery and radiation  . Cancer Maternal Uncle     mother had about 7-8 other siblings, most passed at older ages, many of whom had some type of cancer  . Heart  attack Neg Hx     Social History Social History  Substance Use Topics  . Smoking status: Former Smoker    Packs/day: 0.10    Years: 2.00    Types: Cigarettes    Quit date: 12/16/1974  . Smokeless tobacco: Never Used     Comment: "1 pack cigarettes would last me a month"  . Alcohol use 0.0 oz/week     Comment: 10/01/2015 "might have1 6 pack of beer/year"   Allergies   No known allergies   Review of Systems Review of Systems ROS reviewed and all are negative for acute change except as noted in the HPI.  Physical Exam Updated Vital Signs BP 96/63   Pulse 87   Resp 18   Ht '5\' 10"'$  (1.778 m)   Wt 107.8 kg   SpO2 100%   BMI 34.11 kg/m   Physical Exam  Constitutional: He is oriented to person, place, and time. Vital signs are normal. He appears well-developed and well-nourished. No distress.  HENT:  Head: Normocephalic and atraumatic.  Right Ear: Hearing, tympanic membrane, external ear and ear canal normal.  Left Ear: Hearing, tympanic membrane, external ear and ear canal normal.  Nose: Nose normal.  Mouth/Throat: Uvula is midline, oropharynx is clear and moist and mucous membranes are normal. No trismus in the jaw. No oropharyngeal exudate, posterior oropharyngeal erythema or tonsillar abscesses.  Eyes: Conjunctivae and EOM are normal. Pupils are equal, round, and reactive to light.  Neck: Normal range of motion. Neck supple. No tracheal deviation present.  Cardiovascular: Regular rhythm, S1 normal, S2 normal, normal heart sounds, intact distal pulses and normal pulses.  Tachycardia present.   Pulmonary/Chest: Effort normal. No respiratory distress. He has no decreased breath sounds. He has wheezes in the right upper field, the right lower field, the left upper field and the left lower field. He has no rhonchi. He has no rales.  6L [place] O2  Abdominal: Normal appearance and bowel sounds are normal. There is no tenderness.  Musculoskeletal: Normal range of motion.  Neurological:  He is alert and oriented to person, place, and time.  Skin: Skin is warm and dry.  Psychiatric: He has a normal mood and affect. His speech is normal and behavior is normal. Thought content normal.  Nursing note and vitals reviewed.  ED Treatments /  Results  Labs (all labs ordered are listed, but only abnormal results are displayed) Labs Reviewed  COMPREHENSIVE METABOLIC PANEL - Abnormal; Notable for the following:       Result Value   Sodium 132 (*)    Chloride 98 (*)    Calcium 6.9 (*)    Total Protein 6.3 (*)    Albumin 2.5 (*)    AST 55 (*)    All other components within normal limits  CBC WITH DIFFERENTIAL/PLATELET - Abnormal; Notable for the following:    RBC 3.86 (*)    Hemoglobin 10.1 (*)    HCT 30.5 (*)    RDW 21.6 (*)    Lymphs Abs 0.5 (*)    All other components within normal limits  CULTURE, BLOOD (ROUTINE X 2)  CULTURE, BLOOD (ROUTINE X 2)  RESPIRATORY PANEL BY PCR  URINALYSIS, ROUTINE W REFLEX MICROSCOPIC  BRAIN NATRIURETIC PEPTIDE  INFLUENZA PANEL BY PCR (TYPE A & B)  I-STAT CG4 LACTIC ACID, ED  I-STAT TROPOININ, ED    EKG  EKG Interpretation  Date/Time:  Wednesday August 17 2016 15:38:59 EST Ventricular Rate:  101 PR Interval:    QRS Duration: 92 QT Interval:  371 QTC Calculation: 481 R Axis:   4 Text Interpretation:  Atrial fibrillation Borderline prolonged QT interval Atrial flutter pattern No STEMI Confirmed by Sherry Ruffing MD, CHRISTOPHER [id number]) on 08/17/2016 4:52:15 PM       Radiology Dg Chest Port 1 View  Result Date: 08/17/2016 CLINICAL DATA:  Follow-up from recent hospitalization for sepsis. The patient is hypotensive and 10 sitting complains of fatigue and fever. History of CHF, morbid obesity colonic malignancy, former smoker. EXAM: PORTABLE CHEST 1 VIEW COMPARISON:  Chest x-ray of August 06, 2016 FINDINGS: The lungs are adequately inflated. The interstitial markings are more conspicuous bilaterally. The cardiac silhouette is enlarged and  also more conspicuous. The pulmonary vascularity is more engorged. There is no pleural effusion. The Port-A-Cath appliance tip projects over the midportion of the SVC. IMPRESSION: Findings consistent with mild interstitial edema secondary to CHF. Underlying chronic bronchitic changes. No alveolar pneumonia. Electronically Signed   By: David  Martinique M.D.   On: 08/17/2016 16:40    Procedures Procedures (including critical care time)  Medications Ordered in ED Medications  vancomycin (VANCOCIN) 2,000 mg in sodium chloride 0.9 % 500 mL IVPB (2,000 mg Intravenous New Bag/Given 08/17/16 1724)  sodium chloride 0.9 % bolus 1,000 mL (0 mLs Intravenous Stopped 08/17/16 1723)  levalbuterol (XOPENEX) nebulizer solution 0.63 mg (0.63 mg Nebulization Given by Other 08/17/16 1606)  ceFEPIme (MAXIPIME) 2 g in dextrose 5 % 50 mL IVPB (0 g Intravenous Stopped 08/17/16 1724)   Initial Impression / Assessment and Plan / ED Course  I have reviewed the triage vital signs and the nursing notes.  Pertinent labs & imaging results that were available during my care of the patient were reviewed by me and considered in my medical decision making (see chart for details).  Final Clinical Impressions(s) / ED Diagnoses  {I have reviewed and evaluated the relevant laboratory values. {I have reviewed and evaluated the relevant imaging studies. {I have interpreted the relevant EKG. {I have reviewed the relevant previous healthcare records. {I have reviewed EMS Documentation. {I obtained HPI from historian. {Patient discussed with supervising physician.  ED Course:  Assessment: 65yM with a hx of Stage I Colon Cancer, HTN, OSA, chronic diastolic HF with moderate LVH and post op atrial fibrillation on Coumadin, presents to the Emergency Department today from  Cardiologist office due to fever and hypotension in office. Per Chart review: Colorectal cancer by Dr. [name], he underwent laparoscopic partial colectomy on 04/12/2016. Afterward,  he received him on radiation therapy. He most recently admitted to the hospital on 08/06/2016 with somnolence and hypotension. He was admitted by pulmonary critical care service and treated for septic shock requiring pressor secondary to acute gastroenteritis with history of cecal cancer. Influenza test negative, but he was positive for Norovirus. He had acute kidney injury (initial Cr 3.37), hypokalemia (K2.5) and lactic acidosis (3.77). Went to cardiologist for post hospitalization follow up and found to be febrile, hypotensive.  On exam, pt septic appearing. VS with BP low 15N systolic, HR 539 irregularly irregular. Temp 99.2. O2 requirement with O2 sat 96% on 6L [place]. Lungs with bilateral wheeze. Abdomen nontender soft. Code Sepsis initiated as pt met SIRS criteria with suspected respiratory source. Possible HAP. Made NPO. Given Vanc/Cefepime abx to cover HAP. 1L NS Bolus given. iStat Lactate 0.71. WBC 5.6.  BNP pending. CXR with interstitial edema. Trop negative. Plan is to [place]. Repeat sepsis assessment completed.  Hypotensive The patient is noted to have a MAP's <65/ SBP's <90. With the current information available to me, I don't think the patient is in septic shock. The MAP's <65/ SBP's <90, is related to an acute condition that is not due to an infection. Likely dehydration related.  Disposition/Plan:  Admit Pt acknowledges and agrees with plan  Supervising Physician Varney Biles, MD  Final diagnoses:  Hypotension, unspecified hypotension type    New Prescriptions New Prescriptions   No medications on file     Shary Decamp, PA-C 08/17/16 [place], MD 08/17/16 1904

## 2016-08-17 NOTE — Progress Notes (Signed)
Pharmacy Antibiotic Note  Steven Barnett is a 66 y.o. male admitted on 08/17/2016 with HCAP.  Pharmacy has been consulted for vancomycin/cefepime dosing.  Pt was seen in cardiologist office and appeared ill, sent to ED. Pt had recent admission from 2/3 to 2/8 due to gastroenteritis due to Norovirus and chemo induced neuropenia. Pt required pressors for septic shock during that admission.  Pt with PMH of colon cancer for which pt is receiving FOLFOX, last dose on 1/25.    Plan:  Cefepime 2 gr IV x1, then cefepime 1 gr IV q8h   Vancomycin 2000 mg IV x1, then vancomycin 750 mg IV q12h  Monitor for clinical course, cultures, renal function as available  Height: 5\' 10"  (177.8 cm) Weight: 237 lb 12 oz (107.8 kg) IBW/kg (Calculated) : 73  Temp (24hrs), Avg:99.2 F (37.3 C), Min:99.2 F (37.3 C), Max:99.2 F (37.3 C)   Recent Labs Lab 08/11/16 0458 08/12/16 1431 08/17/16 1214 08/17/16 1623 08/17/16 1633  WBC  --   --  5.5 5.6  --   CREATININE 1.06 0.96 1.0 1.12  --   LATICACIDVEN  --   --   --   --  0.71    Estimated Creatinine Clearance: 81.9 mL/min (by C-G formula based on SCr of 1.12 mg/dL).    Allergies  Allergen Reactions  . No Known Allergies     Antimicrobials this admission: 2/14 vancomycin >>  2/14 cefepime >>   Dose adjustments this admission: ---  Microbiology results: 2/14 BCx: sent 2/14 influenza panel: sent  2/14 Sputum: sent  2/14 HIV antibody: sent 2/14: strep pneumo antigen :   Thank you for allowing pharmacy to be a part of this patient's care.   Royetta Asal, PharmD, BCPS Pager [phone] 08/17/2016 6:24 PM

## 2016-08-18 ENCOUNTER — Encounter (HOSPITAL_COMMUNITY): Payer: Self-pay

## 2016-08-18 DIAGNOSIS — J9601 Acute respiratory failure with hypoxia: Secondary | ICD-10-CM

## 2016-08-18 LAB — BLOOD CULTURE ID PANEL (REFLEXED)
ACINETOBACTER BAUMANNII: NOT DETECTED
CANDIDA ALBICANS: NOT DETECTED
CANDIDA GLABRATA: NOT DETECTED
CANDIDA TROPICALIS: NOT DETECTED
Candida krusei: NOT DETECTED
Candida parapsilosis: NOT DETECTED
ENTEROBACTERIACEAE SPECIES: NOT DETECTED
Enterobacter cloacae complex: NOT DETECTED
Enterococcus species: NOT DETECTED
Escherichia coli: NOT DETECTED
HAEMOPHILUS INFLUENZAE: NOT DETECTED
KLEBSIELLA OXYTOCA: NOT DETECTED
KLEBSIELLA PNEUMONIAE: NOT DETECTED
Listeria monocytogenes: NOT DETECTED
NEISSERIA MENINGITIDIS: NOT DETECTED
PROTEUS SPECIES: NOT DETECTED
Pseudomonas aeruginosa: NOT DETECTED
STAPHYLOCOCCUS SPECIES: NOT DETECTED
STREPTOCOCCUS SPECIES: DETECTED — AB
Serratia marcescens: NOT DETECTED
Staphylococcus aureus (BCID): NOT DETECTED
Streptococcus agalactiae: NOT DETECTED
Streptococcus pneumoniae: NOT DETECTED
Streptococcus pyogenes: NOT DETECTED

## 2016-08-18 LAB — RESPIRATORY PANEL BY PCR
ADENOVIRUS-RVPPCR: NOT DETECTED
Bordetella pertussis: NOT DETECTED
CORONAVIRUS HKU1-RVPPCR: NOT DETECTED
CORONAVIRUS NL63-RVPPCR: NOT DETECTED
Chlamydophila pneumoniae: NOT DETECTED
Coronavirus 229E: NOT DETECTED
Coronavirus OC43: NOT DETECTED
Influenza A H3: DETECTED — AB
Influenza B: NOT DETECTED
METAPNEUMOVIRUS-RVPPCR: DETECTED — AB
Mycoplasma pneumoniae: NOT DETECTED
PARAINFLUENZA VIRUS 2-RVPPCR: NOT DETECTED
PARAINFLUENZA VIRUS 3-RVPPCR: NOT DETECTED
Parainfluenza Virus 1: NOT DETECTED
Parainfluenza Virus 4: NOT DETECTED
RHINOVIRUS / ENTEROVIRUS - RVPPCR: NOT DETECTED
Respiratory Syncytial Virus: NOT DETECTED

## 2016-08-18 LAB — COMPREHENSIVE METABOLIC PANEL
ALBUMIN: 2.3 g/dL — AB (ref 3.5–5.0)
ALT: 26 U/L (ref 17–63)
AST: 77 U/L — AB (ref 15–41)
Alkaline Phosphatase: 118 U/L (ref 38–126)
Anion gap: 5 (ref 5–15)
BUN: 12 mg/dL (ref 6–20)
CO2: 26 mmol/L (ref 22–32)
Calcium: 7.2 mg/dL — ABNORMAL LOW (ref 8.9–10.3)
Chloride: 103 mmol/L (ref 101–111)
Creatinine, Ser: 1.01 mg/dL (ref 0.61–1.24)
GFR calc Af Amer: >60 mL/min (ref >60)
GFR calc non Af Amer: >60 mL/min (ref >60)
GLUCOSE: 81 mg/dL (ref 65–99)
Potassium: 4.3 mmol/L (ref 3.5–5.1)
SODIUM: 134 mmol/L — AB (ref 135–145)
Total Bilirubin: 0.8 mg/dL (ref 0.3–1.2)
Total Protein: 5.8 g/dL — ABNORMAL LOW (ref 6.5–8.1)

## 2016-08-18 LAB — STREP PNEUMONIAE URINARY ANTIGEN: Strep Pneumo Urinary Antigen: NEGATIVE

## 2016-08-18 LAB — CBC
HEMATOCRIT: 29.5 % — AB (ref 39.0–52.0)
Hemoglobin: 9.7 g/dL — ABNORMAL LOW (ref 13.0–17.0)
MCH: 26.1 pg (ref 26.0–34.0)
MCHC: 32.9 g/dL (ref 30.0–36.0)
MCV: 79.5 fL (ref 78.0–100.0)
PLATELETS: 137 10*3/uL — AB (ref 150–400)
RBC: 3.71 MIL/uL — ABNORMAL LOW (ref 4.22–5.81)
RDW: 21.7 % — AB (ref 11.5–15.5)
WBC: 3.9 10*3/uL — AB (ref 4.0–10.5)

## 2016-08-18 LAB — PROTIME-INR
INR: 2.58
Prothrombin Time: 28.2 seconds — ABNORMAL HIGH (ref 11.4–15.2)

## 2016-08-18 MED ORDER — IBUPROFEN 200 MG PO TABS
600.0000 mg | ORAL_TABLET | Freq: Once | ORAL | Status: DC
  Administered 2016-08-18: 600 mg via ORAL
  Filled 2016-08-18: qty 3

## 2016-08-18 MED ORDER — WARFARIN SODIUM 5 MG PO TABS
5.0000 mg | ORAL_TABLET | Freq: Once | ORAL | Status: AC
  Administered 2016-08-18: 5 mg via ORAL
  Filled 2016-08-18: qty 1

## 2016-08-18 MED ORDER — ACETAMINOPHEN 325 MG PO TABS: 325.0000 mg | ORAL_TABLET | Freq: Once | ORAL | Status: DC

## 2016-08-18 MED ORDER — ENSURE ENLIVE PO LIQD
237.0000 mL | Freq: Two times a day (BID) | ORAL | Status: DC
  Administered 2016-08-18 – 2016-08-19 (×2): 237 mL via ORAL

## 2016-08-18 NOTE — Progress Notes (Signed)
OT Cancellation Note  Patient Details Name: MAHAMADOU WELTZ MRN: [id number] DOB: [birth year]   Cancelled Treatment:     Pt fatiqued; worked with PT earlier. Will reattempt tomorrow.  Deondria Puryear 08/18/2016, 12:06 PM   Lesle Chris, OTR/L [phone] 08/18/2016

## 2016-08-18 NOTE — Progress Notes (Signed)
[place] for  warfarin Indication: atrial fibrillation  Allergies  Allergen Reactions  . No Known Allergies     Patient Measurements: Height: 5\' 10"  (177.8 cm) Weight: 238 lb 11.2 oz (108.3 kg) IBW/kg (Calculated) : 73   Vital Signs: Temp: 101 F (38.3 C) (02/15 0600) Temp Source: Oral (02/15 0600) BP: 118/75 (02/15 0600) Pulse Rate: 92 (02/15 0717)  Labs:  Recent Labs  08/17/16 1214 08/17/16 1623 08/18/16 0502  HGB 10.9* 10.1* 9.7*  HCT 33.4* 30.5* 29.5*  PLT 182 156 137*  LABPROT  --  29.8* 28.2*  INR  --  2.76 2.58  CREATININE 1.0 1.12 1.01    Estimated Creatinine Clearance: 91 mL/min (by C-G formula based on SCr of 1.01 mg/dL).  Medications:  Scheduled:  . aspirin EC  81 mg Oral Daily  . ceFEPime (MAXIPIME) IV  1 g Intravenous Q8H  . ferrous sulfate  325 mg Oral BID WC  . guaiFENesin  600 mg Oral BID  . levalbuterol  0.63 mg Nebulization TID  . mouth rinse  15 mL Mouth Rinse BID  . metoprolol tartrate  25 mg Oral BID  . oseltamivir  75 mg Oral BID  . sodium chloride flush  3 mL Intravenous Q12H  . vancomycin  750 mg Intravenous Q12H  . Warfarin - Pharmacist Dosing Inpatient   Does not apply q1800    Assessment: Pharmacy is consulted to dose warfarin in 66 yo male diagnosed with afib. Last dose 2/13. INR supratherapeutic at recent INR checks, 6.1 on 2/9  Pt was on 8mg  daily but now reducing dose after that INR level. Was told to hold warfarin fri, sat, sun and take 4mg  Mon, 8mg  Tue and Wed, 4mg  Thur and get INR checked 2/16.  Today, 08/18/16   INR therapeutic and slightly lower after reduced dose yesterday  Hgb sl lower, plt low but > 100k  Positive for Influenza A  Goal of Therapy:  INR 2-3 Monitor platelets by anticoagulation protocol: Yes   Plan:   Warfarin 5 mg PO x1; anticipate rise in INR with new acute illness  Daily INR  CBC at least q72 hr  Monitor for signs and symptoms of  bleeding   Reuel Boom, PharmD, BCPS Pager: [phone] 08/18/2016, 7:47 AM

## 2016-08-18 NOTE — Progress Notes (Signed)
PHARMACY - PHYSICIAN COMMUNICATION CRITICAL VALUE ALERT - BLOOD CULTURE IDENTIFICATION (BCID)  Results for orders placed or performed during the hospital encounter of 08/17/16  Blood Culture ID Panel (Reflexed) (Collected: 08/17/2016  4:23 PM)  Result Value Ref Range   Enterococcus species NOT DETECTED NOT DETECTED   Listeria monocytogenes NOT DETECTED NOT DETECTED   Staphylococcus species NOT DETECTED NOT DETECTED   Staphylococcus aureus NOT DETECTED NOT DETECTED   Streptococcus species DETECTED (A) NOT DETECTED   Streptococcus agalactiae NOT DETECTED NOT DETECTED   Streptococcus pneumoniae NOT DETECTED NOT DETECTED   Streptococcus pyogenes NOT DETECTED NOT DETECTED   Acinetobacter baumannii NOT DETECTED NOT DETECTED   Enterobacteriaceae species NOT DETECTED NOT DETECTED   Enterobacter cloacae complex NOT DETECTED NOT DETECTED   Escherichia coli NOT DETECTED NOT DETECTED   Klebsiella oxytoca NOT DETECTED NOT DETECTED   Klebsiella pneumoniae NOT DETECTED NOT DETECTED   Proteus species NOT DETECTED NOT DETECTED   Serratia marcescens NOT DETECTED NOT DETECTED   Haemophilus influenzae NOT DETECTED NOT DETECTED   Neisseria meningitidis NOT DETECTED NOT DETECTED   Pseudomonas aeruginosa NOT DETECTED NOT DETECTED   Candida albicans NOT DETECTED NOT DETECTED   Candida glabrata NOT DETECTED NOT DETECTED   Candida krusei NOT DETECTED NOT DETECTED   Candida parapsilosis NOT DETECTED NOT DETECTED   Candida tropicalis NOT DETECTED NOT DETECTED    Name of physician (or Provider) Contacted: [place]  Changes to prescribed antibiotics required: none, recently discontinued vanc/cefepime. Possibly a contaminant; will hold off on resuming abx for now pending course with known +Influenza A  Steven Barnett A 08/18/2016  10:23 AM

## 2016-08-18 NOTE — Progress Notes (Signed)
Pt. HR in 140's. Upon assessment of pt., he was sitting up on the side of the bed and stated he felt hot. VS obtained, Temp 102.7 orally, BP 134/96 and HR 116 with respirations of 20 and O2 at 97% at 1L [place]. 650 mg PO Tylenol given. On call MD paged bolus. Will continue to monitor pt and carry out any new orders.

## 2016-08-18 NOTE — Progress Notes (Signed)
Steven Barnett AVW:[id number] DOB: [birth year] DOA: 08/17/2016 PCP: Elisabeth Cara, PA-C  Brief narrative: 66 year old male Atrial fibrillation, [place] score >2, on Coumadin 2010 History right-sided thoracotomy for empyema 11/2008-diagnosis A. 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 Burr Medico  FOLFOX chemotherapy last 1/25.  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% per echo 07/09/15 HTN Prior smoker OSA intolerant to CPAP diagnosed 2016-on Revatio supposedly Metabolic encephalopathy   Recent admission 2/3-2/8 norovirus, nausea, vomiting--hospitalization complicated with hyperkalemia needed pressors last admission  On that admission metoprolol, aspirin 25-->12.5 -2 second pauses and Cardizem was discontinued completely  seen at cardiology office 08/17/2016--found to be hypotensive percent ED for evaluation Noted at home to have a fever for 102 Empirically started on broad-spectrum bank and cefepime H influenza positive   Labs stable ProBNP 161 Lactic acid 0.7 WBC 3.9 hemoglobin 9.7 platelet 137-Baseline only mild thrombocytopenia CXR admission = mild interstitial edema secondary to CHF  Past medical history-As per Problem list Chart reviewed as below- reviewed  Consultants:  None yet  Procedures:  none  Antibiotics:  Tamiflu 2/14   Subjective  Alert oriented feels somewhat poorly Had a large stool yesterday diarrhea-like and some bloating and abdominal pain No current nausea vomiting or chest pain States that he's had some bleeding from his penis and has had no recent instrumentation    Objective    Interim History:   Telemetry: Sinus rhythm   Objective: Vitals:   08/17/16 2001 08/17/16 2036 08/18/16 0600 08/18/16 0717  BP: 126/79  118/75   Pulse: 87  90 92  Resp: (!) 21  (!) 24 (!) 25  Temp: 99.1 F (37.3 C)  (!) 101 F (38.3 C)   TempSrc: Oral   Oral   SpO2: 100% 99% 98% 97%  Weight: 108.5 kg (239 lb 3.2 oz)  108.3 kg (238 lb 11.2 oz)   Height: 5\' 10"  (1.778 m)       Intake/Output Summary (Last 24 hours) at 08/18/16 0740 Last data filed at 08/17/16 2307  Gross per 24 hour  Intake             1050 ml  Output              150 ml  Net              900 ml    Exam:  General: Alert oriented in some distress but overall seems stable Cardiovascular:  S1-S2 no murmur rub or galop Respiratory: Clinically clear no added sound Abdomen: Soft nontender nondistended no rebound no guarding Skin no lower extremity edema Neuro intact. An  Data Reviewed: Basic Metabolic Panel:  Recent Labs Lab 08/12/16 1431 08/17/16 1214 08/17/16 1623 08/18/16 0502  NA 146 135* 132* 134*  K 4.2 4.4 3.9 4.3  CL 119*  --  98* 103  CO2 20 28 28 26   GLUCOSE 84 81 71 81  BUN 13 9.7 12 12   CREATININE 0.96 1.0 1.12 1.01  CALCIUM 7.6* 8.2* 6.9* 7.2*   Liver Function Tests:  Recent Labs Lab 08/17/16 1214 08/17/16 1623 08/18/16 0502  AST 47* 55* 77*  ALT 18 21 26   ALKPHOS 136 112 118  BILITOT 0.77 1.1 0.8  PROT 6.8 6.3* 5.8*  ALBUMIN 2.4* 2.5* 2.3*   No results for input(s): LIPASE, AMYLASE in the last 168 hours. No results for input(s): AMMONIA in the last 168 hours. CBC:  Recent  Labs Lab 08/17/16 1214 08/17/16 1623 08/18/16 0502  WBC 5.5 5.6 3.9*  NEUTROABS 4.6 4.6  --   HGB 10.9* 10.1* 9.7*  HCT 33.4* 30.5* 29.5*  MCV 80.1 79.0 79.5  PLT 182 156 137*   Cardiac Enzymes: No results for input(s): CKTOTAL, CKMB, CKMBINDEX, TROPONINI in the last 168 hours. BNP: Invalid input(s): POCBNP CBG: No results for input(s): GLUCAP in the last 168 hours.  No results found for this or any previous visit (from the past 240 hour(s)).   Studies:              All Imaging reviewed and is as per above notation   Scheduled Meds: . aspirin EC  81 mg Oral Daily  . ceFEPime (MAXIPIME) IV  1 g Intravenous Q8H  . ferrous sulfate  325 mg  Oral BID WC  . guaiFENesin  600 mg Oral BID  . levalbuterol  0.63 mg Nebulization TID  . mouth rinse  15 mL Mouth Rinse BID  . metoprolol tartrate  25 mg Oral BID  . oseltamivir  75 mg Oral BID  . sodium chloride flush  3 mL Intravenous Q12H  . vancomycin  750 mg Intravenous Q12H  . Warfarin - Pharmacist Dosing Inpatient   Does not apply q1800   Continuous Infusions: . sodium chloride 75 mL/hr at 08/17/16 2010     Assessment/Plan:  1. Acute Respiratory Failure with Hypoxia 2/2 to Influenza A -Admit to Inpatient -Influenza PCR Positive for Influenza A; start Tamiflu -Recently Discharged from Hospital so has High Risk of HCAP -Vancomycin and Cefepime de-escalated on 2/15 -Wean O2 as Tolerated; Titrate to Maintain O2 Sats >92% -Repeat CXR 08/19/16 -Supportive Care -Repeat differential in a.m. as he has mild pancytopenia and has been on Granix in the past  2. Hypotension, improving (has Hx of HTN) -In the setting of Dehydration, poor po intake and Diuretic use -Improving with IVF (1 Liter given in Er) and 500 mL given at Cardiology office -C/w IVF at 75 mL/hr -Recently Hospitalized with Norovirus previous admission -Hold Home Antihypertensives lisinopril 20 , Lasix 80, hydralazine 50 twice a day except Metoprolol 25 twice a day  -Blood pressure now 118-120/80's  3. SIRS likely from URI/Influenza r/o HCAP -Sepsis Protocol intiated in Ed; Lactate was 0.71; Afebrile (99.2) no WBC, however had Fever of 102 last night -Influenza PCR Positive for Influenza A; start Tamiflu -can follow for academic Viral Respiratory Panel -Patient admits to coughing up clear/white sputum -Follow Cultures (blood, Urine, Sputum) -Mucinex -Breathing Tx with Xopenex -CXR was read as having findings consistent with mild interstitial edema secondary to CHF. Underlying chronic bronchitic changes. No alveolar pneumonia.  4. Chronic Persistent Atrial Fibrillation with RVR -CHADS2VASC = 2 -Pharmacy to  dose Coumadin until INR is back as INR was supratherpeutic 5 days ago at 6.1 -C/w Metoprolol -We will keep him on telemetry overnight and discontinue the same 2/16 if no significant changes  5. Chronic HFpEF -Currently not decompensated -Strict I's and O's; Daily Weights -Check BNP  6. 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 Burr Medico in Oncology -He tells me that if he feels is poorly oncologist about this  7. OSA -C/w Nocturnal CPAP if tolerated -He has some pulmonary hypertension, continue Revatio 40 3 times a day if this is indication  8. Iron Deficiency Anemia -C/w Ferrous Sulfate -Repeat CBC in AM  No family present at  the bedside-called and d/w wife Have alerted his primary oncologist to his presence here as a courtesy through [facility] on tele Await resolution PT Eval pending   Verneita Griffes, MD  [facility] Pager [phone] 08/18/2016, 7:40 AM    LOS: 1 day

## 2016-08-18 NOTE — Progress Notes (Signed)
On call provider made aware of Temp 101.2 orally and HR in the 120s-130s despite Tylenol 650 mg. Will continue to monitor pt closely and carry out any new orders.

## 2016-08-18 NOTE — Progress Notes (Signed)
Initial Nutrition Assessment  DOCUMENTATION CODES:   Obesity unspecified  INTERVENTION:   Continue Ensure Enlive po BID, each supplement provides 350 kcal and 20 grams of protein Encourage PO intake RD to continue to monitor  NUTRITION DIAGNOSIS:   Increased nutrient needs related to cancer and cancer related treatments as evidenced by estimated needs.  GOAL:   Patient will meet greater than or equal to 90% of their needs  MONITOR:   PO intake, Supplement acceptance, Weight trends, Labs, I & O's  REASON FOR ASSESSMENT:   Malnutrition Screening Tool    ASSESSMENT:   66 y.o. male with medical history significant of HTN, Stage III Colon Cancer s/p Right Hemicolectomy on Chemotherapy with FOLFOX, Chronic Atrial Fibrillation, OSA on CPAP, Anemia, Chronic Diastolic Heart Failure who was just recently in the hospital with Norovirus and Sepsis. Patient was at his Cardiologist this AM when he was noted to be hypotensive so he was given a bolus of NS and sent to the ED for evaluation. Per pateint he has not been feeling well for the last few days with extremely poor appetitie and po intake and has had generalized weakness and fatigue. Wife noted that he had a fever of 102 last night. Has had some coughing but sputum is clear/whitish colored. Has not had a proper meal in 3 days and has been taking his meds on empty stomach. No Nausea/Vomiting or Diarrhea.  Patient familiar to this RD from previous admission 2/5. Pt with colon ca, s/p hemicolectomy and was receiving chemotherapy. Pt has continued to struggle with intake which decreased since last admission. Pt concerned with weight and would like to lose weight. Pt is drinking ensure supplements.    Per chart review, pt has lost 13 lb since 2/5 (5% wt loss x 10 days, significant for time frame). Nutrition focused physical exam shows no sign of depletion of muscle mass or body fat. Pt at risk for malnutrition given weight loss and now decreased  PO intake.  Medications: Ferrous sulfate tablet BID Labs reviewed: Low Na  Diet Order:  Diet Heart Room service appropriate? Yes; Fluid consistency: Thin  Skin:  Reviewed, no issues  Last BM:  2/14  Height:   Ht Readings from Last 1 Encounters:  08/17/16 5\' 10"  (1.778 m)    Weight:   Wt Readings from Last 1 Encounters:  08/18/16 238 lb 11.2 oz (108.3 kg)    Ideal Body Weight:  75.5 kg  BMI:  Body mass index is 34.25 kg/m.  Estimated Nutritional Needs:   Kcal:  4801-6553  Protein:  115-125g  Fluid:  2L/day  EDUCATION NEEDS:   No education needs identified at this time  Clayton Bibles, MS, RD, LDN Pager: [phone] After Hours Pager: [phone]

## 2016-08-18 NOTE — Evaluation (Signed)
Physical Therapy Evaluation Patient Details Name: ARMAS MCBEE MRN: [id number] DOB: Oct 15, 1950 Today's Date: 08/18/2016   History of Present Illness  66 yo male admitted with hypotension, + flu. Recent d/c 08/11/16. Hx of norovirus last admission, colon ca, colectomy 2017, anemia, A fib, CHF, HTN    Clinical Impression  On eval, pt required Min assist for mobility. He was only able to tolerate standing and a stand pivot on today. He declined sitting up in recliner. He was too weak to attempt ambulation on today. Pt presents with generalized weakness, decreased activity tolerance, and impaired gait and balance. Will follow during hospital stay and progress activity as tolerated.     Follow Up Recommendations SNF;Home health PT;Supervision/Assistance - 24 hour (depending on progress)    Equipment Recommendations   (continuing to assess)    Recommendations for Other Services OT consult     Precautions / Restrictions Precautions Precautions: Fall Precaution Comments: droplet +flu. Monitior O2 sats Restrictions Weight Bearing Restrictions: No      Mobility  Bed Mobility Overal bed mobility: Needs Assistance Bed Mobility: Supine to Sit;Sit to Supine     Supine to sit: Min assist;HOB elevated Sit to supine: Min assist;HOB elevated   General bed mobility comments: Assist for trunk to upright and LEs back onto bed. Increased time. Effortful for pt. Pt relied on bedrail  Transfers Overall transfer level: Needs assistance Equipment used: Rolling walker (2 wheeled) Transfers: Sit to/from Stand Sit to Stand: Min assist;From elevated surface Stand pivot transfers: Min assist       General transfer comment: Assist to rise, stabilize, control desent. Pt stood and pivoted in place while holding on to bedrail. He is unsteady and very weak. Remained on [place] O2 3L-sat 100%  Ambulation/Gait             General Gait Details: NT-too weak to attempt on today  Stairs             Wheelchair Mobility    Modified Rankin (Stroke Patients Only)       Balance                                             Pertinent Vitals/Pain Pain Assessment: No/denies pain    Home Living Family/patient expects to be discharged to:: Private residence Living Arrangements: Spouse/significant other Available Help at Discharge: Family Type of Home: Apartment Home Access: Stairs to enter Entrance Stairs-Rails: Right Entrance Stairs-Number of Steps: 1 flight Home Layout: One level Home Equipment: None      Prior Function Level of Independence: Independent               Hand Dominance        Extremity/Trunk Assessment   Upper Extremity Assessment Upper Extremity Assessment: Defer to OT evaluation    Lower Extremity Assessment Lower Extremity Assessment: Generalized weakness    Cervical / Trunk Assessment Cervical / Trunk Assessment: Normal  Communication   Communication: No difficulties  Cognition Arousal/Alertness: Awake/alert Behavior During Therapy: WFL for tasks assessed/performed Overall Cognitive Status: Within Functional Limits for tasks assessed                      General Comments      Exercises     Assessment/Plan    PT Assessment Patient needs continued PT services  PT Problem List Decreased strength;Decreased mobility;Decreased activity  tolerance;Decreased balance;Decreased knowledge of use of DME          PT Treatment Interventions DME instruction;Gait training;Therapeutic exercise;Therapeutic activities;Patient/family education;Balance training;Functional mobility training    PT Goals (Current goals can be found in the Care Plan section)  Acute Rehab PT Goals Patient Stated Goal: to feel better PT Goal Formulation: With patient Time For Goal Achievement: 08/25/16 Potential to Achieve Goals: Good    Frequency Min 3X/week   Barriers to discharge        Co-evaluation               End  of Session Equipment Utilized During Treatment: Gait belt Activity Tolerance: Patient limited by fatigue Patient left: in bed;with call bell/phone within reach;with bed alarm set           Time: 8185-9093 PT Time Calculation (min) (ACUTE ONLY): 12 min   Charges:         PT G Codes:        Weston Anna, MPT Pager: [phone]

## 2016-08-18 NOTE — Progress Notes (Signed)
CSW reviewed PT evaluation recommending SNF. CSW spoke with patient re: discharge planning - patient declined SNF placement, states that he plans to return home with his wife. RNCM Juliann Pulse made aware.   No further CSW needs identified - CSW signing off.   Raynaldo Opitz, [facility] Clinical Social Worker cell #: [phone]

## 2016-08-19 ENCOUNTER — Inpatient Hospital Stay (HOSPITAL_COMMUNITY): Payer: Medicaid Other

## 2016-08-19 LAB — CBC WITH DIFFERENTIAL/PLATELET
BASOS PCT: 1 %
Basophils Absolute: 0.1 10*3/uL (ref 0.0–0.1)
EOS PCT: 0 %
Eosinophils Absolute: 0 10*3/uL (ref 0.0–0.7)
HEMATOCRIT: 29.6 % — AB (ref 39.0–52.0)
Hemoglobin: 9.6 g/dL — ABNORMAL LOW (ref 13.0–17.0)
Lymphocytes Relative: 17 %
Lymphs Abs: 0.9 10*3/uL (ref 0.7–4.0)
MCH: 25.5 pg — AB (ref 26.0–34.0)
MCHC: 32.4 g/dL (ref 30.0–36.0)
MCV: 78.7 fL (ref 78.0–100.0)
MONO ABS: 0.4 10*3/uL (ref 0.1–1.0)
MONOS PCT: 8 %
NEUTROS PCT: 74 %
Neutro Abs: 3.9 10*3/uL (ref 1.7–7.7)
PLATELETS: 124 10*3/uL — AB (ref 150–400)
RBC: 3.76 MIL/uL — AB (ref 4.22–5.81)
RDW: 21.6 % — ABNORMAL HIGH (ref 11.5–15.5)
WBC: 5.3 10*3/uL (ref 4.0–10.5)

## 2016-08-19 LAB — COMPREHENSIVE METABOLIC PANEL
ALT: 27 U/L (ref 17–63)
AST: 80 U/L — AB (ref 15–41)
Albumin: 2.1 g/dL — ABNORMAL LOW (ref 3.5–5.0)
Alkaline Phosphatase: 119 U/L (ref 38–126)
Anion gap: 5 (ref 5–15)
BILIRUBIN TOTAL: 0.5 mg/dL (ref 0.3–1.2)
BUN: 10 mg/dL (ref 6–20)
CALCIUM: 7 mg/dL — AB (ref 8.9–10.3)
CHLORIDE: 103 mmol/L (ref 101–111)
CO2: 25 mmol/L (ref 22–32)
CREATININE: 0.96 mg/dL (ref 0.61–1.24)
Glucose, Bld: 85 mg/dL (ref 65–99)
Potassium: 3.8 mmol/L (ref 3.5–5.1)
Sodium: 133 mmol/L — ABNORMAL LOW (ref 135–145)
TOTAL PROTEIN: 5.7 g/dL — AB (ref 6.5–8.1)

## 2016-08-19 LAB — CULTURE, BLOOD (ROUTINE X 2)

## 2016-08-19 LAB — HIV ANTIBODY (ROUTINE TESTING W REFLEX): HIV SCREEN 4TH GENERATION: NONREACTIVE

## 2016-08-19 LAB — PROTIME-INR
INR: 2.32
Prothrombin Time: 25.9 seconds — ABNORMAL HIGH (ref 11.4–15.2)

## 2016-08-19 IMAGING — DX DG CHEST 2V
2 series · 2 of 2 positions shown · non-contrast
Comparison: [DATE]

CLINICAL DATA: Shortness of breath, weakness, influenza, fever,
hypotension and rectal carcinoma.

EXAM:
CHEST  2 VIEW

[chest lat]
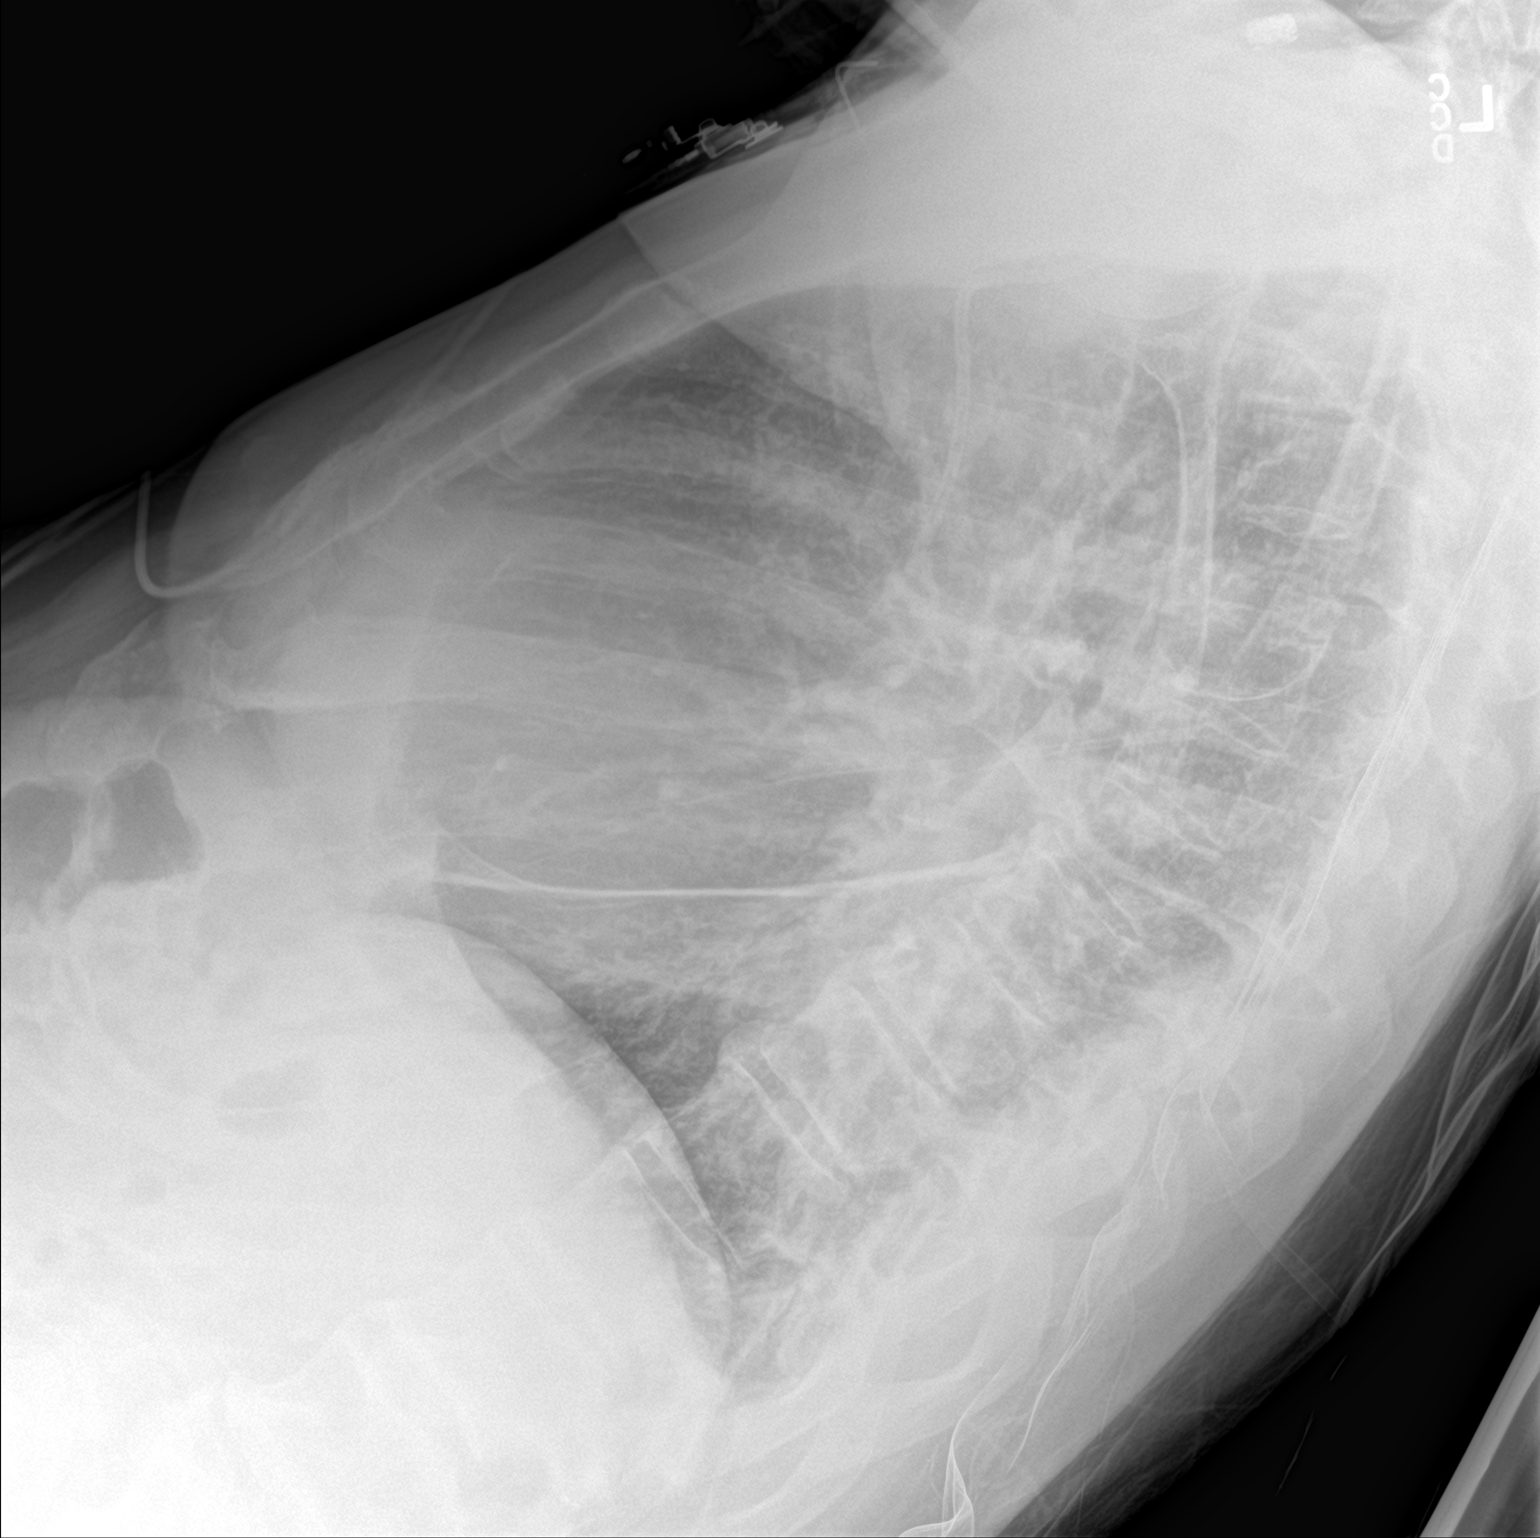

[chest ap]
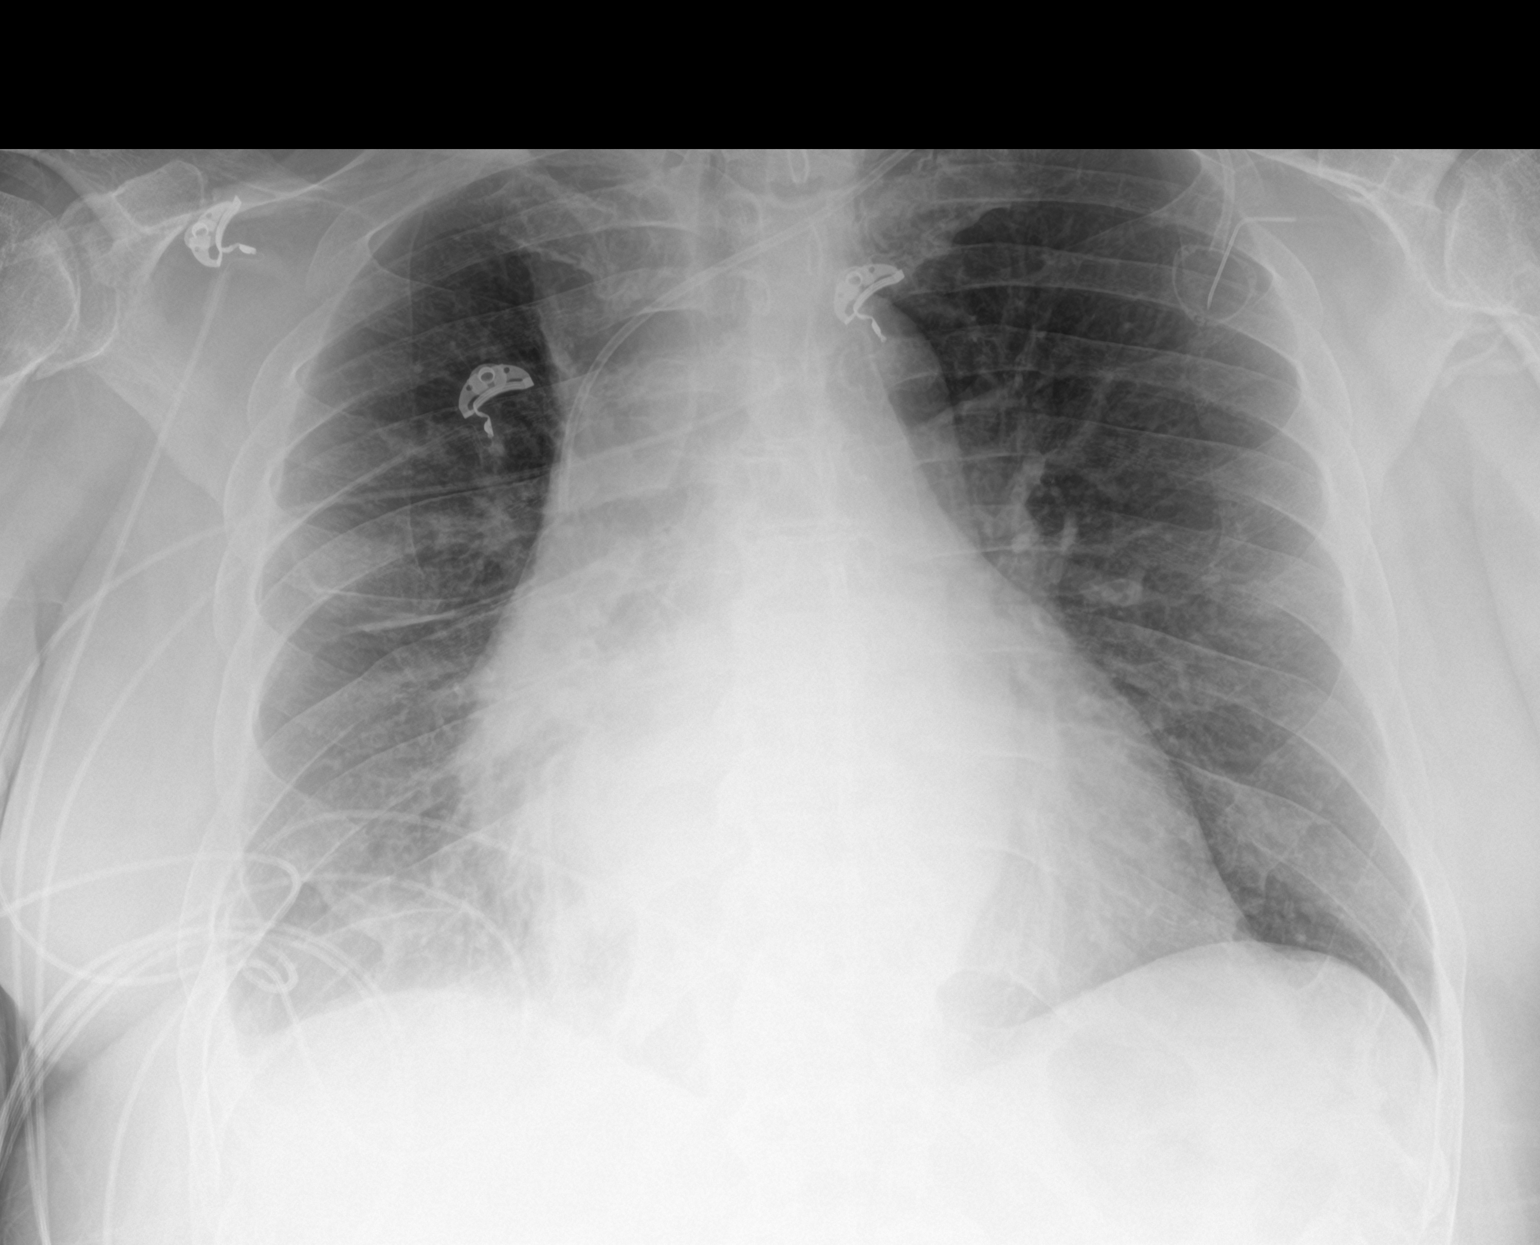

[2 of 2 positions shown; findings below may reference images not displayed]

FINDINGS: Stable cardiac enlargement and ectasia of the thoracic aorta. Stable
positioning of left subclavian Port-A-Cath. Stable chronic volume
loss of the right lung with right basilar scarring. There is no
evidence of pulmonary edema, consolidation, pneumothorax, nodule or
pleural fluid. The thoracic spine demonstrates mild degenerative
disc disease.
IMPRESSION: Stable cardiac enlargement and right basilar scarring. No definite
acute focal airspace disease identified.

## 2016-08-19 MED ORDER — WARFARIN SODIUM 6 MG PO TABS
6.0000 mg | ORAL_TABLET | Freq: Once | ORAL | Status: AC
  Administered 2016-08-19: 6 mg via ORAL
  Filled 2016-08-19: qty 1

## 2016-08-19 MED ORDER — ENSURE ENLIVE PO LIQD
237.0000 mL | Freq: Two times a day (BID) | ORAL | Status: DC
  Administered 2016-08-19: 237 mL via ORAL

## 2016-08-19 MED ORDER — SODIUM CHLORIDE 0.9% FLUSH
10.0000 mL | INTRAVENOUS | Status: DC | PRN
  Administered 2016-08-19 – 2016-08-20 (×2): 10 mL
  Filled 2016-08-19 (×2): qty 40

## 2016-08-19 MED ORDER — GUAIFENESIN-DM 100-10 MG/5ML PO SYRP
5.0000 mL | ORAL_SOLUTION | ORAL | Status: DC | PRN
  Administered 2016-08-19 (×2): 5 mL via ORAL
  Filled 2016-08-19 (×2): qty 10

## 2016-08-19 MED ORDER — PHENOL 1.4 % MT LIQD
1.0000 | OROMUCOSAL | Status: DC | PRN
  Administered 2016-08-19: 1 via OROMUCOSAL
  Filled 2016-08-19: qty 177

## 2016-08-19 NOTE — Progress Notes (Signed)
Physical Therapy Treatment Patient Details Name: ASER NYLUND MRN: [id number] DOB: 03-Jan-1951 Today's Date: 08/19/2016    History of Present Illness 66 yo male admitted with hypotension, + flu. Recent d/c 08/11/16. Hx of norovirus last admission, colon ca, colectomy 2017, anemia, A fib, CHF, HTN    PT Comments    Progressing slowly with mobility. Mod encouragement for participation and for pt to increase activity. O2 sats 96% on RA at rest, 91% on RA after ambulation. Pt declined sitting up in recliner. Recommend daily ambulation with nursing supervision. Would also recommend nursing reassess O2 sats prior to d/c.    Follow Up Recommendations  Home health PT;Supervision - Intermittent     Equipment Recommendations       Recommendations for Other Services       Precautions / Restrictions Precautions Precautions: Fall Precaution Comments: droplet +flu. Monitior O2 sats Restrictions Weight Bearing Restrictions: No    Mobility  Bed Mobility   Bed Mobility: Supine to Sit;Sit to Supine     Supine to sit: Modified independent (Device/Increase time);HOB elevated Sit to supine: Modified independent (Device/Increase time);HOB elevated   General bed mobility comments: Increased time and reliance on bedrail. No assist provided.   Transfers Overall transfer level: Needs assistance   Transfers: Sit to/from Stand Sit to Stand: Min guard;From elevated surface         General transfer comment: close guard for safety. Increased time. [place] on UEs and bedrail.   Ambulation/Gait Ambulation/Gait assistance: Min guard Ambulation Distance (Feet): 100 Feet Assistive device:  (IV pole) Gait Pattern/deviations: Step-through pattern;Decreased stride length     General Gait Details: very close guard for safety. Intermittently unsteady. O2 sats 91% on RA.    Stairs            Wheelchair Mobility    Modified Rankin (Stroke Patients Only)       Balance                                    Cognition Arousal/Alertness: Awake/alert Behavior During Therapy: WFL for tasks assessed/performed Overall Cognitive Status: Within Functional Limits for tasks assessed                      Exercises      General Comments        Pertinent Vitals/Pain Pain Assessment: No/denies pain    Home Living                      Prior Function            PT Goals (current goals can now be found in the care plan section) Progress towards PT goals: Progressing toward goals    Frequency    Min 3X/week      PT Plan Current plan remains appropriate    Co-evaluation             End of Session   Activity Tolerance: Patient tolerated treatment well Patient left: in bed;with call bell/phone within reach;with bed alarm set     Time: 1030-1049 PT Time Calculation (min) (ACUTE ONLY): 19 min  Charges:  $Gait Training: 8-22 mins                    G Codes:      Weston Anna, MPT Pager: [phone]

## 2016-08-19 NOTE — Care Management Note (Signed)
Case Management Note  Patient Details  Name: Steven Barnett MRN: [id number] Date of Birth: [birth year]  Subjective/Objective:PT-recc HH. Patient has medicaid-they will cover HHRN-safety eval, HH CSW-await order,& f32f.[facility] chosen for [employer] aware & following.                    Action/Plan:d/c plan home w/HHRN(home safety eval)/CSW.   Expected Discharge Date:   (unknown)               Expected Discharge Plan:  [place]  In-House Referral:     Discharge planning Services  CM Consult  Post Acute Care Choice:    Choice offered to:  Patient  DME Arranged:    DME Agency:     HH Arranged:    [place] Agency:  [place]  Status of Service:  In process, will continue to follow  If discussed at Long Length of Stay Meetings, dates discussed:    Additional Comments:  Dessa Phi, RN 08/19/2016, 11:53 AM

## 2016-08-19 NOTE — Progress Notes (Signed)
[place] for  warfarin Indication: atrial fibrillation  Allergies  Allergen Reactions  . No Known Allergies     Patient Measurements: Height: 5\' 10"  (177.8 cm) Weight: 240 lb 3.2 oz (109 kg) IBW/kg (Calculated) : 73   Vital Signs: Temp: 98.2 F (36.8 C) (02/16 0453) Temp Source: Oral (02/16 0453) BP: 106/68 (02/16 0453) Pulse Rate: 83 (02/16 0453)  Labs:  Recent Labs  08/17/16 1623 08/18/16 0502 08/19/16 0452  HGB 10.1* 9.7* 9.6*  HCT 30.5* 29.5* 29.6*  PLT 156 137* 124*  LABPROT 29.8* 28.2* 25.9*  INR 2.76 2.58 2.32  CREATININE 1.12 1.01 0.96    Estimated Creatinine Clearance: 96.1 mL/min (by C-G formula based on SCr of 0.96 mg/dL).  Medications:  Scheduled:  . acetaminophen  325 mg Oral Once  . aspirin EC  81 mg Oral Daily  . feeding supplement (ENSURE ENLIVE)  237 mL Oral BID BM  . ferrous sulfate  325 mg Oral BID WC  . guaiFENesin  600 mg Oral BID  . levalbuterol  0.63 mg Nebulization TID  . mouth rinse  15 mL Mouth Rinse BID  . metoprolol tartrate  25 mg Oral BID  . oseltamivir  75 mg Oral BID  . sodium chloride flush  3 mL Intravenous Q12H  . Warfarin - Pharmacist Dosing Inpatient   Does not apply q1800    Assessment: Pharmacy is consulted to dose warfarin in 66 yo male diagnosed with afib. Last dose 2/13. INR supratherapeutic at recent INR checks, 6.1 on 2/9  Pt was on 8mg  daily but now reducing dose after that INR level. Was told to hold warfarin fri, sat, sun and take 4mg  Mon, 8mg  Tue and Wed, 4mg  Thur and get INR checked 2/16.  Today, 08/19/16   INR therapeutic, slightly lower than yesterday  Hgb slightly lower than yesterday, plt 124 from 182 on admit  Positive for Influenza A  Goal of Therapy:  INR 2-3 Monitor platelets by anticoagulation protocol: Yes   Plan:   Warfarin 6 mg PO x1  Daily INR  CBC at least q72 hr  Monitor for signs and symptoms of bleeding   Modena Morrow, pharmacy  student

## 2016-08-19 NOTE — Progress Notes (Signed)
Steven Barnett AJG:[id number] DOB: [birth year] DOA: 08/17/2016 PCP: Elisabeth Cara, PA-C  Brief narrative: 66 year old male Atrial fibrillation, [place] score >2, on Coumadin 2010 History right-sided thoracotomy for empyema 11/2008-diagnosis A. 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 Burr Medico  FOLFOX chemotherapy last 1/25.  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% per echo 07/09/15 HTN Prior smoker OSA intolerant to CPAP diagnosed 2016-on Revatio supposedly Metabolic encephalopathy   Recent admission 2/3-2/8 norovirus, nausea, vomiting--hospitalization complicated with hyperkalemia needed pressors last admission  On that admission metoprolol, aspirin 25-->12.5 -2 second pauses and Cardizem was discontinued completely  seen at cardiology office 08/17/2016--found to be hypotensive percent ED for evaluation Noted at home to have a fever for 102 Empirically started on broad-spectrum bank and cefepime H influenza positive   Labs stable ProBNP 161 Lactic acid 0.7 WBC 3.9 hemoglobin 9.7 platelet 137-Baseline only mild thrombocytopenia CXR admission = mild interstitial edema secondary to CHF  Past medical history-As per Problem list Chart reviewed as below- reviewed  Consultants:  None yet  Procedures:  none  Antibiotics:  Tamiflu 2/14   Subjective  Better Eating and drinking better  no oxygen needs now Still a little weak and asking to walk later today    Objective    Interim History:   Telemetry: Sinus rhythm   Objective: Vitals:   08/19/16 1000 08/19/16 1003 08/19/16 1300 08/19/16 1450  BP:   110/71   Pulse:   80   Resp:   18   Temp:   98.5 F (36.9 C)   TempSrc:   Oral   SpO2: 97% 98% 99% 97%  Weight:      Height:        Intake/Output Summary (Last 24 hours) at 08/19/16 1756 Last data filed at 08/19/16 1340  Gross per 24 hour  Intake              2000 ml  Output             1875 ml  Net              125 ml    Exam:  General: Alert orientedstable Cardiovascular:  S1-S2 no murmur rub or galop Respiratory: Clinically clear no added sound Abdomen: Soft nontender nondistended no rebound no guarding Skin no lower extremity edema Neuro intact. An  Data Reviewed: Basic Metabolic Panel:  Recent Labs Lab 08/17/16 1214  08/17/16 1623 08/18/16 0502 08/19/16 0452  NA 135*  --  132* 134* 133*  K 4.4  < > 3.9 4.3 3.8  CL  --   --  98* 103 103  CO2 28  --  28 26 25   GLUCOSE 81  --  71 81 85  BUN 9.7  --  12 12 10   CREATININE 1.0  --  1.12 1.01 0.96  CALCIUM 8.2*  --  6.9* 7.2* 7.0*  < > = values in this interval not displayed. Liver Function Tests:  Recent Labs Lab 08/17/16 1214 08/17/16 1623 08/18/16 0502 08/19/16 0452  AST 47* 55* 77* 80*  ALT 18 21 26 27   ALKPHOS 136 112 118 119  BILITOT 0.77 1.1 0.8 0.5  PROT 6.8 6.3* 5.8* 5.7*  ALBUMIN 2.4* 2.5* 2.3* 2.1*   No results for input(s): LIPASE, AMYLASE in the last 168 hours. No results for input(s): AMMONIA in the last 168 hours. CBC:  Recent Labs Lab 08/17/16 1214 08/17/16 1623  08/18/16 0502 08/19/16 0452  WBC 5.5 5.6 3.9* 5.3  NEUTROABS 4.6 4.6  --  3.9  HGB 10.9* 10.1* 9.7* 9.6*  HCT 33.4* 30.5* 29.5* 29.6*  MCV 80.1 79.0 79.5 78.7  PLT 182 156 137* 124*   Cardiac Enzymes: No results for input(s): CKTOTAL, CKMB, CKMBINDEX, TROPONINI in the last 168 hours. BNP: Invalid input(s): POCBNP CBG: No results for input(s): GLUCAP in the last 168 hours.  Recent Results (from the past 240 hour(s))  Blood Culture (routine x 2)     Status: Abnormal   Collection Time: 08/17/16  4:23 PM  Result Value Ref Range Status   Specimen Description BLOOD PORTA CATH  Final   Special Requests BOTTLES DRAWN AEROBIC AND ANAEROBIC 5 CC EA  Final   Culture  Setup Time   Final    GRAM POSITIVE COCCI IN PAIRS IN CHAINS AEROBIC BOTTLE ONLY Organism ID to  follow CRITICAL RESULT CALLED TO, READ BACK BY AND VERIFIED WITH: A. Pham Pharm.D. 10:20 08/18/16 ([email or web address])    Culture (A)  Final    VIRIDANS STREPTOCOCCUS THE SIGNIFICANCE OF ISOLATING THIS ORGANISM FROM A SINGLE SET OF BLOOD CULTURES WHEN MULTIPLE SETS ARE DRAWN IS UNCERTAIN. PLEASE NOTIFY THE MICROBIOLOGY DEPARTMENT WITHIN ONE WEEK IF SPECIATION AND SENSITIVITIES ARE REQUIRED. Performed at [facility], [place] [address], [place], [place] [address]    Report Status 08/19/2016 FINAL  Final  Blood Culture (routine x 2)     Status: None (Preliminary result)   Collection Time: 08/17/16  4:23 PM  Result Value Ref Range Status   Specimen Description BLOOD LEFT ANTECUBITAL  Final   Special Requests IN PEDIATRIC BOTTLE 1CC  Final   Culture   Final    NO GROWTH 2 DAYS Performed at [facility], [place] [address] [place], [place] [address]    Report Status PENDING  Incomplete  Blood Culture ID Panel (Reflexed)     Status: Abnormal   Collection Time: 08/17/16  4:23 PM  Result Value Ref Range Status   Enterococcus species NOT DETECTED NOT DETECTED Final   Listeria monocytogenes NOT DETECTED NOT DETECTED Final   Staphylococcus species NOT DETECTED NOT DETECTED Final   Staphylococcus aureus NOT DETECTED NOT DETECTED Final   Streptococcus species DETECTED (A) NOT DETECTED Final    Comment: Not Enterococcus species, Streptococcus agalactiae, Streptococcus pyogenes, or Streptococcus pneumoniae. CRITICAL RESULT CALLED TO, READ BACK BY AND VERIFIED WITH: A. Pham Pharm.D. 10:20 08/18/16 ([email or web address])    Streptococcus agalactiae NOT DETECTED NOT DETECTED Final   Streptococcus pneumoniae NOT DETECTED NOT DETECTED Final   Streptococcus pyogenes NOT DETECTED NOT DETECTED Final   Acinetobacter baumannii NOT DETECTED NOT DETECTED Final   Enterobacteriaceae species NOT DETECTED NOT DETECTED Final   Enterobacter cloacae complex NOT DETECTED NOT DETECTED Final   Escherichia coli NOT DETECTED NOT DETECTED  Final   Klebsiella oxytoca NOT DETECTED NOT DETECTED Final   Klebsiella pneumoniae NOT DETECTED NOT DETECTED Final   Proteus species NOT DETECTED NOT DETECTED Final   Serratia marcescens NOT DETECTED NOT DETECTED Final   Haemophilus influenzae NOT DETECTED NOT DETECTED Final   Neisseria meningitidis NOT DETECTED NOT DETECTED Final   Pseudomonas aeruginosa NOT DETECTED NOT DETECTED Final   Candida albicans NOT DETECTED NOT DETECTED Final   Candida glabrata NOT DETECTED NOT DETECTED Final   Candida krusei NOT DETECTED NOT DETECTED Final   Candida parapsilosis NOT DETECTED NOT DETECTED Final   Candida tropicalis NOT DETECTED NOT DETECTED Final  Comment: Performed at [facility], [place] [address] [place], [place] [address]  Respiratory Panel by PCR     Status: Abnormal   Collection Time: 08/17/16  5:30 PM  Result Value Ref Range Status   Adenovirus NOT DETECTED NOT DETECTED Final   Coronavirus 229E NOT DETECTED NOT DETECTED Final   Coronavirus HKU1 NOT DETECTED NOT DETECTED Final   Coronavirus NL63 NOT DETECTED NOT DETECTED Final   Coronavirus OC43 NOT DETECTED NOT DETECTED Final   Metapneumovirus DETECTED (A) NOT DETECTED Final   Rhinovirus / Enterovirus NOT DETECTED NOT DETECTED Final   Influenza A H3 DETECTED (A) NOT DETECTED Final   Influenza B NOT DETECTED NOT DETECTED Final   Parainfluenza Virus 1 NOT DETECTED NOT DETECTED Final   Parainfluenza Virus 2 NOT DETECTED NOT DETECTED Final   Parainfluenza Virus 3 NOT DETECTED NOT DETECTED Final   Parainfluenza Virus 4 NOT DETECTED NOT DETECTED Final   Respiratory Syncytial Virus NOT DETECTED NOT DETECTED Final   Bordetella pertussis NOT DETECTED NOT DETECTED Final   Chlamydophila pneumoniae NOT DETECTED NOT DETECTED Final   Mycoplasma pneumoniae NOT DETECTED NOT DETECTED Final    Comment: Performed at [facility], [place] [address], [place], [place] [address]  Culture, blood (routine x 2) Call MD if unable to obtain prior  to antibiotics being given     Status: None (Preliminary result)   Collection Time: 08/17/16  9:05 PM  Result Value Ref Range Status   Specimen Description BLOOD LEFT ANTECUBITAL  Final   Special Requests IN PEDIATRIC BOTTLE 3 CC  Final   Culture   Final    NO GROWTH 1 DAY Performed at [facility], [place] [address] [place], [place] [address]    Report Status PENDING  Incomplete     Studies:              All Imaging reviewed and is as per above notation   Scheduled Meds: . acetaminophen  325 mg Oral Once  . aspirin EC  81 mg Oral Daily  . feeding supplement (ENSURE ENLIVE)  237 mL Oral BID BM  . ferrous sulfate  325 mg Oral BID WC  . guaiFENesin  600 mg Oral BID  . levalbuterol  0.63 mg Nebulization TID  . mouth rinse  15 mL Mouth Rinse BID  . metoprolol tartrate  25 mg Oral BID  . oseltamivir  75 mg Oral BID  . sodium chloride flush  3 mL Intravenous Q12H  . warfarin  6 mg Oral ONCE-1800  . Warfarin - Pharmacist Dosing Inpatient   Does not apply q1800   Continuous Infusions: . sodium chloride 75 mL/hr at 08/18/16 1815     Assessment/Plan:  1. Acute Respiratory Failure with Hypoxia 2/2 to Influenza A -Admit to Inpatient -Influenza PCR Positive for Influenza A; start Tamiflu -Vancomycin and Cefepime de-escalated on 2/15 -Wean O2 -no requirements a trest -Repeat CXR 08/19/16 neg for PNA -Supportive Care -Repeat differential in a.m. as he has mild pancytopenia and has been on Granix in the past  2. Hypotension, improving (has Hx of HTN) -In the setting of Dehydration, poor po intake and Diuretic use -Improving with IVF (1 Liter given in Er) and 500 mL given at Cardiology office -C/w IVF at 75 mL/hr--saline locked 2/16 -Recently Hospitalized with Norovirus previous admission -Hold Home Antihypertensives lisinopril 20 , Lasix 80, hydralazine 50 twice a day except Metoprolol 25 twice a day  -Blood pressure now 118-120/80's  3.  SIRS likely from URI/Influenza r/o  HCAP -Sepsis Protocol intiated in Ed; Lactate was 0.71; Afebrile (99.2) no WBC, however had Fever of 102 last night -Patient admits to coughing up clear/white sputum -Follow Cultures (blood, Urine, Sputum) -Mucinex -Breathing Tx with Xopenex  4. Chronic Persistent Atrial Fibrillation with RVR -CHADS2VASC = 2 -Pharmacy to dose Coumadin until INR is back as INR was supratherpeutic 5 days ago at 6.1 -C/w Metoprolol  5. Chronic HFpEF -Currently not decompensated -Strict I's and O's; Daily Weights  6. 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 Burr Medico in Oncology -He tells me that if he feels is poorly oncologist about this  7. OSA -C/w Nocturnal CPAP if tolerated -He has some pulmonary hypertension, continue Revatio 40 3 times a day if this is indication  8. Iron Deficiency Anemia -C/w Ferrous Sulfate -Repeat CBC in AM  No family present at the bedside-called and d/w wife 2/15 Have alerted his primary oncologist to his presence here as a courtesy through [facility] on tele Await resolution PT Eval--home likely 48 hrs   Verneita Griffes, MD  [facility] Pager [phone] 08/19/2016, 5:56 PM    LOS: 2 days

## 2016-08-19 NOTE — Evaluation (Signed)
Occupational Therapy Evaluation Patient Details Name: Steven Barnett MRN: [id number] DOB: 12/20/1950 Today's Date: 08/19/2016    History of Present Illness 66 yo male admitted with hypotension, + flu. Recent d/c 08/11/16. Hx of norovirus last admission, colon ca, colectomy 2017, anemia, A fib, CHF, HTN   Clinical Impression   Pt was admitted for the above.  He will benefit from one more OT session to further educate on tub transfer with a seat.      Follow Up Recommendations  No OT follow up;Supervision/Assistance - 24 hour    Equipment Recommendations  Tub/shower seat    Recommendations for Other Services       Precautions / Restrictions Precautions Precautions: Fall Precaution Comments: droplet +flu. Monitior O2 sats Restrictions Weight Bearing Restrictions: No      Mobility Bed Mobility   Bed Mobility: Supine to Sit;Sit to Supine by PT     Supine to sit: Modified independent (Device/Increase time);HOB elevated Sit to supine: Modified independent (Device/Increase time);HOB elevated   General bed mobility comments: oob   Transfers Overall transfer level: Needs assistance   Transfers: Sit to/from Stand Sit to Stand: Min guard         General transfer comment: close guard for safety. Increased time.    Balance                                            ADL Overall ADL's : Needs assistance/impaired     Grooming: Set up;Sitting   Upper Body Bathing: Set up;Sitting   Lower Body Bathing: Min guard;Sit to/from stand   Upper Body Dressing : Set up;Sitting   Lower Body Dressing: Minimal assistance;Sit to/from stand (help with sock due to toenail which catches)                 General ADL Comments: Pt up in chair. Educated on energy conservation. Pt has walked to bathroom and feels his standard commode will be fine.  Educated on energy conservation:  issued a long sponge for use when sitting on shower seat (requested).  Did not  simulate tub transfer this session; will do on next visit.     Vision     Perception     Praxis      Pertinent Vitals/Pain Pain Assessment: No/denies pain     Hand Dominance     Extremity/Trunk Assessment Upper Extremity Assessment Upper Extremity Assessment: Overall WFL for tasks assessed           Communication Communication Communication: No difficulties   Cognition Arousal/Alertness: Awake/alert Behavior During Therapy: WFL for tasks assessed/performed Overall Cognitive Status: Within Functional Limits for tasks assessed                     General Comments       Exercises       Shoulder Instructions      Home Living Family/patient expects to be discharged to:: Private residence Living Arrangements: Spouse/significant other Available Help at Discharge: Family Type of Home: Apartment             Bathroom Shower/Tub: Teacher, early years/pre: Standard     Home Equipment: None          Prior Functioning/Environment Level of Independence: Independent                 OT Problem  List: Decreased strength;Decreased activity tolerance;Decreased knowledge of use of DME or AE   OT Treatment/Interventions: Self-care/ADL training;Energy conservation;DME and/or AE instruction;Patient/family education    OT Goals(Current goals can be found in the care plan section) Acute Rehab OT Goals Patient Stated Goal: to feel better OT Goal Formulation: With patient Time For Goal Achievement: 08/26/16 Potential to Achieve Goals: Good ADL Goals Pt Will Perform Tub/Shower Transfer: with min guard assist;Tub transfer;shower seat  OT Frequency: Min 2X/week   Barriers to D/C:            Co-evaluation              End of Session    Activity Tolerance: Patient tolerated treatment well Patient left: in chair;with call bell/phone within reach   Time: 1355-1405 OT Time Calculation (min): 10 min Charges:  OT General Charges $OT  Visit: 1 Procedure OT Evaluation $OT Eval Low Complexity: 1 Procedure G-Codes:    Naome Brigandi 2016/08/24, 2:55 PM Lesle Chris, OTR/L [phone] 08/24/2016

## 2016-08-19 NOTE — Progress Notes (Signed)
SATURATION QUALIFICATIONS: (This note is used to comply with regulatory documentation for home oxygen)  Patient Saturations on Room Air at Rest = 96%  Patient Saturations on Room Air while Ambulating = 94%  Patient Saturations on 0 Liters of oxygen while Ambulating = 94%  Please briefly explain why patient needs home oxygen:

## 2016-08-20 LAB — BASIC METABOLIC PANEL
Anion gap: 3 — ABNORMAL LOW (ref 5–15)
BUN: 8 mg/dL (ref 6–20)
CO2: 28 mmol/L (ref 22–32)
CREATININE: 0.73 mg/dL (ref 0.61–1.24)
Calcium: 7.4 mg/dL — ABNORMAL LOW (ref 8.9–10.3)
Chloride: 103 mmol/L (ref 101–111)
GFR calc Af Amer: >60 mL/min (ref >60)
Glucose, Bld: 69 mg/dL (ref 65–99)
Potassium: 4.2 mmol/L (ref 3.5–5.1)
SODIUM: 134 mmol/L — AB (ref 135–145)

## 2016-08-20 LAB — PROTIME-INR
INR: 2.43
PROTHROMBIN TIME: 26.8 s — AB (ref 11.4–15.2)

## 2016-08-20 MED ORDER — WARFARIN SODIUM 4 MG PO TABS
4.0000 mg | ORAL_TABLET | Freq: Once | ORAL | Status: DC
  Filled 2016-08-20: qty 1

## 2016-08-20 MED ORDER — OSELTAMIVIR PHOSPHATE 75 MG PO CAPS: 75.0000 mg | ORAL_CAPSULE | Freq: Two times a day (BID) | ORAL | 0 refills | Status: DC

## 2016-08-20 MED ORDER — HEPARIN SOD (PORK) LOCK FLUSH 100 UNIT/ML IV SOLN
500.0000 [IU] | INTRAVENOUS | Status: AC | PRN
  Administered 2016-08-20: 500 [IU]

## 2016-08-20 NOTE — Progress Notes (Signed)
[place] for  warfarin Indication: atrial fibrillation  Allergies  Allergen Reactions  . No Known Allergies     Patient Measurements: Height: 5\' 10"  (177.8 cm) Weight: 253 lb 4.8 oz (114.9 kg) IBW/kg (Calculated) : 73   Vital Signs: Temp: 98.8 F (37.1 C) (02/17 0500) Temp Source: Oral (02/17 0500) BP: 120/73 (02/17 0500) Pulse Rate: 83 (02/17 0500)  Labs:  Recent Labs  08/17/16 1623 08/18/16 0502 08/19/16 0452 08/20/16 0438  HGB 10.1* 9.7* 9.6*  --   HCT 30.5* 29.5* 29.6*  --   PLT 156 137* 124*  --   LABPROT 29.8* 28.2* 25.9* 26.8*  INR 2.76 2.58 2.32 2.43  CREATININE 1.12 1.01 0.96 0.73    Estimated Creatinine Clearance: 118.5 mL/min (by C-G formula based on SCr of 0.73 mg/dL).  Medications:  Scheduled:  . acetaminophen  325 mg Oral Once  . aspirin EC  81 mg Oral Daily  . feeding supplement (ENSURE ENLIVE)  237 mL Oral BID BM  . ferrous sulfate  325 mg Oral BID WC  . guaiFENesin  600 mg Oral BID  . levalbuterol  0.63 mg Nebulization TID  . mouth rinse  15 mL Mouth Rinse BID  . metoprolol tartrate  25 mg Oral BID  . oseltamivir  75 mg Oral BID  . sodium chloride flush  3 mL Intravenous Q12H  . Warfarin - Pharmacist Dosing Inpatient   Does not apply q1800    Assessment: Pharmacy is consulted to dose warfarin in 66 yo male diagnosed with afib. Last dose 2/13. INR supratherapeutic at recent INR checks, 6.1 on 2/9  Pt was on 8mg  daily but now reducing dose after that INR level. Was told to hold warfarin fri, sat, sun and take 4mg  Mon, 8mg  Tue and Wed, 4mg  Thur and get INR checked 2/16.  Today, 08/20/16   INR therapeutic at 2.43  CBC: 2/16 - Hgb slightly lower than yesterday, plt 124 from 182 on admit  Positive for Influenza A  Goal of Therapy:  INR 2-3 Monitor platelets by anticoagulation protocol: Yes   Plan:   Warfarin 4 mg PO x1  If discharged, suggest take warfarin 4mg  PO daily until INR follow-up  early next week.   Daily INR  CBC at least q72 hr  Monitor for signs and symptoms of bleeding   Doreene Eland, PharmD, BCPS.   Pager: [phone] 08/20/2016 9:23 AM

## 2016-08-20 NOTE — Discharge Summary (Signed)
Physician Discharge Summary  Steven Barnett CLE:[id number] DOB: 28-Feb-1951 DOA: 08/17/2016  PCP: Elisabeth Cara, PA-C  Admit date: 08/17/2016 Discharge date: 08/20/2016  Time spent: 45 minutes  Recommendations for Outpatient Follow-up:  1. Patient diagnosed with lupus issue and will prescribe follow-up further doses of Tamiflu on discharge 2. Patient needs an INR check in about 4 days 3. Needs CBC and renal panel in about 1-2 weeks 4. Patient should follow-up with oncologist regarding discussion about his chemotherapy as he feels poorly on the second does not wish to use the same  Discharge Diagnoses:  Principal Problem:   Acute respiratory failure with hypoxia ([facility]) Active Problems:   Obstructive sleep apnea   Permanent atrial fibrillation ([place])   DIASTOLIC HEART FAILURE, CHRONIC   Current use of long term anticoagulation   Anemia, iron deficiency   Hypotension   SIRS (systemic inflammatory response syndrome) ([facility])   Influenza A   Discharge Condition: Improved  Diet recommendation: Heart healthy low-salt  Filed Weights   08/18/16 0600 08/19/16 0453 08/20/16 0500  Weight: 108.3 kg (238 lb 11.2 oz) 109 kg (240 lb 3.2 oz) 114.9 kg (253 lb 4.8 oz)    History of present illness:  66 year old male Atrial fibrillation, [place] score >2, on Coumadin 2010 History right-sided thoracotomy for empyema 11/2008-diagnosis A. 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 Burr Medico             FOLFOX chemotherapy last 1/25.  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% per echo 07/09/15 HTN Prior smoker OSA intolerant to CPAP diagnosed 2016-on Revatio supposedly Metabolic encephalopathy   Recent admission 2/3-2/8 norovirus, nausea, vomiting--hospitalization complicated with hyperkalemia needed pressors last admission  On that admission metoprolol, aspirin 25-->12.5 -2 second pauses and  Cardizem was discontinued completely  seen at cardiology office 08/17/2016--found to be hypotensive percent ED for evaluation Noted at home to have a fever for 102 Empirically started on broad-spectrum bank and cefepime H influenza positive   Labs stable ProBNP 161 Lactic acid 0.7 WBC 3.9 hemoglobin 9.7 platelet 137-Baseline only mild thrombocytopenia CXR admission = mild interstitial edema secondary to CHF  Hospital Course:  Patient was admitted initially with hypoxia and found to have H influenza positive-started on Tamiflu and broad-spectrum antibiotics were D escalate He was hypotensive on admission given IV saline 75 cc per hour saline lock 2/16 and antihypertensives which were held including lisinopril and Lasix and hydralazine on discharge. Joint hospitalization he continued on his metoprolol twice a day His atrial fibrillation ablation was stable in the hospital and pharmacy dosed his Coumadin-he was therapeutic on discharge 2.4 and will need close check on his INR has been has been previously as high as 6 and I would recommend this checked in 4 days  stage III cancer of right colon's hemicolectomy was stable and it was felt that he should discuss his concerns about poor tolerance of chemotherapy and feeling washed out with Dr. Burr Medico oncology    Discharge Exam: Vitals:   08/19/16 2106 08/20/16 0500  BP: 125/81 120/73  Pulse: 90 83  Resp: 18 20  Temp: 98.8 F (37.1 C) 98.8 F (37.1 C)    General: Alert pleasant oriented and in the hallway eating and drinking without patient feels a lot better . Expresses some concerns about going home as the rest of the family is ill however I reassured him that he should be on the mend andto  continue Tamiflu.  Cardiovascular: s1 s 2no m/r/g Respiratory: clear no wheeze no rales,  Some dullness on LL lung field.  Afebrile to touch  Discharge Instructions   Discharge Instructions    Diet - low sodium heart healthy    Complete by:  As  directed    Discharge instructions    Complete by:  As directed    tamiflu to continue for 2 more days. Labs as an OP Get an INR in 4 days Follow with your regular Md in about 2 weeks Follow with Dr. Burr Medico regarding your cancer care as an OP and talk to her about your concerns for your chemo Wear a amask to avoid infecting others Take rest at least for 5-7 days prior to increasing activity to normal   Increase activity slowly    Complete by:  As directed      Current Discharge Medication List    START taking these medications   Details  oseltamivir (TAMIFLU) 75 MG capsule Take 1 capsule (75 mg total) by mouth 2 (two) times daily. Qty: 4 capsule, Refills: 0      CONTINUE these medications which have NOT CHANGED   Details  acetaminophen (TYLENOL) 500 MG tablet Take 1,000 mg by mouth 2 (two) times daily.    albuterol (PROVENTIL HFA;VENTOLIN HFA) 108 (90 Base) MCG/ACT inhaler Inhale 2 puffs into the lungs every 6 (six) hours as needed for wheezing or shortness of breath. Qty: 1 Inhaler, Refills: 2    aspirin EC 81 MG tablet Take 81 mg by mouth daily.    docusate sodium (COLACE) 100 MG capsule Take 1 capsule (100 mg total) by mouth 2 (two) times daily. For constipation Qty: 60 capsule, Refills: 3    ferrous sulfate 325 (65 FE) MG tablet Take 1 tablet (325 mg total) by mouth 2 (two) times daily with a meal. Qty: 60 tablet, Refills: 3    furosemide (LASIX) 80 MG tablet Take 1 tablet (80 mg total) by mouth daily.    hydrALAZINE (APRESOLINE) 50 MG tablet TAKE ONE TABLET BY MOUTH TWICE DAILY Qty: 60 tablet, Refills: 6    ipratropium (ATROVENT HFA) 17 MCG/ACT inhaler Inhale 2 puffs into the lungs every 6 (six) hours as needed for wheezing.     lisinopril (PRINIVIL,ZESTRIL) 20 MG tablet TAKE ONE TABLET BY MOUTH ONCE DAILY Qty: 30 tablet, Refills: 10    metoprolol tartrate (LOPRESSOR) 25 MG tablet TAKE ONE TABLET BY MOUTH TWICE DAILY Qty: 30 tablet, Refills: 6    potassium  chloride SA (K-DUR,KLOR-CON) 20 MEQ tablet Take 2 tablets (40 mEq total) by mouth daily. Qty: 180 tablet, Refills: 3    sildenafil (REVATIO) 20 MG tablet TAKE TWO TO THREE TABLETS BY MOUTH ONCE DAILY AS NEEDED FOR SEXUAL ACTIVITY Qty: 30 tablet, Refills: 2    warfarin (COUMADIN) 4 MG tablet Take 4 mg by mouth daily. Monday- 1 tab Tuesday, wednesday- 2 tabs Thursday- 1 tab Friday, Saturday, Sunday- no coumadin    lidocaine-prilocaine (EMLA) cream Apply to affected area once Qty: 30 g, Refills: 3   Associated Diagnoses: Cancer of right colon ([facility])       Allergies  Allergen Reactions  . No Known Allergies       The results of significant diagnostics from this hospitalization (including imaging, microbiology, ancillary and laboratory) are listed below for reference.    Significant Diagnostic Studies: Dg Chest 2 View  Result Date: 08/19/2016 CLINICAL DATA:  Shortness of breath, weakness, influenza, fever, hypotension and rectal carcinoma.  EXAM: CHEST  2 VIEW COMPARISON:  08/17/2016 FINDINGS: Stable cardiac enlargement and ectasia of the thoracic aorta. Stable positioning of left subclavian Port-A-Cath. Stable chronic volume loss of the right lung with right basilar scarring. There is no evidence of pulmonary edema, consolidation, pneumothorax, nodule or pleural fluid. The thoracic spine demonstrates mild degenerative disc disease. IMPRESSION: Stable cardiac enlargement and right basilar scarring. No definite acute focal airspace disease identified. Electronically Signed   By: Aletta Edouard M.D.   On: 08/19/2016 09:38   US Renal  Result Date: 08/07/2016 CLINICAL DATA:  Acute renal failure. EXAM: RENAL / URINARY TRACT ULTRASOUND COMPLETE COMPARISON:  None. FINDINGS: Right Kidney: Length: 12.1 cm. 7 mm nonobstructive calculus is seen in upper pole. Echogenicity within normal limits. No mass or hydronephrosis visualized. Left Kidney: Length: 11.9 cm. 9 mm calculus is noted in midpole.  Echogenicity within normal limits. No mass or hydronephrosis visualized. Bladder: Decompressed secondary to Foley catheter. IMPRESSION: Bilateral nonobstructive nephrolithiasis. No hydronephrosis is noted. Electronically Signed   By: Marijo Conception, M.D.   On: 08/07/2016 07:48   Dg Chest Port 1 View  Result Date: 08/17/2016 CLINICAL DATA:  Follow-up from recent hospitalization for sepsis. The patient is hypotensive and 10 sitting complains of fatigue and fever. History of CHF, morbid obesity colonic malignancy, former smoker. EXAM: PORTABLE CHEST 1 VIEW COMPARISON:  Chest x-ray of August 06, 2016 FINDINGS: The lungs are adequately inflated. The interstitial markings are more conspicuous bilaterally. The cardiac silhouette is enlarged and also more conspicuous. The pulmonary vascularity is more engorged. There is no pleural effusion. The Port-A-Cath appliance tip projects over the midportion of the SVC. IMPRESSION: Findings consistent with mild interstitial edema secondary to CHF. Underlying chronic bronchitic changes. No alveolar pneumonia. Electronically Signed   By: David  Martinique M.D.   On: 08/17/2016 16:40   Dg Chest Port 1 View  Result Date: 08/06/2016 CLINICAL DATA:  Respiratory distress, hypoxia and hypotension. EXAM: PORTABLE CHEST 1 VIEW COMPARISON:  05/18/2016 FINDINGS: Stable appearance of left-sided Port-A-Cath with the catheter tip in the SVC. Lungs show stable chronic lung disease without edema, airspace consolidation, pleural fluid or pneumothorax. The heart size is at the upper limits of normal. IMPRESSION: Stable chronic lung disease.  No acute findings. Electronically Signed   By: Aletta Edouard M.D.   On: 08/06/2016 14:58    Microbiology: Recent Results (from the past 240 hour(s))  Blood Culture (routine x 2)     Status: Abnormal   Collection Time: 08/17/16  4:23 PM  Result Value Ref Range Status   Specimen Description BLOOD PORTA CATH  Final   Special Requests BOTTLES DRAWN  AEROBIC AND ANAEROBIC 5 CC EA  Final   Culture  Setup Time   Final    GRAM POSITIVE COCCI IN PAIRS IN CHAINS AEROBIC BOTTLE ONLY Organism ID to follow CRITICAL RESULT CALLED TO, READ BACK BY AND VERIFIED WITH: A. Pham Pharm.D. 10:20 08/18/16 ([email or web address])    Culture (A)  Final    VIRIDANS STREPTOCOCCUS THE SIGNIFICANCE OF ISOLATING THIS ORGANISM FROM A SINGLE SET OF BLOOD CULTURES WHEN MULTIPLE SETS ARE DRAWN IS UNCERTAIN. PLEASE NOTIFY THE MICROBIOLOGY DEPARTMENT WITHIN ONE WEEK IF SPECIATION AND SENSITIVITIES ARE REQUIRED. Performed at [facility], [place] [address], [place],  [address]    Report Status 08/19/2016 FINAL  Final  Blood Culture (routine x 2)     Status: None (Preliminary result)   Collection Time: 08/17/16  4:23 PM  Result Value Ref Range Status  Specimen Description BLOOD LEFT ANTECUBITAL  Final   Special Requests IN PEDIATRIC BOTTLE 1CC  Final   Culture   Final    NO GROWTH 2 DAYS Performed at [facility], [address] [place], [place] [address]    Report Status PENDING  Incomplete  Blood Culture ID Panel (Reflexed)     Status: Abnormal   Collection Time: 08/17/16  4:23 PM  Result Value Ref Range Status   Enterococcus species NOT DETECTED NOT DETECTED Final   Listeria monocytogenes NOT DETECTED NOT DETECTED Final   Staphylococcus species NOT DETECTED NOT DETECTED Final   Staphylococcus aureus NOT DETECTED NOT DETECTED Final   Streptococcus species DETECTED (A) NOT DETECTED Final    Comment: Not Enterococcus species, Streptococcus agalactiae, Streptococcus pyogenes, or Streptococcus pneumoniae. CRITICAL RESULT CALLED TO, READ BACK BY AND VERIFIED WITH: A. Pham Pharm.D. 10:20 08/18/16 ([email or web address])    Streptococcus agalactiae NOT DETECTED NOT DETECTED Final   Streptococcus pneumoniae NOT DETECTED NOT DETECTED Final   Streptococcus pyogenes NOT DETECTED NOT DETECTED Final   Acinetobacter baumannii NOT DETECTED NOT DETECTED Final   Enterobacteriaceae  species NOT DETECTED NOT DETECTED Final   Enterobacter cloacae complex NOT DETECTED NOT DETECTED Final   Escherichia coli NOT DETECTED NOT DETECTED Final   Klebsiella oxytoca NOT DETECTED NOT DETECTED Final   Klebsiella pneumoniae NOT DETECTED NOT DETECTED Final   Proteus species NOT DETECTED NOT DETECTED Final   Serratia marcescens NOT DETECTED NOT DETECTED Final   Haemophilus influenzae NOT DETECTED NOT DETECTED Final   Neisseria meningitidis NOT DETECTED NOT DETECTED Final   Pseudomonas aeruginosa NOT DETECTED NOT DETECTED Final   Candida albicans NOT DETECTED NOT DETECTED Final   Candida glabrata NOT DETECTED NOT DETECTED Final   Candida krusei NOT DETECTED NOT DETECTED Final   Candida parapsilosis NOT DETECTED NOT DETECTED Final   Candida tropicalis NOT DETECTED NOT DETECTED Final    Comment: Performed at [facility], [place] [address], [place], [place] [address]  Respiratory Panel by PCR     Status: Abnormal   Collection Time: 08/17/16  5:30 PM  Result Value Ref Range Status   Adenovirus NOT DETECTED NOT DETECTED Final   Coronavirus 229E NOT DETECTED NOT DETECTED Final   Coronavirus HKU1 NOT DETECTED NOT DETECTED Final   Coronavirus NL63 NOT DETECTED NOT DETECTED Final   Coronavirus OC43 NOT DETECTED NOT DETECTED Final   Metapneumovirus DETECTED (A) NOT DETECTED Final   Rhinovirus / Enterovirus NOT DETECTED NOT DETECTED Final   Influenza A H3 DETECTED (A) NOT DETECTED Final   Influenza B NOT DETECTED NOT DETECTED Final   Parainfluenza Virus 1 NOT DETECTED NOT DETECTED Final   Parainfluenza Virus 2 NOT DETECTED NOT DETECTED Final   Parainfluenza Virus 3 NOT DETECTED NOT DETECTED Final   Parainfluenza Virus 4 NOT DETECTED NOT DETECTED Final   Respiratory Syncytial Virus NOT DETECTED NOT DETECTED Final   Bordetella pertussis NOT DETECTED NOT DETECTED Final   Chlamydophila pneumoniae NOT DETECTED NOT DETECTED Final   Mycoplasma pneumoniae NOT DETECTED NOT DETECTED Final     Comment: Performed at [facility], [place] [address], [place], [place] [address]  Culture, blood (routine x 2) Call MD if unable to obtain prior to antibiotics being given     Status: None (Preliminary result)   Collection Time: 08/17/16  9:05 PM  Result Value Ref Range Status   Specimen Description BLOOD LEFT ANTECUBITAL  Final   Special Requests IN PEDIATRIC BOTTLE 3  CC  Final   Culture   Final    NO GROWTH 1 DAY Performed at [facility], [place] [address], [place], [place] [address]    Report Status PENDING  Incomplete     Labs: Basic Metabolic Panel:  Recent Labs Lab 08/17/16 1214 08/17/16 1623 08/18/16 0502 08/19/16 0452 08/20/16 0438  NA 135* 132* 134* 133* 134*  K 4.4 3.9 4.3 3.8 4.2  CL  --  98* 103 103 103  CO2 28 28 26 25 28   GLUCOSE 81 71 81 85 69  BUN 9.7 12 12 10 8   CREATININE 1.0 1.12 1.01 0.96 0.73  CALCIUM 8.2* 6.9* 7.2* 7.0* 7.4*   Liver Function Tests:  Recent Labs Lab 08/17/16 1214 08/17/16 1623 08/18/16 0502 08/19/16 0452  AST 47* 55* 77* 80*  ALT 18 21 26 27   ALKPHOS 136 112 118 119  BILITOT 0.77 1.1 0.8 0.5  PROT 6.8 6.3* 5.8* 5.7*  ALBUMIN 2.4* 2.5* 2.3* 2.1*   No results for input(s): LIPASE, AMYLASE in the last 168 hours. No results for input(s): AMMONIA in the last 168 hours. CBC:  Recent Labs Lab 08/17/16 1214 08/17/16 1623 08/18/16 0502 08/19/16 0452  WBC 5.5 5.6 3.9* 5.3  NEUTROABS 4.6 4.6  --  3.9  HGB 10.9* 10.1* 9.7* 9.6*  HCT 33.4* 30.5* 29.5* 29.6*  MCV 80.1 79.0 79.5 78.7  PLT 182 156 137* 124*   Cardiac Enzymes: No results for input(s): CKTOTAL, CKMB, CKMBINDEX, TROPONINI in the last 168 hours. BNP: BNP (last 3 results)  Recent Labs  08/08/16 0500 08/17/16 1623 08/17/16 2106  BNP 54.6 203.0* 161.4*    ProBNP (last 3 results) No results for input(s): PROBNP in the last 8760 hours.  CBG: No results for input(s): GLUCAP in the last 168 hours.     SignedNita Sells MD   [facility] 08/20/2016, 10:37 AM

## 2016-08-22 LAB — CULTURE, BLOOD (ROUTINE X 2): Culture: NO GROWTH

## 2016-08-23 LAB — CULTURE, BLOOD (ROUTINE X 2): Culture: NO GROWTH

## 2016-08-24 NOTE — Progress Notes (Signed)
[place]  [phone] [phone]  Clinic Follow up Note   Patient Care Team: Elisabeth Cara, PA-C as PCP - General (Family Medicine) [name], MD as Consulting Physician (General Surgery) Tania Ade, RN as Registered Nurse 08/25/2016   CHIEF COMPLAINTS:  Follow up stage III colon cancer  Oncology History   Cancer of right colon [facility])   Staging form: Colon and Rectum, AJCC 7th Edition   - Clinical stage from 04/12/2016: Stage IIIA (T2, N1, M0) - Signed by [name], MD on 05/01/2016      Cancer of right colon ([place])   03/04/2016 Procedure    COLONOSCOPY: A polypoid and ulcerated non-obstructing large mass was found in the cecum. The mass was noncircumferential. The mass measured three cm in length. In addition, its diameter measured three mm. (Dr. [name])       03/15/2016 Imaging    1. Polypoid cecal mass. No findings of liver metastatic disease or other definite metastatic disease. There are some small adjacent pericecal lymph nodes which are not pathologically enlarged. 2. Adenopathy in the chest is present but was also present in 2010, albeit slightly less prominent. This may be reactive adenopathy related to the chronic exudative right pleural effusion.       04/07/2016 Tumor Marker    Patient's tumor was tested for the following markers: CEA. Results of the tumor marker test revealed 2.7.      04/12/2016 Initial Diagnosis    Cecal cancer ([place])     04/12/2016 Definitive Surgery    Laparoscopic right hemicolectomy (Dr. [name])      04/12/2016 Pathologic Stage    pT2 pN1 pMX--Grade 3 adenocarcinoma with 2/16 nodes positive, clear margins, negative for perineural or lymphvascular invasion; invades muscularis propria Loss of expression MLH1/PMS2      06/02/2016 -  Adjuvant Chemotherapy     Ajuvant chemotherapy FOLFOX, every 2 weeks, plan for 3 months       06/29/2016 Genetic Testing    POLE [id number]  identified on the Colorectal cancer panel.  Negative genetic testing for the MSH2 inversion analysis ([place] inversion). The Colorectal Cancer Panel offered by [facility] includes sequencing and/or duplication/deletion testing of the following 19 genes: APC, ATM, AXIN2, BMPR1A, CDH1, CHEK2, EPCAM, MLH1, MSH2, MSH6, MUTYH, PMS2, POLD1, POLE, PTEN, SCG5/GREM1, SMAD4, STK11, and TP53. The report date is 06/22/2016 for the [facility] panel and 06/29/2016 for the [place] inversion.      08/17/2016 - 08/20/2016 Hospital Admission    Admit date: 08/17/2016 Admission diagnosis: Acute respiratory failure with hypoxia  Additional comments: Patient was admitted initially with hypoxia and found to have H influenza positive-started on Tamiflu and broad-spectrum antibiotics were D escalate. He was hypotensive on admission given IV saline 75 cc per hour saline lock 2/16 and antihypertensives which were held including lisinopril and Lasix and hydralazine on discharge. Joint hospitalization he continued on his metoprolol twice a day       HISTORY OF PRESENTING ILLNESS (04/27/2016):  Steven Barnett 66 y.o. male is here because of his recently diagnosed stage III colon cancer. She is accompanied by her wife to my clinic today.  This was discovered by screening colonoscopy in 03/2016. At the time of his screening colonoscopy, he was found to have anemia and iron deficiency. He has had mild to moderate fatigue and dyspnea on exertion for several months. Colonoscopy showed a large no obstructive mass in the cecum, biopsy showed adenocarcinoma. He was referred to see surgeon Dr.  Byerly and underwent right hemicolectomy on 04/12/2016. He has been recovering slowly from surgery, still has mild to moderate pain at the incision, his energy level and appetite has not recovered well.   He was hospitalized for CHF in 09/2015 and was found to have pleural effusion, he is on lasix. He is able to do all his ADLs, and light activities, such as house  work. He is not very physically active.    CURRENT THERAPY: Ajuvant chemotherapy FOLFOX, every 2 weeks, starting 06/02/2016  INTERIM HISTORY:  Mr. Timmons returns for follow-up and his 6th cycle chemo. He was hospitalized for flu a few weeks ago, after chemo and feels very tired and weak. He has had a very small appetite due to nothing tasting good. He drinks one Boost a day, but doesn't eat much food. He has lost weight. He says he has been drinking about 30 oz of water every day. His bowel movements have been good; no longer loose. He has a bowel movement once a day. He has some SOB with exertion. Denies nausea, leg swelling, or any other concerns.   MEDICAL HISTORY:  Past Medical History:  Diagnosis Date  . Anemia   . Arthralgia of both knees   . Arthritis    "right knee" (10/01/2015)  . Atrial fibrillation ([place])   . Cancer (HCC)    STAGE 1 COLON CANCER  . Chronic anticoagulation 2010   Coumadin  . Chronic diastolic CHF (congestive heart failure), NYHA class 2 ([place])   . Hypertension   . OSA (obstructive sleep apnea) 2016   "couldn't take the mask during the testing" (10/01/2015)  . Permanent atrial fibrillation ([place]) 2010  . Pneumonia 3/29/2017and june 2017    SURGICAL HISTORY: Past Surgical History:  Procedure Laterality Date  . COLONOSCOPY WITH PROPOFOL N/A 03/04/2016   Procedure: COLONOSCOPY WITH PROPOFOL;  Surgeon: Carol Ada, MD;  Location: [facility];  Service: Endoscopy;  Laterality: N/A;  . LAPAROSCOPIC PARTIAL COLECTOMY N/A 04/12/2016   Procedure: LAPAROSCOPIC ILEOCOLECTOMY;  Surgeon: [name], MD;  Location: [place];  Service: General;  Laterality: N/A;  . PORTACATH PLACEMENT N/A 05/18/2016   Procedure: INSERTION PORT-A-CATH;  Surgeon: [name], MD;  Location: [place];  Service: General;  Laterality: N/A;  . THORACOTOMY Right 2010    SOCIAL HISTORY: Social History   Social History  . Marital status: Married    Spouse name: Ronnette Juniper  .  Number of children: N/A  . Years of education: N/A   Occupational History  . Not on file.   Social History Main Topics  . Smoking status: Former Smoker    Packs/day: 0.10    Years: 2.00    Types: Cigarettes    Quit date: 12/16/1974  . Smokeless tobacco: Never Used     Comment: "1 pack cigarettes would last me a month"  . Alcohol use 0.0 oz/week     Comment: 10/01/2015 "might have1 6 pack of beer/year"  . Drug use: Yes    Types: Cocaine, Marijuana     Comment: Hx polysubstance abuse, quit 2008  . Sexual activity: Not Currently   Other Topics Concern  . 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   FAMILY HISTORY: Family History  Problem Relation Age of Onset  . Heart disease Father   . Heart failure Father   . Colon polyps Father     unspecified number -  pt of intestine surgically resected  . Hypertension Mother   . Diabetes Mother   . Hyperlipidemia Mother   . Colon polyps Mother     unspecified number - pt of intestine surgically resected  . Dementia Mother     d. 58  . Stroke Maternal Grandmother   . Stroke Sister   . Colon cancer Sister 72    w/ "cancerous polyps" - unspecified number; underwent surgery and radiation  . Colon polyps Brother     unspecified number - polypectomies  . Colon cancer Sister     dx 71-60; s/p surgery and radiation  . Bone cancer Maternal Uncle     dx. 84s  . Lung cancer Paternal Grandfather     d. 80s-90s; lung cancer vs TB  . Colon cancer Sister     dx. 62-60; s/p surgery and radiation  . Cancer Maternal Uncle     mother had about 7-8 other siblings, most passed at older ages, many of whom had some type of cancer  . Heart attack Neg Hx     ALLERGIES:  is allergic to no known allergies.  MEDICATIONS:  Current Outpatient Prescriptions  Medication Sig Dispense Refill  . acetaminophen (TYLENOL) 500 MG tablet Take 1,000 mg by mouth 2 (two) times  daily.    Marland Kitchen albuterol (PROVENTIL HFA;VENTOLIN HFA) 108 (90 Base) MCG/ACT inhaler Inhale 2 puffs into the lungs every 6 (six) hours as needed for wheezing or shortness of breath. 1 Inhaler 2  . aspirin EC 81 MG tablet Take 81 mg by mouth daily.    . ferrous sulfate 325 (65 FE) MG tablet Take 1 tablet (325 mg total) by mouth 2 (two) times daily with a meal. 60 tablet 3  . furosemide (LASIX) 80 MG tablet Take 1 tablet (80 mg total) by mouth daily.    . hydrALAZINE (APRESOLINE) 50 MG tablet TAKE ONE TABLET BY MOUTH TWICE DAILY 60 tablet 6  . ipratropium (ATROVENT HFA) 17 MCG/ACT inhaler Inhale 2 puffs into the lungs every 6 (six) hours as needed for wheezing.     . lidocaine-prilocaine (EMLA) cream Apply to affected area once (Patient taking differently: Apply 1 application topically once. Apply to affected area once) 30 g 3  . lisinopril (PRINIVIL,ZESTRIL) 20 MG tablet TAKE ONE TABLET BY MOUTH ONCE DAILY 30 tablet 10  . metoprolol tartrate (LOPRESSOR) 25 MG tablet TAKE ONE TABLET BY MOUTH TWICE DAILY 30 tablet 6  . potassium chloride SA (K-DUR,KLOR-CON) 20 MEQ tablet Take 2 tablets (40 mEq total) by mouth daily. 180 tablet 3  . warfarin (COUMADIN) 4 MG tablet Take 4 mg by mouth daily. Monday- 1 tab Tuesday, wednesday- 2 tabs Thursday- 1 tab Friday, Saturday, Sunday- no coumadin    . docusate sodium (COLACE) 100 MG capsule Take 1 capsule (100 mg total) by mouth 2 (two) times daily. For constipation (Patient not taking: Reported on 08/25/2016) 60 capsule 3  . sildenafil (REVATIO) 20 MG tablet TAKE TWO TO THREE TABLETS BY MOUTH ONCE DAILY AS NEEDED FOR SEXUAL ACTIVITY (Patient not taking: Reported on 08/25/2016) 30 tablet 2   No current facility-administered medications for this visit.     REVIEW OF SYSTEMS: Constitutional: Denies fevers, chills or abnormal night sweats, (+) fatigue, weakness, loss of appetite, weight loss Eyes: Denies double vision or watery eyes  Ears, nose, mouth, throat, and  face: Denies mucositis or sore throat Respiratory: Denies cough, dyspnea or wheezes (+) SOB Cardiovascular: Denies palpitation, chest discomfort or lower extremity swelling,  Gastrointestinal:  Denies nausea, heartburn or change in bowel habits Skin: Denies abnormal skin rashes Lymphatics: Denies new lymphadenopathy or easy bruising Musculoskeletal: Negative Neurological:Denies numbness, tingling or new weaknesses Behavioral/Psych: Mood is stable, no new changes  All other systems were reviewed with the patient and are negative.  PHYSICAL EXAMINATION: ECOG PERFORMANCE STATUS: 2-3  Vitals:   08/25/16 1056  BP: 114/72  Pulse: 76  Resp: 18  Temp: 98 F (36.7 C)   Filed Weights   08/25/16 1056  Weight: 236 lb 14.4 oz (107.5 kg)    GENERAL:alert, no distress and comfortable SKIN: skin color, texture, turgor are normal, no rashes or significant lesions EYES: normal, conjunctiva are pink and non-injected, sclera clear OROPHARYNX:no exudate, no erythema and lips, buccal mucosa, and tongue normal  NECK: supple, thyroid normal size, non-tender, without nodularity LYMPH:  no palpable lymphadenopathy in the cervical, axillary or inguinal LUNGS: clear to auscultation and percussion with normal breathing effort HEART: regular rate & rhythm and no murmurs and no lower extremity edema ABDOMEN:abdomen soft, non-tender and normal bowel sounds. (+) midline incision appears healing well, no discharge Musculoskeletal:no cyanosis of digits and no clubbing  PSYCH: alert & oriented x 3 with fluent speech NEURO: no focal motor/sensory deficits  LABORATORY DATA:  I have reviewed the data as listed CBC Latest Ref Rng & Units 08/25/2016 08/19/2016 08/18/2016  WBC 4.0 - 10.3 10e3/uL 4.3 5.3 3.9(L)  Hemoglobin 13.0 - 17.1 g/dL 10.8(L) 9.6(L) 9.7(L)  Hematocrit 38.4 - 49.9 % 33.4(L) 29.6(L) 29.5(L)  Platelets 140 - 400 10e3/uL 219 124(L) 137(L)   CMP Latest Ref Rng & Units 08/25/2016 08/20/2016 08/19/2016    Glucose 70 - 140 mg/dl 77 69 85  BUN 7.0 - 26.0 mg/dL 7.4 8 10   Creatinine 0.7 - 1.3 mg/dL 0.8 0.73 0.96  Sodium 136 - 145 mEq/L 136 134(L) 133(L)  Potassium 3.5 - 5.1 mEq/L 4.3 4.2 3.8  Chloride 101 - 111 mmol/L - 103 103  CO2 22 - 29 mEq/L 28 28 25   Calcium 8.4 - 10.4 mg/dL 8.8 7.4(L) 7.0(L)  Total Protein 6.4 - 8.3 g/dL 6.7 - 5.7(L)  Total Bilirubin 0.20 - 1.20 mg/dL 0.51 - 0.5  Alkaline Phos 40 - 150 U/L 150 - 119  AST 5 - 34 U/L 35(H) - 80(H)  ALT 0 - 55 U/L 21 - 27   CEA: 04/07/16: 2.7 04/27/2016: 2.11 07/14/2016: 4.25  PATHOLOGY  Diagnosis 04/12/2016 Colon, segmental resection for tumor, Right and Terminal Ileum HIGH GRADE ADENOCARCINOMA OF THE CECUM (3 CM), GRADE 3 THE CARCINOMA INVADES MUSCULARIS PROPRIA (PT2) ALL MARGINS OF RESECTION ARE NEGATIVE FOR TUMOR METASTATIC ADENOCARCINOMA IN TWO OF SIXTEEN LYMPH NODES (2/16) UNREMARKABLE APPENDIX Microscopic Comment COLON AND RECTUM (INCLUDING TRANS-ANAL RESECTION): Specimen: Right colon and terminal ileum Procedure: Segmental resection Tumor site: Cecum Specimen integrity: Intact Macroscopic intactness of mesorectum: Not applicable: x Complete: NA Near complete: NA Incomplete: NA Cannot be determined (specify): NA Macroscopic tumor perforation: Invasion of the muscularis propria Invasive tumor: Maximum size: 3 cm Histologic type(s): Adenocarcinoma Histologic grade and differentiation: G3 G1: well differentiated/low grade G2: moderately differentiated/low grade G3: poorly differentiated/high grade G4: undifferentiated/high grade Type of polyp in which invasive carcinoma arose: Tubular adenoma 1 of 5 Supplemental copy SUPPLEMENTAL for KAYSEN, DEAL ([id number]) Microscopic Comment(continued) Microscopic extension of invasive tumor: Muscularis propria Lymph-Vascular invasion: Negative Peri-neural invasion: Negative Tumor deposit(s) (discontinuous extramural extension): Negative Resection margins: Proximal  margin: Negative Distal margin: Negative Circumferential (radial) (posterior ascending, posterior descending; lateral and posterior mid-rectum;  and entire lower 1/3 rectum):Negative Mesenteric margin (sigmoid and transverse): NA Distance closest margin (if all above margins negative): 3 cm Trans-anal resection margins only: Deep margin: NA Mucosal Margin: NA Distance closest mucosal margin (if negative): NA Treatment effect (neo-adjuvant therapy): Negative Additional polyp(s): Negative Non-neoplastic findings: Unremarkable Lymph nodes: number examined 16; number positive: 2 Pathologic Staging: pT2, pN1, Mx  MMR: (+) loss of MLH1 and PMS2  MSI-H, MLH-1 hypermethylation presents, BRAF mutation (-)  RADIOGRAPHIC STUDIES: I have personally reviewed the radiological images as listed and agreed with the findings in the report.  Renal US 08/07/2016 IMPRESSION: Bilateral nonobstructive nephrolithiasis. No hydronephrosis is noted.  CT chest, abdomen and pelvis 03/15/2016 IMPRESSION: 1. Polypoid cecal mass. No findings of liver metastatic disease or other definite metastatic disease. There are some small adjacent pericecal lymph nodes which are not pathologically enlarged. 2. Adenopathy in the chest is present but was also present in 2010, albeit slightly less prominent. This may be reactive adenopathy related to the chronic exudative right pleural effusion. 3. Prominent dilatation of the esophagus, nonspecific. Causes might include achalasia, neuropathy, scleroderma, pseudoachalasia, stricture, presbyesophagus, or Chagas disease. 4. Moderate 4 chamber cardiomegaly. 5. Liver morphology is suggestive but not absolutely diagnostic for early cirrhosis. 6.  Prominent stool throughout the colon favors constipation. 7. Left iliopsoas bursitis. 8. Possible varicoceles in the scrotum bilaterally. 9. Impingement at L4-5 due to spondylosis and degenerative disc disease.  Colonoscopy 03/04/2016  Dr. [name]  A polypoid and ulcerated non-obstructing large mass was found in the cecum. The mass was noncircumferential. The mass measured three cm in length. In addition, its diameter measured three mm. No bleeding was present. This was biopsied with a cold forceps for histology. Findings: A 2 mm polyp was found in the transverse colon. The polyp was sessile. The polyp was removed with a cold biopsy forceps. Resection and retrieval were complete. A small ascending colon polyp was identified, image #3, near the mass. This polyp was not removed as it is anticipated that it will be removed with the mass surgically. - Likely malignant tumor in the cecum. Biopsied. - One 2 mm polyp in the transverse colon, removed with a cold biopsy forceps. Resected and retrieved.  ASSESSMENT & PLAN:  66 y.o. male, with past medical history of hypertension, OSA, CHF, obesity, presented with fatigue, dyspnea on exertion, and anemia. Screening colonoscopy showed a cecal mass.  1. Right colon cancer, invasive adenocarcinoma,G3,  pT2N1M0, stage IIIA, MSI-H -I previously reviewed his CT scan findings, and surgical pathology results in great details with patient and her family members -He had a complete surgical resection -His tumor has MSI high, but ML H1 hypermethylation present, supports sporadic colon cancer, 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 -We previously discussed the risk of cancer recurrence after surgery. Due to his stage IIIA disease, he is at moderate to high risk of recurrence -We previously discussed the standard adjuvant chemotherapy for stage III colon cancer,  reduce the risk of cancer recurrence. -He has been on adjuvant FOLFOX, got sick with flu after last cycle chemo, is still recovering slowly. Lab reviewed, due to his weakness from the flu and treatment, hold chemo today.  -will give NS 583m today -F/U in 2 weeks, to see if he can take the last cycle chemo with dose reduction.   2. Iron deficient  anemia -Secondary to his colon cancer -His anemia has resolved -Continue oral iron pill  3. HTN, OSA, CHF, obesity -He will continue follow-up with his primary care physician -We previously  discussed that chemotherapy may affect his blood pressure, and we will monitor his blood pressure closely. We will be cautious about IV fluids, given his history of CHF.  4. Insomnia -Recommended Melatonin.   5. Fatigue, weight loss, anorexia, after recent flu and hospitalization  -Drinks 1 Boost a day -Drinks about 30 oz of water a day -IV fluids today.  -I have encouraged him to drink 2 Boosts per day. He can dilute it if it is too thick.   Plan -Hold 6th cycle FOLFOX today. -IV fluids today. -Return for f/u and evaluation in 2 weeks. If he is not feeling well, still, I will cancel his last cycle of treatment.    All questions were answered. The patient knows to call the clinic with any problems, questions or concerns.  I spent 20 minutes counseling the patient face to face. The total time spent in the appointment was 25 minutes and more than 50% was on counseling.  This document serves as a record of services personally performed by [name], MD. It was created on her behalf by Martinique Casey, a trained medical scribe. The creation of this record is based on the scribe's personal observations and the provider's statements to them. This document has been checked and approved by the attending provider.  I have reviewed the above documentation for accuracy and completeness, and I agree with the above information.       [name], MD 08/25/2016

## 2016-08-25 ENCOUNTER — Ambulatory Visit: Payer: Medicaid Other

## 2016-08-25 ENCOUNTER — Ambulatory Visit (HOSPITAL_BASED_OUTPATIENT_CLINIC_OR_DEPARTMENT_OTHER): Payer: Medicaid Other | Admitting: Hematology

## 2016-08-25 ENCOUNTER — Telehealth: Payer: Self-pay | Admitting: Hematology

## 2016-08-25 ENCOUNTER — Other Ambulatory Visit (HOSPITAL_BASED_OUTPATIENT_CLINIC_OR_DEPARTMENT_OTHER): Payer: Medicaid Other

## 2016-08-25 ENCOUNTER — Encounter: Payer: Self-pay | Admitting: Hematology

## 2016-08-25 ENCOUNTER — Ambulatory Visit (HOSPITAL_BASED_OUTPATIENT_CLINIC_OR_DEPARTMENT_OTHER): Payer: Medicaid Other

## 2016-08-25 VITALS — BP 114/72 | HR 76 | Temp 98.0°F | Resp 18 | Ht 70.0 in | Wt 236.9 lb

## 2016-08-25 DIAGNOSIS — E669 Obesity, unspecified: Secondary | ICD-10-CM

## 2016-08-25 DIAGNOSIS — I5032 Chronic diastolic (congestive) heart failure: Secondary | ICD-10-CM

## 2016-08-25 DIAGNOSIS — R634 Abnormal weight loss: Secondary | ICD-10-CM

## 2016-08-25 DIAGNOSIS — I10 Essential (primary) hypertension: Secondary | ICD-10-CM

## 2016-08-25 DIAGNOSIS — R5383 Other fatigue: Secondary | ICD-10-CM | POA: Diagnosis not present

## 2016-08-25 DIAGNOSIS — C182 Malignant neoplasm of ascending colon: Secondary | ICD-10-CM | POA: Diagnosis not present

## 2016-08-25 DIAGNOSIS — G4700 Insomnia, unspecified: Secondary | ICD-10-CM

## 2016-08-25 DIAGNOSIS — D509 Iron deficiency anemia, unspecified: Secondary | ICD-10-CM | POA: Diagnosis not present

## 2016-08-25 DIAGNOSIS — Z95828 Presence of other vascular implants and grafts: Secondary | ICD-10-CM

## 2016-08-25 DIAGNOSIS — I509 Heart failure, unspecified: Secondary | ICD-10-CM

## 2016-08-25 DIAGNOSIS — R630 Anorexia: Secondary | ICD-10-CM | POA: Diagnosis not present

## 2016-08-25 DIAGNOSIS — R232 Flushing: Secondary | ICD-10-CM

## 2016-08-25 DIAGNOSIS — G4733 Obstructive sleep apnea (adult) (pediatric): Secondary | ICD-10-CM

## 2016-08-25 DIAGNOSIS — C180 Malignant neoplasm of cecum: Secondary | ICD-10-CM

## 2016-08-25 LAB — CBC WITH DIFFERENTIAL/PLATELET
BASO%: 0.5 % (ref 0.0–2.0)
BASOS ABS: 0 10*3/uL (ref 0.0–0.1)
EOS ABS: 0 10*3/uL (ref 0.0–0.5)
EOS%: 0.5 % (ref 0.0–7.0)
HEMATOCRIT: 33.4 % — AB (ref 38.4–49.9)
HEMOGLOBIN: 10.8 g/dL — AB (ref 13.0–17.1)
LYMPH#: 1.2 10*3/uL (ref 0.9–3.3)
LYMPH%: 27.9 % (ref 14.0–49.0)
MCH: 26.4 pg — AB (ref 27.2–33.4)
MCHC: 32.3 g/dL (ref 32.0–36.0)
MCV: 81.7 fL (ref 79.3–98.0)
MONO#: 0.9 10*3/uL (ref 0.1–0.9)
MONO%: 21 % — ABNORMAL HIGH (ref 0.0–14.0)
NEUT#: 2.2 10*3/uL (ref 1.5–6.5)
NEUT%: 50.1 % (ref 39.0–75.0)
Platelets: 219 10*3/uL (ref 140–400)
RBC: 4.09 10*6/uL — ABNORMAL LOW (ref 4.20–5.82)
RDW: 21.9 % — AB (ref 11.0–14.6)
WBC: 4.3 10*3/uL (ref 4.0–10.3)

## 2016-08-25 LAB — COMPREHENSIVE METABOLIC PANEL
ALBUMIN: 2.4 g/dL — AB (ref 3.5–5.0)
ALT: 21 U/L (ref 0–55)
AST: 35 U/L — AB (ref 5–34)
Alkaline Phosphatase: 150 U/L (ref 40–150)
Anion Gap: 7 mEq/L (ref 3–11)
BILIRUBIN TOTAL: 0.51 mg/dL (ref 0.20–1.20)
BUN: 7.4 mg/dL (ref 7.0–26.0)
CALCIUM: 8.8 mg/dL (ref 8.4–10.4)
CO2: 28 mEq/L (ref 22–29)
CREATININE: 0.8 mg/dL (ref 0.7–1.3)
Chloride: 100 mEq/L (ref 98–109)
EGFR: >90 mL/min/{1.73_m2} (ref >90)
Glucose: 77 mg/dl (ref 70–140)
POTASSIUM: 4.3 meq/L (ref 3.5–5.1)
Sodium: 136 mEq/L (ref 136–145)
Total Protein: 6.7 g/dL (ref 6.4–8.3)

## 2016-08-25 LAB — FERRITIN: FERRITIN: 543 ng/mL — AB (ref 22–316)

## 2016-08-25 MED ORDER — HEPARIN SOD (PORK) LOCK FLUSH 100 UNIT/ML IV SOLN
500.0000 [IU] | Freq: Once | INTRAVENOUS | Status: AC
  Administered 2016-08-25: 500 [IU] via INTRAVENOUS
  Filled 2016-08-25: qty 5

## 2016-08-25 MED ORDER — SODIUM CHLORIDE 0.9% FLUSH
10.0000 mL | Freq: Once | INTRAVENOUS | Status: AC
  Administered 2016-08-25: 10 mL via INTRAVENOUS
  Filled 2016-08-25: qty 10

## 2016-08-25 MED ORDER — SODIUM CHLORIDE 0.9 % IV SOLN
INTRAVENOUS | Status: AC
  Administered 2016-08-25: 12:00:00 via INTRAVENOUS

## 2016-08-25 MED ORDER — SODIUM CHLORIDE 0.9% FLUSH
10.0000 mL | INTRAVENOUS | Status: DC | PRN
  Administered 2016-08-25: 10 mL via INTRAVENOUS
  Filled 2016-08-25: qty 10

## 2016-08-25 NOTE — Patient Instructions (Signed)

## 2016-08-25 NOTE — Telephone Encounter (Signed)
Appointments scheduled per 08/25/16 los. Patient was given a copy of the AVS report and appointment schedule per 08/25/16 los.

## 2016-08-25 NOTE — Patient Instructions (Signed)
Dehydration, Adult Dehydration is a condition in which there is not enough fluid or water in the body. This happens when you lose more fluids than you take in. Important organs, such as the kidneys, brain, and heart, cannot function without a proper amount of fluids. Any loss of fluids from the body can lead to dehydration. Dehydration can range from mild to severe. This condition should be treated right away to prevent it from becoming severe. What are the causes? This condition may be caused by:  Vomiting.  Diarrhea.  Excessive sweating, such as from heat exposure or exercise.  Not drinking enough fluid, especially:  When ill.  While doing activity that requires a lot of energy.  Excessive urination.  Fever.  Infection.  Certain medicines, such as medicines that cause the body to lose excess fluid (diuretics).  Inability to access safe drinking water.  Reduced physical ability to get adequate water and food. What increases the risk? This condition is more likely to develop in people:  Who have a poorly controlled long-term (chronic) illness, such as diabetes, heart disease, or kidney disease.  Who are age 65 or older.  Who are disabled.  Who live in a place with high altitude.  Who play endurance sports. What are the signs or symptoms? Symptoms of mild dehydration may include:   Thirst.  Dry lips.  Slightly dry mouth.  Dry, warm skin.  Dizziness. Symptoms of moderate dehydration may include:   Very dry mouth.  Muscle cramps.  Dark urine. Urine may be the color of tea.  Decreased urine production.  Decreased tear production.  Heartbeat that is irregular or faster than normal (palpitations).  Headache.  Light-headedness, especially when you stand up from a sitting position.  Fainting (syncope). Symptoms of severe dehydration may include:   Changes in skin, such as:  Cold and clammy skin.  Blotchy (mottled) or pale skin.  Skin that does  not quickly return to normal after being lightly pinched and released (poor skin turgor).  Changes in body fluids, such as:  Extreme thirst.  No tear production.  Inability to sweat when body temperature is high, such as in hot weather.  Very little urine production.  Changes in vital signs, such as:  Weak pulse.  Pulse that is more than 100 beats a minute when sitting still.  Rapid breathing.  Low blood pressure.  Other changes, such as:  Sunken eyes.  Cold hands and feet.  Confusion.  Lack of energy (lethargy).  Difficulty waking up from sleep.  Short-term weight loss.  Unconsciousness. How is this diagnosed? This condition is diagnosed based on your symptoms and a physical exam. Blood and urine tests may be done to help confirm the diagnosis. How is this treated? Treatment for this condition depends on the severity. Mild or moderate dehydration can often be treated at home. Treatment should be started right away. Do not wait until dehydration becomes severe. Severe dehydration is an emergency and it needs to be treated in a hospital. Treatment for mild dehydration may include:   Drinking more fluids.  Replacing salts and minerals in your blood (electrolytes) that you may have lost. Treatment for moderate dehydration may include:   Drinking an oral rehydration solution (ORS). This is a drink that helps you replace fluids and electrolytes (rehydrate). It can be found at pharmacies and retail stores. Treatment for severe dehydration may include:   Receiving fluids through an IV tube.  Receiving an electrolyte solution through a feeding tube that is   passed through your nose and into your stomach (nasogastric tube, or NG tube).  Correcting any abnormalities in electrolytes.  Treating the underlying cause of dehydration. Follow these instructions at home:  If directed by your health care provider, drink an ORS:  Make an ORS by following instructions on the  package.  Start by drinking small amounts, about  cup (120 mL) every 5-10 minutes.  Slowly increase how much you drink until you have taken the amount recommended by your health care provider.  Drink enough clear fluid to keep your urine clear or pale yellow. If you were told to drink an ORS, finish the ORS first, then start slowly drinking other clear fluids. Drink fluids such as:  Water. Do not drink only water. Doing that can lead to having too little salt (sodium) in the body (hyponatremia).  Ice chips.  Fruit juice that you have added water to (diluted fruit juice).  Low-calorie sports drinks.  Avoid:  Alcohol.  Drinks that contain a lot of sugar. These include high-calorie sports drinks, fruit juice that is not diluted, and soda.  Caffeine.  Foods that are greasy or contain a lot of fat or sugar.  Take over-the-counter and prescription medicines only as told by your health care provider.  Do not take sodium tablets. This can lead to having too much sodium in the body (hypernatremia).  Eat foods that contain a healthy balance of electrolytes, such as bananas, oranges, potatoes, tomatoes, and spinach.  Keep all follow-up visits as told by your health care provider. This is important. Contact a health care provider if:  You have abdominal pain that:  Gets worse.  Stays in one area (localizes).  You have a rash.  You have a stiff neck.  You are more irritable than usual.  You are sleepier or more difficult to wake up than usual.  You feel weak or dizzy.  You feel very thirsty.  You have urinated only a small amount of very dark urine over 6-8 hours. Get help right away if:  You have symptoms of severe dehydration.  You cannot drink fluids without vomiting.  Your symptoms get worse with treatment.  You have a fever.  You have a severe headache.  You have vomiting or diarrhea that:  Gets worse.  Does not go away.  You have blood or green matter  (bile) in your vomit.  You have blood in your stool. This may cause stool to look black and tarry.  You have not urinated in 6-8 hours.  You faint.  Your heart rate while sitting still is over 100 beats a minute.  You have trouble breathing. This information is not intended to replace advice given to you by your health care provider. Make sure you discuss any questions you have with your health care provider. Document Released: 06/20/2005 Document Revised: 01/15/2016 Document Reviewed: 08/14/2015 [employer] Interactive Patient Education  2017 [employer]  

## 2016-08-29 ENCOUNTER — Telehealth: Payer: Self-pay | Admitting: Internal Medicine

## 2016-08-29 DIAGNOSIS — M25469 Effusion, unspecified knee: Secondary | ICD-10-CM

## 2016-08-29 DIAGNOSIS — M25473 Effusion, unspecified ankle: Secondary | ICD-10-CM

## 2016-08-29 DIAGNOSIS — M25579 Pain in unspecified ankle and joints of unspecified foot: Secondary | ICD-10-CM

## 2016-08-29 DIAGNOSIS — M25569 Pain in unspecified knee: Secondary | ICD-10-CM

## 2016-08-29 NOTE — Telephone Encounter (Signed)
New Message     His ankles and knees are swollen and needs you to send referral over to [facility] please call

## 2016-08-29 NOTE — Telephone Encounter (Signed)
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 Pt states that this is hurting really bad again and needs another referral to take care of this again

## 2016-08-29 NOTE — Telephone Encounter (Signed)
Still waiting for dr [name]

## 2016-08-29 NOTE — Telephone Encounter (Signed)
Follow up      Pt is calling to see if we have scheduled an appt for him to see the orthopedic.  Please call.  He is waiting by the phone for your call.

## 2016-08-30 NOTE — Telephone Encounter (Signed)
I just received the message today Eliezer Lofts, please refer to [facility] orthopedics. Whatever doctor they feel is appropriate for knee and ankle pain/swelling.  Dr. Lemmie Evens

## 2016-08-30 NOTE — Telephone Encounter (Signed)
LM for patient that referral has been ordered in [facility].  Message sent to scheduler to assist w/referral appt

## 2016-09-02 ENCOUNTER — Ambulatory Visit (INDEPENDENT_AMBULATORY_CARE_PROVIDER_SITE_OTHER): Payer: Medicaid Other | Admitting: Pharmacist Clinician (PhC)/ Clinical Pharmacy Specialist

## 2016-09-02 DIAGNOSIS — Z7901 Long term (current) use of anticoagulants: Secondary | ICD-10-CM

## 2016-09-02 DIAGNOSIS — I482 Chronic atrial fibrillation: Secondary | ICD-10-CM | POA: Diagnosis not present

## 2016-09-02 DIAGNOSIS — I4821 Permanent atrial fibrillation: Secondary | ICD-10-CM

## 2016-09-02 LAB — POCT INR: INR: 4.9

## 2016-09-08 NOTE — Progress Notes (Signed)
[place]  Telephone:([phone]) [phone] Fax:([phone]) [phone]  Clinic Follow up Note   Patient Care Team: Elisabeth Cara, PA-C as PCP - General (Family Medicine) [name], MD as Consulting Physician (General Surgery) Tania Ade, RN as Registered Nurse 09/09/2016   CHIEF COMPLAINTS:  Follow up stage III colon cancer  Oncology History   Cancer of right colon [facility])   Staging form: Colon and Rectum, AJCC 7th Edition   - Clinical stage from 04/12/2016: Stage IIIA (T2, N1, M0) - Signed by [name], MD on 05/01/2016      Cancer of right colon ([place])   03/04/2016 Procedure    COLONOSCOPY: A polypoid and ulcerated non-obstructing large mass was found in the cecum. The mass was noncircumferential. The mass measured three cm in length. In addition, its diameter measured three mm. (Dr. [name])       03/15/2016 Imaging    1. Polypoid cecal mass. No findings of liver metastatic disease or other definite metastatic disease. There are some small adjacent pericecal lymph nodes which are not pathologically enlarged. 2. Adenopathy in the chest is present but was also present in 2010, albeit slightly less prominent. This may be reactive adenopathy related to the chronic exudative right pleural effusion.       04/07/2016 Tumor Marker    Patient's tumor was tested for the following markers: CEA. Results of the tumor marker test revealed 2.7.      04/12/2016 Initial Diagnosis    Cecal cancer ([place])     04/12/2016 Definitive Surgery    Laparoscopic right hemicolectomy (Dr. [name])      04/12/2016 Pathologic Stage    pT2 pN1 pMX--Grade 3 adenocarcinoma with 2/16 nodes positive, clear margins, negative for perineural or lymphvascular invasion; invades muscularis propria Loss of expression MLH1/PMS2      06/02/2016 -  Adjuvant Chemotherapy     Ajuvant chemotherapy FOLFOX, every 2 weeks, plan for 3 months       06/29/2016 Genetic Testing    POLE [id number]  identified on the Colorectal cancer panel.  Negative genetic testing for the MSH2 inversion analysis ([place] inversion). The Colorectal Cancer Panel offered by [facility] includes sequencing and/or duplication/deletion testing of the following 19 genes: APC, ATM, AXIN2, BMPR1A, CDH1, CHEK2, EPCAM, MLH1, MSH2, MSH6, MUTYH, PMS2, POLD1, POLE, PTEN, SCG5/GREM1, SMAD4, STK11, and TP53. The report date is 06/22/2016 for the [facility] panel and 06/29/2016 for the [place] inversion.      08/17/2016 - 08/20/2016 Hospital Admission    Admit date: 08/17/2016 Admission diagnosis: Acute respiratory failure with hypoxia  Additional comments: Patient was admitted initially with hypoxia and found to have H influenza positive-started on Tamiflu and broad-spectrum antibiotics were D escalate. He was hypotensive on admission given IV saline 75 cc per hour saline lock 2/16 and antihypertensives which were held including lisinopril and Lasix and hydralazine on discharge. Joint hospitalization he continued on his metoprolol twice a day       HISTORY OF PRESENTING ILLNESS (04/27/2016):  Steven Barnett 66 y.o. male is here because of his recently diagnosed stage III colon cancer. She is accompanied by her wife to my clinic today.  This was discovered by screening colonoscopy in 03/2016. At the time of his screening colonoscopy, he was found to have anemia and iron deficiency. He has had mild to moderate fatigue and dyspnea on exertion for several months. Colonoscopy showed a large no obstructive mass in the cecum, biopsy showed adenocarcinoma. He was referred to see surgeon Dr.  Byerly and underwent right hemicolectomy on 04/12/2016. He has been recovering slowly from surgery, still has mild to moderate pain at the incision, his energy level and appetite has not recovered well.   He was hospitalized for CHF in 09/2015 and was found to have pleural effusion, he is on lasix. He is able to do all his ADLs, and light activities, such as house  work. He is not very physically active.    CURRENT THERAPY: Ajuvant chemotherapy FOLFOX, every 2 weeks, started 06/02/2016  INTERIM HISTORY:  [name] returns for follow-up and his final cycle chemo. His right lateral upper leg is painful, and he reports it bothers him when he is trying to sleep. He is using Aspercreme with some relief. He reports normalized bowel movements. He reports some numbness and tingling in his fingertips, but it is mild and does not affect his ability to pick up small objects.   MEDICAL HISTORY:  Past Medical History:  Diagnosis Date  . Anemia   . Arthralgia of both knees   . Arthritis    "right knee" (10/01/2015)  . Atrial fibrillation ([place])   . Cancer (HCC)    STAGE 1 COLON CANCER  . Chronic anticoagulation 2010   Coumadin  . Chronic diastolic CHF (congestive heart failure), NYHA class 2 ([place])   . Hypertension   . OSA (obstructive sleep apnea) 2016   "couldn't take the mask during the testing" (10/01/2015)  . Permanent atrial fibrillation ([place]) 2010  . Pneumonia 3/29/2017and june 2017    SURGICAL HISTORY: Past Surgical History:  Procedure Laterality Date  . COLONOSCOPY WITH PROPOFOL N/A 03/04/2016   Procedure: COLONOSCOPY WITH PROPOFOL;  Surgeon: Carol Ada, MD;  Location: [facility];  Service: Endoscopy;  Laterality: N/A;  . LAPAROSCOPIC PARTIAL COLECTOMY N/A 04/12/2016   Procedure: LAPAROSCOPIC ILEOCOLECTOMY;  Surgeon: [name], MD;  Location: [place];  Service: General;  Laterality: N/A;  . PORTACATH PLACEMENT N/A 05/18/2016   Procedure: INSERTION PORT-A-CATH;  Surgeon: [name], MD;  Location: [place];  Service: General;  Laterality: N/A;  . THORACOTOMY Right 2010    SOCIAL HISTORY: Social History   Social History  . Marital status: Married    Spouse name: Ronnette Juniper  . Number of children: N/A  . Years of education: N/A   Occupational History  . Not on file.   Social History Main Topics  . Smoking status: Former  Smoker    Packs/day: 0.10    Years: 2.00    Types: Cigarettes    Quit date: 12/16/1974  . Smokeless tobacco: Never Used     Comment: "1 pack cigarettes would last me a month"  . Alcohol use 0.0 oz/week     Comment: 10/01/2015 "might have1 6 pack of beer/year"  . Drug use: Yes    Types: Cocaine, Marijuana     Comment: Hx polysubstance abuse, quit 2008  . Sexual activity: Not Currently   Other Topics Concern  . 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   FAMILY HISTORY: Family History  Problem Relation Age of Onset  . Heart disease Father   . Heart failure Father   . Colon polyps Father     unspecified number - pt of intestine surgically resected  . Hypertension Mother   . Diabetes Mother   . Hyperlipidemia Mother   . Colon polyps Mother     unspecified number -  pt of intestine surgically resected  . Dementia Mother     d. 8  . Stroke Maternal Grandmother   . Stroke Sister   . Colon cancer Sister 62    w/ "cancerous polyps" - unspecified number; underwent surgery and radiation  . Colon polyps Brother     unspecified number - polypectomies  . Colon cancer Sister     dx 61-60; s/p surgery and radiation  . Bone cancer Maternal Uncle     dx. 57s  . Lung cancer Paternal Grandfather     d. 80s-90s; lung cancer vs TB  . Colon cancer Sister     dx. 41-60; s/p surgery and radiation  . Cancer Maternal Uncle     mother had about 7-8 other siblings, most passed at older ages, many of whom had some type of cancer  . Heart attack Neg Hx     ALLERGIES:  is allergic to no known allergies.  MEDICATIONS:  Current Outpatient Prescriptions  Medication Sig Dispense Refill  . acetaminophen (TYLENOL) 500 MG tablet Take 1,000 mg by mouth 2 (two) times daily.    Marland Kitchen albuterol (PROVENTIL HFA;VENTOLIN HFA) 108 (90 Base) MCG/ACT inhaler Inhale 2 puffs into the lungs every 6 (six) hours as needed for wheezing  or shortness of breath. 1 Inhaler 2  . aspirin EC 81 MG tablet Take 81 mg by mouth daily.    . ferrous sulfate 325 (65 FE) MG tablet Take 1 tablet (325 mg total) by mouth 2 (two) times daily with a meal. 60 tablet 3  . furosemide (LASIX) 80 MG tablet Take 1 tablet (80 mg total) by mouth daily.    . hydrALAZINE (APRESOLINE) 50 MG tablet TAKE ONE TABLET BY MOUTH TWICE DAILY 60 tablet 6  . ipratropium (ATROVENT HFA) 17 MCG/ACT inhaler Inhale 2 puffs into the lungs every 6 (six) hours as needed for wheezing.     . lidocaine-prilocaine (EMLA) cream Apply to affected area once (Patient taking differently: Apply 1 application topically once. Apply to affected area once) 30 g 3  . lisinopril (PRINIVIL,ZESTRIL) 20 MG tablet TAKE ONE TABLET BY MOUTH ONCE DAILY 30 tablet 10  . metoprolol tartrate (LOPRESSOR) 25 MG tablet TAKE ONE TABLET BY MOUTH TWICE DAILY 30 tablet 6  . potassium chloride SA (K-DUR,KLOR-CON) 20 MEQ tablet Take 2 tablets (40 mEq total) by mouth daily. 180 tablet 3  . warfarin (COUMADIN) 4 MG tablet Take 4 mg by mouth daily. Monday- 1 tab Tuesday, wednesday- 2 tabs Thursday- 1 tab Friday, Saturday, Sunday- no coumadin    . docusate sodium (COLACE) 100 MG capsule Take 1 capsule (100 mg total) by mouth 2 (two) times daily. For constipation (Patient not taking: Reported on 08/25/2016) 60 capsule 3  . sildenafil (REVATIO) 20 MG tablet TAKE TWO TO THREE TABLETS BY MOUTH ONCE DAILY AS NEEDED FOR SEXUAL ACTIVITY (Patient not taking: Reported on 08/25/2016) 30 tablet 2   No current facility-administered medications for this visit.     REVIEW OF SYSTEMS: Constitutional: Denies fevers, chills or abnormal night sweats, (+) fatigue, weakness, loss of appetite, weight loss Eyes: Denies double vision or watery eyes  Ears, nose, mouth, throat, and face: Denies mucositis or sore throat Respiratory: Denies cough, dyspnea or wheezes Cardiovascular: Denies palpitation, chest discomfort or lower extremity  swelling, Gastrointestinal:  Denies nausea, diarrhea, heartburn Skin: Denies abnormal skin rashes Lymphatics: Denies new lymphadenopathy or easy bruising Musculoskeletal: (+) pain to right lateral upper leg Neurological:(+) mild tingling in fingertips, (+) mild numbness  Behavioral/Psych: Mood is stable, no new changes  All other systems were reviewed with the patient and are negative.  PHYSICAL EXAMINATION: ECOG PERFORMANCE STATUS: 1  Vitals:   09/09/16 0927  BP: 128/80  Pulse: 66  Resp: 18  Temp: 97.8 F (36.6 C)   Filed Weights   09/09/16 0927  Weight: 249 lb 12.8 oz (113.3 kg)    GENERAL:alert, no distress and comfortable SKIN: skin color, texture, turgor are normal, no rashes or significant lesions EYES: normal, conjunctiva are pink and non-injected, sclera clear OROPHARYNX:no exudate, no erythema and lips, buccal mucosa, and tongue normal  NECK: supple, thyroid normal size, non-tender, without nodularity LYMPH:  no palpable lymphadenopathy in the cervical, axillary or inguinal LUNGS: clear to auscultation and percussion with normal breathing effort HEART: regular rate & rhythm and no murmurs and no lower extremity edema ABDOMEN:abdomen soft, non-tender and normal bowel sounds. (+) midline incision appears healing well, no discharge Musculoskeletal:no cyanosis of digits and no clubbing  PSYCH: alert & oriented x 3 with fluent speech NEURO: no focal motor/sensory deficits  LABORATORY DATA:  I have reviewed the data as listed CBC Latest Ref Rng & Units 09/09/2016 08/25/2016 08/19/2016  WBC 4.0 - 10.3 10e3/uL 5.9 4.3 5.3  Hemoglobin 13.0 - 17.1 g/dL 10.4(L) 10.8(L) 9.6(L)  Hematocrit 38.4 - 49.9 % 32.9(L) 33.4(L) 29.6(L)  Platelets 140 - 400 10e3/uL 215 219 124(L)   CMP Latest Ref Rng & Units 09/09/2016 08/25/2016 08/20/2016  Glucose 70 - 140 mg/dl 78 77 69  BUN 7.0 - 26.0 mg/dL 11.4 7.4 8  Creatinine 0.7 - 1.3 mg/dL 0.8 0.8 0.73  Sodium 136 - 145 mEq/L 135(L) 136 134(L)    Potassium 3.5 - 5.1 mEq/L 4.6 4.3 4.2  Chloride 101 - 111 mmol/L - - 103  CO2 22 - 29 mEq/L _0 Calcium 8.4 - 10.4 mg/dL 8.8 8.8 7.4(L)  Total Protein 6.4 - 8.3 g/dL 7.0 6.7 -  Total Bilirubin 0.20 - 1.20 mg/dL 0.59 0.51 -  Alkaline Phos 40 - 150 U/L 181(H) 150 -  AST 5 - 34 U/L 16 35(H) -  ALT 0 - 55 U/L 9 21 -   CEA: 04/07/16: 2.7 04/27/2016: 2.11 07/14/2016: 4.25  PATHOLOGY  Diagnosis 04/12/2016 Colon, segmental resection for tumor, Right and Terminal Ileum HIGH GRADE ADENOCARCINOMA OF THE CECUM (3 CM), GRADE 3 THE CARCINOMA INVADES MUSCULARIS PROPRIA (PT2) ALL MARGINS OF RESECTION ARE NEGATIVE FOR TUMOR METASTATIC ADENOCARCINOMA IN TWO OF SIXTEEN LYMPH NODES (2/16) UNREMARKABLE APPENDIX   MMR: (+) loss of MLH1 and PMS2  MSI-H, MLH-1 hypermethylation presents, BRAF mutation (-)  RADIOGRAPHIC STUDIES: I have personally reviewed the radiological images as listed and agreed with the findings in the report.  Renal US 08/07/2016 IMPRESSION: Bilateral nonobstructive nephrolithiasis. No hydronephrosis is noted.  CT chest, abdomen and pelvis 03/15/2016 IMPRESSION: 1. Polypoid cecal mass. No findings of liver metastatic disease or other definite metastatic disease. There are some small adjacent pericecal lymph nodes which are not pathologically enlarged. 2. Adenopathy in the chest is present but was also present in 2010, albeit slightly less prominent. This may be reactive adenopathy related to the chronic exudative right pleural effusion. 3. Prominent dilatation of the esophagus, nonspecific. Causes might include achalasia, neuropathy, scleroderma, pseudoachalasia, stricture, presbyesophagus, or Chagas disease. 4. Moderate 4 chamber cardiomegaly. 5. Liver morphology is suggestive but not absolutely diagnostic for early cirrhosis. 6.  Prominent stool throughout the colon favors constipation. 7. Left iliopsoas bursitis. 8. Possible varicoceles in the scrotum  bilaterally. 9. Impingement at L4-5 due to spondylosis and degenerative disc disease.  Colonoscopy 03/04/2016 Dr. [name]  A polypoid and ulcerated non-obstructing large mass was found in the cecum. The mass was noncircumferential. The mass measured three cm in length. In addition, its diameter measured three mm. No bleeding was present. This was biopsied with a cold forceps for histology. Findings: A 2 mm polyp was found in the transverse colon. The polyp was sessile. The polyp was removed with a cold biopsy forceps. Resection and retrieval were complete. A small ascending colon polyp was identified, image #3, near the mass. This polyp was not removed as it is anticipated that it will be removed with the mass surgically. - Likely malignant tumor in the cecum. Biopsied. - One 2 mm polyp in the transverse colon, removed with a cold biopsy forceps. Resected and retrieved.  ASSESSMENT & PLAN:  66 y.o. male, with past medical history of hypertension, OSA, CHF, obesity, presented with fatigue, dyspnea on exertion, and anemia. Screening colonoscopy showed a cecal mass.  1. Right colon cancer, invasive adenocarcinoma,G3,  pT2N1M0, stage IIIA, MSI-H -I previously reviewed his CT scan findings, and surgical pathology results in great details with patient and her family members -He had a complete surgical resection -His tumor has MSI high, but ML H1 hypermethylation present, supports sporadic colon cancer, 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 -We previously discussed the risk of cancer recurrence after surgery. Due to his stage IIIA disease, he is at moderate to high risk of recurrence -We previously discussed the standard adjuvant chemotherapy for stage III colon cancer,  reduce the risk of cancer recurrence. -He has been on adjuvant FOLFOX, got sick with flu after last cycle chemo, is now recovered.  -I recommend the patient proceed with final cycle chemo. Due to his peripheral neuropathy and celiac medications  from last cycle chemotherapy, I'll hold oxaliplatin for the last cycle chemotherapy, and give 5-FU only, will do it early next week. The patient is in agreement. --I discussed the risk of cancer recurrence in the future. I discussed the surveillance plan, which is a physical exam and lab test (including CBC, CMP and CEA) every 3 months for the first 2 years, then every 6-12 months, colonoscopy in one year, and surveilliance CT scan every 6-12 month for up to 5 year.   2. Iron deficient anemia -Secondary to his colon cancer -His anemia has resolved -Continue oral iron pill  3. HTN, OSA, CHF, obesity -He will continue follow-up with his primary care physician -We previously discussed that chemotherapy may affect his blood pressure, and we will monitor his blood pressure closely. We will be cautious about IV fluids, given his history of CHF.  4. Insomnia -Previously recommended Melatonin.   5. Fatigue, weight loss, anorexia, after recent flu and hospitalization  -he has completed recovered now   Plan -last cycle chemo with levocovorin and 5-FU infusion early next week. -F/u in 4 weeks -Order scan and colonoscopy at next visit.   All questions were answered. The patient knows to call the clinic with any problems, questions or concerns.  I spent 20 minutes counseling the patient face to face. The total time spent in the appointment was 25 minutes and more than 50% was on counseling.  This document serves as a record of services personally performed by [name], MD. It was created on her behalf by Maryla Morrow, a trained medical scribe. The creation of this record is based on the scribe's personal observations and the provider's statements to  them. This document has been checked and approved by the attending provider.  I have reviewed the above documentation for accuracy and completeness, and I agree with the above information.       [name], MD 09/09/2016

## 2016-09-09 ENCOUNTER — Encounter: Payer: Self-pay | Admitting: Hematology

## 2016-09-09 ENCOUNTER — Other Ambulatory Visit (HOSPITAL_BASED_OUTPATIENT_CLINIC_OR_DEPARTMENT_OTHER): Payer: Medicaid Other

## 2016-09-09 ENCOUNTER — Telehealth: Payer: Self-pay | Admitting: Hematology

## 2016-09-09 ENCOUNTER — Ambulatory Visit (HOSPITAL_BASED_OUTPATIENT_CLINIC_OR_DEPARTMENT_OTHER): Payer: Medicaid Other | Admitting: Hematology

## 2016-09-09 ENCOUNTER — Ambulatory Visit (INDEPENDENT_AMBULATORY_CARE_PROVIDER_SITE_OTHER): Payer: Medicaid Other | Admitting: Pharmacist Clinician (PhC)/ Clinical Pharmacy Specialist

## 2016-09-09 VITALS — BP 128/80 | HR 66 | Temp 97.8°F | Resp 18 | Ht 70.0 in | Wt 249.8 lb

## 2016-09-09 DIAGNOSIS — C182 Malignant neoplasm of ascending colon: Secondary | ICD-10-CM

## 2016-09-09 DIAGNOSIS — I482 Chronic atrial fibrillation: Secondary | ICD-10-CM

## 2016-09-09 DIAGNOSIS — I4821 Permanent atrial fibrillation: Secondary | ICD-10-CM

## 2016-09-09 DIAGNOSIS — G4700 Insomnia, unspecified: Secondary | ICD-10-CM

## 2016-09-09 DIAGNOSIS — Z7901 Long term (current) use of anticoagulants: Secondary | ICD-10-CM | POA: Diagnosis not present

## 2016-09-09 DIAGNOSIS — I5032 Chronic diastolic (congestive) heart failure: Secondary | ICD-10-CM

## 2016-09-09 DIAGNOSIS — G4733 Obstructive sleep apnea (adult) (pediatric): Secondary | ICD-10-CM

## 2016-09-09 DIAGNOSIS — C180 Malignant neoplasm of cecum: Secondary | ICD-10-CM

## 2016-09-09 DIAGNOSIS — I10 Essential (primary) hypertension: Secondary | ICD-10-CM | POA: Diagnosis not present

## 2016-09-09 LAB — COMPREHENSIVE METABOLIC PANEL
ALT: 9 U/L (ref 0–55)
AST: 16 U/L (ref 5–34)
Albumin: 2.7 g/dL — ABNORMAL LOW (ref 3.5–5.0)
Alkaline Phosphatase: 181 U/L — ABNORMAL HIGH (ref 40–150)
Anion Gap: 7 mEq/L (ref 3–11)
BILIRUBIN TOTAL: 0.59 mg/dL (ref 0.20–1.20)
BUN: 11.4 mg/dL (ref 7.0–26.0)
CHLORIDE: 101 meq/L (ref 98–109)
CO2: 27 meq/L (ref 22–29)
Calcium: 8.8 mg/dL (ref 8.4–10.4)
Creatinine: 0.8 mg/dL (ref 0.7–1.3)
EGFR: >90 mL/min/{1.73_m2} (ref >90)
GLUCOSE: 78 mg/dL (ref 70–140)
POTASSIUM: 4.6 meq/L (ref 3.5–5.1)
SODIUM: 135 meq/L — AB (ref 136–145)
TOTAL PROTEIN: 7 g/dL (ref 6.4–8.3)

## 2016-09-09 LAB — CBC WITH DIFFERENTIAL/PLATELET
BASO%: 1.2 % (ref 0.0–2.0)
Basophils Absolute: 0.1 10*3/uL (ref 0.0–0.1)
EOS%: 2.6 % (ref 0.0–7.0)
Eosinophils Absolute: 0.2 10*3/uL (ref 0.0–0.5)
HCT: 32.9 % — ABNORMAL LOW (ref 38.4–49.9)
HGB: 10.4 g/dL — ABNORMAL LOW (ref 13.0–17.1)
LYMPH%: 19.8 % (ref 14.0–49.0)
MCH: 26.6 pg — ABNORMAL LOW (ref 27.2–33.4)
MCHC: 31.6 g/dL — AB (ref 32.0–36.0)
MCV: 84.1 fL (ref 79.3–98.0)
MONO#: 0.8 10*3/uL (ref 0.1–0.9)
MONO%: 14.1 % — AB (ref 0.0–14.0)
NEUT#: 3.7 10*3/uL (ref 1.5–6.5)
NEUT%: 62.3 % (ref 39.0–75.0)
Platelets: 215 10*3/uL (ref 140–400)
RBC: 3.91 10*6/uL — AB (ref 4.20–5.82)
RDW: 21.2 % — AB (ref 11.0–14.6)
WBC: 5.9 10*3/uL (ref 4.0–10.3)
lymph#: 1.2 10*3/uL (ref 0.9–3.3)
nRBC: 1 % — ABNORMAL HIGH (ref 0–0)

## 2016-09-09 LAB — POCT INR: INR: 2.1

## 2016-09-09 NOTE — Telephone Encounter (Signed)
Appointments scheduled per 3/9 LOS. Patient given AVS report and calendars with future scheduled appointments. °

## 2016-09-13 ENCOUNTER — Ambulatory Visit (HOSPITAL_BASED_OUTPATIENT_CLINIC_OR_DEPARTMENT_OTHER): Payer: Medicaid Other

## 2016-09-13 VITALS — BP 110/62 | HR 52 | Temp 97.7°F | Resp 16

## 2016-09-13 DIAGNOSIS — Z5111 Encounter for antineoplastic chemotherapy: Secondary | ICD-10-CM | POA: Diagnosis present

## 2016-09-13 DIAGNOSIS — C182 Malignant neoplasm of ascending colon: Secondary | ICD-10-CM

## 2016-09-13 MED ORDER — FLUOROURACIL CHEMO INJECTION 2.5 GM/50ML
400.0000 mg/m2 | Freq: Once | INTRAVENOUS | Status: AC
  Administered 2016-09-13: 950 mg via INTRAVENOUS
  Filled 2016-09-13: qty 19

## 2016-09-13 MED ORDER — SODIUM CHLORIDE 0.9% FLUSH
10.0000 mL | INTRAVENOUS | Status: DC | PRN
  Filled 2016-09-13: qty 10

## 2016-09-13 MED ORDER — SODIUM CHLORIDE 0.9 % IV SOLN
INTRAVENOUS | Status: DC
  Administered 2016-09-13: 14:00:00 via INTRAVENOUS

## 2016-09-13 MED ORDER — DEXAMETHASONE SODIUM PHOSPHATE 10 MG/ML IJ SOLN
10.0000 mg | Freq: Once | INTRAMUSCULAR | Status: AC
  Administered 2016-09-13: 10 mg via INTRAVENOUS

## 2016-09-13 MED ORDER — DEXAMETHASONE SODIUM PHOSPHATE 10 MG/ML IJ SOLN
INTRAMUSCULAR | Status: AC
  Filled 2016-09-13: qty 1

## 2016-09-13 MED ORDER — DEXTROSE 5 % IV SOLN: Freq: Once | INTRAVENOUS | Status: DC

## 2016-09-13 MED ORDER — PROCHLORPERAZINE MALEATE 10 MG PO TABS
10.0000 mg | ORAL_TABLET | Freq: Four times a day (QID) | ORAL | Status: DC | PRN
  Administered 2016-09-13: 10 mg via ORAL

## 2016-09-13 MED ORDER — PROCHLORPERAZINE MALEATE 10 MG PO TABS
ORAL_TABLET | ORAL | Status: AC
  Filled 2016-09-13: qty 1

## 2016-09-13 MED ORDER — LEUCOVORIN CALCIUM INJECTION 350 MG
400.0000 mg/m2 | Freq: Once | INTRAVENOUS | Status: AC
  Administered 2016-09-13: 948 mg via INTRAVENOUS
  Filled 2016-09-13: qty 47.4

## 2016-09-13 MED ORDER — SODIUM CHLORIDE 0.9 % IV SOLN
2400.0000 mg/m2 | INTRAVENOUS | Status: DC
  Administered 2016-09-13: 5700 mg via INTRAVENOUS
  Filled 2016-09-13: qty 114

## 2016-09-13 MED ORDER — HEPARIN SOD (PORK) LOCK FLUSH 100 UNIT/ML IV SOLN
500.0000 [IU] | Freq: Once | INTRAVENOUS | Status: DC | PRN
  Filled 2016-09-13: qty 5

## 2016-09-15 ENCOUNTER — Ambulatory Visit (HOSPITAL_BASED_OUTPATIENT_CLINIC_OR_DEPARTMENT_OTHER): Payer: Medicaid Other

## 2016-09-15 VITALS — BP 120/67 | HR 80 | Temp 97.7°F | Resp 18

## 2016-09-15 DIAGNOSIS — Z452 Encounter for adjustment and management of vascular access device: Secondary | ICD-10-CM | POA: Diagnosis present

## 2016-09-15 DIAGNOSIS — C182 Malignant neoplasm of ascending colon: Secondary | ICD-10-CM | POA: Diagnosis not present

## 2016-09-15 MED ORDER — SODIUM CHLORIDE 0.9% FLUSH
10.0000 mL | INTRAVENOUS | Status: DC | PRN
  Administered 2016-09-15: 10 mL
  Filled 2016-09-15: qty 10

## 2016-09-15 MED ORDER — HEPARIN SOD (PORK) LOCK FLUSH 100 UNIT/ML IV SOLN
500.0000 [IU] | Freq: Once | INTRAVENOUS | Status: AC | PRN
  Administered 2016-09-15: 500 [IU]
  Filled 2016-09-15: qty 5

## 2016-09-15 NOTE — Patient Instructions (Signed)

## 2016-09-30 ENCOUNTER — Ambulatory Visit (INDEPENDENT_AMBULATORY_CARE_PROVIDER_SITE_OTHER): Payer: Medicaid Other | Admitting: Pharmacist Clinician (PhC)/ Clinical Pharmacy Specialist

## 2016-09-30 DIAGNOSIS — Z7901 Long term (current) use of anticoagulants: Secondary | ICD-10-CM | POA: Diagnosis not present

## 2016-09-30 DIAGNOSIS — I4821 Permanent atrial fibrillation: Secondary | ICD-10-CM

## 2016-09-30 DIAGNOSIS — I482 Chronic atrial fibrillation: Secondary | ICD-10-CM

## 2016-09-30 LAB — POCT INR: INR: 1.7

## 2016-10-06 ENCOUNTER — Other Ambulatory Visit: Payer: Medicaid Other

## 2016-10-06 ENCOUNTER — Ambulatory Visit: Payer: Medicaid Other | Admitting: Hematology

## 2016-10-10 ENCOUNTER — Telehealth: Payer: Self-pay | Admitting: Internal Medicine

## 2016-10-10 NOTE — Telephone Encounter (Signed)
Faxed signed orders to [facility] (Attn: Kara Pacer RN, BSN) for tele-monitoring for initial period of 90 days.   Parameters: DBP 50-90 SBP 90-160 Weight 230 - 240lbs SpO2 > 92% Weight gain of >2lbs in 24 hours or >4lbs in 7 days  Diagnosis of: diastolic CHF, HTN  Fax results biweekly

## 2016-10-17 NOTE — Progress Notes (Signed)
[facility]  [phone] [phone]  Clinic Follow up Note   Patient Care Team: Elisabeth Cara, PA-C as PCP - General (Family Medicine) [name], MD as Consulting Physician (General Surgery) Tania Ade, RN as Registered Nurse 10/21/2016   CHIEF COMPLAINTS:  Follow up stage III colon cancer  Oncology History   Cancer of right colon [facility])   Staging form: Colon and Rectum, AJCC 7th Edition   - Clinical stage from 04/12/2016: Stage IIIA (T2, N1, M0) - Signed by [name], MD on 05/01/2016      Cancer of right colon ([place])   03/04/2016 Procedure    COLONOSCOPY: A polypoid and ulcerated non-obstructing large mass was found in the cecum. The mass was noncircumferential. The mass measured three cm in length. In addition, its diameter measured three mm. (Dr. [name])       03/15/2016 Imaging    1. Polypoid cecal mass. No findings of liver metastatic disease or other definite metastatic disease. There are some small adjacent pericecal lymph nodes which are not pathologically enlarged. 2. Adenopathy in the chest is present but was also present in 2010, albeit slightly less prominent. This may be reactive adenopathy related to the chronic exudative right pleural effusion.       04/07/2016 Tumor Marker    Patient's tumor was tested for the following markers: CEA. Results of the tumor marker test revealed 2.7.      04/12/2016 Initial Diagnosis    Cecal cancer ([place])     04/12/2016 Definitive Surgery    Laparoscopic right hemicolectomy (Dr. [name])      04/12/2016 Pathologic Stage    pT2 pN1 pMX--Grade 3 adenocarcinoma with 2/16 nodes positive, clear margins, negative for perineural or lymphvascular invasion; invades muscularis propria Loss of expression MLH1/PMS2      06/02/2016 - 09/13/2016 Adjuvant Chemotherapy     Ajuvant chemotherapy FOLFOX, every 2 weeks for 6 cycles (3 months), last cycle postponed due to hospitalization         06/29/2016 Genetic Testing    POLE [id number] VUS identified on the Colorectal cancer panel.  Negative genetic testing for the MSH2 inversion analysis ([place] inversion). The Colorectal Cancer Panel offered by [facility] includes sequencing and/or duplication/deletion testing of the following 19 genes: APC, ATM, AXIN2, BMPR1A, CDH1, CHEK2, EPCAM, MLH1, MSH2, MSH6, MUTYH, PMS2, POLD1, POLE, PTEN, SCG5/GREM1, SMAD4, STK11, and TP53. The report date is 06/22/2016 for the [facility] panel and 06/29/2016 for the [place] inversion.      08/17/2016 - 08/20/2016 Hospital Admission    Admit date: 08/17/2016 Admission diagnosis: Acute respiratory failure with hypoxia  Additional comments: Patient was admitted initially with hypoxia and found to have H influenza positive-started on Tamiflu and broad-spectrum antibiotics were D escalate. He was hypotensive on admission given IV saline 75 cc per hour saline lock 2/16 and antihypertensives which were held including lisinopril and Lasix and hydralazine on discharge. Joint hospitalization he continued on his metoprolol twice a day       HISTORY OF PRESENTING ILLNESS (04/27/2016):  Steven Barnett 66 y.o. male is here because of his recently diagnosed stage III colon cancer. She is accompanied by her wife to my clinic today.  This was discovered by screening colonoscopy in 03/2016. At the time of his screening colonoscopy, he was found to have anemia and iron deficiency. He has had mild to moderate fatigue and dyspnea on exertion for several months. Colonoscopy showed a large no obstructive mass in the cecum, biopsy showed  adenocarcinoma. He was referred to see surgeon Dr. Donell Beers and underwent right hemicolectomy on 04/12/2016. He has been recovering slowly from surgery, still has mild to moderate pain at the incision, his energy level and appetite has not recovered well.   He was hospitalized for CHF in 09/2015 and was found to have pleural effusion, he is on lasix. He is able to  do all his ADLs, and light activities, such as house work. He is not very physically active.    CURRENT THERAPY: Surveillance   INTERIM HISTORY:  Mr. Knack returns for follow-up. He has been feeling better since coming off of chemotherapy. He reports numbness/tingling still present in the tips of the fingers however, this does not affect his ability to do things. He denies tingling or numbness in the feet. He is able to be more active since finishing chemotherapy. He has gained weight and his taste has improved. He reports pain and soreness to right lateral upper leg. The pain improves with asper cream. He denies any change in his medication. He follows with his PCP for his coumadin. He ran out of iron pills and has not been taking them for about one week.   MEDICAL HISTORY:  Past Medical History:  Diagnosis Date  . Anemia   . Arthralgia of both knees   . Arthritis    "right knee" (10/01/2015)  . Atrial fibrillation (HCC)   . Cancer (HCC)    STAGE 1 COLON CANCER  . Chronic anticoagulation 2010   Coumadin  . Chronic diastolic CHF (congestive heart failure), NYHA class 2 (HCC)   . Hypertension   . OSA (obstructive sleep apnea) 2016   "couldn't take the mask during the testing" (10/01/2015)  . Permanent atrial fibrillation (HCC) 2010  . Pneumonia 3/29/2017and june 2017    SURGICAL HISTORY: Past Surgical History:  Procedure Laterality Date  . COLONOSCOPY WITH PROPOFOL N/A 03/04/2016   Procedure: COLONOSCOPY WITH PROPOFOL;  Surgeon: Jeani Hawking, MD;  Location: [facility];  Service: Endoscopy;  Laterality: N/A;  . LAPAROSCOPIC PARTIAL COLECTOMY N/A 04/12/2016   Procedure: LAPAROSCOPIC ILEOCOLECTOMY;  Surgeon: Almond Lint, MD;  Location: [facility] OR;  Service: General;  Laterality: N/A;  . PORTACATH PLACEMENT N/A 05/18/2016   Procedure: INSERTION PORT-A-CATH;  Surgeon: Almond Lint, MD;  Location: [facility];  Service: General;  Laterality: N/A;  . THORACOTOMY Right 2010     SOCIAL HISTORY: Social History   Social History  . Marital status: Married    Spouse name: Seward Meth  . Number of children: N/A  . Years of education: N/A   Occupational History  . Not on file.   Social History Main Topics  . Smoking status: Former Smoker    Packs/day: 0.10    Years: 2.00    Types: Cigarettes    Quit date: 12/16/1974  . Smokeless tobacco: Never Used     Comment: "1 pack cigarettes would last me a month"  . Alcohol use 0.0 oz/week     Comment: 10/01/2015 "might have1 6 pack of beer/year"  . Drug use: Yes    Types: Cocaine, Marijuana     Comment: Hx polysubstance abuse, quit 2008  . Sexual activity: Not Currently   Other Topics Concern  . 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   FAMILY HISTORY: Family History  Problem Relation Age of Onset  . Heart disease Father   . Heart  failure Father   . Colon polyps Father     unspecified number - pt of intestine surgically resected  . Hypertension Mother   . Diabetes Mother   . Hyperlipidemia Mother   . Colon polyps Mother     unspecified number - pt of intestine surgically resected  . Dementia Mother     d. 65  . Stroke Maternal Grandmother   . Stroke Sister   . Colon cancer Sister 68    w/ "cancerous polyps" - unspecified number; underwent surgery and radiation  . Colon polyps Brother     unspecified number - polypectomies  . Colon cancer Sister     dx 55-60; s/p surgery and radiation  . Bone cancer Maternal Uncle     dx. 59s  . Lung cancer Paternal Grandfather     d. 80s-90s; lung cancer vs TB  . Colon cancer Sister     dx. 15-60; s/p surgery and radiation  . Cancer Maternal Uncle     mother had about 7-8 other siblings, most passed at older ages, many of whom had some type of cancer  . Heart attack Neg Hx     ALLERGIES:  is allergic to no known allergies.  MEDICATIONS:  Current Outpatient Prescriptions   Medication Sig Dispense Refill  . acetaminophen (TYLENOL) 500 MG tablet Take 1,000 mg by mouth 2 (two) times daily.    Marland Kitchen albuterol (PROVENTIL HFA;VENTOLIN HFA) 108 (90 Base) MCG/ACT inhaler Inhale 2 puffs into the lungs every 6 (six) hours as needed for wheezing or shortness of breath. 1 Inhaler 2  . aspirin EC 81 MG tablet Take 81 mg by mouth daily.    Marland Kitchen docusate sodium (COLACE) 100 MG capsule Take 1 capsule (100 mg total) by mouth 2 (two) times daily. For constipation 60 capsule 3  . furosemide (LASIX) 80 MG tablet Take 1 tablet (80 mg total) by mouth daily.    . hydrALAZINE (APRESOLINE) 50 MG tablet TAKE ONE TABLET BY MOUTH TWICE DAILY 60 tablet 6  . ipratropium (ATROVENT HFA) 17 MCG/ACT inhaler Inhale 2 puffs into the lungs every 6 (six) hours as needed for wheezing.     . lidocaine-prilocaine (EMLA) cream Apply to affected area once (Patient taking differently: Apply 1 application topically once. Apply to affected area once) 30 g 3  . lisinopril (PRINIVIL,ZESTRIL) 20 MG tablet TAKE ONE TABLET BY MOUTH ONCE DAILY 30 tablet 10  . metoprolol tartrate (LOPRESSOR) 25 MG tablet TAKE ONE TABLET BY MOUTH TWICE DAILY 30 tablet 6  . potassium chloride SA (K-DUR,KLOR-CON) 20 MEQ tablet Take 2 tablets (40 mEq total) by mouth daily. 180 tablet 3  . sildenafil (REVATIO) 20 MG tablet TAKE TWO TO THREE TABLETS BY MOUTH ONCE DAILY AS NEEDED FOR SEXUAL ACTIVITY 30 tablet 2  . warfarin (COUMADIN) 4 MG tablet Take 4 mg by mouth daily. Monday- 1 tab Tuesday, wednesday- 2 tabs Thursday- 1 tab Friday, Saturday, Sunday- no coumadin    . ferrous sulfate 325 (65 FE) MG tablet Take 1 tablet (325 mg total) by mouth 2 (two) times daily with a meal. (Patient not taking: Reported on 10/21/2016) 60 tablet 3   No current facility-administered medications for this visit.     REVIEW OF SYSTEMS:  Constitutional: Denies fevers, chills or abnormal night sweats Eyes: Denies double vision or watery eyes  Ears, nose, mouth,  throat, and face: Denies mucositis or sore throat Respiratory: Denies cough, dyspnea or wheezes Cardiovascular: Denies palpitation, chest discomfort or lower extremity swelling,  Gastrointestinal:  Denies nausea, diarrhea, heartburn Skin: Denies abnormal skin rashes Lymphatics: Denies new lymphadenopathy or easy bruising Musculoskeletal: (+) pain/soreness to right lateral upper leg Neurological:(+) mild tingling in fingertips Behavioral/Psych: Mood is stable, no new changes  All other systems were reviewed with the patient and are negative.  PHYSICAL EXAMINATION: ECOG PERFORMANCE STATUS: 1  Vitals:   10/21/16 0928  BP: 113/60  Pulse: 74  Resp: 18  Temp: 97.8 F (36.6 C)   Filed Weights   10/21/16 0928  Weight: 252 lb 6.4 oz (114.5 kg)    GENERAL:alert, no distress and comfortable SKIN: skin color, texture, turgor are normal, no rashes or significant lesions EYES: normal, conjunctiva are pink and non-injected, sclera clear OROPHARYNX:no exudate, no erythema and lips, buccal mucosa, and tongue normal  NECK: supple, thyroid normal size, non-tender, without nodularity LYMPH:  no palpable lymphadenopathy in the cervical, axillary or inguinal LUNGS: clear to auscultation and percussion with normal breathing effort HEART: regular rate & rhythm and no murmurs and no lower extremity edema ABDOMEN:abdomen soft, non-tender and normal bowel sounds. (+) midline incision appears healing well, no discharge Musculoskeletal:no cyanosis of digits and no clubbing  PSYCH: alert & oriented x 3 with fluent speech NEURO: no focal motor/sensory deficits  LABORATORY DATA:  I have reviewed the data as listed CBC Latest Ref Rng & Units 10/21/2016 09/09/2016 08/25/2016  WBC 4.0 - 10.3 10e3/uL 5.7 5.9 4.3  Hemoglobin 13.0 - 17.1 g/dL 12.5(L) 10.4(L) 10.8(L)  Hematocrit 38.4 - 49.9 % 39.1 32.9(L) 33.4(L)  Platelets 140 - 400 10e3/uL 175 215 219   CMP Latest Ref Rng & Units 10/21/2016 09/09/2016 08/25/2016    Glucose 70 - 140 mg/dl 85 78 77  BUN 7.0 - 26.0 mg/dL 27.7(H) 11.4 7.4  Creatinine 0.7 - 1.3 mg/dL 0.9 0.8 0.8  Sodium 136 - 145 mEq/L 137 135(L) 136  Potassium 3.5 - 5.1 mEq/L 3.8 4.6 4.3  Chloride 101 - 111 mmol/L - - -  CO2 22 - 29 mEq/L 25 27 28   Calcium 8.4 - 10.4 mg/dL 9.4 8.8 8.8  Total Protein 6.4 - 8.3 g/dL 7.5 7.0 6.7  Total Bilirubin 0.20 - 1.20 mg/dL 0.60 0.59 0.51  Alkaline Phos 40 - 150 U/L 153(H) 181(H) 150  AST 5 - 34 U/L 15 16 35(H)  ALT 0 - 55 U/L 7 9 21    CEA: 04/07/16: 2.7 04/27/2016: 2.11 07/14/2016: 4.25 10/21/2016: 3.60  PATHOLOGY  Diagnosis 04/12/2016 Colon, segmental resection for tumor, Right and Terminal Ileum HIGH GRADE ADENOCARCINOMA OF THE CECUM (3 CM), GRADE 3 THE CARCINOMA INVADES MUSCULARIS PROPRIA (PT2) ALL MARGINS OF RESECTION ARE NEGATIVE FOR TUMOR METASTATIC ADENOCARCINOMA IN TWO OF SIXTEEN LYMPH NODES (2/16) UNREMARKABLE APPENDIX   MMR: (+) loss of MLH1 and PMS2  MSI-H, MLH-1 hypermethylation presents, BRAF mutation (-)  RADIOGRAPHIC STUDIES: I have personally reviewed the radiological images as listed and agreed with the findings in the report.  Renal US 08/07/2016 IMPRESSION: Bilateral nonobstructive nephrolithiasis. No hydronephrosis is noted.  CT chest, abdomen and pelvis 03/15/2016 IMPRESSION: 1. Polypoid cecal mass. No findings of liver metastatic disease or other definite metastatic disease. There are some small adjacent pericecal lymph nodes which are not pathologically enlarged. 2. Adenopathy in the chest is present but was also present in 2010, albeit slightly less prominent. This may be reactive adenopathy related to the chronic exudative right pleural effusion. 3. Prominent dilatation of the esophagus, nonspecific. Causes might include achalasia, neuropathy, scleroderma, pseudoachalasia, stricture, presbyesophagus, or Chagas disease. 4. Moderate 4  chamber cardiomegaly. 5. Liver morphology is suggestive but not  absolutely diagnostic for early cirrhosis. 6.  Prominent stool throughout the colon favors constipation. 7. Left iliopsoas bursitis. 8. Possible varicoceles in the scrotum bilaterally. 9. Impingement at L4-5 due to spondylosis and degenerative disc disease.  Colonoscopy 03/04/2016 Dr. [name]  A polypoid and ulcerated non-obstructing large mass was found in the cecum. The mass was noncircumferential. The mass measured three cm in length. In addition, its diameter measured three mm. No bleeding was present. This was biopsied with a cold forceps for histology. Findings: A 2 mm polyp was found in the transverse colon. The polyp was sessile. The polyp was removed with a cold biopsy forceps. Resection and retrieval were complete. A small ascending colon polyp was identified, image #3, near the mass. This polyp was not removed as it is anticipated that it will be removed with the mass surgically. - Likely malignant tumor in the cecum. Biopsied. - One 2 mm polyp in the transverse colon, removed with a cold biopsy forceps. Resected and retrieved.  ASSESSMENT & PLAN:  66 y.o. male, with past medical history of hypertension, OSA, CHF, obesity, presented with fatigue, dyspnea on exertion, and anemia. Screening colonoscopy showed a cecal mass.  1. Right colon cancer, invasive adenocarcinoma,G3,  pT2N1M0, stage IIIA, MSI-H -I previously reviewed his CT scan findings, and surgical pathology results in great details with patient and her family members -He had a complete surgical resection -His tumor has MSI high, but ML H1 hypermethylation present, supports sporadic colon cancer, 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 -We previously discussed the risk of cancer recurrence after surgery. Due to his stage IIIA disease, he is at moderate to high risk of recurrence -We previously discussed the standard adjuvant chemotherapy for stage III colon cancer,  reduce the risk of cancer recurrence. -He has been on adjuvant FOLFOX,  got sick with flu after cycle 5 chemo, and last cycle chemo was postponed  -he now has completed adjuvant chemo, recovered well  -He will be due for colonoscopy in September 2018 -will get a surveillance CT in 3 months  --I discussed the risk of cancer recurrence in the future. I discussed the surveillance plan, which is a physical exam and lab test (including CBC, CMP and CEA) every 3 months for the first 2 years, then every 6-12 months, colonoscopy in one year, and surveilliance CT scan every 6-12 month for up to 5 year.   2. Iron deficient anemia -Secondary to his colon cancer -His anemia has resolved -Continue OTC oral iron pill  3. HTN, OSA, CHF, obesity -He will continue follow-up with his primary care physician -We previously discussed that chemotherapy may affect his blood pressure, and we will monitor his blood pressure closely. We will be cautious about IV fluids, given his history of CHF.  4. Insomnia -Previously recommended Melatonin.   5. Fatigue, weight loss, anorexia, after recent flu and hospitalization  -he has completed recovered now   6. Soreness to right lateral upper leg -Encouraged the patient to take Tylenol and stay active to improve muscular soreness -Patient states asper cream helps  Plan -F/u and labs in 3 months with CT chest, Abdomen Pelvis with contrast prior to visit -Colonoscopy due in September 2018  All questions were answered. The patient knows to call the clinic with any problems, questions or concerns.  I spent 20 minutes counseling the patient face to face. The total time spent in the appointment was 25 minutes and more than 50% was on counseling.  This document serves as a record of services personally performed by [name], MD. It was created on her behalf by Arlyce Harman, a trained medical scribe. The creation of this record is based on the scribe's personal observations and the provider's statements to them. This document has been checked  and approved by the attending provider.  I have reviewed the above documentation for accuracy and completeness, and I agree with the above information.       [name], MD 10/21/2016

## 2016-10-21 ENCOUNTER — Ambulatory Visit (HOSPITAL_BASED_OUTPATIENT_CLINIC_OR_DEPARTMENT_OTHER): Payer: Medicaid Other | Admitting: Hematology

## 2016-10-21 ENCOUNTER — Ambulatory Visit: Payer: Medicaid Other

## 2016-10-21 ENCOUNTER — Telehealth: Payer: Self-pay | Admitting: Hematology

## 2016-10-21 ENCOUNTER — Encounter: Payer: Self-pay | Admitting: Hematology

## 2016-10-21 ENCOUNTER — Other Ambulatory Visit (HOSPITAL_BASED_OUTPATIENT_CLINIC_OR_DEPARTMENT_OTHER): Payer: Medicaid Other

## 2016-10-21 VITALS — BP 113/60 | HR 74 | Temp 97.8°F | Resp 18 | Ht 70.0 in | Wt 252.4 lb

## 2016-10-21 DIAGNOSIS — I10 Essential (primary) hypertension: Secondary | ICD-10-CM

## 2016-10-21 DIAGNOSIS — C182 Malignant neoplasm of ascending colon: Secondary | ICD-10-CM | POA: Diagnosis not present

## 2016-10-21 DIAGNOSIS — G4700 Insomnia, unspecified: Secondary | ICD-10-CM | POA: Diagnosis not present

## 2016-10-21 DIAGNOSIS — E669 Obesity, unspecified: Secondary | ICD-10-CM

## 2016-10-21 DIAGNOSIS — M79604 Pain in right leg: Secondary | ICD-10-CM

## 2016-10-21 DIAGNOSIS — I509 Heart failure, unspecified: Secondary | ICD-10-CM | POA: Diagnosis not present

## 2016-10-21 DIAGNOSIS — C180 Malignant neoplasm of cecum: Secondary | ICD-10-CM

## 2016-10-21 DIAGNOSIS — Z95828 Presence of other vascular implants and grafts: Secondary | ICD-10-CM

## 2016-10-21 DIAGNOSIS — I5033 Acute on chronic diastolic (congestive) heart failure: Secondary | ICD-10-CM

## 2016-10-21 DIAGNOSIS — D509 Iron deficiency anemia, unspecified: Secondary | ICD-10-CM | POA: Diagnosis not present

## 2016-10-21 LAB — CBC WITH DIFFERENTIAL/PLATELET
BASO%: 0.9 % (ref 0.0–2.0)
Basophils Absolute: 0.1 10*3/uL (ref 0.0–0.1)
EOS%: 3 % (ref 0.0–7.0)
Eosinophils Absolute: 0.2 10*3/uL (ref 0.0–0.5)
HCT: 39.1 % (ref 38.4–49.9)
HGB: 12.5 g/dL — ABNORMAL LOW (ref 13.0–17.1)
LYMPH%: 21.6 % (ref 14.0–49.0)
MCH: 27.7 pg (ref 27.2–33.4)
MCHC: 32 g/dL (ref 32.0–36.0)
MCV: 86.5 fL (ref 79.3–98.0)
MONO#: 0.6 10*3/uL (ref 0.1–0.9)
MONO%: 9.8 % (ref 0.0–14.0)
NEUT%: 64.7 % (ref 39.0–75.0)
NEUTROS ABS: 3.7 10*3/uL (ref 1.5–6.5)
Platelets: 175 10*3/uL (ref 140–400)
RBC: 4.52 10*6/uL (ref 4.20–5.82)
RDW: 16.3 % — ABNORMAL HIGH (ref 11.0–14.6)
WBC: 5.7 10*3/uL (ref 4.0–10.3)
lymph#: 1.2 10*3/uL (ref 0.9–3.3)

## 2016-10-21 LAB — COMPREHENSIVE METABOLIC PANEL
ALT: 7 U/L (ref 0–55)
AST: 15 U/L (ref 5–34)
Albumin: 3.5 g/dL (ref 3.5–5.0)
Alkaline Phosphatase: 153 U/L — ABNORMAL HIGH (ref 40–150)
Anion Gap: 11 mEq/L (ref 3–11)
BILIRUBIN TOTAL: 0.6 mg/dL (ref 0.20–1.20)
BUN: 27.7 mg/dL — ABNORMAL HIGH (ref 7.0–26.0)
CO2: 25 meq/L (ref 22–29)
CREATININE: 0.9 mg/dL (ref 0.7–1.3)
Calcium: 9.4 mg/dL (ref 8.4–10.4)
Chloride: 102 mEq/L (ref 98–109)
GLUCOSE: 85 mg/dL (ref 70–140)
Potassium: 3.8 mEq/L (ref 3.5–5.1)
SODIUM: 137 meq/L (ref 136–145)
TOTAL PROTEIN: 7.5 g/dL (ref 6.4–8.3)

## 2016-10-21 LAB — IRON AND TIBC
%SAT: 23 % (ref 20–55)
IRON: 72 ug/dL (ref 42–163)
TIBC: 306 ug/dL (ref 202–409)
UIBC: 235 ug/dL (ref 117–376)

## 2016-10-21 LAB — CEA (IN HOUSE-CHCC): CEA (CHCC-In House): 3.6 ng/mL (ref 0.00–5.00)

## 2016-10-21 LAB — FERRITIN: FERRITIN: 120 ng/mL (ref 22–316)

## 2016-10-21 MED ORDER — HEPARIN SOD (PORK) LOCK FLUSH 100 UNIT/ML IV SOLN
500.0000 [IU] | Freq: Once | INTRAVENOUS | Status: AC | PRN
  Administered 2016-10-21: 500 [IU] via INTRAVENOUS
  Filled 2016-10-21: qty 5

## 2016-10-21 MED ORDER — SODIUM CHLORIDE 0.9% FLUSH
10.0000 mL | INTRAVENOUS | Status: DC | PRN
  Administered 2016-10-21: 10 mL via INTRAVENOUS
  Filled 2016-10-21: qty 10

## 2016-10-21 NOTE — Telephone Encounter (Signed)
Gave patient AVS and calender per 4/20 los. [facility] to contact patient with CT schedule.

## 2016-11-15 ENCOUNTER — Other Ambulatory Visit: Payer: Self-pay | Admitting: Internal Medicine

## 2016-11-15 MED ORDER — WARFARIN SODIUM 4 MG PO TABS: ORAL_TABLET | ORAL | 0 refills | Status: DC

## 2016-11-18 ENCOUNTER — Ambulatory Visit (INDEPENDENT_AMBULATORY_CARE_PROVIDER_SITE_OTHER): Payer: Medicare Other | Admitting: Pharmacist

## 2016-11-18 DIAGNOSIS — I4821 Permanent atrial fibrillation: Secondary | ICD-10-CM

## 2016-11-18 DIAGNOSIS — I482 Chronic atrial fibrillation: Secondary | ICD-10-CM | POA: Diagnosis not present

## 2016-11-18 DIAGNOSIS — Z7901 Long term (current) use of anticoagulants: Secondary | ICD-10-CM

## 2016-11-18 LAB — POCT INR: INR: 1.5

## 2016-11-20 ENCOUNTER — Other Ambulatory Visit: Payer: Self-pay | Admitting: Internal Medicine

## 2016-11-21 NOTE — Telephone Encounter (Signed)
Rx request sent to pharmacy.  

## 2016-12-09 ENCOUNTER — Ambulatory Visit (INDEPENDENT_AMBULATORY_CARE_PROVIDER_SITE_OTHER): Payer: Medicare Other | Admitting: Pharmacist

## 2016-12-09 DIAGNOSIS — I4821 Permanent atrial fibrillation: Secondary | ICD-10-CM

## 2016-12-09 DIAGNOSIS — I482 Chronic atrial fibrillation: Secondary | ICD-10-CM

## 2016-12-09 DIAGNOSIS — Z7901 Long term (current) use of anticoagulants: Secondary | ICD-10-CM | POA: Diagnosis not present

## 2016-12-09 LAB — POCT INR: INR: 2.1

## 2016-12-19 ENCOUNTER — Other Ambulatory Visit: Payer: Self-pay | Admitting: Internal Medicine

## 2016-12-19 DIAGNOSIS — I4821 Permanent atrial fibrillation: Secondary | ICD-10-CM

## 2017-01-02 ENCOUNTER — Other Ambulatory Visit: Payer: Self-pay | Admitting: Internal Medicine

## 2017-01-16 ENCOUNTER — Other Ambulatory Visit (HOSPITAL_BASED_OUTPATIENT_CLINIC_OR_DEPARTMENT_OTHER): Payer: Medicare Other

## 2017-01-16 DIAGNOSIS — D509 Iron deficiency anemia, unspecified: Secondary | ICD-10-CM

## 2017-01-16 DIAGNOSIS — C182 Malignant neoplasm of ascending colon: Secondary | ICD-10-CM

## 2017-01-16 DIAGNOSIS — C180 Malignant neoplasm of cecum: Secondary | ICD-10-CM

## 2017-01-16 LAB — FERRITIN: Ferritin: 128 ng/ml (ref 22–316)

## 2017-01-16 LAB — CBC WITH DIFFERENTIAL/PLATELET
BASO%: 0.9 % (ref 0.0–2.0)
Basophils Absolute: 0.1 10*3/uL (ref 0.0–0.1)
EOS%: 4.1 % (ref 0.0–7.0)
Eosinophils Absolute: 0.3 10*3/uL (ref 0.0–0.5)
HEMATOCRIT: 49.3 % (ref 38.4–49.9)
HEMOGLOBIN: 15.9 g/dL (ref 13.0–17.1)
LYMPH#: 1.4 10*3/uL (ref 0.9–3.3)
LYMPH%: 19.1 % (ref 14.0–49.0)
MCH: 26.5 pg — AB (ref 27.2–33.4)
MCHC: 32.2 g/dL (ref 32.0–36.0)
MCV: 82.3 fL (ref 79.3–98.0)
MONO#: 0.6 10*3/uL (ref 0.1–0.9)
MONO%: 8.7 % (ref 0.0–14.0)
NEUT#: 4.9 10*3/uL (ref 1.5–6.5)
NEUT%: 67.2 % (ref 39.0–75.0)
PLATELETS: 159 10*3/uL (ref 140–400)
RBC: 5.99 10*6/uL — ABNORMAL HIGH (ref 4.20–5.82)
RDW: 16.6 % — AB (ref 11.0–14.6)
WBC: 7.3 10*3/uL (ref 4.0–10.3)

## 2017-01-16 LAB — COMPREHENSIVE METABOLIC PANEL
ALBUMIN: 3.7 g/dL (ref 3.5–5.0)
ALK PHOS: 104 U/L (ref 40–150)
ALT: 11 U/L (ref 0–55)
ANION GAP: 6 meq/L (ref 3–11)
AST: 17 U/L (ref 5–34)
BUN: 12.7 mg/dL (ref 7.0–26.0)
CALCIUM: 9.6 mg/dL (ref 8.4–10.4)
CHLORIDE: 102 meq/L (ref 98–109)
CO2: 28 mEq/L (ref 22–29)
CREATININE: 1 mg/dL (ref 0.7–1.3)
EGFR: >90 mL/min/{1.73_m2} (ref >90)
Glucose: 101 mg/dl (ref 70–140)
Potassium: 4.1 mEq/L (ref 3.5–5.1)
Sodium: 136 mEq/L (ref 136–145)
Total Bilirubin: 1.59 mg/dL — ABNORMAL HIGH (ref 0.20–1.20)
Total Protein: 7.6 g/dL (ref 6.4–8.3)

## 2017-01-16 LAB — CEA (IN HOUSE-CHCC): CEA (CHCC-In House): 3.76 ng/mL (ref 0.00–5.00)

## 2017-01-16 LAB — IRON AND TIBC
%SAT: 79 % — AB (ref 20–55)
IRON: 230 ug/dL — AB (ref 42–163)
TIBC: 291 ug/dL (ref 202–409)
UIBC: 61 ug/dL — ABNORMAL LOW (ref 117–376)

## 2017-01-18 ENCOUNTER — Telehealth: Payer: Self-pay

## 2017-01-18 ENCOUNTER — Ambulatory Visit: Payer: Medicaid Other | Admitting: Hematology

## 2017-01-18 NOTE — Telephone Encounter (Signed)
Patient was called back to verify if he had contrast in which he does not have any.  He will go to radiology 2hrs prior and drink there,

## 2017-01-18 NOTE — Progress Notes (Signed)
[place]  Telephone:(336) [phone] Fax:([phone]) [phone]  Clinic Follow up Note   Patient Care Team: [name], [name] as PCP - General (Family Medicine) [name], MD as Consulting Physician (General Surgery) Tania Ade, RN as Registered Nurse 01/23/2017   CHIEF COMPLAINTS:  Follow up stage III colon cancer  Oncology History   Cancer of right colon [facility])   Staging form: Colon and Rectum, AJCC 7th Edition   - Clinical stage from 04/12/2016: Stage IIIA (T2, N1, M0) - Signed by [name], MD on 05/01/2016      Cancer of right colon ([place])   03/04/2016 Procedure    COLONOSCOPY: A polypoid and ulcerated non-obstructing large mass was found in the cecum. The mass was noncircumferential. The mass measured three cm in length. In addition, its diameter measured three mm. (Dr. [name])       03/15/2016 Imaging    1. Polypoid cecal mass. No findings of liver metastatic disease or other definite metastatic disease. There are some small adjacent pericecal lymph nodes which are not pathologically enlarged. 2. Adenopathy in the chest is present but was also present in 2010, albeit slightly less prominent. This may be reactive adenopathy related to the chronic exudative right pleural effusion.       04/07/2016 Tumor Marker    Patient's tumor was tested for the following markers: CEA. Results of the tumor marker test revealed 2.7.      04/12/2016 Initial Diagnosis    Cecal cancer ([place])     04/12/2016 Definitive Surgery    Laparoscopic right hemicolectomy (Dr. [name])      04/12/2016 Pathologic Stage    pT2 pN1 pMX--Grade 3 adenocarcinoma with 2/16 nodes positive, clear margins, negative for perineural or lymphvascular invasion; invades muscularis propria Loss of expression MLH1/PMS2      06/02/2016 - 09/13/2016 Adjuvant Chemotherapy     Ajuvant chemotherapy FOLFOX, every 2 weeks for 6 cycles (3 months), last cycle postponed due to hospitalization         06/29/2016 Genetic Testing    POLE [id number] VUS identified on the Colorectal cancer panel.  Negative genetic testing for the MSH2 inversion analysis ([place] inversion). The Colorectal Cancer Panel offered by [facility] includes sequencing and/or duplication/deletion testing of the following 19 genes: APC, ATM, AXIN2, BMPR1A, CDH1, CHEK2, EPCAM, MLH1, MSH2, MSH6, MUTYH, PMS2, POLD1, POLE, PTEN, SCG5/GREM1, SMAD4, STK11, and TP53. The report date is 06/22/2016 for the [facility] panel and 06/29/2016 for the [place] inversion.      08/17/2016 - 08/20/2016 Hospital Admission    Admit date: 08/17/2016 Admission diagnosis: Acute respiratory failure with hypoxia  Additional comments: Patient was admitted initially with hypoxia and found to have H influenza positive-started on Tamiflu and broad-spectrum antibiotics were D escalate. He was hypotensive on admission given IV saline 75 cc per hour saline lock 2/16 and antihypertensives which were held including lisinopril and Lasix and hydralazine on discharge. Joint hospitalization he continued on his metoprolol twice a day      01/20/2017 Imaging    CT CAP W Contrast 01/20/17 IMPRESSION: 1. No evidence of local tumor recurrence at the ileocolic anastomosis . 2. No findings suspicious for metastatic disease in the chest, abdomen or pelvis. 3. Mild mediastinal lymphadenopathy is stable since 2010 and considered benign . 4. Stable morphologic changes suggestive of cirrhosis. 5. Stable patulous fluid-filled thoracic esophagus. Patchy ground-glass opacity in the posterior right upper lobe is probably inflammatory, possibly due to aspiration. 6. Stable chronic small loculated dependent right pleural  effusion.        HISTORY OF PRESENTING ILLNESS (04/27/2016):  Steven Barnett 66 y.o. male is here because of his recently diagnosed stage III colon cancer. She is accompanied by her wife to my clinic today.  This was discovered by screening colonoscopy in 03/2016.  At the time of his screening colonoscopy, he was found to have anemia and iron deficiency. He has had mild to moderate fatigue and dyspnea on exertion for several months. Colonoscopy showed a large no obstructive mass in the cecum, biopsy showed adenocarcinoma. He was referred to see surgeon Dr. [name] and underwent right hemicolectomy on 04/12/2016. He has been recovering slowly from surgery, still has mild to moderate pain at the incision, his energy level and appetite has not recovered well.   He was hospitalized for CHF in 09/2015 and was found to have pleural effusion, he is on lasix. He is able to do all his ADLs, and light activities, such as house work. He is not very physically active.    CURRENT THERAPY: Surveillance   INTERIM HISTORY:  Mr. Shirer returns for follow-up. He presents to the clinic today with his wife. He reports he is doing well. He has gained some weight, 4-5- pounds. His appetite is much better now. The tips of his fingers are starting to resolve from being black. He denies nausea, vomiting, and has normal BM.  He will have sporadic cough to where he can cough up phlegm.  He would like to get port removed soon by Dr. [name].    MEDICAL HISTORY:  Past Medical History:  Diagnosis Date  . Anemia   . Arthralgia of both knees   . Arthritis    "right knee" (10/01/2015)  . Atrial fibrillation ([place])   . Cancer ([facility])    STAGE 1 COLON CANCER  . Chronic anticoagulation 2010   Coumadin  . Chronic diastolic CHF (congestive heart failure), NYHA class 2 ([place])   . Hypertension   . OSA (obstructive sleep apnea) 2016   "couldn't take the mask during the testing" (10/01/2015)  . Permanent atrial fibrillation ([place]) 2010  . Pneumonia 3/29/2017and june 2017    SURGICAL HISTORY: Past Surgical History:  Procedure Laterality Date  . COLONOSCOPY WITH PROPOFOL N/A 03/04/2016   Procedure: COLONOSCOPY WITH PROPOFOL;  Surgeon: Carol Ada, MD;  Location: [facility];  Service: Endoscopy;   Laterality: N/A;  . LAPAROSCOPIC PARTIAL COLECTOMY N/A 04/12/2016   Procedure: LAPAROSCOPIC ILEOCOLECTOMY;  Surgeon: [name], MD;  Location: [place];  Service: General;  Laterality: N/A;  . PORTACATH PLACEMENT N/A 05/18/2016   Procedure: INSERTION PORT-A-CATH;  Surgeon: [name], MD;  Location: [place];  Service: General;  Laterality: N/A;  . THORACOTOMY Right 2010    SOCIAL HISTORY: Social History   Social History  . Marital status: Married    Spouse name: Ronnette Juniper  . Number of children: N/A  . Years of education: N/A   Occupational History  . Not on file.   Social History Main Topics  . Smoking status: Former Smoker    Packs/day: 0.10    Years: 2.00    Types: Cigarettes    Quit date: 12/16/1974  . Smokeless tobacco: Never Used     Comment: "1 pack cigarettes would last me a month"  . Alcohol use 0.0 oz/week     Comment: 10/01/2015 "might have1 6 pack of beer/year"  . Drug use: Yes    Types: Cocaine, Marijuana     Comment: Hx polysubstance abuse,  quit 2008  . Sexual activity: Not Currently   Other Topics Concern  . 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   FAMILY HISTORY: Family History  Problem Relation Age of Onset  . Heart disease Father   . Heart failure Father   . Colon polyps Father        unspecified number - pt of intestine surgically resected  . Hypertension Mother   . Diabetes Mother   . Hyperlipidemia Mother   . Colon polyps Mother        unspecified number - pt of intestine surgically resected  . Dementia Mother        d. 64  . Stroke Maternal Grandmother   . Stroke Sister   . Colon cancer Sister 33       w/ "cancerous polyps" - unspecified number; underwent surgery and radiation  . Colon polyps Brother        unspecified number - polypectomies  . Colon cancer Sister        dx 69-60; s/p surgery and radiation  . Bone cancer Maternal  Uncle        dx. 64s  . Lung cancer Paternal Grandfather        d. 80s-90s; lung cancer vs TB  . Colon cancer Sister        dx. 55-60; s/p surgery and radiation  . Cancer Maternal Uncle        mother had about 7-8 other siblings, most passed at older ages, many of whom had some type of cancer  . Heart attack Neg Hx     ALLERGIES:  is allergic to no known allergies.  MEDICATIONS:  Current Outpatient Prescriptions  Medication Sig Dispense Refill  . acetaminophen (TYLENOL) 500 MG tablet Take 1,000 mg by mouth 2 (two) times daily.    Marland Kitchen albuterol (PROVENTIL HFA;VENTOLIN HFA) 108 (90 Base) MCG/ACT inhaler Inhale 2 puffs into the lungs every 6 (six) hours as needed for wheezing or shortness of breath. 1 Inhaler 2  . aspirin EC 81 MG tablet Take 81 mg by mouth daily.    Marland Kitchen diltiazem (CARDIZEM) 120 MG tablet TAKE ONE TABLET BY MOUTH ONCE DAILY 30 tablet 11  . docusate sodium (COLACE) 100 MG capsule Take 1 capsule (100 mg total) by mouth 2 (two) times daily. For constipation 60 capsule 3  . ferrous sulfate 325 (65 FE) MG tablet Take 1 tablet (325 mg total) by mouth 2 (two) times daily with a meal. 60 tablet 3  . furosemide (LASIX) 80 MG tablet Take 1 tablet (80 mg total) by mouth daily.    . hydrALAZINE (APRESOLINE) 50 MG tablet TAKE ONE TABLET BY MOUTH TWICE DAILY 180 tablet 2  . ipratropium (ATROVENT HFA) 17 MCG/ACT inhaler Inhale 2 puffs into the lungs every 6 (six) hours as needed for wheezing.     . lidocaine-prilocaine (EMLA) cream Apply to affected area once (Patient taking differently: Apply 1 application topically once. Apply to affected area once) 30 g 3  . lisinopril (PRINIVIL,ZESTRIL) 20 MG tablet TAKE ONE TABLET BY MOUTH ONCE DAILY 30 tablet 10  . metoprolol tartrate (LOPRESSOR) 25 MG tablet TAKE ONE TABLET BY MOUTH TWICE DAILY 180 tablet 2  . potassium chloride SA (K-DUR,KLOR-CON) 20 MEQ tablet TAKE TWO TABLETS BY MOUTH ONCE DAILY 180 tablet 0  . warfarin (COUMADIN) 4 MG tablet Take 1  to 2 tablets by  mouth every day as directed by coumadin clinic 60 tablet 1   No current facility-administered medications for this visit.     REVIEW OF SYSTEMS:  Constitutional: Denies fevers, chills or abnormal night sweats (+) appetite improved (+) weigh gian Eyes: Denies double vision or watery eyes  Ears, nose, mouth, throat, and face: Denies mucositis or sore throat Respiratory: Denies dyspnea or wheezes (+) cough with phlegm.  Cardiovascular: Denies palpitation, chest discomfort or lower extremity swelling, Gastrointestinal:  Denies nausea, diarrhea, heartburn Skin: Denies abnormal skin rashes (+) darkness in fingertips improving Lymphatics: Denies new lymphadenopathy or easy bruising Musculoskeletal: (+) pain/soreness to right lateral upper leg Neurological: (+) mild tingling/numbness in fingertips Behavioral/Psych: Mood is stable, no new changes  All other systems were reviewed with the patient and are negative.  PHYSICAL EXAMINATION: ECOG PERFORMANCE STATUS: 1  Vitals:   01/23/17 1029  BP: 120/72  Pulse: 60  Resp: 20  Temp: (!) 96.6 F (35.9 C)   Filed Weights   01/23/17 1029  Weight: 260 lb (117.9 kg)     GENERAL:alert, no distress and comfortable SKIN: skin color, texture, turgor are normal, no rashes or significant lesions EYES: normal, conjunctiva are pink and non-injected, sclera clear OROPHARYNX:no exudate, no erythema and lips, buccal mucosa, and tongue normal  NECK: supple, thyroid normal size, non-tender, without nodularity LYMPH:  no palpable lymphadenopathy in the cervical, axillary or inguinal LUNGS: clear to auscultation and percussion with normal breathing effort HEART: regular rate & rhythm and no murmurs and no lower extremity edema ABDOMEN:abdomen soft, non-tender and normal bowel sounds. (+) midline incision appears healing well, no discharge Musculoskeletal:no cyanosis of digits and no clubbing  PSYCH: alert & oriented x 3 with fluent  speech NEURO: no focal motor/sensory deficits  LABORATORY DATA:  I have reviewed the data as listed CBC Latest Ref Rng & Units 01/16/2017 10/21/2016 09/09/2016  WBC 4.0 - 10.3 10e3/uL 7.3 5.7 5.9  Hemoglobin 13.0 - 17.1 g/dL 15.9 12.5(L) 10.4(L)  Hematocrit 38.4 - 49.9 % 49.3 39.1 32.9(L)  Platelets 140 - 400 10e3/uL 159 175 215   CMP Latest Ref Rng & Units 01/16/2017 10/21/2016 09/09/2016  Glucose 70 - 140 mg/dl 101 85 78  BUN 7.0 - 26.0 mg/dL 12.7 27.7(H) 11.4  Creatinine 0.7 - 1.3 mg/dL 1.0 0.9 0.8  Sodium 136 - 145 mEq/L 136 137 135(L)  Potassium 3.5 - 5.1 mEq/L 4.1 3.8 4.6  Chloride 101 - 111 mmol/L - - -  CO2 22 - 29 mEq/L 28 25 27   Calcium 8.4 - 10.4 mg/dL 9.6 9.4 8.8  Total Protein 6.4 - 8.3 g/dL 7.6 7.5 7.0  Total Bilirubin 0.20 - 1.20 mg/dL 1.59(H) 0.60 0.59  Alkaline Phos 40 - 150 U/L 104 153(H) 181(H)  AST 5 - 34 U/L 17 15 16   ALT 0 - 55 U/L 11 7 9     CEA: 04/07/16: 2.7 04/27/2016: 2.11 07/14/2016: 4.25 10/21/2016: 3.60 01/16/17: 3.76   PATHOLOGY  Diagnosis 04/12/2016 Colon, segmental resection for tumor, Right and Terminal Ileum HIGH GRADE ADENOCARCINOMA OF THE CECUM (3 CM), GRADE 3 THE CARCINOMA INVADES MUSCULARIS PROPRIA (PT2) ALL MARGINS OF RESECTION ARE NEGATIVE FOR TUMOR METASTATIC ADENOCARCINOMA IN TWO OF SIXTEEN LYMPH NODES (2/16) UNREMARKABLE APPENDIX   MMR: (+) loss of MLH1 and PMS2  MSI-H, MLH-1 hypermethylation presents, BRAF mutation (-)  RADIOGRAPHIC STUDIES: I have personally reviewed the radiological images as listed and agreed with the findings in the report.  CT CAP W Contrast 01/20/17 IMPRESSION: 1. No evidence of  local tumor recurrence at the ileocolic anastomosis . 2. No findings suspicious for metastatic disease in the chest, abdomen or pelvis. 3. Mild mediastinal lymphadenopathy is stable since 2010 and considered benign . 4. Stable morphologic changes suggestive of cirrhosis. 5. Stable patulous fluid-filled thoracic esophagus.  Patchy ground-glass opacity in the posterior right upper lobe is probably inflammatory, possibly due to aspiration. 6. Stable chronic small loculated dependent right pleural effusion.  Renal US 08/07/2016 IMPRESSION: Bilateral nonobstructive nephrolithiasis. No hydronephrosis is noted.  CT chest, abdomen and pelvis 03/15/2016 IMPRESSION: 1. Polypoid cecal mass. No findings of liver metastatic disease or other definite metastatic disease. There are some small adjacent pericecal lymph nodes which are not pathologically enlarged. 2. Adenopathy in the chest is present but was also present in 2010, albeit slightly less prominent. This may be reactive adenopathy related to the chronic exudative right pleural effusion. 3. Prominent dilatation of the esophagus, nonspecific. Causes might include achalasia, neuropathy, scleroderma, pseudoachalasia, stricture, presbyesophagus, or Chagas disease. 4. Moderate 4 chamber cardiomegaly. 5. Liver morphology is suggestive but not absolutely diagnostic for early cirrhosis. 6.  Prominent stool throughout the colon favors constipation. 7. Left iliopsoas bursitis. 8. Possible varicoceles in the scrotum bilaterally. 9. Impingement at L4-5 due to spondylosis and degenerative disc Disease.  PROCEDURES   Colonoscopy 03/04/2016 Dr. [name]  A polypoid and ulcerated non-obstructing large mass was found in the cecum. The mass was noncircumferential. The mass measured three cm in length. In addition, its diameter measured three mm. No bleeding was present. This was biopsied with a cold forceps for histology. Findings: A 2 mm polyp was found in the transverse colon. The polyp was sessile. The polyp was removed with a cold biopsy forceps. Resection and retrieval were complete. A small ascending colon polyp was identified, image #3, near the mass. This polyp was not removed as it is anticipated that it will be removed with the mass surgically. - Likely malignant  tumor in the cecum. Biopsied. - One 2 mm polyp in the transverse colon, removed with a cold biopsy forceps. Resected and retrieved.  ASSESSMENT & PLAN:  66 y.o. male, with past medical history of hypertension, OSA, CHF, obesity, presented with fatigue, dyspnea on exertion, and anemia. Screening colonoscopy showed a cecal mass.  1. Right colon cancer, invasive adenocarcinoma,G3,  pT2N1M0, stage IIIA, MSI-H -I previously reviewed his CT scan findings, and surgical pathology results in great details with patient and her family members -He had a complete surgical resection -His tumor has MSI high, but ML H1 hypermethylation present, supports sporadic colon cancer, 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 -We previously discussed the risk of cancer recurrence after surgery. Due to his stage IIIA disease, he is at moderate to high risk of recurrence -We previously discussed the standard adjuvant chemotherapy for stage III colon cancer,  reduce the risk of cancer recurrence. -He has completed adjuvant chemotherapy FOLFOX for 3 months, and last cycle chemo was postponed  -CT scan from 01/20/17 reviewed and shows no evidence of cancer recurrence. Patchy ground-glass opacity in the posterior right upper lobe is probably inflammatory, not very suspicious for metastasis, We'll continue follow up.   ---He will get port removed by Dr. [name] in the next couple of months.  -He will be due for colonoscopy in 03/2017 -f/u in 3 months, continue colon cancer surveillance.   2. Iron deficient anemia -Secondary to his colon cancer -His anemia has resolved, repeated study showed normal ferritin, slightly elevated serum iron. -Resolved now, he can stop his oral iron pill.  3. HTN, OSA, CHF, obesity -He will continue follow-up with his primary care physician  4. Insomnia -Previously recommended Melatonin.   5. Soreness to right lateral upper leg -Encouraged the patient to take Tylenol and stay active to improve muscular  soreness -Patient states asper cream helps -overall improved   6. Mild dry cough -CT scan from 01/20/17 shows a groundglass change in the right upper lobe lungs,  possible aspiration, patient has mild dry cough, but no evidence of URI.  -We will monitor, I suggest he exercise more  7. Mild hyperbilirubinemia  -His recent labs showed a slightly elevated total bilirubin, liver enzymes are within normal limits.  -CT scan showed no liver metastasis, the exam was unremarkable.  -Continue monitoring, we'll repeat his liver enzymes in 4-5 weeks.   Plan -Lab flush in 4-5 weeks -F/u and labs in 3 months  -Colonoscopy due in September 2018 -I sent a message to Dr. [name] to remove his port in the next few months   All questions were answered. The patient knows to call the clinic with any problems, questions or concerns.  I spent 25 minutes counseling the patient face to face. The total time spent in the appointment was 30 minutes and more than 50% was on counseling.  This document serves as a record of services personally performed by [name], MD. It was created on her behalf by Joslyn Devon, a trained medical scribe. The creation of this record is based on the scribe's personal observations and the provider's statements to them. This document has been checked and approved by the attending provider.   I have reviewed the above documentation for accuracy and completeness, and I agree with the above information.       [name], MD 01/23/2017

## 2017-01-18 NOTE — Telephone Encounter (Signed)
Received an inbasket message to cancel today's appt as his scan had not been scheduled.  The scan had been ordered 10/21/16 but had not been approved and or scheduled.   Called pt but his was on his way here already.  Per Dr Burr Medico she can still see him today if he would like.  Spoke with patient when he came in and scheduled scan and a new appt was placed for Dr Burr Medico.  A calendar has been printed for the patient.

## 2017-01-20 ENCOUNTER — Ambulatory Visit (HOSPITAL_COMMUNITY)
Admission: RE | Admit: 2017-01-20 | Discharge: 2017-01-20 | Disposition: A | Discharge status: Home / Self Care | Payer: Medicare Other | Source: Ambulatory Visit | Attending: Hematology | Admitting: Hematology

## 2017-01-20 DIAGNOSIS — J90 Pleural effusion, not elsewhere classified: Secondary | ICD-10-CM | POA: Diagnosis not present

## 2017-01-20 DIAGNOSIS — C180 Malignant neoplasm of cecum: Secondary | ICD-10-CM | POA: Diagnosis not present

## 2017-01-20 DIAGNOSIS — C182 Malignant neoplasm of ascending colon: Secondary | ICD-10-CM | POA: Diagnosis not present

## 2017-01-20 DIAGNOSIS — R590 Localized enlarged lymph nodes: Secondary | ICD-10-CM | POA: Diagnosis not present

## 2017-01-20 IMAGING — CT CT CHEST W/ CM
2 of 5 series · 12 of 36 positions shown, 15 images · IV contrast (ISOVUE 300)
Comparison: [DATE] CT chest, abdomen and pelvis.

CLINICAL DATA: Stage IIIA cecal adenocarcinoma status post right
hemicolectomy [DATE], status post adjuvant chemotherapy,
presenting for surveillance.

EXAM:
CT CHEST, ABDOMEN, AND PELVIS WITH CONTRAST
TECHNIQUE: Multidetector CT imaging of the chest, abdomen and pelvis was
performed following the standard protocol during bolus
administration of intravenous contrast.
CONTRAST:  100 cc [LX] IOPAMIDOL ([LX]) INJECTION 61%

[Series 2: cap with · axial · 0.87mm/px · z∈[+1195,+1735]mm · 9 of 136 slices shown, 12 images]
[im 14/136  mediastinal]
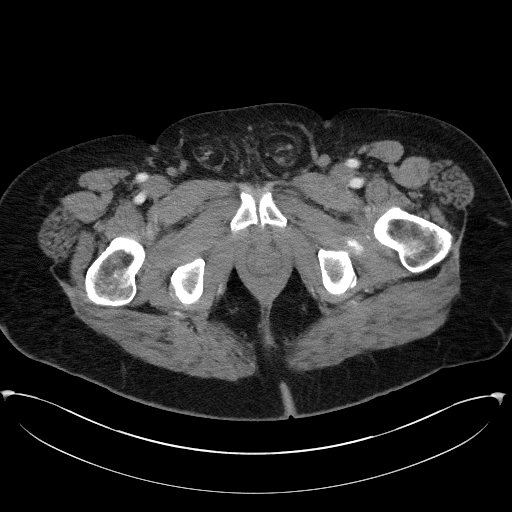
[im 14/136  lung]
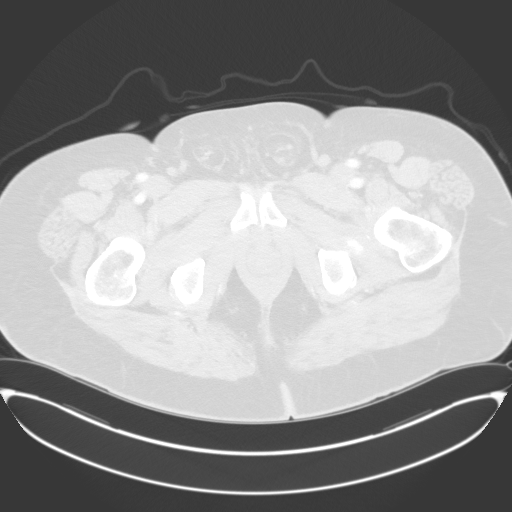
[im 28/136  lung]
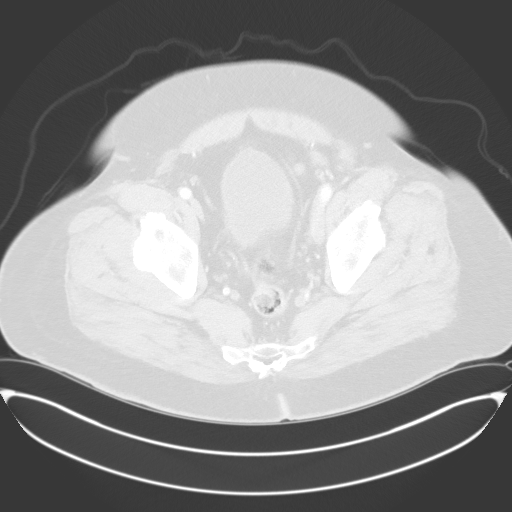
[im 41/136  lung]
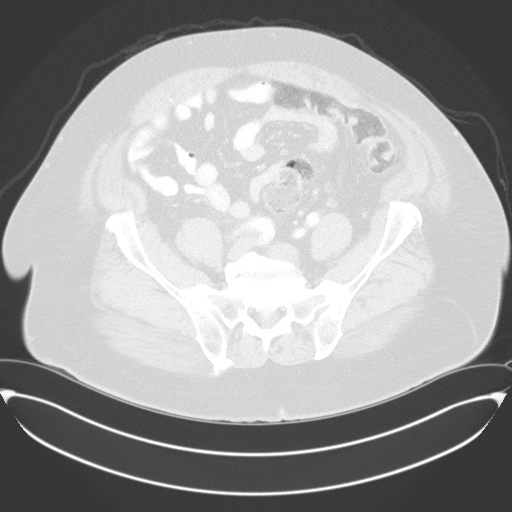
[im 55/136  lung]
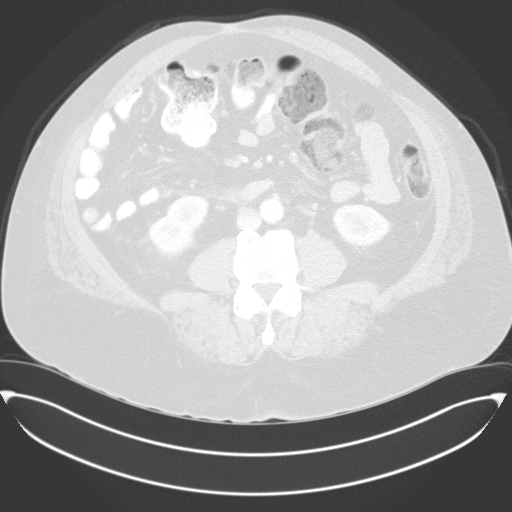
[im 68/136  mediastinal]
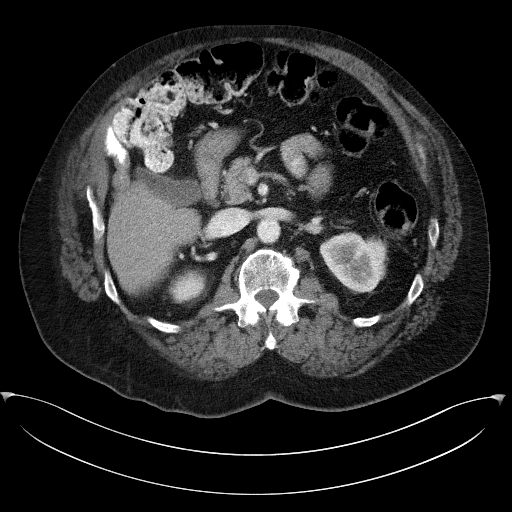
[im 68/136  lung]
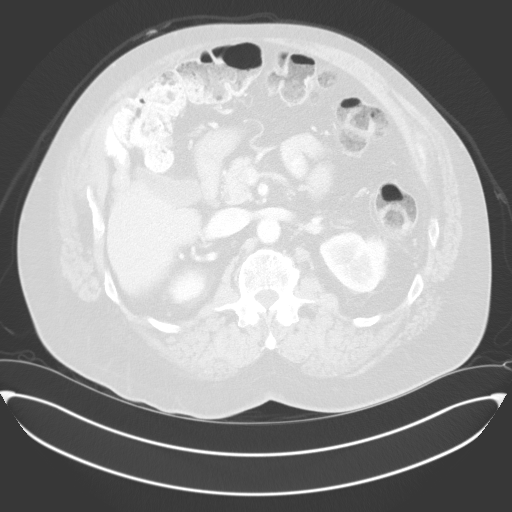
[im 82/136  lung]
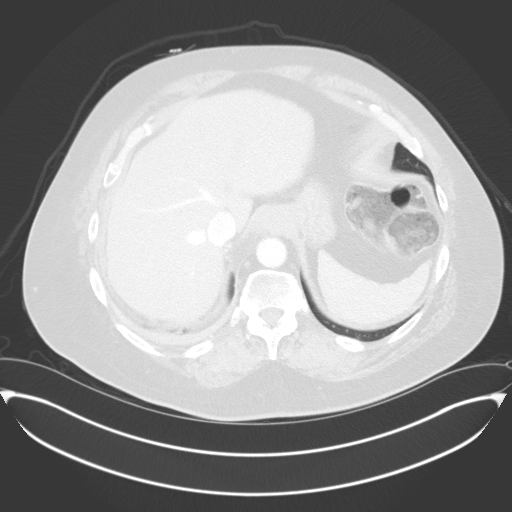
[im 95/136  lung]
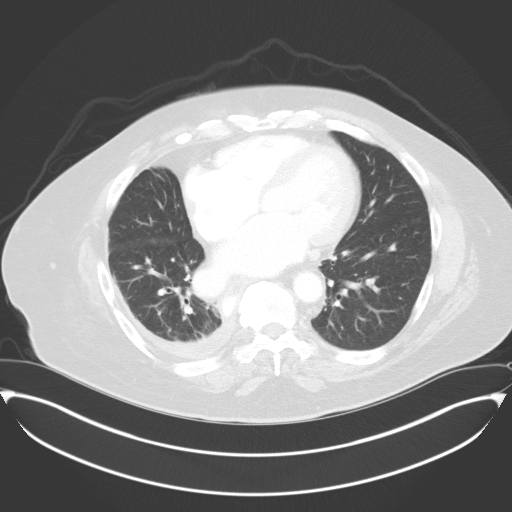
[im 109/136  lung]
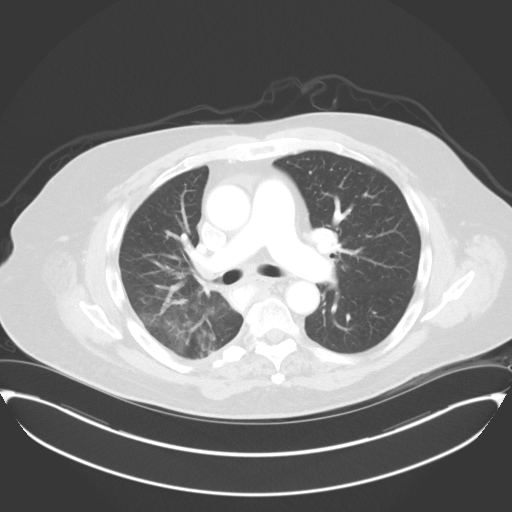
[im 122/136  mediastinal]
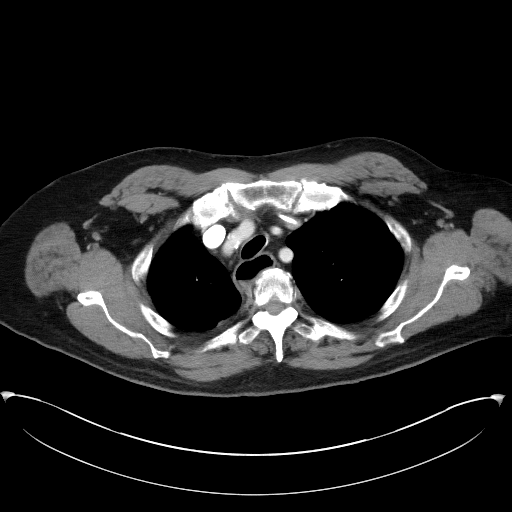
[im 122/136  lung]
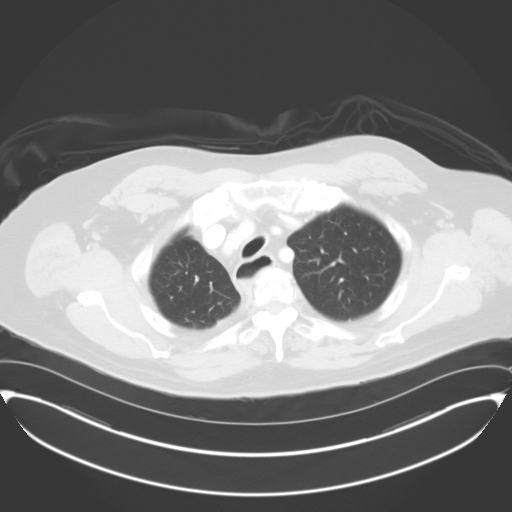

[Series 5: coronals · coronal · 0.89mm/px · 3 of 172 slices shown]
[im 35/172  lung]
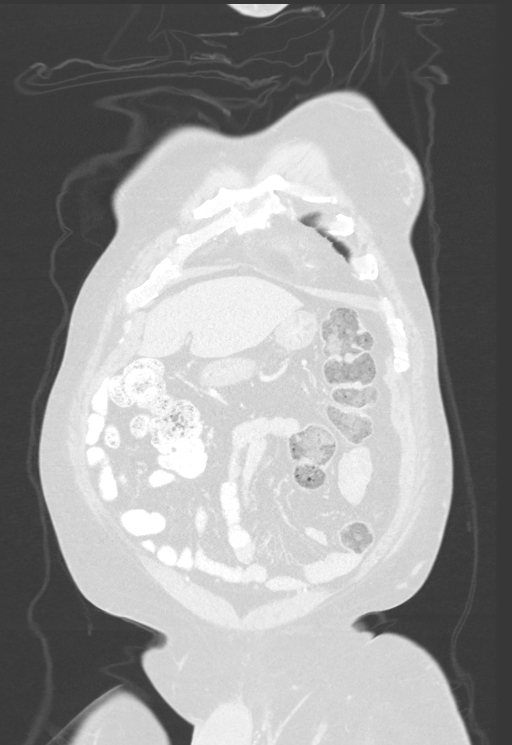
[im 69/172  lung]
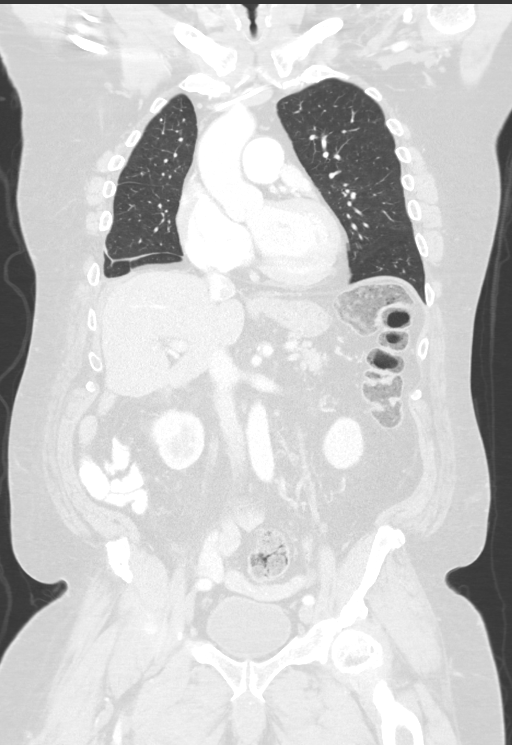
[im 103/172  lung]
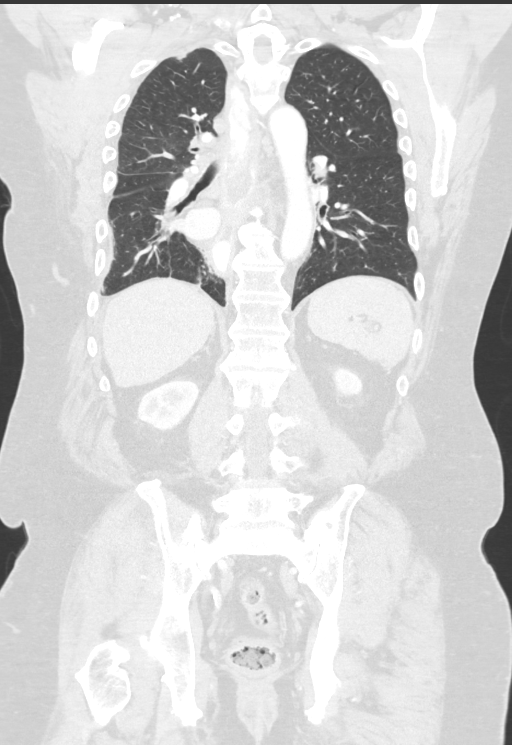

[12 of 36 positions shown; findings below may reference images not displayed]

FINDINGS: CT CHEST FINDINGS

Cardiovascular: Normal heart size. No significant pericardial
fluid/thickening. Left subclavian MediPort terminates in the upper
third of the superior vena cava. Atherosclerotic nonaneurysmal
thoracic aorta. Stable dilated main pulmonary artery (3.6 cm
diameter). No central pulmonary emboli.

Mediastinum/Nodes: No discrete thyroid nodules. Stable chronic
patulous thoracic esophagus with layering oral contrast. No axillary
adenopathy. Mildly enlarged 1.4 cm right paratracheal node (series
2/ image 22), stable back to [DATE] chest CT. Mildly enlarged
1.2 cm right subcarinal node (series 2/ image 35) is stable since
[DATE]. No new pathologically enlarged mediastinal or hilar
nodes.

Lungs/Pleura: No pneumothorax. Small dependent right pleural
effusion with associated smooth pleural thickening is stable. No
left pleural effusion. Stable tiny calcified bilateral upper lobe
granulomas. Patchy ground-glass opacity in the posterior right upper
lobe, more prominent compared to the [DATE] chest CT . Stable
parenchymal banding throughout the right lung base. No acute
consolidative airspace disease, lung masses or significant solid
pulmonary nodules.

Musculoskeletal: No aggressive appearing focal osseous lesions. Mild
thoracic spondylosis.

CT ABDOMEN PELVIS FINDINGS

Hepatobiliary: Liver surface appears finely irregular with the
suggestion of mild hypertrophy of the lateral segment left liver
lobe, unchanged, cannot exclude cirrhosis. No liver mass. Normal
gallbladder with no radiopaque cholelithiasis. No biliary ductal
dilatation.

Pancreas: Normal, with no mass or duct dilation.

Spleen: Normal size. No mass.

Adrenals/Urinary Tract: Normal adrenals. Normal kidneys with no
hydronephrosis and no renal mass. Normal bladder.

Stomach/Bowel: Grossly normal stomach. Status post subtotal right
hemicolectomy with intact appearing ileocolic anastomosis in the
right abdomen. No anastomotic wall thickening. No small bowel
dilatation or small bowel wall thickening. No wall thickening in the
remnant large bowel. Oral contrast progresses to the transverse
colon. No new significant pericolonic fat stranding.

Vascular/Lymphatic: Atherosclerotic nonaneurysmal abdominal aorta.
Patent portal, splenic and renal veins. No pathologically enlarged
lymph nodes in the abdomen or pelvis.

Reproductive: Top-normal size prostate.

Other: No pneumoperitoneum, ascites or focal fluid collection.

Musculoskeletal: No aggressive appearing focal osseous lesions.
Moderate lumbar spondylosis.
IMPRESSION: 1. No evidence of local tumor recurrence at the ileocolic
anastomosis .
2. No findings suspicious for metastatic disease in the chest,
abdomen or pelvis.
3. Mild mediastinal lymphadenopathy is stable since [LX] and
considered benign .
4. Stable morphologic changes suggestive of cirrhosis.
5. Stable patulous fluid-filled thoracic esophagus. Patchy
ground-glass opacity in the posterior right upper lobe is probably
inflammatory, possibly due to aspiration.
6. Stable chronic small loculated dependent right pleural effusion.

## 2017-01-20 MED ORDER — IOPAMIDOL (ISOVUE-370) INJECTION 76%: 100.0000 mL | Freq: Once | INTRAVENOUS | Status: DC | PRN

## 2017-01-20 MED ORDER — IOPAMIDOL (ISOVUE-300) INJECTION 61%
INTRAVENOUS | Status: AC
  Administered 2017-01-20: 100 mL
  Filled 2017-01-20: qty 100

## 2017-01-23 ENCOUNTER — Encounter: Payer: Self-pay | Admitting: Hematology

## 2017-01-23 ENCOUNTER — Ambulatory Visit (HOSPITAL_BASED_OUTPATIENT_CLINIC_OR_DEPARTMENT_OTHER): Payer: Medicare Other | Admitting: Hematology

## 2017-01-23 VITALS — BP 120/72 | HR 60 | Temp 96.6°F | Resp 20 | Ht 70.0 in | Wt 260.0 lb

## 2017-01-23 DIAGNOSIS — R05 Cough: Secondary | ICD-10-CM | POA: Diagnosis not present

## 2017-01-23 DIAGNOSIS — G4733 Obstructive sleep apnea (adult) (pediatric): Secondary | ICD-10-CM

## 2017-01-23 DIAGNOSIS — D500 Iron deficiency anemia secondary to blood loss (chronic): Secondary | ICD-10-CM

## 2017-01-23 DIAGNOSIS — E669 Obesity, unspecified: Secondary | ICD-10-CM | POA: Diagnosis not present

## 2017-01-23 DIAGNOSIS — I10 Essential (primary) hypertension: Secondary | ICD-10-CM

## 2017-01-23 DIAGNOSIS — G4700 Insomnia, unspecified: Secondary | ICD-10-CM

## 2017-01-23 DIAGNOSIS — C182 Malignant neoplasm of ascending colon: Secondary | ICD-10-CM | POA: Diagnosis not present

## 2017-01-23 DIAGNOSIS — M79604 Pain in right leg: Secondary | ICD-10-CM | POA: Diagnosis not present

## 2017-01-23 DIAGNOSIS — I5032 Chronic diastolic (congestive) heart failure: Secondary | ICD-10-CM

## 2017-01-23 DIAGNOSIS — I509 Heart failure, unspecified: Secondary | ICD-10-CM | POA: Diagnosis not present

## 2017-01-31 ENCOUNTER — Other Ambulatory Visit: Payer: Self-pay | Admitting: General Surgery

## 2017-02-08 ENCOUNTER — Telehealth: Payer: Self-pay | Admitting: Internal Medicine

## 2017-02-08 NOTE — Telephone Encounter (Signed)
New message    *STAT* If patient is at the pharmacy, call can be transferred to refill team.   1. Which medications need to be refilled? (please list name of each medication and dose if known) Viagra 20 mg   2. Which pharmacy/location (including street and city if local pharmacy) is medication to be sent to? [facility] on [address]    3. Do they need a 30 day or 90 day supply? 30 days supply.

## 2017-02-09 MED ORDER — SILDENAFIL CITRATE 20 MG PO TABS: 20.0000 mg | ORAL_TABLET | Freq: Every day | ORAL | 3 refills | Status: DC

## 2017-02-09 NOTE — Telephone Encounter (Signed)
Ok to refill - 1 month, 3 refills.  Dr. Lemmie Evens

## 2017-02-13 ENCOUNTER — Telehealth: Payer: Self-pay | Admitting: Internal Medicine

## 2017-02-13 NOTE — Telephone Encounter (Signed)
Called patient. He got sildenafil when on Medicaid but now [facility] requires PA.   [place] regarding PA for sildenafil 20mg  - 2-3 tabs PO daily PRN [phone]

## 2017-02-13 NOTE — Telephone Encounter (Signed)
New message       Pt states he need prior authorization on his viagra.  Please call [facility] at [address]

## 2017-02-14 NOTE — Telephone Encounter (Signed)
Pt notified it may take up to 2 weeks for PA to be processed.

## 2017-02-14 NOTE — Telephone Encounter (Signed)
F/u message  Pt call to f/u on medication. Please call back to discuss

## 2017-02-15 ENCOUNTER — Other Ambulatory Visit (HOSPITAL_COMMUNITY): Payer: Medicare Other

## 2017-02-15 ENCOUNTER — Encounter (HOSPITAL_COMMUNITY): Payer: Self-pay

## 2017-02-15 ENCOUNTER — Encounter (HOSPITAL_COMMUNITY)
Admission: RE | Admit: 2017-02-15 | Discharge: 2017-02-15 | Disposition: A | Discharge status: Home / Self Care | Payer: Medicare Other | Source: Ambulatory Visit | Attending: General Surgery | Admitting: General Surgery

## 2017-02-15 DIAGNOSIS — Z7982 Long term (current) use of aspirin: Secondary | ICD-10-CM | POA: Diagnosis not present

## 2017-02-15 DIAGNOSIS — Z800 Family history of malignant neoplasm of digestive organs: Secondary | ICD-10-CM | POA: Diagnosis not present

## 2017-02-15 DIAGNOSIS — M1711 Unilateral primary osteoarthritis, right knee: Secondary | ICD-10-CM | POA: Diagnosis not present

## 2017-02-15 DIAGNOSIS — Z85038 Personal history of other malignant neoplasm of large intestine: Secondary | ICD-10-CM | POA: Diagnosis not present

## 2017-02-15 DIAGNOSIS — I110 Hypertensive heart disease with heart failure: Secondary | ICD-10-CM | POA: Diagnosis not present

## 2017-02-15 DIAGNOSIS — G4733 Obstructive sleep apnea (adult) (pediatric): Secondary | ICD-10-CM | POA: Diagnosis not present

## 2017-02-15 DIAGNOSIS — Z7901 Long term (current) use of anticoagulants: Secondary | ICD-10-CM | POA: Diagnosis not present

## 2017-02-15 DIAGNOSIS — Z9049 Acquired absence of other specified parts of digestive tract: Secondary | ICD-10-CM | POA: Diagnosis not present

## 2017-02-15 DIAGNOSIS — Z8371 Family history of colonic polyps: Secondary | ICD-10-CM | POA: Diagnosis not present

## 2017-02-15 DIAGNOSIS — I482 Chronic atrial fibrillation: Secondary | ICD-10-CM | POA: Diagnosis not present

## 2017-02-15 DIAGNOSIS — Z8249 Family history of ischemic heart disease and other diseases of the circulatory system: Secondary | ICD-10-CM | POA: Diagnosis not present

## 2017-02-15 DIAGNOSIS — Z79899 Other long term (current) drug therapy: Secondary | ICD-10-CM | POA: Diagnosis not present

## 2017-02-15 DIAGNOSIS — I5032 Chronic diastolic (congestive) heart failure: Secondary | ICD-10-CM | POA: Diagnosis not present

## 2017-02-15 DIAGNOSIS — Z452 Encounter for adjustment and management of vascular access device: Secondary | ICD-10-CM | POA: Diagnosis not present

## 2017-02-15 HISTORY — DX: Cardiac arrhythmia, unspecified: I49.9

## 2017-02-15 LAB — COMPREHENSIVE METABOLIC PANEL
ALT: 20 U/L (ref 17–63)
AST: 32 U/L (ref 15–41)
Albumin: 3.8 g/dL (ref 3.5–5.0)
Alkaline Phosphatase: 92 U/L (ref 38–126)
Anion gap: 6 (ref 5–15)
BUN: 10 mg/dL (ref 6–20)
CHLORIDE: 102 mmol/L (ref 101–111)
CO2: 29 mmol/L (ref 22–32)
CREATININE: 0.95 mg/dL (ref 0.61–1.24)
Calcium: 8.9 mg/dL (ref 8.9–10.3)
GFR calc Af Amer: >60 mL/min (ref >60)
Glucose, Bld: 85 mg/dL (ref 65–99)
Potassium: 4.4 mmol/L (ref 3.5–5.1)
Sodium: 137 mmol/L (ref 135–145)
Total Bilirubin: 1.8 mg/dL — ABNORMAL HIGH (ref 0.3–1.2)
Total Protein: 7.4 g/dL (ref 6.5–8.1)

## 2017-02-15 LAB — CBC
HEMATOCRIT: 45 % (ref 39.0–52.0)
Hemoglobin: 14.9 g/dL (ref 13.0–17.0)
MCH: 26.9 pg (ref 26.0–34.0)
MCHC: 33.1 g/dL (ref 30.0–36.0)
MCV: 81.2 fL (ref 78.0–100.0)
Platelets: 154 10*3/uL (ref 150–400)
RBC: 5.54 MIL/uL (ref 4.22–5.81)
RDW: 17.1 % — AB (ref 11.5–15.5)
WBC: 6.5 10*3/uL (ref 4.0–10.5)

## 2017-02-15 MED ORDER — SILDENAFIL CITRATE 20 MG PO TABS: 40.0000 mg | ORAL_TABLET | Freq: Every day | ORAL | 0 refills | Status: DC

## 2017-02-15 NOTE — Telephone Encounter (Signed)
Called patient to notify him of drug coverage and offered to send Rx to [facility] - patient agreed Rx(s) sent to pharmacy electronically.

## 2017-02-15 NOTE — Pre-Procedure Instructions (Signed)
Steven Barnett  02/15/2017      [place], [address] [place] [place] [address] Phone: [phone] Fax: [place], [address] [place] [address] Phone: [phone] Fax: [phone]    Your procedure is scheduled on   Thursday   02/16/17  Report to [facility] Admitting at 815 A.M.  Call this number if you have problems the morning of surgery:  [phone]   Remember:  Do not eat food or drink liquids after midnight.  Take these medicines the morning of surgery with A SIP OF WATER    TYLENOL IF NEEDED, ALBUTEROL+ ATROVENT  IF NEEDED, DILTIAZEM (CARDIZEM) , HYDRALAZINE, METOPROLOL (LOPRESSOR), POTASSIUM  7 days prior to surgery STOP taking any  Aleve, Naproxen, Ibuprofen, Motrin, Advil, Goody's, BC's, all herbal medications, fish oil, and all vitamins   STOP COUMADIN AS DIRECTED   Do not wear jewelry, make-up or nail polish.  Do not wear lotions, powders, or perfumes, or deoderant.  Do not shave 48 hours prior to surgery.  Men may shave face and neck.  Do not bring valuables to the hospital.  [facility] is not responsible for any belongings or valuables.  Contacts, dentures or bridgework may not be worn into surgery.  Leave your suitcase in the car.  After surgery it may be brought to your room.  For patients admitted to the hospital, discharge time will be determined by your treatment team.  Patients discharged the day of surgery will not be allowed to drive home.   Name and phone number of your driver:    Special instructions:  [place] - Preparing for Surgery  Before surgery, you can play an important role.  Because skin is not sterile, your skin needs to be as free of germs as possible.  You can reduce the number of germs on you skin by washing with CHG (chlorahexidine gluconate) soap before surgery.  CHG is an  antiseptic cleaner which kills germs and bonds with the skin to continue killing germs even after washing.  Please DO NOT use if you have an allergy to CHG or antibacterial soaps.  If your skin becomes reddened/irritated stop using the CHG and inform your nurse when you arrive at Short Stay.  Do not shave (including legs and underarms) for at least 48 hours prior to the first CHG shower.  You may shave your face.  Please follow these instructions carefully:   1.  Shower with CHG Soap the night before surgery and the                                morning of Surgery.  2.  If you choose to wash your hair, wash your hair first as usual with your       normal shampoo.  3.  After you shampoo, rinse your hair and body thoroughly to remove the                      Shampoo.  4.  Use CHG as you would any other liquid soap.  You can apply chg directly       to the skin and wash gently with scrungie or a clean washcloth.  5.  Apply the CHG Soap to your body ONLY FROM THE  NECK DOWN.        Do not use on open wounds or open sores.  Avoid contact with your eyes,       ears, mouth and genitals (private parts).  Wash genitals (private parts)       with your normal soap.  6.  Wash thoroughly, paying special attention to the area where your surgery        will be performed.  7.  Thoroughly rinse your body with warm water from the neck down.  8.  DO NOT shower/wash with your normal soap after using and rinsing off       the CHG Soap.  9.  Pat yourself dry with a clean towel.            10.  Wear clean pajamas.            11.  Place clean sheets on your bed the night of your first shower and do not        sleep with pets.  Day of Surgery  Do not apply any lotions/deoderants the morning of surgery.  Please wear clean clothes to the hospital/surgery center.    Please read over the following fact sheets that you were given. Pain Booklet and Surgical Site Infection Prevention

## 2017-02-15 NOTE — Progress Notes (Signed)
   02/15/17 1400  OBSTRUCTIVE SLEEP APNEA  Have you ever been diagnosed with sleep apnea through a sleep study? (NEVER FINISHED TESTING )  Do you snore loudly (loud enough to be heard through closed doors)?  0  Do you often feel tired, fatigued, or sleepy during the daytime (such as falling asleep during driving or talking to someone)? 1  Has anyone observed you stop breathing during your sleep? 0  Do you have, or are you being treated for high blood pressure? 1  BMI more than 35 kg/m2? 1  Neck circumference greater than:Male 16 inches or larger, Male 17inches or larger? 1  Male Gender (Yes=1) 1  Obstructive Sleep Apnea Score 5  Score 5 or greater  Results sent to PCP

## 2017-02-15 NOTE — Progress Notes (Signed)
PATIENT STATED HE STOPPED COUMADIN (LAST DOSE WAS 02/10/17).

## 2017-02-15 NOTE — Telephone Encounter (Addendum)
Spoke with pharmacy staff in office - sildenafil citrate (revatio) is not generally covered under Medicare plans, nor is brand name Viagra. Suggested to send Rx to [facility] for #50 for $80 - instructions take 2-3 tabs PO PRN.

## 2017-02-16 ENCOUNTER — Ambulatory Visit (HOSPITAL_COMMUNITY): Payer: Medicare Other | Admitting: Certified Registered"

## 2017-02-16 ENCOUNTER — Encounter (HOSPITAL_COMMUNITY)
Admission: RE | Disposition: A | Discharge status: Home / Self Care | Payer: Self-pay | Source: Ambulatory Visit | Attending: General Surgery

## 2017-02-16 ENCOUNTER — Ambulatory Visit (HOSPITAL_COMMUNITY)
Admission: RE | Admit: 2017-02-16 | Discharge: 2017-02-16 | Disposition: A | Discharge status: Home / Self Care | Payer: Medicare Other | Source: Ambulatory Visit | Attending: General Surgery | Admitting: General Surgery

## 2017-02-16 DIAGNOSIS — Z800 Family history of malignant neoplasm of digestive organs: Secondary | ICD-10-CM | POA: Diagnosis not present

## 2017-02-16 DIAGNOSIS — Z9049 Acquired absence of other specified parts of digestive tract: Secondary | ICD-10-CM | POA: Insufficient documentation

## 2017-02-16 DIAGNOSIS — Z7901 Long term (current) use of anticoagulants: Secondary | ICD-10-CM | POA: Insufficient documentation

## 2017-02-16 DIAGNOSIS — C189 Malignant neoplasm of colon, unspecified: Secondary | ICD-10-CM | POA: Diagnosis not present

## 2017-02-16 DIAGNOSIS — Z452 Encounter for adjustment and management of vascular access device: Secondary | ICD-10-CM | POA: Insufficient documentation

## 2017-02-16 DIAGNOSIS — Z7982 Long term (current) use of aspirin: Secondary | ICD-10-CM | POA: Insufficient documentation

## 2017-02-16 DIAGNOSIS — M1711 Unilateral primary osteoarthritis, right knee: Secondary | ICD-10-CM | POA: Diagnosis not present

## 2017-02-16 DIAGNOSIS — I509 Heart failure, unspecified: Secondary | ICD-10-CM | POA: Diagnosis not present

## 2017-02-16 DIAGNOSIS — Z85038 Personal history of other malignant neoplasm of large intestine: Secondary | ICD-10-CM | POA: Diagnosis not present

## 2017-02-16 DIAGNOSIS — I5032 Chronic diastolic (congestive) heart failure: Secondary | ICD-10-CM | POA: Insufficient documentation

## 2017-02-16 DIAGNOSIS — Z8371 Family history of colonic polyps: Secondary | ICD-10-CM | POA: Insufficient documentation

## 2017-02-16 DIAGNOSIS — I482 Chronic atrial fibrillation: Secondary | ICD-10-CM | POA: Diagnosis not present

## 2017-02-16 DIAGNOSIS — Z79899 Other long term (current) drug therapy: Secondary | ICD-10-CM | POA: Insufficient documentation

## 2017-02-16 DIAGNOSIS — I110 Hypertensive heart disease with heart failure: Secondary | ICD-10-CM | POA: Diagnosis not present

## 2017-02-16 DIAGNOSIS — Z8249 Family history of ischemic heart disease and other diseases of the circulatory system: Secondary | ICD-10-CM | POA: Insufficient documentation

## 2017-02-16 DIAGNOSIS — G4733 Obstructive sleep apnea (adult) (pediatric): Secondary | ICD-10-CM | POA: Insufficient documentation

## 2017-02-16 HISTORY — PX: PORT-A-CATH REMOVAL: SHX5289

## 2017-02-16 LAB — PROTIME-INR
INR: 1.07
PROTHROMBIN TIME: 13.9 s (ref 11.4–15.2)

## 2017-02-16 SURGERY — REMOVAL PORT-A-CATH
Anesthesia: Monitor Anesthesia Care | Site: Chest

## 2017-02-16 MED ORDER — OXYCODONE HCL 5 MG PO TABS: 5.0000 mg | ORAL_TABLET | Freq: Four times a day (QID) | ORAL | 0 refills | Status: DC | PRN

## 2017-02-16 MED ORDER — LACTATED RINGERS IV SOLN
INTRAVENOUS | Status: DC
  Administered 2017-02-16: 10:00:00 via INTRAVENOUS

## 2017-02-16 MED ORDER — MEPERIDINE HCL 25 MG/ML IJ SOLN: 6.2500 mg | INTRAMUSCULAR | Status: DC | PRN

## 2017-02-16 MED ORDER — 0.9 % SODIUM CHLORIDE (POUR BTL) OPTIME
TOPICAL | Status: DC | PRN
  Administered 2017-02-16: 1000 mL

## 2017-02-16 MED ORDER — LIDOCAINE HCL 1 % IJ SOLN
INTRAMUSCULAR | Status: DC | PRN
  Administered 2017-02-16: 10 mL

## 2017-02-16 MED ORDER — ROCURONIUM BROMIDE 10 MG/ML (PF) SYRINGE
PREFILLED_SYRINGE | INTRAVENOUS | Status: AC
  Filled 2017-02-16: qty 5

## 2017-02-16 MED ORDER — CEFAZOLIN SODIUM-DEXTROSE 2-4 GM/100ML-% IV SOLN
2.0000 g | INTRAVENOUS | Status: AC
  Administered 2017-02-16: 2 g via INTRAVENOUS
  Filled 2017-02-16: qty 100

## 2017-02-16 MED ORDER — OXYCODONE HCL 5 MG PO TABS
ORAL_TABLET | ORAL | Status: AC
  Filled 2017-02-16: qty 2

## 2017-02-16 MED ORDER — FENTANYL CITRATE (PF) 250 MCG/5ML IJ SOLN
INTRAMUSCULAR | Status: AC
  Filled 2017-02-16: qty 5

## 2017-02-16 MED ORDER — ACETAMINOPHEN 650 MG RE SUPP: 650.0000 mg | RECTAL | Status: DC | PRN

## 2017-02-16 MED ORDER — CHLORHEXIDINE GLUCONATE CLOTH 2 % EX PADS: 6.0000 | MEDICATED_PAD | Freq: Once | CUTANEOUS | Status: DC

## 2017-02-16 MED ORDER — LIDOCAINE 2% (20 MG/ML) 5 ML SYRINGE
INTRAMUSCULAR | Status: AC
  Filled 2017-02-16: qty 5

## 2017-02-16 MED ORDER — PROPOFOL 10 MG/ML IV BOLUS
INTRAVENOUS | Status: DC | PRN
Start: 2017-02-16 — End: 2017-02-16
  Administered 2017-02-16: 40 mg via INTRAVENOUS

## 2017-02-16 MED ORDER — OXYCODONE HCL 5 MG PO TABS
5.0000 mg | ORAL_TABLET | ORAL | Status: DC | PRN
  Administered 2017-02-16: 10 mg via ORAL

## 2017-02-16 MED ORDER — LACTATED RINGERS IV SOLN: INTRAVENOUS | Status: DC

## 2017-02-16 MED ORDER — ACETAMINOPHEN 325 MG PO TABS
650.0000 mg | ORAL_TABLET | ORAL | Status: DC | PRN
  Administered 2017-02-16: 650 mg via ORAL

## 2017-02-16 MED ORDER — PROMETHAZINE HCL 25 MG/ML IJ SOLN: 6.2500 mg | INTRAMUSCULAR | Status: DC | PRN

## 2017-02-16 MED ORDER — MIDAZOLAM HCL 2 MG/2ML IJ SOLN
INTRAMUSCULAR | Status: AC
  Filled 2017-02-16: qty 2

## 2017-02-16 MED ORDER — ACETAMINOPHEN 325 MG PO TABS
ORAL_TABLET | ORAL | Status: AC
  Filled 2017-02-16: qty 2

## 2017-02-16 MED ORDER — LIDOCAINE HCL (CARDIAC) 20 MG/ML IV SOLN
INTRAVENOUS | Status: DC | PRN
  Administered 2017-02-16: 100 mg via INTRATRACHEAL

## 2017-02-16 MED ORDER — FENTANYL CITRATE (PF) 100 MCG/2ML IJ SOLN: 25.0000 ug | INTRAMUSCULAR | Status: DC | PRN

## 2017-02-16 MED ORDER — PROPOFOL 500 MG/50ML IV EMUL
INTRAVENOUS | Status: DC | PRN
  Administered 2017-02-16: 100 ug/kg/min via INTRAVENOUS

## 2017-02-16 MED ORDER — PROPOFOL 10 MG/ML IV BOLUS
INTRAVENOUS | Status: AC
  Filled 2017-02-16: qty 20

## 2017-02-16 SURGICAL SUPPLY — 32 items
BLADE SURG 15 STRL LF DISP TIS (BLADE) ×1 IMPLANT
BLADE SURG 15 STRL SS (BLADE) ×2
CHLORAPREP W/TINT 10.5 ML (MISCELLANEOUS) ×3 IMPLANT
COVER SURGICAL LIGHT HANDLE (MISCELLANEOUS) ×3 IMPLANT
DECANTER SPIKE VIAL GLASS SM (MISCELLANEOUS) ×6 IMPLANT
DERMABOND ADVANCED (GAUZE/BANDAGES/DRESSINGS) ×2
DERMABOND ADVANCED .7 DNX12 (GAUZE/BANDAGES/DRESSINGS) ×1 IMPLANT
DRAPE LAPAROTOMY 100X72 PEDS (DRAPES) ×3 IMPLANT
DRAPE UTILITY XL STRL (DRAPES) ×6 IMPLANT
ELECT CAUTERY BLADE 6.4 (BLADE) ×3 IMPLANT
ELECT REM PT RETURN 9FT ADLT (ELECTROSURGICAL) ×3
ELECTRODE REM PT RTRN 9FT ADLT (ELECTROSURGICAL) ×1 IMPLANT
GAUZE SPONGE 4X4 16PLY XRAY LF (GAUZE/BANDAGES/DRESSINGS) ×3 IMPLANT
GLOVE BIO SURGEON STRL SZ 6 (GLOVE) ×3 IMPLANT
GLOVE BIOGEL PI IND STRL 6.5 (GLOVE) ×1 IMPLANT
GLOVE BIOGEL PI INDICATOR 6.5 (GLOVE) ×2
GOWN STRL REUS W/ TWL LRG LVL3 (GOWN DISPOSABLE) ×1 IMPLANT
GOWN STRL REUS W/TWL 2XL LVL3 (GOWN DISPOSABLE) ×3 IMPLANT
GOWN STRL REUS W/TWL LRG LVL3 (GOWN DISPOSABLE) ×2
KIT BASIN OR (CUSTOM PROCEDURE TRAY) ×3 IMPLANT
KIT ROOM TURNOVER OR (KITS) ×3 IMPLANT
NEEDLE HYPO 25GX1X1/2 BEV (NEEDLE) ×3 IMPLANT
NS IRRIG 1000ML POUR BTL (IV SOLUTION) ×3 IMPLANT
PACK SURGICAL SETUP 50X90 (CUSTOM PROCEDURE TRAY) ×3 IMPLANT
PAD ARMBOARD 7.5X6 YLW CONV (MISCELLANEOUS) ×6 IMPLANT
PENCIL BUTTON HOLSTER BLD 10FT (ELECTRODE) ×3 IMPLANT
SUT MON AB 4-0 PC3 18 (SUTURE) ×3 IMPLANT
SUT VIC AB 3-0 SH 27 (SUTURE) ×2
SUT VIC AB 3-0 SH 27X BRD (SUTURE) ×1 IMPLANT
SYR CONTROL 10ML LL (SYRINGE) ×3 IMPLANT
TOWEL OR 17X24 6PK STRL BLUE (TOWEL DISPOSABLE) ×3 IMPLANT
TOWEL OR 17X26 10 PK STRL BLUE (TOWEL DISPOSABLE) ×3 IMPLANT

## 2017-02-16 NOTE — H&P (Signed)
Steven Barnett is an 66 y.o. male.   Chief Complaint: colon cancer HPI:  Pt is a 66 yo M s/p lap right colectomy for colon cancer 04/2016.  He received adjuvant chemotherapy.  He has completed the course and desires port removal.    Past Medical History:  Diagnosis Date  . Anemia   . Arthralgia of both knees   . Arthritis    "right knee" (10/01/2015)  . Atrial fibrillation (HCC)   . Cancer (HCC)    STAGE 1 COLON CANCER  . Chronic anticoagulation 2010   Coumadin  . Chronic diastolic CHF (congestive heart failure), NYHA class 2 (HCC)   . Dysrhythmia   . Hypertension   . OSA (obstructive sleep apnea) 2016   "couldn't take the mask during the testing" (10/01/2015)  . Permanent atrial fibrillation (HCC) 2010  . Pneumonia 3/29/2017and june 2017    Past Surgical History:  Procedure Laterality Date  . COLONOSCOPY WITH PROPOFOL N/A 03/04/2016   Procedure: COLONOSCOPY WITH PROPOFOL;  Surgeon: Jeani Hawking, MD;  Location: [facility];  Service: Endoscopy;  Laterality: N/A;  . LAPAROSCOPIC PARTIAL COLECTOMY N/A 04/12/2016   Procedure: LAPAROSCOPIC ILEOCOLECTOMY;  Surgeon: Almond Lint, MD;  Location: [facility] OR;  Service: General;  Laterality: N/A;  . PORTACATH PLACEMENT N/A 05/18/2016   Procedure: INSERTION PORT-A-CATH;  Surgeon: Almond Lint, MD;  Location: [facility];  Service: General;  Laterality: N/A;  . THORACOTOMY Right 2010    Family History  Problem Relation Age of Onset  . Heart disease Father   . Heart failure Father   . Colon polyps Father        unspecified number - pt of intestine surgically resected  . Hypertension Mother   . Diabetes Mother   . Hyperlipidemia Mother   . Colon polyps Mother        unspecified number - pt of intestine surgically resected  . Dementia Mother        d. 32  . Stroke Maternal Grandmother   . Stroke Sister   . Colon cancer Sister 82       w/ "cancerous polyps" - unspecified number; underwent surgery and radiation  . Colon  polyps Brother        unspecified number - polypectomies  . Colon cancer Sister        dx 55-60; s/p surgery and radiation  . Bone cancer Maternal Uncle        dx. 60s  . Lung cancer Paternal Grandfather        d. 80s-90s; lung cancer vs TB  . Colon cancer Sister        dx. 89-60; s/p surgery and radiation  . Cancer Maternal Uncle        mother had about 7-8 other siblings, most passed at older ages, many of whom had some type of cancer  . Heart attack Neg Hx    Social History:  reports that he quit smoking about 42 years ago. His smoking use included Cigarettes. He has a 0.20 pack-year smoking history. He has never used smokeless tobacco. He reports that he drinks alcohol. He reports that he uses drugs, including Cocaine and Marijuana.  Allergies:  Allergies  Allergen Reactions  . No Known Allergies     Medications Prior to Admission  Medication Sig Dispense Refill  . acetaminophen (TYLENOL) 500 MG tablet Take 1,000 mg by mouth 2 (two) times daily.    Marland Kitchen albuterol (PROVENTIL HFA;VENTOLIN HFA) 108 (90 Base) MCG/ACT inhaler Inhale  2 puffs into the lungs every 6 (six) hours as needed for wheezing or shortness of breath. 1 Inhaler 2  . aspirin EC 81 MG tablet Take 81 mg by mouth daily.    Marland Kitchen diltiazem (CARDIZEM) 120 MG tablet TAKE ONE TABLET BY MOUTH ONCE DAILY 30 tablet 11  . ferrous sulfate 325 (65 FE) MG tablet Take 1 tablet (325 mg total) by mouth 2 (two) times daily with a meal. 60 tablet 3  . furosemide (LASIX) 80 MG tablet Take 1 tablet (80 mg total) by mouth daily. (Patient taking differently: Take 80 mg by mouth 2 (two) times daily. )    . hydrALAZINE (APRESOLINE) 50 MG tablet TAKE ONE TABLET BY MOUTH TWICE DAILY 180 tablet 2  . ipratropium (ATROVENT HFA) 17 MCG/ACT inhaler Inhale 2 puffs into the lungs every 6 (six) hours as needed for wheezing.     . lidocaine-prilocaine (EMLA) cream Apply to affected area once (Patient taking differently: Apply 1 application topically once.  Apply to affected area once) 30 g 3  . lisinopril (PRINIVIL,ZESTRIL) 20 MG tablet TAKE ONE TABLET BY MOUTH ONCE DAILY 30 tablet 10  . metoprolol tartrate (LOPRESSOR) 25 MG tablet TAKE ONE TABLET BY MOUTH TWICE DAILY 180 tablet 2  . potassium chloride SA (K-DUR,KLOR-CON) 20 MEQ tablet TAKE TWO TABLETS BY MOUTH ONCE DAILY 180 tablet 0  . docusate sodium (COLACE) 100 MG capsule Take 1 capsule (100 mg total) by mouth 2 (two) times daily. For constipation (Patient not taking: Reported on 02/14/2017) 60 capsule 3  . sildenafil (REVATIO) 20 MG tablet Take 2-3 tablets (40-60 mg total) by mouth daily. 50 tablet 0  . warfarin (COUMADIN) 4 MG tablet Take 1 to 2 tablets by mouth every day as directed by coumadin clinic (Patient taking differently: Take 8 mg by mouth at bedtime. ) 60 tablet 1    Results for orders placed or performed during the hospital encounter of 02/15/17 (from the past 48 hour(s))  CBC     Status: Abnormal   Collection Time: 02/15/17  2:10 PM  Result Value Ref Range   WBC 6.5 4.0 - 10.5 K/uL   RBC 5.54 4.22 - 5.81 MIL/uL   Hemoglobin 14.9 13.0 - 17.0 g/dL   HCT 45.0 39.0 - 52.0 %   MCV 81.2 78.0 - 100.0 fL   MCH 26.9 26.0 - 34.0 pg   MCHC 33.1 30.0 - 36.0 g/dL   RDW 17.1 (H) 11.5 - 15.5 %   Platelets 154 150 - 400 K/uL  Comprehensive metabolic panel     Status: Abnormal   Collection Time: 02/15/17  2:10 PM  Result Value Ref Range   Sodium 137 135 - 145 mmol/L   Potassium 4.4 3.5 - 5.1 mmol/L   Chloride 102 101 - 111 mmol/L   CO2 29 22 - 32 mmol/L   Glucose, Bld 85 65 - 99 mg/dL   BUN 10 6 - 20 mg/dL   Creatinine, Ser 0.95 0.61 - 1.24 mg/dL   Calcium 8.9 8.9 - 10.3 mg/dL   Total Protein 7.4 6.5 - 8.1 g/dL   Albumin 3.8 3.5 - 5.0 g/dL   AST 32 15 - 41 U/L   ALT 20 17 - 63 U/L   Alkaline Phosphatase 92 38 - 126 U/L   Total Bilirubin 1.8 (H) 0.3 - 1.2 mg/dL   GFR calc non Af Amer >60 >60 mL/min   GFR calc Af Amer >60 >60 mL/min    Comment: (NOTE) The eGFR has  been  calculated using the CKD EPI equation. This calculation has not been validated in all clinical situations. eGFR's persistently <60 mL/min signify possible Chronic Kidney Disease.    Anion gap 6 5 - 15   No results found.  Review of Systems  All other systems reviewed and are negative.   Blood pressure 129/89, pulse 70, temperature 97.9 F (36.6 C), temperature source Oral, resp. rate 18, height 5' 10"  (1.778 m), weight 117 kg (258 lb), SpO2 97 %. Physical Exam  Constitutional: He is oriented to person, place, and time. He appears well-developed and well-nourished. No distress.  HENT:  Head: Normocephalic and atraumatic.  Eyes: Pupils are equal, round, and reactive to light. Conjunctivae are normal.  Neck: Normal range of motion.  Cardiovascular: Normal rate and regular rhythm.   Respiratory: Effort normal. No respiratory distress.  Left port site intact.  GI: Soft.  Musculoskeletal: Normal range of motion.  Neurological: He is alert and oriented to person, place, and time.  Skin: Skin is warm and dry. He is not diaphoretic.  Psychiatric: He has a normal mood and affect. His behavior is normal. Judgment and thought content normal.     Assessment/Plan Colon cancer, port in place.  Plan port removal.   Discussed risks, benefits.    [name], MD 02/16/2017, 9:57 AM

## 2017-02-16 NOTE — Transfer of Care (Signed)
Immediate Anesthesia Transfer of Care Note  Patient: AMADOU KATZENSTEIN  Procedure(s) Performed: Procedure(s): REMOVAL PORT-A-CATH (N/A)  Patient Location: PACU  Anesthesia Type:General  Level of Consciousness: drowsy, patient cooperative and responds to stimulation  Airway & Oxygen Therapy: Patient Spontanous Breathing and Patient connected to nasal cannula oxygen  Post-op Assessment: Report given to RN and Post -op Vital signs reviewed and stable  Post vital signs: Reviewed and stable  Last Vitals:  Vitals:   02/16/17 0950  BP: 129/89  Pulse: 70  Resp: 18  Temp: 36.6 C  SpO2: 97%    Last Pain:  Vitals:   02/16/17 0950  TempSrc: Oral      Patients Stated Pain Goal: 3 (51/89/84 2103)  Complications: No apparent anesthesia complications

## 2017-02-16 NOTE — Op Note (Signed)
  PRE-OPERATIVE DIAGNOSIS:  un-needed Port-A-Cath for right colon cancer  POST-OPERATIVE DIAGNOSIS:  Same   PROCEDURE:  Procedure(s):  REMOVAL PORT-A-CATH  SURGEON:  Surgeon(s):  [name], MD  ANESTHESIA:   MAC + local  EBL:   Minimal  SPECIMEN:  None  FINDINGS:  Port intact without signs of breakage.  Complications : none known  Procedure:   Pt was  identified in the holding area and taken to the operating room where she was placed supine on the operating room table.  MAC anesthesia was induced.  The left upper chest was prepped and draped.  The prior incision was anesthetized with local anesthetic.  The incision was opened with a #15 blade.  The subcutaneous tissue was divided with the cautery.  The port was identified and the capsule opened.  The four 2-0 prolene sutures were removed.  The port was then removed and pressure held on the tract.  The catheter appeared intact without evidence of breakage, length was 27.5 cm.  The wound was inspected for hemostasis, which was achieved with cautery.  The wound was closed with 3-0 vicryl deep dermal interrupted sutures and 4-0 Monocryl running subcuticular suture.  The wound was cleaned, dried, and dressed with dermabond.  The patient was awakened from anesthesia and taken to the PACU in stable condition.  Needle, sponge, and instrument counts are correct.

## 2017-02-16 NOTE — Progress Notes (Signed)
Report given to jamie rn as caregiver 

## 2017-02-16 NOTE — Anesthesia Postprocedure Evaluation (Signed)
Anesthesia Post Note  Patient: Steven Barnett  Procedure(s) Performed: Procedure(s) (LRB): REMOVAL PORT-A-CATH (N/A)     Patient location during evaluation: PACU Anesthesia Type: MAC Level of consciousness: awake and alert Pain management: pain level controlled Vital Signs Assessment: post-procedure vital signs reviewed and stable Respiratory status: spontaneous breathing, nonlabored ventilation, respiratory function stable and patient connected to nasal cannula oxygen Cardiovascular status: stable and blood pressure returned to baseline Anesthetic complications: no    Last Vitals:  Vitals:   02/16/17 1145 02/16/17 1200  BP: 98/72 94/63  Pulse: (!) 41 (!) 48  Resp: (!) 21 18  Temp:  36.7 C  SpO2: 96% 99%                  Effie Berkshire

## 2017-02-16 NOTE — Discharge Instructions (Signed)
[facility] °Office Phone Number [phone] ° ° POST OP INSTRUCTIONS ° °Always review your discharge instruction sheet given to you by the facility where your surgery was performed. ° °IF YOU HAVE DISABILITY OR FAMILY LEAVE FORMS, YOU MUST BRING THEM TO THE OFFICE FOR PROCESSING.  DO NOT GIVE THEM TO YOUR DOCTOR. ° °1. A prescription for pain medication may be given to you upon discharge.  Take your pain medication as prescribed, if needed.  If narcotic pain medicine is not needed, then you may take acetaminophen (Tylenol) or ibuprofen (Advil) as needed. °2. Take your usually prescribed medications unless otherwise directed °3. If you need a refill on your pain medication, please contact your pharmacy.  They will contact our office to request authorization.  Prescriptions will not be filled after 5pm or on week-ends. °4. You should eat very light the first 24 hours after surgery, such as soup, crackers, pudding, etc.  Resume your normal diet the day after surgery °5. It is common to experience some constipation if taking pain medication after surgery.  Increasing fluid intake and taking a stool softener will usually help or prevent this problem from occurring.  A mild laxative (Milk of Magnesia or Miralax) should be taken according to package directions if there are no bowel movements after 48 hours. °6. You may shower in 48 hours.  The surgical glue will flake off in 2-3 weeks.   °7. ACTIVITIES:  No strenuous activity or heavy lifting for 1 week.   °a. You may drive when you no longer are taking prescription pain medication, you can comfortably wear a seatbelt, and you can safely maneuver your car and apply brakes. °b. RETURN TO WORK:  __________1 week if applicable_______________ °You should see your doctor in the office for a follow-up appointment approximately three-four weeks after your surgery.   ° °WHEN TO CALL YOUR DOCTOR: °1. Fever over 101.0 °2. Nausea and/or vomiting. °3. Extreme  swelling or bruising. °4. Continued bleeding from incision. °5. Increased pain, redness, or drainage from the incision. ° °The clinic staff is available to answer your questions during regular business hours.  Please don’t hesitate to call and ask to speak to one of the nurses for clinical concerns.  If you have a medical emergency, go to the nearest emergency room or call 911.  A surgeon from [facility] is always on call at the hospital. ° °For further questions, please visit [email or web address]  ° °

## 2017-02-16 NOTE — Anesthesia Procedure Notes (Signed)
Procedure Name: MAC Date/Time: 02/16/2017 10:16 AM Performed by: Lance Coon Pre-anesthesia Checklist: Patient identified, Emergency Drugs available, Suction available, Patient being monitored and Timeout performed Patient Re-evaluated:Patient Re-evaluated prior to induction Oxygen Delivery Method: Nasal cannula

## 2017-02-16 NOTE — Anesthesia Preprocedure Evaluation (Signed)
Anesthesia Evaluation  Patient identified by MRN, date of birth, ID band Patient awake    Reviewed: Allergy & Precautions, NPO status , Patient's Chart, lab work & pertinent test results  Airway Mallampati: III  TM Distance: >3 FB Neck ROM: Full    Dental  (+) Teeth Intact, Dental Advisory Given   Pulmonary sleep apnea , former smoker,    breath sounds clear to auscultation       Cardiovascular hypertension, Pt. on medications and Pt. on home beta blockers +CHF  + dysrhythmias  Rhythm:Irregular Rate:Normal     Neuro/Psych negative neurological ROS  negative psych ROS   GI/Hepatic negative GI ROS, Neg liver ROS,   Endo/Other  negative endocrine ROS  Renal/GU Renal disease     Musculoskeletal  (+) Arthritis , Osteoarthritis,    Abdominal   Peds  Hematology negative hematology ROS (+)   Anesthesia Other Findings Day of surgery medications reviewed with the patient.  Reproductive/Obstetrics                             Lab Results  Component Value Date   WBC 6.5 02/15/2017   HGB 14.9 02/15/2017   HCT 45.0 02/15/2017   MCV 81.2 02/15/2017   PLT 154 02/15/2017   Lab Results  Component Value Date   CREATININE 0.95 02/15/2017   BUN 10 02/15/2017   NA 137 02/15/2017   K 4.4 02/15/2017   CL 102 02/15/2017   CO2 29 02/15/2017   EKG: Atrial Fibrillation.  Echo: - Left ventricle: The cavity size was normal. Wall thickness was   increased in a pattern of moderate LVH. Systolic function was   normal. The estimated ejection fraction was in the range of 55%   to 60%. Wall motion was normal; there were no regional wall   motion abnormalities. The study is not technically sufficient to   allow evaluation of LV diastolic function. - Aortic valve: Trileaflet. Sclerosis without stenosis. There was   no regurgitation. - Mitral valve: There was mild regurgitation. - Left atrium: Moderately  dilated at 45 ml/m2. - Right ventricle: The cavity size was mildly dilated. - Right atrium: Moderate to severely dilated. - Inferior vena cava: The vessel was normal in size. The   respirophasic diameter changes were in the normal range (>= 50%),   consistent with normal central venous pressure.  Anesthesia Physical Anesthesia Plan  ASA: III  Anesthesia Plan: MAC   Post-op Pain Management:    Induction:   PONV Risk Score and Plan: Propofol infusion  Airway Management Planned: Natural Airway  Additional Equipment:   Intra-op Plan:   Post-operative Plan:   Informed Consent: I have reviewed the patients History and Physical, chart, labs and discussed the procedure including the risks, benefits and alternatives for the proposed anesthesia with the patient or authorized representative who has indicated his/her understanding and acceptance.     Plan Discussed with: CRNA  Anesthesia Plan Comments:         Anesthesia Quick Evaluation

## 2017-02-17 ENCOUNTER — Encounter (HOSPITAL_COMMUNITY): Payer: Self-pay | Admitting: General Surgery

## 2017-04-16 ENCOUNTER — Other Ambulatory Visit: Payer: Self-pay | Admitting: Physician Assistant

## 2017-04-17 NOTE — Telephone Encounter (Signed)
Please review for refill. Thanks!  

## 2017-04-25 ENCOUNTER — Ambulatory Visit: Payer: Medicare Other

## 2017-04-25 ENCOUNTER — Encounter: Payer: Self-pay | Admitting: Hematology

## 2017-04-25 ENCOUNTER — Ambulatory Visit (HOSPITAL_BASED_OUTPATIENT_CLINIC_OR_DEPARTMENT_OTHER): Payer: Medicare Other | Admitting: Hematology

## 2017-04-25 ENCOUNTER — Other Ambulatory Visit (HOSPITAL_BASED_OUTPATIENT_CLINIC_OR_DEPARTMENT_OTHER): Payer: Medicare Other

## 2017-04-25 VITALS — BP 134/80 | HR 62 | Temp 97.7°F | Resp 20 | Ht 70.0 in | Wt 264.9 lb

## 2017-04-25 DIAGNOSIS — E119 Type 2 diabetes mellitus without complications: Secondary | ICD-10-CM | POA: Diagnosis not present

## 2017-04-25 DIAGNOSIS — D509 Iron deficiency anemia, unspecified: Secondary | ICD-10-CM

## 2017-04-25 DIAGNOSIS — R05 Cough: Secondary | ICD-10-CM | POA: Diagnosis not present

## 2017-04-25 DIAGNOSIS — C182 Malignant neoplasm of ascending colon: Secondary | ICD-10-CM

## 2017-04-25 DIAGNOSIS — S40021A Contusion of right upper arm, initial encounter: Secondary | ICD-10-CM

## 2017-04-25 DIAGNOSIS — E669 Obesity, unspecified: Secondary | ICD-10-CM

## 2017-04-25 DIAGNOSIS — G4700 Insomnia, unspecified: Secondary | ICD-10-CM | POA: Diagnosis not present

## 2017-04-25 DIAGNOSIS — E876 Hypokalemia: Secondary | ICD-10-CM

## 2017-04-25 DIAGNOSIS — Z23 Encounter for immunization: Secondary | ICD-10-CM

## 2017-04-25 DIAGNOSIS — I4891 Unspecified atrial fibrillation: Secondary | ICD-10-CM

## 2017-04-25 DIAGNOSIS — C180 Malignant neoplasm of cecum: Secondary | ICD-10-CM

## 2017-04-25 DIAGNOSIS — I10 Essential (primary) hypertension: Secondary | ICD-10-CM

## 2017-04-25 LAB — COMPREHENSIVE METABOLIC PANEL
ALT: 22 U/L (ref 0–55)
AST: 26 U/L (ref 5–34)
Albumin: 3.8 g/dL (ref 3.5–5.0)
Alkaline Phosphatase: 115 U/L (ref 40–150)
Anion Gap: 9 mEq/L (ref 3–11)
BUN: 8.1 mg/dL (ref 7.0–26.0)
CALCIUM: 9.5 mg/dL (ref 8.4–10.4)
CHLORIDE: 101 meq/L (ref 98–109)
CO2: 28 meq/L (ref 22–29)
Creatinine: 0.9 mg/dL (ref 0.7–1.3)
EGFR: >60 mL/min/{1.73_m2} (ref >60)
Glucose: 76 mg/dl (ref 70–140)
POTASSIUM: 5.3 meq/L — AB (ref 3.5–5.1)
Sodium: 138 mEq/L (ref 136–145)
Total Bilirubin: 0.96 mg/dL (ref 0.20–1.20)
Total Protein: 8 g/dL (ref 6.4–8.3)

## 2017-04-25 LAB — CBC WITH DIFFERENTIAL/PLATELET
BASO%: 1.2 % (ref 0.0–2.0)
BASOS ABS: 0.1 10*3/uL (ref 0.0–0.1)
EOS%: 1.8 % (ref 0.0–7.0)
Eosinophils Absolute: 0.1 10*3/uL (ref 0.0–0.5)
HEMATOCRIT: 49.9 % (ref 38.4–49.9)
HGB: 16.1 g/dL (ref 13.0–17.1)
LYMPH#: 1.1 10*3/uL (ref 0.9–3.3)
LYMPH%: 19.4 % (ref 14.0–49.0)
MCH: 27 pg — AB (ref 27.2–33.4)
MCHC: 32.2 g/dL (ref 32.0–36.0)
MCV: 83.9 fL (ref 79.3–98.0)
MONO#: 0.5 10*3/uL (ref 0.1–0.9)
MONO%: 9.7 % (ref 0.0–14.0)
NEUT#: 3.8 10*3/uL (ref 1.5–6.5)
NEUT%: 67.9 % (ref 39.0–75.0)
PLATELETS: 189 10*3/uL (ref 140–400)
RBC: 5.94 10*6/uL — AB (ref 4.20–5.82)
RDW: 15.5 % — ABNORMAL HIGH (ref 11.0–14.6)
WBC: 5.6 10*3/uL (ref 4.0–10.3)

## 2017-04-25 LAB — IRON AND TIBC
%SAT: 29 % (ref 20–55)
IRON: 88 ug/dL (ref 42–163)
TIBC: 300 ug/dL (ref 202–409)
UIBC: 212 ug/dL (ref 117–376)

## 2017-04-25 LAB — FERRITIN: Ferritin: 203 ng/ml (ref 22–316)

## 2017-04-25 LAB — CEA (IN HOUSE-CHCC): CEA (CHCC-IN HOUSE): 5.16 ng/mL — AB (ref 0.00–5.00)

## 2017-04-25 MED ORDER — SODIUM CHLORIDE 0.9% FLUSH
10.0000 mL | INTRAVENOUS | Status: DC | PRN
  Filled 2017-04-25: qty 10

## 2017-04-25 MED ORDER — INFLUENZA VAC SPLIT HIGH-DOSE 0.5 ML IM SUSY
0.5000 mL | PREFILLED_SYRINGE | INTRAMUSCULAR | Status: AC
Start: 2017-04-26 — End: 2017-04-25
  Administered 2017-04-25: 0.5 mL via INTRAMUSCULAR
  Filled 2017-04-25: qty 0.5

## 2017-04-25 MED ORDER — HEPARIN SOD (PORK) LOCK FLUSH 100 UNIT/ML IV SOLN
500.0000 [IU] | Freq: Once | INTRAVENOUS | Status: DC | PRN
  Filled 2017-04-25: qty 5

## 2017-04-25 NOTE — Progress Notes (Addendum)
[place]  [phone] [phone]  Clinic Follow up Note   Patient Care Team: [name], [name] as PCP - General (Family Medicine) [name], MD as Consulting Physician (General Surgery) 04/25/2017   CHIEF COMPLAINTS:  Follow up stage III colon cancer  Oncology History   Cancer of right colon [facility])   Staging form: Colon and Rectum, AJCC 7th Edition   - Clinical stage from 04/12/2016: Stage IIIA (T2, N1, M0) - Signed by [name], MD on 05/01/2016      Cancer of right colon ([place])   03/04/2016 Procedure    COLONOSCOPY: A polypoid and ulcerated non-obstructing large mass was found in the cecum. The mass was noncircumferential. The mass measured three cm in length. In addition, its diameter measured three mm. (Dr. [name])       03/15/2016 Imaging    1. Polypoid cecal mass. No findings of liver metastatic disease or other definite metastatic disease. There are some small adjacent pericecal lymph nodes which are not pathologically enlarged. 2. Adenopathy in the chest is present but was also present in 2010, albeit slightly less prominent. This may be reactive adenopathy related to the chronic exudative right pleural effusion.       04/07/2016 Tumor Marker    Patient's tumor was tested for the following markers: CEA. Results of the tumor marker test revealed 2.7.      04/12/2016 Initial Diagnosis    Cecal cancer ([place])     04/12/2016 Definitive Surgery    Laparoscopic right hemicolectomy (Dr. [name])      04/12/2016 Pathologic Stage    pT2 pN1 pMX--Grade 3 adenocarcinoma with 2/16 nodes positive, clear margins, negative for perineural or lymphvascular invasion; invades muscularis propria Loss of expression MLH1/PMS2      06/02/2016 - 09/13/2016 Adjuvant Chemotherapy     Ajuvant chemotherapy FOLFOX, every 2 weeks for 6 cycles (3 months), last cycle postponed due to hospitalization       06/29/2016 Genetic Testing    POLE  [id number] VUS identified on the Colorectal cancer panel.  Negative genetic testing for the MSH2 inversion analysis ([place] inversion). The Colorectal Cancer Panel offered by [facility] includes sequencing and/or duplication/deletion testing of the following 19 genes: APC, ATM, AXIN2, BMPR1A, CDH1, CHEK2, EPCAM, MLH1, MSH2, MSH6, MUTYH, PMS2, POLD1, POLE, PTEN, SCG5/GREM1, SMAD4, STK11, and TP53. The report date is 06/22/2016 for the [facility] panel and 06/29/2016 for the [place] inversion.      08/17/2016 - 08/20/2016 Hospital Admission    Admit date: 08/17/2016 Admission diagnosis: Acute respiratory failure with hypoxia  Additional comments: Patient was admitted initially with hypoxia and found to have H influenza positive-started on Tamiflu and broad-spectrum antibiotics were D escalate. He was hypotensive on admission given IV saline 75 cc per hour saline lock 2/16 and antihypertensives which were held including lisinopril and Lasix and hydralazine on discharge. Joint hospitalization he continued on his metoprolol twice a day      01/20/2017 Imaging    CT CAP W Contrast 01/20/17 IMPRESSION: 1. No evidence of local tumor recurrence at the ileocolic anastomosis . 2. No findings suspicious for metastatic disease in the chest, abdomen or pelvis. 3. Mild mediastinal lymphadenopathy is stable since 2010 and considered benign . 4. Stable morphologic changes suggestive of cirrhosis. 5. Stable patulous fluid-filled thoracic esophagus. Patchy ground-glass opacity in the posterior right upper lobe is probably inflammatory, possibly due to aspiration. 6. Stable chronic small loculated dependent right pleural effusion.  HISTORY OF PRESENTING ILLNESS (04/27/2016):  Steven Barnett 66 y.o. male is here because of his recently diagnosed stage III colon cancer. She is accompanied by her wife to my clinic today.  This was discovered by screening colonoscopy in 03/2016. At the time of his screening colonoscopy,  he was found to have anemia and iron deficiency. He has had mild to moderate fatigue and dyspnea on exertion for several months. Colonoscopy showed a large no obstructive mass in the cecum, biopsy showed adenocarcinoma. He was referred to see surgeon Dr. [name] and underwent right hemicolectomy on 04/12/2016. He has been recovering slowly from surgery, still has mild to moderate pain at the incision, his energy level and appetite has not recovered well.   He was hospitalized for CHF in 09/2015 and was found to have pleural effusion, he is on lasix. He is able to do all his ADLs, and light activities, such as house work. He is not very physically active.    CURRENT THERAPY: Surveillance   INTERIM HISTORY:  Steven Barnett returns for follow-up with his spouse as scheduled. He denies any changes in his health from last visit 3 months ago. He had his Port-A-Cath removed per Dr. [name] on 08/16/2018without difficulty. He denies decreased appetite, weight loss, changes in bowel habits, abdominal pain, or blood in stool. He noticed a large bruise on his right upper arm a few days ago denies injury, he is on Coumadin. Denies recent colonoscopy.   MEDICAL HISTORY:  Past Medical History:  Diagnosis Date  . Anemia   . Arthralgia of both knees   . Arthritis    "right knee" (10/01/2015)  . Atrial fibrillation ([place])   . Cancer (HCC)    STAGE 1 COLON CANCER  . Chronic anticoagulation 2010   Coumadin  . Chronic diastolic CHF (congestive heart failure), NYHA class 2 ([place])   . Dysrhythmia   . Hypertension   . OSA (obstructive sleep apnea) 2016   "couldn't take the mask during the testing" (10/01/2015)  . Permanent atrial fibrillation ([place]) 2010  . Pneumonia 3/29/2017and june 2017    SURGICAL HISTORY: Past Surgical History:  Procedure Laterality Date  . COLONOSCOPY WITH PROPOFOL N/A 03/04/2016   Procedure: COLONOSCOPY WITH PROPOFOL;  Surgeon: Carol Ada, MD;  Location: [facility];  Service: Endoscopy;   Laterality: N/A;  . LAPAROSCOPIC PARTIAL COLECTOMY N/A 04/12/2016   Procedure: LAPAROSCOPIC ILEOCOLECTOMY;  Surgeon: [name], MD;  Location: [place];  Service: General;  Laterality: N/A;  . PORT-A-CATH REMOVAL N/A 02/16/2017   Procedure: REMOVAL PORT-A-CATH;  Surgeon: [name], MD;  Location: [place];  Service: General;  Laterality: N/A;  . PORTACATH PLACEMENT N/A 05/18/2016   Procedure: INSERTION PORT-A-CATH;  Surgeon: [name], MD;  Location: [place];  Service: General;  Laterality: N/A;  . THORACOTOMY Right 2010    SOCIAL HISTORY: Social History   Social History  . Marital status: Married    Spouse name: Ronnette Juniper  . Number of children: N/A  . Years of education: N/A   Occupational History  . Not on file.   Social History Main Topics  . Smoking status: Former Smoker    Packs/day: 0.10    Years: 2.00    Types: Cigarettes    Quit date: 12/16/1974  . Smokeless tobacco: Never Used     Comment: "1 pack cigarettes would last me a month"  . Alcohol use 0.0 oz/week     Comment: 10/01/2015 "might have1 6 pack of beer/year"  . Drug  use: Yes    Types: Cocaine, Marijuana     Comment: Hx polysubstance abuse, quit 2008  . Sexual activity: Not Currently   Other Topics Concern  . 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   FAMILY HISTORY: Family History  Problem Relation Age of Onset  . Heart disease Father   . Heart failure Father   . Colon polyps Father        unspecified number - pt of intestine surgically resected  . Hypertension Mother   . Diabetes Mother   . Hyperlipidemia Mother   . Colon polyps Mother        unspecified number - pt of intestine surgically resected  . Dementia Mother        d. 13  . Stroke Maternal Grandmother   . Stroke Sister   . Colon cancer Sister 65       w/ "cancerous polyps" - unspecified number; underwent surgery and radiation  .  Colon polyps Brother        unspecified number - polypectomies  . Colon cancer Sister        dx 72-60; s/p surgery and radiation  . Bone cancer Maternal Uncle        dx. 64s  . Lung cancer Paternal Grandfather        d. 80s-90s; lung cancer vs TB  . Colon cancer Sister        dx. 66-60; s/p surgery and radiation  . Cancer Maternal Uncle        mother had about 7-8 other siblings, most passed at older ages, many of whom had some type of cancer  . Heart attack Neg Hx     ALLERGIES:  is allergic to no known allergies.  MEDICATIONS:  Current Outpatient Prescriptions  Medication Sig Dispense Refill  . acetaminophen (TYLENOL) 500 MG tablet Take 1,000 mg by mouth 2 (two) times daily.    Marland Kitchen albuterol (PROVENTIL HFA;VENTOLIN HFA) 108 (90 Base) MCG/ACT inhaler Inhale 2 puffs into the lungs every 6 (six) hours as needed for wheezing or shortness of breath. 1 Inhaler 2  . aspirin EC 81 MG tablet Take 81 mg by mouth daily.    Marland Kitchen diltiazem (CARDIZEM) 120 MG tablet TAKE ONE TABLET BY MOUTH ONCE DAILY 30 tablet 11  . ferrous sulfate 325 (65 FE) MG tablet Take 1 tablet (325 mg total) by mouth 2 (two) times daily with a meal. 60 tablet 3  . furosemide (LASIX) 80 MG tablet Take 1 tablet (80 mg total) by mouth daily. (Patient taking differently: Take 80 mg by mouth 2 (two) times daily. )    . hydrALAZINE (APRESOLINE) 50 MG tablet TAKE ONE TABLET BY MOUTH TWICE DAILY 180 tablet 2  . ipratropium (ATROVENT HFA) 17 MCG/ACT inhaler Inhale 2 puffs into the lungs every 6 (six) hours as needed for wheezing.     Marland Kitchen KLOR-CON M20 20 MEQ tablet TAKE 2 TABLETS BY MOUTH ONCE DAILY 180 tablet 3  . lisinopril (PRINIVIL,ZESTRIL) 20 MG tablet TAKE ONE TABLET BY MOUTH ONCE DAILY 30 tablet 10  . metoprolol tartrate (LOPRESSOR) 25 MG tablet TAKE ONE TABLET BY MOUTH TWICE DAILY 180 tablet 2  . sildenafil (REVATIO) 20 MG tablet Take 2-3 tablets (40-60 mg total) by mouth daily. 50 tablet 0  . warfarin (COUMADIN) 4 MG tablet Take 1  to 2 tablets by mouth every day as directed  by coumadin clinic (Patient taking differently: Take 8 mg by mouth at bedtime. ) 60 tablet 1  . lidocaine-prilocaine (EMLA) cream Apply to affected area once (Patient taking differently: Apply 1 application topically once. Apply to affected area once) 30 g 3  . oxyCODONE (OXY IR/ROXICODONE) 5 MG immediate release tablet Take 1-2 tablets (5-10 mg total) by mouth every 6 (six) hours as needed for moderate pain, severe pain or breakthrough pain. 15 tablet 0   No current facility-administered medications for this visit.     REVIEW OF SYSTEMS:  Constitutional: Denies fatigue, fevers, chills or abnormal night sweats (+) normal appetite Eyes: Denies double vision or watery eyes  Ears, nose, mouth, throat, and face: Denies mucositis or sore throat Respiratory: Denies dyspnea or wheezes (+) dry cough unchanged from baseline  Cardiovascular: Denies palpitation, chest discomfort or lower extremity swelling, Gastrointestinal:  Denies nausea, vomiting, constipation, diarrhea, heartburn, or change in bowel habits. Denies rectal bleeding or blood in stool Skin: Denies abnormal skin rashes (+) darkness in fingertips improving, nearly resolved Lymphatics: Denies new lymphadenopathy or easy bruising (+) large bruised her right upper arm Musculoskeletal: (+) arthritis to right hand Neurological: Denies numbness, tingling or new weakness Behavioral/Psych: Mood is stable, no new changes  All other systems were reviewed with the patient and are negative.  PHYSICAL EXAMINATION: ECOG PERFORMANCE STATUS: 1  Vitals:   04/25/17 1352 04/25/17 1406  BP:  134/80  Pulse: 62   Resp: 20   Temp: 97.7 F (36.5 C)   SpO2: 99%    Filed Weights   04/25/17 1352  Weight: 264 lb 14.4 oz (120.2 kg)     GENERAL:alert, no distress and comfortable SKIN: skin color, texture, turgor are normal, no rashes. Large nontender hematoma to right upper posterior arm EYES: normal,  conjunctiva are pink and non-injected, sclera clear OROPHARYNX:no exudate, no erythema and lips, buccal mucosa, and tongue normal  NECK: supple, thyroid normal size, non-tender, without nodularity LYMPH:  no palpable cervical, supraclavicular, axillary, or inguinal lymphadenopathy  LUNGS: clear to auscultation bilaterally with normal breathing effort HEART: regular rate & rhythm, no murmurs and no lower extremity edema ABDOMEN:abdomen soft, non-tender and normal bowel sounds. (+) midline incision well-healed, no discharge Musculoskeletal:no cyanosis of digits and no clubbing  PSYCH: alert & oriented x 3 with fluent speech NEURO: no focal motor/sensory deficits PAC removed; site is well healed  LABORATORY DATA:  I have reviewed the data as listed CBC Latest Ref Rng & Units 04/25/2017 02/15/2017 01/16/2017  WBC 4.0 - 10.3 10e3/uL 5.6 6.5 7.3  Hemoglobin 13.0 - 17.1 g/dL 16.1 14.9 15.9  Hematocrit 38.4 - 49.9 % 49.9 45.0 49.3  Platelets 140 - 400 10e3/uL 189 154 159   CMP Latest Ref Rng & Units 04/25/2017 02/15/2017 01/16/2017  Glucose 70 - 140 mg/dl 76 85 101  BUN 7.0 - 26.0 mg/dL 8.1 10 12.7  Creatinine 0.7 - 1.3 mg/dL 0.9 0.95 1.0  Sodium 136 - 145 mEq/L 138 137 136  Potassium 3.5 - 5.1 mEq/L 5.3(H) 4.4 4.1  Chloride 101 - 111 mmol/L - 102 -  CO2 22 - 29 mEq/L 28 29 28   Calcium 8.4 - 10.4 mg/dL 9.5 8.9 9.6  Total Protein 6.4 - 8.3 g/dL 8.0 7.4 7.6  Total Bilirubin 0.20 - 1.20 mg/dL 0.96 1.8(H) 1.59(H)  Alkaline Phos 40 - 150 U/L 115 92 104  AST 5 - 34 U/L 26 32 17  ALT 0 - 55 U/L 22 20 11     CEA: 04/07/16:  2.7 04/27/2016: 2.11 07/14/2016: 4.25 10/21/2016: 3.60 01/16/17: 3.76 04/25/17: 5.16  PATHOLOGY  Diagnosis 04/12/2016 Colon, segmental resection for tumor, Right and Terminal Ileum HIGH GRADE ADENOCARCINOMA OF THE CECUM (3 CM), GRADE 3 THE CARCINOMA INVADES MUSCULARIS PROPRIA (PT2) ALL MARGINS OF RESECTION ARE NEGATIVE FOR TUMOR METASTATIC ADENOCARCINOMA IN TWO OF SIXTEEN  LYMPH NODES (2/16) UNREMARKABLE APPENDIX   MMR: (+) loss of MLH1 and PMS2  MSI-H, MLH-1 hypermethylation presents, BRAF mutation (-)  RADIOGRAPHIC STUDIES: I have personally reviewed the radiological images as listed and agreed with the findings in the report.  CT CAP W Contrast 01/20/17 IMPRESSION: 1. No evidence of local tumor recurrence at the ileocolic anastomosis . 2. No findings suspicious for metastatic disease in the chest, abdomen or pelvis. 3. Mild mediastinal lymphadenopathy is stable since 2010 and considered benign . 4. Stable morphologic changes suggestive of cirrhosis. 5. Stable patulous fluid-filled thoracic esophagus. Patchy ground-glass opacity in the posterior right upper lobe is probably inflammatory, possibly due to aspiration. 6. Stable chronic small loculated dependent right pleural effusion.  Renal US 08/07/2016 IMPRESSION: Bilateral nonobstructive nephrolithiasis. No hydronephrosis is noted.  CT chest, abdomen and pelvis 03/15/2016 IMPRESSION: 1. Polypoid cecal mass. No findings of liver metastatic disease or other definite metastatic disease. There are some small adjacent pericecal lymph nodes which are not pathologically enlarged. 2. Adenopathy in the chest is present but was also present in 2010, albeit slightly less prominent. This may be reactive adenopathy related to the chronic exudative right pleural effusion. 3. Prominent dilatation of the esophagus, nonspecific. Causes might include achalasia, neuropathy, scleroderma, pseudoachalasia, stricture, presbyesophagus, or Chagas disease. 4. Moderate 4 chamber cardiomegaly. 5. Liver morphology is suggestive but not absolutely diagnostic for early cirrhosis. 6.  Prominent stool throughout the colon favors constipation. 7. Left iliopsoas bursitis. 8. Possible varicoceles in the scrotum bilaterally. 9. Impingement at L4-5 due to spondylosis and degenerative disc Disease.  PROCEDURES    Colonoscopy 03/04/2016 Dr. [name]  A polypoid and ulcerated non-obstructing large mass was found in the cecum. The mass was noncircumferential. The mass measured three cm in length. In addition, its diameter measured three mm. No bleeding was present. This was biopsied with a cold forceps for histology. Findings: A 2 mm polyp was found in the transverse colon. The polyp was sessile. The polyp was removed with a cold biopsy forceps. Resection and retrieval were complete. A small ascending colon polyp was identified, image #3, near the mass. This polyp was not removed as it is anticipated that it will be removed with the mass surgically. - Likely malignant tumor in the cecum. Biopsied. - One 2 mm polyp in the transverse colon, removed with a cold biopsy forceps. Resected and retrieved.  ASSESSMENT & PLAN:  66 y.o. male, with past medical history of hypertension, OSA, CHF, obesity, presented with fatigue, dyspnea on exertion, and anemia. Screening colonoscopy showed a cecal mass.  1. Right colon cancer, invasive adenocarcinoma,G3,  pT2N1M0, stage IIIA, MSI-H -CT scan findings and surgical pathology results have previously been reviewed in great details with patient and his family members -He had a complete surgical resection -His tumor has MSI high, but ML H1 hypermethylation present, supports sporadic colon cancer, 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 -We previously discussed the risk of cancer recurrence after surgery. Due to his stage IIIA disease, he is at moderate to high risk of recurrence -We previously discussed the standard adjuvant chemotherapy for stage III colon cancer,  reduce the risk of cancer recurrence. -He has completed adjuvant chemotherapy FOLFOX for  3 months, and last cycle chemo was postponed  -CT scan from 01/20/17 reviewed previously; shows no evidence of cancer recurrence. Patchy ground-glass opacity in the posterior right upper lobe is probably inflammatory, not very suspicious for  metastasis, We'll continue follow up.   -PAC removed by Dr. [name] 02/2017, incision is well healed.  -He was due for colonoscopy in 03/2017, he has not yet made an appointment; he will call Dr. Ulyses Amor office to schedule  -CEA is mildly elevated above normal range, 5.16 today; I am not overly suspicious for recurrence. Will repeat this in 2 months -f/u in 4 months, continue colon cancer surveillance; will plan for repeat imagine in 01/2018  2. Iron deficient anemia -Secondary to his colon cancer -His anemia has resolved, repeated study showed normal ferritin, slightly elevated serum iron. -Resolved now, he can stop his oral iron pill; iron studies in normal range 04/25/17  3. HTN, OSA, CHF, obesity -He will continue follow-up with his primary care physician  4. Insomnia -Previously recommended Melatonin.   5. Soreness to right lateral upper leg -Encouraged the patient to take Tylenol and stay active to improve muscular soreness -Patient states asper cream helps -overall improved   6. Mild dry cough -CT scan from 01/20/17 shows a groundglass change in the right upper lobe lungs,  possible aspiration, patient has mild dry cough, but no evidence of URI.  -We will monitor, I again recommended he get more exercise   7. Mild hyperbilirubinemia  -His recent labs showed a slightly elevated total bilirubin, liver enzymes are within normal limits.  -CT scan showed no liver metastasis, the exam was unremarkable.  -Continue monitoring, bili and LFT's WNL today 04/25/17  8. Hematoma -large hematoma noted to right upper posterior arm, denies other bleeding -he is on coumadin for Afib; 8 mg daily at bedtime per Dr. Carney Living; he has not has labs checked in a couple months -coumadin was held for [facility] removal in 02/2017 and he reportedly missed some doses thereafter  -he will call Dr. Carney Living this week to have labs checked and medication adjusted if needed; he should have routine monitoring monthly  9. MIld  hyperkalemia -5.3 today; he takes 2 tablets 20 mEq daily  -he is on lasix, lisinopril which will affect his K -I instructed him to hold KCL for 3 days then reduce to 1 tablet daily until repeat BMP by PCP or Dr. Carney Living in 2-3 weeks   Plan -flu vaccine today -repeat CEA in 2 months -patient will call Dr. [name] to schedule colonoscopy  -patient will call Dr. Carney Living to f/u coumadin -hold oral K for 3 days then reduce to 1 tablet daily, f/u BMP with PCP or Dr. Carney Living in 2-3 weeks  -lab, f/u in 4 months    All questions were answered. The patient knows to call the clinic with any problems, questions or concerns.  Cira Rue, NP 04/25/2017   I have seen the patient, examined him. I agree with the assessment and and plan and have edited the notes.   [name]  04/25/2017

## 2017-04-26 ENCOUNTER — Telehealth: Payer: Self-pay | Admitting: Hematology

## 2017-04-26 ENCOUNTER — Telehealth: Payer: Self-pay | Admitting: *Deleted

## 2017-04-26 NOTE — Telephone Encounter (Signed)
Spoke with patient's daughter regarding appts added per 10/23 los.

## 2017-04-26 NOTE — Telephone Encounter (Signed)
Telephone call to patient's daughter- per Regan Rakers, NP Patient will hold potassium supplement for 3 days and resume on Sunday with only 1 tablet daily. [name] repeated directions back and verbalized an understanding to call this office with any questions or concerns. Patient will follow up with PCP or Dr Carney Living in 2-3 weeks for lab work.

## 2017-05-06 ENCOUNTER — Telehealth: Payer: Self-pay

## 2017-05-06 NOTE — Telephone Encounter (Signed)
Spoke with pts daughter and she is aware of the lab in November  Steven Barnett

## 2017-05-13 ENCOUNTER — Other Ambulatory Visit: Payer: Self-pay | Admitting: Internal Medicine

## 2017-06-02 ENCOUNTER — Telehealth: Payer: Self-pay | Admitting: Hematology

## 2017-06-02 ENCOUNTER — Other Ambulatory Visit: Payer: Medicare Other

## 2017-06-02 NOTE — Telephone Encounter (Signed)
06/03/2007 Called and left message @ [phone] informing patient that FMLA paperwork was ready and would be left at front desk reception area for pick up per the patient's cover sheet request.

## 2017-06-05 ENCOUNTER — Other Ambulatory Visit (HOSPITAL_BASED_OUTPATIENT_CLINIC_OR_DEPARTMENT_OTHER): Payer: Medicare Other

## 2017-06-05 DIAGNOSIS — C182 Malignant neoplasm of ascending colon: Secondary | ICD-10-CM | POA: Diagnosis present

## 2017-06-05 DIAGNOSIS — D500 Iron deficiency anemia secondary to blood loss (chronic): Secondary | ICD-10-CM

## 2017-06-05 DIAGNOSIS — C180 Malignant neoplasm of cecum: Secondary | ICD-10-CM

## 2017-06-05 LAB — CBC WITH DIFFERENTIAL/PLATELET
BASO%: 0.1 % (ref 0.0–2.0)
Basophils Absolute: 0 10*3/uL (ref 0.0–0.1)
EOS%: 1.6 % (ref 0.0–7.0)
Eosinophils Absolute: 0.2 10*3/uL (ref 0.0–0.5)
HEMATOCRIT: 48.7 % (ref 38.4–49.9)
HEMOGLOBIN: 15.8 g/dL (ref 13.0–17.1)
LYMPH#: 0.9 10*3/uL (ref 0.9–3.3)
LYMPH%: 9.8 % — ABNORMAL LOW (ref 14.0–49.0)
MCH: 27.1 pg — ABNORMAL LOW (ref 27.2–33.4)
MCHC: 32.4 g/dL (ref 32.0–36.0)
MCV: 83.5 fL (ref 79.3–98.0)
MONO#: 1 10*3/uL — AB (ref 0.1–0.9)
MONO%: 10.4 % (ref 0.0–14.0)
NEUT%: 78.1 % — AB (ref 39.0–75.0)
NEUTROS ABS: 7.2 10*3/uL — AB (ref 1.5–6.5)
PLATELETS: 167 10*3/uL (ref 140–400)
RBC: 5.83 10*6/uL — ABNORMAL HIGH (ref 4.20–5.82)
RDW: 15.5 % — AB (ref 11.0–14.6)
WBC: 9.2 10*3/uL (ref 4.0–10.3)

## 2017-06-05 LAB — COMPREHENSIVE METABOLIC PANEL
ALBUMIN: 3.7 g/dL (ref 3.5–5.0)
ALK PHOS: 175 U/L — AB (ref 40–150)
ALT: 18 U/L (ref 0–55)
ANION GAP: 7 meq/L (ref 3–11)
AST: 23 U/L (ref 5–34)
BUN: 19.3 mg/dL (ref 7.0–26.0)
CO2: 30 mEq/L — ABNORMAL HIGH (ref 22–29)
Calcium: 9.2 mg/dL (ref 8.4–10.4)
Chloride: 101 mEq/L (ref 98–109)
Creatinine: 1.2 mg/dL (ref 0.7–1.3)
GLUCOSE: 85 mg/dL (ref 70–140)
POTASSIUM: 4.9 meq/L (ref 3.5–5.1)
SODIUM: 139 meq/L (ref 136–145)
Total Bilirubin: 1.01 mg/dL (ref 0.20–1.20)
Total Protein: 7.8 g/dL (ref 6.4–8.3)

## 2017-06-05 LAB — FERRITIN: Ferritin: 191 ng/ml (ref 22–316)

## 2017-06-26 ENCOUNTER — Other Ambulatory Visit: Payer: Self-pay | Admitting: Internal Medicine

## 2017-07-26 ENCOUNTER — Telehealth: Payer: Self-pay | Admitting: *Deleted

## 2017-07-26 ENCOUNTER — Other Ambulatory Visit: Payer: Self-pay | Admitting: Gastroenterology

## 2017-07-26 DIAGNOSIS — I5020 Unspecified systolic (congestive) heart failure: Secondary | ICD-10-CM | POA: Diagnosis not present

## 2017-07-26 DIAGNOSIS — I482 Chronic atrial fibrillation: Secondary | ICD-10-CM | POA: Diagnosis not present

## 2017-07-26 DIAGNOSIS — Z85038 Personal history of other malignant neoplasm of large intestine: Secondary | ICD-10-CM | POA: Diagnosis not present

## 2017-07-26 NOTE — Telephone Encounter (Signed)
S/w pt's daughter per (DPR), made appt to see Kerin Ransom, PA, stated doesn't think pt is on coumadin per Dr. Charolotte Capuchin, pt's cancer doctor.  If pt is on coumadin will call back to schedule coumadin appt same day as appt.

## 2017-07-26 NOTE — Telephone Encounter (Signed)
   [facility] Pre-operative Risk Assessment    Request for surgical clearance:  1. What type of surgery is being performed? Colonoscopy   2. When is this surgery scheduled? 08/18/17   3. What type of clearance is required (medical clearance vs. Pharmacy clearance to hold med vs. Both)? Both  4. Are there any medications that need to be held prior to surgery and how long?Warfarin    5. Practice name and name of physician performing surgery? [facility] Dr. [name]   6. What is your office phone and fax number? [phone]   7. Anesthesia type (None, local, MAC, general) ? Propofol   Alicea Wente A Marylynn Rigdon 07/26/2017, 2:25 PM  _________________________________________________________________   (provider comments below)

## 2017-07-26 NOTE — Telephone Encounter (Signed)
   Primary Cardiologist:No primary care provider on file.  Chart reviewed as part of pre-operative protocol coverage. Because of Steven Barnett's past medical history and time since last visit, he/she will require a follow-up visit in order to better assess preoperative cardiovascular risk.  Pre-op covering staff: - Please schedule appointment and call patient to inform them. - Please contact requesting surgeon's office via preferred method (i.e, phone, fax) to inform them of need for appointment prior to surgery.  Will need anticoagulation addressed as well.   [name], NP  07/26/2017, 3:04 PM

## 2017-07-26 NOTE — Telephone Encounter (Signed)
Pt takes Coumadin for afib with CHADS2VASc score of 3 (age, HTN, CHF). Ok to hold Coumadin for 5 days prior to procedure. Would also have INR check scheduled when comes in for pre op clearance since pt is 6 months overdue for his INR check.

## 2017-07-31 ENCOUNTER — Other Ambulatory Visit: Payer: Self-pay | Admitting: Internal Medicine

## 2017-07-31 ENCOUNTER — Other Ambulatory Visit: Payer: Self-pay | Admitting: Cardiovascular Disease

## 2017-07-31 NOTE — Telephone Encounter (Signed)
Rx request sent to pharmacy.  

## 2017-08-03 ENCOUNTER — Other Ambulatory Visit: Payer: Self-pay

## 2017-08-03 ENCOUNTER — Encounter (HOSPITAL_COMMUNITY): Payer: Self-pay

## 2017-08-08 ENCOUNTER — Encounter: Payer: Self-pay | Admitting: Genetic Counselor

## 2017-08-10 ENCOUNTER — Ambulatory Visit (INDEPENDENT_AMBULATORY_CARE_PROVIDER_SITE_OTHER): Payer: Medicare Other | Admitting: Pharmacist

## 2017-08-10 ENCOUNTER — Encounter: Payer: Self-pay | Admitting: Cardiology

## 2017-08-10 ENCOUNTER — Ambulatory Visit (INDEPENDENT_AMBULATORY_CARE_PROVIDER_SITE_OTHER): Payer: Medicare Other | Admitting: Cardiology

## 2017-08-10 VITALS — BP 120/76 | HR 66 | Ht 70.0 in | Wt 262.0 lb

## 2017-08-10 DIAGNOSIS — Z79899 Other long term (current) drug therapy: Secondary | ICD-10-CM | POA: Diagnosis not present

## 2017-08-10 DIAGNOSIS — Z01810 Encounter for preprocedural cardiovascular examination: Secondary | ICD-10-CM | POA: Diagnosis not present

## 2017-08-10 DIAGNOSIS — C182 Malignant neoplasm of ascending colon: Secondary | ICD-10-CM | POA: Diagnosis not present

## 2017-08-10 DIAGNOSIS — G4733 Obstructive sleep apnea (adult) (pediatric): Secondary | ICD-10-CM

## 2017-08-10 DIAGNOSIS — Z7901 Long term (current) use of anticoagulants: Secondary | ICD-10-CM | POA: Diagnosis not present

## 2017-08-10 DIAGNOSIS — I10 Essential (primary) hypertension: Secondary | ICD-10-CM

## 2017-08-10 DIAGNOSIS — I482 Chronic atrial fibrillation, unspecified: Secondary | ICD-10-CM

## 2017-08-10 DIAGNOSIS — I4821 Permanent atrial fibrillation: Secondary | ICD-10-CM

## 2017-08-10 DIAGNOSIS — I5032 Chronic diastolic (congestive) heart failure: Secondary | ICD-10-CM | POA: Diagnosis not present

## 2017-08-10 LAB — COMPREHENSIVE METABOLIC PANEL
ALT: 17 IU/L (ref 0–44)
AST: 24 IU/L (ref 0–40)
Albumin/Globulin Ratio: 1.3 (ref 1.2–2.2)
Albumin: 4.1 g/dL (ref 3.6–4.8)
Alkaline Phosphatase: 177 IU/L — ABNORMAL HIGH (ref 39–117)
BUN/Creatinine Ratio: 14 (ref 10–24)
BUN: 12 mg/dL (ref 8–27)
Bilirubin Total: 0.4 mg/dL (ref 0.0–1.2)
CO2: 28 mmol/L (ref 20–29)
Calcium: 9.3 mg/dL (ref 8.6–10.2)
Chloride: 98 mmol/L (ref 96–106)
Creatinine, Ser: 0.83 mg/dL (ref 0.76–1.27)
GFR calc Af Amer: 107 mL/min/{1.73_m2} (ref >59)
GFR calc non Af Amer: 92 mL/min/{1.73_m2} (ref >59)
Globulin, Total: 3.2 g/dL (ref 1.5–4.5)
Glucose: 70 mg/dL (ref 65–99)
Potassium: 4.9 mmol/L (ref 3.5–5.2)
Sodium: 139 mmol/L (ref 134–144)
Total Protein: 7.3 g/dL (ref 6.0–8.5)

## 2017-08-10 LAB — CBC
Hematocrit: 47.3 % (ref 37.5–51.0)
Hemoglobin: 15.5 g/dL (ref 13.0–17.7)
MCH: 27.3 pg (ref 26.6–33.0)
MCHC: 32.8 g/dL (ref 31.5–35.7)
MCV: 83 fL (ref 79–97)
Platelets: 742 10*3/uL — ABNORMAL HIGH (ref 150–379)
RBC: 5.68 x10E6/uL (ref 4.14–5.80)
RDW: 15.5 % — ABNORMAL HIGH (ref 12.3–15.4)
WBC: 5.3 10*3/uL (ref 3.4–10.8)

## 2017-08-10 LAB — POCT INR: INR: 1.6

## 2017-08-10 NOTE — Assessment & Plan Note (Signed)
Pt apparently has had AF since his thoracotomy in May 2017. He is rate controlled and asymptomatic.

## 2017-08-10 NOTE — Assessment & Plan Note (Signed)
Controlled.  

## 2017-08-10 NOTE — Patient Instructions (Signed)
Your physician recommends that you return for lab work TODAY  Your physician recommends that you schedule a follow-up appointment in: 2-3 months with Dr. [name].

## 2017-08-10 NOTE — Assessment & Plan Note (Signed)
Pt with a history of diastolic CHF in 7342. His last echo in 2017 showed normal LVF with moderate LVH

## 2017-08-10 NOTE — Assessment & Plan Note (Signed)
S/P hemicolectomy Oct 2017, chemotherapy, and radiation. Dr Burr Medico follows

## 2017-08-10 NOTE — Assessment & Plan Note (Addendum)
Pt apparently has had AF since his thoracotomy in May 2010. He is rate controlled and asymptomatic.

## 2017-08-10 NOTE — Progress Notes (Signed)
08/10/2017 Steven Barnett   11/07/1950  [id number]  Primary Physician [name], Bennetta Laos, PA-C Primary Cardiologist: Dr [name]  HPI:  67 y/o AA male seen in the office today for pre op clearance (survalience colonoscopy with Dr [name]). The pt has a history of diastolic CHF. A Myoview done in 2016 was low risk. An echo in 2017 showed normal LVF with moderate LVH. In may 2010 he underwent a thoracotomy for empyema and had AF post op. He has remained in AF with CVR. He has been rate controlled and on Coumadin though INR follow ups have been spotty. In Sept 2017 he found to have colon cancer on routine colonoscopy. He had hemicolectomy in Oct 2017 as well as chemotherapy and radiation. His LOV with Dr [name] was Feb 2018. He is seen today for per op clearance prior to colonoscopy.  He denies chest pain or unusual dyspnea.    Current Outpatient Medications  Medication Sig Dispense Refill  . acetaminophen (TYLENOL) 500 MG tablet Take 1,000 mg by mouth 2 (two) times daily.    Marland Kitchen albuterol (PROVENTIL HFA;VENTOLIN HFA) 108 (90 Base) MCG/ACT inhaler Inhale 2 puffs into the lungs every 6 (six) hours as needed for wheezing or shortness of breath. 1 Inhaler 2  . aspirin EC 81 MG tablet Take 81 mg by mouth daily.    Marland Kitchen diltiazem (CARDIZEM) 120 MG tablet TAKE ONE TABLET BY MOUTH ONCE DAILY 30 tablet 11  . ferrous sulfate 325 (65 FE) MG tablet Take 1 tablet (325 mg total) by mouth 2 (two) times daily with a meal. 60 tablet 3  . furosemide (LASIX) 80 MG tablet TAKE 1 TABLET BY MOUTH TWICE DAILY PT  DUE  FOR  FOLLOW  UP  APPT 60 tablet 0  . hydrALAZINE (APRESOLINE) 50 MG tablet TAKE ONE TABLET BY MOUTH TWICE DAILY 180 tablet 2  . ipratropium (ATROVENT HFA) 17 MCG/ACT inhaler Inhale 2 puffs into the lungs every 6 (six) hours as needed for wheezing.     Marland Kitchen KLOR-CON M20 20 MEQ tablet TAKE 2 TABLETS BY MOUTH ONCE DAILY 180 tablet 3  . lisinopril (PRINIVIL,ZESTRIL) 20 MG tablet TAKE ONE TABLET BY MOUTH ONCE DAILY 30  tablet 10  . metoprolol tartrate (LOPRESSOR) 25 MG tablet TAKE ONE TABLET BY MOUTH TWICE DAILY 180 tablet 2  . oxyCODONE (OXY IR/ROXICODONE) 5 MG immediate release tablet Take 1-2 tablets (5-10 mg total) by mouth every 6 (six) hours as needed for moderate pain, severe pain or breakthrough pain. 15 tablet 0  . sildenafil (REVATIO) 20 MG tablet Take 2-3 tablets (40-60 mg total) by mouth daily. 50 tablet 0  . warfarin (COUMADIN) 4 MG tablet Take 1 to 2 tablets by mouth every day as directed by coumadin clinic (Patient taking differently: Take 8 mg by mouth at bedtime. ) 60 tablet 1  . warfarin (COUMADIN) 4 MG tablet Take 1 to 2 tables daily as directed by coumadin clinic. CHECK INR BEFORE NEXT REFILL AUTHORIZATION. 14 tablet 0   No current facility-administered medications for this visit.     Allergies  Allergen Reactions  . No Known Allergies     Past Medical History:  Diagnosis Date  . Anemia   . Arthralgia of both knees   . Arthritis    "right knee" (10/01/2015)  . Atrial fibrillation ([place])   . Cancer (HCC)    STAGE 1 COLON CANCER  . Chronic anticoagulation 2010   Coumadin  . Chronic diastolic CHF (congestive heart failure),  NYHA class 2 ([place])   . Dyspnea   . Dysrhythmia   . Hypertension   . OSA (obstructive sleep apnea) 2016   "couldn't take the mask during the testing" (10/01/2015)  . Permanent atrial fibrillation ([place]) 2010  . Pneumonia 3/29/2017and june 2017    Social History   Socioeconomic History  . Marital status: Married    Spouse name: Steven Barnett  . Number of children: Not on file  . Years of education: Not on file  . Highest education level: Not on file  Social Needs  . Financial resource strain: Not on file  . Food insecurity - worry: Not on file  . Food insecurity - inability: Not on file  . Transportation needs - medical: Not on file  . Transportation needs - non-medical: Not on file  Occupational History  . Not on file  Tobacco Use  . Smoking status:  Former Smoker    Packs/day: 0.10    Years: 2.00    Pack years: 0.20    Types: Cigarettes    Last attempt to quit: 12/16/1974    Years since quitting: 42.6  . Smokeless tobacco: Never Used  . Tobacco comment: "1 pack cigarettes would last me a month"  Substance and Sexual Activity  . Alcohol use: Yes    Alcohol/week: 0.0 oz    Comment: 10/01/2015 "might have1 6 pack of beer/year"  . Drug use: No    Comment: Hx polysubstance abuse, quit 2008  . Sexual activity: Not Currently  Other Topics Concern  . Not on file  Social History Narrative   Married, wife Steven Barnett (since 3)     Family History  Problem Relation Age of Onset  . Heart disease Father   . Heart failure Father   . Colon polyps Father        unspecified number - pt of intestine surgically resected  . Hypertension Mother   . Diabetes Mother   . Hyperlipidemia Mother   . Colon polyps Mother        unspecified number - pt of intestine surgically resected  . Dementia Mother        d. 67  . Stroke Maternal Grandmother   . Stroke Sister   . Colon cancer Sister 36       w/ "cancerous polyps" - unspecified number; underwent surgery and radiation  . Colon polyps Brother        unspecified number - polypectomies  . Colon cancer Sister        dx 52-60; s/p surgery and radiation  . Bone cancer Maternal Uncle        dx. 38s  . Lung cancer Paternal Grandfather        d. 80s-90s; lung cancer vs TB  . Colon cancer Sister        dx. 47-60; s/p surgery and radiation  . Cancer Maternal Uncle        mother had about 7-8 other siblings, most passed at older ages, many of whom had some type of cancer  . Heart attack Neg Hx      Review of Systems: General: negative for chills, fever, night sweats or weight changes.  Cardiovascular: negative for chest pain, dyspnea on exertion, edema, orthopnea, palpitations, paroxysmal nocturnal dyspnea or shortness of breath Dermatological: negative for rash Respiratory: negative for cough or  wheezing Urologic: negative for hematuria Abdominal: negative for nausea, vomiting, diarrhea, bright red blood per rectum, melena, or hematemesis Neurologic: negative for visual changes, syncope, or dizziness All  other systems reviewed and are otherwise negative except as noted above.    Blood pressure 120/76, pulse 66, height 5\' 10"  (1.778 m), weight 262 lb (118.8 kg).  General appearance: alert, cooperative, no distress and mildly obese Neck: no carotid bruit and no JVD Lungs: clear to auscultation bilaterally Heart: irregularly irregular rhythm Extremities: trace edema Skin: Skin color, texture, turgor normal. No rashes or lesions Neurologic: Grossly normal  EKG AF with CVR  ASSESSMENT AND PLAN:   Pre-operative cardiovascular examination Pt seen today as pre op colonoscopy clearance.  Current use of long term anticoagulation Pt is on chronic Coumadin for AF. CHADs VASc=3 for HTN, age, and history of diastolic CHF  Essential hypertension, benign Controlled  Chronic diastolic (congestive) heart failure ([place]) Pt with a history of diastolic CHF in 0233. His last echo in 2017 showed normal LVF with moderate LVH  Permanent atrial fibrillation (HCC) Pt apparently has had AF since his thoracotomy in May 2010. He is rate controlled and asymptomatic.   Obstructive sleep apnea Suspected sleep apnea. He could not finish his sleep study in the past but since then he lost 50-60 lbs after his colon cancer surgery.   Cancer of right colon [facility]) S/P hemicolectomy Oct 2017, chemotherapy, and radiation. Dr Burr Medico follows   PLAN: Chart reviewed as part of pre-operative protocol coverage. Given past medical history and time since last visit, based on ACC/AHA guidelines, SIDDHANTH DENK would be at acceptable risk for the planned procedure without further cardiovascular testing.   I will route this recommendation to the requesting party via Epic fax function and remove from pre-op  pool.  Please call with questions.  I did order labs today. Our pharmacist indicated he can hold his Coumadin 5 days pre op and resume ASAP post op. He will see Dr [name] in f/u. I wonder if he might be better treated with a NOAC vs Coumadin since f/u has not been regular.   Kerin Ransom, PA-C 08/10/2017, 10:16 AM

## 2017-08-10 NOTE — Assessment & Plan Note (Signed)
Suspected sleep apnea. He could not finish his sleep study in the past but since then he lost 50-60 lbs after his colon cancer surgery.

## 2017-08-10 NOTE — Assessment & Plan Note (Signed)
Pt seen today as pre op colonoscopy clearance.

## 2017-08-10 NOTE — Assessment & Plan Note (Signed)
Pt is on chronic Coumadin for AF. CHADs VASc=3 for HTN, age, and history of diastolic CHF

## 2017-08-10 NOTE — Patient Instructions (Addendum)
Take 3 tables today (08/10/17) ONLY, then continue taking 2 tables every day until 08/12/2017. STOP taking warfarin on 08/13/2016 for colonoscopy on 08/18/2017 After colonoscopy, resume taking warfarin 2 tablets daily and repeat INR in 2 weeks

## 2017-08-18 ENCOUNTER — Ambulatory Visit (HOSPITAL_COMMUNITY): Admission: RE | Admit: 2017-08-18 | Payer: Medicare Other | Source: Ambulatory Visit | Admitting: Gastroenterology

## 2017-08-18 HISTORY — DX: Dyspnea, unspecified: R06.00

## 2017-08-18 SURGERY — COLONOSCOPY WITH PROPOFOL
Anesthesia: Monitor Anesthesia Care

## 2017-08-25 ENCOUNTER — Other Ambulatory Visit: Payer: Self-pay | Admitting: Cardiovascular Disease

## 2017-08-28 ENCOUNTER — Telehealth: Payer: Self-pay | Admitting: Hematology

## 2017-08-28 ENCOUNTER — Inpatient Hospital Stay: Payer: Medicare Other | Attending: Hematology | Admitting: Adult Health

## 2017-08-28 ENCOUNTER — Encounter: Payer: Self-pay | Admitting: Adult Health

## 2017-08-28 ENCOUNTER — Inpatient Hospital Stay: Payer: Medicare Other

## 2017-08-28 VITALS — BP 121/73 | HR 54 | Temp 97.6°F | Resp 18 | Ht 70.0 in | Wt 263.5 lb

## 2017-08-28 DIAGNOSIS — C180 Malignant neoplasm of cecum: Secondary | ICD-10-CM

## 2017-08-28 DIAGNOSIS — C182 Malignant neoplasm of ascending colon: Secondary | ICD-10-CM

## 2017-08-28 DIAGNOSIS — Z862 Personal history of diseases of the blood and blood-forming organs and certain disorders involving the immune mechanism: Secondary | ICD-10-CM | POA: Insufficient documentation

## 2017-08-28 LAB — CBC WITH DIFFERENTIAL/PLATELET
BASOS ABS: 0.1 10*3/uL (ref 0.0–0.1)
Basophils Relative: 1 %
Eosinophils Absolute: 0.2 10*3/uL (ref 0.0–0.5)
Eosinophils Relative: 2 %
HEMATOCRIT: 47.9 % (ref 38.4–49.9)
Hemoglobin: 15.3 g/dL (ref 13.0–17.1)
LYMPHS PCT: 10 %
Lymphs Abs: 0.9 10*3/uL (ref 0.9–3.3)
MCH: 26.9 pg — ABNORMAL LOW (ref 27.2–33.4)
MCHC: 31.9 g/dL — ABNORMAL LOW (ref 32.0–36.0)
MCV: 84.3 fL (ref 79.3–98.0)
Monocytes Absolute: 0.7 10*3/uL (ref 0.1–0.9)
Monocytes Relative: 8 %
NEUTROS ABS: 7 10*3/uL — AB (ref 1.5–6.5)
Neutrophils Relative %: 79 %
PLATELETS: 217 10*3/uL (ref 140–400)
RBC: 5.68 MIL/uL (ref 4.20–5.82)
RDW: 15.4 % — ABNORMAL HIGH (ref 11.0–14.6)
WBC: 8.9 10*3/uL (ref 4.0–10.3)

## 2017-08-28 LAB — COMPREHENSIVE METABOLIC PANEL
ALBUMIN: 3.5 g/dL (ref 3.5–5.0)
ALT: 18 U/L (ref 0–55)
ANION GAP: 7 (ref 3–11)
AST: 15 U/L (ref 5–34)
Alkaline Phosphatase: 152 U/L — ABNORMAL HIGH (ref 40–150)
BILIRUBIN TOTAL: 0.8 mg/dL (ref 0.2–1.2)
BUN: 14 mg/dL (ref 7–26)
CO2: 28 mmol/L (ref 22–29)
Calcium: 8.9 mg/dL (ref 8.4–10.4)
Chloride: 102 mmol/L (ref 98–109)
Creatinine, Ser: 0.96 mg/dL (ref 0.70–1.30)
GFR calc non Af Amer: >60 mL/min (ref >60)
Glucose, Bld: 81 mg/dL (ref 70–140)
POTASSIUM: 4.1 mmol/L (ref 3.5–5.1)
SODIUM: 137 mmol/L (ref 136–145)
TOTAL PROTEIN: 7.3 g/dL (ref 6.4–8.3)

## 2017-08-28 LAB — IRON AND TIBC
Iron: 90 ug/dL (ref 42–163)
SATURATION RATIOS: 31 % — AB (ref 42–163)
TIBC: 289 ug/dL (ref 202–409)
UIBC: 199 ug/dL

## 2017-08-28 LAB — CEA (IN HOUSE-CHCC): CEA (CHCC-In House): 2.06 ng/mL (ref 0.00–5.00)

## 2017-08-28 LAB — FERRITIN: Ferritin: 108 ng/mL (ref 22–316)

## 2017-08-28 MED ORDER — HEPARIN SOD (PORK) LOCK FLUSH 100 UNIT/ML IV SOLN
500.0000 [IU] | Freq: Once | INTRAVENOUS | Status: DC | PRN
Start: 2017-08-28 — End: 2022-06-21
  Filled 2017-08-28: qty 5

## 2017-08-28 MED ORDER — SODIUM CHLORIDE 0.9% FLUSH
10.0000 mL | INTRAVENOUS | Status: DC | PRN
  Filled 2017-08-28: qty 10

## 2017-08-28 NOTE — Progress Notes (Signed)
[facility] Follow up:    Steven Barnett, [place], [address] [place] [address]   DIAGNOSIS: Cancer Staging Cancer of right colon [facility]) Staging form: Colon and Rectum, AJCC 7th Edition - Clinical stage from 04/12/2016: Stage IIIA (T2, N1, M0) - Signed by [name], MD on 05/01/2016   SUMMARY OF ONCOLOGIC HISTORY: Oncology History   Cancer of right colon [facility])   Staging form: Colon and Rectum, AJCC 7th Edition   - Clinical stage from 04/12/2016: Stage IIIA (T2, N1, M0) - Signed by [name], MD on 05/01/2016      Cancer of right colon ([place])   03/04/2016 Procedure    COLONOSCOPY: A polypoid and ulcerated non-obstructing large mass was found in the cecum. The mass was noncircumferential. The mass measured three cm in length. In addition, its diameter measured three mm. (Dr. [name])       03/15/2016 Imaging    1. Polypoid cecal mass. No findings of liver metastatic disease or other definite metastatic disease. There are some small adjacent pericecal lymph nodes which are not pathologically enlarged. 2. Adenopathy in the chest is present but was also present in 2010, albeit slightly less prominent. This may be reactive adenopathy related to the chronic exudative right pleural effusion.       04/07/2016 Tumor Marker    Patient's tumor was tested for the following markers: CEA. Results of the tumor marker test revealed 2.7.      04/12/2016 Initial Diagnosis    Cecal cancer ([place])      04/12/2016 Definitive Surgery    Laparoscopic right hemicolectomy (Dr. [name])      04/12/2016 Pathologic Stage    pT2 pN1 pMX--Grade 3 adenocarcinoma with 2/16 nodes positive, clear margins, negative for perineural or lymphvascular invasion; invades muscularis propria Loss of expression MLH1/PMS2      06/02/2016 - 09/13/2016 Adjuvant Chemotherapy     Ajuvant chemotherapy FOLFOX, every 2 weeks for 6 cycles (3 months), last cycle postponed due to  hospitalization       06/29/2016 Genetic Testing    POLE c.[id number] VUS identified on the Colorectal cancer panel.  Negative genetic testing for the MSH2 inversion analysis ([place] inversion). The Colorectal Cancer Panel offered by [facility] includes sequencing and/or duplication/deletion testing of the following 19 genes: APC, ATM, AXIN2, BMPR1A, CDH1, CHEK2, EPCAM, MLH1, MSH2, MSH6, MUTYH, PMS2, POLD1, POLE, PTEN, SCG5/GREM1, SMAD4, STK11, and TP53. The report date is 06/22/2016 for the [facility] panel and 06/29/2016 for the [place] inversion.  POLE c.4523G>A VUS has been reclassified to a likely benign variant based on a combination of sources, e.g, internal data, published literature, population databases and in silico models. The reclassification date is June 09, 2017.       08/17/2016 - 08/20/2016 Hospital Admission    Admit date: 08/17/2016 Admission diagnosis: Acute respiratory failure with hypoxia  Additional comments: Patient was admitted initially with hypoxia and found to have H influenza positive-started on Tamiflu and broad-spectrum antibiotics were D escalate. He was hypotensive on admission given IV saline 75 cc per hour saline lock 2/16 and antihypertensives which were held including lisinopril and Lasix and hydralazine on discharge. Joint hospitalization he continued on his metoprolol twice a day      01/20/2017 Imaging    CT CAP W Contrast 01/20/17 IMPRESSION: 1. No evidence of local tumor recurrence at the ileocolic anastomosis . 2. No findings suspicious for metastatic disease in the chest, abdomen or pelvis. 3. Mild mediastinal lymphadenopathy  is stable since 2010 and considered benign . 4. Stable morphologic changes suggestive of cirrhosis. 5. Stable patulous fluid-filled thoracic esophagus. Patchy ground-glass opacity in the posterior right upper lobe is probably inflammatory, possibly due to aspiration. 6. Stable chronic small loculated dependent right pleural effusion.         CURRENT THERAPY: Observation  INTERVAL HISTORY: Steven Barnett 67 y.o. male returns for evaluation of his history of colon cancer.  He is doing well today.  He has no issues.  He denies fatigue, fever, chills, weight loss, bowel/bladder changes, pain, or any further concerns.  He has f/u with GI for colonoscopy in 09/2017.     Patient Active Problem List   Diagnosis Date Noted  . Hypotension 08/17/2016  . SIRS (systemic inflammatory response syndrome) ([place]) 08/17/2016  . Influenza A 08/17/2016  . Septic shock ([place]) 08/10/2016  . Neutropenia, drug-induced ([place])   . Gastroenteritis due to norovirus   . AKI (acute kidney injury) ([place])   . Genetic testing 07/01/2016  . Port catheter in place 06/29/2016  . Family history of colon cancer 06/08/2016  . Family history of colonic polyps 06/08/2016  . Cancer of right colon ([place]) 04/12/2016  . Pre-operative cardiovascular examination 04/06/2016  . CAP (community acquired pneumonia) 02/02/2016  . Acute on chronic diastolic heart failure ([place]) 02/02/2016  . Acute respiratory failure with hypoxia ([place]) 02/02/2016  . Anemia, iron deficiency 02/02/2016  . Current use of long term anticoagulation 12/03/2015  . Pneumonia 09/30/2015  . PNA (pneumonia) 09/30/2015  . Acute on chronic congestive heart failure ([place])   . CHF exacerbation ([place]) 07/08/2015  . Community acquired pneumonia 02/10/2015  . ED (erectile dysfunction) 08/12/2014  . Arthralgia of both knees 04/08/2014  . Chronic diastolic (congestive) heart failure ([place]) 01/14/2009  . Morbid obesity ([place]) 12/31/2008  . Obstructive sleep apnea 12/31/2008  . Essential hypertension, benign 12/31/2008  . Permanent atrial fibrillation ([place]) 12/31/2008    has no active allergies.  MEDICAL HISTORY: Past Medical History:  Diagnosis Date  . Anemia   . Arthralgia of both knees   . Arthritis    "right knee" (10/01/2015)  . Atrial fibrillation ([place])   . Cancer (HCC)    STAGE 1 COLON  CANCER  . Chronic anticoagulation 2010   Coumadin  . Chronic diastolic CHF (congestive heart failure), NYHA class 2 ([place])   . Dyspnea   . Dysrhythmia   . Hypertension   . OSA (obstructive sleep apnea) 2016   "couldn't take the mask during the testing" (10/01/2015)  . Permanent atrial fibrillation ([place]) 2010  . Pneumonia 3/29/2017and june 2017    SURGICAL HISTORY: Past Surgical History:  Procedure Laterality Date  . COLONOSCOPY WITH PROPOFOL N/A 03/04/2016   Procedure: COLONOSCOPY WITH PROPOFOL;  Surgeon: Carol Ada, MD;  Location: [facility];  Service: Endoscopy;  Laterality: N/A;  . LAPAROSCOPIC PARTIAL COLECTOMY N/A 04/12/2016   Procedure: LAPAROSCOPIC ILEOCOLECTOMY;  Surgeon: [name], MD;  Location: [facility];  Service: General;  Laterality: N/A;  . PORT-A-CATH REMOVAL N/A 02/16/2017   Procedure: REMOVAL PORT-A-CATH;  Surgeon: [name], MD;  Location: [facility];  Service: General;  Laterality: N/A;  . PORTACATH PLACEMENT N/A 05/18/2016   Procedure: INSERTION PORT-A-CATH;  Surgeon: [name], MD;  Location: [facility];  Service: General;  Laterality: N/A;  . THORACOTOMY Right 2010    SOCIAL HISTORY: Social History   Socioeconomic History  . Marital status: Married    Spouse name: Ronnette Juniper  . Number of children:  Not on file  . Years of education: Not on file  . Highest education level: Not on file  Social Needs  . Financial resource strain: Not on file  . Food insecurity - worry: Not on file  . Food insecurity - inability: Not on file  . Transportation needs - medical: Not on file  . Transportation needs - non-medical: Not on file  Occupational History  . Not on file  Tobacco Use  . Smoking status: Former Smoker    Packs/day: 0.10    Years: 2.00    Pack years: 0.20    Types: Cigarettes    Last attempt to quit: 12/16/1974    Years since quitting: 42.7  . Smokeless tobacco: Never Used  . Tobacco comment: "1 pack cigarettes would last me a month"   Substance and Sexual Activity  . Alcohol use: Yes    Alcohol/week: 0.0 oz    Comment: 10/01/2015 "might have1 6 pack of beer/year"  . Drug use: No    Comment: Hx polysubstance abuse, quit 2008  . Sexual activity: Not Currently  Other Topics Concern  . Not on file  Social History Narrative   Married, wife Ronnette Juniper (since 1979)    FAMILY HISTORY: Family History  Problem Relation Age of Onset  . Heart disease Father   . Heart failure Father   . Colon polyps Father        unspecified number - pt of intestine surgically resected  . Hypertension Mother   . Diabetes Mother   . Hyperlipidemia Mother   . Colon polyps Mother        unspecified number - pt of intestine surgically resected  . Dementia Mother        d. 73  . Stroke Maternal Grandmother   . Stroke Sister   . Colon cancer Sister 71       w/ "cancerous polyps" - unspecified number; underwent surgery and radiation  . Colon polyps Brother        unspecified number - polypectomies  . Colon cancer Sister        dx 25-60; s/p surgery and radiation  . Bone cancer Maternal Uncle        dx. 37s  . Lung cancer Paternal Grandfather        d. 80s-90s; lung cancer vs TB  . Colon cancer Sister        dx. 22-60; s/p surgery and radiation  . Cancer Maternal Uncle        mother had about 7-8 other siblings, most passed at older ages, many of whom had some type of cancer  . Heart attack Neg Hx     Review of Systems  Constitutional: Negative for appetite change, chills, fatigue, fever and unexpected weight change.  HENT:   Negative for hearing loss and lump/mass.   Eyes: Negative for eye problems and icterus.  Respiratory: Negative for chest tightness, cough and shortness of breath.   Cardiovascular: Negative for chest pain, leg swelling and palpitations (h/o a fib).  Gastrointestinal: Negative for abdominal distention, abdominal pain, constipation, diarrhea, nausea and vomiting.  Endocrine: Negative for hot flashes.   Genitourinary: Negative for difficulty urinating.   Musculoskeletal: Negative for arthralgias.  Skin: Negative for itching and rash.  Neurological: Negative for dizziness, extremity weakness, headaches and numbness.  Psychiatric/Behavioral: Negative for depression. The patient is not nervous/anxious.       PHYSICAL EXAMINATION  ECOG PERFORMANCE STATUS: 0 - Asymptomatic  Vitals:   08/28/17 1358 08/28/17 1558  BP: 121/73   Pulse: (!) 46 (!) 54  Resp: 18   Temp: 97.6 F (36.4 C)   SpO2: 100%   pulse recheck 54, patient in a fib Physical Exam  Constitutional: He is oriented to person, place, and time and well-developed, well-nourished, and in no distress.  HENT:  Head: Normocephalic and atraumatic.  Mouth/Throat: Oropharynx is clear and moist. No oropharyngeal exudate.  Eyes: Pupils are equal, round, and reactive to light. No scleral icterus.  Neck: Neck supple.  Cardiovascular: Normal heart sounds.  No murmur heard. Irregularly irregular (h/o a fib)  Pulmonary/Chest: Effort normal and breath sounds normal. No respiratory distress. He has no wheezes. He has no rales. He exhibits no tenderness.  Abdominal: Soft. Bowel sounds are normal. He exhibits no distension. There is no tenderness. There is no rebound and no guarding.  Musculoskeletal: He exhibits no edema.  Lymphadenopathy:    He has no cervical adenopathy.  Neurological: He is alert and oriented to person, place, and time.  Skin: Skin is warm and dry. No rash noted.  Psychiatric: Mood and affect normal.    LABORATORY DATA:  CBC    Component Value Date/Time   WBC 8.9 08/28/2017 1239   RBC 5.68 08/28/2017 1239   HGB 15.3 08/28/2017 1239   HGB 15.5 08/10/2017 1004   HGB 15.8 06/05/2017 1343   HCT 47.9 08/28/2017 1239   HCT 47.3 08/10/2017 1004   HCT 48.7 06/05/2017 1343   PLT 217 08/28/2017 1239   PLT 742 (H) 08/10/2017 1004   MCV 84.3 08/28/2017 1239   MCV 83 08/10/2017 1004   MCV 83.5 06/05/2017 1343    MCH 26.9 (L) 08/28/2017 1239   MCHC 31.9 (L) 08/28/2017 1239   RDW 15.4 (H) 08/28/2017 1239   RDW 15.5 (H) 08/10/2017 1004   RDW 15.5 (H) 06/05/2017 1343   LYMPHSABS 0.9 08/28/2017 1239   LYMPHSABS 0.9 06/05/2017 1343   MONOABS 0.7 08/28/2017 1239   MONOABS 1.0 (H) 06/05/2017 1343   EOSABS 0.2 08/28/2017 1239   EOSABS 0.2 06/05/2017 1343   BASOSABS 0.1 08/28/2017 1239   BASOSABS 0.0 06/05/2017 1343    CMP     Component Value Date/Time   NA 137 08/28/2017 1239   NA 139 08/10/2017 1004   NA 139 06/05/2017 1343   K 4.1 08/28/2017 1239   K 4.9 06/05/2017 1343   CL 102 08/28/2017 1239   CO2 28 08/28/2017 1239   CO2 30 (H) 06/05/2017 1343   GLUCOSE 81 08/28/2017 1239   GLUCOSE 85 06/05/2017 1343   BUN 14 08/28/2017 1239   BUN 12 08/10/2017 1004   BUN 19.3 06/05/2017 1343   CREATININE 0.96 08/28/2017 1239   CREATININE 1.2 06/05/2017 1343   CALCIUM 8.9 08/28/2017 1239   CALCIUM 9.2 06/05/2017 1343   PROT 7.3 08/28/2017 1239   PROT 7.3 08/10/2017 1004   PROT 7.8 06/05/2017 1343   ALBUMIN 3.5 08/28/2017 1239   ALBUMIN 4.1 08/10/2017 1004   ALBUMIN 3.7 06/05/2017 1343   AST 15 08/28/2017 1239   AST 23 06/05/2017 1343   ALT 18 08/28/2017 1239   ALT 18 06/05/2017 1343   ALKPHOS 152 (H) 08/28/2017 1239   ALKPHOS 175 (H) 06/05/2017 1343   BILITOT 0.8 08/28/2017 1239   BILITOT 0.4 08/10/2017 1004   BILITOT 1.01 06/05/2017 1343   GFRNONAA >60 08/28/2017 1239   GFRAA >60 08/28/2017 1239     ASSESSMENT and PLAN:   Cancer of right colon ([place]) Steven Barnett is a  67 year old male with stage IIIA, grade 3 colon adenocarcinoma treated with laparoscopic right hemicolectomy and adjuvant chemotherapy with FOLFOX x 6 cycles completing treatment in 09/2016.  1. Colon Cancer: He has no sign of recurrence.  He will proceed with colonoscopy next month as scheduled.  He is due for CT chest/abdomen/pelvis in 01/2018.  He will undergo these and see Dr. Burr Medico about a week after for labs and f/u.  His  CEA is normal today.  I reviewed this with him and he is in agreement with this plan.  2. H/o iron deficiency: His iron levels and hemoglobin are normal today.  I reviewed his labs with him in detail and they are normal.    3. Health Maintenance: I recommended healthy diet/exercise.  I recommended he continue to f/u with his PCP regularly.      All questions were answered. The patient knows to call the clinic with any problems, questions or concerns. We can certainly see the patient much sooner if necessary.  A total of (30) minutes of face-to-face time was spent with this patient with greater than 50% of that time in counseling and care-coordination.  This note was electronically signed. Scot Dock, NP 08/28/2017

## 2017-08-28 NOTE — Assessment & Plan Note (Signed)
Steven Barnett is a 67 year old male with stage IIIA, grade 3 colon adenocarcinoma treated with laparoscopic right hemicolectomy and adjuvant chemotherapy with FOLFOX x 6 cycles completing treatment in 09/2016.  1. Colon Cancer: He has no sign of recurrence.  He will proceed with colonoscopy next month as scheduled.  He is due for CT chest/abdomen/pelvis in 01/2018.  He will undergo these and see Dr. Burr Medico about a week after for labs and f/u.  His CEA is normal today.  I reviewed this with him and he is in agreement with this plan.  2. H/o iron deficiency: His iron levels and hemoglobin are normal today.  I reviewed his labs with him in detail and they are normal.    3. Health Maintenance: I recommended healthy diet/exercise.  I recommended he continue to f/u with his PCP regularly.

## 2017-08-28 NOTE — Telephone Encounter (Signed)
Gave avs and calendar for july °

## 2017-08-29 ENCOUNTER — Telehealth: Payer: Self-pay | Admitting: *Deleted

## 2017-08-29 NOTE — Telephone Encounter (Signed)
Called pt's mobile # & daughter answered & was given result of labs per Dr Ernestina Penna message & informed to have him call back with any questions or concerns.

## 2017-08-29 NOTE — Telephone Encounter (Signed)
-----   Message from [name], MD sent at 08/29/2017  1:28 PM EST ----- Please let pt know his tumor marker CEA and iron study were all normal from yesterday, thanks  [name]  08/29/2017

## 2017-09-01 ENCOUNTER — Ambulatory Visit (INDEPENDENT_AMBULATORY_CARE_PROVIDER_SITE_OTHER): Payer: Medicare Other | Admitting: Pharmacist

## 2017-09-01 DIAGNOSIS — I4821 Permanent atrial fibrillation: Secondary | ICD-10-CM

## 2017-09-01 DIAGNOSIS — I482 Chronic atrial fibrillation: Secondary | ICD-10-CM | POA: Diagnosis not present

## 2017-09-01 DIAGNOSIS — Z7901 Long term (current) use of anticoagulants: Secondary | ICD-10-CM

## 2017-09-01 LAB — POCT INR: INR: 1.4

## 2017-09-08 ENCOUNTER — Other Ambulatory Visit: Payer: Self-pay

## 2017-09-08 ENCOUNTER — Encounter (HOSPITAL_COMMUNITY): Payer: Self-pay

## 2017-09-12 ENCOUNTER — Ambulatory Visit (INDEPENDENT_AMBULATORY_CARE_PROVIDER_SITE_OTHER): Payer: Medicare Other | Admitting: Pharmacist

## 2017-09-12 DIAGNOSIS — I4821 Permanent atrial fibrillation: Secondary | ICD-10-CM

## 2017-09-12 DIAGNOSIS — I482 Chronic atrial fibrillation: Secondary | ICD-10-CM

## 2017-09-12 LAB — POCT INR: INR: 1.2

## 2017-09-12 NOTE — Patient Instructions (Signed)
HOLD warfarin dose until colonoscopy day, then increase dose to 3 tablets every Monday and  Friday, 2 tablets all other days of the week. INR in 2 week

## 2017-09-14 NOTE — Anesthesia Preprocedure Evaluation (Addendum)
Anesthesia Evaluation  Patient identified by MRN, date of birth, ID band Patient awake    Reviewed: Allergy & Precautions, NPO status , Patient's Chart, lab work & pertinent test results  Airway Mallampati: III  TM Distance: >3 FB Neck ROM: Full    Dental no notable dental hx.    Pulmonary sleep apnea , former smoker,    Pulmonary exam normal        Cardiovascular hypertension, Normal cardiovascular exam+ dysrhythmias Atrial Fibrillation  Rhythm:Regular Rate:Normal  Echo 11/04/62 - Systolic function was   normal. The estimated ejection fraction was in the range of 55%   to 60%. Wall motion was normal; there were no regional wall   motion abnormalities.    Neuro/Psych    GI/Hepatic negative GI ROS,   Endo/Other    Renal/GU      Musculoskeletal   Abdominal (+) + obese,   Peds  Hematology   Anesthesia Other Findings   Reproductive/Obstetrics                            Lab Results  Component Value Date   WBC 8.9 08/28/2017   HGB 15.3 08/28/2017   HCT 47.9 08/28/2017   MCV 84.3 08/28/2017   PLT 217 08/28/2017    Anesthesia Physical Anesthesia Plan  ASA: III  Anesthesia Plan: MAC   Post-op Pain Management:    Induction:   PONV Risk Score and Plan:   Airway Management Planned: Mask and Natural Airway  Additional Equipment:   Intra-op Plan:   Post-operative Plan:   Informed Consent: I have reviewed the patients History and Physical, chart, labs and discussed the procedure including the risks, benefits and alternatives for the proposed anesthesia with the patient or authorized representative who has indicated his/her understanding and acceptance.     Plan Discussed with: CRNA  Anesthesia Plan Comments:         Anesthesia Quick Evaluation

## 2017-09-15 ENCOUNTER — Encounter (HOSPITAL_COMMUNITY)
Admission: RE | Disposition: A | Discharge status: Home / Self Care | Payer: Self-pay | Source: Ambulatory Visit | Attending: Gastroenterology

## 2017-09-15 ENCOUNTER — Ambulatory Visit (HOSPITAL_COMMUNITY)
Admission: RE | Admit: 2017-09-15 | Discharge: 2017-09-15 | Disposition: A | Discharge status: Home / Self Care | Payer: Medicare Other | Source: Ambulatory Visit | Attending: Gastroenterology | Admitting: Gastroenterology

## 2017-09-15 ENCOUNTER — Ambulatory Visit (HOSPITAL_COMMUNITY): Payer: Medicare Other | Admitting: Anesthesiology

## 2017-09-15 ENCOUNTER — Other Ambulatory Visit: Payer: Self-pay

## 2017-09-15 ENCOUNTER — Other Ambulatory Visit: Payer: Self-pay | Admitting: Internal Medicine

## 2017-09-15 ENCOUNTER — Encounter (HOSPITAL_COMMUNITY): Payer: Self-pay

## 2017-09-15 DIAGNOSIS — I482 Chronic atrial fibrillation: Secondary | ICD-10-CM | POA: Insufficient documentation

## 2017-09-15 DIAGNOSIS — Z7901 Long term (current) use of anticoagulants: Secondary | ICD-10-CM | POA: Diagnosis not present

## 2017-09-15 DIAGNOSIS — Z85038 Personal history of other malignant neoplasm of large intestine: Secondary | ICD-10-CM | POA: Diagnosis not present

## 2017-09-15 DIAGNOSIS — Z1211 Encounter for screening for malignant neoplasm of colon: Secondary | ICD-10-CM | POA: Diagnosis not present

## 2017-09-15 DIAGNOSIS — Z9049 Acquired absence of other specified parts of digestive tract: Secondary | ICD-10-CM | POA: Insufficient documentation

## 2017-09-15 DIAGNOSIS — Z87891 Personal history of nicotine dependence: Secondary | ICD-10-CM | POA: Diagnosis not present

## 2017-09-15 DIAGNOSIS — Z980 Intestinal bypass and anastomosis status: Secondary | ICD-10-CM | POA: Insufficient documentation

## 2017-09-15 DIAGNOSIS — I110 Hypertensive heart disease with heart failure: Secondary | ICD-10-CM | POA: Diagnosis not present

## 2017-09-15 DIAGNOSIS — D649 Anemia, unspecified: Secondary | ICD-10-CM | POA: Insufficient documentation

## 2017-09-15 DIAGNOSIS — G4733 Obstructive sleep apnea (adult) (pediatric): Secondary | ICD-10-CM | POA: Insufficient documentation

## 2017-09-15 DIAGNOSIS — I5032 Chronic diastolic (congestive) heart failure: Secondary | ICD-10-CM | POA: Diagnosis not present

## 2017-09-15 DIAGNOSIS — I5033 Acute on chronic diastolic (congestive) heart failure: Secondary | ICD-10-CM | POA: Diagnosis not present

## 2017-09-15 DIAGNOSIS — D125 Benign neoplasm of sigmoid colon: Secondary | ICD-10-CM | POA: Insufficient documentation

## 2017-09-15 DIAGNOSIS — K635 Polyp of colon: Secondary | ICD-10-CM | POA: Diagnosis not present

## 2017-09-15 HISTORY — PX: COLONOSCOPY WITH PROPOFOL: SHX5780

## 2017-09-15 SURGERY — COLONOSCOPY WITH PROPOFOL
Anesthesia: Monitor Anesthesia Care

## 2017-09-15 MED ORDER — LACTATED RINGERS IV SOLN
INTRAVENOUS | Status: DC | PRN
  Administered 2017-09-15: 09:00:00 via INTRAVENOUS

## 2017-09-15 MED ORDER — PROPOFOL 10 MG/ML IV BOLUS
INTRAVENOUS | Status: AC
  Filled 2017-09-15: qty 60

## 2017-09-15 MED ORDER — PROPOFOL 500 MG/50ML IV EMUL
INTRAVENOUS | Status: DC | PRN
  Administered 2017-09-15: 50 mg via INTRAVENOUS

## 2017-09-15 MED ORDER — PROPOFOL 500 MG/50ML IV EMUL
INTRAVENOUS | Status: DC | PRN
  Administered 2017-09-15: 100 ug/kg/min via INTRAVENOUS

## 2017-09-15 MED ORDER — LIDOCAINE HCL (CARDIAC) 20 MG/ML IV SOLN
INTRAVENOUS | Status: DC | PRN
  Administered 2017-09-15: 100 mg via INTRAVENOUS

## 2017-09-15 MED ORDER — EPHEDRINE SULFATE 50 MG/ML IJ SOLN
INTRAMUSCULAR | Status: DC | PRN
  Administered 2017-09-15: 5 mg via INTRAVENOUS

## 2017-09-15 SURGICAL SUPPLY — 21 items

## 2017-09-15 NOTE — Discharge Instructions (Signed)

## 2017-09-15 NOTE — Transfer of Care (Signed)
Immediate Anesthesia Transfer of Care Note  Patient: Steven Barnett  Procedure(s) Performed: COLONOSCOPY WITH PROPOFOL (N/A )  Patient Location: PACU  Anesthesia Type:MAC  Level of Consciousness: awake, alert  and oriented  Airway & Oxygen Therapy: Patient Spontanous Breathing and Patient connected to face mask oxygen  Post-op Assessment: Report given to RN  Post vital signs: Reviewed and stable  Last Vitals:  Vitals:   09/15/17 0905  BP: (!) 165/86  Pulse: 78  Resp: 20  Temp: 36.4 C  SpO2: 93%    Last Pain:  Vitals:   09/15/17 0905  TempSrc: Oral         Complications: No apparent anesthesia complications

## 2017-09-15 NOTE — Anesthesia Postprocedure Evaluation (Signed)
Anesthesia Post Note  Patient: Steven Barnett  Procedure(s) Performed: COLONOSCOPY WITH PROPOFOL (N/A )     Patient location during evaluation: PACU Anesthesia Type: MAC Level of consciousness: awake and alert Pain management: pain level controlled Vital Signs Assessment: post-procedure vital signs reviewed and stable Respiratory status: spontaneous breathing, nonlabored ventilation, respiratory function stable and patient connected to nasal cannula oxygen Cardiovascular status: stable and blood pressure returned to baseline Postop Assessment: no apparent nausea or vomiting Anesthetic complications: no    Last Vitals:  Vitals:   09/15/17 0905  BP: (!) 165/86  Pulse: 78  Resp: 20  Temp: 36.4 C  SpO2: 93%    Last Pain:  Vitals:   09/15/17 0905  TempSrc: Oral                 Barnet [place]

## 2017-09-15 NOTE — Telephone Encounter (Signed)
REFILL 

## 2017-09-15 NOTE — H&P (Signed)
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 The anemia was identified when he was hospitalized for a pneumonia in August 2017. Subsequently he underwent a right hemicolectomy with Dr. [name] and diagnosed with a Stage IIIA (T2, N1, M0) colon cancer on 04/12/2016. He was treated with adjuvant therapy from 06/02/2016-09/14/2015. He denies any lower GI symptoms at this time and there are no reports of chest pain, SOB, or MI.  Past Medical History:  Diagnosis Date  . Anemia   . Arthralgia of both knees   . Arthritis    "right knee" (10/01/2015)  . Atrial fibrillation ([place])   . Cancer (HCC)    STAGE 1 COLON CANCER  . Chronic anticoagulation 2010   Coumadin  . Chronic diastolic CHF (congestive heart failure), NYHA class 2 ([place])   . Dyspnea   . Dysrhythmia   . Hypertension   . OSA (obstructive sleep apnea) 2016   "couldn't take the mask during the testing" (10/01/2015)  . Permanent atrial fibrillation ([place]) 2010  . Pneumonia 3/29/2017and june 2017    Past Surgical History:  Procedure Laterality Date  . COLONOSCOPY WITH PROPOFOL N/A 03/04/2016   Procedure: COLONOSCOPY WITH PROPOFOL;  Surgeon: Carol Ada, MD;  Location: [facility];  Service: Endoscopy;  Laterality: N/A;  . LAPAROSCOPIC PARTIAL COLECTOMY N/A 04/12/2016   Procedure: LAPAROSCOPIC ILEOCOLECTOMY;  Surgeon: [name], MD;  Location: [place];  Service: General;  Laterality: N/A;  . PORT-A-CATH REMOVAL N/A 02/16/2017   Procedure: REMOVAL PORT-A-CATH;  Surgeon: [name], MD;  Location: [place];  Service: General;  Laterality: N/A;  . PORTACATH PLACEMENT N/A 05/18/2016   Procedure: INSERTION PORT-A-CATH;  Surgeon: [name], MD;  Location: [place];  Service: General;  Laterality: N/A;  . THORACOTOMY Right 2010    Family History  Problem Relation Age of Onset  . Heart disease Father   . Heart failure Father   . Colon polyps Father        unspecified number - pt of intestine surgically resected  . Hypertension Mother   . Diabetes Mother   . Hyperlipidemia Mother   . Colon polyps Mother        unspecified number - pt of intestine surgically resected  . Dementia Mother        d. 34  . Stroke Maternal Grandmother   . Stroke Sister   . Colon cancer Sister 42       w/ "cancerous polyps" - unspecified number; underwent surgery and radiation  . Colon polyps Brother        unspecified number - polypectomies  . Colon cancer Sister        dx 19-60; s/p surgery and radiation  . Bone cancer Maternal Uncle        dx. 16s  . Lung cancer Paternal Grandfather        d. 80s-90s; lung cancer vs TB  . Colon cancer Sister        dx. 36-60; s/p surgery and radiation  . Cancer Maternal Uncle        mother had about 7-8 other siblings, most passed at older ages, many of whom had some type of cancer  . Heart attack Neg Hx     Social History:  reports that he quit smoking about 42 years ago. His smoking use included cigarettes. He has a 0.20 pack-year smoking history. he has never used smokeless tobacco. He reports  that he does not drink alcohol or use drugs.  Allergies: No Active Allergies  Medications: Scheduled: Continuous:  No results found for this or any previous visit (from the past 24 hour(s)).   No results found.  ROS:  As stated above in the HPI otherwise negative.  Blood pressure (!) 165/86, pulse 78, temperature 97.6 F (36.4 C), temperature source Oral, resp. rate 20, height 5\' 10"  (1.778 m), weight 119.3 kg (263 lb), SpO2 93 %.    PE: Gen: NAD, Alert and Oriented HEENT:  [place]/AT, EOMI Neck: Supple, no LAD Lungs: CTA Bilaterally CV: RRR without M/G/R ABM: Soft, NTND, +BS Ext: No C/C/E  Assessment/Plan: 1) Personal history of colon cancer - colonoscopy.  Jackelynn Hosie D 09/15/2017, 9:10 AM

## 2017-09-15 NOTE — Op Note (Signed)
[facility] Patient Name: Steven Barnett Procedure Date: 09/15/2017 MRN: [id number] Attending MD: Carol Ada , MD Date of Birth: 01/28/1951 CSN: [id number] Age: 67 Admit Type: Outpatient Procedure:                Colonoscopy Indications:              High risk colon cancer surveillance: Personal                            history of colon cancer Providers:                Carol Ada, MD, Cleda Daub, RN, Charolette Child,                            Technician, Arnoldo Hooker, CRNA Referring MD:              Medicines:                Propofol per Anesthesia Complications:            No immediate complications. Estimated Blood Loss:     Estimated blood loss: none. Procedure:                Pre-Anesthesia Assessment:                           - Prior to the procedure, a History and Physical                            was performed, and patient medications and                            allergies were reviewed. The patient's tolerance of                            previous anesthesia was also reviewed. The risks                            and benefits of the procedure and the sedation                            options and risks were discussed with the patient.                            All questions were answered, and informed consent                            was obtained. Prior Anticoagulants: The patient has                            taken no previous anticoagulant or antiplatelet                            agents. ASA Grade Assessment: III - A patient with  severe systemic disease. After reviewing the risks                            and benefits, the patient was deemed in                            satisfactory condition to undergo the procedure.                           - Sedation was administered by an anesthesia                            professional. Deep sedation was attained.                           After obtaining informed consent,  the colonoscope                            was passed under direct vision. Throughout the                            procedure, the patient's blood pressure, pulse, and                            oxygen saturations were monitored continuously. The                            EC-[id number] ([id number]) scope was introduced through                            the anus and advanced to the the ileocolonic                            anastomosis. The colonoscopy was performed without                            difficulty. The patient tolerated the procedure                            well. The quality of the bowel preparation was                            good. The terminal ileum and the rectum were                            photographed. Scope In: 9:24:32 AM Scope Out: 9:47:14 AM Scope Withdrawal Time: 0 hours 15 minutes 47 seconds  Total Procedure Duration: 0 hours 22 minutes 42 seconds  Findings:      A 18 mm polyp was found in the sigmoid colon. The polyp was       semi-sessile. The polyp was removed with a hot snare. Resection and       retrieval were complete.      There was evidence of a prior functional end-to-end ileo-colonic       anastomosis in  the ascending colon. This was patent and was       characterized by healthy appearing mucosa. The anastomosis was traversed. Impression:               - One 18 mm polyp in the sigmoid colon, removed                            with a hot snare. Resected and retrieved.                           - Patent functional end-to-end ileo-colonic                            anastomosis, characterized by healthy appearing                            mucosa. Moderate Sedation:      N/A- Per Anesthesia Care Recommendation:           - Patient has a contact number available for                            emergencies. The signs and symptoms of potential                            delayed complications were discussed with the                            patient.  Return to normal activities tomorrow.                            Written discharge instructions were provided to the                            patient.                           - Resume previous diet.                           - Continue present medications.                           - Await pathology results.                           - Repeat colonoscopy in 3 years for surveillance. Procedure Code(s):        --- Professional ---                           [phone], Colonoscopy, flexible; with removal of                            tumor(s), polyp(s), or other lesion(s) by snare                            technique Diagnosis Code(s):        ---  Professional ---                           D12.5, Benign neoplasm of sigmoid colon                           Z85.038, Personal history of other malignant                            neoplasm of large intestine                           Z98.0, Intestinal bypass and anastomosis status CPT copyright 2016 [employer]. All rights reserved. The codes documented in this report are preliminary and upon coder review may  be revised to meet current compliance requirements. Carol Ada, MD Carol Ada, MD 09/15/2017 9:53:28 AM This report has been signed electronically. Number of Addenda: 0

## 2017-09-18 ENCOUNTER — Encounter (HOSPITAL_COMMUNITY): Payer: Self-pay | Admitting: Gastroenterology

## 2017-09-29 ENCOUNTER — Ambulatory Visit (INDEPENDENT_AMBULATORY_CARE_PROVIDER_SITE_OTHER): Payer: Medicare Other | Admitting: Pharmacist

## 2017-09-29 DIAGNOSIS — I482 Chronic atrial fibrillation: Secondary | ICD-10-CM | POA: Diagnosis not present

## 2017-09-29 DIAGNOSIS — I4821 Permanent atrial fibrillation: Secondary | ICD-10-CM

## 2017-09-29 LAB — POCT INR: INR: 2

## 2017-09-29 NOTE — Patient Instructions (Signed)
Continue taking 3 tablets every Monday and Friday, 2 tablets all other days of the week. INR in 2 weeks.

## 2017-10-06 ENCOUNTER — Other Ambulatory Visit: Payer: Self-pay | Admitting: Cardiovascular Disease

## 2017-10-23 DIAGNOSIS — M109 Gout, unspecified: Secondary | ICD-10-CM | POA: Diagnosis not present

## 2017-11-10 ENCOUNTER — Ambulatory Visit: Payer: Medicare Other | Admitting: Internal Medicine

## 2017-12-04 ENCOUNTER — Other Ambulatory Visit: Payer: Self-pay | Admitting: Internal Medicine

## 2017-12-06 ENCOUNTER — Ambulatory Visit: Payer: Medicare Other | Admitting: Physician Assistant

## 2017-12-07 ENCOUNTER — Telehealth: Payer: Self-pay | Admitting: Hematology

## 2017-12-07 ENCOUNTER — Encounter: Payer: Self-pay | Admitting: Physician Assistant

## 2017-12-07 NOTE — Telephone Encounter (Signed)
Spoke w/ pt's daughter, [name].  Rescheduled lab/YF appt from 7/25 to 7/30 @ 1:30/2 pm.

## 2017-12-12 ENCOUNTER — Other Ambulatory Visit: Payer: Self-pay | Admitting: Internal Medicine

## 2017-12-12 DIAGNOSIS — I4821 Permanent atrial fibrillation: Secondary | ICD-10-CM

## 2017-12-25 ENCOUNTER — Ambulatory Visit (INDEPENDENT_AMBULATORY_CARE_PROVIDER_SITE_OTHER): Payer: Medicare Other | Admitting: Pharmacist

## 2017-12-25 DIAGNOSIS — I482 Chronic atrial fibrillation: Secondary | ICD-10-CM

## 2017-12-25 DIAGNOSIS — Z7901 Long term (current) use of anticoagulants: Secondary | ICD-10-CM | POA: Diagnosis not present

## 2017-12-25 DIAGNOSIS — I4821 Permanent atrial fibrillation: Secondary | ICD-10-CM

## 2017-12-25 LAB — POCT INR: INR: 3 (ref 2.0–3.0)

## 2017-12-26 ENCOUNTER — Other Ambulatory Visit: Payer: Self-pay | Admitting: Adult Health

## 2018-01-15 ENCOUNTER — Inpatient Hospital Stay: Payer: Medicare Other | Attending: Hematology

## 2018-01-15 ENCOUNTER — Ambulatory Visit (HOSPITAL_COMMUNITY)
Admission: RE | Admit: 2018-01-15 | Discharge: 2018-01-15 | Disposition: A | Discharge status: Home / Self Care | Payer: Medicare Other | Source: Ambulatory Visit | Attending: Adult Health | Admitting: Adult Health

## 2018-01-15 ENCOUNTER — Encounter (HOSPITAL_COMMUNITY): Payer: Self-pay

## 2018-01-15 ENCOUNTER — Other Ambulatory Visit: Payer: Self-pay | Admitting: Hematology

## 2018-01-15 DIAGNOSIS — I700 Atherosclerosis of aorta: Secondary | ICD-10-CM | POA: Insufficient documentation

## 2018-01-15 DIAGNOSIS — D649 Anemia, unspecified: Secondary | ICD-10-CM | POA: Diagnosis not present

## 2018-01-15 DIAGNOSIS — J90 Pleural effusion, not elsewhere classified: Secondary | ICD-10-CM | POA: Diagnosis not present

## 2018-01-15 DIAGNOSIS — Q458 Other specified congenital malformations of digestive system: Secondary | ICD-10-CM | POA: Insufficient documentation

## 2018-01-15 DIAGNOSIS — K6389 Other specified diseases of intestine: Secondary | ICD-10-CM | POA: Diagnosis not present

## 2018-01-15 DIAGNOSIS — E669 Obesity, unspecified: Secondary | ICD-10-CM | POA: Diagnosis not present

## 2018-01-15 DIAGNOSIS — Z87891 Personal history of nicotine dependence: Secondary | ICD-10-CM | POA: Diagnosis not present

## 2018-01-15 DIAGNOSIS — C182 Malignant neoplasm of ascending colon: Secondary | ICD-10-CM | POA: Diagnosis not present

## 2018-01-15 DIAGNOSIS — R911 Solitary pulmonary nodule: Secondary | ICD-10-CM | POA: Diagnosis not present

## 2018-01-15 DIAGNOSIS — R918 Other nonspecific abnormal finding of lung field: Secondary | ICD-10-CM | POA: Diagnosis not present

## 2018-01-15 DIAGNOSIS — I110 Hypertensive heart disease with heart failure: Secondary | ICD-10-CM | POA: Insufficient documentation

## 2018-01-15 DIAGNOSIS — R590 Localized enlarged lymph nodes: Secondary | ICD-10-CM | POA: Insufficient documentation

## 2018-01-15 DIAGNOSIS — I509 Heart failure, unspecified: Secondary | ICD-10-CM | POA: Diagnosis not present

## 2018-01-15 DIAGNOSIS — C188 Malignant neoplasm of overlapping sites of colon: Secondary | ICD-10-CM | POA: Diagnosis not present

## 2018-01-15 DIAGNOSIS — Z7901 Long term (current) use of anticoagulants: Secondary | ICD-10-CM | POA: Insufficient documentation

## 2018-01-15 DIAGNOSIS — C180 Malignant neoplasm of cecum: Secondary | ICD-10-CM

## 2018-01-15 LAB — FERRITIN: Ferritin: 128 ng/mL (ref 24–336)

## 2018-01-15 LAB — IRON AND TIBC
Iron: 83 ug/dL (ref 42–163)
SATURATION RATIOS: 25 % — AB (ref 42–163)
TIBC: 339 ug/dL (ref 202–409)
UIBC: 256 ug/dL

## 2018-01-15 LAB — COMPREHENSIVE METABOLIC PANEL
ALK PHOS: 120 U/L (ref 38–126)
ALT: 23 U/L (ref 0–44)
ANION GAP: 8 (ref 5–15)
AST: 25 U/L (ref 15–41)
Albumin: 4.2 g/dL (ref 3.5–5.0)
BILIRUBIN TOTAL: 1.4 mg/dL — AB (ref 0.3–1.2)
BUN: 11 mg/dL (ref 8–23)
CALCIUM: 9.5 mg/dL (ref 8.9–10.3)
CO2: 30 mmol/L (ref 22–32)
Chloride: 96 mmol/L — ABNORMAL LOW (ref 98–111)
Creatinine, Ser: 0.96 mg/dL (ref 0.61–1.24)
GFR calc Af Amer: >60 mL/min (ref >60)
GLUCOSE: 80 mg/dL (ref 70–99)
POTASSIUM: 4.5 mmol/L (ref 3.5–5.1)
Sodium: 134 mmol/L — ABNORMAL LOW (ref 135–145)
TOTAL PROTEIN: 8.1 g/dL (ref 6.5–8.1)

## 2018-01-15 LAB — CBC WITH DIFFERENTIAL/PLATELET
BASOS ABS: 0.1 10*3/uL (ref 0.0–0.1)
Basophils Relative: 1 %
EOS PCT: 2 %
Eosinophils Absolute: 0.2 10*3/uL (ref 0.0–0.5)
HCT: 50.4 % — ABNORMAL HIGH (ref 38.4–49.9)
Hemoglobin: 16.2 g/dL (ref 13.0–17.1)
LYMPHS PCT: 10 %
Lymphs Abs: 1 10*3/uL (ref 0.9–3.3)
MCH: 26.4 pg — ABNORMAL LOW (ref 27.2–33.4)
MCHC: 32.1 g/dL (ref 32.0–36.0)
MCV: 82.2 fL (ref 79.3–98.0)
MONO ABS: 0.9 10*3/uL (ref 0.1–0.9)
Monocytes Relative: 9 %
Neutro Abs: 7.3 10*3/uL — ABNORMAL HIGH (ref 1.5–6.5)
Neutrophils Relative %: 78 %
PLATELETS: 172 10*3/uL (ref 140–400)
RBC: 6.13 MIL/uL — AB (ref 4.20–5.82)
RDW: 16.7 % — AB (ref 11.0–14.6)
WBC: 9.4 10*3/uL (ref 4.0–10.3)

## 2018-01-15 LAB — CEA (IN HOUSE-CHCC): CEA (CHCC-IN HOUSE): 2.9 ng/mL (ref 0.00–5.00)

## 2018-01-15 IMAGING — CT CT CHEST W/ CM
2 of 5 series · 13 of 36 positions shown, 16 images · IV contrast (ISOVUE 300)
Comparison: [DATE]

CLINICAL DATA: Followup colon cancer. History of partial colectomy
and chemotherapy.

EXAM:
CT CHEST, ABDOMEN, AND PELVIS WITH CONTRAST
TECHNIQUE: Multidetector CT imaging of the chest, abdomen and pelvis was
performed following the standard protocol during bolus
administration of intravenous contrast.
CONTRAST:  100mL [E4] IOPAMIDOL ([E4]) INJECTION 61%

[Series 2: cap with · axial · 0.98mm/px · z∈[-540,+10]mm · 10 of 136 slices shown, 13 images]
[im 13/136  mediastinal]
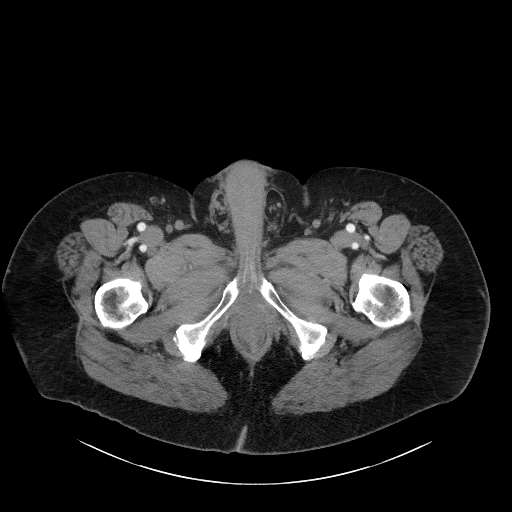
[im 13/136  lung]
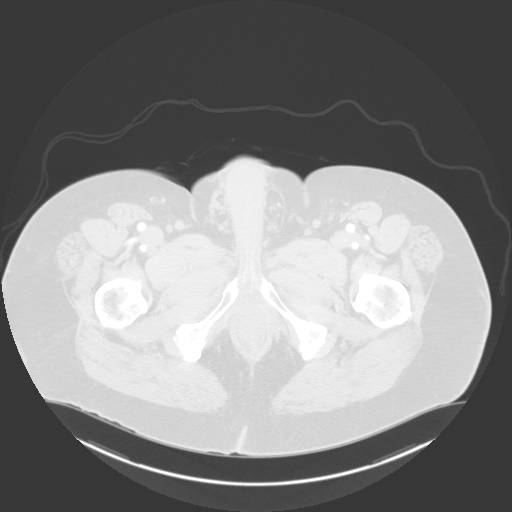
[im 25/136  lung]
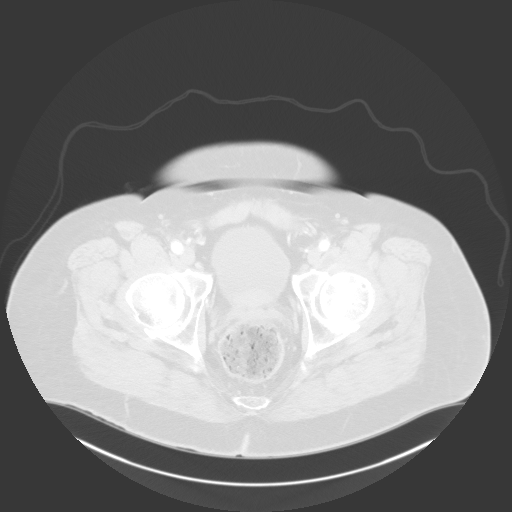
[im 37/136  lung]
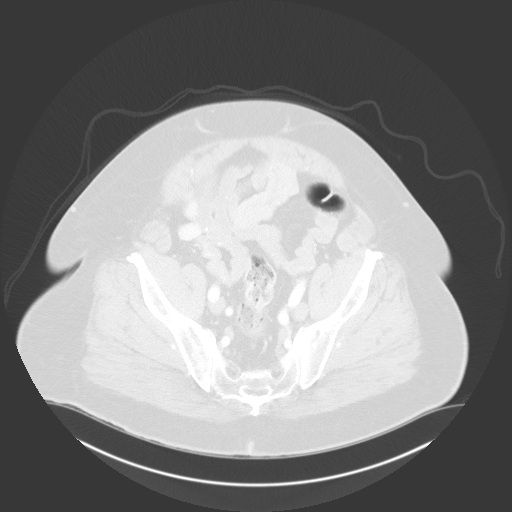
[im 50/136  lung]
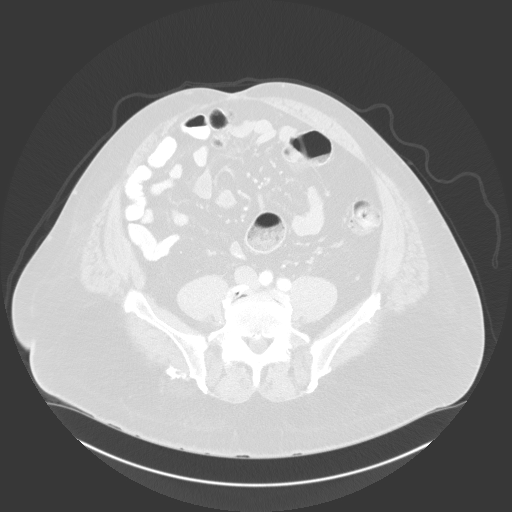
[im 62/136  mediastinal]
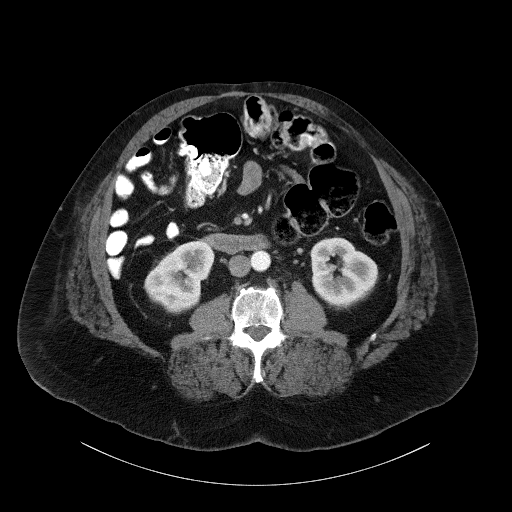
[im 62/136  lung]
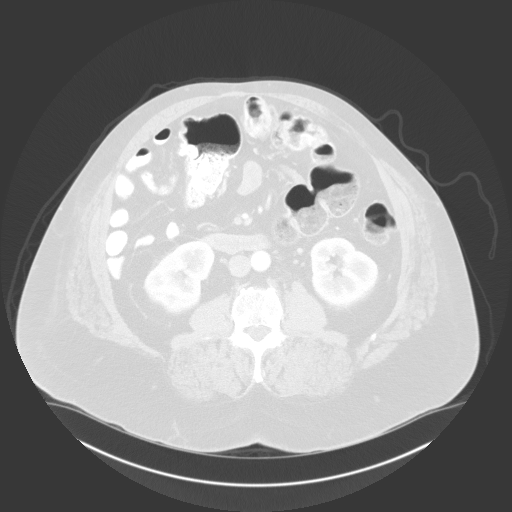
[im 74/136  lung]
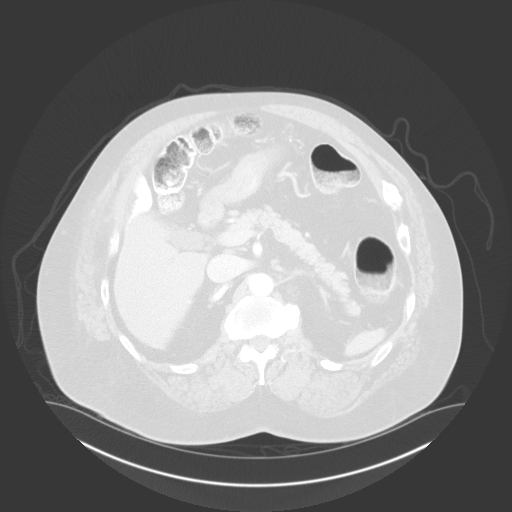
[im 86/136  lung]
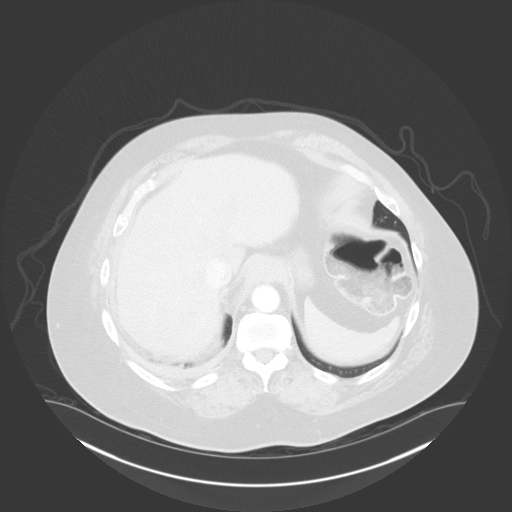
[im 99/136  lung]
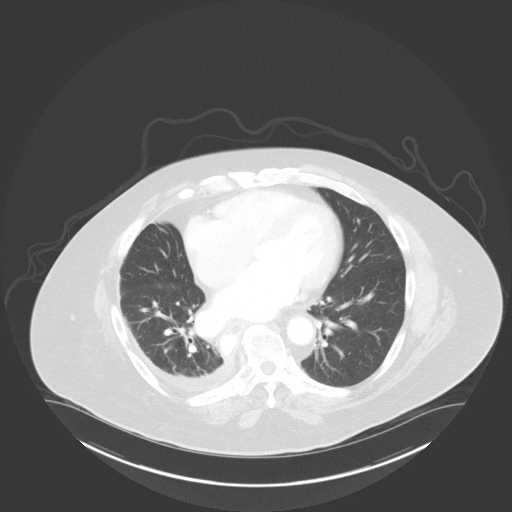
[im 111/136  mediastinal]
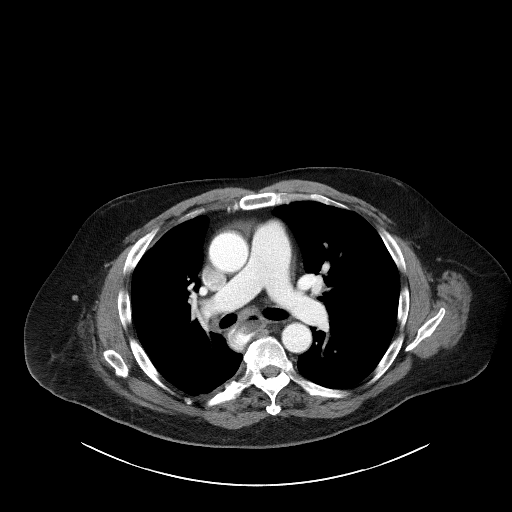
[im 111/136  lung]
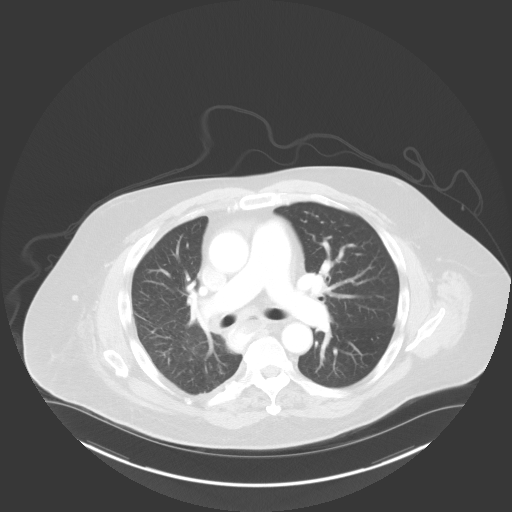
[im 123/136  lung]
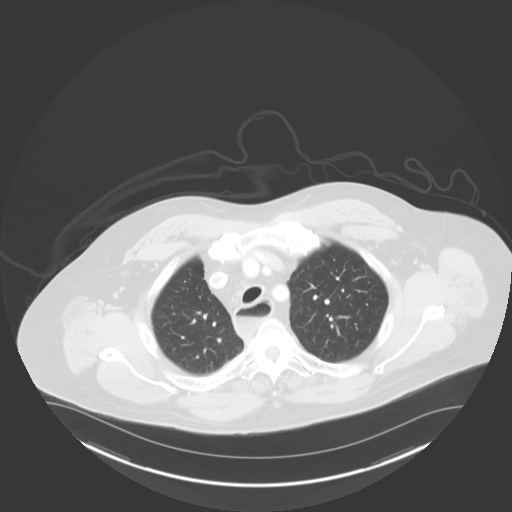

[Series 5: coronals · coronal · 0.94mm/px · 3 of 173 slices shown]
[im 35/173  lung]
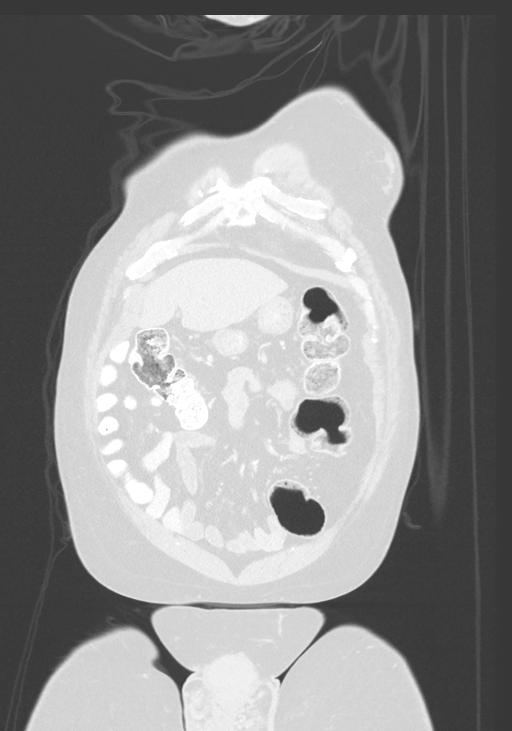
[im 69/173  lung]
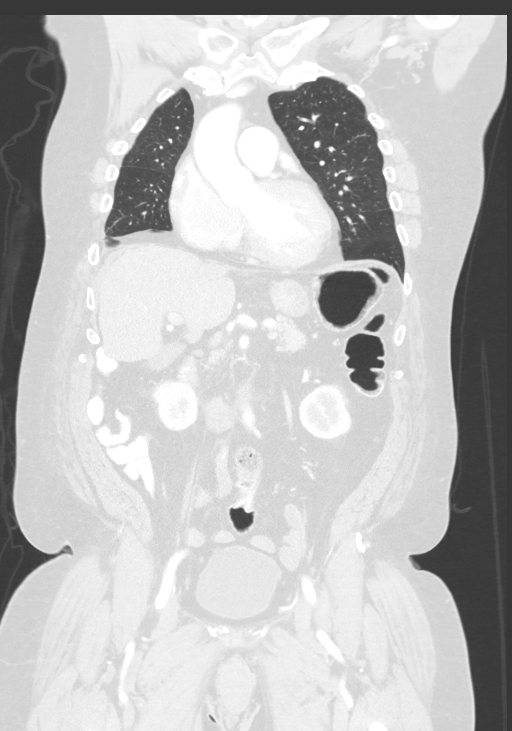
[im 104/173  lung]
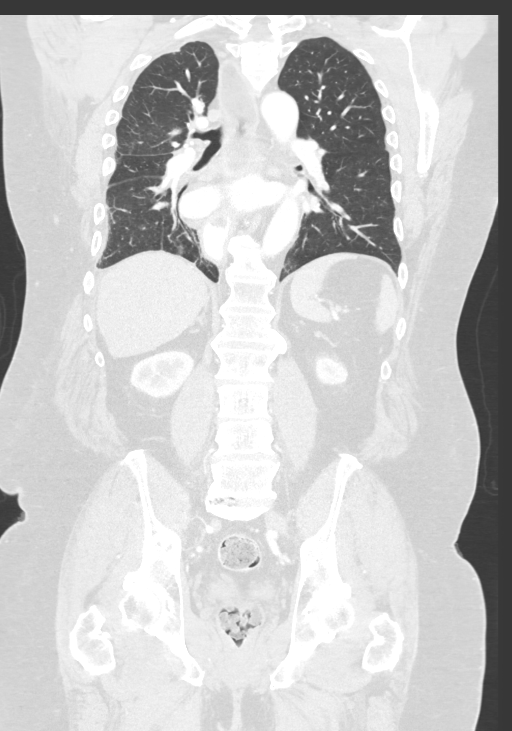

[13 of 36 positions shown; findings below may reference images not displayed]

FINDINGS: CT CHEST FINDINGS

Cardiovascular: The heart size appears normal. There is no
pericardial effusion identified. Aortic atherosclerosis.
Calcification in the left circumflex coronary artery noted.

Mediastinum/Nodes: Normal appearance of the thyroid gland. The
trachea appears patent and is midline. Dilated and patulous
esophagus is stable. Index right paratracheal lymph node is
unchanged measuring 4.9 cm, image [DATE]. The index subcarinal lymph
node is unchanged measuring 1.4 cm, image [DATE]. No hilar adenopathy.
No supraclavicular or axillary adenopathy.

Lungs/Pleura: Chronic right posterior pleural thickening and small
volume loculated pleural fluid is stable from previous exam.
Scattered calcified granulomas noted. Stable parenchymal banding
throughout the right lung base. No suspicious pulmonary nodules
identified.

Musculoskeletal: No aggressive lytic or sclerotic bone lesions.

CT ABDOMEN PELVIS FINDINGS

Hepatobiliary: The contour the liver appears slightly irregular with
a suggestion of mild hypertrophy of the lateral segment left lobe of
liver. No focal liver abnormality is seen. No gallstones,
gallbladder wall thickening, or biliary dilatation.

Pancreas: Unremarkable. No pancreatic ductal dilatation or
surrounding inflammatory changes.

Spleen: Normal in size without focal abnormality.

Adrenals/Urinary Tract: Adrenal glands are unremarkable. Kidneys are
normal, without renal calculi, focal lesion, or hydronephrosis.
Bladder is unremarkable.

Stomach/Bowel: Stomach is within normal limits. Normal appearance of
the small bowel loops. Status post right hemicolectomy with
enterocolonic anastomosis. No wall thickening at the anastomosis.

Vascular/Lymphatic: Aortic atherosclerosis. No aneurysm. No enlarged
abdominal or pelvic lymph nodes.

Reproductive: Prostate is unremarkable.

Other: No abdominal wall hernia or abnormality. No abdominopelvic
ascites.

Musculoskeletal: Degenerative disc disease identified within the
lumbar spine. No aggressive appearing osseous lesions.
IMPRESSION: This

1. No findings identified to suggest local tumor recurrence at the
ileocolic anastomosis. No evidence for distant metastatic disease.
2. Stable mild mediastinal lymphadenopathy since [E4] and considered
benign.
3. Similar appearance of patulous fluid-filled thoracic esophagus.
4. Unchanged right posterior pleural thickening with loculated
pleural fluid.
5.  Aortic Atherosclerosis ([E4]-[E4]).

## 2018-01-15 MED ORDER — IOPAMIDOL (ISOVUE-300) INJECTION 61%
INTRAVENOUS | Status: AC
  Filled 2018-01-15: qty 100

## 2018-01-15 MED ORDER — IOPAMIDOL (ISOVUE-300) INJECTION 61%
100.0000 mL | Freq: Once | INTRAVENOUS | Status: AC | PRN
  Administered 2018-01-15: 100 mL via INTRAVENOUS

## 2018-01-17 ENCOUNTER — Telehealth: Payer: Self-pay

## 2018-01-17 NOTE — Telephone Encounter (Signed)
Spoke with patient's daughter informed iron level normal and CEA normal, no concerns per Dr. Burr Medico.

## 2018-01-17 NOTE — Progress Notes (Signed)
[place]  [phone] Fax:([phone]  Clinic Follow up Note   Patient Care Team: [name], [name] as PCP - General (Family Medicine) [name], MD as Consulting Physician (General Surgery) Pixie Casino, MD as Consulting Physician (Cardiology) 01/22/2018   CHIEF COMPLAINTS:  Follow up stage III colon cancer  Oncology History   Cancer of right colon [facility])   Staging form: Colon and Rectum, AJCC 7th Edition   - Clinical stage from 04/12/2016: Stage IIIA (T2, N1, M0) - Signed by [name], MD on 05/01/2016      Cancer of right colon ([place])   03/04/2016 Procedure    COLONOSCOPY: A polypoid and ulcerated non-obstructing large mass was found in the cecum. The mass was noncircumferential. The mass measured three cm in length. In addition, its diameter measured three mm. (Dr. [name])       03/15/2016 Imaging    1. Polypoid cecal mass. No findings of liver metastatic disease or other definite metastatic disease. There are some small adjacent pericecal lymph nodes which are not pathologically enlarged. 2. Adenopathy in the chest is present but was also present in 2010, albeit slightly less prominent. This may be reactive adenopathy related to the chronic exudative right pleural effusion.       04/07/2016 Tumor Marker    Patient's tumor was tested for the following markers: CEA. Results of the tumor marker test revealed 2.7.      04/12/2016 Initial Diagnosis    Cecal cancer ([facility])      04/12/2016 Definitive Surgery    Laparoscopic right hemicolectomy (Dr. [name])      04/12/2016 Pathologic Stage    pT2 pN1 pMX--Grade 3 adenocarcinoma with 2/16 nodes positive, clear margins, negative for perineural or lymphvascular invasion; invades muscularis propria Loss of expression MLH1/PMS2      06/02/2016 - 09/13/2016 Adjuvant Chemotherapy     Ajuvant chemotherapy FOLFOX, every 2 weeks for 6 cycles (3 months), last cycle postponed due to  hospitalization       06/29/2016 Genetic Testing    POLE c.4523G>A VUS identified on the Colorectal cancer panel.  Negative genetic testing for the MSH2 inversion analysis ([place] inversion). The Colorectal Cancer Panel offered by [facility] includes sequencing and/or duplication/deletion testing of the following 19 genes: APC, ATM, AXIN2, BMPR1A, CDH1, CHEK2, EPCAM, MLH1, MSH2, MSH6, MUTYH, PMS2, POLD1, POLE, PTEN, SCG5/GREM1, SMAD4, STK11, and TP53. The report date is 06/22/2016 for the [facility] panel and 06/29/2016 for the [place] inversion.  POLE c.4523G>A VUS has been reclassified to a likely benign variant based on a combination of sources, e.g, internal data, published literature, population databases and in silico models. The reclassification date is June 09, 2017.       08/17/2016 - 08/20/2016 Hospital Admission    Admit date: 08/17/2016 Admission diagnosis: Acute respiratory failure with hypoxia  Additional comments: Patient was admitted initially with hypoxia and found to have H influenza positive-started on Tamiflu and broad-spectrum antibiotics were D escalate. He was hypotensive on admission given IV saline 75 cc per hour saline lock 2/16 and antihypertensives which were held including lisinopril and Lasix and hydralazine on discharge. Joint hospitalization he continued on his metoprolol twice a day      01/20/2017 Imaging    CT CAP W Contrast 01/20/17 IMPRESSION: 1. No evidence of local tumor recurrence at the ileocolic anastomosis . 2. No findings suspicious for metastatic disease in the chest, abdomen or pelvis. 3. Mild mediastinal lymphadenopathy is stable since 2010 and considered benign .  4. Stable morphologic changes suggestive of cirrhosis. 5. Stable patulous fluid-filled thoracic esophagus. Patchy ground-glass opacity in the posterior right upper lobe is probably inflammatory, possibly due to aspiration. 6. Stable chronic small loculated dependent right pleural effusion.         09/15/2017 Pathology Results    09/15/2017 Surgical Pathology Diagnosis Colon, polyp(s), sigmoid - TUBULAR ADENOMA. - NO HIGH GRADE DYSPLASIA OR MALIGNANCY.      01/15/2018 Imaging    01/15/2018 CT CAP W Contrast IMPRESSION: 1. No findings identified to suggest local tumor recurrence at the ileocolic anastomosis. No evidence for distant metastatic disease. 2. Stable mild mediastinal lymphadenopathy since 2010 and considered benign. 3. Similar appearance of patulous fluid-filled thoracic esophagus. 4. Unchanged right posterior pleural thickening with loculated pleural fluid. 5.  Aortic Atherosclerosis (ICD10-I70.0).      01/15/2018 Tumor Marker    CEA: 04/07/16: 2.7 04/27/2016: 2.11 07/14/2016: 4.25 10/21/2016: 3.60 01/16/17: 3.76 04/25/2017: 5.16 08/28/2017: 2.06 01/15/2018: 2.90        HISTORY OF PRESENTING ILLNESS (04/27/2016):  Steven Barnett 67 y.o. male is here because of his recently diagnosed stage III colon cancer. She is accompanied by her wife to my clinic today.  This was discovered by screening colonoscopy in 03/2016. At the time of his screening colonoscopy, he was found to have anemia and iron deficiency. He has had mild to moderate fatigue and dyspnea on exertion for several months. Colonoscopy showed a large no obstructive mass in the cecum, biopsy showed adenocarcinoma. He was referred to see surgeon Dr. [name] and underwent right hemicolectomy on 04/12/2016. He has been recovering slowly from surgery, still has mild to moderate pain at the incision, his energy level and appetite has not recovered well.   He was hospitalized for CHF in 09/2015 and was found to have pleural effusion, he is on lasix. He is able to do all his ADLs, and light activities, such as house work. He is not very physically active.    CURRENT THERAPY: Surveillance   INTERIM HISTORY:  Mr. Kasler returns for follow-up. He presents to the clinic today with his wife. He feels well. He  complained of mild hematochezia 2 days ago. He had no constipation. The blood was on the toilet paper, not with the stool in the toilet. He has symptoms similar to sleep apnea. He has difficulty sleeping, and uses Xanax. He is on coumadin and reports bruising o his abdomen.  He is leading a sedentary lifestyle. He is retired.     MEDICAL HISTORY:  Past Medical History:  Diagnosis Date  . Anemia   . Arthralgia of both knees   . Arthritis    "right knee" (10/01/2015)  . Atrial fibrillation ([place])   . Cancer ([facility])    STAGE 1 COLON CANCER  . Chronic anticoagulation 2010   Coumadin  . Chronic diastolic CHF (congestive heart failure), NYHA class 2 ([place])   . Dyspnea   . Dysrhythmia   . Hypertension   . OSA (obstructive sleep apnea) 2016   "couldn't take the mask during the testing" (10/01/2015)  . Permanent atrial fibrillation ([place]) 2010  . Pneumonia 3/29/2017and june 2017    SURGICAL HISTORY: Past Surgical History:  Procedure Laterality Date  . COLONOSCOPY WITH PROPOFOL N/A 03/04/2016   Procedure: COLONOSCOPY WITH PROPOFOL;  Surgeon: Carol Ada, MD;  Location: [facility];  Service: Endoscopy;  Laterality: N/A;  . COLONOSCOPY WITH PROPOFOL N/A 09/15/2017   Procedure: COLONOSCOPY WITH PROPOFOL;  Surgeon: Carol Ada, MD;  Location: [facility];  Service: Endoscopy;  Laterality: N/A;  . LAPAROSCOPIC PARTIAL COLECTOMY N/A 04/12/2016   Procedure: LAPAROSCOPIC ILEOCOLECTOMY;  Surgeon: [name], MD;  Location: [facility];  Service: General;  Laterality: N/A;  . PORT-A-CATH REMOVAL N/A 02/16/2017   Procedure: REMOVAL PORT-A-CATH;  Surgeon: [name], MD;  Location: [facility];  Service: General;  Laterality: N/A;  . PORTACATH PLACEMENT N/A 05/18/2016   Procedure: INSERTION PORT-A-CATH;  Surgeon: [name], MD;  Location: [facility];  Service: General;  Laterality: N/A;  . THORACOTOMY Right 2010    SOCIAL HISTORY: Social History   Socioeconomic History  . Marital  status: Married    Spouse name: Ronnette Juniper  . Number of children: Not on file  . Years of education: Not on file  . Highest education level: Not on file  Occupational History  . Not on file  Social Needs  . Financial resource strain: Not on file  . Food insecurity:    Worry: Not on file    Inability: Not on file  . Transportation needs:    Medical: Not on file    Non-medical: Not on file  Tobacco Use  . Smoking status: Former Smoker    Packs/day: 0.10    Years: 2.00    Pack years: 0.20    Types: Cigarettes    Last attempt to quit: 12/16/1974    Years since quitting: 43.1  . Smokeless tobacco: Never Used  . Tobacco comment: "1 pack cigarettes would last me a month"  Substance and Sexual Activity  . Alcohol use: No    Alcohol/week: 0.0 oz    Frequency: Never    Comment: 10/01/2015 "might have1 6 pack of beer/year"  . Drug use: No    Types: Cocaine, Marijuana    Comment: Hx polysubstance abuse, quit 2008  . Sexual activity: Not Currently  Lifestyle  . Physical activity:    Days per week: Not on file    Minutes per session: Not on file  . Stress: Not on file  Relationships  . Social connections:    Talks on phone: Not on file    Gets together: Not on file    Attends religious service: Not on file    Active member of club or organization: Not on file    Attends meetings of clubs or organizations: Not on file    Relationship status: Not on file  . Intimate partner violence:    Fear of current or ex partner: Not on file    Emotionally abused: Not on file    Physically abused: Not on file    Forced sexual activity: Not on file  Other Topics Concern  . 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   FAMILY HISTORY: Family History  Problem Relation Age of Onset  . Heart disease Father   . Heart failure Father   . Colon polyps Father        unspecified number - pt of intestine surgically  resected  . Hypertension Mother   . Diabetes Mother   . Hyperlipidemia Mother   . Colon polyps Mother        unspecified number - pt of intestine surgically resected  . Dementia Mother        d. 41  . Stroke Maternal Grandmother   . Stroke Sister   . Colon cancer Sister 69       w/ "cancerous  polyps" - unspecified number; underwent surgery and radiation  . Colon polyps Brother        unspecified number - polypectomies  . Colon cancer Sister        dx 68-60; s/p surgery and radiation  . Bone cancer Maternal Uncle        dx. 92s  . Lung cancer Paternal Grandfather        d. 80s-90s; lung cancer vs TB  . Colon cancer Sister        dx. 81-60; s/p surgery and radiation  . Cancer Maternal Uncle        mother had about 7-8 other siblings, most passed at older ages, many of whom had some type of cancer  . Heart attack Neg Hx     ALLERGIES:  has No Known Allergies.  MEDICATIONS:  Current Outpatient Medications  Medication Sig Dispense Refill  . acetaminophen (TYLENOL) 500 MG tablet Take 1,000 mg by mouth 2 (two) times daily.    Marland Kitchen albuterol (PROVENTIL HFA;VENTOLIN HFA) 108 (90 Base) MCG/ACT inhaler Inhale 2 puffs into the lungs every 6 (six) hours as needed for wheezing or shortness of breath. 1 Inhaler 2  . aspirin EC 81 MG tablet Take 81 mg by mouth daily.    Marland Kitchen diltiazem (CARDIZEM) 120 MG tablet TAKE 1 TABLET BY MOUTH ONCE DAILY 30 tablet 6  . ferrous sulfate 325 (65 FE) MG tablet Take 1 tablet (325 mg total) by mouth 2 (two) times daily with a meal. 60 tablet 3  . furosemide (LASIX) 80 MG tablet Take 1 tablet (80 mg total) by mouth 2 (two) times daily. 180 tablet 3  . hydrALAZINE (APRESOLINE) 50 MG tablet TAKE 1 TABLET BY MOUTH TWICE DAILY 180 tablet 2  . ipratropium (ATROVENT HFA) 17 MCG/ACT inhaler Inhale 2 puffs into the lungs every 6 (six) hours as needed for wheezing.     Marland Kitchen KLOR-CON M20 20 MEQ tablet TAKE 2 TABLETS BY MOUTH ONCE DAILY (Patient taking differently: TAKE 1 TABLET  BY MOUTH ONCE DAILY) 180 tablet 3  . lisinopril (PRINIVIL,ZESTRIL) 20 MG tablet TAKE ONE TABLET BY MOUTH ONCE DAILY 30 tablet 10  . metoprolol tartrate (LOPRESSOR) 25 MG tablet TAKE 1 TABLET BY MOUTH TWICE DAILY 180 tablet 2  . oxyCODONE (OXY IR/ROXICODONE) 5 MG immediate release tablet Take 1-2 tablets (5-10 mg total) by mouth every 6 (six) hours as needed for moderate pain, severe pain or breakthrough pain. 15 tablet 0  . sildenafil (REVATIO) 20 MG tablet Take 2-3 tablets (40-60 mg total) by mouth daily. 50 tablet 0  . warfarin (COUMADIN) 4 MG tablet Take 1 to 2 tablets by mouth every day as directed by coumadin clinic (Patient taking differently: Take 8 mg by mouth at bedtime. ) 60 tablet 1  . warfarin (COUMADIN) 4 MG tablet Take 1 to 2 tables daily as directed by coumadin clinic. CHECK INR BEFORE NEXT REFILL AUTHORIZATION. 14 tablet 0  . warfarin (COUMADIN) 4 MG tablet Take 2-3 tablets by mouth daily as directed by coumadin clinic 70 tablet 1   No current facility-administered medications for this visit.    Facility-Administered Medications Ordered in Other Visits  Medication Dose Route Frequency Provider Last Rate Last Dose  . heparin lock flush 100 unit/mL  500 Units Intravenous Once PRN [name], MD      . sodium chloride flush (NS) 0.9 % injection 10 mL  10 mL Intravenous PRN [name], MD        REVIEW  OF SYSTEMS:  Constitutional: Denies fevers, chills or abnormal night sweats (+) insomnia Eyes: Denies double vision or watery eyes  Ears, nose, mouth, throat, and face: Denies mucositis or sore throat Respiratory: Denies dyspnea or wheezes   Cardiovascular: Denies palpitation, chest discomfort or lower extremity swelling, Gastrointestinal:  Denies nausea, diarrhea, heartburn (+) blood on toilet paper Skin: Denies abnormal skin rashes  Lymphatics: Denies new lymphadenopathy or easy bruising Musculoskeletal: No joint pain or myalgia Neurological: No tingling or  numbness Behavioral/Psych: Mood is stable, no new changes  All other systems were reviewed with the patient and are negative.  PHYSICAL EXAMINATION: ECOG PERFORMANCE STATUS: 1  Vitals:   01/22/18 1445  BP: (!) 113/95  Pulse: (!) 51  Resp: 18  SpO2: 99%   Filed Weights   01/22/18 1445  Weight: 274 lb 8 oz (124.5 kg)     GENERAL:alert, no distress and comfortable SKIN: skin color, texture, turgor are normal, no rashes or significant lesions EYES: normal, conjunctiva are pink and non-injected, sclera clear OROPHARYNX:no exudate, no erythema and lips, buccal mucosa, and tongue normal  NECK: supple, thyroid normal size, non-tender, without nodularity LYMPH:  no palpable lymphadenopathy in the cervical, axillary or inguinal LUNGS: clear to auscultation and percussion with normal breathing effort HEART: regular rate & rhythm and no murmurs and no lower extremity edema ABDOMEN:abdomen soft, non-tender and normal bowel sounds. (+) midline incision appears healing well, no discharge (+) occasional bruising  Musculoskeletal:no cyanosis of digits and no clubbing  PSYCH: alert & oriented x 3 with fluent speech NEURO: no focal motor/sensory deficits  LABORATORY DATA:  I have reviewed the data as listed CBC Latest Ref Rng & Units 01/15/2018 08/28/2017 08/10/2017  WBC 4.0 - 10.3 K/uL 9.4 8.9 5.3  Hemoglobin 13.0 - 17.1 g/dL 16.2 15.3 15.5  Hematocrit 38.4 - 49.9 % 50.4(H) 47.9 47.3  Platelets 140 - 400 K/uL 172 217 742(H)   CMP Latest Ref Rng & Units 01/15/2018 08/28/2017 08/10/2017  Glucose 70 - 99 mg/dL 80 81 70  BUN 8 - 23 mg/dL _0 Creatinine 0.61 - 1.24 mg/dL 0.96 0.96 0.83  Sodium 135 - 145 mmol/L 134(L) 137 139  Potassium 3.5 - 5.1 mmol/L 4.5 4.1 4.9  Chloride 98 - 111 mmol/L 96(L) 102 98  CO2 22 - 32 mmol/L _1 Calcium 8.9 - 10.3 mg/dL 9.5 8.9 9.3  Total Protein 6.5 - 8.1 g/dL 8.1 7.3 7.3  Total Bilirubin 0.3 - 1.2 mg/dL 1.4(H) 0.8 0.4  Alkaline Phos 38 - 126 U/L 120  152(H) 177(H)  AST 15 - 41 U/L _2 ALT 0 - 44 U/L _3 Tumor Markers CEA: 04/07/16: 2.7 04/27/2016: 2.11 07/14/2016: 4.25 10/21/2016: 3.60 01/16/17: 3.76 04/25/2017: 5.16 08/28/2017: 2.06 01/15/2018: 2.90  PATHOLOGY   09/15/2017 Surgical Pathology Diagnosis Colon, polyp(s), sigmoid - TUBULAR ADENOMA. - NO HIGH GRADE DYSPLASIA OR MALIGNANCY.Specimen Gross and Clinical Information Specimen(s) Obtained: Colon, polyp(s), sigmoid Specimen Clinical Information polyp (kp) Gross The specimen is received in formalin and consists of a 1.4 x 0.9 x 0.7 cm polypoid piece of tan-pink soft tissue. The possible resection margin is inked black, and the specimen is trisected and entirely submitted in two cassettes. (KL:ecj 09/15/2017)  Diagnosis 04/12/2016 Colon, segmental resection for tumor, Right and Terminal Ileum HIGH GRADE ADENOCARCINOMA OF THE CECUM (3 CM), GRADE 3 THE CARCINOMA INVADES MUSCULARIS PROPRIA (PT2) ALL MARGINS OF RESECTION ARE NEGATIVE FOR TUMOR METASTATIC ADENOCARCINOMA IN TWO OF SIXTEEN  LYMPH NODES (2/16) UNREMARKABLE APPENDIX   MMR: (+) loss of MLH1 and PMS2  MSI-H, MLH-1 hypermethylation presents, BRAF mutation (-)  RADIOGRAPHIC STUDIES: I have personally reviewed the radiological images as listed and agreed with the findings in the report.  01/15/2018 CT CAP W Contrast IMPRESSION: 1. No findings identified to suggest local tumor recurrence at the ileocolic anastomosis. No evidence for distant metastatic disease. 2. Stable mild mediastinal lymphadenopathy since 2010 and considered benign. 3. Similar appearance of patulous fluid-filled thoracic esophagus. 4. Unchanged right posterior pleural thickening with loculated pleural fluid. 5.  Aortic Atherosclerosis (ICD10-I70.0).  CT CAP W Contrast 01/20/17 IMPRESSION: 1. No evidence of local tumor recurrence at the ileocolic anastomosis . 2. No findings suspicious for metastatic disease in the  chest, abdomen or pelvis. 3. Mild mediastinal lymphadenopathy is stable since 2010 and considered benign . 4. Stable morphologic changes suggestive of cirrhosis. 5. Stable patulous fluid-filled thoracic esophagus. Patchy ground-glass opacity in the posterior right upper lobe is probably inflammatory, possibly due to aspiration. 6. Stable chronic small loculated dependent right pleural effusion.  Renal US 08/07/2016 IMPRESSION: Bilateral nonobstructive nephrolithiasis. No hydronephrosis is noted.  CT chest, abdomen and pelvis 03/15/2016 IMPRESSION: 1. Polypoid cecal mass. No findings of liver metastatic disease or other definite metastatic disease. There are some small adjacent pericecal lymph nodes which are not pathologically enlarged. 2. Adenopathy in the chest is present but was also present in 2010, albeit slightly less prominent. This may be reactive adenopathy related to the chronic exudative right pleural effusion. 3. Prominent dilatation of the esophagus, nonspecific. Causes might include achalasia, neuropathy, scleroderma, pseudoachalasia, stricture, presbyesophagus, or Chagas disease. 4. Moderate 4 chamber cardiomegaly. 5. Liver morphology is suggestive but not absolutely diagnostic for early cirrhosis. 6.  Prominent stool throughout the colon favors constipation. 7. Left iliopsoas bursitis. 8. Possible varicoceles in the scrotum bilaterally. 9. Impingement at L4-5 due to spondylosis and degenerative disc Disease.  PROCEDURES   09/15/2017 Colonoscopy - One 18 mm polyp in the sigmoid colon, removed with a hot snare. Resected and retrieved. - Patent functional end-to-end ileo-colonic anastomosis, characterized by healthy appearing mucosa.  Colonoscopy 03/04/2016 Dr. [name]  A polypoid and ulcerated non-obstructing large mass was found in the cecum. The mass was noncircumferential. The mass measured three cm in length. In addition, its diameter measured three mm. No  bleeding was present. This was biopsied with a cold forceps for histology. Findings: A 2 mm polyp was found in the transverse colon. The polyp was sessile. The polyp was removed with a cold biopsy forceps. Resection and retrieval were complete. A small ascending colon polyp was identified, image #3, near the mass. This polyp was not removed as it is anticipated that it will be removed with the mass surgically. - Likely malignant tumor in the cecum. Biopsied. - One 2 mm polyp in the transverse colon, removed with a cold biopsy forceps. Resected and retrieved.  ASSESSMENT & PLAN:  67 y.o. male, with past medical history of hypertension, OSA, CHF, obesity, presented with fatigue, dyspnea on exertion, and anemia. Screening colonoscopy showed a cecal mass.  1. Right colon cancer, invasive adenocarcinoma,G3,  pT2N1M0, stage IIIA, MSI-H -I previously reviewed his CT scan findings, and surgical pathology results in great details with patient and her family members -He had a complete surgical resection -His tumor has MSI high, but ML H1 hypermethylation present, supports sporadic colon cancer, 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 -We previously discussed the risk of cancer recurrence after surgery. Due to his stage IIIA disease,  he is at moderate to high risk of recurrence -We previously discussed the standard adjuvant chemotherapy for stage III colon cancer,  reduce the risk of cancer recurrence. -He has completed adjuvant chemotherapy FOLFOX for 3 months -CT scan from 01/20/17 reviewed no evidence of cancer recurrence. Patchy ground-glass opacity in the posterior right upper lobe is probably inflammatory, not very suspicious for metastasis, We'll continue follow up.   - last colonoscopy on 09/15/2017 was normal -I reviewed his surveillance CT scan from 01/15/2018 which showed no evidence of recurrence. He is clinically doing well, lab and exam were unremarkable, no clinical concerns of recurrence  -will continue  colon cancer recurrence, lab and f/u in 6 months   2. HTN, OSA, CHF, obesity -He will continue follow-up with his primary care physician  3. Insomnia -Previously recommended Melatonin.   4. Mild intermittent hyperbilirubinemia  -He has had intermittent slightly elevated total bilirubin, liver enzymes are within normal limits.  -CT scan showed no liver metastasis, the exam was unremarkable.  -Continue monitoring  Plan -F/u and labs in 6 months  -continue surveillance   All questions were answered. The patient knows to call the clinic with any problems, questions or concerns.  I spent 20 minutes counseling the patient face to face. The total time spent in the appointment was 25 minutes and more than 50% was on counseling.  I have reviewed the above documentation for accuracy and completeness, and I agree with the above information.      Dierdre Searles Dweik am acting as scribe for Dr. [name].   [name], MD 01/22/2018

## 2018-01-17 NOTE — Telephone Encounter (Signed)
-----   Message from [name], MD sent at 01/17/2018  7:08 AM EDT ----- Please let pt know that his iron level and CEA were normal, no concerns, thanks  [name]  01/17/2018

## 2018-01-22 ENCOUNTER — Other Ambulatory Visit: Payer: Self-pay | Admitting: Cardiovascular Disease

## 2018-01-22 ENCOUNTER — Ambulatory Visit (INDEPENDENT_AMBULATORY_CARE_PROVIDER_SITE_OTHER): Payer: Medicare Other | Admitting: Pharmacist Clinician (PhC)/ Clinical Pharmacy Specialist

## 2018-01-22 ENCOUNTER — Telehealth: Payer: Self-pay | Admitting: Hematology

## 2018-01-22 ENCOUNTER — Encounter: Payer: Self-pay | Admitting: Hematology

## 2018-01-22 ENCOUNTER — Inpatient Hospital Stay (HOSPITAL_BASED_OUTPATIENT_CLINIC_OR_DEPARTMENT_OTHER): Payer: Medicare Other | Admitting: Hematology

## 2018-01-22 VITALS — BP 113/95 | HR 51 | Resp 18 | Ht 70.0 in | Wt 274.5 lb

## 2018-01-22 DIAGNOSIS — I482 Chronic atrial fibrillation: Secondary | ICD-10-CM

## 2018-01-22 DIAGNOSIS — C182 Malignant neoplasm of ascending colon: Secondary | ICD-10-CM

## 2018-01-22 DIAGNOSIS — D649 Anemia, unspecified: Secondary | ICD-10-CM

## 2018-01-22 DIAGNOSIS — E669 Obesity, unspecified: Secondary | ICD-10-CM | POA: Diagnosis not present

## 2018-01-22 DIAGNOSIS — R911 Solitary pulmonary nodule: Secondary | ICD-10-CM

## 2018-01-22 DIAGNOSIS — Z7901 Long term (current) use of anticoagulants: Secondary | ICD-10-CM

## 2018-01-22 DIAGNOSIS — I509 Heart failure, unspecified: Secondary | ICD-10-CM

## 2018-01-22 DIAGNOSIS — E611 Iron deficiency: Secondary | ICD-10-CM

## 2018-01-22 DIAGNOSIS — I110 Hypertensive heart disease with heart failure: Secondary | ICD-10-CM | POA: Diagnosis not present

## 2018-01-22 DIAGNOSIS — I10 Essential (primary) hypertension: Secondary | ICD-10-CM | POA: Diagnosis not present

## 2018-01-22 DIAGNOSIS — I4821 Permanent atrial fibrillation: Secondary | ICD-10-CM

## 2018-01-22 LAB — POCT INR: INR: 3.6 — AB (ref 2.0–3.0)

## 2018-01-22 NOTE — Telephone Encounter (Signed)
Scheduled appt per 7/22 los - gave patient aVS and calender per los.

## 2018-01-22 NOTE — Patient Instructions (Signed)
Description   No warfarin today Monday July 22, then continue taking 3 tablets every Friday, 2 tablets all other days of the week. INR in 3 weeks.

## 2018-01-23 ENCOUNTER — Other Ambulatory Visit: Payer: Self-pay

## 2018-01-23 MED ORDER — WARFARIN SODIUM 4 MG PO TABS: ORAL_TABLET | ORAL | 2 refills | Status: DC

## 2018-01-25 ENCOUNTER — Other Ambulatory Visit: Payer: Medicare Other

## 2018-01-25 ENCOUNTER — Ambulatory Visit: Payer: Medicare Other | Admitting: Hematology

## 2018-01-30 ENCOUNTER — Ambulatory Visit: Payer: Medicare Other | Admitting: Hematology

## 2018-01-30 ENCOUNTER — Other Ambulatory Visit: Payer: Medicare Other

## 2018-02-20 ENCOUNTER — Other Ambulatory Visit: Payer: Self-pay | Admitting: Internal Medicine

## 2018-02-21 NOTE — Telephone Encounter (Signed)
Rx sent to pharmacy   

## 2018-02-27 ENCOUNTER — Ambulatory Visit: Payer: Medicare Other | Admitting: Internal Medicine

## 2018-02-28 ENCOUNTER — Encounter: Payer: Self-pay | Admitting: *Deleted

## 2018-04-03 DIAGNOSIS — M79641 Pain in right hand: Secondary | ICD-10-CM | POA: Diagnosis not present

## 2018-04-03 DIAGNOSIS — M109 Gout, unspecified: Secondary | ICD-10-CM | POA: Diagnosis not present

## 2018-04-05 ENCOUNTER — Other Ambulatory Visit: Payer: Self-pay | Admitting: Internal Medicine

## 2018-04-05 MED ORDER — POTASSIUM CHLORIDE CRYS ER 20 MEQ PO TBCR: 40.0000 meq | EXTENDED_RELEASE_TABLET | Freq: Every day | ORAL | 1 refills | Status: DC

## 2018-04-05 NOTE — Telephone Encounter (Signed)
New Message:     *STAT* If patient is at the pharmacy, call can be transferred to refill team.   1. Which medications need to be refilled? (please list name of each medication and dose if known) KLOR-CON M20 20 MEQ tablet and sildenafil (REVATIO) 20 MG tablet  2. Which pharmacy/location (including street and city if local pharmacy) is medication to be sent to?  [place], [place].  3. Do they need a 30 day or 90 day supply? 90 Days

## 2018-04-06 ENCOUNTER — Other Ambulatory Visit: Payer: Self-pay | Admitting: *Deleted

## 2018-04-06 MED ORDER — SILDENAFIL CITRATE 20 MG PO TABS: 40.0000 mg | ORAL_TABLET | Freq: Every day | ORAL | 0 refills | Status: DC

## 2018-04-11 ENCOUNTER — Telehealth: Payer: Self-pay | Admitting: Internal Medicine

## 2018-04-11 NOTE — Telephone Encounter (Signed)
Received PA notice from [place] for sildenafil. In the past, patient has obtained Rx from [facility].   Attempted to contact patient to f/up on this and see if OK to send Rx to [facility] as PA is unlikely to be approved for ED.  Patient's home # was not working at time of call. Mobile # listed was for daughter [name]. Did not leave message  MyChart message sent to patient concerning this matter.

## 2018-04-13 MED ORDER — SILDENAFIL CITRATE 20 MG PO TABS: 40.0000 mg | ORAL_TABLET | Freq: Every day | ORAL | 0 refills | Status: DC

## 2018-04-13 NOTE — Telephone Encounter (Addendum)
Called mobile # listed and spoke with daughter (DPR). She states she is not with him. Provided # [phone] to call  Contacted patient @ number provided. Explained that Rx for sildenafil 20mg  would likely not be approved with insurance but he can get med @ [facility] and pay for it without running on insurance. He has gotten med from this pharmacy in the past. Rx(s) sent to pharmacy electronically per patient OK.   Also advised patient he is overdue for appt with MD - scheduled him on 10/16 @ 1145am and will have his INR check moved to same day.

## 2018-04-13 NOTE — Addendum Note (Signed)
Addended by: Fidel Levy on: 04/13/2018 01:44 PM   Modules accepted: Orders

## 2018-04-18 ENCOUNTER — Ambulatory Visit: Payer: Medicare Other | Admitting: Internal Medicine

## 2018-04-24 ENCOUNTER — Ambulatory Visit (INDEPENDENT_AMBULATORY_CARE_PROVIDER_SITE_OTHER): Payer: Medicare Other | Admitting: Internal Medicine

## 2018-04-24 ENCOUNTER — Ambulatory Visit (INDEPENDENT_AMBULATORY_CARE_PROVIDER_SITE_OTHER): Payer: Medicare Other | Admitting: Pharmacist Clinician (PhC)/ Clinical Pharmacy Specialist

## 2018-04-24 ENCOUNTER — Encounter: Payer: Self-pay | Admitting: Internal Medicine

## 2018-04-24 ENCOUNTER — Telehealth: Payer: Self-pay | Admitting: *Deleted

## 2018-04-24 VITALS — BP 130/104 | HR 61 | Ht 70.0 in | Wt 280.8 lb

## 2018-04-24 DIAGNOSIS — I4821 Permanent atrial fibrillation: Secondary | ICD-10-CM | POA: Diagnosis not present

## 2018-04-24 DIAGNOSIS — G4733 Obstructive sleep apnea (adult) (pediatric): Secondary | ICD-10-CM | POA: Diagnosis not present

## 2018-04-24 DIAGNOSIS — I10 Essential (primary) hypertension: Secondary | ICD-10-CM

## 2018-04-24 DIAGNOSIS — G4719 Other hypersomnia: Secondary | ICD-10-CM | POA: Diagnosis not present

## 2018-04-24 LAB — POCT INR: INR: 3.1 — AB (ref 2.0–3.0)

## 2018-04-24 NOTE — Patient Instructions (Signed)
Medication Instructions:  Your physician recommends that you continue on your current medications as directed. Please refer to the Current Medication list given to you today.  If you need a refill on your cardiac medications before your next appointment, please call your pharmacy.   Testing/Procedures: Your physician has recommended that you have a sleep study. This test records several body functions during sleep, including: brain activity, eye movement, oxygen and carbon dioxide blood levels, heart rate and rhythm, breathing rate and rhythm, the flow of air through your mouth and nose, snoring, body muscle movements, and chest and belly movement. --this must approved by insurance prior to scheduling, once approved someone will call to arrange.   Follow-Up: At [facility], you and your health needs are our priority.  As part of our continuing mission to provide you with exceptional heart care, we have created designated Provider Care Teams.  These Care Teams include your primary Cardiologist (physician) and Advanced Practice Providers (APPs -  Physician Assistants and Nurse Practitioners) who all work together to provide you with the care you need, when you need it. You will need a follow up appointment in 12 months.  Please call our office 2 months in advance to schedule this appointment.  You may see Dr. [name] or one of the following Advanced Practice Providers on your designated Care Team: Almyra Deforest, [place] . Fabian Sharp, PA-C

## 2018-04-24 NOTE — Progress Notes (Signed)
[facility]    OFFICE NOTE  Chief Complaint:  Fatigue, poor sleep  Primary Care Physician: Elisabeth Cara, PA-C  HPI:  Steven Barnett is a 67 year old morbidly obese African American male with a history significant for hypertension, sleep disorder consistent with obstructive sleep apnea, recent diagnosis of diastolic heart failure with moderate LVH and recent diagnosis of postoperative atrial fibrillation (continues to be in atrial fibrillation) presenting with atypical chest pain. The patient was hospitalized in May 2010 and diagnosed with a right-sided empyema, for which he underwent a thoracotomy. He was found to be in atrial fibrillation postoperatively and has been in this rhythm ever since. At that time, he was also diagnosed with diastolic heart failure and found to have moderate left ventricular hypertrophy by echocardiogram.   Unfortunately, he was incarcerated for the past several years and has not been followed by cardiologist. His warfarin has been continued while in jail however was not adjusted regularly. He recently saw a new primary care provider with [facility] regional physicians and he is here to reestablish cardiac care and followup of his warfarin. He remains in atrial fibrillation today. He reports shortness of breath which is unchanged.  He denies any chest pain. He does report some leg swelling. He has problems with his knees from arthritis and pain. He also has not been able to lose weight. Finally, he has a history of sleep apnea but was never treated for this. He reports poor sleep at night and daytime fatigue and sometimes feels like he doesn't want to fall asleep because he feels like he is going to stop breathing. There is witnessed apnea.  I saw [name] in the office today. His main complaints are with erectile dysfunction. He is interested in Viagra. Denies any chest pain and reports good sleep. He is therapeutic on warfarin and is in permanent atrial fibrillation.  Mr.  Steven Barnett returns to the office today. He is inquiring about getting generic sildenafil as Viagra was not covered. He also wants to be referred for colonoscopy. I told him that would be up to his primary care provider but he said "I thought you were my primary care provider". I clarified the differences in of encouraged him to follow-up with his primary care provider with [facility] physicians. We will go ahead and refer him for screening colonoscopy. He was advised that he need to hold warfarin for least 5 days prior to colonoscopy.  I saw [name] back in the office today. He was seen this summer by Tarri Fuller, PA-C, for evaluation of cough and shortness of breath. A chest x-ray demonstrated possible pneumonia and was treated with doxycycline. He's not sure if this actually improved his symptoms or not. He recently has been absent for monthly warfarin checks. He says that he was not notified by the office however monthly appointments are scheduled and call also been made. He would like to arrange for a set day for him to come and get his warfarin checked.  [name] returns today for follow-up. He was recently hospitalized with acute on chronic diastolic heart failure. He had significant weight gain and edema with shortness of breath. He was diuresed and discharged in much better condition. He follows up today in the office. He says that he has not been entirely compliant with his medications, due to some confusion. His blood pressure today is 150/90 with a heart rate of 90. He says he's been taking 25 mg or 1 tablet of Lopressor twice daily and  set of 2 tablets twice daily which was recommended. Weight on discharge was 264 and today is 273 although he measured to 69 at home. He denies any worsening swelling or shortness of breath.  [name] returns again today for hospital follow-up. He was last seen by myself on March 1 and that was for hospital follow-up. He is now seen again for another hospital follow-up.  Unfortunately he redeveloped a pneumonia and had acute on chronic congestive heart failure. He had significant diuresis and treated with antibiotics and steroids. He was discharged and reports his breathing has improved. He is now on 80 mg Lasix twice a day. He had confusing directions on prednisone. He has 60 mg tablets but his medication list indicated 10 mg tablets. He was advised to taper down, but he did not how to do that with the 60 mg tablets. Currently he is taking 20 mg 3 times a day. Weight is actually 2 pounds lower than discharge.  12/03/2015  [name] was seen back today in the office for follow-up. His heart rate was elevated today 124 and he reports he ran out of his metoprolol despite there being an active order in the pharmacy. We checked on that today. He apparently is taking diltiazem 90 mg daily. His INR was also recheck today and is subtherapeutic at 1.4. This is concerning for ongoing medication noncompliance. Spiked the increased heart rate he denies any worsening shortness of breath or chest pain.  04/06/2016  [name] was seen in the office today for follow-up. He is doing much better now that we've reinstituted his medicines. Blood pressure is well-controlled at 118/82 today. Heart rate is in well-controlled at 64 and he is in atrial fibrillation. Warfarin was assessed as well with an INR checked today. Unfortunately he recently was found to have a colorectal cancer by Dr. [name]. He is scheduled for a laparoscopic ileocolectomy by Dr. [name] on 04/12/2016.   04/24/2018  [name] returns today for follow-up.  Is been 2 years since I last saw him.  He is been followed in the Coumadin clinic and INR was therapeutic today.  He had undergone surgery for colon cancer in 2017 with adjuvant chemotherapy.  Follow-up afterwards has been reassuring with low CEAs and no evidence of recurrence.  He denies chest pain or worsening shortness of breath.  He has had no heart failure  hospitalizations.  His main complaint is daytime fatigue and sleepiness.  He says he has trouble falling asleep but generally uses Xanax that is not his prescription.  He does have a history of obstructive sleep apnea but was apparently intolerant of the mask.  The study was performed years ago and we discussed the possibility of repeating the study today based on the newer and more tolerable equipment available.  PMHx:  Past Medical History:  Diagnosis Date  . Anemia   . Arthralgia of both knees   . Arthritis    "right knee" (10/01/2015)  . Atrial fibrillation ([place])   . Cancer (HCC)    STAGE 1 COLON CANCER  . Chronic anticoagulation 2010   Coumadin  . Chronic diastolic CHF (congestive heart failure), NYHA class 2 ([place])   . Dyspnea   . Dysrhythmia   . Hypertension   . OSA (obstructive sleep apnea) 2016   "couldn't take the mask during the testing" (10/01/2015)  . Permanent atrial fibrillation ([place]) 2010  . Pneumonia 3/29/2017and june 2017    Past Surgical History:  Procedure Laterality Date  .  COLONOSCOPY WITH PROPOFOL N/A 03/04/2016   Procedure: COLONOSCOPY WITH PROPOFOL;  Surgeon: Carol Ada, MD;  Location: [facility];  Service: Endoscopy;  Laterality: N/A;  . COLONOSCOPY WITH PROPOFOL N/A 09/15/2017   Procedure: COLONOSCOPY WITH PROPOFOL;  Surgeon: Carol Ada, MD;  Location: [facility];  Service: Endoscopy;  Laterality: N/A;  . LAPAROSCOPIC PARTIAL COLECTOMY N/A 04/12/2016   Procedure: LAPAROSCOPIC ILEOCOLECTOMY;  Surgeon: [name], MD;  Location: [place];  Service: General;  Laterality: N/A;  . PORT-A-CATH REMOVAL N/A 02/16/2017   Procedure: REMOVAL PORT-A-CATH;  Surgeon: [name], MD;  Location: [place];  Service: General;  Laterality: N/A;  . PORTACATH PLACEMENT N/A 05/18/2016   Procedure: INSERTION PORT-A-CATH;  Surgeon: [name], MD;  Location: [place];  Service: General;  Laterality: N/A;  . THORACOTOMY Right 2010    FAMHx:  Family  History  Problem Relation Age of Onset  . Heart disease Father   . Heart failure Father   . Colon polyps Father        unspecified number - pt of intestine surgically resected  . Hypertension Mother   . Diabetes Mother   . Hyperlipidemia Mother   . Colon polyps Mother        unspecified number - pt of intestine surgically resected  . Dementia Mother        d. 75  . Stroke Maternal Grandmother   . Stroke Sister   . Colon cancer Sister 14       w/ "cancerous polyps" - unspecified number; underwent surgery and radiation  . Colon polyps Brother        unspecified number - polypectomies  . Colon cancer Sister        dx 23-60; s/p surgery and radiation  . Bone cancer Maternal Uncle        dx. 57s  . Lung cancer Paternal Grandfather        d. 80s-90s; lung cancer vs TB  . Colon cancer Sister        dx. 43-60; s/p surgery and radiation  . Cancer Maternal Uncle        mother had about 7-8 other siblings, most passed at older ages, many of whom had some type of cancer  . Heart attack Neg Hx     SOCHx:   reports that he quit smoking about 43 years ago. His smoking use included cigarettes. He has a 0.20 pack-year smoking history. He has never used smokeless tobacco. He reports that he does not drink alcohol or use drugs.  ALLERGIES:  No Known Allergies  ROS: Pertinent items noted in HPI and remainder of comprehensive ROS otherwise negative.  HOME MEDS: Current Outpatient Medications  Medication Sig Dispense Refill  . acetaminophen (TYLENOL) 500 MG tablet Take 1,000 mg by mouth 2 (two) times daily.    [facility] Kitchen albuterol (PROVENTIL HFA;VENTOLIN HFA) 108 (90 Base) MCG/ACT inhaler Inhale 2 puffs into the lungs every 6 (six) hours as needed for wheezing or shortness of breath. 1 Inhaler 2  . amoxicillin (AMOXIL) 500 MG capsule Take 1 capsule by mouth 3 (three) times daily.    Marland Kitchen aspirin EC 81 MG tablet Take 81 mg by mouth daily.    . colchicine 0.6 MG tablet Take 1 tablet by mouth 2 (two)  times daily.    Marland Kitchen diltiazem (CARDIZEM) 120 MG tablet TAKE 1 TABLET BY MOUTH ONCE DAILY 30 tablet 6  . furosemide (LASIX) 80 MG tablet Take 1 tablet (80 mg total) by mouth 2 (  two) times daily. 180 tablet 3  . hydrALAZINE (APRESOLINE) 50 MG tablet TAKE 1 TABLET BY MOUTH TWICE DAILY 180 tablet 2  . ipratropium (ATROVENT HFA) 17 MCG/ACT inhaler Inhale 2 puffs into the lungs every 6 (six) hours as needed for wheezing.     Marland Kitchen lisinopril (PRINIVIL,ZESTRIL) 20 MG tablet Take 1 tablet (20 mg total) by mouth daily. Keep appointment 30 tablet 3  . metoprolol tartrate (LOPRESSOR) 25 MG tablet TAKE 1 TABLET BY MOUTH TWICE DAILY 180 tablet 2  . potassium chloride SA (K-DUR,KLOR-CON) 20 MEQ tablet Take 20 mEq by mouth daily.    . sildenafil (REVATIO) 20 MG tablet Take 2-3 tablets (40-60 mg total) by mouth daily. 50 tablet 0  . warfarin (COUMADIN) 4 MG tablet Take 1 to 2 tables daily as directed by coumadin clinic. CHECK INR BEFORE NEXT REFILL AUTHORIZATION. 14 tablet 0   No current facility-administered medications for this visit.    Facility-Administered Medications Ordered in Other Visits  Medication Dose Route Frequency Provider Last Rate Last Dose  . heparin lock flush 100 unit/mL  500 Units Intravenous Once PRN [name], MD      . sodium chloride flush (NS) 0.9 % injection 10 mL  10 mL Intravenous PRN [name], MD        LABS/IMAGING: No results found for this or any previous visit (from the past 48 hour(s)). No results found.  VITALS: There were no vitals taken for this visit.  EXAM: General appearance: alert, no distress and morbidly obese Neck: no carotid bruit and no JVD Lungs: clear to auscultation bilaterally Heart: irregularly irregular rhythm Abdomen: soft, non-tender; bowel sounds normal; no masses,  no organomegaly Extremities: edema trace pitting edema Pulses: 2+ and symmetric Skin: Skin color, texture, turgor normal. No rashes or lesions Neurologic: Grossly normal Psych:  Pleasant  EKG: A. fib at 62-personally reviewed  ASSESSMENT: 1. Permanent atrial fibrillation - anticoagulated on warfarin, CHADSVASC score of 3 2. Morbid obesity 3. Obstructive sleep apnea-not on CPAP 4. Hypertension controlled 5. Leg edema 6. Bilateral knee osteoarthritis 7. Erectile dysfunction 8. Recent diastolic congestive heart failure 9. Recent pneumonia 10. H/o medication noncompliance  PLAN: 1.   Mr. Glogowski reports significant daytime fatigue and somnolence.  He is using Xanax at night.  He often naps during the day.  He does have a history of obstructive sleep apnea but is been intolerant of CPAP in the past.  His sleep study was many years ago.  I like for [place] to repeat a split-night study with the new equipment available hopefully would be better tolerated.  His blood pressure was elevated today however did come down some to 124/90.  We will make no adjustments on his medications today.  His weight continues to trend up.  Over this past year he is up about 16 pounds.  He does not have any significant edema that was on exam today.  I encouraged him to continue to work on more activity and weight loss however he is very sedentary.  Follow-up with me annually or sooner as necessary.  Pixie Casino, MD, [facility], [place] Director of the Advanced Lipid Disorders &  Cardiovascular Risk Reduction Clinic Diplomate of the American Board of Clinical Lipidology Attending Cardiologist  Direct Dial: [phone]  Fax: [phone]  Website:  [email or web address] 04/24/2018, 8:36 AM

## 2018-04-24 NOTE — Telephone Encounter (Signed)
Left a message to return a call to get sleep study appointment details.

## 2018-04-25 NOTE — Addendum Note (Signed)
Addended by: Fidel Levy on: 04/25/2018 11:03 AM   Modules accepted: Orders

## 2018-04-26 NOTE — Telephone Encounter (Signed)
Daughter informed of sleep study appointment scheduled for 06/06/18 @ [facility].

## 2018-05-12 ENCOUNTER — Other Ambulatory Visit: Payer: Self-pay | Admitting: Cardiovascular Disease

## 2018-05-21 ENCOUNTER — Ambulatory Visit (INDEPENDENT_AMBULATORY_CARE_PROVIDER_SITE_OTHER): Payer: Medicare HMO | Admitting: Pharmacist

## 2018-05-21 DIAGNOSIS — I4821 Permanent atrial fibrillation: Secondary | ICD-10-CM

## 2018-05-21 DIAGNOSIS — Z7901 Long term (current) use of anticoagulants: Secondary | ICD-10-CM

## 2018-05-21 LAB — POCT INR: INR: 3.2 — AB (ref 2.0–3.0)

## 2018-06-04 ENCOUNTER — Ambulatory Visit (INDEPENDENT_AMBULATORY_CARE_PROVIDER_SITE_OTHER): Payer: Medicare HMO | Admitting: Pharmacist Clinician (PhC)/ Clinical Pharmacy Specialist

## 2018-06-04 DIAGNOSIS — I4821 Permanent atrial fibrillation: Secondary | ICD-10-CM | POA: Diagnosis not present

## 2018-06-04 LAB — POCT INR: INR: 2.4 (ref 2.0–3.0)

## 2018-06-04 NOTE — Patient Instructions (Signed)
Description   Continue taking 2 tables daily. INR in 2 weeks.

## 2018-06-06 ENCOUNTER — Ambulatory Visit (HOSPITAL_BASED_OUTPATIENT_CLINIC_OR_DEPARTMENT_OTHER): Payer: Medicare HMO | Attending: Internal Medicine | Admitting: Cardiovascular Disease

## 2018-06-06 VITALS — Ht 70.0 in | Wt 270.0 lb

## 2018-06-06 DIAGNOSIS — Z79899 Other long term (current) drug therapy: Secondary | ICD-10-CM | POA: Insufficient documentation

## 2018-06-06 DIAGNOSIS — E669 Obesity, unspecified: Secondary | ICD-10-CM | POA: Insufficient documentation

## 2018-06-06 DIAGNOSIS — R5383 Other fatigue: Secondary | ICD-10-CM | POA: Diagnosis not present

## 2018-06-06 DIAGNOSIS — Z7982 Long term (current) use of aspirin: Secondary | ICD-10-CM | POA: Insufficient documentation

## 2018-06-06 DIAGNOSIS — G4719 Other hypersomnia: Secondary | ICD-10-CM | POA: Diagnosis present

## 2018-06-06 DIAGNOSIS — G4733 Obstructive sleep apnea (adult) (pediatric): Secondary | ICD-10-CM | POA: Insufficient documentation

## 2018-06-06 DIAGNOSIS — I4821 Permanent atrial fibrillation: Secondary | ICD-10-CM | POA: Insufficient documentation

## 2018-06-06 DIAGNOSIS — I509 Heart failure, unspecified: Secondary | ICD-10-CM | POA: Insufficient documentation

## 2018-06-06 DIAGNOSIS — I493 Ventricular premature depolarization: Secondary | ICD-10-CM | POA: Diagnosis not present

## 2018-06-06 DIAGNOSIS — G4710 Hypersomnia, unspecified: Secondary | ICD-10-CM | POA: Diagnosis not present

## 2018-06-06 DIAGNOSIS — Z6839 Body mass index (BMI) 39.0-39.9, adult: Secondary | ICD-10-CM | POA: Insufficient documentation

## 2018-06-06 DIAGNOSIS — Z7901 Long term (current) use of anticoagulants: Secondary | ICD-10-CM | POA: Diagnosis not present

## 2018-06-06 DIAGNOSIS — R0902 Hypoxemia: Secondary | ICD-10-CM | POA: Diagnosis not present

## 2018-06-06 DIAGNOSIS — G4736 Sleep related hypoventilation in conditions classified elsewhere: Secondary | ICD-10-CM | POA: Diagnosis not present

## 2018-06-17 ENCOUNTER — Encounter (HOSPITAL_BASED_OUTPATIENT_CLINIC_OR_DEPARTMENT_OTHER): Payer: Self-pay | Admitting: Cardiovascular Disease

## 2018-06-17 NOTE — Procedures (Signed)
Patient Name: Steven Barnett, Steven Barnett Date: 06/06/2018 Gender: Male D.O.B: Jun 24, 1951 Age (years): 67 Referring Provider: [name] Hilty Height (inches): 70 Interpreting Physician: Shelva Majestic MD, ABSM Weight (lbs): 270 RPSGT: Laren Everts BMI: 39 MRN: [id number] Neck Size: 18.00  CLINICAL INFORMATION Sleep Study Type: NPSG  Indication for sleep study: Congestive Heart Failure, Excessive Daytime Sleepiness, Fatigue, Obesity, OSA, Re-Evaluation, Snoring, Witnessed Apneas  Epworth Sleepiness Score: 12  Most recent polysomnogram dated 04/01/2015 revealed an AHI of 83.1/h and RDI of 102.9/h.  SLEEP STUDY TECHNIQUE As per the AASM Manual for the Scoring of Sleep and Associated Events v2.3 (April 2016) with a hypopnea requiring 4% desaturations.  The channels recorded and monitored were frontal, central and occipital EEG, electrooculogram (EOG), submentalis EMG (chin), nasal and oral airflow, thoracic and abdominal wall motion, anterior tibialis EMG, snore microphone, electrocardiogram, and pulse oximetry.  MEDICATIONS     acetaminophen (TYLENOL) 500 MG tablet             albuterol (PROVENTIL HFA;VENTOLIN HFA) 108 (90 Base) MCG/ACT inhaler         amoxicillin (AMOXIL) 500 MG capsule         aspirin EC 81 MG tablet         colchicine 0.6 MG tablet         diltiazem (CARDIZEM) 120 MG tablet         furosemide (LASIX) 80 MG tablet         hydrALAZINE (APRESOLINE) 50 MG tablet         ipratropium (ATROVENT HFA) 17 MCG/ACT inhaler         lisinopril (PRINIVIL,ZESTRIL) 20 MG tablet         metoprolol tartrate (LOPRESSOR) 25 MG tablet         potassium chloride SA (K-DUR,KLOR-CON) 20 MEQ tablet         sildenafil (REVATIO) 20 MG tablet         warfarin (COUMADIN) 4 MG tablet         warfarin (COUMADIN) 4 MG tablet      Medications self-administered by patient taken the night of the study : N/A  SLEEP ARCHITECTURE The study was initiated at 10:13:37 PM and ended at 5:03:12  AM.  Sleep onset time was 16.5 minutes and the sleep efficiency was 33.5%%. The total sleep time was 137 minutes.  Stage REM latency was 162.0 minutes.  The patient spent 27.0%% of the night in stage N1 sleep, 46.4%% in stage N2 sleep, 0.0%% in stage N3 and 26.6% in REM.  Alpha intrusion was absent.  Supine sleep was 71.18%.  RESPIRATORY PARAMETERS The overall apnea/hypopnea index (AHI) was 51.7 per hour. The respiratory index (RDI) was 54.3/h. There were 61 total apneas, including 55 obstructive, 6 central and 0 mixed apneas. There were 57 hypopneas and 6 RERAs.  The AHI during Stage REM sleep was 49.3 per hour.  AHI while supine was 59.1 per hour.  The mean oxygen saturation was 92.6%. The minimum SpO2 during sleep was 80.0%.  Moderate snoring was noted during this study.  CARDIAC DATA The 2 lead EKG demonstrated atrial fibrillation. The mean heart rate was 62.0 beats per minute. Other EKG findings include: PVCs.  LEG MOVEMENT DATA The total PLMS were 0 with a resulting PLMS index of 0.0. Associated arousal with leg movement index was 0.9 .  IMPRESSIONS - Severe obstructive sleep apnea occurred during this study (AHI 51.7/h). - No significant central sleep apnea occurred during this study (  CAI = 2.6/h). - Moderate oxygen desaturation to a nadir of 80%. - The patient snored with moderate snoring volume. - Reduced sleep efficiency at 33.5%. Split night criteria was not met due to an insufficient amount of sleep. - Abnormal sleep architecture with absent slow wave sleep and prolonged latency to REM sleep. - EKG findings include PVCs. - Clinically significant periodic limb movements did not occur during sleep. No significant associated arousals.  DIAGNOSIS - Obstructive Sleep Apnea (327.23 [G47.33 ICD-10]) - Nocturnal Hypoxemia (327.26 [G47.36 ICD-10])  RECOMMENDATIONS - Expeditious therapeutic CPAP titration to determine optimal pressure required to alleviate sleep  disordered breathing. - Effort should be made to optimize nasal and oropharyngeal patency. - Positional therapy avoiding supine position during sleep. - Avoid alcohol, sedatives and other CNS depressants that may worsen sleep apnea and disrupt normal sleep architecture. - Sleep hygiene should be reviewed to assess factors that may improve sleep quality. - Weight management (BMI 39) and regular exercise should be initiated or continued if appropriate.  [Electronically signed] 06/17/2018 06:13 PM  Shelva Majestic MD, [facility], ABSM Diplomate, American Board of Sleep Medicine   NPI: [id number] [place] PH: [phone]   FX: [phone] [place]

## 2018-06-18 ENCOUNTER — Telehealth: Payer: Self-pay | Admitting: *Deleted

## 2018-06-18 ENCOUNTER — Other Ambulatory Visit: Payer: Self-pay | Admitting: Cardiovascular Disease

## 2018-06-18 DIAGNOSIS — I10 Essential (primary) hypertension: Secondary | ICD-10-CM

## 2018-06-18 DIAGNOSIS — G4736 Sleep related hypoventilation in conditions classified elsewhere: Secondary | ICD-10-CM

## 2018-06-18 DIAGNOSIS — IMO0002 Reserved for concepts with insufficient information to code with codable children: Secondary | ICD-10-CM

## 2018-06-18 DIAGNOSIS — G4733 Obstructive sleep apnea (adult) (pediatric): Secondary | ICD-10-CM

## 2018-06-18 NOTE — Telephone Encounter (Signed)
-----   Message from Troy Sine, MD sent at 06/17/2018  6:20 PM EST ----- [name], please notify pt and schedule for expeditious CPAP titration.

## 2018-06-18 NOTE — Telephone Encounter (Signed)
Patient's daughter ,[name] informed of sleep study results and recommendations. She agrees to have her dad proceed with the titration study.

## 2018-06-19 ENCOUNTER — Telehealth: Payer: Self-pay | Admitting: *Deleted

## 2018-06-19 NOTE — Telephone Encounter (Signed)
Patient daughter notified of CPAP titration appointment scheduled for 07/01/18. [facility] Auth # [id number]. Valid 06/22/18 to 07/22/18.

## 2018-06-19 NOTE — Telephone Encounter (Signed)
-----   Message from Lauralee Evener, CMA sent at 06/18/2018  4:39 PM EST ----- CPAP titration

## 2018-07-01 ENCOUNTER — Ambulatory Visit (HOSPITAL_BASED_OUTPATIENT_CLINIC_OR_DEPARTMENT_OTHER): Payer: Medicare HMO | Attending: Cardiovascular Disease

## 2018-07-10 ENCOUNTER — Ambulatory Visit (INDEPENDENT_AMBULATORY_CARE_PROVIDER_SITE_OTHER): Payer: Medicare HMO | Admitting: Pharmacist Clinician (PhC)/ Clinical Pharmacy Specialist

## 2018-07-10 DIAGNOSIS — I4821 Permanent atrial fibrillation: Secondary | ICD-10-CM | POA: Diagnosis not present

## 2018-07-10 LAB — POCT INR: INR: 3 (ref 2.0–3.0)

## 2018-07-27 ENCOUNTER — Inpatient Hospital Stay: Payer: Medicare HMO | Admitting: Hematology

## 2018-07-27 ENCOUNTER — Inpatient Hospital Stay: Payer: Medicare HMO

## 2018-07-30 ENCOUNTER — Ambulatory Visit (INDEPENDENT_AMBULATORY_CARE_PROVIDER_SITE_OTHER): Payer: Medicare HMO | Admitting: *Deleted

## 2018-07-30 DIAGNOSIS — I4821 Permanent atrial fibrillation: Secondary | ICD-10-CM

## 2018-07-30 LAB — POCT INR: INR: 2.2 (ref 2.0–3.0)

## 2018-07-30 NOTE — Progress Notes (Signed)
[place]   Telephone:(336) [phone] Fax:(336) [phone]   Clinic Follow up Note   Patient Care Team: [name], [name] as PCP - General (Family Medicine) [name], MD as Consulting Physician (General Surgery) [name] [name], MD as Consulting Physician (Cardiology)  Date of Service:  08/01/2018  CHIEF COMPLAINT: Follow up stage III colon cancer  SUMMARY OF ONCOLOGIC HISTORY: Oncology History   Cancer of right colon [facility])   Staging form: Colon and Rectum, AJCC 7th Edition   - Clinical stage from 04/12/2016: Stage IIIA (T2, N1, M0) - Signed by [name], MD on 05/01/2016      Cancer of right colon ([place])   03/04/2016 Procedure    COLONOSCOPY: A polypoid and ulcerated non-obstructing large mass was found in the cecum. The mass was noncircumferential. The mass measured three cm in length. In addition, its diameter measured three mm. (Dr. [name])     03/04/2016 Initial Biopsy    Diagnosis 1. Colon, biopsy, cecal - ADENOCARCINOMA. 2. Colon, polyp(s), transverse - BENIGN COLONIC MUCOSA WITH BENIGN LYMPHOID AGGREGATE. - NO ADENOMATOUS CHANGE OR MALIGNANCY IDENTIFIED. Microscopic Comment 1. The biopsies are involved by moderately to poorly differentiated colorectal type adenocarcinoma.    03/04/2016 Initial Diagnosis    Cecal cancer ([place])    03/15/2016 Imaging    1. Polypoid cecal mass. No findings of liver metastatic disease or other definite metastatic disease. There are some small adjacent pericecal lymph nodes which are not pathologically enlarged. 2. Adenopathy in the chest is present but was also present in 2010, albeit slightly less prominent. This may be reactive adenopathy related to the chronic exudative right pleural effusion.     04/07/2016 Tumor Marker    Patient's tumor was tested for the following markers: CEA. Results of the tumor marker test revealed 2.7.    04/12/2016 Definitive Surgery    Laparoscopic right hemicolectomy (Dr.  [name])    04/12/2016 Pathologic Stage    pT2 pN1 pMX--Grade 3 adenocarcinoma with 2/16 nodes positive, clear margins, negative for perineural or lymphvascular invasion; invades muscularis propria Loss of expression MLH1/PMS2    06/02/2016 - 09/13/2016 Adjuvant Chemotherapy     Ajuvant chemotherapy FOLFOX, every 2 weeks for 6 cycles (3 months), last cycle postponed due to hospitalization     06/29/2016 Genetic Testing    POLE [id number] VUS identified on the Colorectal cancer panel.  Negative genetic testing for the MSH2 inversion analysis ([place] inversion). The Colorectal Cancer Panel offered by [facility] includes sequencing and/or duplication/deletion testing of the following 19 genes: APC, ATM, AXIN2, BMPR1A, CDH1, CHEK2, EPCAM, MLH1, MSH2, MSH6, MUTYH, PMS2, POLD1, POLE, PTEN, SCG5/GREM1, SMAD4, STK11, and TP53. The report date is 06/22/2016 for the [facility] panel and 06/29/2016 for the [place] inversion.  POLE c.[id number] VUS has been reclassified to a likely benign variant based on a combination of sources, e.g, internal data, published literature, population databases and in silico models. The reclassification date is June 09, 2017.     08/17/2016 - 08/20/2016 Hospital Admission    Admit date: 08/17/2016 Admission diagnosis: Acute respiratory failure with hypoxia  Additional comments: Patient was admitted initially with hypoxia and found to have H influenza positive-started on Tamiflu and broad-spectrum antibiotics were D escalate. He was hypotensive on admission given IV saline 75 cc per hour saline lock 2/16 and antihypertensives which were held including lisinopril and Lasix and hydralazine on discharge. Joint hospitalization he continued on his metoprolol twice a day    01/20/2017 Imaging    CT  CAP W Contrast 01/20/17 IMPRESSION: 1. No evidence of local tumor recurrence at the ileocolic anastomosis . 2. No findings suspicious for metastatic disease in the chest, abdomen or pelvis. 3. Mild  mediastinal lymphadenopathy is stable since 2010 and considered benign . 4. Stable morphologic changes suggestive of cirrhosis. 5. Stable patulous fluid-filled thoracic esophagus. Patchy ground-glass opacity in the posterior right upper lobe is probably inflammatory, possibly due to aspiration. 6. Stable chronic small loculated dependent right pleural effusion.     09/15/2017 Pathology Results    09/15/2017 Surgical Pathology Diagnosis Colon, polyp(s), sigmoid - TUBULAR ADENOMA. - NO HIGH GRADE DYSPLASIA OR MALIGNANCY.    01/15/2018 Imaging    01/15/2018 CT CAP W Contrast IMPRESSION: 1. No findings identified to suggest local tumor recurrence at the ileocolic anastomosis. No evidence for distant metastatic disease. 2. Stable mild mediastinal lymphadenopathy since 2010 and considered benign. 3. Similar appearance of patulous fluid-filled thoracic esophagus. 4. Unchanged right posterior pleural thickening with loculated pleural fluid. 5.  Aortic Atherosclerosis (ICD10-I70.0).    01/15/2018 Tumor Marker    CEA: 04/07/16: 2.7 04/27/2016: 2.11 07/14/2016: 4.25 10/21/2016: 3.60 01/16/17: 3.76 04/25/2017: 5.16 08/28/2017: 2.06 01/15/2018: 2.90       CURRENT THERAPY:  Surveillance  INTERVAL HISTORY:  Steven Barnett is here for a follow up of colon cancer. He was last seen by me 6 months ago. He presents to the clinic today bu himself. He notes he is doing well. He notes his BP is around 140/85 when he does check it at [place]. He is on Cardizem, hydralazine, lisinopril and metoprolol. He is also on coumadin. He denies headaches, pain. He denies bleeding in stool but has some bruising.      REVIEW OF SYSTEMS:   Constitutional: Denies fevers, chills or abnormal weight loss Eyes: Denies blurriness of vision Ears, nose, mouth, throat, and face: Denies mucositis or sore throat Respiratory: Denies cough, dyspnea or wheezes Cardiovascular: Denies palpitation, chest discomfort or lower  extremity swelling Gastrointestinal:  Denies nausea, heartburn or change in bowel habits Skin: Denies abnormal skin rashes Lymphatics: Denies new lymphadenopathy (+) mild bruising of skin  Neurological:Denies numbness, tingling or new weaknesses Behavioral/Psych: Mood is stable, no new changes  All other systems were reviewed with the patient and are negative.  MEDICAL HISTORY:  Past Medical History:  Diagnosis Date  . Anemia   . Arthralgia of both knees   . Arthritis    "right knee" (10/01/2015)  . Atrial fibrillation ([place])   . Cancer (H[facility])    STAGE 1 COLON CANCER  . Chronic anticoagulation 2010   Coumadin  . Chronic diastolic CHF (congestive heart failure), NYHA class 2 ([place])   . Dyspnea   . Dysrhythmia   . Hypertension   . OSA (obstructive sleep apnea) 2016   "couldn't take the mask during the testing" (10/01/2015)  . Permanent atrial fibrillation 2010  . Pneumonia 3/29/2017and june 2017    SURGICAL HISTORY: Past Surgical History:  Procedure Laterality Date  . COLONOSCOPY WITH PROPOFOL N/A 03/04/2016   Procedure: COLONOSCOPY WITH PROPOFOL;  Surgeon: Carol Ada, MD;  Location: [facility];  Service: Endoscopy;  Laterality: N/A;  . COLONOSCOPY WITH PROPOFOL N/A 09/15/2017   Procedure: COLONOSCOPY WITH PROPOFOL;  Surgeon: Carol Ada, MD;  Location: [facility];  Service: Endoscopy;  Laterality: N/A;  . LAPAROSCOPIC PARTIAL COLECTOMY N/A 04/12/2016   Procedure: LAPAROSCOPIC ILEOCOLECTOMY;  Surgeon: [name], MD;  Location: [place];  Service: General;  Laterality: N/A;  . PORT-A-CATH REMOVAL N/A 02/16/2017  Procedure: REMOVAL PORT-A-CATH;  Surgeon: [name], MD;  Location: [place];  Service: General;  Laterality: N/A;  . PORTACATH PLACEMENT N/A 05/18/2016   Procedure: INSERTION PORT-A-CATH;  Surgeon: [name], MD;  Location: [place];  Service: General;  Laterality: N/A;  . THORACOTOMY Right 2010    I have reviewed the social history and family  history with the patient and they are unchanged from previous note.  ALLERGIES:  has No Known Allergies.  MEDICATIONS:  Current Outpatient Medications  Medication Sig Dispense Refill  . acetaminophen (TYLENOL) 500 MG tablet Take 1,000 mg by mouth 2 (two) times daily.    Marland Kitchen albuterol (PROVENTIL HFA;VENTOLIN HFA) 108 (90 Base) MCG/ACT inhaler Inhale 2 puffs into the lungs every 6 (six) hours as needed for wheezing or shortness of breath. 1 Inhaler 2  . aspirin EC 81 MG tablet Take 81 mg by mouth daily.    Marland Kitchen diltiazem (CARDIZEM) 120 MG tablet TAKE 1 TABLET BY MOUTH ONCE DAILY 30 tablet 6  . furosemide (LASIX) 80 MG tablet Take 1 tablet (80 mg total) by mouth 2 (two) times daily. 180 tablet 3  . hydrALAZINE (APRESOLINE) 50 MG tablet TAKE 1 TABLET BY MOUTH TWICE DAILY 180 tablet 2  . ipratropium (ATROVENT HFA) 17 MCG/ACT inhaler Inhale 2 puffs into the lungs every 6 (six) hours as needed for wheezing.     Marland Kitchen lisinopril (PRINIVIL,ZESTRIL) 20 MG tablet Take 1 tablet (20 mg total) by mouth daily. Keep appointment 30 tablet 3  . metoprolol tartrate (LOPRESSOR) 25 MG tablet TAKE 1 TABLET BY MOUTH TWICE DAILY 180 tablet 2  . potassium chloride SA (K-DUR,KLOR-CON) 20 MEQ tablet Take 20 mEq by mouth daily.    Marland Kitchen warfarin (COUMADIN) 4 MG tablet TAKE 2 TO 3 TABLETS BY MOUTH ONCE DAILY AS DIRECTED BY  COUMADIN  CLINIC (Patient taking differently: 8 mg daily. Take 2 tablets daily) 75 tablet 5   No current facility-administered medications for this visit.    Facility-Administered Medications Ordered in Other Visits  Medication Dose Route Frequency Provider Last Rate Last Dose  . heparin lock flush 100 unit/mL  500 Units Intravenous Once PRN [name], MD      . sodium chloride flush (NS) 0.9 % injection 10 mL  10 mL Intravenous PRN [name], MD        PHYSICAL EXAMINATION: ECOG PERFORMANCE STATUS: 1 - Symptomatic but completely ambulatory  Vitals:   08/01/18 1501 08/01/18 1507  BP: (!) 149/97 (!) 125/106   Pulse: 61   Resp: 16   Temp: 98.2 F (36.8 C)   SpO2: 95%    Filed Weights   08/01/18 1501  Weight: 284 lb (128.8 kg)    GENERAL:alert, no distress and comfortable SKIN: skin color, texture, turgor are normal, no rashes or significant lesions EYES: normal, Conjunctiva are pink and non-injected, sclera clear OROPHARYNX:no exudate, no erythema and lips, buccal mucosa, and tongue normal  NECK: supple, thyroid normal size, non-tender, without nodularity LYMPH:  no palpable lymphadenopathy in the cervical, axillary or inguinal LUNGS: clear to auscultation and percussion with normal breathing effort HEART: regular rate & rhythm and no murmurs and no lower extremity edema ABDOMEN:abdomen soft, non-tender and normal bowel sounds (+) skin ecchymosis with signs of mild bleeding on upper abdomen. (+) surgical incision healed very well Musculoskeletal:no cyanosis of digits and no clubbing  NEURO: alert & oriented x 3 with fluent speech, no focal motor/sensory deficits  LABORATORY DATA:  I have reviewed the data  as listed CBC Latest Ref Rng & Units 08/01/2018 01/15/2018 08/28/2017  WBC 4.0 - 10.5 K/uL 10.1 9.4 8.9  Hemoglobin 13.0 - 17.0 g/dL 15.7 16.2 15.3  Hematocrit 39.0 - 52.0 % 49.8 50.4(H) 47.9  Platelets 150 - 400 K/uL 169 172 217     CMP Latest Ref Rng & Units 08/01/2018 01/15/2018 08/28/2017  Glucose 70 - 99 mg/dL 90 80 81  BUN 8 - 23 mg/dL 18 11 14   Creatinine 0.61 - 1.24 mg/dL 0.88 0.96 0.96  Sodium 135 - 145 mmol/L 140 134(L) 137  Potassium 3.5 - 5.1 mmol/L 4.4 4.5 4.1  Chloride 98 - 111 mmol/L 102 96(L) 102  CO2 22 - 32 mmol/L 30 30 28   Calcium 8.9 - 10.3 mg/dL 9.3 9.5 8.9  Total Protein 6.5 - 8.1 g/dL 7.5 8.1 7.3  Total Bilirubin 0.3 - 1.2 mg/dL 0.7 1.4(H) 0.8  Alkaline Phos 38 - 126 U/L 178(H) 120 152(H)  AST 15 - 41 U/L 21 25 15   ALT 0 - 44 U/L 20 23 18       RADIOGRAPHIC STUDIES: I have personally reviewed the radiological images as listed and agreed with the  findings in the report. No results found.   ASSESSMENT & PLAN:  Steven Barnett is a 68 y.o. male with   1. Right colon cancer, invasive adenocarcinoma,G3,  pT2N1M0, stage IIIA, MSI-H -He was diagnosed in 03/2016. He is s/p right hemicolectomy and adjuvant chemo FOLFOX -His last colonoscopy was 09/2017, he had 1 small benign polyp. Overall benign exam. Will repeat in 3-5 years.  -From a cancer standpoint he is clinically doing well. Physical exam unremarkable. Labs reviewed, CMP and CBC WNL.  -surveillance CT scan in 01/2019, which will be his last routine surveillance scan -Continue colon cancer surveillance with 6 months follow ups and labs.    2. HTN, OSA, CHF, obesity -He will continue follow-up with his primary care physician -BP is elevated today, 125/106 (08/01/18).  -He is on Cardizem, hydralazine, lisinopril and metoprolol. -He is on Coumadin, managed by PCP. He does have bruising of his skin with no other sings of bleeding. I encouraged him to avoid injury and bumping into things.  -I discussed his obesity can effect his heart function. I encouraged him to lose weight with change in diet and exercising.   3. Mild intermittent hyperbilirubinemia   -He has had intermittent slightly elevated total bilirubin, liver enzymes are within normal limits.  -CT scan showed no liver metastasis, the exam was unremarkable.  -Continue monitoring -Currently resolved.   Plan -continue surveillance  -F/u in 6 months with lab and CT chest, abd/pel with contrast a few days before    No problem-specific Assessment & Plan notes found for this encounter.   Orders Placed This Encounter  Procedures  . CT Abdomen Pelvis W Contrast    Standing Status:   Future    Standing Expiration Date:   08/01/2019    Order Specific Question:   If indicated for the ordered procedure, I authorize the administration of contrast media per Radiology protocol    Answer:   Yes    Order Specific Question:    Preferred imaging location?    Answer:   [facility]    Order Specific Question:   Is Oral Contrast requested for this exam?    Answer:   Yes, Per Radiology protocol    Order Specific Question:   Radiology Contrast Protocol - do NOT remove file path    Answer:   \\  [email or web address]  . CT Chest W Contrast    Standing Status:   Future    Standing Expiration Date:   08/01/2019    Order Specific Question:   If indicated for the ordered procedure, I authorize the administration of contrast media per Radiology protocol    Answer:   Yes    Order Specific Question:   Preferred imaging location?    Answer:   [facility]    Order Specific Question:   Radiology Contrast Protocol - do NOT remove file path    Answer:   \\[email or web address]   All questions were answered. The patient knows to call the clinic with any problems, questions or concerns. No barriers to learning was detected. I spent 20 minutes counseling the patient face to face. The total time spent in the appointment was 25 minutes and more than 50% was on counseling and review of test results     [name], MD 08/01/2018   I, Joslyn Devon, am acting as scribe for [name], MD.   I have reviewed the above documentation for accuracy and completeness, and I agree with the above.

## 2018-07-30 NOTE — Patient Instructions (Signed)
Description   Continue taking 2 tables daily. INR in 4 weeks.

## 2018-08-01 ENCOUNTER — Encounter: Payer: Self-pay | Admitting: Hematology

## 2018-08-01 ENCOUNTER — Inpatient Hospital Stay (HOSPITAL_BASED_OUTPATIENT_CLINIC_OR_DEPARTMENT_OTHER): Payer: Medicare HMO | Admitting: Hematology

## 2018-08-01 ENCOUNTER — Inpatient Hospital Stay: Payer: Medicare HMO | Attending: Hematology

## 2018-08-01 VITALS — BP 125/106 | HR 61 | Temp 98.2°F | Resp 16 | Ht 70.0 in | Wt 284.0 lb

## 2018-08-01 DIAGNOSIS — E669 Obesity, unspecified: Secondary | ICD-10-CM | POA: Diagnosis not present

## 2018-08-01 DIAGNOSIS — I110 Hypertensive heart disease with heart failure: Secondary | ICD-10-CM | POA: Insufficient documentation

## 2018-08-01 DIAGNOSIS — C182 Malignant neoplasm of ascending colon: Secondary | ICD-10-CM | POA: Diagnosis present

## 2018-08-01 DIAGNOSIS — C180 Malignant neoplasm of cecum: Secondary | ICD-10-CM

## 2018-08-01 LAB — CBC WITH DIFFERENTIAL/PLATELET
Abs Immature Granulocytes: 0.03 10*3/uL (ref 0.00–0.07)
BASOS PCT: 1 %
Basophils Absolute: 0.1 10*3/uL (ref 0.0–0.1)
Eosinophils Absolute: 0.2 10*3/uL (ref 0.0–0.5)
Eosinophils Relative: 2 %
HCT: 49.8 % (ref 39.0–52.0)
Hemoglobin: 15.7 g/dL (ref 13.0–17.0)
Immature Granulocytes: 0 %
Lymphocytes Relative: 10 %
Lymphs Abs: 1 10*3/uL (ref 0.7–4.0)
MCH: 26.9 pg (ref 26.0–34.0)
MCHC: 31.5 g/dL (ref 30.0–36.0)
MCV: 85.3 fL (ref 80.0–100.0)
Monocytes Absolute: 0.8 10*3/uL (ref 0.1–1.0)
Monocytes Relative: 8 %
Neutro Abs: 8.1 10*3/uL — ABNORMAL HIGH (ref 1.7–7.7)
Neutrophils Relative %: 79 %
Platelets: 169 10*3/uL (ref 150–400)
RBC: 5.84 MIL/uL — ABNORMAL HIGH (ref 4.22–5.81)
RDW: 15.9 % — ABNORMAL HIGH (ref 11.5–15.5)
WBC: 10.1 10*3/uL (ref 4.0–10.5)
nRBC: 0 % (ref 0.0–0.2)

## 2018-08-01 LAB — IRON AND TIBC
Iron: 41 ug/dL — ABNORMAL LOW (ref 42–163)
Saturation Ratios: 13 % — ABNORMAL LOW (ref 20–55)
TIBC: 306 ug/dL (ref 202–409)
UIBC: 265 ug/dL (ref 117–376)

## 2018-08-01 LAB — COMPREHENSIVE METABOLIC PANEL
ALT: 20 U/L (ref 0–44)
AST: 21 U/L (ref 15–41)
Albumin: 3.6 g/dL (ref 3.5–5.0)
Alkaline Phosphatase: 178 U/L — ABNORMAL HIGH (ref 38–126)
Anion gap: 8 (ref 5–15)
BUN: 18 mg/dL (ref 8–23)
CALCIUM: 9.3 mg/dL (ref 8.9–10.3)
CO2: 30 mmol/L (ref 22–32)
CREATININE: 0.88 mg/dL (ref 0.61–1.24)
Chloride: 102 mmol/L (ref 98–111)
GFR calc Af Amer: >60 mL/min (ref >60)
GFR calc non Af Amer: >60 mL/min (ref >60)
Glucose, Bld: 90 mg/dL (ref 70–99)
Potassium: 4.4 mmol/L (ref 3.5–5.1)
Sodium: 140 mmol/L (ref 135–145)
TOTAL PROTEIN: 7.5 g/dL (ref 6.5–8.1)
Total Bilirubin: 0.7 mg/dL (ref 0.3–1.2)

## 2018-08-01 LAB — CEA (IN HOUSE-CHCC): CEA (CHCC-In House): 2.82 ng/mL (ref 0.00–5.00)

## 2018-08-01 LAB — FERRITIN: Ferritin: 153 ng/mL (ref 24–336)

## 2018-08-02 ENCOUNTER — Telehealth: Payer: Self-pay | Admitting: Hematology

## 2018-08-02 NOTE — Telephone Encounter (Signed)
Scheduled appt per 01/29 los.  Called patient and patient is aware that he will need a CT scan before his MD visit.  Gave patient the number to central radiology.  Patient stated he was going and schedule the appt the same day as his lab appt on 07/27.

## 2018-08-07 ENCOUNTER — Telehealth: Payer: Self-pay

## 2018-08-07 NOTE — Telephone Encounter (Signed)
-----   Message from [name], MD sent at 08/01/2018 10:33 PM EST ----- Please let pt know his CEA and iron study was normal today, thanks   [name]  08/01/2018

## 2018-08-07 NOTE — Telephone Encounter (Signed)
Spoke with patient's daughter Kailly Richoux [place] regarding his lab results, per Dr. Burr Medico CEA and iron study was normal today, she verbalized an understanding.

## 2018-08-27 ENCOUNTER — Ambulatory Visit (INDEPENDENT_AMBULATORY_CARE_PROVIDER_SITE_OTHER): Payer: Medicare HMO | Admitting: *Deleted

## 2018-08-27 DIAGNOSIS — I4821 Permanent atrial fibrillation: Secondary | ICD-10-CM

## 2018-08-27 LAB — POCT INR: INR: 2.6 (ref 2.0–3.0)

## 2018-08-27 NOTE — Patient Instructions (Signed)
Description   Continue taking 2 tables daily. INR in 6 weeks. Call us with medication changes or concerns [phone].

## 2018-09-18 ENCOUNTER — Other Ambulatory Visit: Payer: Self-pay | Admitting: Internal Medicine

## 2018-09-18 DIAGNOSIS — I4821 Permanent atrial fibrillation: Secondary | ICD-10-CM

## 2018-09-20 ENCOUNTER — Other Ambulatory Visit: Payer: Self-pay | Admitting: *Deleted

## 2018-09-20 MED ORDER — LISINOPRIL 20 MG PO TABS: 20.0000 mg | ORAL_TABLET | Freq: Every day | ORAL | 2 refills | Status: DC

## 2018-10-04 ENCOUNTER — Telehealth: Payer: Self-pay

## 2018-10-04 NOTE — Telephone Encounter (Signed)
lmom for prescreen/drive thru 

## 2018-10-05 NOTE — Telephone Encounter (Signed)
2nd msg for prescreen/drive thru and reschedule for [facility]

## 2018-10-11 ENCOUNTER — Telehealth: Payer: Self-pay

## 2018-10-11 NOTE — Telephone Encounter (Signed)
lmom for prescreen/drive thru 

## 2018-10-12 ENCOUNTER — Ambulatory Visit (INDEPENDENT_AMBULATORY_CARE_PROVIDER_SITE_OTHER): Payer: Medicare HMO | Admitting: *Deleted

## 2018-10-12 ENCOUNTER — Other Ambulatory Visit: Payer: Self-pay

## 2018-10-12 DIAGNOSIS — Z5181 Encounter for therapeutic drug level monitoring: Secondary | ICD-10-CM | POA: Insufficient documentation

## 2018-10-12 DIAGNOSIS — I4821 Permanent atrial fibrillation: Secondary | ICD-10-CM | POA: Diagnosis not present

## 2018-10-12 LAB — POCT INR: INR: 2.8 (ref 2.0–3.0)

## 2018-10-12 NOTE — Patient Instructions (Signed)
Description   Spoke with pt & dtr and instructed pt to continue taking 2 tables daily. INR in 8 weeks. Call us with medication changes or concerns [phone].

## 2018-10-25 ENCOUNTER — Telehealth: Payer: Self-pay

## 2018-10-25 MED ORDER — WARFARIN SODIUM 4 MG PO TABS: ORAL_TABLET | ORAL | 0 refills | Status: DC

## 2018-10-25 NOTE — Telephone Encounter (Signed)
Refill done.  

## 2018-12-04 ENCOUNTER — Telehealth: Payer: Self-pay

## 2018-12-04 NOTE — Telephone Encounter (Signed)

## 2018-12-27 ENCOUNTER — Other Ambulatory Visit: Payer: Self-pay | Admitting: Internal Medicine

## 2019-01-08 ENCOUNTER — Telehealth: Payer: Self-pay | Admitting: Hematology

## 2019-01-08 NOTE — Telephone Encounter (Signed)
Called patient to see if he wants to change her OV to a virtual visit (webex, mychart or doximity). (per 7/7 sch message.  Patient stated he rather come in for his appt.  Left appt as is

## 2019-01-25 ENCOUNTER — Telehealth: Payer: Self-pay

## 2019-01-25 NOTE — Telephone Encounter (Signed)
    COVID-19 Pre-Screening Questions:  . In the past 7 to 10 days have you had a cough,  shortness of breath, headache, congestion, fever (100 or greater) body aches, chills, sore throat, or sudden loss of taste or sense of smell? NO . Have you been around anyone with known Covid 19. NO . Have you been around anyone who is awaiting Covid 19 test results in the past 7 to 10 days? NO . Have you been around anyone who has been exposed to Covid 19, or has mentioned symptoms of Covid 19 within the past 7 to 10 days? NO  If you have any concerns/questions about symptoms patients report during screening (either on the phone or at threshold). Contact the provider seeing the patient or DOD for further guidance.  If neither are available contact a member of the leadership team.    Patient was advised of visitor restrictions (no visitors allowed except if needed to conduct the visit). Also advised to arrive at appointment time and to wear a mask if he/she does not have a mask one will be provided upon entrance. Patient verbalized understanding and all (if any) questions were answered.

## 2019-01-28 ENCOUNTER — Ambulatory Visit (INDEPENDENT_AMBULATORY_CARE_PROVIDER_SITE_OTHER): Payer: Medicare HMO | Admitting: *Deleted

## 2019-01-28 ENCOUNTER — Inpatient Hospital Stay: Payer: Medicare HMO | Attending: Hematology

## 2019-01-28 ENCOUNTER — Ambulatory Visit (HOSPITAL_COMMUNITY)
Admission: RE | Admit: 2019-01-28 | Discharge: 2019-01-28 | Disposition: A | Discharge status: Home / Self Care | Payer: Medicare HMO | Source: Ambulatory Visit | Attending: Hematology | Admitting: Hematology

## 2019-01-28 ENCOUNTER — Other Ambulatory Visit: Payer: Self-pay

## 2019-01-28 DIAGNOSIS — Z5181 Encounter for therapeutic drug level monitoring: Secondary | ICD-10-CM | POA: Diagnosis not present

## 2019-01-28 DIAGNOSIS — I4821 Permanent atrial fibrillation: Secondary | ICD-10-CM | POA: Diagnosis not present

## 2019-01-28 DIAGNOSIS — C182 Malignant neoplasm of ascending colon: Secondary | ICD-10-CM

## 2019-01-28 DIAGNOSIS — I110 Hypertensive heart disease with heart failure: Secondary | ICD-10-CM | POA: Diagnosis not present

## 2019-01-28 DIAGNOSIS — C180 Malignant neoplasm of cecum: Secondary | ICD-10-CM

## 2019-01-28 DIAGNOSIS — E669 Obesity, unspecified: Secondary | ICD-10-CM | POA: Diagnosis not present

## 2019-01-28 LAB — CBC WITH DIFFERENTIAL/PLATELET
Abs Immature Granulocytes: 0.01 10*3/uL (ref 0.00–0.07)
Basophils Absolute: 0.1 10*3/uL (ref 0.0–0.1)
Basophils Relative: 1 %
Eosinophils Absolute: 0.2 10*3/uL (ref 0.0–0.5)
Eosinophils Relative: 3 %
HCT: 51.8 % (ref 39.0–52.0)
Hemoglobin: 16.4 g/dL (ref 13.0–17.0)
Immature Granulocytes: 0 %
Lymphocytes Relative: 16 %
Lymphs Abs: 1 10*3/uL (ref 0.7–4.0)
MCH: 26.6 pg (ref 26.0–34.0)
MCHC: 31.7 g/dL (ref 30.0–36.0)
MCV: 84.1 fL (ref 80.0–100.0)
Monocytes Absolute: 0.6 10*3/uL (ref 0.1–1.0)
Monocytes Relative: 10 %
Neutro Abs: 4.5 10*3/uL (ref 1.7–7.7)
Neutrophils Relative %: 70 %
Platelets: 171 10*3/uL (ref 150–400)
RBC: 6.16 MIL/uL — ABNORMAL HIGH (ref 4.22–5.81)
RDW: 16.9 % — ABNORMAL HIGH (ref 11.5–15.5)
WBC: 6.4 10*3/uL (ref 4.0–10.5)
nRBC: 0 % (ref 0.0–0.2)

## 2019-01-28 LAB — COMPREHENSIVE METABOLIC PANEL
ALT: 17 U/L (ref 0–44)
AST: 20 U/L (ref 15–41)
Albumin: 3.7 g/dL (ref 3.5–5.0)
Alkaline Phosphatase: 119 U/L (ref 38–126)
Anion gap: 7 (ref 5–15)
BUN: 15 mg/dL (ref 8–23)
CO2: 29 mmol/L (ref 22–32)
Calcium: 9.1 mg/dL (ref 8.9–10.3)
Chloride: 101 mmol/L (ref 98–111)
Creatinine, Ser: 1.01 mg/dL (ref 0.61–1.24)
GFR calc Af Amer: >60 mL/min (ref >60)
GFR calc non Af Amer: >60 mL/min (ref >60)
Glucose, Bld: 86 mg/dL (ref 70–99)
Potassium: 4.1 mmol/L (ref 3.5–5.1)
Sodium: 137 mmol/L (ref 135–145)
Total Bilirubin: 1 mg/dL (ref 0.3–1.2)
Total Protein: 7.8 g/dL (ref 6.5–8.1)

## 2019-01-28 LAB — IRON AND TIBC
Iron: 75 ug/dL (ref 42–163)
Saturation Ratios: 24 % (ref 20–55)
TIBC: 311 ug/dL (ref 202–409)
UIBC: 236 ug/dL (ref 117–376)

## 2019-01-28 LAB — FERRITIN: Ferritin: 195 ng/mL (ref 24–336)

## 2019-01-28 LAB — CEA (IN HOUSE-CHCC): CEA (CHCC-In House): 2.5 ng/mL (ref 0.00–5.00)

## 2019-01-28 LAB — POCT INR: INR: 2.3 (ref 2.0–3.0)

## 2019-01-28 IMAGING — CT CT ABDOMEN AND PELVIS WITH CONTRAST
2 of 5 series · 15 of 46 positions shown, 17 images · IV contrast (omnipaque)
Comparison: [DATE]

CLINICAL DATA: Follow-up colon carcinoma. Previous surgery and
adjuvant chemotherapy.

EXAM:
CT CHEST, ABDOMEN, AND PELVIS WITH CONTRAST
TECHNIQUE: Multidetector CT imaging of the chest, abdomen and pelvis was
performed following the standard protocol during bolus
administration of intravenous contrast.
CONTRAST:  100mL OMNIPAQUE IOHEXOL 300 MG/ML  SOLN

[Series 2: cap with · axial · 0.98mm/px · z∈[-675,-140]mm · 12 of 127 slices shown, 14 images]
[im 10/127  soft-tissue]
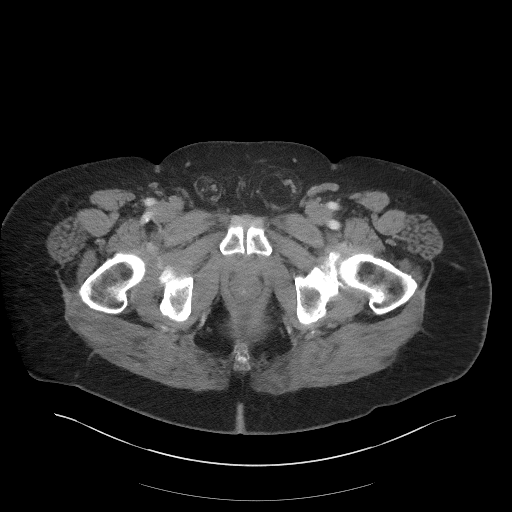
[im 10/127  bone]
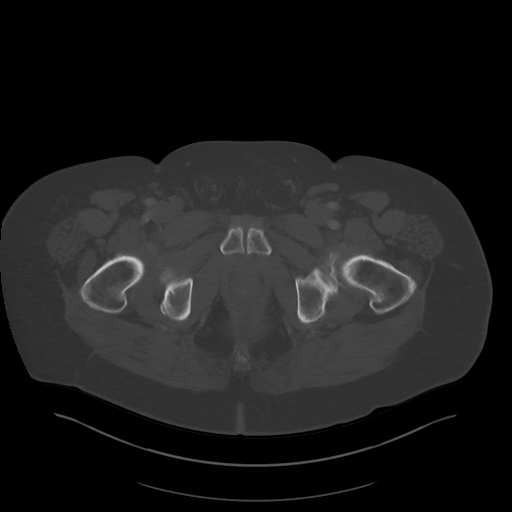
[im 20/127  soft-tissue]
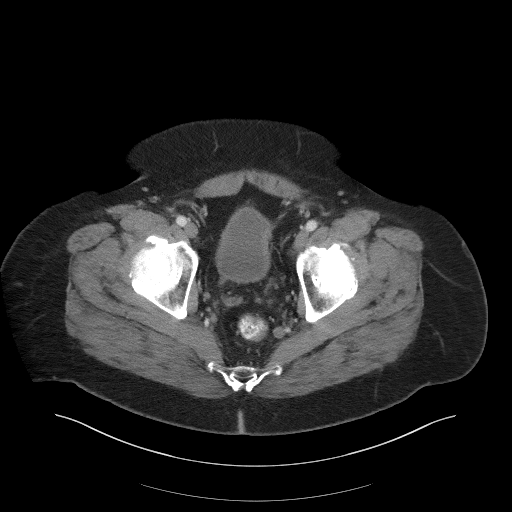
[im 30/127  soft-tissue]
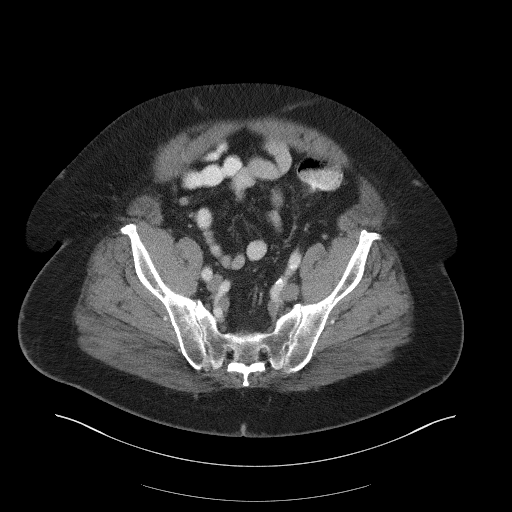
[im 39/127  soft-tissue]
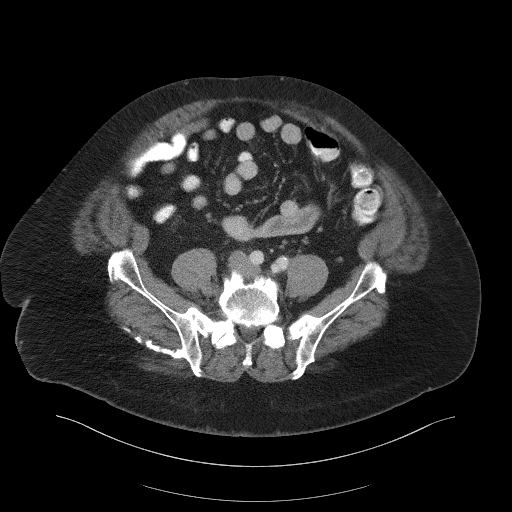
[im 49/127  soft-tissue]
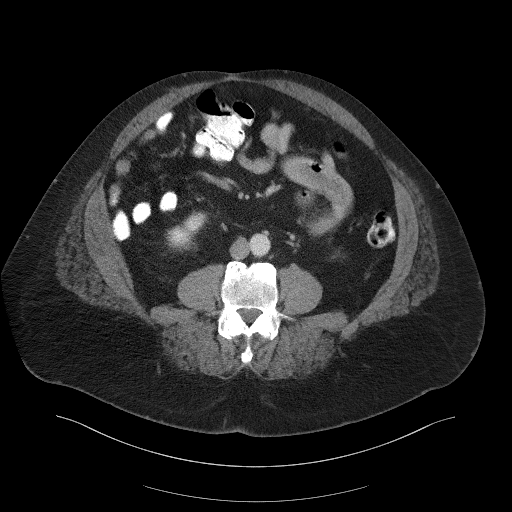
[im 59/127  soft-tissue]
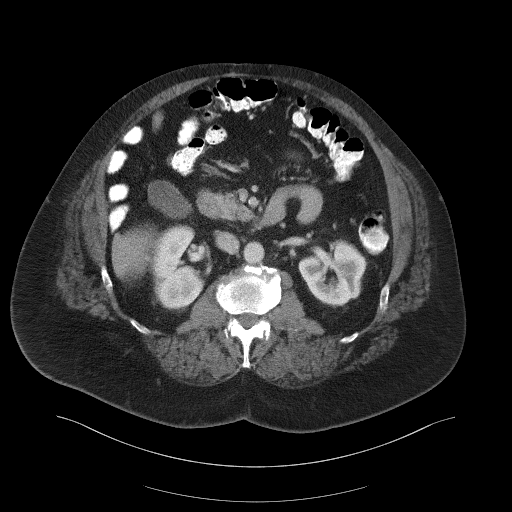
[im 68/127  soft-tissue]
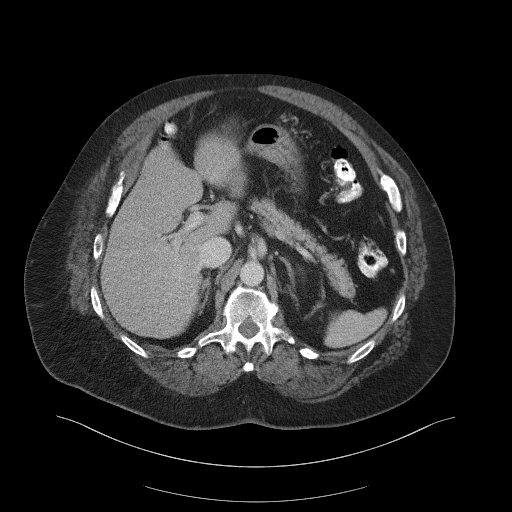
[im 78/127  soft-tissue]
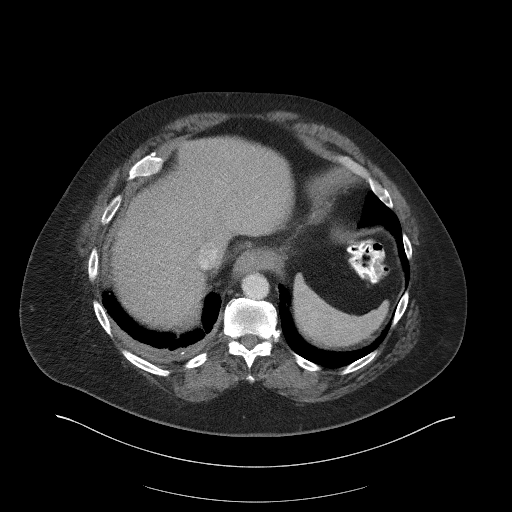
[im 88/127  soft-tissue]
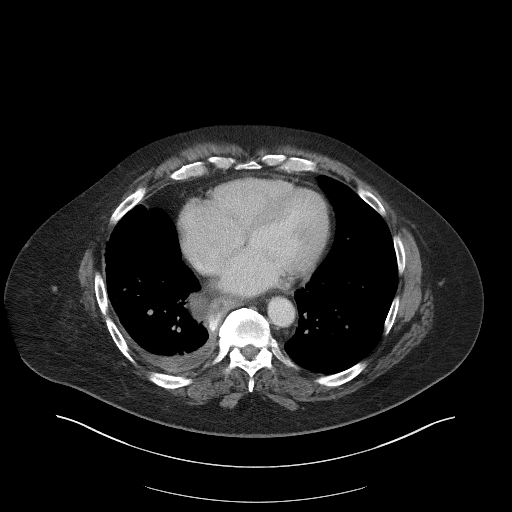
[im 88/127  bone]
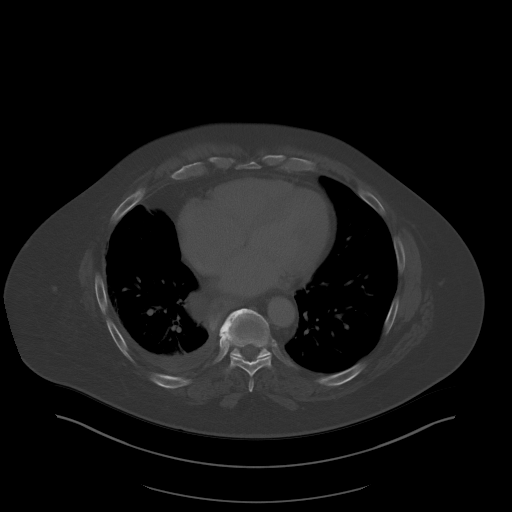
[im 97/127  soft-tissue]
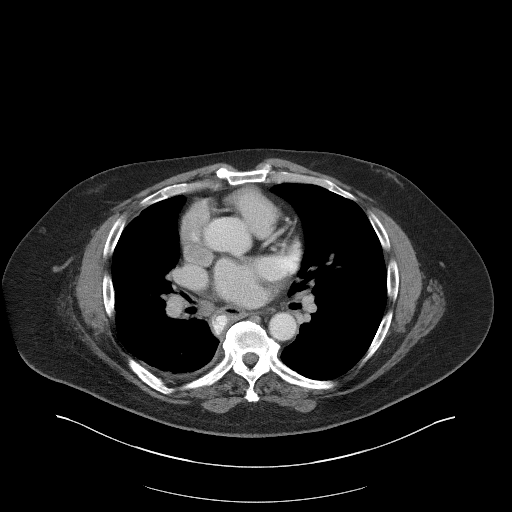
[im 107/127  soft-tissue]
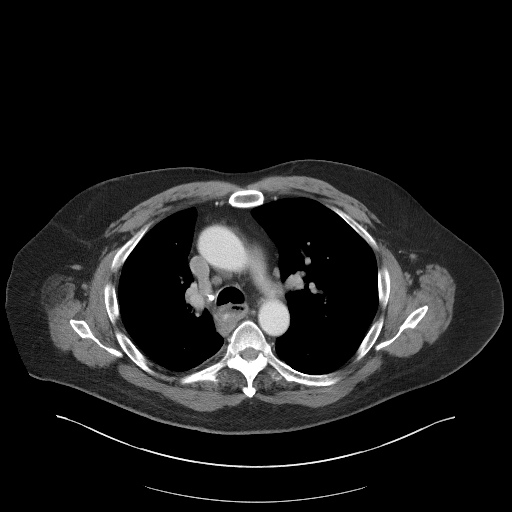
[im 117/127  soft-tissue]
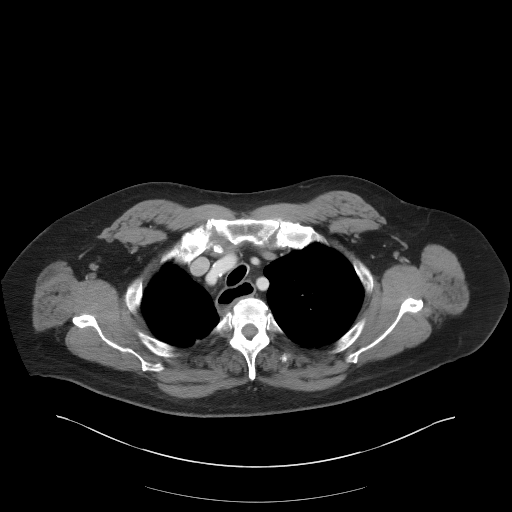

[Series 5: coronals · coronal · 0.82mm/px · 3 of 179 slices shown]
[im 60/179  soft-tissue]
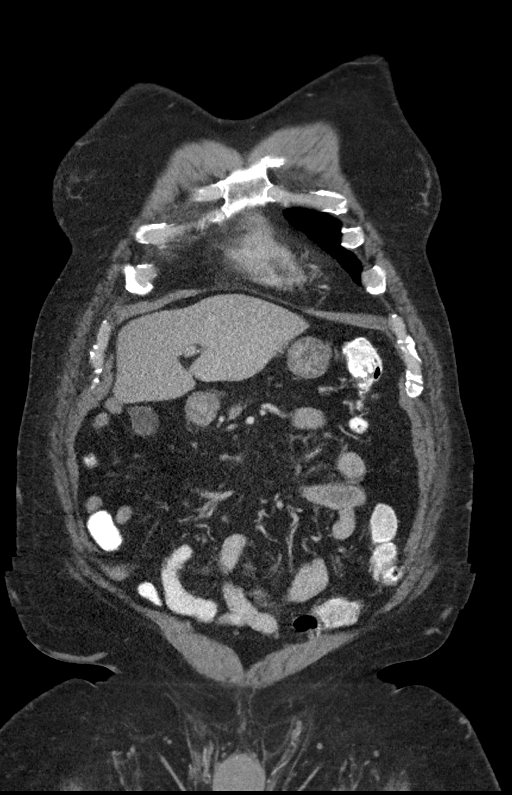
[im 80/179  soft-tissue]
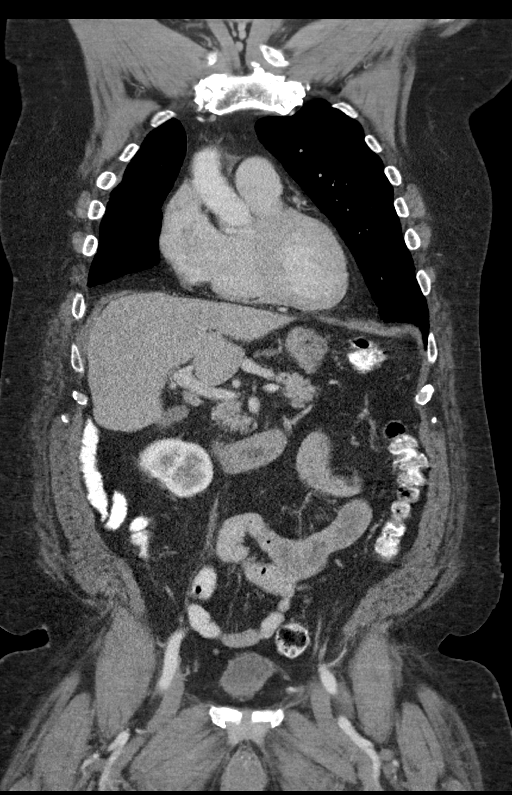
[im 99/179  soft-tissue]
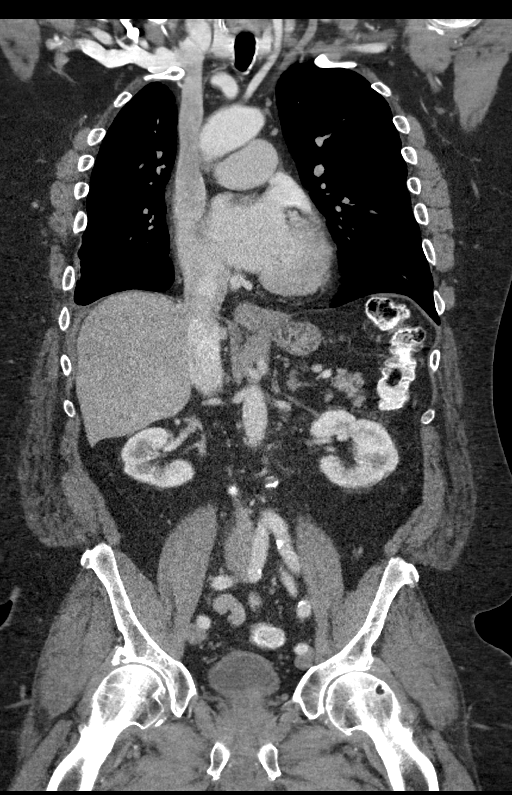

[15 of 46 positions shown; findings below may reference images not displayed]

FINDINGS: CT CHEST FINDINGS

Cardiovascular: No acute findings. Aortic atherosclerosis.

Mediastinum/Lymph Nodes: Shotty mediastinal lymph nodes remains
stable. Mild dilatation of thoracic esophagus is unchanged.

Lungs/Pleura: Stable mild right pleural thickening without effusion.
Stable scarring in right lung base. No suspicious pulmonary nodules
or masses are identified. No evidence of pulmonary infiltrate.

Musculoskeletal:  No suspicious bone lesions identified.

CT ABDOMEN AND PELVIS FINDINGS

Hepatobiliary: No masses identified. Mild hypertrophy of left and
caudate lobes remains suspicious for cirrhosis. Gallbladder is
unremarkable.

Pancreas:  No mass or inflammatory changes.

Spleen:  Within normal limits in size and appearance.

Adrenals/Urinary tract:  No masses or hydronephrosis.

Stomach/Bowel: No evidence of obstruction, inflammatory process, or
abnormal fluid collections.

Vascular/Lymphatic: No pathologically enlarged lymph nodes
identified. No abdominal aortic aneurysm.

Reproductive:  No mass or other significant abnormality identified.

Other:  None.

Musculoskeletal:  No suspicious bone lesions identified.
IMPRESSION: Stable exam. No evidence of recurrent or metastatic carcinoma. No
acute findings.

## 2019-01-28 MED ORDER — SODIUM CHLORIDE (PF) 0.9 % IJ SOLN
INTRAMUSCULAR | Status: AC
  Filled 2019-01-28: qty 50

## 2019-01-28 MED ORDER — IOHEXOL 300 MG/ML  SOLN
100.0000 mL | Freq: Once | INTRAMUSCULAR | Status: AC | PRN
  Administered 2019-01-28: 13:00:00 100 mL via INTRAVENOUS

## 2019-01-28 NOTE — Patient Instructions (Signed)
Description   Continue taking 2 tables daily. INR in 7 weeks. Call us with medication changes or concerns [phone].

## 2019-01-28 NOTE — Progress Notes (Signed)
[employer]   [phone] [phone]   Clinic Follow up Note   Patient Care Team: [name], [name] as PCP - General (Family Medicine) [name], MD as Consulting Physician (General Surgery) [name] [name], MD as Consulting Physician (Cardiology)  Date of Service:  01/31/2019  CHIEF COMPLAINT: Follow up stage III colon cancer  SUMMARY OF ONCOLOGIC HISTORY: Oncology History Overview Note  Cancer of right colon [facility])   Staging form: Colon and Rectum, AJCC 7th Edition   - Clinical stage from 04/12/2016: Stage IIIA (T2, N1, M0) - Signed by [name], MD on 05/01/2016    Cancer of right colon ([place])  03/04/2016 Procedure   COLONOSCOPY: A polypoid and ulcerated non-obstructing large mass was found in the cecum. The mass was noncircumferential. The mass measured three cm in length. In addition, its diameter measured three mm. (Dr. [name])    03/04/2016 Initial Biopsy   Diagnosis 1. Colon, biopsy, cecal - ADENOCARCINOMA. 2. Colon, polyp(s), transverse - BENIGN COLONIC MUCOSA WITH BENIGN LYMPHOID AGGREGATE. - NO ADENOMATOUS CHANGE OR MALIGNANCY IDENTIFIED. Microscopic Comment 1. The biopsies are involved by moderately to poorly differentiated colorectal type adenocarcinoma.   03/04/2016 Initial Diagnosis   Cecal cancer ([place])   03/15/2016 Imaging   1. Polypoid cecal mass. No findings of liver metastatic disease or other definite metastatic disease. There are some small adjacent pericecal lymph nodes which are not pathologically enlarged. 2. Adenopathy in the chest is present but was also present in 2010, albeit slightly less prominent. This may be reactive adenopathy related to the chronic exudative right pleural effusion.    04/07/2016 Tumor Marker   Patient's tumor was tested for the following markers: CEA. Results of the tumor marker test revealed 2.7.   04/12/2016 Definitive Surgery   Laparoscopic right hemicolectomy (Dr. [name])    04/12/2016 Pathologic Stage   pT2 pN1 pMX--Grade 3 adenocarcinoma with 2/16 nodes positive, clear margins, negative for perineural or lymphvascular invasion; invades muscularis propria Loss of expression MLH1/PMS2   06/02/2016 - 09/13/2016 Adjuvant Chemotherapy    Ajuvant chemotherapy FOLFOX, every 2 weeks for 6 cycles (3 months), last cycle postponed due to hospitalization    06/29/2016 Genetic Testing   POLE c.[id number] VUS identified on the Colorectal cancer panel.  Negative genetic testing for the MSH2 inversion analysis ([place] inversion). The Colorectal Cancer Panel offered by [facility] includes sequencing and/or duplication/deletion testing of the following 19 genes: APC, ATM, AXIN2, BMPR1A, CDH1, CHEK2, EPCAM, MLH1, MSH2, MSH6, MUTYH, PMS2, POLD1, POLE, PTEN, SCG5/GREM1, SMAD4, STK11, and TP53. The report date is 06/22/2016 for the [facility] panel and 06/29/2016 for the [facility] inversion.  POLE c.4523G>A VUS has been reclassified to a likely benign variant based on a combination of sources, e.g, internal data, published literature, population databases and in silico models. The reclassification date is June 09, 2017.    08/17/2016 - 08/20/2016 Hospital Admission   Admit date: 08/17/2016 Admission diagnosis: Acute respiratory failure with hypoxia  Additional comments: Patient was admitted initially with hypoxia and found to have H influenza positive-started on Tamiflu and broad-spectrum antibiotics were D escalate. He was hypotensive on admission given IV saline 75 cc per hour saline lock 2/16 and antihypertensives which were held including lisinopril and Lasix and hydralazine on discharge. Joint hospitalization he continued on his metoprolol twice a day   01/20/2017 Imaging   CT CAP W Contrast 01/20/17 IMPRESSION: 1. No evidence of local tumor recurrence at the ileocolic anastomosis . 2. No findings suspicious for metastatic  disease in the chest, abdomen or pelvis. 3. Mild mediastinal  lymphadenopathy is stable since 2010 and considered benign . 4. Stable morphologic changes suggestive of cirrhosis. 5. Stable patulous fluid-filled thoracic esophagus. Patchy ground-glass opacity in the posterior right upper lobe is probably inflammatory, possibly due to aspiration. 6. Stable chronic small loculated dependent right pleural effusion.    09/15/2017 Pathology Results   09/15/2017 Surgical Pathology Diagnosis Colon, polyp(s), sigmoid - TUBULAR ADENOMA. - NO HIGH GRADE DYSPLASIA OR MALIGNANCY.   01/15/2018 Imaging   01/15/2018 CT CAP W Contrast IMPRESSION: 1. No findings identified to suggest local tumor recurrence at the ileocolic anastomosis. No evidence for distant metastatic disease. 2. Stable mild mediastinal lymphadenopathy since 2010 and considered benign. 3. Similar appearance of patulous fluid-filled thoracic esophagus. 4. Unchanged right posterior pleural thickening with loculated pleural fluid. 5.  Aortic Atherosclerosis (ICD10-I70.0).   01/15/2018 Tumor Marker   CEA: 04/07/16: 2.7 04/27/2016: 2.11 07/14/2016: 4.25 10/21/2016: 3.60 01/16/17: 3.76 04/25/2017: 5.16 08/28/2017: 2.06 01/15/2018: 2.90    01/28/2019 Imaging   CT CAP W Contrast  IMPRESSION: Stable exam. No evidence of recurrent or metastatic carcinoma. No acute findings.      CURRENT THERAPY:  Surveillance  INTERVAL HISTORY:  Steven Barnett is here for a follow up colon cancer. He presents to the clinic alone. He notes he is doing well. He has no abdominal issues or pain. He is eating well. He is working on losing weight.    REVIEW OF SYSTEMS:   Constitutional: Denies fevers, chills or abnormal weight loss Eyes: Denies blurriness of vision Ears, nose, mouth, throat, and face: Denies mucositis or sore throat Respiratory: Denies cough, dyspnea or wheezes Cardiovascular: Denies palpitation, chest discomfort or lower extremity swelling Gastrointestinal:  Denies nausea, heartburn or  change in bowel habits Skin: Denies abnormal skin rashes Lymphatics: Denies new lymphadenopathy or easy bruising Neurological:Denies numbness, tingling or new weaknesses Behavioral/Psych: Mood is stable, no new changes  All other systems were reviewed with the patient and are negative.  MEDICAL HISTORY:  Past Medical History:  Diagnosis Date  . Anemia   . Arthralgia of both knees   . Arthritis    "right knee" (10/01/2015)  . Atrial fibrillation ([place])   . Cancer (HCC)    STAGE 1 COLON CANCER  . Chronic anticoagulation 2010   Coumadin  . Chronic diastolic CHF (congestive heart failure), NYHA class 2 ([place])   . Dyspnea   . Dysrhythmia   . Hypertension   . OSA (obstructive sleep apnea) 2016   "couldn't take the mask during the testing" (10/01/2015)  . Permanent atrial fibrillation 2010  . Pneumonia 3/29/2017and june 2017    SURGICAL HISTORY: Past Surgical History:  Procedure Laterality Date  . COLONOSCOPY WITH PROPOFOL N/A 03/04/2016   Procedure: COLONOSCOPY WITH PROPOFOL;  Surgeon: Carol Ada, MD;  Location: [facility];  Service: Endoscopy;  Laterality: N/A;  . COLONOSCOPY WITH PROPOFOL N/A 09/15/2017   Procedure: COLONOSCOPY WITH PROPOFOL;  Surgeon: Carol Ada, MD;  Location: [facility];  Service: Endoscopy;  Laterality: N/A;  . LAPAROSCOPIC PARTIAL COLECTOMY N/A 04/12/2016   Procedure: LAPAROSCOPIC ILEOCOLECTOMY;  Surgeon: [name], MD;  Location: [place];  Service: General;  Laterality: N/A;  . PORT-A-CATH REMOVAL N/A 02/16/2017   Procedure: REMOVAL PORT-A-CATH;  Surgeon: [name], MD;  Location: [place];  Service: General;  Laterality: N/A;  . PORTACATH PLACEMENT N/A 05/18/2016   Procedure: INSERTION PORT-A-CATH;  Surgeon: [name], MD;  Location: [place];  Service: General;  Laterality: N/A;  . THORACOTOMY Right 2010    I have reviewed the social history and family history with the patient and they are unchanged from previous note.   ALLERGIES:  has No Known Allergies.  MEDICATIONS:  Current Outpatient Medications  Medication Sig Dispense Refill  . acetaminophen (TYLENOL) 500 MG tablet Take 1,000 mg by mouth 2 (two) times daily.    Marland Kitchen albuterol (PROVENTIL HFA;VENTOLIN HFA) 108 (90 Base) MCG/ACT inhaler Inhale 2 puffs into the lungs every 6 (six) hours as needed for wheezing or shortness of breath. 1 Inhaler 2  . aspirin EC 81 MG tablet Take 81 mg by mouth daily.    Marland Kitchen diltiazem (CARDIZEM) 120 MG tablet TAKE 1 TABLET BY MOUTH ONCE DAILY 30 tablet 6  . furosemide (LASIX) 80 MG tablet Take 1 tablet by mouth twice daily 180 tablet 0  . hydrALAZINE (APRESOLINE) 50 MG tablet Take 1 tablet by mouth twice daily 180 tablet 3  . ipratropium (ATROVENT HFA) 17 MCG/ACT inhaler Inhale 2 puffs into the lungs every 6 (six) hours as needed for wheezing.     Marland Kitchen lisinopril (PRINIVIL,ZESTRIL) 20 MG tablet Take 1 tablet (20 mg total) by mouth daily. Keep appointment 90 tablet 2  . metoprolol tartrate (LOPRESSOR) 25 MG tablet Take 1 tablet by mouth twice daily 180 tablet 3  . potassium chloride SA (K-DUR,KLOR-CON) 20 MEQ tablet Take 20 mEq by mouth daily.    Marland Kitchen warfarin (COUMADIN) 4 MG tablet TAKE 2 TO 3 TABLETS BY MOUTH ONCE DAILY AS DIRECTED BY  COUMADIN  CLINIC 225 tablet 0   No current facility-administered medications for this visit.    Facility-Administered Medications Ordered in Other Visits  Medication Dose Route Frequency Provider Last Rate Last Dose  . heparin lock flush 100 unit/mL  500 Units Intravenous Once PRN [name], MD      . sodium chloride flush (NS) 0.9 % injection 10 mL  10 mL Intravenous PRN [name], MD        PHYSICAL EXAMINATION: ECOG PERFORMANCE STATUS: 0 - Asymptomatic  Vitals:   01/31/19 1113  BP: 128/87  Pulse: 60  Resp: 18  Temp: 98.5 F (36.9 C)  SpO2: 97%   Filed Weights   01/31/19 1113  Weight: 281 lb 1.6 oz (127.5 kg)    GENERAL:alert, no distress and comfortable SKIN: skin color, texture,  turgor are normal, no rashes or significant lesions EYES: normal, Conjunctiva are pink and non-injected, sclera clear  NECK: supple, thyroid normal size, non-tender, without nodularity LYMPH:  no palpable lymphadenopathy in the cervical, axillary  LUNGS: clear to auscultation and percussion with normal breathing effort HEART: regular rate & rhythm and no murmurs and no lower extremity edema ABDOMEN:abdomen soft, non-tender and normal bowel sounds Musculoskeletal:no cyanosis of digits and no clubbing  NEURO: alert & oriented x 3 with fluent speech, no focal motor/sensory deficits  LABORATORY DATA:  I have reviewed the data as listed CBC Latest Ref Rng & Units 01/28/2019 08/01/2018 01/15/2018  WBC 4.0 - 10.5 K/uL 6.4 10.1 9.4  Hemoglobin 13.0 - 17.0 g/dL 16.4 15.7 16.2  Hematocrit 39.0 - 52.0 % 51.8 49.8 50.4(H)  Platelets 150 - 400 K/uL 171 169 172     CMP Latest Ref Rng & Units 01/28/2019 08/01/2018 01/15/2018  Glucose 70 - 99 mg/dL 86 90 80  BUN 8 - 23 mg/dL 15 18 11   Creatinine 0.61 - 1.24 mg/dL 1.01 0.88 0.96  Sodium 135 - 145 mmol/L 137 140 134(L)  Potassium  3.5 - 5.1 mmol/L 4.1 4.4 4.5  Chloride 98 - 111 mmol/L 101 102 96(L)  CO2 22 - 32 mmol/L 29 30 30   Calcium 8.9 - 10.3 mg/dL 9.1 9.3 9.5  Total Protein 6.5 - 8.1 g/dL 7.8 7.5 8.1  Total Bilirubin 0.3 - 1.2 mg/dL 1.0 0.7 1.4(H)  Alkaline Phos 38 - 126 U/L 119 178(H) 120  AST 15 - 41 U/L 20 21 25   ALT 0 - 44 U/L 17 20 23       RADIOGRAPHIC STUDIES: I have personally reviewed the radiological images as listed and agreed with the findings in the report. No results found.   ASSESSMENT & PLAN:  AIDON KLEMENS is a 68 y.o. male with   1. Right colon cancer, invasive adenocarcinoma,G3, pT2N1M0, stage IIIA, MSI-H -He was diagnosed in 03/2016. He is s/p right hemicolectomy and adjuvant chemo FOLFOX -His last colonoscopy was 09/2017, he had 1 small benign polyp. Overall benign exam. Will repeat in 3 years, 2022.  -We reviewed and  discussed his CT CAP from 01/28/19 which shows stable with no evidence of recurrent or metastatic disease.  -He is clinically doing well. Labs reviewed, CBC and CMP WNL, iron panel normal. He is fine to stop oral iron. There is no clinical concern for recurrance -He is almost 3 years since his diagnosis, His risk of recurrance has decreased significantly. Will continue to follow him for 5 years total. I do not plan to repeat surveillance scan again.  -F/u in 6 months    2. HTN, OSA, CHF, obesity -He will continue follow-up with his primary care physician -He is on Cardizem, hydralazine, lisinopril and metoprolol. -He is on Coumadin, managed by PCP. He does have bruising of his skin with no other sings of bleeding. I encouraged him to avoid injury and bumping into things.  -I discussed his obesity can effect his heart function. I encouraged him to lose weight with change in diet and exercising.  -BP at 128/87 today    Plan -He is clinically doing well, continue surveillance -He is fine to stop oral iron  -F/u in 6 months with lab     No problem-specific Assessment & Plan notes found for this encounter.   No orders of the defined types were placed in this encounter.  All questions were answered. The patient knows to call the clinic with any problems, questions or concerns. No barriers to learning was detected. I spent 15 minutes counseling the patient face to face. The total time spent in the appointment was 25 minutes and more than 50% was on counseling and review of test results     [name], MD 01/31/2019   I, Joslyn Devon, am acting as scribe for [name], MD.   I have reviewed the above documentation for accuracy and completeness, and I agree with the above.

## 2019-01-31 ENCOUNTER — Inpatient Hospital Stay (HOSPITAL_BASED_OUTPATIENT_CLINIC_OR_DEPARTMENT_OTHER): Payer: Medicare HMO | Admitting: Hematology

## 2019-01-31 ENCOUNTER — Other Ambulatory Visit: Payer: Self-pay

## 2019-01-31 ENCOUNTER — Telehealth: Payer: Self-pay | Admitting: Hematology

## 2019-01-31 VITALS — BP 128/87 | HR 60 | Temp 98.5°F | Resp 18 | Ht 70.0 in | Wt 281.1 lb

## 2019-01-31 DIAGNOSIS — I110 Hypertensive heart disease with heart failure: Secondary | ICD-10-CM | POA: Diagnosis not present

## 2019-01-31 DIAGNOSIS — C182 Malignant neoplasm of ascending colon: Secondary | ICD-10-CM | POA: Diagnosis not present

## 2019-01-31 DIAGNOSIS — I509 Heart failure, unspecified: Secondary | ICD-10-CM | POA: Diagnosis not present

## 2019-01-31 DIAGNOSIS — E669 Obesity, unspecified: Secondary | ICD-10-CM | POA: Diagnosis not present

## 2019-01-31 NOTE — Telephone Encounter (Signed)
Scheduled appt per 7/30 los. ° °Printed and mailed appt calendar. °

## 2019-02-02 ENCOUNTER — Encounter: Payer: Self-pay | Admitting: Hematology

## 2019-02-09 ENCOUNTER — Other Ambulatory Visit: Payer: Self-pay | Admitting: Internal Medicine

## 2019-02-11 ENCOUNTER — Other Ambulatory Visit: Payer: Self-pay | Admitting: *Deleted

## 2019-02-11 MED ORDER — POTASSIUM CHLORIDE CRYS ER 20 MEQ PO TBCR: 20.0000 meq | EXTENDED_RELEASE_TABLET | Freq: Every day | ORAL | 0 refills | Status: DC

## 2019-03-18 ENCOUNTER — Other Ambulatory Visit: Payer: Self-pay

## 2019-03-18 ENCOUNTER — Ambulatory Visit (INDEPENDENT_AMBULATORY_CARE_PROVIDER_SITE_OTHER): Payer: Medicare HMO | Admitting: Pharmacist

## 2019-03-18 DIAGNOSIS — Z5181 Encounter for therapeutic drug level monitoring: Secondary | ICD-10-CM | POA: Diagnosis not present

## 2019-03-18 DIAGNOSIS — I4821 Permanent atrial fibrillation: Secondary | ICD-10-CM

## 2019-03-18 LAB — POCT INR: INR: 2.5 (ref 2.0–3.0)

## 2019-03-29 ENCOUNTER — Other Ambulatory Visit: Payer: Self-pay | Admitting: Internal Medicine

## 2019-04-19 ENCOUNTER — Other Ambulatory Visit: Payer: Self-pay | Admitting: Cardiovascular Disease

## 2019-05-06 ENCOUNTER — Ambulatory Visit (INDEPENDENT_AMBULATORY_CARE_PROVIDER_SITE_OTHER): Payer: Medicare HMO | Admitting: Pharmacist Clinician (PhC)/ Clinical Pharmacy Specialist

## 2019-05-06 ENCOUNTER — Other Ambulatory Visit: Payer: Self-pay

## 2019-05-06 DIAGNOSIS — Z5181 Encounter for therapeutic drug level monitoring: Secondary | ICD-10-CM | POA: Diagnosis not present

## 2019-05-06 DIAGNOSIS — I4821 Permanent atrial fibrillation: Secondary | ICD-10-CM

## 2019-05-06 LAB — POCT INR: INR: 3.7 — AB (ref 2.0–3.0)

## 2019-05-27 ENCOUNTER — Ambulatory Visit (INDEPENDENT_AMBULATORY_CARE_PROVIDER_SITE_OTHER): Payer: Medicare HMO | Admitting: Pharmacist Clinician (PhC)/ Clinical Pharmacy Specialist

## 2019-05-27 ENCOUNTER — Other Ambulatory Visit: Payer: Self-pay

## 2019-05-27 DIAGNOSIS — Z5181 Encounter for therapeutic drug level monitoring: Secondary | ICD-10-CM | POA: Diagnosis not present

## 2019-05-27 DIAGNOSIS — I4821 Permanent atrial fibrillation: Secondary | ICD-10-CM | POA: Diagnosis not present

## 2019-05-27 LAB — POCT INR: INR: 2.8 (ref 2.0–3.0)

## 2019-05-27 NOTE — Patient Instructions (Signed)
BP elevated today at 162/102.   Increase the hydralazine to 2 tablets (100 mg) twice daily.

## 2019-06-24 ENCOUNTER — Ambulatory Visit: Payer: Medicare HMO

## 2019-06-24 NOTE — Progress Notes (Deleted)
06/24/2019 Steven Barnett [birth year] [id number]   HPI:  Steven Barnett is a 68 y.o. male patient of Dr [name], with a PMH below who presents today for hypertension clinic evaluation.  Steven Barnett was in the office last month for a regular INR check and he commented that his pressure had been running high.  A quick check in the office showed him to be at 162/102.  He has not seen Dr. [name] since 2019.  We scheduled him to come into the hypertension clinic today and will make arrangements for him to see Dr. [name] early in 2021.    Past Medical History: CHF Last Echo 2017 with EF 55-60%;   Atrial fibrillation On diltiazem, metoprolol and warfarin  obesity BMI 40.3     Blood Pressure Goal:  130/80  Current Medications: diltiazem 120 mg qd, hydralazine 100 mg bid, lisinopril 20 mg qd, metoprolol 25 mg bid  Family Hx:  Social Hx:  Diet:  Exercise:  Home BP readings:  Intolerances:   Labs:  Wt Readings from Last 3 Encounters:  01/31/19 281 lb 1.6 oz (127.5 kg)  08/01/18 284 lb (128.8 kg)  06/06/18 270 lb (122.5 kg)   BP Readings from Last 3 Encounters:  05/27/19 (!) 162/102  01/31/19 128/87  08/01/18 (!) 125/106   Pulse Readings from Last 3 Encounters:  01/31/19 60  08/01/18 61  04/24/18 61    Current Outpatient Medications  Medication Sig Dispense Refill  . acetaminophen (TYLENOL) 500 MG tablet Take 1,000 mg by mouth 2 (two) times daily.    Marland Kitchen albuterol (PROVENTIL HFA;VENTOLIN HFA) 108 (90 Base) MCG/ACT inhaler Inhale 2 puffs into the lungs every 6 (six) hours as needed for wheezing or shortness of breath. 1 Inhaler 2  . aspirin EC 81 MG tablet Take 81 mg by mouth daily.    Marland Kitchen diltiazem (CARDIZEM) 120 MG tablet TAKE 1 TABLET BY MOUTH ONCE DAILY 30 tablet 6  . furosemide (LASIX) 80 MG tablet Take 1 tablet (80 mg total) by mouth 2 (two) times daily. Pt needs to call and make appt by October for further refills 180 tablet 0  . hydrALAZINE (APRESOLINE) 50 MG tablet Take  1 tablet by mouth twice daily 180 tablet 3  . ipratropium (ATROVENT HFA) 17 MCG/ACT inhaler Inhale 2 puffs into the lungs every 6 (six) hours as needed for wheezing.     Marland Kitchen lisinopril (PRINIVIL,ZESTRIL) 20 MG tablet Take 1 tablet (20 mg total) by mouth daily. Keep appointment 90 tablet 2  . metoprolol tartrate (LOPRESSOR) 25 MG tablet Take 1 tablet by mouth twice daily 180 tablet 3  . potassium chloride SA (K-DUR) 20 MEQ tablet Take 1 tablet (20 mEq total) by mouth daily. NEED OV. 180 tablet 0  . warfarin (COUMADIN) 4 MG tablet TAKE 2 TO 3 TABLETS BY MOUTH ONCE DAILY AS DIRECTED BY  COUMADIN  CLINIC 270 tablet 0   No current facility-administered medications for this visit.   Facility-Administered Medications Ordered in Other Visits  Medication Dose Route Frequency Provider Last Rate Last Admin  . heparin lock flush 100 unit/mL  500 Units Intravenous Once PRN [name], MD      . sodium chloride flush (NS) 0.9 % injection 10 mL  10 mL Intravenous PRN [name], MD        No Known Allergies  Past Medical History:  Diagnosis Date  . Anemia   . Arthralgia of both knees   . Arthritis    "  right knee" (10/01/2015)  . Atrial fibrillation ([place])   . Cancer (HCC)    STAGE 1 COLON CANCER  . Chronic anticoagulation 2010   Coumadin  . Chronic diastolic CHF (congestive heart failure), NYHA class 2 ([place])   . Dyspnea   . Dysrhythmia   . Hypertension   . OSA (obstructive sleep apnea) 2016   "couldn't take the mask during the testing" (10/01/2015)  . Permanent atrial fibrillation ([place]) 2010  . Pneumonia 3/29/2017and june 2017    There were no vitals taken for this visit.  No problem-specific Assessment & Plan notes found for this encounter.   Tommy Medal PharmD CPP [facility] [address] [place], [place] [address] [phone]

## 2019-06-25 ENCOUNTER — Ambulatory Visit (INDEPENDENT_AMBULATORY_CARE_PROVIDER_SITE_OTHER): Payer: Medicare HMO | Admitting: Pharmacist Clinician (PhC)/ Clinical Pharmacy Specialist

## 2019-06-25 ENCOUNTER — Other Ambulatory Visit: Payer: Self-pay

## 2019-06-25 DIAGNOSIS — I4821 Permanent atrial fibrillation: Secondary | ICD-10-CM | POA: Diagnosis not present

## 2019-06-25 DIAGNOSIS — I10 Essential (primary) hypertension: Secondary | ICD-10-CM

## 2019-06-25 DIAGNOSIS — Z5181 Encounter for therapeutic drug level monitoring: Secondary | ICD-10-CM

## 2019-06-25 LAB — POCT INR: INR: 3.2 — AB (ref 2.0–3.0)

## 2019-06-25 MED ORDER — HYDRALAZINE HCL 100 MG PO TABS: 100.0000 mg | ORAL_TABLET | Freq: Two times a day (BID) | ORAL | 3 refills | Status: DC

## 2019-06-25 NOTE — Patient Instructions (Signed)
  Your blood pressure today is 142/90  Check your blood pressure at home daily (if able) and keep record of the readings.  Take your BP meds as follows:  Continue with the increased dose of hydralazine - 100 mg twice daily.  A new prescription for the 100 mg tablets has been sent to [facility].  In the meantime, take 2 of the 50 mg tablets twice daily.  Continue with all other medications.  Bring all of your meds, your BP cuff and your record of home blood pressures to your next appointment.  Exercise as you're able, try to walk approximately 30 minutes per day.  Keep salt intake to a minimum, especially watch canned and prepared boxed foods.  Eat more fresh fruits and vegetables and fewer canned items.  Avoid eating in fast food restaurants.    HOW TO TAKE YOUR BLOOD PRESSURE: . Rest 5 minutes before taking your blood pressure. .  Don't smoke or drink caffeinated beverages for at least 30 minutes before. . Take your blood pressure before (not after) you eat. . Sit comfortably with your back supported and both feet on the floor (don't cross your legs). . Elevate your arm to heart level on a table or a desk. . Use the proper sized cuff. It should fit smoothly and snugly around your bare upper arm. There should be enough room to slip a fingertip under the cuff. The bottom edge of the cuff should be 1 inch above the crease of the elbow. . Ideally, take 3 measurements at one sitting and record the average.

## 2019-06-25 NOTE — Progress Notes (Signed)
06/26/2019 Steven Barnett 08-Apr-1951 [id number]   HPI:  Steven Barnett is a 68 y.o. male patient of Dr [name], with a PMH below who presents today for hypertension clinic evaluation.  Patient was seen in Coumadin Clinic last month and commented on problems controlling his BP.  It was checked that day and found to be 162/102.  We asked him to increase his hydralazine to 100 mg twice daily and scheduled him for a full hypertension evaluation.  See pertinent medical history below.   Patient has not seen Dr. [name] since 2017, saw Kerin Ransom in February 2019.  Will schedule him to see a provider before he leaves today.   Today he comes in and notes that he increased the hydralazine for most of the past month, but then went back to 50 mg bid in the past week or two.  He has no complaints of chest pain, shortness of breath or dizziness.    Past Medical History: CHF  HFpEF  Atrial fibrillation On warfarin  obesity BMI 40     Blood Pressure Goal:  130/80  Current Medications: diltiazem 120 mg every day, hydralazine 100 mg bid, lisinopril 20 mg every day, metoprolol tartrate 25 mg bid,   Social Hx: no tobacco, only rare beer, drinks some colas  Diet: stopped salting foods since last visit; mostly home cooked; doesn't like meats as much; prefers veggies (carrots, cabbage, green beans  - no canned), salads; not a snacker  Exercise: no regular exercise, does have to climb stairs to get to bedroom  Home BP readings: no home cuff  Intolerances: nkda  Labs: 01/2019:  Na 137, K 4.1, Glu 86, BUN 15, SCr 1.01  Wt Readings from Last 3 Encounters:  06/25/19 286 lb 6.4 oz (129.9 kg)  01/31/19 281 lb 1.6 oz (127.5 kg)  08/01/18 284 lb (128.8 kg)   BP Readings from Last 3 Encounters:  06/25/19 (!) 142/90  05/27/19 (!) 162/102  01/31/19 128/87   Pulse Readings from Last 3 Encounters:  06/25/19 68  01/31/19 60  08/01/18 61    Current Outpatient Medications  Medication Sig Dispense  Refill  . acetaminophen (TYLENOL) 500 MG tablet Take 1,000 mg by mouth 2 (two) times daily.    Marland Kitchen albuterol (PROVENTIL HFA;VENTOLIN HFA) 108 (90 Base) MCG/ACT inhaler Inhale 2 puffs into the lungs every 6 (six) hours as needed for wheezing or shortness of breath. 1 Inhaler 2  . aspirin EC 81 MG tablet Take 81 mg by mouth daily.    Marland Kitchen diltiazem (CARDIZEM) 120 MG tablet TAKE 1 TABLET BY MOUTH ONCE DAILY 30 tablet 6  . furosemide (LASIX) 80 MG tablet Take 1 tablet (80 mg total) by mouth 2 (two) times daily. Pt needs to call and make appt by October for further refills 180 tablet 0  . ipratropium (ATROVENT HFA) 17 MCG/ACT inhaler Inhale 2 puffs into the lungs every 6 (six) hours as needed for wheezing.     Marland Kitchen lisinopril (PRINIVIL,ZESTRIL) 20 MG tablet Take 1 tablet (20 mg total) by mouth daily. Keep appointment 90 tablet 2  . metoprolol tartrate (LOPRESSOR) 25 MG tablet Take 1 tablet by mouth twice daily 180 tablet 3  . potassium chloride SA (K-DUR) 20 MEQ tablet Take 1 tablet (20 mEq total) by mouth daily. NEED OV. 180 tablet 0  . warfarin (COUMADIN) 4 MG tablet TAKE 2 TO 3 TABLETS BY MOUTH ONCE DAILY AS DIRECTED BY  COUMADIN  CLINIC 270 tablet 0  .  hydrALAZINE (APRESOLINE) 100 MG tablet Take 1 tablet (100 mg total) by mouth 2 (two) times daily. 180 tablet 3   No current facility-administered medications for this visit.   Facility-Administered Medications Ordered in Other Visits  Medication Dose Route Frequency Provider Last Rate Last Admin  . heparin lock flush 100 unit/mL  500 Units Intravenous Once PRN [name], MD      . sodium chloride flush (NS) 0.9 % injection 10 mL  10 mL Intravenous PRN [name], MD        No Known Allergies  Past Medical History:  Diagnosis Date  . Anemia   . Arthralgia of both knees   . Arthritis    "right knee" (10/01/2015)  . Atrial fibrillation ([place])   . Cancer (HCC)    STAGE 1 COLON CANCER  . Chronic anticoagulation 2010   Coumadin  . Chronic diastolic CHF  (congestive heart failure), NYHA class 2 ([place])   . Dyspnea   . Dysrhythmia   . Hypertension   . OSA (obstructive sleep apnea) 2016   "couldn't take the mask during the testing" (10/01/2015)  . Permanent atrial fibrillation ([place]) 2010  . Pneumonia 3/29/2017and june 2017    Blood pressure (!) 142/90, pulse 68, resp. rate 15, height 5\' 10"  (1.778 m), weight 286 lb 6.4 oz (129.9 kg), SpO2 94 %.  Essential hypertension, benign Patient blood pressure down about 20/10 points today despite cutting back to original dose of hydralazine.  Will have him resume the 100 mg bid dosing and sent a new prescription to his pharmacy.  We can monitor his BP at his next INR check and determine need for follow up at that time.     Tommy Medal PharmD CPP [facility] [address] [place], [place] [address] [phone]

## 2019-06-26 NOTE — Assessment & Plan Note (Signed)
Patient blood pressure down about 20/10 points today despite cutting back to original dose of hydralazine.  Will have him resume the 100 mg bid dosing and sent a new prescription to his pharmacy.  We can monitor his BP at his next INR check and determine need for follow up at that time.

## 2019-07-07 ENCOUNTER — Other Ambulatory Visit: Payer: Self-pay | Admitting: Internal Medicine

## 2019-07-20 ENCOUNTER — Other Ambulatory Visit: Payer: Self-pay | Admitting: Internal Medicine

## 2019-07-29 NOTE — Progress Notes (Signed)
[name]   [phone] [phone]   Clinic Follow up Note   Patient Care Team: [name], [name] as PCP - General (Family Medicine) [name], MD as Consulting Physician (General Surgery) [name] [name], MD as Consulting Physician (Cardiology)  Date of Service:  08/02/2019  CHIEF COMPLAINT: Follow up stage III colon cancer  SUMMARY OF ONCOLOGIC HISTORY: Oncology History Overview Note  Cancer of right colon [facility])   Staging form: Colon and Rectum, AJCC 7th Edition   - Clinical stage from 04/12/2016: Stage IIIA (T2, N1, M0) - Signed by [name], MD on 05/01/2016    Cancer of right colon ([place])  03/04/2016 Procedure   COLONOSCOPY: A polypoid and ulcerated non-obstructing large mass was found in the cecum. The mass was noncircumferential. The mass measured three cm in length. In addition, its diameter measured three mm. (Dr. [name])    03/04/2016 Initial Biopsy   Diagnosis 1. Colon, biopsy, cecal - ADENOCARCINOMA. 2. Colon, polyp(s), transverse - BENIGN COLONIC MUCOSA WITH BENIGN LYMPHOID AGGREGATE. - NO ADENOMATOUS CHANGE OR MALIGNANCY IDENTIFIED. Microscopic Comment 1. The biopsies are involved by moderately to poorly differentiated colorectal type adenocarcinoma.   03/04/2016 Initial Diagnosis   Cecal cancer ([name])   03/15/2016 Imaging   1. Polypoid cecal mass. No findings of liver metastatic disease or other definite metastatic disease. There are some small adjacent pericecal lymph nodes which are not pathologically enlarged. 2. Adenopathy in the chest is present but was also present in 2010, albeit slightly less prominent. This may be reactive adenopathy related to the chronic exudative right pleural effusion.    04/07/2016 Tumor Marker   Patient's tumor was tested for the following markers: CEA. Results of the tumor marker test revealed 2.7.   04/12/2016 Definitive Surgery   Laparoscopic right hemicolectomy (Dr.  [name])   04/12/2016 Pathologic Stage   pT2 pN1 pMX--Grade 3 adenocarcinoma with 2/16 nodes positive, clear margins, negative for perineural or lymphvascular invasion; invades muscularis propria Loss of expression MLH1/PMS2   06/02/2016 - 09/13/2016 Adjuvant Chemotherapy    Ajuvant chemotherapy FOLFOX, every 2 weeks for 6 cycles (3 months), last cycle postponed due to hospitalization    06/29/2016 Genetic Testing   POLE c.4523G>A VUS identified on the Colorectal cancer panel.  Negative genetic testing for the MSH2 inversion analysis ([place] inversion). The Colorectal Cancer Panel offered by [facility] includes sequencing and/or duplication/deletion testing of the following 19 genes: APC, ATM, AXIN2, BMPR1A, CDH1, CHEK2, EPCAM, MLH1, MSH2, MSH6, MUTYH, PMS2, POLD1, POLE, PTEN, SCG5/GREM1, SMAD4, STK11, and TP53. The report date is 06/22/2016 for the [facility] panel and 06/29/2016 for the [place] inversion.  POLE c.4523G>A VUS has been reclassified to a likely benign variant based on a combination of sources, e.g, internal data, published literature, population databases and in silico models. The reclassification date is June 09, 2017.    08/17/2016 - 08/20/2016 Hospital Admission   Admit date: 08/17/2016 Admission diagnosis: Acute respiratory failure with hypoxia  Additional comments: Patient was admitted initially with hypoxia and found to have H influenza positive-started on Tamiflu and broad-spectrum antibiotics were D escalate. He was hypotensive on admission given IV saline 75 cc per hour saline lock 2/16 and antihypertensives which were held including lisinopril and Lasix and hydralazine on discharge. Joint hospitalization he continued on his metoprolol twice a day   01/20/2017 Imaging   CT CAP W Contrast 01/20/17 IMPRESSION: 1. No evidence of local tumor recurrence at the ileocolic anastomosis . 2. No findings suspicious for metastatic  disease in the chest, abdomen or pelvis. 3. Mild mediastinal  lymphadenopathy is stable since 2010 and considered benign . 4. Stable morphologic changes suggestive of cirrhosis. 5. Stable patulous fluid-filled thoracic esophagus. Patchy ground-glass opacity in the posterior right upper lobe is probably inflammatory, possibly due to aspiration. 6. Stable chronic small loculated dependent right pleural effusion.    09/15/2017 Pathology Results   09/15/2017 Surgical Pathology Diagnosis Colon, polyp(s), sigmoid - TUBULAR ADENOMA. - NO HIGH GRADE DYSPLASIA OR MALIGNANCY.   01/15/2018 Imaging   01/15/2018 CT CAP W Contrast IMPRESSION: 1. No findings identified to suggest local tumor recurrence at the ileocolic anastomosis. No evidence for distant metastatic disease. 2. Stable mild mediastinal lymphadenopathy since 2010 and considered benign. 3. Similar appearance of patulous fluid-filled thoracic esophagus. 4. Unchanged right posterior pleural thickening with loculated pleural fluid. 5.  Aortic Atherosclerosis (ICD10-I70.0).   01/15/2018 Tumor Marker   CEA: 04/07/16: 2.7 04/27/2016: 2.11 07/14/2016: 4.25 10/21/2016: 3.60 01/16/17: 3.76 04/25/2017: 5.16 08/28/2017: 2.06 01/15/2018: 2.90    01/28/2019 Imaging   CT CAP W Contrast  IMPRESSION: Stable exam. No evidence of recurrent or metastatic carcinoma. No acute findings.      CURRENT THERAPY:  Surveillance  INTERVAL HISTORY:  Steven Barnett is here for a follow up of colon cancer. He was last seen by em 6 months ago. He presents to the clinic alone. He notes he is doing fairly. He notes he had gout flare of his left hand. He notes he is eating more and has gained weight. He denies abdominal issues or BM issues.  He notes he is SOB mildly. He feels this is related to his weight.     REVIEW OF SYSTEMS:   Constitutional: Denies fevers, chills or abnormal weight loss Eyes: Denies blurriness of vision Ears, nose, mouth, throat, and face: Denies mucositis or sore throat Respiratory: Denies  cough or wheezes (+) Mild SOB Cardiovascular: Denies palpitation, chest discomfort or lower extremity swelling Gastrointestinal:  Denies nausea, heartburn or change in bowel habits Skin: Denies abnormal skin rashes MSK: (+) Gout flare of left hand  Lymphatics: Denies new lymphadenopathy or easy bruising Neurological:Denies numbness, tingling or new weaknesses Behavioral/Psych: Mood is stable, no new changes  All other systems were reviewed with the patient and are negative.  MEDICAL HISTORY:  Past Medical History:  Diagnosis Date  . Anemia   . Arthralgia of both knees   . Arthritis    "right knee" (10/01/2015)  . Atrial fibrillation ([place])   . Cancer (HCC)    STAGE 1 COLON CANCER  . Chronic anticoagulation 2010   Coumadin  . Chronic diastolic CHF (congestive heart failure), NYHA class 2 ([place])   . Dyspnea   . Dysrhythmia   . Hypertension   . OSA (obstructive sleep apnea) 2016   "couldn't take the mask during the testing" (10/01/2015)  . Permanent atrial fibrillation ([place]) 2010  . Pneumonia 3/29/2017and june 2017    SURGICAL HISTORY: Past Surgical History:  Procedure Laterality Date  . COLONOSCOPY WITH PROPOFOL N/A 03/04/2016   Procedure: COLONOSCOPY WITH PROPOFOL;  Surgeon: Carol Ada, MD;  Location: [facility];  Service: Endoscopy;  Laterality: N/A;  . COLONOSCOPY WITH PROPOFOL N/A 09/15/2017   Procedure: COLONOSCOPY WITH PROPOFOL;  Surgeon: Carol Ada, MD;  Location[facility];  Service: Endoscopy;  Laterality: N/A;  . LAPAROSCOPIC PARTIAL COLECTOMY N/A 04/12/2016   Procedure: LAPAROSCOPIC ILEOCOLECTOMY;  Surgeon: [name], MD;  Location: [place];  Service: General;  Laterality: N/A;  . PORT-A-CATH REMOVAL N/A  02/16/2017   Procedure: REMOVAL PORT-A-CATH;  Surgeon: [name], MD;  Location: [place];  Service: General;  Laterality: N/A;  . PORTACATH PLACEMENT N/A 05/18/2016   Procedure: INSERTION PORT-A-CATH;  Surgeon: [name], MD;  Location: [place];  Service: General;  Laterality: N/A;  . THORACOTOMY Right 2010    I have reviewed the social history and family history with the patient and they are unchanged from previous note.  ALLERGIES:  has No Known Allergies.  MEDICATIONS:  Current Outpatient Medications  Medication Sig Dispense Refill  . acetaminophen (TYLENOL) 500 MG tablet Take 1,000 mg by mouth 2 (two) times daily.    Marland Kitchen albuterol (PROVENTIL HFA;VENTOLIN HFA) 108 (90 Base) MCG/ACT inhaler Inhale 2 puffs into the lungs every 6 (six) hours as needed for wheezing or shortness of breath. 1 Inhaler 2  . aspirin EC 81 MG tablet Take 81 mg by mouth daily.    . colchicine 0.6 MG tablet Take 0.6 mg by mouth 2 (two) times daily.    Marland Kitchen diltiazem (CARDIZEM) 120 MG tablet TAKE 1 TABLET BY MOUTH ONCE DAILY 30 tablet 6  . furosemide (LASIX) 80 MG tablet TAKE 1 TABLET BY MOUTH TWICE DAILY . APPOINTMENT REQUIRED FOR FUTURE REFILLS 180 tablet 0  . hydrALAZINE (APRESOLINE) 100 MG tablet Take 1 tablet (100 mg total) by mouth 2 (two) times daily. 180 tablet 3  . ipratropium (ATROVENT HFA) 17 MCG/ACT inhaler Inhale 2 puffs into the lungs every 6 (six) hours as needed for wheezing.     Marland Kitchen lisinopril (ZESTRIL) 20 MG tablet Take 1 tablet (20 mg total) by mouth daily. 90 tablet 2  . metoprolol tartrate (LOPRESSOR) 25 MG tablet Take 1 tablet by mouth twice daily 180 tablet 3  . potassium chloride SA (K-DUR) 20 MEQ tablet Take 1 tablet (20 mEq total) by mouth daily. NEED OV. 180 tablet 0  . warfarin (COUMADIN) 4 MG tablet TAKE 2 TO 3 TABLETS BY MOUTH ONCE DAILY AS DIRECTED BY  COUMADIN  CLINIC 270 tablet 0   No current facility-administered medications for this visit.   Facility-Administered Medications Ordered in Other Visits  Medication Dose Route Frequency Provider Last Rate Last Admin  . heparin lock flush 100 unit/mL  500 Units Intravenous Once PRN [name], MD      . sodium chloride flush (NS) 0.9 % injection 10 mL  10 mL Intravenous PRN  [name], MD        PHYSICAL EXAMINATION: ECOG PERFORMANCE STATUS: 0 - Asymptomatic  Vitals:   08/02/19 1200  BP: (!) 159/92  Pulse: 72  Resp: 20  Temp: 98.2 F (36.8 C)  SpO2: 95%   Filed Weights   08/02/19 1200  Weight: 288 lb (130.6 kg)    GENERAL:alert, no distress and comfortable SKIN: skin color, texture, turgor are normal, no rashes or significant lesions EYES: normal, Conjunctiva are pink and non-injected, sclera clear  NECK: supple, thyroid normal size, non-tender, without nodularity LYMPH:  no palpable lymphadenopathy in the cervical, axillary  LUNGS: clear to auscultation and percussion with normal breathing effort HEART: regular rate & rhythm and no murmurs and no lower extremity edema ABDOMEN:abdomen soft, non-tender and normal bowel sounds Musculoskeletal:no cyanosis of digits and no clubbing  NEURO: alert & oriented x 3 with fluent speech, no focal motor/sensory deficits  LABORATORY DATA:  I have reviewed the data as listed CBC Latest Ref Rng & Units 08/02/2019 01/28/2019 08/01/2018  WBC 4.0 - 10.5 K/uL 5.3 6.4 10.1  Hemoglobin 13.0 - 17.0 g/dL 16.0 16.4 15.7  Hematocrit 39.0 - 52.0 % 51.2 51.8 49.8  Platelets 150 - 400 K/uL 180 171 169     CMP Latest Ref Rng & Units 08/02/2019 01/28/2019 08/01/2018  Glucose 70 - 99 mg/dL 107(H) 86 90  BUN 8 - 23 mg/dL 17 15 18   Creatinine 0.61 - 1.24 mg/dL 1.00 1.01 0.88  Sodium 135 - 145 mmol/L 139 137 140  Potassium 3.5 - 5.1 mmol/L 3.8 4.1 4.4  Chloride 98 - 111 mmol/L 102 101 102  CO2 22 - 32 mmol/L 31 29 30   Calcium 8.9 - 10.3 mg/dL 8.9 9.1 9.3  Total Protein 6.5 - 8.1 g/dL 7.6 7.8 7.5  Total Bilirubin 0.3 - 1.2 mg/dL 0.9 1.0 0.7  Alkaline Phos 38 - 126 U/L 133(H) 119 178(H)  AST 15 - 41 U/L 17 20 21   ALT 0 - 44 U/L 13 17 20       RADIOGRAPHIC STUDIES: I have personally reviewed the radiological images as listed and agreed with the findings in the report. No results found.   ASSESSMENT & PLAN:  BETHANY CUMMING is a 69 y.o. male with    1. Right colon cancer, invasive adenocarcinoma,G3, pT2N1M0, stage IIIA, MSI-H -He was diagnosed in 03/2016. He is s/p right hemicolectomy and adjuvant chemo FOLFOX -His last colonoscopy was 09/2017, he had 1 small benign polyp. Overall benign exam. Will repeat in 3 years, 2022.  -He is clinically doing well. He does continue to gain weight which can impact his breathing. Labs reviewed, CBC and CMP WNL except Alk Phos 133. Iron panel still pending. Physical exam unremarkable. There is no concern for recurrence.  -He is over 3 years from diagnosis. His risk of recurrence has significantly reduced. Do not plan to repeat routine scan unless he develops concerning symptoms. Continue 5 year surveillance.  -Lab and F/u 8 months    2. HTN, OSA, CHF, obesity -He will continue follow-up with his primary care physician -He is on Cardizem, hydralazine, lisinopril and metoprolol. -He is on Coumadin, managed by PCP. He does have bruising of his skin with no other sings of bleeding. I encouraged him to avoid injury and bumping into things.  -He continues to gain more weight with increased eating. He is mildly SOB today. I again discussed his obesity can effect his overall health. I encouraged him to lose weight with change in diet and exercising.    Plan -Lab and F/u in 8 months    No problem-specific Assessment & Plan notes found for this encounter.   No orders of the defined types were placed in this encounter.  All questions were answered. The patient knows to call the clinic with any problems, questions or concerns. No barriers to learning was detected. The total time spent in the appointment was 20 minutes.     [name], MD 08/02/2019   I, Joslyn Devon, am acting as scribe for [name], MD.   I have reviewed the above documentation for accuracy and completeness, and I agree with the above.

## 2019-08-02 ENCOUNTER — Encounter: Payer: Self-pay | Admitting: Hematology

## 2019-08-02 ENCOUNTER — Inpatient Hospital Stay: Payer: Medicare HMO | Attending: Hematology

## 2019-08-02 ENCOUNTER — Other Ambulatory Visit: Payer: Self-pay

## 2019-08-02 ENCOUNTER — Inpatient Hospital Stay (HOSPITAL_BASED_OUTPATIENT_CLINIC_OR_DEPARTMENT_OTHER): Payer: Medicare HMO | Admitting: Hematology

## 2019-08-02 VITALS — BP 159/92 | HR 72 | Temp 98.2°F | Resp 20 | Ht 70.0 in | Wt 288.0 lb

## 2019-08-02 DIAGNOSIS — I509 Heart failure, unspecified: Secondary | ICD-10-CM | POA: Insufficient documentation

## 2019-08-02 DIAGNOSIS — C182 Malignant neoplasm of ascending colon: Secondary | ICD-10-CM

## 2019-08-02 DIAGNOSIS — C180 Malignant neoplasm of cecum: Secondary | ICD-10-CM

## 2019-08-02 DIAGNOSIS — E669 Obesity, unspecified: Secondary | ICD-10-CM | POA: Insufficient documentation

## 2019-08-02 DIAGNOSIS — I110 Hypertensive heart disease with heart failure: Secondary | ICD-10-CM | POA: Diagnosis not present

## 2019-08-02 DIAGNOSIS — R0602 Shortness of breath: Secondary | ICD-10-CM | POA: Insufficient documentation

## 2019-08-02 LAB — COMPREHENSIVE METABOLIC PANEL
ALT: 13 U/L (ref 0–44)
AST: 17 U/L (ref 15–41)
Albumin: 3.8 g/dL (ref 3.5–5.0)
Alkaline Phosphatase: 133 U/L — ABNORMAL HIGH (ref 38–126)
Anion gap: 6 (ref 5–15)
BUN: 17 mg/dL (ref 8–23)
CO2: 31 mmol/L (ref 22–32)
Calcium: 8.9 mg/dL (ref 8.9–10.3)
Chloride: 102 mmol/L (ref 98–111)
Creatinine, Ser: 1 mg/dL (ref 0.61–1.24)
GFR calc Af Amer: >60 mL/min (ref >60)
GFR calc non Af Amer: >60 mL/min (ref >60)
Glucose, Bld: 107 mg/dL — ABNORMAL HIGH (ref 70–99)
Potassium: 3.8 mmol/L (ref 3.5–5.1)
Sodium: 139 mmol/L (ref 135–145)
Total Bilirubin: 0.9 mg/dL (ref 0.3–1.2)
Total Protein: 7.6 g/dL (ref 6.5–8.1)

## 2019-08-02 LAB — CBC WITH DIFFERENTIAL/PLATELET
Abs Immature Granulocytes: 0.02 10*3/uL (ref 0.00–0.07)
Basophils Absolute: 0.1 10*3/uL (ref 0.0–0.1)
Basophils Relative: 2 %
Eosinophils Absolute: 0.2 10*3/uL (ref 0.0–0.5)
Eosinophils Relative: 3 %
HCT: 51.2 % (ref 39.0–52.0)
Hemoglobin: 16 g/dL (ref 13.0–17.0)
Immature Granulocytes: 0 %
Lymphocytes Relative: 19 %
Lymphs Abs: 1 10*3/uL (ref 0.7–4.0)
MCH: 26.9 pg (ref 26.0–34.0)
MCHC: 31.3 g/dL (ref 30.0–36.0)
MCV: 86.2 fL (ref 80.0–100.0)
Monocytes Absolute: 0.7 10*3/uL (ref 0.1–1.0)
Monocytes Relative: 13 %
Neutro Abs: 3.3 10*3/uL (ref 1.7–7.7)
Neutrophils Relative %: 63 %
Platelets: 180 10*3/uL (ref 150–400)
RBC: 5.94 MIL/uL — ABNORMAL HIGH (ref 4.22–5.81)
RDW: 17.5 % — ABNORMAL HIGH (ref 11.5–15.5)
WBC: 5.3 10*3/uL (ref 4.0–10.5)
nRBC: 0 % (ref 0.0–0.2)

## 2019-08-02 LAB — IRON AND TIBC
Iron: 92 ug/dL (ref 42–163)
Saturation Ratios: 28 % (ref 20–55)
TIBC: 326 ug/dL (ref 202–409)
UIBC: 234 ug/dL (ref 117–376)

## 2019-08-02 LAB — FERRITIN: Ferritin: 192 ng/mL (ref 24–336)

## 2019-08-02 LAB — CEA (IN HOUSE-CHCC): CEA (CHCC-In House): 4.01 ng/mL (ref 0.00–5.00)

## 2019-08-08 ENCOUNTER — Telehealth: Payer: Self-pay

## 2019-08-08 ENCOUNTER — Other Ambulatory Visit: Payer: Self-pay | Admitting: Internal Medicine

## 2019-08-08 NOTE — Telephone Encounter (Signed)
lmomed the pt stated that we needed to get a coumadin and dr hilty appt as soon as possible. Will await a call back

## 2019-08-30 NOTE — Progress Notes (Deleted)
{Choose 1 Note Type (Telehealth Visit or Telephone Visit):[id number]}   Date:  08/30/2019   ID:  Steven Barnett, DOB 1950-07-20, MRN [id number]  {Patient Location:[phone]::"Home"} {Provider Location:[phone]::"Home"}  PCP:  Elisabeth Cara, PA-C  Cardiologist:  Pixie Casino, MD *** Electrophysiologist:  None   Evaluation Performed:  {Choose Visit Type:[phone]::"Follow-Up Visit"}  Chief Complaint:  ***  History of Present Illness:    Steven Barnett is a 69 y.o. male with PMH of chronic diastolic CHF, permanent atrial fibrillation on coumadin, HTN, OSA not on CPAP, colon cancer s/p resection/chemotherapy, who presents for ***  On his visit with Dr. [name] 04/2018, he had complaints of daytime somnolence/fatigue but otherwise no cardiac complaints. He was recommended for a repeat sleep study (which again confirmed OSA and CPAP recommended), and follow-up in 1 year.  He follows closely with the coumadin clinic for INR monitoring. On a visit 06/2019 he was noted to have elevated blood pressures. He was seen by pharmacist Tommy Medal 06/25/2019 for management of his HTN, at which time he was recommended to increase his hydralazine to 100mg  BID, as well as arrange a follow-up visit with Dr. Rennie Plowman as he had not been seen since 2019.  His last ischemic evaluation was a NST in 2016 which was without reversible ischemia. Echo in 2017 showed EF 55-60%, moderate LVH, no RWMA, unable to assess LV diastolic function due to afib, moderate LAE, moderate-severe RAE, and mild MR.   He presents today for follow-up of his HTN.   1. HTN: BP *** today - Continue diltiazem, hydralazine, lasix, lisinopril, and metoprolol  2. Permanent atrial fibrillation: rates well controlled. INR followed by the coumadin clinic, though he missed his January appointment.  - Continue diltiazem and metoprolol for rate control - Continue coumadin for stroke ppx  3. Chronic diastolic CHF: no volume  overload complaints *** - Continue metoprolol and lasix  4. OSA: tolerating CPAP *** - Continue CPAP   The patient {does/does not:200015} have symptoms concerning for COVID-19 infection (fever, chills, cough, or new shortness of breath).    Past Medical History:  Diagnosis Date  . Anemia   . Arthralgia of both knees   . Arthritis    "right knee" (10/01/2015)  . Atrial fibrillation ([place])   . Cancer (HCC)    STAGE 1 COLON CANCER  . Chronic anticoagulation 2010   Coumadin  . Chronic diastolic CHF (congestive heart failure), NYHA class 2 ([place])   . Dyspnea   . Dysrhythmia   . Hypertension   . OSA (obstructive sleep apnea) 2016   "couldn't take the mask during the testing" (10/01/2015)  . Permanent atrial fibrillation ([place]) 2010  . Pneumonia 3/29/2017and june 2017   Past Surgical History:  Procedure Laterality Date  . COLONOSCOPY WITH PROPOFOL N/A 03/04/2016   Procedure: COLONOSCOPY WITH PROPOFOL;  Surgeon: Carol Ada, MD;  [facility];  Service: Endoscopy;  Laterality: N/A;  . COLONOSCOPY WITH PROPOFOL N/A 09/15/2017   Procedure: COLONOSCOPY WITH PROPOFOL;  Surgeon: Carol Ada, MD;  [facility];  Service: Endoscopy;  Laterality: N/A;  . LAPAROSCOPIC PARTIAL COLECTOMY N/A 04/12/2016   Procedure: LAPAROSCOPIC ILEOCOLECTOMY;  Surgeon: [name], MD;  Location: [place];  Service: General;  Laterality: N/A;  . PORT-A-CATH REMOVAL N/A 02/16/2017   Procedure: REMOVAL PORT-A-CATH;  Surgeon: [name], MD;  Location: [place];  Service: General;  Laterality: N/A;  . PORTACATH PLACEMENT N/A 05/18/2016   Procedure: INSERTION PORT-A-CATH;  Surgeon: [name], MD;  Location: [place]  [place];  Service: General;  Laterality: N/A;  . THORACOTOMY Right 2010     No outpatient medications have been marked as taking for the 09/02/19 encounter (Appointment) with Abigail Butts., PA-C.     Allergies:   Patient has no known allergies.   Social History    Tobacco Use  . Smoking status: Former Smoker    Packs/day: 0.10    Years: 2.00    Pack years: 0.20    Types: Cigarettes    Quit date: 12/16/1974    Years since quitting: 44.7  . Smokeless tobacco: Never Used  . Tobacco comment: "1 pack cigarettes would last me a month"  Substance Use Topics  . Alcohol use: No    Alcohol/week: 0.0 standard drinks    Comment: 10/01/2015 "might have1 6 pack of beer/year"  . Drug use: No    Types: Cocaine, Marijuana    Comment: Hx polysubstance abuse, quit 2008     Family Hx: The patient's family history includes Bone cancer in his maternal uncle; Cancer in his maternal uncle; Colon cancer in his sister and sister; Colon cancer (age of onset: 34) in his sister; Colon polyps in his brother, father, and mother; Dementia in his mother; Diabetes in his mother; Heart disease in his father; Heart failure in his father; Hyperlipidemia in his mother; Hypertension in his mother; Lung cancer in his paternal grandfather; Stroke in his maternal grandmother and sister. There is no history of Heart attack.  ROS:   Please see the history of present illness.    *** All other systems reviewed and are negative.   Prior CV studies:   The following studies were reviewed today:  NST 2016:  This is a low risk study.  Non-gated due to atrial fibrillation  No reversible perfusion defects   Low risk study. No reversible ischemia. Non-gated due to atrial fibrillation.  Echocardiogram 2017: - Left ventricle: The cavity size was normal. Wall thickness was  increased in a pattern of moderate LVH. Systolic function was  normal. The estimated ejection fraction was in the range of 55%  to 60%. Wall motion was normal; there were no regional wall  motion abnormalities. The study is not technically sufficient to  allow evaluation of LV diastolic function.  - Aortic valve: Trileaflet. Sclerosis without stenosis. There was  no regurgitation.  - Mitral valve:  There was mild regurgitation.  - Left atrium: Moderately dilated at 45 ml/m2.  - Right ventricle: The cavity size was mildly dilated.  - Right atrium: Moderate to severely dilated.  - Inferior vena cava: The vessel was normal in size. The  respirophasic diameter changes were in the normal range (>= 50%),  consistent with normal central venous pressure.   Impressions:   - Compared to the prior echo in 2010, LVEF remains unchanged.    Labs/Other Tests and Data Reviewed:    EKG:  No ECG reviewed.  Recent Labs: 08/02/2019: ALT 13; BUN 17; Creatinine, Ser 1.00; Hemoglobin 16.0; Platelets 180; Potassium 3.8; Sodium 139   Recent Lipid Panel Lab Results  Component Value Date/Time   CHOL  11/20/2008 04:20 AM    93        ATP III CLASSIFICATION:  <200     mg/dL   Desirable  200-239  mg/dL   Borderline High  >=240    mg/dL   High          TRIG 31 11/20/2008 04:20 AM   HDL 42 11/20/2008 04:20 AM  CHOLHDL 2.2 11/20/2008 04:20 AM   LDLCALC  11/20/2008 04:20 AM    45        Total Cholesterol/HDL:CHD Risk Coronary Heart Disease Risk Table                     Men   Women  1/2 Average Risk   3.4   3.3  Average Risk       5.0   4.4  2 X Average Risk   9.6   7.1  3 X Average Risk  23.4   11.0        Use the calculated Patient Ratio above and the CHD Risk Table to determine the patient's CHD Risk.        ATP III CLASSIFICATION (LDL):  <100     mg/dL   Optimal  100-129  mg/dL   Near or Above                    Optimal  130-159  mg/dL   Borderline  160-189  mg/dL   High  >190     mg/dL   Very High    Wt Readings from Last 3 Encounters:  08/02/19 288 lb (130.6 kg)  06/25/19 286 lb 6.4 oz (129.9 kg)  01/31/19 281 lb 1.6 oz (127.5 kg)     Objective:    Vital Signs:  There were no vitals taken for this visit.   {[facility] Virtual Exam (Optional):[phone]::"VITAL SIGNS:  reviewed"}  ASSESSMENT & PLAN:    1. ***  COVID-19 Education: The signs and symptoms of COVID-19  were discussed with the patient and how to seek care for testing (follow up with PCP or arrange E-visit).  ***The importance of social distancing was discussed today.  Time:   Today, I have spent *** minutes with the patient with telehealth technology discussing the above problems.     Medication Adjustments/Labs and Tests Ordered: Current medicines are reviewed at length with the patient today.  Concerns regarding medicines are outlined above.   Tests Ordered: No orders of the defined types were placed in this encounter.   Medication Changes: No orders of the defined types were placed in this encounter.   Follow Up:  {F/U Format:[phone]} {follow up:15908}  Signed, Abigail Butts, PA-C  08/30/2019 9:04 PM    [facility]

## 2019-09-02 ENCOUNTER — Telehealth: Payer: Medicare HMO | Admitting: Medical

## 2019-09-02 ENCOUNTER — Telehealth: Payer: Self-pay | Admitting: Medical

## 2019-09-02 NOTE — Telephone Encounter (Signed)
Left message informing pt of new appt date and time. Informed he will need to come into the office since has not been seen in a while. He has been rescheduled to an office visit on 09/05/19 at 2:45 PM with Roby Lofts, PA.

## 2019-09-03 ENCOUNTER — Telehealth: Payer: Medicare HMO | Admitting: Medical

## 2019-09-04 ENCOUNTER — Other Ambulatory Visit: Payer: Self-pay | Admitting: Internal Medicine

## 2019-09-05 ENCOUNTER — Ambulatory Visit: Payer: Medicare HMO | Admitting: Medical

## 2019-09-18 ENCOUNTER — Ambulatory Visit: Payer: Medicare HMO | Admitting: Medical

## 2019-09-18 NOTE — Progress Notes (Deleted)
Cardiology Office Note   Date:  09/18/2019   ID:  Steven, Barnett [birth year], MRN [id number]  PCP:  Elisabeth Cara, PA-C  Cardiologist:  Pixie Casino, MD EP: None  No chief complaint on file.     History of Present Illness: Steven Barnett is a 69 y.o. male who presents for ***    Past Medical History:  Diagnosis Date  . Anemia   . Arthralgia of both knees   . Arthritis    "right knee" (10/01/2015)  . Atrial fibrillation ([place])   . Cancer (HCC)    STAGE 1 COLON CANCER  . Chronic anticoagulation 2010   Coumadin  . Chronic diastolic CHF (congestive heart failure), NYHA class 2 ([place])   . Dyspnea   . Dysrhythmia   . Hypertension   . OSA (obstructive sleep apnea) 2016   "couldn't take the mask during the testing" (10/01/2015)  . Permanent atrial fibrillation ([place]) 2010  . Pneumonia 3/29/2017and june 2017    Past Surgical History:  Procedure Laterality Date  . COLONOSCOPY WITH PROPOFOL N/A 03/04/2016   Procedure: COLONOSCOPY WITH PROPOFOL;  Surgeon: Carol Ada, MD;  Location: [facility];  Service: Endoscopy;  Laterality: N/A;  . COLONOSCOPY WITH PROPOFOL N/A 09/15/2017   Procedure: COLONOSCOPY WITH PROPOFOL;  Surgeon: Carol Ada, MD;  Location: [facility];  Service: Endoscopy;  Laterality: N/A;  . LAPAROSCOPIC PARTIAL COLECTOMY N/A 04/12/2016   Procedure: LAPAROSCOPIC ILEOCOLECTOMY;  Surgeon: [name], MD;  Location: [place];  Service: General;  Laterality: N/A;  . PORT-A-CATH REMOVAL N/A 02/16/2017   Procedure: REMOVAL PORT-A-CATH;  Surgeon: [name], MD;  Location: [place];  Service: General;  Laterality: N/A;  . PORTACATH PLACEMENT N/A 05/18/2016   Procedure: INSERTION PORT-A-CATH;  Surgeon: [name], MD;  Location: [place];  Service: General;  Laterality: N/A;  . THORACOTOMY Right 2010     Current Outpatient Medications  Medication Sig Dispense Refill  . acetaminophen (TYLENOL) 500 MG tablet Take 1,000 mg by mouth 2  (two) times daily.    Marland Kitchen albuterol (PROVENTIL HFA;VENTOLIN HFA) 108 (90 Base) MCG/ACT inhaler Inhale 2 puffs into the lungs every 6 (six) hours as needed for wheezing or shortness of breath. 1 Inhaler 2  . aspirin EC 81 MG tablet Take 81 mg by mouth daily.    . colchicine 0.6 MG tablet Take 0.6 mg by mouth 2 (two) times daily.    Marland Kitchen diltiazem (CARDIZEM) 120 MG tablet TAKE 1 TABLET BY MOUTH ONCE DAILY 30 tablet 6  . furosemide (LASIX) 80 MG tablet TAKE 1 TABLET BY MOUTH TWICE DAILY . APPOINTMENT REQUIRED FOR FUTURE REFILLS 180 tablet 0  . hydrALAZINE (APRESOLINE) 100 MG tablet Take 1 tablet (100 mg total) by mouth 2 (two) times daily. 180 tablet 3  . ipratropium (ATROVENT HFA) 17 MCG/ACT inhaler Inhale 2 puffs into the lungs every 6 (six) hours as needed for wheezing.     Marland Kitchen lisinopril (ZESTRIL) 20 MG tablet Take 1 tablet (20 mg total) by mouth daily. 90 tablet 2  . metoprolol tartrate (LOPRESSOR) 25 MG tablet Take 1 tablet by mouth twice daily 180 tablet 3  . potassium chloride SA (KLOR-CON) 20 MEQ tablet Take 1 tablet (20 mEq total) by mouth daily. 30 tablet 0  . warfarin (COUMADIN) 4 MG tablet TAKE 2 TO 3 TABLETS BY MOUTH ONCE DAILY AS DIRECTED BY  COUMADIN  CLINIC 270 tablet 0   No current facility-administered medications for this visit.  Facility-Administered Medications Ordered in Other Visits  Medication Dose Route Frequency Provider Last Rate Last Admin  . heparin lock flush 100 unit/mL  500 Units Intravenous Once PRN [name], MD      . sodium chloride flush (NS) 0.9 % injection 10 mL  10 mL Intravenous PRN [name], MD        Allergies:   Patient has no known allergies.    Social History:  The patient  reports that he quit smoking about 44 years ago. His smoking use included cigarettes. He has a 0.20 pack-year smoking history. He has never used smokeless tobacco. He reports that he does not drink alcohol or use drugs.   Family History:  The patient's ***family history includes  Bone cancer in his maternal uncle; Cancer in his maternal uncle; Colon cancer in his sister and sister; Colon cancer (age of onset: 27) in his sister; Colon polyps in his brother, father, and mother; Dementia in his mother; Diabetes in his mother; Heart disease in his father; Heart failure in his father; Hyperlipidemia in his mother; Hypertension in his mother; Lung cancer in his paternal grandfather; Stroke in his maternal grandmother and sister.    ROS:  Please see the history of present illness.   Otherwise, review of systems are positive for {NONE DEFAULTED:18576::"none"}.   All other systems are reviewed and negative.    PHYSICAL EXAM: VS:  There were no vitals taken for this visit. , BMI There is no height or weight on file to calculate BMI. GEN: Well nourished, well developed, in no acute distress HEENT: normal Neck: no JVD, carotid bruits, or masses Cardiac: ***RRR; no murmurs, rubs, or gallops,no edema  Respiratory:  clear to auscultation bilaterally, normal work of breathing GI: soft, nontender, nondistended, + BS MS: no deformity or atrophy Skin: warm and dry, no rash Neuro:  Strength and sensation are intact Psych: euthymic mood, full affect   EKG:  EKG {ACTION; IS/IS PXT:[id number]} ordered today. The ekg ordered today demonstrates ***   Recent Labs: 08/02/2019: ALT 13; BUN 17; Creatinine, Ser 1.00; Hemoglobin 16.0; Platelets 180; Potassium 3.8; Sodium 139    Lipid Panel    Component Value Date/Time   CHOL  11/20/2008 0420    93        ATP III CLASSIFICATION:  <200     mg/dL   Desirable  200-239  mg/dL   Borderline High  >=240    mg/dL   High          TRIG 31 11/20/2008 0420   HDL 42 11/20/2008 0420   CHOLHDL 2.2 11/20/2008 0420   VLDL 6 11/20/2008 0420   LDLCALC  11/20/2008 0420    45        Total Cholesterol/HDL:CHD Risk Coronary Heart Disease Risk Table                     Men   Women  1/2 Average Risk   3.4   3.3  Average Risk       5.0   4.4  2 X  Average Risk   9.6   7.1  3 X Average Risk  23.4   11.0        Use the calculated Patient Ratio above and the CHD Risk Table to determine the patient's CHD Risk.        ATP III CLASSIFICATION (LDL):  <100     mg/dL   Optimal  100-129  mg/dL   Near or  Above                    Optimal  130-159  mg/dL   Borderline  160-189  mg/dL   High  >190     mg/dL   Very High      Wt Readings from Last 3 Encounters:  08/02/19 288 lb (130.6 kg)  06/25/19 286 lb 6.4 oz (129.9 kg)  01/31/19 281 lb 1.6 oz (127.5 kg)      Other studies Reviewed: Additional studies/ records that were reviewed today include: ***.    ASSESSMENT AND PLAN:  1.  ***   Current medicines are reviewed at length with the patient today.  The patient {ACTIONS; HAS/DOES NOT HAVE:19233} concerns regarding medicines.  The following changes have been made:  {PLAN; NO CHANGE:13088:s}  Labs/ tests ordered today include: *** No orders of the defined types were placed in this encounter.    Disposition:   FU with *** in {gen number [id number]} {Days to years:10300}  Signed, Abigail Butts, PA-C  09/18/2019 12:07 AM

## 2019-09-27 ENCOUNTER — Other Ambulatory Visit: Payer: Self-pay | Admitting: Cardiovascular Disease

## 2019-10-11 ENCOUNTER — Ambulatory Visit: Payer: Medicare HMO | Admitting: Physician Assistant

## 2019-10-15 ENCOUNTER — Ambulatory Visit: Payer: Medicare HMO | Admitting: Physician Assistant

## 2019-10-15 NOTE — Progress Notes (Deleted)
Cardiology Office Note:    Date:  10/15/2019   ID:  Steven Barnett, DOB 11/23/1950, MRN [id number]  PCP:  Elisabeth Cara, PA-C  Cardiologist:  Pixie Casino, MD  Electrophysiologist:  None   Referring MD: Elisabeth Cara, *   No chief complaint on file. ***  History of Present Illness:    Steven Barnett is a 69 y.o. male with a hx of HTN, OSA, chronic diastolic heart failure, and permanent atrial fibrillation.  He underwent thoracotomy in May 2010 after diagnosing with right-sided empyema.  He was found to have postop atrial fibrillation at the time, however has remained in A. fib ever since.  Past Medical History:  Diagnosis Date  . Anemia   . Arthralgia of both knees   . Arthritis    "right knee" (10/01/2015)  . Atrial fibrillation ([place])   . Cancer (HCC)    STAGE 1 COLON CANCER  . Chronic anticoagulation 2010   Coumadin  . Chronic diastolic CHF (congestive heart failure), NYHA class 2 ([place])   . Dyspnea   . Dysrhythmia   . Hypertension   . OSA (obstructive sleep apnea) 2016   "couldn't take the mask during the testing" (10/01/2015)  . Permanent atrial fibrillation ([place]) 2010  . Pneumonia 3/29/2017and june 2017    Past Surgical History:  Procedure Laterality Date  . COLONOSCOPY WITH PROPOFOL N/A 03/04/2016   Procedure: COLONOSCOPY WITH PROPOFOL;  Surgeon: Carol Ada, MD;  [facility];  Service: Endoscopy;  Laterality: N/A;  . COLONOSCOPY WITH PROPOFOL N/A 09/15/2017   Procedure: COLONOSCOPY WITH PROPOFOL;  Surgeon: Carol Ada, MD;  [facility];  Service: Endoscopy;  Laterality: N/A;  . LAPAROSCOPIC PARTIAL COLECTOMY N/A 04/12/2016   Procedure: LAPAROSCOPIC ILEOCOLECTOMY;  Surgeon: [name], MD;  Location: [place];  Service: General;  Laterality: N/A;  . PORT-A-CATH REMOVAL N/A 02/16/2017   Procedure: REMOVAL PORT-A-CATH;  Surgeon: [name], MD;  Location: [place];  Service: General;  Laterality: N/A;  . PORTACATH PLACEMENT N/A  05/18/2016   Procedure: INSERTION PORT-A-CATH;  Surgeon: [name], MD;  Location: [place];  Service: General;  Laterality: N/A;  . THORACOTOMY Right 2010    Current Medications: No outpatient medications have been marked as taking for the 10/15/19 encounter (Appointment) with Almyra Deforest, [place].     Allergies:   Patient has no known allergies.   Social History   Socioeconomic History  . Marital status: Married    Spouse name: Ronnette Juniper  . Number of children: Not on file  . Years of education: Not on file  . Highest education level: Not on file  Occupational History  . Not on file  Tobacco Use  . Smoking status: Former Smoker    Packs/day: 0.10    Years: 2.00    Pack years: 0.20    Types: Cigarettes    Quit date: 12/16/1974    Years since quitting: 44.8  . Smokeless tobacco: Never Used  . Tobacco comment: "1 pack cigarettes would last me a month"  Substance and Sexual Activity  . Alcohol use: No    Alcohol/week: 0.0 standard drinks    Comment: 10/01/2015 "might have1 6 pack of beer/year"  . Drug use: No    Types: Cocaine, Marijuana    Comment: Hx polysubstance abuse, quit 2008  . Sexual activity: Not Currently  Other Topics Concern  . Not on file  Social History Narrative   Married, wife Ronnette Juniper (since 10)   Social Determinants  of Health   Financial Resource Strain:   . Difficulty of Paying Living Expenses:   Food Insecurity:   . Worried About Charity fundraiser in the Last Year:   . Arboriculturist in the Last Year:   Transportation Needs:   . Film/video editor (Medical):   Marland Kitchen Lack of Transportation (Non-Medical):   Physical Activity:   . Days of Exercise per Week:   . Minutes of Exercise per Session:   Stress:   . Feeling of Stress :   Social Connections:   . Frequency of Communication with Friends and Family:   . Frequency of Social Gatherings with Friends and Family:   . Attends Religious Services:   . Active Member of Clubs or  Organizations:   . Attends Archivist Meetings:   Marland Kitchen Marital Status:      Family History: The patient's ***family history includes Bone cancer in his maternal uncle; Cancer in his maternal uncle; Colon cancer in his sister and sister; Colon cancer (age of onset: 20) in his sister; Colon polyps in his brother, father, and mother; Dementia in his mother; Diabetes in his mother; Heart disease in his father; Heart failure in his father; Hyperlipidemia in his mother; Hypertension in his mother; Lung cancer in his paternal grandfather; Stroke in his maternal grandmother and sister. There is no history of Heart attack.  ROS:   Please see the history of present illness.    *** All other systems reviewed and are negative.  EKGs/Labs/Other Studies Reviewed:    The following studies were reviewed today: ***  EKG:  EKG is *** ordered today.  The ekg ordered today demonstrates ***  Recent Labs: 08/02/2019: ALT 13; BUN 17; Creatinine, Ser 1.00; Hemoglobin 16.0; Platelets 180; Potassium 3.8; Sodium 139  Recent Lipid Panel    Component Value Date/Time   CHOL  11/20/2008 0420    93        ATP III CLASSIFICATION:  <200     mg/dL   Desirable  200-239  mg/dL   Borderline High  >=240    mg/dL   High          TRIG 31 11/20/2008 0420   HDL 42 11/20/2008 0420   CHOLHDL 2.2 11/20/2008 0420   VLDL 6 11/20/2008 0420   LDLCALC  11/20/2008 0420    45        Total Cholesterol/HDL:CHD Risk Coronary Heart Disease Risk Table                     Men   Women  1/2 Average Risk   3.4   3.3  Average Risk       5.0   4.4  2 X Average Risk   9.6   7.1  3 X Average Risk  23.4   11.0        Use the calculated Patient Ratio above and the CHD Risk Table to determine the patient's CHD Risk.        ATP III CLASSIFICATION (LDL):  <100     mg/dL   Optimal  100-129  mg/dL   Near or Above                    Optimal  130-159  mg/dL   Borderline  160-189  mg/dL   High  >190     mg/dL   Very High     Physical Exam:    VS:  There  were no vitals taken for this visit.    Wt Readings from Last 3 Encounters:  08/02/19 288 lb (130.6 kg)  06/25/19 286 lb 6.4 oz (129.9 kg)  01/31/19 281 lb 1.6 oz (127.5 kg)     GEN: *** Well nourished, well developed in no acute distress HEENT: Normal NECK: No JVD; No carotid bruits LYMPHATICS: No lymphadenopathy CARDIAC: ***RRR, no murmurs, rubs, gallops RESPIRATORY:  Clear to auscultation without rales, wheezing or rhonchi  ABDOMEN: Soft, non-tender, non-distended MUSCULOSKELETAL:  No edema; No deformity  SKIN: Warm and dry NEUROLOGIC:  Alert and oriented x 3 PSYCHIATRIC:  Normal affect   ASSESSMENT:    No diagnosis found. PLAN:    In order of problems listed above:  1. ***   Medication Adjustments/Labs and Tests Ordered: Current medicines are reviewed at length with the patient today.  Concerns regarding medicines are outlined above.  No orders of the defined types were placed in this encounter.  No orders of the defined types were placed in this encounter.   There are no Patient Instructions on file for this visit.   Hilbert Corrigan, [place]  10/15/2019 2:11 PM    [facility]

## 2019-10-16 ENCOUNTER — Other Ambulatory Visit: Payer: Self-pay | Admitting: Cardiovascular Disease

## 2019-10-16 ENCOUNTER — Other Ambulatory Visit: Payer: Self-pay | Admitting: Internal Medicine

## 2019-10-17 ENCOUNTER — Other Ambulatory Visit: Payer: Self-pay

## 2019-10-17 ENCOUNTER — Ambulatory Visit (INDEPENDENT_AMBULATORY_CARE_PROVIDER_SITE_OTHER): Payer: Medicare HMO | Admitting: Pharmacist Clinician (PhC)/ Clinical Pharmacy Specialist

## 2019-10-17 DIAGNOSIS — I4821 Permanent atrial fibrillation: Secondary | ICD-10-CM

## 2019-10-17 DIAGNOSIS — Z5181 Encounter for therapeutic drug level monitoring: Secondary | ICD-10-CM | POA: Diagnosis not present

## 2019-10-17 LAB — POCT INR: INR: 2.2 (ref 2.0–3.0)

## 2019-10-28 NOTE — Progress Notes (Signed)
Cardiology Office Note   Date:  11/01/2019   ID:  Steven, Barnett [birth year], MRN [id number]  PCP:  Elisabeth Cara, PA-C  Cardiologist:  Pixie Casino, MD EP: None  Chief Complaint  Patient presents with  . Follow-up    CHF      History of Present Illness: Steven Barnett is a 69 y.o. male with a PMH of chronic diastolic CHF, permanent atrial fibrillation, HTN, and OSA intolerant to CPAP, who presents for follow-up of SOB.  He was seen by pharmacy 06/25/2019 for a HTN visit at which time his blood pressure was elevated and his hydralazine was increased to 100mg  BID, with plans to recheck his blood pressure at his follow-up INR visit, though had numerous appointment cancellations since that time. Prior to this his last general cardiology visit was 04/2018 with Dr. Debara Barnett, at which time he had complaints of daytime fatigue and sleepiness but no other cardiac complaints.   His last ischemic evaluation was a stress test in 2016 which was without reversible ischemia. Echo in 2017 showed EF 55-60%, no RWMA, indeterminate LV diastolic function, mild MR and moderate biatrial enlargement.   He presents today for routine follow-up after several cancelled visits. He reports feeling SOB, predominately with activity. Also with bendopnea and orthopnea. He has been sleeping with 3-4 pillows. None of these issues sound terribly acute - he denies any significant change in the past several months. He has some LE edema which is typically managed with his lasix. He denies chest pain, palpitations, dizziness, lightheadedness, or syncope. He still has fatigue and daytime somnolence though was intolerant to CPAP. No complaints of bleeding with coumadin. INR check today with pharmacy.    Past Medical History:  Diagnosis Date  . Anemia   . Arthralgia of both knees   . Arthritis    "right knee" (10/01/2015)  . Atrial fibrillation ([place])   . Cancer (HCC)    STAGE 1 COLON CANCER  . Chronic  anticoagulation 2010   Coumadin  . Chronic diastolic CHF (congestive heart failure), NYHA class 2 ([facility])   . Dyspnea   . Dysrhythmia   . Hypertension   . OSA (obstructive sleep apnea) 2016   "couldn't take the mask during the testing" (10/01/2015)  . Permanent atrial fibrillation ([facility]) 2010  . Pneumonia 3/29/2017and june 2017    Past Surgical History:  Procedure Laterality Date  . COLONOSCOPY WITH PROPOFOL N/A 03/04/2016   Procedure: COLONOSCOPY WITH PROPOFOL;  Surgeon: Carol Ada, MD;  Location: [facility];  Service: Endoscopy;  Laterality: N/A;  . COLONOSCOPY WITH PROPOFOL N/A 09/15/2017   Procedure: COLONOSCOPY WITH PROPOFOL;  Surgeon: Carol Ada, MD;  Location: [facility];  Service: Endoscopy;  Laterality: N/A;  . LAPAROSCOPIC PARTIAL COLECTOMY N/A 04/12/2016   Procedure: LAPAROSCOPIC ILEOCOLECTOMY;  Surgeon: Steven Klein, MD;  Location: [facility];  Service: General;  Laterality: N/A;  . PORT-A-CATH REMOVAL N/A 02/16/2017   Procedure: REMOVAL PORT-A-CATH;  Surgeon: Steven Klein, MD;  Location: [facility];  Service: General;  Laterality: N/A;  . PORTACATH PLACEMENT N/A 05/18/2016   Procedure: INSERTION PORT-A-CATH;  Surgeon: Steven Klein, MD;  Location: [facility];  Service: General;  Laterality: N/A;  . THORACOTOMY Right 2010     Current Outpatient Medications  Medication Sig Dispense Refill  . acetaminophen (TYLENOL) 500 MG tablet Take 1,000 mg by mouth 2 (two) times daily.    Marland Kitchen albuterol (PROVENTIL HFA;VENTOLIN HFA) 108 (90 Base) MCG/ACT inhaler Inhale 2 puffs  into the lungs every 6 (six) hours as needed for wheezing or shortness of breath. 1 Inhaler 2  . aspirin EC 81 MG tablet Take 81 mg by mouth daily.    . colchicine 0.6 MG tablet Take 0.6 mg by mouth 2 (two) times daily.    Marland Kitchen diltiazem (CARDIZEM) 120 MG tablet TAKE 1 TABLET BY MOUTH ONCE DAILY 30 tablet 6  . hydrALAZINE (APRESOLINE) 100 MG tablet Take 1 tablet (100 mg total) by mouth 2 (two) times daily. 180  tablet 3  . ipratropium (ATROVENT HFA) 17 MCG/ACT inhaler Inhale 2 puffs into the lungs every 6 (six) hours as needed for wheezing.     . metoprolol tartrate (LOPRESSOR) 25 MG tablet Take 1 tablet by mouth twice daily 180 tablet 3  . potassium chloride SA (KLOR-CON) 20 MEQ tablet TAKE 1  BY MOUTH ONCE DAILY 30 tablet 0  . warfarin (COUMADIN) 4 MG tablet TAKE 2 TO 3 TABLETS BY MOUTH ONCE DAILY AS  DIRECTED  BY  COUMADIN  CLINIC.  MUST  KEEP  APPT  FOR  FURTHER  REFILLS. 36 tablet 0  . furosemide (LASIX) 80 MG tablet Take 0.5 tablets (40 mg total) by mouth 2 (two) times daily. 180 tablet 0  . metolazone (ZAROXOLYN) 5 MG tablet Take 1 tablet 2 times a week on Wednesdays and Saturdays. 15 tablet 3  . valsartan (DIOVAN) 320 MG tablet Take 1 tablet (320 mg total) by mouth daily. 90 tablet 3   No current facility-administered medications for this visit.   Facility-Administered Medications Ordered in Other Visits  Medication Dose Route Frequency Provider Last Rate Last Admin  . heparin lock flush 100 unit/mL  500 Units Intravenous Once PRN [name], MD      . sodium chloride flush (NS) 0.9 % injection 10 mL  10 mL Intravenous PRN [name], MD        Allergies:   Patient has no known allergies.    Social History:  The patient  reports that he quit smoking about 44 years ago. His smoking use included cigarettes. He has a 0.20 pack-year smoking history. He has never used smokeless tobacco. He reports that he does not drink alcohol or use drugs.   Family History:  The patient's family history includes Bone cancer in his maternal uncle; Cancer in his maternal uncle; Colon cancer in his sister and sister; Colon cancer (age of onset: 40) in his sister; Colon polyps in his brother, father, and mother; Dementia in his mother; Diabetes in his mother; Heart disease in his father; Heart failure in his father; Hyperlipidemia in his mother; Hypertension in his mother; Lung cancer in his paternal grandfather;  Stroke in his maternal grandmother and sister.    ROS:  Please see the history of present illness.   Otherwise, review of systems are positive for none.   All other systems are reviewed and negative.    PHYSICAL EXAM: VS:  BP (!) 148/90   Pulse 73   Ht 5\' 11"  (1.803 m)   Wt 287 lb (130.2 kg)   SpO2 94%   BMI 40.03 kg/m  , BMI Body mass index is 40.03 kg/m. GEN: Well nourished, well developed, in no acute distress HEENT: scleara anicteric Neck: no JVD, carotid bruits, or masses Cardiac: IRIR; no murmurs, rubs, or gallops, trace LE edema  Respiratory: conversationally SOB, clear to auscultation bilaterally GI: soft, protuberant, nontender, + BS MS: no deformity or atrophy Skin: warm and dry, no rash Neuro:  Strength and sensation are intact Psych: euthymic mood, full affect   EKG:  EKG is ordered today. The ekg ordered today demonstrates atrial fibrillation with PVC, rate 73 bpm, no STE/D, isolated TWI in aVL.   Recent Labs: 08/02/2019: ALT 13; BUN 17; Creatinine, Ser 1.00; Hemoglobin 16.0; Platelets 180; Potassium 3.8; Sodium 139    Lipid Panel    Component Value Date/Time   CHOL  11/20/2008 0420    93        ATP III CLASSIFICATION:  <200     mg/dL   Desirable  200-239  mg/dL   Borderline High  >=240    mg/dL   High          TRIG 31 11/20/2008 0420   HDL 42 11/20/2008 0420   CHOLHDL 2.2 11/20/2008 0420   VLDL 6 11/20/2008 0420   LDLCALC  11/20/2008 0420    45        Total Cholesterol/HDL:CHD Risk Coronary Heart Disease Risk Table                     Men   Women  1/2 Average Risk   3.4   3.3  Average Risk       5.0   4.4  2 X Average Risk   9.6   7.1  3 X Average Risk  23.4   11.0        Use the calculated Patient Ratio above and the CHD Risk Table to determine the patient's CHD Risk.        ATP III CLASSIFICATION (LDL):  <100     mg/dL   Optimal  100-129  mg/dL   Near or Above                    Optimal  130-159  mg/dL   Borderline  160-189  mg/dL    High  >190     mg/dL   Very High      Wt Readings from Last 3 Encounters:  10/29/19 287 lb (130.2 kg)  08/02/19 288 lb (130.6 kg)  06/25/19 286 lb 6.4 oz (129.9 kg)      Other studies Reviewed: Additional studies/ records that were reviewed today include:   NST 2016:  This is a low risk study.  Non-gated due to atrial fibrillation  No reversible perfusion defects   Low risk study. No reversible ischemia. Non-gated due to atrial fibrillation.  Echocardiogram 2017: - Left ventricle: The cavity size was normal. Wall thickness was  increased in a pattern of moderate LVH. Systolic function was  normal. The estimated ejection fraction was in the range of 55%  to 60%. Wall motion was normal; there were no regional wall  motion abnormalities. The study is not technically sufficient to  allow evaluation of LV diastolic function.  - Aortic valve: Trileaflet. Sclerosis without stenosis. There was  no regurgitation.  - Mitral valve: There was mild regurgitation.  - Left atrium: Moderately dilated at 45 ml/m2.  - Right ventricle: The cavity size was mildly dilated.  - Right atrium: Moderate to severely dilated.  - Inferior vena cava: The vessel was normal in size. The  respirophasic diameter changes were in the normal range (>= 50%),  consistent with normal central venous pressure.   Impressions:   - Compared to the prior echo in 2010, LVEF remains unchanged.    ASSESSMENT AND PLAN:  1. Acute on chronic diastolic CHF: patient has had persistent SOB over the  past several months. Seems mildly SOB on exam, though lungs are clear and he has only trace LE edema.  - Will update his echo - Will add metolazone 5mg  2 times per week - Wed/Sat - Will stop lisinopril and start valsartan 320mg  daily for better BP control - Plan to repeat BMET at follow-up visit in 2 weeks - Encouraged daily weights and low salt diet.   2. Permanent atrial fibrillation: rate well  controlled today. No complaints of palpitation. INR check with pharmacy today - 2.3. No complaints of bleeding - Continue metoprolol and diltiazem for rate control - Continue coumadin for stroke ppx  3. HTN: BP 142/90 today; goal <13/80 - Will transition from lisinopril to valsartan 320mg  daily for improved BP control - Continue metoprolol, diltiazem, hydralazine, and lasix  4. OSA: intolerant to CPAP   Current medicines are reviewed at length with the patient today.  The patient does not have concerns regarding medicines.  The following changes have been made:  As above  Labs/ tests ordered today include:    Orders Placed This Encounter  Procedures  . EKG 12-Lead  . ECHOCARDIOGRAM COMPLETE     Disposition:   FU with me in 2 weeks  Signed, Abigail Butts, PA-C  11/01/2019 9:55 PM

## 2019-10-29 ENCOUNTER — Other Ambulatory Visit: Payer: Self-pay

## 2019-10-29 ENCOUNTER — Ambulatory Visit (INDEPENDENT_AMBULATORY_CARE_PROVIDER_SITE_OTHER): Payer: Medicare HMO | Admitting: Medical

## 2019-10-29 ENCOUNTER — Encounter: Payer: Self-pay | Admitting: Medical

## 2019-10-29 ENCOUNTER — Ambulatory Visit (INDEPENDENT_AMBULATORY_CARE_PROVIDER_SITE_OTHER): Payer: Medicare HMO | Admitting: Pharmacist Clinician (PhC)/ Clinical Pharmacy Specialist

## 2019-10-29 VITALS — BP 148/90 | HR 73 | Ht 71.0 in | Wt 287.0 lb

## 2019-10-29 DIAGNOSIS — I4821 Permanent atrial fibrillation: Secondary | ICD-10-CM | POA: Diagnosis not present

## 2019-10-29 DIAGNOSIS — I10 Essential (primary) hypertension: Secondary | ICD-10-CM | POA: Diagnosis not present

## 2019-10-29 DIAGNOSIS — I5033 Acute on chronic diastolic (congestive) heart failure: Secondary | ICD-10-CM | POA: Diagnosis not present

## 2019-10-29 DIAGNOSIS — Z5181 Encounter for therapeutic drug level monitoring: Secondary | ICD-10-CM

## 2019-10-29 DIAGNOSIS — G4733 Obstructive sleep apnea (adult) (pediatric): Secondary | ICD-10-CM | POA: Diagnosis not present

## 2019-10-29 LAB — POCT INR: INR: 2.3 (ref 2.0–3.0)

## 2019-10-29 MED ORDER — METOLAZONE 5 MG PO TABS: ORAL_TABLET | ORAL | 3 refills | Status: DC

## 2019-10-29 MED ORDER — VALSARTAN 320 MG PO TABS: 320.0000 mg | ORAL_TABLET | Freq: Every day | ORAL | 3 refills | Status: DC

## 2019-10-29 NOTE — Patient Instructions (Addendum)
Medication Instructions:   STOP LISINOPRIL  START METOLAZONE 5 MG- TAKE 1 TABLET 2 TIMES A WEEK ON WEDNESDAYS AND SATURDAYS  START VALSARTAN 320 MG DAILY  *If you need a refill on your cardiac medications before your next appointment, please call your pharmacy*  Lab Work: NONE ordered at this time of appointment   If you have labs (blood work) drawn today and your tests are completely normal, you will receive your results only by: Marland Kitchen MyChart Message (if you have MyChart) OR . A paper copy in the mail If you have any lab test that is abnormal or we need to change your treatment, we will call you to review the results.  Testing/Procedures: Your physician has requested that you have an echocardiogram. Echocardiography is a painless test that uses sound waves to create images of your heart. It provides your doctor with information about the size and shape of your heart and how well your heart's chambers and valves are working. This procedure takes approximately one hour. There are no restrictions for this procedure.   Please schedule for 1 week   Follow-Up: At [facility], you and your health needs are our priority.  As part of our continuing mission to provide you with exceptional heart care, we have created designated Provider Care Teams.  These Care Teams include your primary Cardiologist (physician) and Advanced Practice Providers (APPs -  Physician Assistants and Nurse Practitioners) who all work together to provide you with the care you need, when you need it.  Your next appointment:   2 week(s)  The format for your next appointment:   In Person  Provider:   Roby Lofts, PA-C  Other Instructions   Heart Failure Education: 1. Weigh yourself EVERY morning after you go to the bathroom but before you eat or drink anything. Write this number down in a weight log/diary. If you gain 3 pounds overnight or 5 pounds in a week, call the office. 2. Take your medicines as  prescribed. If you have concerns about your medications, please call us before you stop taking them.  3. Eat low salt foods--Limit salt (sodium) to 2000 mg per day. This will help prevent your body from holding onto fluid. Read food labels as many processed foods have a lot of sodium, especially canned goods and prepackaged meats. If you would like some assistance choosing low sodium foods, we would be happy to set you up with a nutritionist. 4. Stay as active as you can everyday. Staying active will give you more energy and make your muscles stronger. Start with 5 minutes at a time and work your way up to 30 minutes a day. Break up your activities--do some in the morning and some in the afternoon. Start with 3 days per week and work your way up to 5 days as you can.  If you have chest pain, feel short of breath, dizzy, or lightheaded, STOP. If you don't feel better after a short rest, call 911. If you do feel better, call the office to let us know you have symptoms with exercise. 5. Limit all fluids for the day to less than 2 liters. Fluid includes all drinks, coffee, juice, ice chips, soup, jello, and all other liquids.

## 2019-10-30 ENCOUNTER — Other Ambulatory Visit: Payer: Self-pay | Admitting: Internal Medicine

## 2019-11-01 ENCOUNTER — Encounter: Payer: Self-pay | Admitting: Medical

## 2019-11-04 ENCOUNTER — Ambulatory Visit (HOSPITAL_BASED_OUTPATIENT_CLINIC_OR_DEPARTMENT_OTHER)
Admission: RE | Admit: 2019-11-04 | Discharge: 2019-11-04 | Disposition: A | Discharge status: Home / Self Care | Payer: Medicare HMO | Source: Ambulatory Visit | Attending: Medical | Admitting: Medical

## 2019-11-04 ENCOUNTER — Other Ambulatory Visit: Payer: Self-pay

## 2019-11-04 DIAGNOSIS — I5033 Acute on chronic diastolic (congestive) heart failure: Secondary | ICD-10-CM

## 2019-11-04 MED ORDER — PERFLUTREN LIPID MICROSPHERE
1.0000 mL | INTRAVENOUS | Status: AC | PRN
  Administered 2019-11-04: 2 mL via INTRAVENOUS
  Filled 2019-11-04: qty 10

## 2019-11-04 NOTE — Progress Notes (Signed)
  Echocardiogram 2D Echocardiogram has been performed.  Steven Barnett 11/04/2019, 12:17 PM

## 2019-11-06 ENCOUNTER — Other Ambulatory Visit: Payer: Self-pay | Admitting: Internal Medicine

## 2019-11-06 NOTE — Telephone Encounter (Signed)
*  STAT* If patient is at the pharmacy, call can be transferred to refill team.   1. Which medications need to be refilled? (please list name of each medication and dose if known) warfarin (COUMADIN) 4 MG tablet  2. Which pharmacy/location (including street and city if local pharmacy) is medication to be sent to? [facility] - [place], [place] - [address]  3. Do they need a 30 day or 90 day supply? 30 day supply

## 2019-11-07 ENCOUNTER — Other Ambulatory Visit: Payer: Self-pay | Admitting: Internal Medicine

## 2019-11-07 MED ORDER — WARFARIN SODIUM 4 MG PO TABS: ORAL_TABLET | ORAL | 0 refills | Status: DC

## 2019-11-07 NOTE — Telephone Encounter (Signed)
New message   *STAT* If patient is at the pharmacy, call can be transferred to refill team.   1. Which medications need to be refilled? (please list name of each medication and dose if known) warfarin (COUMADIN) 4 MG tablet  2. Which pharmacy/location (including street and city if local pharmacy) is medication to be sent to? [facility] - [place], [place] - [address]  3. Do they need a 30 day or 90 day supply? 90 day

## 2019-11-13 ENCOUNTER — Other Ambulatory Visit: Payer: Self-pay

## 2019-11-13 ENCOUNTER — Ambulatory Visit (INDEPENDENT_AMBULATORY_CARE_PROVIDER_SITE_OTHER): Payer: Medicare HMO | Admitting: Medical

## 2019-11-13 ENCOUNTER — Encounter: Payer: Self-pay | Admitting: Medical

## 2019-11-13 ENCOUNTER — Ambulatory Visit (INDEPENDENT_AMBULATORY_CARE_PROVIDER_SITE_OTHER): Payer: Medicare HMO | Admitting: Pharmacist

## 2019-11-13 VITALS — BP 142/92 | HR 85 | Temp 97.2°F | Ht 70.0 in | Wt 289.0 lb

## 2019-11-13 DIAGNOSIS — I10 Essential (primary) hypertension: Secondary | ICD-10-CM

## 2019-11-13 DIAGNOSIS — G4733 Obstructive sleep apnea (adult) (pediatric): Secondary | ICD-10-CM

## 2019-11-13 DIAGNOSIS — I5033 Acute on chronic diastolic (congestive) heart failure: Secondary | ICD-10-CM

## 2019-11-13 DIAGNOSIS — Z5181 Encounter for therapeutic drug level monitoring: Secondary | ICD-10-CM

## 2019-11-13 DIAGNOSIS — I4821 Permanent atrial fibrillation: Secondary | ICD-10-CM

## 2019-11-13 DIAGNOSIS — Z79899 Other long term (current) drug therapy: Secondary | ICD-10-CM

## 2019-11-13 LAB — POCT INR: INR: 2.9 (ref 2.0–3.0)

## 2019-11-13 MED ORDER — CARVEDILOL 12.5 MG PO TABS: 12.5000 mg | ORAL_TABLET | Freq: Two times a day (BID) | ORAL | 3 refills | Status: DC

## 2019-11-13 NOTE — Progress Notes (Signed)
Cardiology Office Note   Date:  11/13/2019   ID:  [name], Steven Barnett 1950-11-06, MRN [id number]  PCP:  Steven Cara, PA-C  Cardiologist:  Steven Casino, MD EP: None  Chief Complaint  Patient presents with  . Follow-up    CHF      History of Present Illness: Steven Barnett is a 69 y.o. male with a PMH of chronic diastolic CHF, permanent atrial fibrillation, HTN, and OSA intolerant to CPAP, who presents for follow-up of of his CHF.  He was last evaluated by cardiology at an outpatient visit with myself 10/29/19, at which time he had complaints of SOB, DOE, bendopnea, and orthopnea. He was started on metolazone 5mg  twice a week in addition to his lasix. Additionally his lisinopril was transitioned to valsartan for improved BP control. He had an echocardiogram 11/04/19 which showed EF 60-65%, moderate LVH, indeterminate LV diastolic function, no RWMA, moderate LAE, and no significant valvular abnormalities.   No significant change in symptoms since his last visit despite addition of metoprolol 5mg  twice a week. He has been on lasix 40mg  BID, though was on 80mg  BID prior to his last visit. Weight is up 2lbs. Still having orthopnea and bendopnea. He has had some mild ankle edema since last visit. No chest pain complaints, palpitations, dizziness, lightheadedness, or syncope.     Past Medical History:  Diagnosis Date  . Anemia   . Arthralgia of both knees   . Arthritis    "right knee" (10/01/2015)  . Atrial fibrillation ([place])   . Cancer (HCC)    STAGE 1 COLON CANCER  . Chronic anticoagulation 2010   Coumadin  . Chronic diastolic CHF (congestive heart failure), NYHA class 2 ([place])   . Dyspnea   . Dysrhythmia   . Hypertension   . OSA (obstructive sleep apnea) 2016   "couldn't take the mask during the testing" (10/01/2015)  . Permanent atrial fibrillation (Steven Barnett) 2010  . Pneumonia 3/29/2017and june 2017    Past Surgical History:  Procedure Laterality Date  . COLONOSCOPY  WITH PROPOFOL N/A 03/04/2016   Procedure: COLONOSCOPY WITH PROPOFOL;  Surgeon: Carol Ada, MD;  Location: [facility];  Service: Endoscopy;  Laterality: N/A;  . COLONOSCOPY WITH PROPOFOL N/A 09/15/2017   Procedure: COLONOSCOPY WITH PROPOFOL;  Surgeon: Carol Ada, MD;  Location: [facility];  Service: Endoscopy;  Laterality: N/A;  . LAPAROSCOPIC PARTIAL COLECTOMY N/A 04/12/2016   Procedure: LAPAROSCOPIC ILEOCOLECTOMY;  Surgeon: [name], MD;  Location: [facility];  Service: General;  Laterality: N/A;  . PORT-A-CATH REMOVAL N/A 02/16/2017   Procedure: REMOVAL PORT-A-CATH;  Surgeon: [name], MD;  Location: [facility];  Service: General;  Laterality: N/A;  . PORTACATH PLACEMENT N/A 05/18/2016   Procedure: INSERTION PORT-A-CATH;  Surgeon: [name], MD;  Location: [facility];  Service: General;  Laterality: N/A;  . THORACOTOMY Right 2010     Current Outpatient Medications  Medication Sig Dispense Refill  . acetaminophen (TYLENOL) 500 MG tablet Take 1,000 mg by mouth 2 (two) times daily.    Marland Kitchen albuterol (PROVENTIL HFA;VENTOLIN HFA) 108 (90 Base) MCG/ACT inhaler Inhale 2 puffs into the lungs every 6 (six) hours as needed for wheezing or shortness of breath. 1 Inhaler 2  . aspirin EC 81 MG tablet Take 81 mg by mouth daily.    . colchicine 0.6 MG tablet Take 0.6 mg by mouth 2 (two) times daily.    Marland Kitchen diltiazem (CARDIZEM) 120 MG tablet TAKE 1 TABLET BY MOUTH  ONCE DAILY 30 tablet 6  . furosemide (LASIX) 80 MG tablet Take 0.5 tablets (40 mg total) by mouth 2 (two) times daily. 180 tablet 0  . hydrALAZINE (APRESOLINE) 100 MG tablet     . metolazone (ZAROXOLYN) 5 MG tablet Take 1 tablet 2 times a week on Wednesdays and Saturdays.    Marland Kitchen MITIGARE 0.6 MG CAPS     . potassium chloride SA (KLOR-CON) 20 MEQ tablet TAKE 1  BY MOUTH ONCE DAILY 30 tablet 0  . valsartan (DIOVAN) 320 MG tablet TAKE 1 TABLET BY MOUTH ONCE DAILY    . warfarin (COUMADIN) 4 MG tablet Take 1 to 2 Tablet daily at  suppertime as Directed by the [facility] 36 tablet 0  . carvedilol (COREG) 12.5 MG tablet Take 1 tablet (12.5 mg total) by mouth 2 (two) times daily. 180 tablet 3   No current facility-administered medications for this visit.   Facility-Administered Medications Ordered in Other Visits  Medication Dose Route Frequency Provider Last Rate Last Admin  . heparin lock flush 100 unit/mL  500 Units Intravenous Once PRN [name], MD      . sodium chloride flush (NS) 0.9 % injection 10 mL  10 mL Intravenous PRN [name], MD        Allergies:   Patient has no known allergies.    Social History:  The patient  reports that he quit smoking about 44 years ago. His smoking use included cigarettes. He has a 0.20 pack-year smoking history. He has never used smokeless tobacco. He reports that he does not drink alcohol or use drugs.   Family History:  The patient's family history includes Bone cancer in his maternal uncle; Cancer in his maternal uncle; Colon cancer in his sister and sister; Colon cancer (age of onset: 59) in his sister; Colon polyps in his brother, father, and mother; Dementia in his mother; Diabetes in his mother; Heart disease in his father; Heart failure in his father; Hyperlipidemia in his mother; Hypertension in his mother; Lung cancer in his paternal grandfather; Stroke in his maternal grandmother and sister.    ROS:  Please see the history of present illness.   Otherwise, review of systems are positive for none.   All other systems are reviewed and negative.    PHYSICAL EXAM: VS:  BP (!) 142/92   Pulse 85   Temp (!) 97.2 F (36.2 C)   Ht 5\' 10"  (1.778 m)   Wt 289 lb (131.1 kg)   SpO2 97%   BMI 41.47 kg/m  , BMI Body mass index is 41.47 kg/m. GEN: Well nourished, well developed, in no acute distress HEENT: normal Neck: no JVD, carotid bruits, or masses Cardiac: IRIR; no murmurs, rubs, or gallops, trace LE edema  Respiratory:  clear to auscultation bilaterally, normal  work of breathing GI: soft, obese, nontender, nondistended, + BS MS: no deformity or atrophy Skin: warm and dry, no rash Neuro:  Strength and sensation are intact Psych: euthymic mood, full affect   EKG:  EKG is not ordered today.   Recent Labs: 08/02/2019: ALT 13; BUN 17; Creatinine, Ser 1.00; Hemoglobin 16.0; Platelets 180; Potassium 3.8; Sodium 139    Lipid Panel    Component Value Date/Time   CHOL  11/20/2008 0420    93        ATP III CLASSIFICATION:  <200     mg/dL   Desirable  200-239  mg/dL   Borderline High  >=240    mg/dL  High          TRIG 31 11/20/2008 0420   HDL 42 11/20/2008 0420   CHOLHDL 2.2 11/20/2008 0420   VLDL 6 11/20/2008 0420   LDLCALC  11/20/2008 0420    45        Total Cholesterol/HDL:CHD Risk Coronary Heart Disease Risk Table                     Men   Women  1/2 Average Risk   3.4   3.3  Average Risk       5.0   4.4  2 X Average Risk   9.6   7.1  3 X Average Risk  23.4   11.0        Use the calculated Patient Ratio above and the CHD Risk Table to determine the patient's CHD Risk.        ATP III CLASSIFICATION (LDL):  <100     mg/dL   Optimal  100-129  mg/dL   Near or Above                    Optimal  130-159  mg/dL   Borderline  160-189  mg/dL   High  >190     mg/dL   Very High      Wt Readings from Last 3 Encounters:  11/13/19 289 lb (131.1 kg)  10/29/19 287 lb (130.2 kg)  08/02/19 288 lb (130.6 kg)      Other studies Reviewed: Additional studies/ records that were reviewed today include:   Echocardiogram 11/04/19: 1. Left ventricular ejection fraction, by estimation, is 60 to 65%. The  left ventricle has normal function. The left ventricle has no regional  wall motion abnormalities. There is moderate left ventricular hypertrophy.  Left ventricular diastolic  parameters are indeterminate.  2. Right ventricular systolic function is normal. The right ventricular  size is normal. There is mildly elevated pulmonary artery  systolic  pressure.  3. Left atrial size was moderately dilated.  4. The mitral valve is normal in structure. No evidence of mitral valve  regurgitation. No evidence of mitral stenosis.  5. The aortic valve is normal in structure. Aortic valve regurgitation is  not visualized. No aortic stenosis is present.  6. The inferior vena cava is normal in size with greater than 50%  respiratory variability, suggesting right atrial pressure of 3 mmHg.   NST 2016:  This is a low risk study.  Non-gated due to atrial fibrillation  No reversible perfusion defects  Low risk study. No reversible ischemia. Non-gated due to atrial fibrillation.   ASSESSMENT AND PLAN:  1. Acute on chronic diastolic CHF: patient has had persistent SOB over the past several months with no significant change despite addition of metolazone twice weekly, though his lasix was decreased to 40mg  BID at that time.  - Will increase lasix to 80mg  BID - Continue metolazone twice weekly - Continue BBlocker - Plan to repeat BMET today and in 2 weeks - Encouraged daily weights and low salt diet.   2. Permanent atrial fibrillation: rate well controlled today. No complaints of palpitation. INR check with pharmacy today - 2.3. No complaints of bleeding - Continue metoprolol and diltiazem for rate control - Continue coumadin for stroke ppx  3. HTN: BP 142/92 today; goal <13/80 - Will transition from metoprolol to carvedilol 12.5mg  BID for improved BP control - Continue valsartan diltiazem, hydralazine, and lasix  4. OSA: intolerant to CPAP. He has  insomnia issues.  - Suggested OTC melatonin at bedtime to help get his sleep on track.     Current medicines are reviewed at length with the patient today.  The patient does not have concerns regarding medicines.  The following changes have been made:  As above  Labs/ tests ordered today include:   Orders Placed This Encounter  Procedures  . Basic metabolic panel  .  Basic metabolic panel     Disposition:   FU with Dr. [name] in 2-3 months  Signed, Abigail Butts, PA-C  11/13/2019 5:06 PM

## 2019-11-13 NOTE — Patient Instructions (Addendum)
Medication Instructions:   INCREASE Lasix to 80 mg 2 times a day  START CARVEDILOL (COREG) 12.5 MG 2 TIMES A DAY  STOP METOPROLOL TARTRATE (LOPRESSOR) *If you need a refill on your cardiac medications before your next appointment, please call your pharmacy*  Lab Work: Your physician recommends that you return for lab work in [place]:   BMET  Your physician recommends that you return for lab work in [place] 11/27/2019:   BMET  If you have labs (blood work) drawn today and your tests are completely normal, you will receive your results only by: Marland Kitchen MyChart Message (if you have MyChart) OR . A paper copy in the mail If you have any lab test that is abnormal or we need to change your treatment, we will call you to review the results.  Testing/Procedures: NONE ordered at this time of appointment   Follow-Up: At [facility], you and your health needs are our priority.  As part of our continuing mission to provide you with exceptional heart care, we have created designated Provider Care Teams.  These Care Teams include your primary Cardiologist (physician) and Advanced Practice Providers (APPs -  Physician Assistants and Nurse Practitioners) who all work together to provide you with the care you need, when you need it.   Your next appointment:   2-3 month(s)  The format for your next appointment:   In Person  Provider:   K. Mali Hilty, MD  Other Instructions  LIMIT salt intake to 2 grams (2000 mg) or less per day  Continue to monitor your weight at home.

## 2019-11-14 ENCOUNTER — Other Ambulatory Visit: Payer: Self-pay

## 2019-11-14 ENCOUNTER — Telehealth: Payer: Self-pay | Admitting: Medical

## 2019-11-14 LAB — BASIC METABOLIC PANEL
BUN/Creatinine Ratio: 17 (ref 10–24)
BUN: 19 mg/dL (ref 8–27)
CO2: 28 mmol/L (ref 20–29)
Calcium: 9.4 mg/dL (ref 8.6–10.2)
Chloride: 98 mmol/L (ref 96–106)
Creatinine, Ser: 1.11 mg/dL (ref 0.76–1.27)
GFR calc Af Amer: 78 mL/min/{1.73_m2} (ref >59)
GFR calc non Af Amer: 68 mL/min/{1.73_m2} (ref >59)
Glucose: 84 mg/dL (ref 65–99)
Potassium: 4.5 mmol/L (ref 3.5–5.2)
Sodium: 139 mmol/L (ref 134–144)

## 2019-11-14 MED ORDER — FUROSEMIDE 80 MG PO TABS: 80.0000 mg | ORAL_TABLET | Freq: Two times a day (BID) | ORAL | 3 refills | Status: DC

## 2019-11-14 MED ORDER — POTASSIUM CHLORIDE CRYS ER 20 MEQ PO TBCR: EXTENDED_RELEASE_TABLET | ORAL | 3 refills | Status: DC

## 2019-11-14 MED ORDER — WARFARIN SODIUM 4 MG PO TABS: ORAL_TABLET | ORAL | 0 refills | Status: DC

## 2019-11-14 NOTE — Telephone Encounter (Signed)
Patient called and needed refills on medications

## 2019-11-14 NOTE — Telephone Encounter (Signed)
    Pt c/o medication issue:  1. Name of Medication:   carvedilol (COREG) 12.5 MG tablet    MITIGARE 0.6 MG CAPS    2. How are you currently taking this medication (dosage and times per day)?   3. Are you having a reaction (difficulty breathing--STAT)?   4. What is your medication issue? Curt Bears from [place] calling, would like to get clarification between this 2 meds. She since the carvedilol can effect the mitigare   Please adavise

## 2019-11-14 NOTE — Telephone Encounter (Signed)
Attempted to call [facility] back but held for 21 minutes.. had to hang up will try back later this afternoon.

## 2019-11-15 MED ORDER — METOPROLOL TARTRATE 25 MG PO TABS
25.0000 mg | ORAL_TABLET | Freq: Two times a day (BID) | ORAL | 3 refills | Status: DC
Start: 2019-11-15 — End: 2020-09-15

## 2019-11-15 NOTE — Telephone Encounter (Signed)
Pt and [facility] advised the new RX and to D/C the Coreg.

## 2019-11-15 NOTE — Telephone Encounter (Signed)
[facility] called to report that there is a drug interaction between colchicine and coreg.. it can potentially make the coreg more potent in the pts bloodstream.. I asked the pharmacist to also reach out to he PCP.Marland Kitchen Vedia Coffer PA with [facility] and he has noted to counsel the pt not to take the meds together.   Will forward to [employer] PA and our [facility] for advice and recommendation.

## 2019-11-15 NOTE — Telephone Encounter (Signed)
Switch patient back to metoprolol tartrate 25 mg bid.

## 2019-12-23 ENCOUNTER — Other Ambulatory Visit: Payer: Self-pay

## 2019-12-23 ENCOUNTER — Ambulatory Visit (INDEPENDENT_AMBULATORY_CARE_PROVIDER_SITE_OTHER): Payer: Medicare HMO | Admitting: Pharmacist Clinician (PhC)/ Clinical Pharmacy Specialist

## 2019-12-23 DIAGNOSIS — I4821 Permanent atrial fibrillation: Secondary | ICD-10-CM | POA: Diagnosis not present

## 2019-12-23 DIAGNOSIS — Z5181 Encounter for therapeutic drug level monitoring: Secondary | ICD-10-CM | POA: Diagnosis not present

## 2019-12-23 LAB — POCT INR: INR: 2.4 (ref 2.0–3.0)

## 2020-01-04 ENCOUNTER — Other Ambulatory Visit: Payer: Self-pay | Admitting: Internal Medicine

## 2020-01-07 ENCOUNTER — Other Ambulatory Visit: Payer: Self-pay | Admitting: Pharmacist Clinician (PhC)/ Clinical Pharmacy Specialist

## 2020-01-07 MED ORDER — WARFARIN SODIUM 4 MG PO TABS: ORAL_TABLET | ORAL | 1 refills | Status: DC

## 2020-02-26 ENCOUNTER — Ambulatory Visit (INDEPENDENT_AMBULATORY_CARE_PROVIDER_SITE_OTHER): Payer: Medicare HMO | Admitting: Pharmacist

## 2020-02-26 ENCOUNTER — Ambulatory Visit (INDEPENDENT_AMBULATORY_CARE_PROVIDER_SITE_OTHER): Payer: Medicare HMO | Admitting: Internal Medicine

## 2020-02-26 ENCOUNTER — Encounter: Payer: Self-pay | Admitting: Internal Medicine

## 2020-02-26 ENCOUNTER — Other Ambulatory Visit: Payer: Self-pay

## 2020-02-26 VITALS — BP 118/80 | HR 73 | Temp 97.5°F | Ht 70.0 in | Wt 287.0 lb

## 2020-02-26 DIAGNOSIS — Z5181 Encounter for therapeutic drug level monitoring: Secondary | ICD-10-CM

## 2020-02-26 DIAGNOSIS — Z7901 Long term (current) use of anticoagulants: Secondary | ICD-10-CM

## 2020-02-26 DIAGNOSIS — I4821 Permanent atrial fibrillation: Secondary | ICD-10-CM

## 2020-02-26 DIAGNOSIS — I5032 Chronic diastolic (congestive) heart failure: Secondary | ICD-10-CM

## 2020-02-26 LAB — POCT INR: INR: 3.9 — AB (ref 2.0–3.0)

## 2020-02-26 NOTE — Patient Instructions (Signed)
HOLD warfarin dose today ONLY, then continue with 2 tables daily. INR in 2 weeks. Call us with medication changes or concerns [phone].

## 2020-02-26 NOTE — Patient Instructions (Signed)

## 2020-02-26 NOTE — Progress Notes (Signed)
Marland Kitchen    OFFICE NOTE  Chief Complaint:  Arthritis  Primary Care Physician: Elisabeth Cara, PA-C  HPI:  Steven Barnett is a 69 year old morbidly obese African American male with a history significant for hypertension, sleep disorder consistent with obstructive sleep apnea, recent diagnosis of diastolic heart failure with moderate LVH and recent diagnosis of postoperative atrial fibrillation (continues to be in atrial fibrillation) presenting with atypical chest pain. The patient was hospitalized in May 2010 and diagnosed with a right-sided empyema, for which he underwent a thoracotomy. He was found to be in atrial fibrillation postoperatively and has been in this rhythm ever since. At that time, he was also diagnosed with diastolic heart failure and found to have moderate left ventricular hypertrophy by echocardiogram.   Unfortunately, he was incarcerated for the past several years and has not been followed by cardiologist. His warfarin has been continued while in jail however was not adjusted regularly. He recently saw a new primary care provider with [facility] and he is here to reestablish cardiac care and followup of his warfarin. He remains in atrial fibrillation today. He reports shortness of breath which is unchanged.  He denies any chest pain. He does report some leg swelling. He has problems with his knees from arthritis and pain. He also has not been able to lose weight. Finally, he has a history of sleep apnea but was never treated for this. He reports poor sleep at night and daytime fatigue and sometimes feels like he doesn't want to fall asleep because he feels like he is going to stop breathing. There is witnessed apnea.  I saw Steven Barnett in the office today. His main complaints are with erectile dysfunction. He is interested in Viagra. Denies any chest pain and reports good sleep. He is therapeutic on warfarin and is in permanent atrial fibrillation.  Steven Barnett returns  to the office today. He is inquiring about getting generic sildenafil as Viagra was not covered. He also wants to be referred for colonoscopy. I told him that would be up to his primary care provider but he said "I thought you were my primary care provider". I clarified the differences in of encouraged him to follow-up with his primary care provider with [facility]. We will go ahead and refer him for screening colonoscopy. He was advised that he need to hold warfarin for least 5 days prior to colonoscopy.  I saw Steven Barnett back in the office today. He was seen this summer by Tarri Fuller, PA-C, for evaluation of cough and shortness of breath. A chest x-ray demonstrated possible pneumonia and was treated with doxycycline. He's not sure if this actually improved his symptoms or not. He recently has been absent for monthly warfarin checks. He says that he was not notified by the office however monthly appointments are scheduled and call also been made. He would like to arrange for a set day for him to come and get his warfarin checked.  Steven Barnett returns today for follow-up. He was recently hospitalized with acute on chronic diastolic heart failure. He had significant weight gain and edema with shortness of breath. He was diuresed and discharged in much better condition. He follows up today in the office. He says that he has not been entirely compliant with his medications, due to some confusion. His blood pressure today is 150/90 with a heart rate of 90. He says he's been taking 25 mg or 1 tablet of Lopressor twice daily and set of  2 tablets twice daily which was recommended. Weight on discharge was 264 and today is 273 although he measured to 69 at home. He denies any worsening swelling or shortness of breath.  Steven Barnett returns again today for hospital follow-up. He was last seen by myself on March 1 and that was for hospital follow-up. He is now seen again for another hospital follow-up. Unfortunately he  redeveloped a pneumonia and had acute on chronic congestive heart failure. He had significant diuresis and treated with antibiotics and steroids. He was discharged and reports his breathing has improved. He is now on 80 mg Lasix twice a day. He had confusing directions on prednisone. He has 60 mg tablets but his medication list indicated 10 mg tablets. He was advised to taper down, but he did not how to do that with the 60 mg tablets. Currently he is taking 20 mg 3 times a day. Weight is actually 2 pounds lower than discharge.  12/03/2015  Steven Barnett was seen back today in the office for follow-up. His heart rate was elevated today 124 and he reports he ran out of his metoprolol despite there being an active order in the pharmacy. We checked on that today. He apparently is taking diltiazem 90 mg daily. His INR was also recheck today and is subtherapeutic at 1.4. This is concerning for ongoing medication noncompliance. Spiked the increased heart rate he denies any worsening shortness of breath or chest pain.  04/06/2016  Steven Barnett was seen in the office today for follow-up. He is doing much better now that we've reinstituted his medicines. Blood pressure is well-controlled at 118/82 today. Heart rate is in well-controlled at 64 and he is in atrial fibrillation. Warfarin was assessed as well with an INR checked today. Unfortunately he recently was found to have a colorectal cancer by Dr. [name]. He is scheduled for a laparoscopic ileocolectomy by Dr. [name] on 04/12/2016.   04/24/2018  Steven Barnett returns today for follow-up.  Is been 2 years since I last saw him.  He is been followed in the Coumadin clinic and INR was therapeutic today.  He had undergone surgery for colon cancer in 2017 with adjuvant chemotherapy.  Follow-up afterwards has been reassuring with low CEAs and no evidence of recurrence.  He denies chest pain or worsening shortness of breath.  He has had no heart failure hospitalizations.  His main  complaint is daytime fatigue and sleepiness.  He says he has trouble falling asleep but generally uses Xanax that is not his prescription.  He does have a history of obstructive sleep apnea but was apparently intolerant of the mask.  The study was performed years ago and we discussed the possibility of repeating the study today based on the newer and more tolerable equipment available.  02/26/2020  Steven Barnett is seen today in follow-up.  He has been seen in the interim twice by Roby Lofts, PA-C for evaluation of shortness of breath.  He was also complaining of bendopnea/orthopnea -he has significant abdominal obesity.  His diuretics were increased due to some lower extremity swelling and is currently on a Lasix 80 mg twice daily with metolazone twice a week.  This seems to be controlling his edema and his weight is down a few pounds.  Blood pressure is also well controlled at 118/80 today with recent medication adjustments.  He remains in A. fib which is longstanding persistent and is anticoagulated on warfarin.  INR was elevated today 3.9 and will be adjusted by  our anticoagulation pharmacist.  PMHx:  Past Medical History:  Diagnosis Date  . Anemia   . Arthralgia of both knees   . Arthritis    "right knee" (10/01/2015)  . Atrial fibrillation ([place])   . Cancer (HCC)    STAGE 1 COLON CANCER  . Chronic anticoagulation 2010   Coumadin  . Chronic diastolic CHF (congestive heart failure), NYHA class 2 ([place])   . Dyspnea   . Dysrhythmia   . Hypertension   . OSA (obstructive sleep apnea) 2016   "couldn't take the mask during the testing" (10/01/2015)  . Permanent atrial fibrillation ([place]) 2010  . Pneumonia 3/29/2017and june 2017    Past Surgical History:  Procedure Laterality Date  . COLONOSCOPY WITH PROPOFOL N/A 03/04/2016   Procedure: COLONOSCOPY WITH PROPOFOL;  Surgeon: Carol Ada, MD;  Location: [facility];  Service: Endoscopy;  Laterality: N/A;  . COLONOSCOPY WITH PROPOFOL N/A 09/15/2017    Procedure: COLONOSCOPY WITH PROPOFOL;  Surgeon: Carol Ada, MD;  Location: [facility];  Service: Endoscopy;  Laterality: N/A;  . LAPAROSCOPIC PARTIAL COLECTOMY N/A 04/12/2016   Procedure: LAPAROSCOPIC ILEOCOLECTOMY;  Surgeon: [name], MD;  Location: [facility];  Service: General;  Laterality: N/A;  . PORT-A-CATH REMOVAL N/A 02/16/2017   Procedure: REMOVAL PORT-A-CATH;  Surgeon: [name], MD;  Location: [place];  Service: General;  Laterality: N/A;  . PORTACATH PLACEMENT N/A 05/18/2016   Procedure: INSERTION PORT-A-CATH;  Surgeon: [name], MD;  Location: [place];  Service: General;  Laterality: N/A;  . THORACOTOMY Right 2010    FAMHx:  Family History  Problem Relation Age of Onset  . Heart disease Father   . Heart failure Father   . Colon polyps Father        unspecified number - pt of intestine surgically resected  . Hypertension Mother   . Diabetes Mother   . Hyperlipidemia Mother   . Colon polyps Mother        unspecified number - pt of intestine surgically resected  . Dementia Mother        d. 47  . Stroke Maternal Grandmother   . Stroke Sister   . Colon cancer Sister 54       w/ "cancerous polyps" - unspecified number; underwent surgery and radiation  . Colon polyps Brother        unspecified number - polypectomies  . Colon cancer Sister        dx 35-60; s/p surgery and radiation  . Bone cancer Maternal Uncle        dx. 59s  . Lung cancer Paternal Grandfather        d. 80s-90s; lung cancer vs TB  . Colon cancer Sister        dx. 58-60; s/p surgery and radiation  . Cancer Maternal Uncle        mother had about 7-8 other siblings, most passed at older ages, many of whom had some type of cancer  . Heart attack Neg Hx     SOCHx:   reports that he quit smoking about 45 years ago. His smoking use included cigarettes. He has a 0.20 pack-year smoking history. He has never used smokeless tobacco. He reports that he does not drink alcohol and  does not use drugs.  ALLERGIES:  No Known Allergies  ROS: Pertinent items noted in HPI and remainder of comprehensive ROS otherwise negative.  HOME MEDS: Current Outpatient Medications  Medication Sig Dispense Refill  . acetaminophen (TYLENOL) 500 MG tablet  Take 1,000 mg by mouth 2 (two) times daily.    Marland Kitchen albuterol (PROVENTIL HFA;VENTOLIN HFA) 108 (90 Base) MCG/ACT inhaler Inhale 2 puffs into the lungs every 6 (six) hours as needed for wheezing or shortness of breath. 1 Inhaler 2  . aspirin EC 81 MG tablet Take 81 mg by mouth daily.    . colchicine 0.6 MG tablet Take 0.6 mg by mouth 2 (two) times daily.    Marland Kitchen diltiazem (CARDIZEM) 120 MG tablet TAKE 1 TABLET BY MOUTH ONCE DAILY 30 tablet 6  . furosemide (LASIX) 80 MG tablet Take 1 tablet (80 mg total) by mouth 2 (two) times daily. 180 tablet 3  . hydrALAZINE (APRESOLINE) 100 MG tablet     . metolazone (ZAROXOLYN) 5 MG tablet Take 1 tablet 2 times a week on Wednesdays and Saturdays.    . metoprolol tartrate (LOPRESSOR) 25 MG tablet Take 1 tablet (25 mg total) by mouth 2 (two) times daily. 180 tablet 3  . MITIGARE 0.6 MG CAPS     . potassium chloride SA (KLOR-CON) 20 MEQ tablet TAKE 1  BY MOUTH ONCE DAILY 90 tablet 3  . valsartan (DIOVAN) 320 MG tablet TAKE 1 TABLET BY MOUTH ONCE DAILY    . warfarin (COUMADIN) 4 MG tablet TAKE 2 TABLETS BY MOUTH ONCE DAILY AS DIRECTED BY  COUMADIN  CLINIC 60 tablet 1   No current facility-administered medications for this visit.   Facility-Administered Medications Ordered in Other Visits  Medication Dose Route Frequency Provider Last Rate Last Admin  . heparin lock flush 100 unit/mL  500 Units Intravenous Once PRN [name], MD      . sodium chloride flush (NS) 0.9 % injection 10 mL  10 mL Intravenous PRN [name], MD        LABS/IMAGING: No results found for this or any previous visit (from the past 48 hour(s)). No results found.  VITALS: BP 118/80   Pulse 73   Temp (!) 97.5 F (36.4 C)   Ht  5\' 10"  (1.778 m)   Wt 287 lb (130.2 kg)   SpO2 95%   BMI 41.18 kg/m   EXAM: General appearance: alert, no distress and morbidly obese Neck: no carotid bruit and no JVD Lungs: clear to auscultation bilaterally Heart: irregularly irregular rhythm Abdomen: soft, non-tender; bowel sounds normal; no masses,  no organomegaly Extremities: edema trace pitting edema Pulses: 2+ and symmetric Skin: Skin color, texture, turgor normal. No rashes or lesions Neurologic: Grossly normal Psych: Pleasant  EKG: A. fib at 73-personally reviewed  ASSESSMENT: 1. Chronic diastolic heart failure 2. Permanent atrial fibrillation - anticoagulated on warfarin, CHADSVASC score of 3 3. Morbid obesity 4. Obstructive sleep apnea-not on CPAP 5. Hypertension controlled 6. Leg edema 7. Bilateral knee osteoarthritis 8. Erectile dysfunction 9. Recent pneumonia 10. H/o medication noncompliance  PLAN: 1.   Mr. Waddill has had some recent worsening of shortness of breath thought to acute on chronic diastolic heart failure.  His Lasix was increased to 80 mg twice a day with metolazone twice a week.  Weight is stable if not down a few pounds.  Blood pressure is much better controlled today.  INR was elevated and his warfarin will be adjusted.  No other medication changes were made today.  Follow-up with me in 6 months or sooner as necessary.  Pixie Casino, MD, [facility], [name] Director of the [facility]  [facility] Diplomate of the  American Board of Clinical Lipidology Attending Cardiologist  Direct Dial: [phone]  Fax: [phone]  Website:  [email or web address] 02/26/2020, 11:29 AM

## 2020-03-12 ENCOUNTER — Other Ambulatory Visit: Payer: Self-pay

## 2020-03-12 ENCOUNTER — Ambulatory Visit (INDEPENDENT_AMBULATORY_CARE_PROVIDER_SITE_OTHER): Payer: Medicare HMO

## 2020-03-12 DIAGNOSIS — Z5181 Encounter for therapeutic drug level monitoring: Secondary | ICD-10-CM

## 2020-03-12 DIAGNOSIS — I4821 Permanent atrial fibrillation: Secondary | ICD-10-CM | POA: Diagnosis not present

## 2020-03-12 LAB — POCT INR: INR: 2 (ref 2.0–3.0)

## 2020-03-12 NOTE — Patient Instructions (Signed)
continue with 2 tables daily. INR in 4 weeks. Call us with medication changes or concerns [phone].

## 2020-03-31 ENCOUNTER — Other Ambulatory Visit: Payer: Self-pay | Admitting: Medical

## 2020-03-31 NOTE — Telephone Encounter (Signed)
This is Dr. Hilty's pt 

## 2020-04-01 ENCOUNTER — Other Ambulatory Visit: Payer: Self-pay

## 2020-04-01 ENCOUNTER — Telehealth: Payer: Self-pay | Admitting: Hematology

## 2020-04-01 ENCOUNTER — Inpatient Hospital Stay: Payer: Medicare HMO | Attending: Hematology | Admitting: Hematology

## 2020-04-01 ENCOUNTER — Inpatient Hospital Stay: Payer: Medicare HMO

## 2020-04-01 ENCOUNTER — Encounter: Payer: Self-pay | Admitting: Hematology

## 2020-04-01 VITALS — BP 95/65 | HR 83 | Temp 97.8°F | Resp 18 | Ht 70.0 in | Wt 284.5 lb

## 2020-04-01 DIAGNOSIS — C182 Malignant neoplasm of ascending colon: Secondary | ICD-10-CM

## 2020-04-01 DIAGNOSIS — I110 Hypertensive heart disease with heart failure: Secondary | ICD-10-CM | POA: Diagnosis not present

## 2020-04-01 DIAGNOSIS — I509 Heart failure, unspecified: Secondary | ICD-10-CM | POA: Insufficient documentation

## 2020-04-01 DIAGNOSIS — C180 Malignant neoplasm of cecum: Secondary | ICD-10-CM

## 2020-04-01 LAB — CBC WITH DIFFERENTIAL/PLATELET
Abs Immature Granulocytes: 0.03 10*3/uL (ref 0.00–0.07)
Basophils Absolute: 0.1 10*3/uL (ref 0.0–0.1)
Basophils Relative: 1 %
Eosinophils Absolute: 0.1 10*3/uL (ref 0.0–0.5)
Eosinophils Relative: 2 %
HCT: 47 % (ref 39.0–52.0)
Hemoglobin: 15.3 g/dL (ref 13.0–17.0)
Immature Granulocytes: 1 %
Lymphocytes Relative: 19 %
Lymphs Abs: 1.1 10*3/uL (ref 0.7–4.0)
MCH: 27.2 pg (ref 26.0–34.0)
MCHC: 32.6 g/dL (ref 30.0–36.0)
MCV: 83.6 fL (ref 80.0–100.0)
Monocytes Absolute: 0.8 10*3/uL (ref 0.1–1.0)
Monocytes Relative: 13 %
Neutro Abs: 3.8 10*3/uL (ref 1.7–7.7)
Neutrophils Relative %: 64 %
Platelets: 156 10*3/uL (ref 150–400)
RBC: 5.62 MIL/uL (ref 4.22–5.81)
RDW: 15.2 % (ref 11.5–15.5)
WBC: 5.9 10*3/uL (ref 4.0–10.5)
nRBC: 0 % (ref 0.0–0.2)

## 2020-04-01 LAB — COMPREHENSIVE METABOLIC PANEL
ALT: 16 U/L (ref 0–44)
AST: 19 U/L (ref 15–41)
Albumin: 3.9 g/dL (ref 3.5–5.0)
Alkaline Phosphatase: 97 U/L (ref 38–126)
Anion gap: 14 (ref 5–15)
BUN: 52 mg/dL — ABNORMAL HIGH (ref 8–23)
CO2: 28 mmol/L (ref 22–32)
Calcium: 9.5 mg/dL (ref 8.9–10.3)
Chloride: 96 mmol/L — ABNORMAL LOW (ref 98–111)
Creatinine, Ser: 1.57 mg/dL — ABNORMAL HIGH (ref 0.61–1.24)
GFR calc Af Amer: 52 mL/min — ABNORMAL LOW (ref >60)
GFR calc non Af Amer: 45 mL/min — ABNORMAL LOW (ref >60)
Glucose, Bld: 94 mg/dL (ref 70–99)
Potassium: 3.5 mmol/L (ref 3.5–5.1)
Sodium: 138 mmol/L (ref 135–145)
Total Bilirubin: 0.9 mg/dL (ref 0.3–1.2)
Total Protein: 8.1 g/dL (ref 6.5–8.1)

## 2020-04-01 LAB — CEA (IN HOUSE-CHCC): CEA (CHCC-In House): 9.21 ng/mL — ABNORMAL HIGH (ref 0.00–5.00)

## 2020-04-01 LAB — IRON AND TIBC
Iron: 84 ug/dL (ref 42–163)
Saturation Ratios: 29 % (ref 20–55)
TIBC: 289 ug/dL (ref 202–409)
UIBC: 205 ug/dL (ref 117–376)

## 2020-04-01 LAB — FERRITIN: Ferritin: 650 ng/mL — ABNORMAL HIGH (ref 24–336)

## 2020-04-01 NOTE — Telephone Encounter (Signed)
Scheduled per 9/29 los. No avs or calendar needed to be printed. Pt is aware of appt time and date.

## 2020-04-01 NOTE — Progress Notes (Signed)
Steven Barnett   [phone] [phone]   Clinic Follow up Note   Patient Care Team: [name], [name] as PCP - General (Family Medicine) [name] [name], MD as PCP - Cardiology (Cardiology) [name], MD as Consulting Physician (General Surgery) [name] [name], MD as Consulting Physician (Cardiology)  Date of Service:  04/01/2020  CHIEF COMPLAINT: Follow up stage III colon cancer  SUMMARY OF ONCOLOGIC HISTORY: Oncology History Overview Note  Cancer of right colon [facility])   Staging form: Colon and Rectum, AJCC 7th Edition   - Clinical stage from 04/12/2016: Stage IIIA (T2, N1, M0) - Signed by [name], MD on 05/01/2016    Cancer of right colon ([place])  03/04/2016 Procedure   COLONOSCOPY: A polypoid and ulcerated non-obstructing large mass was found in the cecum. The mass was noncircumferential. The mass measured three cm in length. In addition, its diameter measured three mm. (Dr. [name])    03/04/2016 Initial Biopsy   Diagnosis 1. Colon, biopsy, cecal - ADENOCARCINOMA. 2. Colon, polyp(s), transverse - BENIGN COLONIC MUCOSA WITH BENIGN LYMPHOID AGGREGATE. - NO ADENOMATOUS CHANGE OR MALIGNANCY IDENTIFIED. Microscopic Comment 1. The biopsies are involved by moderately to poorly differentiated colorectal type adenocarcinoma.   03/04/2016 Initial Diagnosis   Cecal cancer ([place])   03/15/2016 Imaging   1. Polypoid cecal mass. No findings of liver metastatic disease or other definite metastatic disease. There are some small adjacent pericecal lymph nodes which are not pathologically enlarged. 2. Adenopathy in the chest is present but was also present in 2010, albeit slightly less prominent. This may be reactive adenopathy related to the chronic exudative right pleural effusion.    04/07/2016 Tumor Marker   Patient's tumor was tested for the following markers: CEA. Results of the tumor marker test revealed 2.7.   04/12/2016 Definitive  Surgery   Laparoscopic right hemicolectomy (Dr. [name])   04/12/2016 Pathologic Stage   pT2 pN1 pMX--Grade 3 adenocarcinoma with 2/16 nodes positive, clear margins, negative for perineural or lymphvascular invasion; invades muscularis propria Loss of expression MLH1/PMS2   06/02/2016 - 09/13/2016 Adjuvant Chemotherapy    Ajuvant chemotherapy FOLFOX, every 2 weeks for 6 cycles (3 months), last cycle postponed due to hospitalization    06/29/2016 Genetic Testing   POLE [id number] VUS identified on the Colorectal cancer panel.  Negative genetic testing for the MSH2 inversion analysis ([place] inversion). The Colorectal Cancer Panel offered by [facility] includes sequencing and/or duplication/deletion testing of the following 19 genes: APC, ATM, AXIN2, BMPR1A, CDH1, CHEK2, EPCAM, MLH1, MSH2, MSH6, MUTYH, PMS2, POLD1, POLE, PTEN, SCG5/GREM1, SMAD4, STK11, and TP53. The report date is 06/22/2016 for the [facility] panel and 06/29/2016 for the [place] inversion.  POLE [id number] VUS has been reclassified to a likely benign variant based on a combination of sources, e.g, internal data, published literature, population databases and in silico models. The reclassification date is June 09, 2017.    08/17/2016 - 08/20/2016 Hospital Admission   Admit date: 08/17/2016 Admission diagnosis: Acute respiratory failure with hypoxia  Additional comments: Patient was admitted initially with hypoxia and found to have H influenza positive-started on Tamiflu and broad-spectrum antibiotics were D escalate. He was hypotensive on admission given IV saline 75 cc per hour saline lock 2/16 and antihypertensives which were held including lisinopril and Lasix and hydralazine on discharge. Joint hospitalization he continued on his metoprolol twice a day   01/20/2017 Imaging   CT CAP W Contrast 01/20/17 IMPRESSION: 1. No evidence of local tumor recurrence at the  ileocolic anastomosis . 2. No findings suspicious for metastatic disease in the  chest, abdomen or pelvis. 3. Mild mediastinal lymphadenopathy is stable since 2010 and considered benign . 4. Stable morphologic changes suggestive of cirrhosis. 5. Stable patulous fluid-filled thoracic esophagus. Patchy ground-glass opacity in the posterior right upper lobe is probably inflammatory, possibly due to aspiration. 6. Stable chronic small loculated dependent right pleural effusion.    09/15/2017 Pathology Results   09/15/2017 Surgical Pathology Diagnosis Colon, polyp(s), sigmoid - TUBULAR ADENOMA. - NO HIGH GRADE DYSPLASIA OR MALIGNANCY.   01/15/2018 Imaging   01/15/2018 CT CAP W Contrast IMPRESSION: 1. No findings identified to suggest local tumor recurrence at the ileocolic anastomosis. No evidence for distant metastatic disease. 2. Stable mild mediastinal lymphadenopathy since 2010 and considered benign. 3. Similar appearance of patulous fluid-filled thoracic esophagus. 4. Unchanged right posterior pleural thickening with loculated pleural fluid. 5.  Aortic Atherosclerosis (ICD10-I70.0).   01/15/2018 Tumor Marker   CEA: 04/07/16: 2.7 04/27/2016: 2.11 07/14/2016: 4.25 10/21/2016: 3.60 01/16/17: 3.76 04/25/2017: 5.16 08/28/2017: 2.06 01/15/2018: 2.90    01/28/2019 Imaging   CT CAP W Contrast  IMPRESSION: Stable exam. No evidence of recurrent or metastatic carcinoma. No acute findings.      CURRENT THERAPY:  Surveillance  INTERVAL HISTORY:  Steven Barnett is here for a follow up of colon cancer. He was last seen by me 8 months ago. He presents to the clinic alone. He notes he is doing well. He has started having joint pain in knees and legs. He tried Tumeric and it helped a lot. He notes he stepped on glass of right foot. He is trying to get the skin to a head to he can remove it.     REVIEW OF SYSTEMS:   Constitutional: Denies fevers, chills or abnormal weight loss Eyes: Denies blurriness of vision Ears, nose, mouth, throat, and face: Denies mucositis  or sore throat Respiratory: Denies cough, dyspnea or wheezes Cardiovascular: Denies palpitation, chest discomfort or lower extremity swelling Gastrointestinal:  Denies nausea, heartburn or change in bowel habits Skin: Denies abnormal skin rashes (+) Glass shard in bottom of right foot  MSK: (+) Improved joint pain in knees and legs  Lymphatics: Denies new lymphadenopathy or easy bruising Neurological:Denies numbness, tingling or new weaknesses Behavioral/Psych: Mood is stable, no new changes  All other systems were reviewed with the patient and are negative.  MEDICAL HISTORY:  Past Medical History:  Diagnosis Date  . Anemia   . Arthralgia of both knees   . Arthritis    "right knee" (10/01/2015)  . Atrial fibrillation (HCC)   . Cancer (HCC)    STAGE 1 COLON CANCER  . Chronic anticoagulation 2010   Coumadin  . Chronic diastolic CHF (congestive heart failure), NYHA class 2 (HCC)   . Dyspnea   . Dysrhythmia   . Hypertension   . OSA (obstructive sleep apnea) 2016   "couldn't take the mask during the testing" (10/01/2015)  . Permanent atrial fibrillation (HCC) 2010  . Pneumonia 3/29/2017and june 2017    SURGICAL HISTORY: Past Surgical History:  Procedure Laterality Date  . COLONOSCOPY WITH PROPOFOL N/A 03/04/2016   Procedure: COLONOSCOPY WITH PROPOFOL;  Surgeon: Jeani Hawking, MD;  Location: [facility];  Service: Endoscopy;  Laterality: N/A;  . COLONOSCOPY WITH PROPOFOL N/A 09/15/2017   Procedure: COLONOSCOPY WITH PROPOFOL;  Surgeon: Jeani Hawking, MD;  Location: [facility];  Service: Endoscopy;  Laterality: N/A;  . LAPAROSCOPIC PARTIAL COLECTOMY N/A 04/12/2016   Procedure: LAPAROSCOPIC ILEOCOLECTOMY;  Surgeon:  Almond Lint, MD;  Location: [facility] OR;  Service: General;  Laterality: N/A;  . PORT-A-CATH REMOVAL N/A 02/16/2017   Procedure: REMOVAL PORT-A-CATH;  Surgeon: Almond Lint, MD;  Location: [facility] OR;  Service: General;  Laterality: N/A;  . PORTACATH PLACEMENT N/A 05/18/2016    Procedure: INSERTION PORT-A-CATH;  Surgeon: Almond Lint, MD;  Location: [facility];  Service: General;  Laterality: N/A;  . THORACOTOMY Right 2010    I have reviewed the social history and family history with the patient and they are unchanged from previous note.  ALLERGIES:  has No Known Allergies.  MEDICATIONS:  Current Outpatient Medications  Medication Sig Dispense Refill  . Turmeric (QC TUMERIC COMPLEX PO) Take 1 tablet by mouth daily.    Marland Kitchen acetaminophen (TYLENOL) 500 MG tablet Take 1,000 mg by mouth 2 (two) times daily.    Marland Kitchen albuterol (PROVENTIL HFA;VENTOLIN HFA) 108 (90 Base) MCG/ACT inhaler Inhale 2 puffs into the lungs every 6 (six) hours as needed for wheezing or shortness of breath. 1 Inhaler 2  . aspirin EC 81 MG tablet Take 81 mg by mouth daily.    . colchicine 0.6 MG tablet Take 0.6 mg by mouth 2 (two) times daily.    Marland Kitchen diltiazem (CARDIZEM) 120 MG tablet TAKE 1 TABLET BY MOUTH ONCE DAILY 30 tablet 6  . furosemide (LASIX) 80 MG tablet Take 1 tablet (80 mg total) by mouth 2 (two) times daily. 180 tablet 3  . hydrALAZINE (APRESOLINE) 100 MG tablet     . metolazone (ZAROXOLYN) 5 MG tablet TAKE 1 TABLET BY MOUTH TWICE A WEEK ON  WEDNESDAYS  AND  SATURDAYS 15 tablet 6  . metoprolol tartrate (LOPRESSOR) 25 MG tablet Take 1 tablet (25 mg total) by mouth 2 (two) times daily. 180 tablet 3  . MITIGARE 0.6 MG CAPS     . potassium chloride SA (KLOR-CON) 20 MEQ tablet TAKE 1  BY MOUTH ONCE DAILY 90 tablet 3  . valsartan (DIOVAN) 320 MG tablet TAKE 1 TABLET BY MOUTH ONCE DAILY    . warfarin (COUMADIN) 4 MG tablet TAKE 2 TABLETS BY MOUTH ONCE DAILY AS DIRECTED BY  COUMADIN  CLINIC 60 tablet 1   No current facility-administered medications for this visit.   Facility-Administered Medications Ordered in Other Visits  Medication Dose Route Frequency Provider Last Rate Last Admin  . heparin lock flush 100 unit/mL  500 Units Intravenous Once PRN Malachy Mood, MD      . sodium  chloride flush (NS) 0.9 % injection 10 mL  10 mL Intravenous PRN Malachy Mood, MD        PHYSICAL EXAMINATION: ECOG PERFORMANCE STATUS: 0 - Asymptomatic  Vitals:   04/01/20 1433  BP: 95/65  Pulse: 83  Resp: 18  Temp: 97.8 F (36.6 C)  SpO2: 96%   Filed Weights   04/01/20 1433  Weight: 284 lb 8 oz (129 kg)    Due to COVID19 we will limit examination to appearance. Patient had no complaints.  GENERAL:alert, no distress and comfortable SKIN: skin color normal, no rashes or significant lesions EYES: normal, Conjunctiva are pink and non-injected, sclera clear  NEURO: alert & oriented x 3 with fluent speech   LABORATORY DATA:  I have reviewed the data as listed CBC Latest Ref Rng & Units 04/01/2020 08/02/2019 01/28/2019  WBC 4.0 - 10.5 K/uL 5.9 5.3 6.4  Hemoglobin 13.0 - 17.0 g/dL 88.6 91.9 12.4  Hematocrit 39 - 52 % 47.0 51.2 51.8  Platelets 150 - 400  K/uL 156 180 171     CMP Latest Ref Rng & Units 04/01/2020 11/13/2019 08/02/2019  Glucose 70 - 99 mg/dL 94 84 107(H)  BUN 8 - 23 mg/dL 52(H) 19 17  Creatinine 0.61 - 1.24 mg/dL 1.57(H) 1.11 1.00  Sodium 135 - 145 mmol/L 138 139 139  Potassium 3.5 - 5.1 mmol/L 3.5 4.5 3.8  Chloride 98 - 111 mmol/L 96(L) 98 102  CO2 22 - 32 mmol/L _0 Calcium 8.9 - 10.3 mg/dL 9.5 9.4 8.9  Total Protein 6.5 - 8.1 g/dL 8.1 - 7.6  Total Bilirubin 0.3 - 1.2 mg/dL 0.9 - 0.9  Alkaline Phos 38 - 126 U/L 97 - 133(H)  AST 15 - 41 U/L 19 - 17  ALT 0 - 44 U/L 16 - 13      RADIOGRAPHIC STUDIES: I have personally reviewed the radiological images as listed and agreed with the findings in the report. No results found.   ASSESSMENT & PLAN:  Steven Barnett is a 69 y.o. male with    1. Right colon cancer, invasive adenocarcinoma, G3, pT2N1M0, stage IIIA, MSI-H -He was diagnosed in 03/2016. He is s/p right hemicolectomy and adjuvant chemo FOLFOX -His last colonoscopy was 09/2017, he had 1 small benign polyp. Overall benign exam. Will repeat in 3  years, 2022. -He is clinically stable and doing well. Labs reviewed, CBC And CMP WN. CEA and iron panel still pending.  -He is 4 years from diagnosis. His risk of recurrence has significantly reduced. Do not plan to repeat routine scan unless he develops concerning symptoms. Continue 5 year surveillance.  -Lab and F/u in 1 year for final visit   2. HTN, OSA, CHF, obesity -He will continue medications and continue to follow-up with his primary care physician -He is on Coumadin, managed by PCP.  -His recent leg and knee pain much improved on Tumeric. He can continue.    Plan -Lab and F/u in 1 year.  No problem-specific Assessment & Plan notes found for this encounter.   No orders of the defined types were placed in this encounter.  All questions were answered. The patient knows to call the clinic with any problems, questions or concerns. No barriers to learning was detected. The total time spent in the appointment was 20 minutes.     [name], MD 04/01/2020   I, Joslyn Devon, am acting as scribe for [name], MD.   I have reviewed the above documentation for accuracy and completeness, and I agree with the above.

## 2020-04-06 ENCOUNTER — Other Ambulatory Visit: Payer: Self-pay | Admitting: Internal Medicine

## 2020-04-07 ENCOUNTER — Telehealth: Payer: Self-pay | Admitting: Hematology

## 2020-04-07 ENCOUNTER — Telehealth: Payer: Self-pay

## 2020-04-07 NOTE — Telephone Encounter (Signed)
-----   Message from [name], MD sent at 04/06/2020  8:32 PM EDT ----- Please let pt know his tumor marker from last week was elevated, I recommend repeating lab in 6 weeks, please schedule. If it continues to rise on next lab, will repeat scan then. Thanks   [name]  04/06/2020

## 2020-04-07 NOTE — Telephone Encounter (Signed)
Scheduled appt per 10/5 sch msg - pt daughter [name] aware of appts.

## 2020-04-07 NOTE — Telephone Encounter (Signed)
Spoke with [name] pt daughter made her aware of lab results and recommendations for repeat labs in 6 weeks scheduling message sent

## 2020-04-10 ENCOUNTER — Ambulatory Visit (INDEPENDENT_AMBULATORY_CARE_PROVIDER_SITE_OTHER): Payer: Medicare HMO

## 2020-04-10 ENCOUNTER — Other Ambulatory Visit: Payer: Self-pay

## 2020-04-10 DIAGNOSIS — I4821 Permanent atrial fibrillation: Secondary | ICD-10-CM | POA: Diagnosis not present

## 2020-04-10 DIAGNOSIS — Z5181 Encounter for therapeutic drug level monitoring: Secondary | ICD-10-CM | POA: Diagnosis not present

## 2020-04-10 LAB — POCT INR: INR: 3.7 — AB (ref 2.0–3.0)

## 2020-04-10 NOTE — Patient Instructions (Signed)
Hold Coumadin tonight and then continue with 2 tables daily. INR in 4 weeks. Call us with medication changes or concerns [phone].

## 2020-04-16 ENCOUNTER — Ambulatory Visit: Payer: Medicare HMO | Admitting: Hematology

## 2020-04-16 ENCOUNTER — Telehealth: Payer: Self-pay

## 2020-04-16 ENCOUNTER — Other Ambulatory Visit: Payer: Medicare HMO

## 2020-04-16 DIAGNOSIS — C182 Malignant neoplasm of ascending colon: Secondary | ICD-10-CM

## 2020-04-16 NOTE — Telephone Encounter (Signed)
Per dr. Burr Medico if Steven Barnett is not having any problems he does not need to be seen today.  He was to have lab and f/u 6 weeks from last appt 9/28.  I spoke with his daughter and he is not having any problems.  Appt rescheduled.  Ms Mitton verbalized understanding.

## 2020-05-07 ENCOUNTER — Other Ambulatory Visit: Payer: Self-pay | Admitting: Internal Medicine

## 2020-05-08 ENCOUNTER — Other Ambulatory Visit: Payer: Self-pay

## 2020-05-08 ENCOUNTER — Ambulatory Visit (INDEPENDENT_AMBULATORY_CARE_PROVIDER_SITE_OTHER): Payer: Medicare HMO

## 2020-05-08 DIAGNOSIS — I4821 Permanent atrial fibrillation: Secondary | ICD-10-CM

## 2020-05-08 DIAGNOSIS — Z5181 Encounter for therapeutic drug level monitoring: Secondary | ICD-10-CM

## 2020-05-08 LAB — POCT INR: INR: 3.3 — AB (ref 2.0–3.0)

## 2020-05-08 NOTE — Patient Instructions (Signed)
Hold Coumadin tonight and then decrease to 2 tables daily except 1 tablet on Wednesday. INR in 4 weeks. Call us with medication changes or concerns [phone].

## 2020-05-08 NOTE — Progress Notes (Signed)
- Steven Barnett   Telephone:([phone]) [phone] Fax:(336) [phone]   Clinic Follow up Note   Patient Care Team: [name], [name] as PCP - General (Family Medicine) [name] [name], MD as PCP - Cardiology (Cardiology) [name], MD as Consulting Physician (General Surgery) [name] [name], MD as Consulting Physician (Cardiology)  Date of Service:  05/11/2020  CHIEF COMPLAINT: Follow up stage III colon cancer  SUMMARY OF ONCOLOGIC HISTORY: Oncology History Overview Note  Cancer of right colon [facility])   Staging form: Colon and Rectum, AJCC 7th Edition   - Clinical stage from 04/12/2016: Stage IIIA (T2, N1, M0) - Signed by [name], MD on 05/01/2016    Cancer of right colon ([place])  03/04/2016 Procedure   COLONOSCOPY: A polypoid and ulcerated non-obstructing large mass was found in the cecum. The mass was noncircumferential. The mass measured three cm in length. In addition, its diameter measured three mm. (Dr. [name])    03/04/2016 Initial Biopsy   Diagnosis 1. Colon, biopsy, cecal - ADENOCARCINOMA. 2. Colon, polyp(s), transverse - BENIGN COLONIC MUCOSA WITH BENIGN LYMPHOID AGGREGATE. - NO ADENOMATOUS CHANGE OR MALIGNANCY IDENTIFIED. Microscopic Comment 1. The biopsies are involved by moderately to poorly differentiated colorectal type adenocarcinoma.   03/04/2016 Initial Diagnosis   Cecal cancer ([place])   03/15/2016 Imaging   1. Polypoid cecal mass. No findings of liver metastatic disease or other definite metastatic disease. There are some small adjacent pericecal lymph nodes which are not pathologically enlarged. 2. Adenopathy in the chest is present but was also present in 2010, albeit slightly less prominent. This may be reactive adenopathy related to the chronic exudative right pleural effusion.    04/07/2016 Tumor Marker   Patient's tumor was tested for the following markers: CEA. Results of the tumor marker test revealed 2.7.   04/12/2016 Definitive  Surgery   Laparoscopic right hemicolectomy (Dr. [name])   04/12/2016 Pathologic Stage   pT2 pN1 pMX--Grade 3 adenocarcinoma with 2/16 nodes positive, clear margins, negative for perineural or lymphvascular invasion; invades muscularis propria Loss of expression MLH1/PMS2   06/02/2016 - 09/13/2016 Adjuvant Chemotherapy    Ajuvant chemotherapy FOLFOX, every 2 weeks for 6 cycles (3 months), last cycle postponed due to hospitalization    06/29/2016 Genetic Testing   POLE c.4523G>A VUS identified on the Colorectal cancer panel.  Negative genetic testing for the MSH2 inversion analysis ([place] inversion). The Colorectal Cancer Panel offered by [facility] includes sequencing and/or duplication/deletion testing of the following 19 genes: APC, ATM, AXIN2, BMPR1A, CDH1, CHEK2, EPCAM, MLH1, MSH2, MSH6, MUTYH, PMS2, POLD1, POLE, PTEN, SCG5/GREM1, SMAD4, STK11, and TP53. The report date is 06/22/2016 for the [facility] panel and 06/29/2016 for the [place] inversion.  POLE c.4523G>A VUS has been reclassified to a likely benign variant based on a combination of sources, e.g, internal data, published literature, population databases and in silico models. The reclassification date is June 09, 2017.    08/17/2016 - 08/20/2016 Hospital Admission   Admit date: 08/17/2016 Admission diagnosis: Acute respiratory failure with hypoxia  Additional comments: Patient was admitted initially with hypoxia and found to have H influenza positive-started on Tamiflu and broad-spectrum antibiotics were D escalate. He was hypotensive on admission given IV saline 75 cc per hour saline lock 2/16 and antihypertensives which were held including lisinopril and Lasix and hydralazine on discharge. Joint hospitalization he continued on his metoprolol twice a day   01/20/2017 Imaging   CT CAP W Contrast 01/20/17 IMPRESSION: 1. No evidence of local tumor recurrence at  the ileocolic anastomosis . 2. No findings suspicious for metastatic disease in the  chest, abdomen or pelvis. 3. Mild mediastinal lymphadenopathy is stable since 2010 and considered benign . 4. Stable morphologic changes suggestive of cirrhosis. 5. Stable patulous fluid-filled thoracic esophagus. Patchy ground-glass opacity in the posterior right upper lobe is probably inflammatory, possibly due to aspiration. 6. Stable chronic small loculated dependent right pleural effusion.    09/15/2017 Pathology Results   09/15/2017 Surgical Pathology Diagnosis Colon, polyp(s), sigmoid - TUBULAR ADENOMA. - NO HIGH GRADE DYSPLASIA OR MALIGNANCY.   01/15/2018 Imaging   01/15/2018 CT CAP W Contrast IMPRESSION: 1. No findings identified to suggest local tumor recurrence at the ileocolic anastomosis. No evidence for distant metastatic disease. 2. Stable mild mediastinal lymphadenopathy since 2010 and considered benign. 3. Similar appearance of patulous fluid-filled thoracic esophagus. 4. Unchanged right posterior pleural thickening with loculated pleural fluid. 5.  Aortic Atherosclerosis (ICD10-I70.0).   01/15/2018 Tumor Marker   CEA: 04/07/16: 2.7 04/27/2016: 2.11 07/14/2016: 4.25 10/21/2016: 3.60 01/16/17: 3.76 04/25/2017: 5.16 08/28/2017: 2.06 01/15/2018: 2.90    01/28/2019 Imaging   CT CAP W Contrast  IMPRESSION: Stable exam. No evidence of recurrent or metastatic carcinoma. No acute findings.      CURRENT THERAPY:  Surveillance  INTERVAL HISTORY:  Steven Barnett is here for a follow up of colon cancer. He presents to the clinic with his wife. He notes he is doing well. He denies any new major changes. He denies any major pain. He notes he has tried to lose weight but he continues to gain. He notes he has arthritis which makes It hard to make a first. Tumeric helps his pain. He notes swelling and uses Voltaren.    REVIEW OF SYSTEMS:   Constitutional: Denies fevers, chills or abnormal weight loss Eyes: Denies blurriness of vision Ears, nose, mouth, throat, and  face: Denies mucositis or sore throat Respiratory: Denies cough, dyspnea or wheezes Cardiovascular: Denies palpitation, chest discomfort or lower extremity swelling Gastrointestinal:  Denies nausea, heartburn or change in bowel habits Skin: Denies abnormal skin rashes MKS: (+) Arthritis  Lymphatics: Denies new lymphadenopathy or easy bruising Neurological:Denies numbness, tingling or new weaknesses Behavioral/Psych: Mood is stable, no new changes  All other systems were reviewed with the patient and are negative.  MEDICAL HISTORY:  Past Medical History:  Diagnosis Date  . Anemia   . Arthralgia of both knees   . Arthritis    "right knee" (10/01/2015)  . Atrial fibrillation ([place])   . Cancer (HCC)    STAGE 1 COLON CANCER  . Chronic anticoagulation 2010   Coumadin  . Chronic diastolic CHF (congestive heart failure), NYHA class 2 ([place])   . Dyspnea   . Dysrhythmia   . Hypertension   . OSA (obstructive sleep apnea) 2016   "couldn't take the mask during the testing" (10/01/2015)  . Permanent atrial fibrillation ([place]) 2010  . Pneumonia 3/29/2017and june 2017    SURGICAL HISTORY: Past Surgical History:  Procedure Laterality Date  . COLONOSCOPY WITH PROPOFOL N/A 03/04/2016   Procedure: COLONOSCOPY WITH PROPOFOL;  Surgeon: Carol Ada, MD;  Location: [facility];  Service: Endoscopy;  Laterality: N/A;  . COLONOSCOPY WITH PROPOFOL N/A 09/15/2017   Procedure: COLONOSCOPY WITH PROPOFOL;  Surgeon: Carol Ada, MD;  Location: [facility];  Service: Endoscopy;  Laterality: N/A;  . LAPAROSCOPIC PARTIAL COLECTOMY N/A 04/12/2016   Procedure: LAPAROSCOPIC ILEOCOLECTOMY;  Surgeon: [name], MD;  Location: [place];  Service: General;  Laterality: N/A;  . PORT-A-CATH REMOVAL  N/A 02/16/2017   Procedure: REMOVAL PORT-A-CATH;  Surgeon: [name], MD;  Location: [place];  Service: General;  Laterality: N/A;  . PORTACATH PLACEMENT N/A 05/18/2016   Procedure: INSERTION PORT-A-CATH;  Surgeon: [name], MD;  Location: [place];  Service: General;  Laterality: N/A;  . THORACOTOMY Right 2010    I have reviewed the social history and family history with the patient and they are unchanged from previous note.  ALLERGIES:  has No Known Allergies.  MEDICATIONS:  Current Outpatient Medications  Medication Sig Dispense Refill  . acetaminophen (TYLENOL) 500 MG tablet Take 1,000 mg by mouth 2 (two) times daily.    Marland Kitchen albuterol (PROVENTIL HFA;VENTOLIN HFA) 108 (90 Base) MCG/ACT inhaler Inhale 2 puffs into the lungs every 6 (six) hours as needed for wheezing or shortness of breath. 1 Inhaler 2  . aspirin EC 81 MG tablet Take 81 mg by mouth daily.    . colchicine 0.6 MG tablet Take 0.6 mg by mouth 2 (two) times daily.    Marland Kitchen diltiazem (CARDIZEM) 120 MG tablet TAKE 1 TABLET BY MOUTH ONCE DAILY 30 tablet 6  . furosemide (LASIX) 80 MG tablet Take 1 tablet (80 mg total) by mouth 2 (two) times daily. 180 tablet 3  . hydrALAZINE (APRESOLINE) 100 MG tablet     . metolazone (ZAROXOLYN) 5 MG tablet TAKE 1 TABLET BY MOUTH TWICE A WEEK ON  WEDNESDAYS  AND  SATURDAYS 15 tablet 6  . metoprolol tartrate (LOPRESSOR) 25 MG tablet Take 1 tablet (25 mg total) by mouth 2 (two) times daily. 180 tablet 3  . MITIGARE 0.6 MG CAPS     . potassium chloride SA (KLOR-CON) 20 MEQ tablet TAKE 1  BY MOUTH ONCE DAILY 90 tablet 3  . Turmeric (QC TUMERIC COMPLEX PO) Take 1 tablet by mouth daily.    . valsartan (DIOVAN) 320 MG tablet TAKE 1 TABLET BY MOUTH ONCE DAILY    . warfarin (COUMADIN) 4 MG tablet TAKE 2 TABLETS BY MOUTH ONCE DAILY AS  DIRECTED  COUMADIN  CLINIC 60 tablet 0   No current facility-administered medications for this visit.   Facility-Administered Medications Ordered in Other Visits  Medication Dose Route Frequency Provider Last Rate Last Admin  . heparin lock flush 100 unit/mL  500 Units Intravenous Once PRN [name], MD      . sodium chloride flush (NS) 0.9 % injection 10 mL  10 mL  Intravenous PRN [name], MD        PHYSICAL EXAMINATION: ECOG PERFORMANCE STATUS: 1 - Symptomatic but completely ambulatory  Vitals:   05/11/20 1335  BP: 112/66  Pulse: 73  Resp: 18  Temp: (!) 97.2 F (36.2 C)  SpO2: 97%   Filed Weights   05/11/20 1335  Weight: 293 lb 1.6 oz (132.9 kg)    GENERAL:alert, no distress and comfortable SKIN: skin color, texture, turgor are normal, no rashes or significant lesions EYES: normal, Conjunctiva are pink and non-injected, sclera clear  NECK: supple, thyroid normal size, non-tender, without nodularity LYMPH:  no palpable lymphadenopathy in the cervical, axillary  LUNGS: clear to auscultation and percussion with normal breathing effort HEART: regular rate & rhythm and no murmurs (+) Minimal lower extremity edema ABDOMEN:abdomen soft, non-tender and normal bowel sounds. No hepatomegaly  Musculoskeletal:no cyanosis of digits and no clubbing  NEURO: alert & oriented x 3 with fluent speech, no focal motor/sensory deficits  LABORATORY DATA:  I have reviewed the data as listed CBC Latest Ref  Rng & Units 05/11/2020 04/01/2020 08/02/2019  WBC 4.0 - 10.5 K/uL 7.8 5.9 5.3  Hemoglobin 13.0 - 17.0 g/dL 13.8 15.3 16.0  Hematocrit 39 - 52 % 42.5 47.0 51.2  Platelets 150 - 400 K/uL 148(L) 156 180     CMP Latest Ref Rng & Units 05/11/2020 04/01/2020 11/13/2019  Glucose 70 - 99 mg/dL 79 94 84  BUN 8 - 23 mg/dL 21 52(H) 19  Creatinine 0.61 - 1.24 mg/dL 1.24 1.57(H) 1.11  Sodium 135 - 145 mmol/L 137 138 139  Potassium 3.5 - 5.1 mmol/L 4.5 3.5 4.5  Chloride 98 - 111 mmol/L 105 96(L) 98  CO2 22 - 32 mmol/L _0 Calcium 8.9 - 10.3 mg/dL 8.8(L) 9.5 9.4  Total Protein 6.5 - 8.1 g/dL 7.1 8.1 -  Total Bilirubin 0.3 - 1.2 mg/dL 1.6(H) 0.9 -  Alkaline Phos 38 - 126 U/L 82 97 -  AST 15 - 41 U/L 19 19 -  ALT 0 - 44 U/L 15 16 -      RADIOGRAPHIC STUDIES: I have personally reviewed the radiological images as listed and agreed with the findings in the  report. No results found.   ASSESSMENT & PLAN:  Steven Barnett is a 69 y.o. male with    1. Right colon cancer, invasive adenocarcinoma, G3, pT2N1M0, stage IIIA, MSI-H -He was diagnosed in 03/2016. He is s/p right hemicolectomy and adjuvant chemo FOLFOX -His last colonoscopy was 09/2017, he had 1 small benign polyp. Overall benign exam. Will repeat in 3 years, 2022. -He is clinically doing well. Labs reviewed, CBC and CMP WNL except plt 148K. Physical exam unremarkable.  -His CEA was elevated at 9.21 on 04/01/20 after being normal before. He has no new concerning symptoms. I repeated CEA today. If continues to increase will scan him again. Otherwise this recent elevation could be nonspecific. I discussed the option of GuardantReveal Testing to evaluate his risk of recurrence. He agrees  -He is 4 years from diagnosis. His risk of recurrence has significantly reduced. Continue 5 year surveillance.  -F/u in 4 months for follow up    2. HTN, OSA, CHF, obesity -He will continue medications and continue to follow-up with his primary care physician -He is on Coumadin, managed by PCP.  -His recent leg and knee pain much improved on Tumeric. He can continue.    Plan -today's CEA was 8.58, will get GuardantReveal test once, if positive, will get CT scan -lab and f/u in 4 months of GuardantReveal negative     No problem-specific Assessment & Plan notes found for this encounter.   Orders Placed This Encounter  Procedures  . Guardant 360    GuardantReavel    Standing Status:   Future    Standing Expiration Date:   05/11/2021   All questions were answered. The patient knows to call the clinic with any problems, questions or concerns. No barriers to learning was detected. The total time spent in the appointment was 20 minutes.     [name], MD 05/11/2020   I, Joslyn Devon, am acting as scribe for [name], MD.   I have reviewed the above documentation for accuracy and completeness,  and I agree with the above.

## 2020-05-11 ENCOUNTER — Inpatient Hospital Stay: Payer: Medicare HMO | Attending: Hematology | Admitting: Hematology

## 2020-05-11 ENCOUNTER — Other Ambulatory Visit: Payer: Self-pay | Admitting: *Deleted

## 2020-05-11 ENCOUNTER — Encounter: Payer: Self-pay | Admitting: Hematology

## 2020-05-11 ENCOUNTER — Other Ambulatory Visit: Payer: Self-pay

## 2020-05-11 ENCOUNTER — Inpatient Hospital Stay: Payer: Medicare HMO

## 2020-05-11 VITALS — BP 112/66 | HR 73 | Temp 97.2°F | Resp 18 | Ht 70.0 in | Wt 293.1 lb

## 2020-05-11 DIAGNOSIS — E669 Obesity, unspecified: Secondary | ICD-10-CM | POA: Diagnosis not present

## 2020-05-11 DIAGNOSIS — C182 Malignant neoplasm of ascending colon: Secondary | ICD-10-CM

## 2020-05-11 DIAGNOSIS — I509 Heart failure, unspecified: Secondary | ICD-10-CM | POA: Diagnosis not present

## 2020-05-11 DIAGNOSIS — C180 Malignant neoplasm of cecum: Secondary | ICD-10-CM

## 2020-05-11 DIAGNOSIS — I110 Hypertensive heart disease with heart failure: Secondary | ICD-10-CM | POA: Insufficient documentation

## 2020-05-11 LAB — COMPREHENSIVE METABOLIC PANEL
ALT: 15 U/L (ref 0–44)
AST: 19 U/L (ref 15–41)
Albumin: 3.6 g/dL (ref 3.5–5.0)
Alkaline Phosphatase: 82 U/L (ref 38–126)
Anion gap: 6 (ref 5–15)
BUN: 21 mg/dL (ref 8–23)
CO2: 26 mmol/L (ref 22–32)
Calcium: 8.8 mg/dL — ABNORMAL LOW (ref 8.9–10.3)
Chloride: 105 mmol/L (ref 98–111)
Creatinine, Ser: 1.24 mg/dL (ref 0.61–1.24)
GFR, Estimated: >60 mL/min (ref >60)
Glucose, Bld: 79 mg/dL (ref 70–99)
Potassium: 4.5 mmol/L (ref 3.5–5.1)
Sodium: 137 mmol/L (ref 135–145)
Total Bilirubin: 1.6 mg/dL — ABNORMAL HIGH (ref 0.3–1.2)
Total Protein: 7.1 g/dL (ref 6.5–8.1)

## 2020-05-11 LAB — CBC WITH DIFFERENTIAL/PLATELET
Abs Immature Granulocytes: 0.04 10*3/uL (ref 0.00–0.07)
Basophils Absolute: 0.1 10*3/uL (ref 0.0–0.1)
Basophils Relative: 1 %
Eosinophils Absolute: 0.2 10*3/uL (ref 0.0–0.5)
Eosinophils Relative: 2 %
HCT: 42.5 % (ref 39.0–52.0)
Hemoglobin: 13.8 g/dL (ref 13.0–17.0)
Immature Granulocytes: 1 %
Lymphocytes Relative: 9 %
Lymphs Abs: 0.7 10*3/uL (ref 0.7–4.0)
MCH: 27.4 pg (ref 26.0–34.0)
MCHC: 32.5 g/dL (ref 30.0–36.0)
MCV: 84.3 fL (ref 80.0–100.0)
Monocytes Absolute: 0.8 10*3/uL (ref 0.1–1.0)
Monocytes Relative: 10 %
Neutro Abs: 6 10*3/uL (ref 1.7–7.7)
Neutrophils Relative %: 77 %
Platelets: 148 10*3/uL — ABNORMAL LOW (ref 150–400)
RBC: 5.04 MIL/uL (ref 4.22–5.81)
RDW: 15.5 % (ref 11.5–15.5)
WBC: 7.8 10*3/uL (ref 4.0–10.5)
nRBC: 0.3 % — ABNORMAL HIGH (ref 0.0–0.2)

## 2020-05-11 LAB — FERRITIN: Ferritin: 520 ng/mL — ABNORMAL HIGH (ref 24–336)

## 2020-05-11 LAB — CEA (IN HOUSE-CHCC): CEA (CHCC-In House): 8.58 ng/mL — ABNORMAL HIGH (ref 0.00–5.00)

## 2020-05-13 ENCOUNTER — Telehealth: Payer: Self-pay | Admitting: Hematology

## 2020-05-13 NOTE — Telephone Encounter (Signed)
No 1/18 los.  °

## 2020-06-11 ENCOUNTER — Other Ambulatory Visit: Payer: Self-pay | Admitting: Internal Medicine

## 2020-06-24 ENCOUNTER — Telehealth: Payer: Self-pay

## 2020-06-24 NOTE — Telephone Encounter (Signed)
Unable to lmom for overdue inr mailbox full

## 2020-06-29 ENCOUNTER — Ambulatory Visit (INDEPENDENT_AMBULATORY_CARE_PROVIDER_SITE_OTHER): Payer: Medicare HMO

## 2020-06-29 ENCOUNTER — Other Ambulatory Visit: Payer: Self-pay

## 2020-06-29 DIAGNOSIS — I4821 Permanent atrial fibrillation: Secondary | ICD-10-CM | POA: Diagnosis not present

## 2020-06-29 DIAGNOSIS — Z5181 Encounter for therapeutic drug level monitoring: Secondary | ICD-10-CM | POA: Diagnosis not present

## 2020-06-29 LAB — POCT INR: INR: 3.9 — AB (ref 2.0–3.0)

## 2020-06-29 NOTE — Patient Instructions (Signed)
Hold Coumadin tonight and then continue taking 2 tables daily except 1 tablet on Wednesday. INR in 4 weeks. Call us with medication changes or concerns [phone].

## 2020-07-15 ENCOUNTER — Encounter: Payer: Self-pay | Admitting: Hematology

## 2020-07-17 ENCOUNTER — Other Ambulatory Visit: Payer: Self-pay | Admitting: Internal Medicine

## 2020-07-18 ENCOUNTER — Other Ambulatory Visit: Payer: Self-pay | Admitting: Internal Medicine

## 2020-07-21 ENCOUNTER — Other Ambulatory Visit: Payer: Self-pay | Admitting: Pharmacist

## 2020-07-21 MED ORDER — WARFARIN SODIUM 4 MG PO TABS
ORAL_TABLET | ORAL | 0 refills | Status: DC
Start: 2020-07-21 — End: 2020-09-09

## 2020-07-24 ENCOUNTER — Inpatient Hospital Stay: Payer: Medicare HMO | Attending: Hematology

## 2020-07-24 ENCOUNTER — Encounter: Payer: Self-pay | Admitting: Hematology

## 2020-07-24 ENCOUNTER — Other Ambulatory Visit: Payer: Self-pay

## 2020-07-24 DIAGNOSIS — C182 Malignant neoplasm of ascending colon: Secondary | ICD-10-CM

## 2020-07-24 DIAGNOSIS — C180 Malignant neoplasm of cecum: Secondary | ICD-10-CM

## 2020-07-27 ENCOUNTER — Other Ambulatory Visit: Payer: Self-pay

## 2020-07-27 ENCOUNTER — Ambulatory Visit (INDEPENDENT_AMBULATORY_CARE_PROVIDER_SITE_OTHER): Payer: Medicare HMO

## 2020-07-27 DIAGNOSIS — I4821 Permanent atrial fibrillation: Secondary | ICD-10-CM

## 2020-07-27 DIAGNOSIS — Z5181 Encounter for therapeutic drug level monitoring: Secondary | ICD-10-CM | POA: Diagnosis not present

## 2020-07-27 LAB — POCT INR: INR: 1.8 — AB (ref 2.0–3.0)

## 2020-07-27 NOTE — Patient Instructions (Signed)
Take 3 tablets tonight only and then continue taking 2 tables daily except 1 tablet on Wednesday. INR in 4 weeks. Call us with medication changes or concerns [phone].

## 2020-08-14 ENCOUNTER — Other Ambulatory Visit: Payer: Self-pay

## 2020-08-14 ENCOUNTER — Emergency Department (HOSPITAL_COMMUNITY)
Admission: EM | Admit: 2020-08-14 | Discharge: 2020-08-15 | Disposition: A | Discharge status: Home / Self Care | Payer: Medicare HMO | Attending: Emergency Medicine | Admitting: Emergency Medicine

## 2020-08-14 ENCOUNTER — Ambulatory Visit (HOSPITAL_COMMUNITY)
Admission: EM | Admit: 2020-08-14 | Discharge: 2020-08-14 | Disposition: A | Discharge status: Home / Self Care | Payer: Medicare HMO | Source: Home / Self Care

## 2020-08-14 ENCOUNTER — Encounter (HOSPITAL_COMMUNITY): Payer: Self-pay

## 2020-08-14 DIAGNOSIS — Z79899 Other long term (current) drug therapy: Secondary | ICD-10-CM | POA: Insufficient documentation

## 2020-08-14 DIAGNOSIS — Z7982 Long term (current) use of aspirin: Secondary | ICD-10-CM | POA: Diagnosis not present

## 2020-08-14 DIAGNOSIS — I5033 Acute on chronic diastolic (congestive) heart failure: Secondary | ICD-10-CM | POA: Diagnosis not present

## 2020-08-14 DIAGNOSIS — Z85038 Personal history of other malignant neoplasm of large intestine: Secondary | ICD-10-CM | POA: Insufficient documentation

## 2020-08-14 DIAGNOSIS — R109 Unspecified abdominal pain: Secondary | ICD-10-CM

## 2020-08-14 DIAGNOSIS — N23 Unspecified renal colic: Secondary | ICD-10-CM

## 2020-08-14 DIAGNOSIS — I110 Hypertensive heart disease with heart failure: Secondary | ICD-10-CM | POA: Insufficient documentation

## 2020-08-14 DIAGNOSIS — I4891 Unspecified atrial fibrillation: Secondary | ICD-10-CM | POA: Diagnosis not present

## 2020-08-14 DIAGNOSIS — M5416 Radiculopathy, lumbar region: Secondary | ICD-10-CM

## 2020-08-14 DIAGNOSIS — M545 Low back pain, unspecified: Secondary | ICD-10-CM

## 2020-08-14 DIAGNOSIS — Z87891 Personal history of nicotine dependence: Secondary | ICD-10-CM | POA: Diagnosis not present

## 2020-08-14 DIAGNOSIS — Z7901 Long term (current) use of anticoagulants: Secondary | ICD-10-CM | POA: Diagnosis not present

## 2020-08-14 DIAGNOSIS — N179 Acute kidney failure, unspecified: Secondary | ICD-10-CM

## 2020-08-14 DIAGNOSIS — M7130 Other bursal cyst, unspecified site: Secondary | ICD-10-CM

## 2020-08-14 LAB — POCT URINALYSIS DIPSTICK, ED / UC
Glucose, UA: NEGATIVE mg/dL
Hgb urine dipstick: NEGATIVE
Leukocytes,Ua: NEGATIVE
Nitrite: NEGATIVE
Protein, ur: NEGATIVE mg/dL
Specific Gravity, Urine: 1.02 (ref 1.005–1.030)
Urobilinogen, UA: 2 mg/dL — ABNORMAL HIGH (ref 0.0–1.0)
pH: 5 (ref 5.0–8.0)

## 2020-08-14 LAB — COMPREHENSIVE METABOLIC PANEL
ALT: 19 U/L (ref 0–44)
AST: 23 U/L (ref 15–41)
Albumin: 3.6 g/dL (ref 3.5–5.0)
Alkaline Phosphatase: 61 U/L (ref 38–126)
Anion gap: 17 — ABNORMAL HIGH (ref 5–15)
BUN: 65 mg/dL — ABNORMAL HIGH (ref 8–23)
CO2: 25 mmol/L (ref 22–32)
Calcium: 9.3 mg/dL (ref 8.9–10.3)
Chloride: 90 mmol/L — ABNORMAL LOW (ref 98–111)
Creatinine, Ser: 2.41 mg/dL — ABNORMAL HIGH (ref 0.61–1.24)
GFR, Estimated: 29 mL/min — ABNORMAL LOW (ref >60)
Glucose, Bld: 88 mg/dL (ref 70–99)
Potassium: 4.5 mmol/L (ref 3.5–5.1)
Sodium: 132 mmol/L — ABNORMAL LOW (ref 135–145)
Total Bilirubin: 2.2 mg/dL — ABNORMAL HIGH (ref 0.3–1.2)
Total Protein: 8.3 g/dL — ABNORMAL HIGH (ref 6.5–8.1)

## 2020-08-14 LAB — CBC
HCT: 43.1 % (ref 39.0–52.0)
Hemoglobin: 14.5 g/dL (ref 13.0–17.0)
MCH: 27.5 pg (ref 26.0–34.0)
MCHC: 33.6 g/dL (ref 30.0–36.0)
MCV: 81.8 fL (ref 80.0–100.0)
Platelets: 176 10*3/uL (ref 150–400)
RBC: 5.27 MIL/uL (ref 4.22–5.81)
RDW: 15.7 % — ABNORMAL HIGH (ref 11.5–15.5)
WBC: 12.2 10*3/uL — ABNORMAL HIGH (ref 4.0–10.5)
nRBC: 0 % (ref 0.0–0.2)

## 2020-08-14 LAB — LIPASE, BLOOD: Lipase: 23 U/L (ref 11–51)

## 2020-08-14 LAB — GUARDANT 360

## 2020-08-14 NOTE — ED Triage Notes (Signed)
Pt in with c/o right back pain that has been going on for 3 days now. Pt states the pain feels like "he is being stabbed in the kidneys"  Pt also c/o dark yellow urine  He states that he took medication for his back pain with no relief

## 2020-08-14 NOTE — ED Triage Notes (Signed)
Patient complains of back pain x2 days, states "it feels like I'm being stabbed in the kidneys". Denies vomiting, diarrhea.

## 2020-08-14 NOTE — ED Provider Notes (Signed)
[place]    CSN: [id number] Arrival date & time: 08/14/20  1617      History   Chief Complaint Chief Complaint  Patient presents with  . Back Pain    HPI Steven Barnett is a 70 y.o. male presenting for 3 days of 10/10 right-sided CVAT and dark colored urine. History anemia, arthritis, A. fib, cancer, chronic anticoagulation, CHF, hypertension, OSA.  Presenting with 3 days of pain like he is being stabbed in the right kidney, dark yellow urine.  Some right upper quadrant pain but states this is more minor. denies history of UTI, denies history of kidney stone. Denies hematuria, dysuria, frequency, urgency, n/v/d, fevers/chills, abdnormal penile discharge.    HPI  Past Medical History:  Diagnosis Date  . Anemia   . Arthralgia of both knees   . Arthritis    "right knee" (10/01/2015)  . Atrial fibrillation ([place])   . Cancer (HCC)    STAGE 1 COLON CANCER  . Chronic anticoagulation 2010   Coumadin  . Chronic diastolic CHF (congestive heart failure), NYHA class 2 ([place])   . Dyspnea   . Dysrhythmia   . Hypertension   . OSA (obstructive sleep apnea) 2016   "couldn't take the mask during the testing" (10/01/2015)  . Permanent atrial fibrillation ([place]) 2010  . Pneumonia 3/29/2017and june 2017    Patient Active Problem List   Diagnosis Date Noted  . Encounter for therapeutic drug monitoring 10/12/2018  . Hypotension 08/17/2016  . SIRS (systemic inflammatory response syndrome) ([place]) 08/17/2016  . Influenza A 08/17/2016  . Septic shock ([place]) 08/10/2016  . Neutropenia, drug-induced ([place])   . Gastroenteritis due to norovirus   . AKI (acute kidney injury) ([place])   . Genetic testing 07/01/2016  . Port catheter in place 06/29/2016  . Family history of colon cancer 06/08/2016  . Family history of colonic polyps 06/08/2016  . Cancer of right colon ([place]) 04/12/2016  . Pre-operative cardiovascular examination 04/06/2016  . CAP (community acquired pneumonia) 02/02/2016  .  Acute on chronic diastolic heart failure ([place]) 02/02/2016  . Acute respiratory failure with hypoxia ([place]) 02/02/2016  . Anemia, iron deficiency 02/02/2016  . Current use of long term anticoagulation 12/03/2015  . Pneumonia 09/30/2015  . PNA (pneumonia) 09/30/2015  . Acute on chronic congestive heart failure ([place])   . CHF exacerbation ([place]) 07/08/2015  . Community acquired pneumonia 02/10/2015  . ED (erectile dysfunction) 08/12/2014  . Arthralgia of both knees 04/08/2014  . Chronic diastolic (congestive) heart failure ([place]) 01/14/2009  . Morbid obesity ([place]) 12/31/2008  . Obstructive sleep apnea 12/31/2008  . Essential hypertension, benign 12/31/2008  . Permanent atrial fibrillation ([place]) 12/31/2008    Past Surgical History:  Procedure Laterality Date  . COLONOSCOPY WITH PROPOFOL N/A 03/04/2016   Procedure: COLONOSCOPY WITH PROPOFOL;  Surgeon: Carol Ada, MD;  Location: [facility];  Service: Endoscopy;  Laterality: N/A;  . COLONOSCOPY WITH PROPOFOL N/A 09/15/2017   Procedure: COLONOSCOPY WITH PROPOFOL;  Surgeon: Carol Ada, MD;  Location: [facility];  Service: Endoscopy;  Laterality: N/A;  . LAPAROSCOPIC PARTIAL COLECTOMY N/A 04/12/2016   Procedure: LAPAROSCOPIC ILEOCOLECTOMY;  Surgeon: [name], MD;  Location: [facility];  Service: General;  Laterality: N/A;  . PORT-A-CATH REMOVAL N/A 02/16/2017   Procedure: REMOVAL PORT-A-CATH;  Surgeon: [name], MD;  Location: [facility];  Service: General;  Laterality: N/A;  . PORTACATH PLACEMENT N/A 05/18/2016   Procedure: INSERTION PORT-A-CATH;  Surgeon: [name], MD;  Location: [facility];  Service: General;  Laterality: N/A;  . THORACOTOMY Right 2010       Home Medications    Prior to Admission medications   Medication Sig Start Date End Date Taking? Authorizing Provider  acetaminophen (TYLENOL) 500 MG tablet Take 1,000 mg by mouth 2 (two) times daily.    [provider]  albuterol (PROVENTIL  HFA;VENTOLIN HFA) 108 (90 Base) MCG/ACT inhaler Inhale 2 puffs into the lungs every 6 (six) hours as needed for wheezing or shortness of breath. 10/01/15   Nita Sells, MD  aspirin EC 81 MG tablet Take 81 mg by mouth daily.    [provider]  colchicine 0.6 MG tablet Take 0.6 mg by mouth 2 (two) times daily.    [provider]  diltiazem (CARDIZEM) 120 MG tablet TAKE 1 TABLET BY MOUTH ONCE DAILY 12/13/17   Hilty, [name], MD  furosemide (LASIX) 80 MG tablet Take 1 tablet (80 mg total) by mouth 2 (two) times daily. 11/14/19   Hilty, [name], MD  hydrALAZINE (APRESOLINE) 100 MG tablet Take 1 tablet by mouth twice daily 07/20/20   Hilty, [name], MD  metolazone (ZAROXOLYN) 5 MG tablet TAKE 1 TABLET BY MOUTH TWICE A WEEK ON  [facility]  AND  SATURDAYS 03/31/20   Hilty, [name], MD  metoprolol tartrate (LOPRESSOR) 25 MG tablet Take 1 tablet (25 mg total) by mouth 2 (two) times daily. 11/15/19   Kroeger, Lorelee Cover., PA-C  MITIGARE 0.6 MG CAPS  11/07/19   [provider]  potassium chloride SA (KLOR-CON) 20 MEQ tablet TAKE 1  BY MOUTH ONCE DAILY 11/14/19   Hilty, [name], MD  Turmeric (QC TUMERIC COMPLEX PO) Take 1 tablet by mouth daily.    [provider]  valsartan (DIOVAN) 320 MG tablet TAKE 1 TABLET BY MOUTH ONCE DAILY 10/29/19   [provider]  warfarin (COUMADIN) 4 MG tablet TAKE 1 to 2 TABLETS BY MOUTH AS DIRECTED BY COUMADIN  CLINIC 07/21/20   Hilty, [name], MD    Family History Family History  Problem Relation Age of Onset  . Heart disease Father   . Heart failure Father   . Colon polyps Father        unspecified number - pt of intestine surgically resected  . Hypertension Mother   . Diabetes Mother   . Hyperlipidemia Mother   . Colon polyps Mother        unspecified number - pt of intestine surgically resected  . Dementia Mother        d. 45  . Stroke Maternal Grandmother   . Stroke Sister   . Colon cancer Sister 55       w/  "cancerous polyps" - unspecified number; underwent surgery and radiation  . Colon polyps Brother        unspecified number - polypectomies  . Colon cancer Sister        dx 22-60; s/p surgery and radiation  . Bone cancer Maternal Uncle        dx. 13s  . Lung cancer Paternal Grandfather        d. 80s-90s; lung cancer vs TB  . Colon cancer Sister        dx. 80-60; s/p surgery and radiation  . Cancer Maternal Uncle        mother had about 7-8 other siblings, most passed at older ages, many of whom had some type of cancer  . Heart attack Neg Hx  Social History Social History   Tobacco Use  . Smoking status: Former Smoker    Packs/day: 0.10    Years: 2.00    Pack years: 0.20    Types: Cigarettes    Quit date: 12/16/1974    Years since quitting: 45.6  . Smokeless tobacco: Never Used  . Tobacco comment: "1 pack cigarettes would last me a month"  Vaping Use  . Vaping Use: Never used  Substance Use Topics  . Alcohol use: No    Alcohol/week: 0.0 standard drinks    Comment: 10/01/2015 "might have1 6 pack of beer/year"  . Drug use: No    Types: Cocaine, Marijuana    Comment: Hx polysubstance abuse, quit 2008     Allergies   Patient has no known allergies.   Review of Systems Review of Systems  Constitutional: Negative for appetite change, chills, diaphoresis and fever.  Respiratory: Negative for shortness of breath.   Cardiovascular: Negative for chest pain.  Gastrointestinal: Positive for abdominal pain. Negative for blood in stool, constipation, diarrhea, nausea and vomiting.  Genitourinary: Negative for decreased urine volume, difficulty urinating, dysuria, flank pain, frequency, genital sores, hematuria and urgency.  Musculoskeletal: Positive for back pain.  Neurological: Negative for dizziness, weakness and light-headedness.  All other systems reviewed and are negative.    Physical Exam Triage Vital Signs ED Triage Vitals  Enc Vitals Group     BP 08/14/20 1644  92/62     Pulse Rate 08/14/20 1641 70     Resp 08/14/20 1641 (!) 21     Temp 08/14/20 1644 98.6 F (37 C)     Temp src --      SpO2 08/14/20 1641 96 %     Weight --      Height --      Head Circumference --      Peak Flow --      Pain Score 08/14/20 1641 10     Pain Loc --      Pain Edu? --      Excl. in [place]? --    No data found.  Updated Vital Signs BP 92/62   Pulse 70   Temp 98.6 F (37 C)   Resp (!) 21   SpO2 96%   Visual Acuity Right Eye Distance:   Left Eye Distance:   Bilateral Distance:    Right Eye Near:   Left Eye Near:    Bilateral Near:     Physical Exam Vitals reviewed.  Constitutional:      General: He is not in acute distress.    Appearance: Normal appearance. He is not ill-appearing.  HENT:     Head: Normocephalic and atraumatic.  Cardiovascular:     Rate and Rhythm: Normal rate and regular rhythm.     Heart sounds: Normal heart sounds.  Pulmonary:     Effort: Pulmonary effort is normal.     Breath sounds: Normal breath sounds.  Abdominal:     General: Bowel sounds are normal. There is no distension.     Palpations: Abdomen is soft. There is no mass.     Tenderness: There is no abdominal tenderness. There is right CVA tenderness. There is no left CVA tenderness, guarding or rebound. Negative signs include Murphy's sign, Rovsing's sign and McBurney's sign.  Neurological:     General: No focal deficit present.     Mental Status: He is alert and oriented to person, place, and time. Mental status is at baseline.  Psychiatric:  Mood and Affect: Mood normal.        Behavior: Behavior normal.        Thought Content: Thought content normal.        Judgment: Judgment normal.      UC Treatments / Results  Labs (all labs ordered are listed, but only abnormal results are displayed) Labs Reviewed  POCT URINALYSIS DIPSTICK, ED / UC - Abnormal; Notable for the following components:      Result Value   Bilirubin Urine SMALL (*)    Ketones, ur  TRACE (*)    Urobilinogen, UA 2.0 (*)    All other components within normal limits  URINE CULTURE    EKG   Radiology No results found.  Procedures Procedures (including critical care time)  Medications Ordered in UC Medications - No data to display  Initial Impression / Assessment and Plan / UC Course  I have reviewed the triage vital signs and the nursing notes.  Pertinent labs & imaging results that were available during my care of the patient were reviewed by me and considered in my medical decision making (see chart for details).     This is a 70 year old man presenting with 3 days of 10 out of 10 right-sided CVAT.  UA today with small bili, trace ketones, 2.00 bili, negative blood.  On exam, significant right-sided CVAT. afebrile nontachycardic nontachypneic, oxygenating well on room air.   Given fairly normal UA and significant pain, I am sending this patient to [facility] ER for further evaluation and management.  He will be transported in person vehicle by nephew.  Spent over 40 minutes obtaining H&P, performing physical, discussing results, treatment plan and plan for follow-up with patient. Patient agrees with plan.   This chart was dictated using voice recognition software, Dragon. Despite the best efforts of this provider to proofread and correct errors, errors may still occur which can change documentation meaning.   Final Clinical Impressions(s) / UC Diagnoses   Final diagnoses:  Kidney pain     Discharge Instructions     Head straight to [facility] ED for further evaluation of back pain.   If you develop chest pain, dizziness, weakness, headaches, etc. while on the way to the ED- immediately call EMS.      ED Prescriptions    None     PDMP not reviewed this encounter.   Hazel Sams, PA-C 08/15/20 1006

## 2020-08-14 NOTE — ED Notes (Signed)
Patient is being discharged from the Urgent Care and sent to the Emergency Department via [facility] with nephew . Per Olene Craven, PA, patient is in need of higher level of care due to pos. Patient is aware and verbalizes understanding of plan of care.  Vitals:   08/14/20 1641 08/14/20 1644  BP:  92/62  Pulse: 70   Resp: (!) 21   Temp:  98.6 F (37 C)  SpO2: 96%

## 2020-08-14 NOTE — Discharge Instructions (Addendum)
Head straight to [facility] ED for further evaluation of back pain.   If you develop chest pain, dizziness, weakness, headaches, etc. while on the way to the ED- immediately call EMS.

## 2020-08-15 ENCOUNTER — Emergency Department (HOSPITAL_COMMUNITY): Payer: Medicare HMO

## 2020-08-15 IMAGING — CT CT RENAL STONE PROTOCOL
2 of 4 series · 15 of 46 positions shown, 17 images · non-contrast
Comparison: CT lumbar spine today reported separately. CT Chest,
Abdomen, and Pelvis [DATE].

CLINICAL DATA: 69-year-old male with flank pain, right back pain
for 3 days. Dark urine. History of colon cancer.

EXAM:
CT ABDOMEN AND PELVIS WITHOUT CONTRAST
TECHNIQUE: Multidetector CT imaging of the abdomen and pelvis was performed
following the standard protocol without IV contrast.

[Series 3: renal stone 5.0 · axial · 0.97mm/px · z∈[+934,+1359]mm · 12 of 97 slices shown, 14 images]
[im 8/97  soft-tissue]
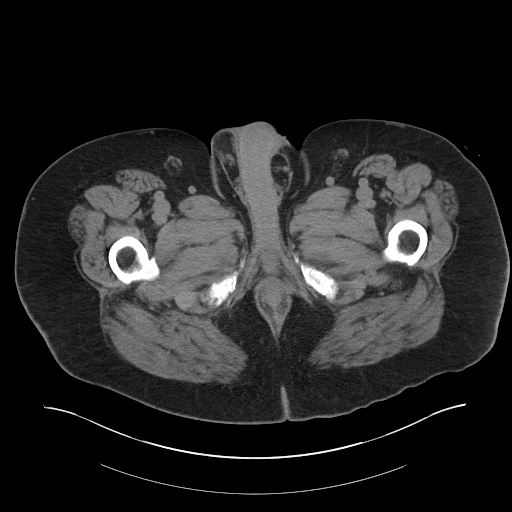
[im 8/97  bone]
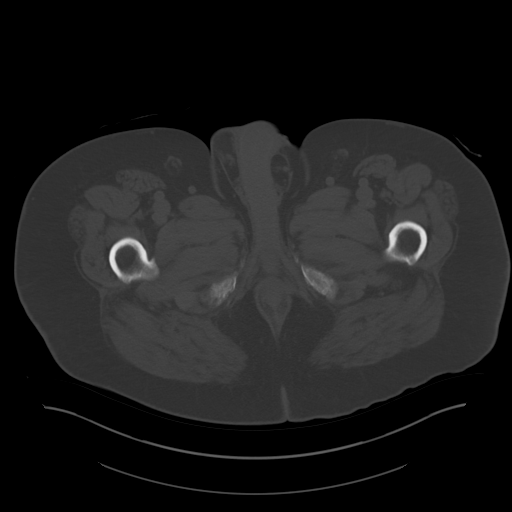
[im 16/97  soft-tissue]
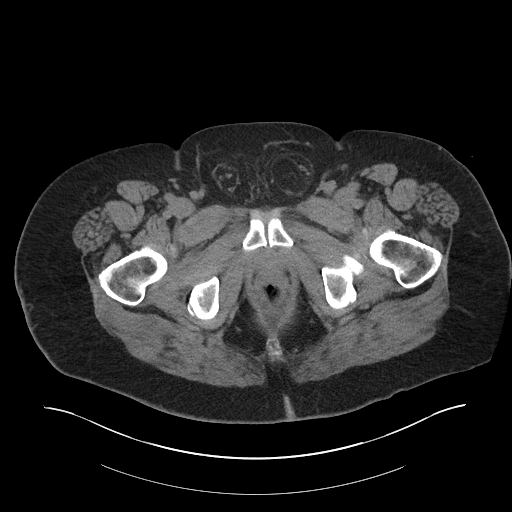
[im 24/97  soft-tissue]
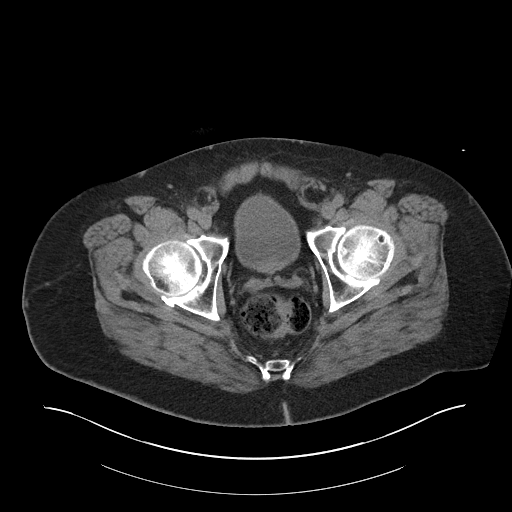
[im 31/97  soft-tissue]
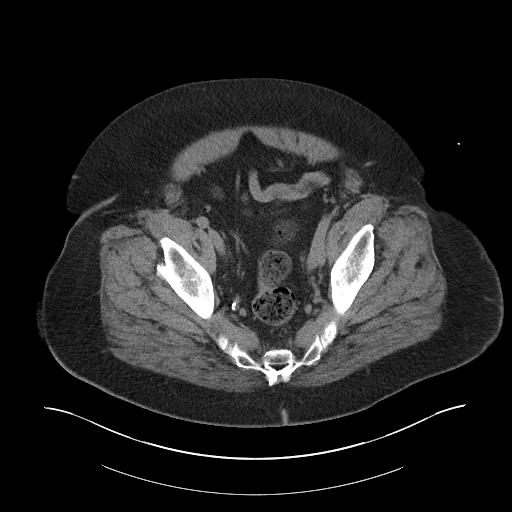
[im 39/97  soft-tissue]
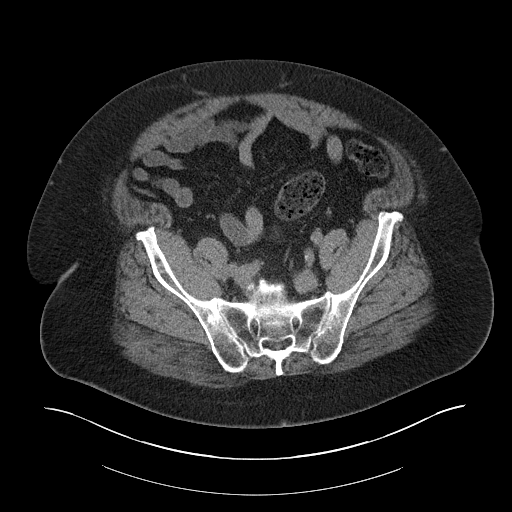
[im 47/97  soft-tissue]
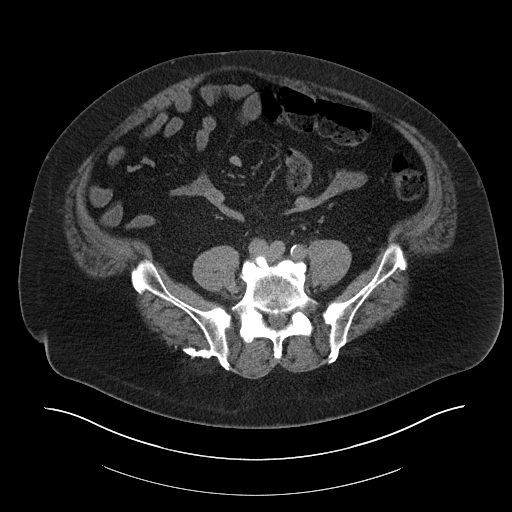
[im 54/97  soft-tissue]
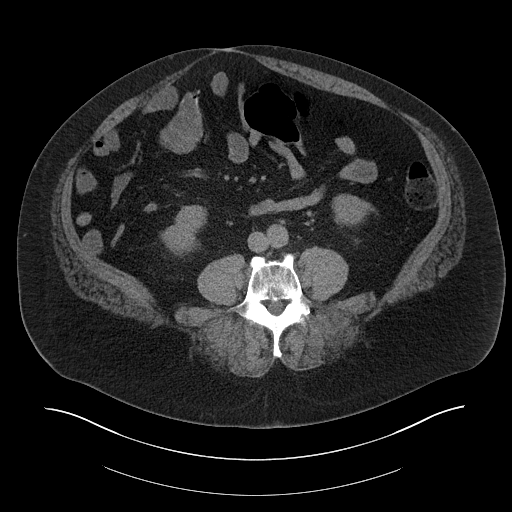
[im 62/97  soft-tissue]
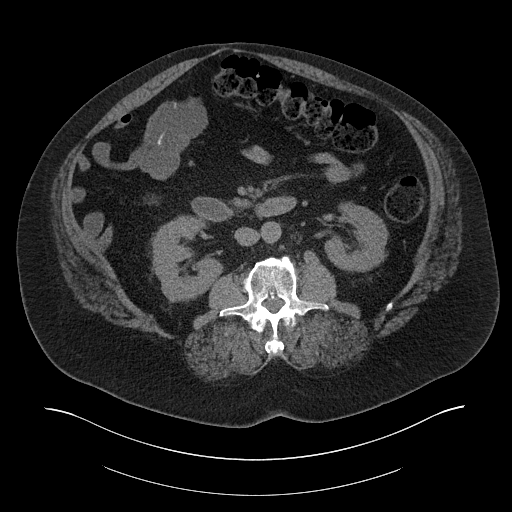
[im 70/97  soft-tissue]
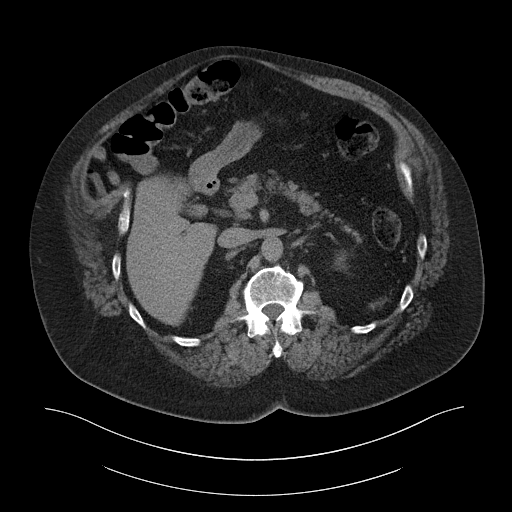
[im 70/97  bone]
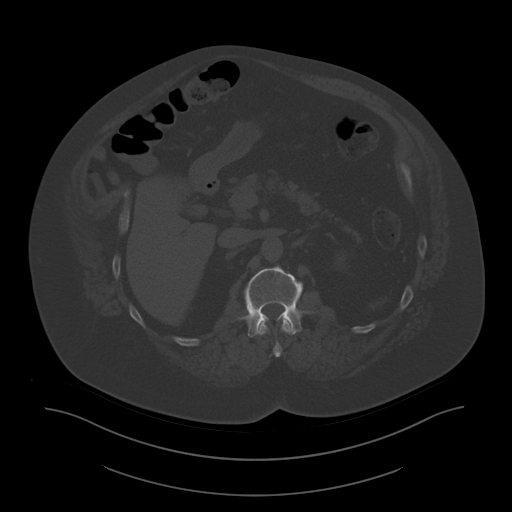
[im 77/97  soft-tissue]
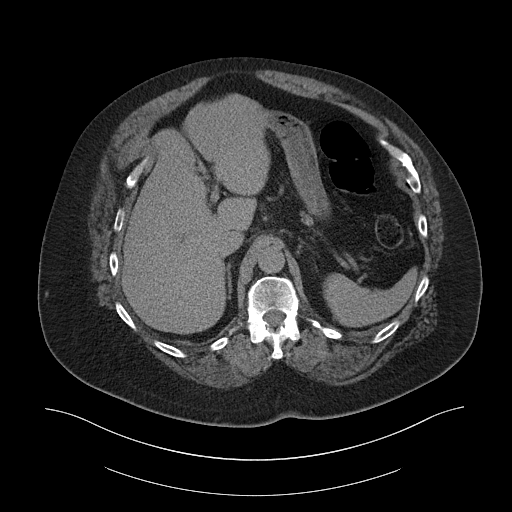
[im 85/97  soft-tissue]
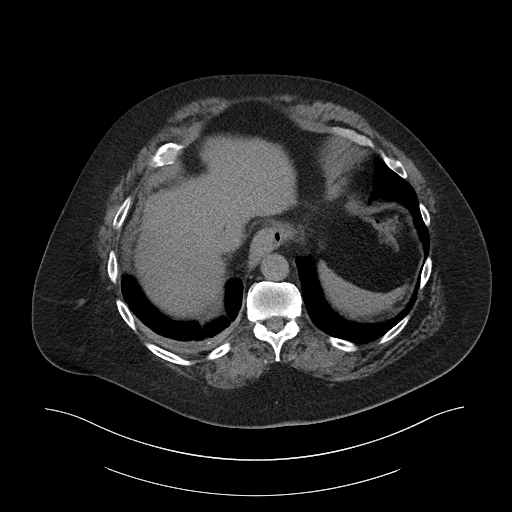
[im 93/97  soft-tissue]
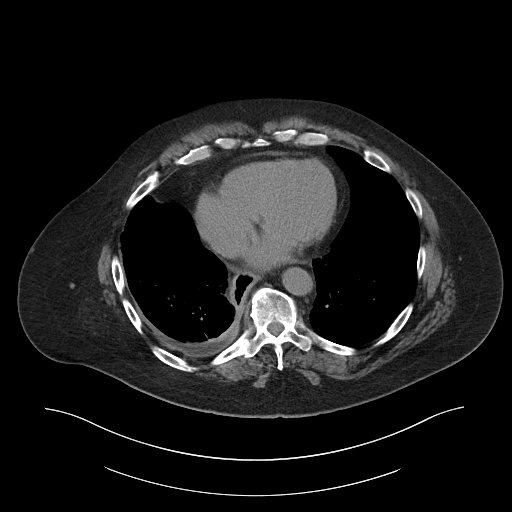

[Series 6: coronal · coronal · 0.95mm/px · 3 of 156 slices shown]
[im 52/156  soft-tissue]
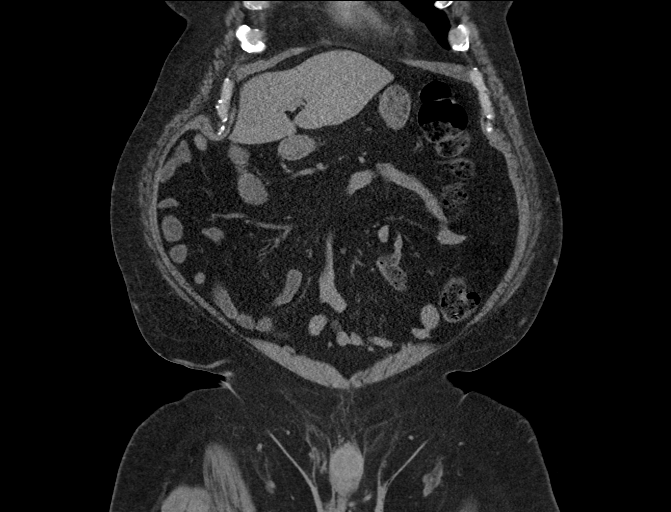
[im 69/156  soft-tissue]
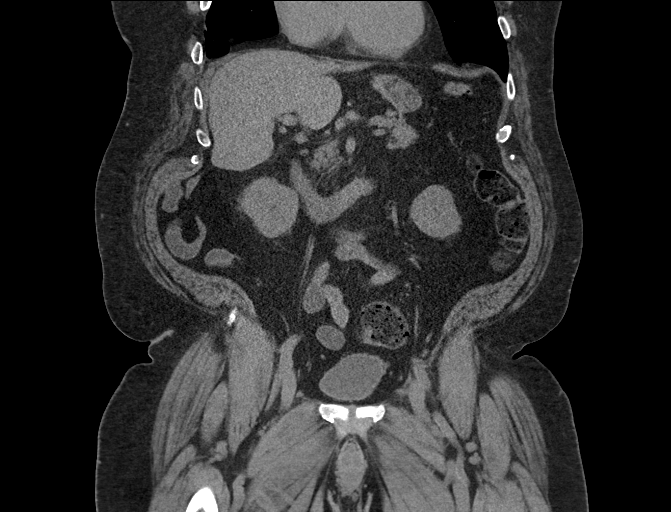
[im 87/156  soft-tissue]
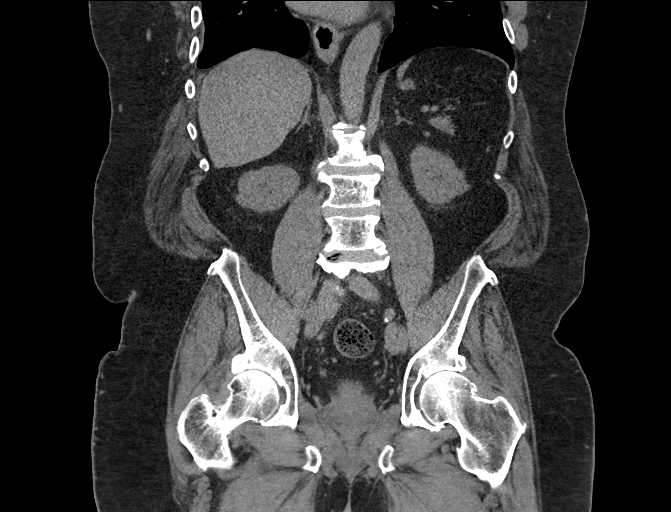

[15 of 46 positions shown; findings below may reference images not displayed]

FINDINGS: Lower chest: Chronic fibrothorax in the right lower lung with
pleural thickening and trace fluid is stable since [DATE]. No
acute finding at the lung bases. There is chronic traction on the
distal thoracic esophagus. Cardiac size remains normal. No
pericardial effusion.

Hepatobiliary: Negative noncontrast liver and gallbladder.

Pancreas: Stable, negative.

Spleen: Negative.

Adrenals/Urinary Tract: Normal adrenal glands.

Kidneys appear stable since [Y7], mild chronic perinephric
stranding. No hydronephrosis. No nephrolithiasis. Both ureters are
decompressed and normal. Diminutive and unremarkable urinary
bladder. No perivesical stranding.

Stomach/Bowel: Mild stool ball in the rectum, similar retained stool
in redundant sigmoid colon. Low-density retained stool also
intermittently in the descending and transverse colon.

Small to large bowel anastomosis in the right upper abdomen appears
stable with no adverse features. No large bowel wall thickening. No
dilated small bowel. Decompressed stomach. No free air, free fluid,
mesenteric stranding.

Vascular/Lymphatic: Mild Aortoiliac calcified atherosclerosis.
Normal caliber abdominal aorta. Vascular patency is not evaluated in
the absence of IV contrast.

No lymphadenopathy.

Reproductive: Chronic small fat containing inguinal hernias.
Otherwise negative.

Other: No pelvic free fluid.

Musculoskeletal: Lumbar spine detailed separately today. No acute or
suspicious osseous lesion identified.
IMPRESSION: 1. No acute or inflammatory process identified in the non-contrast
abdomen or pelvis. No urinary calculus or acute uropathy. No
metastatic disease is evident.
2. Lumbar spine CT detailed separately.
3. Chronic fibrothorax and associated sequelae in the right lower
lung are stable since [DATE].
[DATE]. Aortic Atherosclerosis ([Y7]-[Y7]).

## 2020-08-15 IMAGING — MR MR LUMBAR SPINE WO/W CM
4 of 7 series · 24 of 48 positions shown · IV contrast (gadavist)
Comparison: CT scan, same date.

CLINICAL DATA: Low back pain and right-sided leg pain for the past
few days. Abnormal CT scan. History of colon cancer.

EXAM:
MRI LUMBAR SPINE WITHOUT AND WITH CONTRAST
TECHNIQUE: Multiplanar and multiecho pulse sequences of the lumbar spine were
obtained without and with intravenous contrast.
CONTRAST:  10mL GADAVIST GADOBUTROL 1 MMOL/ML IV SOLN

[Series 5: T2 · sagittal · 4.0mm · 0.73mm/px · 4 of 15 slices shown (1 of 2)]
[im 1/15]
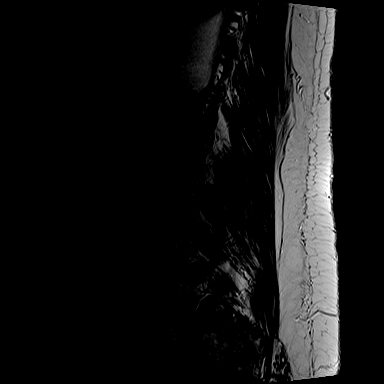
[im 5/15]
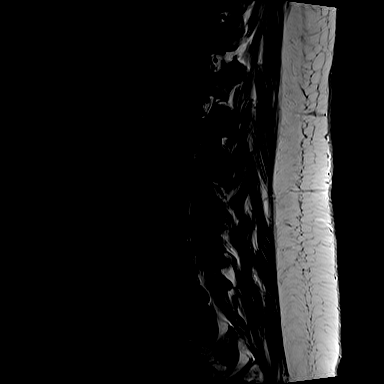
[im 10/15]
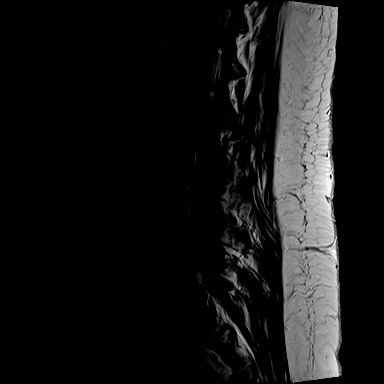
[im 15/15]
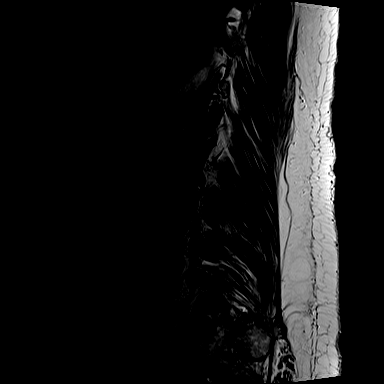

[Series 7: T1 · sagittal · 4.0mm · 0.88mm/px · 5 of 15 slices shown (1 of 2)]
[im 1/15]
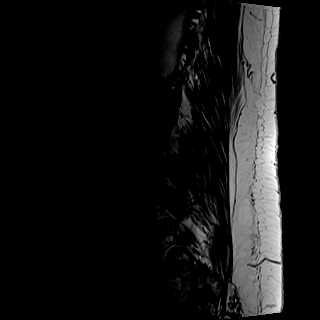
[im 4/15]
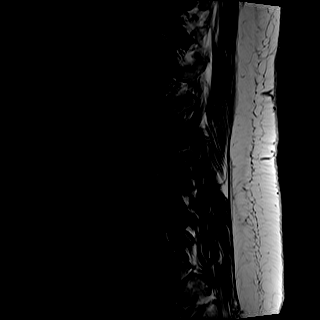
[im 8/15]
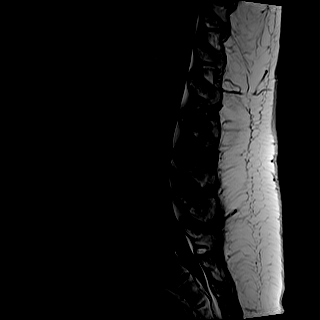
[im 11/15]
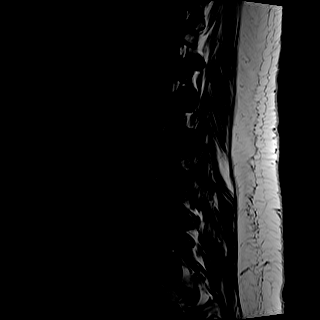
[im 15/15]
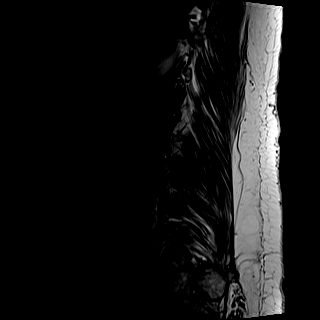

[Series 8: T2 · axial · 4.0mm · 0.57mm/px · z∈[-112,+84]mm · 8 of 32 slices shown (2 of 2)]
[im 1/32]
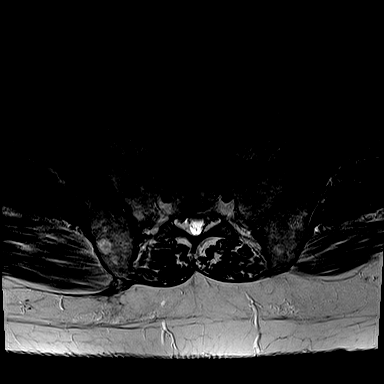
[im 4/32]
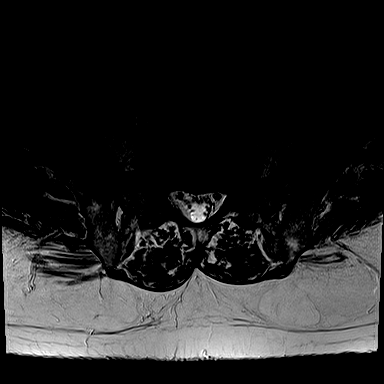
[im 11/32]
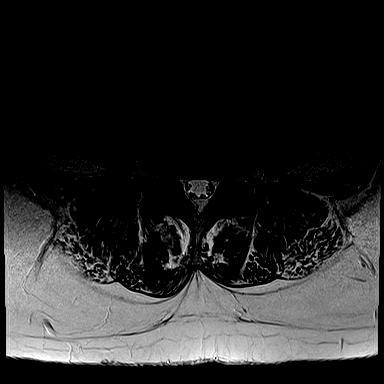
[im 14/32]
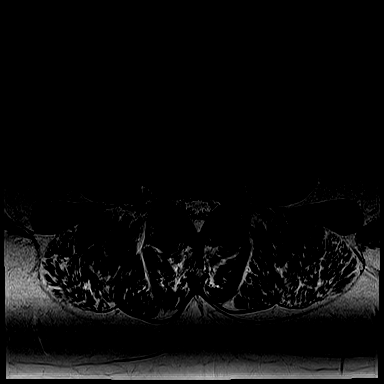
[im 18/32]
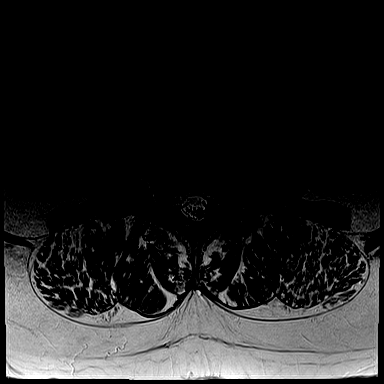
[im 21/32]
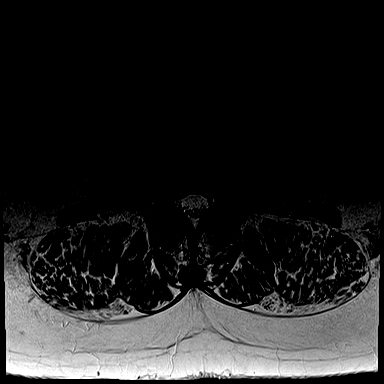
[im 28/32]
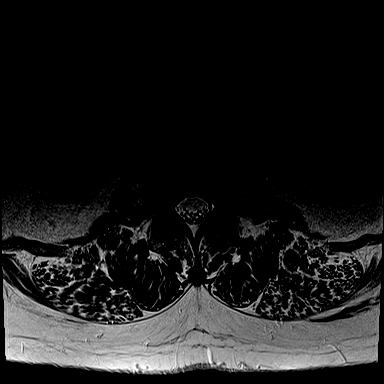
[im 32/32]
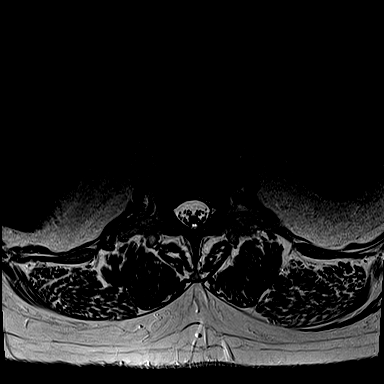

[Series 9: T1 · axial · 4.0mm · 0.34mm/px · z∈[-112,+65]mm · 7 of 32 slices shown (2 of 2)]
[im 1/32]
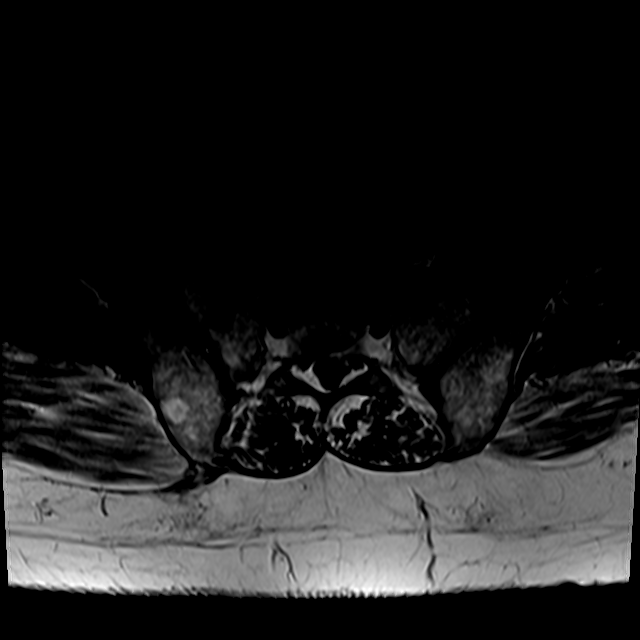
[im 4/32]
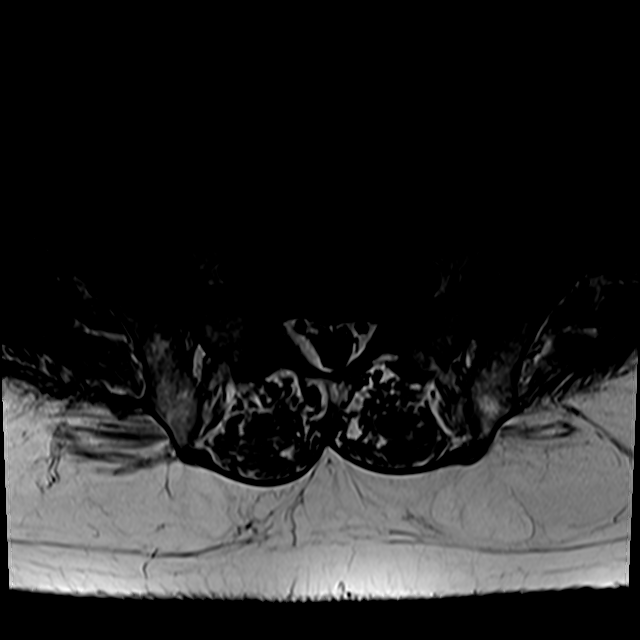
[im 11/32]
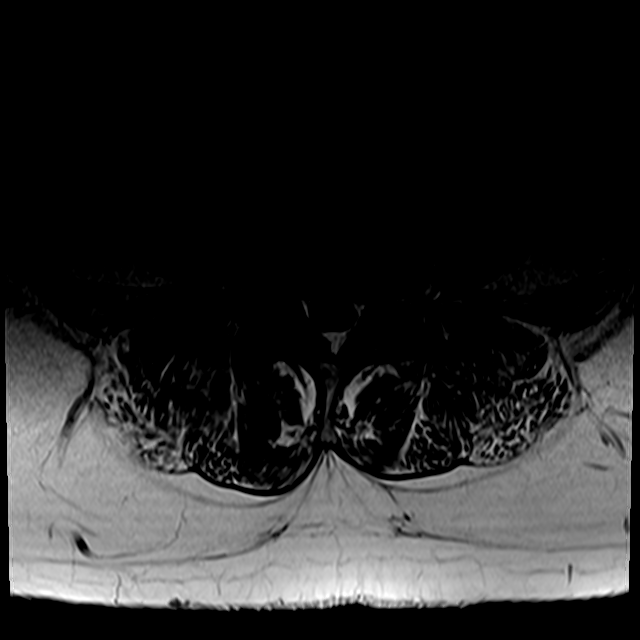
[im 14/32]
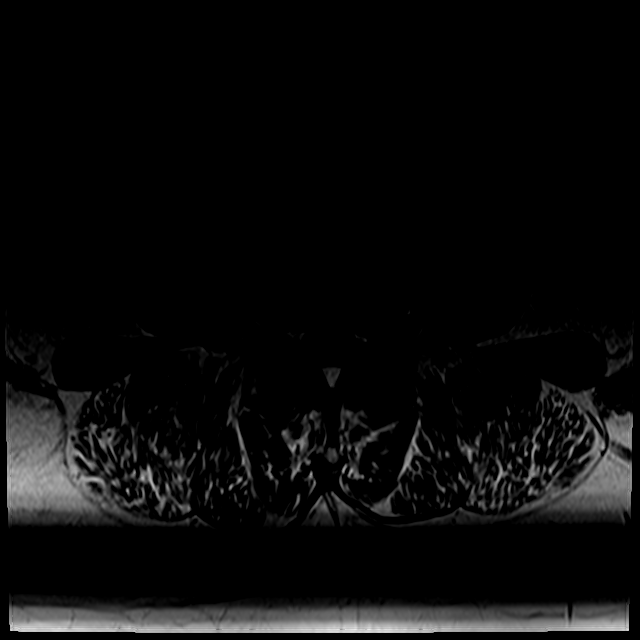
[im 18/32]
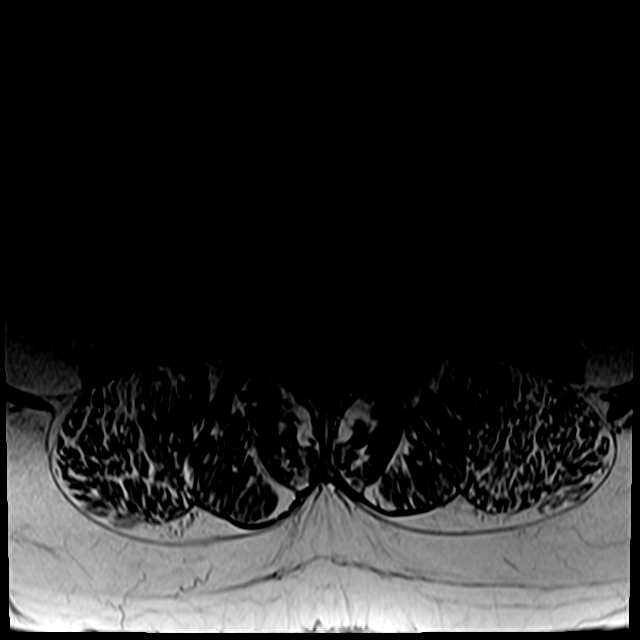
[im 21/32]
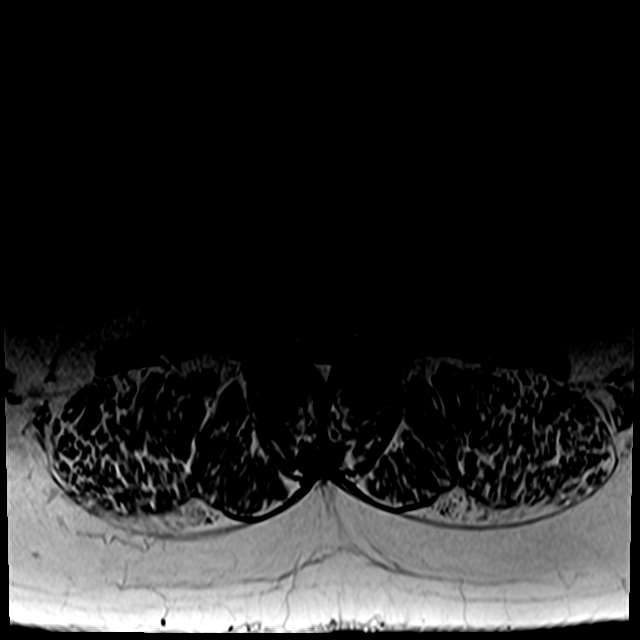
[im 28/32]
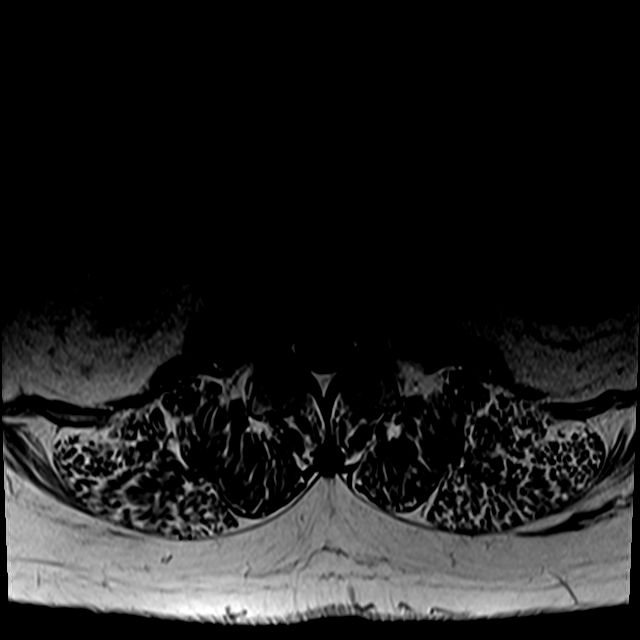

[24 of 48 positions shown; findings below may reference images not displayed]

FINDINGS: Segmentation: There are five lumbar type vertebral bodies. The last
full intervertebral disc space is labeled L5-S1. This correlates
with the CT scan.

Alignment:  Normal

Vertebrae: Normal marrow signal. No bone lesions or fractures. No
findings suspicious for osseous metastatic disease. A few small
hemangiomas are noted. Probable vascular/atypical hemangioma T12.

Conus medullaris and cauda equina: Conus extends to the bottom of L1
level. Conus and cauda equina appear normal.

Paraspinal and other soft tissues: No significant paraspinal or
retroperitoneal findings.

Disc levels:

L1-2: No significant findings.

L2-3: Mild facet disease but no disc protrusions, spinal or
foraminal stenosis.

L3-4: As demonstrated on the CT scan there is a right-sided synovial
cyst measuring a maximum 7.5 mm. There is mass effect on the
posterolateral aspect of the thecal sac. Mild to moderate facet
disease but no disc protrusions or significant foraminal stenosis.

L4-5: Moderate facet disease but no disc protrusions, spinal or
foraminal stenosis.

L5-S1: Bulging annulus, mild osteophytic ridging and mild facet
disease but no disc protrusions, significant spinal or foraminal
stenosis.
IMPRESSION: 1. 7.5 mm right-sided synovial cyst at L3-4 with mild mass effect on
the posterolateral aspect of the thecal sac.
2. No disc protrusions, spinal or foraminal stenosis.
3. No findings suspicious for osseous metastatic disease.

## 2020-08-15 IMAGING — CT CT L SPINE W/O CM
3 series · 11 of 33 positions shown, 13 images · IV contrast (agent unspecified)
Comparison: CT Abdomen and Pelvis today are reported separately. CT
Chest, Abdomen, and Pelvis [DATE].

CLINICAL DATA: 69-year-old male with flank pain, right back pain
for 3 days. Dark urine. History of colon cancer.

EXAM:
CT LUMBAR SPINE WITHOUT CONTRAST
TECHNIQUE: 
TECHNIQUE: Multiplanar CT images of the lumbar spine were
reconstructed from contemporary CT of the Abdomen and Pelvis.
CONTRAST:  None

[Series 1: lspine · axial · 0.50mm/px · z∈[+1109,+1301]mm · 3 of 157 slices shown, 4 images (1 of 3)]
[im 37/157  soft-tissue]
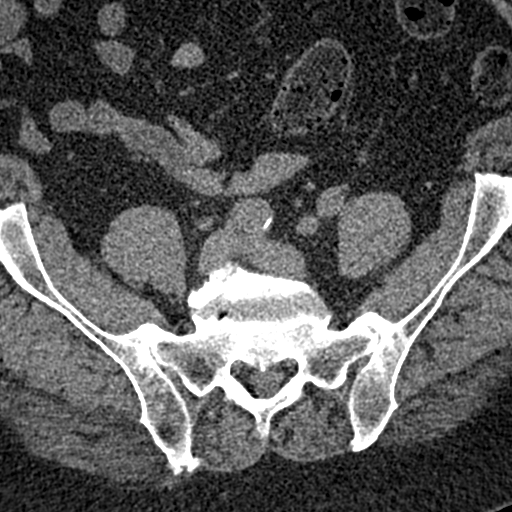
[im 37/157  bone]
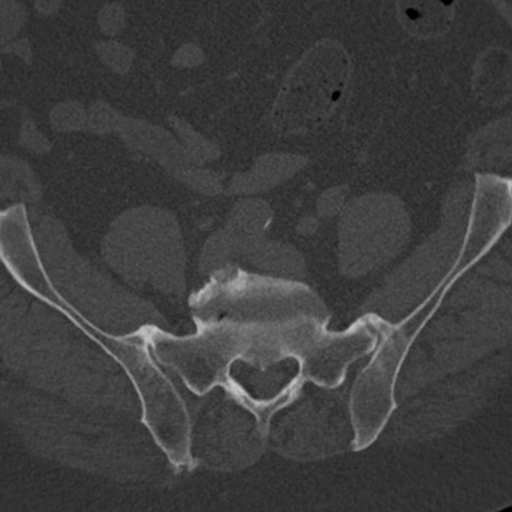
[im 85/157  bone]
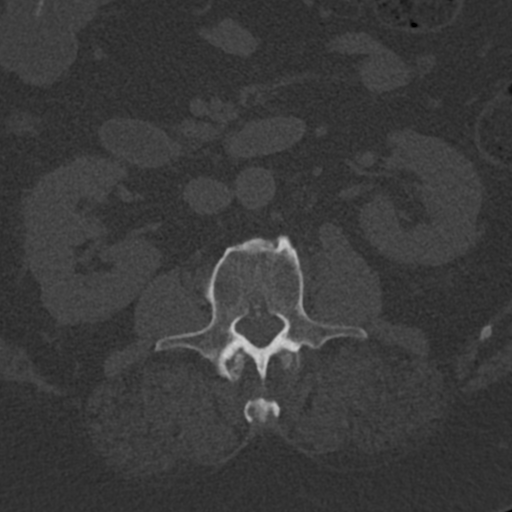
[im 133/157  bone]
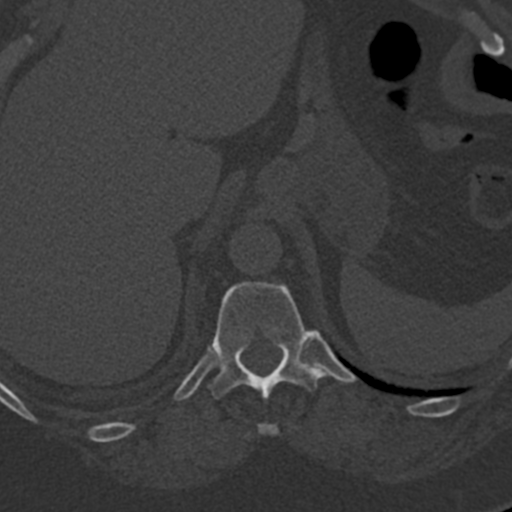

[Series 6: lspine · coronal · 0.50mm/px · 3 of 128 slices shown (2 of 3)]
[im 26/128  bone]
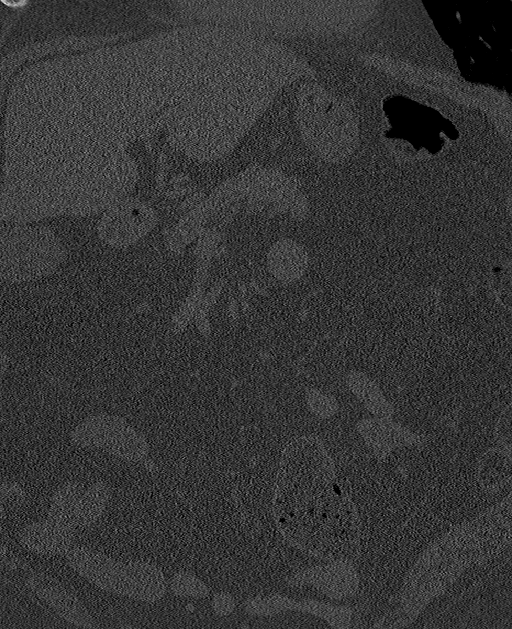
[im 51/128  bone]
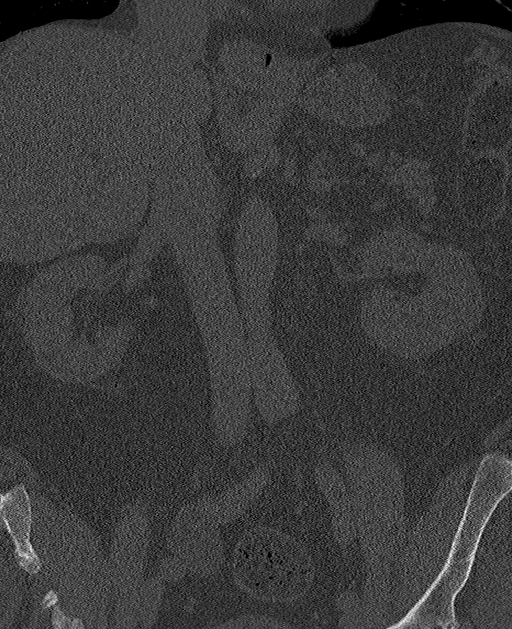
[im 77/128  bone]
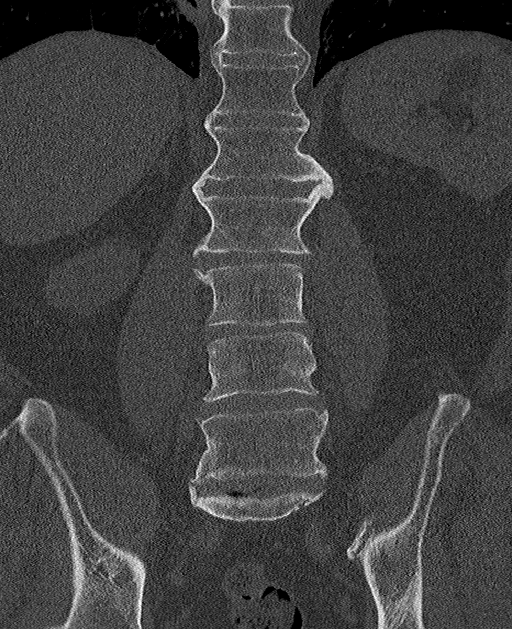

[Series 8: lspine · sagittal · 0.37mm/px · 5 of 128 slices shown, 6 images (3 of 3)]
[im 43/128  bone]
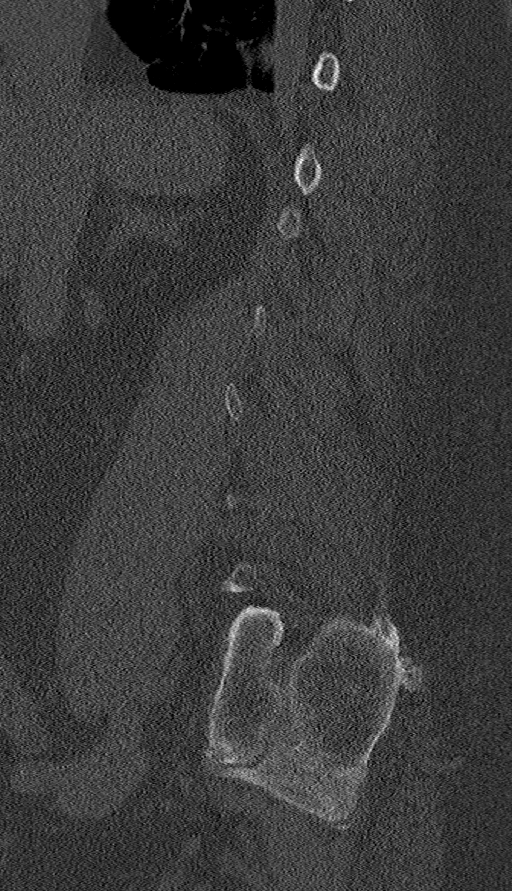
[im 53/128  bone]
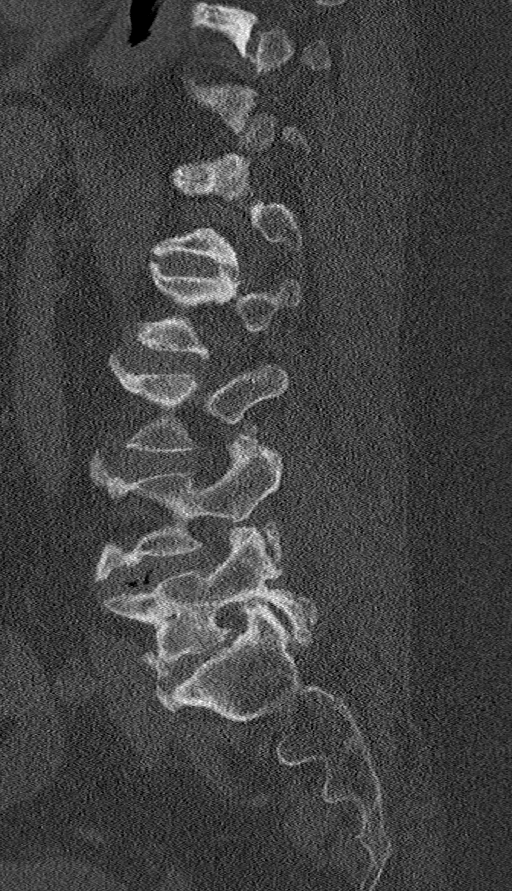
[im 64/128  soft-tissue]
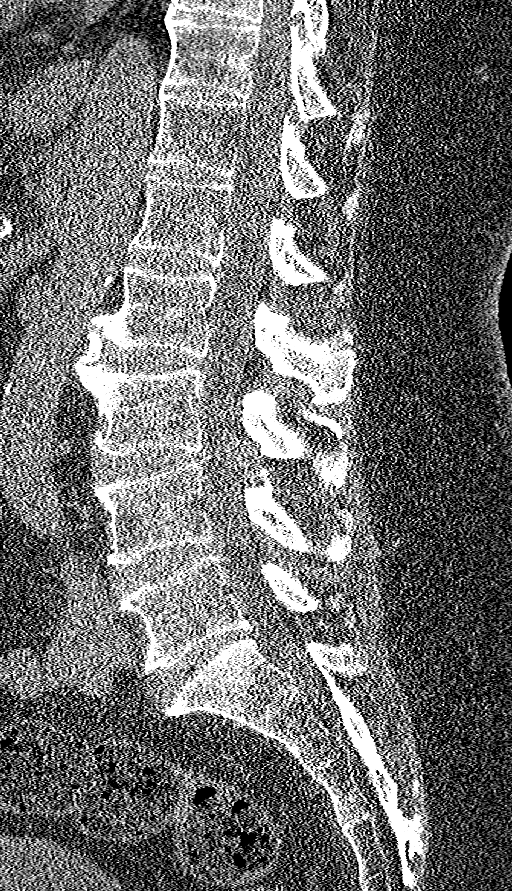
[im 64/128  bone]
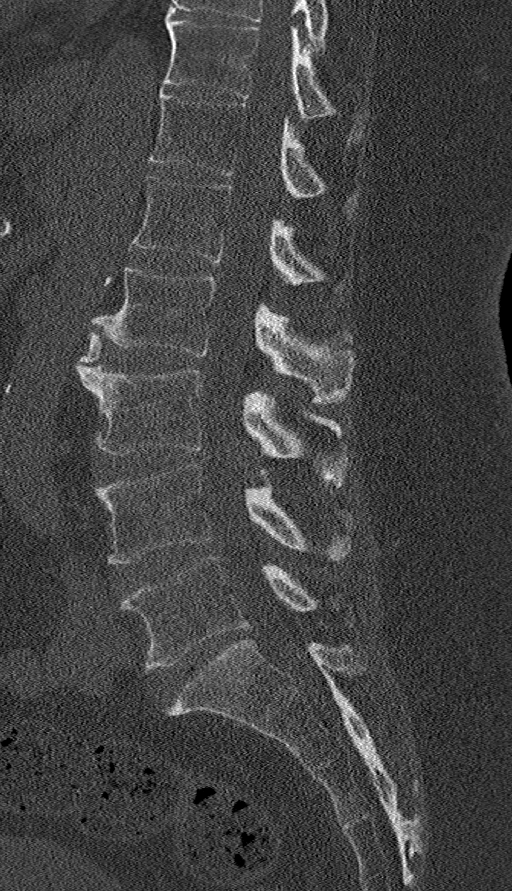
[im 75/128  bone]
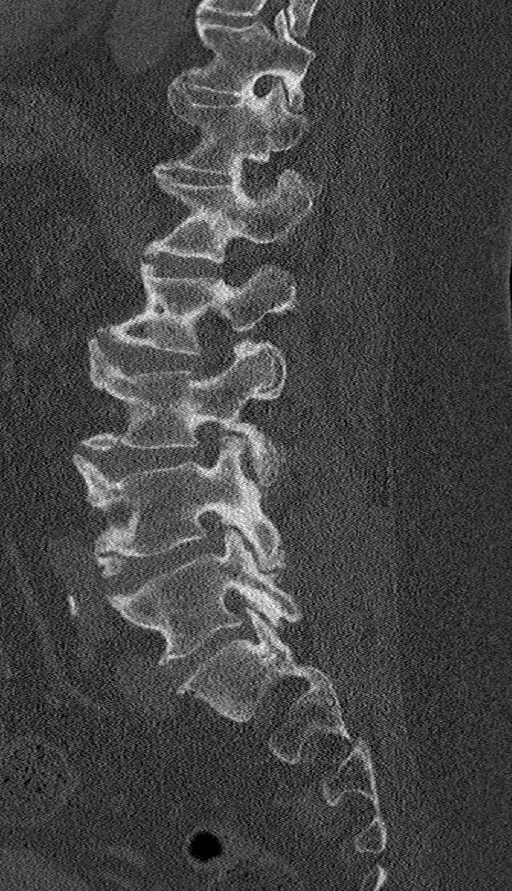
[im 85/128  bone]
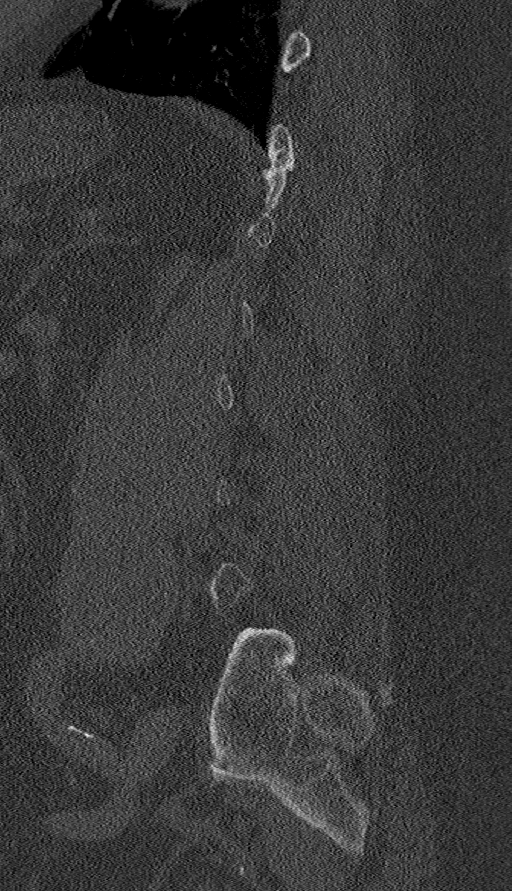

[11 of 33 positions shown; findings below may reference images not displayed]

FINDINGS: Segmentation: Normal.

Alignment: Stable since [BZ]. Preserved lumbar lordosis. Subtle
retrolisthesis of L5 on S1 is unchanged.

Vertebrae: Visible bone mineralization stable and within normal
limits. No acute osseous abnormality identified. Intact visible
sacrum and SI joints.

Paraspinal and other soft tissues: Abdominal and pelvic viscera
today are reported separately. Lumbar paraspinal soft tissues are
within normal limits. There is chronic dystrophic calcification
along the right gluteal fascia, unchanged.

Disc levels:

Ankylosis of the visible lower thoracic spine related to flowing
endplate osteophytes or syndesmophytes.

That ankylosis continues to the L1-L2 level.

L2-L3: Superimposed bulky anterior endplate osteophytosis at L2-L3
and mild facet hypertrophy but no ankylosis at that level. No
significant stenosis.

L3-L4: Circumferential disc bulge and endplate spurring at L3-L4
with moderate facet hypertrophy. New oval space-occupying mass in
the right lateral spinal canal at that level (series 1, image 82)
when compared to [BZ] measures about 13 mm length by 7 mm short axis
and is most likely a degenerative synovial cyst. Severe right
lateral recess stenosis. Up to moderate new spinal stenosis.

Moderate L3 neural foraminal stenosis appears primarily
pre-existing, stable.

L4-L5: Comparatively mild disc bulging. Anterior endplate spurring.
Mild to moderate facet hypertrophy. No spinal stenosis. Moderate L4
foraminal stenosis appears chronic and stable.

L5-S1: Chronic disc space loss. Mostly far lateral disc osteophyte
complex. Mild to moderate facet hypertrophy. No spinal stenosis.
Mild to moderate L5 foraminal stenosis is greater on the right,
chronic and stable.
IMPRESSION: 1. Symptomatic abnormality suspected to be at L3-L4, and most likely
a bulky degenerative Synovial Cyst which is new since [BZ]. Severe
right lateral recess stenosis and up to moderate spinal stenosis.
Query right L4 radiculitis. MRI lumbar spine (without and with
contrast preferred given history of colon cancer) would confirm.

2. Elsewhere lumbar spine degeneration is stable since [BZ], with
chronic neural foraminal stenosis but no other significant spinal
stenosis. There is spinal ankylosis from the lower thoracic levels
to L2.

## 2020-08-15 MED ORDER — MORPHINE SULFATE (PF) 4 MG/ML IV SOLN
4.0000 mg | Freq: Once | INTRAVENOUS | Status: AC
  Administered 2020-08-15: 4 mg via INTRAVENOUS
  Filled 2020-08-15: qty 1

## 2020-08-15 MED ORDER — DEXAMETHASONE SODIUM PHOSPHATE 10 MG/ML IJ SOLN
10.0000 mg | Freq: Once | INTRAMUSCULAR | Status: AC
  Administered 2020-08-15: 10 mg via INTRAVENOUS
  Filled 2020-08-15: qty 1

## 2020-08-15 MED ORDER — PREDNISONE 10 MG (21) PO TBPK: ORAL_TABLET | ORAL | 0 refills | Status: DC

## 2020-08-15 MED ORDER — GADOBUTROL 1 MMOL/ML IV SOLN
10.0000 mL | Freq: Once | INTRAVENOUS | Status: AC | PRN
  Administered 2020-08-15: 10 mL via INTRAVENOUS

## 2020-08-15 MED ORDER — HYDROCODONE-ACETAMINOPHEN 5-325 MG PO TABS: 1.0000 | ORAL_TABLET | ORAL | 0 refills | Status: DC | PRN

## 2020-08-15 MED ORDER — SODIUM CHLORIDE 0.9 % IV BOLUS
1000.0000 mL | Freq: Once | INTRAVENOUS | Status: AC
  Administered 2020-08-15: 1000 mL via INTRAVENOUS

## 2020-08-15 MED ORDER — ACETAMINOPHEN 500 MG PO TABS
1000.0000 mg | ORAL_TABLET | Freq: Once | ORAL | Status: DC
  Filled 2020-08-15: qty 2

## 2020-08-15 MED ORDER — ONDANSETRON HCL 4 MG/2ML IJ SOLN
4.0000 mg | Freq: Once | INTRAMUSCULAR | Status: AC
  Administered 2020-08-15: 4 mg via INTRAVENOUS
  Filled 2020-08-15: qty 2

## 2020-08-15 NOTE — ED Provider Notes (Signed)
Pt signed out by Dr. Tyrone Nine pending MRI Lumbar spine.      IMPRESSION:  1. 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  2. No disc protrusions, spinal or foraminal stenosis.  3. No findings suspicious for osseous metastatic disease.   Urine negative from yesterday.  Culture pending.    PT does have a mild AKI.  He is given IVFs.  He is told to decrease his lasix and have his pcp recheck his bmp.  He is to f/u with NS.    Pt to return if worse.   Isla Pence, MD 08/15/20 1032

## 2020-08-15 NOTE — ED Notes (Signed)
Pt discharge information provided. Pt asking to speak with provider. Gilford Raid, MD made aware.

## 2020-08-15 NOTE — ED Notes (Signed)
Patient transported to MRI 

## 2020-08-15 NOTE — Discharge Instructions (Signed)
Your kidney function is slightly abnormal today.  This is likely from dehydration or medications.  Your primary care provider will need to recheck your kidney function in about 1 week.  Decrease your lasix to 40 mg twice a day.

## 2020-08-15 NOTE — ED Provider Notes (Signed)
[facility] EMERGENCY DEPARTMENT Provider Note   CSN: [id number] Arrival date & time: 08/14/20  1803     History Chief Complaint  Patient presents with  . Back Pain    Steven Barnett is a 70 y.o. male.  70 yo M with a chief complaints of right-sided low back pain.  This been going on for a few days.  Worse with movement and twisting ambulation.  Started when he went to go watch one of his grandchildren play basketball.  He was sitting in the bleachers and was having some trouble getting up and down.  He denies trauma.  Tells me he thinks his kidneys are hurting him.  He has been drinking lots of fluids thinking that might make it better.  Denies urinary symptoms.  Went to urgent care and had a urine test and that was sent here for evaluation.  No prior history of back pain.  Remote history of colon cancer status post resection.  Denies recent surgery or instrumentation to the back.  Denies loss of bowel or bladder deniesa sensation denies fever.  Denies radiation of the pain.  Denies numbness or weakness of the leg.   Back Pain Location:  Lumbar spine Quality:  Aching, stabbing and shooting Radiates to:  Does not radiate Pain severity:  Moderate Onset quality:  Gradual Duration:  4 days Timing:  Intermittent Progression:  Worsening Chronicity:  New Relieved by: tylenol. Worsened by:  Nothing Ineffective treatments:  None tried Associated symptoms: no abdominal pain, no chest pain, no fever and no headaches        Past Medical History:  Diagnosis Date  . Anemia   . Arthralgia of both knees   . Arthritis    "right knee" (10/01/2015)  . Atrial fibrillation ([place])   . Cancer (HCC)    STAGE 1 COLON CANCER  . Chronic anticoagulation 2010   Coumadin  . Chronic diastolic CHF (congestive heart failure), NYHA class 2 ([place])   . Dyspnea   . Dysrhythmia   . Hypertension   . OSA (obstructive sleep apnea) 2016   "couldn't take the mask during the testing"  (10/01/2015)  . Permanent atrial fibrillation ([place]) 2010  . Pneumonia 3/29/2017and june 2017    Patient Active Problem List   Diagnosis Date Noted  . Encounter for therapeutic drug monitoring 10/12/2018  . Hypotension 08/17/2016  . SIRS (systemic inflammatory response syndrome) ([place]) 08/17/2016  . Influenza A 08/17/2016  . Septic shock ([place]) 08/10/2016  . Neutropenia, drug-induced ([place])   . Gastroenteritis due to norovirus   . AKI (acute kidney injury) ([place])   . Genetic testing 07/01/2016  . Port catheter in place 06/29/2016  . Family history of colon cancer 06/08/2016  . Family history of colonic polyps 06/08/2016  . Cancer of right colon ([place]) 04/12/2016  . Pre-operative cardiovascular examination 04/06/2016  . CAP (community acquired pneumonia) 02/02/2016  . Acute on chronic diastolic heart failure ([place]) 02/02/2016  . Acute respiratory failure with hypoxia ([place]) 02/02/2016  . Anemia, iron deficiency 02/02/2016  . Current use of long term anticoagulation 12/03/2015  . Pneumonia 09/30/2015  . PNA (pneumonia) 09/30/2015  . Acute on chronic congestive heart failure ([place])   . CHF exacerbation ([place]) 07/08/2015  . Community acquired pneumonia 02/10/2015  . ED (erectile dysfunction) 08/12/2014  . Arthralgia of both knees 04/08/2014  . Chronic diastolic (congestive) heart failure ([place]) 01/14/2009  . Morbid obesity ([place]) 12/31/2008  . Obstructive sleep apnea 12/31/2008  . Essential hypertension,  benign 12/31/2008  . Permanent atrial fibrillation ([place]) 12/31/2008    Past Surgical History:  Procedure Laterality Date  . COLONOSCOPY WITH PROPOFOL N/A 03/04/2016   Procedure: COLONOSCOPY WITH PROPOFOL;  Surgeon: Carol Ada, MD;  Location: [facility];  Service: Endoscopy;  Laterality: N/A;  . COLONOSCOPY WITH PROPOFOL N/A 09/15/2017   Procedure: COLONOSCOPY WITH PROPOFOL;  Surgeon: Carol Ada, MD;  Location: [facility];  Service: Endoscopy;  Laterality: N/A;  . LAPAROSCOPIC  PARTIAL COLECTOMY N/A 04/12/2016   Procedure: LAPAROSCOPIC ILEOCOLECTOMY;  Surgeon: [name], MD;  Location: [facility];  Service: General;  Laterality: N/A;  . PORT-A-CATH REMOVAL N/A 02/16/2017   Procedure: REMOVAL PORT-A-CATH;  Surgeon: [name], MD;  Location: [facility];  Service: General;  Laterality: N/A;  . PORTACATH PLACEMENT N/A 05/18/2016   Procedure: INSERTION PORT-A-CATH;  Surgeon: [name], MD;  Location: [facility];  Service: General;  Laterality: N/A;  . THORACOTOMY Right 2010       Family History  Problem Relation Age of Onset  . Heart disease Father   . Heart failure Father   . Colon polyps Father        unspecified number - pt of intestine surgically resected  . Hypertension Mother   . Diabetes Mother   . Hyperlipidemia Mother   . Colon polyps Mother        unspecified number - pt of intestine surgically resected  . Dementia Mother        d. 22  . Stroke Maternal Grandmother   . Stroke Sister   . Colon cancer Sister 37       w/ "cancerous polyps" - unspecified number; underwent surgery and radiation  . Colon polyps Brother        unspecified number - polypectomies  . Colon cancer Sister        dx 25-60; s/p surgery and radiation  . Bone cancer Maternal Uncle        dx. 18s  . Lung cancer Paternal Grandfather        d. 80s-90s; lung cancer vs TB  . Colon cancer Sister        dx. 31-60; s/p surgery and radiation  . Cancer Maternal Uncle        mother had about 7-8 other siblings, most passed at older ages, many of whom had some type of cancer  . Heart attack Neg Hx     Social History   Tobacco Use  . Smoking status: Former Smoker    Packs/day: 0.10    Years: 2.00    Pack years: 0.20    Types: Cigarettes    Quit date: 12/16/1974    Years since quitting: 45.6  . Smokeless tobacco: Never Used  . Tobacco comment: "1 pack cigarettes would last me a month"  Vaping Use  . Vaping Use: Never used  Substance Use Topics  . Alcohol use:  No    Alcohol/week: 0.0 standard drinks    Comment: 10/01/2015 "might have1 6 pack of beer/year"  . Drug use: No    Types: Cocaine, Marijuana    Comment: Hx polysubstance abuse, quit 2008    Home Medications Prior to Admission medications   Medication Sig Start Date End Date Taking? Authorizing Provider  acetaminophen (TYLENOL) 500 MG tablet Take 1,000 mg by mouth 2 (two) times daily.    [provider]  albuterol (PROVENTIL HFA;VENTOLIN HFA) 108 (90 Base) MCG/ACT inhaler Inhale 2 puffs into the lungs every 6 (six) hours as needed  for wheezing or shortness of breath. 10/01/15   Nita Sells, MD  aspirin EC 81 MG tablet Take 81 mg by mouth daily.    [provider]  colchicine 0.6 MG tablet Take 0.6 mg by mouth 2 (two) times daily.    [provider]  diltiazem (CARDIZEM) 120 MG tablet TAKE 1 TABLET BY MOUTH ONCE DAILY 12/13/17   Hilty, [name], MD  furosemide (LASIX) 80 MG tablet Take 1 tablet (80 mg total) by mouth 2 (two) times daily. 11/14/19   Hilty, [name], MD  hydrALAZINE (APRESOLINE) 100 MG tablet Take 1 tablet by mouth twice daily 07/20/20   Hilty, [name], MD  metolazone (ZAROXOLYN) 5 MG tablet TAKE 1 TABLET BY MOUTH TWICE A WEEK ON  [facility]  AND  SATURDAYS 03/31/20   Hilty, [name], MD  metoprolol tartrate (LOPRESSOR) 25 MG tablet Take 1 tablet (25 mg total) by mouth 2 (two) times daily. 11/15/19   Kroeger, Lorelee Cover., PA-C  MITIGARE 0.6 MG CAPS  11/07/19   [provider]  potassium chloride SA (KLOR-CON) 20 MEQ tablet TAKE 1  BY MOUTH ONCE DAILY 11/14/19   Hilty, [name], MD  Turmeric (QC TUMERIC COMPLEX PO) Take 1 tablet by mouth daily.    [provider]  valsartan (DIOVAN) 320 MG tablet TAKE 1 TABLET BY MOUTH ONCE DAILY 10/29/19   [provider]  warfarin (COUMADIN) 4 MG tablet TAKE 1 to 2 TABLETS BY MOUTH AS DIRECTED BY COUMADIN  CLINIC 07/21/20   Hilty, [name], MD    Allergies    Patient has no known  allergies.  Review of Systems   Review of Systems  Constitutional: Negative for chills and fever.  HENT: Negative for congestion and facial swelling.   Eyes: Negative for discharge and visual disturbance.  Respiratory: Negative for shortness of breath.   Cardiovascular: Negative for chest pain and palpitations.  Gastrointestinal: Negative for abdominal pain, diarrhea and vomiting.  Musculoskeletal: Positive for back pain. Negative for arthralgias and myalgias.  Skin: Negative for color change and rash.  Neurological: Negative for tremors, syncope and headaches.  Psychiatric/Behavioral: Negative for confusion and dysphoric mood.    Physical Exam Updated Vital Signs BP 99/80 (BP Location: Left Arm)   Pulse 99   Temp 97.8 F (36.6 C) (Oral)   Resp (!) 22   SpO2 100%   Physical Exam Vitals and nursing note reviewed.  Constitutional:      Appearance: He is well-developed and well-nourished.  HENT:     Head: Normocephalic and atraumatic.  Eyes:     Extraocular Movements: EOM normal.     Pupils: Pupils are equal, round, and reactive to light.  Neck:     Vascular: No JVD.  Cardiovascular:     Rate and Rhythm: Normal rate and regular rhythm.     Heart sounds: No murmur heard. No friction rub. No gallop.   Pulmonary:     Effort: No respiratory distress.     Breath sounds: No wheezing.  Abdominal:     General: There is no distension.     Tenderness: There is no abdominal tenderness. There is no guarding or rebound.  Musculoskeletal:        General: Normal range of motion.     Cervical back: Normal range of motion and neck supple.     Comments: Right low back pain about the SI joint.  No midline tenderness.  Negative straight leg raise test.  Patient is able to  ambulate though with pain.  Reflexes are 2+ and equal.  Pulse motor and sensation intact.  No clonus.  Skin:    Coloration: Skin is not pale.     Findings: No rash.  Neurological:     Mental Status: He is alert and  oriented to person, place, and time.  Psychiatric:        Mood and Affect: Mood and affect normal.        Behavior: Behavior normal.     ED Results / Procedures / Treatments   Labs (all labs ordered are listed, but only abnormal results are displayed) Labs Reviewed  COMPREHENSIVE METABOLIC PANEL - Abnormal; Notable for the following components:      Result Value   Sodium 132 (*)    Chloride 90 (*)    BUN 65 (*)    Creatinine, Ser 2.41 (*)    Total Protein 8.3 (*)    Total Bilirubin 2.2 (*)    GFR, Estimated 29 (*)    Anion gap 17 (*)    All other components within normal limits  CBC - Abnormal; Notable for the following components:   WBC 12.2 (*)    RDW 15.7 (*)    All other components within normal limits  LIPASE, BLOOD    EKG None  Radiology CT L-SPINE NO CHARGE  Result Date: 08/15/2020 CLINICAL DATA:  70 year old male with flank pain, right back pain for 3 days. Dark urine. History of colon cancer. EXAM: CT LUMBAR SPINE WITHOUT CONTRAST TECHNIQUE: Technique: Multiplanar CT images of the lumbar spine were reconstructed from contemporary CT of the Abdomen and Pelvis. CONTRAST:  None COMPARISON:  CT Abdomen and Pelvis today are reported separately. CT Chest, Abdomen, and Pelvis 01/28/2019. FINDINGS: Segmentation: Normal. Alignment: Stable since 2020. Preserved lumbar lordosis. Subtle retrolisthesis of L5 on S1 is unchanged. Vertebrae: Visible bone mineralization stable and within normal limits. No acute osseous abnormality identified. Intact visible sacrum and SI joints. Paraspinal and other soft tissues: Abdominal and pelvic viscera today are reported separately. Lumbar paraspinal soft tissues are within normal limits. There is chronic dystrophic calcification along the right gluteal fascia, unchanged. Disc levels: Ankylosis of the visible lower thoracic spine related to flowing endplate osteophytes or syndesmophytes. That ankylosis continues to the L1-L2 level. L2-L3:  Superimposed bulky anterior endplate osteophytosis at L2-L3 and mild facet hypertrophy but no ankylosis at that level. No significant stenosis. L3-L4: Circumferential disc bulge and endplate spurring at R7-E0 with moderate facet hypertrophy. New oval space-occupying mass in the right lateral spinal canal at that level (series 1, image 82) when compared to 2020 measures about 13 mm length by 7 mm short axis and is most likely a degenerative synovial cyst. Severe right lateral recess stenosis. Up to moderate new spinal stenosis. Moderate L3 neural foraminal stenosis appears primarily pre-existing, stable. L4-L5: Comparatively mild disc bulging. Anterior endplate spurring. Mild to moderate facet hypertrophy. No spinal stenosis. Moderate L4 foraminal stenosis appears chronic and stable. L5-S1: Chronic disc space loss. Mostly far lateral disc osteophyte complex. Mild to moderate facet hypertrophy. No spinal stenosis. Mild to moderate L5 foraminal stenosis is greater on the right, chronic and stable. IMPRESSION: 1. 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 Severe right lateral recess stenosis and up to moderate spinal stenosis. Query right L4 radiculitis. MRI lumbar spine (without and with contrast preferred given history of colon cancer) would confirm. 2. Elsewhere lumbar spine degeneration is stable  since 2020, with chronic neural foraminal stenosis but no other significant spinal stenosis. 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 Electronically Signed   By: Genevie Ann M.D.   On: 08/15/2020 06:27   CT Renal Stone Study  Result Date: 08/15/2020 CLINICAL DATA:  70 year old male with flank pain, right back pain for 3 days. Dark urine. History of colon cancer. EXAM: CT ABDOMEN AND PELVIS WITHOUT CONTRAST TECHNIQUE: Multidetector CT imaging of the abdomen and pelvis was performed following the standard protocol without  IV contrast. COMPARISON:  CT lumbar spine today reported separately. CT Chest, Abdomen, and Pelvis 01/28/2019. 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 No acute finding at the lung bases. There is chronic traction on the distal thoracic esophagus. Cardiac size remains normal. No pericardial effusion. Hepatobiliary: Negative noncontrast liver and gallbladder. Pancreas: Stable, negative. Spleen: Negative. Adrenals/Urinary Tract: Normal adrenal glands. Kidneys appear stable since 2020, mild chronic perinephric stranding. No hydronephrosis. No nephrolithiasis. Both ureters are decompressed and normal. Diminutive and unremarkable urinary bladder. No perivesical stranding. Stomach/Bowel: Mild stool ball in the rectum, similar retained stool in redundant sigmoid colon. Low-density retained stool also intermittently in the descending and transverse colon. Small to large bowel anastomosis in the right upper abdomen appears stable with no adverse features. No large bowel wall thickening. No dilated small bowel. Decompressed stomach. No free air, free fluid, mesenteric stranding. Vascular/Lymphatic: Mild Aortoiliac calcified atherosclerosis. Normal caliber abdominal aorta. Vascular patency is not evaluated in the absence of IV contrast. No lymphadenopathy. Reproductive: Chronic small fat containing inguinal hernias. Otherwise negative. Other: No pelvic free fluid. Musculoskeletal: Lumbar spine detailed separately today. No acute or suspicious osseous lesion identified. IMPRESSION: 1. No acute or inflammatory process identified in the non-contrast abdomen or pelvis. No urinary calculus or acute uropathy. No metastatic disease is evident. 2. Lumbar spine CT detailed separately. 3. 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 4. Aortic Atherosclerosis (ICD10-I70.0). Electronically  Signed   By: Genevie Ann M.D.   On: 08/15/2020 06:20    Procedures Procedures   Medications Ordered in ED Medications  sodium chloride 0.9 % bolus 1,000 mL (has no administration in time range)  acetaminophen (TYLENOL) tablet 1,000 mg (has no administration in time range)  morphine 4 MG/ML injection 4 mg (4 mg Intravenous Given 08/15/20 0659)  ondansetron (ZOFRAN) injection 4 mg (4 mg Intravenous Given 08/15/20 9833)    ED Course  I have reviewed the triage vital signs and the nursing notes.  Pertinent labs & imaging results that were available during my care of the patient were reviewed by me and considered in my medical decision making (see chart for details).    MDM Rules/Calculators/A&P                          70 yo M with a cc of R sided low back pain.  Atraumatic.  Sounds musculoskeletal by hx.  CT, treat pain.  Reassess.   CT scan concerning for some new mass.  Recommending MRI.  I did discuss this with [place], PA-C on call for neurosurgery did recommending obtaining an MRI.  Patient care was signed out to Dr. Gilford Raid please see her note for further details care in the ED.  The patients results and plan were reviewed and discussed.   Any x-rays performed were independently reviewed by  myself.   Differential diagnosis were considered with the presenting HPI.  Medications  sodium chloride 0.9 % bolus 1,000 mL (has no administration in time range)  acetaminophen (TYLENOL) tablet 1,000 mg (has no administration in time range)  morphine 4 MG/ML injection 4 mg (4 mg Intravenous Given 08/15/20 0659)  ondansetron (ZOFRAN) injection 4 mg (4 mg Intravenous Given 08/15/20 0659)    Vitals:   08/14/20 2236 08/15/20 0038 08/15/20 0151 08/15/20 0459  BP: (!) 87/67 93/61 108/76 99/80  Pulse: (!) 57 68 88 99  Resp: 20 17 20  (!) 22  Temp: 98.6 F (37 C) 98 F (36.7 C) 97.8 F (36.6 C)   TempSrc: Oral Oral Oral   SpO2: 99% 98% 97% 100%    Final diagnoses:  Flank pain      Final Clinical Impression(s) / ED Diagnoses Final diagnoses:  Flank pain    Rx / DC Orders ED Discharge Orders    None       Deno Etienne, DO 08/15/20 1610

## 2020-08-15 NOTE — ED Notes (Signed)
Oxygen 2L [place] applied to pt. Gilford Raid, MD made aware. Will observe and reevaluate room air oxygen prior to discharge.

## 2020-08-15 NOTE — ED Notes (Signed)
MD spoke with patient and patient is discharged at this time; awaiting ride.

## 2020-08-15 NOTE — ED Notes (Signed)
Pt still in MRI 

## 2020-08-17 ENCOUNTER — Telehealth (HOSPITAL_COMMUNITY): Payer: Self-pay | Admitting: Emergency Medicine

## 2020-08-17 LAB — URINE CULTURE: Culture: 10000 — AB

## 2020-08-17 MED ORDER — CEPHALEXIN 500 MG PO CAPS: 500.0000 mg | ORAL_CAPSULE | Freq: Two times a day (BID) | ORAL | 0 refills | Status: AC

## 2020-08-27 ENCOUNTER — Other Ambulatory Visit: Payer: Self-pay

## 2020-08-27 ENCOUNTER — Encounter: Payer: Self-pay | Admitting: Internal Medicine

## 2020-08-27 ENCOUNTER — Ambulatory Visit (INDEPENDENT_AMBULATORY_CARE_PROVIDER_SITE_OTHER): Payer: Medicare HMO | Admitting: Internal Medicine

## 2020-08-27 ENCOUNTER — Ambulatory Visit (INDEPENDENT_AMBULATORY_CARE_PROVIDER_SITE_OTHER): Payer: Medicare HMO | Admitting: Pharmacist Clinician (PhC)/ Clinical Pharmacy Specialist

## 2020-08-27 VITALS — BP 98/70 | HR 85 | Ht 70.0 in | Wt 283.0 lb

## 2020-08-27 DIAGNOSIS — Z5181 Encounter for therapeutic drug level monitoring: Secondary | ICD-10-CM

## 2020-08-27 DIAGNOSIS — I4821 Permanent atrial fibrillation: Secondary | ICD-10-CM

## 2020-08-27 DIAGNOSIS — I5032 Chronic diastolic (congestive) heart failure: Secondary | ICD-10-CM | POA: Diagnosis not present

## 2020-08-27 DIAGNOSIS — G4733 Obstructive sleep apnea (adult) (pediatric): Secondary | ICD-10-CM | POA: Diagnosis not present

## 2020-08-27 DIAGNOSIS — I10 Essential (primary) hypertension: Secondary | ICD-10-CM | POA: Diagnosis not present

## 2020-08-27 LAB — POCT INR: INR: 4.8 — AB (ref 2.0–3.0)

## 2020-08-27 NOTE — Patient Instructions (Signed)

## 2020-08-27 NOTE — Progress Notes (Signed)
Marland Kitchen    OFFICE NOTE  Chief Complaint:  Follow-up  Primary Care Physician: [name], [place], [place]  HPI:  Steven Barnett is a 70 year old morbidly obese African American male with a history significant for hypertension, sleep disorder consistent with obstructive sleep apnea, recent diagnosis of diastolic heart failure with moderate LVH and recent diagnosis of postoperative atrial fibrillation (continues to be in atrial fibrillation) presenting with atypical chest pain. The patient was hospitalized in May 2010 and diagnosed with a right-sided empyema, for which he underwent a thoracotomy. He was found to be in atrial fibrillation postoperatively and has been in this rhythm ever since. At that time, he was also diagnosed with diastolic heart failure and found to have moderate left ventricular hypertrophy by echocardiogram.   Unfortunately, he was incarcerated for the past several years and has not been followed by cardiologist. His warfarin has been continued while in jail however was not adjusted regularly. He recently saw a new primary care provider with [facility] regional physicians and he is here to reestablish cardiac care and followup of his warfarin. He remains in atrial fibrillation today. He reports shortness of breath which is unchanged.  He denies any chest pain. He does report some leg swelling. He has problems with his knees from arthritis and pain. He also has not been able to lose weight. Finally, he has a history of sleep apnea but was never treated for this. He reports poor sleep at night and daytime fatigue and sometimes feels like he doesn't want to fall asleep because he feels like he is going to stop breathing. There is witnessed apnea.  I saw Steven Barnett in the office today. His main complaints are with erectile dysfunction. He is interested in Viagra. Denies any chest pain and reports good sleep. He is therapeutic on warfarin and is in permanent atrial fibrillation.  Steven Barnett returns  to the office today. He is inquiring about getting generic sildenafil as Viagra was not covered. He also wants to be referred for colonoscopy. I told him that would be up to his primary care provider but he said "I thought you were my primary care provider". I clarified the differences in of encouraged him to follow-up with his primary care provider with [facility] physicians. We will go ahead and refer him for screening colonoscopy. He was advised that he need to hold warfarin for least 5 days prior to colonoscopy.  I saw Steven Barnett back in the office today. He was seen this summer by Tarri Fuller, PA-C, for evaluation of cough and shortness of breath. A chest x-ray demonstrated possible pneumonia and was treated with doxycycline. He's not sure if this actually improved his symptoms or not. He recently has been absent for monthly warfarin checks. He says that he was not notified by the office however monthly appointments are scheduled and call also been made. He would like to arrange for a set day for him to come and get his warfarin checked.  Steven Barnett returns today for follow-up. He was recently hospitalized with acute on chronic diastolic heart failure. He had significant weight gain and edema with shortness of breath. He was diuresed and discharged in much better condition. He follows up today in the office. He says that he has not been entirely compliant with his medications, due to some confusion. His blood pressure today is 150/90 with a heart rate of 90. He says he's been taking 25 mg or 1 tablet of Lopressor twice daily and set of  2 tablets twice daily which was recommended. Weight on discharge was 264 and today is 273 although he measured to 69 at home. He denies any worsening swelling or shortness of breath.  Steven Barnett returns again today for hospital follow-up. He was last seen by myself on March 1 and that was for hospital follow-up. He is now seen again for another hospital follow-up. Unfortunately he  redeveloped a pneumonia and had acute on chronic congestive heart failure. He had significant diuresis and treated with antibiotics and steroids. He was discharged and reports his breathing has improved. He is now on 80 mg Lasix twice a day. He had confusing directions on prednisone. He has 60 mg tablets but his medication list indicated 10 mg tablets. He was advised to taper down, but he did not how to do that with the 60 mg tablets. Currently he is taking 20 mg 3 times a day. Weight is actually 2 pounds lower than discharge.  12/03/2015  Steven Barnett was seen back today in the office for follow-up. His heart rate was elevated today 124 and he reports he ran out of his metoprolol despite there being an active order in the pharmacy. We checked on that today. He apparently is taking diltiazem 90 mg daily. His INR was also recheck today and is subtherapeutic at 1.4. This is concerning for ongoing medication noncompliance. Spiked the increased heart rate he denies any worsening shortness of breath or chest pain.  04/06/2016  Steven Barnett was seen in the office today for follow-up. He is doing much better now that we've reinstituted his medicines. Blood pressure is well-controlled at 118/82 today. Heart rate is in well-controlled at 64 and he is in atrial fibrillation. Warfarin was assessed as well with an INR checked today. Unfortunately he recently was found to have a colorectal cancer by Dr. [name]. He is scheduled for a laparoscopic ileocolectomy by Dr. [name] on 04/12/2016.   04/24/2018  Steven Barnett returns today for follow-up.  Is been 2 years since I last saw him.  He is been followed in the Coumadin clinic and INR was therapeutic today.  He had undergone surgery for colon cancer in 2017 with adjuvant chemotherapy.  Follow-up afterwards has been reassuring with low CEAs and no evidence of recurrence.  He denies chest pain or worsening shortness of breath.  He has had no heart failure hospitalizations.  His main  complaint is daytime fatigue and sleepiness.  He says he has trouble falling asleep but generally uses Xanax that is not his prescription.  He does have a history of obstructive sleep apnea but was apparently intolerant of the mask.  The study was performed years ago and we discussed the possibility of repeating the study today based on the newer and more tolerable equipment available.  02/26/2020  Steven Barnett is seen today in follow-up.  He has been seen in the interim twice by Roby Lofts, PA-C for evaluation of shortness of breath.  He was also complaining of bendopnea/orthopnea -he has significant abdominal obesity.  His diuretics were increased due to some lower extremity swelling and is currently on a Lasix 80 mg twice daily with metolazone twice a week.  This seems to be controlling his edema and his weight is down a few pounds.  Blood pressure is also well controlled at 118/80 today with recent medication adjustments.  He remains in A. fib which is longstanding persistent and is anticoagulated on warfarin.  INR was elevated today 3.9 and will be adjusted by  our anticoagulation pharmacist.  08/27/2020  Steven Barnett returns today for follow-up.  He seems to be doing well.  He is lost about 10 pounds.  His INRs have been good.  Recently was seen in the emergency department with kidney pain.  He was found to have a cyst which is responded to pain medication and to steroids.  He denies any chest pain.    PMHx:  Past Medical History:  Diagnosis Date  . Anemia   . Arthralgia of both knees   . Arthritis    "right knee" (10/01/2015)  . Atrial fibrillation ([place])   . Cancer (HCC)    STAGE 1 COLON CANCER  . Chronic anticoagulation 2010   Coumadin  . Chronic diastolic CHF (congestive heart failure), NYHA class 2 ([place])   . Dyspnea   . Dysrhythmia   . Hypertension   . OSA (obstructive sleep apnea) 2016   "couldn't take the mask during the testing" (10/01/2015)  . Permanent atrial fibrillation ([place])  2010  . Pneumonia 3/29/2017and june 2017    Past Surgical History:  Procedure Laterality Date  . COLONOSCOPY WITH PROPOFOL N/A 03/04/2016   Procedure: COLONOSCOPY WITH PROPOFOL;  Surgeon: Carol Ada, MD;  Location: [facility];  Service: Endoscopy;  Laterality: N/A;  . COLONOSCOPY WITH PROPOFOL N/A 09/15/2017   Procedure: COLONOSCOPY WITH PROPOFOL;  Surgeon: Carol Ada, MD;  Location: [facility];  Service: Endoscopy;  Laterality: N/A;  . LAPAROSCOPIC PARTIAL COLECTOMY N/A 04/12/2016   Procedure: LAPAROSCOPIC ILEOCOLECTOMY;  Surgeon: [name], MD;  Location: [facility];  Service: General;  Laterality: N/A;  . PORT-A-CATH REMOVAL N/A 02/16/2017   Procedure: REMOVAL PORT-A-CATH;  Surgeon: [name], MD;  Location: [facility];  Service: General;  Laterality: N/A;  . PORTACATH PLACEMENT N/A 05/18/2016   Procedure: INSERTION PORT-A-CATH;  Surgeon: [name], MD;  Location: [facility];  Service: General;  Laterality: N/A;  . THORACOTOMY Right 2010    FAMHx:  Family History  Problem Relation Age of Onset  . Heart disease Father   . Heart failure Father   . Colon polyps Father        unspecified number - pt of intestine surgically resected  . Hypertension Mother   . Diabetes Mother   . Hyperlipidemia Mother   . Colon polyps Mother        unspecified number - pt of intestine surgically resected  . Dementia Mother        d. 43  . Stroke Maternal Grandmother   . Stroke Sister   . Colon cancer Sister 29       w/ "cancerous polyps" - unspecified number; underwent surgery and radiation  . Colon polyps Brother        unspecified number - polypectomies  . Colon cancer Sister        dx 36-60; s/p surgery and radiation  . Bone cancer Maternal Uncle        dx. 25s  . Lung cancer Paternal Grandfather        d. 80s-90s; lung cancer vs TB  . Colon cancer Sister        dx. 38-60; s/p surgery and radiation  . Cancer Maternal Uncle        mother had about 7-8 other siblings,  most passed at older ages, many of whom had some type of cancer  . Heart attack Neg Hx     SOCHx:   reports that he quit smoking about 45 years ago. His smoking use included  cigarettes. He has a 0.20 pack-year smoking history. He has never used smokeless tobacco. He reports that he does not drink alcohol and does not use drugs.  ALLERGIES:  No Known Allergies  ROS: Pertinent items noted in HPI and remainder of comprehensive ROS otherwise negative.  HOME MEDS: Current Outpatient Medications  Medication Sig Dispense Refill  . acetaminophen (TYLENOL) 500 MG tablet Take 1,000 mg by mouth 2 (two) times daily.    Marland Kitchen albuterol (PROVENTIL HFA;VENTOLIN HFA) 108 (90 Base) MCG/ACT inhaler Inhale 2 puffs into the lungs every 6 (six) hours as needed for wheezing or shortness of breath. 1 Inhaler 2  . aspirin EC 81 MG tablet Take 81 mg by mouth daily.    . colchicine 0.6 MG tablet Take 0.6 mg by mouth 2 (two) times daily.    Marland Kitchen diltiazem (CARDIZEM) 120 MG tablet TAKE 1 TABLET BY MOUTH ONCE DAILY 30 tablet 6  . furosemide (LASIX) 80 MG tablet Take 1 tablet (80 mg total) by mouth 2 (two) times daily. 180 tablet 3  . hydrALAZINE (APRESOLINE) 100 MG tablet Take 1 tablet by mouth twice daily 180 tablet 0  . HYDROcodone-acetaminophen (NORCO/VICODIN) 5-325 MG tablet Take 1 tablet by mouth every 4 (four) hours as needed. 10 tablet 0  . metolazone (ZAROXOLYN) 5 MG tablet TAKE 1 TABLET BY MOUTH TWICE A WEEK ON  WEDNESDAYS  AND  SATURDAYS 15 tablet 6  . metoprolol tartrate (LOPRESSOR) 25 MG tablet Take 1 tablet (25 mg total) by mouth 2 (two) times daily. 180 tablet 3  . MITIGARE 0.6 MG CAPS     . potassium chloride SA (KLOR-CON) 20 MEQ tablet TAKE 1  BY MOUTH ONCE DAILY 90 tablet 3  . predniSONE (STERAPRED UNI-PAK 21 TAB) 10 MG (21) TBPK tablet Take 6 tabs for 2 days, then 5 for 2 days, then 4 for 2 days, then 3 for 2 days, 2 for 2 days, then 1 for 2 days 42 tablet 0  . Turmeric (QC TUMERIC COMPLEX PO) Take 1  tablet by mouth daily.    . valsartan (DIOVAN) 320 MG tablet TAKE 1 TABLET BY MOUTH ONCE DAILY    . warfarin (COUMADIN) 4 MG tablet TAKE 1 to 2 TABLETS BY MOUTH AS DIRECTED BY COUMADIN  CLINIC 60 tablet 0   No current facility-administered medications for this visit.   Facility-Administered Medications Ordered in Other Visits  Medication Dose Route Frequency Provider Last Rate Last Admin  . heparin lock flush 100 unit/mL  500 Units Intravenous Once PRN [name], MD      . sodium chloride flush (NS) 0.9 % injection 10 mL  10 mL Intravenous PRN [name], MD        LABS/IMAGING: Results for orders placed or performed in visit on 08/27/20 (from the past 48 hour(s))  POCT INR     Status: Abnormal   Collection Time: 08/27/20 10:27 AM  Result Value Ref Range   INR 4.8 (A) 2.0 - 3.0   No results found.  VITALS: BP 98/70   Pulse 85   Ht 5\' 10"  (1.778 m)   Wt 283 lb (128.4 kg)   SpO2 98%   BMI 40.61 kg/m   EXAM: General appearance: alert, no distress and morbidly obese Neck: no carotid bruit and no JVD Lungs: clear to auscultation bilaterally Heart: irregularly irregular rhythm Abdomen: soft, non-tender; bowel sounds normal; no masses,  no organomegaly Extremities: edema trace pitting edema Pulses: 2+ and symmetric Skin: Skin color, texture, turgor normal.  No rashes or lesions Neurologic: Grossly normal Psych: Pleasant  EKG: A. fib at 85-personally reviewed  ASSESSMENT: 1. Chronic diastolic heart failure 2. Permanent atrial fibrillation - anticoagulated on warfarin, CHADSVASC score of 3 3. Morbid obesity 4. Obstructive sleep apnea-not on CPAP 5. Hypertension controlled 6. Leg edema 7. Bilateral knee osteoarthritis 8. Erectile dysfunction 9. Recent pneumonia 10. H/o medication noncompliance  PLAN: 1.   Steven Barnett has recently lost about 10 pounds.  He says he feels well.  He is made some dietary changes including cutting out shrimp and sodas mostly because of the fact  that they worsen his gout.  He had a recent kidney cyst but is improved on steroids.  His A. fib is rate controlled.  He is anticoagulated on warfarin with a therapeutic INR.  No new complaints today.  Follow-up with me annually or sooner as necessary.  Pixie Casino, MD, [facility], [place] Director of the [facility] Lipid Disorders &  Cardiovascular Risk Reduction Clinic Diplomate of the American Board of Clinical Lipidology Attending Cardiologist  Direct Dial: [phone]  Fax: [phone]  Website:  [email or web address] 08/27/2020, 11:07 AM

## 2020-09-09 ENCOUNTER — Other Ambulatory Visit: Payer: Self-pay | Admitting: Internal Medicine

## 2020-09-09 MED ORDER — FUROSEMIDE 80 MG PO TABS: 80.0000 mg | ORAL_TABLET | Freq: Two times a day (BID) | ORAL | 3 refills | Status: DC

## 2020-09-09 MED ORDER — WARFARIN SODIUM 4 MG PO TABS: ORAL_TABLET | ORAL | 0 refills | Status: DC

## 2020-09-09 NOTE — Telephone Encounter (Signed)
Rx(s) sent to pharmacy electronically.  

## 2020-09-11 ENCOUNTER — Other Ambulatory Visit: Payer: Self-pay

## 2020-09-11 MED ORDER — HYDRALAZINE HCL 100 MG PO TABS: 100.0000 mg | ORAL_TABLET | Freq: Two times a day (BID) | ORAL | 3 refills | Status: DC

## 2020-09-14 ENCOUNTER — Other Ambulatory Visit: Payer: Self-pay

## 2020-09-14 MED ORDER — METOLAZONE 5 MG PO TABS: ORAL_TABLET | ORAL | 6 refills | Status: DC

## 2020-09-14 MED ORDER — VALSARTAN 320 MG PO TABS: 320.0000 mg | ORAL_TABLET | Freq: Every day | ORAL | 3 refills | Status: DC

## 2020-09-15 ENCOUNTER — Other Ambulatory Visit: Payer: Self-pay

## 2020-09-15 MED ORDER — POTASSIUM CHLORIDE CRYS ER 20 MEQ PO TBCR: EXTENDED_RELEASE_TABLET | ORAL | 3 refills | Status: DC

## 2020-09-15 MED ORDER — METOPROLOL TARTRATE 25 MG PO TABS: 25.0000 mg | ORAL_TABLET | Freq: Two times a day (BID) | ORAL | 3 refills | Status: DC

## 2020-09-30 ENCOUNTER — Other Ambulatory Visit: Payer: Self-pay

## 2020-09-30 ENCOUNTER — Ambulatory Visit (INDEPENDENT_AMBULATORY_CARE_PROVIDER_SITE_OTHER): Payer: Medicare HMO

## 2020-09-30 DIAGNOSIS — I4821 Permanent atrial fibrillation: Secondary | ICD-10-CM | POA: Diagnosis not present

## 2020-09-30 DIAGNOSIS — Z5181 Encounter for therapeutic drug level monitoring: Secondary | ICD-10-CM | POA: Diagnosis not present

## 2020-09-30 LAB — POCT INR: INR: 4.4 — AB (ref 2.0–3.0)

## 2020-09-30 NOTE — Patient Instructions (Signed)
Hold today only and then decrease to 2 tables daily except 1 tablet on Monday, Wednesday and Friday. INR in 4 weeks. Call us with medication changes or concerns [phone].

## 2020-10-01 ENCOUNTER — Other Ambulatory Visit: Payer: Self-pay | Admitting: Internal Medicine

## 2020-10-24 ENCOUNTER — Other Ambulatory Visit: Payer: Self-pay | Admitting: Internal Medicine

## 2020-10-28 ENCOUNTER — Other Ambulatory Visit: Payer: Self-pay

## 2020-10-28 ENCOUNTER — Ambulatory Visit (INDEPENDENT_AMBULATORY_CARE_PROVIDER_SITE_OTHER): Payer: Medicare HMO

## 2020-10-28 DIAGNOSIS — I4821 Permanent atrial fibrillation: Secondary | ICD-10-CM

## 2020-10-28 DIAGNOSIS — Z5181 Encounter for therapeutic drug level monitoring: Secondary | ICD-10-CM

## 2020-10-28 LAB — POCT INR: INR: 2.5 (ref 2.0–3.0)

## 2020-10-28 NOTE — Patient Instructions (Signed)
2 tables daily except 1 tablet on Monday, Wednesday and Friday. INR in 6 weeks. Call us with medication changes or concerns [phone].

## 2020-12-09 ENCOUNTER — Other Ambulatory Visit: Payer: Self-pay | Admitting: Internal Medicine

## 2020-12-11 ENCOUNTER — Ambulatory Visit (INDEPENDENT_AMBULATORY_CARE_PROVIDER_SITE_OTHER): Payer: Medicare HMO

## 2020-12-11 ENCOUNTER — Other Ambulatory Visit: Payer: Self-pay

## 2020-12-11 DIAGNOSIS — Z5181 Encounter for therapeutic drug level monitoring: Secondary | ICD-10-CM | POA: Diagnosis not present

## 2020-12-11 DIAGNOSIS — I4821 Permanent atrial fibrillation: Secondary | ICD-10-CM | POA: Diagnosis not present

## 2020-12-11 LAB — POCT INR: INR: 2.5 (ref 2.0–3.0)

## 2020-12-11 NOTE — Patient Instructions (Signed)
2 tables daily except 1 tablet on Monday, Wednesday and Friday. INR in 6 weeks. Call us with medication changes or concerns [phone].

## 2021-01-13 ENCOUNTER — Telehealth: Payer: Self-pay

## 2021-01-13 NOTE — Telephone Encounter (Signed)
   [facility] Pre-operative Risk Assessment    Patient Name: Steven Barnett  DOB: 13-Dec-1950 MRN: [id number]   Request for surgical clearance:  What type of surgery is being performed? COLONOSCOPY   When is this surgery scheduled? 02-12-2021  What type of clearance is required (medical clearance vs. Pharmacy clearance to hold med vs. Both)? BOTH  Are there any medications that need to be held prior to surgery and how long? WARFARIN  Practice name and name of physician performing surgery? [facility]  What is the office phone number? [phone]   7.   What is the office fax number? [phone]  8.   Anesthesia type (None, local, MAC, general) ? PROPOFOL   Waylan Rocher 01/13/2021, 3:50 PM  _________________________________________________________________   (provider comments below)

## 2021-01-14 ENCOUNTER — Emergency Department (HOSPITAL_BASED_OUTPATIENT_CLINIC_OR_DEPARTMENT_OTHER): Payer: Medicare HMO

## 2021-01-14 ENCOUNTER — Other Ambulatory Visit: Payer: Self-pay

## 2021-01-14 ENCOUNTER — Encounter (HOSPITAL_BASED_OUTPATIENT_CLINIC_OR_DEPARTMENT_OTHER): Payer: Self-pay

## 2021-01-14 ENCOUNTER — Emergency Department (HOSPITAL_COMMUNITY): Payer: Medicare HMO

## 2021-01-14 ENCOUNTER — Inpatient Hospital Stay (HOSPITAL_BASED_OUTPATIENT_CLINIC_OR_DEPARTMENT_OTHER)
Admission: EM | Admit: 2021-01-14 | Discharge: 2021-01-17 | DRG: 640 | Disposition: A | Discharge status: Home Health | Payer: Medicare HMO | Attending: Internal Medicine | Admitting: Internal Medicine

## 2021-01-14 ENCOUNTER — Inpatient Hospital Stay (HOSPITAL_COMMUNITY): Payer: Medicare HMO

## 2021-01-14 DIAGNOSIS — M1990 Unspecified osteoarthritis, unspecified site: Secondary | ICD-10-CM | POA: Diagnosis present

## 2021-01-14 DIAGNOSIS — Z833 Family history of diabetes mellitus: Secondary | ICD-10-CM

## 2021-01-14 DIAGNOSIS — I951 Orthostatic hypotension: Secondary | ICD-10-CM | POA: Diagnosis not present

## 2021-01-14 DIAGNOSIS — Z7901 Long term (current) use of anticoagulants: Secondary | ICD-10-CM

## 2021-01-14 DIAGNOSIS — R571 Hypovolemic shock: Secondary | ICD-10-CM | POA: Diagnosis present

## 2021-01-14 DIAGNOSIS — Z6837 Body mass index (BMI) 37.0-37.9, adult: Secondary | ICD-10-CM | POA: Diagnosis not present

## 2021-01-14 DIAGNOSIS — D649 Anemia, unspecified: Secondary | ICD-10-CM | POA: Diagnosis present

## 2021-01-14 DIAGNOSIS — I4891 Unspecified atrial fibrillation: Secondary | ICD-10-CM | POA: Diagnosis present

## 2021-01-14 DIAGNOSIS — I130 Hypertensive heart and chronic kidney disease with heart failure and stage 1 through stage 4 chronic kidney disease, or unspecified chronic kidney disease: Secondary | ICD-10-CM | POA: Diagnosis present

## 2021-01-14 DIAGNOSIS — Z87891 Personal history of nicotine dependence: Secondary | ICD-10-CM

## 2021-01-14 DIAGNOSIS — Z800 Family history of malignant neoplasm of digestive organs: Secondary | ICD-10-CM

## 2021-01-14 DIAGNOSIS — R55 Syncope and collapse: Secondary | ICD-10-CM

## 2021-01-14 DIAGNOSIS — N179 Acute kidney failure, unspecified: Secondary | ICD-10-CM | POA: Diagnosis present

## 2021-01-14 DIAGNOSIS — Z20822 Contact with and (suspected) exposure to covid-19: Secondary | ICD-10-CM | POA: Diagnosis present

## 2021-01-14 DIAGNOSIS — G4733 Obstructive sleep apnea (adult) (pediatric): Secondary | ICD-10-CM | POA: Diagnosis present

## 2021-01-14 DIAGNOSIS — Z801 Family history of malignant neoplasm of trachea, bronchus and lung: Secondary | ICD-10-CM

## 2021-01-14 DIAGNOSIS — I4821 Permanent atrial fibrillation: Secondary | ICD-10-CM | POA: Diagnosis present

## 2021-01-14 DIAGNOSIS — Z8249 Family history of ischemic heart disease and other diseases of the circulatory system: Secondary | ICD-10-CM | POA: Diagnosis not present

## 2021-01-14 DIAGNOSIS — I959 Hypotension, unspecified: Principal | ICD-10-CM

## 2021-01-14 DIAGNOSIS — Z8371 Family history of colonic polyps: Secondary | ICD-10-CM

## 2021-01-14 DIAGNOSIS — I5032 Chronic diastolic (congestive) heart failure: Secondary | ICD-10-CM | POA: Diagnosis present

## 2021-01-14 DIAGNOSIS — E876 Hypokalemia: Secondary | ICD-10-CM | POA: Diagnosis present

## 2021-01-14 DIAGNOSIS — E860 Dehydration: Secondary | ICD-10-CM | POA: Diagnosis present

## 2021-01-14 DIAGNOSIS — E861 Hypovolemia: Secondary | ICD-10-CM | POA: Diagnosis present

## 2021-01-14 DIAGNOSIS — R2681 Unsteadiness on feet: Secondary | ICD-10-CM | POA: Diagnosis present

## 2021-01-14 DIAGNOSIS — Z823 Family history of stroke: Secondary | ICD-10-CM

## 2021-01-14 DIAGNOSIS — N184 Chronic kidney disease, stage 4 (severe): Secondary | ICD-10-CM | POA: Diagnosis present

## 2021-01-14 DIAGNOSIS — T502X5A Adverse effect of carbonic-anhydrase inhibitors, benzothiadiazides and other diuretics, initial encounter: Secondary | ICD-10-CM | POA: Diagnosis present

## 2021-01-14 DIAGNOSIS — E872 Acidosis: Secondary | ICD-10-CM | POA: Diagnosis present

## 2021-01-14 DIAGNOSIS — R2689 Other abnormalities of gait and mobility: Secondary | ICD-10-CM | POA: Diagnosis present

## 2021-01-14 DIAGNOSIS — I4811 Longstanding persistent atrial fibrillation: Secondary | ICD-10-CM

## 2021-01-14 DIAGNOSIS — Z79899 Other long term (current) drug therapy: Secondary | ICD-10-CM

## 2021-01-14 DIAGNOSIS — Z83438 Family history of other disorder of lipoprotein metabolism and other lipidemia: Secondary | ICD-10-CM | POA: Diagnosis not present

## 2021-01-14 DIAGNOSIS — Z7982 Long term (current) use of aspirin: Secondary | ICD-10-CM

## 2021-01-14 LAB — I-STAT VENOUS BLOOD GAS, ED
Acid-Base Excess: 2 mmol/L (ref 0.0–2.0)
Bicarbonate: 27.4 mmol/L (ref 20.0–28.0)
Calcium, Ion: 1.05 mmol/L — ABNORMAL LOW (ref 1.15–1.40)
HCT: 34 % — ABNORMAL LOW (ref 39.0–52.0)
Hemoglobin: 11.6 g/dL — ABNORMAL LOW (ref 13.0–17.0)
O2 Saturation: 87 %
Patient temperature: 98.2
Potassium: 2.5 mmol/L — CL (ref 3.5–5.1)
Sodium: 140 mmol/L (ref 135–145)
TCO2: 29 mmol/L (ref 22–32)
pCO2, Ven: 44.1 mmHg (ref 44.0–60.0)
pH, Ven: 7.4 (ref 7.250–7.430)
pO2, Ven: 53 mmHg — ABNORMAL HIGH (ref 32.0–45.0)

## 2021-01-14 LAB — TSH: TSH: 0.822 u[IU]/mL (ref 0.350–4.500)

## 2021-01-14 LAB — BASIC METABOLIC PANEL
Anion gap: 11 (ref 5–15)
Anion gap: 10 (ref 5–15)
BUN: 74 mg/dL — ABNORMAL HIGH (ref 8–23)
BUN: 64 mg/dL — ABNORMAL HIGH (ref 8–23)
CO2: 31 mmol/L (ref 22–32)
CO2: 29 mmol/L (ref 22–32)
Calcium: 8.9 mg/dL (ref 8.9–10.3)
Calcium: 8.7 mg/dL — ABNORMAL LOW (ref 8.9–10.3)
Chloride: 93 mmol/L — ABNORMAL LOW (ref 98–111)
Chloride: 94 mmol/L — ABNORMAL LOW (ref 98–111)
Creatinine, Ser: 4.13 mg/dL — ABNORMAL HIGH (ref 0.61–1.24)
Creatinine, Ser: 3.06 mg/dL — ABNORMAL HIGH (ref 0.61–1.24)
GFR, Estimated: 15 mL/min — ABNORMAL LOW (ref >60)
GFR, Estimated: 21 mL/min — ABNORMAL LOW (ref >60)
Glucose, Bld: 136 mg/dL — ABNORMAL HIGH (ref 70–99)
Glucose, Bld: 93 mg/dL (ref 70–99)
Potassium: 3.1 mmol/L — ABNORMAL LOW (ref 3.5–5.1)
Potassium: 3.4 mmol/L — ABNORMAL LOW (ref 3.5–5.1)
Sodium: 135 mmol/L (ref 135–145)
Sodium: 133 mmol/L — ABNORMAL LOW (ref 135–145)

## 2021-01-14 LAB — MAGNESIUM
Magnesium: 1.6 mg/dL — ABNORMAL LOW (ref 1.7–2.4)
Magnesium: 2 mg/dL (ref 1.7–2.4)

## 2021-01-14 LAB — CBC WITH DIFFERENTIAL/PLATELET
Abs Immature Granulocytes: 0.03 10*3/uL (ref 0.00–0.07)
Basophils Absolute: 0.1 10*3/uL (ref 0.0–0.1)
Basophils Relative: 1 %
Eosinophils Absolute: 0.1 10*3/uL (ref 0.0–0.5)
Eosinophils Relative: 2 %
HCT: 40.1 % (ref 39.0–52.0)
Hemoglobin: 13.1 g/dL (ref 13.0–17.0)
Immature Granulocytes: 1 %
Lymphocytes Relative: 21 %
Lymphs Abs: 1.1 10*3/uL (ref 0.7–4.0)
MCH: 27.6 pg (ref 26.0–34.0)
MCHC: 32.7 g/dL (ref 30.0–36.0)
MCV: 84.6 fL (ref 80.0–100.0)
Monocytes Absolute: 0.6 10*3/uL (ref 0.1–1.0)
Monocytes Relative: 11 %
Neutro Abs: 3.5 10*3/uL (ref 1.7–7.7)
Neutrophils Relative %: 64 %
Platelets: 141 10*3/uL — ABNORMAL LOW (ref 150–400)
RBC: 4.74 MIL/uL (ref 4.22–5.81)
RDW: 15.9 % — ABNORMAL HIGH (ref 11.5–15.5)
WBC: 5.4 10*3/uL (ref 4.0–10.5)
nRBC: 0 % (ref 0.0–0.2)

## 2021-01-14 LAB — AMMONIA: Ammonia: 35 umol/L (ref 9–35)

## 2021-01-14 LAB — URINALYSIS, ROUTINE W REFLEX MICROSCOPIC
Bilirubin Urine: NEGATIVE
Glucose, UA: NEGATIVE mg/dL
Hgb urine dipstick: NEGATIVE
Ketones, ur: NEGATIVE mg/dL
Leukocytes,Ua: NEGATIVE
Nitrite: NEGATIVE
Protein, ur: NEGATIVE mg/dL
Specific Gravity, Urine: 1.01 (ref 1.005–1.030)
pH: 5.5 (ref 5.0–8.0)

## 2021-01-14 LAB — BRAIN NATRIURETIC PEPTIDE: B Natriuretic Peptide: 109.4 pg/mL — ABNORMAL HIGH (ref 0.0–100.0)

## 2021-01-14 LAB — RESP PANEL BY RT-PCR (FLU A&B, COVID) ARPGX2
Influenza A by PCR: NEGATIVE
Influenza B by PCR: NEGATIVE
SARS Coronavirus 2 by RT PCR: NEGATIVE

## 2021-01-14 LAB — HEPATIC FUNCTION PANEL
ALT: 15 U/L (ref 0–44)
AST: 27 U/L (ref 15–41)
Albumin: 4.1 g/dL (ref 3.5–5.0)
Alkaline Phosphatase: 101 U/L (ref 38–126)
Bilirubin, Direct: 0.1 mg/dL (ref 0.0–0.2)
Indirect Bilirubin: 0.5 mg/dL (ref 0.3–0.9)
Total Bilirubin: 0.6 mg/dL (ref 0.3–1.2)
Total Protein: 7.2 g/dL (ref 6.5–8.1)

## 2021-01-14 LAB — CBG MONITORING, ED
Glucose-Capillary: 132 mg/dL — ABNORMAL HIGH (ref 70–99)
Glucose-Capillary: 135 mg/dL — ABNORMAL HIGH (ref 70–99)

## 2021-01-14 LAB — TROPONIN I (HIGH SENSITIVITY)
Troponin I (High Sensitivity): 13 ng/L (ref <18)
Troponin I (High Sensitivity): 9 ng/L (ref <18)

## 2021-01-14 LAB — PROTIME-INR
INR: 2.5 — ABNORMAL HIGH (ref 0.8–1.2)
Prothrombin Time: 26.6 seconds — ABNORMAL HIGH (ref 11.4–15.2)

## 2021-01-14 LAB — D-DIMER, QUANTITATIVE: D-Dimer, Quant: <0.27 ug/mL-FEU (ref 0.00–0.50)

## 2021-01-14 LAB — OCCULT BLOOD X 1 CARD TO LAB, STOOL: Fecal Occult Bld: NEGATIVE

## 2021-01-14 LAB — LACTIC ACID, PLASMA: Lactic Acid, Venous: 2 mmol/L (ref 0.5–1.9)

## 2021-01-14 IMAGING — DX DG CHEST 1V PORT
1 series · 1 of 1 positions shown · non-contrast
Comparison: CT chest dated [DATE]. Chest x-ray dated
[DATE].

CLINICAL DATA: Lightheadedness hypotension.

EXAM:
PORTABLE CHEST 1 VIEW

[chest ap]
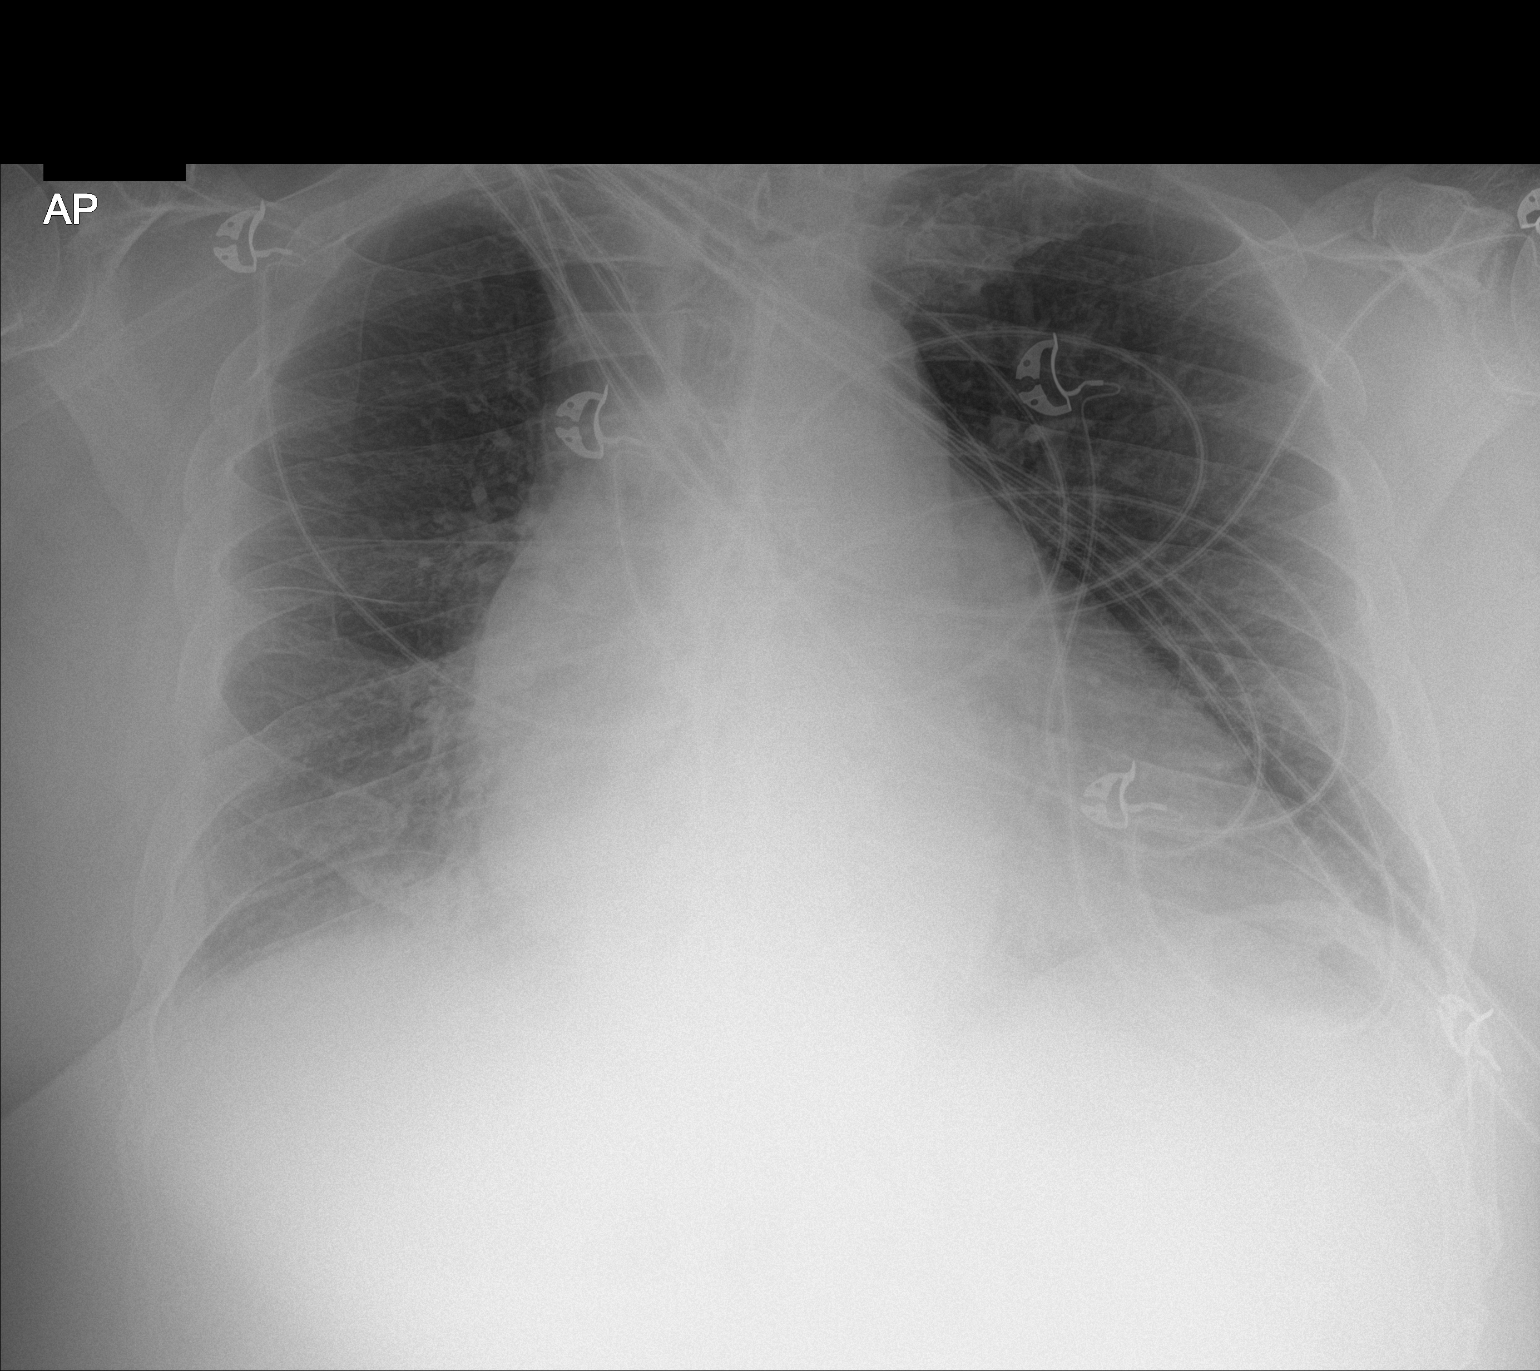

[1 of 1 positions shown; findings below may reference images not displayed]

FINDINGS: Stable mild cardiomegaly. Normal pulmonary vascularity. Unchanged
chronic scarring at the right lung base and costophrenic angle. No
focal consolidation, pleural effusion, or pneumothorax. No acute
osseous abnormality.
IMPRESSION: No active disease.

## 2021-01-14 IMAGING — US US RENAL
1 series · 14 of 25 positions shown · non-contrast
Comparison: None.

CLINICAL DATA: Acute renal failure

EXAM:
RENAL / URINARY TRACT ULTRASOUND COMPLETE

[Series 1: us renal · 14 of 29 slices shown]
[im 1/29]
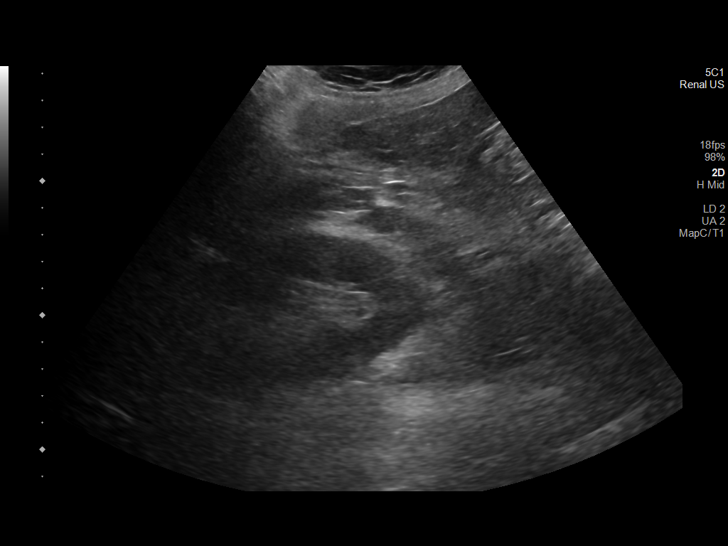
[im 3/29]
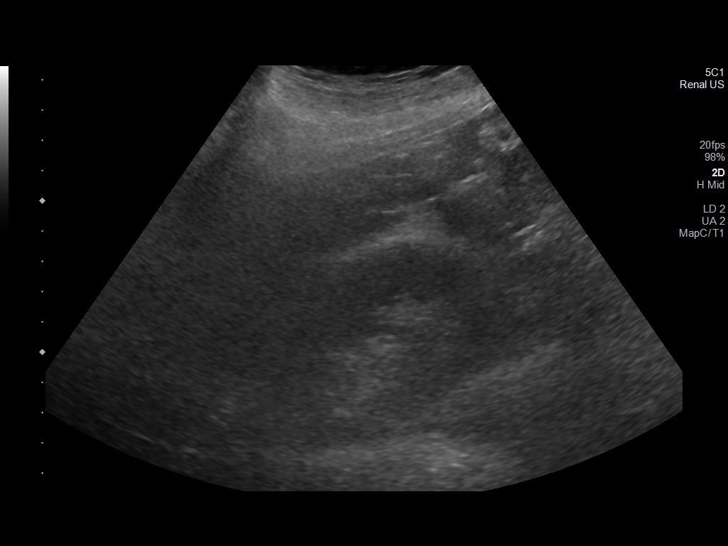
[im 5/29]
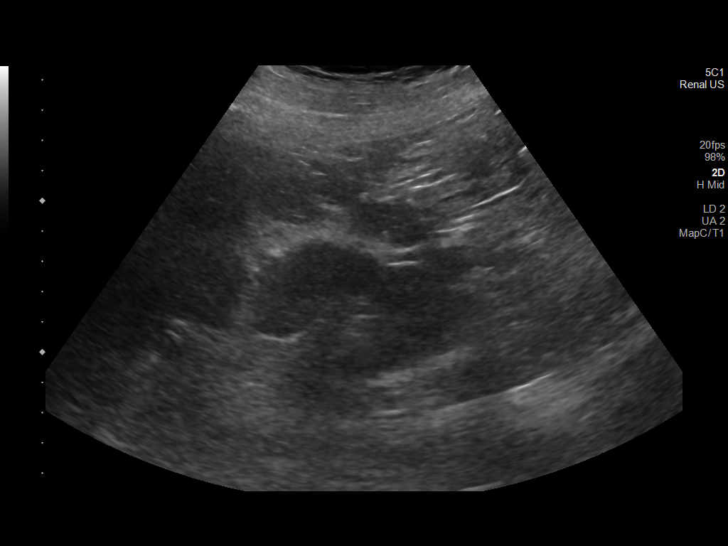
[im 8/29]
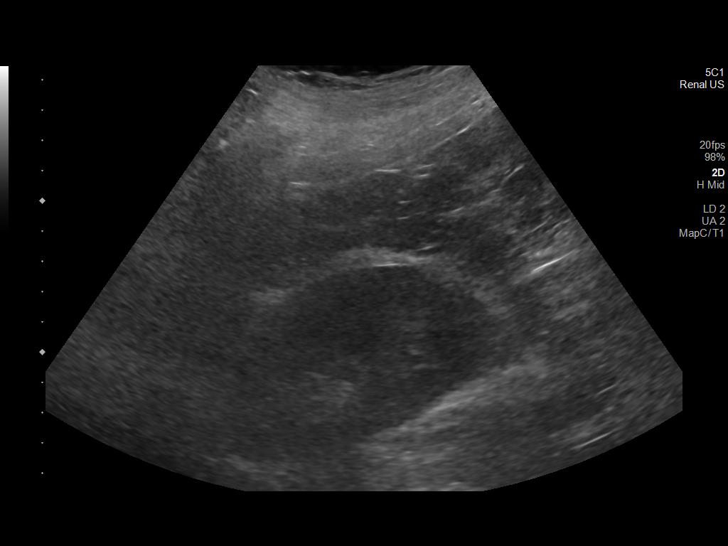
[im 10/29]
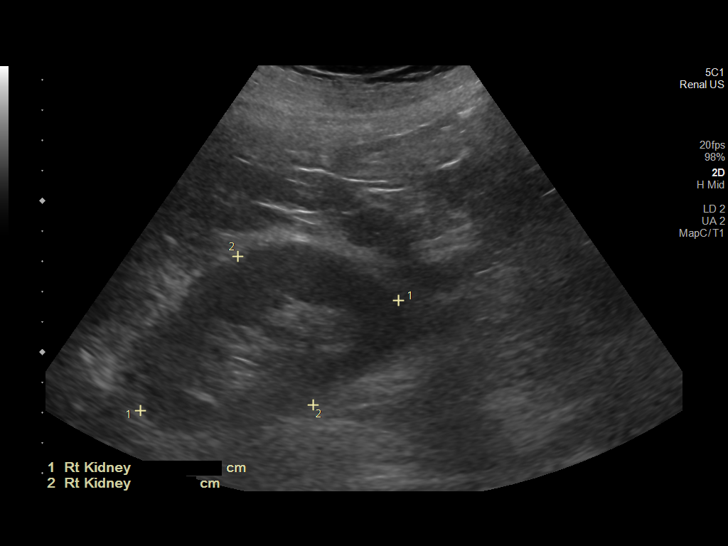
[im 11/29]
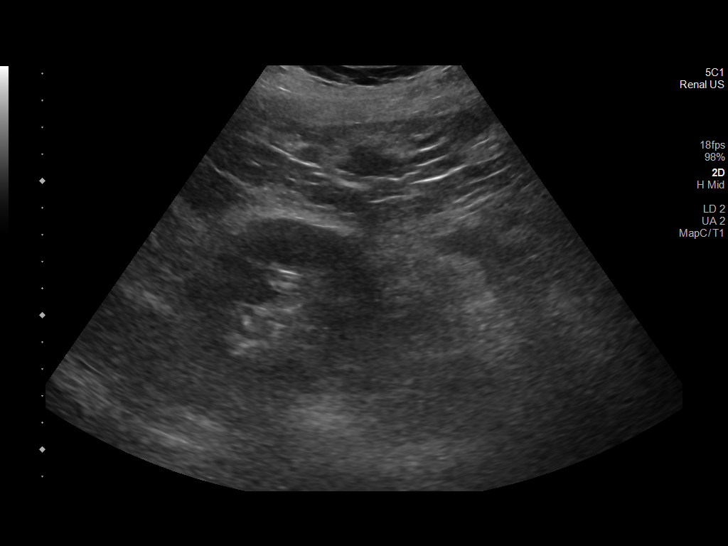
[im 13/29]
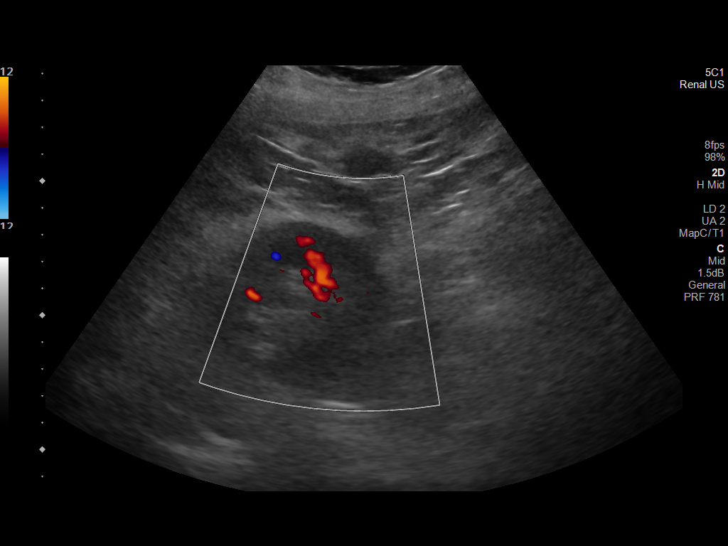
[im 16/29]
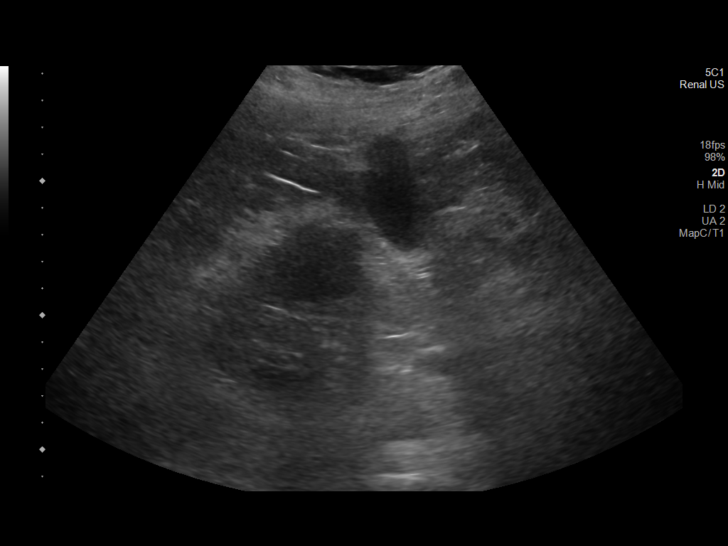
[im 18/29]
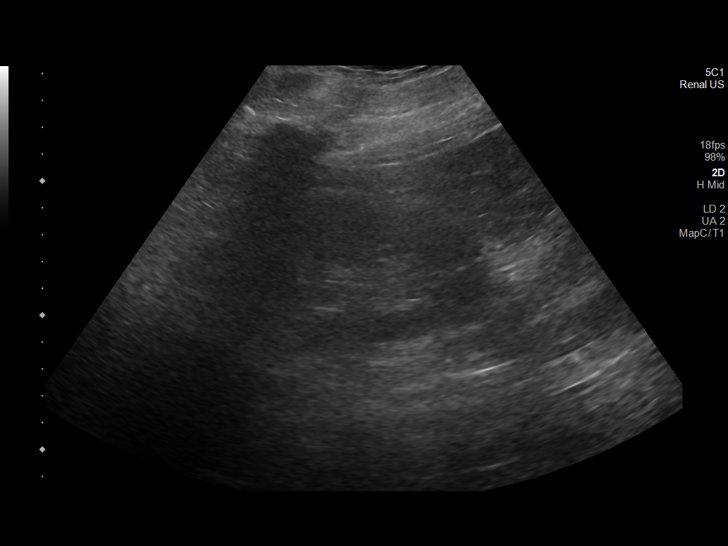
[im 19/29]
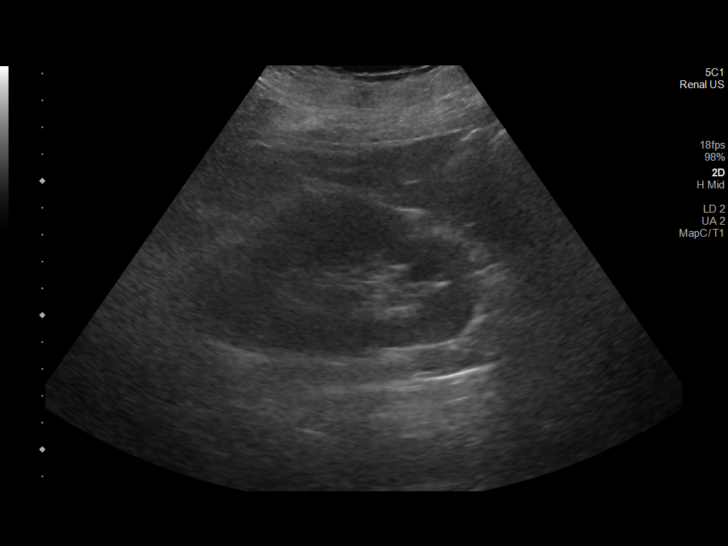
[im 22/29]
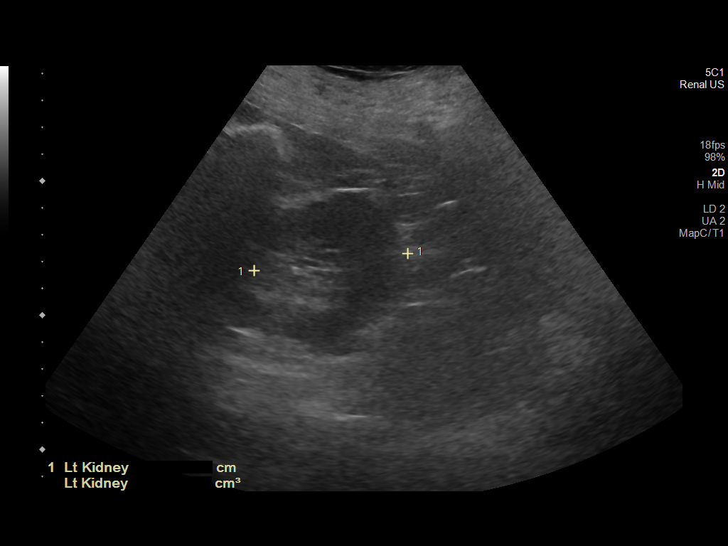
[im 24/29]
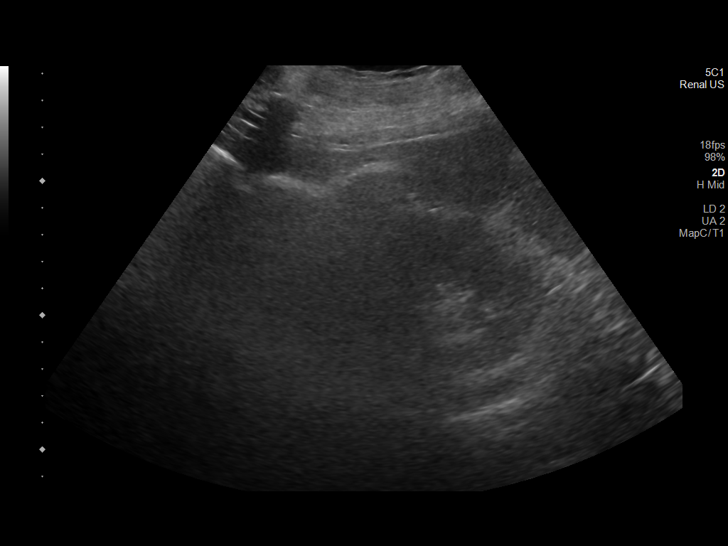
[im 26/29]
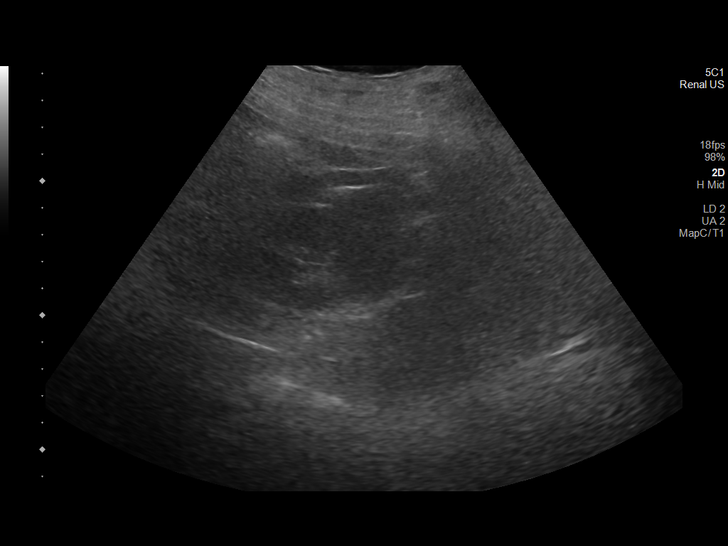
[im 29/29]
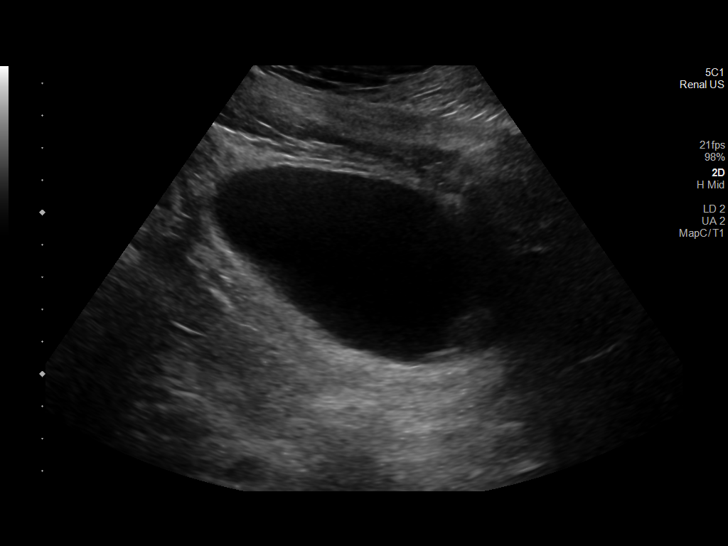

[14 of 25 positions shown; findings below may reference images not displayed]

FINDINGS: Right Kidney:

Renal measurements: 9.3 x 5.5 x 5.0 cm. = volume: 132 mL.
Echogenicity within normal limits. No mass or hydronephrosis
visualized.

Left Kidney:

Renal measurements: 10.8 x 6.0 x 5.8 cm. = volume: 195 mL.
Echogenicity within normal limits. No mass or hydronephrosis
visualized.

Bladder:

Appears normal for degree of bladder distention.

Other:

None.
IMPRESSION: No acute abnormality noted.

## 2021-01-14 IMAGING — CT CT HEAD W/O CM
3 series · 16 of 47 positions shown, 19 images · non-contrast
Comparison: None.

CLINICAL DATA: Dizziness

EXAM:
CT HEAD WITHOUT CONTRAST
TECHNIQUE: Contiguous axial images were obtained from the base of the skull
through the vertex without intravenous contrast.

[Series 2: head 5.0 h30s · axial · 0.52mm/px · z∈[-222,-72]mm · 10 of 36 slices shown, 13 images]
[im 3/36  brain]
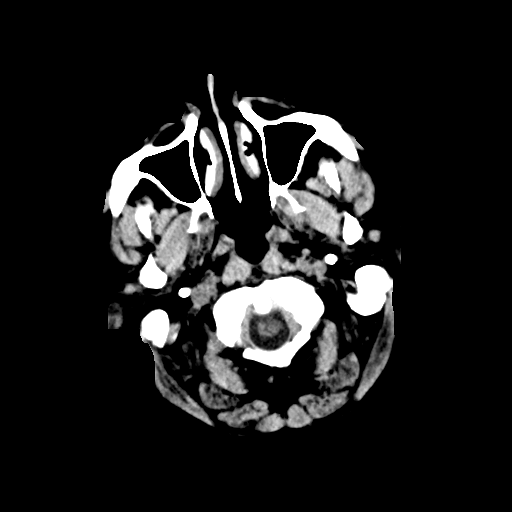
[im 3/36  bone]
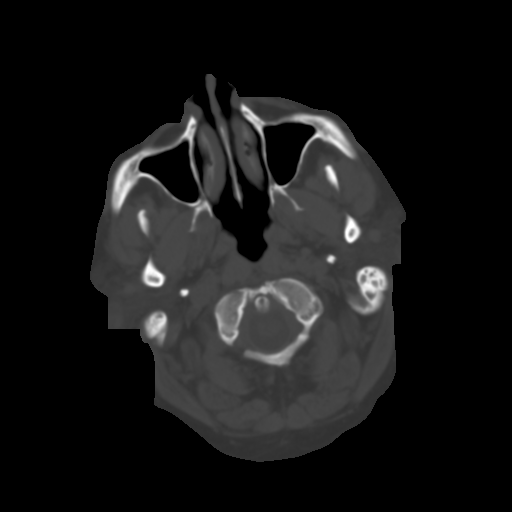
[im 7/36  brain]
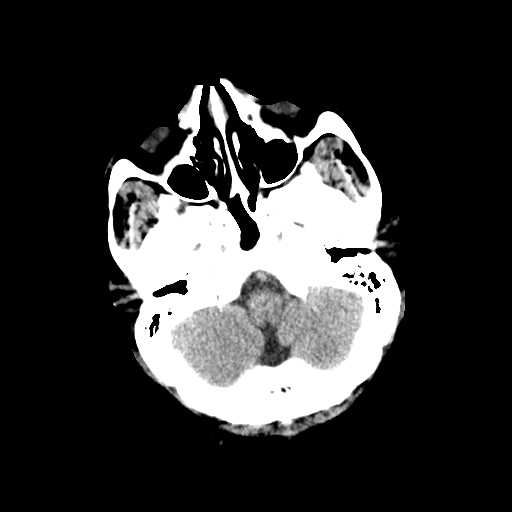
[im 10/36  brain]
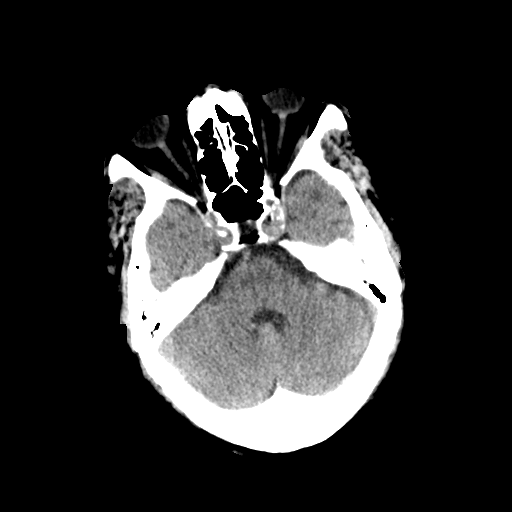
[im 13/36  brain]
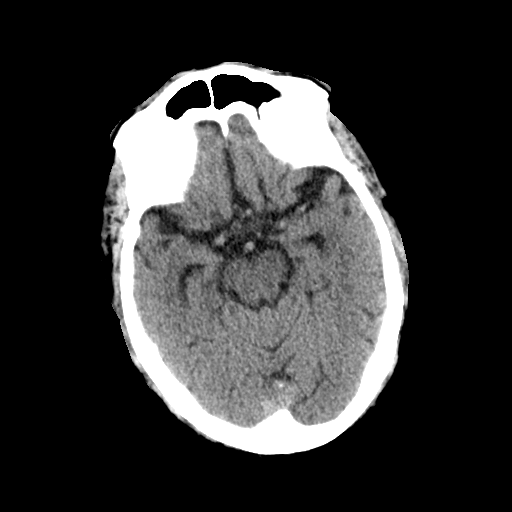
[im 16/36  brain]
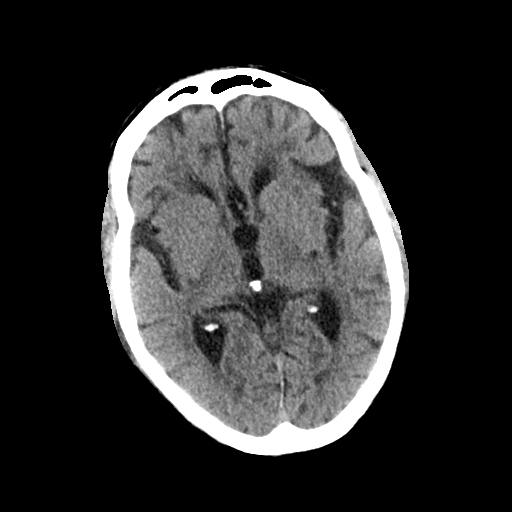
[im 16/36  bone]
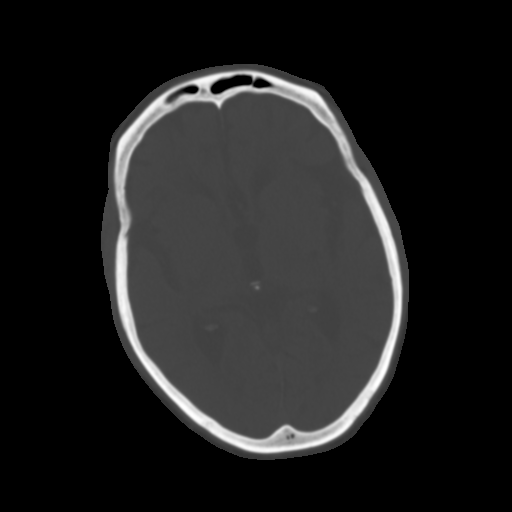
[im 20/36  brain]
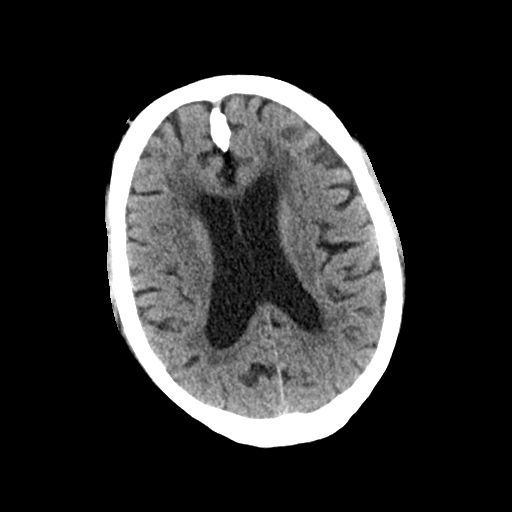
[im 23/36  brain]
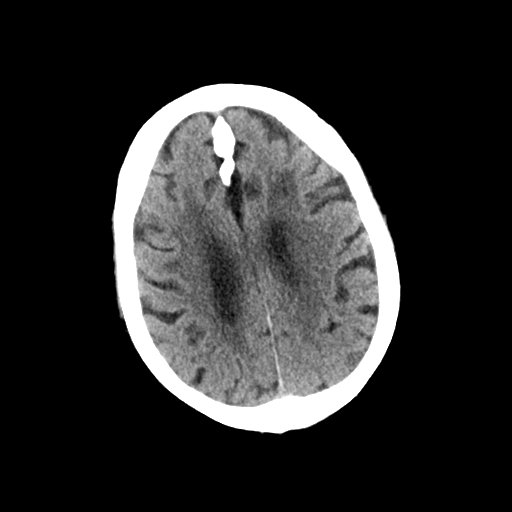
[im 27/36  brain]
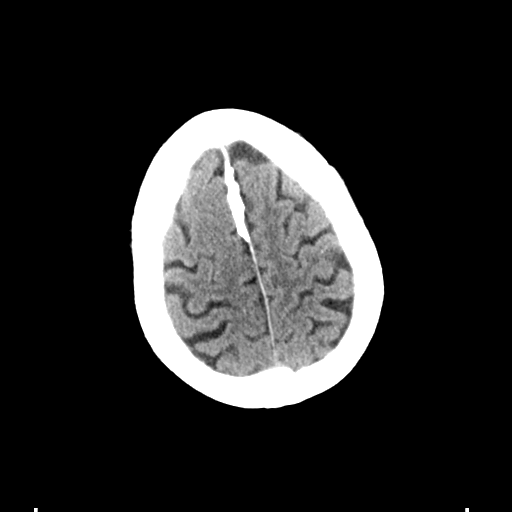
[im 29/36  brain]
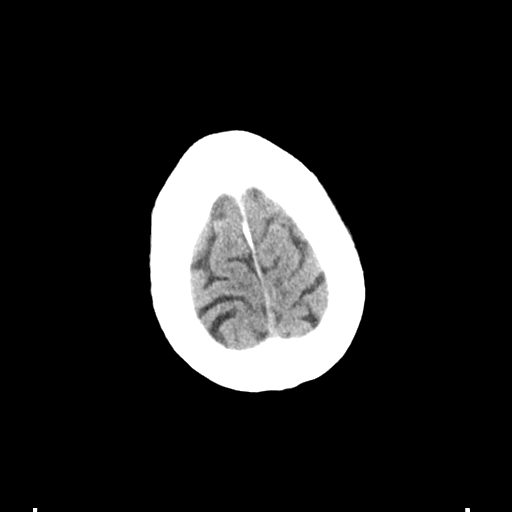
[im 29/36  bone]
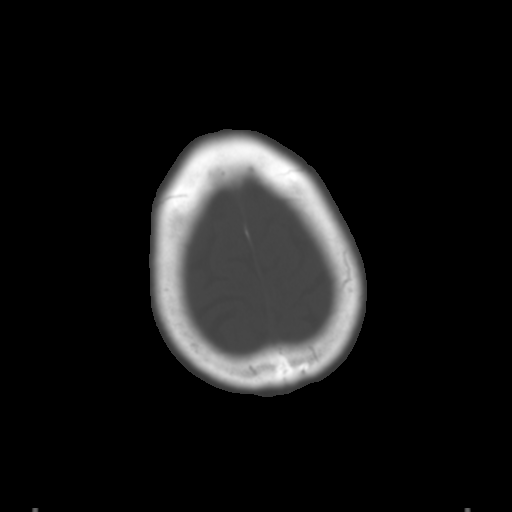
[im 33/36  brain]
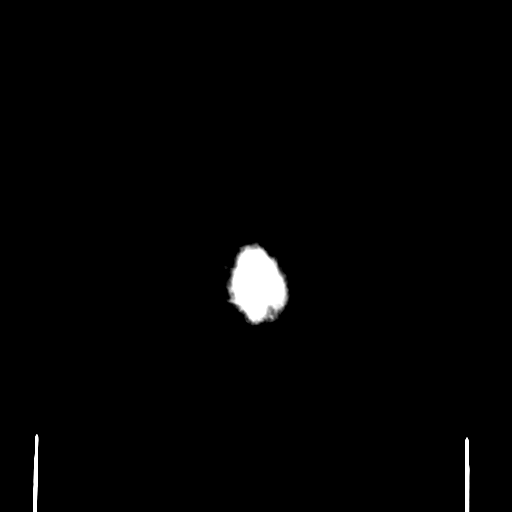

[Series 4: head 3.0 mpr cor · coronal · 0.35mm/px · 3 of 72 slices shown]
[im 24/72  brain]
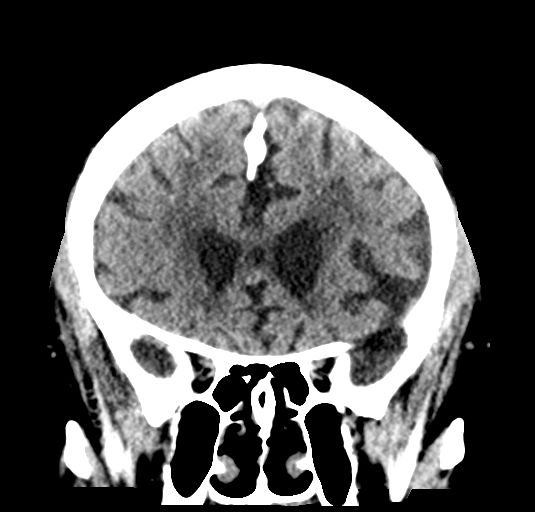
[im 32/72  brain]
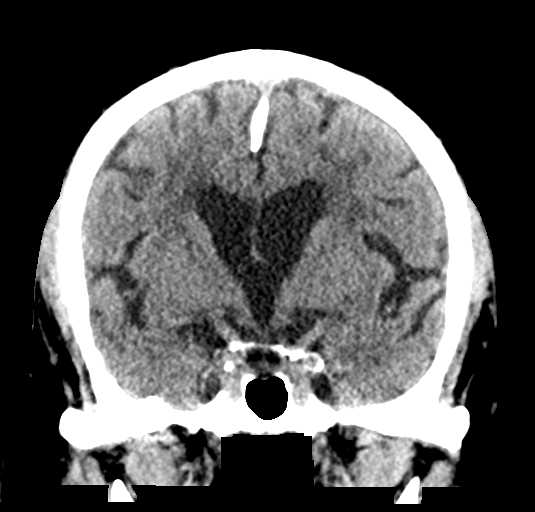
[im 40/72  brain]
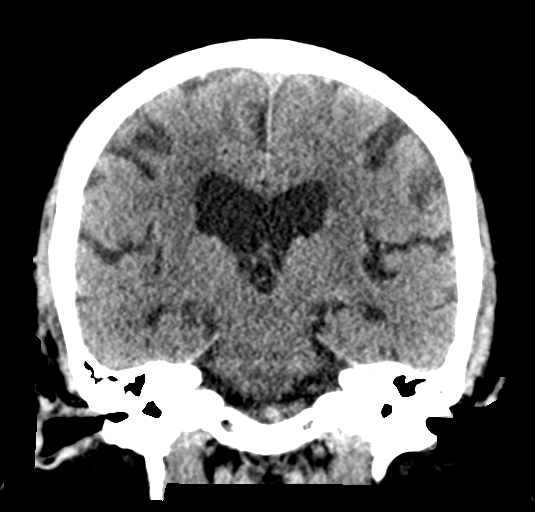

[Series 5: head 3.0 mpr sag · sagittal · 0.35mm/px · 3 of 67 slices shown]
[im 23/67  brain]
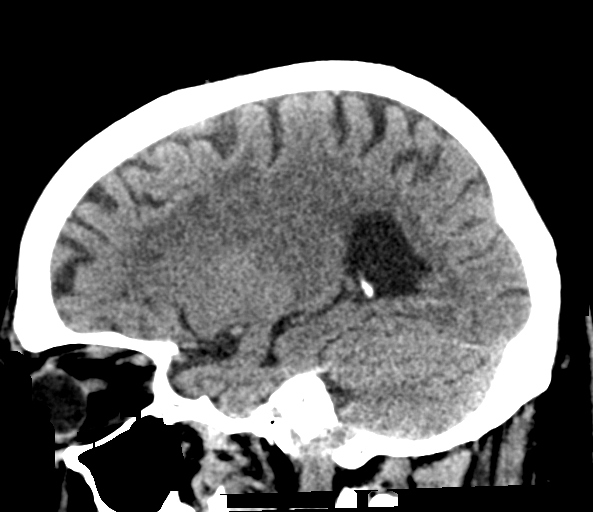
[im 34/67  brain]
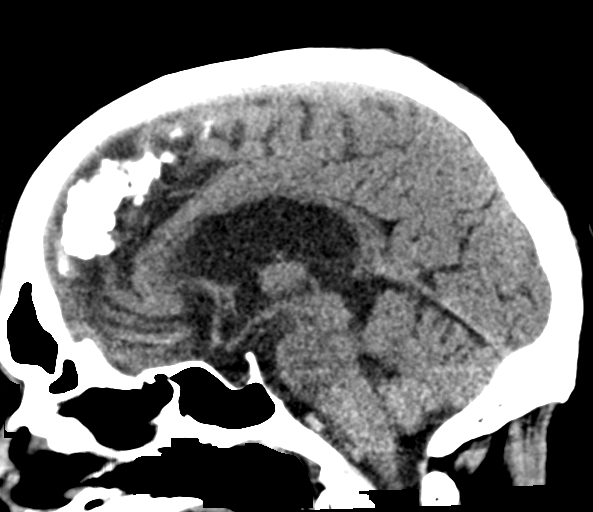
[im 45/67  brain]
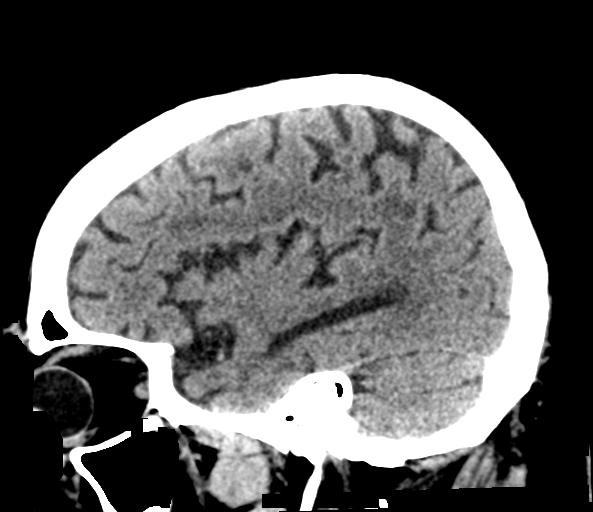

[16 of 47 positions shown; findings below may reference images not displayed]

FINDINGS: Brain: No evidence of acute infarction, hemorrhage, hydrocephalus,
extra-axial collection or mass lesion/mass effect. Chronic atrophic
changes and chronic white matter ischemic changes are seen. Lacunar
infarcts are noted along the superior aspect of the basal ganglia
bilaterally.

Vascular: No hyperdense vessel or unexpected calcification.

Skull: Normal. Negative for fracture or focal lesion.

Sinuses/Orbits: No acute finding.

Other: None.
IMPRESSION: Chronic atrophic changes and ischemic changes without acute
abnormality.

## 2021-01-14 MED ORDER — LACTATED RINGERS IV BOLUS
2000.0000 mL | Freq: Once | INTRAVENOUS | Status: AC
  Administered 2021-01-14: 2000 mL via INTRAVENOUS

## 2021-01-14 MED ORDER — WARFARIN - PHARMACIST DOSING INPATIENT: Freq: Every day | Status: DC

## 2021-01-14 MED ORDER — NOREPINEPHRINE 4 MG/250ML-% IV SOLN
INTRAVENOUS | Status: AC
  Administered 2021-01-14: 2 ug/min via INTRAVENOUS
  Filled 2021-01-14: qty 250

## 2021-01-14 MED ORDER — LACTATED RINGERS IV BOLUS
1000.0000 mL | Freq: Once | INTRAVENOUS | Status: AC
  Administered 2021-01-14: 1000 mL via INTRAVENOUS

## 2021-01-14 MED ORDER — WARFARIN SODIUM 4 MG PO TABS
8.0000 mg | ORAL_TABLET | Freq: Once | ORAL | Status: AC
  Administered 2021-01-14: 8 mg via ORAL
  Filled 2021-01-14: qty 2

## 2021-01-14 MED ORDER — POLYETHYLENE GLYCOL 3350 17 G PO PACK: 17.0000 g | PACK | Freq: Every day | ORAL | Status: DC | PRN

## 2021-01-14 MED ORDER — DOCUSATE SODIUM 100 MG PO CAPS
100.0000 mg | ORAL_CAPSULE | Freq: Two times a day (BID) | ORAL | Status: DC | PRN
  Administered 2021-01-15: 100 mg via ORAL
  Filled 2021-01-14: qty 1

## 2021-01-14 MED ORDER — POTASSIUM CHLORIDE 10 MEQ/100ML IV SOLN
10.0000 meq | INTRAVENOUS | Status: AC
  Administered 2021-01-14 (×2): 10 meq via INTRAVENOUS
  Filled 2021-01-14 (×2): qty 100

## 2021-01-14 MED ORDER — ASPIRIN EC 81 MG PO TBEC
81.0000 mg | DELAYED_RELEASE_TABLET | Freq: Every day | ORAL | Status: DC
  Administered 2021-01-15 – 2021-01-17 (×3): 81 mg via ORAL
  Filled 2021-01-14 (×3): qty 1

## 2021-01-14 MED ORDER — MAGNESIUM SULFATE 50 % IJ SOLN: 1.0000 g | Freq: Once | INTRAMUSCULAR | Status: DC

## 2021-01-14 MED ORDER — LACTATED RINGERS IV SOLN: INTRAVENOUS | Status: DC

## 2021-01-14 MED ORDER — MAGNESIUM SULFATE 2 GM/50ML IV SOLN
2.0000 g | Freq: Once | INTRAVENOUS | Status: AC
  Administered 2021-01-14: 2 g via INTRAVENOUS

## 2021-01-14 MED ORDER — NOREPINEPHRINE 4 MG/250ML-% IV SOLN: 0.0000 ug/min | INTRAVENOUS | Status: DC

## 2021-01-14 NOTE — ED Notes (Signed)
Pt c/o intense burning in wrist from IV potassium. Nurse slowed down the drip. Pt stated that the area is not burning at this time. Told pt to updated nurse if the drip starts burning again.

## 2021-01-14 NOTE — Consult Note (Deleted)
NAME:  Steven Barnett, MRN:  [id number], DOB:  1951/06/20, LOS: 0 ADMISSION DATE:  01/14/2021, CONSULTATION DATE:  7/14 REFERRING MD:  Dr. Ralene Bathe EDP, CHIEF COMPLAINT:  Hypotension   History of Present Illness:  70 year old male with PMH as below, which is significant for hypertension, atrial fibrillation on warfarin, HFpEF, and OSA. He presented to [place] on 7/14 with complaints of weakness x 3 days. He tells me he has been taking his blood pressure medications religiously including lasix. He says he is peeing constantly and has lost approximately 40 lbs in the last few months as a result. While climbing stairs he became light headed and experienced near-syncope. Upon arrival to the emergency department he was noted to be hypotensive with SBPs in the 70s. He was treated with IVF resuscitation 2L without improvement in the blood pressure so norepinephrine was initiated. He was treated under the SIRS/sepsis protocol, but there was no clear infectious source. There was concern for dehydration due to diuresis and antihypertensives so he was given more IVF. He was transferred to [facility] ED for critical are evaluation. PCCM asked to see upon arrival. He is feeling MUCH better at this point, than when he presented to the ED.   Pertinent  Medical History   has a past medical history of Anemia, Arthralgia of both knees, Arthritis, Atrial fibrillation ([name]), Cancer ([name]), Chronic anticoagulation (2010), Chronic diastolic CHF (congestive heart failure), NYHA class 2 ([name]), Dyspnea, Dysrhythmia, Hypertension, OSA (obstructive sleep apnea) (2016), Permanent atrial fibrillation ([name]) (2010), and Pneumonia (3/29/2017and june 2017).   Significant Hospital Events: Including procedures, antibiotic start and stop dates in addition to other pertinent events   7/14 admit  Interim History / Subjective:    Objective   Blood pressure 132/77, pulse 63, temperature 98.1 F (36.7 C), temperature source  Oral, resp. rate 12, height 5\' 10"  (1.778 m), weight 119.3 kg, SpO2 99 %.        Intake/Output Summary (Last 24 hours) at 01/14/2021 2024 Last data filed at 01/14/2021 1729 Gross per 24 hour  Intake 3050 ml  Output 200 ml  Net 2850 ml   Filed Weights   01/14/21 1509  Weight: 119.3 kg    Examination: General: Adult male of normal body habitus in NAD HENT: [place]/AT, PERRL, no JVD Lungs: Clear bilateral breath sounds.  Cardiovascular: IRIR, bradycardic in the 40s, no MRG Abdomen: Soft, non-tender, non-distended Extremities: No significant edema. Moving all 4 extremities.  Neuro: Somnolent, but arouses to voice and interacts appropriately.  GU: Voiding. 480mL clear yellow urine in my presence.   Resolved Hospital Problem list     Assessment & Plan:   Hypovolemic shock: improving. No clear infectious source, WBC 5, afebrile. Hypovolemic secondary to diuresis with significant weight loss. Improved with fluids. Lactic cleared with fluids. Norepinephrine being weaned off.  - Wean norepi to off - continue IVF hydration gently with respect to HFpEF.  - Defer antibiotics at this time - Holding home lasix, hydralazine, diltiazem, metolazone, metoprolol, valsartan.   Atrial fibrillation with slow ventricular rate: bradycardic to the 40s and 50s. Likely due to continued BB use in the setting of dehydration and renal failure. BP maintaining despite bradycardia as norepi being weaned off.  - Hold metoprolol - IVF - Telemetry monitoring - Continue warfarin, INR 2.5 on admit.   Acute renal failure: baseline creatinine seems to be around 1.24. 4.13 on presentation. Likely represents pre-renal azotemia in the setting of dehydration.  - Renal  ultrasound.  - Hold off on foley as he is voiding well on his own.  - Trend BMP  Hypokalemia Hypomagnesemia  - K and mag replaced in ED - Repeat labs  Hypertension HFpEF NYHA class 2  LVH  - holding outpatient meds as above - Telemetry  monitoring  Best Practice (right click and "Reselect all SmartList Selections" daily)   Diet/type: NPO w/ oral meds DVT prophylaxis: Coumadin GI prophylaxis: N/A Lines: N/A Foley:  N/A Code Status:  full code Last date of multidisciplinary goals of care discussion [ ]   Labs   CBC: Recent Labs  Lab 01/14/21 1519 01/14/21 1559  WBC 5.4  --   NEUTROABS 3.5  --   HGB 13.1 11.6*  HCT 40.1 34.0*  MCV 84.6  --   PLT 141*  --     Basic Metabolic Panel: Recent Labs  Lab 01/14/21 1519 01/14/21 1559  NA 135 140  K 3.1* 2.5*  CL 93*  --   CO2 31  --   GLUCOSE 136*  --   BUN 74*  --   CREATININE 4.13*  --   CALCIUM 8.9  --   MG 1.6*  --    GFR: Estimated Creatinine Clearance: 21.8 mL/min (A) (by C-G formula based on SCr of 4.13 mg/dL (H)). Recent Labs  Lab 01/14/21 1519 01/14/21 1546 01/14/21 1722  WBC 5.4  --   --   LATICACIDVEN  --  3.3* 2.0*    Liver Function Tests: Recent Labs  Lab 01/14/21 1519  AST 27  ALT 15  ALKPHOS 101  BILITOT 0.6  PROT 7.2  ALBUMIN 4.1   No results for input(s): LIPASE, AMYLASE in the last 168 hours. Recent Labs  Lab 01/14/21 1545  AMMONIA 35    ABG    Component Value Date/Time   PHART 7.352 08/07/2016 0315   PCO2ART 22.2 (L) 08/07/2016 0315   PO2ART 133 (H) 08/07/2016 0315   HCO3 27.4 01/14/2021 1559   TCO2 29 01/14/2021 1559   ACIDBASEDEF 11.7 (H) 08/07/2016 0315   O2SAT 87.0 01/14/2021 1559     Coagulation Profile: Recent Labs  Lab 01/14/21 1519  INR 2.5*    Cardiac Enzymes: No results for input(s): CKTOTAL, CKMB, CKMBINDEX, TROPONINI in the last 168 hours.  HbA1C: Hgb A1c MFr Bld  Date/Time Value Ref Range Status  04/07/2016 10:06 AM 4.8 4.8 - 5.6 % Final    Comment:    (NOTE)         Pre-diabetes: 5.7 - 6.4         Diabetes: >6.4         Glycemic control for adults with diabetes: <7.0     CBG: Recent Labs  Lab 01/14/21 1532 01/14/21 1711  GLUCAP 132* 135*    Review of Systems:   Bolds  are positive  Constitutional: weight loss, gain, night sweats, Fevers, chills, fatigue .  HEENT: headaches, Sore throat, sneezing, nasal congestion, post nasal drip, Difficulty swallowing, Tooth/dental problems, visual complaints visual changes, ear ache CV:  chest pain, radiates:,Orthopnea, PND, swelling in lower extremities**, dizziness, palpitations, syncope.  GI  heartburn, indigestion, abdominal pain, nausea, vomiting, diarrhea, change in bowel habits, loss of appetite, bloody stools.  Resp: cough, productive: , hemoptysis, dyspnea, chest pain, pleuritic.  Skin: rash or itching or icterus GU: dysuria, change in color of urine, urgency or frequency. flank pain, hematuria  MS: joint pain or swelling. decreased range of motion  Psych: change in mood or affect. depression or anxiety.  Neuro: difficulty with speech, weakness, numbness, ataxia    Past Medical History:  He,  has a past medical history of Anemia, Arthralgia of both knees, Arthritis, Atrial fibrillation ([place]), Cancer ([place]), Chronic anticoagulation (2010), Chronic diastolic CHF (congestive heart failure), NYHA class 2 ([place]), Dyspnea, Dysrhythmia, Hypertension, OSA (obstructive sleep apnea) (2016), Permanent atrial fibrillation ([place]) (2010), and Pneumonia (3/29/2017and june 2017).   Surgical History:   Past Surgical History:  Procedure Laterality Date   COLONOSCOPY WITH PROPOFOL N/A 03/04/2016   Procedure: COLONOSCOPY WITH PROPOFOL;  Surgeon: Carol Ada, MD;  Location: [facility];  Service: Endoscopy;  Laterality: N/A;   COLONOSCOPY WITH PROPOFOL N/A 09/15/2017   Procedure: COLONOSCOPY WITH PROPOFOL;  Surgeon: Carol Ada, MD;  Location: [facility];  Service: Endoscopy;  Laterality: N/A;   LAPAROSCOPIC PARTIAL COLECTOMY N/A 04/12/2016   Procedure: LAPAROSCOPIC ILEOCOLECTOMY;  Surgeon: [name], MD;  Location: [place];  Service: General;  Laterality: N/A;   PORT-A-CATH REMOVAL N/A 02/16/2017   Procedure: REMOVAL  PORT-A-CATH;  Surgeon: [name], MD;  Location: [place];  Service: General;  Laterality: N/A;   PORTACATH PLACEMENT N/A 05/18/2016   Procedure: INSERTION PORT-A-CATH;  Surgeon: [name], MD;  Location: [place];  Service: General;  Laterality: N/A;   THORACOTOMY Right 2010     Social History:   reports that he quit smoking about 46 years ago. His smoking use included cigarettes. He has a 0.20 pack-year smoking history. He has never used smokeless tobacco. He reports that he does not drink alcohol and does not use drugs.   Family History:  His family history includes Bone cancer in his maternal uncle; Cancer in his maternal uncle; Colon cancer in his sister and sister; Colon cancer (age of onset: 30) in his sister; Colon polyps in his brother, father, and mother; Dementia in his mother; Diabetes in his mother; Heart disease in his father; Heart failure in his father; Hyperlipidemia in his mother; Hypertension in his mother; Lung cancer in his paternal grandfather; Stroke in his maternal grandmother and sister. There is no history of Heart attack.   Allergies No Known Allergies   Home Medications  Prior to Admission medications   Medication Sig Start Date End Date Taking? Authorizing Provider  acetaminophen (TYLENOL) 500 MG tablet Take 1,000 mg by mouth 2 (two) times daily.    [provider]  albuterol (PROVENTIL HFA;VENTOLIN HFA) 108 (90 Base) MCG/ACT inhaler Inhale 2 puffs into the lungs every 6 (six) hours as needed for wheezing or shortness of breath. 10/01/15   Nita Sells, MD  aspirin EC 81 MG tablet Take 81 mg by mouth daily.    [provider]  colchicine 0.6 MG tablet Take 0.6 mg by mouth 2 (two) times daily.    [provider]  diltiazem (CARDIZEM) 120 MG tablet TAKE 1 TABLET BY MOUTH ONCE DAILY 12/13/17   Hilty, [name], MD  furosemide (LASIX) 80 MG tablet Take 1 tablet (80 mg total) by mouth 2 (two) times daily. 09/09/20    Hilty, [name], MD  hydrALAZINE (APRESOLINE) 100 MG tablet Take 1 tablet (100 mg total) by mouth 2 (two) times daily. 09/11/20   Hilty, [name], MD  HYDROcodone-acetaminophen (NORCO/VICODIN) 5-325 MG tablet Take 1 tablet by mouth every 4 (four) hours as needed. 08/15/20   Isla Pence, MD  metolazone (ZAROXOLYN) 5 MG tablet TAKE 1 TABLET BY MOUTH TWICE A WEEK ON  [facility]  AND  SATURDAYS 09/14/20   Hilty, [name], MD  metoprolol tartrate (LOPRESSOR) 25 MG tablet Take 1 tablet (25 mg total) by mouth 2 (two) times daily. 09/15/20   Hilty, [name], MD  MITIGARE 0.6 MG CAPS  11/07/19   [provider]  potassium chloride SA (KLOR-CON) 20 MEQ tablet TAKE 1  BY MOUTH ONCE DAILY 09/15/20   Hilty, [name], MD  predniSONE (STERAPRED UNI-PAK 21 TAB) 10 MG (21) TBPK tablet Take 6 tabs for 2 days, then 5 for 2 days, then 4 for 2 days, then 3 for 2 days, 2 for 2 days, then 1 for 2 days 08/15/20   Isla Pence, MD  Turmeric (QC TUMERIC COMPLEX PO) Take 1 tablet by mouth daily.    [provider]  valsartan (DIOVAN) 320 MG tablet Take 1 tablet (320 mg total) by mouth daily. 09/14/20   Pixie Casino, MD  warfarin (COUMADIN) 4 MG tablet TAKE 1 TO 2 TABLETS AS DIRECTED BY COUMADIN CLINIC 12/09/20   Hilty, [name], MD     Critical care time: 36 min     Georgann Housekeeper, AGACNP-BC [facility]  See Amion for personal pager PCCM on call pager [phone] until 7pm. Please call [place] 7p-7a. [phone]  01/14/2021 9:16 PM

## 2021-01-14 NOTE — Progress Notes (Addendum)
ANTICOAGULATION CONSULT NOTE - Initial Consult  Pharmacy Consult for Warfarin Indication: atrial fibrillation  No Known Allergies  Patient Measurements: Height: 5\' 10"  (177.8 cm) Weight: 119.3 kg (263 lb) IBW/kg (Calculated) : 73  Vital Signs: Temp: 98.1 F (36.7 C) (07/14 1858) Temp Source: Oral (07/14 1858) BP: 101/70 (07/14 2147) Pulse Rate: 70 (07/14 2147)  Labs: Recent Labs    01/14/21 1519 01/14/21 1559 01/14/21 1722  HGB 13.1 11.6*  --   HCT 40.1 34.0*  --   PLT 141*  --   --   LABPROT 26.6*  --   --   INR 2.5*  --   --   CREATININE 4.13*  --   --   TROPONINIHS 13  --  9    Estimated Creatinine Clearance: 21.8 mL/min (A) (by C-G formula based on SCr of 4.13 mg/dL (H)).   Medical History: Past Medical History:  Diagnosis Date   Anemia    Arthralgia of both knees    Arthritis    "right knee" (10/01/2015)   Atrial fibrillation ([place])    Cancer ([place])    STAGE 1 COLON CANCER   Chronic anticoagulation 2010   Coumadin   Chronic diastolic CHF (congestive heart failure), NYHA class 2 ([facility])    Dyspnea    Dysrhythmia    Hypertension    OSA (obstructive sleep apnea) 2016   "couldn't take the mask during the testing" (10/01/2015)   Permanent atrial fibrillation ([place]) 2010   Pneumonia 3/29/2017and june 2017    Medications:  (Not in a hospital admission)  Scheduled:   [START ON 01/15/2021] aspirin EC  81 mg Oral Daily   warfarin  8 mg Oral Once   [START ON 01/15/2021] Warfarin - Pharmacist Dosing Inpatient   Does not apply q1600   Infusions:   lactated ringers 200 mL/hr at 01/14/21 1743    Assessment: 42 yom with PMH of hypertension, atrial fibrillation on warfarin, HFpEF, and OSA. Patient being evaluated for hypovolemic shock secondary to diuresis and significant weight loss.  Patient takes 4 mg tablet by mouth on Monday, Wednesday and Fridays, then take two of the 4 mg tablets to make total of 8 mg by mouth all other days per patient. Last taken 7/13 per  patient.  INR 2.5 today. CBC stable.  Goal of Therapy:  INR 2-3 Monitor platelets by anticoagulation protocol: Yes   Plan:  Will give usual 8mg  dose tonight with plan to resume usual dosing tomorrow if INR permits Daily INR  Heloise Purpura 01/14/2021,10:04 PM

## 2021-01-14 NOTE — ED Notes (Signed)
Pt placed on pacer pads with monitor at bedside. Placed code cart outside door as precaution. Pt tolerated pads well. Daughter at bedside at this time.

## 2021-01-14 NOTE — ED Notes (Signed)
Pt move alert now. Pt able to hold conversation and interact with son and staff.

## 2021-01-14 NOTE — ED Provider Notes (Signed)
[facility] EMERGENCY DEPARTMENT Provider Note   CSN: [id number] Arrival date & time: 01/14/21  1455     History Chief Complaint  Patient presents with   Dizziness    Steven Barnett is a 70 y.o. male.  HPI Patient reports that he has felt very weak for about 3 days.  He reports today he got quite weak when he was climbing some stairs and almost passed out.  He denies he has had an actual loss of consciousness or fall.  He denies he has been having any specific pain.  He reports in the mornings when he wakes up his hands are really stiff and he thinks that his arthritis but denies he has been experiencing any chest pain or headache.  He reports he has felt somewhat short of breath.  Patient was seen at GI yesterday for free evaluation for a screening colonoscopy.  His daughter reports that his blood pressure was in the 86P systolic yesterday but at that time no recommendations were made for change of treatment course.  Patient has chronic atrial fibrillation.  This is been longstanding.  He reports he takes Coumadin.  He reports his Coumadin dose was recently decreased due to an elevated level.  He reports is taking it Monday Wednesday and Friday now.  He denies noticing that he is having any very dark black or bloody looking bowel movement.  He denies he is experiencing abdominal pain.  He has not been vomiting.  He is able to eat and drink.  He has continued to take all of his prescribed medications including diuretics and several blood pressure medications despite feeling very generally weak over these past couple of days.  He is reports he has continued to eat and drink so she was not thinking that he was necessarily dehydrated.  No fever.  No cough.    Past Medical History:  Diagnosis Date   Anemia    Arthralgia of both knees    Arthritis    "right knee" (10/01/2015)   Atrial fibrillation ([place])    Cancer ([place])    STAGE 1 COLON CANCER   Chronic anticoagulation 2010    Coumadin   Chronic diastolic CHF (congestive heart failure), NYHA class 2 (HCC)    Dyspnea    Dysrhythmia    Hypertension    OSA (obstructive sleep apnea) 2016   "couldn't take the mask during the testing" (10/01/2015)   Permanent atrial fibrillation ([place]) 2010   Pneumonia 3/29/2017and june 2017    Patient Active Problem List   Diagnosis Date Noted   Encounter for therapeutic drug monitoring 10/12/2018   Hypotension 08/17/2016   SIRS (systemic inflammatory response syndrome) ([place]) 08/17/2016   Influenza A 08/17/2016   Septic shock ([place]) 08/10/2016   Neutropenia, drug-induced ([place])    Gastroenteritis due to norovirus    AKI (acute kidney injury) ([place])    Genetic testing 07/01/2016   Port catheter in place 06/29/2016   Family history of colon cancer 06/08/2016   Family history of colonic polyps 06/08/2016   Cancer of right colon ([place]) 04/12/2016   Pre-operative cardiovascular examination 04/06/2016   CAP (community acquired pneumonia) 02/02/2016   Acute on chronic diastolic heart failure ([place]) 02/02/2016   Acute respiratory failure with hypoxia ([place]) 02/02/2016   Anemia, iron deficiency 02/02/2016   Current use of long term anticoagulation 12/03/2015   Pneumonia 09/30/2015   PNA (pneumonia) 09/30/2015   Acute on chronic congestive heart failure (HCC)    CHF exacerbation ([place])  07/08/2015   Community acquired pneumonia 02/10/2015   ED (erectile dysfunction) 08/12/2014   Arthralgia of both knees 04/08/2014   Chronic diastolic (congestive) heart failure ([place]) 01/14/2009   Morbid obesity ([place]) 12/31/2008   Obstructive sleep apnea 12/31/2008   Essential hypertension, benign 12/31/2008   Permanent atrial fibrillation ([place]) 12/31/2008    Past Surgical History:  Procedure Laterality Date   COLONOSCOPY WITH PROPOFOL N/A 03/04/2016   Procedure: COLONOSCOPY WITH PROPOFOL;  Surgeon: Carol Ada, MD;  Location: [facility];  Service: Endoscopy;  Laterality: N/A;   COLONOSCOPY WITH  PROPOFOL N/A 09/15/2017   Procedure: COLONOSCOPY WITH PROPOFOL;  Surgeon: Carol Ada, MD;  Location: [facility];  Service: Endoscopy;  Laterality: N/A;   LAPAROSCOPIC PARTIAL COLECTOMY N/A 04/12/2016   Procedure: LAPAROSCOPIC ILEOCOLECTOMY;  Surgeon: [name], MD;  Location: [facility] OR;  Service: General;  Laterality: N/A;   PORT-A-CATH REMOVAL N/A 02/16/2017   Procedure: REMOVAL PORT-A-CATH;  Surgeon: [name], MD;  Location: [facility] OR;  Service: General;  Laterality: N/A;   PORTACATH PLACEMENT N/A 05/18/2016   Procedure: INSERTION PORT-A-CATH;  Surgeon: [name], MD;  Location: [facility];  Service: General;  Laterality: N/A;   THORACOTOMY Right 2010       Family History  Problem Relation Age of Onset   Heart disease Father    Heart failure Father    Colon polyps Father        unspecified number - pt of intestine surgically resected   Hypertension Mother    Diabetes Mother    Hyperlipidemia Mother    Colon polyps Mother        unspecified number - pt of intestine surgically resected   Dementia Mother        d. 43   Stroke Maternal Grandmother    Stroke Sister    Colon cancer Sister 57       w/ "cancerous polyps" - unspecified number; underwent surgery and radiation   Colon polyps Brother        unspecified number - polypectomies   Colon cancer Sister        dx 78-60; s/p surgery and radiation   Bone cancer Maternal Uncle        dx. 65s   Lung cancer Paternal Grandfather        d. 49s-90s; lung cancer vs TB   Colon cancer Sister        dx. 59-60; s/p surgery and radiation   Cancer Maternal Uncle        mother had about 7-8 other siblings, most passed at older ages, many of whom had some type of cancer   Heart attack Neg Hx     Social History   Tobacco Use   Smoking status: Former    Packs/day: 0.10    Years: 2.00    Pack years: 0.20    Types: Cigarettes    Quit date: 12/16/1974    Years since quitting: 46.1   Smokeless tobacco: Never    Tobacco comments:    "1 pack cigarettes would last me a month"  Vaping Use   Vaping Use: Never used  Substance Use Topics   Alcohol use: No   Drug use: No    Types: Cocaine, Marijuana    Comment: Hx polysubstance abuse, quit 2008    Home Medications Prior to Admission medications   Medication Sig Start Date End Date Taking? Authorizing Provider  acetaminophen (TYLENOL) 500 MG tablet Take 1,000 mg by mouth 2 (two) times daily.  [provider]  albuterol (PROVENTIL HFA;VENTOLIN HFA) 108 (90 Base) MCG/ACT inhaler Inhale 2 puffs into the lungs every 6 (six) hours as needed for wheezing or shortness of breath. 10/01/15   Nita Sells, MD  aspirin EC 81 MG tablet Take 81 mg by mouth daily.    [provider]  colchicine 0.6 MG tablet Take 0.6 mg by mouth 2 (two) times daily.    [provider]  diltiazem (CARDIZEM) 120 MG tablet TAKE 1 TABLET BY MOUTH ONCE DAILY 12/13/17   Hilty, [name], MD  furosemide (LASIX) 80 MG tablet Take 1 tablet (80 mg total) by mouth 2 (two) times daily. 09/09/20   Hilty, [name], MD  hydrALAZINE (APRESOLINE) 100 MG tablet Take 1 tablet (100 mg total) by mouth 2 (two) times daily. 09/11/20   Hilty, [name], MD  HYDROcodone-acetaminophen (NORCO/VICODIN) 5-325 MG tablet Take 1 tablet by mouth every 4 (four) hours as needed. 08/15/20   Isla Pence, MD  metolazone (ZAROXOLYN) 5 MG tablet TAKE 1 TABLET BY MOUTH TWICE A WEEK ON  [facility]  AND  SATURDAYS 09/14/20   Hilty, [name], MD  metoprolol tartrate (LOPRESSOR) 25 MG tablet Take 1 tablet (25 mg total) by mouth 2 (two) times daily. 09/15/20   Hilty, [name], MD  MITIGARE 0.6 MG CAPS  11/07/19   [provider]  potassium chloride SA (KLOR-CON) 20 MEQ tablet TAKE 1  BY MOUTH ONCE DAILY 09/15/20   Hilty, [name], MD  predniSONE (STERAPRED UNI-PAK 21 TAB) 10 MG (21) TBPK tablet Take 6 tabs for 2 days, then 5 for 2 days, then 4 for 2 days, then 3 for 2 days, 2 for 2 days,  then 1 for 2 days 08/15/20   Isla Pence, MD  Turmeric (QC TUMERIC COMPLEX PO) Take 1 tablet by mouth daily.    [provider]  valsartan (DIOVAN) 320 MG tablet Take 1 tablet (320 mg total) by mouth daily. 09/14/20   Pixie Casino, MD  warfarin (COUMADIN) 4 MG tablet TAKE 1 TO 2 TABLETS AS DIRECTED BY COUMADIN CLINIC 12/09/20   Hilty, [name], MD    Allergies    Patient has no known allergies.  Review of Systems   Review of Systems 10 systems reviewed and negative except as per HPI Physical Exam Updated Vital Signs BP 107/66 (BP Location: Left Arm)   Pulse (!) 47   Temp 98.2 F (36.8 C) (Oral)   Resp 15   Ht 5\' 10"  (1.778 m)   Wt 119.3 kg   SpO2 100%   BMI 37.74 kg/m   Physical Exam Constitutional:      Comments: Patient's mental status is clear and situationally appropriate.  He does appear pale and complains of feeling lightheaded.  He does not have respiratory distress at rest.  He is lying supine in the stretcher.  HENT:     Head: Normocephalic and atraumatic.     Mouth/Throat:     Mouth: Mucous membranes are moist.     Pharynx: Oropharynx is clear.  Eyes:     Extraocular Movements: Extraocular movements intact.     Pupils: Pupils are equal, round, and reactive to light.  Cardiovascular:     Comments: Monitor shows rate controlled atrial fibrillation in the 70s.  Narrow complex.  Auscultation is for regular sounding heartbeat without gross murmur or gallop.  Patient does have 1+ palpable radial pulse. Pulmonary:     Effort: Pulmonary effort is normal.  Breath sounds: Normal breath sounds.  Abdominal:     General: There is no distension.     Palpations: Abdomen is soft.     Tenderness: There is no abdominal tenderness. There is no guarding.  Genitourinary:    Comments: Rectal exam is for brownish-yellow stool.  No melena or visible blood. Musculoskeletal:        General: No swelling or tenderness. Normal range of motion.     Cervical back: Neck  supple.     Right lower leg: No edema.     Left lower leg: No edema.  Skin:    General: Skin is warm and dry.     Coloration: Skin is pale.  Neurological:     General: No focal deficit present.     Mental Status: He is oriented to person, place, and time.     Motor: No weakness.     Coordination: Coordination normal.     Comments: Patient does not have focal motor deficit.  He is situationally appropriate.  He is generally weak.  But cannot cooperate for repositioning in the stretcher and moving all extremities at command.  Psychiatric:        Mood and Affect: Mood normal.    ED Results / Procedures / Treatments   Labs (all labs ordered are listed, but only abnormal results are displayed) Labs Reviewed  BASIC METABOLIC PANEL - Abnormal; Notable for the following components:      Result Value   Potassium 3.1 (*)    Chloride 93 (*)    Glucose, Bld 136 (*)    BUN 74 (*)    Creatinine, Ser 4.13 (*)    GFR, Estimated 15 (*)    All other components within normal limits  BRAIN NATRIURETIC PEPTIDE - Abnormal; Notable for the following components:   B Natriuretic Peptide 109.4 (*)    All other components within normal limits  LACTIC ACID, PLASMA - Abnormal; Notable for the following components:   Lactic Acid, Venous 3.3 (*)    All other components within normal limits  CBC WITH DIFFERENTIAL/PLATELET - Abnormal; Notable for the following components:   RDW 15.9 (*)    Platelets 141 (*)    All other components within normal limits  PROTIME-INR - Abnormal; Notable for the following components:   Prothrombin Time 26.6 (*)    INR 2.5 (*)    All other components within normal limits  MAGNESIUM - Abnormal; Notable for the following components:   Magnesium 1.6 (*)    All other components within normal limits  CBG MONITORING, ED - Abnormal; Notable for the following components:   Glucose-Capillary 132 (*)    All other components within normal limits  I-STAT VENOUS BLOOD GAS, ED -  Abnormal; Notable for the following components:   pO2, Ven 53.0 (*)    Potassium 2.5 (*)    Calcium, Ion 1.05 (*)    HCT 34.0 (*)    Hemoglobin 11.6 (*)    All other components within normal limits  RESP PANEL BY RT-PCR (FLU A&B, COVID) ARPGX2  CULTURE, BLOOD (ROUTINE X 2)  CULTURE, BLOOD (ROUTINE X 2)  D-DIMER, QUANTITATIVE  AMMONIA  HEPATIC FUNCTION PANEL  OCCULT BLOOD X 1 CARD TO LAB, STOOL  LACTIC ACID, PLASMA  URINALYSIS, ROUTINE W REFLEX MICROSCOPIC  BLOOD GAS, VENOUS  TSH  POC OCCULT BLOOD, ED  CBG MONITORING, ED  TROPONIN I (HIGH SENSITIVITY)  TROPONIN I (HIGH SENSITIVITY)    EKG EKG Interpretation  Date/Time:  Thursday  January 14 2021 15:19:14 EDT Ventricular Rate:  72 PR Interval:    QRS Duration: 96 QT Interval:  508 QTC Calculation: 556 R Axis:   36 Text Interpretation: Atrial fibrillation Borderline low voltage, extremity leads Minimal ST elevation, inferior leads no sig change from previous Confirmed by Charlesetta Shanks [phone]) on 01/14/2021 3:38:18 PM  Radiology DG Chest Port 1 View  Result Date: 01/14/2021 CLINICAL DATA:  Lightheadedness hypotension. EXAM: PORTABLE CHEST 1 VIEW COMPARISON:  CT chest dated January 28, 2019. Chest x-ray dated August 19, 2016. FINDINGS: Stable mild cardiomegaly. Normal pulmonary vascularity. Unchanged chronic scarring at the right lung base and costophrenic angle. No focal consolidation, pleural effusion, or pneumothorax. No acute osseous abnormality. IMPRESSION: No active disease. Electronically Signed   By: Titus Dubin M.D.   On: 01/14/2021 16:31    Procedures Procedures  CRITICAL CARE Performed by: Charlesetta Shanks   Total critical care time: 60 minutes  Critical care time was exclusive of separately billable procedures and treating other patients.  Critical care was necessary to treat or prevent imminent or life-threatening deterioration.  Critical care was time spent personally by me on the following activities:  development of treatment plan with patient and/or surrogate as well as nursing, discussions with consultants, evaluation of patient's response to treatment, examination of patient, obtaining history from patient or surrogate, ordering and performing treatments and interventions, ordering and review of laboratory studies, ordering and review of radiographic studies, pulse oximetry and re-evaluation of patient's condition.  Medications Ordered in ED Medications  norepinephrine (LEVOPHED) 4mg  in 252mL premix infusion (6 mcg/min Intravenous Rate/Dose Change 01/14/21 1624)  potassium chloride 10 mEq in 100 mL IVPB ( Intravenous Rate/Dose Change 01/14/21 1704)  lactated ringers infusion (has no administration in time range)  magnesium sulfate IVPB 2 g 50 mL (2 g Intravenous New Bag/Given 01/14/21 1653)  lactated ringers bolus 2,000 mL (0 mLs Intravenous Stopped 01/14/21 1630)  lactated ringers bolus 1,000 mL (1,000 mLs Intravenous New Bag/Given 01/14/21 1637)    ED Course  I have reviewed the triage vital signs and the nursing notes.  Pertinent labs & imaging results that were available during my care of the patient were reviewed by me and considered in my medical decision making (see chart for details).  Clinical Course as of 01/14/21 1711  Thu Jan 14, 2021  1636 Patient has completed 2 L of IV fluid with third infusing.  Levophed is at 6 mics per minute.  Systolic blood pressure 562.  Mental status remains clear.  Heart rate continues to be atrial fibrillation at rate of 60.  Metabolic panel has returned with acute kidney failure likely due to medications.  Patient has had all of his blood pressure medications and diuretics this morning as prescribed.  We will continue with third liter of lactated ringer and try to titrate down Levophed as fluids infuse as tolerated. [MP]    Clinical Course User Index [MP] Charlesetta Shanks, MD   MDM Rules/Calculators/A&P                          Patient presents with  near syncope and significantly hypotensive.  Patient's mental status is clear and he does not exhibit focal neurologic deficit.  He does not endorse chest pain, headache or abdominal pain.  Rhythm is a rate controlled atrial fibrillation narrow complex.  Review of EMR indicates this is longstanding chronic.  Patient is anticoagulated on Coumadin.  Rectal exam done at bedside and  no appearance of melena or blood.  No chest pain abdominal pain or back pain to suggest dissection or acute MI.  EKG immediately interpreted as narrow complex and unchanged from previous tracings.  Patient's review of systems is not highly suggestive of sepsis.  Fluid resuscitation initiated with 2 L LR bolus.  Patient does remain hypotensive and systolics of 78L.  Will initiate Levophed.  Working diagnosis at this time includes SIRS\sepsis, significant volume depletion and hypotension from medication effect, PE is within differential but seems less likely without chest pain or hypoxia.  We will proceed with diagnostic evaluation and supportive care.  At this time patient's airway and mental status are stable.  Plan at this time for admission and continued diagnostic w/u in process.  Chemistry panel is consistent with acute kidney failure.  Patient does not show signs of sepsis.  He does not have focal pain.  At this time I have suspicion for excessive volume depletion with diuretics and hypotension cumulative with antihypertensive medications.  Patient is making positive response to rehydration and Levophed.  Consultation: Reviewed with intensivist Dr. Erin Fulling.  Agrees with continuing Levophed and hydration.  If needed for heart rate could consider addition of dopamine.  Due to lack of available ICU bed for transfer, patient will be transferred ED to ED for urgent\emergent evaluation by intensivist and management.  Dr. Tomi Bamberger excepts for transfer to [facility] emergency department.  Patient's airway mental status remained stable.  He is  showing positive response to treatment at this time.  Anticipate transfer. Final Clinical Impression(s) / ED Diagnoses Final diagnoses:  Symptomatic hypotension  Near syncope  Hypokalemia  Hypomagnesemia  Longstanding persistent atrial fibrillation [facility])    Rx / DC Orders ED Discharge Orders     None        Charlesetta Shanks, MD 01/14/21 1711

## 2021-01-14 NOTE — ED Notes (Signed)
Provider updated about pts low BP. Provider heading to bedside now to evaluate pt and place orders on pt. Pt resting in stretcher with VS cycling, daughter at bedside.

## 2021-01-14 NOTE — ED Provider Notes (Signed)
Patient transferred from [facility] for hypotension/bradycardia and AKI on pressors. Patient awaiting critical care evaluation. On ED arrival patient has received three letters of fluids. He states he still feels weak. His maintaining well. He is in a fib heart rate in the 30s to 50s. Blood pressure around 567 systolic on 4 g of labor fed. Critical care consulted.   Quintella Reichert, MD 01/15/21 [phone]

## 2021-01-14 NOTE — H&P (Signed)
NAME:  Steven Barnett, MRN:  [id number], DOB:  1950-09-20, LOS: 0 ADMISSION DATE:  01/14/2021, CONSULTATION DATE:  7/14 REFERRING MD:  Dr. Ralene Bathe EDP, CHIEF COMPLAINT:  Hypotension   History of Present Illness:  70 year old male with PMH as below, which is significant for hypertension, atrial fibrillation on warfarin, HFpEF, and OSA. He presented to [place] on 7/14 with complaints of weakness x 3 days. He tells me he has been taking his blood pressure medications religiously including lasix. He says he is peeing constantly and has lost approximately 40 lbs in the last few months as a result. While climbing stairs he became light headed and experienced near-syncope. Upon arrival to the emergency department he was noted to be hypotensive with SBPs in the 70s. He was treated with IVF resuscitation 2L without improvement in the blood pressure so norepinephrine was initiated. He was treated under the SIRS/sepsis protocol, but there was no clear infectious source. There was concern for dehydration due to diuresis and antihypertensives so he was given more IVF. He was transferred to [facility] ED for critical are evaluation. PCCM asked to see upon arrival. He is feeling MUCH better at this point, than when he presented to the ED.   Pertinent  Medical History   has a past medical history of Anemia, Arthralgia of both knees, Arthritis, Atrial fibrillation ([place]), Cancer ([place]), Chronic anticoagulation (2010), Chronic diastolic CHF (congestive heart failure), NYHA class 2 ([place]), Dyspnea, Dysrhythmia, Hypertension, OSA (obstructive sleep apnea) (2016), Permanent atrial fibrillation ([place]) (2010), and Pneumonia (3/29/2017and june 2017).   Significant Hospital Events: Including procedures, antibiotic start and stop dates in addition to other pertinent events   7/14 admit  Interim History / Subjective:    Objective   Blood pressure 100/65, pulse (!) 39, temperature 98.1 F (36.7 C), temperature  source Oral, resp. rate 13, height 5\' 10"  (1.778 m), weight 119.3 kg, SpO2 98 %.        Intake/Output Summary (Last 24 hours) at 01/14/2021 2226 Last data filed at 01/14/2021 1729 Gross per 24 hour  Intake 3050 ml  Output 200 ml  Net 2850 ml    Filed Weights   01/14/21 1509  Weight: 119.3 kg    Examination: General: Adult male of normal body habitus in NAD HENT: [place]/AT, PERRL, no JVD Lungs: Clear bilateral breath sounds.  Cardiovascular: IRIR, bradycardic in the 40s, no MRG Abdomen: Soft, non-tender, non-distended Extremities: No significant edema. Moving all 4 extremities.  Neuro: Somnolent, but arouses to voice and interacts appropriately.  GU: Voiding. 455mL clear yellow urine in my presence.   Resolved Hospital Problem list     Assessment & Plan:   Hypovolemic shock: improving. No clear infectious source, WBC 5, afebrile. Hypovolemic secondary to diuresis with significant weight loss. Improved with fluids. Lactic cleared with fluids. Norepinephrine being weaned off.  - Wean norepi to off - continue IVF hydration gently with respect to HFpEF.  - Defer antibiotics at this time - Holding home lasix, hydralazine, diltiazem, metolazone, metoprolol, valsartan.   Atrial fibrillation with slow ventricular rate: bradycardic to the 40s and 50s. Likely due to continued BB use in the setting of dehydration and renal failure. BP maintaining despite bradycardia as norepi being weaned off.  - Hold metoprolol - IVF - Telemetry monitoring - Continue warfarin, INR 2.5 on admit.   Acute renal failure: baseline creatinine seems to be around 1.24. 4.13 on presentation. Likely represents pre-renal azotemia in the setting of dehydration.  -  Renal ultrasound.  - Hold off on foley as he is voiding well on his own.  - Trend BMP  Hypokalemia Hypomagnesemia  - K and mag replaced in ED - Repeat labs  Hypertension HFpEF NYHA class 2  LVH  - holding outpatient meds as above - Telemetry  monitoring  Best Practice (right click and "Reselect all SmartList Selections" daily)   Diet/type: NPO w/ oral meds DVT prophylaxis: Coumadin GI prophylaxis: N/A Lines: N/A Foley:  N/A Code Status:  full code Last date of multidisciplinary goals of care discussion [ ]   Labs   CBC: Recent Labs  Lab 01/14/21 1519 01/14/21 1559  WBC 5.4  --   NEUTROABS 3.5  --   HGB 13.1 11.6*  HCT 40.1 34.0*  MCV 84.6  --   PLT 141*  --      Basic Metabolic Panel: Recent Labs  Lab 01/14/21 1519 01/14/21 1559  NA 135 140  K 3.1* 2.5*  CL 93*  --   CO2 31  --   GLUCOSE 136*  --   BUN 74*  --   CREATININE 4.13*  --   CALCIUM 8.9  --   MG 1.6*  --     GFR: Estimated Creatinine Clearance: 21.8 mL/min (A) (by C-G formula based on SCr of 4.13 mg/dL (H)). Recent Labs  Lab 01/14/21 1519 01/14/21 1546 01/14/21 1722  WBC 5.4  --   --   LATICACIDVEN  --  3.3* 2.0*     Liver Function Tests: Recent Labs  Lab 01/14/21 1519  AST 27  ALT 15  ALKPHOS 101  BILITOT 0.6  PROT 7.2  ALBUMIN 4.1    No results for input(s): LIPASE, AMYLASE in the last 168 hours. Recent Labs  Lab 01/14/21 1545  AMMONIA 35     ABG    Component Value Date/Time   PHART 7.352 08/07/2016 0315   PCO2ART 22.2 (L) 08/07/2016 0315   PO2ART 133 (H) 08/07/2016 0315   HCO3 27.4 01/14/2021 1559   TCO2 29 01/14/2021 1559   ACIDBASEDEF 11.7 (H) 08/07/2016 0315   O2SAT 87.0 01/14/2021 1559      Coagulation Profile: Recent Labs  Lab 01/14/21 1519  INR 2.5*     Cardiac Enzymes: No results for input(s): CKTOTAL, CKMB, CKMBINDEX, TROPONINI in the last 168 hours.  HbA1C: Hgb A1c MFr Bld  Date/Time Value Ref Range Status  04/07/2016 10:06 AM 4.8 4.8 - 5.6 % Final    Comment:    (NOTE)         Pre-diabetes: 5.7 - 6.4         Diabetes: >6.4         Glycemic control for adults with diabetes: <7.0     CBG: Recent Labs  Lab 01/14/21 1532 01/14/21 1711  GLUCAP 132* 135*     Review of  Systems:   Bolds are positive  Constitutional: weight loss, gain, night sweats, Fevers, chills, fatigue .  HEENT: headaches, Sore throat, sneezing, nasal congestion, post nasal drip, Difficulty swallowing, Tooth/dental problems, visual complaints visual changes, ear ache CV:  chest pain, radiates:,Orthopnea, PND, swelling in lower extremities**, dizziness, palpitations, syncope.  GI  heartburn, indigestion, abdominal pain, nausea, vomiting, diarrhea, change in bowel habits, loss of appetite, bloody stools.  Resp: cough, productive: , hemoptysis, dyspnea, chest pain, pleuritic.  Skin: rash or itching or icterus GU: dysuria, change in color of urine, urgency or frequency. flank pain, hematuria  MS: joint pain or swelling. decreased range of motion  Psych: change in mood or affect. depression or anxiety.  Neuro: difficulty with speech, weakness, numbness, ataxia    Past Medical History:  He,  has a past medical history of Anemia, Arthralgia of both knees, Arthritis, Atrial fibrillation ([place]), Cancer ([place]), Chronic anticoagulation (2010), Chronic diastolic CHF (congestive heart failure), NYHA class 2 ([place]), Dyspnea, Dysrhythmia, Hypertension, OSA (obstructive sleep apnea) (2016), Permanent atrial fibrillation ([place]) (2010), and Pneumonia (3/29/2017and june 2017).   Surgical History:   Past Surgical History:  Procedure Laterality Date   COLONOSCOPY WITH PROPOFOL N/A 03/04/2016   Procedure: COLONOSCOPY WITH PROPOFOL;  Surgeon: Carol Ada, MD;  Location: [facility];  Service: Endoscopy;  Laterality: N/A;   COLONOSCOPY WITH PROPOFOL N/A 09/15/2017   Procedure: COLONOSCOPY WITH PROPOFOL;  Surgeon: Carol Ada, MD;  Location: [facility];  Service: Endoscopy;  Laterality: N/A;   LAPAROSCOPIC PARTIAL COLECTOMY N/A 04/12/2016   Procedure: LAPAROSCOPIC ILEOCOLECTOMY;  Surgeon: [name], MD;  Location: [place];  Service: General;  Laterality: N/A;   PORT-A-CATH REMOVAL N/A 02/16/2017   Procedure:  REMOVAL PORT-A-CATH;  Surgeon: [name], MD;  Location: [place];  Service: General;  Laterality: N/A;   PORTACATH PLACEMENT N/A 05/18/2016   Procedure: INSERTION PORT-A-CATH;  Surgeon: [name], MD;  Location: [place];  Service: General;  Laterality: N/A;   THORACOTOMY Right 2010     Social History:   reports that he quit smoking about 46 years ago. His smoking use included cigarettes. He has a 0.20 pack-year smoking history. He has never used smokeless tobacco. He reports that he does not drink alcohol and does not use drugs.   Family History:  His family history includes Bone cancer in his maternal uncle; Cancer in his maternal uncle; Colon cancer in his sister and sister; Colon cancer (age of onset: 34) in his sister; Colon polyps in his brother, father, and mother; Dementia in his mother; Diabetes in his mother; Heart disease in his father; Heart failure in his father; Hyperlipidemia in his mother; Hypertension in his mother; Lung cancer in his paternal grandfather; Stroke in his maternal grandmother and sister. There is no history of Heart attack.   Allergies No Known Allergies   Home Medications  Prior to Admission medications   Medication Sig Start Date End Date Taking? Authorizing Provider  acetaminophen (TYLENOL) 500 MG tablet Take 1,000 mg by mouth 2 (two) times daily.    [provider]  albuterol (PROVENTIL HFA;VENTOLIN HFA) 108 (90 Base) MCG/ACT inhaler Inhale 2 puffs into the lungs every 6 (six) hours as needed for wheezing or shortness of breath. 10/01/15   Nita Sells, MD  aspirin EC 81 MG tablet Take 81 mg by mouth daily.    [provider]  colchicine 0.6 MG tablet Take 0.6 mg by mouth 2 (two) times daily.    [provider]  diltiazem (CARDIZEM) 120 MG tablet TAKE 1 TABLET BY MOUTH ONCE DAILY 12/13/17   Hilty, [name], MD  furosemide (LASIX) 80 MG tablet Take 1 tablet (80 mg total) by mouth 2 (two) times daily.  09/09/20   Hilty, [name], MD  hydrALAZINE (APRESOLINE) 100 MG tablet Take 1 tablet (100 mg total) by mouth 2 (two) times daily. 09/11/20   Hilty, [name], MD  HYDROcodone-acetaminophen (NORCO/VICODIN) 5-325 MG tablet Take 1 tablet by mouth every 4 (four) hours as needed. 08/15/20   Isla Pence, MD  metolazone (ZAROXOLYN) 5 MG tablet TAKE 1 TABLET BY MOUTH TWICE A WEEK ON  [facility]  AND  SATURDAYS 09/14/20   Hilty, [name], MD  metoprolol tartrate (LOPRESSOR) 25 MG tablet Take 1 tablet (25 mg total) by mouth 2 (two) times daily. 09/15/20   Hilty, [name], MD  MITIGARE 0.6 MG CAPS  11/07/19   [provider]  potassium chloride SA (KLOR-CON) 20 MEQ tablet TAKE 1  BY MOUTH ONCE DAILY 09/15/20   Hilty, [name], MD  predniSONE (STERAPRED UNI-PAK 21 TAB) 10 MG (21) TBPK tablet Take 6 tabs for 2 days, then 5 for 2 days, then 4 for 2 days, then 3 for 2 days, 2 for 2 days, then 1 for 2 days 08/15/20   Isla Pence, MD  Turmeric (QC TUMERIC COMPLEX PO) Take 1 tablet by mouth daily.    [provider]  valsartan (DIOVAN) 320 MG tablet Take 1 tablet (320 mg total) by mouth daily. 09/14/20   Pixie Casino, MD  warfarin (COUMADIN) 4 MG tablet TAKE 1 TO 2 TABLETS AS DIRECTED BY COUMADIN CLINIC 12/09/20   Hilty, [name], MD     Critical care time: 36 min     Georgann Housekeeper, AGACNP-BC [facility]  See Amion for personal pager PCCM on call pager [phone] until 7pm. Please call Elink 7p-7a. [phone]  01/14/2021 10:26 PM

## 2021-01-14 NOTE — ED Notes (Signed)
Provider updated about pt being almost done with IVF and on Levo with pressure being low and now pt was dipping into upper 30s for HR at this time. Provider stated to place pt on pacer pads as a precaution.

## 2021-01-14 NOTE — ED Notes (Signed)
Provided VBG results to MD Pfeiffer at approximately 1606.

## 2021-01-14 NOTE — ED Notes (Signed)
Per CC team instructions, Levo has been titrated down and is now discontinued. Pressures to be monitored and blood pressure changes to be reported.

## 2021-01-14 NOTE — ED Triage Notes (Signed)
Per pt and daughter pt with lightheaded x 5 days-seen by GI yesterday and was notified BP was low-pt NAD-unsteady gait to triage-placed in w/c

## 2021-01-15 DIAGNOSIS — G4733 Obstructive sleep apnea (adult) (pediatric): Secondary | ICD-10-CM

## 2021-01-15 DIAGNOSIS — E861 Hypovolemia: Principal | ICD-10-CM

## 2021-01-15 DIAGNOSIS — I5032 Chronic diastolic (congestive) heart failure: Secondary | ICD-10-CM

## 2021-01-15 DIAGNOSIS — N179 Acute kidney failure, unspecified: Secondary | ICD-10-CM

## 2021-01-15 LAB — LACTIC ACID, PLASMA
Lactic Acid, Venous: 3.3 mmol/L (ref 0.5–1.9)
Lactic Acid, Venous: 2.9 mmol/L (ref 0.5–1.9)
Lactic Acid, Venous: 1.6 mmol/L (ref 0.5–1.9)

## 2021-01-15 LAB — CBC
HCT: 36.7 % — ABNORMAL LOW (ref 39.0–52.0)
Hemoglobin: 11.8 g/dL — ABNORMAL LOW (ref 13.0–17.0)
MCH: 27.2 pg (ref 26.0–34.0)
MCHC: 32.2 g/dL (ref 30.0–36.0)
MCV: 84.6 fL (ref 80.0–100.0)
Platelets: 121 10*3/uL — ABNORMAL LOW (ref 150–400)
RBC: 4.34 MIL/uL (ref 4.22–5.81)
RDW: 15.8 % — ABNORMAL HIGH (ref 11.5–15.5)
WBC: 4.6 10*3/uL (ref 4.0–10.5)
nRBC: 0 % (ref 0.0–0.2)

## 2021-01-15 LAB — BASIC METABOLIC PANEL
Anion gap: 8 (ref 5–15)
BUN: 61 mg/dL — ABNORMAL HIGH (ref 8–23)
CO2: 30 mmol/L (ref 22–32)
Calcium: 9 mg/dL (ref 8.9–10.3)
Chloride: 97 mmol/L — ABNORMAL LOW (ref 98–111)
Creatinine, Ser: 2.57 mg/dL — ABNORMAL HIGH (ref 0.61–1.24)
GFR, Estimated: 26 mL/min — ABNORMAL LOW (ref >60)
Glucose, Bld: 82 mg/dL (ref 70–99)
Potassium: 3.4 mmol/L — ABNORMAL LOW (ref 3.5–5.1)
Sodium: 135 mmol/L (ref 135–145)

## 2021-01-15 LAB — GLUCOSE, CAPILLARY: Glucose-Capillary: 87 mg/dL (ref 70–99)

## 2021-01-15 LAB — MAGNESIUM: Magnesium: 1.8 mg/dL (ref 1.7–2.4)

## 2021-01-15 LAB — PHOSPHORUS: Phosphorus: 2.7 mg/dL (ref 2.5–4.6)

## 2021-01-15 LAB — HIV ANTIBODY (ROUTINE TESTING W REFLEX): HIV Screen 4th Generation wRfx: NONREACTIVE

## 2021-01-15 LAB — PROTIME-INR
INR: 2.6 — ABNORMAL HIGH (ref 0.8–1.2)
Prothrombin Time: 28.1 seconds — ABNORMAL HIGH (ref 11.4–15.2)

## 2021-01-15 MED ORDER — POTASSIUM CHLORIDE CRYS ER 20 MEQ PO TBCR
20.0000 meq | EXTENDED_RELEASE_TABLET | Freq: Once | ORAL | Status: DC
  Filled 2021-01-15: qty 1

## 2021-01-15 MED ORDER — ACETAMINOPHEN 500 MG PO TABS
1000.0000 mg | ORAL_TABLET | Freq: Once | ORAL | Status: AC
  Administered 2021-01-15: 1000 mg via ORAL
  Filled 2021-01-15: qty 2

## 2021-01-15 MED ORDER — ENSURE ENLIVE PO LIQD
237.0000 mL | ORAL | Status: DC
  Administered 2021-01-16 – 2021-01-17 (×2): 237 mL via ORAL

## 2021-01-15 MED ORDER — ENSURE ENLIVE PO LIQD
237.0000 mL | Freq: Two times a day (BID) | ORAL | Status: DC
  Administered 2021-01-15: 237 mL via ORAL

## 2021-01-15 MED ORDER — WARFARIN SODIUM 4 MG PO TABS
4.0000 mg | ORAL_TABLET | ORAL | Status: DC
  Administered 2021-01-15: 4 mg via ORAL
  Filled 2021-01-15: qty 1

## 2021-01-15 MED ORDER — SODIUM CHLORIDE 0.9 % IV BOLUS
500.0000 mL | Freq: Once | INTRAVENOUS | Status: AC
  Administered 2021-01-15: 500 mL via INTRAVENOUS

## 2021-01-15 MED ORDER — WARFARIN SODIUM 4 MG PO TABS
8.0000 mg | ORAL_TABLET | ORAL | Status: DC
  Filled 2021-01-15: qty 2

## 2021-01-15 MED ORDER — PROSOURCE PLUS PO LIQD
30.0000 mL | Freq: Two times a day (BID) | ORAL | Status: DC
  Administered 2021-01-15 – 2021-01-17 (×4): 30 mL via ORAL
  Filled 2021-01-15 (×4): qty 30

## 2021-01-15 NOTE — Progress Notes (Signed)
Initial Nutrition Assessment  DOCUMENTATION CODES:   Obesity unspecified  INTERVENTION:   -Ensure Enlive po daily, each supplement provides 350 kcal and 20 grams of protein  -Prosource Plus PO BID, each provides 100 kcals and 15g protein  NUTRITION DIAGNOSIS:   Increased nutrient needs related to chronic illness as evidenced by estimated needs.  GOAL:   Patient will meet greater than or equal to 90% of their needs  MONITOR:   PO intake, Supplement acceptance, Labs, Weight trends, I & O's  REASON FOR ASSESSMENT:   Malnutrition Screening Tool    ASSESSMENT:   70 year old male with PMH as below, which is significant for hypertension, atrial fibrillation on warfarin, HFpEF, and OSA. He presented to [place] on 7/14 with complaints of weakness x 3 days.  Patient with increased weakness r/t rapid diuresis. Pt reports 40 lbs of weight loss as a result. Per weight records, pt has lost 23 lbs since 05/11/20 (8% wt loss x 8 months, insignificant for time frame). Pt doesn't report any issues with appetite. Was eating/drinking without issue PTA.  Ensure supplements were ordered, will decrease to once daily. Will add Prosource for additional protein given increased needs.  Medications: Lactated ringers, Colace  Labs reviewed:  CBGs: 87 Low K  NUTRITION - FOCUSED PHYSICAL EXAM:  Unable to complete  Diet Order:   Diet Order             Diet Heart Room service appropriate? Yes; Fluid consistency: Thin  Diet effective now                   EDUCATION NEEDS:   No education needs have been identified at this time  Skin:  Skin Assessment: Reviewed RN Assessment  Last BM:  7/12  Height:   Ht Readings from Last 1 Encounters:  01/15/21 5\' 10"  (1.778 m)    Weight:   Wt Readings from Last 1 Encounters:  01/15/21 121.6 kg    BMI:  Body mass index is 38.47 kg/m.  Estimated Nutritional Needs:   Kcal:  2100-2300  Protein:  110-125g  Fluid:   2.1L/day  Clayton Bibles, MS, RD, LDN Inpatient Clinical Dietitian Contact information available via Amion

## 2021-01-15 NOTE — Progress Notes (Signed)
PROGRESS NOTE  Steven Barnett AXE:[id number] DOB: [birth year] DOA: 01/14/2021 PCP: Elisabeth Cara, PA-C  HPI/Recap of past 24 hours: Patient is a 70 year old male with past medical history of morbid obesity, stage IV chronic kidney disease, obstructive sleep apnea, chronic diastolic heart failure and atrial fibrillation who presented to [place] on 7/14 with complaints of weakness x3 days.  In the ED, patient noted to have acute kidney injury and with systolic blood pressure in the 70s and patient treated with aggressive IV fluids without improvement and so started on norepinephrine for pressor support.  Lactic acid level initially at 3.3 although thought to be from severe intravascular volume depletion.  No evidence of clear infectious source and so sepsis ruled out.  Underlying source thought to be from diuresis and antihypertensives which patient has been taking consistently over the past 3 months.  He states that he is lost 40 pounds as a result.  Seen by critical care transferred to [facility] and placed in the ICU.  At this point, much improved.  Lactic acid level by evening of 7/14 down to 2.0.  Pressors able to be weaned off and patient transferred to floor and to the hospitalist service.    Patient says he is feeling much better.  Repeat lactic acid level this morning up to 2.9.  Assessment/Plan: Active Problems:   Morbid obesity [facility]): Patient meets criteria BMI greater than 35 with comorbidities of heart failure and sleep apnea and chronic kidney disease    Obstructive sleep apnea   Atrial fibrillation [facility]): Therapeutic on Coumadin.  Has been bradycardic due to aggressive use of beta-blocker.  This has since been held and would restart very judiciously.    Chronic diastolic (congestive) heart failure (HCC) over diuresed which contributed to his hypotension.  Resolved and currently euvolemic.    Acute kidney injury ([place]) with chronic kidney disease stage IV: Close  to baseline.  Creatinine on 2/11 was 2.41, although previous creatinines last year was closer to 1.2-1.5 and stage IIIa.  Will review old records and see if patient has nephrologist.    Hypotension/hypovolemia: Stabilizing.  Off of pressor support.  Blood pressure still slightly soft.  Would favor continuing fluids and monitoring for another day.  Repeat lactic acid level higher this morning.  Again to reiterate, not sepsis.  Sepsis ruled out.  Lactic acidosis from severe intravascular volume depletion.  Continue lactated Ringer's at 200 cc an hour and will give normal saline bolus    Hypomagnesemia/hypokalemia: Replacing as needed    Normocytic anemia: Mild.  Secondary to chronic renal disease    Gait instability: Most likely from orthostatic hypotension.  PT and OT to see.  Morbid obesity: Meets criteria with BMI greater than 35+ chronic kidney disease and chronic heart failure   Code Status: Full code  Family Communication: Left message with family  Disposition Plan: Potential discharge tomorrow if intravascular volume replaced   Consultants: Critical care  Procedures: None  Antimicrobials: None  DVT prophylaxis: Coumadin  Level of care: Progressive   Objective: Vitals:   01/15/21 0334 01/15/21 0731  BP: 110/68 110/66  Pulse:  60  Resp: 15 18  Temp: 98 F (36.7 C) 98.4 F (36.9 C)  SpO2: 96% 95%    Intake/Output Summary (Last 24 hours) at 01/15/2021 0913 Last data filed at 01/15/2021 0700 Gross per 24 hour  Intake 3050 ml  Output 1575 ml  Net 1475 ml   Filed Weights   01/14/21 1509 01/15/21  0334  Weight: 119.3 kg 121.6 kg   Body mass index is 38.47 kg/m.  Exam:  General: Alert and oriented x3, no acute distress HEENT: Normocephalic atraumatic, mucous membranes are slightly dry Cardiovascular: Irregular rhythm, rate controlled Respiratory: Clear to auscultation bilaterally Abdomen: Soft, nontender, nondistended, positive bowel sounds Musculoskeletal:  No clubbing or cyanosis or edema Skin: No skin breaks, tears or lesions Psychiatry: Appropriate, no evidence of psychoses Neurology: No focal deficits   Data Reviewed: CBC: Recent Labs  Lab 01/14/21 1519 01/14/21 1559 01/15/21 0435  WBC 5.4  --  4.6  NEUTROABS 3.5  --   --   HGB 13.1 11.6* 11.8*  HCT 40.1 34.0* 36.7*  MCV 84.6  --  84.6  PLT 141*  --  510*   Basic Metabolic Panel: Recent Labs  Lab 01/14/21 1519 01/14/21 1559 01/14/21 2157 01/15/21 0435  NA 135 140 133* 135  K 3.1* 2.5* 3.4* 3.4*  CL 93*  --  94* 97*  CO2 31  --  29 30  GLUCOSE 136*  --  93 82  BUN 74*  --  64* 61*  CREATININE 4.13*  --  3.06* 2.57*  CALCIUM 8.9  --  8.7* 9.0  MG 1.6*  --  2.0 1.8  PHOS  --   --   --  2.7   GFR: Estimated Creatinine Clearance: 35.5 mL/min (A) (by C-G formula based on SCr of 2.57 mg/dL (H)). Liver Function Tests: Recent Labs  Lab 01/14/21 1519  AST 27  ALT 15  ALKPHOS 101  BILITOT 0.6  PROT 7.2  ALBUMIN 4.1   No results for input(s): LIPASE, AMYLASE in the last 168 hours. Recent Labs  Lab 01/14/21 1545  AMMONIA 35   Coagulation Profile: Recent Labs  Lab 01/14/21 1519 01/15/21 0435  INR 2.5* 2.6*   Cardiac Enzymes: No results for input(s): CKTOTAL, CKMB, CKMBINDEX, TROPONINI in the last 168 hours. BNP (last 3 results) No results for input(s): PROBNP in the last 8760 hours. HbA1C: No results for input(s): HGBA1C in the last 72 hours. CBG: Recent Labs  Lab 01/14/21 1532 01/14/21 1711 01/15/21 0734  GLUCAP 132* 135* 87   Lipid Profile: No results for input(s): CHOL, HDL, LDLCALC, TRIG, CHOLHDL, LDLDIRECT in the last 72 hours. Thyroid Function Tests: Recent Labs    01/14/21 1545  TSH 0.822   Anemia Panel: No results for input(s): VITAMINB12, FOLATE, FERRITIN, TIBC, IRON, RETICCTPCT in the last 72 hours. Urine analysis:    Component Value Date/Time   COLORURINE YELLOW 01/14/2021 1534   APPEARANCEUR CLEAR 01/14/2021 1534   LABSPEC  1.010 01/14/2021 1534   PHURINE 5.5 01/14/2021 1534   GLUCOSEU NEGATIVE 01/14/2021 1534   HGBUR NEGATIVE 01/14/2021 1534   BILIRUBINUR NEGATIVE 01/14/2021 1534   KETONESUR NEGATIVE 01/14/2021 1534   PROTEINUR NEGATIVE 01/14/2021 1534   UROBILINOGEN 2.0 (H) 08/14/2020 1735   NITRITE NEGATIVE 01/14/2021 1534   LEUKOCYTESUR NEGATIVE 01/14/2021 1534   Sepsis Labs: @LABRCNTIP (procalcitonin:4,lacticidven:4)  ) Recent Results (from the past 240 hour(s))  Culture, blood (routine x 2)     Status: None (Preliminary result)   Collection Time: 01/14/21  3:40 PM   Specimen: BLOOD LEFT FOREARM  Result Value Ref Range Status   Specimen Description   Final    BLOOD LEFT FOREARM Performed at [facility], [address] [place], [place] [address]    Special Requests   Final    BOTTLES DRAWN AEROBIC AND ANAEROBIC Blood Culture adequate volume Performed at  [facility], [address] [place], [place] [address]    Culture   Final    NO GROWTH < 24 HOURS Performed at [facility], [place] [address] [place], [place] [address]    Report Status PENDING  Incomplete  Resp Panel by RT-PCR (Flu A&B, Covid) Nasopharyngeal Swab     Status: None   Collection Time: 01/14/21  3:45 PM   Specimen: Nasopharyngeal Swab; Nasopharyngeal(NP) swabs in vial transport medium  Result Value Ref Range Status   SARS Coronavirus 2 by RT PCR NEGATIVE NEGATIVE Final    Comment: (NOTE) SARS-CoV-2 target nucleic acids are NOT DETECTED.  The SARS-CoV-2 RNA is generally detectable in upper respiratory specimens during the acute phase of infection. The lowest concentration of SARS-CoV-2 viral copies this assay can detect is 138 copies/mL. A negative result does not preclude SARS-Cov-2 infection and should not be used as the sole basis for treatment or other patient management decisions. A negative result may occur with  improper specimen collection/handling, submission of specimen other than  nasopharyngeal swab, presence of viral mutation(s) within the areas targeted by this assay, and inadequate number of viral copies(<138 copies/mL). A negative result must be combined with clinical observations, patient history, and epidemiological information. The expected result is Negative.  Fact Sheet for Patients:  EntrepreneurPulse.com.au  Fact Sheet for Healthcare Providers:  IncredibleEmployment.be  This test is no t yet approved or cleared by the [place] FDA and  has been authorized for detection and/or diagnosis of SARS-CoV-2 by FDA under an Emergency Use Authorization (EUA). This EUA will remain  in effect (meaning this test can be used) for the duration of the COVID-19 declaration under Section 564(b)(1) of the Act, 21 U.S.C.section 360bbb-3(b)(1), unless the authorization is terminated  or revoked sooner.       Influenza A by PCR NEGATIVE NEGATIVE Final   Influenza B by PCR NEGATIVE NEGATIVE Final    Comment: (NOTE) The Xpert Xpress SARS-CoV-2/FLU/RSV plus assay is intended as an aid in the diagnosis of influenza from Nasopharyngeal swab specimens and should not be used as a sole basis for treatment. Nasal washings and aspirates are unacceptable for Xpert Xpress SARS-CoV-2/FLU/RSV testing.  Fact Sheet for Patients: EntrepreneurPulse.com.au  Fact Sheet for Healthcare Providers: IncredibleEmployment.be  This test is not yet approved or cleared by the [place] FDA and has been authorized for detection and/or diagnosis of SARS-CoV-2 by FDA under an Emergency Use Authorization (EUA). This EUA will remain in effect (meaning this test can be used) for the duration of the COVID-19 declaration under Section 564(b)(1) of the Act, 21 U.S.C. section 360bbb-3(b)(1), unless the authorization is terminated or revoked.  Performed at [facility], [address] [place], [place]  [address]   Culture, blood (routine x 2)     Status: None (Preliminary result)   Collection Time: 01/14/21  3:46 PM   Specimen: Right Antecubital; Blood  Result Value Ref Range Status   Specimen Description   Final    RIGHT ANTECUBITAL Performed at [facility], [address] [place], [place] [address]    Special Requests   Final    BOTTLES DRAWN AEROBIC AND ANAEROBIC Blood Culture adequate volume Performed at [facility], [address] [place], [place] [address]    Culture   Final    NO GROWTH < 24 HOURS Performed at [facility], [place] [address] [place], [place] [address]  Report Status PENDING  Incomplete      Studies: CT HEAD WO CONTRAST  Result Date: 01/14/2021 CLINICAL DATA:  Dizziness EXAM: CT HEAD WITHOUT CONTRAST TECHNIQUE: Contiguous axial images were obtained from the base of the skull through the vertex without intravenous contrast. COMPARISON:  None. FINDINGS: Brain: No evidence of acute infarction, hemorrhage, hydrocephalus, extra-axial collection or mass lesion/mass effect. Chronic atrophic changes and chronic white matter ischemic changes are seen. Lacunar infarcts are noted along the superior aspect of the basal ganglia bilaterally. Vascular: No hyperdense vessel or unexpected calcification. Skull: Normal. Negative for fracture or focal lesion. Sinuses/Orbits: No acute finding. Other: None. IMPRESSION: Chronic atrophic changes and ischemic changes without acute abnormality. Electronically Signed   By: Inez Catalina M.D.   On: 01/14/2021 23:51   US RENAL  Result Date: 01/14/2021 CLINICAL DATA:  Acute renal failure EXAM: RENAL / URINARY TRACT ULTRASOUND COMPLETE COMPARISON:  None. FINDINGS: Right Kidney: Renal measurements: 9.3 x 5.5 x 5.0 cm. = volume: 132 mL. Echogenicity within normal limits. No mass or hydronephrosis visualized. Left Kidney: Renal measurements: 10.8 x 6.0 x 5.8 cm. = volume: 195 mL. Echogenicity within normal limits. No  mass or hydronephrosis visualized. Bladder: Appears normal for degree of bladder distention. Other: None. IMPRESSION: No acute abnormality noted. Electronically Signed   By: Inez Catalina M.D.   On: 01/14/2021 22:42   DG Chest Port 1 View  Result Date: 01/14/2021 CLINICAL DATA:  Lightheadedness hypotension. EXAM: PORTABLE CHEST 1 VIEW COMPARISON:  CT chest dated January 28, 2019. Chest x-ray dated August 19, 2016. FINDINGS: Stable mild cardiomegaly. Normal pulmonary vascularity. Unchanged chronic scarring at the right lung base and costophrenic angle. No focal consolidation, pleural effusion, or pneumothorax. No acute osseous abnormality. IMPRESSION: No active disease. Electronically Signed   By: Titus Dubin M.D.   On: 01/14/2021 16:31    Scheduled Meds:  aspirin EC  81 mg Oral Daily   feeding supplement  237 mL Oral BID BM   potassium chloride  20 mEq Oral Once   [START ON 01/16/2021] warfarin  8 mg Oral Once per day on Sun Tue Thu Sat   And   warfarin  4 mg Oral Once per day on Mon Wed Fri   Warfarin - Pharmacist Dosing Inpatient   Does not apply q1600    Continuous Infusions:  lactated ringers 200 mL/hr at 01/15/21 0727     LOS: 1 day     Annita Brod, MD [facility]   01/15/2021, 9:13 AM

## 2021-01-15 NOTE — Plan of Care (Signed)
Updated on plan of care at this time and breakfast ordered. Pt asking for his home dose of tylenol. Provider made aware. Will continue to monitor at this time.  Problem: Education: Goal: Knowledge of General Education information will improve Description: Including pain rating scale, medication(s)/side effects and non-pharmacologic comfort measures Outcome: Progressing   Problem: Health Behavior/Discharge Planning: Goal: Ability to manage health-related needs will improve Outcome: Progressing   Problem: Clinical Measurements: Goal: Ability to maintain clinical measurements within normal limits will improve Outcome: Progressing Goal: Will remain free from infection Outcome: Progressing Goal: Diagnostic test results will improve Outcome: Progressing Goal: Respiratory complications will improve Outcome: Progressing Goal: Cardiovascular complication will be avoided Outcome: Progressing   Problem: Activity: Goal: Risk for activity intolerance will decrease Outcome: Progressing   Problem: Coping: Goal: Level of anxiety will decrease Outcome: Progressing   Problem: Elimination: Goal: Will not experience complications related to bowel motility Outcome: Progressing Goal: Will not experience complications related to urinary retention Outcome: Progressing   Problem: Pain Managment: Goal: General experience of comfort will improve Outcome: Progressing   Problem: Safety: Goal: Ability to remain free from injury will improve Outcome: Progressing   Problem: Skin Integrity: Goal: Risk for impaired skin integrity will decrease Outcome: Progressing

## 2021-01-15 NOTE — Progress Notes (Signed)
ANTICOAGULATION CONSULT NOTE - Initial Consult  Pharmacy Consult for Warfarin Indication: atrial fibrillation  No Known Allergies  Patient Measurements: Height: 5\' 10"  (177.8 cm) Weight: 121.6 kg (268 lb 1.3 oz) IBW/kg (Calculated) : 73  Vital Signs: Temp: 98.4 F (36.9 C) (07/15 0731) Temp Source: Oral (07/15 0731) BP: 110/66 (07/15 0731) Pulse Rate: 60 (07/15 0731)  Labs: Recent Labs    01/14/21 1519 01/14/21 1559 01/14/21 1722 01/14/21 2157 01/15/21 0435  HGB 13.1 11.6*  --   --  11.8*  HCT 40.1 34.0*  --   --  36.7*  PLT 141*  --   --   --  121*  LABPROT 26.6*  --   --   --  28.1*  INR 2.5*  --   --   --  2.6*  CREATININE 4.13*  --   --  3.06* 2.57*  TROPONINIHS 13  --  9  --   --      Estimated Creatinine Clearance: 35.5 mL/min (A) (by C-G formula based on SCr of 2.57 mg/dL (H)).   Medical History: Past Medical History:  Diagnosis Date   Anemia    Arthralgia of both knees    Arthritis    "right knee" (10/01/2015)   Atrial fibrillation (HCC)    Cancer (HCC)    STAGE 1 COLON CANCER   Chronic anticoagulation 2010   Coumadin   Chronic diastolic CHF (congestive heart failure), NYHA class 2 ([place])    Dyspnea    Dysrhythmia    Hypertension    OSA (obstructive sleep apnea) 2016   "couldn't take the mask during the testing" (10/01/2015)   Permanent atrial fibrillation ([place]) 2010   Pneumonia 3/29/2017and june 2017    Medications:  Medications Prior to Admission  Medication Sig Dispense Refill Last Dose   acetaminophen (TYLENOL) 500 MG tablet Take 1,000 mg by mouth 2 (two) times daily.   01/14/2021   albuterol (PROVENTIL HFA;VENTOLIN HFA) 108 (90 Base) MCG/ACT inhaler Inhale 2 puffs into the lungs every 6 (six) hours as needed for wheezing or shortness of breath. 1 Inhaler 2 Past Month   aspirin EC 81 MG tablet Take 81 mg by mouth daily.   01/14/2021   diltiazem (CARDIZEM) 120 MG tablet TAKE 1 TABLET BY MOUTH ONCE DAILY 30 tablet 6 01/14/2021   furosemide  (LASIX) 80 MG tablet Take 1 tablet (80 mg total) by mouth 2 (two) times daily. 180 tablet 3 01/14/2021   hydrALAZINE (APRESOLINE) 100 MG tablet Take 1 tablet (100 mg total) by mouth 2 (two) times daily. 180 tablet 3 01/14/2021   metolazone (ZAROXOLYN) 5 MG tablet TAKE 1 TABLET BY MOUTH TWICE A WEEK ON  WEDNESDAYS  AND  SATURDAYS 15 tablet 6 01/13/2021   metoprolol tartrate (LOPRESSOR) 25 MG tablet Take 1 tablet (25 mg total) by mouth 2 (two) times daily. 180 tablet 3 01/14/2021 at 0800   MITIGARE 0.6 MG CAPS Take 1 capsule by mouth 2 (two) times daily.   01/14/2021   potassium chloride SA (KLOR-CON) 20 MEQ tablet TAKE 1  BY MOUTH ONCE DAILY (Patient taking differently: Take 20 mEq by mouth daily.) 90 tablet 3 01/14/2021   Turmeric (QC TUMERIC COMPLEX PO) Take 1 tablet by mouth daily.   01/14/2021   valsartan (DIOVAN) 320 MG tablet Take 1 tablet (320 mg total) by mouth daily. 90 tablet 3 01/14/2021   warfarin (COUMADIN) 4 MG tablet TAKE 1 TO 2 TABLETS AS DIRECTED BY COUMADIN CLINIC (Patient taking differently: Take 4-8  mg by mouth See admin instructions. Take 4 mg tablet by mouth on Monday, Wednesday and Fridays, then take two of the 4 mg tablets to make total of 8 mg by mouth all other days per patient) 60 tablet 1    Scheduled:   aspirin EC  81 mg Oral Daily   feeding supplement  237 mL Oral BID BM   potassium chloride  20 mEq Oral Once   [START ON 01/16/2021] warfarin  8 mg Oral Once per day on Sun Tue Thu Sat   And   warfarin  4 mg Oral Once per day on Mon Wed Fri   Warfarin - Pharmacist Dosing Inpatient   Does not apply q1600   Infusions:   lactated ringers 200 mL/hr at 01/15/21 1829    Assessment: 89 yom with PMH of hypertension, atrial fibrillation on warfarin, HFpEF, and OSA. Patient being evaluated for hypovolemic shock secondary to diuresis and significant weight loss.  Patient takes 4 mg tablet by mouth on Monday, Wednesday and Fridays, then take two of the 4 mg tablets to make total of 8  mg by mouth all other days per patient. Last taken 7/13 per patient.  INR stable at 2.6 today. We will resume his home dose and will space out INR check if stable in AM.   Goal of Therapy:  INR 2-3 Monitor platelets by anticoagulation protocol: Yes   Plan:  Resume coumadin 8mg  PO qday except 4mg  MWF Daily INR for now  Onnie Boer, PharmD, BCIDP, AAHIVP, CPP Infectious Disease Pharmacist 01/15/2021 8:44 AM

## 2021-01-15 NOTE — Telephone Encounter (Signed)
Will hold off on clearance - patient currently hospitalized with AKI

## 2021-01-16 DIAGNOSIS — I959 Hypotension, unspecified: Secondary | ICD-10-CM

## 2021-01-16 LAB — BASIC METABOLIC PANEL
Anion gap: 8 (ref 5–15)
BUN: 41 mg/dL — ABNORMAL HIGH (ref 8–23)
CO2: 28 mmol/L (ref 22–32)
Calcium: 8.7 mg/dL — ABNORMAL LOW (ref 8.9–10.3)
Chloride: 98 mmol/L (ref 98–111)
Creatinine, Ser: 1.32 mg/dL — ABNORMAL HIGH (ref 0.61–1.24)
GFR, Estimated: 58 mL/min — ABNORMAL LOW (ref >60)
Glucose, Bld: 70 mg/dL (ref 70–99)
Potassium: 3.8 mmol/L (ref 3.5–5.1)
Sodium: 134 mmol/L — ABNORMAL LOW (ref 135–145)

## 2021-01-16 LAB — PROTIME-INR
INR: 3.2 — ABNORMAL HIGH (ref 0.8–1.2)
Prothrombin Time: 32.4 seconds — ABNORMAL HIGH (ref 11.4–15.2)

## 2021-01-16 LAB — LACTIC ACID, PLASMA: Lactic Acid, Venous: 2 mmol/L (ref 0.5–1.9)

## 2021-01-16 MED ORDER — DILTIAZEM HCL 60 MG PO TABS
60.0000 mg | ORAL_TABLET | Freq: Three times a day (TID) | ORAL | Status: DC
  Administered 2021-01-16 – 2021-01-17 (×3): 60 mg via ORAL
  Filled 2021-01-16 (×3): qty 1

## 2021-01-16 MED ORDER — METOPROLOL TARTRATE 25 MG PO TABS
25.0000 mg | ORAL_TABLET | Freq: Two times a day (BID) | ORAL | Status: DC
  Administered 2021-01-16 (×2): 25 mg via ORAL
  Filled 2021-01-16 (×2): qty 1

## 2021-01-16 MED ORDER — WARFARIN SODIUM 2 MG PO TABS
2.0000 mg | ORAL_TABLET | Freq: Once | ORAL | Status: AC
  Administered 2021-01-16: 2 mg via ORAL
  Filled 2021-01-16: qty 1

## 2021-01-16 MED ORDER — PANTOPRAZOLE SODIUM 40 MG PO TBEC: 40.0000 mg | DELAYED_RELEASE_TABLET | Freq: Every day | ORAL | 0 refills | Status: DC

## 2021-01-16 MED ORDER — LACTATED RINGERS IV SOLN: INTRAVENOUS | Status: AC

## 2021-01-16 NOTE — Progress Notes (Signed)
ANTICOAGULATION CONSULT NOTE - Initial Consult  Pharmacy Consult for Warfarin Indication: atrial fibrillation  No Known Allergies  Patient Measurements: Height: 5\' 10"  (177.8 cm) Weight: 122.2 kg (269 lb 6.4 oz) IBW/kg (Calculated) : 73  Vital Signs: Temp: 98.8 F (37.1 C) (07/16 1105) Temp Source: Oral (07/16 1105) BP: 113/71 (07/16 1105) Pulse Rate: 71 (07/16 1105)  Labs: Recent Labs    01/14/21 1519 01/14/21 1559 01/14/21 1722 01/14/21 2157 01/15/21 0435 01/16/21 0048  HGB 13.1 11.6*  --   --  11.8*  --   HCT 40.1 34.0*  --   --  36.7*  --   PLT 141*  --   --   --  121*  --   LABPROT 26.6*  --   --   --  28.1* 32.4*  INR 2.5*  --   --   --  2.6* 3.2*  CREATININE 4.13*  --   --  3.06* 2.57* 1.32*  TROPONINIHS 13  --  9  --   --   --      Estimated Creatinine Clearance: 69.3 mL/min (A) (by C-G formula based on SCr of 1.32 mg/dL (H)).   Medical History: Past Medical History:  Diagnosis Date   Anemia    Arthralgia of both knees    Arthritis    "right knee" (10/01/2015)   Atrial fibrillation (HCC)    Cancer (HCC)    STAGE 1 COLON CANCER   Chronic anticoagulation 2010   Coumadin   Chronic diastolic CHF (congestive heart failure), NYHA class 2 ([place])    Dyspnea    Dysrhythmia    Hypertension    OSA (obstructive sleep apnea) 2016   "couldn't take the mask during the testing" (10/01/2015)   Permanent atrial fibrillation ([place]) 2010   Pneumonia 3/29/2017and june 2017    Medications:  Medications Prior to Admission  Medication Sig Dispense Refill Last Dose   acetaminophen (TYLENOL) 500 MG tablet Take 1,000 mg by mouth 2 (two) times daily.   01/14/2021   albuterol (PROVENTIL HFA;VENTOLIN HFA) 108 (90 Base) MCG/ACT inhaler Inhale 2 puffs into the lungs every 6 (six) hours as needed for wheezing or shortness of breath. 1 Inhaler 2 Past Month   aspirin EC 81 MG tablet Take 81 mg by mouth daily.   01/14/2021   diltiazem (CARDIZEM) 120 MG tablet TAKE 1 TABLET BY MOUTH  ONCE DAILY 30 tablet 6 01/14/2021   furosemide (LASIX) 80 MG tablet Take 1 tablet (80 mg total) by mouth 2 (two) times daily. 180 tablet 3 01/14/2021   hydrALAZINE (APRESOLINE) 100 MG tablet Take 1 tablet (100 mg total) by mouth 2 (two) times daily. 180 tablet 3 01/14/2021   metolazone (ZAROXOLYN) 5 MG tablet TAKE 1 TABLET BY MOUTH TWICE A WEEK ON  WEDNESDAYS  AND  SATURDAYS 15 tablet 6 01/13/2021   metoprolol tartrate (LOPRESSOR) 25 MG tablet Take 1 tablet (25 mg total) by mouth 2 (two) times daily. 180 tablet 3 01/14/2021 at 0800   MITIGARE 0.6 MG CAPS Take 1 capsule by mouth 2 (two) times daily.   01/14/2021   potassium chloride SA (KLOR-CON) 20 MEQ tablet TAKE 1  BY MOUTH ONCE DAILY (Patient taking differently: Take 20 mEq by mouth daily.) 90 tablet 3 01/14/2021   Turmeric (QC TUMERIC COMPLEX PO) Take 1 tablet by mouth daily.   01/14/2021   valsartan (DIOVAN) 320 MG tablet Take 1 tablet (320 mg total) by mouth daily. 90 tablet 3 01/14/2021   warfarin (COUMADIN) 4  MG tablet TAKE 1 TO 2 TABLETS AS DIRECTED BY COUMADIN CLINIC 60 tablet 1    Scheduled:   (feeding supplement) PROSource Plus  30 mL Oral BID BM   aspirin EC  81 mg Oral Daily   diltiazem  60 mg Oral Q8H   feeding supplement  237 mL Oral Q24H   metoprolol tartrate  25 mg Oral BID   potassium chloride  20 mEq Oral Once   warfarin  8 mg Oral Once per day on Sun Tue Thu Sat   And   warfarin  4 mg Oral Once per day on Mon Wed Fri   Warfarin - Pharmacist Dosing Inpatient   Does not apply q1600   Infusions:   lactated ringers 50 mL/hr at 01/16/21 6438    Assessment: 17 yom with PMH of hypertension, atrial fibrillation on warfarin, HFpEF, and OSA. Patient being evaluated for hypovolemic shock secondary to diuresis and significant weight loss.  Patient takes 4 mg tablet by mouth on Monday, Wednesday and Fridays, then take two of the 4 mg tablets to make total of 8 mg by mouth all other days per patient. Last taken 7/13 per patient.  INR  elevated at 3.2 today. Give dose of 2mg  today, then dose based on INR tomorrow.   Goal of Therapy:  INR 2-3 Monitor platelets by anticoagulation protocol: Yes   Plan:  Give dose of Coumadin 2mg  today.  Daily INR for now  Donald Pore, PharmD Pharmacy Resident 01/16/2021, 12:07 PM

## 2021-01-16 NOTE — Discharge Instructions (Addendum)
Follow with Primary MD Steven Barnett, Steven E, PA-C in 1-2 days   Get CBC, CMP, INR, Magnesium, 2 view Chest X ray -  checked next visit within 1-2 days by Primary MD    Activity: As tolerated with Full fall precautions use walker/cane & assistance as needed  Disposition Home    Diet: Heart Healthy   Special Instructions: If you have smoked or chewed Tobacco  in the last 2 yrs please stop smoking, stop any regular Alcohol  and or any Recreational drug use.  On your next visit with your primary care physician please Get Medicines reviewed and adjusted.  Please request your Prim.MD to go over all Hospital Tests and Procedure/Radiological results at the follow up, please get all Hospital records sent to your Prim MD by signing hospital release before you go home.  If you experience worsening of your admission symptoms, develop shortness of breath, life threatening emergency, suicidal or homicidal thoughts you must seek medical attention immediately by calling 911 or calling your MD immediately  if symptoms less severe.  You Must read complete instructions/literature along with all the possible adverse reactions/side effects for all the Medicines you take and that have been prescribed to you. Take any new Medicines after you have completely understood and accpet all the possible adverse reactions/side effects.

## 2021-01-16 NOTE — Progress Notes (Signed)
Patient refusing to stay in the hospital and is wanting to leave. Patient educated. Dr.Singh made aware.

## 2021-01-16 NOTE — Discharge Summary (Addendum)
Steven Barnett JWJ:[id number] DOB: 1950-08-11 DOA: 01/14/2021  PCP: Elisabeth Cara, PA-C  Admit date: 01/14/2021  Discharge date: 01/17/2021  Admitted From: Home   Disposition:  Home   Recommendations for Outpatient Follow-up:   Follow up with PCP in 1-2 weeks  PCP Please obtain BMP/CBC, 2 view CXR in 1week,  (see Discharge instructions)   PCP Please follow up on the following pending results: Kindly monitor blood pressure, CBC, CMP, magnesium, INR, 2 view chest x-ray in 1-2 days.  Adjust blood pressure medications and diuretics as needed.      Home Health: RN, PT Equipment/Devices: Cane  Consultations: None  Discharge Condition: Stable    CODE STATUS: Full    Diet Recommendation: Heart Healthy   Diet Order             Diet - low sodium heart healthy           Diet Heart Room service appropriate? Yes; Fluid consistency: Thin  Diet effective now                    Chief Complaint  Patient presents with   Dizziness     Brief history of present illness from the day of admission and additional interim summary    Patient is a 70 year old male with past medical history of morbid obesity, stage IV chronic kidney disease, obstructive sleep apnea, chronic diastolic heart failure and atrial fibrillation who presented to [place] on 7/14 with complaints of weakness x3 days, he also reported a 40 pound weight loss in the last few weeks, in the ER he was diagnosed with severe dehydration, hypovolemia, AKI and hypotension and admitted to the hospital.                                                                 Hospital Course    Severe dehydration causing hypotension, hypovolemia, elevation of lactic acid due to hypoperfusion along with AKI - caused by side effects of multiple blood  pressure medications and high-dose diuretics, also claims unintentional 40 pound weight loss over the last several months.  He had no signs of sepsis.  With aggressive IV fluids and hydration blood pressure has improved, AKI has resolved, he feels much better.  He is currently only on 120 mg of diltiazem, blood pressure good, all his other blood pressure medications and high-dose diuretics have been held.  Upon discharge I will place him on 120 mg of diltiazem along with low-dose Lasix and potassium.  I think as his body recovers he might require up titration of blood pressure medications and diuretics but I do not think he would require the dosages that he was on previously.  I have requested him and his daughter to follow with his PCP tomorrow and  again with primary care physician in 5 to 7 days for further monitoring and titration.  His acute renal failure has almost completely resolved we will request PCP to check renal function again in 1 to 2 days.   Morbid obesity [facility]): Patient meets criteria BMI greater than 35 -follow with PCP for weight loss. OSA.  Supportive care.  Management per PCP.   Proximal atrial fibrillation.  Mali vas score is score of greater than 3.  Currently blood pressure has improved somewhat hence will resume his home dose Cardizem, continue Coumadin.  Patient to follow-up with PCP for INR monitoring and titration of Coumadin as needed.   Chronic diastolic (congestive) heart failure (HCC) EF 60% on echocardiogram done last year.  Dehydrated upon admission.  Currently close to euvolemic.  Being discharged on Cardizem.  Again request PCP to monitor him in 2 days and adjust blood pressure medications and diuretics upon that visit.   Hypomagnesemia and hypokalemia.  Replaced.  Request PCP to recheck next visit.   Normocytic anemia: Mild.  Follow with PCP for age-appropriate outpatient anemia work-up.   Gait instability: Most likely from orthostatic hypotension.  PT OT will get  home PT and cane.   Discharge diagnosis     Active Problems:   Morbid obesity ([place])   Obstructive sleep apnea   Atrial fibrillation (HCC)   Chronic diastolic (congestive) heart failure (HCC)   Acute kidney injury ([place])   Hypotension   Hypovolemia   Hypokalemia   Hypomagnesemia   Normocytic anemia   Gait instability    Discharge instructions    Discharge Instructions     Diet - low sodium heart healthy   Complete by: As directed    Discharge instructions   Complete by: As directed    Follow with Primary MD [name], [place] E, PA-C in 1-2 days   Get CBC, CMP, INR, Magnesium, 2 view Chest X ray -  checked next visit within 1-2 days by Primary MD    Activity: As tolerated with Full fall precautions use walker/cane & assistance as needed  Disposition Home    Diet: Heart Healthy   Special Instructions: If you have smoked or chewed Tobacco  in the last 2 yrs please stop smoking, stop any regular Alcohol  and or any Recreational drug use.  On your next visit with your primary care physician please Get Medicines reviewed and adjusted.  Please request your Prim.MD to go over all Hospital Tests and Procedure/Radiological results at the follow up, please get all Hospital records sent to your Prim MD by signing hospital release before you go home.  If you experience worsening of your admission symptoms, develop shortness of breath, life threatening emergency, suicidal or homicidal thoughts you must seek medical attention immediately by calling 911 or calling your MD immediately  if symptoms less severe.  You Must read complete instructions/literature along with all the possible adverse reactions/side effects for all the Medicines you take and that have been prescribed to you. Take any new Medicines after you have completely understood and accpet all the possible adverse reactions/side effects.   Increase activity slowly   Complete by: As directed        Discharge  Medications   Allergies as of 01/17/2021   No Known Allergies      Medication List     STOP taking these medications    hydrALAZINE 100 MG tablet Commonly known as: APRESOLINE   metolazone 5 MG tablet Commonly known as: ZAROXOLYN  metoprolol tartrate 25 MG tablet Commonly known as: LOPRESSOR   valsartan 320 MG tablet Commonly known as: DIOVAN       TAKE these medications    acetaminophen 500 MG tablet Commonly known as: TYLENOL Take 1,000 mg by mouth 2 (two) times daily.   albuterol 108 (90 Base) MCG/ACT inhaler Commonly known as: VENTOLIN HFA Inhale 2 puffs into the lungs every 6 (six) hours as needed for wheezing or shortness of breath.   aspirin EC 81 MG tablet Take 81 mg by mouth daily.   diltiazem 120 MG tablet Commonly known as: CARDIZEM TAKE 1 TABLET BY MOUTH ONCE DAILY   furosemide 40 MG tablet Commonly known as: LASIX Take 1 tablet (40 mg total) by mouth once for 1 dose. What changed:  medication strength how much to take when to take this   Mitigare 0.6 MG Caps Generic drug: Colchicine Take 1 capsule by mouth 2 (two) times daily.   pantoprazole 40 MG tablet Commonly known as: Protonix Take 1 tablet (40 mg total) by mouth daily.   potassium chloride 10 MEQ tablet Commonly known as: KLOR-CON Take 1 tablet (10 mEq total) by mouth daily. What changed:  medication strength how much to take how to take this when to take this additional instructions   QC TUMERIC COMPLEX PO Take 1 tablet by mouth daily.   warfarin 4 MG tablet Commonly known as: COUMADIN Take as directed. If you are unsure how to take this medication, talk to your nurse or doctor. Original instructions: TAKE 1 TO 2 TABLETS AS DIRECTED BY COUMADIN CLINIC What changed: See the new instructions.               Durable Medical Equipment  (From admission, onward)           Start     Ordered   01/16/21 0921  For home use only DME Cane  Once        01/16/21 0920              Follow-up Information     [place], [place], [place]. Schedule an appointment as soon as possible for a visit in 1 day(s).   Specialty: Family Medicine Contact information: [address] [place] [address] [phone]         Pixie Casino, MD. Schedule an appointment as soon as possible for a visit in 2 day(s).   Specialty: Cardiology Contact information: [address] [place] [address] [phone]                 Major procedures and Radiology Reports - PLEASE review detailed and final reports thoroughly  -       CT HEAD WO CONTRAST  Result Date: 01/14/2021 CLINICAL DATA:  Dizziness EXAM: CT HEAD WITHOUT CONTRAST TECHNIQUE: Contiguous axial images were obtained from the base of the skull through the vertex without intravenous contrast. COMPARISON:  None. FINDINGS: Brain: No evidence of acute infarction, hemorrhage, hydrocephalus, extra-axial collection or mass lesion/mass effect. Chronic atrophic changes and chronic white matter ischemic changes are seen. Lacunar infarcts are noted along the superior aspect of the basal ganglia bilaterally. Vascular: No hyperdense vessel or unexpected calcification. Skull: Normal. Negative for fracture or focal lesion. Sinuses/Orbits: No acute finding. Other: None. IMPRESSION: Chronic atrophic changes and ischemic changes without acute abnormality. Electronically Signed   By: Inez Catalina M.D.   On: 01/14/2021 23:51   US RENAL  Result Date: 01/14/2021 CLINICAL DATA:  Acute renal failure EXAM: RENAL / URINARY TRACT ULTRASOUND COMPLETE COMPARISON:  None. FINDINGS: Right Kidney: Renal measurements: 9.3 x 5.5 x 5.0 cm. = volume: 132 mL. Echogenicity within normal limits. No mass or hydronephrosis visualized. Left Kidney: Renal measurements: 10.8 x 6.0 x 5.8 cm. = volume: 195 mL. Echogenicity within normal limits. No mass or hydronephrosis visualized. Bladder: Appears normal for degree  of bladder distention. Other: None. IMPRESSION: No acute abnormality noted. Electronically Signed   By: Inez Catalina M.D.   On: 01/14/2021 22:42   DG Chest Port 1 View  Result Date: 01/14/2021 CLINICAL DATA:  Lightheadedness hypotension. EXAM: PORTABLE CHEST 1 VIEW COMPARISON:  CT chest dated January 28, 2019. Chest x-ray dated August 19, 2016. FINDINGS: Stable mild cardiomegaly. Normal pulmonary vascularity. Unchanged chronic scarring at the right lung base and costophrenic angle. No focal consolidation, pleural effusion, or pneumothorax. No acute osseous abnormality. IMPRESSION: No active disease. Electronically Signed   By: Titus Dubin M.D.   On: 01/14/2021 16:31    Micro Results     Recent Results (from the past 240 hour(s))  Culture, blood (routine x 2)     Status: None (Preliminary result)   Collection Time: 01/14/21  3:40 PM   Specimen: BLOOD LEFT FOREARM  Result Value Ref Range Status   Specimen Description   Final    BLOOD LEFT FOREARM Performed at [facility], [address] [place], [place] [address]    Special Requests   Final    BOTTLES DRAWN AEROBIC AND ANAEROBIC Blood Culture adequate volume Performed at [facility], [address] [place], [place] [address]    Culture   Final    NO GROWTH 2 DAYS Performed at [facility], [place] [address], [place], [place] [address]    Report Status PENDING  Incomplete  Resp Panel by RT-PCR (Flu A&B, Covid) Nasopharyngeal Swab     Status: None   Collection Time: 01/14/21  3:45 PM   Specimen: Nasopharyngeal Swab; Nasopharyngeal(NP) swabs in vial transport medium  Result Value Ref Range Status   SARS Coronavirus 2 by RT PCR NEGATIVE NEGATIVE Final    Comment: (NOTE) SARS-CoV-2 target nucleic acids are NOT DETECTED.  The SARS-CoV-2 RNA is generally detectable in upper respiratory specimens during the acute phase of infection. The lowest concentration of SARS-CoV-2 viral copies this assay can detect  is 138 copies/mL. A negative result does not preclude SARS-Cov-2 infection and should not be used as the sole basis for treatment or other patient management decisions. A negative result may occur with  improper specimen collection/handling, submission of specimen other than nasopharyngeal swab, presence of viral mutation(s) within the areas targeted by this assay, and inadequate number of viral copies(<138 copies/mL). A negative result must be combined with clinical observations, patient history, and epidemiological information. The expected result is Negative.  Fact Sheet for Patients:  EntrepreneurPulse.com.au  Fact Sheet for Healthcare Providers:  IncredibleEmployment.be  This test is no t yet approved or cleared by the [place] FDA and  has been authorized for detection and/or diagnosis of SARS-CoV-2 by FDA under an Emergency Use Authorization (EUA). This EUA will remain  in effect (meaning this test can be used) for the duration of the COVID-19 declaration under Section 564(b)(1) of the Act, 21 U.S.C.section 360bbb-3(b)(1), unless the authorization is terminated  or revoked sooner.       Influenza A by PCR NEGATIVE NEGATIVE Final   Influenza B by PCR NEGATIVE NEGATIVE  Final    Comment: (NOTE) The Xpert Xpress SARS-CoV-2/FLU/RSV plus assay is intended as an aid in the diagnosis of influenza from Nasopharyngeal swab specimens and should not be used as a sole basis for treatment. Nasal washings and aspirates are unacceptable for Xpert Xpress SARS-CoV-2/FLU/RSV testing.  Fact Sheet for Patients: EntrepreneurPulse.com.au  Fact Sheet for Healthcare Providers: IncredibleEmployment.be  This test is not yet approved or cleared by the [place] FDA and has been authorized for detection and/or diagnosis of SARS-CoV-2 by FDA under an Emergency Use Authorization (EUA). This EUA will remain in effect  (meaning this test can be used) for the duration of the COVID-19 declaration under Section 564(b)(1) of the Act, 21 U.S.C. section 360bbb-3(b)(1), unless the authorization is terminated or revoked.  Performed at [facility], [address] [place], [place] [address]   Culture, blood (routine x 2)     Status: None (Preliminary result)   Collection Time: 01/14/21  3:46 PM   Specimen: Right Antecubital; Blood  Result Value Ref Range Status   Specimen Description   Final    RIGHT ANTECUBITAL Performed at [facility], [address] [place], [place] [address]    Special Requests   Final    BOTTLES DRAWN AEROBIC AND ANAEROBIC Blood Culture adequate volume Performed at [facility], [address] [place], [place] [address]    Culture   Final    NO GROWTH 2 DAYS Performed at [facility], [place] [address] [place], [place] [address]    Report Status PENDING  Incomplete    Today   Subjective    Steven Barnett today has no headache,no chest abdominal pain,no new weakness tingling or numbness, feels much better wants to go home today and will not stay today in the hospital.     Objective   Blood pressure 119/63, pulse 65, temperature 98.3 F (36.8 C), temperature source Axillary, resp. rate 18, height 5\' 10"  (1.778 m), weight 126.4 kg, SpO2 95 %.   Intake/Output Summary (Last 24 hours) at 01/17/2021 0855 Last data filed at 01/17/2021 0300 Gross per 24 hour  Intake 730.83 ml  Output 2175 ml  Net -1444.17 ml    Exam  Awake Alert, No new F.N deficits, Normal affect Bone [place].AT,PERRAL Supple Neck,No JVD, No cervical lymphadenopathy appriciated.  Symmetrical Chest wall movement, Good air movement bilaterally, CTAB RRR,No Gallops,Rubs or new Murmurs, No Parasternal Heave +ve B.Sounds, Abd Soft, Non tender, No organomegaly appriciated, No rebound -guarding or rigidity. No Cyanosis, Clubbing or edema, No new Rash or bruise   Data Review   CBC  w Diff:  Lab Results  Component Value Date   WBC 6.8 01/17/2021   HGB 11.6 (L) 01/17/2021   HGB 15.5 08/10/2017   HGB 15.8 06/05/2017   HCT 35.4 (L) 01/17/2021   HCT 47.3 08/10/2017   HCT 48.7 06/05/2017   PLT 127 (L) 01/17/2021   PLT 742 (H) 08/10/2017   LYMPHOPCT 10 01/17/2021   LYMPHOPCT 9.8 (L) 06/05/2017   BANDSPCT 0 08/07/2016   MONOPCT 10 01/17/2021   MONOPCT 10.4 06/05/2017   EOSPCT 3 01/17/2021   EOSPCT 1.6 06/05/2017   BASOPCT 1 01/17/2021   BASOPCT 0.1 06/05/2017    CMP:  Lab Results  Component Value Date   NA 136 01/17/2021   NA 139 11/13/2019   NA 139 06/05/2017   K 3.8 01/17/2021   K 4.9 06/05/2017   CL 97 (L) 01/17/2021  CO2 31 01/17/2021   CO2 30 (H) 06/05/2017   BUN 31 (H) 01/17/2021   BUN 19 11/13/2019   BUN 19.3 06/05/2017   CREATININE 1.37 (H) 01/17/2021   CREATININE 1.2 06/05/2017   PROT 6.5 01/17/2021   PROT 7.3 08/10/2017   PROT 7.8 06/05/2017   ALBUMIN 3.5 01/17/2021   ALBUMIN 4.1 08/10/2017   ALBUMIN 3.7 06/05/2017   BILITOT 1.6 (H) 01/17/2021   BILITOT 0.4 08/10/2017   BILITOT 1.01 06/05/2017   ALKPHOS 57 01/17/2021   ALKPHOS 175 (H) 06/05/2017   AST 23 01/17/2021   AST 23 06/05/2017   ALT 17 01/17/2021   ALT 18 06/05/2017  . Lab Results  Component Value Date   INR 2.0 (H) 01/17/2021   INR 3.2 (H) 01/16/2021   INR 2.6 (H) 01/15/2021   PROTIME 13.0 12/22/2008     Total Time in preparing paper work, data evaluation and todays exam - 70 minutes  Lala Lund M.D on 01/17/2021 at 8:55 AM  [facility]

## 2021-01-16 NOTE — Evaluation (Signed)
Physical Therapy Evaluation Patient Details Name: Steven Barnett MRN: [id number] DOB: [birth year] Today's Date: 01/16/2021   History of Present Illness  70 y/o male presented to [facility] ED on 7/14 for complaints of dizziness, gait instability and hypotension. Found to be hypotensive and in Afib. Treated with IVF resuscitation and norepinephrine. PMH: Afib, chronic CHF, dyspnea, HTN, OSA, Chronic anticoagulation  Clinical Impression  PTA, patient states he lives with daughter and reports independence with mobility with no AD. Patient presents with generalized weakness, impaired balance, decreased activity tolerance, and decreased safety awareness. Patient initially requiring minA for sit to stand transfer due to unsteadiness with subsequent trials patient required min guard and able to self steady. Ambulated 20' with no AD and minA for balance due to wide BOS and decreased stance time bilaterally. Patient with decreased awareness of deficits and need for assistance for safety. Educated on OOB mobility to bathroom with nursing staff to increase activity throughout day due to weakness, patient verbalized understanding but needed reminding at end of session on use of call bell to call for assistance. Patient will benefit from skilled PT services during acute stay to address listed deficits. Recommend HHPT following discharge to maximize functional independence and safety and to address balance and strength deficits.     Follow Up Recommendations Home health PT;Supervision/Assistance - 24 hour    Equipment Recommendations  None recommended by PT    Recommendations for Other Services       Precautions / Restrictions Precautions Precautions: Fall Restrictions Weight Bearing Restrictions: No      Mobility  Bed Mobility Overal bed mobility: Needs Assistance Bed Mobility: Supine to Sit;Sit to Supine     Supine to sit: Min assist Sit to supine: Supervision   General bed mobility comments: minA for  trunk elevation to EOB    Transfers Overall transfer level: Needs assistance Equipment used: 1 person hand held assist Transfers: Sit to/from Stand Sit to Stand: Min assist;Min guard         General transfer comment: Initially required minA to rise into standing and steady. Patient performed 4 more STS with min guard for safety due to unsteadiness but able to self correct.  Ambulation/Gait Ambulation/Gait assistance: Min assist Gait Distance (Feet): 20 Feet Assistive device: None Gait Pattern/deviations: Step-to pattern;Decreased stride length;Decreased weight shift to left;Decreased weight shift to right;Decreased stance time - left;Decreased stance time - right;Wide base of support Gait velocity: decreased   General Gait Details: Unsteady during short ambulation distance requiring minA to steady. Decreased awareness of need for assistance for safety during mobility. Patient declined further ambulation  Stairs            Wheelchair Mobility    Modified Rankin (Stroke Patients Only)       Balance Overall balance assessment: Mild deficits observed, not formally tested                                           Pertinent Vitals/Pain Pain Assessment: No/denies pain    Home Living Family/patient expects to be discharged to:: Private residence Living Arrangements: Children Available Help at Discharge: Family;Available PRN/intermittently Type of Home: House Home Access: Stairs to enter Entrance Stairs-Rails: None Entrance Stairs-Number of Steps: 2 Home Layout: Two level Home Equipment: Cane - single point      Prior Function Level of Independence: Independent         Comments:  reports living with daughter currently but was planning on visiting [place] soon where they have a condo. Difficult to obtain full PLOF due to patient irritability due to current hospitalization     Hand Dominance        Extremity/Trunk Assessment   Upper  Extremity Assessment Upper Extremity Assessment: Generalized weakness    Lower Extremity Assessment Lower Extremity Assessment: Generalized weakness    Cervical / Trunk Assessment Cervical / Trunk Assessment: Kyphotic  Communication   Communication: No difficulties  Cognition Arousal/Alertness: Awake/alert Behavior During Therapy: Agitated Overall Cognitive Status: Within Functional Limits for tasks assessed                                        General Comments      Exercises     Assessment/Plan    PT Assessment Patient needs continued PT services  PT Problem List Decreased strength;Decreased activity tolerance;Decreased balance;Decreased mobility;Decreased safety awareness;Obesity       PT Treatment Interventions DME instruction;Gait training;Stair training;Functional mobility training;Therapeutic activities;Balance training;Therapeutic exercise;Patient/family education    PT Goals (Current goals can be found in the Care Plan section)  Acute Rehab PT Goals Patient Stated Goal: to go home PT Goal Formulation: With patient Time For Goal Achievement: 01/30/21 Potential to Achieve Goals: Good    Frequency Min 3X/week   Barriers to discharge        Co-evaluation               AM-PAC PT "6 Clicks" Mobility  Outcome Measure Help needed turning from your back to your side while in a flat bed without using bedrails?: A Little Help needed moving from lying on your back to sitting on the side of a flat bed without using bedrails?: A Little Help needed moving to and from a bed to a chair (including a wheelchair)?: A Little Help needed standing up from a chair using your arms (e.g., wheelchair or bedside chair)?: A Little Help needed to walk in hospital room?: A Little Help needed climbing 3-5 steps with a railing? : A Little 6 Click Score: 18    End of Session Equipment Utilized During Treatment: Gait belt Activity Tolerance: Patient tolerated  treatment well Patient left: in bed;with call bell/phone within reach;with bed alarm set Nurse Communication: Mobility status PT Visit Diagnosis: Unsteadiness on feet (R26.81);Muscle weakness (generalized) (M62.81)    Time: 4496-7591 PT Time Calculation (min) (ACUTE ONLY): 22 min   Charges:   PT Evaluation $PT Eval Moderate Complexity: 1 Mod          Yacine Garriga A. Gilford Rile PT, DPT Acute Rehabilitation Services Pager [phone] Office [phone]   Linna Hoff 01/16/2021, 10:18 AM

## 2021-01-16 NOTE — TOC Transition Note (Addendum)
Transition of Care [facility]) - CM/SW Discharge Note   Patient Details  Name: Steven Barnett MRN: [id number] Date of Birth: 1951-06-10  Transition of Care [facility]) CM/SW Contact:  Carles Collet, RN Phone Number: 01/16/2021, 9:44 AM   Clinical Narrative:    Spoke w patient at bedside, provided with 30 day Eliquis coupon. Discussed [place] services. He declined. He states that he has a cane at home, declined needing any DME. Updated nurse that he is asking about DC date.     Final next level of care: Home/Self Care Barriers to Discharge: No Barriers Identified   Patient Goals and CMS Choice Patient states their goals for this hospitalization and ongoing recovery are:: to g ohome   Choice offered to / list presented to : NA  Discharge Placement                       Discharge Plan and Services                            [place]: NA        Social Determinants of Health (SDOH) Interventions     Readmission Risk Interventions No flowsheet data found.

## 2021-01-16 NOTE — Progress Notes (Signed)
Occupational Therapy Evaluation Patient Details Name: Steven Barnett MRN: [id number] DOB: [birth year] Today's Date: 01/16/2021    History of Present Illness 70 y/o male presented to [facility] ED on 7/14 for complaints of dizziness, gait instability and hypotension. Found to be hypotensive and in Afib. Treated with IVF resuscitation and norepinephrine. PMH: Afib, chronic CHF, dyspnea, HTN, OSA, Chronic anticoagulation   Clinical Impression   PTA pt lives with daughter/family and is independent with mobility and ADL. Drives and takes his grand children the pool and swims with them daily. Demonstrates generalized weakness from immobility. Able to ambulate and complete ADL task @ RW level with [place] A. Recommend pt OOB for all meals and ambulate with staff using RW. Pt agreeable to using RW initially after DC. Will follow acutely to facilitate safe DC home.     Follow Up Recommendations  No OT follow up;Supervision - Intermittent    Equipment Recommendations  None recommended by OT    Recommendations for Other Services       Precautions / Restrictions Precautions Precautions: Fall      Mobility Bed Mobility Overal bed mobility: Needs Assistance Bed Mobility: Supine to Sit;Sit to Supine     Supine to sit: Supervision Sit to supine: Supervision        Transfers Overall transfer level: Needs assistance Equipment used: Rolling walker (2 wheeled) Transfers: Sit to/from Stand Sit to Stand: Min guard              Balance Overall balance assessment: Mild deficits observed, not formally tested                                         ADL either performed or assessed with clinical judgement   ADL Overall ADL's : Needs assistance/impaired     Grooming: Supervision/safety;Standing   Upper Body Bathing: Set up;Sitting   Lower Body Bathing: Min guard;Sit to/from stand   Upper Body Dressing : Set up;Sitting   Lower Body Dressing: Min guard   Toilet  Transfer: Min guard   Toileting- Clothing Manipulation and Hygiene: Supervision/safety       Functional mobility during ADLs: Min guard;Rolling walker;Cueing for safety General ADL Comments: Educated on strategies to reduce falls. Pt initially stating he did not need any DME or make any changes to his dialy routine. "I'm not an invalid". After mobilizing, pt agreeable to using the RW with mobility and his shower chair at home. Bedroom is upstaris. Recommend he not attempt stairs until he is able to complete them with PT PTA. If stairs are difficult, recommend sleeing in his recliner downstaris until he is stronger. Unsure if pt understands.     Vision         Perception     Praxis      Pertinent Vitals/Pain Pain Assessment: Faces Faces Pain Scale: Hurts a little bit Pain Location: knee stiffness Pain Descriptors / Indicators: Discomfort     Hand Dominance Right   Extremity/Trunk Assessment Upper Extremity Assessment Upper Extremity Assessment: Overall WFL for tasks assessed   Lower Extremity Assessment Lower Extremity Assessment: Defer to PT evaluation   Cervical / Trunk Assessment Cervical / Trunk Assessment: Kyphotic   Communication Communication Communication: No difficulties   Cognition Arousal/Alertness: Awake/alert Behavior During Therapy: WFL for tasks assessed/performed Overall Cognitive Status: No family/caregiver present to determine baseline cognitive functioning  General Comments: tangential at times   General Comments  VSS    Exercises Exercises: Other exercises Other Exercises Other Exercises: encouraged bed level general BUE/LE AROM   Shoulder Instructions      Home Living Family/patient expects to be discharged to:: Private residence Living Arrangements: Children Available Help at Discharge: Family;Available PRN/intermittently Type of Home: House Home Access: Stairs to enter [employer] of Steps: 2 Entrance Stairs-Rails: None Home Layout: Two level Alternate Level Stairs-Number of Steps: flight Alternate Level Stairs-Rails: Right;Left Bathroom Shower/Tub: Teacher, early years/pre: Standard Bathroom Accessibility: Yes How Accessible: Accessible via walker Home Equipment: Cane - single point;Shower seat          Prior Functioning/Environment Level of Independence: Independent        Comments: reports living with daughter currently; takes his grand children to the pool daily and swims        OT Problem List: Decreased strength;Decreased activity tolerance;Decreased safety awareness;Decreased knowledge of use of DME or AE;Obesity;Pain      OT Treatment/Interventions: Self-care/ADL training;Energy conservation;DME and/or AE instruction;Therapeutic activities;Patient/family education;Therapeutic exercise    OT Goals(Current goals can be found in the care plan section) Acute Rehab OT Goals Patient Stated Goal: to go home OT Goal Formulation: With patient Time For Goal Achievement: 01/30/21 Potential to Achieve Goals: Good  OT Frequency: Min 2X/week   Barriers to D/C:            Co-evaluation              AM-PAC OT "6 Clicks" Daily Activity     Outcome Measure Help from another person eating meals?: None Help from another person taking care of personal grooming?: A Little Help from another person toileting, which includes using toliet, bedpan, or urinal?: A Little Help from another person bathing (including washing, rinsing, drying)?: A Little Help from another person to put on and taking off regular upper body clothing?: A Little Help from another person to put on and taking off regular lower body clothing?: A Little 6 Click Score: 19   End of Session Equipment Utilized During Treatment: Rolling walker Nurse Communication: Mobility status  Activity Tolerance: Patient tolerated treatment well Patient left: in bed;with  call bell/phone within reach;with bed alarm set  OT Visit Diagnosis: Unsteadiness on feet (R26.81);Muscle weakness (generalized) (M62.81);Pain Pain - Right/Left:  (B) Pain - part of body: Knee                Time: 7903-8333 OT Time Calculation (min): 28 min Charges:  OT General Charges $OT Visit: 1 Visit OT Evaluation $OT Eval Low Complexity: 1 Low OT Treatments $Self Care/Home Management : 8-22 mins  Maurie Boettcher, OT/L   Acute OT Clinical Specialist [place] Pager [phone] Office [phone]   [facility] 01/16/2021, 4:17 PM

## 2021-01-17 LAB — CBC WITH DIFFERENTIAL/PLATELET
Abs Immature Granulocytes: 0.04 10*3/uL (ref 0.00–0.07)
Basophils Absolute: 0.1 10*3/uL (ref 0.0–0.1)
Basophils Relative: 1 %
Eosinophils Absolute: 0.2 10*3/uL (ref 0.0–0.5)
Eosinophils Relative: 3 %
HCT: 35.4 % — ABNORMAL LOW (ref 39.0–52.0)
Hemoglobin: 11.6 g/dL — ABNORMAL LOW (ref 13.0–17.0)
Immature Granulocytes: 1 %
Lymphocytes Relative: 10 %
Lymphs Abs: 0.7 10*3/uL (ref 0.7–4.0)
MCH: 27.5 pg (ref 26.0–34.0)
MCHC: 32.8 g/dL (ref 30.0–36.0)
MCV: 83.9 fL (ref 80.0–100.0)
Monocytes Absolute: 0.7 10*3/uL (ref 0.1–1.0)
Monocytes Relative: 10 %
Neutro Abs: 5.1 10*3/uL (ref 1.7–7.7)
Neutrophils Relative %: 75 %
Platelets: 127 10*3/uL — ABNORMAL LOW (ref 150–400)
RBC: 4.22 MIL/uL (ref 4.22–5.81)
RDW: 15.8 % — ABNORMAL HIGH (ref 11.5–15.5)
WBC: 6.8 10*3/uL (ref 4.0–10.5)
nRBC: 0 % (ref 0.0–0.2)

## 2021-01-17 LAB — COMPREHENSIVE METABOLIC PANEL
ALT: 17 U/L (ref 0–44)
AST: 23 U/L (ref 15–41)
Albumin: 3.5 g/dL (ref 3.5–5.0)
Alkaline Phosphatase: 57 U/L (ref 38–126)
Anion gap: 8 (ref 5–15)
BUN: 31 mg/dL — ABNORMAL HIGH (ref 8–23)
CO2: 31 mmol/L (ref 22–32)
Calcium: 9.6 mg/dL (ref 8.9–10.3)
Chloride: 97 mmol/L — ABNORMAL LOW (ref 98–111)
Creatinine, Ser: 1.37 mg/dL — ABNORMAL HIGH (ref 0.61–1.24)
GFR, Estimated: 56 mL/min — ABNORMAL LOW (ref >60)
Glucose, Bld: 89 mg/dL (ref 70–99)
Potassium: 3.8 mmol/L (ref 3.5–5.1)
Sodium: 136 mmol/L (ref 135–145)
Total Bilirubin: 1.6 mg/dL — ABNORMAL HIGH (ref 0.3–1.2)
Total Protein: 6.5 g/dL (ref 6.5–8.1)

## 2021-01-17 LAB — PROTIME-INR
INR: 2 — ABNORMAL HIGH (ref 0.8–1.2)
Prothrombin Time: 22.8 seconds — ABNORMAL HIGH (ref 11.4–15.2)

## 2021-01-17 LAB — MAGNESIUM: Magnesium: 1.2 mg/dL — ABNORMAL LOW (ref 1.7–2.4)

## 2021-01-17 MED ORDER — DILTIAZEM HCL ER COATED BEADS 120 MG PO CP24
120.0000 mg | ORAL_CAPSULE | Freq: Every day | ORAL | Status: DC
  Administered 2021-01-17: 120 mg via ORAL
  Filled 2021-01-17: qty 1

## 2021-01-17 MED ORDER — POTASSIUM CHLORIDE CRYS ER 10 MEQ PO TBCR
10.0000 meq | EXTENDED_RELEASE_TABLET | Freq: Once | ORAL | Status: AC
  Administered 2021-01-17: 10 meq via ORAL
  Filled 2021-01-17: qty 1

## 2021-01-17 MED ORDER — POTASSIUM CHLORIDE CRYS ER 10 MEQ PO TBCR: 10.0000 meq | EXTENDED_RELEASE_TABLET | Freq: Every day | ORAL | 0 refills | Status: DC

## 2021-01-17 MED ORDER — FUROSEMIDE 40 MG PO TABS: 40.0000 mg | ORAL_TABLET | Freq: Once | ORAL | 0 refills | Status: DC

## 2021-01-17 MED ORDER — WARFARIN SODIUM 4 MG PO TABS
8.0000 mg | ORAL_TABLET | Freq: Once | ORAL | Status: DC
  Filled 2021-01-17: qty 2

## 2021-01-17 MED ORDER — MAGNESIUM SULFATE 2 GM/50ML IV SOLN
2.0000 g | Freq: Once | INTRAVENOUS | Status: AC
  Administered 2021-01-17: 2 g via INTRAVENOUS
  Filled 2021-01-17: qty 50

## 2021-01-17 MED ORDER — MAGNESIUM SULFATE IN D5W 1-5 GM/100ML-% IV SOLN
1.0000 g | Freq: Once | INTRAVENOUS | Status: AC
  Administered 2021-01-17: 1 g via INTRAVENOUS
  Filled 2021-01-17: qty 100

## 2021-01-17 MED ORDER — FUROSEMIDE 40 MG PO TABS
40.0000 mg | ORAL_TABLET | Freq: Once | ORAL | Status: AC
  Administered 2021-01-17: 40 mg via ORAL
  Filled 2021-01-17: qty 1

## 2021-01-17 NOTE — Plan of Care (Signed)

## 2021-01-17 NOTE — Progress Notes (Signed)
Physical Therapy Treatment Patient Details Name: Steven Barnett MRN: [id number] DOB: 1951-03-19 Today's Date: 01/17/2021    History of Present Illness 70 y/o male presented to [facility] ED on 7/14 for complaints of dizziness, gait instability and hypotension. Found to be hypotensive and in Afib. Treated with IVF resuscitation and norepinephrine. PMH: Afib, chronic CHF, dyspnea, HTN, OSA, Chronic anticoagulation    PT Comments    Patient received in bed, eager to get up and mobilize OOB today. Cooperative but tangential and required cues for redirection today. Tolerated gait training in room today but distance limited by fatigue and HR elevation to 126BPM with activity. Left up in recliner with all needs met, chair alarm active. Will continue to follow. Definitely do recommend RW for home use at DC.    Follow Up Recommendations  Home health PT;Supervision/Assistance - 24 hour     Equipment Recommendations  Rolling walker with 5" wheels    Recommendations for Other Services       Precautions / Restrictions Precautions Precautions: Fall Restrictions Weight Bearing Restrictions: No    Mobility  Bed Mobility Overal bed mobility: Needs Assistance Bed Mobility: Supine to Sit     Supine to sit: Min assist     General bed mobility comments: MinA for trunk elevation to get to EOB even with HOB moderately elevated    Transfers Overall transfer level: Needs assistance Equipment used: Rolling walker (2 wheeled) Transfers: Sit to/from Stand Sit to Stand: Min guard         General transfer comment: initially needed MinA to boost to standing even with very wide BOS, eventually able to fade to min guard with increased time and effort  Ambulation/Gait Ambulation/Gait assistance: Min guard Gait Distance (Feet): 30 Feet Assistive device: Rolling walker (2 wheeled) Gait Pattern/deviations: Step-through pattern;Trunk flexed;Wide base of support;Decreased step length - right;Decreased step  length - left Gait velocity: decreased   General Gait Details: slow and effortful gait but more balanced with RW, only needed min guard for balance today. HR to 126BPM and easily fatigued   Chief Strategy Officer    Modified Rankin (Stroke Patients Only)       Balance Overall balance assessment: Mild deficits observed, not formally tested                                          Cognition Arousal/Alertness: Awake/alert Behavior During Therapy: WFL for tasks assessed/performed Overall Cognitive Status: No family/caregiver present to determine baseline cognitive functioning                                 General Comments: tangential at times, also easily distractable at times      Exercises      General Comments General comments (skin integrity, edema, etc.): HR to 126BPM with activity in room      Pertinent Vitals/Pain Pain Assessment: Faces Faces Pain Scale: Hurts a little bit Pain Location: knee stiffness Pain Descriptors / Indicators: Discomfort Pain Intervention(s): Limited activity within patient's tolerance;Monitored during session    Home Living                      Prior Function            PT Goals (current  goals can now be found in the care plan section) Acute Rehab PT Goals Patient Stated Goal: to go home PT Goal Formulation: With patient Time For Goal Achievement: 01/30/21 Potential to Achieve Goals: Good Progress towards PT goals: Progressing toward goals    Frequency    Min 3X/week      PT Plan Current plan remains appropriate;Equipment recommendations need to be updated    Co-evaluation              AM-PAC PT "6 Clicks" Mobility   Outcome Measure  Help needed turning from your back to your side while in a flat bed without using bedrails?: A Little Help needed moving from lying on your back to sitting on the side of a flat bed without using bedrails?: A  Little Help needed moving to and from a bed to a chair (including a wheelchair)?: A Little Help needed standing up from a chair using your arms (e.g., wheelchair or bedside chair)?: A Little Help needed to walk in hospital room?: A Little Help needed climbing 3-5 steps with a railing? : A Little 6 Click Score: 18    End of Session   Activity Tolerance: Patient tolerated treatment well Patient left: in chair;with call bell/phone within reach;with chair alarm set Nurse Communication: Mobility status PT Visit Diagnosis: Unsteadiness on feet (R26.81);Muscle weakness (generalized) (M62.81)     Time: 1610-9604 PT Time Calculation (min) (ACUTE ONLY): 24 min  Charges:  $Gait Training: 8-22 mins $Therapeutic Activity: 8-22 mins                    Windell Norfolk, DPT, PN1   Supplemental Physical Therapist [place]    Pager [phone] Acute Rehab Office [phone]

## 2021-01-17 NOTE — Progress Notes (Signed)
ANTICOAGULATION CONSULT NOTE - Initial Consult  Pharmacy Consult for Warfarin Indication: atrial fibrillation  No Known Allergies  Patient Measurements: Height: 5\' 10"  (177.8 cm) Weight: 126.4 kg (278 lb 10.6 oz) IBW/kg (Calculated) : 73  Vital Signs: Temp: 98.3 F (36.8 C) (07/17 0425) Temp Source: Axillary (07/17 0425) BP: 119/63 (07/17 0425) Pulse Rate: 65 (07/17 0425)  Labs: Recent Labs    01/14/21 1519 01/14/21 1559 01/14/21 1722 01/14/21 2157 01/15/21 0435 01/16/21 0048 01/17/21 0146  HGB 13.1 11.6*  --   --  11.8*  --  11.6*  HCT 40.1 34.0*  --   --  36.7*  --  35.4*  PLT 141*  --   --   --  121*  --  127*  LABPROT 26.6*  --   --   --  28.1* 32.4* 22.8*  INR 2.5*  --   --   --  2.6* 3.2* 2.0*  CREATININE 4.13*  --   --    < > 2.57* 1.32* 1.37*  TROPONINIHS 13  --  9  --   --   --   --    < > = values in this interval not displayed.     Estimated Creatinine Clearance: 67.9 mL/min (A) (by C-G formula based on SCr of 1.37 mg/dL (H)).    Medical History: Past Medical History:  Diagnosis Date   Anemia    Arthralgia of both knees    Arthritis    "right knee" (10/01/2015)   Atrial fibrillation (HCC)    Cancer (HCC)    STAGE 1 COLON CANCER   Chronic anticoagulation 2010   Coumadin   Chronic diastolic CHF (congestive heart failure), NYHA class 2 ([place])    Dyspnea    Dysrhythmia    Hypertension    OSA (obstructive sleep apnea) 2016   "couldn't take the mask during the testing" (10/01/2015)   Permanent atrial fibrillation ([place]) 2010   Pneumonia 3/29/2017and june 2017    Medications:  Medications Prior to Admission  Medication Sig Dispense Refill Last Dose   acetaminophen (TYLENOL) 500 MG tablet Take 1,000 mg by mouth 2 (two) times daily.   01/14/2021   albuterol (PROVENTIL HFA;VENTOLIN HFA) 108 (90 Base) MCG/ACT inhaler Inhale 2 puffs into the lungs every 6 (six) hours as needed for wheezing or shortness of breath. 1 Inhaler 2 Past Month   aspirin EC 81  MG tablet Take 81 mg by mouth daily.   01/14/2021   diltiazem (CARDIZEM) 120 MG tablet TAKE 1 TABLET BY MOUTH ONCE DAILY 30 tablet 6 01/14/2021   furosemide (LASIX) 80 MG tablet Take 1 tablet (80 mg total) by mouth 2 (two) times daily. 180 tablet 3 01/14/2021   hydrALAZINE (APRESOLINE) 100 MG tablet Take 1 tablet (100 mg total) by mouth 2 (two) times daily. 180 tablet 3 01/14/2021   metolazone (ZAROXOLYN) 5 MG tablet TAKE 1 TABLET BY MOUTH TWICE A WEEK ON  WEDNESDAYS  AND  SATURDAYS 15 tablet 6 01/13/2021   metoprolol tartrate (LOPRESSOR) 25 MG tablet Take 1 tablet (25 mg total) by mouth 2 (two) times daily. 180 tablet 3 01/14/2021 at 0800   MITIGARE 0.6 MG CAPS Take 1 capsule by mouth 2 (two) times daily.   01/14/2021   potassium chloride SA (KLOR-CON) 20 MEQ tablet TAKE 1  BY MOUTH ONCE DAILY (Patient taking differently: Take 20 mEq by mouth daily.) 90 tablet 3 01/14/2021   Turmeric (QC TUMERIC COMPLEX PO) Take 1 tablet by mouth daily.  01/14/2021   valsartan (DIOVAN) 320 MG tablet Take 1 tablet (320 mg total) by mouth daily. 90 tablet 3 01/14/2021   warfarin (COUMADIN) 4 MG tablet TAKE 1 TO 2 TABLETS AS DIRECTED BY COUMADIN CLINIC 60 tablet 1    Scheduled:   (feeding supplement) PROSource Plus  30 mL Oral BID BM   aspirin EC  81 mg Oral Daily   diltiazem  120 mg Oral Daily   feeding supplement  237 mL Oral Q24H   furosemide  40 mg Oral Once   potassium chloride  10 mEq Oral Once   Warfarin - Pharmacist Dosing Inpatient   Does not apply q1600   Infusions:   magnesium sulfate bolus IVPB      Assessment: 63 yom with PMH of hypertension, atrial fibrillation on warfarin, HFpEF, and OSA. Patient being evaluated for hypovolemic shock secondary to diuresis and significant weight loss.  Patient takes 4 mg tablet by mouth on Monday, Wednesday and Fridays, then take two of the 4 mg tablets to make total of 8 mg by mouth all other days per patient. Last taken 7/13 per patient.   INR was elevated at 3.2  on 7/16 and dose was reduced to 2mg  x1day.   INR within range at 2.0 today. Resume home dosing schedule with 8mg  today, then dose based on INR tomorrow.   Goal of Therapy:  INR 2-3 Monitor platelets by anticoagulation protocol: Yes   Plan:  Give dose of Coumadin 8mg  today.  Daily INR for now  Donald Pore, PharmD Pharmacy Resident 01/17/2021, 9:38 AM

## 2021-01-18 ENCOUNTER — Other Ambulatory Visit: Payer: Self-pay | Admitting: Gastroenterology

## 2021-01-19 LAB — CULTURE, BLOOD (ROUTINE X 2)
Culture: NO GROWTH
Culture: NO GROWTH
Special Requests: ADEQUATE
Special Requests: ADEQUATE

## 2021-01-19 NOTE — Telephone Encounter (Signed)
Patient with diagnosis of afib on warfarin for anticoagulation.    Procedure: colonoscopy Date of procedure: 02/12/21  CHA2DS2-VASc Score = 3  This indicates a 3.2% annual risk of stroke. The patient's score is based upon: CHF History: Yes HTN History: Yes Diabetes History: No Stroke History: No Vascular Disease History: No Age Score: 1 Gender Score: 0   CrCl 68 mL/min using adjusted body weight (recent AKI during hospitalization but Scr has trended down, unclear baseline) Platelet count 127K   Per office protocol, patient can hold warfarin for 5 days prior to procedure. Patient will not need bridging with Lovenox around procedure.

## 2021-01-19 NOTE — Telephone Encounter (Signed)
Patient currently admitted.  Will defer preoperative cardiac evaluation to follow-up visit and remove from preoperative cardiac clearance pool.  Jossie Ng. Melonie Germani NP-C    01/19/2021, 3:56 PM [place] [address] Office [phone] Fax [phone]

## 2021-01-27 ENCOUNTER — Telehealth (INDEPENDENT_AMBULATORY_CARE_PROVIDER_SITE_OTHER): Payer: Medicare HMO | Admitting: Family

## 2021-01-27 ENCOUNTER — Encounter (HOSPITAL_BASED_OUTPATIENT_CLINIC_OR_DEPARTMENT_OTHER): Payer: Self-pay | Admitting: Family

## 2021-01-27 VITALS — BP 133/99 | Ht 70.0 in | Wt 256.0 lb

## 2021-01-27 DIAGNOSIS — Z7901 Long term (current) use of anticoagulants: Secondary | ICD-10-CM

## 2021-01-27 DIAGNOSIS — I5032 Chronic diastolic (congestive) heart failure: Secondary | ICD-10-CM | POA: Diagnosis not present

## 2021-01-27 DIAGNOSIS — I4821 Permanent atrial fibrillation: Secondary | ICD-10-CM

## 2021-01-27 DIAGNOSIS — I10 Essential (primary) hypertension: Secondary | ICD-10-CM

## 2021-01-27 DIAGNOSIS — E876 Hypokalemia: Secondary | ICD-10-CM | POA: Diagnosis not present

## 2021-01-27 MED ORDER — POTASSIUM CHLORIDE CRYS ER 10 MEQ PO TBCR: 10.0000 meq | EXTENDED_RELEASE_TABLET | Freq: Every day | ORAL | 1 refills | Status: DC

## 2021-01-27 MED ORDER — FUROSEMIDE 40 MG PO TABS: 40.0000 mg | ORAL_TABLET | Freq: Every day | ORAL | 1 refills | Status: DC

## 2021-01-27 NOTE — Patient Instructions (Signed)
PLEASE CALL [phone] TO SCHEDULE YOUR OVERDUE INR CHECK.  Medication Instructions:  Your physician has recommended you make the following change in your medication:   CHANGE Furosemide to one 40mg  tablet daily. A refill has been sent to [facility].    *If you need a refill on your cardiac medications before your next appointment, please call your pharmacy*   Lab Work: Your physician recommends that you return for lab work on the day of your INR check at [facility] to have BMP. When you are there for your INR check, just let them know you need lab work as well. The orders have been placed.   If you have labs (blood work) drawn today and your tests are completely normal, you will receive your results only by: [place] (if you have MyChart) OR A paper copy in the mail If you have any lab test that is abnormal or we need to change your treatment, we will call you to review the results.   Testing/Procedures: None ordered today.    Follow-Up: At [facility], you and your health needs are our priority.  As part of our continuing mission to provide you with exceptional heart care, we have created designated Provider Care Teams.  These Care Teams include your primary Cardiologist (physician) and Advanced Practice Providers (APPs -  Physician Assistants and Nurse Practitioners) who all work together to provide you with the care you need, when you need it.  We recommend signing up for the patient portal called "MyChart".  Sign up information is provided on this After Visit Summary.  MyChart is used to connect with patients for Virtual Visits (Telemedicine).  Patients are able to view lab/test results, encounter notes, upcoming appointments, etc.  Non-urgent messages can be sent to your provider as well.   To learn more about what you can do with MyChart, go to [email or web address].    Your next appointment:   September 26th at 10:45 AM with Almyra Deforest, PA-C  Other Instructions  Heart  Healthy Diet Recommendations: A low-salt diet is recommended. Meats should be grilled, baked, or boiled. Avoid fried foods. Focus on lean protein sources like fish or chicken with vegetables and fruits. The American Heart Association is a Microbiologist!  American Heart Association Diet and Lifeystyle Recommendations   Exercise recommendations: The American Heart Association recommends 150 minutes of moderate intensity exercise weekly. Try 30 minutes of moderate intensity exercise 4-5 times per week. This could include walking, jogging, or swimming.  Recommend weighing daily and keeping a log. Please call our office if you have weight gain of 2 pounds overnight or 5 pounds in 1 week.   Contact our office if your blood pressure is consistently more than 130/80 or less than 110/60.  PLEASE CALL [phone] TO SCHEDULE YOUR OVERDUE INR CHECK.

## 2021-01-27 NOTE — Progress Notes (Signed)
Virtual Visit via Video Note   This visit type was conducted due to national recommendations for restrictions regarding the COVID-19 Pandemic (e.g. social distancing) in an effort to limit this patient's exposure and mitigate transmission in our community.  Due to his co-morbid illnesses, this patient is at least at moderate risk for complications without adequate follow up.  This format is felt to be most appropriate for this patient at this time.  All issues noted in this document were discussed and addressed.  A limited physical exam was performed with this format.  Please refer to the patient's chart for his consent to telehealth for [facility].       Date:  01/27/2021   ID:  [name], DOB 18-Feb-1951, MRN [id number] The patient was identified using 2 identifiers.  Patient Location: Home Provider Location: Office/Clinic   PCP:  Elisabeth Cara, [place] Providers Cardiologist:  Pixie Casino, MD     Evaluation Performed:  Follow-Up Visit  Chief Complaint:  Hospital follow up  History of Present Illness:    Steven Barnett is a 70 y.o. male with hypertension, sleep disorder consistent with OSA, diastolic hart failure with moderate LVH, permanent atrial fibrillation.   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 He developed atrial fibrillation postoperatively. He has been in atrial fibrillation since that time. At that time he was diagnosed with diastolic heart failure and found to have moderate LVH by echocardiogram.   He established with Dr. [name] in 2017 after being incarcerated for several years prior. His Warfarin was not adjusted regularly during that time. He was treated for colon cancer in 2017.   He was last seen by Dr. [name] 08/27/20 and doing well form a cardiac perspective.   He was hospitalized 01/14/21 due to symptomatic hypotension due to severe hypovolemia and AKI. E noted  unintentional weight loss of 40 pounds over the last 4 months. AKI and BP improved with IVF. He was discharged on Diltiazem 120mg , Lasix 40mg  QD, Potassium. His Hydralazine, metolazone, metoprolol, and valsartan were discontinued.   Follow up today via video visit. His wife travels to [place] for work and they are currently there. BP has been 110s-130s since discharge. Weights have been overall stable at home but not checking daily. Reports no shortness of breath and his dyspnea on exertion is stable on exertion, only occurring with more than usual activity. Reports no chest pain, pressure, or tightness. No edema, orthopnea, PND. Reports no palpitations. No lightheadedness, dizziness, near syncope, syncope.   The patient does not have symptoms concerning for COVID-19 infection (fever, chills, cough, or new shortness of breath).    Past Medical History:  Diagnosis Date   Anemia    Arthralgia of both knees    Arthritis    "right knee" (10/01/2015)   Atrial fibrillation ([place])    Cancer ([place])    STAGE 1 COLON CANCER   Chronic anticoagulation 2010   Coumadin   Chronic diastolic CHF (congestive heart failure), NYHA class 2 ([place])    Dyspnea    Dysrhythmia    Hypertension    OSA (obstructive sleep apnea) 2016   "couldn't take the mask during the testing" (10/01/2015)   Permanent atrial fibrillation ([place]) 2010   Pneumonia 3/29/2017and june 2017   Past Surgical History:  Procedure Laterality Date   COLONOSCOPY WITH PROPOFOL N/A 03/04/2016   Procedure: COLONOSCOPY WITH PROPOFOL;  Surgeon: Carol Ada, MD;  Location:  [facility];  Service: Endoscopy;  Laterality: N/A;   COLONOSCOPY WITH PROPOFOL N/A 09/15/2017   Procedure: COLONOSCOPY WITH PROPOFOL;  Surgeon: Carol Ada, MD;  Location: [facility];  Service: Endoscopy;  Laterality: N/A;   LAPAROSCOPIC PARTIAL COLECTOMY N/A 04/12/2016   Procedure: LAPAROSCOPIC ILEOCOLECTOMY;  Surgeon: [name], MD;  Location: [place];  Service: General;   Laterality: N/A;   PORT-A-CATH REMOVAL N/A 02/16/2017   Procedure: REMOVAL PORT-A-CATH;  Surgeon: [name], MD;  Location: [place];  Service: General;  Laterality: N/A;   PORTACATH PLACEMENT N/A 05/18/2016   Procedure: INSERTION PORT-A-CATH;  Surgeon: [name], MD;  Location: [place];  Service: General;  Laterality: N/A;   THORACOTOMY Right 2010     Current Meds  Medication Sig   acetaminophen (TYLENOL) 500 MG tablet Take 1,000 mg by mouth 2 (two) times daily.   albuterol (PROVENTIL HFA;VENTOLIN HFA) 108 (90 Base) MCG/ACT inhaler Inhale 2 puffs into the lungs every 6 (six) hours as needed for wheezing or shortness of breath.   aspirin EC 81 MG tablet Take 81 mg by mouth daily.   diltiazem (CARDIZEM) 120 MG tablet TAKE 1 TABLET BY MOUTH ONCE DAILY   MITIGARE 0.6 MG CAPS Take 1 capsule by mouth 2 (two) times daily.   pantoprazole (PROTONIX) 40 MG tablet Take 1 tablet (40 mg total) by mouth daily.   Turmeric (QC TUMERIC COMPLEX PO) Take 1 tablet by mouth daily.   warfarin (COUMADIN) 4 MG tablet TAKE 1 TO 2 TABLETS AS DIRECTED BY COUMADIN CLINIC   [DISCONTINUED] furosemide (LASIX) 40 MG tablet Take 1 tablet (40 mg total) by mouth once for 1 dose.   [DISCONTINUED] potassium chloride (KLOR-CON) 10 MEQ tablet Take 1 tablet (10 mEq total) by mouth daily.     Allergies:   Patient has no known allergies.   Social History   Tobacco Use   Smoking status: Former    Packs/day: 0.10    Years: 2.00    Pack years: 0.20    Types: Cigarettes    Quit date: 12/16/1974    Years since quitting: 46.1   Smokeless tobacco: Never   Tobacco comments:    "1 pack cigarettes would last me a month"  Vaping Use   Vaping Use: Never used  Substance Use Topics   Alcohol use: No   Drug use: No    Types: Cocaine, Marijuana    Comment: Hx polysubstance abuse, quit 2008     Family Hx: The patient's family history includes Bone cancer in his maternal uncle; Cancer in his maternal uncle;  Colon cancer in his sister and sister; Colon cancer (age of onset: 45) in his sister; Colon polyps in his brother, father, and mother; Dementia in his mother; Diabetes in his mother; Heart disease in his father; Heart failure in his father; Hyperlipidemia in his mother; Hypertension in his mother; Lung cancer in his paternal grandfather; Stroke in his maternal grandmother and sister. There is no history of Heart attack.  ROS:   Please see the history of present illness.     All other systems reviewed and are negative.   Prior CV studies:   The following studies were reviewed today:  None  Labs/Other Tests and Data Reviewed:    EKG:  No ECG reviewed.  Recent Labs: 01/14/2021: B Natriuretic Peptide 109.4; TSH 0.822 01/17/2021: ALT 17; BUN 31; Creatinine, Ser 1.37; Hemoglobin 11.6; Magnesium 1.2; Platelets 127; Potassium 3.8; Sodium 136   Recent Lipid Panel Lab Results  Component Value Date/Time   CHOL  11/20/2008 04:20 AM    93        ATP III CLASSIFICATION:  <200     mg/dL   Desirable  200-239  mg/dL   Borderline High  >=240    mg/dL   High          TRIG 31 11/20/2008 04:20 AM   HDL 42 11/20/2008 04:20 AM   CHOLHDL 2.2 11/20/2008 04:20 AM   LDLCALC  11/20/2008 04:20 AM    45        Total Cholesterol/HDL:CHD Risk Coronary Heart Disease Risk Table                     Men   Women  1/2 Average Risk   3.4   3.3  Average Risk       5.0   4.4  2 X Average Risk   9.6   7.1  3 X Average Risk  23.4   11.0        Use the calculated Patient Ratio above and the CHD Risk Table to determine the patient's CHD Risk.        ATP III CLASSIFICATION (LDL):  <100     mg/dL   Optimal  100-129  mg/dL   Near or Above                    Optimal  130-159  mg/dL   Borderline  160-189  mg/dL   High  >190     mg/dL   Very High    Wt Readings from Last 3 Encounters:  01/27/21 256 lb (116.1 kg)  01/17/21 278 lb 10.6 oz (126.4 kg)  08/27/20 283 lb (128.4 kg)     Risk  Assessment/Calculations:    CHA2DS2-VASc Score = 3  This indicates a 3.2% annual risk of stroke. The patient's score is based upon: CHF History: Yes HTN History: Yes Diabetes History: No Stroke History: No Vascular Disease History: No Age Score: 1 Gender Score: 0      Objective:    Vital Signs:  BP (!) 133/99   Ht 5\' 10"  (1.778 m)   Wt 256 lb (116.1 kg)   BMI 36.73 kg/m    VITAL SIGNS:  reviewed GEN:  no acute distress RESPIRATORY:  normal respiratory effort, symmetric expansion CARDIOVASCULAR:  no peripheral edema  ASSESSMENT & PLAN:    HTN - BP well controlled. Reports no recurrent hypotension. Continue Diltiazem 120mg  daily and Lasix 40mg  daily.   Chronic diastolic heart failure - Grossly euvolemic on video exam. Reports home weights are stable - encouraged daily weight. Conitnue Furosemide 40mg  QD and Potassium 6mEq daily. Refills provided. Consider BMP at follow up.   Hypokalemia - Continue Potassium 27mEq daily.   Permanent atrial fibrillation / Chronic anticoagulation Denies palpitations. Continue Warfarin. [place] for INR check. Encouraged INR check ASAP upcoming returning next week from out of town. Denies bleeding complications.   Time:   Today, I have spent 11 minutes with the patient with telehealth technology discussing the above problems.     Medication Adjustments/Labs and Tests Ordered: Current medicines are reviewed at length with the patient today.  Concerns regarding medicines are outlined above.   Tests Ordered: Orders Placed This Encounter  Procedures   Basic metabolic panel    Medication Changes: Meds ordered this encounter  Medications   furosemide (LASIX) 40 MG tablet    Sig: Take 1 tablet (40 mg total) by  mouth daily.    Dispense:  90 tablet    Refill:  1   potassium chloride (KLOR-CON) 10 MEQ tablet    Sig: Take 1 tablet (10 mEq total) by mouth daily.    Dispense:  90 tablet    Refill:  1    Follow Up:  In Person in 2  month(s)  Signed, Loel Dubonnet, NP  01/27/2021 7:27 PM    [facility]

## 2021-02-04 ENCOUNTER — Telehealth: Payer: Self-pay

## 2021-02-04 NOTE — Telephone Encounter (Signed)
Called and spoke w/pt's daughter regarding the overdue appt and was able to schedule one and they voiced understanding

## 2021-02-08 ENCOUNTER — Telehealth: Payer: Self-pay

## 2021-02-08 NOTE — Telephone Encounter (Signed)
I spoke to patient's spouse and reminded her to have patient hold Coumadin prior to his Colonoscopy. 8/7-8/11.  She verbalized understanding.  We also rescheduled his INR check for 8/10.

## 2021-02-10 ENCOUNTER — Other Ambulatory Visit: Payer: Self-pay

## 2021-02-10 ENCOUNTER — Ambulatory Visit (INDEPENDENT_AMBULATORY_CARE_PROVIDER_SITE_OTHER): Payer: Medicare HMO

## 2021-02-10 DIAGNOSIS — Z5181 Encounter for therapeutic drug level monitoring: Secondary | ICD-10-CM

## 2021-02-10 DIAGNOSIS — I4821 Permanent atrial fibrillation: Secondary | ICD-10-CM | POA: Diagnosis not present

## 2021-02-10 LAB — POCT INR: INR: 1.5 — AB (ref 2.0–3.0)

## 2021-02-10 NOTE — Patient Instructions (Signed)
Holding for Colonoscopy 8/12; 8/7-8/11;  Continue taking 2 tables daily except 1 tablet on Monday, Wednesday and Friday. INR in 1 week. Call us with medication changes or concerns [phone].

## 2021-02-12 ENCOUNTER — Encounter (HOSPITAL_COMMUNITY)
Admission: RE | Disposition: A | Discharge status: Home / Self Care | Payer: Self-pay | Source: Home / Self Care | Attending: Gastroenterology

## 2021-02-12 ENCOUNTER — Ambulatory Visit (HOSPITAL_COMMUNITY)
Admission: RE | Admit: 2021-02-12 | Discharge: 2021-02-12 | Disposition: A | Discharge status: Home / Self Care | Payer: Medicare HMO | Attending: Gastroenterology | Admitting: Gastroenterology

## 2021-02-12 ENCOUNTER — Ambulatory Visit (HOSPITAL_COMMUNITY): Payer: Medicare HMO | Admitting: Anesthesiology

## 2021-02-12 ENCOUNTER — Encounter (HOSPITAL_COMMUNITY): Payer: Self-pay | Admitting: Gastroenterology

## 2021-02-12 DIAGNOSIS — Z833 Family history of diabetes mellitus: Secondary | ICD-10-CM | POA: Insufficient documentation

## 2021-02-12 DIAGNOSIS — Z1211 Encounter for screening for malignant neoplasm of colon: Secondary | ICD-10-CM | POA: Diagnosis present

## 2021-02-12 DIAGNOSIS — Z823 Family history of stroke: Secondary | ICD-10-CM | POA: Diagnosis not present

## 2021-02-12 DIAGNOSIS — Z8349 Family history of other endocrine, nutritional and metabolic diseases: Secondary | ICD-10-CM | POA: Diagnosis not present

## 2021-02-12 DIAGNOSIS — Z8371 Family history of colonic polyps: Secondary | ICD-10-CM | POA: Insufficient documentation

## 2021-02-12 DIAGNOSIS — Z8249 Family history of ischemic heart disease and other diseases of the circulatory system: Secondary | ICD-10-CM | POA: Insufficient documentation

## 2021-02-12 DIAGNOSIS — Z85038 Personal history of other malignant neoplasm of large intestine: Secondary | ICD-10-CM | POA: Diagnosis not present

## 2021-02-12 DIAGNOSIS — I5032 Chronic diastolic (congestive) heart failure: Secondary | ICD-10-CM | POA: Insufficient documentation

## 2021-02-12 DIAGNOSIS — Z87891 Personal history of nicotine dependence: Secondary | ICD-10-CM | POA: Diagnosis not present

## 2021-02-12 DIAGNOSIS — Z801 Family history of malignant neoplasm of trachea, bronchus and lung: Secondary | ICD-10-CM | POA: Insufficient documentation

## 2021-02-12 DIAGNOSIS — Z7901 Long term (current) use of anticoagulants: Secondary | ICD-10-CM | POA: Insufficient documentation

## 2021-02-12 DIAGNOSIS — Z9049 Acquired absence of other specified parts of digestive tract: Secondary | ICD-10-CM | POA: Diagnosis not present

## 2021-02-12 DIAGNOSIS — I110 Hypertensive heart disease with heart failure: Secondary | ICD-10-CM | POA: Insufficient documentation

## 2021-02-12 DIAGNOSIS — I4821 Permanent atrial fibrillation: Secondary | ICD-10-CM | POA: Insufficient documentation

## 2021-02-12 HISTORY — PX: COLONOSCOPY WITH PROPOFOL: SHX5780

## 2021-02-12 SURGERY — COLONOSCOPY WITH PROPOFOL
Anesthesia: Monitor Anesthesia Care

## 2021-02-12 MED ORDER — PROPOFOL 500 MG/50ML IV EMUL
INTRAVENOUS | Status: DC | PRN
  Administered 2021-02-12: 120 ug/kg/min via INTRAVENOUS

## 2021-02-12 MED ORDER — LACTATED RINGERS IV SOLN: INTRAVENOUS | Status: DC

## 2021-02-12 MED ORDER — SODIUM CHLORIDE 0.9 % IV SOLN: INTRAVENOUS | Status: DC

## 2021-02-12 MED ORDER — PROPOFOL 10 MG/ML IV BOLUS
INTRAVENOUS | Status: DC | PRN
  Administered 2021-02-12: 40 mg via INTRAVENOUS

## 2021-02-12 MED ORDER — LIDOCAINE 2% (20 MG/ML) 5 ML SYRINGE
INTRAMUSCULAR | Status: DC | PRN
  Administered 2021-02-12: 100 mg via INTRAVENOUS

## 2021-02-12 SURGICAL SUPPLY — 21 items

## 2021-02-12 NOTE — Op Note (Signed)
[facility] Patient Name: Steven Barnett Procedure Date: 02/12/2021 MRN: [id number] Attending MD: Carol Ada , MD Date of Birth: May 28, 1951 CSN: [id number] Age: 70 Admit Type: Outpatient Procedure:                Colonoscopy Indications:              High risk colon cancer surveillance: Personal                            history of colon cancer Providers:                Carol Ada, MD, Burtis Junes, RN, Elspeth Cho                            Tech., Technician, Barrett Henle, CRNA Referring MD:              Medicines:                Propofol per Anesthesia Complications:            No immediate complications. Estimated Blood Loss:     Estimated blood loss: none. Procedure:                Pre-Anesthesia Assessment:                           - Prior to the procedure, a History and Physical                            was performed, and patient medications and                            allergies were reviewed. The patient's tolerance of                            previous anesthesia was also reviewed. The risks                            and benefits of the procedure and the sedation                            options and risks were discussed with the patient.                            All questions were answered, and informed consent                            was obtained. Prior Anticoagulants: The patient has                            taken Coumadin (warfarin), last dose was 5 days                            prior to procedure. ASA Grade Assessment: III - A  patient with severe systemic disease. After                            reviewing the risks and benefits, the patient was                            deemed in satisfactory condition to undergo the                            procedure.                           - Sedation was administered by an anesthesia                            professional. Deep sedation was attained.                            After obtaining informed consent, the colonoscope                            was passed under direct vision. Throughout the                            procedure, the patient's blood pressure, pulse, and                            oxygen saturations were monitored continuously. The                            CF-[id number] ([id number] Olympus colonoscope was                            introduced through the anus and advanced to the the                            ileocolonic anastomosis. The colonoscopy was                            performed without difficulty. The patient tolerated                            the procedure well. The quality of the bowel                            preparation was good. The terminal ileum was                            photographed. Scope In: 10:30:07 AM Scope Out: 10:47:26 AM Scope Withdrawal Time: 0 hours 13 minutes 1 second  Total Procedure Duration: 0 hours 17 minutes 19 seconds  Findings:      The colon (entire examined portion) appeared normal. Impression:               - The entire examined colon is normal.                           -  No specimens collected. Moderate Sedation:      Not Applicable - Patient had care per Anesthesia. Recommendation:           - Patient has a contact number available for                            emergencies. The signs and symptoms of potential                            delayed complications were discussed with the                            patient. Return to normal activities tomorrow.                            Written discharge instructions were provided to the                            patient.                           - Resume previous diet.                           - Continue present medications.                           - Repeat colonoscopy in 5 years for surveillance.                           - Resume coumadin. Procedure Code(s):        --- Professional ---                           [phone],  Colonoscopy, flexible; diagnostic, including                            collection of specimen(s) by brushing or washing,                            when performed (separate procedure) Diagnosis Code(s):        --- Professional ---                           K53.976, Personal history of other malignant                            neoplasm of large intestine CPT copyright 2019 American Medical Association. All rights reserved. The codes documented in this report are preliminary and upon coder review may  be revised to meet current compliance requirements. Carol Ada, MD Carol Ada, MD 02/12/2021 11:00:56 AM This report has been signed electronically. Number of Addenda: 0

## 2021-02-12 NOTE — Discharge Instructions (Signed)
YOU HAD AN ENDOSCOPIC PROCEDURE TODAY: Refer to the procedure report and other information in the discharge instructions given to you for any specific questions about what was found during the examination. If this information does not answer your questions, please call [facility] at [phone] to clarify.  ° °YOU SHOULD EXPECT: Some feelings of bloating in the abdomen. Passage of more gas than usual. Walking can help get rid of the air that was put into your GI tract during the procedure and reduce the bloating. If you had a lower endoscopy (such as a colonoscopy or flexible sigmoidoscopy) you may notice spotting of blood in your stool or on the toilet paper. Some abdominal soreness may be present for a day or two, also. ° °DIET: Your first meal following the procedure should be a light meal and then it is ok to progress to your normal diet. A half-sandwich or bowl of soup is an example of a good first meal. Heavy or fried foods are harder to digest and may make you feel nauseous or bloated. Drink plenty of fluids but you should avoid alcoholic beverages for 24 hours. If you had an esophageal dilation, please see attached information for diet.  ° °ACTIVITY: Your care partner should take you home directly after the procedure. You should plan to take it easy, moving slowly for the rest of the day. You can resume normal activity the day after the procedure however YOU SHOULD NOT DRIVE, use power tools, machinery or perform tasks that involve climbing or major physical exertion for 24 hours (because of the sedation medicines used during the test).  ° °SYMPTOMS TO REPORT IMMEDIATELY: °A gastroenterologist can be reached at any hour. Please call [phone]  for any of the following symptoms:  °Following lower endoscopy (colonoscopy, flexible sigmoidoscopy) °Excessive amounts of blood in the stool  °Significant tenderness, worsening of abdominal pains  °Swelling of the abdomen that is new, acute  °Fever of  100° or higher  °Black, tarry-looking or red, bloody stools ° °FOLLOW UP:  °If any biopsies were taken you will be contacted by phone or by letter within the next 1-3 weeks. Call [phone]  if you have not heard about the biopsies in 3 weeks.  °Please also call with any specific questions about appointments or follow up tests. ° °

## 2021-02-12 NOTE — Anesthesia Procedure Notes (Signed)
Procedure Name: MAC Date/Time: 02/12/2021 10:24 AM Performed by: West Pugh, CRNA Pre-anesthesia Checklist: Patient identified, Emergency Drugs available, Suction available, Patient being monitored and Timeout performed Patient Re-evaluated:Patient Re-evaluated prior to induction Oxygen Delivery Method: Simple face mask Preoxygenation: Pre-oxygenation with 100% oxygen Induction Type: IV induction Placement Confirmation: positive ETCO2 Dental Injury: Teeth and Oropharynx as per pre-operative assessment

## 2021-02-12 NOTE — Transfer of Care (Signed)
Immediate Anesthesia Transfer of Care Note  Patient: Steven Barnett  Procedure(s) Performed: COLONOSCOPY WITH PROPOFOL  Patient Location: PACU and Endoscopy Unit  Anesthesia Type:MAC  Level of Consciousness: drowsy and patient cooperative  Airway & Oxygen Therapy: Patient Spontanous Breathing and Patient connected to face mask oxygen  Post-op Assessment: Report given to RN and Post -op Vital signs reviewed and stable  Post vital signs: Reviewed and stable  Last Vitals:  Vitals Value Taken Time  BP    Temp    Pulse 97 02/12/21 1055  Resp 18 02/12/21 1055  SpO2 100 % 02/12/21 1055  Vitals shown include unvalidated device data.  Last Pain:  Vitals:   02/12/21 1009  TempSrc: Oral  PainSc: 0-No pain         Complications: No notable events documented.

## 2021-02-12 NOTE — Anesthesia Preprocedure Evaluation (Addendum)
Anesthesia Evaluation  Patient identified by MRN, date of birth, ID band Patient awake    Reviewed: Allergy & Precautions, NPO status , Patient's Chart, lab work & pertinent test results  Airway Mallampati: III  TM Distance: >3 FB Neck ROM: Full    Dental no notable dental hx. (+) Poor Dentition, Chipped, Missing, Dental Advisory Given   Pulmonary neg pulmonary ROS, sleep apnea , former smoker,    Pulmonary exam normal        Cardiovascular hypertension, Pt. on medications +CHF  Normal cardiovascular exam+ dysrhythmias Atrial Fibrillation  Rhythm:Regular Rate:Normal  Echo 09/08/27 - Systolic function was   normal. The estimated ejection fraction was in the range of 55%   to 60%. Wall motion was normal; there were no regional wall   motion abnormalities.    Neuro/Psych negative neurological ROS  negative psych ROS   GI/Hepatic negative GI ROS, Neg liver ROS,   Endo/Other  Morbid obesity  Renal/GU negative Renal ROS  negative genitourinary   Musculoskeletal  (+) Arthritis ,   Abdominal (+) + obese,   Peds negative pediatric ROS (+)  Hematology  (+) Blood dyscrasia, anemia ,   Anesthesia Other Findings   Reproductive/Obstetrics negative OB ROS                            Lab Results  Component Value Date   WBC 6.8 01/17/2021   HGB 11.6 (L) 01/17/2021   HCT 35.4 (L) 01/17/2021   MCV 83.9 01/17/2021   PLT 127 (L) 01/17/2021    Anesthesia Physical  Anesthesia Plan  ASA: 3  Anesthesia Plan: MAC   Post-op Pain Management:    Induction:   PONV Risk Score and Plan: 1  Airway Management Planned: Mask, Natural Airway and Nasal Cannula  Additional Equipment: None  Intra-op Plan:   Post-operative Plan:   Informed Consent: I have reviewed the patients History and Physical, chart, labs and discussed the procedure including the risks, benefits and alternatives for the proposed  anesthesia with the patient or authorized representative who has indicated his/her understanding and acceptance.       Plan Discussed with: CRNA and Anesthesiologist  Anesthesia Plan Comments:         Anesthesia Quick Evaluation

## 2021-02-12 NOTE — H&P (Signed)
Steven Barnett HPI: At this time the patient denies any problems with nausea, vomiting, fevers, chills, abdominal pain, diarrhea, constipation, hematochezia, melena, GERD, or dysphagia. The patient denies any known family history of colon cancers. No complaints of chest pain, SOB, MI, or sleep apnea.  His colonoscopy on 09/15/2017 was positive for an 18 mm TA in the sigmoid colon.  His atrial fibrillation is controlled and he remains on coumadin.  Dr. Debara Barnett manages his afib.  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  Past Medical History:  Diagnosis Date   Anemia    Arthralgia of both knees    Arthritis    "right knee" (10/01/2015)   Atrial fibrillation ([place])    Cancer ([place])    STAGE 1 COLON CANCER   Chronic anticoagulation 2010   Coumadin   Chronic diastolic CHF (congestive heart failure), NYHA class 2 ([place])    Dyspnea    Dysrhythmia    Hypertension    OSA (obstructive sleep apnea) 2016   "couldn't take the mask during the testing" (10/01/2015)   Permanent atrial fibrillation ([place]) 2010   Pneumonia 3/29/2017and june 2017    Past Surgical History:  Procedure Laterality Date   COLONOSCOPY WITH PROPOFOL N/A 03/04/2016   Procedure: COLONOSCOPY WITH PROPOFOL;  Surgeon: Steven Ada, MD;  Location: [facility];  Service: Endoscopy;  Laterality: N/A;   COLONOSCOPY WITH PROPOFOL N/A 09/15/2017   Procedure: COLONOSCOPY WITH PROPOFOL;  Surgeon: Steven Ada, MD;  Location: [facility];  Service: Endoscopy;  Laterality: N/A;   LAPAROSCOPIC PARTIAL COLECTOMY N/A 04/12/2016   Procedure: LAPAROSCOPIC ILEOCOLECTOMY;  Surgeon: Steven Klein, MD;  Location: [facility];  Service: General;  Laterality: N/A;   PORT-A-CATH REMOVAL N/A 02/16/2017   Procedure: REMOVAL PORT-A-CATH;  Surgeon: Steven Klein, MD;  Location: [facility];  Service: General;  Laterality: N/A;   PORTACATH PLACEMENT N/A 05/18/2016   Procedure: INSERTION PORT-A-CATH;  Surgeon: Steven Klein, MD;   Location: [facility];  Service: General;  Laterality: N/A;   THORACOTOMY Right 2010    Family History  Problem Relation Age of Onset   Heart disease Father    Heart failure Father    Colon polyps Father        unspecified number - pt of intestine surgically resected   Hypertension Mother    Diabetes Mother    Hyperlipidemia Mother    Colon polyps Mother        unspecified number - pt of intestine surgically resected   Dementia Mother        d. 101   Stroke Maternal Grandmother    Stroke Sister    Colon cancer Sister 58       w/ "cancerous polyps" - unspecified number; underwent surgery and radiation   Colon polyps Brother        unspecified number - polypectomies   Colon cancer Sister        dx 91-60; s/p surgery and radiation   Bone cancer Maternal Uncle        dx. 42s   Lung cancer Paternal Grandfather        d. 45s-90s; lung cancer vs TB   Colon cancer Sister        dx. 61-60; s/p surgery and radiation   Cancer Maternal Uncle        mother had about 7-8 other siblings, most passed at older ages, many of whom had some type of cancer   Heart attack Neg  Hx     Social History:  reports that he quit smoking about 46 years ago. His smoking use included cigarettes. He has a 0.20 pack-year smoking history. He has never used smokeless tobacco. He reports that he does not drink alcohol and does not use drugs.  Allergies: No Known Allergies  Medications: Scheduled: Continuous:  sodium chloride     lactated ringers 20 mL/hr at 02/12/21 1012    No results found for this or any previous visit (from the past 24 hour(s)).   No results found.  ROS:  As stated above in the HPI otherwise negative.  There were no vitals taken for this visit.    PE: Gen: NAD, Alert and Oriented HEENT:  [place]/AT, EOMI Neck: Supple, no LAD Lungs: CTA Bilaterally CV: RRR without M/G/R ABD: Soft, NTND, +BS Ext: No C/C/E  Assessment/Plan: 1) Personal history of colon cancer -  colonoscopy.  Steven Barnett D 02/12/2021, 8:53 AM

## 2021-02-12 NOTE — Anesthesia Postprocedure Evaluation (Signed)
Anesthesia Post Note  Patient: Steven Barnett  Procedure(s) Performed: COLONOSCOPY WITH PROPOFOL     Patient location during evaluation: PACU Anesthesia Type: MAC Level of consciousness: awake and alert Pain management: pain level controlled Vital Signs Assessment: post-procedure vital signs reviewed and stable Respiratory status: spontaneous breathing, nonlabored ventilation, respiratory function stable and patient connected to nasal cannula oxygen Cardiovascular status: stable and blood pressure returned to baseline Postop Assessment: no apparent nausea or vomiting Anesthetic complications: no   No notable events documented.  Last Vitals:  Vitals:   02/12/21 1110 02/12/21 1124  BP: 129/83 (!) 142/99  Pulse: 81 98  Resp: 18 (!) 24  Temp:    SpO2: 95% 99%    Last Pain:  Vitals:   02/12/21 1124  TempSrc:   PainSc: 0-No pain                 Lashonda Sonneborn

## 2021-02-16 ENCOUNTER — Encounter (HOSPITAL_COMMUNITY): Payer: Self-pay | Admitting: Gastroenterology

## 2021-02-19 ENCOUNTER — Telehealth: Payer: Self-pay

## 2021-02-19 NOTE — Telephone Encounter (Signed)
Unable to lmom as vm was full

## 2021-03-05 ENCOUNTER — Ambulatory Visit (INDEPENDENT_AMBULATORY_CARE_PROVIDER_SITE_OTHER): Payer: Medicare HMO | Admitting: Pharmacist

## 2021-03-05 ENCOUNTER — Other Ambulatory Visit: Payer: Self-pay

## 2021-03-05 DIAGNOSIS — I4821 Permanent atrial fibrillation: Secondary | ICD-10-CM | POA: Diagnosis not present

## 2021-03-05 DIAGNOSIS — Z5181 Encounter for therapeutic drug level monitoring: Secondary | ICD-10-CM

## 2021-03-05 LAB — POCT INR: INR: 2.2 (ref 2.0–3.0)

## 2021-03-05 NOTE — Patient Instructions (Signed)
Description   Continue taking 2 tables daily except 1 tablet on Monday, Wednesday and Friday. INR in 3 week. Call us with medication changes or concerns [phone].

## 2021-03-10 ENCOUNTER — Other Ambulatory Visit: Payer: Self-pay | Admitting: Internal Medicine

## 2021-03-29 ENCOUNTER — Telehealth: Payer: Self-pay

## 2021-03-29 ENCOUNTER — Encounter: Payer: Medicare HMO | Admitting: Physician Assistant

## 2021-03-29 NOTE — Progress Notes (Signed)
This encounter was created in error - please disregard.

## 2021-03-29 NOTE — Telephone Encounter (Signed)
UNABLE TO LMOM FOR MISSED APPT

## 2021-04-01 ENCOUNTER — Other Ambulatory Visit: Payer: Medicare HMO

## 2021-04-01 ENCOUNTER — Telehealth: Payer: Self-pay | Admitting: Hematology

## 2021-04-01 ENCOUNTER — Ambulatory Visit: Payer: Medicare HMO | Admitting: Hematology

## 2021-04-01 NOTE — Telephone Encounter (Signed)
R/s todays appt per sch msg ( patient request). Called, not able to leave msg.

## 2021-04-09 ENCOUNTER — Other Ambulatory Visit: Payer: Self-pay

## 2021-04-09 ENCOUNTER — Ambulatory Visit (INDEPENDENT_AMBULATORY_CARE_PROVIDER_SITE_OTHER): Payer: Medicare HMO

## 2021-04-09 DIAGNOSIS — Z5181 Encounter for therapeutic drug level monitoring: Secondary | ICD-10-CM | POA: Diagnosis not present

## 2021-04-09 DIAGNOSIS — I4821 Permanent atrial fibrillation: Secondary | ICD-10-CM

## 2021-04-09 LAB — POCT INR: INR: 1.5 — AB (ref 2.0–3.0)

## 2021-04-09 NOTE — Patient Instructions (Signed)
Took 2 tablets this morning, because missed Thursday dose and will take 1 more tonight and then 3 tablets on Saturday and then  Continue taking 2 tables daily except 1 tablet on Monday, Wednesday and Friday. INR in 2 weeks. Call us with medication changes or concerns [phone].

## 2021-04-14 ENCOUNTER — Other Ambulatory Visit: Payer: Self-pay

## 2021-04-14 ENCOUNTER — Encounter (HOSPITAL_BASED_OUTPATIENT_CLINIC_OR_DEPARTMENT_OTHER): Payer: Self-pay

## 2021-04-14 ENCOUNTER — Emergency Department (HOSPITAL_BASED_OUTPATIENT_CLINIC_OR_DEPARTMENT_OTHER): Payer: Medicare HMO

## 2021-04-14 ENCOUNTER — Emergency Department (HOSPITAL_COMMUNITY): Payer: Medicare HMO

## 2021-04-14 ENCOUNTER — Emergency Department (HOSPITAL_BASED_OUTPATIENT_CLINIC_OR_DEPARTMENT_OTHER)
Admission: EM | Admit: 2021-04-14 | Discharge: 2021-04-14 | Disposition: A | Discharge status: Home / Self Care | Payer: Medicare HMO | Attending: Emergency Medicine | Admitting: Emergency Medicine

## 2021-04-14 DIAGNOSIS — I480 Paroxysmal atrial fibrillation: Secondary | ICD-10-CM

## 2021-04-14 DIAGNOSIS — M5431 Sciatica, right side: Secondary | ICD-10-CM

## 2021-04-14 DIAGNOSIS — M5441 Lumbago with sciatica, right side: Secondary | ICD-10-CM | POA: Diagnosis not present

## 2021-04-14 DIAGNOSIS — M545 Low back pain, unspecified: Secondary | ICD-10-CM | POA: Diagnosis present

## 2021-04-14 DIAGNOSIS — I5033 Acute on chronic diastolic (congestive) heart failure: Secondary | ICD-10-CM | POA: Insufficient documentation

## 2021-04-14 DIAGNOSIS — M5136 Other intervertebral disc degeneration, lumbar region: Secondary | ICD-10-CM

## 2021-04-14 DIAGNOSIS — M19071 Primary osteoarthritis, right ankle and foot: Secondary | ICD-10-CM | POA: Insufficient documentation

## 2021-04-14 DIAGNOSIS — Z7982 Long term (current) use of aspirin: Secondary | ICD-10-CM | POA: Diagnosis not present

## 2021-04-14 DIAGNOSIS — Z85038 Personal history of other malignant neoplasm of large intestine: Secondary | ICD-10-CM | POA: Diagnosis not present

## 2021-04-14 DIAGNOSIS — I4891 Unspecified atrial fibrillation: Secondary | ICD-10-CM | POA: Diagnosis not present

## 2021-04-14 DIAGNOSIS — I509 Heart failure, unspecified: Secondary | ICD-10-CM | POA: Insufficient documentation

## 2021-04-14 DIAGNOSIS — M109 Gout, unspecified: Secondary | ICD-10-CM

## 2021-04-14 DIAGNOSIS — Z87891 Personal history of nicotine dependence: Secondary | ICD-10-CM | POA: Insufficient documentation

## 2021-04-14 DIAGNOSIS — M25571 Pain in right ankle and joints of right foot: Secondary | ICD-10-CM

## 2021-04-14 LAB — CBC WITH DIFFERENTIAL/PLATELET
Abs Immature Granulocytes: 0.12 10*3/uL — ABNORMAL HIGH (ref 0.00–0.07)
Basophils Absolute: 0 10*3/uL (ref 0.0–0.1)
Basophils Relative: 0 %
Eosinophils Absolute: 0 10*3/uL (ref 0.0–0.5)
Eosinophils Relative: 0 %
HCT: 41.9 % (ref 39.0–52.0)
Hemoglobin: 13.6 g/dL (ref 13.0–17.0)
Immature Granulocytes: 1 %
Lymphocytes Relative: 3 %
Lymphs Abs: 0.5 10*3/uL — ABNORMAL LOW (ref 0.7–4.0)
MCH: 27 pg (ref 26.0–34.0)
MCHC: 32.5 g/dL (ref 30.0–36.0)
MCV: 83.1 fL (ref 80.0–100.0)
Monocytes Absolute: 1.2 10*3/uL — ABNORMAL HIGH (ref 0.1–1.0)
Monocytes Relative: 8 %
Neutro Abs: 13.3 10*3/uL — ABNORMAL HIGH (ref 1.7–7.7)
Neutrophils Relative %: 88 %
Platelets: 172 10*3/uL (ref 150–400)
RBC: 5.04 MIL/uL (ref 4.22–5.81)
RDW: 15.2 % (ref 11.5–15.5)
WBC: 15.2 10*3/uL — ABNORMAL HIGH (ref 4.0–10.5)
nRBC: 0 % (ref 0.0–0.2)

## 2021-04-14 LAB — SYNOVIAL CELL COUNT + DIFF, W/ CRYSTALS
Eosinophils-Synovial: 0 % (ref 0–1)
Lymphocytes-Synovial Fld: 2 % (ref 0–20)
Monocyte-Macrophage-Synovial Fluid: 2 % — ABNORMAL LOW (ref 50–90)
Neutrophil, Synovial: 96 % — ABNORMAL HIGH (ref 0–25)
WBC, Synovial: 22750 /mm3 — ABNORMAL HIGH (ref 0–200)

## 2021-04-14 LAB — BASIC METABOLIC PANEL
Anion gap: 9 (ref 5–15)
BUN: 15 mg/dL (ref 8–23)
CO2: 28 mmol/L (ref 22–32)
Calcium: 8.9 mg/dL (ref 8.9–10.3)
Chloride: 98 mmol/L (ref 98–111)
Creatinine, Ser: 1.17 mg/dL (ref 0.61–1.24)
GFR, Estimated: >60 mL/min (ref >60)
Glucose, Bld: 87 mg/dL (ref 70–99)
Potassium: 4.2 mmol/L (ref 3.5–5.1)
Sodium: 135 mmol/L (ref 135–145)

## 2021-04-14 LAB — PROTIME-INR
INR: 2.4 — ABNORMAL HIGH (ref 0.8–1.2)
Prothrombin Time: 25.8 seconds — ABNORMAL HIGH (ref 11.4–15.2)

## 2021-04-14 LAB — C-REACTIVE PROTEIN: CRP: 22.5 mg/dL — ABNORMAL HIGH (ref <1.0)

## 2021-04-14 LAB — SEDIMENTATION RATE: Sed Rate: 31 mm/hr — ABNORMAL HIGH (ref 0–16)

## 2021-04-14 IMAGING — CT CT L SPINE W/ CM
3 of 5 series · 10 of 33 positions shown, 12 images · IV contrast (omnipaque)
Comparison: Lumbar spine MRI [DATE], CT lumbar spine [DATE]

CLINICAL DATA: Low back pain shooting down legs and into feet

EXAM:
CT LUMBAR SPINE WITH CONTRAST
TECHNIQUE: Multidetector CT imaging of the lumbar spine was performed with
intravenous contrast administration.
CONTRAST:  100mL OMNIPAQUE IOHEXOL 300 MG/ML  SOLN

[Series 4: l spine soft · axial · 0.49mm/px · z∈[-227,-135]mm · 2 of 140 slices shown, 3 images]
[im 47/140  soft-tissue]
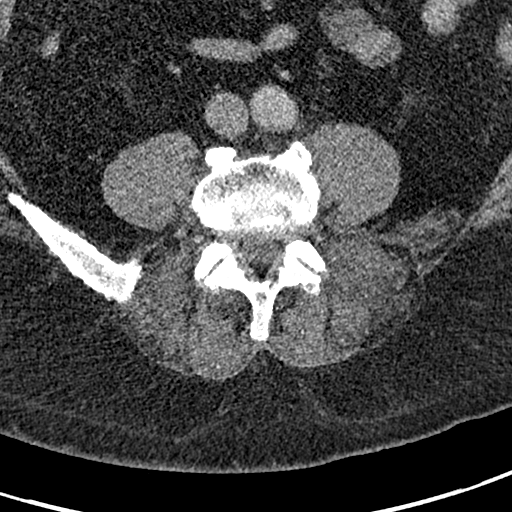
[im 47/140  bone]
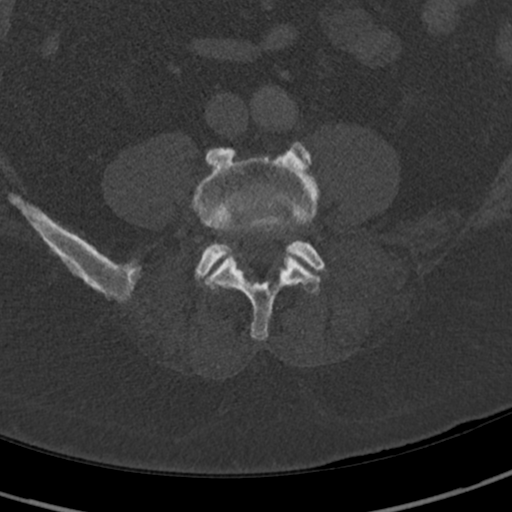
[im 93/140  bone]
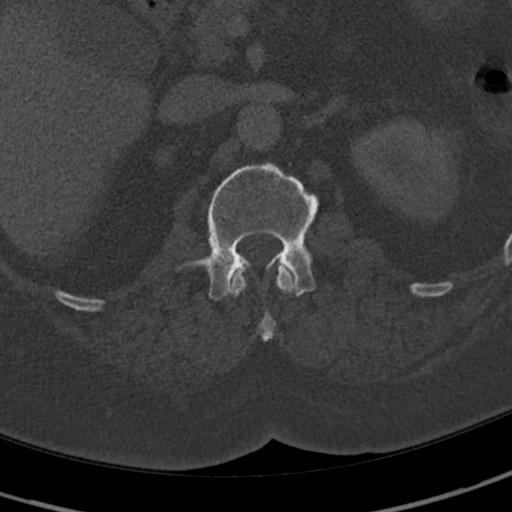

[Series 8: sagittal soft · sagittal · 0.34mm/px · 5 of 84 slices shown, 6 images]
[im 28/84  bone]
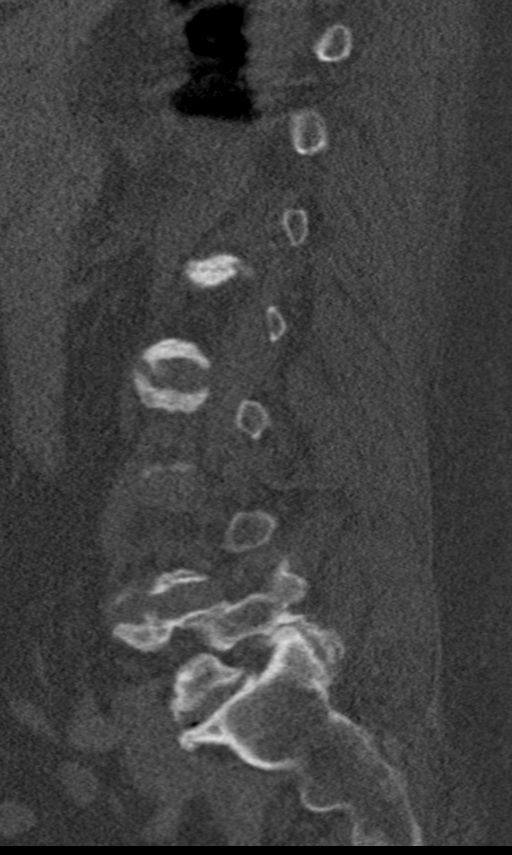
[im 35/84  bone]
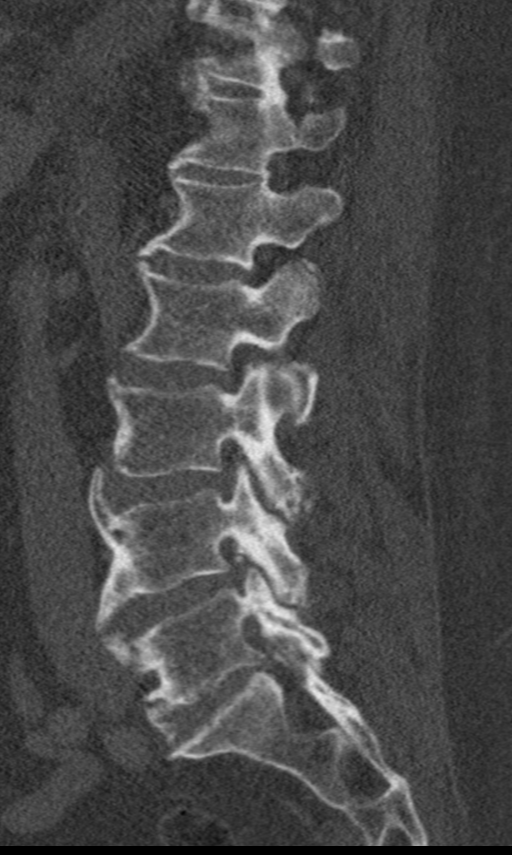
[im 42/84  soft-tissue]
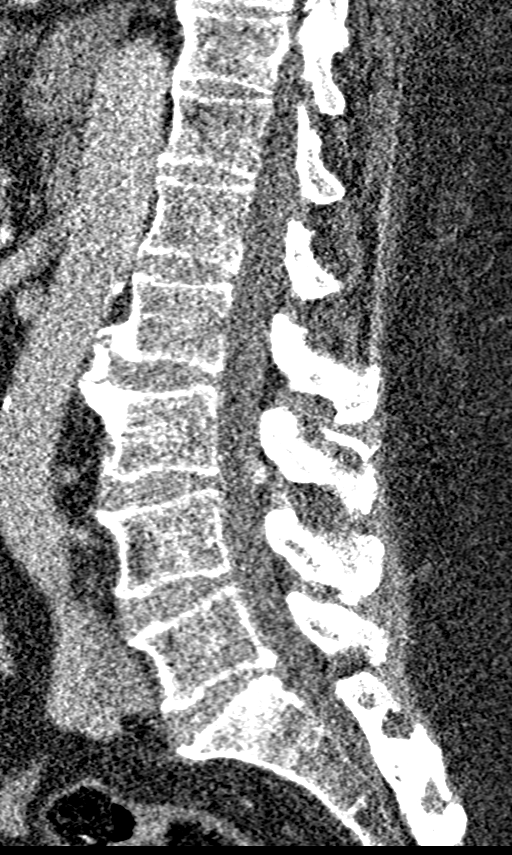
[im 42/84  bone]
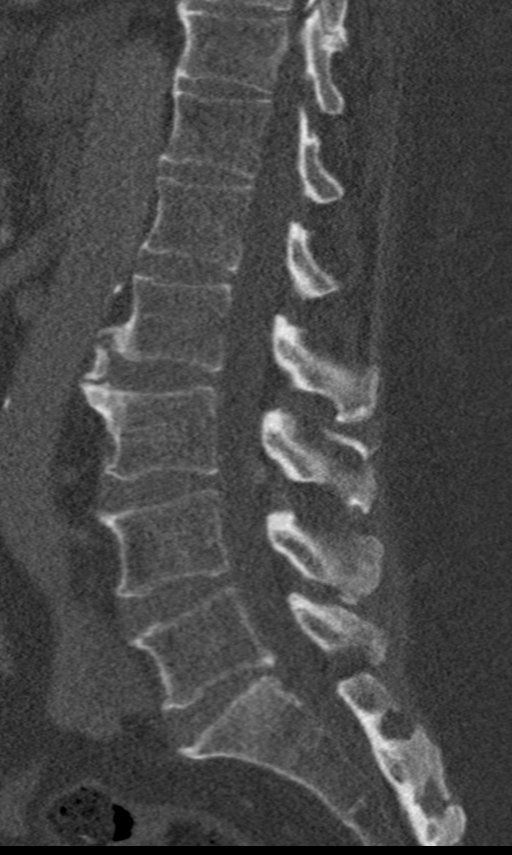
[im 49/84  bone]
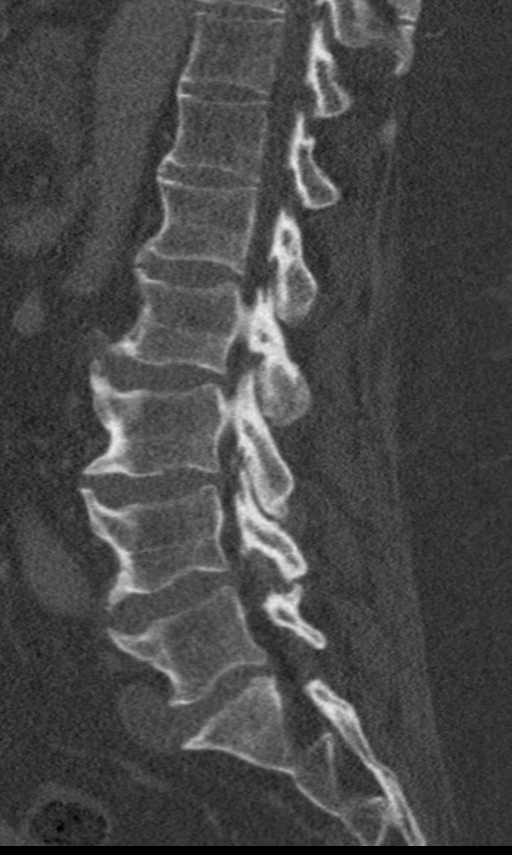
[im 56/84  bone]
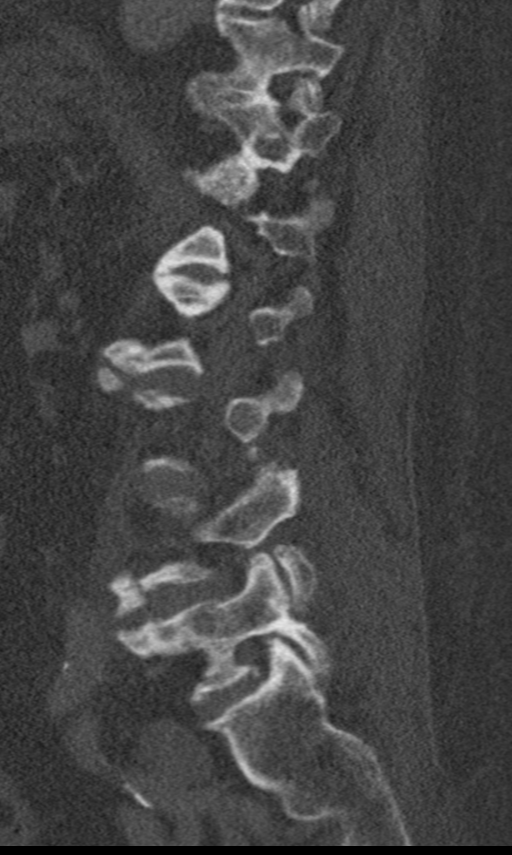

[Series 11: coronal soft · coronal · 0.31mm/px · 3 of 71 slices shown]
[im 15/71  bone]
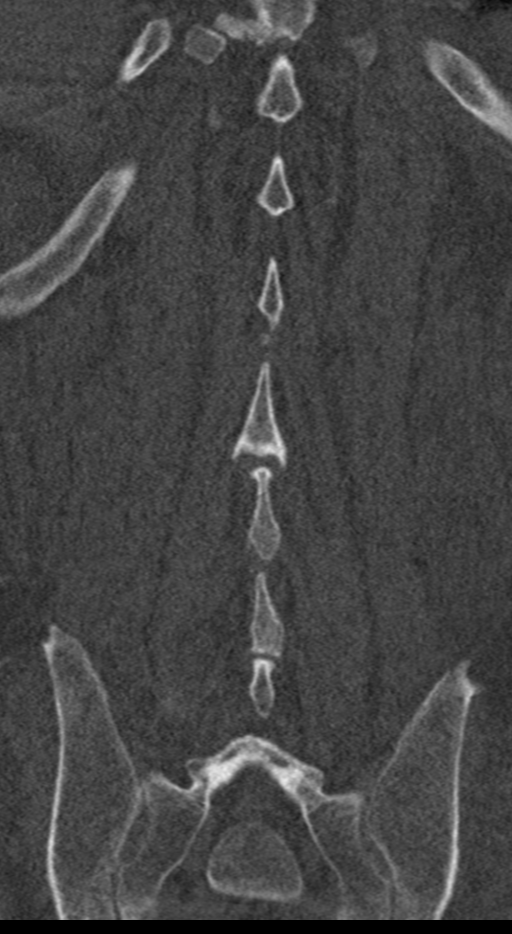
[im 29/71  bone]
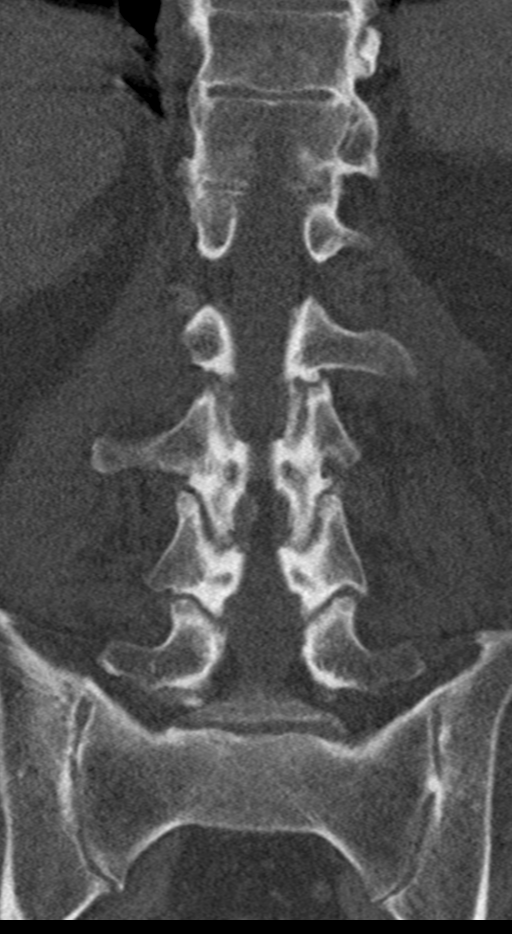
[im 43/71  bone]
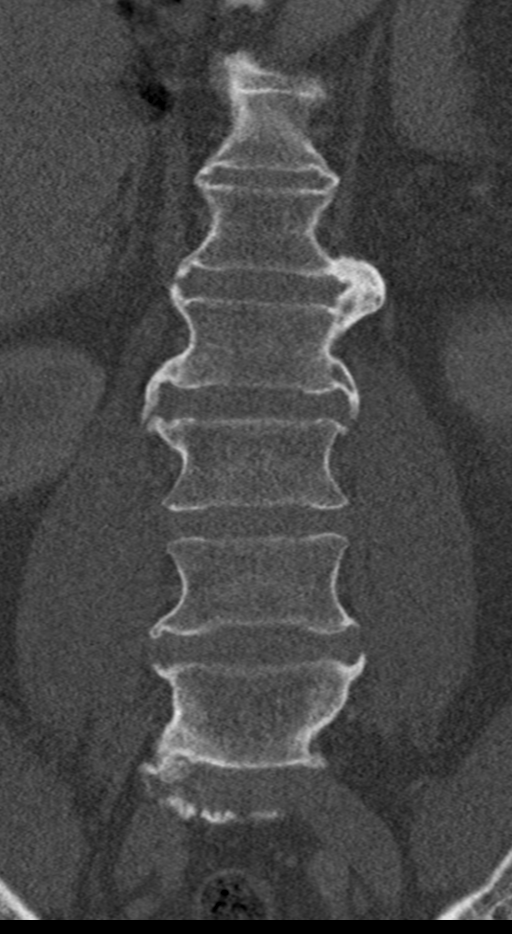

[10 of 33 positions shown; findings below may reference images not displayed]

FINDINGS: Segmentation: There are 5 non-rib-bearing lumbar type vertebral
bodies. The lowest formed disc space is designated L5-S1.

Alignment: There is trace retrolisthesis of L5 on S1, unchanged.
Alignment is otherwise normal.

Vertebrae: Vertebral body heights are preserved. There is no
suspicious osseous lesion. There is no destructive change to suggest
infection. There are bulky anterior osteophytes throughout the
lumbar spine, similar to the prior study. There are flowing anterior
osteophytes in the lower thoracic spine, unchanged.

Paraspinal and other soft tissues: The paraspinal soft tissues are
unremarkable. Dystrophic calcifications in the right gluteal fascia
are unchanged. There is atelectasis in the right lung base.

Disc levels: There is mild multilevel intervertebral disc space
narrowing and multilevel facet arthropathy.

T12-L1: No significant spinal canal or neural foraminal stenosis.

L1-L2: No significant spinal canal or neural foraminal stenosis.

L2-L3: There is mild bilateral facet arthropathy without significant
spinal canal or neural foraminal stenosis.

L3-L4: Again seen is soft tissue density adjacent to the right facet
joint corresponding to the synovial cyst seen on prior MRI. The
lesion may be slightly decreased in size compared to the prior CT,
though there is persistent partial effacement of the right
subarticular zone. There is stable degenerative endplate change and
facet arthropathy resulting in mild spinal canal stenosis and mild
bilateral neural foraminal stenosis

L4-L5: There is a mild disc bulge, degenerative endplate change, and
bilateral facet arthropathy resulting in mild spinal canal stenosis
and moderate bilateral neural foraminal stenosis, unchanged.

L5-S1: There is a mild disc bulge, degenerative endplate change, and
bilateral facet arthropathy resulting in moderate bilateral neural
foraminal stenosis, right worse than left, not significantly
changed.
IMPRESSION: 1. Again seen is a soft tissue density lesion adjacent to the right
L3-L4 facet joint consistent with the synovial cyst seen on prior
MRI. The cyst appears slightly decreased in size when directly
compared to the prior CT from [DATE], though there is persistent
partial effacement of the right subarticular zone. This could be
better characterized by MRI as indicated.
2. Otherwise, multilevel degenerative changes throughout the
remainder of the lumbar spine are not significantly changed,
resulting in up to moderate bilateral neural foraminal stenosis at
L4-L5 and L5-S1.

## 2021-04-14 IMAGING — MR MR LUMBAR SPINE W/O CM
4 of 5 series · 18 of 48 positions shown · non-contrast
Comparison: MRI [DATE]

CLINICAL DATA: Right-sided low back pain with radiculopathy

EXAM:
MRI LUMBAR SPINE WITHOUT CONTRAST
TECHNIQUE: Multiplanar, multisequence MR imaging of the lumbar spine was
performed. No intravenous contrast was administered.

[Series 3: T2 · sagittal · 4.0mm · 0.55mm/px · 6 of 15 slices shown (1 of 2)]
[im 1/15]
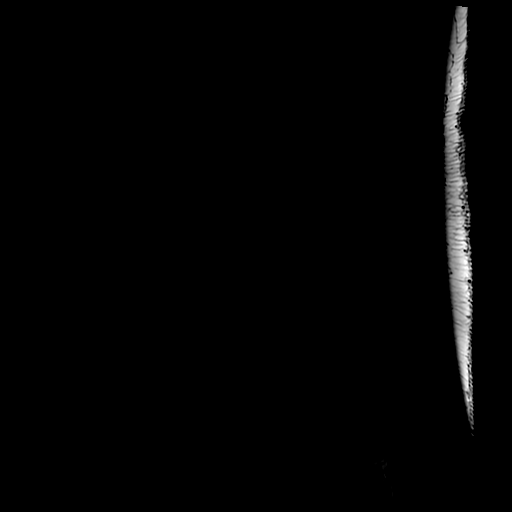
[im 3/15]
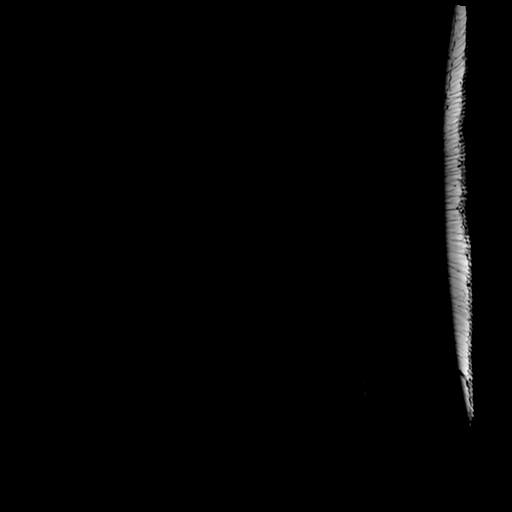
[im 6/15]
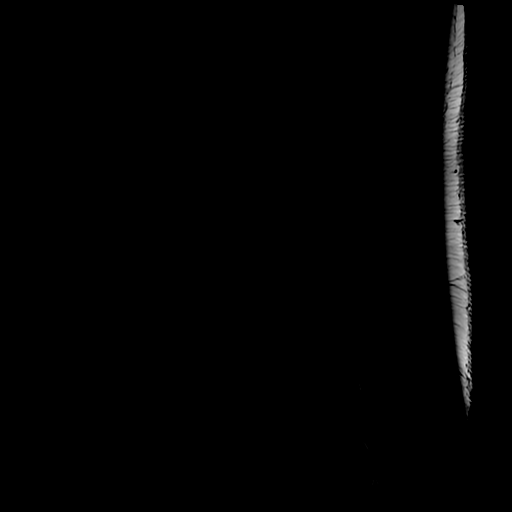
[im 9/15]
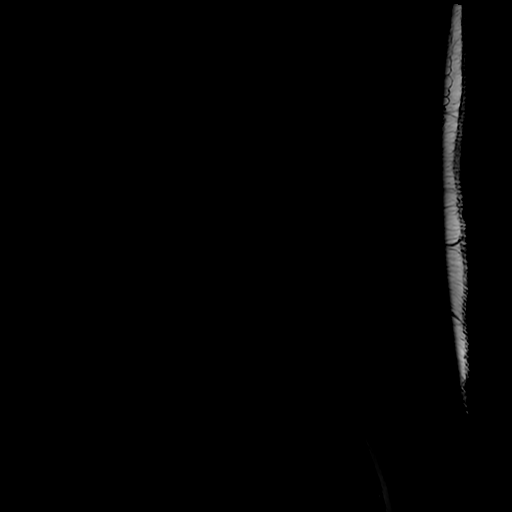
[im 12/15]
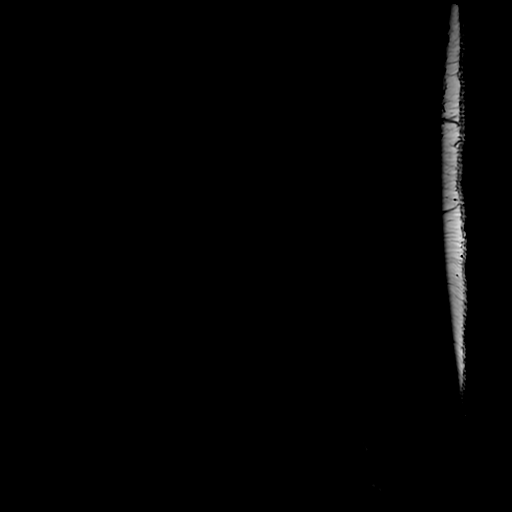
[im 15/15]
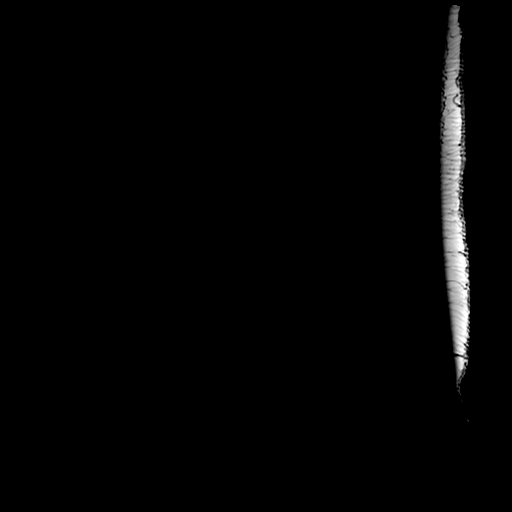

[Series 5: T1 · sagittal · 4.0mm · 0.55mm/px · 3 of 15 slices shown (1 of 2)]
[im 3/15]
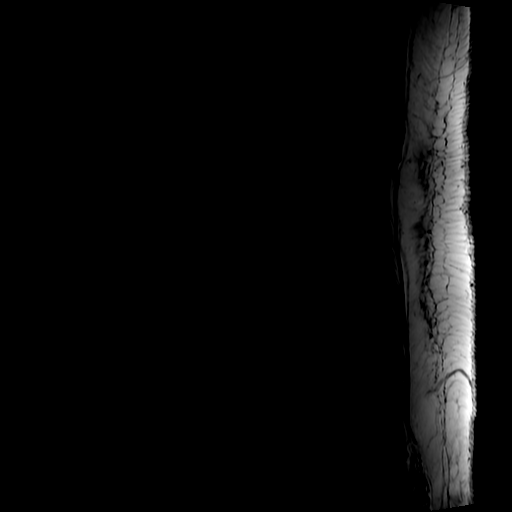
[im 9/15]
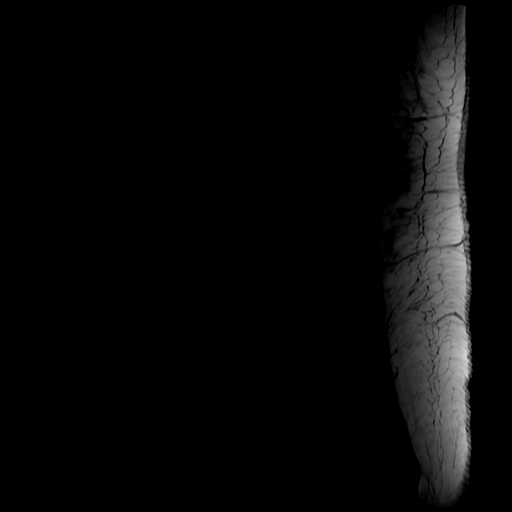
[im 15/15]
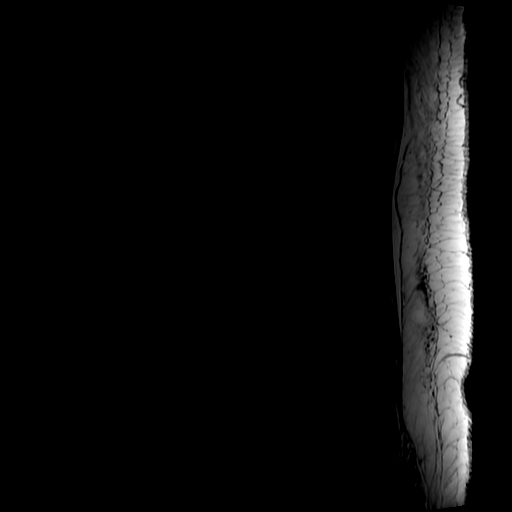

[Series 6: T2 · axial · 4.0mm · 0.39mm/px · z∈[-88,+86]mm · 6 of 35 slices shown (2 of 2)]
[im 1/35]
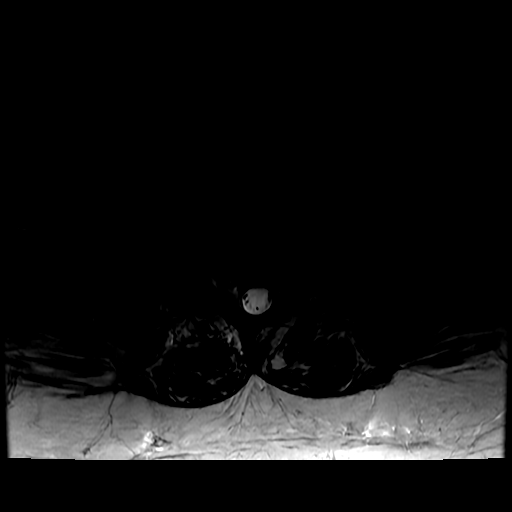
[im 5/35]
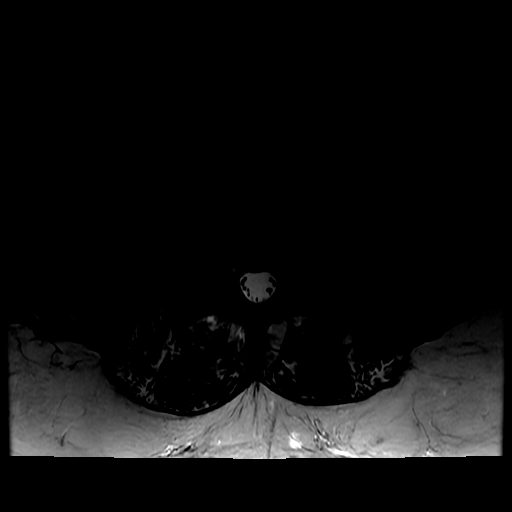
[im 10/35]
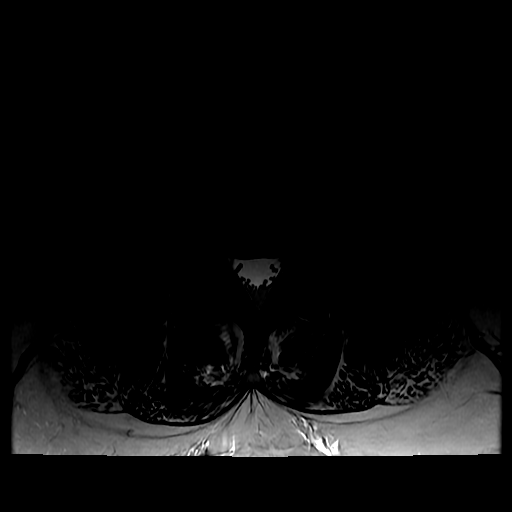
[im 15/35]
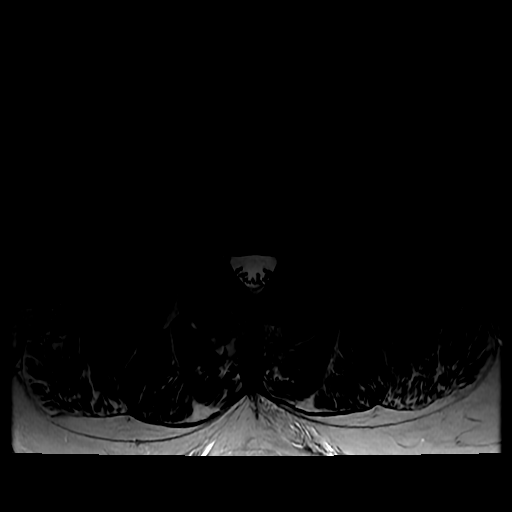
[im 18/35]
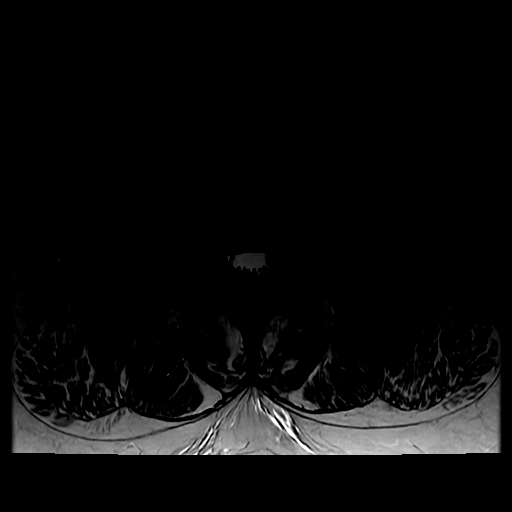
[im 30/35]
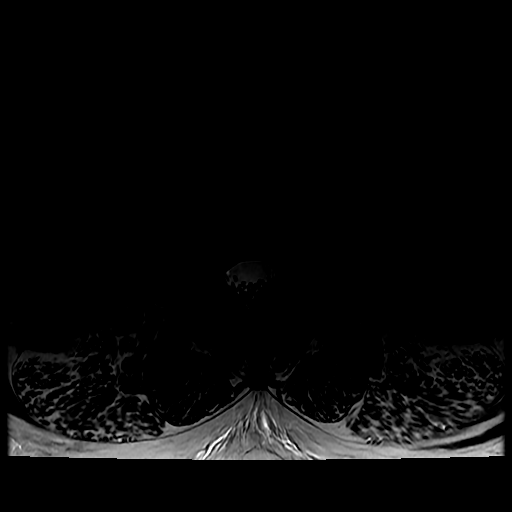

[Series 7: T1 · axial · 4.0mm · 0.39mm/px · z∈[-69,+86]mm · 3 of 35 slices shown (2 of 2)]
[im 5/35]
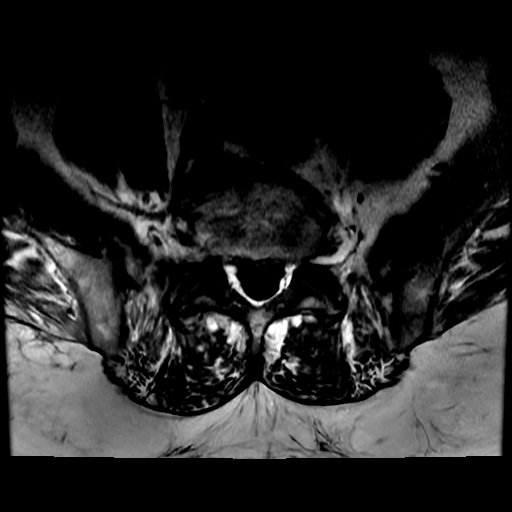
[im 18/35]
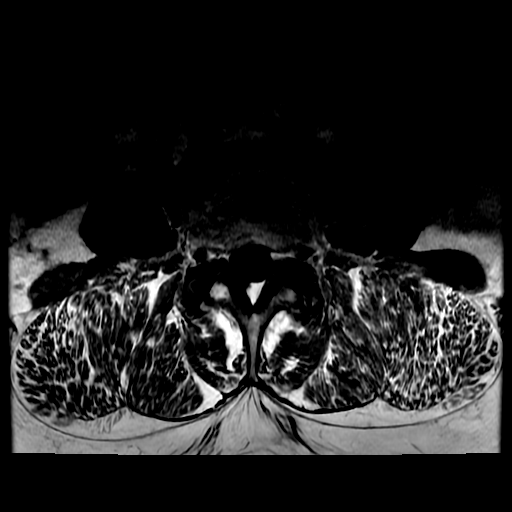
[im 30/35]
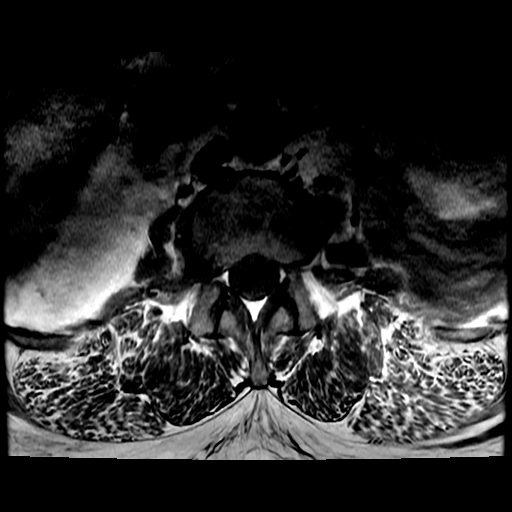

[18 of 48 positions shown; findings below may reference images not displayed]

FINDINGS: Segmentation:  Standard.

Alignment:  Physiologic.

Vertebrae: No fracture, evidence of discitis, or suspicious bone
lesion.

Conus medullaris and cauda equina: Conus extends to the L1-2 level.
Conus and cauda equina appear normal.

Paraspinal and other soft tissues: Fatty infiltration of the
posterior paraspinal musculature. No acute findings.

Disc levels:

T12-L1: Sagittal sequences only.  Unremarkable.

L1-L2: No disc protrusion. Mild bilateral facet arthropathy. No
foraminal or canal stenosis. Unchanged.

L2-L3: No disc protrusion. Mild-moderate bilateral facet
arthropathy. No foraminal or canal stenosis. Unchanged.

L3-L4: Mild disc desiccation with mild annular disc bulge. Moderate
bilateral facet arthropathy. Previously seen facet synovial cyst
along the anteromedial aspect of the right L3-4 facet joint has
decreased in size, now measuring 6 x 4 mm (series 6, image 18),
previously 7 x 7 mm. There is mild mass effect upon the
posterolateral thecal sac. Borderline-mild bilateral foraminal
stenosis. No canal stenosis.

L4-L5: Mild annular disc bulge. Mild-to-moderate bilateral facet
arthropathy. Mild-to-moderate right and mild left foraminal
stenosis. No canal stenosis. No significant interval change from
prior.

L5-S1: Disc osteophyte complex with right worse than left facet
arthropathy. Disc material contacts the bilateral descending S1
nerve root within the subarticular recesses without evidence of
neural impingement. Moderate to severe right and mild left foraminal
stenosis. No canal stenosis. No significant interval change from
prior.
IMPRESSION: 1. Mild multilevel degenerative changes of the lumbar spine, as
described above. No significant canal stenosis at any level.
2. Moderate-to-severe right and mild left foraminal stenosis at
L5-S1.
3. Mild-to-moderate right and mild left foraminal stenosis at L4-5.
4. Slight interval decrease in size of a small facet synovial cyst
along the anteromedial aspect of the right L3-4 facet joint with
mild mass effect upon the posterolateral thecal sac.

## 2021-04-14 MED ORDER — DEXAMETHASONE SODIUM PHOSPHATE 10 MG/ML IJ SOLN
10.0000 mg | Freq: Once | INTRAMUSCULAR | Status: AC
  Administered 2021-04-14: 10 mg via INTRAVENOUS
  Filled 2021-04-14: qty 1

## 2021-04-14 MED ORDER — PREDNISONE 10 MG (21) PO TBPK: ORAL_TABLET | Freq: Every day | ORAL | 0 refills | Status: DC

## 2021-04-14 MED ORDER — HYDROCODONE-ACETAMINOPHEN 5-325 MG PO TABS: 1.0000 | ORAL_TABLET | ORAL | 0 refills | Status: DC | PRN

## 2021-04-14 MED ORDER — MORPHINE SULFATE (PF) 4 MG/ML IV SOLN
4.0000 mg | Freq: Once | INTRAVENOUS | Status: AC
  Administered 2021-04-14: 4 mg via INTRAVENOUS
  Filled 2021-04-14: qty 1

## 2021-04-14 MED ORDER — ONDANSETRON HCL 4 MG/2ML IJ SOLN
4.0000 mg | Freq: Once | INTRAMUSCULAR | Status: AC
  Administered 2021-04-14: 4 mg via INTRAVENOUS
  Filled 2021-04-14: qty 2

## 2021-04-14 MED ORDER — LIDOCAINE-EPINEPHRINE (PF) 2 %-1:200000 IJ SOLN
10.0000 mL | Freq: Once | INTRAMUSCULAR | Status: AC
  Administered 2021-04-14: 10 mL via INTRADERMAL
  Filled 2021-04-14: qty 20

## 2021-04-14 MED ORDER — MORPHINE SULFATE (PF) 4 MG/ML IV SOLN
4.0000 mg | Freq: Once | INTRAVENOUS | Status: AC
Start: 2021-04-14 — End: 2021-04-14
  Administered 2021-04-14: 4 mg via INTRAVENOUS
  Filled 2021-04-14: qty 1

## 2021-04-14 MED ORDER — IOHEXOL 300 MG/ML  SOLN
100.0000 mL | Freq: Once | INTRAMUSCULAR | Status: AC | PRN
  Administered 2021-04-14: 100 mL via INTRAVENOUS

## 2021-04-14 MED ORDER — DILTIAZEM HCL 30 MG PO TABS
120.0000 mg | ORAL_TABLET | Freq: Once | ORAL | Status: AC
  Administered 2021-04-14: 120 mg via ORAL
  Filled 2021-04-14: qty 4

## 2021-04-14 NOTE — ED Provider Notes (Addendum)
[facility] EMERGENCY DEPARTMENT Provider Note   CSN: [id number] Arrival date & time: 04/14/21  2952     History Chief Complaint  Patient presents with   Back Pain   Foot Swelling    Steven Barnett is a 69 y.o. male.  70 yo M with a chief complaints of right-sided low back pain that radiates down the leg.  This been going on for a day.  [place] of walking and thinks that may be had caused him to have worsening discomfort.  Has a history of similar discomfort was actually seen in the emergency department for this and had imaging studies performed.  He denies fevers denies loss of bowel or bladder denies loss of rectal sensation.  He is complaining of some right ankle pain and swelling this been off and on for some time worsening over the past couple days.  He denies any drainage to the area denies any break in the skin.  The history is provided by the patient.  Back Pain Location:  Lumbar spine Quality:  Aching and shooting Radiates to:  R foot Pain severity:  Severe Pain is:  Same all the time Onset quality:  Gradual Duration:  2 days Timing:  Constant Progression:  Worsening Chronicity:  Recurrent Relieved by:  Nothing Worsened by:  Palpation, touching and twisting Ineffective treatments:  None tried Associated symptoms: no abdominal pain, no chest pain, no fever and no headaches       Past Medical History:  Diagnosis Date   Anemia    Arthralgia of both knees    Arthritis    "right knee" (10/01/2015)   Atrial fibrillation (HCC)    Cancer ([place])    STAGE 1 COLON CANCER   Chronic anticoagulation 2010   Coumadin   Chronic diastolic CHF (congestive heart failure), NYHA class 2 (HCC)    Dyspnea    Dysrhythmia    Hypertension    OSA (obstructive sleep apnea) 2016   "couldn't take the mask during the testing" (10/01/2015)   Permanent atrial fibrillation ([place]) 2010   Pneumonia 3/29/2017and june 2017    Patient Active  Problem List   Diagnosis Date Noted   Hypovolemia 01/14/2021   Hypokalemia 01/14/2021   Hypomagnesemia 01/14/2021   Normocytic anemia 01/14/2021   Gait instability 01/14/2021   Encounter for therapeutic drug monitoring 10/12/2018   Hypotension 08/17/2016   SIRS (systemic inflammatory response syndrome) ([place]) 08/17/2016   Influenza A 08/17/2016   Septic shock ([place]) 08/10/2016   Neutropenia, drug-induced ([place])    Gastroenteritis due to norovirus    Acute kidney injury ([place])    Genetic testing 07/01/2016   Port catheter in place 06/29/2016   Family history of colon cancer 06/08/2016   Family history of colonic polyps 06/08/2016   Cancer of right colon ([place]) 04/12/2016   Pre-operative cardiovascular examination 04/06/2016   Acute on chronic diastolic heart failure ([place]) 02/02/2016   Acute respiratory failure with hypoxia (HCC) 02/02/2016   Anemia, iron deficiency 02/02/2016   Current use of long term anticoagulation 12/03/2015   Pneumonia 09/30/2015   PNA (pneumonia) 09/30/2015   Acute on chronic congestive heart failure (HCC)    CHF exacerbation ([place]) 07/08/2015   Community acquired pneumonia 02/10/2015   ED (erectile dysfunction) 08/12/2014   Arthralgia of both knees 04/08/2014   Chronic diastolic (congestive) heart failure ([place]) 01/14/2009   Morbid obesity ([place]) 12/31/2008   Obstructive sleep apnea 12/31/2008   Essential hypertension,  benign 12/31/2008   Atrial fibrillation ([place]) 12/31/2008    Past Surgical History:  Procedure Laterality Date   COLONOSCOPY WITH PROPOFOL N/A 03/04/2016   Procedure: COLONOSCOPY WITH PROPOFOL;  Surgeon: Carol Ada, MD;  Location: [facility];  Service: Endoscopy;  Laterality: N/A;   COLONOSCOPY WITH PROPOFOL N/A 09/15/2017   Procedure: COLONOSCOPY WITH PROPOFOL;  Surgeon: Carol Ada, MD;  Location: [facility];  Service: Endoscopy;  Laterality: N/A;   COLONOSCOPY WITH PROPOFOL N/A 02/12/2021   Procedure: COLONOSCOPY WITH PROPOFOL;   Surgeon: Carol Ada, MD;  Location: [facility];  Service: Endoscopy;  Laterality: N/A;   LAPAROSCOPIC PARTIAL COLECTOMY N/A 04/12/2016   Procedure: LAPAROSCOPIC ILEOCOLECTOMY;  Surgeon: [name], MD;  Location: [facility] OR;  Service: General;  Laterality: N/A;   PORT-A-CATH REMOVAL N/A 02/16/2017   Procedure: REMOVAL PORT-A-CATH;  Surgeon: [name], MD;  Location: [facility];  Service: General;  Laterality: N/A;   PORTACATH PLACEMENT N/A 05/18/2016   Procedure: INSERTION PORT-A-CATH;  Surgeon: [name], MD;  Location: [facility];  Service: General;  Laterality: N/A;   THORACOTOMY Right 2010       Family History  Problem Relation Age of Onset   Heart disease Father    Heart failure Father    Colon polyps Father        unspecified number - pt of intestine surgically resected   Hypertension Mother    Diabetes Mother    Hyperlipidemia Mother    Colon polyps Mother        unspecified number - pt of intestine surgically resected   Dementia Mother        d. 64   Stroke Maternal Grandmother    Stroke Sister    Colon cancer Sister 58       w/ "cancerous polyps" - unspecified number; underwent surgery and radiation   Colon polyps Brother        unspecified number - polypectomies   Colon cancer Sister        dx 74-60; s/p surgery and radiation   Bone cancer Maternal Uncle        dx. 36s   Lung cancer Paternal Grandfather        d. 22s-90s; lung cancer vs TB   Colon cancer Sister        dx. 55-60; s/p surgery and radiation   Cancer Maternal Uncle        mother had about 7-8 other siblings, most passed at older ages, many of whom had some type of cancer   Heart attack Neg Hx     Social History   Tobacco Use   Smoking status: Former    Packs/day: 0.10    Years: 2.00    Pack years: 0.20    Types: Cigarettes    Quit date: 12/16/1974    Years since quitting: 46.3   Smokeless tobacco: Never   Tobacco comments:    "1 pack cigarettes would last me a month"   Vaping Use   Vaping Use: Never used  Substance Use Topics   Alcohol use: No   Drug use: No    Types: Cocaine, Marijuana    Comment: Hx polysubstance abuse, quit 2008    Home Medications Prior to Admission medications   Medication Sig Start Date End Date Taking? Authorizing Provider  acetaminophen (TYLENOL) 500 MG tablet Take 1,000 mg by mouth 2 (two) times daily.    [provider]  albuterol (PROVENTIL HFA;VENTOLIN HFA) 108 (90 Base) MCG/ACT inhaler Inhale 2  puffs into the lungs every 6 (six) hours as needed for wheezing or shortness of breath. 10/01/15   Nita Sells, MD  aspirin EC 81 MG tablet Take 81 mg by mouth daily.    [provider]  diltiazem (CARDIZEM) 120 MG tablet TAKE 1 TABLET BY MOUTH ONCE DAILY 12/13/17   Hilty, [name], MD  furosemide (LASIX) 40 MG tablet Take 1 tablet (40 mg total) by mouth daily. 01/27/21   Loel Dubonnet, NP  MITIGARE 0.6 MG CAPS Take 0.6 mg by mouth 2 (two) times daily. 11/07/19   [provider]  pantoprazole (PROTONIX) 40 MG tablet Take 1 tablet (40 mg total) by mouth daily. 01/16/21   Thurnell Lose, MD  potassium chloride (KLOR-CON) 10 MEQ tablet Take 1 tablet (10 mEq total) by mouth daily. 01/27/21   Loel Dubonnet, NP  Turmeric (QC TUMERIC COMPLEX PO) Take 1 tablet by mouth daily.    [provider]  warfarin (COUMADIN) 4 MG tablet TAKE 1 TO 2 TABLETS AS DIRECTED BY COUMADIN CLINIC 03/10/21   Hilty, [name], MD    Allergies    Patient has no known allergies.  Review of Systems   Review of Systems  Constitutional:  Negative for chills and fever.  HENT:  Negative for congestion and facial swelling.   Eyes:  Negative for discharge and visual disturbance.  Respiratory:  Negative for shortness of breath.   Cardiovascular:  Negative for chest pain and palpitations.  Gastrointestinal:  Negative for abdominal pain, diarrhea and vomiting.  Musculoskeletal:  Positive for arthralgias and back pain.  Negative for myalgias.  Skin:  Negative for color change and rash.  Neurological:  Negative for tremors, syncope and headaches.  Psychiatric/Behavioral:  Negative for confusion and dysphoric mood.    Physical Exam Updated Vital Signs BP (!) 154/96   Pulse (!) 118   Temp 99.1 F (37.3 C) (Oral)   Resp (!) 21   SpO2 100%   Physical Exam Vitals and nursing note reviewed.  Constitutional:      Appearance: He is well-developed.  HENT:     Head: Normocephalic and atraumatic.  Eyes:     Pupils: Pupils are equal, round, and reactive to light.  Neck:     Vascular: No JVD.  Cardiovascular:     Rate and Rhythm: Normal rate and regular rhythm.     Heart sounds: No murmur heard.   No friction rub. No gallop.  Pulmonary:     Effort: No respiratory distress.     Breath sounds: No wheezing.  Abdominal:     General: There is no distension.     Tenderness: There is no abdominal tenderness. There is no guarding or rebound.  Musculoskeletal:        General: Swelling and tenderness present. Normal range of motion.     Cervical back: Normal range of motion and neck supple.     Comments: Pain and swelling to the right ankle.  Some thinning of the skin on both the medial and lateral aspect of the ankle.  Severe pain with minimal range of motion.  Mildly warm to touch.  No obvious step-offs or deformities to the L-spine but severe tenderness about the lower aspect.  Skin:    Coloration: Skin is not pale.     Findings: No rash.  Neurological:     Mental Status: He is alert and oriented to person, place, and time.  Psychiatric:        Behavior: Behavior  normal.    ED Results / Procedures / Treatments   Labs (all labs ordered are listed, but only abnormal results are displayed) Labs Reviewed  CBC WITH DIFFERENTIAL/PLATELET - Abnormal; Notable for the following components:      Result Value   WBC 15.2 (*)    Neutro Abs 13.3 (*)    Lymphs Abs 0.5 (*)    Monocytes Absolute 1.2 (*)    Abs  Immature Granulocytes 0.12 (*)    All other components within normal limits  SEDIMENTATION RATE - Abnormal; Notable for the following components:   Sed Rate 31 (*)    All other components within normal limits  PROTIME-INR - Abnormal; Notable for the following components:   Prothrombin Time 25.8 (*)    INR 2.4 (*)    All other components within normal limits  CULTURE, BLOOD (ROUTINE X 2)  CULTURE, BLOOD (ROUTINE X 2)  BODY FLUID CULTURE W GRAM STAIN  BASIC METABOLIC PANEL  C-REACTIVE PROTEIN  GLUCOSE, BODY FLUID OTHER            PROTEIN, BODY FLUID (OTHER)  SYNOVIAL CELL COUNT + DIFF, W/ CRYSTALS    EKG None  Radiology DG Ankle Complete Right  Result Date: 04/14/2021 CLINICAL DATA:  Right ankle pain EXAM: RIGHT ANKLE - COMPLETE 3+ VIEW COMPARISON:  None. FINDINGS: No fracture or dislocation of the right ankle. Moderate medial gutter arthrosis. Moderate midfoot arthrosis. Diffuse soft tissue edema about the ankle. IMPRESSION: 1.  No fracture or dislocation of the right ankle. 2. Moderate medial gutter arthrosis of the ankle mortise. Moderate midfoot arthrosis. 3.  Diffuse soft tissue edema about the ankle. Electronically Signed   By: Delanna Ahmadi M.D.   On: 04/14/2021 10:18   CT LUMBAR SPINE W CONTRAST  Result Date: 04/14/2021 CLINICAL DATA:  Low back pain shooting down legs and into feet EXAM: CT LUMBAR SPINE WITH CONTRAST TECHNIQUE: Multidetector CT imaging of the lumbar spine was performed with intravenous contrast administration. CONTRAST:  185mL OMNIPAQUE IOHEXOL 300 MG/ML  SOLN COMPARISON:  Lumbar spine MRI 08/15/2020, CT lumbar spine 08/15/2020 FINDINGS: Segmentation: There are 5 non-rib-bearing lumbar type vertebral bodies. The lowest formed disc space is designated L5-S1. Alignment: There is trace retrolisthesis of L5 on S1, unchanged. Alignment is otherwise normal. Vertebrae: Vertebral body heights are preserved. There is no suspicious osseous lesion. There is no destructive  change to suggest infection. There are bulky anterior osteophytes throughout the lumbar spine, similar to the prior study. There are flowing anterior osteophytes in the lower thoracic spine, unchanged. Paraspinal and other soft tissues: The paraspinal soft tissues are unremarkable. Dystrophic calcifications in the right gluteal fascia are unchanged. There is atelectasis in the right lung base. Disc levels: There is mild multilevel intervertebral disc space narrowing and multilevel facet arthropathy. T12-L1: No significant spinal canal or neural foraminal stenosis. L1-L2: No significant spinal canal or neural foraminal stenosis. L2-L3: There is mild bilateral facet arthropathy without significant spinal canal or neural foraminal stenosis. 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 The lesion may be slightly decreased in size compared to the prior CT, though there is persistent partial effacement of the right subarticular zone. There is stable degenerative endplate change and facet arthropathy resulting in mild spinal canal stenosis and mild bilateral neural foraminal stenosis L4-L5: There is a mild disc bulge, degenerative endplate change, and bilateral facet arthropathy resulting in mild spinal canal stenosis and  moderate bilateral neural foraminal stenosis, unchanged. L5-S1: There is a mild disc bulge, degenerative endplate change, and bilateral facet arthropathy resulting in moderate bilateral neural foraminal stenosis, right worse than left, not significantly changed. IMPRESSION: 1. 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 The cyst appears slightly decreased in size when directly compared to the prior CT from 08/15/2020, though there is persistent partial effacement of the right subarticular zone. This could be better characterized by  MRI as indicated. 2. Otherwise, multilevel degenerative changes throughout the remainder of the lumbar spine are not significantly changed, resulting in up to moderate bilateral neural foraminal stenosis at L4-L5 and L5-S1. Electronically Signed   By: Valetta Mole M.D.   On: 04/14/2021 11:50    Procedures .Joint Aspiration/Arthrocentesis  Date/Time: 04/14/2021 1:23 PM Performed by: Deno Etienne, DO Authorized by: Deno Etienne, DO   Consent:    Consent obtained:  Verbal   Consent given by:  Patient   Risks, benefits, and alternatives were discussed: yes     Risks discussed:  Bleeding, infection, incomplete drainage and pain   Alternatives discussed:  No treatment, delayed treatment and alternative treatment Universal protocol:    Imaging studies available: yes     Immediately prior to procedure, a time out was called: yes     Patient identity confirmed:  Verbally with patient Location:    Location:  Ankle   Ankle:  R ankle Anesthesia:    Anesthesia method:  Local infiltration   Local anesthetic:  Lidocaine 2% WITH epi Procedure details:    Preparation: Patient was prepped and draped in usual sterile fashion     Needle gauge: 21G.   Ultrasound guidance: no     Approach:  Anterior   Aspirate characteristics:  Cloudy, yellow and serous   Steroid injected: no     Specimen collected: yes   Post-procedure details:    Dressing:  Adhesive bandage   Procedure completion:  Tolerated well, no immediate complications   Medications Ordered in ED Medications  morphine 4 MG/ML injection 4 mg (4 mg Intravenous Given 04/14/21 0929)  ondansetron (ZOFRAN) injection 4 mg (4 mg Intravenous Given 04/14/21 0929)  lidocaine-EPINEPHrine (XYLOCAINE W/EPI) 2 %-1:200000 (PF) injection 10 mL (10 mLs Intradermal Given by Other 04/14/21 0929)  diltiazem (CARDIZEM) tablet 120 mg (120 mg Oral Given 04/14/21 1026)  iohexol (OMNIPAQUE) 300 MG/ML solution 100 mL (100 mLs Intravenous Contrast Given 04/14/21 1111)     ED Course  I have reviewed the triage vital signs and the nursing notes.  Pertinent labs & imaging results that were available during my care of the patient were reviewed by me and considered in my medical decision making (see chart for details).    MDM Rules/Calculators/A&P                           70 yo M with a chief complaints of right-sided low back pain.  This is a recurrent issue for him.  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  He had symptomatic improvement with steroids at that time and no intervention was recommended.  He denies recent trauma or fevers.  He has a temp here of 99.  He also has signs concerning for infection of the right ankle though he tells me he  has been having symptoms there for some time off and on.  We will obtain a plain film.  Arthrocentesis.  CT scan of the L-spine is at all have access to MRI at the stand-alone center.  CT scan with again the visualized cyst, no otherwise concerning finding.  With the patient having that cyst even though it looks somewhat smaller than it was previously that would concern me for some spinal compression with acute onset of symptoms.  Will send him for MRI at [facility].  I discussed this with Dr. Zenia Resides who accepts in transfer.  Still awaiting fluid studies of the right ankle.  Would perhaps need a discussion with Ortho should be concerning for septic arthritis.  Patient does have a leukocytosis and has a sed rate that is elevated.  He is mildly hypoxic here but after morphine administration.  Also in A. fib and is relatively rate controlled here.   The patients results and plan were reviewed and discussed.   Any x-rays performed were independently reviewed by myself.   Differential diagnosis were considered with the presenting HPI.  Medications  morphine 4 MG/ML injection 4 mg (4 mg Intravenous Given 04/14/21 0929)  ondansetron (ZOFRAN) injection 4 mg  (4 mg Intravenous Given 04/14/21 0929)  lidocaine-EPINEPHrine (XYLOCAINE W/EPI) 2 %-1:200000 (PF) injection 10 mL (10 mLs Intradermal Given by Other 04/14/21 0929)  diltiazem (CARDIZEM) tablet 120 mg (120 mg Oral Given 04/14/21 1026)  iohexol (OMNIPAQUE) 300 MG/ML solution 100 mL (100 mLs Intravenous Contrast Given 04/14/21 1111)    Vitals:   04/14/21 1000 04/14/21 1012 04/14/21 1030 04/14/21 1045  BP: (!) 159/102  (!) 172/100 (!) 154/96  Pulse: (!) 120  (!) 116 (!) 118  Resp: (!) 23  (!) 28 (!) 21  Temp:      TempSrc:      SpO2: 91% (S) 100% 99% 100%    Final diagnoses:  Sciatica of right side  Acute right ankle pain  Paroxysmal atrial fibrillation (HCC)     Final Clinical Impression(s) / ED Diagnoses Final diagnoses:  Sciatica of right side  Acute right ankle pain  Paroxysmal atrial fibrillation [facility])    Rx / DC Orders ED Discharge Orders     None        Deno Etienne, DO 04/14/21 [place], DO 04/14/21 1324

## 2021-04-14 NOTE — ED Triage Notes (Signed)
[facility] for [facility]. Has chronic back pain from lesion L3/L4. Walked a lot yesterday and now has increased pain and swellong lower extremities bilaterally. Pain 10/10. Was given mso4 at [facility] and had sats drop so on 02 2 liters.

## 2021-04-14 NOTE — ED Notes (Signed)
ED Provider at bedside. 

## 2021-04-14 NOTE — ED Notes (Signed)
XR at bedside

## 2021-04-14 NOTE — ED Provider Notes (Signed)
Pt transferred here from [facility] for a MRI of his back.    Dr. Tyrone Nine did a joint tap to check for a septic joint.  Crystal analysis suggests gout.  MRI lumbar spine  IMPRESSION: 1. Mild multilevel degenerative changes of the lumbar spine, as described above. No significant canal stenosis at any level. 2. Moderate-to-severe right and mild left foraminal stenosis at L5-S1. 3. Mild-to-moderate right and mild left foraminal stenosis at L4-5. 4. Slight interval decrease in size of a small facet synovial cyst along the anteromedial aspect of the right L3-4 facet joint with mild mass effect upon the posterolateral thecal sac.    Pt will be d/c with lortab and prednisone which should help gout and his back.  He is to f/u with ns and with pcp.  Return if worse.   Isla Pence, MD 04/14/21 1606

## 2021-04-14 NOTE — ED Notes (Signed)
Patient transported to MRI 

## 2021-04-14 NOTE — ED Notes (Signed)
[facility] at bedside 

## 2021-04-14 NOTE — ED Triage Notes (Signed)
Per EMS, Pt, from home, c/o chronic lower back pain shooting down legs and into feet x 2 days and bilateral foot swelling x 1 day.  Pain score 10/10.  Pt was in 08/2020 and diagnosed w/ cyst on spine.  Sts "they can't remove it because it might paralyze me."  Denies injury.  Sts he did a significant amount of walking x 4 days ago.

## 2021-04-16 ENCOUNTER — Inpatient Hospital Stay: Payer: Medicare HMO | Admitting: Hematology

## 2021-04-16 ENCOUNTER — Inpatient Hospital Stay: Payer: Medicare HMO | Attending: Hematology

## 2021-04-17 LAB — BODY FLUID CULTURE W GRAM STAIN: Culture: NO GROWTH

## 2021-04-19 LAB — CULTURE, BLOOD (ROUTINE X 2)
Culture: NO GROWTH
Culture: NO GROWTH
Special Requests: ADEQUATE
Special Requests: ADEQUATE

## 2021-04-19 LAB — GLUCOSE, BODY FLUID OTHER

## 2021-04-19 LAB — PROTEIN, BODY FLUID (OTHER)

## 2021-04-22 ENCOUNTER — Telehealth: Payer: Self-pay

## 2021-04-22 NOTE — Telephone Encounter (Signed)
Lp's daughter message to see if patient wants to move up INR check to today 10/20.

## 2021-04-28 ENCOUNTER — Telehealth: Payer: Self-pay | Admitting: Hematology

## 2021-04-28 NOTE — Telephone Encounter (Signed)
Scheduled per sch msg. Called and left msg  

## 2021-04-30 ENCOUNTER — Ambulatory Visit (INDEPENDENT_AMBULATORY_CARE_PROVIDER_SITE_OTHER): Payer: Medicare HMO

## 2021-04-30 ENCOUNTER — Other Ambulatory Visit: Payer: Self-pay

## 2021-04-30 DIAGNOSIS — I4821 Permanent atrial fibrillation: Secondary | ICD-10-CM

## 2021-04-30 DIAGNOSIS — Z5181 Encounter for therapeutic drug level monitoring: Secondary | ICD-10-CM

## 2021-04-30 LAB — POCT INR: INR: 3.3 — AB (ref 2.0–3.0)

## 2021-04-30 NOTE — Patient Instructions (Signed)
Continue taking 2 tables daily except 1 tablet on Monday, Wednesday and Friday. INR in 4 weeks. Call us with medication changes or concerns [phone]. Eat greens tonight.

## 2021-05-18 ENCOUNTER — Other Ambulatory Visit: Payer: Medicare HMO

## 2021-05-18 ENCOUNTER — Ambulatory Visit: Payer: Medicare HMO | Admitting: Hematology

## 2021-05-18 DIAGNOSIS — C182 Malignant neoplasm of ascending colon: Secondary | ICD-10-CM

## 2021-05-26 ENCOUNTER — Telehealth: Payer: Self-pay

## 2021-05-26 NOTE — Telephone Encounter (Signed)
Unable to lmom to r/s voicemail was full

## 2021-06-04 ENCOUNTER — Telehealth: Payer: Self-pay

## 2021-06-04 NOTE — Telephone Encounter (Signed)
I tried calling both telephone #s to reschedule INR check, unsuccessful.

## 2021-06-16 ENCOUNTER — Telehealth: Payer: Self-pay

## 2021-06-16 NOTE — Telephone Encounter (Signed)
I tried calling patient to reschedule INR check, but mailbox is full.

## 2021-06-23 ENCOUNTER — Other Ambulatory Visit (HOSPITAL_BASED_OUTPATIENT_CLINIC_OR_DEPARTMENT_OTHER): Payer: Self-pay | Admitting: Family

## 2021-06-23 DIAGNOSIS — I5032 Chronic diastolic (congestive) heart failure: Secondary | ICD-10-CM

## 2021-07-12 ENCOUNTER — Other Ambulatory Visit: Payer: Self-pay

## 2021-07-14 ENCOUNTER — Ambulatory Visit (INDEPENDENT_AMBULATORY_CARE_PROVIDER_SITE_OTHER): Payer: Medicare HMO

## 2021-07-14 ENCOUNTER — Other Ambulatory Visit: Payer: Self-pay

## 2021-07-14 DIAGNOSIS — I4821 Permanent atrial fibrillation: Secondary | ICD-10-CM | POA: Diagnosis not present

## 2021-07-14 DIAGNOSIS — Z5181 Encounter for therapeutic drug level monitoring: Secondary | ICD-10-CM

## 2021-07-14 LAB — POCT INR: INR: 1.9 — AB (ref 2.0–3.0)

## 2021-07-14 NOTE — Patient Instructions (Signed)
TAKE 2 TABLETS TONIGHT ONLY and then Continue taking 2 tablets daily except 1 tablet on Monday, Wednesday and Friday. INR in 4 weeks. Call us with medication changes or concerns [phone].

## 2021-07-27 ENCOUNTER — Telehealth: Payer: Self-pay

## 2021-07-27 NOTE — Telephone Encounter (Signed)
° °  Pre-operative Risk Assessment    Patient Name: FRANCISZEK PLATTEN  DOB: 1951/02/25 MRN: [id number]     Request for Surgical Clearance    Procedure:  Dental Extraction - Amount of Teeth to be Pulled:  Multiple   Date of Surgery:  Clearance TBD                                 Surgeon:  Loney Hering Group or Practice Name:  [facility]  Phone number:  [phone] Fax number:  [phone]   Type of Clearance Requested:   - Pharmacy:  Hold Warfarin (Coumadin)     Type of Anesthesia:  Local    Additional requests/questions:      Signed, Eliezer Lofts B Brysten Reister   07/27/2021, 2:40 PM

## 2021-07-28 NOTE — Telephone Encounter (Signed)
See notes below: Tx at this time is for 1 tooth to be surgically extracted.     I called the dental office to confirm how many teeth are being extracted . I was told 4 teeth, however I was then told it is a multi Tx plan and not all 4 teeth are being extracted at the same time.   I informed dental office that we can not provide a blanket type clearance and that is may be a nuisance for their office, however, we will require a new clearance request for each Tx plan as pt is on a blood thinner. Dental office stated she is not sure why we have to do it this way. She said she is wanting to know if pt can hold blood thinner for example 1-2 days for Tx plans.   I explained again that we cannot provide blanket type clearance and though I understand what the dental office is asking. I explained that these are cardiac pt's and as anything can happen with the pt cardiac wise, in between the dental Tx plan it is VERY IMPORTANT that the pt is assessed for each Tx plan to be sure are protecting the pt in all aspects and to be able to appropriately assess the pt for clearance.   I will update the pre op provider as to my conversation with dental office.

## 2021-07-28 NOTE — Telephone Encounter (Signed)
° °  Patient Name: Steven Barnett  DOB: 04-20-1951 MRN: [id number]  Primary Cardiologist: Pixie Casino, MD  Chart reviewed as part of pre-operative protocol coverage.   Dental extractions or one or two teeth are considered low risk procedures per guidelines and generally do not require any specific cardiac clearance. It is also generally accepted that for extraction of 1 or 2 teeth and dental cleanings, there is no need to interrupt blood thinner therapy. Patient is not cleared to hold coumadin based on updated treatment plan.    SBE prophylaxis is not required for the patient from a cardiac standpoint.   I will route this recommendation to the requesting party via Epic fax function and remove from pre-op pool.  Please call with questions.  Tami Lin Avyukt Cimo, PA 07/28/2021, 10:56 AM

## 2021-08-13 ENCOUNTER — Telehealth: Payer: Self-pay

## 2021-08-13 NOTE — Telephone Encounter (Signed)
Tried calling pt's daughter to schedule pt's INR check, but mailbox full.

## 2021-08-31 ENCOUNTER — Other Ambulatory Visit: Payer: Self-pay

## 2021-08-31 ENCOUNTER — Ambulatory Visit (INDEPENDENT_AMBULATORY_CARE_PROVIDER_SITE_OTHER): Payer: Medicare HMO | Admitting: *Deleted

## 2021-08-31 DIAGNOSIS — I4821 Permanent atrial fibrillation: Secondary | ICD-10-CM | POA: Diagnosis not present

## 2021-08-31 DIAGNOSIS — Z5181 Encounter for therapeutic drug level monitoring: Secondary | ICD-10-CM

## 2021-08-31 LAB — POCT INR: INR: 2.1 (ref 2.0–3.0)

## 2021-08-31 NOTE — Patient Instructions (Signed)
Description    Continue taking 2 tablets daily except 1 tablet on Monday, Wednesday and Friday. INR in 4 weeks. Call us with medication changes or concerns [phone].

## 2021-09-07 ENCOUNTER — Other Ambulatory Visit: Payer: Self-pay | Admitting: Internal Medicine

## 2021-09-22 ENCOUNTER — Other Ambulatory Visit (HOSPITAL_BASED_OUTPATIENT_CLINIC_OR_DEPARTMENT_OTHER): Payer: Self-pay | Admitting: Family

## 2021-09-22 DIAGNOSIS — E876 Hypokalemia: Secondary | ICD-10-CM

## 2021-10-09 ENCOUNTER — Other Ambulatory Visit: Payer: Self-pay

## 2021-10-09 ENCOUNTER — Emergency Department (HOSPITAL_BASED_OUTPATIENT_CLINIC_OR_DEPARTMENT_OTHER): Payer: Medicare HMO

## 2021-10-09 ENCOUNTER — Inpatient Hospital Stay (HOSPITAL_BASED_OUTPATIENT_CLINIC_OR_DEPARTMENT_OTHER)
Admission: EM | Admit: 2021-10-09 | Discharge: 2021-10-18 | DRG: 065 | Disposition: A | Discharge status: Home / Self Care | Payer: Medicare HMO | Attending: Orthopedic Surgery | Admitting: Orthopedic Surgery

## 2021-10-09 ENCOUNTER — Encounter (HOSPITAL_BASED_OUTPATIENT_CLINIC_OR_DEPARTMENT_OTHER): Payer: Self-pay

## 2021-10-09 DIAGNOSIS — R471 Dysarthria and anarthria: Secondary | ICD-10-CM | POA: Diagnosis present

## 2021-10-09 DIAGNOSIS — R569 Unspecified convulsions: Secondary | ICD-10-CM

## 2021-10-09 DIAGNOSIS — Z9049 Acquired absence of other specified parts of digestive tract: Secondary | ICD-10-CM

## 2021-10-09 DIAGNOSIS — G4733 Obstructive sleep apnea (adult) (pediatric): Secondary | ICD-10-CM | POA: Diagnosis present

## 2021-10-09 DIAGNOSIS — Z85038 Personal history of other malignant neoplasm of large intestine: Secondary | ICD-10-CM | POA: Diagnosis not present

## 2021-10-09 DIAGNOSIS — I4891 Unspecified atrial fibrillation: Secondary | ICD-10-CM | POA: Diagnosis not present

## 2021-10-09 DIAGNOSIS — R1310 Dysphagia, unspecified: Secondary | ICD-10-CM | POA: Diagnosis present

## 2021-10-09 DIAGNOSIS — R6510 Systemic inflammatory response syndrome (SIRS) of non-infectious origin without acute organ dysfunction: Secondary | ICD-10-CM | POA: Diagnosis present

## 2021-10-09 DIAGNOSIS — Z20822 Contact with and (suspected) exposure to covid-19: Secondary | ICD-10-CM | POA: Diagnosis present

## 2021-10-09 DIAGNOSIS — Z83438 Family history of other disorder of lipoprotein metabolism and other lipidemia: Secondary | ICD-10-CM

## 2021-10-09 DIAGNOSIS — K5900 Constipation, unspecified: Secondary | ICD-10-CM | POA: Diagnosis not present

## 2021-10-09 DIAGNOSIS — I619 Nontraumatic intracerebral hemorrhage, unspecified: Secondary | ICD-10-CM | POA: Diagnosis present

## 2021-10-09 DIAGNOSIS — I629 Nontraumatic intracranial hemorrhage, unspecified: Secondary | ICD-10-CM | POA: Diagnosis not present

## 2021-10-09 DIAGNOSIS — I161 Hypertensive emergency: Secondary | ICD-10-CM

## 2021-10-09 DIAGNOSIS — Z7982 Long term (current) use of aspirin: Secondary | ICD-10-CM

## 2021-10-09 DIAGNOSIS — I5032 Chronic diastolic (congestive) heart failure: Secondary | ICD-10-CM | POA: Diagnosis present

## 2021-10-09 DIAGNOSIS — M109 Gout, unspecified: Secondary | ICD-10-CM | POA: Clinically undetermined

## 2021-10-09 DIAGNOSIS — Z79899 Other long term (current) drug therapy: Secondary | ICD-10-CM | POA: Diagnosis not present

## 2021-10-09 DIAGNOSIS — D72829 Elevated white blood cell count, unspecified: Secondary | ICD-10-CM | POA: Diagnosis not present

## 2021-10-09 DIAGNOSIS — I4821 Permanent atrial fibrillation: Secondary | ICD-10-CM | POA: Diagnosis present

## 2021-10-09 DIAGNOSIS — G40209 Localization-related (focal) (partial) symptomatic epilepsy and epileptic syndromes with complex partial seizures, not intractable, without status epilepticus: Secondary | ICD-10-CM | POA: Diagnosis present

## 2021-10-09 DIAGNOSIS — Z8249 Family history of ischemic heart disease and other diseases of the circulatory system: Secondary | ICD-10-CM

## 2021-10-09 DIAGNOSIS — E871 Hypo-osmolality and hyponatremia: Secondary | ICD-10-CM

## 2021-10-09 DIAGNOSIS — I110 Hypertensive heart disease with heart failure: Secondary | ICD-10-CM | POA: Diagnosis present

## 2021-10-09 DIAGNOSIS — Z7901 Long term (current) use of anticoagulants: Secondary | ICD-10-CM

## 2021-10-09 DIAGNOSIS — D696 Thrombocytopenia, unspecified: Secondary | ICD-10-CM | POA: Diagnosis present

## 2021-10-09 DIAGNOSIS — D6832 Hemorrhagic disorder due to extrinsic circulating anticoagulants: Secondary | ICD-10-CM | POA: Diagnosis present

## 2021-10-09 DIAGNOSIS — I6389 Other cerebral infarction: Secondary | ICD-10-CM | POA: Diagnosis not present

## 2021-10-09 DIAGNOSIS — T45515A Adverse effect of anticoagulants, initial encounter: Secondary | ICD-10-CM | POA: Diagnosis present

## 2021-10-09 DIAGNOSIS — Z716 Tobacco abuse counseling: Secondary | ICD-10-CM

## 2021-10-09 DIAGNOSIS — I610 Nontraumatic intracerebral hemorrhage in hemisphere, subcortical: Principal | ICD-10-CM | POA: Diagnosis present

## 2021-10-09 DIAGNOSIS — K5904 Chronic idiopathic constipation: Secondary | ICD-10-CM | POA: Diagnosis not present

## 2021-10-09 DIAGNOSIS — R29703 NIHSS score 3: Secondary | ICD-10-CM | POA: Diagnosis present

## 2021-10-09 DIAGNOSIS — F17210 Nicotine dependence, cigarettes, uncomplicated: Secondary | ICD-10-CM | POA: Diagnosis present

## 2021-10-09 DIAGNOSIS — E1165 Type 2 diabetes mellitus with hyperglycemia: Secondary | ICD-10-CM | POA: Diagnosis present

## 2021-10-09 LAB — COMPREHENSIVE METABOLIC PANEL
ALT: 15 U/L (ref 0–44)
AST: 22 U/L (ref 15–41)
Albumin: 4.1 g/dL (ref 3.5–5.0)
Alkaline Phosphatase: 122 U/L (ref 38–126)
Anion gap: 6 (ref 5–15)
BUN: 13 mg/dL (ref 8–23)
CO2: 30 mmol/L (ref 22–32)
Calcium: 8.8 mg/dL — ABNORMAL LOW (ref 8.9–10.3)
Chloride: 98 mmol/L (ref 98–111)
Creatinine, Ser: 1.04 mg/dL (ref 0.61–1.24)
GFR, Estimated: >60 mL/min (ref >60)
Glucose, Bld: 204 mg/dL — ABNORMAL HIGH (ref 70–99)
Potassium: 3.7 mmol/L (ref 3.5–5.1)
Sodium: 134 mmol/L — ABNORMAL LOW (ref 135–145)
Total Bilirubin: 1.6 mg/dL — ABNORMAL HIGH (ref 0.3–1.2)
Total Protein: 8.3 g/dL — ABNORMAL HIGH (ref 6.5–8.1)

## 2021-10-09 LAB — RAPID URINE DRUG SCREEN, HOSP PERFORMED
Amphetamines: NOT DETECTED
Barbiturates: NOT DETECTED
Benzodiazepines: NOT DETECTED
Cocaine: NOT DETECTED
Opiates: NOT DETECTED
Tetrahydrocannabinol: NOT DETECTED

## 2021-10-09 LAB — DIFFERENTIAL
Abs Immature Granulocytes: 0.02 10*3/uL (ref 0.00–0.07)
Basophils Absolute: 0 10*3/uL (ref 0.0–0.1)
Basophils Relative: 1 %
Eosinophils Absolute: 0.1 10*3/uL (ref 0.0–0.5)
Eosinophils Relative: 1 %
Immature Granulocytes: 0 %
Lymphocytes Relative: 8 %
Lymphs Abs: 0.5 10*3/uL — ABNORMAL LOW (ref 0.7–4.0)
Monocytes Absolute: 0.4 10*3/uL (ref 0.1–1.0)
Monocytes Relative: 6 %
Neutro Abs: 5.4 10*3/uL (ref 1.7–7.7)
Neutrophils Relative %: 84 %

## 2021-10-09 LAB — URINALYSIS, ROUTINE W REFLEX MICROSCOPIC
Bilirubin Urine: NEGATIVE
Glucose, UA: 100 mg/dL — AB
Ketones, ur: NEGATIVE mg/dL
Leukocytes,Ua: NEGATIVE
Nitrite: NEGATIVE
Protein, ur: 100 mg/dL — AB
Specific Gravity, Urine: 1.02 (ref 1.005–1.030)
pH: 6.5 (ref 5.0–8.0)

## 2021-10-09 LAB — CBC
HCT: 47.4 % (ref 39.0–52.0)
Hemoglobin: 15.4 g/dL (ref 13.0–17.0)
MCH: 27.1 pg (ref 26.0–34.0)
MCHC: 32.5 g/dL (ref 30.0–36.0)
MCV: 83.3 fL (ref 80.0–100.0)
Platelets: 117 10*3/uL — ABNORMAL LOW (ref 150–400)
RBC: 5.69 MIL/uL (ref 4.22–5.81)
RDW: 15.9 % — ABNORMAL HIGH (ref 11.5–15.5)
WBC: 6.4 10*3/uL (ref 4.0–10.5)
nRBC: 0 % (ref 0.0–0.2)

## 2021-10-09 LAB — URINALYSIS, MICROSCOPIC (REFLEX)

## 2021-10-09 LAB — APTT: aPTT: 36 seconds (ref 24–36)

## 2021-10-09 LAB — RESP PANEL BY RT-PCR (FLU A&B, COVID) ARPGX2
Influenza A by PCR: NEGATIVE
Influenza B by PCR: NEGATIVE
SARS Coronavirus 2 by RT PCR: NEGATIVE

## 2021-10-09 LAB — PROTIME-INR
INR: 2 — ABNORMAL HIGH (ref 0.8–1.2)
INR: 1.3 — ABNORMAL HIGH (ref 0.8–1.2)
Prothrombin Time: 22.2 seconds — ABNORMAL HIGH (ref 11.4–15.2)
Prothrombin Time: 16.2 seconds — ABNORMAL HIGH (ref 11.4–15.2)

## 2021-10-09 LAB — ETHANOL: Alcohol, Ethyl (B): <10 mg/dL (ref <10)

## 2021-10-09 LAB — CBG MONITORING, ED: Glucose-Capillary: 189 mg/dL — ABNORMAL HIGH (ref 70–99)

## 2021-10-09 IMAGING — CT CT ANGIO HEAD
2 of 7 series · 8 of 33 positions shown · non-contrast
Comparison: None.

CLINICAL DATA: Neuro deficit, acute, stroke suspected. Acute
intraparenchymal hemorrhage at the left temporal occipital junction.

EXAM:
CT ANGIOGRAPHY HEAD AND NECK
TECHNIQUE: Multidetector CT imaging of the head and neck was performed using
the standard protocol during bolus administration of intravenous
contrast. Multiplanar CT image reconstructions and MIPs were
obtained to evaluate the vascular anatomy. Carotid stenosis
measurements (when applicable) are obtained utilizing NASCET
criteria, using the distal internal carotid diameter as the
denominator.

[Series 1: cta head/neck · axial · 0.51mm/px · z∈[-280,-156]mm · 2 of 186 slices shown]
[im 62/186  soft-tissue]
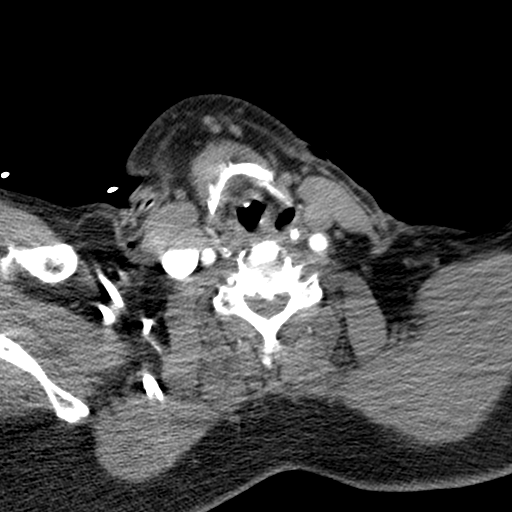
[im 124/186  soft-tissue]
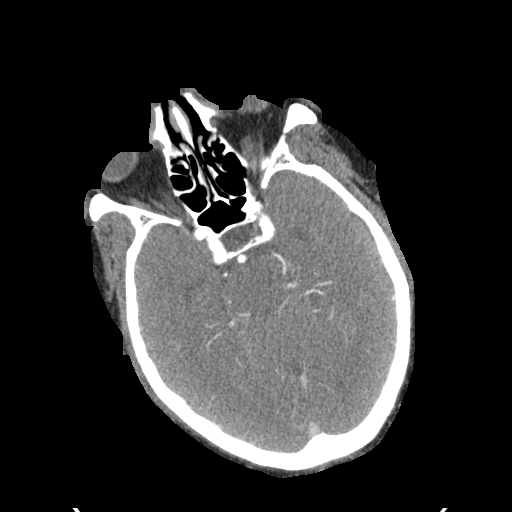

[Series 5: axial thin · axial · 0.47mm/px · z∈[-348,-82]mm · 6 of 375 slices shown]
[im 54/375  soft-tissue]
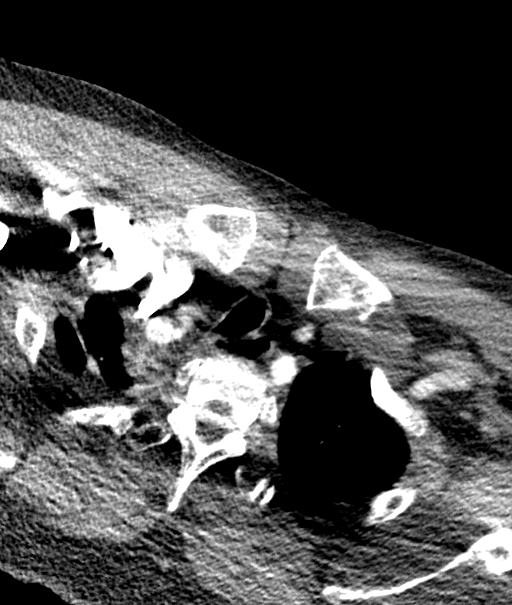
[im 107/375  bone]
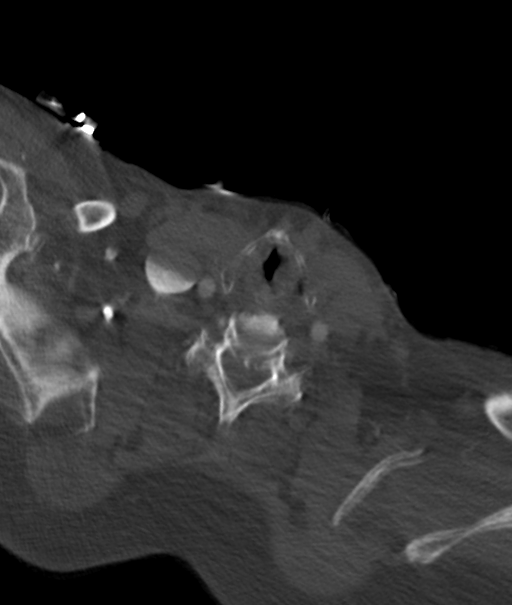
[im 161/375  soft-tissue]
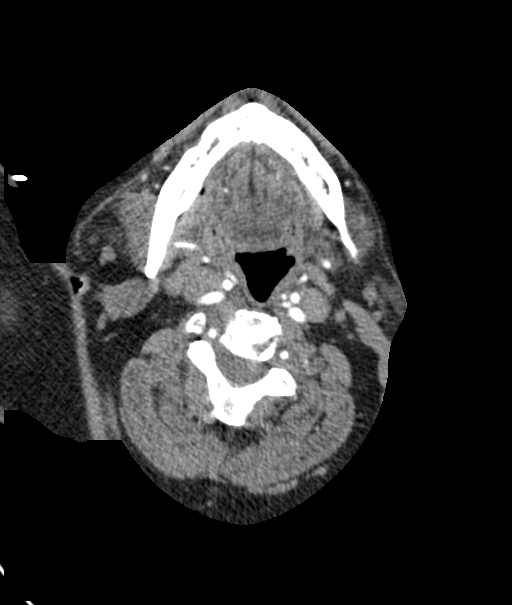
[im 214/375  bone]
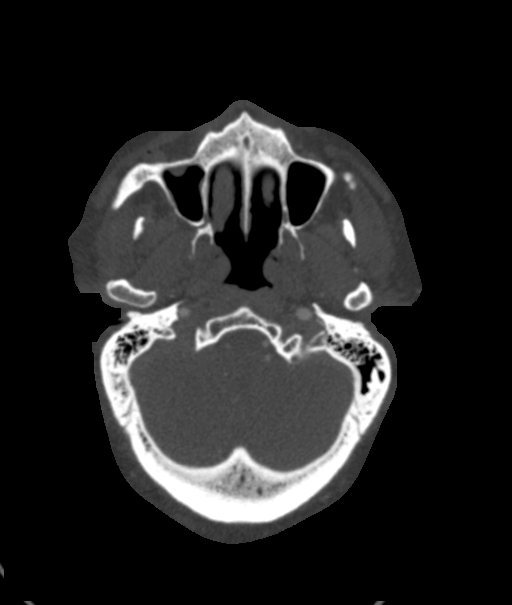
[im 268/375  soft-tissue]
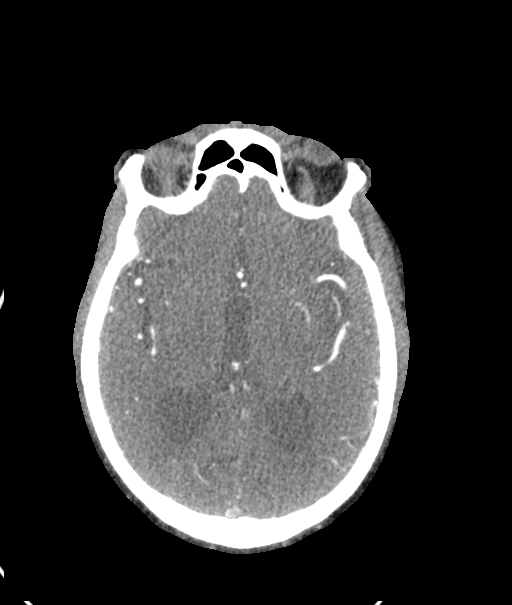
[im 321/375  bone]
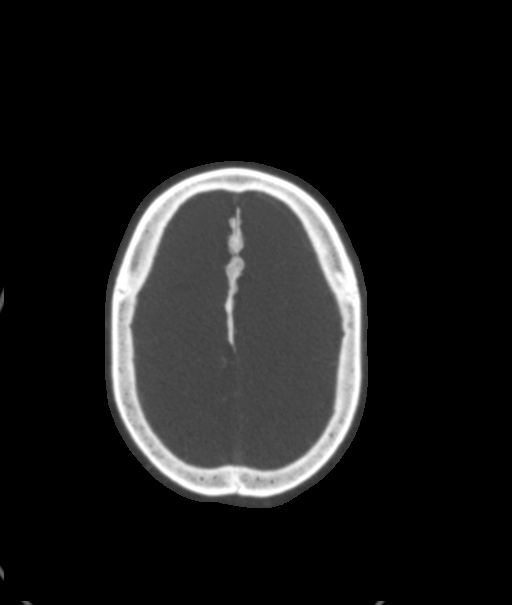

[8 of 33 positions shown; findings below may reference images not displayed]

RADIATION DOSE REDUCTION: This exam was performed according to the
departmental dose-optimization program which includes automated
exposure control, adjustment of the mA and/or kV according to
patient size and/or use of iterative reconstruction technique.

CONTRAST:  100mL OMNIPAQUE IOHEXOL 350 MG/ML SOLN
FINDINGS: CTA NECK FINDINGS

Aortic arch: Normal

Right carotid system: Common carotid artery widely patent to the
bifurcation. Carotid bifurcation is normal without soft or calcified
plaque. Cervical ICA is normal.

Left carotid system: Similarly normal.

Vertebral arteries: Vertebral artery origins are normal. Vessels are
normal through the cervical region to the foramen magnum.

Skeleton: Ordinary cervical spondylosis.

Other neck: No mass or lymphadenopathy.

Upper chest: Dilated esophagus.  Upper lungs are clear.

Review of the MIP images confirms the above findings

CTA HEAD FINDINGS

Anterior circulation: Both internal carotid arteries are patent
through the skull base and siphon regions. Minimal siphon
atherosclerotic calcification but no stenosis. The anterior and
middle cerebral vessels are normal. No large vessel occlusion or
proximal stenosis.

Posterior circulation: Both vertebral arteries are patent through
the foramen magnum to the basilar. No basilar stenosis. Posterior
circulation branch vessels are patent. No evidence of abnormal blood
vessel in the region of the left-sided hemorrhage.

Venous sinuses: Patent and normal.

Anatomic variants: None significant.

Review of the MIP images confirms the above findings
IMPRESSION: Negative CT angiography. No evidence of large vessel occlusion. No
evidence of stenotic disease either in the neck or intracranial
circulation. No evidence of abnormal blood vessel in the region of
the left temporal occipital junction intraparenchymal hemorrhage.

## 2021-10-09 IMAGING — CT CT HEAD CODE STROKE
3 series · 14 of 47 positions shown, 16 images · non-contrast
Comparison: [DATE]

CLINICAL DATA: Code stroke. Neuro deficit, acute, stroke suspected.



[Series 2: head wo · axial · 0.48mm/px · z∈[-193,-53]mm · 8 of 34 slices shown, 10 images]
[im 3/34  brain]
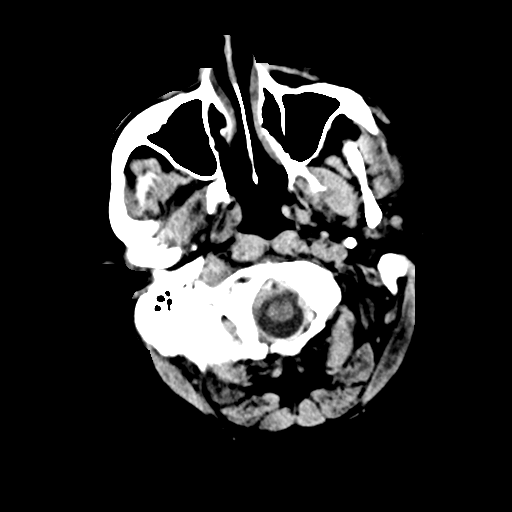
[im 3/34  bone]
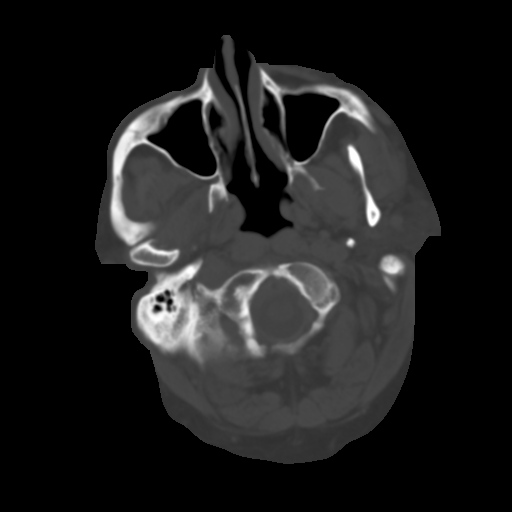
[im 7/34  brain]
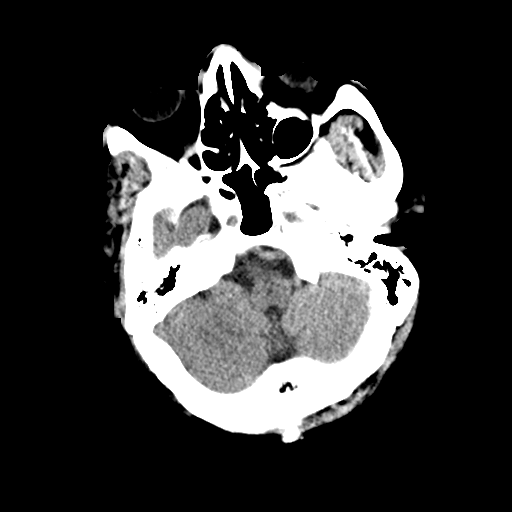
[im 11/34  brain]
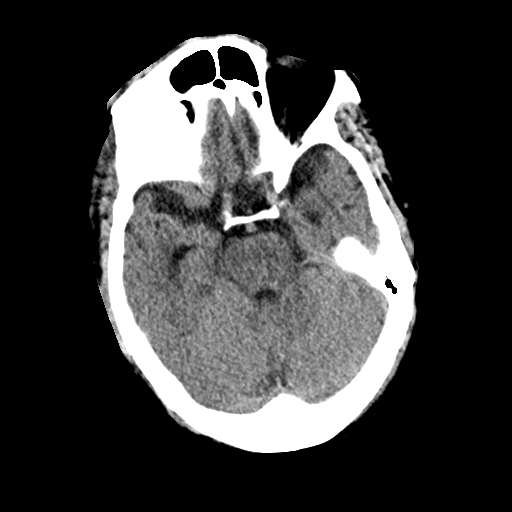
[im 15/34  brain]
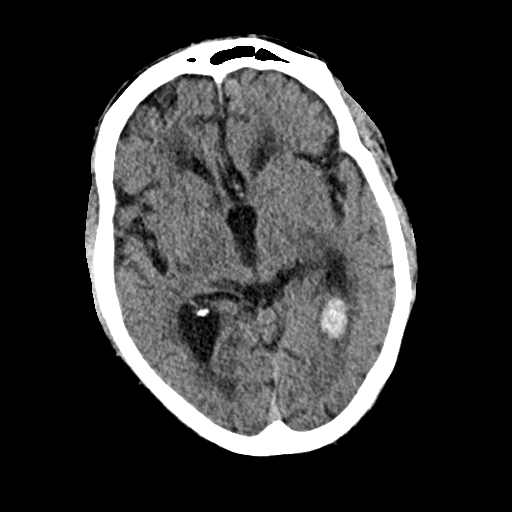
[im 19/34  brain]
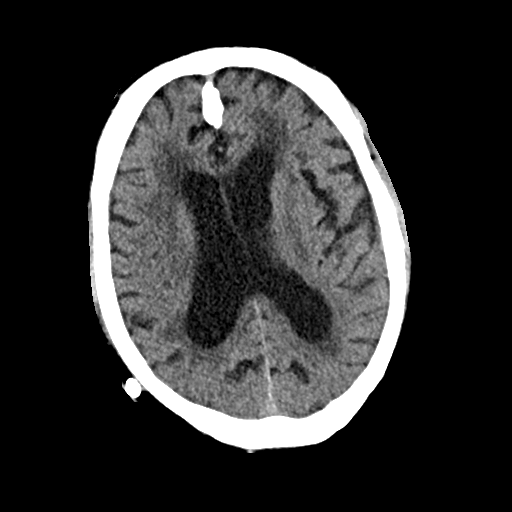
[im 19/34  bone]
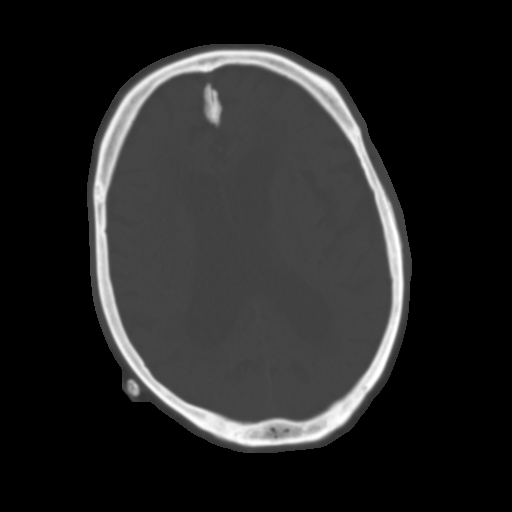
[im 23/34  brain]
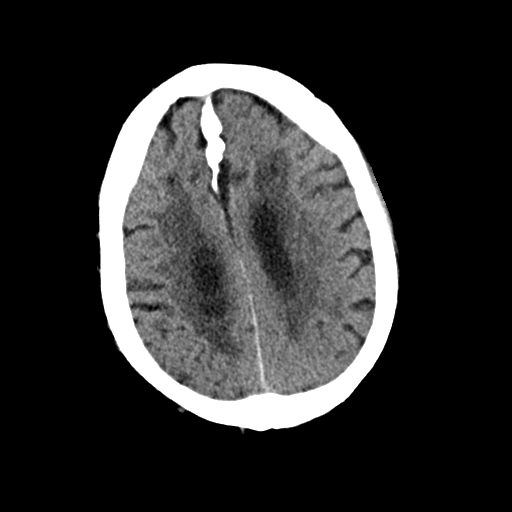
[im 27/34  brain]
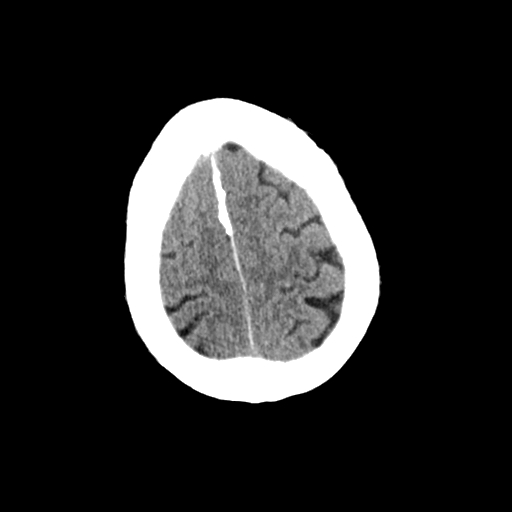
[im 31/34  brain]
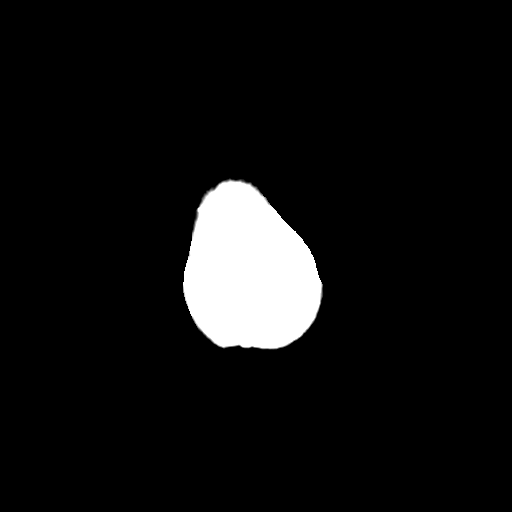

[Series 4: coronal soft · coronal · 0.34mm/px · 3 of 76 slices shown]
[im 26/76  brain]
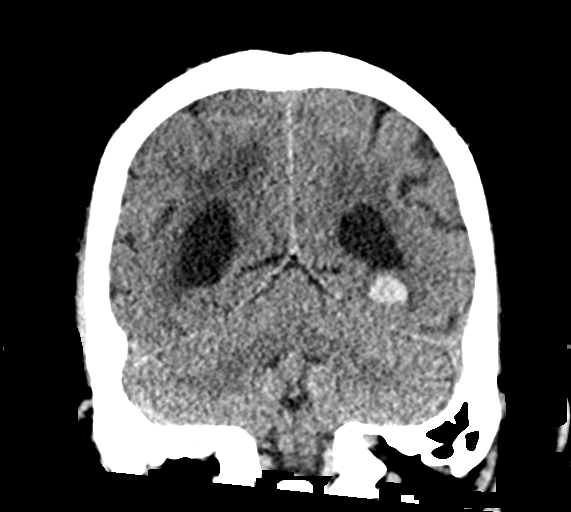
[im 34/76  brain]
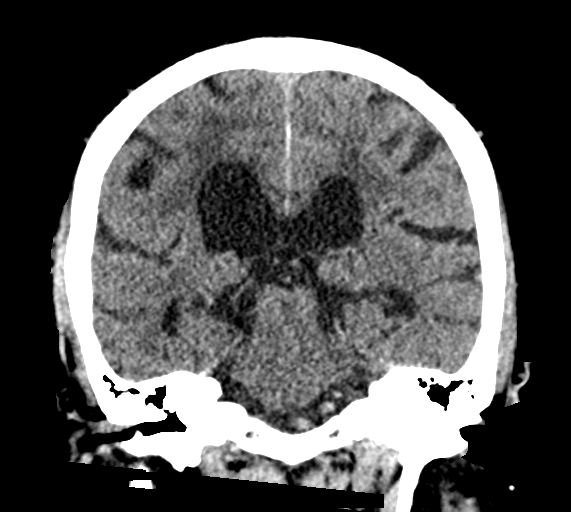
[im 42/76  brain]
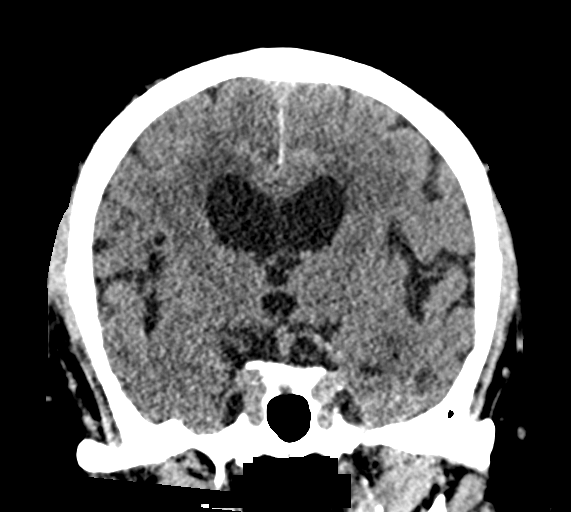

[Series 5: sag soft · sagittal · 0.32mm/px · 3 of 61 slices shown]
[im 21/61  brain]
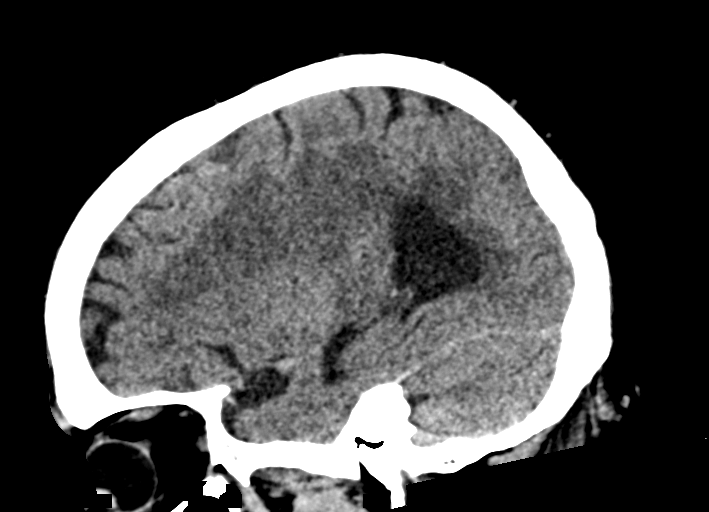
[im 31/61  brain]
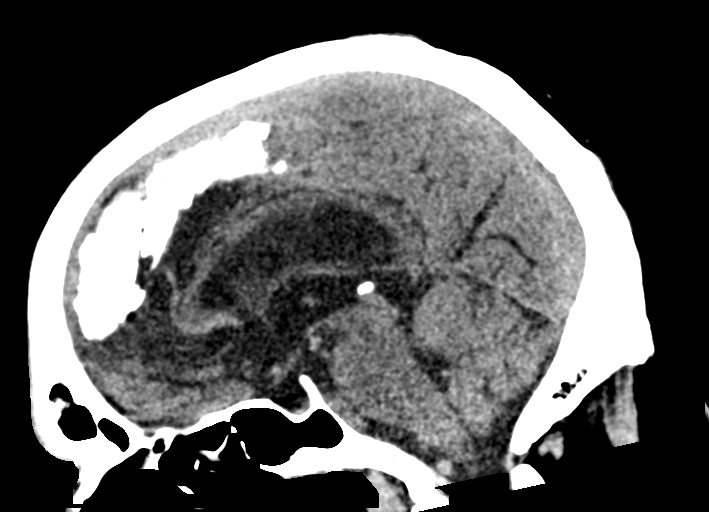
[im 41/61  brain]
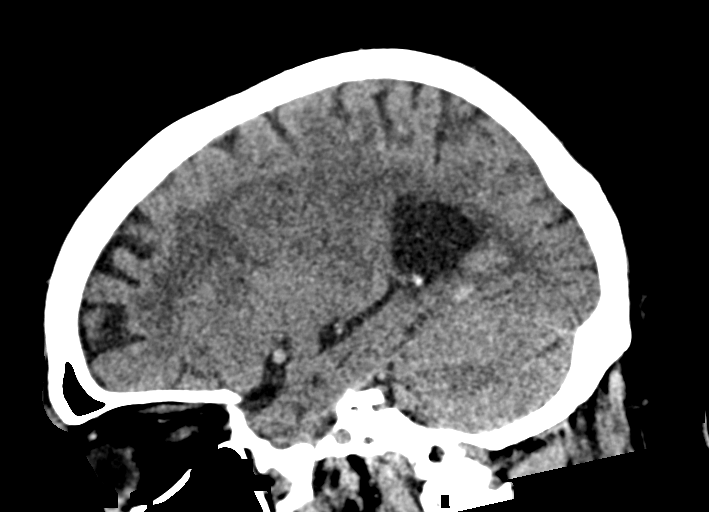

[14 of 47 positions shown; findings below may reference images not displayed]

FINDINGS: Brain: No abnormality affects the brainstem or cerebellum. Cerebral
hemispheres show atrophy with extensive chronic small-vessel
ischemic changes throughout the white matter. There is an acute
intraparenchymal hematoma within the left temporal occipital
junction, inferior to the atrium and occipital horn of the left
lateral ventricle, measuring 1.8 x 1.2 x 1.0 cm (volume =
cm^3) mild surrounding edema. This is most consistent with a
hemorrhagic infarction. Hemorrhagic metastasis is not excluded but
as a solitary lesion would be less likely. No hydrocephalus or
extra-axial collection.

Vascular: There is atherosclerotic calcification of the major
vessels at the base of the brain.

Skull: Negative

Sinuses/Orbits: Clear/normal

Other: None
IMPRESSION: 1. Background pattern of brain atrophy with extensive chronic
small-vessel ischemic changes. Acute intraparenchymal hematoma at
the left temporal occipital junction, just inferior to the atrium
and occipital horn of the left lateral ventricle, volume 1.1 cc.
This probably represents a hemorrhagic small vessel infarction.
Hemorrhagic metastasis is a consideration but is not felt to be the
most likely a explanation.
2. These results were called by telephone at the time of
interpretation on [DATE] at [DATE] to provider DRAGANA ,
who verbally acknowledged these results.

## 2021-10-09 IMAGING — CT CT ANGIO NECK
1 series · 12 of 14 positions shown · IV contrast (Omnipaque)
Comparison: None.

CLINICAL DATA: Neuro deficit, acute, stroke suspected. Acute
intraparenchymal hemorrhage at the left temporal occipital junction.

EXAM:
CT ANGIOGRAPHY HEAD AND NECK
TECHNIQUE: Multidetector CT imaging of the head and neck was performed using
the standard protocol during bolus administration of intravenous
contrast. Multiplanar CT image reconstructions and MIPs were
obtained to evaluate the vascular anatomy. Carotid stenosis
measurements (when applicable) are obtained utilizing NASCET
criteria, using the distal internal carotid diameter as the
denominator.

[Series 5: monitoring 10.0 b31s · axial · 0.39mm/px · z∈[-400,-400]mm · 12 of 22 slices shown]
[im 2/22  soft-tissue]
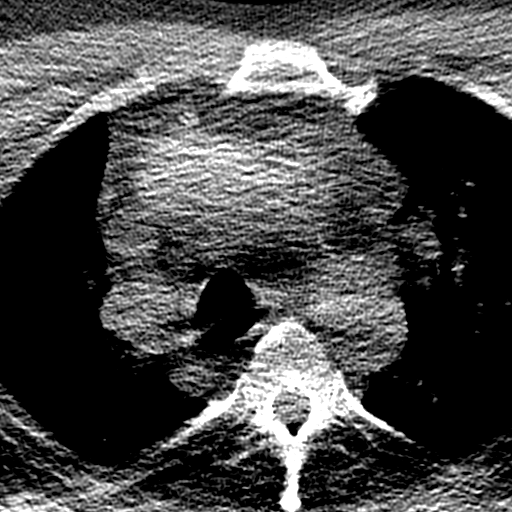
[im 4/22  bone]
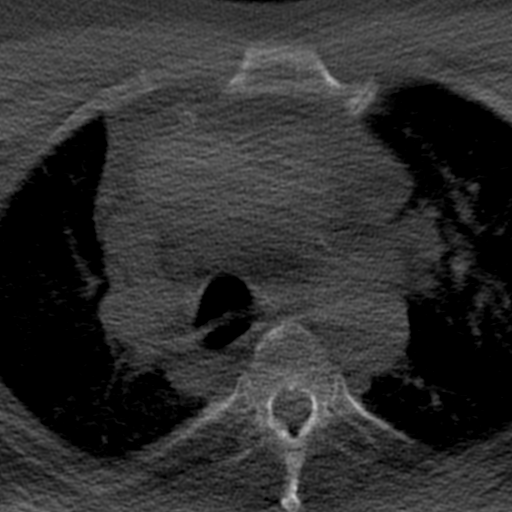
[im 5/22  soft-tissue]
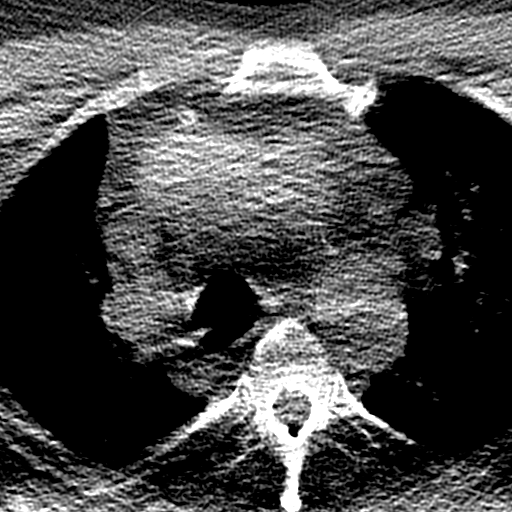
[im 7/22  bone]
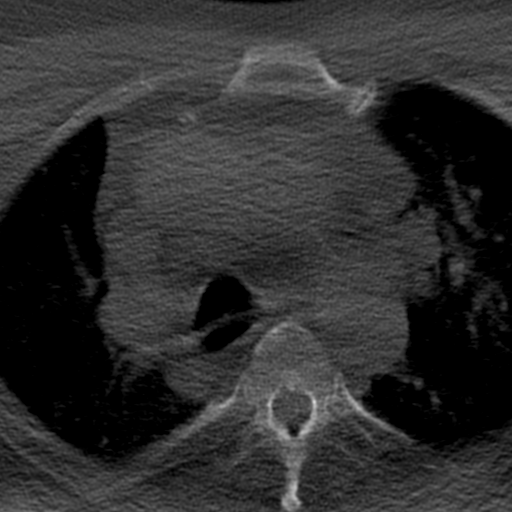
[im 9/22  soft-tissue]
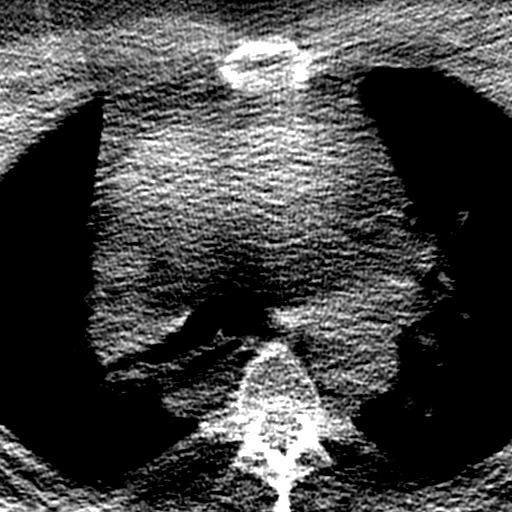
[im 10/22  bone]
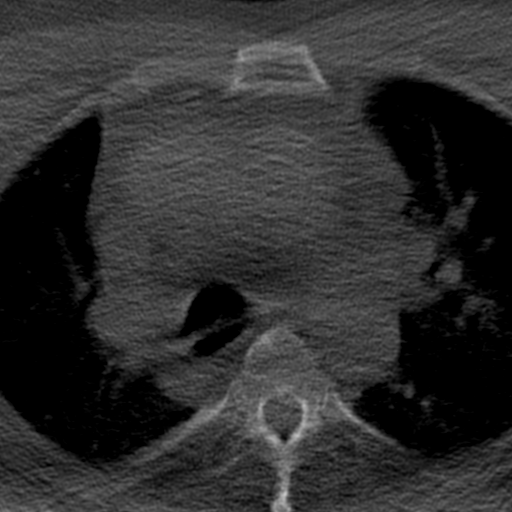
[im 12/22  soft-tissue]
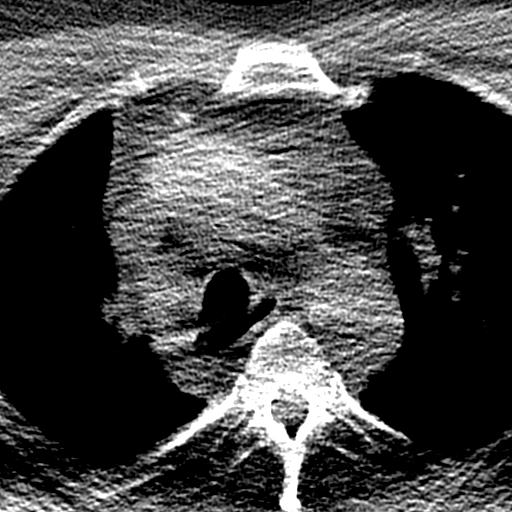
[im 13/22  bone]
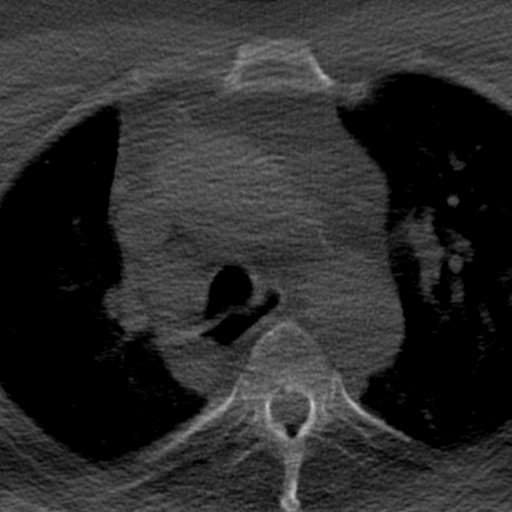
[im 15/22  soft-tissue]
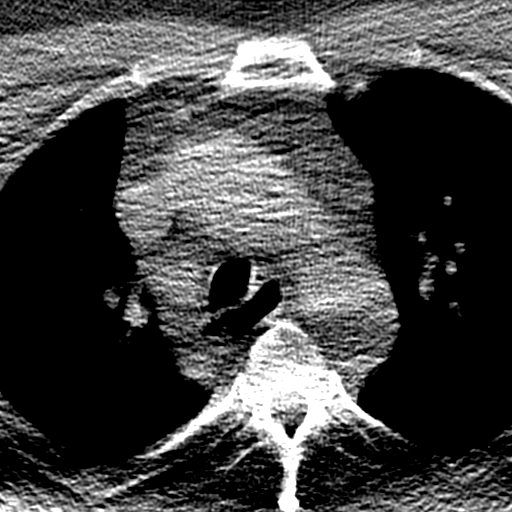
[im 17/22  bone]
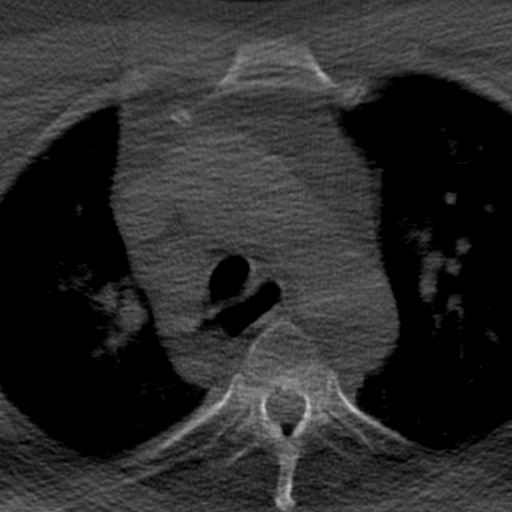
[im 18/22  soft-tissue]
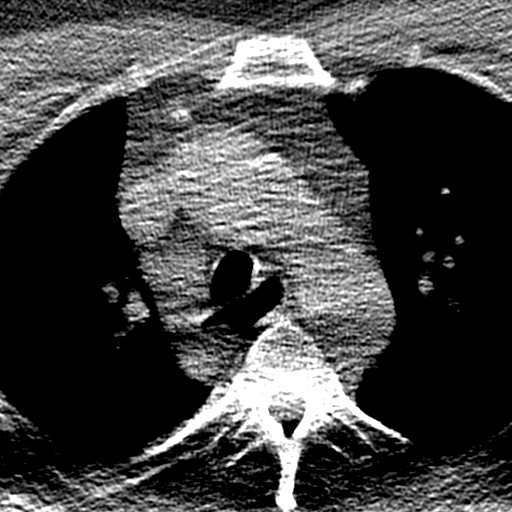
[im 20/22  bone]
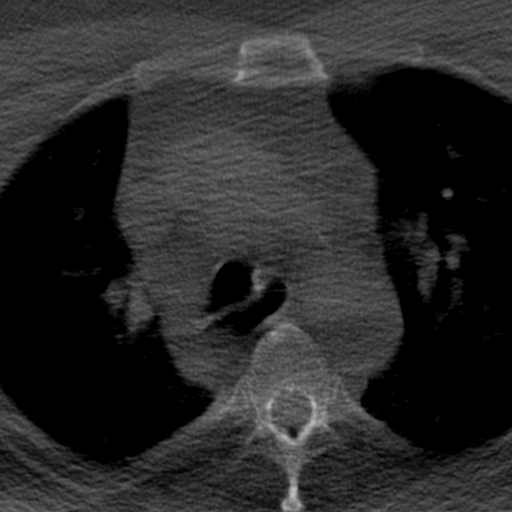

[12 of 14 positions shown; findings below may reference images not displayed]

RADIATION DOSE REDUCTION: This exam was performed according to the
departmental dose-optimization program which includes automated
exposure control, adjustment of the mA and/or kV according to
patient size and/or use of iterative reconstruction technique.

CONTRAST:  100mL OMNIPAQUE IOHEXOL 350 MG/ML SOLN
FINDINGS: CTA NECK FINDINGS

Aortic arch: Normal

Right carotid system: Common carotid artery widely patent to the
bifurcation. Carotid bifurcation is normal without soft or calcified
plaque. Cervical ICA is normal.

Left carotid system: Similarly normal.

Vertebral arteries: Vertebral artery origins are normal. Vessels are
normal through the cervical region to the foramen magnum.

Skeleton: Ordinary cervical spondylosis.

Other neck: No mass or lymphadenopathy.

Upper chest: Dilated esophagus.  Upper lungs are clear.

Review of the MIP images confirms the above findings

CTA HEAD FINDINGS

Anterior circulation: Both internal carotid arteries are patent
through the skull base and siphon regions. Minimal siphon
atherosclerotic calcification but no stenosis. The anterior and
middle cerebral vessels are normal. No large vessel occlusion or
proximal stenosis.

Posterior circulation: Both vertebral arteries are patent through
the foramen magnum to the basilar. No basilar stenosis. Posterior
circulation branch vessels are patent. No evidence of abnormal blood
vessel in the region of the left-sided hemorrhage.

Venous sinuses: Patent and normal.

Anatomic variants: None significant.

Review of the MIP images confirms the above findings
IMPRESSION: Negative CT angiography. No evidence of large vessel occlusion. No
evidence of stenotic disease either in the neck or intracranial
circulation. No evidence of abnormal blood vessel in the region of
the left temporal occipital junction intraparenchymal hemorrhage.

## 2021-10-09 MED ORDER — CHLORHEXIDINE GLUCONATE CLOTH 2 % EX PADS
6.0000 | MEDICATED_PAD | Freq: Every day | CUTANEOUS | Status: DC
  Administered 2021-10-10 – 2021-10-18 (×5): 6 via TOPICAL

## 2021-10-09 MED ORDER — ACETAMINOPHEN 650 MG RE SUPP: 650.0000 mg | RECTAL | Status: DC | PRN

## 2021-10-09 MED ORDER — SENNOSIDES-DOCUSATE SODIUM 8.6-50 MG PO TABS
1.0000 | ORAL_TABLET | Freq: Two times a day (BID) | ORAL | Status: DC
  Administered 2021-10-10 – 2021-10-18 (×17): 1 via ORAL
  Filled 2021-10-09 (×17): qty 1

## 2021-10-09 MED ORDER — LORAZEPAM 2 MG/ML IJ SOLN
1.0000 mg | Freq: Once | INTRAMUSCULAR | Status: AC
  Administered 2021-10-09: 1 mg via INTRAVENOUS

## 2021-10-09 MED ORDER — IOHEXOL 350 MG/ML SOLN
100.0000 mL | Freq: Once | INTRAVENOUS | Status: AC | PRN
  Administered 2021-10-09: 100 mL via INTRAVENOUS

## 2021-10-09 MED ORDER — LEVETIRACETAM IN NACL 500 MG/100ML IV SOLN
1500.0000 mg | Freq: Once | INTRAVENOUS | Status: AC
  Administered 2021-10-09: 1500 mg via INTRAVENOUS
  Filled 2021-10-09: qty 300

## 2021-10-09 MED ORDER — LORAZEPAM 2 MG/ML IJ SOLN
INTRAMUSCULAR | Status: AC
  Administered 2021-10-09: 1 mg
  Filled 2021-10-09: qty 1

## 2021-10-09 MED ORDER — CLEVIDIPINE BUTYRATE 0.5 MG/ML IV EMUL
0.0000 mg/h | INTRAVENOUS | Status: DC
  Administered 2021-10-09: 2 mg/h via INTRAVENOUS
  Administered 2021-10-10: 7 mg/h via INTRAVENOUS
  Administered 2021-10-10: 2 mg/h via INTRAVENOUS
  Administered 2021-10-11: 10 mg/h via INTRAVENOUS
  Administered 2021-10-11: 2 mg/h via INTRAVENOUS
  Filled 2021-10-09 (×5): qty 100

## 2021-10-09 MED ORDER — LABETALOL HCL 5 MG/ML IV SOLN
10.0000 mg | Freq: Once | INTRAVENOUS | Status: DC
Start: 2021-10-09 — End: 2021-10-14

## 2021-10-09 MED ORDER — LABETALOL HCL 5 MG/ML IV SOLN
10.0000 mg | Freq: Once | INTRAVENOUS | Status: AC
  Administered 2021-10-09: 10 mg via INTRAVENOUS

## 2021-10-09 MED ORDER — VITAMIN K1 10 MG/ML IJ SOLN
10.0000 mg | INTRAVENOUS | Status: AC
  Administered 2021-10-09: 10 mg via INTRAVENOUS
  Filled 2021-10-09: qty 1

## 2021-10-09 MED ORDER — VITAMIN K1 10 MG/ML IJ SOLN
INTRAMUSCULAR | Status: AC
  Filled 2021-10-09: qty 1

## 2021-10-09 MED ORDER — LABETALOL HCL 5 MG/ML IV SOLN
INTRAVENOUS | Status: AC
  Administered 2021-10-09: 10 mg
  Filled 2021-10-09: qty 4

## 2021-10-09 MED ORDER — STROKE: EARLY STAGES OF RECOVERY BOOK
Freq: Once | Status: AC
  Filled 2021-10-09: qty 1

## 2021-10-09 MED ORDER — ACETAMINOPHEN 325 MG PO TABS
650.0000 mg | ORAL_TABLET | ORAL | Status: DC | PRN
  Administered 2021-10-11 – 2021-10-13 (×6): 650 mg via ORAL
  Filled 2021-10-09 (×6): qty 2

## 2021-10-09 MED ORDER — PANTOPRAZOLE SODIUM 40 MG IV SOLR
40.0000 mg | Freq: Every day | INTRAVENOUS | Status: DC
  Administered 2021-10-10: 40 mg via INTRAVENOUS
  Filled 2021-10-09: qty 10

## 2021-10-09 MED ORDER — INSULIN ASPART 100 UNIT/ML IJ SOLN
0.0000 [IU] | Freq: Three times a day (TID) | INTRAMUSCULAR | Status: DC
  Administered 2021-10-12 – 2021-10-17 (×10): 1 [IU] via SUBCUTANEOUS

## 2021-10-09 MED ORDER — PROTHROMBIN COMPLEX CONC HUMAN 500 UNITS IV KIT
2223.0000 [IU] | PACK | Status: AC
  Administered 2021-10-09: 2223 [IU] via INTRAVENOUS
  Filled 2021-10-09: qty 2223

## 2021-10-09 MED ORDER — ACETAMINOPHEN 160 MG/5ML PO SOLN: 650.0000 mg | ORAL | Status: DC | PRN

## 2021-10-09 NOTE — ED Notes (Signed)
CBG 189. 

## 2021-10-09 NOTE — ED Notes (Signed)
Pt given 1 mg ativan for seizure lasting for approx 20-45 sec ?

## 2021-10-09 NOTE — ED Notes (Signed)
Delay in triage, pt difficult to direct into bed, unable to answer questions. Waiting for sister to park car and come to room for history. ? ?Confusion, generalized weakness, right sided neglect noted. Unable to answer orientation questions. Called for doctor to come to room to assess. ?

## 2021-10-09 NOTE — H&P (Signed)
Neurology Consultation ? ?CC: AMS ? ? ?History is obtained from: Chart ? ?HPI: Steven Barnett is a 71 y.o. male PMH of Afib on coumadin, colon cancer, Diastolic CHF, HTN, OSA, came in to [facility] for evaluation of AMS. ?Last known well presumably 12 PM when he felt acutely weak.  Family took him to lunch but he did not seem to be as interactive as his normal self.  He then became incontinent of urine and had some shaking of his left arm.  Patient unable to provide reliable history.  Brought into [facility] where he was seen by telemedicine neurology.  Hypertensive with blood pressures in the 160s.  CT head with acute small intraparenchymal hematoma within the left temporal occipital junction just inferior to the atrium and occipital horn of the left lateral ventricle.  No frank IVH.  CTA head and neck negative for underlying vascular malformation.  While did not a neurologist was examining the patient he had a witnessed complex partial seizure involving the right hemibody and rightward gaze deviation. ?He was given benzos and loaded with Keppra.  Coumadin was reversed ?Transferred to [facility] for further management.  Currently requiring Cleviprex for blood pressure control ? ? ?LKW: 12 PM 10/09/2021 ?tpa given?: no, ICH ?Premorbid modified Rankin scale (mRS): Unable to ascertain ?ICH Score: 0 ? ? ?ROS: Full ROS was performed and is negative except as noted in the HPI. ? ?Past Medical History:  ?Diagnosis Date  ? Anemia   ? Arthralgia of both knees   ? Arthritis   ? "right knee" (10/01/2015)  ? Atrial fibrillation ([place])   ? Cancer [facility])   ? STAGE 1 COLON CANCER  ? Chronic anticoagulation 2010  ? Coumadin  ? Chronic diastolic CHF (congestive heart failure), NYHA class 2 ([place])   ? Dyspnea   ? Dysrhythmia   ? Hypertension   ? OSA (obstructive sleep apnea) 2016  ? "couldn't take the mask during the testing" (10/01/2015)  ? Permanent atrial fibrillation ([place]) 2010  ? Pneumonia 3/29/2017and june 2017   ? ?Family History  ?Problem Relation Age of Onset  ? Heart disease Father   ? Heart failure Father   ? Colon polyps Father   ?     unspecified number - pt of intestine surgically resected  ? Hypertension Mother   ? Diabetes Mother   ? Hyperlipidemia Mother   ? Colon polyps Mother   ?     unspecified number - pt of intestine surgically resected  ? Dementia Mother   ?     d. 43  ? Stroke Maternal Grandmother   ? Stroke Sister   ? Colon cancer Sister 57  ?     w/ "cancerous polyps" - unspecified number; underwent surgery and radiation  ? Colon polyps Brother   ?     unspecified number - polypectomies  ? Colon cancer Sister   ?     dx 12-60; s/p surgery and radiation  ? Bone cancer Maternal Uncle   ?     dx. 67s  ? Lung cancer Paternal Grandfather   ?     d. 80s-90s; lung cancer vs TB  ? Colon cancer Sister   ?     dx. 61-60; s/p surgery and radiation  ? Cancer Maternal Uncle   ?     mother had about 7-8 other siblings, most passed at older ages, many of whom had some type of cancer  ? Heart attack Neg Hx   ? ?  Social History:  ? reports that he quit smoking about 46 years ago. His smoking use included cigarettes. He has a 0.20 pack-year smoking history. He has never used smokeless tobacco. He reports that he does not drink alcohol and does not use drugs. ? ?Medications ? ?Current Facility-Administered Medications:  ?  [START ON 10/10/2021]  stroke: mapping our early stages of recovery book, , Does not apply, Once, Amie Portland, MD ?  acetaminophen (TYLENOL) tablet 650 mg, 650 mg, Oral, Q4H PRN **OR** acetaminophen (TYLENOL) 160 MG/5ML solution 650 mg, 650 mg, Per Tube, Q4H PRN **OR** acetaminophen (TYLENOL) suppository 650 mg, 650 mg, Rectal, Q4H PRN, Amie Portland, MD ?  Derrill Memo ON 10/10/2021] Chlorhexidine Gluconate Cloth 2 % PADS 6 each, 6 each, Topical, Daily, Amie Portland, MD ?  clevidipine (CLEVIPREX) infusion 0.5 mg/mL, 0-21 mg/hr, Intravenous, Continuous, Hayden Rasmussen, MD, Last Rate: 12 mL/hr at 10/09/21  2048, 6 mg/hr at 10/09/21 2048 ?  labetalol (NORMODYNE) injection 10 mg, 10 mg, Intravenous, Once, Dhadwal, Neetu, MD ?  [START ON 10/10/2021] pantoprazole (PROTONIX) injection 40 mg, 40 mg, Intravenous, QHS, Amie Portland, MD ?  phytonadione (VITAMIN K) 10 MG/ML SQ injection, , , ,  ?  [START ON 10/10/2021] senna-docusate (Senokot-S) tablet 1 tablet, 1 tablet, Oral, BID, Amie Portland, MD ? ?Facility-Administered Medications Ordered in Other Encounters:  ?  heparin lock flush 100 unit/mL, 500 Units, Intravenous, Once PRN, [name], MD ?  sodium chloride flush (NS) 0.9 % injection 10 mL, 10 mL, Intravenous, PRN, [name], MD ? ? ?Exam: ?Current vital signs: ?BP 135/79   Pulse 79   Temp 97.7 ?F (36.5 ?C) (Oral)   Resp (!) 23   SpO2 99%  ?Vital signs in last 24 hours: ?Temp:  [97.7 ?F (36.5 ?C)] 97.7 ?F (36.5 ?C) (04/08 1650) ?Pulse Rate:  [64-88] 79 (04/08 2233) ?Resp:  [12-28] 23 (04/08 2233) ?BP: (123-175)/(79-121) 135/79 (04/08 2233) ?SpO2:  [89 %-100 %] 99 % (04/08 2233) ? ?GENERAL: Awake, alert in NAD ?HEENT: - Normocephalic and atraumatic, dry mm, no LN++, no Thyromegally ?LUNGS - Clear to auscultation bilaterally with no wheezes ?CV - S1S2 RRR, no m/r/g, equal pulses bilaterally. ?ABDOMEN - Soft, nontender, nondistended with normoactive BS ?Ext: warm, well perfused, intact peripheral pulses, no edema ? ?NEURO:  ?Mental Status: AA&Ox2.  Reduced attention concentration ?Language: speech is mildly dysarthric.  Naming, repetition, fluency, and comprehension intact. ?Cranial Nerves: PERRL. EOMI, visual fields full, no facial asymmetry, facial sensation intact, hearing intact, tongue/uvula/soft palate midline, normal sternocleidomastoid and trapezius muscle strength. No evidence of tongue atrophy or fibrillations ?Motor: Upper extremities with no drift.  Left lower extremity with vertical drift.  Right lower extremity with no drift ?Tone: is normal and bulk is normal ?Sensation- Intact to light touch  bilaterally ?Coordination: FTN intact bilaterally, difficulty performing lower extremities ?Gait- deferred ? ?NIHSS ?1a Level of Conscious.: 0 ?1b LOC Questions: 1 ?1c LOC Commands: 0 ?2 Best Gaze: 0 ?3 Visual: 0 ?4 Facial Palsy: 0 ?5a Motor Arm - left: 0 ?5b Motor Arm - Right: 0 ?6a Motor Leg - Left: 1 ?6b Motor Leg - Right: 0 ?7 Limb Ataxia: 0 ?8 Sensory: 0 ?9 Best Language: 0 ?10 Dysarthria: 1 ?11 Extinct. and Inatten.: 0 ?TOTAL: 3 ? ? ? ?Labs ?I have reviewed labs in epic and the results pertinent to this consultation are: ? ?CBC ?   ?Component Value Date/Time  ? WBC 6.4 10/09/2021 1646  ? RBC 5.69 10/09/2021 1646  ? HGB 15.4  10/09/2021 1646  ? HGB 15.5 08/10/2017 1004  ? HGB 15.8 06/05/2017 1343  ? HCT 47.4 10/09/2021 1646  ? HCT 47.3 08/10/2017 1004  ? HCT 48.7 06/05/2017 1343  ? PLT 117 (L) 10/09/2021 1646  ? PLT 742 (H) 08/10/2017 1004  ? MCV 83.3 10/09/2021 1646  ? MCV 83 08/10/2017 1004  ? MCV 83.5 06/05/2017 1343  ? MCH 27.1 10/09/2021 1646  ? MCHC 32.5 10/09/2021 1646  ? RDW 15.9 (H) 10/09/2021 1646  ? RDW 15.5 (H) 08/10/2017 1004  ? RDW 15.5 (H) 06/05/2017 1343  ? LYMPHSABS 0.5 (L) 10/09/2021 1646  ? LYMPHSABS 0.9 06/05/2017 1343  ? MONOABS 0.4 10/09/2021 1646  ? MONOABS 1.0 (H) 06/05/2017 1343  ? EOSABS 0.1 10/09/2021 1646  ? EOSABS 0.2 06/05/2017 1343  ? BASOSABS 0.0 10/09/2021 1646  ? BASOSABS 0.0 06/05/2017 1343  ? ? ?CMP  ?   ?Component Value Date/Time  ? NA 134 (L) 10/09/2021 1646  ? NA 139 11/13/2019 1619  ? NA 139 06/05/2017 1343  ? K 3.7 10/09/2021 1646  ? K 4.9 06/05/2017 1343  ? CL 98 10/09/2021 1646  ? CO2 30 10/09/2021 1646  ? CO2 30 (H) 06/05/2017 1343  ? GLUCOSE 204 (H) 10/09/2021 1646  ? GLUCOSE 85 06/05/2017 1343  ? BUN 13 10/09/2021 1646  ? BUN 19 11/13/2019 1619  ? BUN 19.3 06/05/2017 1343  ? CREATININE 1.04 10/09/2021 1646  ? CREATININE 1.2 06/05/2017 1343  ? CALCIUM 8.8 (L) 10/09/2021 1646  ? CALCIUM 9.2 06/05/2017 1343  ? PROT 8.3 (H) 10/09/2021 1646  ? PROT 7.3 08/10/2017 1004  ?  PROT 7.8 06/05/2017 1343  ? ALBUMIN 4.1 10/09/2021 1646  ? ALBUMIN 4.1 08/10/2017 1004  ? ALBUMIN 3.7 06/05/2017 1343  ? AST 22 10/09/2021 1646  ? AST 23 06/05/2017 1343  ? ALT 15 10/09/2021 1646  ? ALT 18 06/05/2017 1343  ? ALKPH

## 2021-10-09 NOTE — ED Triage Notes (Signed)
At 1200 pt began to say he felt "unwell". Went to lunch and was not acting himself, wouldn't eat, seem confused. Was in the car and at around 1530 and his arms were shaking. Urinary incontinence during triage.  ? ?Wife passed away in 2023-06-21, has been struggling mentally. ?

## 2021-10-09 NOTE — ED Notes (Signed)
Provider at bedside

## 2021-10-09 NOTE — Consult Note (Signed)
[facility] ?[facility] Consult [facility] ? ? ?Patient Name:   Steven Barnett, Steven Barnett. ?Date of Birth:   23-Jan-1951 ?Identification Number:   MRN - [id number] ?Date of Service:   10/09/2021 16:50:17 ? ?Diagnosis: ?      I61.9 - Intracerebral haemorrhage, unspecified ?      R56.9 - Seizures ? ?Impression: ?Patient is a 71 yo male with a PMH of Afib on coumadin, gout, CHF who p/w AMS, followed by shaking of the RUE with incontinence. LNK per patients sister at bedside 12:00 noon. Hypertensive on arrival with BP 160/104. Durin my consulation witnessed partial complex seizure involving right body, right gaze deviation. CTH revealed an acute intraparenchymal hematoma within the left temporal occipital junction, just inferior to the atrium and occipital horn of the left lateral ventricle, measuring 1.8 x 1.2 x 1.0 cm mild surrounding edema. No frank IVH per radiology. CTA head and neck pending to r/o superimposed LVO, acute vascular pathology (including AVM, aneurysm). Unclear etiology of acute ICH, atypical location for primary hypertensive hemorrhage, more in a watershed vascular territory. Differential includes underlying lesion or associated vascular malformation. Given ICH, thrombolytic therapy is not indicated. ? ?Addendum: ?CTA head and neck: Negative CT angiography. No evidence of large vessel occlusion. No evidence of stenotic disease either in the neck or intracranial ?circulation. No evidence of abnormal blood vessel in the region of the left temporal occipital junction intraparenchymal hemorrhage. ?Emergent NIR not indicated.  ? ?Recommendation: ? ?Diagnostic Studies: ?     Repeat CT head in first 8-12hrs ?     CTA head and neck with contrast ?    - routine EEG, clinical seizures have subsided ? ?Laboratory Studies: ?      INR/PT ?      aPTT? ?      CBC ? ?Medications: ?      Hold?antiplatelet?therapy/NSAIDS/Anticoagulation ?      Warfarin/Coumadin/DOAC reversal per hospital protocol, consider  Vit K for goal INR <1.3 ?      Load with Keppra 1.5 gm now. ?      Keppra '500mg'$  bid. ? ?Nursing Recommendations: ?      Telemetry, IV Fluids?Avoid dextrose containing fluids, Maintain euglycemia ?      Head of bed 30 degrees ?      Neuro checks q1-2?hrs?during ICU stay ?      Once stable neuro checks q4?hrs ?      keep BP less than 140/90's with goal of 130/80s ? ?Consultations: ?      Consider NES consult given ICH with AC usage, however, unlikely surgical intervention is indicated at this time ?      Recommend Speech therapy if failed dysphagia screen ?      Physical therapy/Occupational therapy ? ?DVT Prophylaxis: ?      SCDs ? ?Disposition: ?      Neurology will Follow ?Rec d/w family at bedside ?Case d/w ED MD  ? ?------------------------------------------------------------------------------ ? ?Metrics: ?Last Known Well: 10/09/2021 12:00:00 ?[facility] Notification Time: 10/09/2021 16:49:21 ?Arrival Time: 10/09/2021 16:15:00 ?Stamp Time: 10/09/2021 16:50:17 ?Initial Response Time: 10/09/2021 16:52:35 ?Symptoms: AMS, right arm shaking. ?NIHSS Start Assessment Time: 10/09/2021 17:05:00 ?Patient is not a candidate for Thrombolytic. ?Thrombolytic Medical Decision: 10/09/2021 17:00:20 ?Patient was not deemed candidate for Thrombolytic because of following reasons: ?Last Well Known Above 4.5 Hours. ?Current or Previous ICH. ? ?I personally Reviewed the CT Head : There is an acute ?intraparenchymal hematoma within the left temporal occipital ?junction, inferior to the atrium and occipital horn of  the left ?lateral ventricle, measuring 1.8 x 1.2 x 1.0 cm (volume = 1.1 ?cm^3) mild surrounding edema. ? ?ED Physician notified of diagnostic impression and management plan on 10/09/2021 17:31:08 ? ? ? ?History of Present Illness: ?Patient is a 71 year old Male. ? ?Patient was brought by private transportation with symptoms of AMS, right arm shaking. ?Patient is a 71 yo male with a PMH of Afib on coumadin, gout, CHF who  p/w AMS, followed by shaking of the RUE with incontinence. LNK per patients sister at bedside 12:00 noon. ?At noon patient was with his family, he was alert and normal at that time. Able to communicate and respond normally. Shortly after that, he became increasingly altered. He was staring, not responding verbally to family. He could not speak normally, slowed, hesitant speech. While driving him to urgent care he had right arm shaking. His family became concerned he was having a stroke and brought him to the ED. During my exam, patient had seizure activity as noted below. ?  ? ?Past Medical History: ?     Atrial Fibrillation ?     There is no history of Stroke ?     There is no history of Seizures ? ?Medications: ? ?Anticoagulant use:  Yes Coumadin ?Antiplatelet use: Yes ASA ?Reviewed EMR for current medications ? ?Allergies:  ? ?Description: reviewed in EMR ?Allergies Unable To Obtain Due To: Patient Cannot Speak ? ?Social History: ?Unable To Obtain Due To Patient Status : Patient Cannot Speak ? ?Family History: ? ?Family History Cannot Be Obtained Because:Patient Cannot Speak ?There is no family history of premature cerebrovascular disease pertinent to this consultation ? ?ROS : ?14 Points Review of Systems was performed and was negative except mentioned in HPI. ?ROS Cannot Be Obtained Because:  Patient Cannot Speak ? ?Past Surgical History: ?Past Surgical History Cannot Be Obtained Because: Patient Cannot Speak ?There Is No Surgical History Contributory To Today?s Visit ? ?  ? ?Examination: ?BP(160//104), Pulse(78), ?1A: Level of Consciousness - Arouses to minor stimulation + 1 ?1B: Ask Month and Age - Could Not Answer Either Question Correctly + 2 ?1C: Blink Eyes & Squeeze Hands - Performs 0 Tasks + 2 ?2: Test Horizontal Extraocular Movements - Partial Gaze Palsy: Corrects with Oculocephalic Reflex + 1 ?3: Test Visual Fields - No Visual Loss + 0 ?4: Test Facial Palsy (Use Grimace if Obtunded) - Minor paralysis  (flat nasolabial fold, smile asymmetry) + 1 ?5A: Test Left Arm Motor Drift - No Drift for 10 Seconds + 0 ?5B: Test Right Arm Motor Drift - Drift, hits bed + 2 ?6A: Test Left Leg Motor Drift - Some Effort Against Gravity + 2 ?6B: Test Right Leg Motor Drift - Some Effort Against Gravity + 2 ?7: Test Limb Ataxia (FNF/Heel-Shin) - No Ataxia + 0 ?8: Test Sensation - Normal; No sensory loss + 0 ?9: Test Language/Aphasia - Severe Aphasia: Fragmentary Expression, Inference Needed, Cannot Identify Materials + 2 ?10: Test Dysarthria - Mild-Moderate Dysarthria: Slurring but can be understood + 1 ?11: Test Extinction/Inattention - No abnormality + 0 ?NIHSS Score: 16 ? ?ICH Score: 1 ? ?NIHSS Text : At the time of exam, upon return from CT patient noted to have a right gaze deviation, followed by intermittent jerking of his RUE, trunk with associated lipsmacking type movements. Tachycardic from 70s to 120s. Event lasted 1-2 minutes. Spontaneously resolved, ativan '1mg'$  given. He remained post ictal with right sided deficits subsequently ? ? ?GlasGow Coma Score: ?5-12 (+1) ? ?Age >=  80: ?No (0) ? ?ICH volume >= 67m: ?No (0) ? ?Intraventricular hemorrhage: ?No (0) ? ?Infratentorial origin of hemorrhage: ?No (0) ? ?Pre-Morbid Modified Rankin Scale: ?3 Points = Moderate disability; requiring some help, but able to walk without assistance ? ? ?Patient/Family was informed the Neurology Consult would occur via TeleHealth consult by way of interactive audio and video telecommunications and consented to receiving care in this manner. ? ? ?Dr NRuffin Frederick? ?[facility] ?[phone]?Case [id number]?  ?

## 2021-10-09 NOTE — ED Notes (Signed)
Pt able to tell me his name now and follow more commands than on arrival , still not oriented to time and place,  ?

## 2021-10-09 NOTE — ED Provider Notes (Signed)
CBC with ?[facility] ?Provider Note ? ? ?CSN: [id number] ?Arrival date & time: 10/09/21  1615 ? ?  ? ?History ? ?Chief Complaint  ?Patient presents with  ? Altered Mental Status  ? ? ?Steven Barnett is a 71 y.o. male.  He has a history of A-fib on Coumadin.  He was well up until 12 PM when he acutely felt weak.  Family took him to lunch but he did not seem to be as interactive as normal.  He was incontinent of urine.  Had some shaking of his left arm.  Patient himself cannot give any reliable history, level 5 caveat secondary to altered mental status. ? ?The history is provided by the patient and a relative.  ?Altered Mental Status ?Presenting symptoms: behavior changes, confusion and disorientation   ?Most recent episode:  Today ?Episode history:  Continuous ?Duration:  5 hours ?Timing:  Constant ?Progression:  Unchanged ?Chronicity:  New ? ?  ? ?Home Medications ?Prior to Admission medications   ?Medication Sig Start Date End Date Taking? Authorizing Provider  ?acetaminophen (TYLENOL) 500 MG tablet Take 1,000 mg by mouth 2 (two) times daily.    [provider]  ?albuterol (PROVENTIL HFA;VENTOLIN HFA) 108 (90 Base) MCG/ACT inhaler Inhale 2 puffs into the lungs every 6 (six) hours as needed for wheezing or shortness of breath. 10/01/15   Nita Sells, MD  ?aspirin EC 81 MG tablet Take 81 mg by mouth daily.    [provider]  ?diltiazem (CARDIZEM) 120 MG tablet TAKE 1 TABLET BY MOUTH ONCE DAILY 12/13/17   Hilty, [name], MD  ?furosemide (LASIX) 40 MG tablet TAKE 1 TABLET EVERY DAY 06/23/21   Loel Dubonnet, NP  ?HYDROcodone-acetaminophen (NORCO/VICODIN) 5-325 MG tablet Take 1 tablet by mouth every 4 (four) hours as needed. 04/14/21   Isla Pence, MD  ?MITIGARE 0.6 MG CAPS Take 0.6 mg by mouth 2 (two) times daily. 11/07/19   [provider]  ?pantoprazole (PROTONIX) 40 MG tablet Take 1 tablet (40 mg total) by mouth daily. 01/16/21   Thurnell Lose,  MD  ?potassium chloride (KLOR-CON) 10 MEQ tablet Take 1 tablet (10 mEq total) by mouth daily. 09/23/21   Loel Dubonnet, NP  ?predniSONE (STERAPRED UNI-PAK 21 TAB) 10 MG (21) TBPK tablet Take by mouth daily. Take 6 tabs by mouth daily  for 2 days, then 5 tabs for 2 days, then 4 tabs for 2 days, then 3 tabs for 2 days, 2 tabs for 2 days, then 1 tab by mouth daily for 2 days 04/14/21   Isla Pence, MD  ?Turmeric (QC TUMERIC COMPLEX PO) Take 1 tablet by mouth daily.    [provider]  ?warfarin (COUMADIN) 4 MG tablet TAKE 1 TO 2 TABLETS AS DIRECTED BY COUMADIN CLINIC 09/08/21   Hilty, [name], MD  ?   ? ?Allergies    ?Patient has no known allergies.   ? ?Review of Systems   ?Review of Systems  ?Psychiatric/Behavioral:  Positive for confusion.   ? ?Physical Exam ?Updated Vital Signs ?BP (!) 159/100   Pulse 74   Temp 97.7 ?F (36.5 ?C) (Oral)   Resp (!) 21   SpO2 98%  ?Physical Exam ?Vitals and nursing note reviewed.  ?Constitutional:   ?   General: He is not in acute distress. ?   Appearance: Normal appearance. He is well-developed.  ?HENT:  ?   Head: Normocephalic and atraumatic.  ?Eyes:  ?   Conjunctiva/sclera: Conjunctivae  normal.  ?Cardiovascular:  ?   Rate and Rhythm: Normal rate. Rhythm irregular.  ?   Heart sounds: No murmur heard. ?Pulmonary:  ?   Effort: Pulmonary effort is normal. No respiratory distress.  ?   Breath sounds: Normal breath sounds.  ?Abdominal:  ?   Palpations: Abdomen is soft.  ?   Tenderness: There is no abdominal tenderness.  ?Musculoskeletal:     ?   General: No swelling.  ?   Cervical back: Neck supple.  ?Skin: ?   General: Skin is warm and dry.  ?   Capillary Refill: Capillary refill takes less than 2 seconds.  ?Neurological:  ?   General: No focal deficit present.  ?   Mental Status: He is alert. He is disoriented.  ?   Cranial Nerves: No cranial nerve deficit.  ?   Sensory: No sensory deficit.  ?   Motor: No weakness.  ?   Comments: Patient is awake and alert.  Seems  to have a left gaze preference although will cross midline.  Not really sure if he can visualize as will not give answers to counting fingers.  He does not comply with neurologic exam but is localizing all 4 extremities and withdrawing to pain.  ? ? ?ED Results / Procedures / Treatments   ?Labs ?(all labs ordered are listed, but only abnormal results are displayed) ?Labs Reviewed  ?CBC - Abnormal; Notable for the following components:  ?    Result Value  ? RDW 15.9 (*)   ? Platelets 117 (*)   ? All other components within normal limits  ?DIFFERENTIAL - Abnormal; Notable for the following components:  ? Lymphs Abs 0.5 (*)   ? All other components within normal limits  ?COMPREHENSIVE METABOLIC PANEL - Abnormal; Notable for the following components:  ? Sodium 134 (*)   ? Glucose, Bld 204 (*)   ? Calcium 8.8 (*)   ? Total Protein 8.3 (*)   ? Total Bilirubin 1.6 (*)   ? All other components within normal limits  ?URINALYSIS, ROUTINE W REFLEX MICROSCOPIC - Abnormal; Notable for the following components:  ? Glucose, UA 100 (*)   ? Hgb urine dipstick TRACE (*)   ? Protein, ur 100 (*)   ? All other components within normal limits  ?PROTIME-INR - Abnormal; Notable for the following components:  ? Prothrombin Time 22.2 (*)   ? INR 2.0 (*)   ? All other components within normal limits  ?URINALYSIS, MICROSCOPIC (REFLEX) - Abnormal; Notable for the following components:  ? Bacteria, UA RARE (*)   ? All other components within normal limits  ?PROTIME-INR - Abnormal; Notable for the following components:  ? Prothrombin Time 16.2 (*)   ? INR 1.3 (*)   ? All other components within normal limits  ?CBG MONITORING, ED - Abnormal; Notable for the following components:  ? Glucose-Capillary 189 (*)   ? All other components within normal limits  ?RESP PANEL BY RT-PCR (FLU A&B, COVID) ARPGX2  ?ETHANOL  ?RAPID URINE DRUG SCREEN, HOSP PERFORMED  ?APTT  ?PROTIME-INR  ? ? ?EKG ?EKG Interpretation ? ?Date/Time:  Saturday October 09 2021 16:30:03  EDT ?Ventricular Rate:  80 ?PR Interval:    ?QRS Duration: 98 ?QT Interval:  425 ?QTC Calculation: 491 ?R Axis:   3 ?Text Interpretation: Atrial fibrillation Borderline repolarization abnormality Borderline prolonged QT interval rate faster than prior 7/22 Confirmed by Aletta Edouard [phone]) on 10/09/2021 4:33:36 PM ? ?Radiology ?CT HEAD CODE STROKE WO CONTRAST ? ?  Result Date: 10/09/2021 ?CLINICAL DATA:  Code stroke. Neuro deficit, acute, stroke suspected. EXAM: CT HEAD WITHOUT CONTRAST TECHNIQUE: Contiguous axial images were obtained from the base of the skull through the vertex without intravenous contrast. RADIATION DOSE REDUCTION: This exam was performed according to the departmental dose-optimization program which includes automated exposure control, adjustment of the mA and/or kV according to patient size and/or use of iterative reconstruction technique. COMPARISON:  01/14/2021 FINDINGS: Brain: No abnormality affects the brainstem or cerebellum. Cerebral hemispheres show atrophy with extensive chronic small-vessel ischemic changes throughout the white matter. There is an acute intraparenchymal hematoma within the left temporal occipital junction, inferior to the atrium and occipital horn of the left lateral ventricle, measuring 1.8 x 1.2 x 1.0 cm (volume = 1.1 cm^3) mild surrounding edema. This is most consistent with a hemorrhagic infarction. Hemorrhagic metastasis is not excluded but as a solitary lesion would be less likely. No hydrocephalus or extra-axial collection. Vascular: There is atherosclerotic calcification of the major vessels at the base of the brain. Skull: Negative Sinuses/Orbits: Clear/normal Other: None IMPRESSION: 1. Background pattern of brain atrophy with extensive chronic small-vessel ischemic changes. Acute intraparenchymal hematoma at the left temporal occipital junction, just inferior to the atrium and occipital horn of the left lateral ventricle, volume 1.1 cc. This probably represents a  hemorrhagic small vessel infarction. Hemorrhagic metastasis is a consideration but is not felt to be the most likely a explanation. 2. These results were called by telephone at the time of interpretation

## 2021-10-09 NOTE — ED Notes (Signed)
Pt given ativan in ct scan , techs unable to do scan due to pt moving ?

## 2021-10-09 NOTE — ED Notes (Signed)
Seizure pads  ?

## 2021-10-09 NOTE — ED Notes (Signed)
Report called to 4N RN , [facility] called for transport  ?

## 2021-10-10 ENCOUNTER — Inpatient Hospital Stay (HOSPITAL_COMMUNITY): Payer: Medicare HMO

## 2021-10-10 DIAGNOSIS — R569 Unspecified convulsions: Secondary | ICD-10-CM | POA: Diagnosis not present

## 2021-10-10 DIAGNOSIS — I6389 Other cerebral infarction: Secondary | ICD-10-CM | POA: Diagnosis not present

## 2021-10-10 LAB — COMPREHENSIVE METABOLIC PANEL
ALT: 15 U/L (ref 0–44)
AST: 21 U/L (ref 15–41)
Albumin: 3.6 g/dL (ref 3.5–5.0)
Alkaline Phosphatase: 95 U/L (ref 38–126)
Anion gap: 9 (ref 5–15)
BUN: 9 mg/dL (ref 8–23)
CO2: 27 mmol/L (ref 22–32)
Calcium: 8.8 mg/dL — ABNORMAL LOW (ref 8.9–10.3)
Chloride: 100 mmol/L (ref 98–111)
Creatinine, Ser: 0.85 mg/dL (ref 0.61–1.24)
GFR, Estimated: >60 mL/min (ref >60)
Glucose, Bld: 84 mg/dL (ref 70–99)
Potassium: 3.4 mmol/L — ABNORMAL LOW (ref 3.5–5.1)
Sodium: 136 mmol/L (ref 135–145)
Total Bilirubin: 1.9 mg/dL — ABNORMAL HIGH (ref 0.3–1.2)
Total Protein: 7.4 g/dL (ref 6.5–8.1)

## 2021-10-10 LAB — GLUCOSE, CAPILLARY
Glucose-Capillary: 105 mg/dL — ABNORMAL HIGH (ref 70–99)
Glucose-Capillary: 129 mg/dL — ABNORMAL HIGH (ref 70–99)
Glucose-Capillary: 79 mg/dL (ref 70–99)
Glucose-Capillary: 85 mg/dL (ref 70–99)
Glucose-Capillary: 94 mg/dL (ref 70–99)

## 2021-10-10 LAB — HEMOGLOBIN A1C
Hgb A1c MFr Bld: 5 % (ref 4.8–5.6)
Mean Plasma Glucose: 96.8 mg/dL

## 2021-10-10 LAB — LIPID PANEL
Cholesterol: 125 mg/dL (ref 0–200)
HDL: 46 mg/dL (ref >40)
LDL Cholesterol: 62 mg/dL (ref 0–99)
Total CHOL/HDL Ratio: 2.7 RATIO
Triglycerides: 86 mg/dL (ref <150)
VLDL: 17 mg/dL (ref 0–40)

## 2021-10-10 LAB — PROTIME-INR
INR: 1.2 (ref 0.8–1.2)
Prothrombin Time: 14.9 seconds (ref 11.4–15.2)

## 2021-10-10 LAB — CBC
HCT: 45.3 % (ref 39.0–52.0)
Hemoglobin: 14.7 g/dL (ref 13.0–17.0)
MCH: 26.5 pg (ref 26.0–34.0)
MCHC: 32.5 g/dL (ref 30.0–36.0)
MCV: 81.8 fL (ref 80.0–100.0)
Platelets: 130 10*3/uL — ABNORMAL LOW (ref 150–400)
RBC: 5.54 MIL/uL (ref 4.22–5.81)
RDW: 15.7 % — ABNORMAL HIGH (ref 11.5–15.5)
WBC: 7.8 10*3/uL (ref 4.0–10.5)
nRBC: 0 % (ref 0.0–0.2)

## 2021-10-10 LAB — MRSA NEXT GEN BY PCR, NASAL: MRSA by PCR Next Gen: NOT DETECTED

## 2021-10-10 LAB — ECHOCARDIOGRAM COMPLETE: S' Lateral: 2.7 cm

## 2021-10-10 IMAGING — MR MR HEAD WO/W CM
7 of 13 series · 21 of 48 positions shown · IV contrast (gadavist)
Comparison: Head CTA from yesterday

CLINICAL DATA: Stroke follow-up.

EXAM:
MRI HEAD WITHOUT AND WITH CONTRAST
TECHNIQUE: Multiplanar, multiecho pulse sequences of the brain and surrounding
structures were obtained without and with intravenous contrast.
CONTRAST:  10mL GADAVIST GADOBUTROL 1 MMOL/ML IV SOLN

[Series 2: DWI · axial · 3.0mm · 0.94mm/px · z∈[-65,+85]mm · 6 of 102 slices shown (1 of 2)]
[im 1/102]
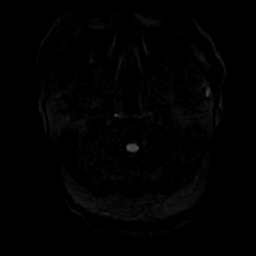
[im 21/102]
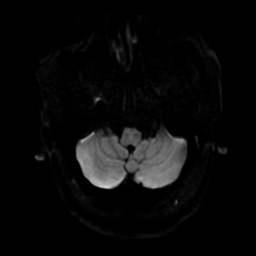
[im 41/102]
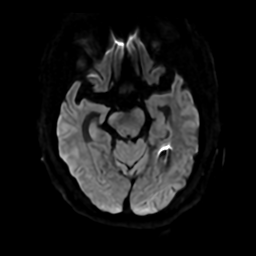
[im 61/102]
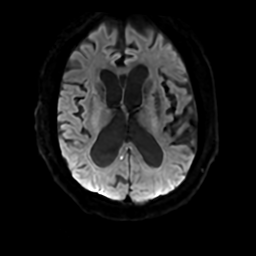
[im 81/102]
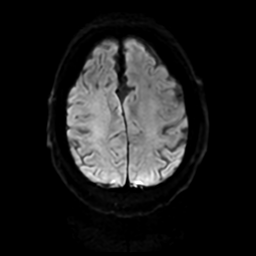
[im 102/102]
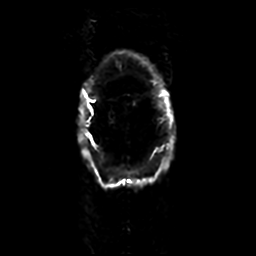

[Series 3: DWI · coronal · 4.0mm · 0.94mm/px · 5 of 76 slices shown (2 of 2)]
[im 1/76]
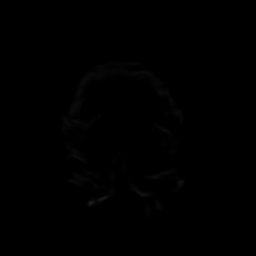
[im 19/76]
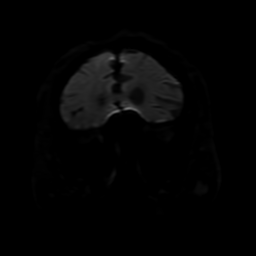
[im 38/76]
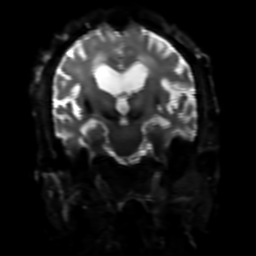
[im 57/76]
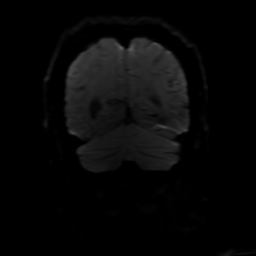
[im 76/76]
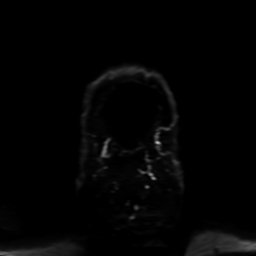

[Series 4: FLAIR · sagittal · 5.0mm · 0.23mm/px · 2 of 26 slices shown (1 of 2)]
[im 1/26]
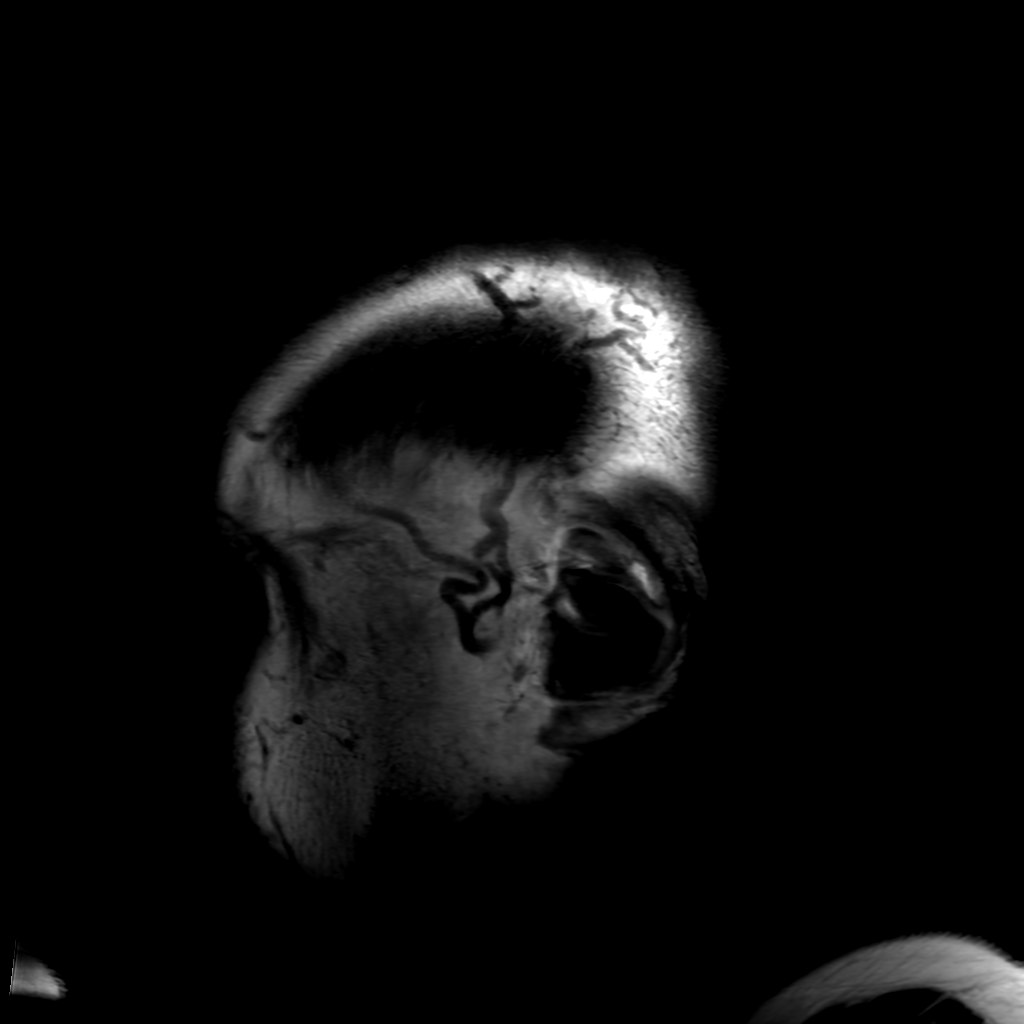
[im 26/26]
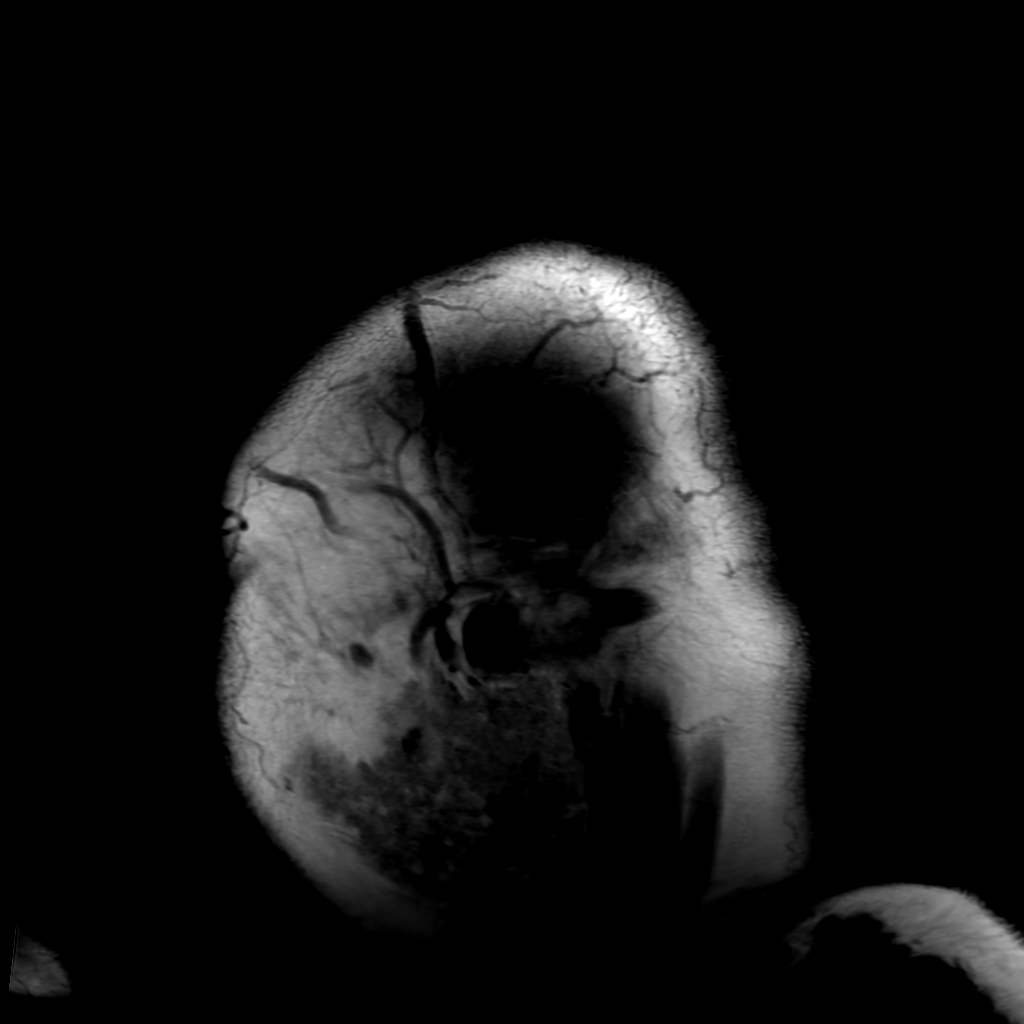

[Series 5: T2 · axial · 5.0mm · 0.23mm/px · 1 of 26 slices shown]
[im 1/26]
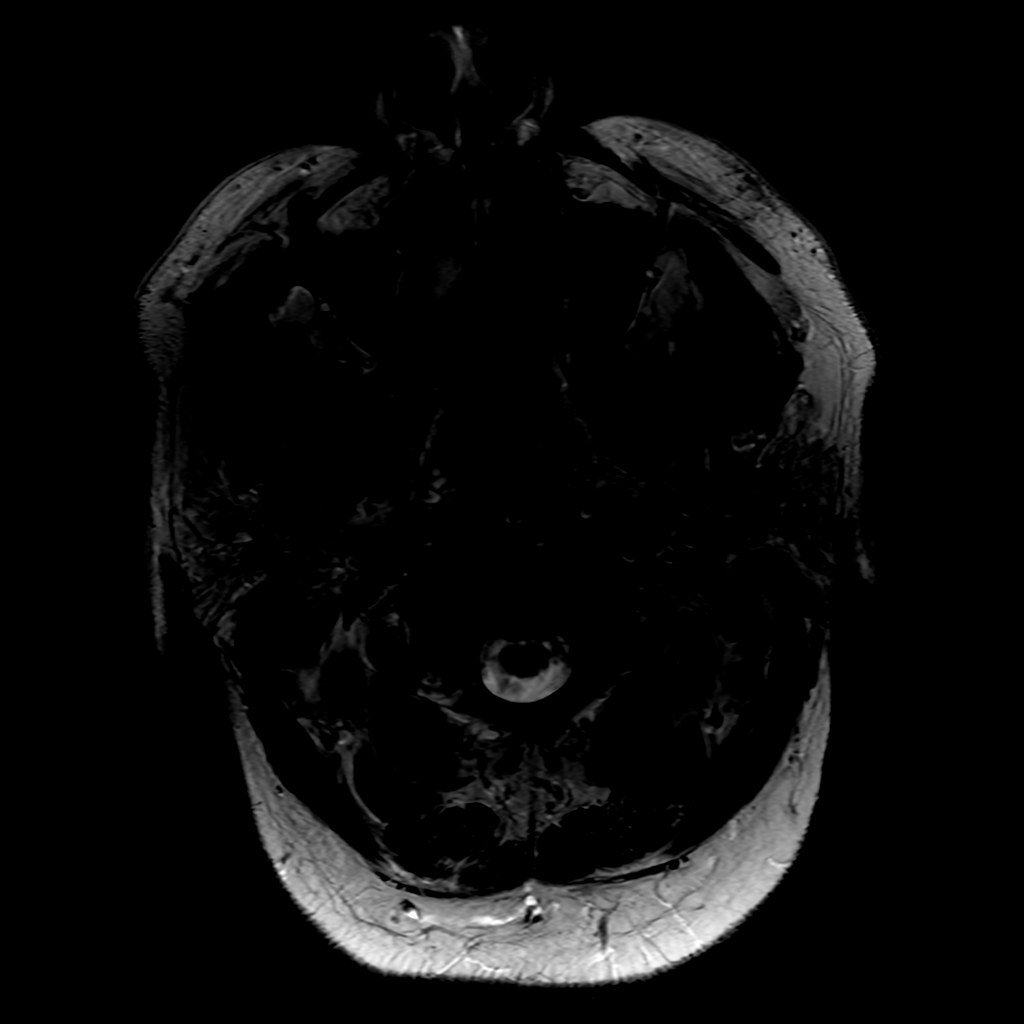

[Series 6: FLAIR · axial · 4.0mm · 0.45mm/px · z∈[-65,+84]mm · 2 of 35 slices shown (2 of 2)]
[im 1/35]
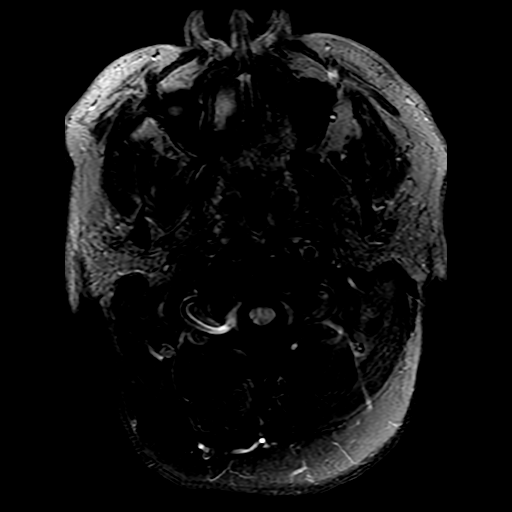
[im 35/35]
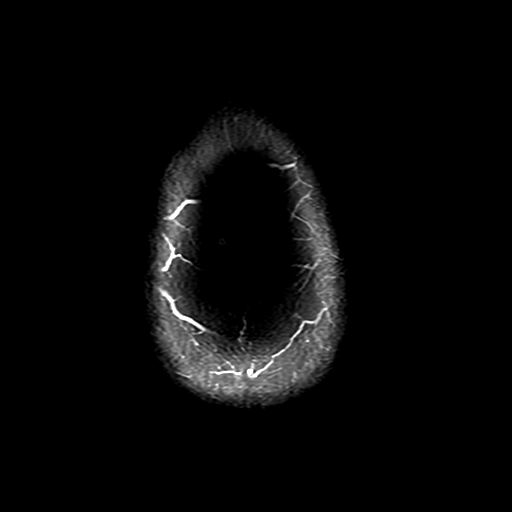

[Series 250: ADC · axial · 3.0mm · 0.94mm/px · z∈[-65,+85]mm · 3 of 51 slices shown (1 of 2)]
[im 1/51]
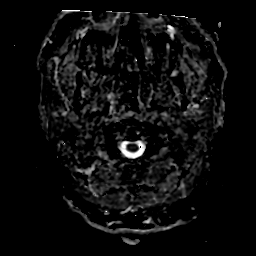
[im 26/51]
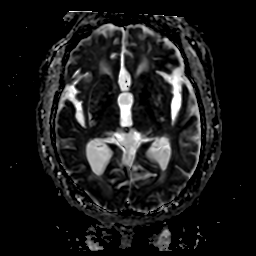
[im 51/51]
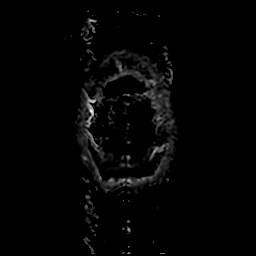

[Series 350: ADC · coronal · 4.0mm · 0.94mm/px · 2 of 37 slices shown (2 of 2)]
[im 1/37]
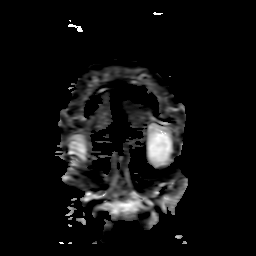
[im 37/37]
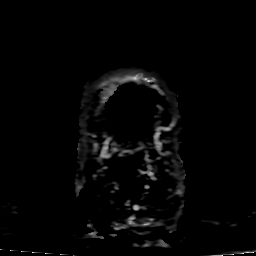

[21 of 48 positions shown; findings below may reference images not displayed]

FINDINGS: Brain: Known acute hemorrhage in the left cerebral white matter,
below the atrium of the left lateral ventricle, 19 mm anterior to
posterior and 12 mm craniocaudal. Accounting for early intrinsic T1
shortening no evidence of internal enhancement. No unusual adjacent
vessels.

Small acute or subacute infarct in the parasagittal right parietal
lobe, there is a background of advanced chronic small vessel
ischemia with confluent gliosis. Chronic small vessel infarcts in
the centrum semiovale with wallerian degeneration crossing the
corpus callosum.

No hydrocephalus or extra-axial collection

Vascular: Recent CTA.  No emergent finding.

Skull and upper cervical spine: No focal marrow lesion

Sinuses/Orbits: Negative
IMPRESSION: 1. Size stable left cerebral hematoma below the atrium of the left
lateral ventricle. No evidence of underlying mass or large vessel
infarct. Background of advanced chronic small vessel ischemia.
2. Small recent infarct in the parasagittal right parietal lobe

## 2021-10-10 MED ORDER — ATORVASTATIN CALCIUM 40 MG PO TABS: 40.0000 mg | ORAL_TABLET | Freq: Every day | ORAL | Status: DC

## 2021-10-10 MED ORDER — GADOBUTROL 1 MMOL/ML IV SOLN
10.0000 mL | Freq: Once | INTRAVENOUS | Status: AC | PRN
  Administered 2021-10-10: 10 mL via INTRAVENOUS

## 2021-10-10 MED ORDER — LEVETIRACETAM IN NACL 500 MG/100ML IV SOLN
500.0000 mg | Freq: Two times a day (BID) | INTRAVENOUS | Status: DC
  Administered 2021-10-10 – 2021-10-11 (×3): 500 mg via INTRAVENOUS
  Filled 2021-10-10 (×3): qty 100

## 2021-10-10 MED ORDER — DILTIAZEM HCL 60 MG PO TABS
30.0000 mg | ORAL_TABLET | Freq: Two times a day (BID) | ORAL | Status: DC
  Administered 2021-10-11 – 2021-10-12 (×3): 30 mg via ORAL
  Filled 2021-10-10 (×3): qty 1

## 2021-10-10 MED ORDER — POTASSIUM CHLORIDE CRYS ER 20 MEQ PO TBCR
40.0000 meq | EXTENDED_RELEASE_TABLET | Freq: Once | ORAL | Status: AC
  Administered 2021-10-10: 40 meq via ORAL
  Filled 2021-10-10: qty 2

## 2021-10-10 MED ORDER — PANTOPRAZOLE SODIUM 40 MG PO TBEC
40.0000 mg | DELAYED_RELEASE_TABLET | Freq: Every day | ORAL | Status: DC
  Administered 2021-10-10 – 2021-10-17 (×8): 40 mg via ORAL
  Filled 2021-10-10 (×8): qty 1

## 2021-10-10 MED ORDER — HYDRALAZINE HCL 20 MG/ML IJ SOLN
10.0000 mg | Freq: Four times a day (QID) | INTRAMUSCULAR | Status: DC | PRN
Start: 2021-10-10 — End: 2021-10-11
  Administered 2021-10-10: 10 mg via INTRAVENOUS
  Filled 2021-10-10: qty 1

## 2021-10-10 MED ORDER — HYDRALAZINE HCL 20 MG/ML IJ SOLN
INTRAMUSCULAR | Status: AC
  Filled 2021-10-10: qty 1

## 2021-10-10 MED ORDER — DILTIAZEM HCL ER COATED BEADS 120 MG PO CP24
120.0000 mg | ORAL_CAPSULE | Freq: Every day | ORAL | Status: DC
  Administered 2021-10-10: 120 mg via ORAL
  Filled 2021-10-10: qty 1

## 2021-10-10 NOTE — Progress Notes (Signed)
Echocardiogram ?2D Echocardiogram has been performed. ? ?Oneal Deputy Sylvi Rybolt RDCS ?10/10/2021, 2:09 PM ?

## 2021-10-10 NOTE — Progress Notes (Signed)
PT Cancellation Note ? ?Patient Details ?Name: Steven Barnett ?MRN: [id number] ?DOB: April 10, 1951 ? ? ?Cancelled Treatment:    Reason Eval/Treat Not Completed: Active bedrest order. PT will follow up when the pt is off bedrest and able to participate in functional mobility assessment. ? ? ?Zenaida Niece ?10/10/2021, 7:43 AM ?

## 2021-10-10 NOTE — Procedures (Signed)
Patient Name: Steven Barnett  ?MRN: [id number]  ?Epilepsy Attending: Lora Havens  ?Referring Physician/Provider: Amie Portland, MD ?Date: 10/10/2021 ?Duration: 21.31 mins ? ?Patient history: 71 year old with history of persistent atrial fibrillation on Coumadin, colon cancer amongst other comorbidities brought in for evaluation of altered mental status and noticed to have seizure activity by family.  CT head in the emergency room with acute intraparenchymal hemorrhage at the left temporal occipital junction just inferior to the atrium and occipital horn of the left lateral ventricle of 1 cc volume without frank IVH. EEG to evaluate for seizure ? ?Level of alertness: Awake, asleep ? ?AEDs during EEG study: LEV ? ?Technical aspects: This EEG study was done with scalp electrodes positioned according to the 10-20 International system of electrode placement. Electrical activity was acquired at a sampling rate of '500Hz'$  and reviewed with a high frequency filter of '70Hz'$  and a low frequency filter of '1Hz'$ . EEG data were recorded continuously and digitally stored.  ? ?Description: The posterior dominant rhythm consists of 8 Hz activity of moderate voltage (25-35 uV) seen predominantly in posterior head regions, symmetric and reactive to eye opening and eye closing. Sleep was characterized by vertex waves, sleep spindles (12 to 14 Hz), maximal frontocentral region.  Hyperventilation and photic stimulation were not performed.    ? ?IMPRESSION: ?This study is within normal limits. No seizures or epileptiform discharges were seen throughout the recording. ? ?Lora Havens  ? ?

## 2021-10-10 NOTE — Evaluation (Addendum)
Speech Language Pathology Evaluation ?Patient Details ?Name: Steven Barnett ?MRN: [id number] ?DOB: 1951-06-22 ?Today's Date: 10/10/2021 ?Time: 1535-1550 ?SLP Time Calculation (min) (ACUTE ONLY): 15 min ? ?Problem List:  ?Patient Active Problem List  ? Diagnosis Date Noted  ? ICH (intracerebral hemorrhage) ([place]) 10/09/2021  ? Hypovolemia 01/14/2021  ? Hypokalemia 01/14/2021  ? Hypomagnesemia 01/14/2021  ? Normocytic anemia 01/14/2021  ? Gait instability 01/14/2021  ? Encounter for therapeutic drug monitoring 10/12/2018  ? Hypotension 08/17/2016  ? SIRS (systemic inflammatory response syndrome) ([place]) 08/17/2016  ? Influenza A 08/17/2016  ? Septic shock ([place]) 08/10/2016  ? Neutropenia, drug-induced ([place])   ? Gastroenteritis due to norovirus   ? Acute kidney injury ([place])   ? Genetic testing 07/01/2016  ? Port catheter in place 06/29/2016  ? Family history of colon cancer 06/08/2016  ? Family history of colonic polyps 06/08/2016  ? Cancer of right colon ([place]) 04/12/2016  ? Pre-operative cardiovascular examination 04/06/2016  ? Acute on chronic diastolic heart failure ([place]) 02/02/2016  ? Acute respiratory failure with hypoxia ([place]) 02/02/2016  ? Anemia, iron deficiency 02/02/2016  ? Current use of long term anticoagulation 12/03/2015  ? Pneumonia 09/30/2015  ? PNA (pneumonia) 09/30/2015  ? Acute on chronic congestive heart failure ([place])   ? CHF exacerbation ([place]) 07/08/2015  ? Community acquired pneumonia 02/10/2015  ? ED (erectile dysfunction) 08/12/2014  ? Arthralgia of both knees 04/08/2014  ? Chronic diastolic (congestive) heart failure ([place]) 01/14/2009  ? Morbid obesity ([place]) 12/31/2008  ? Obstructive sleep apnea 12/31/2008  ? Essential hypertension, benign 12/31/2008  ? Atrial fibrillation ([place]) 12/31/2008  ? ?Past Medical History:  ?Past Medical History:  ?Diagnosis Date  ? Anemia   ? Arthralgia of both knees   ? Arthritis   ? "right knee" (10/01/2015)  ? Atrial fibrillation ([place])   ? Cancer [facility])   ? STAGE 1 COLON  CANCER  ? Chronic anticoagulation 2010  ? Coumadin  ? Chronic diastolic CHF (congestive heart failure), NYHA class 2 ([place])   ? Dyspnea   ? Dysrhythmia   ? Hypertension   ? OSA (obstructive sleep apnea) 2016  ? "couldn't take the mask during the testing" (10/01/2015)  ? Permanent atrial fibrillation ([place]) 2010  ? Pneumonia 3/29/2017and june 2017  ? ?Past Surgical History:  ?Past Surgical History:  ?Procedure Laterality Date  ? COLONOSCOPY WITH PROPOFOL N/A 03/04/2016  ? Procedure: COLONOSCOPY WITH PROPOFOL;  Surgeon: Carol Ada, MD;  Location: [facility];  Service: Endoscopy;  Laterality: N/A;  ? COLONOSCOPY WITH PROPOFOL N/A 09/15/2017  ? Procedure: COLONOSCOPY WITH PROPOFOL;  Surgeon: Carol Ada, MD;  Location: [facility];  Service: Endoscopy;  Laterality: N/A;  ? COLONOSCOPY WITH PROPOFOL N/A 02/12/2021  ? Procedure: COLONOSCOPY WITH PROPOFOL;  Surgeon: Carol Ada, MD;  Location: [facility];  Service: Endoscopy;  Laterality: N/A;  ? LAPAROSCOPIC PARTIAL COLECTOMY N/A 04/12/2016  ? Procedure: LAPAROSCOPIC ILEOCOLECTOMY;  Surgeon: [name], MD;  Location: [place];  Service: General;  Laterality: N/A;  ? PORT-A-CATH REMOVAL N/A 02/16/2017  ? Procedure: REMOVAL PORT-A-CATH;  Surgeon: [name], MD;  Location: [place];  Service: General;  Laterality: N/A;  ? PORTACATH PLACEMENT N/A 05/18/2016  ? Procedure: INSERTION PORT-A-CATH;  Surgeon: [name], MD;  Location: [place];  Service: General;  Laterality: N/A;  ? THORACOTOMY Right 2010  ? ?HPI:  ?71 y.o. male presents to [facility] hospital as a transfer from [place] on 10/09/2021 with AMS, incontinence and weakness. Pt found to have L temporal  occipital junction IPH. Pt experienced a seizure when at [place]. PMH includes Afib on coumadin, colon cancer, Diastolic CHF, HTN, OSA.  ? ?Assessment / Plan / Recommendation ?Clinical Impression ? Patient presents with moderately impaired cognitive-linguistic function as per this evaluation. He  appeared somewhat restless and impulsive with reponses. He was oriented to month (correcting himself from 'August'), but slightly off with year (said '2022') and day of week ('Monday'). He did demonstrate some awareness to reason he was here, "had a damn stroke" but he did not demonstrate any awareness to deficits. When presented with delayed recall questions or slightly more challenging problem solving questions, he would not adequately attempt. Affect overall was flat. During testing, patient suddently fell asleep and then a couple minutes later when SLP was at computer in room, he suddently woke up asking for a blanket then promptly falling back asleep. SLP is recommending continued services while here in acute care and [facility] SLP upon discharge if cognitive impairment still present. ?   ?SLP Assessment ? SLP Recommendation/Assessment: Patient needs continued [place] [facility] ?SLP Visit Diagnosis: Cognitive communication deficit (R41.841)  ?  ?Recommendations for follow up therapy are one component of a multi-disciplinary discharge planning process, led by the attending physician.  Recommendations may be updated based on patient status, additional functional criteria and insurance authorization. ?   ?Follow Up Recommendations ? Home health SLP  ?  ?Assistance Recommended at Discharge ? Frequent or constant Supervision/Assistance  ?Functional Status Assessment Patient has had a recent decline in their functional status and demonstrates the ability to make significant improvements in function in a reasonable and predictable amount of time.  ?Frequency and Duration min 1 x/week  ?1 week ?  ?   ?SLP Evaluation ?Cognition ? Overall Cognitive Status: Impaired/Different from baseline ?Arousal/Alertness: Awake/alert ?Orientation Level: Oriented to person;Oriented to place;Disoriented to time;Disoriented to situation ?Year: 2022 ?Month: April ?Day of Week: Incorrect ?Attention: Sustained ?Sustained  Attention: Impaired ?Sustained Attention Impairment: Verbal basic;Verbal complex;Functional basic ?Memory: Impaired ?Memory Impairment: Decreased recall of new information;Storage deficit ?Awareness: Impaired ?Awareness Impairment: Intellectual impairment ?Problem Solving: Impaired ?Problem Solving Impairment: Verbal basic;Verbal complex ?Behaviors: Restless;Impulsive ?Safety/Judgment: Impaired  ?  ?   ?Comprehension ? Auditory Comprehension ?Overall Auditory Comprehension: Other (comment) (unable to fully assess, appears [facility] for basic level)  ?  ?Expression Expression ?Primary Mode of Expression: Verbal ?Verbal Expression ?Overall Verbal Expression: Impaired ?Initiation: No impairment ?Level of Generative/Spontaneous Verbalization: Phrase ?Pragmatics: Impairment ?Impairments: Abnormal affect;Eye contact ?Interfering Components: Attention ?Effective Techniques: Open ended questions;Semantic cues ?Non-Verbal Means of Communication: Not applicable ?Written Expression ?Dominant Hand: Right ?Written Expression: Not tested   ?Oral / Motor ? Oral Motor/Sensory Function ?Overall Oral Motor/Sensory Function: Mild impairment ?Facial ROM: Within Functional Limits ?Facial Symmetry: Abnormal symmetry left ?Lingual ROM: Within Functional Limits ?Lingual Symmetry: Within Functional Limits ?Motor Speech ?Overall Motor Speech: Appears within functional limits for tasks assessed ?Respiration: Within functional limits ?Resonance: Within functional limits ?Articulation: Within functional limitis ?Intelligibility: Intelligible ?Motor Planning: Witnin functional limits ?Motor Speech Errors: Not applicable   ?        ? ?Sonia Baller, MA, CCC-SLP ?Speech Therapy ? ? ?

## 2021-10-10 NOTE — Evaluation (Signed)
Physical Therapy Evaluation ?Patient Details ?Name: Steven Barnett ?MRN: [id number] ?DOB: [birth year] ?Today's Date: 10/10/2021 ? ?History of Present Illness ? 71 y.o. male presents to [facility] hospital as a transfer from [place] on 10/09/2021 with AMS, incontinence and weakness. 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 Pt experienced a seizure when at [place]. PMH includes Afib on coumadin, colon cancer, Diastolic CHF, HTN, OSA.  ?Clinical Impression ? Pt presents to PT with deficits in balance, functional mobility, gait, cogition. Pt with increased sway with mobility, requiring UE support and PT assist to maintain balance. Pt also with slowed processing during session, seems to downplay his current mobility deficits. Pt will benefit from aggressive mobilization in an effort to improve stability and reduce falls risk. PT recommends HHPT at this time, pt's daughter works from home and can assist 24/7. Pt will benefit from gait training with DME next session to improve stability.   ?   ? ?Recommendations for follow up therapy are one component of a multi-disciplinary discharge planning process, led by the attending physician.  Recommendations may be updated based on patient status, additional functional criteria and insurance authorization. ? ?Follow Up Recommendations Home health PT (may progress to outpatient) ? ?  ?Assistance Recommended at Discharge Intermittent Supervision/Assistance  ?Patient can return home with the following ? A lot of help with walking and/or transfers;A lot of help with bathing/dressing/bathroom;Assistance with cooking/housework;Direct supervision/assist for medications management;Direct supervision/assist for financial management;Assist for transportation;Help with stairs or ramp for entrance ? ?  ?Equipment Recommendations None recommended by PT (owns necessary DME)  ?Recommendations for Other Services ?    ?  ?Functional Status Assessment Patient has had a recent decline in their  functional status and demonstrates the ability to make significant improvements in function in a reasonable and predictable amount of time.  ? ?  ?Precautions / Restrictions Precautions ?Precautions: Fall ?Restrictions ?Weight Bearing Restrictions: No  ? ?  ? ?Mobility ? Bed Mobility ?Overal bed mobility: Needs Assistance ?Bed Mobility: Supine to Sit ?  ?  ?Supine to sit: Min assist, HOB elevated ?  ?  ?General bed mobility comments: hand hold to pull trunk into sitting ?  ? ?Transfers ?Overall transfer level: Needs assistance ?Equipment used: 1 person hand held assist ?Transfers: Sit to/from Stand ?Sit to Stand: Min guard ?  ?  ?  ?  ?  ?  ?  ? ?Ambulation/Gait ?Ambulation/Gait assistance: Min assist, +2 physical assistance ?Gait Distance (Feet): 5 Feet ?Assistive device: 1 person hand held assist ?Gait Pattern/deviations: Step-to pattern, Wide base of support ?Gait velocity: reduced ?Gait velocity interpretation: <1.31 ft/sec, indicative of household ambulator ?  ?General Gait Details: pt with slowed step-to gait, widened BOS with increased lateral swy to both sides. ? ?Stairs ?  ?  ?  ?  ?  ? ?Wheelchair Mobility ?  ? ?Modified Rankin (Stroke Patients Only) ?  ? ?  ? ?Balance Overall balance assessment: Needs assistance ?Sitting-balance support: No upper extremity supported, Feet supported ?Sitting balance-Leahy Scale: Fair ?  ?  ?Standing balance support: Single extremity supported ?Standing balance-Leahy Scale: Poor ?Standing balance comment: minA ?  ?  ?  ?  ?  ?  ?  ?  ?  ?  ?  ?   ? ? ? ?Pertinent Vitals/Pain Pain Assessment ?Pain Assessment: No/denies pain  ? ? ?Home Living Family/patient expects to be discharged to:: Private residence ?Living Arrangements: Children ?Available Help at Discharge: Family ?Type of Home: House ?Home  Access: Stairs to enter ?Entrance Stairs-Rails: None ?Entrance Stairs-Number of Steps: 2 ?Alternate Level Stairs-Number of Steps: flight ?Home Layout: Two level ?Home Equipment:  Cane - single point;Rolling Walker (2 wheels);BSC/3in1;Shower seat ?   ?  ?Prior Function Prior Level of Function : Independent/Modified Independent ?  ?  ?  ?  ?  ?  ?Mobility Comments: no AD ?ADLs Comments: indep, retired, drives sometimes, daughter does IADLs ?  ? ? ?Hand Dominance  ? Dominant Hand: Right ? ?  ?Extremity/Trunk Assessment  ? Upper Extremity Assessment ?Upper Extremity Assessment: Defer to OT evaluation ?  ? ?Lower Extremity Assessment ?Lower Extremity Assessment: Generalized weakness ?  ? ?Cervical / Trunk Assessment ?Cervical / Trunk Assessment: Other exceptions ?Cervical / Trunk Exceptions: excess body habitus  ?Communication  ? Communication: No difficulties  ?Cognition Arousal/Alertness: Awake/alert ?Behavior During Therapy: [facility] for tasks assessed/performed ?Overall Cognitive Status: Impaired/Different from baseline ?Area of Impairment: Problem solving ?  ?  ?  ?  ?  ?  ?  ?  ?  ?  ?  ?  ?  ?  ?Problem Solving: Slow processing ?  ?  ?  ? ?  ?General Comments General comments (skin integrity, edema, etc.): VSS on RA ? ?  ?Exercises    ? ?Assessment/Plan  ?  ?PT Assessment Patient needs continued PT services  ?PT Problem List Decreased strength;Decreased activity tolerance;Decreased balance;Decreased mobility;Decreased cognition;Decreased knowledge of use of DME;Decreased knowledge of precautions;Decreased safety awareness ? ?   ?  ?PT Treatment Interventions DME instruction;Gait training;Stair training;Functional mobility training;Therapeutic activities;Therapeutic exercise;Balance training;Neuromuscular re-education;Cognitive remediation;Patient/family education   ? ?PT Goals (Current goals can be found in the Care Plan section)  ?Acute Rehab PT Goals ?Patient Stated Goal: to return to independence ?PT Goal Formulation: With patient ?Time For Goal Achievement: 10/24/21 ?Potential to Achieve Goals: Good ? ?  ?Frequency Min 4X/week ?  ? ? ?Co-evaluation PT/OT/SLP Co-Evaluation/Treatment:  Yes ?Reason for Co-Treatment: For patient/therapist safety;To address functional/ADL transfers ?PT goals addressed during session: Mobility/safety with mobility;Balance;Strengthening/ROM ?  ?  ? ? ?  ?AM-PAC PT "6 Clicks" Mobility  ?Outcome Measure Help needed turning from your back to your side while in a flat bed without using bedrails?: A Little ?Help needed moving from lying on your back to sitting on the side of a flat bed without using bedrails?: A Little ?Help needed moving to and from a bed to a chair (including a wheelchair)?: A Lot ?Help needed standing up from a chair using your arms (e.g., wheelchair or bedside chair)?: A Little ?Help needed to walk in hospital room?: A Lot ?Help needed climbing 3-5 steps with a railing? : Total ?6 Click Score: 14 ? ?  ?End of Session   ?Activity Tolerance: Patient tolerated treatment well ?Patient left: in chair;with call bell/phone within reach;with family/visitor present;with chair alarm set ?Nurse Communication: Mobility status ?PT Visit Diagnosis: Other abnormalities of gait and mobility (R26.89);Muscle weakness (generalized) (M62.81);Other symptoms and signs involving the nervous system (R29.898) ?  ? ?Time: 6767-2094 ?PT Time Calculation (min) (ACUTE ONLY): 27 min ? ? ?Charges:   PT Evaluation ?$PT Eval Low Complexity: 1 Low ?  ?  ?   ? ? ?Zenaida Niece, PT, DPT ?Acute Rehabilitation ?Pager: [phone] ?Office [phone] ? ? ?Zenaida Niece ?10/10/2021, 1:49 PM ? ?

## 2021-10-10 NOTE — Progress Notes (Addendum)
STROKE TEAM PROGRESS NOTE  ? ?INTERVAL HISTORY ?Steven Barnett is a 71 y.o. male PMH of Afib on coumadin, colon cancer, Diastolic CHF, HTN, OSA, came in to [facility] for evaluation of AMS. CT head with acute small intraparenchymal hematoma within the left temporal occipital junction just inferior to the atrium and occipital horn of the left lateral ventricle. CTA head and neck negative for underlying vascular malformation. Coumadin reversed. Had a witnessed GTC event with R gaze deviation for which he was Keppra loaded and remains on 500 mg BID.   ? ?Patient is seen this morning in his room with no family at bedside. He is alert and oriented to person, place, and time but not events. He is unable to provide a good history. He reports feeling "normal" now.  ? ?Vitals:  ? 10/10/21 0615 10/10/21 0630 10/10/21 0700 10/10/21 0800  ?BP: 127/77 (!) 142/82 139/84 137/89  ?Pulse: 92 90 83 84  ?Resp: (!) 21 (!) 23 20 (!) 22  ?Temp:      ?TempSrc:      ?SpO2: 93% 94% 93% 96%  ? ?CBC:  ?Recent Labs  ?Lab 10/09/21 ?1646 10/10/21 ?0434  ?WBC 6.4 7.8  ?NEUTROABS 5.4  --   ?HGB 15.4 14.7  ?HCT 47.4 45.3  ?MCV 83.3 81.8  ?PLT 117* 130*  ? ?Basic Metabolic Panel:  ?Recent Labs  ?Lab 10/09/21 ?1646 10/10/21 ?0434  ?NA 134* 136  ?K 3.7 3.4*  ?CL 98 100  ?CO2 30 27  ?GLUCOSE 204* 84  ?BUN 13 9  ?CREATININE 1.04 0.85  ?CALCIUM 8.8* 8.8*  ? ?Lipid Panel:  ?Recent Labs  ?Lab 10/10/21 ?5465  ?CHOL 125  ?TRIG 86  ?HDL 46  ?CHOLHDL 2.7  ?VLDL 17  ?[place] 62  ? ?HgbA1c:  ?Recent Labs  ?Lab 10/10/21 ?0354  ?HGBA1C 5.0  ? ?Urine Drug Screen:  ?Recent Labs  ?Lab 10/09/21 ?1727  ?LABOPIA NONE DETECTED  ?COCAINSCRNUR NONE DETECTED  ?LABBENZ NONE DETECTED  ?AMPHETMU NONE DETECTED  ?THCU NONE DETECTED  ?LABBARB NONE DETECTED  ?  ?Alcohol Level  ?Recent Labs  ?Lab 10/09/21 ?1646  ?ETH <10  ? ? ?IMAGING past 24 hours ?CT HEAD CODE STROKE WO CONTRAST ? ?Result Date: 10/09/2021 ?CLINICAL DATA:  Code stroke. Neuro deficit, acute, stroke suspected. EXAM: CT HEAD  WITHOUT CONTRAST TECHNIQUE: Contiguous axial images were obtained from the base of the skull through the vertex without intravenous contrast. RADIATION DOSE REDUCTION: This exam was performed according to the departmental dose-optimization program which includes automated exposure control, adjustment of the mA and/or kV according to patient size and/or use of iterative reconstruction technique. COMPARISON:  01/14/2021 FINDINGS: Brain: No abnormality affects the brainstem or cerebellum. Cerebral hemispheres show atrophy with extensive chronic small-vessel ischemic changes throughout the white matter. There is an acute intraparenchymal hematoma within the left temporal occipital junction, inferior to the atrium and occipital horn of the left lateral ventricle, measuring 1.8 x 1.2 x 1.0 cm (volume = 1.1 cm^3) mild surrounding edema. This is most consistent with a hemorrhagic infarction. Hemorrhagic metastasis is not excluded but as a solitary lesion would be less likely. No hydrocephalus or extra-axial collection. Vascular: There is atherosclerotic calcification of the major vessels at the base of the brain. Skull: Negative Sinuses/Orbits: Clear/normal Other: None IMPRESSION: 1. Background pattern of brain atrophy with extensive chronic small-vessel ischemic changes. Acute intraparenchymal hematoma at the left temporal occipital junction, just inferior to the atrium and occipital horn of the left lateral ventricle, volume 1.1 cc.  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 Hemorrhagic metastasis is a consideration but is not felt to be the most likely a explanation. 2. These results were called by telephone at the time of interpretation on 10/09/2021 at 5:10 pm to provider [facility] , who verbally acknowledged these results. Electronically Signed   By: Nelson Chimes M.D.   On: 10/09/2021 17:12  ? ?CT ANGIO HEAD CODE STROKE ? ?Result Date: 10/09/2021 ?CLINICAL DATA:  Neuro deficit, acute, stroke  suspected. Acute intraparenchymal hemorrhage at the left temporal occipital junction. EXAM: CT ANGIOGRAPHY HEAD AND NECK TECHNIQUE: Multidetector CT imaging of the head and neck was performed using the standard protocol during bolus administration of intravenous contrast. Multiplanar CT image reconstructions and MIPs were obtained to evaluate the vascular anatomy. Carotid stenosis measurements (when applicable) are obtained utilizing NASCET criteria, using the distal internal carotid diameter as the denominator. RADIATION DOSE REDUCTION: This exam was performed according to the departmental dose-optimization program which includes automated exposure control, adjustment of the mA and/or kV according to patient size and/or use of iterative reconstruction technique. CONTRAST:  125m OMNIPAQUE IOHEXOL 350 MG/ML SOLN COMPARISON:  None. FINDINGS: CTA NECK FINDINGS Aortic arch: Normal Right carotid system: Common carotid artery widely patent to the bifurcation. Carotid bifurcation is normal without soft or calcified plaque. Cervical ICA is normal. Left carotid system: Similarly normal. Vertebral arteries: Vertebral artery origins are normal. Vessels are normal through the cervical region to the foramen magnum. Skeleton: Ordinary cervical spondylosis. Other neck: No mass or lymphadenopathy. Upper chest: Dilated esophagus.  Upper lungs are clear. Review of the MIP images confirms the above findings CTA HEAD FINDINGS Anterior circulation: Both internal carotid arteries are patent through the skull base and siphon regions. Minimal siphon atherosclerotic calcification but no stenosis. The anterior and middle cerebral vessels are normal. No large vessel occlusion or proximal stenosis. Posterior circulation: Both vertebral arteries are patent through the foramen magnum to the basilar. No basilar stenosis. Posterior circulation branch vessels are patent. No evidence of abnormal blood vessel in the region of the left-sided  hemorrhage. Venous sinuses: Patent and normal. Anatomic variants: None significant. Review of the MIP images confirms the above findings IMPRESSION: Negative CT angiography. No evidence of large vessel occlusion. No evidence of stenotic disease either in the neck or intracranial circulation. No evidence of abnormal blood vessel in the region of the left temporal occipital junction intraparenchymal hemorrhage. Electronically Signed   By: MNelson ChimesM.D.   On: 10/09/2021 18:42  ? ?CT ANGIO NECK CODE STROKE ? ?Result Date: 10/09/2021 ?CLINICAL DATA:  Neuro deficit, acute, stroke suspected. Acute intraparenchymal hemorrhage at the left temporal occipital junction. EXAM: CT ANGIOGRAPHY HEAD AND NECK TECHNIQUE: Multidetector CT imaging of the head and neck was performed using the standard protocol during bolus administration of intravenous contrast. Multiplanar CT image reconstructions and MIPs were obtained to evaluate the vascular anatomy. Carotid stenosis measurements (when applicable) are obtained utilizing NASCET criteria, using the distal internal carotid diameter as the denominator. RADIATION DOSE REDUCTION: This exam was performed according to the departmental dose-optimization program which includes automated exposure control, adjustment of the mA and/or kV according to patient size and/or use of iterative reconstruction technique. CONTRAST:  1082mOMNIPAQUE IOHEXOL 350 MG/ML SOLN COMPARISON:  None. FINDINGS: CTA NECK FINDINGS Aortic arch: Normal Right carotid system: Common carotid artery widely patent to the bifurcation. Carotid bifurcation is normal without soft or calcified plaque. Cervical ICA is normal. Left carotid system: Similarly normal. Vertebral arteries: Vertebral artery  origins are normal. Vessels are normal through the cervical region to the foramen magnum. Skeleton: Ordinary cervical spondylosis. Other neck: No mass or lymphadenopathy. Upper chest: Dilated esophagus.  Upper lungs are clear. Review  of the MIP images confirms the above findings CTA HEAD FINDINGS Anterior circulation: Both internal carotid arteries are patent through the skull base and siphon regions. Minimal siphon atherosclerotic calcifica

## 2021-10-10 NOTE — Progress Notes (Signed)
EEG complete - results pending 

## 2021-10-10 NOTE — Progress Notes (Signed)
EEG in progress. MRI planned for 10 AM. ?

## 2021-10-10 NOTE — Evaluation (Signed)
Occupational Therapy Evaluation ?Patient Details ?Name: Steven Barnett ?MRN: [id number] ?DOB: 1950-08-04 ?Today's Date: 10/10/2021 ? ? ?History of Present Illness 71 y.o. male presents to [facility] hospital as a transfer from [place] on 10/09/2021 with AMS, incontinence and weakness. 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 Pt experienced a seizure when at [place]. PMH includes Afib on coumadin, colon cancer, Diastolic CHF, HTN, OSA.  ? ?Clinical Impression ?  ?Windell was evaluated s/p the above admission list, he reports being generally indep PTA. He lives with his daughter who works from home and has a flight of steps to manage to get to his main bedroom and bathroom. Upon evaluation pt required min A for bed mobility, close min G fro sit<>stand and min A +2 for short ambulation from bed>chair without AD. Pt is generally unsteady with poor insight to deficits and safety and limited activity tolerance . Due to impairments he currently requires up to mod A for ADLs. He would benefit from RW for increased safety and continued OT acutely. Recommend d/c to home with family and HHOT fr continued therapies.  ?   ? ?Recommendations for follow up therapy are one component of a multi-disciplinary discharge planning process, led by the attending physician.  Recommendations may be updated based on patient status, additional functional criteria and insurance authorization.  ? ?Follow Up Recommendations ? Home health OT  ?  ?Assistance Recommended at Discharge Frequent or constant Supervision/Assistance  ?Patient can return home with the following A little help with walking and/or transfers;A little help with bathing/dressing/bathroom;Assistance with cooking/housework;Direct supervision/assist for medications management;Direct supervision/assist for financial management;Assist for transportation ? ?  ?Functional Status Assessment ? Patient has had a recent decline in their functional status and demonstrates the ability to  make significant improvements in function in a reasonable and predictable amount of time.  ?Equipment Recommendations ?    ?  ?Recommendations for Other Services   ? ? ?  ?Precautions / Restrictions Precautions ?Precautions: Fall ?Restrictions ?Weight Bearing Restrictions: No  ? ?  ? ?Mobility Bed Mobility ?Overal bed mobility: Needs Assistance ?Bed Mobility: Supine to Sit ?  ?  ?Supine to sit: Min assist, HOB elevated ?  ?  ?General bed mobility comments: hand hold to pull trunk into sitting ?  ? ?Transfers ?Overall transfer level: Needs assistance ?Equipment used: 1 person hand held assist ?Transfers: Sit to/from Stand ?Sit to Stand: Min guard ?  ?  ?  ?  ?  ?General transfer comment: close min G, mildly unsteady upon standing ?  ? ?  ?Balance Overall balance assessment: Needs assistance ?Sitting-balance support: No upper extremity supported, Feet supported ?Sitting balance-Leahy Scale: Fair ?  ?  ?Standing balance support: Single extremity supported ?Standing balance-Leahy Scale: Poor ?Standing balance comment: minA ?  ?  ?  ?  ?  ?  ?  ?  ?  ?  ?  ?   ? ?ADL either performed or assessed with clinical judgement  ? ?ADL Overall ADL's : Needs assistance/impaired ?Eating/Feeding: Independent;Sitting ?  ?Grooming: Set up;Sitting ?  ?Upper Body Bathing: Min guard;Sitting ?  ?Lower Body Bathing: Moderate assistance;Sit to/from stand ?  ?Upper Body Dressing : Supervision/safety;Sitting ?  ?Lower Body Dressing: Moderate assistance;Sit to/from stand ?  ?Toilet Transfer: Minimal assistance;+2 for safety/equipment;Ambulation ?  ?Toileting- Clothing Manipulation and Hygiene: Moderate assistance ?  ?  ?  ?Functional mobility during ADLs: Minimal assistance;+2 for safety/equipment ?General ADL Comments: unsteady with fatigue and poor activity tolerance. Pt  able to wash frontal peri area in standing but required assistance for rear  ? ? ? ?Vision Baseline Vision/History: 1 Wears glasses ?Ability to See in Adequate Light: 0  Adequate ?Vision Assessment?: No apparent visual deficits  ?   ?=   ?   ? ?Pertinent Vitals/Pain Pain Assessment ?Pain Assessment: No/denies pain  ? ? ? ?Hand Dominance Right ?  ?Extremity/Trunk Assessment Upper Extremity Assessment ?Upper Extremity Assessment: Overall WFL for tasks assessed;RUE deficits/detail;LUE deficits/detail ?RUE Deficits / Details: limited ROM in hands - pt states this is baseline. RUE is overall WFL ?LUE Deficits / Details: limited ROM in hands - pt states this is baseline. LUE is overal WFL ?  ?Lower Extremity Assessment ?Lower Extremity Assessment: Defer to PT evaluation ?  ?Cervical / Trunk Assessment ?Cervical / Trunk Assessment: Other exceptions ?Cervical / Trunk Exceptions: excess body habitus ?  ?Communication Communication ?Communication: No difficulties ?  ?Cognition Arousal/Alertness: Awake/alert ?Behavior During Therapy: [facility] for tasks assessed/performed ?Overall Cognitive Status: Impaired/Different from baseline ?Area of Impairment: Problem solving ?  ?  ?  ?  ?  ?  ?  ?  ?  ?  ?  ?  ?  ?  ?Problem Solving: Slow processing ?General Comments: oriented to all but date, stated "10th or 11th" - likes to joke, possibly to avoid answering when he is confused ?  ?  ?General Comments  VSS on RA, pt initially on 2L O@, but left to room air as he was >92% the entire session ? ?  ?   ?   ? ? ?Home Living Family/patient expects to be discharged to:: Private residence ?Living Arrangements: Children ?Available Help at Discharge: Family ?Type of Home: House ?Home Access: Stairs to enter ?Entrance Stairs-Number of Steps: 2 ?Entrance Stairs-Rails: None ?Home Layout: Two level ?Alternate Level Stairs-Number of Steps: flight ?Alternate Level Stairs-Rails: Right;Left ?Bathroom Shower/Tub: Tub/shower unit ?  ?Bathroom Toilet: Standard ?Bathroom Accessibility: Yes ?  ?Home Equipment: Cane - single point;Rolling Walker (2 wheels);BSC/3in1;Shower seat ?  ?  ?  ? ?  ?Prior Functioning/Environment Prior  Level of Function : Independent/Modified Independent ?  ?  ?  ?  ?  ?  ?Mobility Comments: no AD ?ADLs Comments: indep, retired, drives sometimes, daughter does IADLs ?  ? ?  ?  ?OT Problem List: Decreased range of motion;Decreased strength;Decreased activity tolerance;Impaired balance (sitting and/or standing);Decreased safety awareness;Decreased knowledge of use of DME or AE;Decreased knowledge of precautions ?  ?   ?OT Treatment/Interventions: Therapeutic exercise;Self-care/ADL training;DME and/or AE instruction;Therapeutic activities;Balance training  ?  ?OT Goals(Current goals can be found in the care plan section) Acute Rehab OT Goals ?Patient Stated Goal: home ?OT Goal Formulation: With patient ?Time For Goal Achievement: 10/24/21 ?Potential to Achieve Goals: Good ?ADL Goals ?Pt Will Perform Grooming: Independently;standing ?Pt Will Transfer to Toilet: ambulating;with modified independence ?Pt Will Perform Toileting - Clothing Manipulation and hygiene: with modified independence;sitting/lateral leans ?Additional ADL Goal #1: Pt will indep complete a 3 step ADL tasks in a nondistracting environment  ?OT Frequency: Min 2X/week ?  ? ?Co-evaluation PT/OT/SLP Co-Evaluation/Treatment: Yes ?Reason for Co-Treatment: For patient/therapist safety;To address functional/ADL transfers ?PT goals addressed during session: Mobility/safety with mobility;Balance;Strengthening/ROM ?OT goals addressed during session: ADL's and self-care ?  ? ?  ?AM-PAC OT "6 Clicks" Daily Activity     ?Outcome Measure Help from another person eating meals?: None ?Help from another person taking care of personal grooming?: A Little ?Help from another person toileting, which includes using toliet, bedpan,  or urinal?: A Lot ?Help from another person bathing (including washing, rinsing, drying)?: A Lot ?Help from another person to put on and taking off regular upper body clothing?: A Little ?Help from another person to put on and taking off regular  lower body clothing?: A Lot ?6 Click Score: 16 ?  ?End of Session Equipment Utilized During Treatment: Gait belt ?Nurse Communication: Mobility status ? ?Activity Tolerance:   ?Patient left: in chair;with ca

## 2021-10-10 NOTE — Progress Notes (Signed)
Pt HR dropping into 40s (A Fib) while resting. Increases with stimulation. Provider notified. ?

## 2021-10-10 NOTE — Progress Notes (Signed)
In MRI, continuous monitoring in place ?

## 2021-10-10 NOTE — Progress Notes (Signed)
OT Cancellation Note ? ?Patient Details ?Name: Steven Barnett ?MRN: [id number] ?DOB: [birth year] ? ? ?Cancelled Treatment:    Reason Eval/Treat Not Completed: Other (comment) (Pt just returned from MRI and now has elevated BP; OT evaluation to f/u later today or another date as appropriate.) ? ?Shaine Newmark A Tryce Surratt ?10/10/2021, 11:30 AM ?

## 2021-10-11 DIAGNOSIS — I610 Nontraumatic intracerebral hemorrhage in hemisphere, subcortical: Secondary | ICD-10-CM | POA: Diagnosis not present

## 2021-10-11 LAB — CBC
HCT: 45 % (ref 39.0–52.0)
Hemoglobin: 14.7 g/dL (ref 13.0–17.0)
MCH: 26.4 pg (ref 26.0–34.0)
MCHC: 32.7 g/dL (ref 30.0–36.0)
MCV: 80.8 fL (ref 80.0–100.0)
Platelets: 125 10*3/uL — ABNORMAL LOW (ref 150–400)
RBC: 5.57 MIL/uL (ref 4.22–5.81)
RDW: 15.8 % — ABNORMAL HIGH (ref 11.5–15.5)
WBC: 7.6 10*3/uL (ref 4.0–10.5)
nRBC: 0 % (ref 0.0–0.2)

## 2021-10-11 LAB — COMPREHENSIVE METABOLIC PANEL
ALT: 12 U/L (ref 0–44)
AST: 19 U/L (ref 15–41)
Albumin: 3.7 g/dL (ref 3.5–5.0)
Alkaline Phosphatase: 100 U/L (ref 38–126)
Anion gap: 7 (ref 5–15)
BUN: 10 mg/dL (ref 8–23)
CO2: 26 mmol/L (ref 22–32)
Calcium: 9 mg/dL (ref 8.9–10.3)
Chloride: 103 mmol/L (ref 98–111)
Creatinine, Ser: 0.92 mg/dL (ref 0.61–1.24)
GFR, Estimated: >60 mL/min (ref >60)
Glucose, Bld: 86 mg/dL (ref 70–99)
Potassium: 3.6 mmol/L (ref 3.5–5.1)
Sodium: 136 mmol/L (ref 135–145)
Total Bilirubin: 2.5 mg/dL — ABNORMAL HIGH (ref 0.3–1.2)
Total Protein: 7.2 g/dL (ref 6.5–8.1)

## 2021-10-11 LAB — GLUCOSE, CAPILLARY
Glucose-Capillary: 83 mg/dL (ref 70–99)
Glucose-Capillary: 72 mg/dL (ref 70–99)
Glucose-Capillary: 87 mg/dL (ref 70–99)
Glucose-Capillary: 104 mg/dL — ABNORMAL HIGH (ref 70–99)
Glucose-Capillary: 124 mg/dL — ABNORMAL HIGH (ref 70–99)

## 2021-10-11 LAB — PROTIME-INR
INR: 1 (ref 0.8–1.2)
Prothrombin Time: 13.4 seconds (ref 11.4–15.2)

## 2021-10-11 MED ORDER — LEVETIRACETAM IN NACL 500 MG/100ML IV SOLN
500.0000 mg | Freq: Two times a day (BID) | INTRAVENOUS | Status: AC
  Administered 2021-10-11: 500 mg via INTRAVENOUS
  Filled 2021-10-11: qty 100

## 2021-10-11 MED ORDER — LABETALOL HCL 5 MG/ML IV SOLN
20.0000 mg | Freq: Once | INTRAVENOUS | Status: AC
  Administered 2021-10-11: 20 mg via INTRAVENOUS
  Filled 2021-10-11: qty 4

## 2021-10-11 MED ORDER — AMLODIPINE BESYLATE 5 MG PO TABS
5.0000 mg | ORAL_TABLET | Freq: Every day | ORAL | Status: DC
  Administered 2021-10-11 – 2021-10-12 (×2): 5 mg via ORAL
  Filled 2021-10-11 (×2): qty 1

## 2021-10-11 MED ORDER — LEVETIRACETAM 500 MG PO TABS
500.0000 mg | ORAL_TABLET | Freq: Two times a day (BID) | ORAL | Status: DC
  Administered 2021-10-12 – 2021-10-18 (×13): 500 mg via ORAL
  Filled 2021-10-11 (×14): qty 1

## 2021-10-11 MED ORDER — ENOXAPARIN SODIUM 40 MG/0.4ML IJ SOSY
40.0000 mg | PREFILLED_SYRINGE | INTRAMUSCULAR | Status: DC
  Administered 2021-10-11 – 2021-10-18 (×8): 40 mg via SUBCUTANEOUS
  Filled 2021-10-11 (×8): qty 0.4

## 2021-10-11 MED ORDER — HYDRALAZINE HCL 20 MG/ML IJ SOLN
20.0000 mg | Freq: Four times a day (QID) | INTRAMUSCULAR | Status: DC | PRN
  Administered 2021-10-11 – 2021-10-12 (×2): 20 mg via INTRAVENOUS
  Filled 2021-10-11 (×2): qty 1

## 2021-10-11 NOTE — TOC CAGE-AID Note (Signed)
Transition of Care (TOC) - CAGE-AID Screening ? ? ?Patient Details  ?Name: Steven Barnett ?MRN: [id number] ?Date of Birth: [birth year] ? ?Transition of Care (TOC) CM/SW Contact:    ?Destini Cambre C Tarpley-Carter, LCSWA ?Phone Number: ?10/11/2021, 8:59 AM ? ? ?Clinical Narrative: ?Pt is unable to participate in Cage Aid. ? ?Passenger transport manager, MSW, LCSW-A ?Pronouns:  She/Her/Hers ?[facility] ?Clinical Social Worker ?Direct Number:  [phone] ?[email or web address] ? ?CAGE-AID Screening: ?Substance Abuse Screening unable to be completed due to: : Patient unable to participate ? ?  ?  ?  ?  ?  ? ?Substance Abuse Education Offered: No ? ?  ? ? ? ? ? ? ?

## 2021-10-11 NOTE — Progress Notes (Signed)
Occupational Therapy Treatment ?Patient Details ?Name: Steven Barnett ?MRN: [id number] ?DOB: Jul 13, 1950 ?Today's Date: 10/11/2021 ? ? ?History of present illness 71 y.o. male presents to [facility] hospital as a transfer from [place] on 10/09/2021 with AMS, incontinence and weakness. 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 Pt [place] a seizure when [place]. PMH includes Afib on coumadin, colon cancer, Diastolic CHF, HTN, OSA. ?  ?OT comments ? Aldyn required increased assist this session for functional mobility needing mod A for bed mobility and sit<>stand transfer with RW. He was limited by general fatigue, poor activity tolerance and RLE foot pain. Pt also internally distracted from being "cold" this date and needed required cues for attention, problem solving and safety. D/c remains appropriate, OT to continue to follow acutely.   ? ?Recommendations for follow up therapy are one component of a multi-disciplinary discharge planning process, led by the attending physician.  Recommendations may be updated based on patient status, additional functional criteria and insurance authorization. ?   ?Follow Up Recommendations ? Home health OT  ?  ?Assistance Recommended at Discharge Frequent or constant Supervision/Assistance  ?Patient can return home with the following ? A little help with walking and/or transfers;A little help with bathing/dressing/bathroom;Assistance with cooking/housework;Direct supervision/assist for medications management;Direct supervision/assist for financial management;Assist for transportation ?  ?   ?   ?Precautions / Restrictions Precautions ?Precautions: Fall ?Restrictions ?Weight Bearing Restrictions: No  ? ? ?  ? ?Mobility Bed Mobility ?Overal bed mobility: Needs Assistance ?Bed Mobility: Supine to Sit ?  ?  ?Supine to sit: Mod assist ?  ?  ?General bed mobility comments: assist to advance LLE and elevate trunk ?  ? ?Transfers ?Overall transfer level: Needs assistance ?Equipment  used: Rolling walker (2 wheels) ?Transfers: Sit to/from Stand, Bed to chair/wheelchair/BSC ?Sit to Stand: Mod assist ?  ?  ?Step pivot transfers: Min assist ?  ?  ?General transfer comment: mod A to power into standing and steady; min A for pivotal steps. very unsteady. unable to progerss gait further ?  ?  ?Balance Overall balance assessment: Needs assistance ?Sitting-balance support: No upper extremity supported, Feet supported ?Sitting balance-Leahy Scale: Fair ?  ?  ?Standing balance support: Bilateral upper extremity supported, During functional activity ?Standing balance-Leahy Scale: Poor ?  ?  ?  ?  ?  ?  ?  ?  ?  ?  ?  ?  ?   ? ?ADL either performed or assessed with clinical judgement  ? ?ADL Overall ADL's : Needs assistance/impaired ?Eating/Feeding: Independent;Sitting ?  ?  ?  ?  ?  ?  ?  ?  ?  ?  ?  ?Toilet Transfer: Moderate assistance;Stand-pivot;BSC/3in1;Rolling walker (2 wheels) ?Toilet Transfer Details (indicate cue type and reason): increased difficulty this date ?  ?  ?  ?  ?Functional mobility during ADLs: Moderate assistance;Rolling walker (2 wheels);Cueing for sequencing ?General ADL Comments: needed more assist overall this date with very poor activity tolerance ?  ? ?Extremity/Trunk Assessment Upper Extremity Assessment ?Upper Extremity Assessment: Overall WFL for tasks assessed ?RUE Deficits / Details: limited ROM in hands - pt states this is baseline. RUE is overall WFL ?LUE Deficits / Details: limited ROM in hands - pt states this is baseline. LUE is overal WFL ?  ?Lower Extremity Assessment ?Lower Extremity Assessment: Defer to PT evaluation ?  ?  ?  ? ?Vision   ?Vision Assessment?: No apparent visual deficits ?  ?Perception Perception ?Perception: Within Functional Limits ?  ?  Praxis Praxis ?Praxis: Intact ?  ? ?Cognition Arousal/Alertness: Awake/alert ?Behavior During Therapy: [facility] for tasks assessed/performed ?Overall Cognitive Status: Impaired/Different from baseline ?Area of Impairment:  Following commands, Safety/judgement, Awareness, Problem solving ?  ?  ?  ?  ?  ?  ?  ?  ?  ?  ?  ?Following Commands: Follows one step commands consistently ?Safety/Judgement: Decreased awareness of safety, Decreased awareness of deficits ?Awareness: Emergent ?Problem Solving: Slow processing, Requires verbal cues ?General Comments: required cues for problem solving, limited insight ?  ?  ?   ?   ?   ?General Comments VSS on 2L  ? ? ?Pertinent Vitals/ Pain       Pain Assessment ?Pain Assessment: Faces ?Faces Pain Scale: Hurts little more ?Pain Location: R toe? ?Pain Descriptors / Indicators: Guarding, Grimacing, Discomfort ?Pain Intervention(s): Monitored during session, Limited activity within patient's tolerance ? ? ?Frequency ? Min 2X/week  ? ? ? ? ?  ?Progress Toward Goals ? ?OT Goals(current goals can now be found in the care plan section) ? Progress towards OT goals: Progressing toward goals ? ?Acute Rehab OT Goals ?Patient Stated Goal: home ?OT Goal Formulation: With patient ?Time For Goal Achievement: 10/24/21 ?Potential to Achieve Goals: Good ?ADL Goals ?Pt Will Perform Grooming: Independently;standing ?Pt Will Transfer to Toilet: ambulating;with modified independence ?Pt Will Perform Toileting - Clothing Manipulation and hygiene: with modified independence;sitting/lateral leans ?Additional ADL Goal #1: Pt will indep complete a 3 step ADL tasks in a nondistracting environment  ?Plan Discharge plan remains appropriate   ? ?   ?AM-PAC OT "6 Clicks" Daily Activity     ?Outcome Measure ? ? Help from another person eating meals?: None ?Help from another person taking care of personal grooming?: A Little ?Help from another person toileting, which includes using toliet, bedpan, or urinal?: A Lot ?Help from another person bathing (including washing, rinsing, drying)?: A Lot ?Help from another person to put on and taking off regular upper body clothing?: A Little ?Help from another person to put on and taking off  regular lower body clothing?: A Lot ?6 Click Score: 16 ? ?  ?End of Session Equipment Utilized During Treatment: Gait belt;Rolling walker (2 wheels) ? ?OT Visit Diagnosis: Unsteadiness on feet (R26.81);Other abnormalities of gait and mobility (R26.89);Muscle weakness (generalized) (M62.81) ?  ?Activity Tolerance   ?  ?Patient Left in chair;with call bell/phone within reach;with chair alarm set;with family/visitor present ?  ?Nurse Communication Mobility status ?  ? ?   ? ?Time: 5621-3086 ?OT Time Calculation (min): 20 min ? ?Charges: OT General Charges ?$OT Visit: 1 Visit ?OT Treatments ?$Therapeutic Activity: 8-22 mins ? ? ?Codey Burling A Graeson Nouri ?10/11/2021, 1:25 PM ?

## 2021-10-11 NOTE — Progress Notes (Signed)
Physical Therapy Treatment ?Patient Details ?Name: Steven Barnett ?MRN: [id number] ?DOB: 06-25-1951 ?Today's Date: 10/11/2021 ? ? ?History of Present Illness 71 y.o. male presents to [facility] hospital as a transfer from [place] on 10/09/2021 with AMS, incontinence and weakness. 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 Pt [place] a seizure when [place]. PMH includes Afib on coumadin, colon cancer, Diastolic CHF, HTN, OSA. ? ?  ?PT Comments  ? ? Patient progressing to hallway ambulation this session, but limited by pain in multiple joints and feet.  Family in the room and report he uses Voltaren at home.  Also plan to use shoes next session.  Patient also limited by fatigue with HR up to 120's and needing time to rest for SpO2 up to 91% while on RA.  Patient should continue to progress to tolerate home with HHPT, but continues to need skilled PT in the acute setting to progress mobility as tolerate.   ?Recommendations for follow up therapy are one component of a multi-disciplinary discharge planning process, led by the attending physician.  Recommendations may be updated based on patient status, additional functional criteria and insurance authorization. ? ?Follow Up Recommendations ? Home health PT ?  ?  ?Assistance Recommended at Discharge Intermittent Supervision/Assistance  ?Patient can return home with the following A lot of help with walking and/or transfers;A lot of help with bathing/dressing/bathroom;Assistance with cooking/housework;Direct supervision/assist for medications management;Direct supervision/assist for financial management;Assist for transportation;Help with stairs or ramp for entrance ?  ?Equipment Recommendations ?    ?  ?Recommendations for Other Services   ? ? ?  ?Precautions / Restrictions Precautions ?Precautions: Fall ?Restrictions ?Weight Bearing Restrictions: No  ?  ? ?Mobility ? Bed Mobility ?  ?  ?  ?  ?  ?  ?  ?General bed mobility comments: in recliner ?   ? ?Transfers ?Overall transfer level: Needs assistance ?Equipment used: Rolling walker (2 wheels) ?Transfers: Sit to/from Stand ?Sit to Stand: Max assist ?  ?  ?  ?  ?  ?General transfer comment: from recliner multiple attempts and lifting help with increased time due to stiffiness in back, knees, etc ?  ? ?Ambulation/Gait ?Ambulation/Gait assistance: Min assist, Mod assist ?Gait Distance (Feet): 80 Feet ?Assistive device: Rolling walker (2 wheels) ?Gait Pattern/deviations: Step-through pattern, Decreased stride length, Shuffle, Wide base of support, Antalgic ?  ?  ?  ?General Gait Details: increased lateral sway, antalgic states feels like something sharp under his feet ? ? ?Stairs ?  ?  ?  ?  ?  ? ? ?Wheelchair Mobility ?  ? ?Modified Rankin (Stroke Patients Only) ?  ? ? ?  ?Balance Overall balance assessment: Needs assistance ?Sitting-balance support: Feet supported ?Sitting balance-Leahy Scale: Fair ?  ?  ?Standing balance support: Bilateral upper extremity supported ?Standing balance-Leahy Scale: Poor ?  ?  ?  ?  ?  ?  ?  ?  ?  ?  ?  ?  ?  ? ?  ?Cognition Arousal/Alertness: Awake/alert ?Behavior During Therapy: [facility] for tasks assessed/performed ?Overall Cognitive Status: Impaired/Different from baseline ?Area of Impairment: Safety/judgement, Orientation, Memory ?  ?  ?  ?  ?  ?  ?  ?  ?Orientation Level: Disoriented to, Situation ?  ?Memory: Decreased short-term memory ?  ?Safety/Judgement: Decreased awareness of deficits ?  ?Problem Solving: Slow processing, Decreased initiation ?General Comments: repeats need for shoes, Voltaren due to foot pain every few minutes ?  ?  ? ?  ?  Exercises   ? ?  ?General Comments General comments (skin integrity, edema, etc.): on RA with ambulation SpO2 not reading due to using walker, up to 91% seated prior to replacing O2 @ 2LPM, HR up to 123 ?  ?  ? ?Pertinent Vitals/Pain Pain Assessment ?Pain Assessment: Faces ?Faces Pain Scale: Hurts whole lot ?Pain Location: back, knees,  hips, feet ?Pain Descriptors / Indicators: Grimacing, Discomfort, Sore, Moaning ?Pain Intervention(s): Monitored during session, Repositioned, Limited activity within patient's tolerance  ? ? ?Home Living   ?  ?  ?  ?  ?  ?  ?  ?  ?  ?   ?  ?Prior Function    ?  ?  ?   ? ?PT Goals (current goals can now be found in the care plan section) Progress towards PT goals: Progressing toward goals ? ?  ?Frequency ? ? ? Min 4X/week ? ? ? ?  ?PT Plan Current plan remains appropriate  ? ? ?Co-evaluation   ?  ?  ?  ?  ? ?  ?AM-PAC PT "6 Clicks" Mobility   ?Outcome Measure ? Help needed turning from your back to your side while in a flat bed without using bedrails?: A Little ?Help needed moving from lying on your back to sitting on the side of a flat bed without using bedrails?: A Lot ?Help needed moving to and from a bed to a chair (including a wheelchair)?: A Lot ?  ?Help needed to walk in hospital room?: A Little ?Help needed climbing 3-5 steps with a railing? : Total ?6 Click Score: 11 ? ?  ?End of Session Equipment Utilized During Treatment: Gait belt ?Activity Tolerance: Patient limited by fatigue;Patient limited by pain ?Patient left: in bed;with nursing/sitter in room;in CPM;with family/visitor present (seated EOB for bath in NT in the room) ?  ?PT Visit Diagnosis: Other abnormalities of gait and mobility (R26.89);Muscle weakness (generalized) (M62.81);Other symptoms and signs involving the nervous system (R29.898) ?  ? ? ?Time: 1310-1325 ?PT Time Calculation (min) (ACUTE ONLY): 15 min ? ?Charges:  $Gait Training: 8-22 mins          ?          ? ?Magda Kiel, PT ?Acute Rehabilitation Services ?GLOVF:[phone] ?Office:[phone] ?10/11/2021 ? ? ? ?Steven Barnett ?10/11/2021, 1:31 PM ? ?

## 2021-10-11 NOTE — Progress Notes (Addendum)
STROKE TEAM PROGRESS NOTE  ? ?INTERVAL HISTORY ?Steven Barnett is a 71 y.o. male PMH of Afib on coumadin, colon cancer, Diastolic CHF, HTN, OSA, came in to [facility] for evaluation of AMS. CT head with acute small intraparenchymal hematoma within the left temporal occipital junction just inferior to the atrium and occipital horn of the left lateral ventricle. CTA head and neck negative for underlying vascular malformation. Coumadin reversed. Had a witnessed GTC event with R gaze deviation for which he was Keppra loaded and remains on 500 mg BID.   ? ?Patient is seen this morning in his room with no family at bedside. He is alert and oriented to person, place, and time but not events. He is unable to provide a good history. He denies new complaints and says he is at his baseline.  Blood pressure adequately controlled. ? ?Vitals:  ? 10/11/21 1000 10/11/21 1015 10/11/21 1030 10/11/21 1156  ?BP: (!) 151/93 (!) 148/90 (!) 143/106   ?Pulse: 88 88 83   ?Resp: (!) 26 (!) 27 (!) 22   ?Temp:    98.1 ?F (36.7 ?C)  ?TempSrc:    Oral  ?SpO2: 93% 91% 94%   ? ?CBC:  ?Recent Labs  ?Lab 10/09/21 ?1646 10/10/21 ?0434 10/11/21 ?0228  ?WBC 6.4 7.8 7.6  ?NEUTROABS 5.4  --   --   ?HGB 15.4 14.7 14.7  ?HCT 47.4 45.3 45.0  ?MCV 83.3 81.8 80.8  ?PLT 117* 130* 125*  ? ?Basic Metabolic Panel:  ?Recent Labs  ?Lab 10/10/21 ?0434 10/11/21 ?0228  ?NA 136 136  ?K 3.4* 3.6  ?CL 100 103  ?CO2 27 26  ?GLUCOSE 84 86  ?BUN 9 10  ?CREATININE 0.85 0.92  ?CALCIUM 8.8* 9.0  ? ?Lipid Panel:  ?Recent Labs  ?Lab 10/10/21 ?9983  ?CHOL 125  ?TRIG 86  ?HDL 46  ?CHOLHDL 2.7  ?VLDL 17  ?[place] 62  ? ?HgbA1c:  ?Recent Labs  ?Lab 10/10/21 ?3825  ?HGBA1C 5.0  ? ?Urine Drug Screen:  ?Recent Labs  ?Lab 10/09/21 ?1727  ?LABOPIA NONE DETECTED  ?COCAINSCRNUR NONE DETECTED  ?LABBENZ NONE DETECTED  ?AMPHETMU NONE DETECTED  ?THCU NONE DETECTED  ?LABBARB NONE DETECTED  ?  ?Alcohol Level  ?Recent Labs  ?Lab 10/09/21 ?1646  ?ETH <10  ? ? ?IMAGING past 24 hours ?ECHOCARDIOGRAM  COMPLETE ? ?Result Date: 10/10/2021 ?   ECHOCARDIOGRAM REPORT   Patient Name:   Steven Barnett Date of Exam: 10/10/2021 Medical Rec #:  [id number]      Height:       70.0 in Accession #:    [id number]     Weight:       280.0 lb Date of Birth:  1950/12/16      BSA:          2.408 m? Patient Age:    38 years       BP:           120/74 mmHg Patient Gender: M              HR:           79 bpm. Exam Location:  Inpatient Procedure: 2D Echo, Color Doppler and Cardiac Doppler Indications:    Stroke i63.9  History:        Patient has prior history of Echocardiogram examinations, most                 recent 11/04/2019. CHF, Arrythmias:Atrial Fibrillation; Risk  Factors:Hypertension and Sleep Apnea.  Sonographer:    Raquel Sarna Senior RDCS Referring Phys: [id number] ASHISH ARORA  Sonographer Comments: Technically difficult due to body habitus. IMPRESSIONS  1. Left ventricular ejection fraction, by estimation, is 60 to 65%. The left ventricle has normal function. Left ventricular endocardial border not optimally defined to evaluate regional wall motion. There is mild concentric left ventricular hypertrophy. Left ventricular diastolic function could not be evaluated.  2. Right ventricular systolic function is normal. The right ventricular size is normal. There is normal pulmonary artery systolic pressure. The estimated right ventricular systolic pressure is 92.4 mmHg.  3. Left atrial size was mildly dilated.  4. The mitral valve is grossly normal. No evidence of mitral valve regurgitation. No evidence of mitral stenosis.  5. The aortic valve is tricuspid. Aortic valve regurgitation is not visualized. No aortic stenosis is present.  6. The inferior vena cava is dilated in size with >50% respiratory variability, suggesting right atrial pressure of 8 mmHg. Comparison(s): No significant change from prior study. Conclusion(s)/Recommendation(s): No intracardiac source of embolism detected on this transthoracic study. Consider a  transesophageal echocardiogram to exclude cardiac source of embolism if clinically indicated. FINDINGS  Left Ventricle: Left ventricular ejection fraction, by estimation, is 60 to 65%. The left ventricle has normal function. Left ventricular endocardial border not optimally defined to evaluate regional wall motion. The left ventricular internal cavity size was normal in size. There is mild concentric left ventricular hypertrophy. Left ventricular diastolic function could not be evaluated due to atrial fibrillation. Left ventricular diastolic function could not be evaluated. Right Ventricle: The right ventricular size is normal. No increase in right ventricular wall thickness. Right ventricular systolic function is normal. There is normal pulmonary artery systolic pressure. The tricuspid regurgitant velocity is 2.30 m/s, and  with an assumed right atrial pressure of 8 mmHg, the estimated right ventricular systolic pressure is 26.8 mmHg. Left Atrium: Left atrial size was mildly dilated. Right Atrium: Right atrial size was normal in size. Pericardium: There is no evidence of pericardial effusion. Mitral Valve: The mitral valve is grossly normal. No evidence of mitral valve regurgitation. No evidence of mitral valve stenosis. Tricuspid Valve: The tricuspid valve is grossly normal. Tricuspid valve regurgitation is not demonstrated. No evidence of tricuspid stenosis. Aortic Valve: The aortic valve is tricuspid. Aortic valve regurgitation is not visualized. No aortic stenosis is present. Pulmonic Valve: The pulmonic valve was grossly normal. Pulmonic valve regurgitation is not visualized. No evidence of pulmonic stenosis. Aorta: The aortic root is normal in size and structure. Venous: The inferior vena cava is dilated in size with greater than 50% respiratory variability, suggesting right atrial pressure of 8 mmHg. IAS/Shunts: The atrial septum is grossly normal.  LEFT VENTRICLE PLAX 2D LVIDd:         4.00 cm LVIDs:          2.70 cm LV PW:         1.30 cm LV IVS:        1.20 cm LVOT diam:     2.10 cm LV SV:         49 LV SV Index:   20 LVOT Area:     3.46 cm?  RIGHT VENTRICLE RV S prime:     7.28 cm/s TAPSE (M-mode): 1.8 cm LEFT ATRIUM              Index        RIGHT ATRIUM           Index  LA diam:        5.00 cm  2.08 cm/m?   RA Area:     23.10 cm? LA Vol (A2C):   122.0 ml 50.66 ml/m?  RA Volume:   68.00 ml  28.24 ml/m? LA Vol (A4C):   86.3 ml  35.84 ml/m? LA Biplane Vol: 105.0 ml 43.60 ml/m?  AORTIC VALVE LVOT Vmax:   66.60 cm/s LVOT Vmean:  49.800 cm/s LVOT VTI:    0.142 m TRICUSPID VALVE TR Peak grad:   21.2 mmHg TR Vmax:        230.00 cm/s  SHUNTS Systemic VTI:  0.14 m Systemic Diam: 2.10 cm Eleonore Chiquito MD Electronically signed by Eleonore Chiquito MD Signature Date/Time: 10/10/2021/2:41:41 PM    Final    ? ?PHYSICAL EXAM ? ?Physical Exam  ?Constitutional: Appears well-developed and well-nourished.  ?Psych: Affect appropriate to situation ?Cardiovascular: Normal rate and regular rhythm.  ?Respiratory: Effort normal, non-labored breathing ? ?Neuro: ?Mental Status: ?As above ?Cranial Nerves: ?II: Visual Fields are full. Pupils are round, and reactive to light. R pupil 0.5 mm larger than L.  ?III,IV, VI: EOMI without ptosis or diploplia.  ?V: Facial sensation is symmetric to temperature ?VII: Facial movement is symmetric resting and smiling ?VIII: Hearing is intact to voice ?X: Palate elevates symmetrically ?XI: Shoulder shrug is symmetric. ?XII: Tongue protrudes midline without atrophy or fasciculations.  ?Motor: ?Tone is normal. Bulk is normal. Strength is 5/5 in all extremities.  ?Sensory: ?Sensation is symmetric to light touch and temperature in the arms and legs. ?Cerebellar: ?FNF intact ? ? ? ?ASSESSMENT/PLAN ?NAPHTALI ZYWICKI is a 71 y.o. male PMH of Afib on coumadin, colon cancer, Diastolic CHF, HTN, OSA, came in to [facility] for evaluation of AMS. CT head with acute small intraparenchymal hematoma within the left temporal occipital  junction just inferior to the atrium and occipital horn of the left lateral ventricle. CTA head and neck negative for underlying vascular malformation. Coumadin reversed. Had a witnessed GTC event with R gaze deviatio

## 2021-10-12 DIAGNOSIS — I610 Nontraumatic intracerebral hemorrhage in hemisphere, subcortical: Secondary | ICD-10-CM | POA: Diagnosis not present

## 2021-10-12 LAB — GLUCOSE, CAPILLARY
Glucose-Capillary: 89 mg/dL (ref 70–99)
Glucose-Capillary: 124 mg/dL — ABNORMAL HIGH (ref 70–99)
Glucose-Capillary: 84 mg/dL (ref 70–99)
Glucose-Capillary: 122 mg/dL — ABNORMAL HIGH (ref 70–99)

## 2021-10-12 MED ORDER — AMLODIPINE BESYLATE 5 MG PO TABS
5.0000 mg | ORAL_TABLET | Freq: Once | ORAL | Status: AC
  Administered 2021-10-12: 5 mg via ORAL
  Filled 2021-10-12: qty 1

## 2021-10-12 MED ORDER — AMLODIPINE BESYLATE 10 MG PO TABS
10.0000 mg | ORAL_TABLET | Freq: Every day | ORAL | Status: DC
Start: 2021-10-13 — End: 2021-10-13

## 2021-10-12 MED ORDER — DILTIAZEM HCL 30 MG PO TABS
30.0000 mg | ORAL_TABLET | Freq: Two times a day (BID) | ORAL | Status: AC
  Administered 2021-10-12: 30 mg via ORAL
  Filled 2021-10-12: qty 1

## 2021-10-12 MED ORDER — DICLOFENAC SODIUM 1 % EX GEL
2.0000 g | Freq: Four times a day (QID) | CUTANEOUS | Status: DC | PRN
  Administered 2021-10-12 – 2021-10-13 (×3): 2 g via TOPICAL
  Filled 2021-10-12: qty 100

## 2021-10-12 MED ORDER — DILTIAZEM HCL ER COATED BEADS 120 MG PO CP24
120.0000 mg | ORAL_CAPSULE | Freq: Every day | ORAL | Status: DC
  Administered 2021-10-13 – 2021-10-16 (×4): 120 mg via ORAL
  Filled 2021-10-12 (×4): qty 1

## 2021-10-12 NOTE — Progress Notes (Signed)
Transferred patient to 3W05 with belongings and without incident. Attempted to contact daughter to inform her of room change, however her inbox was full and I was unable to leave a message. ?

## 2021-10-12 NOTE — TOC Initial Note (Addendum)
Transition of Care (TOC) - Initial/Assessment Note  ? ? ?Patient Details  ?Name: Steven Barnett ?MRN: [id number] ?Date of Birth: 1951/01/02 ? ?Transition of Care (TOC) CM/SW Contact:    ?Benard Halsted, LCSW ?Phone Number: ?10/12/2021, 1:29 PM ? ?Clinical Narrative:                 ?CSW received consult for possible SNF services at time of discharge. CSW spoke with patient and daughter at bedside. Patient and daughter reported that he would like to return home at discharge rather than go to SNF. Daughter reports he will be staying downstairs. CSW described limitations of home health services and daughter requested outpatient therapy instead through [facility] in [place] since her mother did that before she passed away and they worked hard. She reported she feels comfortable transporting him in his current state. CSW discussed equipment needs and she requested a wheelchair and urinal as patient already has a rolling walker, bedside commode, and shower chair from patient's spouse. CSW confirmed PCP and address with patient. ? ? ?Expected Discharge Plan: OP Rehab ?Barriers to Discharge: Continued Medical Work up ? ? ?Patient Goals and CMS Choice ?Patient states their goals for this hospitalization and ongoing recovery are:: Rehab ?CMS Medicare.gov Compare Post Acute Care list provided to:: Patient ?Choice offered to / list presented to : Patient, Adult Children ? ?Expected Discharge Plan and Services ?Expected Discharge Plan: OP Rehab ?In-house Referral: Clinical Social Work ?  ?Post Acute Care Choice:  (OP Therapy) ?Living arrangements for the past 2 months: [place] ?                ?  ?  ?  ?  ?  ?  ?  ?  ?  ?  ? ?Prior Living Arrangements/Services ?Living arrangements for the past 2 months: [place] ?Lives with:: Adult Children ?Patient language and need for interpreter reviewed:: Yes ?Do you feel safe going back to the place where you live?: Yes      ?Need for Family Participation in  Patient Care: Yes (Comment) ?Care giver support system in place?: Yes (comment) ?Current home services: DME ?Criminal Activity/Legal Involvement Pertinent to Current Situation/Hospitalization: No - Comment as needed ? ?Activities of Daily Living ?  ?  ? ?Permission Sought/Granted ?Permission sought to share information with : Facility Sport and exercise psychologist, Family Supports ?Permission granted to share information with : Yes, Verbal Permission Granted ? Share Information with NAME: [name] ? Permission granted to share info w AGENCY: OP Rehab ? Permission granted to share info w Relationship: Daughter ? Permission granted to share info w Contact Information: [phone] ? ?Emotional Assessment ?Appearance:: Appears stated age ?Attitude/Demeanor/Rapport: Engaged ?Affect (typically observed): Accepting, Appropriate, Quiet ?Orientation: : Oriented to Self, Oriented to Place, Oriented to  Time, Oriented to Situation ?Alcohol / Substance Use: Not Applicable ?Psych Involvement: No (comment) ? ?Admission diagnosis:  Seizure ([place]) [R56.9] ?ICH (intracerebral hemorrhage) ([place]) [I61.9] ?Nontraumatic intracranial hemorrhage (HCC) [I62.9] ?Hypertensive emergency [I16.1] ?Patient Active Problem List  ? Diagnosis Date Noted  ? ICH (intracerebral hemorrhage) ([place]) 10/09/2021  ? Hypovolemia 01/14/2021  ? Hypokalemia 01/14/2021  ? Hypomagnesemia 01/14/2021  ? Normocytic anemia 01/14/2021  ? Gait instability 01/14/2021  ? Encounter for therapeutic drug monitoring 10/12/2018  ? Hypotension 08/17/2016  ? SIRS (systemic inflammatory response syndrome) ([place]) 08/17/2016  ? Influenza A 08/17/2016  ? Septic shock ([place]) 08/10/2016  ? Neutropenia, drug-induced ([place])   ? Gastroenteritis due to norovirus   ?  Acute kidney injury ([place])   ? Genetic testing 07/01/2016  ? Port catheter in place 06/29/2016  ? Family history of colon cancer 06/08/2016  ? Family history of colonic polyps 06/08/2016  ? Cancer of right colon ([place]) 04/12/2016  ?  Pre-operative cardiovascular examination 04/06/2016  ? Acute on chronic diastolic heart failure ([place]) 02/02/2016  ? Acute respiratory failure with hypoxia ([place]) 02/02/2016  ? Anemia, iron deficiency 02/02/2016  ? Current use of long term anticoagulation 12/03/2015  ? Pneumonia 09/30/2015  ? PNA (pneumonia) 09/30/2015  ? Acute on chronic congestive heart failure ([place])   ? CHF exacerbation ([place]) 07/08/2015  ? Community acquired pneumonia 02/10/2015  ? ED (erectile dysfunction) 08/12/2014  ? Arthralgia of both knees 04/08/2014  ? Chronic diastolic (congestive) heart failure ([place]) 01/14/2009  ? Morbid obesity ([place]) 12/31/2008  ? Obstructive sleep apnea 12/31/2008  ? Essential hypertension, benign 12/31/2008  ? Atrial fibrillation ([place]) 12/31/2008  ? ?PCP:  Elisabeth Cara, PA-C ?Pharmacy:   ?Insurance claims handler [address] [place], [place] - [address] ?[place] ?[place] [address] ?Phone: [phone] Fax: [phone] ? ?[place], [place] ?[place] ?[place] [address] ?Phone: [phone] Fax: [phone] ? ? ? ? ?Social Determinants of Health (SDOH) Interventions ?  ? ?Readmission Risk Interventions ?   ? View : No data to display.  ?  ?  ?  ? ? ? ?

## 2021-10-12 NOTE — Progress Notes (Signed)
Called by RN ? ?Temp 100.4 ?HR 120-140 ?Got his Ca channel blocker.  ?Will monitor. ?If spikes fever, will panculture and request medicine consult. ?-- ?Amie Portland, MD ?Neurologist ?[facility] ?Pager: [phone] ? ?

## 2021-10-12 NOTE — Plan of Care (Signed)
?  Problem: Education: ?Goal: Knowledge of disease or condition will improve ?Outcome: Progressing ?  ?Problem: Health Behavior/Discharge Planning: ?Goal: Ability to manage health-related needs will improve ?Outcome: Progressing ?  ?Problem: Intracerebral Hemorrhage Tissue Perfusion: ?Goal: Complications of Intracerebral Hemorrhage will be minimized ?Outcome: Progressing ?  ?

## 2021-10-12 NOTE — Progress Notes (Signed)
Physical Therapy Treatment ?Patient Details ?Name: Steven Barnett ?MRN: [id number] ?DOB: 1950-12-10 ?Today's Date: 10/12/2021 ? ? ?History of Present Illness 71 y.o. male presents to [facility] hospital as a transfer from [place] on 10/09/2021 with AMS, incontinence and weakness. 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 Pt experienced a seizure when at [place]. PMH includes Afib on coumadin, colon cancer, Diastolic CHF, HTN, OSA. ? ?  ?PT Comments  ? ? Patient not progressing this session due to significant pain in joints, specifically both knees with standing and ambulation attempts.  Reports may have had gout in the past and downward progression of mobility similar in this instance to potential flare.  Patient likely not ready for home and may need STSNF level rehab (not able right now to tolerate intensity of AIR due to pain.)  PT will continue to follow acutely.   ?Recommendations for follow up therapy are one component of a multi-disciplinary discharge planning process, led by the attending physician.  Recommendations may be updated based on patient status, additional functional criteria and insurance authorization. ? ?Follow Up Recommendations ? Skilled nursing-short term rehab (<3 hours/day) ?  ?  ?Assistance Recommended at Discharge Frequent or constant Supervision/Assistance  ?Patient can return home with the following A lot of help with bathing/dressing/bathroom;Assistance with cooking/housework;Direct supervision/assist for medications management;Direct supervision/assist for financial management;Assist for transportation;Help with stairs or ramp for entrance;Two people to help with walking and/or transfers ?  ?Equipment Recommendations ? None recommended by PT  ?  ?Recommendations for Other Services   ? ? ?  ?Precautions / Restrictions Precautions ?Precautions: Fall  ?  ? ?Mobility ? Bed Mobility ?Overal bed mobility: Needs Assistance ?Bed Mobility: Supine to Sit ?  ?  ?Supine to sit: Mod assist ?  ?   ?General bed mobility comments: assist with cues, using bed pad to help pt to roll and inch legs off the bed, assist to lift trunk upright ?  ? ?Transfers ?Overall transfer level: Needs assistance ?Equipment used: Rolling walker (2 wheels) ?Transfers: Sit to/from Stand ?  ?  ?Step pivot transfers: +2 physical assistance, From elevated surface, Mod assist, Max assist ?  ?  ?  ?General transfer comment: max A of 1 after multiple attempts from increasingly higher surface, but pt requesting each time to sit back down, then with +2 mod A able to maintain standing longer to take steps ?  ? ?Ambulation/Gait ?Ambulation/Gait assistance: Mod assist, +2 safety/equipment ?Gait Distance (Feet): 2 Feet ?Assistive device: Rolling walker (2 wheels) ?Gait Pattern/deviations: Step-to pattern, Decreased stride length, Wide base of support, Trunk flexed, Decreased weight shift to left ?  ?  ?  ?General Gait Details: c/o pain in knees especially with L weight shift to take steps, unable to walk further so assisted to chair ? ? ?Stairs ?  ?  ?  ?  ?  ? ? ?Wheelchair Mobility ?  ? ?Modified Rankin (Stroke Patients Only) ?Modified Rankin (Stroke Patients Only) ?Pre-Morbid Rankin Score: No significant disability ?Modified Rankin: Moderately severe disability ? ? ?  ?Balance Overall balance assessment: Needs assistance ?Sitting-balance support: Feet supported ?Sitting balance-Leahy Scale: Fair ?  ?  ?Standing balance support: Bilateral upper extremity supported ?Standing balance-Leahy Scale: Poor ?Standing balance comment: UE support and min to mod A ?  ?  ?  ?  ?  ?  ?  ?  ?  ?  ?  ?  ? ?  ?Cognition Arousal/Alertness: Awake/alert ?Behavior During Therapy: [facility] for tasks  assessed/performed ?Overall Cognitive Status: Impaired/Different from baseline ?Area of Impairment: Safety/judgement, Orientation, Memory ?  ?  ?  ?  ?  ?  ?  ?  ?Orientation Level: Disoriented to, Situation ?  ?Memory: Decreased short-term memory ?Following Commands:  Follows one step commands consistently ?Safety/Judgement: Decreased awareness of deficits ?  ?Problem Solving: Slow processing, Decreased initiation ?General Comments: did not remember walking with PT prior to today ?  ?  ? ?  ?Exercises   ? ?  ?General Comments General comments (skin integrity, edema, etc.): Shoes applied prior to sit to stand and several knee ROM Exercises performed sitting, but still difficulty trying to get upright ?  ?  ? ?Pertinent Vitals/Pain Pain Assessment ?Faces Pain Scale: Hurts whole lot ?Pain Location: knees ?Pain Descriptors / Indicators: Grimacing, Discomfort, Sore, Moaning ?Pain Intervention(s): Monitored during session, Repositioned, Premedicated before session  ? ? ?Home Living Family/patient expects to be discharged to:: Private residence ?Living Arrangements: Children ?  ?  ?  ?  ?  ?  ?  ?  ?   ?  ?Prior Function    ?  ?  ?   ? ?PT Goals (current goals can now be found in the care plan section) Progress towards PT goals: Not progressing toward goals - comment ? ?  ?Frequency ? ? ?   ? ? ? ?  ?PT Plan Discharge plan needs to be updated  ? ? ?Co-evaluation   ?  ?  ?  ?  ? ?  ?AM-PAC PT "6 Clicks" Mobility   ?Outcome Measure ? Help needed turning from your back to your side while in a flat bed without using bedrails?: A Little ?Help needed moving from lying on your back to sitting on the side of a flat bed without using bedrails?: A Lot ?Help needed moving to and from a bed to a chair (including a wheelchair)?: A Lot ?Help needed standing up from a chair using your arms (e.g., wheelchair or bedside chair)?: Total ?Help needed to walk in hospital room?: Total ?Help needed climbing 3-5 steps with a railing? : Total ?6 Click Score: 10 ? ?  ?End of Session Equipment Utilized During Treatment: Gait belt ?Activity Tolerance: Patient limited by pain ?Patient left: in chair;with call bell/phone within reach;with chair alarm set ?Nurse Communication: Mobility status;Other (comment) (not  ready for d/c) ?PT Visit Diagnosis: Other abnormalities of gait and mobility (R26.89);Muscle weakness (generalized) (M62.81);Other symptoms and signs involving the nervous system (R29.898);Pain ?Pain - Right/Left: Left (& right) ?Pain - part of body: Knee ?  ? ? ?Time: 0950-1020 ?PT Time Calculation (min) (ACUTE ONLY): 30 min ? ?Charges:  $Therapeutic Activity: 23-37 mins          ?          ? ?Magda Kiel, PT ?Acute Rehabilitation Services ?KWIOX:[phone] ?[phone] ?10/12/2021 ? ? ? ?Reginia Naas ?10/12/2021, 11:18 AM ? ?

## 2021-10-12 NOTE — Progress Notes (Addendum)
STROKE TEAM PROGRESS NOTE  ? ?INTERVAL HISTORY ?Steven Barnett is a 71 y.o. male PMH of Afib on coumadin, colon cancer, Diastolic CHF, HTN, OSA, came in to [facility] for evaluation of AMS. CT head with acute small intraparenchymal hematoma within the left temporal occipital junction just inferior to the atrium and occipital horn of the left lateral ventricle. CTA head and neck negative for underlying vascular malformation. Coumadin reversed. Had a witnessed GTC event with R gaze deviation for which he was Keppra loaded and remains on 500 mg BID.   ? ?Patient is seen this morning in his room with no family at bedside. He is in moderate discomfort which he localizes to his knees. Tylenol ordered. We had been preparing for D/C but PT now recommending SNF. Patient expresses interest in the ASPIRE study. Will discuss with family.  Blood pressure adequately controlled.  Vital signs stable. ? ?Vitals:  ? 10/12/21 0700 10/12/21 0800 10/12/21 0900 10/12/21 1100  ?BP: (!) 158/82 (!) 162/104 (!) 152/85 (!) 157/97  ?Pulse: 80 92 85 (!) 121  ?Resp: (!) 24 19 (!) 29 20  ?Temp:  98.4 ?F (36.9 ?C)    ?TempSrc:  Oral    ?SpO2: 94% 94% 94% (!) 86%  ? ?CBC:  ?Recent Labs  ?Lab 10/09/21 ?1646 10/10/21 ?0434 10/11/21 ?0228  ?WBC 6.4 7.8 7.6  ?NEUTROABS 5.4  --   --   ?HGB 15.4 14.7 14.7  ?HCT 47.4 45.3 45.0  ?MCV 83.3 81.8 80.8  ?PLT 117* 130* 125*  ? ?Basic Metabolic Panel:  ?Recent Labs  ?Lab 10/10/21 ?0434 10/11/21 ?0228  ?NA 136 136  ?K 3.4* 3.6  ?CL 100 103  ?CO2 27 26  ?GLUCOSE 84 86  ?BUN 9 10  ?CREATININE 0.85 0.92  ?CALCIUM 8.8* 9.0  ? ?Lipid Panel:  ?Recent Labs  ?Lab 10/10/21 ?3818  ?CHOL 125  ?TRIG 86  ?HDL 46  ?CHOLHDL 2.7  ?VLDL 17  ?[place] 62  ? ?HgbA1c:  ?Recent Labs  ?Lab 10/10/21 ?2993  ?HGBA1C 5.0  ? ?Urine Drug Screen:  ?Recent Labs  ?Lab 10/09/21 ?1727  ?LABOPIA NONE DETECTED  ?COCAINSCRNUR NONE DETECTED  ?LABBENZ NONE DETECTED  ?AMPHETMU NONE DETECTED  ?THCU NONE DETECTED  ?LABBARB NONE DETECTED  ?  ?Alcohol Level   ?Recent Labs  ?Lab 10/09/21 ?1646  ?ETH <10  ? ? ?IMAGING past 24 hours ?No results found. ? ?PHYSICAL EXAM ? ?Physical Exam  ?Constitutional: Appears well-developed and well-nourished.  ?Psych: Affect appropriate to situation ?Cardiovascular: Normal rate and regular rhythm.  ?Respiratory: Effort normal, non-labored breathing ? ?Neuro: ?Mental Status: ?As above ?Cranial Nerves: ?II: Visual Fields are full. Pupils are round, and reactive to light. R pupil 0.5 mm larger than L.  ?III,IV, VI: EOMI without ptosis or diploplia.  ?V: Facial sensation is symmetric to temperature ?VII: Facial movement is symmetric resting and smiling ?VIII: Hearing is intact to voice ?X: Palate elevates symmetrically ?XI: Shoulder shrug is symmetric. ?XII: Tongue protrudes midline without atrophy or fasciculations.  ?Motor: ?Tone is normal. Bulk is normal. Strength difficult to assess 2/2 pain in lower extremities, but appears to be unchanged from yesterday (5/5). ?Sensory: ?Sensation is symmetric to light touch and temperature in the arms and legs. ?Cerebellar: ?FNF intact ? ? ? ?ASSESSMENT/PLAN ?Steven Barnett is a 71 y.o. male PMH of Afib on coumadin, colon cancer, Diastolic CHF, HTN, OSA, came in to [facility] for evaluation of AMS. CT head with acute small intraparenchymal hematoma within the left temporal occipital junction  just inferior to the atrium and occipital horn of the left lateral ventricle. CTA head and neck negative for underlying vascular malformation. Coumadin reversed. Had a witnessed GTC event with R gaze deviation for which he was Keppra loaded and remains on 500 mg BID.   ? 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   ?Diet  ? Diet Heart Room service appropriate? Yes with Assist; Fluid consistency: Thin  ? ?aspirin 81 mg daily and warfarin 4 mg daily prior to admission, now on No antithrombotic. ICH. Will hold warfarin for 2 wks, consider addition enrollment in ASPIRE (pending discussion with family) ?Therapy recommendations:   ?Disposition:  HHPT, may progress to OP PT ? ?Hypertension ?Home meds: NA ?Unstable ?Strict BP goal with PRNs as below ?Long-term BP goal normotensive ? ?Hyperlipidemia ?Home meds: NA ?LDL 62, goal < 70 ?No statin indicated ?Continue statin at discharge ? ?Glucose control ?Home meds:  NA ?HgbA1c 5.0, goal < 7.0 ?CBGs ?Recent Labs  ?  10/11/21 ?1953 10/12/21 ?0801 10/12/21 ?1142  ?GLUCAP 124* 89 124*  ?  ?SSI ? ?Other Stroke Risk Factors ?Advanced Age >/= 4  ?Cigarette smoker, advised to stop smoking ?ETOH use, alcohol level <10, advised to drink no more than 1 drink(s) a day ?Obesity, BMI >/= 30 associated with increased stroke risk, recommend weight loss, diet and exercise as appropriate  ?Obstructive sleep apnea, on CPAP at home ?Congestive heart failure ? ?Other Active Problems: ? ?Subcortical ICH, nontraumatic ?Treatment: ?-Admit to nicu ?-ICH Score: 0 ?-ICH Volume: 1cc  ?-BP control goal SYS 130-160 ?-PT/OT/ST  ?-Close neuro monitoring ?  ?CNS ?Seizure-focal with likely secondary generalization x2.  ?-Currently back to baseline ?-Loaded with Keppra in ER prior to transfer ?-Continue Keppra 500 twice daily ?-EEG negative for continued sz ?  ?RESP ?No acute issues.  Monitor clinically ?  ?CV ?Hypertensive Emergency ?-Aggressive BP control, goal SBP 130-160 ?-cleviprex no longer needed per RN ?-Hydral 20 mg q6hrs prn ?-amlodipine 5 mg daily ?  ?Chronic atrial -fibrillation ?-Rate control ?-Increase Cardizem back to home formulation of extended release and dose of 120 mg ?-Hold anticoagulation  given ICH ?  ?GI/GU ?No active issues ?  ?HEME ?Coagulopathy secondary to anticoagulation ?Thrombocytopenia ?-Goal INR is< 1.3 ?  ?Fluid/Electrolyte Disorders ?No active issues ?Check CMP ?  ?ID ?No active issues ?Trend vitals and CBC ?  ?Nutrition ?E66.9 Obesity  ?-diet consult ? ? ?Hospital day # 3 ? ? ?Corky Sox, MD ?PGY-1 ?I have personally obtained history,examined this patient, reviewed notes, independently viewed imaging studies, participated in medical decision making and plan of care.ROS completed by me personally and pertinent positives fully documented  I have made any additions or clarifications directly to the above note. Agree with note above.  Mobilize out of bed.  Therapy consults.  Patient initially was thinking of going home but did not do well when he walked with therapy today hence we will keep him 1 more day.  Transfer to neurology floor bed.  Blood pressure is adequately controlled.  Long discussion with patient and daughter at the bedside and answered questions.  Patient interested in  considering possible participation in the ASPIRE study (anticoagulation versus aspirin in patients with A-fib following intracerebral hemorrhage).  Will add Tylenol extra strength for his arthritic pain.This patient is critically ill and at significant risk of neurological worsening, death and care requires constant monitoring of vital signs, hemodynamics,respiratory and cardiac monitoring, extensive review of multiple databases, frequent neurological assessment, discussion with family, other specialists and medical decision making of high complexity.I have made any additions or clarifications directly to the above note.This critical care time does not reflect procedure time, or teaching time or supervisory time of PA/NP/Med Resident etc but could involve care discussion time. ? I spent 30 minutes of neurocritical care time  in the care of  this patient. ? ?  ? ?Antony Contras, MD ?Medical Director ?[facility] ?Pager: [phone] ?10/12/2021 2:26 PM ? ? ?To contact Stroke Continuity provider, please refer to [email or web address] ?After hours, contact General Neurology ? ?

## 2021-10-12 NOTE — Progress Notes (Signed)
At 2210, patient HR noted 120'-140's. Denies chest pain or discomfort. Temp at 100.4, Tylenolol given. Messaged MD via amion. ? ? ?At 2330 HR still 120's to 140's. Patient is comfortable lying on bed. Denies chest pain/discomfort. Temp at 99.5. Message MD via [place]. Will keep monitor.  ?

## 2021-10-13 ENCOUNTER — Inpatient Hospital Stay (HOSPITAL_COMMUNITY): Payer: Medicare HMO

## 2021-10-13 DIAGNOSIS — R6510 Systemic inflammatory response syndrome (SIRS) of non-infectious origin without acute organ dysfunction: Secondary | ICD-10-CM

## 2021-10-13 DIAGNOSIS — I4891 Unspecified atrial fibrillation: Secondary | ICD-10-CM | POA: Diagnosis not present

## 2021-10-13 DIAGNOSIS — E871 Hypo-osmolality and hyponatremia: Secondary | ICD-10-CM | POA: Diagnosis not present

## 2021-10-13 DIAGNOSIS — M109 Gout, unspecified: Secondary | ICD-10-CM

## 2021-10-13 DIAGNOSIS — I629 Nontraumatic intracranial hemorrhage, unspecified: Secondary | ICD-10-CM

## 2021-10-13 DIAGNOSIS — I610 Nontraumatic intracerebral hemorrhage in hemisphere, subcortical: Secondary | ICD-10-CM | POA: Diagnosis not present

## 2021-10-13 DIAGNOSIS — R569 Unspecified convulsions: Secondary | ICD-10-CM

## 2021-10-13 DIAGNOSIS — I161 Hypertensive emergency: Secondary | ICD-10-CM

## 2021-10-13 LAB — CBC
HCT: 45 % (ref 39.0–52.0)
Hemoglobin: 15.1 g/dL (ref 13.0–17.0)
MCH: 26.6 pg (ref 26.0–34.0)
MCHC: 33.6 g/dL (ref 30.0–36.0)
MCV: 79.4 fL — ABNORMAL LOW (ref 80.0–100.0)
Platelets: 128 10*3/uL — ABNORMAL LOW (ref 150–400)
RBC: 5.67 MIL/uL (ref 4.22–5.81)
RDW: 15.9 % — ABNORMAL HIGH (ref 11.5–15.5)
WBC: 15.6 10*3/uL — ABNORMAL HIGH (ref 4.0–10.5)
nRBC: 0 % (ref 0.0–0.2)

## 2021-10-13 LAB — URINALYSIS, ROUTINE W REFLEX MICROSCOPIC
Bacteria, UA: NONE SEEN
Bilirubin Urine: NEGATIVE
Glucose, UA: NEGATIVE mg/dL
Hgb urine dipstick: NEGATIVE
Ketones, ur: 5 mg/dL — AB
Leukocytes,Ua: NEGATIVE
Nitrite: NEGATIVE
Protein, ur: 100 mg/dL — AB
Specific Gravity, Urine: 1.016 (ref 1.005–1.030)
pH: 5 (ref 5.0–8.0)

## 2021-10-13 LAB — COMPREHENSIVE METABOLIC PANEL
ALT: 17 U/L (ref 0–44)
AST: 19 U/L (ref 15–41)
Albumin: 3.1 g/dL — ABNORMAL LOW (ref 3.5–5.0)
Alkaline Phosphatase: 79 U/L (ref 38–126)
Anion gap: 9 (ref 5–15)
BUN: 17 mg/dL (ref 8–23)
CO2: 22 mmol/L (ref 22–32)
Calcium: 9 mg/dL (ref 8.9–10.3)
Chloride: 102 mmol/L (ref 98–111)
Creatinine, Ser: 1.22 mg/dL (ref 0.61–1.24)
GFR, Estimated: >60 mL/min (ref >60)
Glucose, Bld: 92 mg/dL (ref 70–99)
Potassium: 3.9 mmol/L (ref 3.5–5.1)
Sodium: 133 mmol/L — ABNORMAL LOW (ref 135–145)
Total Bilirubin: 4.9 mg/dL — ABNORMAL HIGH (ref 0.3–1.2)
Total Protein: 7.3 g/dL (ref 6.5–8.1)

## 2021-10-13 LAB — PROTIME-INR
INR: 1.4 — ABNORMAL HIGH (ref 0.8–1.2)
Prothrombin Time: 16.6 seconds — ABNORMAL HIGH (ref 11.4–15.2)

## 2021-10-13 LAB — CBC WITH DIFFERENTIAL/PLATELET
Abs Immature Granulocytes: 0.1 10*3/uL — ABNORMAL HIGH (ref 0.00–0.07)
Basophils Absolute: 0.1 10*3/uL (ref 0.0–0.1)
Basophils Relative: 0 %
Eosinophils Absolute: 0 10*3/uL (ref 0.0–0.5)
Eosinophils Relative: 0 %
HCT: 45 % (ref 39.0–52.0)
Hemoglobin: 14.7 g/dL (ref 13.0–17.0)
Immature Granulocytes: 1 %
Lymphocytes Relative: 4 %
Lymphs Abs: 0.7 10*3/uL (ref 0.7–4.0)
MCH: 26.3 pg (ref 26.0–34.0)
MCHC: 32.7 g/dL (ref 30.0–36.0)
MCV: 80.4 fL (ref 80.0–100.0)
Monocytes Absolute: 1.3 10*3/uL — ABNORMAL HIGH (ref 0.1–1.0)
Monocytes Relative: 8 %
Neutro Abs: 14.6 10*3/uL — ABNORMAL HIGH (ref 1.7–7.7)
Neutrophils Relative %: 87 %
Platelets: 138 10*3/uL — ABNORMAL LOW (ref 150–400)
RBC: 5.6 MIL/uL (ref 4.22–5.81)
RDW: 15.8 % — ABNORMAL HIGH (ref 11.5–15.5)
WBC: 16.7 10*3/uL — ABNORMAL HIGH (ref 4.0–10.5)
nRBC: 0 % (ref 0.0–0.2)

## 2021-10-13 LAB — LACTIC ACID, PLASMA
Lactic Acid, Venous: 1.3 mmol/L (ref 0.5–1.9)
Lactic Acid, Venous: 2 mmol/L (ref 0.5–1.9)

## 2021-10-13 LAB — BASIC METABOLIC PANEL
Anion gap: 10 (ref 5–15)
BUN: 17 mg/dL (ref 8–23)
CO2: 21 mmol/L — ABNORMAL LOW (ref 22–32)
Calcium: 9.1 mg/dL (ref 8.9–10.3)
Chloride: 102 mmol/L (ref 98–111)
Creatinine, Ser: 1.17 mg/dL (ref 0.61–1.24)
GFR, Estimated: >60 mL/min (ref >60)
Glucose, Bld: 90 mg/dL (ref 70–99)
Potassium: 3.8 mmol/L (ref 3.5–5.1)
Sodium: 133 mmol/L — ABNORMAL LOW (ref 135–145)

## 2021-10-13 LAB — PROCALCITONIN: Procalcitonin: 3.18 ng/mL

## 2021-10-13 LAB — APTT: aPTT: 36 seconds (ref 24–36)

## 2021-10-13 LAB — GLUCOSE, CAPILLARY
Glucose-Capillary: 122 mg/dL — ABNORMAL HIGH (ref 70–99)
Glucose-Capillary: 89 mg/dL (ref 70–99)
Glucose-Capillary: 108 mg/dL — ABNORMAL HIGH (ref 70–99)
Glucose-Capillary: 85 mg/dL (ref 70–99)
Glucose-Capillary: 134 mg/dL — ABNORMAL HIGH (ref 70–99)

## 2021-10-13 LAB — RESP PANEL BY RT-PCR (FLU A&B, COVID) ARPGX2
Influenza A by PCR: NEGATIVE
Influenza B by PCR: NEGATIVE
SARS Coronavirus 2 by RT PCR: NEGATIVE

## 2021-10-13 LAB — URIC ACID: Uric Acid, Serum: 6.9 mg/dL (ref 3.7–8.6)

## 2021-10-13 IMAGING — CT CT HEAD W/O CM
4 series · 16 of 47 positions shown, 18 images · non-contrast
Comparison: Brain MRI [DATE] and earlier.

CLINICAL DATA: 70-year-old male with atrial fibrillation on
Coumadin. Code stroke presentation, with small acute subependymal
hemorrhage near the atrium of the left lateral ventricle.



[Series 2: head without · axial · non-contrast · 0.50mm/px · z∈[-490,-354]mm · 7 of 37 slices shown, 9 images]
[im 5/37  brain]
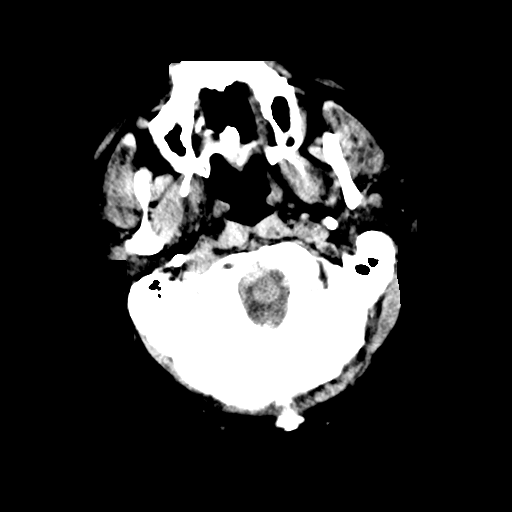
[im 5/37  bone]
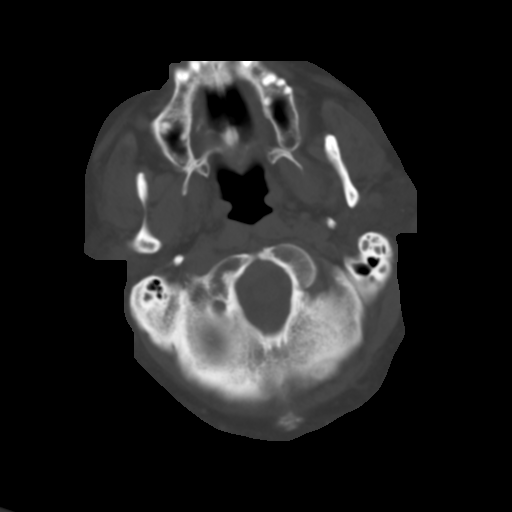
[im 10/37  brain]
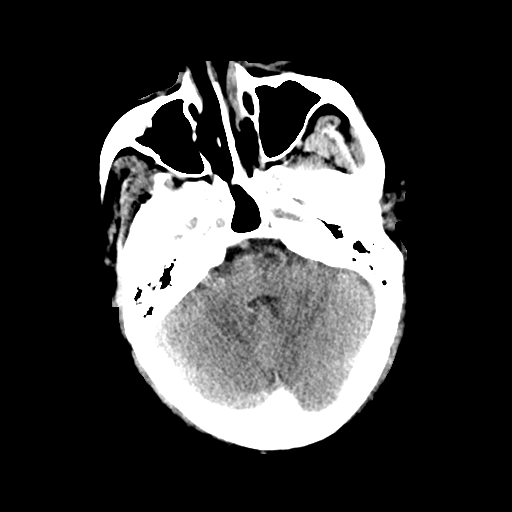
[im 14/37  brain]
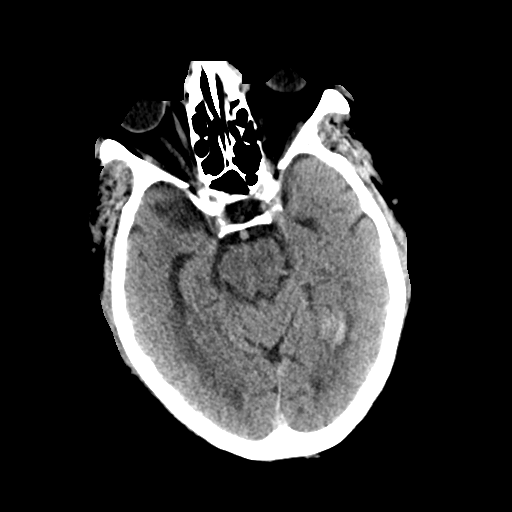
[im 19/37  brain]
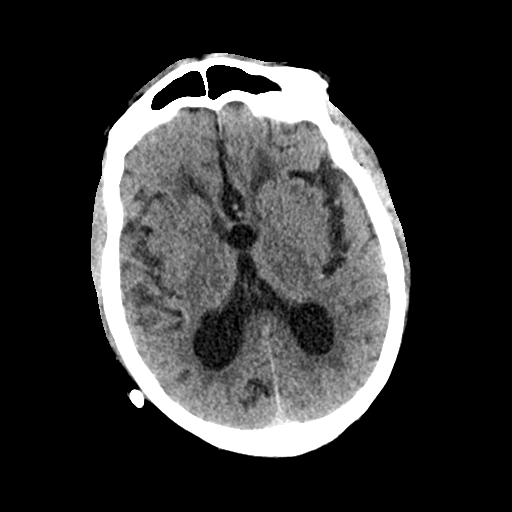
[im 23/37  brain]
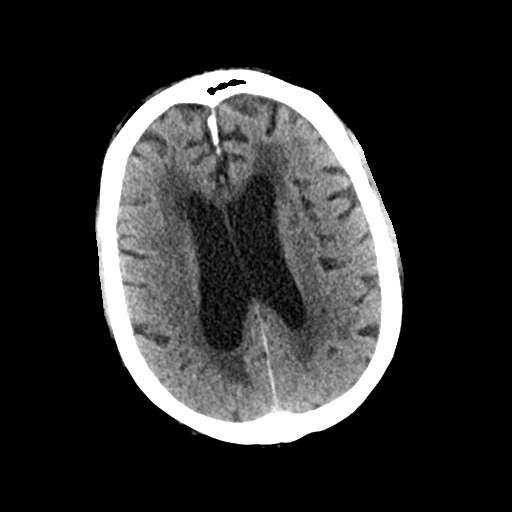
[im 23/37  bone]
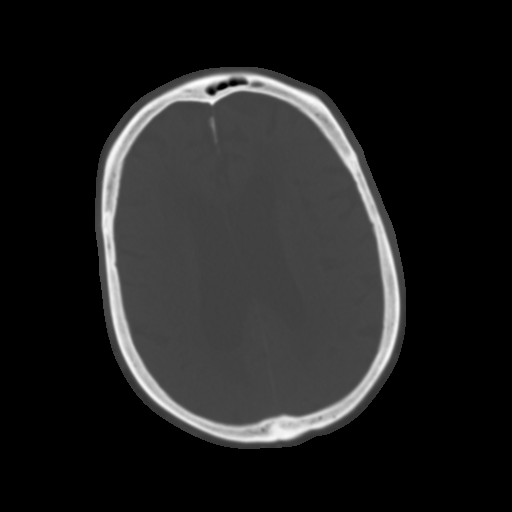
[im 28/37  brain]
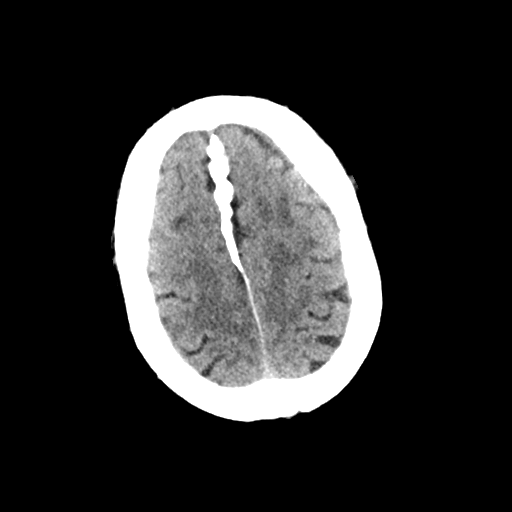
[im 32/37  brain]
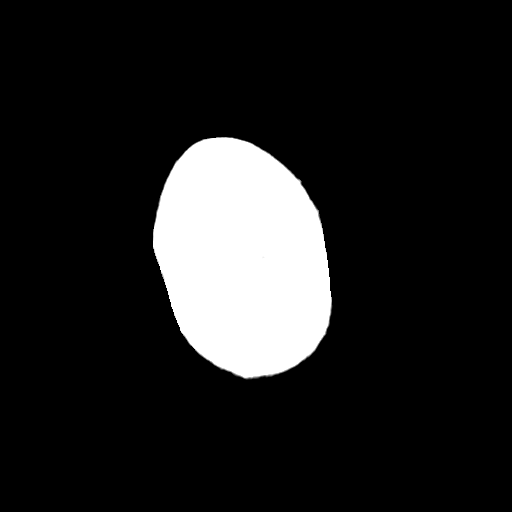

[Series 3: head bone · axial · 0.50mm/px · z∈[-492,-456]mm · 3 of 93 slices shown]
[im 10/93  bone]
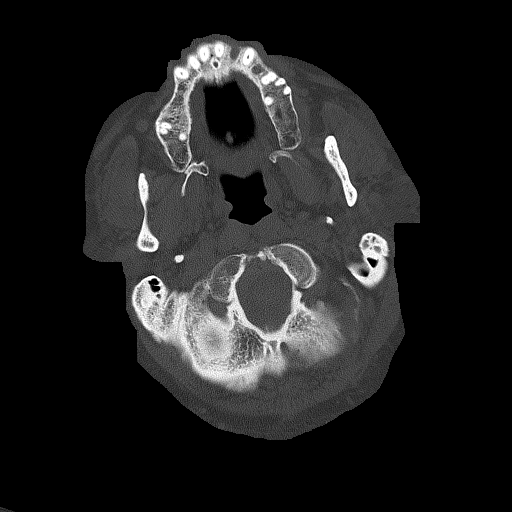
[im 19/93  bone]
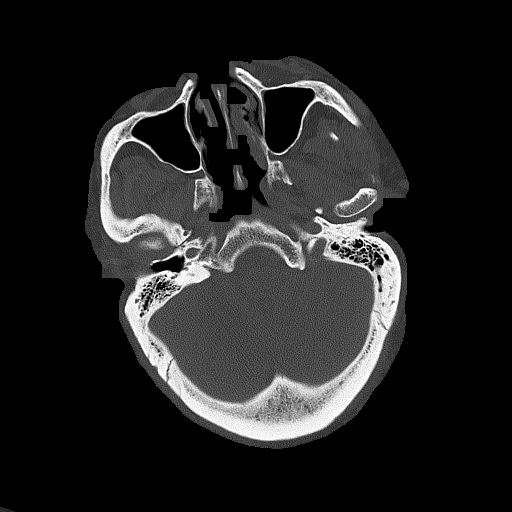
[im 28/93  bone]
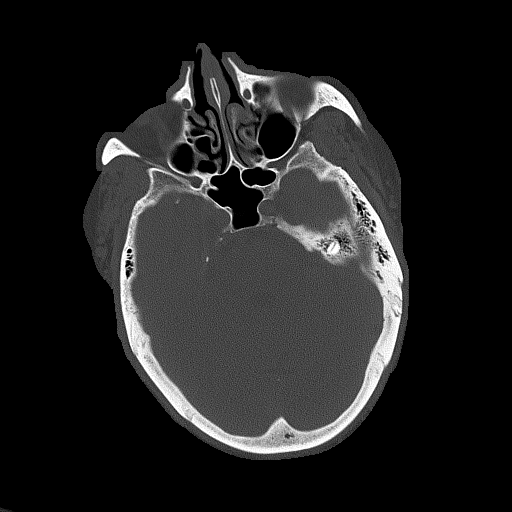

[Series 4: head without cor · coronal · non-contrast · 0.36mm/px · 3 of 77 slices shown]
[im 26/77  brain]
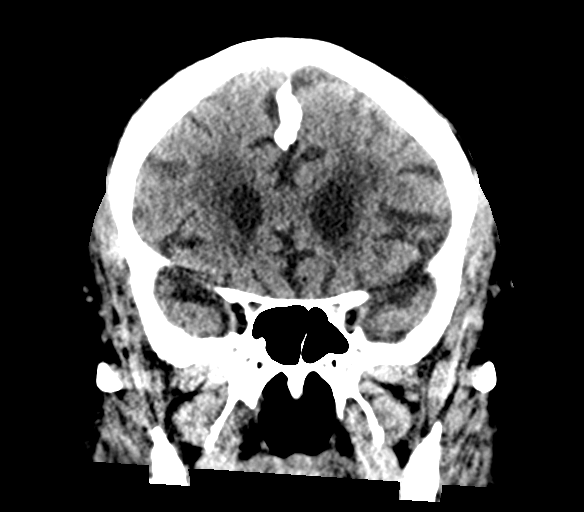
[im 34/77  brain]
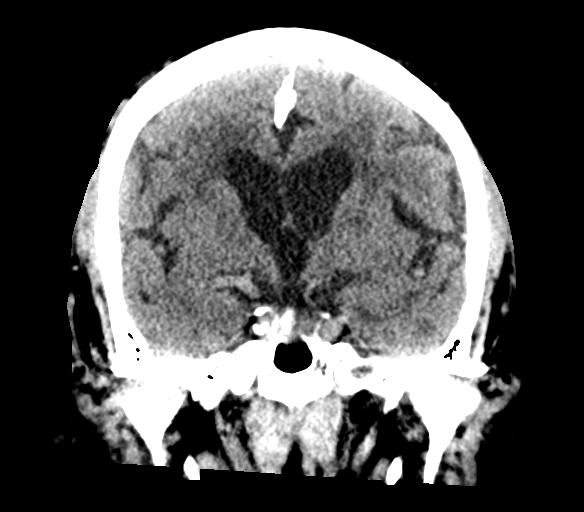
[im 43/77  brain]
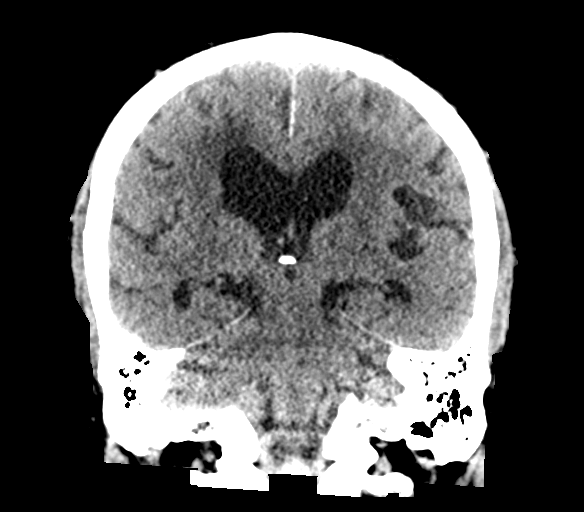

[Series 5: head without sag · sagittal · non-contrast · 0.39mm/px · 3 of 69 slices shown]
[im 23/69  brain]
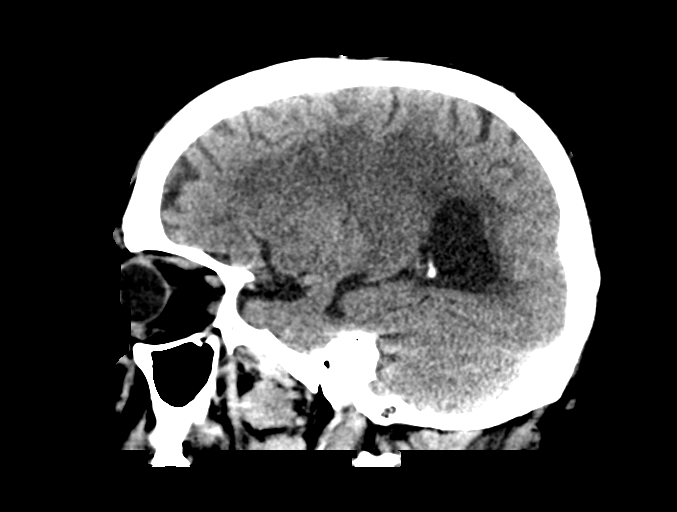
[im 35/69  brain]
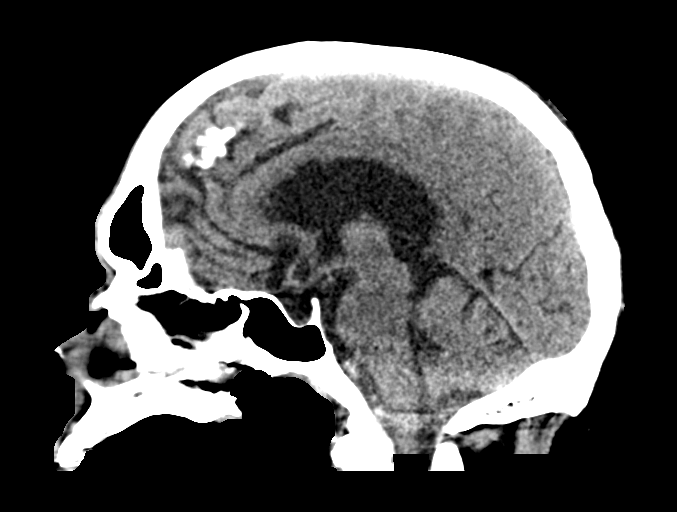
[im 46/69  brain]
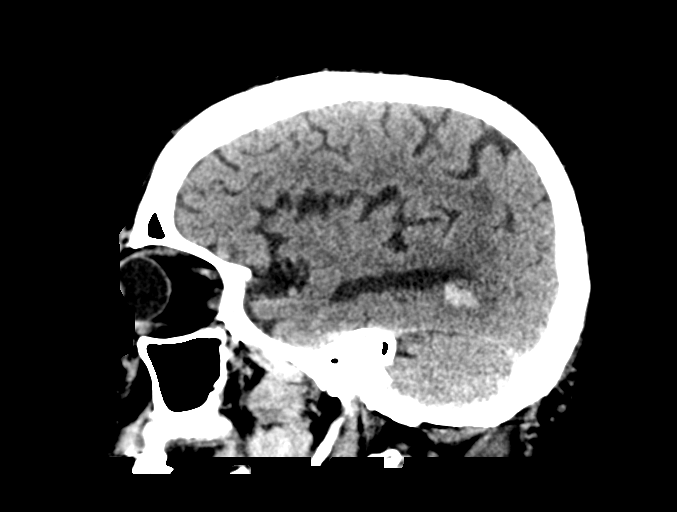

[16 of 47 positions shown; findings below may reference images not displayed]

FINDINGS: Brain: Small round hyperdense hemorrhage caudal to the atrium of the
left lateral ventricle is stable in size since presentation, roughly
14 mm diameter (coronal image 53). No significant edema or mass
effect. No convincing IVH.

Elsewhere Stable gray-white matter differentiation throughout the
brain. Stable ventricle size and configuration. No cortically based
acute infarct identified. No new intracranial hemorrhage.

Vascular: Calcified atherosclerosis at the skull base.

Skull: No acute osseous abnormality identified.

Sinuses/Orbits: Visualized paranasal sinuses and mastoids are stable
and well aerated.

Other: Stable orbit and scalp soft tissues.
IMPRESSION: 1. Stable small subependymal hemorrhage near the atrium of the left
lateral ventricle since [DATE]. No significant edema or mass
effect.
2. No new intracranial abnormality.

## 2021-10-13 IMAGING — DX DG CHEST 1V
1 series · 1 of 1 positions shown · non-contrast
Comparison: [DATE]

CLINICAL DATA: Fever of unknown origin

EXAM:
CHEST  1 VIEW

[chest]
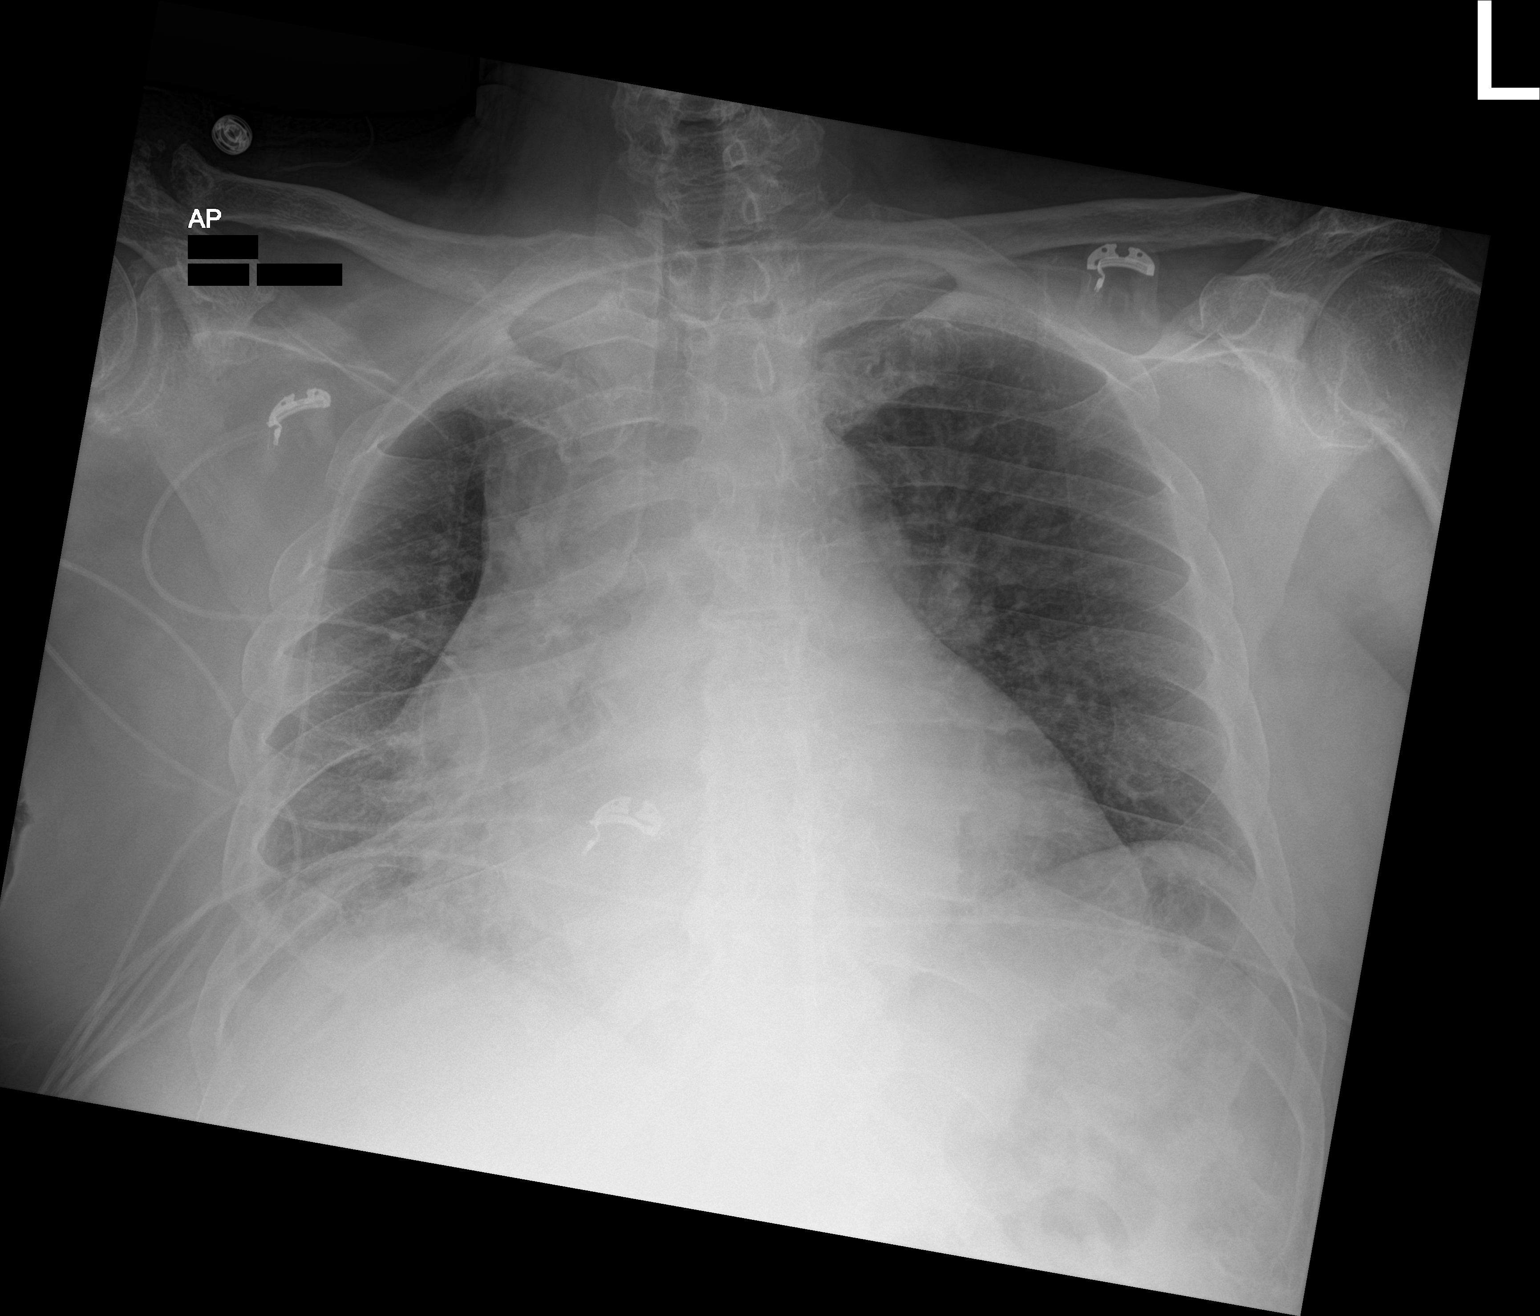

[1 of 1 positions shown; findings below may reference images not displayed]

FINDINGS: Cardiac shadow is enlarged but stable. Lungs are well aerated
bilaterally. Patchy airspace opacity in the right base is noted.
Right hilar region is somewhat prominent but accentuated by patient
rotation to the right. No bony abnormality is noted.
IMPRESSION: Right basilar atelectatic changes.

Right hilar prominence likely representing patient rotation.
Two-view chest would be helpful when clinically able.

## 2021-10-13 MED ORDER — SODIUM CHLORIDE 0.9 % IV SOLN
2.0000 g | Freq: Once | INTRAVENOUS | Status: AC
  Administered 2021-10-13: 2 g via INTRAVENOUS
  Filled 2021-10-13: qty 12.5

## 2021-10-13 MED ORDER — METRONIDAZOLE 500 MG/100ML IV SOLN
500.0000 mg | Freq: Two times a day (BID) | INTRAVENOUS | Status: DC
  Administered 2021-10-13 – 2021-10-14 (×3): 500 mg via INTRAVENOUS
  Filled 2021-10-13 (×3): qty 100

## 2021-10-13 MED ORDER — ACETAMINOPHEN-CODEINE #3 300-30 MG PO TABS
1.0000 | ORAL_TABLET | Freq: Four times a day (QID) | ORAL | Status: DC | PRN
Start: 2021-10-13 — End: 2021-10-18
  Administered 2021-10-14: 1 via ORAL
  Filled 2021-10-13: qty 1

## 2021-10-13 MED ORDER — SODIUM CHLORIDE 0.9 % IV SOLN: INTRAVENOUS | Status: DC

## 2021-10-13 MED ORDER — ASPIRIN EC 81 MG PO TBEC
81.0000 mg | DELAYED_RELEASE_TABLET | Freq: Every day | ORAL | Status: DC
  Administered 2021-10-13 – 2021-10-17 (×5): 81 mg via ORAL
  Filled 2021-10-13 (×5): qty 1

## 2021-10-13 MED ORDER — PREDNISONE 20 MG PO TABS
40.0000 mg | ORAL_TABLET | Freq: Every day | ORAL | Status: AC
Start: 2021-10-14 — End: 2021-10-18
  Administered 2021-10-14 – 2021-10-18 (×5): 40 mg via ORAL
  Filled 2021-10-13 (×5): qty 2

## 2021-10-13 MED ORDER — POTASSIUM CHLORIDE CRYS ER 10 MEQ PO TBCR
10.0000 meq | EXTENDED_RELEASE_TABLET | Freq: Every morning | ORAL | Status: DC
  Administered 2021-10-13 – 2021-10-18 (×6): 10 meq via ORAL
  Filled 2021-10-13 (×6): qty 1

## 2021-10-13 MED ORDER — SODIUM CHLORIDE 0.9 % IV SOLN
2.0000 g | Freq: Three times a day (TID) | INTRAVENOUS | Status: DC
  Administered 2021-10-13 – 2021-10-15 (×6): 2 g via INTRAVENOUS
  Filled 2021-10-13 (×6): qty 12.5

## 2021-10-13 MED ORDER — VANCOMYCIN HCL 2000 MG/400ML IV SOLN
2000.0000 mg | Freq: Once | INTRAVENOUS | Status: AC
  Administered 2021-10-13: 2000 mg via INTRAVENOUS
  Filled 2021-10-13: qty 400

## 2021-10-13 MED ORDER — LABETALOL HCL 5 MG/ML IV SOLN: 10.0000 mg | INTRAVENOUS | Status: DC | PRN

## 2021-10-13 MED ORDER — VANCOMYCIN HCL 1750 MG/350ML IV SOLN
1750.0000 mg | INTRAVENOUS | Status: DC
  Administered 2021-10-14: 1750 mg via INTRAVENOUS
  Filled 2021-10-13: qty 350

## 2021-10-13 MED ORDER — METOPROLOL TARTRATE 5 MG/5ML IV SOLN
5.0000 mg | Freq: Once | INTRAVENOUS | Status: AC
  Administered 2021-10-13: 5 mg via INTRAVENOUS
  Filled 2021-10-13: qty 5

## 2021-10-13 MED ORDER — DILTIAZEM HCL 30 MG PO TABS: 120.0000 mg | ORAL_TABLET | Freq: Every day | ORAL | Status: DC

## 2021-10-13 MED ORDER — ACETAMINOPHEN 500 MG PO TABS
500.0000 mg | ORAL_TABLET | Freq: Three times a day (TID) | ORAL | Status: DC
  Administered 2021-10-13 – 2021-10-18 (×15): 500 mg via ORAL
  Filled 2021-10-13 (×15): qty 1

## 2021-10-13 MED ORDER — METHYLPREDNISOLONE SODIUM SUCC 125 MG IJ SOLR
60.0000 mg | Freq: Once | INTRAMUSCULAR | Status: AC
  Administered 2021-10-13: 60 mg via INTRAVENOUS
  Filled 2021-10-13: qty 2

## 2021-10-13 MED ORDER — COLCHICINE 0.6 MG PO TABS
0.6000 mg | ORAL_TABLET | Freq: Two times a day (BID) | ORAL | Status: DC
  Administered 2021-10-13 – 2021-10-18 (×11): 0.6 mg via ORAL
  Filled 2021-10-13 (×12): qty 1

## 2021-10-13 MED ORDER — ACETAMINOPHEN 325 MG PO TABS
650.0000 mg | ORAL_TABLET | Freq: Once | ORAL | Status: DC
Start: 2021-10-13 — End: 2021-10-14

## 2021-10-13 NOTE — Assessment & Plan Note (Addendum)
-  This has continued despite fever control. ?- She was therefore given 5 mg of IV Lopressor on 10/13/2021. ?-Heart rate improved. ?-Norvasc discontinued and patient placed on Cardizem 30 mg p.o. twice daily.   ?-Patient did not need a Cardizem drip. ?-Patient noted to have some bouts of bradycardia while sleeping and sinus pause of 2.6 seconds however remained asymptomatic. ?-Heart rate improved with exertion and ambulation. ?-Due to ICH, Coumadin discontinued. ?-Patient placed on aspirin 81 mg daily  ?-Patient on aspirin 81 mg daily.   ?-Patient with discharge back home on home regimen of Cardizem CD1 20 mg daily on discharge.   ?-Outpatient follow-up with primary cardiologist.   ? ?

## 2021-10-13 NOTE — Progress Notes (Signed)
Speech Language Pathology Treatment: Cognitive-Linquistic  ?Patient Details ?Name: Steven Barnett ?MRN: [id number] ?DOB: 09-Mar-1951 ?Today's Date: 10/13/2021 ?Time: 7014-1030 ?SLP Time Calculation (min) (ACUTE ONLY): 28 min ? ?Assessment / Plan / Recommendation ?Clinical Impression ? Pt seen for cognitive treatment with inattention to tasks d/t pain in lower extremities and overall cognitive impairment.  Pt continues to exhibit decreased awareness of deficits; oriented to self/place and partially for date (month/year correct), but not situation.  Problem solving continues to be decreased for simple tasks (DT:HYHOOILN needs, calling for assistance), but pt repeatedly stated "I can't walk, I have other stuff on my mind", but later apologized for refusing certain tasks.  Redirection assisted min and pt was able to answer questions/stay on topic for min amounts of time during session as session progressed.  Daughter was in attendance near the end of the session.  She confirmed that this was not his baseline functioning prior to hospitalization and voiced need for rehabilitation prior to returning home.  Recommend ST f/u for cognitive tx focusing on attention, memory and problem solving/executive function needs. ?  ?HPI HPI: 71 y.o. male with medical history significant for Afib on coumadin, colon cancer, Diastolic CHF, HTN, OSA, came in to [facility] for evaluation of AMS.  Last known well presumably 12 PM when he felt acutely weak.  Family took him to lunch but he did not seem to be as interactive as his normal self.  He then became incontinent of urine and had some shaking of his left arm.  Patient unable to provide reliable history.  Brought into [facility] [facility] where he was seen by telemedicine neurology.  Hypertensive with blood pressures in the 160s.  CT head with acute small intraparenchymal hematoma within the left temporal occipital junction just inferior to the atrium and occipital horn of the left lateral  ventricle.  No frank IVH.  CTA head and neck negative for underlying vascular malformation.  While did not a neurologist was examining the patient he had a witnessed complex partial seizure involving the right hemibody and rightward gaze deviation. He was given benzos and loaded with Keppra.  Coumadin was reversed  Transferred to [facility] for further management.    The patient continued to have tachycardia and developed fever last night.  I was asked to consult with the patient for medical management.  He denied any chest pain or palpitations.  No nausea or vomiting or abdominal pain.  No significant cough or wheezing or dyspnea.  No urinary frequency or urgency, dysuria or flank pain.  No sore throat or earache or rhinorrhea or nasal congestion.  He denies any headache or dizziness or blurred vision. ?  ?   ?SLP Plan ? Continue with current plan of care ? ?  ?  ?Recommendations for follow up therapy are one component of a multi-disciplinary discharge planning process, led by the attending physician.  Recommendations may be updated based on patient status, additional functional criteria and insurance authorization. ?  ? ?Recommendations  ?   ?   ?    ?   ? ? ? ? Follow Up Recommendations: Follow physician's recommendations for discharge plan and follow up therapies ?Assistance recommended at discharge: Frequent or constant Supervision/Assistance ?SLP Visit Diagnosis: Cognitive communication deficit (R41.841) ?Plan: Continue with current plan of care ? ? ? ? ?  ?  ? ? ?Elvina Sidle, M.S., CCC-SLP ? ?10/13/2021, 1:49 PM ?

## 2021-10-13 NOTE — Progress Notes (Signed)
I have seen and assessed patient and I agree with consultation placed by Dr. Sidney Ace.  Hospitalist consulted for fever and tachycardia and for medical management. ?Patient pancultured with results pending. ?Continue empiric IV antibiotics pending culture results. ?Patient with complaints of diffuse pain in bilateral knees and ankles. ?Patient denies any history of gout however noted to be on colchicine prior to admission. ?Given dose of Solu-Medrol 60 mg IV x1, and placed on prednisone 40 mg daily x5 days. ?Follow. ? ?No charge. ?

## 2021-10-13 NOTE — Progress Notes (Signed)
Paged Dr Rory Percy to notify that MEWS is now red due TO HR 120's, Temp 102.7, RR 25. He returned a call with many orders including medical consult. Patient denies discomfort, SOB, or pain and appears to be resting comfortably. ?

## 2021-10-13 NOTE — Progress Notes (Addendum)
STROKE TEAM PROGRESS NOTE  ? ?INTERVAL HISTORY ?  ?Events from last night and early this morning noted ?Patient febrile to 102F overnight with AF with RVR. WBCs of 16. Bcx obtained and broad spectrum ABX started. UA and CXR unremarkable. On interview and assessment this morning, the patient appears to be in some amount of pain but is at his baseline mental status.  Medical hospitalist team consulted and appreciate their help.  He has been started on triple antibiotics ?Neuro exam is unchanged except patient complains of feeling tired and difficulty moving his legs ? ?Vitals:  ? 10/13/21 0236 10/13/21 0335 10/13/21 0759 10/13/21 1138  ?BP: 126/87 (!) 145/67 139/82 (!) 144/90  ?Pulse: (!) 125 (!) 119 (!) 108 (!) 118  ?Resp: (!) 27 (!) '26 20 20  '$ ?Temp: (!) 100.7 ?F (38.2 ?C) 99.6 ?F (37.6 ?C) 98.5 ?F (36.9 ?C) 98.8 ?F (37.1 ?C)  ?TempSrc: Oral Oral Oral Oral  ?SpO2: 96% 97% 94% 98%  ? ?CBC:  ?Recent Labs  ?Lab 10/09/21 ?1646 10/10/21 ?0434 10/13/21 ?0329 10/13/21 ?0725  ?WBC 6.4   < > 15.6* 16.7*  ?NEUTROABS 5.4  --   --  14.6*  ?HGB 15.4   < > 15.1 14.7  ?HCT 47.4   < > 45.0 45.0  ?MCV 83.3   < > 79.4* 80.4  ?PLT 117*   < > 128* 138*  ? < > = values in this interval not displayed.  ? ?Basic Metabolic Panel:  ?Recent Labs  ?Lab 10/13/21 ?0329 10/13/21 ?0725  ?NA 133* 133*  ?K 3.8 3.9  ?CL 102 102  ?CO2 21* 22  ?GLUCOSE 90 92  ?BUN 17 17  ?CREATININE 1.17 1.22  ?CALCIUM 9.1 9.0  ? ?Lipid Panel:  ?Recent Labs  ?Lab 10/10/21 ?5681  ?CHOL 125  ?TRIG 86  ?HDL 46  ?CHOLHDL 2.7  ?VLDL 17  ?Sudley 62  ? ?HgbA1c:  ?Recent Labs  ?Lab 10/10/21 ?2751  ?HGBA1C 5.0  ? ?Urine Drug Screen:  ?Recent Labs  ?Lab 10/09/21 ?1727  ?LABOPIA NONE DETECTED  ?COCAINSCRNUR NONE DETECTED  ?LABBENZ NONE DETECTED  ?AMPHETMU NONE DETECTED  ?THCU NONE DETECTED  ?LABBARB NONE DETECTED  ?  ?Alcohol Level  ?Recent Labs  ?Lab 10/09/21 ?1646  ?ETH <10  ? ? ?IMAGING past 24 hours ?DG Chest 1 View ? ?Result Date: 10/13/2021 ?CLINICAL DATA:  Fever of unknown  origin EXAM: CHEST  1 VIEW COMPARISON:  01/14/2021 FINDINGS: Cardiac shadow is enlarged but stable. Lungs are well aerated bilaterally. Patchy airspace opacity in the right base is noted. Right hilar region is somewhat prominent but accentuated by patient rotation to the right. No bony abnormality is noted. IMPRESSION: Right basilar atelectatic changes. Right hilar prominence likely representing patient rotation. Two-view chest would be helpful when clinically able. Electronically Signed   By: Inez Catalina M.D.   On: 10/13/2021 02:11  ? ?CT HEAD WO CONTRAST (5MM) ? ?Result Date: 10/13/2021 ?CLINICAL DATA:  71 year old male with atrial fibrillation on Coumadin. Code stroke presentation, with small acute subependymal hemorrhage near the atrium of the left lateral ventricle. EXAM: CT HEAD WITHOUT CONTRAST TECHNIQUE: Contiguous axial images were obtained from the base of the skull through the vertex without intravenous contrast. RADIATION DOSE REDUCTION: This exam was performed according to the departmental dose-optimization program which includes automated exposure control, adjustment of the mA and/or kV according to patient size and/or use of iterative reconstruction technique. COMPARISON:  Brain MRI 10/10/2021 and earlier. FINDINGS: Brain: Small round hyperdense hemorrhage caudal  to the atrium of the left lateral ventricle is stable in size since presentation, roughly 14 mm diameter (coronal image 53). No significant edema or mass effect. No convincing IVH. Elsewhere Stable gray-white matter differentiation throughout the brain. Stable ventricle size and configuration. No cortically based acute infarct identified. No new intracranial hemorrhage. Vascular: Calcified atherosclerosis at the skull base. Skull: No acute osseous abnormality identified. Sinuses/Orbits: Visualized paranasal sinuses and mastoids are stable and well aerated. Other: Stable orbit and scalp soft tissues. IMPRESSION: 1. Stable small subependymal  hemorrhage near the atrium of the left lateral ventricle since 10/09/2021. No significant edema or mass effect. 2. No new intracranial abnormality. Electronically Signed   By: Genevie Ann M.D.   On: 10/13/2021 06:09  ? ?DG Chest Port 1 View ? ?Result Date: 10/13/2021 ?CLINICAL DATA:  Fever. EXAM: PORTABLE CHEST 1 VIEW COMPARISON:  Chest radiograph 10/13/2021 at 2:00 a.m. FINDINGS: The patient remains rotated to the right with grossly unchanged cardiomediastinal silhouette and cardiomegaly. Right basilar opacity is stable to mildly improved. There is chronic blunting of the right lateral costophrenic angle. No large pleural effusion or pneumothorax is identified. No acute osseous abnormality is seen. IMPRESSION: Stable to mildly improved right basilar opacity, likely atelectasis. Electronically Signed   By: Logan Bores M.D.   On: 10/13/2021 07:59   ? ?PHYSICAL EXAM ? ?Physical Exam  ?Constitutional: Appears well-developed and well-nourished.  Middle-aged African-American male ?Psych: Affect appropriate to situation ?Cardiovascular: irregular rate and rhythm.  ?Respiratory: Effort normal, non-labored breathing ? ?Neuro: ?Mental Status: ?As above ?Cranial Nerves: ?II: Visual Fields are full. Pupils are round, and reactive to light. R pupil 0.5 mm larger than L.  ?III,IV, VI: EOMI without ptosis or diploplia.  ?V: Facial sensation is symmetric to temperature ?VII: Facial movement is symmetric resting and smiling ?VIII: Hearing is intact to voice ?X: Palate elevates symmetrically ?XI: Shoulder shrug is symmetric. ?XII: Tongue protrudes midline without atrophy or fasciculations.  ?Motor: ?Tone is normal. Bulk is normal. Strength difficult to assess 2/2 pain in lower extremities, but appears to be unchanged from previous days (5/5). ?Sensory: ?Sensation is symmetric to light touch and temperature in the arms and legs. ?Cerebellar: ?FNF intact ? ? ? ?ASSESSMENT/PLAN ?Steven Barnett is a 71 y.o. male PMH of Afib on coumadin,  colon cancer, Diastolic CHF, HTN, OSA, came in to [facility] for evaluation of AMS. CT head with acute small intraparenchymal hematoma within the left temporal occipital junction just inferior to the atrium and occipital horn of the left lateral ventricle. CTA head and neck negative for underlying vascular malformation. Coumadin reversed. Had a witnessed GTC event with R gaze deviation for which he was Keppra loaded and remains on 500 mg BID.   ? 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   ?Diet  ? Diet Heart Room service appropriate? Yes with Assist; Fluid consistency: Thin  ? ?aspirin 81 mg daily and warfarin 4 mg daily prior to admission, now on ASA 81 mg (started 4/12 after CTH showing stable area of hemorrhage) ?Therapy recommendations:  PT rec SNF  ?Disposition:  SNF ? ?Hypertension ?Home meds: NA ?  Unstable ?Strict BP goal with PRNs as below ?Long-term BP goal normotensive ? ?Hyperlipidemia ?Home meds: NA ?LDL 62, goal < 70 ?No statin indicated ?Continue statin at discharge ? ?Glucose control ?Home meds:  NA ?HgbA1c 5.0, goal < 7.0 ?CBGs ?Recent Labs  ?  10/13/21 ?0623 10/13/21 ?0922 10/13/21 ?1141  ?GLUCAP 122* 89 108*  ?  ?SSI ? ?Other Stroke Risk Factors ?Advanced Age >/= 95  ?Cigarette smoker, advised to stop smoking ?ETOH use, alcohol level <10, advised to drink no more than 1 drink(s) a day ?Obesity, BMI >/= 30 associated with increased stroke risk, recommend weight loss, diet and exercise as appropriate  ?Obstructive sleep apnea, on CPAP at home ?Congestive heart failure ? ?Other Active  Problems: ? ?Subcortical ICH, nontraumatic ?Treatment: ?-Admit to nicu ?-ICH Score: 0 ?-ICH Volume: 1cc  ?-BP control goal SYS 130-160 ?-PT/OT/ST  ?-Close neuro monitoring ?  ?CNS ?Seizure-focal with likely secon

## 2021-10-13 NOTE — Progress Notes (Addendum)
OVERNIGHT ON CALL PROGRESS NOTE ? ? ?HR sustained 120-130s ?Temp over 102F ?Blood and urine c/s ?CXR ?Will consult medicine formally ? ?ADDENDUM ?Spoke with Dr. Sidney Ace from hospitalist service who will see the patient in consultation. Appreciate his assistance. ? ?-- ?Amie Portland, MD ?Neurologist ?[facility] ?Pager: [phone] ? ?

## 2021-10-13 NOTE — H&P (Signed)
?  ?  ?[place] ? ?Consult note ? ?PATIENT NAME: Steven Barnett   ? ?MR#:  [id number] ? ?DATE OF BIRTH:  09-14-1950 ? ?DATE OF ADMISSION:  10/09/2021 ? ?PRIMARY CARE PHYSICIAN: [place], [place], [place]  ? ?Patient is coming from: Home ? ?REQUESTING/REFERRING PHYSICIAN: Amie Portland, MD  ? ?CHIEF COMPLAINT:  ? ?Chief Complaint  ?Patient presents with  ? Altered Mental Status  ? ? ?HISTORY OF PRESENT ILLNESS:  ?Steven Barnett is a 71 y.o. male with medical history significant for Afib on coumadin, colon cancer, Diastolic CHF, HTN, OSA, came in to [facility] for evaluation of AMS. ?Last known well presumably 12 PM when he felt acutely weak.  Family took him to lunch but he did not seem to be as interactive as his normal self.  He then became incontinent of urine and had some shaking of his left arm.  Patient unable to provide reliable history.  Brought into [facility] where he was seen by telemedicine neurology.  Hypertensive with blood pressures in the 160s.  CT head with acute small intraparenchymal hematoma within the left temporal occipital junction just inferior to the atrium and occipital horn of the left lateral ventricle.  No frank IVH.  CTA head and neck negative for underlying vascular malformation.  While did not a neurologist was examining the patient he had a witnessed complex partial seizure involving the right hemibody and rightward gaze deviation. He was given benzos and loaded with Keppra.  Coumadin was reversed ?Transferred to [facility] for further management. ? ?The patient continued to have tachycardia and developed fever last night.  I was asked to consult with the patient for medical management.  He denied any chest pain or palpitations.  No nausea or vomiting or abdominal pain.  No significant cough or wheezing or dyspnea.  No urinary frequency or urgency, dysuria or flank pain.  No sore throat or earache or rhinorrhea or nasal congestion.  He denies any headache or dizziness or  blurred vision. ? ?His temperature was 102.7 with BP of 144/90 and respiratory rate of 25 with heart rate 128 and pulse currently was 97% on room air.  Labs revealed a sodium of 133 with a CO2 of 21 with otherwise unremarkable BMP.  CBC showed leukocytosis of 15.6 with platelets of 128 and mild microcytosis. ?EKG as reviewed by me : His EKG showed atrial fibrillation with rapid ventricular response of 120 with Q waves anteroseptally and T wave inversion laterally. ?Imaging: Portable chest x-ray showed right basilar atelectatic changes and right hilar prominence likely presenting patient rotation. ? ? ?PAST MEDICAL HISTORY:  ? ?Past Medical History:  ?Diagnosis Date  ? Anemia   ? Arthralgia of both knees   ? Arthritis   ? "right knee" (10/01/2015)  ? Atrial fibrillation ([place])   ? Cancer [facility])   ? STAGE 1 COLON CANCER  ? Chronic anticoagulation 2010  ? Coumadin  ? Chronic diastolic CHF (congestive heart failure), NYHA class 2 ([place])   ? Dyspnea   ? Dysrhythmia   ? Hypertension   ? OSA (obstructive sleep apnea) 2016  ? "couldn't take the mask during the testing" (10/01/2015)  ? Permanent atrial fibrillation ([place]) 2010  ? Pneumonia 3/29/2017and june 2017  ? ? ?PAST SURGICAL HISTORY:  ? ?Past Surgical History:  ?Procedure Laterality Date  ? COLONOSCOPY WITH PROPOFOL N/A 03/04/2016  ? Procedure: COLONOSCOPY WITH PROPOFOL;  Surgeon: Carol Ada, MD;  Location: [facility];  Service: Endoscopy;  Laterality: N/A;  ? COLONOSCOPY WITH PROPOFOL N/A 09/15/2017  ? Procedure: COLONOSCOPY WITH PROPOFOL;  Surgeon: Carol Ada, MD;  Location: [facility];  Service: Endoscopy;  Laterality: N/A;  ? COLONOSCOPY WITH PROPOFOL N/A 02/12/2021  ? Procedure: COLONOSCOPY WITH PROPOFOL;  Surgeon: Carol Ada, MD;  Location: [facility];  Service: Endoscopy;  Laterality: N/A;  ? LAPAROSCOPIC PARTIAL COLECTOMY N/A 04/12/2016  ? Procedure: LAPAROSCOPIC ILEOCOLECTOMY;  Surgeon: [name], MD;  Location: [place];  Service: General;  Laterality:  N/A;  ? PORT-A-CATH REMOVAL N/A 02/16/2017  ? Procedure: REMOVAL PORT-A-CATH;  Surgeon: [name], MD;  Location: [place];  Service: General;  Laterality: N/A;  ? PORTACATH PLACEMENT N/A 05/18/2016  ? Procedure: INSERTION PORT-A-CATH;  Surgeon: [name], MD;  Location: [place];  Service: General;  Laterality: N/A;  ? THORACOTOMY Right 2010  ? ? ?SOCIAL HISTORY:  ? ?Social History  ? ?Tobacco Use  ? Smoking status: Former  ?  Packs/day: 0.10  ?  Years: 2.00  ?  Pack years: 0.20  ?  Types: Cigarettes  ?  Quit date: 12/16/1974  ?  Years since quitting: 46.8  ? Smokeless tobacco: Never  ? Tobacco comments:  ?  "1 pack cigarettes would last me a month"  ?Substance Use Topics  ? Alcohol use: No  ? ? ?FAMILY HISTORY:  ? ?Family History  ?Problem Relation Age of Onset  ? Heart disease Father   ? Heart failure Father   ? Colon polyps Father   ?     unspecified number - pt of intestine surgically resected  ? Hypertension Mother   ? Diabetes Mother   ? Hyperlipidemia Mother   ? Colon polyps Mother   ?     unspecified number - pt of intestine surgically resected  ? Dementia Mother   ?     d. 25  ? Stroke Maternal Grandmother   ? Stroke Sister   ? Colon cancer Sister 21  ?     w/ "cancerous polyps" - unspecified number; underwent surgery and radiation  ? Colon polyps Brother   ?     unspecified number - polypectomies  ? Colon cancer Sister   ?     dx 41-60; s/p surgery and radiation  ? Bone cancer Maternal Uncle   ?     dx. 62s  ? Lung cancer Paternal Grandfather   ?     d. 80s-90s; lung cancer vs TB  ? Colon cancer Sister   ?     dx. 11-60; s/p surgery and radiation  ? Cancer Maternal Uncle   ?     mother had about 7-8 other siblings, most passed at older ages, many of whom had some type of cancer  ? Heart attack Neg Hx   ? ? ?DRUG ALLERGIES:  ?No Known Allergies ? ?REVIEW OF SYSTEMS:  ? ?ROS ?As per history of present illness. All pertinent systems were reviewed above. Constitutional, HEENT,  cardiovascular, respiratory, GI, GU, musculoskeletal, neuro, psychiatric, endocrine, integumentary and hematologic systems were reviewed and are otherwise negative/unremarkable except for positive findings mentioned above in the HPI. ? ? ?MEDICATIONS AT HOME:  ? ?Prior to Admission medications   ?Medication Sig Start Date End Date Taking? Authorizing Provider  ?acetaminophen (TYLENOL) 500 MG tablet Take 1,000 mg by mouth 2 (two) times daily.   Yes [provider]  ?aspirin EC 81 MG tablet Take 81 mg by mouth every morning.   Yes [provider]  ?Colchicine  0.6 MG CAPS Take 0.6 mg by mouth 2 (two) times daily.   Yes [provider]  ?furosemide (LASIX) 40 MG tablet TAKE 1 TABLET EVERY DAY ?Patient taking differently: Take 40 mg by mouth every morning. 06/23/21  Yes Loel Dubonnet, NP  ?potassium chloride (KLOR-CON) 10 MEQ tablet Take 1 tablet (10 mEq total) by mouth daily. ?Patient taking differently: Take 10 mEq by mouth every morning. 09/23/21  Yes Loel Dubonnet, NP  ?predniSONE (STERAPRED UNI-PAK 21 TAB) 10 MG (21) TBPK tablet Take by mouth daily. Take 6 tabs by mouth daily  for 2 days, then 5 tabs for 2 days, then 4 tabs for 2 days, then 3 tabs for 2 days, 2 tabs for 2 days, then 1 tab by mouth daily for 2 days ?Patient taking differently: Take 10 mg by mouth daily as needed (gout attacks). 04/14/21  Yes Isla Pence, MD  ?TURMERIC PO Take 1,000 mg by mouth every morning.   Yes [provider]  ?warfarin (COUMADIN) 4 MG tablet TAKE 1 TO 2 TABLETS AS DIRECTED BY COUMADIN CLINIC ?Patient taking differently: Take 4 mg by mouth every morning. Or as directed by coumadin clinic 09/08/21  Yes Hilty, [name], MD  ?albuterol (PROVENTIL HFA;VENTOLIN HFA) 108 (90 Base) MCG/ACT inhaler Inhale 2 puffs into the lungs every 6 (six) hours as needed for wheezing or shortness of breath. ?Patient not taking: Reported on 10/10/2021 10/01/15   Nita Sells, MD  ?diltiazem (CARDIZEM)  120 MG tablet TAKE 1 TABLET BY MOUTH ONCE DAILY ?Patient not taking: Reported on 10/10/2021 12/13/17   Pixie Casino, MD  ?HYDROcodone-acetaminophen (NORCO/VICODIN) 5-325 MG tablet Take 1 tablet by mouth every 4 (fo

## 2021-10-13 NOTE — Care Management Important Message (Signed)
Important Message ? ?Patient Details  ?Name: Steven Barnett ?MRN: [id number] ?Date of Birth: Sep 05, 1950 ? ? ?Medicare Important Message Given:  Yes ? ? ? ? ?Javis Abboud ?10/13/2021, 3:07 PM ?

## 2021-10-13 NOTE — Sepsis Progress Note (Signed)
ELink tracking the Code Sepsis. 

## 2021-10-13 NOTE — Plan of Care (Signed)
?  Problem: Education: ?Goal: Knowledge of disease or condition will improve ?Outcome: Progressing ?  ?Problem: Clinical Measurements: ?Goal: Ability to maintain clinical measurements within normal limits will improve ?Outcome: Progressing ?Goal: Respiratory complications will improve ?Outcome: Progressing ?  ?Problem: Pain Managment: ?Goal: General experience of comfort will improve ?Outcome: Progressing ?  ?Problem: Safety: ?Goal: Ability to remain free from injury will improve ?Outcome: Progressing ?  ?Problem: Skin Integrity: ?Goal: Risk for impaired skin integrity will decrease ?Outcome: Progressing ?  ?

## 2021-10-13 NOTE — Assessment & Plan Note (Addendum)
-   This may be hypovolemic. ?-Improved with hydration. ?

## 2021-10-13 NOTE — Progress Notes (Signed)
Physical Therapy Treatment ?Patient Details ?Name: Steven Barnett ?MRN: [id number] ?DOB: October 16, 1950 ?Today's Date: 10/13/2021 ? ? ?History of Present Illness 71 y.o. male presents to [facility] hospital as a transfer from [place] on 10/09/2021 with AMS, incontinence and weakness. 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 Pt experienced a seizure when at [place]. PMH includes Afib on coumadin, colon cancer, Diastolic CHF, HTN, OSA.  Pt. with significant B LE pain secondary to gout and code sepsis 4/12. ? ?  ?PT Comments  ? ? Focus of session today was functional transfers and bed mobility. The patient present with significant LE pain that limited the session. He was Max A +2 for all bed mobility and transfers. He declines to stand after multiple attempts secondary to LE pain. He now presents with gout flare and sepsis, requiring further medical workup.  Pain, B LE strength, balance, and decreased functional ability are still limiting function. Pt. Would benefit from skilled PT to continue to address his mobility, balance, strength, and gait. Frequency to be lowered until pain is better managed and discharge setting remains unchanged. Pt to follow acutely as appropriate.  ?   ?Recommendations for follow up therapy are one component of a multi-disciplinary discharge planning process, led by the attending physician.  Recommendations may be updated based on patient status, additional functional criteria and insurance authorization. ? ?Follow Up Recommendations ? Skilled nursing-short term rehab (<3 hours/day) ?  ?  ?Assistance Recommended at Discharge Frequent or constant Supervision/Assistance  ?Patient can return home with the following Assistance with cooking/housework;Direct supervision/assist for medications management;Direct supervision/assist for financial management;Assist for transportation;Help with stairs or ramp for entrance;Two people to help with walking and/or transfers;A lot of help with  bathing/dressing/bathroom ?  ?Equipment Recommendations ? None recommended by PT  ?  ?Recommendations for Other Services   ? ? ?  ?Precautions / Restrictions Precautions ?Precautions: Fall ?Precaution Comments: Gout flare ?Restrictions ?Weight Bearing Restrictions: No  ?  ? ?Mobility ? Bed Mobility ?Overal bed mobility: Needs Assistance ?Bed Mobility: Supine to Sit ?  ?  ?Supine to sit: +2 for physical assistance, Max assist ?  ?  ?General bed mobility comments: Max A for LE movement, pivot, and trunk support for sit. Pt. able to initiate movement but requires assist for the rest. ?Patient Response: Anxious ? ?Transfers ?Overall transfer level: Needs assistance ?  ?Transfers: Bed to chair/wheelchair/BSC ?  ?  ?  ?Squat pivot transfers: +2 physical assistance, Max assist, From elevated surface ?  ?  ?General transfer comment: max A of 2 after multiple attempts to stand with RW, pt shouting "wait, wait." Pt requires A for boost and pivot and would push back down to bed. ?  ? ?Ambulation/Gait ?  ?  ?  ?  ?  ?  ?  ?  ? ? ?Stairs ?  ?  ?  ?  ?  ? ? ?Wheelchair Mobility ?  ? ?Modified Rankin (Stroke Patients Only) ?Modified Rankin (Stroke Patients Only) ?Pre-Morbid Rankin Score: No significant disability ?Modified Rankin: Severe disability ? ? ?  ?Balance Overall balance assessment: Needs assistance ?Sitting-balance support: Feet supported ?Sitting balance-Leahy Scale: Fair ?Sitting balance - Comments: Pt. able to move EOB and maintain sitting balance ?  ?  ?Standing balance-Leahy Scale: Zero ?Standing balance comment: Pt. declines stand ?  ?  ?  ?  ?  ?  ?  ?  ?  ?  ?  ?  ? ?  ?Cognition Arousal/Alertness: Awake/alert ?  Behavior During Therapy: [facility] for tasks assessed/performed ?Overall Cognitive Status: Impaired/Different from baseline ?Area of Impairment: Safety/judgement, Orientation, Memory, Following commands ?  ?  ?  ?  ?  ?  ?  ?  ?Orientation Level: Situation ?  ?Memory: Decreased short-term memory ?Following  Commands: Follows one step commands inconsistently, Follows multi-step commands consistently ?Safety/Judgement: Decreased awareness of deficits ?Awareness: Emergent ?Problem Solving: Slow processing, Decreased initiation ?General Comments: Pt. with decreased initiation and severly self limiting today, likely secondary to significant LE pain. ?  ?  ? ?  ?Exercises   ? ?  ?General Comments General comments (skin integrity, edema, etc.): Pt. with significant B LE pain, self limiting, and anxious throughout session. ?  ?  ? ?Pertinent Vitals/Pain Pain Assessment ?Pain Assessment: 0-10 ?Pain Score: 8  ?Pain Location: B LE ?Pain Descriptors / Indicators: Burning, Aching, Sharp ?Pain Intervention(s): Limited activity within patient's tolerance, Monitored during session, Repositioned  ? ? ?Home Living   ?  ?  ?  ?  ?  ?  ?  ?  ?  ?   ?  ?Prior Function    ?  ?  ?   ? ?PT Goals (current goals can now be found in the care plan section) Acute Rehab PT Goals ?Patient Stated Goal: to return to independence ?PT Goal Formulation: With patient ?Time For Goal Achievement: 10/24/21 ?Potential to Achieve Goals: Fair ?Progress towards PT goals: Not progressing toward goals - comment (Secondary to pain) ? ?  ?Frequency ? ? ? Min 3X/week ? ? ? ?  ?PT Plan Current plan remains appropriate;Frequency needs to be updated  ? ? ?Co-evaluation   ?  ?  ?  ?  ? ?  ?AM-PAC PT "6 Clicks" Mobility   ?Outcome Measure ? Help needed turning from your back to your side while in a flat bed without using bedrails?: A Lot ?Help needed moving from lying on your back to sitting on the side of a flat bed without using bedrails?: A Lot ?Help needed moving to and from a bed to a chair (including a wheelchair)?: Total ?Help needed standing up from a chair using your arms (e.g., wheelchair or bedside chair)?: Total ?Help needed to walk in hospital room?: Total ?Help needed climbing 3-5 steps with a railing? : Total ?6 Click Score: 8 ? ?  ?End of Session Equipment  Utilized During Treatment: Gait belt ?Activity Tolerance: Patient limited by pain ?Patient left: in chair;with call bell/phone within reach;with chair alarm set ?Nurse Communication: Mobility status ?PT Visit Diagnosis: Other abnormalities of gait and mobility (R26.89);Muscle weakness (generalized) (M62.81);Other symptoms and signs involving the nervous system (R29.898);Pain ?Pain - Right/Left:  (B) ?Pain - part of body: Knee ?  ? ? ?Time: 2979-8921 ?PT Time Calculation (min) (ACUTE ONLY): 28 min ? ?Charges:  $Therapeutic Activity: 23-37 mins          ?          ? ?Thermon Leyland, SPT ?Acute Rehab Services ? ? ? ?Thermon Leyland ?10/13/2021, 10:07 AM ? ?

## 2021-10-13 NOTE — Assessment & Plan Note (Addendum)
-   Patient noted with history of gout as noted to be on colchicine prior to admission  ?-Patient noted on 10/12/2021 to spike fevers, be tachycardic.  -On assessment patient with complaints of diffuse bilateral knee bilateral ankle and left great toe pain to the point he was having difficulty ambulating.   ?-Patient given IV Solu-Medrol x1 on 10/13/2021, placed on oral prednisone x5 days with clinical improvement.   ?-Patient completed a full course of prednisone and placed on allopurinol.  ?-Outpatient follow-up with PCP. ?

## 2021-10-13 NOTE — Assessment & Plan Note (Addendum)
SIRS ruled out ?- The patient had a fever of unknown etiology.??  Likely secondary to gout flare. ?- Patient pancultured with no growth to date in terms of blood cultures.  Differential diagnoses include atelectasis. ?- Urine cultures with 20,000 colonies of E. coli.  ?-Patient was placed empirically on IV vancomycin, IV Flagyl, IV cefepime.  ?-Leukocytosis trended down. ?-Patient afebrile.  ?- Patient pancultured and placed empirically on IV vancomycin, IV Flagyl, IV ?- Given abnormal portable chest x-ray 2 view was ordered but not done.   ?-Patient placed on incentive spirometry, flutter valve.   ?-MRSA PCR negative.   ?-Patient also placed on steroids due to concern for acute gouty flare.   ?-Patient remained afebrile for the rest of the hospitalization.  -IV antibiotics discontinued patient placed on Levaquin to complete a 5-day course of antibiotic treatment.  ?

## 2021-10-13 NOTE — Progress Notes (Signed)
Pharmacy Antibiotic Note ? ?Steven Barnett is a 71 y.o. male admitted on 10/09/2021 with AMS/seizures found to have Aquilla.  Now with tachycardia and fever, possible sepsis.  Pharmacy has been consulted for Vancomycin and Cefepime  dosing. ? ?Plan: ?Vancomycin 2000 mg IV now, then Vancomycin 1750 mg IV q24h ?Cefepime 2 g IV q8h ? ?  ? ?Temp (24hrs), Avg:99.8 ?F (37.7 ?C), Min:97.8 ?F (36.6 ?C), Max:102.7 ?F (39.3 ?C) ? ?Recent Labs  ?Lab 10/09/21 ?1646 10/10/21 ?0434 10/11/21 ?0228 10/13/21 ?0329  ?WBC 6.4 7.8 7.6 15.6*  ?CREATININE 1.04 0.85 0.92 1.17  ?  ?CrCl cannot be calculated (Unknown ideal weight.).   ? ?No Known Allergies ? ? ?Arria Naim, Bronson Curb ?10/13/2021 7:00 AM ? ?

## 2021-10-13 NOTE — Progress Notes (Signed)
Occupational Therapy Treatment ?Patient Details ?Name: Steven Barnett ?MRN: [id number] ?DOB: July 12, 1950 ?Today's Date: 10/13/2021 ? ? ?History of present illness 71 y.o. male presents to [facility] hospital as a transfer from [place] on 10/09/2021 with AMS, incontinence and weakness. 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 Pt experienced a seizure when at [place]. PMH includes Afib on coumadin, colon cancer, Diastolic CHF, HTN, OSA.  Pt. with significant B LE pain secondary to gout and code sepsis 4/12. ?  ?OT comments ? Steven Barnett is not making functional progress towards his goals and is requiring significantly more assist therefore d/c recommendation has been updated to SNF for appropriate level therapies and safety. Pt was in significant pain throughout, which worsened with all mobility attempts. He was agreeable to chair level exercise which he tolerated well. Pt noted to have increased confusion this date needing more cues for orientation, problem solving and sequencing. OT to continue to follow acutely.  ? ?Recommendations for follow up therapy are one component of a multi-disciplinary discharge planning process, led by the attending physician.  Recommendations may be updated based on patient status, additional functional criteria and insurance authorization. ?   ?Follow Up Recommendations ? Skilled nursing-short term rehab (<3 hours/day)  ?  ?   ?Patient can return home with the following ? Assistance with cooking/housework;Direct supervision/assist for medications management;Direct supervision/assist for financial management;Assist for transportation;A lot of help with walking and/or transfers;A lot of help with bathing/dressing/bathroom ?  ?   ?   ?Precautions / Restrictions Precautions ?Precautions: Fall ?Precaution Comments: Gout flare ?Restrictions ?Weight Bearing Restrictions: No  ? ? ?  ? ?Mobility Bed Mobility ?Overal bed mobility: Needs Assistance ?  ?  ?  ?  ?  ?  ?General bed mobility comments:  pt declined - in chair upon arrival ?  ? ?Transfers ?Overall transfer level: Needs assistance ?  ?  ?  ?  ?  ?  ?  ?  ?General transfer comment: not attempted, pt declined after 3 chiar push up exercises ?  ?  ?Balance Overall balance assessment: Needs assistance ?Sitting-balance support: Feet supported ?Sitting balance-Leahy Scale: Fair ?Sitting balance - Comments: able ti sit in the chair away from the back rest ?  ?  ?  ?  ?  ?  ?  ?  ?  ?  ?  ?  ?  ?  ?  ?   ? ?ADL either performed or assessed with clinical judgement  ? ?ADL Overall ADL's : Needs assistance/impaired ?  ?  ?  ?  ?  ?  ?  ?  ?  ?  ?  ?  ?  ?  ?  ?  ?  ?  ?Functional mobility during ADLs: Maximal assistance ?General ADL Comments: limited this session despite max education - completed exercises from the chair and pt declined further participation ?  ? ?Extremity/Trunk Assessment Upper Extremity Assessment ?Upper Extremity Assessment: RUE deficits/detail;LUE deficits/detail ?RUE Deficits / Details: limited ROM in hands - pt states this is baseline. RUE is overall WFL ?LUE Deficits / Details: limited ROM in hands - pt states this is baseline. LUE is overal WFL ?  ?Lower Extremity Assessment ?Lower Extremity Assessment: Defer to PT evaluation ?  ?  ?  ? ?Vision   ?Vision Assessment?: No apparent visual deficits ?  ?Perception Perception ?Perception: Within Functional Limits ?  ?Praxis Praxis ?Praxis: Intact ?  ? ?Cognition Arousal/Alertness: Awake/alert ?Behavior During Therapy: [facility] for tasks  assessed/performed ?Overall Cognitive Status: Impaired/Different from baseline ?Area of Impairment: Safety/judgement, Orientation, Memory, Following commands ?  ?  ?  ?  ?  ?  ?  ?  ?Orientation Level: Situation ?  ?Memory: Decreased short-term memory ?Following Commands: Follows one step commands inconsistently, Follows multi-step commands consistently ?Safety/Judgement: Decreased awareness of deficits ?Awareness: Emergent ?Problem Solving: Slow processing,  Decreased initiation ?General Comments: Pt. with decreased initiation and severly self limiting today, likely secondary to significant LE pain. Noted to repeat himself several times throughout the session and not fully aware of the situation despite education ?  ?  ?   ?Exercises Exercises: Other exercises ?Other Exercises ?Other Exercises: chair sit ups 5x wtih use of bilat UEs ?Other Exercises: chair push ups 3x with minimal hip clearence ?Other Exercises: ` ? ?  ?   ?General Comments pt with significant pain and increased confusion this date  ? ? ?Pertinent Vitals/ Pain       Pain Assessment ?Pain Assessment: Faces ?Faces Pain Scale: Hurts whole lot ?Pain Location: "everywhere" ?Pain Descriptors / Indicators: Aching, Grimacing, Sharp ?Pain Intervention(s): Limited activity within patient's tolerance, Monitored during session ? ? ?Frequency ? Min 2X/week  ? ? ? ? ?  ?Progress Toward Goals ? ?OT Goals(current goals can now be found in the care plan section) ? Progress towards OT goals: Not progressing toward goals - comment ? ?Acute Rehab OT Goals ?Patient Stated Goal: less pain ?OT Goal Formulation: With patient ?Time For Goal Achievement: 10/24/21 ?Potential to Achieve Goals: Good ?ADL Goals ?Pt Will Perform Grooming: Independently;standing ?Pt Will Transfer to Toilet: ambulating;with modified independence ?Pt Will Perform Toileting - Clothing Manipulation and hygiene: with modified independence;sitting/lateral leans ?Additional ADL Goal #1: Pt will indep complete a 3 step ADL tasks in a nondistracting environment  ?Plan Discharge plan needs to be updated   ? ?   ?AM-PAC OT "6 Clicks" Daily Activity     ?Outcome Measure ? ? Help from another person eating meals?: None ?Help from another person taking care of personal grooming?: A Little ?Help from another person toileting, which includes using toliet, bedpan, or urinal?: A Lot ?Help from another person bathing (including washing, rinsing, drying)?: A Lot ?Help  from another person to put on and taking off regular upper body clothing?: A Little ?Help from another person to put on and taking off regular lower body clothing?: A Lot ?6 Click Score: 16 ? ?  ?End of Session   ? ?OT Visit Diagnosis: Unsteadiness on feet (R26.81);Other abnormalities of gait and mobility (R26.89);Muscle weakness (generalized) (M62.81) ?  ?Activity Tolerance Patient limited by pain ?  ?Patient Left in chair;with call bell/phone within reach;with chair alarm set;with family/visitor present ?  ?Nurse Communication Mobility status ?  ? ?   ? ?Time: 1425-1440 ?OT Time Calculation (min): 15 min ? ?Charges: OT General Charges ?$OT Visit: 1 Visit ?OT Treatments ?$Therapeutic Activity: 8-22 mins ? ? ?\ ?Bobbette Eakes A Philippe Gang ?10/13/2021, 3:11 PM ?

## 2021-10-13 NOTE — Assessment & Plan Note (Addendum)
-   Code stroke was called on admission due to acute small ICH within the left temporal occipital junction inferior to the atrium and occipital horn of the left lateral ventricle.   ?-CT angio head and neck negative for underlying vascular malformation.   ?-MRI done with stable hematoma.   ?-2D echo with a EF of 60 to 65%, no cardiac source of embolism.   ?-EEG done was negative for any epileptiform activity or seizures.   ?-Fasting lipid panel with LDL of 62.   ?-Hemoglobin A1c 5.0.   ?-  Coumadin was discontinued given his ICH. ?-Patient started on aspirin 81 mg daily per neurology recommendations. ?-PT recommended SNF however patient wants to really go home and ambulated in hallway with walker.  Son feels patient is close to baseline. ?-Patient reassessed by PT and had improved on day of discharge. ?-Outpatient to follow-up with neurology. ?

## 2021-10-14 DIAGNOSIS — I161 Hypertensive emergency: Secondary | ICD-10-CM | POA: Diagnosis not present

## 2021-10-14 DIAGNOSIS — M109 Gout, unspecified: Secondary | ICD-10-CM | POA: Diagnosis not present

## 2021-10-14 DIAGNOSIS — I4891 Unspecified atrial fibrillation: Secondary | ICD-10-CM | POA: Diagnosis not present

## 2021-10-14 DIAGNOSIS — E871 Hypo-osmolality and hyponatremia: Secondary | ICD-10-CM | POA: Diagnosis not present

## 2021-10-14 LAB — GLUCOSE, CAPILLARY
Glucose-Capillary: 128 mg/dL — ABNORMAL HIGH (ref 70–99)
Glucose-Capillary: 139 mg/dL — ABNORMAL HIGH (ref 70–99)
Glucose-Capillary: 131 mg/dL — ABNORMAL HIGH (ref 70–99)
Glucose-Capillary: 123 mg/dL — ABNORMAL HIGH (ref 70–99)

## 2021-10-14 LAB — COMPREHENSIVE METABOLIC PANEL
ALT: 14 U/L (ref 0–44)
AST: 17 U/L (ref 15–41)
Albumin: 2.7 g/dL — ABNORMAL LOW (ref 3.5–5.0)
Alkaline Phosphatase: 69 U/L (ref 38–126)
Anion gap: 8 (ref 5–15)
BUN: 28 mg/dL — ABNORMAL HIGH (ref 8–23)
CO2: 19 mmol/L — ABNORMAL LOW (ref 22–32)
Calcium: 8.9 mg/dL (ref 8.9–10.3)
Chloride: 108 mmol/L (ref 98–111)
Creatinine, Ser: 1.13 mg/dL (ref 0.61–1.24)
GFR, Estimated: >60 mL/min (ref >60)
Glucose, Bld: 127 mg/dL — ABNORMAL HIGH (ref 70–99)
Potassium: 4.3 mmol/L (ref 3.5–5.1)
Sodium: 135 mmol/L (ref 135–145)
Total Bilirubin: 3 mg/dL — ABNORMAL HIGH (ref 0.3–1.2)
Total Protein: 6.8 g/dL (ref 6.5–8.1)

## 2021-10-14 LAB — CBC WITH DIFFERENTIAL/PLATELET
Abs Immature Granulocytes: 0.1 10*3/uL — ABNORMAL HIGH (ref 0.00–0.07)
Basophils Absolute: 0 10*3/uL (ref 0.0–0.1)
Basophils Relative: 0 %
Eosinophils Absolute: 0 10*3/uL (ref 0.0–0.5)
Eosinophils Relative: 0 %
HCT: 45.4 % (ref 39.0–52.0)
Hemoglobin: 14.6 g/dL (ref 13.0–17.0)
Immature Granulocytes: 1 %
Lymphocytes Relative: 2 %
Lymphs Abs: 0.3 10*3/uL — ABNORMAL LOW (ref 0.7–4.0)
MCH: 26.2 pg (ref 26.0–34.0)
MCHC: 32.2 g/dL (ref 30.0–36.0)
MCV: 81.4 fL (ref 80.0–100.0)
Monocytes Absolute: 0.5 10*3/uL (ref 0.1–1.0)
Monocytes Relative: 4 %
Neutro Abs: 14 10*3/uL — ABNORMAL HIGH (ref 1.7–7.7)
Neutrophils Relative %: 93 %
Platelets: 118 10*3/uL — ABNORMAL LOW (ref 150–400)
RBC: 5.58 MIL/uL (ref 4.22–5.81)
RDW: 15.9 % — ABNORMAL HIGH (ref 11.5–15.5)
WBC: 14.9 10*3/uL — ABNORMAL HIGH (ref 4.0–10.5)
nRBC: 0 % (ref 0.0–0.2)

## 2021-10-14 LAB — AMMONIA: Ammonia: 44 umol/L — ABNORMAL HIGH (ref 9–35)

## 2021-10-14 LAB — PROCALCITONIN: Procalcitonin: 5.15 ng/mL

## 2021-10-14 MED ORDER — METRONIDAZOLE 500 MG PO TABS
500.0000 mg | ORAL_TABLET | Freq: Three times a day (TID) | ORAL | Status: DC
  Administered 2021-10-14 – 2021-10-15 (×3): 500 mg via ORAL
  Filled 2021-10-14 (×3): qty 1

## 2021-10-14 NOTE — Assessment & Plan Note (Addendum)
-   Initially required cleviprex drip which has subsequently been discontinued. ?-Patient placed on Cardizem and home regimen Lasix resumed.  ?-Outpatient follow-up with PCP. ?

## 2021-10-14 NOTE — Assessment & Plan Note (Addendum)
-  No seizures noted. ?-Patient was placed on Keppra. ?-Outpatient follow-up with neurology.  ?

## 2021-10-14 NOTE — Plan of Care (Signed)
?  Problem: Education: ?Goal: Knowledge of disease or condition will improve ?Outcome: Progressing ?Goal: Knowledge of secondary prevention will improve (SELECT ALL) ?Outcome: Progressing ?  ?Problem: Education: ?Goal: Knowledge of patient specific risk factors will improve (INDIVIDUALIZE FOR PATIENT) ?Outcome: Progressing ?  ?

## 2021-10-14 NOTE — Assessment & Plan Note (Signed)
noted 

## 2021-10-14 NOTE — Hospital Course (Signed)
HPI per Dr. Sidney Ace ?MATHEU PLOEGER is a 71 y.o. male with medical history significant for Afib on coumadin, colon cancer, Diastolic CHF, HTN, OSA, came in to [facility] for evaluation of AMS. ?Last known well presumably 12 PM when he felt acutely weak.  Family took him to lunch but he did not seem to be as interactive as his normal self.  He then became incontinent of urine and had some shaking of his left arm.  Patient unable to provide reliable history.  Brought into [facility] where he was seen by telemedicine neurology.  Hypertensive with blood pressures in the 160s.  CT head with acute small intraparenchymal hematoma within the left temporal occipital junction just inferior to the atrium and occipital horn of the left lateral ventricle.  No frank IVH.  CTA head and neck negative for underlying vascular malformation.  While a neurologist was examining the patient he had a witnessed complex partial seizure involving the right hemibody and rightward gaze deviation. He was given benzos and loaded with Keppra.  Coumadin was reversed ?Transferred to [facility] for further management. ? ?The patient continued to have tachycardia and developed fever the evening of 10/12/2021, [facility] hospitalist was asked to consult with the patient for medical management.  He denied any chest pain or palpitations.  No nausea or vomiting or abdominal pain.  No significant cough or wheezing or dyspnea.  No urinary frequency or urgency, dysuria or flank pain.  No sore throat or earache or rhinorrhea or nasal congestion.  He denies any headache or dizziness or blurred vision. ? ?His temperature was 102.7 with BP of 144/90 and respiratory rate of 25 with heart rate 128 and pulse currently was 97% on room air.  Labs revealed a sodium of 133 with a CO2 of 21 with otherwise unremarkable BMP.  CBC showed leukocytosis of 15.6 with platelets of 128 and mild microcytosis. ?EKG as reviewed by me : His EKG showed atrial fibrillation with  rapid ventricular response of 120 with Q waves anteroseptally and T wave inversion laterally. ?Imaging: Portable chest x-ray showed right basilar atelectatic changes and right hilar prominence likely presenting patient rotation.  ?

## 2021-10-14 NOTE — Progress Notes (Addendum)
STROKE TEAM PROGRESS NOTE  ? ?INTERVAL HISTORY ? ?Patient afebrile overnight, blood cultures and urine cultures unremarkable. He reports feeling much better than yesterday and asks about discharge. He is disappointed about current recommendation for SNF but is optimistic about working with PT and OT today.    ? ?Vitals:  ? 10/14/21 0358 10/14/21 0756 10/14/21 0900 10/14/21 1138  ?BP: 116/75 130/86  138/83  ?Pulse: 74 70  86  ?Resp: '20 20 18 20  '$ ?Temp: 97.7 ?F (36.5 ?C) 97.9 ?F (36.6 ?C)  98.1 ?F (36.7 ?C)  ?TempSrc: Oral Oral  Oral  ?SpO2: 100% 97%  98%  ? ?CBC:  ?Recent Labs  ?Lab 10/13/21 ?0725 10/14/21 ?0246  ?WBC 16.7* 14.9*  ?NEUTROABS 14.6* 14.0*  ?HGB 14.7 14.6  ?HCT 45.0 45.4  ?MCV 80.4 81.4  ?PLT 138* 118*  ? ?Basic Metabolic Panel:  ?Recent Labs  ?Lab 10/13/21 ?0725 10/14/21 ?0246  ?NA 133* 135  ?K 3.9 4.3  ?CL 102 108  ?CO2 22 19*  ?GLUCOSE 92 127*  ?BUN 17 28*  ?CREATININE 1.22 1.13  ?CALCIUM 9.0 8.9  ? ?Lipid Panel:  ?Recent Labs  ?Lab 10/10/21 ?5883  ?CHOL 125  ?TRIG 86  ?HDL 46  ?CHOLHDL 2.7  ?VLDL 17  ?[place] 62  ? ?HgbA1c:  ?Recent Labs  ?Lab 10/10/21 ?2549  ?HGBA1C 5.0  ? ?Urine Drug Screen:  ?Recent Labs  ?Lab 10/09/21 ?1727  ?LABOPIA NONE DETECTED  ?COCAINSCRNUR NONE DETECTED  ?LABBENZ NONE DETECTED  ?AMPHETMU NONE DETECTED  ?THCU NONE DETECTED  ?LABBARB NONE DETECTED  ?  ?Alcohol Level  ?Recent Labs  ?Lab 10/09/21 ?1646  ?ETH <10  ? ? ?IMAGING past 24 hours ?No results found. ? ?PHYSICAL EXAM ? ?Physical Exam  ?Constitutional: Appears well-developed and well-nourished.  Middle-aged African-American male ?Psych: Affect appropriate to situation ?Cardiovascular: irregular rate and rhythm.  ?Respiratory: Effort normal, non-labored breathing ? ?Neuro: ?Mental Status: ?As above ?Cranial Nerves: ?II: Visual Fields are full. Pupils are round, and reactive to light. R pupil 0.5 mm larger than L.  ?III,IV, VI: EOMI without ptosis or diploplia.  ?V: Facial sensation is symmetric to temperature ?VII:  Facial movement is symmetric resting and smiling ?VIII: Hearing is intact to voice ?X: Palate elevates symmetrically ?XI: Shoulder shrug is symmetric. ?XII: Tongue protrudes midline without atrophy or fasciculations.  ?Motor: ?Tone is normal. Bulk is normal. 5/5 strength in all extremities. ?Sensory: ?Sensation is symmetric to light touch and temperature in the arms and legs. ?Cerebellar: ?FNF intact ? ? ? ?ASSESSMENT/PLAN ?Steven Barnett is a 71 y.o. male PMH of Afib on coumadin, colon cancer, Diastolic CHF, HTN, OSA, came in to [facility] for evaluation of AMS. CT head with acute small intraparenchymal hematoma within the left temporal occipital junction just inferior to the atrium and occipital horn of the left lateral ventricle. CTA head and neck negative for underlying vascular malformation. Coumadin reversed. Had a witnessed GTC event with R gaze deviation for which he was Keppra loaded and remains on 500 mg BID.   ? 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   ?Diet  ? Diet Heart  Room service appropriate? Yes with Assist; Fluid consistency: Thin  ? ?aspirin 81 mg daily and warfarin 4 mg daily prior to admission, now on ASA 81 mg (started 4/12 after CTH showing stable area of hemorrhage) ?Therapy recommendations:  PT rec SNF  ?Disposition:  SNF ? ?Hypertension ?Home meds: NA ?Unstable ?Strict BP goal with PRNs as below ?Long-term BP goal normotensive ? ?Hyperlipidemia ?Home meds: NA ?LDL 62, goal < 70 ?No statin indicated ?Continue statin at discharge ? ?Glucose control ?Home meds:  NA ?HgbA1c 5.0, goal  < 7.0 ?CBGs ?Recent Labs  ?  10/13/21 ?2143 10/14/21 ?0620 10/14/21 ?1137  ?GLUCAP 134* 128* 139*  ?  ?SSI ? ?Other Stroke Risk Factors ?Advanced Age >/= 54  ?Cigarette smoker, advised to stop smoking ?ETOH use, alcohol level <10, advised to drink no more than 1 drink(s) a day ?Obesity, BMI >/= 30 associated with increased stroke risk, recommend weight loss, diet and exercise as appropriate  ?Obstructive sleep apnea, on CPAP at home ?Congestive heart failure ? ?Other Active Problems: ? ?Subcortical ICH, nontraumatic ?Treatment: ?-Admit to nicu ?-ICH Score: 0 ?-ICH Volume: 1cc  ?-BP control goal SYS 130-160 ?-PT/OT/ST  ?-Close neuro monitoring ?  ?CNS ?Seizure-focal with likely secondary generalization x2.  ?-Currently back to baseline ?-Loaded with Keppra in ER prior to transfer ?-Continue Keppra 500 twice daily ?-EEG negative for continued sz ?  ?RESP ?Stable respiratory status ?CXR negative for PNA, but still possible ?-Medicine consult ?-Continue BS ABX ?  ?CV ?Hypertensive Emergency ?-Aggressive BP control, goal SBP 130-160 ?-cleviprex no longer needed ?-Hydral 20 mg q6hrs prn ?-amlodipine 5 mg daily ?  ?Chronic atrial -fibrillation (RVR overnight 4/11-4/12) ?-Medicine consult ?-S/p metop 5 mg IV ?-HR WNL currently ?-Continue home Cardizem CD 120 mg daily ? ?GI/GU ?No active issues ?  ?HEME ?Coagulopathy secondary to anticoagulation ?Thrombocytopenia ?-Goal INR is< 1.3 ?  ?Fluid/Electrolyte Disorders ?No active issues ?Check CMP ?  ?ID ?SIRS: febrile overnight (4/11-4/12) and with RVR, appears to be resolving, infectious workup negative ?-Medicine consult ?-Continue BS ABX for now, defer to medical team's judgement for discontinuation ?-f/u blood cultures ?  ?Nutrition ?E66.9 Obesity  ?-diet consult ? ? ?Hospital day # 5 ? ? ?Corky Sox, MD ?PGY-1 ? ?I have personally obtained history,examined this patient, reviewed notes, independently viewed imaging studies, participated in medical decision making and plan  of care.ROS completed by me personally and pertinent positives fully documented  I have made any additions or clarifications directly to the above note. Agree with note above.  Patient looks much better today and feels he is able to walk better with therapy as he had on downgraded the recommendations yesterday to SNF which he does not want to go to.  Hopefully he will do better with therapy today and is able to go home the next 1 or 2 days if continues to improve.  Greater than 50% time during this 35-minute visit was spent in counseling and coordination of care and discussion patient and care team ? ?Antony Contras, MD ?Medical D3rector ?[facility] ?Pager: [phone] ?10/14/2021 2:22 PM ? ? ?To contact Stroke Continuity provider, please refer to [email or web address] ?After hours, contact General Neurology ? ?

## 2021-10-14 NOTE — Progress Notes (Signed)
?PROGRESS NOTE/consult ? ? ? ?Steven Barnett  BSW:[id number] DOB: 1951-05-02 DOA: 10/09/2021 ?PCP: Elisabeth Cara, PA-C  ? ? ?Chief Complaint  ?Patient presents with  ? Altered Mental Status  ? ? ?Brief Narrative:  ?HPI per Dr. Sidney Ace ?Steven Barnett is a 71 y.o. male with medical history significant for Afib on coumadin, colon cancer, Diastolic CHF, HTN, OSA, came in to [facility] for evaluation of AMS. ?Last known well presumably 12 PM when he felt acutely weak.  Family took him to lunch but he did not seem to be as interactive as his normal self.  He then became incontinent of urine and had some shaking of his left arm.  Patient unable to provide reliable history.  Brought into [place] where he was seen by telemedicine neurology.  Hypertensive with blood pressures in the 160s.  CT head with acute small intraparenchymal hematoma within the left temporal occipital junction just inferior to the atrium and occipital horn of the left lateral ventricle.  No frank IVH.  CTA head and neck negative for underlying vascular malformation.  While a neurologist was examining the patient he had a witnessed complex partial seizure involving the right hemibody and rightward gaze deviation. He was given benzos and loaded with Keppra.  Coumadin was reversed ?Transferred to [facility] for further management. ? ?The patient continued to have tachycardia and developed fever the evening of 10/12/2021, [facility] hospitalist was asked to consult with the patient for medical management.  He denied any chest pain or palpitations.  No nausea or vomiting or abdominal pain.  No significant cough or wheezing or dyspnea.  No urinary frequency or urgency, dysuria or flank pain.  No sore throat or earache or rhinorrhea or nasal congestion.  He denies any headache or dizziness or blurred vision. ? ?His temperature was 102.7 with BP of 144/90 and respiratory rate of 25 with heart rate 128 and pulse currently was 97% on room air.  Labs  revealed a sodium of 133 with a CO2 of 21 with otherwise unremarkable BMP.  CBC showed leukocytosis of 15.6 with platelets of 128 and mild microcytosis. ?EKG as reviewed by me : His EKG showed atrial fibrillation with rapid ventricular response of 120 with Q waves anteroseptally and T wave inversion laterally. ?Imaging: Portable chest x-ray showed right basilar atelectatic changes and right hilar prominence likely presenting patient rotation.   ? ? ?Assessment & Plan: ? Active Problems: ?  SIRS (systemic inflammatory response syndrome) (HCC) ?  Atrial fibrillation with rapid ventricular response ([place]) ?  ICH (intracerebral hemorrhage) ([place]) ?  Morbid obesity ([place]) ?  Hyponatremia ?  Gout ?  Hypertensive emergency ?  Seizure ([place]) ? ? ? ?Assessment and Plan: ?SIRS (systemic inflammatory response syndrome) (HCC) ?- The patient has a fever of unknown etiology.??  Secondary to gout flare. ?- We will need to rule out bacteremia and sepsis at this time.  Differential diagnoses include atelectasis. ?- Patient pancultured and placed empirically on IV vancomycin, IV Flagyl, IV cefepime pending culture results as patient noted to have a leukocytosis. ?-Procalcitonin noted at 3.18. ?- His urinalysis came back unremarkable except for 100 protein. ?- Given abnormal portable chest x-ray 2 view was ordered but not done.   ?-Patient placed on incentive spirometry, flutter valve.   ?-MRSA PCR negative.   ?-Patient also placed on steroids due to concern for acute gouty flare.   ?-Currently afebrile x24 hours.   ?-Discontinue IV vancomycin.   ?-Change Flagyl  to oral Flagyl.   ?-Continue IV cefepime . ?-Follow. ? ?Atrial fibrillation with rapid ventricular response ([place]) ?-This has continued despite fever control. ?- She was therefore given 5 mg of IV Lopressor on 10/13/2021. ?-Heart rate improved. ?-Norvasc discontinued and patient currently on Cardizem CD 120 mg daily which we will continue. ?-Patient did not need a Cardizem drip. ?-  Continue further management of SIRS and potential sepsis. ?-Due to [place] Coumadin discontinued. ?-We will defer resumption of anticoagulation to primary team, neurology versus starting patient on aspirin 81 mg daily in 1 to 2 weeks however will defer to primary team of neurology ? ? ?ICH (intracerebral hemorrhage) ([place]) ?- Management per primary service, neurology. ?-  Coumadin was held off given his ICH. ? ?Seizure ([place]) ?- Currently stable. ?-No seizures noted. ?-Continue Keppra. ?-Per primary team, neurology. ? ?Hypertensive emergency ?- Initially required cleviprex drip which has subsequently been discontinued. ?-Continue Cardizem. ?-Blood pressure systolic in the 974B. ?-Further management per primary team, neurology. ? ?Gout ?- Patient noted with history of gout as noted to be on colchicine prior to admission  ?-Patient noted on 10/12/2021 to spike fevers, be tachycardic.  -On assessment patient with complaints of diffuse bilateral knee bilateral ankle and left great toe pain to the point he was having difficulty ambulating.   ?-Patient given IV Solu-Medrol x1 on 10/13/2021, placed on oral prednisone x5 days with clinical improvement.   ?-Once patient has completed treatment for acute flare may benefit from being placed on allopurinol.   ?-Outpatient follow-up with PCP. ? ?Hyponatremia ?- This may be hypovolemic. ?-Improved with hydration. ? ?Morbid obesity ([place]) ?- Lifestyle modification. ?-Outpatient follow-up with PCP ? ? ? ? ?  ? ? ?DVT prophylaxis: Lovenox ?Code Status: Full ?Family Communication: Updated patient.  No family at bedside. ?Disposition: Likely SNF ? ?Status is: Inpatient ?Remains inpatient appropriate because: Severity of illness. ?  ?Consultants:  ?[facility] hospitalist Dr. Sidney Ace 10/13/2021 ? ?Procedures:  ?CT head without contrast 10/13/2021, 10/09/2021 ?CT angio head and neck 10/09/2021 ? ?Chest x-ray 10/13/2021 ?MRI brain 10/10/2021 ?2D echo 10/10/2021 ? ? ? ? ?Antimicrobials:  ?IV cefepime  10/13/2021>>> ?IV Flagyl 10/13/2021>>>> oral Flagyl 10/14/2021>>> ?IV vancomycin 10/13/2021>>>> 10/15/2021 ? ? ? ?Subjective: ?States bilateral knee and ankle and left great toe pain improving since yesterday after trial of steroids.  No chest pain.  No shortness of breath.  Overall stating he is feeling better.. ? ?Objective: ?Vitals:  ? 10/14/21 1138 10/14/21 1300 10/14/21 1500 10/14/21 1511  ?BP: 138/83   (!) 132/92  ?Pulse: 86   76  ?Resp: '20 18 19 18  '$ ?Temp: 98.1 ?F (36.7 ?C)   98.3 ?F (36.8 ?C)  ?TempSrc: Oral   Oral  ?SpO2: 98%   100%  ? ? ?Intake/Output Summary (Last 24 hours) at 10/14/2021 1557 ?Last data filed at 10/14/2021 1518 ?Gross per 24 hour  ?Intake 4786.58 ml  ?Output 1350 ml  ?Net 3436.58 ml  ? ?There were no vitals filed for this visit. ? ?Examination: ? ?General exam: Appears calm and comfortable  ?Respiratory system: Clear to auscultation. Respiratory effort normal. ?Cardiovascular system: S1 & S2 heard, RRR. No JVD, murmurs, rubs, gallops or clicks. No pedal edema. ?Gastrointestinal system: Abdomen is nondistended, soft and nontender. No organomegaly or masses felt. Normal bowel sounds heard. ?Central nervous system: Alert and oriented. No focal neurological deficits. ?Extremities: Bilateral knees, ankles, left great toe with decreased tenderness to palpation. ?Skin: No rashes, lesions or ulcers ?Psychiatry: Judgement and insight appear normal. Mood &  affect appropriate.  ? ? ? ?Data Reviewed:  ? ?CBC: ?Recent Labs  ?Lab 10/09/21 ?1646 10/10/21 ?0434 10/11/21 ?0228 10/13/21 ?0329 10/13/21 ?0725 10/14/21 ?0246  ?WBC 6.4 7.8 7.6 15.6* 16.7* 14.9*  ?NEUTROABS 5.4  --   --   --  14.6* 14.0*  ?HGB 15.4 14.7 14.7 15.1 14.7 14.6  ?HCT 47.4 45.3 45.0 45.0 45.0 45.4  ?MCV 83.3 81.8 80.8 79.4* 80.4 81.4  ?PLT 117* 130* 125* 128* 138* 118*  ? ? ?Basic Metabolic Panel: ?Recent Labs  ?Lab 10/10/21 ?0434 10/11/21 ?0228 10/13/21 ?0329 10/13/21 ?0725 10/14/21 ?0246  ?NA 136 136 133* 133* 135  ?K 3.4* 3.6 3.8 3.9 4.3   ?CL 100 103 102 102 108  ?CO2 27 26 21* 22 19*  ?GLUCOSE 84 86 90 92 127*  ?BUN '9 10 17 17 '$ 28*  ?CREATININE 0.85 0.92 1.17 1.22 1.13  ?CALCIUM 8.8* 9.0 9.1 9.0 8.9  ? ? ?GFR: ?CrCl cannot be calculated (Unknown i

## 2021-10-15 DIAGNOSIS — I161 Hypertensive emergency: Secondary | ICD-10-CM | POA: Diagnosis not present

## 2021-10-15 DIAGNOSIS — E871 Hypo-osmolality and hyponatremia: Secondary | ICD-10-CM | POA: Diagnosis not present

## 2021-10-15 DIAGNOSIS — I4891 Unspecified atrial fibrillation: Secondary | ICD-10-CM | POA: Diagnosis not present

## 2021-10-15 DIAGNOSIS — M109 Gout, unspecified: Secondary | ICD-10-CM | POA: Diagnosis not present

## 2021-10-15 LAB — CBC WITH DIFFERENTIAL/PLATELET
Abs Immature Granulocytes: 0.14 10*3/uL — ABNORMAL HIGH (ref 0.00–0.07)
Basophils Absolute: 0 10*3/uL (ref 0.0–0.1)
Basophils Relative: 0 %
Eosinophils Absolute: 0 10*3/uL (ref 0.0–0.5)
Eosinophils Relative: 0 %
HCT: 38.5 % — ABNORMAL LOW (ref 39.0–52.0)
Hemoglobin: 12.7 g/dL — ABNORMAL LOW (ref 13.0–17.0)
Immature Granulocytes: 1 %
Lymphocytes Relative: 2 %
Lymphs Abs: 0.3 10*3/uL — ABNORMAL LOW (ref 0.7–4.0)
MCH: 26.5 pg (ref 26.0–34.0)
MCHC: 33 g/dL (ref 30.0–36.0)
MCV: 80.4 fL (ref 80.0–100.0)
Monocytes Absolute: 1 10*3/uL (ref 0.1–1.0)
Monocytes Relative: 7 %
Neutro Abs: 13.3 10*3/uL — ABNORMAL HIGH (ref 1.7–7.7)
Neutrophils Relative %: 90 %
Platelets: 146 10*3/uL — ABNORMAL LOW (ref 150–400)
RBC: 4.79 MIL/uL (ref 4.22–5.81)
RDW: 15.9 % — ABNORMAL HIGH (ref 11.5–15.5)
WBC: 14.8 10*3/uL — ABNORMAL HIGH (ref 4.0–10.5)
nRBC: 0 % (ref 0.0–0.2)

## 2021-10-15 LAB — BASIC METABOLIC PANEL
Anion gap: 5 (ref 5–15)
BUN: 41 mg/dL — ABNORMAL HIGH (ref 8–23)
CO2: 20 mmol/L — ABNORMAL LOW (ref 22–32)
Calcium: 8.6 mg/dL — ABNORMAL LOW (ref 8.9–10.3)
Chloride: 108 mmol/L (ref 98–111)
Creatinine, Ser: 1.12 mg/dL (ref 0.61–1.24)
GFR, Estimated: >60 mL/min (ref >60)
Glucose, Bld: 115 mg/dL — ABNORMAL HIGH (ref 70–99)
Potassium: 4.4 mmol/L (ref 3.5–5.1)
Sodium: 133 mmol/L — ABNORMAL LOW (ref 135–145)

## 2021-10-15 LAB — GLUCOSE, CAPILLARY
Glucose-Capillary: 114 mg/dL — ABNORMAL HIGH (ref 70–99)
Glucose-Capillary: 126 mg/dL — ABNORMAL HIGH (ref 70–99)
Glucose-Capillary: 135 mg/dL — ABNORMAL HIGH (ref 70–99)
Glucose-Capillary: 144 mg/dL — ABNORMAL HIGH (ref 70–99)

## 2021-10-15 LAB — MAGNESIUM: Magnesium: 2.3 mg/dL (ref 1.7–2.4)

## 2021-10-15 LAB — PROCALCITONIN: Procalcitonin: 4.37 ng/mL

## 2021-10-15 MED ORDER — LEVOFLOXACIN 500 MG PO TABS
500.0000 mg | ORAL_TABLET | Freq: Every day | ORAL | Status: AC
  Administered 2021-10-15 – 2021-10-17 (×3): 500 mg via ORAL
  Filled 2021-10-15 (×4): qty 1

## 2021-10-15 NOTE — TOC Progression Note (Signed)
Transition of Care (TOC) - Progression Note  ? ? ?Patient Details  ?Name: Steven Barnett ?MRN: [id number] ?Date of Birth: [birth year] ? ?Transition of Care (TOC) CM/SW Contact  ?Geralynn Ochs, LCSW ?Phone Number: ?10/15/2021, 3:57 PM ? ?Clinical Narrative:   CSW received update from PT that patient is still a heavy assist for mobility, and SNF is still strongly encouraged for patient at this time. CSW contacted patient's daughter, [name], to discuss and encourage SNF placement at discharge. CSW read to [name] how patient performed with PT today and that he does not feel that he is able to return home at this time, but daughter is still not interested in pursuing SNF at this time, wants him to go home with outpatient. Per PT, they will see him again over the weekend to watch for any progress, and [name] would like to be updated on progress. CSW to follow. ? ? ? ?Expected Discharge Plan: OP Rehab ?Barriers to Discharge: Continued Medical Work up ? ?Expected Discharge Plan and Services ?Expected Discharge Plan: OP Rehab ?In-house Referral: Clinical Social Work ?  ?Post Acute Care Choice:  (OP Therapy) ?Living arrangements for the past 2 months: [place] ?                ?  ?  ?  ?  ?  ?  ?  ?  ?  ?  ? ? ?Social Determinants of Health (SDOH) Interventions ?  ? ?Readmission Risk Interventions ?   ? View : No data to display.  ?  ?  ?  ? ? ?

## 2021-10-15 NOTE — Progress Notes (Signed)
Physical Therapy Treatment ?Patient Details ?Name: Steven Barnett ?MRN: [id number] ?DOB: [birth year] ?Today's Date: 10/15/2021 ? ? ?History of Present Illness 71 y.o. male presents to [facility] hospital as a transfer from [place] on 10/09/2021 with AMS, incontinence and weakness. 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 Pt experienced a seizure when at [place]. PMH includes Afib on coumadin, colon cancer, Diastolic CHF, HTN, OSA.  Pt. with significant B LE pain secondary to gout and code sepsis 4/12. ? ?  ?PT Comments  ? ? Focus of session today was functional tranfers, standing balance, and short distance gait. The patient tolerated well.  Pt. Shows overall improvement with his transfer ability, standing balance, and gait. The patient ambulated 7 feet x 2 bouts with min A and mod sequencing cues for direction and stepping. Overall functional mobility, transfer assistance, balance, LE strength, and functional endurance are still limiting function. Pt. Requires mod-max A for functional mobility and up to mod A for corrective standing balance. Pt. Would benefit from skilled PT to continue to address his functional mobility, strength, balance, endurance, and gait. Discussed d/c planning with patient and he states he would not be comfortable going home at currently functional level. Plan and discharge setting remains unchanged. Pt to follow acutely as appropriate.  ?   ?Recommendations for follow up therapy are one component of a multi-disciplinary discharge planning process, led by the attending physician.  Recommendations may be updated based on patient status, additional functional criteria and insurance authorization. ? ?Follow Up Recommendations ? Skilled nursing-short term rehab (<3 hours/day) ?  ?  ?Assistance Recommended at Discharge Frequent or constant Supervision/Assistance  ?Patient can return home with the following Assistance with cooking/housework;Direct supervision/assist for medications  management;Direct supervision/assist for financial management;Assist for transportation;Help with stairs or ramp for entrance;A lot of help with bathing/dressing/bathroom;A lot of help with walking and/or transfers ?  ?Equipment Recommendations ? None recommended by PT  ?  ?Recommendations for Other Services   ? ? ?  ?Precautions / Restrictions Precautions ?Precautions: Fall ?Restrictions ?Weight Bearing Restrictions: No  ?  ? ?Mobility ? Bed Mobility ?Overal bed mobility: Needs Assistance ?  ?  ?  ?  ?  ?  ?General bed mobility comments: Pt EOB upon PT arrival ?  ? ?Transfers ?Overall transfer level: Needs assistance ?Equipment used: Rolling walker (2 wheels) ?  ?Sit to Stand: Mod assist ?  ?  ?  ?  ?  ?General transfer comment: Pt. mod A for power up and steady, mod A for posterior LOB ?  ? ?Ambulation/Gait ?Ambulation/Gait assistance: Mod assist, +2 safety/equipment ?Gait Distance (Feet): 7 Feet (x2 bouts) ?Assistive device: Rolling walker (2 wheels) ?Gait Pattern/deviations: Step-to pattern, Decreased stride length, Wide base of support, Trunk flexed, Decreased weight shift to left ?Gait velocity: decreased ?  ?  ?General Gait Details: Complains of legs feeling stiff, requires constant directional cuing for where he needs to step and support for balance. ? ? ?Stairs ?  ?  ?  ?  ?  ? ? ?Wheelchair Mobility ?  ? ?Modified Rankin (Stroke Patients Only) ?Modified Rankin (Stroke Patients Only) ?Pre-Morbid Rankin Score: No significant disability ?Modified Rankin: Moderately severe disability ? ? ?  ?Balance Overall balance assessment: Independent ?Sitting-balance support: Feet supported ?Sitting balance-Leahy Scale: Fair ?Sitting balance - Comments: Able to sit EOB and eat food w/o issue ?Postural control: Posterior lean ?Standing balance support: Bilateral upper extremity supported ?Standing balance-Leahy Scale: Poor ?Standing balance comment: Requires RW and assist  to stand and steady. Requires support for all  dynamic movement ?  ?  ?  ?  ?  ?  ?  ?  ?  ?  ?  ?  ? ?  ?Cognition Arousal/Alertness: Awake/alert ?Behavior During Therapy: [facility] for tasks assessed/performed ?Overall Cognitive Status: Impaired/Different from baseline ?Area of Impairment: Safety/judgement, Memory, Following commands, Awareness, Problem solving ?  ?  ?  ?  ?  ?  ?  ?  ?  ?  ?Memory: Decreased short-term memory ?Following Commands: Follows one step commands consistently, Follows multi-step commands inconsistently ?Safety/Judgement: Decreased awareness of deficits ?Awareness: Emergent ?Problem Solving: Slow processing, Decreased initiation ?General Comments: Pt. tangential throughout session and poor historian about home support level. When asked if he felt like he was safe to go home at his current functional level, he said "no I am not." ?  ?  ? ?  ?Exercises   ? ?  ?General Comments General comments (skin integrity, edema, etc.): Pt requires redirection throughout session to stay on task. Significant sequencing cues for all mobility. ?  ?  ? ?Pertinent Vitals/Pain Pain Assessment ?Faces Pain Scale: Hurts a little bit ?Pain Location: LE ?Pain Descriptors / Indicators: Aching, Sore ?Pain Intervention(s): Limited activity within patient's tolerance, Monitored during session  ? ? ?Home Living   ?  ?  ?  ?  ?  ?  ?  ?  ?  ?   ?  ?Prior Function    ?  ?  ?   ? ?PT Goals (current goals can now be found in the care plan section) Acute Rehab PT Goals ?Patient Stated Goal: to return to independence ?PT Goal Formulation: With patient ?Time For Goal Achievement: 10/24/21 ?Potential to Achieve Goals: Fair ?Progress towards PT goals: Progressing toward goals ? ?  ?Frequency ? ? ? Min 3X/week ? ? ? ?  ?PT Plan Current plan remains appropriate  ? ? ?Co-evaluation   ?  ?  ?  ?  ? ?  ?AM-PAC PT "6 Clicks" Mobility   ?Outcome Measure ? Help needed turning from your back to your side while in a flat bed without using bedrails?: A Lot ?Help needed moving from lying on  your back to sitting on the side of a flat bed without using bedrails?: A Lot ?Help needed moving to and from a bed to a chair (including a wheelchair)?: A Lot ?Help needed standing up from a chair using your arms (e.g., wheelchair or bedside chair)?: A Lot ?Help needed to walk in hospital room?: A Little ?Help needed climbing 3-5 steps with a railing? : Total ?6 Click Score: 12 ? ?  ?End of Session Equipment Utilized During Treatment: Gait belt ?Activity Tolerance: Patient limited by pain ?Patient left: in chair;with call bell/phone within reach;with chair alarm set ?Nurse Communication: Mobility status ?PT Visit Diagnosis: Other abnormalities of gait and mobility (R26.89);Muscle weakness (generalized) (M62.81);Other symptoms and signs involving the nervous system (R29.898);Pain ?Pain - Right/Left:  (B) ?Pain - part of body: Knee ?  ? ? ?Time: 8016-5537 ?PT Time Calculation (min) (ACUTE ONLY): 22 min ? ?Charges:  $Therapeutic Activity: 8-22 mins          ?          ? ?Thermon Leyland, SPT ?Acute Rehab Services ? ? ? ?Thermon Leyland ?10/15/2021, 2:30 PM ? ?

## 2021-10-15 NOTE — Progress Notes (Addendum)
STROKE TEAM PROGRESS NOTE  ? ?INTERVAL HISTORY ? ?Patient denies new complaints this morning. Denies fever, chest pain, SoB, dysuria. PT worked with patient and continued recommendation of SNF. ?Vital signs stable.Neuro exam unchanged. ?Vitals:  ? 10/15/21 0002 10/15/21 0400 10/15/21 0749 10/15/21 1105  ?BP: 130/85 136/86 (!) 141/91 127/80  ?Pulse: 72 75 76 70  ?Resp: '19  17 20  '$ ?Temp: 99.1 ?F (37.3 ?C) 98.4 ?F (36.9 ?C) 97.7 ?F (36.5 ?C) 97.6 ?F (36.4 ?C)  ?TempSrc: Oral Oral Oral Oral  ?SpO2: 100% 100% 97% 99%  ? ?CBC:  ?Recent Labs  ?Lab 10/14/21 ?0246 10/15/21 ?0311  ?WBC 14.9* 14.8*  ?NEUTROABS 14.0* 13.3*  ?HGB 14.6 12.7*  ?HCT 45.4 38.5*  ?MCV 81.4 80.4  ?PLT 118* 146*  ? ?Basic Metabolic Panel:  ?Recent Labs  ?Lab 10/14/21 ?0246 10/15/21 ?0311  ?NA 135 133*  ?K 4.3 4.4  ?CL 108 108  ?CO2 19* 20*  ?GLUCOSE 127* 115*  ?BUN 28* 41*  ?CREATININE 1.13 1.12  ?CALCIUM 8.9 8.6*  ?MG  --  2.3  ? ?Lipid Panel:  ?Recent Labs  ?Lab 10/10/21 ?1610  ?CHOL 125  ?TRIG 86  ?HDL 46  ?CHOLHDL 2.7  ?VLDL 17  ?[place] 62  ? ?HgbA1c:  ?Recent Labs  ?Lab 10/10/21 ?9604  ?HGBA1C 5.0  ? ?Urine Drug Screen:  ?Recent Labs  ?Lab 10/09/21 ?1727  ?LABOPIA NONE DETECTED  ?COCAINSCRNUR NONE DETECTED  ?LABBENZ NONE DETECTED  ?AMPHETMU NONE DETECTED  ?THCU NONE DETECTED  ?LABBARB NONE DETECTED  ?  ?Alcohol Level  ?Recent Labs  ?Lab 10/09/21 ?1646  ?ETH <10  ? ? ?IMAGING past 24 hours ?No results found. ? ?PHYSICAL EXAM ? ?Physical Exam  ?Constitutional: Appears well-developed and well-nourished.  Middle-aged African-American male ?Psych: Affect appropriate to situation ?Cardiovascular: irregular rate and rhythm.  ?Respiratory: Effort normal, non-labored breathing ? ?Neuro: ?Mental Status: ?As above ?Cranial Nerves: ?II: Visual Fields are full. Pupils are round, and reactive to light. R pupil 0.5 mm larger than L.  ?III,IV, VI: EOMI without ptosis or diploplia.  ?V: Facial sensation is symmetric to temperature ?VII: Facial movement is  symmetric resting and smiling ?VIII: Hearing is intact to voice ?X: Palate elevates symmetrically ?XI: Shoulder shrug is symmetric. ?XII: Tongue protrudes midline without atrophy or fasciculations.  ?Motor: ?Tone is normal. Bulk is normal. 5/5 strength in all extremities. ?Sensory: ?Sensation is symmetric to light touch and temperature in the arms and legs. ?Cerebellar: ?FNF intact ? ? ? ?ASSESSMENT/PLAN ?Steven Barnett is a 71 y.o. male PMH of Afib on coumadin, colon cancer, Diastolic CHF, HTN, OSA, came in to [facility] for evaluation of AMS. CT head with acute small intraparenchymal hematoma within the left temporal occipital junction just inferior to the atrium and occipital horn of the left lateral ventricle. CTA head and neck negative for underlying vascular malformation. Coumadin reversed. Had a witnessed GTC event with R gaze deviation for which he was Keppra loaded and remains on 500 mg BID.   ? 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   ?Diet  ?  Diet Heart Room service appropriate? Yes with Assist; Fluid consistency: Thin  ? ?aspirin 81 mg daily and warfarin 4 mg daily prior to admission, now on ASA 81 mg (started 4/12 after CTH showing stable area of hemorrhage) ?Therapy recommendations:  PT rec SNF  ?Disposition:  SNF ? ?Hypertension ?Home meds: NA ?Unstable ?Strict BP goal with PRNs as below ?Long-term BP goal normotensive ? ?Hyperlipidemia ?Home meds: NA ?LDL 62, goal < 70 ?No statin indicated ?Continue statin at discharge ? ?Glucose  control ?Home meds:  NA ?HgbA1c 5.0, goal < 7.0 ?CBGs ?Recent Labs  ?  10/14/21 ?2118 10/15/21 ?0611 10/15/21 ?1210  ?GLUCAP 123* 114* 126*  ?  ?SSI ? ?Other Stroke Risk Factors ?Advanced Age >/= 33  ?Cigarette smoker, advised to stop smoking ?ETOH use, alcohol level <10, advised to drink no more than 1 drink(s) a day ?Obesity, BMI >/= 30 associated with increased stroke risk, recommend weight loss, diet and exercise as appropriate  ?Obstructive sleep apnea, on CPAP at home ?Congestive heart failure ? ?Other Active Problems: ? ?Subcortical ICH, nontraumatic ?Treatment: ?-Admit to nicu ?-ICH Score: 0 ?-ICH Volume: 1cc  ?-BP control goal SYS 130-160 ?-PT/OT/ST  ?-Close neuro monitoring ?  ?CNS ?Seizure-focal with likely secondary generalization x2.  ?-Currently back to baseline ?-Loaded with Keppra in ER prior to transfer ?-Continue Keppra 500 twice daily ?-EEG negative for continued sz ?  ?RESP ?Stable respiratory status ?  ?CV ?Hypertensive Emergency ?-Aggressive BP control, goal SBP 130-160 ?-cleviprex no longer needed ?-Hydral 20 mg q6hrs prn ?-amlodipine 5 mg daily ?  ?Chronic atrial -fibrillation (RVR overnight 4/11-4/12) ?-Medicine consult ?-S/p metop 5 mg IV ?-HR WNL currently ?-Continue home Cardizem CD 120 mg daily ? ?GI/GU ?No active issues ?  ?HEME ?Coagulopathy secondary to anticoagulation ?Thrombocytopenia ?-Goal INR is< 1.3 ?  ?Fluid/Electrolyte Disorders ?No active issues ?  ?Nutrition ?E66.9 Obesity  ?-diet consult ? ? ?Hospital day # 6 ? ? ?Corky Sox, MD ?PGY-1 ?I have personally obtained history,examined this patient, reviewed notes, independently viewed imaging studies, participated in medical decision making and plan of care.ROS completed by me personally and pertinent positives fully documented  I have made any additions or clarifications directly to the above note. Agree with note above. Continue PT/OT. Pt does not want to go to SNF. Hopefully he improves over the week end to go home next  week.I have spent a total of  35  minutes with the patient reviewing hospital notes,  test results, labs and examining the patient as well as establishing an assessment and plan that was discussed personally with the patient.  > 50% of time was spent in direct patient care. ?  ?  ? ? ?Antony Contras, MD ?Medical Director ?[facility] ?Pager: [phone] ?10/15/2021 4:33 PM ? ?To contact Stroke Continuity provider, please refer to [email or web address] ?After hours, contact General Neurology ? ?

## 2021-10-15 NOTE — Progress Notes (Addendum)
?PROGRESS NOTE/consult ? ? ? ?Steven Barnett  QAS:[id number] DOB: 06-Jul-1950 DOA: 10/09/2021 ?PCP: Elisabeth Cara, PA-C  ? ? ?Chief Complaint  ?Patient presents with  ? Altered Mental Status  ? ? ?Brief Narrative:  ?HPI per Dr. Sidney Ace ?Steven Barnett is a 71 y.o. male with medical history significant for Afib on coumadin, colon cancer, Diastolic CHF, HTN, OSA, came in to [facility] for evaluation of AMS. ?Last known well presumably 12 PM when he felt acutely weak.  Family took him to lunch but he did not seem to be as interactive as his normal self.  He then became incontinent of urine and had some shaking of his left arm.  Patient unable to provide reliable history.  Brought into [facility] where he was seen by telemedicine neurology.  Hypertensive with blood pressures in the 160s.  CT head with acute small intraparenchymal hematoma within the left temporal occipital junction just inferior to the atrium and occipital horn of the left lateral ventricle.  No frank IVH.  CTA head and neck negative for underlying vascular malformation.  While a neurologist was examining the patient he had a witnessed complex partial seizure involving the right hemibody and rightward gaze deviation. He was given benzos and loaded with Keppra.  Coumadin was reversed ?Transferred to [facility] for further management. ? ?The patient continued to have tachycardia and developed fever the evening of 10/12/2021, [facility] hospitalist was asked to consult with the patient for medical management.  He denied any chest pain or palpitations.  No nausea or vomiting or abdominal pain.  No significant cough or wheezing or dyspnea.  No urinary frequency or urgency, dysuria or flank pain.  No sore throat or earache or rhinorrhea or nasal congestion.  He denies any headache or dizziness or blurred vision. ? ?His temperature was 102.7 with BP of 144/90 and respiratory rate of 25 with heart rate 128 and pulse currently was 97% on room air.  Labs  revealed a sodium of 133 with a CO2 of 21 with otherwise unremarkable BMP.  CBC showed leukocytosis of 15.6 with platelets of 128 and mild microcytosis. ?EKG as reviewed by me : His EKG showed atrial fibrillation with rapid ventricular response of 120 with Q waves anteroseptally and T wave inversion laterally. ?Imaging: Portable chest x-ray showed right basilar atelectatic changes and right hilar prominence likely presenting patient rotation.   ? ? ?Assessment & Plan: ? Active Problems: ?  SIRS (systemic inflammatory response syndrome) (HCC) ?  Atrial fibrillation with rapid ventricular response ([place]) ?  ICH (intracerebral hemorrhage) ([place]) ?  Morbid obesity ([place]) ?  Hyponatremia ?  Gout ?  Hypertensive emergency ?  Seizure ([place]) ? ? ? ?Assessment and Plan: ?SIRS (systemic inflammatory response syndrome) (HCC) ?- The patient has a fever of unknown etiology.??  Secondary to gout flare. ?- We will need to rule out bacteremia and sepsis at this time.  Differential diagnoses include atelectasis. ?- Patient pancultured and placed empirically on IV vancomycin, IV Flagyl, IV cefepime pending culture results as patient noted to have a leukocytosis. ?-Procalcitonin noted at 3.18. ?- His urinalysis came back unremarkable except for 100 protein. ?- Given abnormal portable chest x-ray 2 view was ordered but not done.   ?-Patient placed on incentive spirometry, flutter valve.   ?-MRSA PCR negative.   ?-Patient also placed on steroids due to concern for acute gouty flare.   ?-Currently afebrile x 48 hours.   ?-Discontinued IV vancomycin.   ?-Changed  Flagyl to oral Flagyl.   ?-Discontinue IV cefepime and placed on oral Levaquin for 3 more days to complete an empiric 5-day course of treatment. ?-Follow. ? ?Atrial fibrillation with rapid ventricular response ([place]) ?-This has continued despite fever control. ?- She was therefore given 5 mg of IV Lopressor on 10/13/2021. ?-Heart rate improved. ?-Norvasc discontinued and patient  currently on Cardizem CD 120 mg daily which we will continue. ?-Patient did not need a Cardizem drip. ?- Continue further management of SIRS and potential sepsis. ?-Due to ICH, Coumadin discontinued. ?-We will defer resumption of anticoagulation to primary team, neurology versus starting patient on aspirin 81 mg daily in 1 to 2 weeks however will defer to primary team of neurology ? ? ?ICH (intracerebral hemorrhage) ([place]) ?- Management per primary service, neurology. ?-  Coumadin was held off given his ICH. ? ?Seizure ([place]) ?- Currently stable. ?-No seizures noted. ?-Continue Keppra. ?-Per primary team, neurology. ? ?Hypertensive emergency ?- Initially required cleviprex drip which has subsequently been discontinued. ?-Continue Cardizem. ?-Blood pressure systolic in the 672C. ?-Further management per primary team, neurology. ? ?Gout ?- Patient noted with history of gout as noted to be on colchicine prior to admission  ?-Patient noted on 10/12/2021 to spike fevers, be tachycardic.  -On assessment patient with complaints of diffuse bilateral knee bilateral ankle and left great toe pain to the point he was having difficulty ambulating.   ?-Patient given IV Solu-Medrol x1 on 10/13/2021, placed on oral prednisone x5 days with clinical improvement.   ?-Once patient has completed treatment for acute flare may benefit from being placed on allopurinol.   ?-Outpatient follow-up with PCP. ? ?Hyponatremia ?- This may be hypovolemic. ?-Improved with hydration. ? ?Morbid obesity ([place]) ?- Lifestyle modification. ?-Outpatient follow-up with PCP ? ? ? ? ?  ? ? ?DVT prophylaxis: Lovenox ?Code Status: Full ?Family Communication: Updated patient.  No family at bedside. ?Disposition: Likely SNF hopefully tomorrow.  Patient stable from medical perspective for discharge in the next 24 hours. ? ?Status is: Inpatient ?Remains inpatient appropriate because: Severity of illness. ?  ?Consultants:  ?[facility] hospitalist Dr. Sidney Ace  10/13/2021 ? ?Procedures:  ?CT head without contrast 10/13/2021, 10/09/2021 ?CT angio head and neck 10/09/2021 ? ?Chest x-ray 10/13/2021 ?MRI brain 10/10/2021 ?2D echo 10/10/2021 ? ? ? ? ?Antimicrobials:  ?IV cefepime 10/13/2021>>> 10/15/2021 ?IV Flagyl 10/13/2021>>>> oral Flagyl 10/14/2021>>> ?IV vancomycin 10/13/2021>>>> 10/15/2021 ?Levaquin 10/15/2021>>>> 10/18/2021 ? ? ? ?Subjective: ?Patient laying in bed.  Overall states feels much better.  Improvement with knee and ankle pain.  No chest pain.  No shortness of breath.  No abdominal pain.  Tolerating current diet.  ? ?Objective: ?Vitals:  ? 10/15/21 0400 10/15/21 0749 10/15/21 1105 10/15/21 1507  ?BP: 136/86 (!) 141/91 127/80 139/78  ?Pulse: 75 76 70 74  ?Resp:  17 20   ?Temp: 98.4 ?F (36.9 ?C) 97.7 ?F (36.5 ?C) 97.6 ?F (36.4 ?C) 97.6 ?F (36.4 ?C)  ?TempSrc: Oral Oral Oral Oral  ?SpO2: 100% 97% 99% 96%  ? ? ?Intake/Output Summary (Last 24 hours) at 10/15/2021 1539 ?Last data filed at 10/15/2021 1107 ?Gross per 24 hour  ?Intake 2624.34 ml  ?Output 1900 ml  ?Net 724.34 ml  ? ?There were no vitals filed for this visit. ? ?Examination: ? ?General exam: NAD ?Respiratory system: CTA B.  No wheezes, no crackles, no rhonchi.  Fair air movement.  Speaking in full sentences. ?Cardiovascular system: Regular rate rhythm no murmurs rubs or gallops.  No JVD.  No lower extremity edema. ?  Gastrointestinal system: Abdomen is nondistended, soft and nontender. No organomegaly or masses felt. Normal bowel sounds heard. ?Central nervous system: Alert and oriented. No focal neurological deficits. ?Extremities: Bilateral knees, ankles, left great toe with decreased tenderness to palpation. ?Skin: No rashes, lesions or ulcers ?Psychiatry: Judgement and insight appear normal. Mood & affect appropriate.  ? ? ? ?Data Reviewed:  ? ?CBC: ?Recent Labs  ?Lab 10/09/21 ?1646 10/10/21 ?0434 10/11/21 ?0228 10/13/21 ?0329 10/13/21 ?0725 10/14/21 ?0246 10/15/21 ?0311  ?WBC 6.4   < > 7.6 15.6* 16.7* 14.9* 14.8*   ?NEUTROABS 5.4  --   --   --  14.6* 14.0* 13.3*  ?HGB 15.4   < > 14.7 15.1 14.7 14.6 12.7*  ?HCT 47.4   < > 45.0 45.0 45.0 45.4 38.5*  ?MCV 83.3   < > 80.8 79.4* 80.4 81.4 80.4  ?PLT 117*   < > 125* 128* 138* 118* 146*  ? < > = valu

## 2021-10-16 DIAGNOSIS — I161 Hypertensive emergency: Secondary | ICD-10-CM | POA: Diagnosis not present

## 2021-10-16 DIAGNOSIS — K5904 Chronic idiopathic constipation: Secondary | ICD-10-CM | POA: Diagnosis not present

## 2021-10-16 DIAGNOSIS — I610 Nontraumatic intracerebral hemorrhage in hemisphere, subcortical: Secondary | ICD-10-CM | POA: Diagnosis not present

## 2021-10-16 DIAGNOSIS — I4891 Unspecified atrial fibrillation: Secondary | ICD-10-CM | POA: Diagnosis not present

## 2021-10-16 DIAGNOSIS — K5900 Constipation, unspecified: Secondary | ICD-10-CM | POA: Diagnosis present

## 2021-10-16 DIAGNOSIS — R569 Unspecified convulsions: Secondary | ICD-10-CM | POA: Diagnosis not present

## 2021-10-16 DIAGNOSIS — M109 Gout, unspecified: Secondary | ICD-10-CM | POA: Diagnosis not present

## 2021-10-16 DIAGNOSIS — E871 Hypo-osmolality and hyponatremia: Secondary | ICD-10-CM | POA: Diagnosis not present

## 2021-10-16 LAB — BASIC METABOLIC PANEL
Anion gap: 5 (ref 5–15)
BUN: 36 mg/dL — ABNORMAL HIGH (ref 8–23)
CO2: 20 mmol/L — ABNORMAL LOW (ref 22–32)
Calcium: 8.6 mg/dL — ABNORMAL LOW (ref 8.9–10.3)
Chloride: 113 mmol/L — ABNORMAL HIGH (ref 98–111)
Creatinine, Ser: 1.04 mg/dL (ref 0.61–1.24)
GFR, Estimated: >60 mL/min (ref >60)
Glucose, Bld: 97 mg/dL (ref 70–99)
Potassium: 4.8 mmol/L (ref 3.5–5.1)
Sodium: 138 mmol/L (ref 135–145)

## 2021-10-16 LAB — GLUCOSE, CAPILLARY
Glucose-Capillary: 89 mg/dL (ref 70–99)
Glucose-Capillary: 148 mg/dL — ABNORMAL HIGH (ref 70–99)
Glucose-Capillary: 111 mg/dL — ABNORMAL HIGH (ref 70–99)
Glucose-Capillary: 135 mg/dL — ABNORMAL HIGH (ref 70–99)

## 2021-10-16 LAB — URINE CULTURE: Culture: 20000 — AB

## 2021-10-16 LAB — CBC
HCT: 37.3 % — ABNORMAL LOW (ref 39.0–52.0)
Hemoglobin: 12.3 g/dL — ABNORMAL LOW (ref 13.0–17.0)
MCH: 26.5 pg (ref 26.0–34.0)
MCHC: 33 g/dL (ref 30.0–36.0)
MCV: 80.4 fL (ref 80.0–100.0)
Platelets: 150 10*3/uL (ref 150–400)
RBC: 4.64 MIL/uL (ref 4.22–5.81)
RDW: 16 % — ABNORMAL HIGH (ref 11.5–15.5)
WBC: 9.5 10*3/uL (ref 4.0–10.5)
nRBC: 0.2 % (ref 0.0–0.2)

## 2021-10-16 MED ORDER — POLYETHYLENE GLYCOL 3350 17 G PO PACK
17.0000 g | PACK | Freq: Every day | ORAL | Status: DC
  Administered 2021-10-16: 17 g via ORAL
  Filled 2021-10-16 (×3): qty 1

## 2021-10-16 MED ORDER — PANTOPRAZOLE SODIUM 40 MG PO TBEC: 40.0000 mg | DELAYED_RELEASE_TABLET | Freq: Every day | ORAL | Status: DC

## 2021-10-16 MED ORDER — SORBITOL 70 % SOLN
30.0000 mL | Status: AC
  Administered 2021-10-16: 30 mL via ORAL
  Filled 2021-10-16 (×3): qty 30

## 2021-10-16 MED ORDER — FUROSEMIDE 40 MG PO TABS
40.0000 mg | ORAL_TABLET | Freq: Every day | ORAL | Status: DC
  Administered 2021-10-16 – 2021-10-18 (×3): 40 mg via ORAL
  Filled 2021-10-16 (×3): qty 1

## 2021-10-16 NOTE — Assessment & Plan Note (Addendum)
-  Stated had significant results with bowel regimen of MiraLAX, Senokot and 2 doses of sorbitol.  ?-Patient maintained on MiraLAX and Senokot-S.  ?-Outpatient follow-up.  ?

## 2021-10-16 NOTE — Progress Notes (Signed)
?PROGRESS NOTE/consult ? ? ? ?Steven Barnett  KGU:[id number] DOB: [birth year] DOA: 10/09/2021 ?PCP: Steven Cara, PA-C  ? ? ?Chief Complaint  ?Patient presents with  ? Altered Mental Status  ? ? ?Brief Narrative:  ?HPI per Dr. Sidney Ace ?Steven Barnett is a 71 y.o. male with medical history significant for Afib on coumadin, colon cancer, Diastolic CHF, HTN, OSA, came in to [facility] for evaluation of AMS. ?Last known well presumably 12 PM when he felt acutely weak.  Family took him to lunch but he did not seem to be as interactive as his normal self.  He then became incontinent of urine and had some shaking of his left arm.  Patient unable to provide reliable history.  Brought into [facility] where he was seen by telemedicine neurology.  Hypertensive with blood pressures in the 160s.  CT head with acute small intraparenchymal hematoma within the left temporal occipital junction just inferior to the atrium and occipital horn of the left lateral ventricle.  No frank IVH.  CTA head and neck negative for underlying vascular malformation.  While a neurologist was examining the patient he had a witnessed complex partial seizure involving the right hemibody and rightward gaze deviation. He was given benzos and loaded with Keppra.  Coumadin was reversed ?Transferred to [facility] for further management. ? ?The patient continued to have tachycardia and developed fever the evening of 10/12/2021, [facility] hospitalist was asked to consult with the patient for medical management.  He denied any chest pain or palpitations.  No nausea or vomiting or abdominal pain.  No significant cough or wheezing or dyspnea.  No urinary frequency or urgency, dysuria or flank pain.  No sore throat or earache or rhinorrhea or nasal congestion.  He denies any headache or dizziness or blurred vision. ? ?His temperature was 102.7 with BP of 144/90 and respiratory rate of 25 with heart rate 128 and pulse currently was 97% on room air.  Labs  revealed a sodium of 133 with a CO2 of 21 with otherwise unremarkable BMP.  CBC showed leukocytosis of 15.6 with platelets of 128 and mild microcytosis. ?EKG as reviewed by me : His EKG showed atrial fibrillation with rapid ventricular response of 120 with Q waves anteroseptally and T wave inversion laterally. ?Imaging: Portable chest x-ray showed right basilar atelectatic changes and right hilar prominence likely presenting patient rotation.   ? ? ?Assessment & Plan: ? Active Problems: ?  SIRS (systemic inflammatory response syndrome) (HCC) ?  Atrial fibrillation with rapid ventricular response ([place]) ?  ICH (intracerebral hemorrhage) ([place]) ?  Morbid obesity ([place]) ?  Hyponatremia ?  Gout ?  Hypertensive emergency ?  Seizure ([place]) ?  Constipation ? ? ? ?Assessment and Plan: ?SIRS (systemic inflammatory response syndrome) (HCC) ?- The patient has a fever of unknown etiology.??  Likely secondary to gout flare. ?- We will need to rule out bacteremia and sepsis at this time.  Differential diagnoses include atelectasis. ?- Patient pancultured and placed empirically on IV vancomycin, IV Flagyl, IV cefepime pending culture results as patient noted to have a leukocytosis. ?-Procalcitonin noted at 3.18. ?- His urinalysis came back unremarkable except for 100 protein. ?-Urine culture with 20,000 colonies of E. coli which is pansensitive. ?-Blood cultures with no growth x3 days. ?- Given abnormal portable chest x-ray 2 view was ordered but not done.   ?-Patient placed on incentive spirometry, flutter valve.   ?-MRSA PCR negative.   ?-Patient also placed on steroids  due to concern for acute gouty flare.   ?-Currently afebrile x 72 hours.   ?-Discontinued IV vancomycin.   ?-Changed Flagyl to oral Flagyl.   ?-Discontinue IV cefepime and placed on oral Levaquin to complete an empiric 5-day course of treatment. ?-Follow. ? ?Atrial fibrillation with rapid ventricular response ([place]) ?-This has continued despite fever control. ?- She  was therefore given 5 mg of IV Lopressor on 10/13/2021. ?-Heart rate improved. ?-Norvasc discontinued and patient currently on Cardizem CD 120 mg daily which we will continue. ?-Patient did not need a Cardizem drip. ?- Continue further management of SIRS and potential sepsis. ?-Due to ICH, Coumadin discontinued. ?-We will defer resumption of anticoagulation to primary team, neurology versus starting patient on aspirin 81 mg daily in 1 to 2 weeks however will defer to primary team of neurology ? ? ?ICH (intracerebral hemorrhage) ([place]) ?- Management per primary service, neurology. ?-  Coumadin was held off given his ICH. ? ?Constipation ?- Start MiraLAX 17 g daily, sorbitol every 2 to 3 hours x 2 doses. ?-Continue Senokot-S. ?-If no results will try Dulcolax suppository and an enema. ? ?Seizure ([place]) ?- Currently stable. ?-No seizures noted. ?-Continue Keppra. ?-Per primary team, neurology. ? ?Hypertensive emergency ?- Initially required cleviprex drip which has subsequently been discontinued. ?-Continue Cardizem. ?-Blood pressure systolic in the 836O. ?-Further management per primary team, neurology. ? ?Gout ?- Patient noted with history of gout as noted to be on colchicine prior to admission  ?-Patient noted on 10/12/2021 to spike fevers, be tachycardic.  -On assessment patient with complaints of diffuse bilateral knee bilateral ankle and left great toe pain to the point he was having difficulty ambulating.   ?-Patient given IV Solu-Medrol x1 on 10/13/2021, placed on oral prednisone x5 days with clinical improvement.   ?-Once patient has completed treatment for acute flare may benefit from being placed on allopurinol.   ?-Outpatient follow-up with PCP. ? ?Hyponatremia ?- This may be hypovolemic. ?-Improved with hydration. ? ?Morbid obesity ([place]) ?- Lifestyle modification. ?-Outpatient follow-up with PCP ? ? ? ? ?  ? ? ?DVT prophylaxis: Lovenox ?Code Status: Full ?Family Communication: Updated patient.  No family at  bedside. ?Disposition: Likely SNF hopefully tomorrow.  Patient stable from medical perspective for discharge in the next 24 hours. ? ?Status is: Inpatient ?Remains inpatient appropriate because: Severity of illness. ?  ?Consultants:  ?[facility] hospitalist Dr. Sidney Ace 10/13/2021 ? ?Procedures:  ?CT head without contrast 10/13/2021, 10/09/2021 ?CT angio head and neck 10/09/2021 ? ?Chest x-ray 10/13/2021 ?MRI brain 10/10/2021 ?2D echo 10/10/2021 ? ? ? ? ?Antimicrobials:  ?IV cefepime 10/13/2021>>> 10/15/2021 ?IV Flagyl 10/13/2021>>>> oral Flagyl 10/14/2021>>> ?IV vancomycin 10/13/2021>>>> 10/15/2021 ?Levaquin 10/15/2021>>>> 10/18/2021 ? ? ? ?Subjective: ?Patient sitting up in chair.  No chest pain.  No shortness of breath.  No abdominal pain.  Stated just received a laxative for constipation. ? ?Objective: ?Vitals:  ? 10/16/21 0341 10/16/21 0740 10/16/21 1110 10/16/21 1531  ?BP: (!) 149/95 (!) 143/99 (!) 157/98 (!) 158/99  ?Pulse: 66 76 82 60  ?Resp: 18  20   ?Temp: (!) 97.5 ?F (36.4 ?C) 98 ?F (36.7 ?C) 98.4 ?F (36.9 ?C) 97.8 ?F (36.6 ?C)  ?TempSrc: Oral Oral Oral Oral  ?SpO2: 97% 98% 97% 100%  ? ? ?Intake/Output Summary (Last 24 hours) at 10/16/2021 1537 ?Last data filed at 10/16/2021 1318 ?Gross per 24 hour  ?Intake 957.61 ml  ?Output 1875 ml  ?Net -917.39 ml  ? ?There were no vitals filed for this visit. ? ?Examination: ? ?  General exam: NAD ?Respiratory system: Lungs clear to auscultation bilaterally.  No wheezes, no crackles, no rhonchi.  Fair air movement.  Speaking in full sentences.  ?Cardiovascular system: RRR no murmurs rubs or gallops.  No JVD.  No lower extremity edema. ?Gastrointestinal system: Abdomen is soft, nontender, nondistended, positive bowel sounds.  No rebound.  No guarding. ?Central nervous system: Alert and oriented. No focal neurological deficits. ?Extremities: Bilateral knees, ankles, left great toe with significantly decreased tenderness to palpation.  No warmth. ?Skin: No rashes, lesions or ulcers ?Psychiatry:  Judgement and insight appear normal. Mood & affect appropriate.  ? ? ? ?Data Reviewed:  ? ?CBC: ?Recent Labs  ?Lab 10/09/21 ?1646 10/10/21 ?0434 10/13/21 ?0329 10/13/21 ?0725 10/14/21 ?0246 10/15/21 ?6599 04/1

## 2021-10-16 NOTE — Progress Notes (Addendum)
STROKE TEAM PROGRESS NOTE  ? ?INTERVAL HISTORY ?No family is at the bedside.  Patient sitting in chair, just had a bowel movement, talking with family over the phone.  He still complaining of left toe gout flare, currently on colchicine and prednisone.  Also felt left elbow some painful feeling, concerning for gout flare on the left elbow too.  PT recommends SNF. ? ?Vitals:  ? 10/15/21 2357 10/16/21 0341 10/16/21 0740 10/16/21 1110  ?BP: (!) 159/86 (!) 149/95 (!) 143/99 (!) 157/98  ?Pulse: 83 66 76 82  ?Resp:  18  20  ?Temp: 97.7 ?F (36.5 ?C) (!) 97.5 ?F (36.4 ?C) 98 ?F (36.7 ?C) 98.4 ?F (36.9 ?C)  ?TempSrc: Oral Oral Oral Oral  ?SpO2: 98% 97% 98% 97%  ? ?CBC:  ?Recent Labs  ?Lab 10/14/21 ?0246 10/15/21 ?0867 10/16/21 ?0153  ?WBC 14.9* 14.8* 9.5  ?NEUTROABS 14.0* 13.3*  --   ?HGB 14.6 12.7* 12.3*  ?HCT 45.4 38.5* 37.3*  ?MCV 81.4 80.4 80.4  ?PLT 118* 146* 150  ? ?Basic Metabolic Panel:  ?Recent Labs  ?Lab 10/15/21 ?6195 10/16/21 ?0153  ?NA 133* 138  ?K 4.4 4.8  ?CL 108 113*  ?CO2 20* 20*  ?GLUCOSE 115* 97  ?BUN 41* 36*  ?CREATININE 1.12 1.04  ?CALCIUM 8.6* 8.6*  ?MG 2.3  --   ? ?Lipid Panel:  ?Recent Labs  ?Lab 10/10/21 ?0932  ?CHOL 125  ?TRIG 86  ?HDL 46  ?CHOLHDL 2.7  ?VLDL 17  ?[place] 62  ? ?HgbA1c:  ?Recent Labs  ?Lab 10/10/21 ?6712  ?HGBA1C 5.0  ? ?Urine Drug Screen:  ?Recent Labs  ?Lab 10/09/21 ?1727  ?LABOPIA NONE DETECTED  ?COCAINSCRNUR NONE DETECTED  ?LABBENZ NONE DETECTED  ?AMPHETMU NONE DETECTED  ?THCU NONE DETECTED  ?LABBARB NONE DETECTED  ?  ?Alcohol Level  ?Recent Labs  ?Lab 10/09/21 ?1646  ?ETH <10  ? ? ?IMAGING past 24 hours ?No results found. ? ?PHYSICAL EXAM ? ?Physical Exam  ?Constitutional: Appears well-developed and well-nourished.  Middle-aged African-American male ?Psych: Affect appropriate to situation ?Cardiovascular: irregular rate and rhythm.  ?Respiratory: Effort normal, non-labored breathing ? ?Neuro: ?Mental Status: ?As above ?Cranial Nerves: ?II: Visual Fields are full. Pupils are  round, and reactive to light. R pupil 0.5 mm larger than L.  ?III,IV, VI: EOMI without ptosis or diploplia.  ?V: Facial sensation is symmetric to temperature ?VII: Facial movement is symmetric resting and smiling ?VIII: Hearing is intact to voice ?X: Palate elevates symmetrically ?XI: Shoulder shrug is symmetric. ?XII: Tongue protrudes midline without atrophy or fasciculations.  ?Motor: ?Tone is normal. Bulk is normal. 5/5 strength in all extremities. ?Sensory: ?Sensation is symmetric to light touch and temperature in the arms and legs. ?Cerebellar: ?FNF intact ? ? ? ?ASSESSMENT/PLAN ?Steven Barnett is a 71 y.o. male PMH of Afib on coumadin, colon cancer, Diastolic CHF, HTN, OSA, came in to [facility] for evaluation of AMS. CT head with acute small intraparenchymal hematoma within the left temporal occipital junction just inferior to the atrium and occipital horn of the left lateral ventricle. CTA head and neck negative for underlying vascular malformation. Coumadin reversed. Had a witnessed GTC event with R gaze deviation for which he was Keppra loaded and remains on 500 mg BID.   ? ?ICH - Subcortical ICH secondary to HTN and warfarin use ?Code Stroke acute small ICH within the left temporal occipital junction just inferior to the atrium and occipital horn of the left lateral ventricle ?CTA head & neck: negative for  underlying vascular malformation ?MRI  stable hematoma  ?2D Echo 60-65% EF, no cardiac source of embolism ?EEG negative for seizure ?LDL 62 ?HgbA1c 5.0 ?VTE prophylaxis - Lovenox ?aspirin 81 mg daily and warfarin 4 mg daily prior to admission, now on ASA 81 mg (started 4/12) ?Therapy recommendations:  SNF  ?Disposition:  pending ? ?Afib on coumadin ?INR 2.0 on admission  ?s/p VitK and Kcentra ?Now on ASA 81 and cadizem ?Consider outpt eliquis or ASPIRE trial at follow up in clinic ? ?Hypertension ?Home meds: NA ?stable now ?BP goal < 160 ?Long-term BP goal normotensive ? ?Lipid managment ?Home meds: NA ?LDL  62, goal < 70 ?No statin indicated given  LDL at goal ? ?Other Stroke Risk Factors ?Advanced Age >/= 18  ?ETOH use, alcohol level <10, advised to drink no more than 1 drink(s) a day ?Obesity ?Obstructive sleep apnea, on CPAP at home ?Congestive heart failure ? ?Other Active Problems: ?Gout - on colchicine and prednisone  ?Leukocytosis WBC 14.8-9.5 ?Constipation - bowel regimen ? ? ?Hospital day # 7 ? ?Rosalin Hawking, MD PhD ?Stroke Neurology ?10/16/2021 1:37 PM ? ?To contact Stroke Continuity provider, please refer to [email or web address] ?After hours, contact General Neurology ? ?

## 2021-10-16 NOTE — Progress Notes (Signed)
Physical Therapy Treatment ?Patient Details ?Name: Steven Barnett ?MRN: [id number] ?DOB: 04/18/1951 ?Today's Date: 10/16/2021 ? ? ?History of Present Illness 71 y.o. male presents to [facility] as a transfer from [place] on 10/09/2021 with AMS, incontinence and weakness. 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 Pt experienced a seizure when at [place]. PMH includes Afib on coumadin, colon cancer, Diastolic CHF, HTN, OSA.  Pt. with significant B LE pain secondary to gout and code sepsis 4/12. ? ?  ?PT Comments  ? ? Pt admitted with above diagnosis. Pt continues to need  cues for redirection to tasks as he is distracted easily and impulsive at times. Pt min assist for gait with mod cuing for safety and need for chair follow as pt fatigues and needed to sit once in hallway.  Pt reports his daughter does work and isnt always home but there are other people that may could help him.  Will check back to see if these other people can come in for family teaching Monday to ensure that pt has the help he will need at home. At current level, SNF still best option. Pt currently with functional limitations due to balance and endurance deficits. Pt will benefit from skilled PT to increase their independence and safety with mobility to allow discharge to the venue listed below.      ?Recommendations for follow up therapy are one component of a multi-disciplinary discharge planning process, led by the attending physician.  Recommendations may be updated based on patient status, additional functional criteria and insurance authorization. ? ?Follow Up Recommendations ? Skilled nursing-short term rehab (<3 hours/day) ?  ?  ?Assistance Recommended at Discharge Frequent or constant Supervision/Assistance  ?Patient can return home with the following Assistance with cooking/housework;Direct supervision/assist for medications management;Direct supervision/assist for financial management;Assist for transportation;Help with  stairs or ramp for entrance;A lot of help with bathing/dressing/bathroom;A lot of help with walking and/or transfers ?  ?Equipment Recommendations ? None recommended by PT  ?  ?Recommendations for Other Services   ? ? ?  ?Precautions / Restrictions Precautions ?Precautions: Fall ?Precaution Comments: Gout flare ?Restrictions ?Weight Bearing Restrictions: No  ?  ? ?Mobility ? Bed Mobility ?Overal bed mobility: Needs Assistance ?Bed Mobility: Supine to Sit ?  ?  ?Supine to sit: Mod assist ?  ?  ?General bed mobility comments: Mod assist to come to eOB ?  ? ?Transfers ?Overall transfer level: Needs assistance ?Equipment used: Rolling walker (2 wheels) ?Transfers: Bed to chair/wheelchair/BSC ?Sit to Stand: Mod assist, +2 safety/equipment, From elevated surface ?  ?  ?  ?  ?  ?General transfer comment: Pt. mod A for power up and steady, mod A for posterior LOB ?  ? ?Ambulation/Gait ?Ambulation/Gait assistance: +2 safety/equipment, Min assist, mod assist ?Gait Distance (Feet): 100 Feet ?Assistive device: Rolling walker (2 wheels) ?Gait Pattern/deviations: Step-to pattern, Decreased stride length, Wide base of support, Trunk flexed, Decreased weight shift to left, Antalgic, Leaning posteriorly ?Gait velocity: decreased ?Gait velocity interpretation: <1.31 ft/sec, indicative of household ambulator ?  ?General Gait Details: Complains of legs feeling stiff, requires constant directional cuing for where he needs to step and support for balance.Followed with chair with pt needing to sit in hallway. ? ? ?Stairs ?  ?  ?  ?  ?  ? ? ?Wheelchair Mobility ?  ? ?Modified Rankin (Stroke Patients Only) ?Modified Rankin (Stroke Patients Only) ?Pre-Morbid Rankin Score: No significant disability ?Modified Rankin: Moderately severe disability ? ? ?  ?  Balance Overall balance assessment: Independent ?Sitting-balance support: Feet supported ?Sitting balance-Leahy Scale: Fair ?  ?Postural control: Posterior lean ?Standing balance support:  Bilateral upper extremity supported ?Standing balance-Leahy Scale: Poor ?Standing balance comment: Requires RW and assist to stand and steady. Requires support for all dynamic movement ?  ?  ?  ?  ?  ?  ?  ?  ?  ?  ?  ?  ? ?  ?Cognition Arousal/Alertness: Awake/alert ?Behavior During Therapy: [facility] for tasks assessed/performed ?Overall Cognitive Status: Impaired/Different from baseline ?Area of Impairment: Safety/judgement, Memory, Following commands, Awareness, Problem solving ?  ?  ?  ?  ?  ?  ?  ?  ?Orientation Level: Situation ?  ?Memory: Decreased short-term memory ?Following Commands: Follows one step commands consistently, Follows multi-step commands inconsistently ?Safety/Judgement: Decreased awareness of deficits ?Awareness: Emergent ?Problem Solving: Slow processing, Decreased initiation ?General Comments: Pt. tangential throughout session and poor historian about home support level. ?  ?  ? ?  ?Exercises General Exercises - Lower Extremity ?Long Arc Quad: AROM, Both, 10 reps, Seated ?Hip Flexion/Marching: AROM, Both, 10 reps, Seated ?Other Exercises ?Other Exercises: ` ? ?  ?General Comments   ?  ?  ? ?Pertinent Vitals/Pain Pain Assessment ?Pain Assessment: Faces ?Faces Pain Scale: Hurts little more ?Breathing: normal ?Negative Vocalization: none ?Facial Expression: smiling or inexpressive ?Body Language: relaxed ?Consolability: no need to console ?PAINAD Score: 0 ?Pain Location: left LE ?Pain Descriptors / Indicators: Aching, Sore ?Pain Intervention(s): Limited activity within patient's tolerance, Monitored during session, Repositioned, Premedicated before session  ? ? ?Home Living   ?  ?  ?  ?  ?  ?  ?  ?  ?  ?   ?  ?Prior Function    ?  ?  ?   ? ?PT Goals (current goals can now be found in the care plan section) Acute Rehab PT Goals ?Patient Stated Goal: to return to independence ?Progress towards PT goals: Progressing toward goals ? ?  ?Frequency ? ? ? Min 3X/week ? ? ? ?  ?PT Plan Current plan remains  appropriate  ? ? ?Co-evaluation   ?  ?  ?  ?  ? ?  ?AM-PAC PT "6 Clicks" Mobility   ?Outcome Measure ? Help needed turning from your back to your side while in a flat bed without using bedrails?: A Lot ?Help needed moving from lying on your back to sitting on the side of a flat bed without using bedrails?: A Lot ?Help needed moving to and from a bed to a chair (including a wheelchair)?: Total ?Help needed standing up from a chair using your arms (e.g., wheelchair or bedside chair)?: A Lot ?Help needed to walk in hospital room?: A Lot ?Help needed climbing 3-5 steps with a railing? : Total ?6 Click Score: 10 ? ?  ?End of Session Equipment Utilized During Treatment: Gait belt ?Activity Tolerance: Patient limited by pain;Patient limited by fatigue ?Patient left: in chair;with call bell/phone within reach;with chair alarm set ?Nurse Communication: Mobility status ?PT Visit Diagnosis: Other abnormalities of gait and mobility (R26.89);Muscle weakness (generalized) (M62.81);Other symptoms and signs involving the nervous system (R29.898);Pain ?Pain - Right/Left:  (Bil) ?Pain - part of body: Knee ?  ? ? ?Time: 2878-6767 ?PT Time Calculation (min) (ACUTE ONLY): 33 min ? ?Charges:  $Gait Training: 8-22 mins ?$Therapeutic Exercise: 8-22 mins          ?          ? ?Raygen Dahm M,PT ?Acute  Rehab Services ?[phone] ?[phone] (pager)  ? ? ?Rushton Early F Gage Treiber ?10/16/2021, 12:45 PM ? ?

## 2021-10-16 NOTE — Plan of Care (Signed)
?  Problem: Education: ?Goal: Knowledge of disease or condition will improve ?Outcome: Progressing ?Goal: Knowledge of secondary prevention will improve (SELECT ALL) ?Outcome: Progressing ?Goal: Knowledge of patient specific risk factors will improve (INDIVIDUALIZE FOR PATIENT) ?Outcome: Progressing ?Goal: Individualized Educational Video(s) ?Outcome: Progressing ?  ?Problem: Health Behavior/Discharge Planning: ?Goal: Ability to manage health-related needs will improve ?Outcome: Progressing ?  ?Problem: Intracerebral Hemorrhage Tissue Perfusion: ?Goal: Complications of Intracerebral Hemorrhage will be minimized ?Outcome: Progressing ?  ?Problem: Health Behavior/Discharge Planning: ?Goal: Ability to manage health-related needs will improve ?Outcome: Progressing ?  ?Problem: Clinical Measurements: ?Goal: Ability to maintain clinical measurements within normal limits will improve ?Outcome: Progressing ?Goal: Will remain free from infection ?Outcome: Progressing ?Goal: Diagnostic test results will improve ?Outcome: Progressing ?Goal: Respiratory complications will improve ?Outcome: Progressing ?Goal: Cardiovascular complication will be avoided ?Outcome: Progressing ?  ?Problem: Activity: ?Goal: Risk for activity intolerance will decrease ?Outcome: Progressing ?  ?Problem: Nutrition: ?Goal: Adequate nutrition will be maintained ?Outcome: Progressing ?  ?Problem: Coping: ?Goal: Level of anxiety will decrease ?Outcome: Progressing ?  ?Problem: Elimination: ?Goal: Will not experience complications related to bowel motility ?Outcome: Progressing ?Goal: Will not experience complications related to urinary retention ?Outcome: Progressing ?  ?Problem: Pain Managment: ?Goal: General experience of comfort will improve ?Outcome: Progressing ?  ?Problem: Safety: ?Goal: Ability to remain free from injury will improve ?Outcome: Progressing ?  ?Problem: Skin Integrity: ?Goal: Risk for impaired skin integrity will decrease ?Outcome:  Progressing ?  ?Problem: Education: ?Goal: Knowledge of General Education information will improve ?Description: Including pain rating scale, medication(s)/side effects and non-pharmacologic comfort measures ?Outcome: Progressing ?  ?Problem: Health Behavior/Discharge Planning: ?Goal: Ability to manage health-related needs will improve ?Outcome: Progressing ?  ?Problem: Clinical Measurements: ?Goal: Ability to maintain clinical measurements within normal limits will improve ?Outcome: Progressing ?Goal: Will remain free from infection ?Outcome: Progressing ?Goal: Diagnostic test results will improve ?Outcome: Progressing ?Goal: Respiratory complications will improve ?Outcome: Progressing ?Goal: Cardiovascular complication will be avoided ?Outcome: Progressing ?  ?Problem: Activity: ?Goal: Risk for activity intolerance will decrease ?Outcome: Progressing ?  ?Problem: Nutrition: ?Goal: Adequate nutrition will be maintained ?Outcome: Progressing ?  ?Problem: Coping: ?Goal: Level of anxiety will decrease ?Outcome: Progressing ?  ?Problem: Elimination: ?Goal: Will not experience complications related to bowel motility ?Outcome: Progressing ?Goal: Will not experience complications related to urinary retention ?Outcome: Progressing ?  ?Problem: Pain Managment: ?Goal: General experience of comfort will improve ?Outcome: Progressing ?  ?Problem: Safety: ?Goal: Ability to remain free from injury will improve ?Outcome: Progressing ?  ?Problem: Education: ?Goal: Knowledge of secondary prevention will improve (SELECT ALL) ?Outcome: Progressing ?Goal: Knowledge of patient specific risk factors will improve (INDIVIDUALIZE FOR PATIENT) ?Outcome: Progressing ?  ?

## 2021-10-17 DIAGNOSIS — R569 Unspecified convulsions: Secondary | ICD-10-CM | POA: Diagnosis not present

## 2021-10-17 DIAGNOSIS — I4891 Unspecified atrial fibrillation: Secondary | ICD-10-CM | POA: Diagnosis not present

## 2021-10-17 DIAGNOSIS — K5904 Chronic idiopathic constipation: Secondary | ICD-10-CM | POA: Diagnosis not present

## 2021-10-17 DIAGNOSIS — I161 Hypertensive emergency: Secondary | ICD-10-CM | POA: Diagnosis not present

## 2021-10-17 DIAGNOSIS — E871 Hypo-osmolality and hyponatremia: Secondary | ICD-10-CM | POA: Diagnosis not present

## 2021-10-17 DIAGNOSIS — I610 Nontraumatic intracerebral hemorrhage in hemisphere, subcortical: Secondary | ICD-10-CM | POA: Diagnosis not present

## 2021-10-17 DIAGNOSIS — M109 Gout, unspecified: Secondary | ICD-10-CM | POA: Diagnosis not present

## 2021-10-17 LAB — GLUCOSE, CAPILLARY
Glucose-Capillary: 84 mg/dL (ref 70–99)
Glucose-Capillary: 135 mg/dL — ABNORMAL HIGH (ref 70–99)
Glucose-Capillary: 116 mg/dL — ABNORMAL HIGH (ref 70–99)
Glucose-Capillary: 118 mg/dL — ABNORMAL HIGH (ref 70–99)

## 2021-10-17 LAB — BASIC METABOLIC PANEL
Anion gap: 6 (ref 5–15)
BUN: 27 mg/dL — ABNORMAL HIGH (ref 8–23)
CO2: 21 mmol/L — ABNORMAL LOW (ref 22–32)
Calcium: 8.5 mg/dL — ABNORMAL LOW (ref 8.9–10.3)
Chloride: 107 mmol/L (ref 98–111)
Creatinine, Ser: 0.95 mg/dL (ref 0.61–1.24)
GFR, Estimated: >60 mL/min (ref >60)
Glucose, Bld: 88 mg/dL (ref 70–99)
Potassium: 4.5 mmol/L (ref 3.5–5.1)
Sodium: 134 mmol/L — ABNORMAL LOW (ref 135–145)

## 2021-10-17 LAB — MAGNESIUM: Magnesium: 2.1 mg/dL (ref 1.7–2.4)

## 2021-10-17 MED ORDER — DILTIAZEM HCL 30 MG PO TABS
30.0000 mg | ORAL_TABLET | Freq: Two times a day (BID) | ORAL | Status: DC
  Administered 2021-10-17 – 2021-10-18 (×3): 30 mg via ORAL
  Filled 2021-10-17 (×3): qty 1

## 2021-10-17 NOTE — Progress Notes (Signed)
HR have been low overnight with at least one sinus pause of 2.6 seconds, currently in 50s. I will hold AM diltiazem, could consider discussing with cardiology in the morning.  ? ?Roland Rack, MD ?[facility] ?[phone] ? ?If 7pm- 7am, please page neurology on call as listed in [facility]. ? ?

## 2021-10-17 NOTE — Progress Notes (Addendum)
STROKE TEAM PROGRESS NOTE  ? ?INTERVAL HISTORY ?No family is at the bedside. Patient seen sitting in chair, in no acute distress. He reports continued improvement in his joint pain. Interested in working with PT.  ? ?Vitals:  ? 10/16/21 2330 10/17/21 0329 10/17/21 0841 10/17/21 1144  ?BP: (!) 144/99 125/79 131/83 132/83  ?Pulse: 64 (!) 50 (!) 48 (!) 51  ?Resp:  19 20   ?Temp: 98.1 ?F (36.7 ?C) 98.1 ?F (36.7 ?C) 98.1 ?F (36.7 ?C) 97.8 ?F (36.6 ?C)  ?TempSrc: Oral Oral Oral Oral  ?SpO2: 98% 95% 98% 97%  ? ?CBC:  ?Recent Labs  ?Lab 10/14/21 ?0246 10/15/21 ?2025 10/16/21 ?0153  ?WBC 14.9* 14.8* 9.5  ?NEUTROABS 14.0* 13.3*  --   ?HGB 14.6 12.7* 12.3*  ?HCT 45.4 38.5* 37.3*  ?MCV 81.4 80.4 80.4  ?PLT 118* 146* 150  ? ?Basic Metabolic Panel:  ?Recent Labs  ?Lab 10/15/21 ?4270 10/16/21 ?0153 10/17/21 ?6237  ?NA 133* 138 134*  ?K 4.4 4.8 4.5  ?CL 108 113* 107  ?CO2 20* 20* 21*  ?GLUCOSE 115* 97 88  ?BUN 41* 36* 27*  ?CREATININE 1.12 1.04 0.95  ?CALCIUM 8.6* 8.6* 8.5*  ?MG 2.3  --  2.1  ? ?Lipid Panel:  ?No results for input(s): CHOL, TRIG, HDL, CHOLHDL, VLDL, LDLCALC in the last 168 hours. ? ?HgbA1c:  ?No results for input(s): HGBA1C in the last 168 hours. ? ?Urine Drug Screen:  ?No results for input(s): LABOPIA, COCAINSCRNUR, LABBENZ, AMPHETMU, THCU, LABBARB in the last 168 hours. ?  ?Alcohol Level  ?No results for input(s): ETH in the last 168 hours. ? ? ?IMAGING past 24 hours ?No results found. ? ?PHYSICAL EXAM ? ?Physical Exam  ?Constitutional: Appears well-developed and well-nourished.  Middle-aged African-American male ?Psych: Affect appropriate to situation ?Cardiovascular: irregular rate and rhythm.  ?Respiratory: Effort normal, non-labored breathing ? ?Neuro: ?Mental Status: ?As above ?Cranial Nerves: ?II: Visual Fields are full. Pupils are equal, round, and reactive to light. ?III,IV, VI: EOMI without ptosis or diploplia.  ?V: Facial sensation is symmetric to temperature ?VII: Facial movement is symmetric resting  and smiling ?VIII: Hearing is intact to voice ?X: Palate elevates symmetrically ?XI: Shoulder shrug is symmetric. ?XII: Tongue protrudes midline without atrophy or fasciculations.  ?Motor: ?Tone is normal. Bulk is normal. 5/5 strength in all extremities. ?Sensory: ?Sensation is symmetric to light touch and temperature in the arms and legs. ?Cerebellar: ?FNF intact ? ? ? ?ASSESSMENT/PLAN ?Steven Barnett is a 71 y.o. male PMH of Afib on coumadin, colon cancer, Diastolic CHF, HTN, OSA, came in to [facility] for evaluation of AMS. CT head with acute small intraparenchymal hematoma within the left temporal occipital junction just inferior to the atrium and occipital horn of the left lateral ventricle. CTA head and neck negative for underlying vascular malformation. Coumadin reversed. Had a witnessed GTC event with R gaze deviation for which he was Keppra loaded and remains on 500 mg BID.   ? 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 ?Afib on coumadin ?INR 2.0 on admission  ?s/p VitK and Kcentra ?Now on ASA 81 and cadizem ?Consider outpt to resume eliquis or ASPIRE trial at follow up in neuro clinic ? ?Hypertension ?Home meds: NA ?stable now ?BP goal < 160 ?Long-term BP goal normotensive ? ?Lipid managment ?Home meds: NA ?LDL 62, goal < 70 ?No statin indicated given  LDL at goal ? ?Other Stroke Risk Factors ?Advanced Age >/= 14  ?ETOH use, alcohol level <10, advised to drink no more than 1 drink(s) a  day ?Obesity ?Obstructive sleep apnea, on CPAP at home ?Congestive heart failure ? ?Other Active Problems: ?Gout - on colchicine and prednisone  ?Leukocytosis WBC 14.8-9.5 ?Constipation - bowel regimen ? ? ?Hospital day # 8 ? ? ?Corky Sox, MD ?PGY-1 ? ?ATTENDING NOTE: ?I reviewed above note and agree with the assessment and plan. Pt was seen and examined.  ? ?Patient sitting in chair, no complaints, no acute event overnight.  Stated that his left big toe and left elbow gout improving, left knee feeling much better.  Neuro stable no significant change.  Pending SNF versus CIR. ? ?For detailed assessment and plan, please refer to above as I have made changes wherever appropriate.  ? ?Neurology will sign off. Please call with questions. Pt will follow up with stroke clinic Dr. Leonie Man at [facility] in about 4 weeks. Thanks for the consult. ? ? ?Rosalin Hawking, MD PhD ?Stroke Neurology ?10/17/2021 ?5:14 PM ? ? ? ?To contact Stroke Continuity provider, please refer to [email or web address] ?After hours, contact General Neurology ? ?

## 2021-10-17 NOTE — Progress Notes (Signed)
?PROGRESS NOTE/consult ? ? ? ?Steven Barnett  XVQ:[id number] DOB: 03-Jun-1951 DOA: 10/09/2021 ?PCP: Elisabeth Cara, PA-C  ? ? ?Chief Complaint  ?Patient presents with  ? Altered Mental Status  ? ? ?Brief Narrative:  ?HPI per Dr. Sidney Ace ?Steven Barnett is a 71 y.o. male with medical history significant for Afib on coumadin, colon cancer, Diastolic CHF, HTN, OSA, came in to [facility] for evaluation of AMS. ?Last known well presumably 12 PM when he felt acutely weak.  Family took him to lunch but he did not seem to be as interactive as his normal self.  He then became incontinent of urine and had some shaking of his left arm.  Patient unable to provide reliable history.  Brought into [facility] where he was seen by telemedicine neurology.  Hypertensive with blood pressures in the 160s.  CT head with acute small intraparenchymal hematoma within the left temporal occipital junction just inferior to the atrium and occipital horn of the left lateral ventricle.  No frank IVH.  CTA head and neck negative for underlying vascular malformation.  While a neurologist was examining the patient he had a witnessed complex partial seizure involving the right hemibody and rightward gaze deviation. He was given benzos and loaded with Keppra.  Coumadin was reversed ?Transferred to [facility] for further management. ? ?The patient continued to have tachycardia and developed fever the evening of 10/12/2021, [facility] hospitalist was asked to consult with the patient for medical management.  He denied any chest pain or palpitations.  No nausea or vomiting or abdominal pain.  No significant cough or wheezing or dyspnea.  No urinary frequency or urgency, dysuria or flank pain.  No sore throat or earache or rhinorrhea or nasal congestion.  He denies any headache or dizziness or blurred vision. ? ?His temperature was 102.7 with BP of 144/90 and respiratory rate of 25 with heart rate 128 and pulse currently was 97% on room air.  Labs  revealed a sodium of 133 with a CO2 of 21 with otherwise unremarkable BMP.  CBC showed leukocytosis of 15.6 with platelets of 128 and mild microcytosis. ?EKG as reviewed by me : His EKG showed atrial fibrillation with rapid ventricular response of 120 with Q waves anteroseptally and T wave inversion laterally. ?Imaging: Portable chest x-ray showed right basilar atelectatic changes and right hilar prominence likely presenting patient rotation.   ? ? ?Assessment & Plan: ? Active Problems: ?  SIRS (systemic inflammatory response syndrome) (HCC) ?  Atrial fibrillation with rapid ventricular response ([place]) ?  ICH (intracerebral hemorrhage) ([place]) ?  Morbid obesity ([place]) ?  Hyponatremia ?  Gout ?  Hypertensive emergency ?  Seizure ([place]) ?  Constipation ? ? ? ?Assessment and Plan: ?SIRS (systemic inflammatory response syndrome) (HCC) ?- The patient has a fever of unknown etiology.??  Likely secondary to gout flare. ?- Patient pancultured with no growth to date in terms of blood cultures.  Differential diagnoses include atelectasis. ?- Urine cultures with 20,000 colonies of E. coli.  ?-Patient was placed empirically on IV vancomycin, IV Flagyl, IV cefepime.  ?-Leukocytosis trending down ?-Patient afebrile.  ?- Patient pancultured and placed empirically on IV vancomycin, IV Flagyl, IV ?- Given abnormal portable chest x-ray 2 view was ordered but not done.   ?-Patient placed on incentive spirometry, flutter valve.   ?-MRSA PCR negative.   ?-Patient also placed on steroids due to concern for acute gouty flare.   ?-Currently afebrile x 4 days.   ?-  Discontinued IV vancomycin.   ?-Changed Flagyl to oral Flagyl.   ?-Discontinue IV cefepime and placed on oral Levaquin to complete an empiric 5-day course of treatment. ?-Patient has completed 5-day course of antibiotic treatment. ?-No further antibiotics needed at this time. ?-Follow. ? ?Atrial fibrillation with rapid ventricular response ([place]) ?-This has continued despite fever  control. ?- She was therefore given 5 mg of IV Lopressor on 10/13/2021. ?-Heart rate improved. ?-Norvasc discontinued and patient currently on Cardizem 30 mg p.o. twice daily.   ?-Patient did not need a Cardizem drip. ?-Patient noted to have some bouts of bradycardia while sleeping and sinus pause of 2.6 seconds however remained asymptomatic. ?-Heart rate improved with exertion and ambulation. ?- Continue further management of SIRS and potential sepsis. ?-Due to ICH, Coumadin discontinued. ?-Continue aspirin 81 mg daily. ? ? ?ICH (intracerebral hemorrhage) ([place]) ?- Code stroke was called on admission due to acute small ICH within the left temporal occipital junction inferior to the atrium and occipital horn of the left lateral ventricle.   ?-CT angio head and neck negative for underlying vascular malformation.   ?-MRI done with stable hematoma.   ?-2D echo with a EF of 60 to 65%, no cardiac source of embolism.   ?-EEG done was negative for any epileptiform activity or seizures.   ?-Fasting lipid panel with LDL of 62.   ?-Hemoglobin A1c 5.0.   ?-  Coumadin was held off given his ICH. ?-Continue aspirin 81 mg daily. ?-PT recommending SNF however patient wants to really go home and ambulated in hallway with walker.  Son feels patient is close to baseline. ?-Per neurology. ? ?Constipation ?-Stated had significant results with bowel regimen yesterday.  ?-Continue MiraLAX and Senokot-S.  ?-Outpatient follow-up.  ? ?Seizure ([place]) ?- Currently stable. ?-No seizures noted. ?-Continue Keppra. ?-Per neurology. ? ?Hypertensive emergency ?- Initially required cleviprex drip which has subsequently been discontinued. ?-Continue Cardizem. ?-Resume home regimen Lasix. ?-Outpatient follow-up with PCP. ? ?Gout ?- Patient noted with history of gout as noted to be on colchicine prior to admission  ?-Patient noted on 10/12/2021 to spike fevers, be tachycardic.  -On assessment patient with complaints of diffuse bilateral knee bilateral  ankle and left great toe pain to the point he was having difficulty ambulating.   ?-Patient given IV Solu-Medrol x1 on 10/13/2021, placed on oral prednisone x5 days with clinical improvement.   ?-Once patient has completed treatment for acute flare may benefit from being placed on allopurinol.   ?-Outpatient follow-up with PCP. ? ?Hyponatremia ?- This may be hypovolemic. ?-Improved with hydration. ? ?Morbid obesity ([place]) ?- Lifestyle modification. ?-Outpatient follow-up with PCP ? ? ? ? ?  ? ? ?DVT prophylaxis: Lovenox ?Code Status: Full ?Family Communication: Updated patient.  No family at bedside. ?Disposition: SNF versus home with home health.  Patient really wants to go home and does not want to go to SNF.   ? ?Status is: Inpatient ?Remains inpatient appropriate because: Severity of illness. ?  ?Consultants:  ?[facility] hospitalist Dr. Sidney Ace 10/13/2021 ? ?Procedures:  ?CT head without contrast 10/13/2021, 10/09/2021 ?CT angio head and neck 10/09/2021 ? ?Chest x-ray 10/13/2021 ?MRI brain 10/10/2021 ?2D echo 10/10/2021 ? ? ? ? ?Antimicrobials:  ?IV cefepime 10/13/2021>>> 10/15/2021 ?IV Flagyl 10/13/2021>>>> oral Flagyl 10/14/2021>>> ?IV vancomycin 10/13/2021>>>> 10/15/2021 ?Levaquin 10/15/2021>>>> 10/18/2021 ? ? ? ?Subjective: ?Patient sitting in chair.  No chest pain.  Denies any shortness of breath.  No abdominal pain.  Stated had significant results after laxative yesterday.  Stated ambulated in the  hallway with walker with his family and was doing pretty well.  Concern ambulation close to his baseline.  Patient still wanting to go home with home health. ? ?Objective: ?Vitals:  ? 10/16/21 2330 10/17/21 0329 10/17/21 0841 10/17/21 1144  ?BP: (!) 144/99 125/79 131/83 132/83  ?Pulse: 64 (!) 50 (!) 48 (!) 51  ?Resp:  19 20   ?Temp: 98.1 ?F (36.7 ?C) 98.1 ?F (36.7 ?C) 98.1 ?F (36.7 ?C) 97.8 ?F (36.6 ?C)  ?TempSrc: Oral Oral Oral Oral  ?SpO2: 98% 95% 98% 97%  ? ? ?Intake/Output Summary (Last 24 hours) at 10/17/2021 1604 ?Last data filed at  10/17/2021 0900 ?Gross per 24 hour  ?Intake 100 ml  ?Output 200 ml  ?Net -100 ml  ? ?There were no vitals filed for this visit. ? ?Examination: ? ?General exam: NAD ?Respiratory system: Lungs clear to auscultat

## 2021-10-17 NOTE — Progress Notes (Signed)
Telemetry has reported sinus pauses with the last one being 2.69 and bradycardia with heart rate dropping to the mid 30's and returning to 50's.  Patient is asymptomatic and all other vitals have remained stable.  Dr. Leonel Ramsay notified via phone and new orders were placed in [facility] to be carried out.   ?

## 2021-10-17 NOTE — Progress Notes (Addendum)
MD ordered to ambulate patient to see if his heart rate will go up with exertion. Unfortunately the patient is too weak to fully ambulate but we were able to sit at the edge of bed, stand and pivot to the chair. While doing this, the patients heart rate maintained in the mid 70's. Patient tolerated well, no issues noted.   ? ?Patient update, patient able to ambulate out in hallway this afternoon, heart rate in the 90's. Stand by assist with rolling walker.  ?

## 2021-10-18 ENCOUNTER — Other Ambulatory Visit (HOSPITAL_COMMUNITY): Payer: Self-pay

## 2021-10-18 ENCOUNTER — Encounter: Payer: Self-pay | Admitting: Hematology

## 2021-10-18 DIAGNOSIS — K5904 Chronic idiopathic constipation: Secondary | ICD-10-CM | POA: Diagnosis not present

## 2021-10-18 DIAGNOSIS — I610 Nontraumatic intracerebral hemorrhage in hemisphere, subcortical: Secondary | ICD-10-CM | POA: Diagnosis not present

## 2021-10-18 DIAGNOSIS — M109 Gout, unspecified: Secondary | ICD-10-CM | POA: Diagnosis not present

## 2021-10-18 DIAGNOSIS — I4891 Unspecified atrial fibrillation: Secondary | ICD-10-CM | POA: Diagnosis not present

## 2021-10-18 LAB — BASIC METABOLIC PANEL
Anion gap: 9 (ref 5–15)
BUN: 27 mg/dL — ABNORMAL HIGH (ref 8–23)
CO2: 22 mmol/L (ref 22–32)
Calcium: 8.6 mg/dL — ABNORMAL LOW (ref 8.9–10.3)
Chloride: 106 mmol/L (ref 98–111)
Creatinine, Ser: 1.04 mg/dL (ref 0.61–1.24)
GFR, Estimated: >60 mL/min (ref >60)
Glucose, Bld: 100 mg/dL — ABNORMAL HIGH (ref 70–99)
Potassium: 4.4 mmol/L (ref 3.5–5.1)
Sodium: 137 mmol/L (ref 135–145)

## 2021-10-18 LAB — CBC WITH DIFFERENTIAL/PLATELET
Abs Immature Granulocytes: 0.51 10*3/uL — ABNORMAL HIGH (ref 0.00–0.07)
Basophils Absolute: 0.1 10*3/uL (ref 0.0–0.1)
Basophils Relative: 1 %
Eosinophils Absolute: 0 10*3/uL (ref 0.0–0.5)
Eosinophils Relative: 0 %
HCT: 37 % — ABNORMAL LOW (ref 39.0–52.0)
Hemoglobin: 12.2 g/dL — ABNORMAL LOW (ref 13.0–17.0)
Immature Granulocytes: 6 %
Lymphocytes Relative: 9 %
Lymphs Abs: 0.8 10*3/uL (ref 0.7–4.0)
MCH: 26.8 pg (ref 26.0–34.0)
MCHC: 33 g/dL (ref 30.0–36.0)
MCV: 81.3 fL (ref 80.0–100.0)
Monocytes Absolute: 1.1 10*3/uL — ABNORMAL HIGH (ref 0.1–1.0)
Monocytes Relative: 12 %
Neutro Abs: 6.5 10*3/uL (ref 1.7–7.7)
Neutrophils Relative %: 72 %
Platelets: 181 10*3/uL (ref 150–400)
RBC: 4.55 MIL/uL (ref 4.22–5.81)
RDW: 15.9 % — ABNORMAL HIGH (ref 11.5–15.5)
Smear Review: ADEQUATE
WBC: 9 10*3/uL (ref 4.0–10.5)
nRBC: 1 % — ABNORMAL HIGH (ref 0.0–0.2)

## 2021-10-18 LAB — GLUCOSE, CAPILLARY
Glucose-Capillary: 84 mg/dL (ref 70–99)
Glucose-Capillary: 94 mg/dL (ref 70–99)
Glucose-Capillary: 148 mg/dL — ABNORMAL HIGH (ref 70–99)

## 2021-10-18 LAB — CULTURE, BLOOD (ROUTINE X 2)
Culture: NO GROWTH
Culture: NO GROWTH
Special Requests: ADEQUATE
Special Requests: ADEQUATE

## 2021-10-18 LAB — MAGNESIUM: Magnesium: 2 mg/dL (ref 1.7–2.4)

## 2021-10-18 MED ORDER — ACETAMINOPHEN 500 MG PO TABS: 500.0000 mg | ORAL_TABLET | Freq: Four times a day (QID) | ORAL | 0 refills | Status: AC | PRN

## 2021-10-18 MED ORDER — PANTOPRAZOLE SODIUM 40 MG PO TBEC
40.0000 mg | DELAYED_RELEASE_TABLET | Freq: Every day | ORAL | 1 refills | Status: DC
  Filled 2021-10-18 – 2022-06-07 (×2): qty 30, 30d supply, fill #0

## 2021-10-18 MED ORDER — LEVETIRACETAM 500 MG PO TABS
500.0000 mg | ORAL_TABLET | Freq: Two times a day (BID) | ORAL | 2 refills | Status: DC
Start: 2021-10-18 — End: 2022-07-21
  Filled 2021-10-18 – 2022-06-07 (×2): qty 60, 30d supply, fill #0

## 2021-10-18 MED ORDER — ALLOPURINOL 100 MG PO TABS: 100.0000 mg | ORAL_TABLET | Freq: Every day | ORAL | Status: DC

## 2021-10-18 MED ORDER — ALLOPURINOL 100 MG PO TABS
100.0000 mg | ORAL_TABLET | Freq: Every day | ORAL | 1 refills | Status: DC
  Filled 2021-10-18 – 2022-06-07 (×2): qty 30, 30d supply, fill #0

## 2021-10-18 MED ORDER — DILTIAZEM HCL 60 MG PO TABS
120.0000 mg | ORAL_TABLET | Freq: Every day | ORAL | 1 refills | Status: DC
  Filled 2021-10-18 – 2022-06-07 (×2): qty 60, 30d supply, fill #0

## 2021-10-18 MED ORDER — POLYETHYLENE GLYCOL 3350 17 GM/SCOOP PO POWD
17.0000 g | Freq: Every day | ORAL | 1 refills | Status: DC
  Filled 2021-10-18 – 2022-06-07 (×3): qty 238, 14d supply, fill #0

## 2021-10-18 MED ORDER — DICLOFENAC SODIUM 1 % EX GEL
2.0000 g | Freq: Four times a day (QID) | CUTANEOUS | 0 refills | Status: DC | PRN
  Filled 2021-10-18: qty 100, 13d supply, fill #0

## 2021-10-18 MED ORDER — SENNOSIDES-DOCUSATE SODIUM 8.6-50 MG PO TABS
1.0000 | ORAL_TABLET | Freq: Two times a day (BID) | ORAL | Status: DC
Start: 2021-10-18 — End: 2023-12-11

## 2021-10-18 NOTE — Progress Notes (Signed)
Physical Therapy Treatment ?Patient Details ?Name: Steven TEBBETTS ?MRN: [id number] ?DOB: Jan 07, 1951 ?Today's Date: 10/18/2021 ? ? ?History of Present Illness 71 y.o. male presents to [facility] hospital as a transfer from [place] on 10/09/2021 with AMS, incontinence and weakness. 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 Pt experienced a seizure when at [place]. PMH includes Afib on coumadin, colon cancer, Diastolic CHF, HTN, OSA.  Pt. with significant B LE pain secondary to gout and code sepsis 4/12. ? ?  ?PT Comments  ? ? Pt much improved from pain stand point and was able to safely ambulate 100' with RW and min guard assist with verbal cues for safe walker management. Complete stair negotiation with patient and daughter for safe entry in home. Daughter states pt is going home with her brother (patient's son) who doesn't have stairs and where the dtr-in-law will be home 24/7. Pt safe to d/c home with family providing 24/7 assist and pt using an extra wide walker. Acute PT to cont to follow. ?   ?Recommendations for follow up therapy are one component of a multi-disciplinary discharge planning process, led by the attending physician.  Recommendations may be updated based on patient status, additional functional criteria and insurance authorization. ? ?Follow Up Recommendations ? Outpatient PT ?  ?  ?Assistance Recommended at Discharge Frequent or constant Supervision/Assistance  ?Patient can return home with the following   ?  ?Equipment Recommendations ? Rolling walker (2 wheels) (extra wide RW)  ?  ?Recommendations for Other Services   ? ? ?  ?Precautions / Restrictions Precautions ?Precautions: Fall ?Restrictions ?Weight Bearing Restrictions: No  ?  ? ?Mobility ? Bed Mobility ?Overal bed mobility: Needs Assistance ?Bed Mobility: Rolling, Sidelying to Sit ?Rolling: Min assist ?Sidelying to sit: Min assist ?  ?  ?  ?General bed mobility comments: max directional verbal and tactile cues to transfer to EOB with  HOB flat and no bed rail to mimic home set up ?  ? ?Transfers ?Overall transfer level: Needs assistance ?Equipment used: Rolling walker (2 wheels) ?Transfers: Sit to/from Stand ?Sit to Stand: Min guard ?  ?  ?  ?  ?  ?General transfer comment: verbal cues for safe hand placement, increased time, min guard for safety ?  ? ?Ambulation/Gait ?Ambulation/Gait assistance: Min guard, +2 safety/equipment (2nd person for chair follow) ?Gait Distance (Feet): 100 Feet ?Assistive device: Rolling walker (2 wheels) ?Gait Pattern/deviations: Step-through pattern, Decreased stride length, Wide base of support ?Gait velocity: dec ?Gait velocity interpretation: <1.31 ft/sec, indicative of household ambulator ?  ?General Gait Details: verbal cues to stay in walker, initially step to gait pattern leading with R LE, instructed to lead with L LE due to being the painful LE, pt unable to sequence but transitioned to short step through gait pattern ? ? ?Stairs ?Stairs: Yes ?Stairs assistance: Min assist ?Stair Management: Step to pattern, Backwards, With walker ?Number of Stairs: 2 ?General stair comments: extensive time spend on stair negotiation due to pt's poor/inconsistent report of entrance. Ultimately instructed pt and daughter (who came at end of session) on how to ascend stairs sequencing "Up with the good (R), down with the bad (L)" going backwards using RW. ? ? ?Wheelchair Mobility ?  ? ?Modified Rankin (Stroke Patients Only) ?Modified Rankin (Stroke Patients Only) ?Pre-Morbid Rankin Score: No significant disability ?Modified Rankin: Moderately severe disability ? ? ?  ?Balance Overall balance assessment: Needs assistance ?Sitting-balance support: Feet supported ?Sitting balance-Leahy Scale: Fair ?  ?  ?Standing  balance support: Bilateral upper extremity supported ?Standing balance-Leahy Scale: Poor ?Standing balance comment: Requires RW and assist to stand and steady. Requires support for all dynamic movement ?  ?  ?  ?  ?  ?  ?   ?  ?  ?  ?  ?  ? ?  ?Cognition Arousal/Alertness: Awake/alert ?Behavior During Therapy: [facility] for tasks assessed/performed ?Overall Cognitive Status: Impaired/Different from baseline ?Area of Impairment: Memory, Safety/judgement, Problem solving ?  ?  ?  ?  ?  ?  ?  ?  ?  ?  ?Memory: Decreased short-term memory ?Following Commands: Follows multi-step commands inconsistently ?Safety/Judgement: Decreased awareness of safety, Decreased awareness of deficits ?Awareness: Emergent ?Problem Solving: Difficulty sequencing, Requires verbal cues, Requires tactile cues ?General Comments: pt with inconsistent report of home set up, difficulty processing how to negotiate stairs despite max verbal cues and demonstration. Pt reported being d/c'd to his daughters house but she is leaving for [place] on wednesday, later into session daughter arrives and states he will be d/c'd to his son's home. Pt with poor STM, suspect this to be baseline. ?  ?  ? ?  ?Exercises   ? ?  ?General Comments General comments (skin integrity, edema, etc.): VSS, pt difficulty to follow, inconsistent report of home set up ?  ?  ? ?Pertinent Vitals/Pain Pain Assessment ?Pain Assessment: Faces ?Faces Pain Scale:  (states "my toes doesn't hurt unles you touch it"')  ? ? ?Home Living   ?  ?  ?  ?  ?  ?  ?  ?  ?  ?   ?  ?Prior Function    ?  ?  ?   ? ?PT Goals (current goals can now be found in the care plan section) Acute Rehab PT Goals ?Patient Stated Goal: gohome ?Progress towards PT goals: Progressing toward goals ? ?  ?Frequency ? ? ? Min 3X/week ? ? ? ?  ?PT Plan Discharge plan needs to be updated  ? ? ?Co-evaluation   ?  ?  ?  ?  ? ?  ?AM-PAC PT "6 Clicks" Mobility   ?Outcome Measure ? Help needed turning from your back to your side while in a flat bed without using bedrails?: A Little ?Help needed moving from lying on your back to sitting on the side of a flat bed without using bedrails?: A Little ?Help needed moving to and from a bed to a chair (including  a wheelchair)?: A Little ?Help needed standing up from a chair using your arms (e.g., wheelchair or bedside chair)?: A Little ?Help needed to walk in hospital room?: A Little ?Help needed climbing 3-5 steps with a railing? : A Lot ?6 Click Score: 17 ? ?  ?End of Session Equipment Utilized During Treatment: Gait belt ?Activity Tolerance: Patient tolerated treatment well ?Patient left: in chair;with call bell/phone within reach;with chair alarm set;with family/visitor present ?Nurse Communication: Mobility status ?PT Visit Diagnosis: Other abnormalities of gait and mobility (R26.89);Muscle weakness (generalized) (M62.81);Other symptoms and signs involving the nervous system (R29.898);Pain ?Pain - Right/Left: Left ?Pain - part of body:  (toe) ?  ? ? ?Time: [id number] ?PT Time Calculation (min) (ACUTE ONLY): 39 min ? ?Charges:  $Gait Training: 23-37 mins ?$Therapeutic Activity: 8-22 mins          ?          ? ?Kittie Plater, PT, DPT ?Acute Rehabilitation Services ?Secure chat preferred ?Office #: [phone] ? ? ? ?Kentravious Lipford M Shankar Silber ?10/18/2021, 1:14  PM ? ?

## 2021-10-18 NOTE — Progress Notes (Signed)
Patient requires a wide walker due to his body habitus. He will not be safe using a regular sized walker.  ?

## 2021-10-18 NOTE — Progress Notes (Signed)
Discharge instructions reviewed with pt and nephew.  ?Copy of instructions given to pt. Pt had prescriptions filled and delivered to his room by [facility] [place].  Discussed CHF education with pt--daily weights, and when to call the MD. Handouts given on CHF and stroke and reviewed with pt and nephew.  ?Pt d/c'd via wheelchair with belongings, with nephew.           ?Escorted by unit NT.  ?

## 2021-10-18 NOTE — TOC Transition Note (Signed)
Transition of Care (TOC) - CM/SW Discharge Note ? ? ?Patient Details  ?Name: Steven Barnett ?MRN: [id number] ?Date of Birth: [birth year] ? ?Transition of Care (TOC) CM/SW Contact:  ?Pollie Friar, RN ?Phone Number: ?10/18/2021, 3:02 PM ? ? ?Clinical Narrative:    ?Patient is discharging home with outpatient therapy through [facility]. Information on the AVS. Pt has needed transportation to appointments. Pts daughter assists with his medications. Pt will discharge home with his daughter and then will stay with his son while his daughter is out of town. Son will assist with his needs.  ?Wide walker for home has been delivered to his room.  ?Pt has transportation home.  ? ? ?Final next level of care: OP Rehab ?Barriers to Discharge: No Barriers Identified ? ? ?Patient Goals and CMS Choice ?Patient states their goals for this hospitalization and ongoing recovery are:: Rehab ?CMS Medicare.gov Compare Post Acute Care list provided to:: Patient ?Choice offered to / list presented to : Patient, Adult Children ? ?Discharge Placement ?  ?           ?  ?  ?  ?  ? ?Discharge Plan and Services ?In-house Referral: Clinical Social Work ?  ?Post Acute Care Choice:  (OP Therapy)          ?DME Arranged: [facility] wide ?DME Agency: [employer] ?Date DME Agency Contacted: 10/18/21 ?  ?Representative spoke with at DME Agency: [employer] ?  ?  ?  ?  ?  ? ?Social Determinants of Health (SDOH) Interventions ?  ? ? ?Readmission Risk Interventions ?   ? View : No data to display.  ?  ?  ?  ? ? ? ? ? ?

## 2021-10-18 NOTE — Discharge Summary (Signed)
Physician Discharge Summary  ?Steven Barnett QMG:[id number] DOB: [birth year] DOA: 10/09/2021 ? ?PCP: Elisabeth Cara, PA-C ? ?Admit date: 10/09/2021 ?Discharge date: 10/18/2021 ? ?Time spent: 55 minutes ? ?Recommendations for Outpatient Follow-up:  ?Follow-up with Dr. Leonie Man, neurology in 1 month. ?Follow-up with Dr. [name], cardiology in 4 weeks. ?Follow-up with [place], [place], PA-C in 2 weeks.  On follow-up patient will need a basic metabolic profile, magnesium level, CBC done to follow-up on electrolytes, renal function, H&H.  Patient's constipation will need to be followed up upon.  Patient blood pressure and gout will also need to be followed up upon ? ? ?Discharge Diagnoses:  ?Principal Problem: ?  ICH (intracerebral hemorrhage) ([place]) ?Active Problems: ?  SIRS (systemic inflammatory response syndrome) (HCC) ?  Atrial fibrillation with rapid ventricular response ([place]) ?  Morbid obesity ([place]) ?  Hyponatremia ?  Gout ?  Hypertensive emergency ?  Seizure ([place]) ?  Constipation ? ? ?Discharge Condition: Stable and improved. ? ?Diet recommendation: Heart healthy ? ?There were no vitals filed for this visit. ? ?History of present illness:  ?HPI per Dr. Rory Percy ?Steven Barnett is a 71 y.o. male PMH of Afib on coumadin, colon cancer, Diastolic CHF, HTN, OSA, came in to [facility] for evaluation of AMS. ?Last known well presumably 12 PM when he felt acutely weak.  Family took him to lunch but he did not seem to be as interactive as his normal self.  He then became incontinent of urine and had some shaking of his left arm.  Patient unable to provide reliable history.  Brought into [facility] where he was seen by telemedicine neurology.  Hypertensive with blood pressures in the 160s.  CT head with acute small intraparenchymal hematoma within the left temporal occipital junction just inferior to the atrium and occipital horn of the left lateral ventricle.  No frank IVH.  CTA head and neck negative for underlying  vascular malformation.  While did not a neurologist was examining the patient he had a witnessed complex partial seizure involving the right hemibody and rightward gaze deviation. ?He was given benzos and loaded with Keppra.  Coumadin was reversed ?Transferred to [facility] for further management.  Currently requiring Cleviprex for blood pressure control ?  ?  ?LKW: 12 PM 10/09/2021 ?tpa given?: no, ICH ?Premorbid modified Rankin scale (mRS): Unable to ascertain ?ICH Score: 0 ?Hospital Course:  ? ?Assessment and Plan: ?* ICH (intracerebral hemorrhage) ([name]) ?- Code stroke was called on admission due to acute small ICH within the left temporal occipital junction inferior to the atrium and occipital horn of the left lateral ventricle.   ?-CT angio head and neck negative for underlying vascular malformation.   ?-MRI done with stable hematoma.   ?-2D echo with a EF of 60 to 65%, no cardiac source of embolism.   ?-EEG done was negative for any epileptiform activity or seizures.   ?-Fasting lipid panel with LDL of 62.   ?-Hemoglobin A1c 5.0.   ?-  Coumadin was discontinued given his ICH. ?-Patient started on aspirin 81 mg daily per neurology recommendations. ?-PT recommended SNF however patient wants to really go home and ambulated in hallway with walker.  Son feels patient is close to baseline. ?-Patient reassessed by PT and had improved on day of discharge. ?-Outpatient to follow-up with neurology. ? ?SIRS (systemic inflammatory response syndrome) (HCC) ?SIRS ruled out ?- The patient had a fever of unknown etiology.??  Likely secondary to gout flare. ?- Patient  pancultured with no growth to date in terms of blood cultures.  Differential diagnoses include atelectasis. ?- Urine cultures with 20,000 colonies of E. coli.  ?-Patient was placed empirically on IV vancomycin, IV Flagyl, IV cefepime.  ?-Leukocytosis trended down. ?-Patient afebrile.  ?- Patient pancultured and placed empirically on IV vancomycin, IV  Flagyl, IV ?- Given abnormal portable chest x-ray 2 view was ordered but not done.   ?-Patient placed on incentive spirometry, flutter valve.   ?-MRSA PCR negative.   ?-Patient also placed on steroids due to concern for acute gouty flare.   ?-Patient remained afebrile for the rest of the hospitalization.  -IV antibiotics discontinued patient placed on Levaquin to complete a 5-day course of antibiotic treatment.  ? ?Atrial fibrillation with rapid ventricular response ([place]) ?-This has continued despite fever control. ?- She was therefore given 5 mg of IV Lopressor on 10/13/2021. ?-Heart rate improved. ?-Norvasc discontinued and patient placed on Cardizem 30 mg p.o. twice daily.   ?-Patient did not need a Cardizem drip. ?-Patient noted to have some bouts of bradycardia while sleeping and sinus pause of 2.6 seconds however remained asymptomatic. ?-Heart rate improved with exertion and ambulation. ?-Due to ICH, Coumadin discontinued. ?-Patient placed on aspirin 81 mg daily  ?-Patient on aspirin 81 mg daily.   ?-Patient with discharge back home on home regimen of Cardizem CD1 20 mg daily on discharge.   ?-Outpatient follow-up with primary cardiologist.   ? ? ?Constipation ?-Stated had significant results with bowel regimen of MiraLAX, Senokot and 2 doses of sorbitol.  ?-Patient maintained on MiraLAX and Senokot-S.  ?-Outpatient follow-up.  ? ?Seizure ([place]) ?-No seizures noted. ?-Patient was placed on Keppra. ?-Outpatient follow-up with neurology.  ? ?Hypertensive emergency ?- Initially required cleviprex drip which has subsequently been discontinued. ?-Patient placed on Cardizem and home regimen Lasix resumed.  ?-Outpatient follow-up with PCP. ? ?Gout ?- Patient noted with history of gout as noted to be on colchicine prior to admission  ?-Patient noted on 10/12/2021 to spike fevers, be tachycardic.  -On assessment patient with complaints of diffuse bilateral knee bilateral ankle and left great toe pain to the point he was  having difficulty ambulating.   ?-Patient given IV Solu-Medrol x1 on 10/13/2021, placed on oral prednisone x5 days with clinical improvement.   ?-Patient completed a full course of prednisone and placed on allopurinol.  ?-Outpatient follow-up with PCP. ? ?Hyponatremia ?- This may be hypovolemic. ?-Improved with hydration. ? ?Morbid obesity ([place]) ?- Lifestyle modification. ?-Outpatient follow-up with PCP ? ? ? ? ?  ? ?Procedures: ?CT head without contrast 10/13/2021, 10/09/2021 ?CT angio head and neck 10/09/2021 ?  ?Chest x-ray 10/13/2021 ?MRI brain 10/10/2021 ?2D echo 10/10/2021 ? ?Consultations: ?Neurology ?[facility] hospitalist ? ?Discharge Exam: ?Vitals:  ? 10/18/21 0803 10/18/21 1129  ?BP: (!) 141/91 (!) 147/99  ?Pulse: (!) 59 67  ?Resp: 14 20  ?Temp: 97.8 ?F (36.6 ?C) 97.9 ?F (36.6 ?C)  ?SpO2: 94% 100%  ? ? ?General: NAD ?Cardiovascular: Regular rate rhythm no murmurs rubs or gallops.  No JVD.  No lower extremity edema. ?Respiratory: Clear to auscultation bilaterally.  No wheezes, no crackles, no rhonchi. ? ?Discharge Instructions ? ? ?Discharge Instructions   ? ? Ambulatory referral to Neurology   Complete by: As directed ?  ? Follow up with Dr. Leonie Man at [facility] in 4-6 weeks. Too complicated for RN to follow. Thanks.  ? Ambulatory referral to Occupational Therapy   Complete by: As directed ?  ? Ambulatory referral to  Physical Therapy   Complete by: As directed ?  ? Ambulatory referral to Speech Therapy   Complete by: As directed ?  ? Diet - low sodium heart healthy   Complete by: As directed ?  ? Increase activity slowly   Complete by: As directed ?  ? ?  ? ?Allergies as of 10/18/2021   ?No Known Allergies ?  ? ?  ?Medication List  ?  ? ?STOP taking these medications   ? ?HYDROcodone-acetaminophen 5-325 MG tablet ?Commonly known as: NORCO/VICODIN ?  ?predniSONE 10 MG (21) Tbpk tablet ?Commonly known as: STERAPRED UNI-PAK 21 TAB ?  ?warfarin 4 MG tablet ?Commonly known as: COUMADIN ?  ? ?  ? ?TAKE these medications    ? ?acetaminophen 500 MG tablet ?Commonly known as: TYLENOL ?Take 1 tablet (500 mg total) by mouth every 6 (six) hours as needed. Take 1 tablet 500 mg 3 times daily x5 days, then every 6 hours as needed. ?What changed:  ?how m

## 2021-10-18 NOTE — Progress Notes (Signed)
Occupational Therapy Treatment ?Patient Details ?Name: Steven Barnett ?MRN: [id number] ?DOB: May 26, 1951 ?Today's Date: 10/18/2021 ? ? ?History of present illness 71 y.o. male presents to [facility] hospital as a transfer from [place] on 10/09/2021 with AMS, incontinence and weakness. 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 Pt experienced a seizure when at [place]. PMH includes Afib on coumadin, colon cancer, Diastolic CHF, HTN, OSA.  Pt. with significant B LE pain secondary to gout and code sepsis 4/12. ?  ?OT comments ? Patient received in bed and agreeable to OT session.  Patient was min assist to get to EOB but min guard to stand from EOB and ambulate to sink. Patient performed grooming at sink and nursing informed him of discharge home today. Patient performed peri area bathing and dressing seated in recliner with min assist for UB dressing and mod assist for LB dressing. Patient is expected to discharge home today.   ? ?Recommendations for follow up therapy are one component of a multi-disciplinary discharge planning process, led by the attending physician.  Recommendations may be updated based on patient status, additional functional criteria and insurance authorization. ?   ?Follow Up Recommendations ? Home health OT  ?  ?Assistance Recommended at Discharge Frequent or constant Supervision/Assistance  ?Patient can return home with the following ? Assistance with cooking/housework;Direct supervision/assist for medications management;Direct supervision/assist for financial management;Assist for transportation;A lot of help with walking and/or transfers;A lot of help with bathing/dressing/bathroom ?  ?Equipment Recommendations ?    ?  ?Recommendations for Other Services   ? ?  ?Precautions / Restrictions Precautions ?Precautions: Fall ?Restrictions ?Weight Bearing Restrictions: No  ? ? ?  ? ?Mobility Bed Mobility ?Overal bed mobility: Needs Assistance ?Bed Mobility: Rolling, Sidelying to Sit ?Rolling: Min  assist ?Sidelying to sit: Min assist ?  ?  ?  ?General bed mobility comments: required assistance to raise trunk with verbal cues for technique ?  ? ?Transfers ?Overall transfer level: Needs assistance ?Equipment used: Rolling walker (2 wheels) ?Transfers: Sit to/from Stand ?Sit to Stand: Min guard ?  ?  ?  ?  ?  ?General transfer comment: min guard for safety ?  ?  ?Balance Overall balance assessment: Needs assistance ?Sitting-balance support: Feet supported ?Sitting balance-Leahy Scale: Fair ?  ?  ?Standing balance support: Bilateral upper extremity supported, Single extremity supported, During functional activity ?Standing balance-Leahy Scale: Fair ?Standing balance comment: able to stand at sink for grooing and able to perform peri area cleaning with one extremity support ?  ?  ?  ?  ?  ?  ?  ?  ?  ?  ?  ?   ? ?ADL either performed or assessed with clinical judgement  ? ?ADL Overall ADL's : Needs assistance/impaired ?  ?  ?Grooming: Wash/dry hands;Wash/dry face;Oral care;Min guard;Standing ?Grooming Details (indicate cue type and reason): at sink ?  ?  ?Lower Body Bathing: Min guard;Sit to/from stand ?Lower Body Bathing Details (indicate cue type and reason): for peri area bathing ?Upper Body Dressing : Minimal assistance;Sitting ?Upper Body Dressing Details (indicate cue type and reason): donning T-shirt ?Lower Body Dressing: Moderate assistance;Sit to/from stand ?Lower Body Dressing Details (indicate cue type and reason): assistance with getting pants over feet and with socks and shoes, patient able to pull up while standing ?  ?  ?  ?  ?  ?  ?  ?General ADL Comments: grooming perfomred standing at sink,peri area bathing and dressing performed at recliner ?  ? ?  Extremity/Trunk Assessment Upper Extremity Assessment ?RUE Deficits / Details: limited ROM in hands - pt states this is baseline. RUE is overall WFL ?LUE Deficits / Details: limited ROM in hands - pt states this is baseline. LUE is overal WFL ?  ?  ?  ?   ?  ? ?Vision   ?  ?  ?Perception   ?  ?Praxis   ?  ? ?Cognition Arousal/Alertness: Awake/alert ?Behavior During Therapy: [facility] for tasks assessed/performed ?Overall Cognitive Status: Impaired/Different from baseline ?Area of Impairment: Memory, Safety/judgement, Problem solving ?  ?  ?  ?  ?  ?  ?  ?  ?Orientation Level: Situation ?  ?Memory: Decreased short-term memory ?Following Commands: Follows multi-step commands inconsistently ?Safety/Judgement: Decreased awareness of safety, Decreased awareness of deficits ?Awareness: Emergent ?Problem Solving: Difficulty sequencing, Requires verbal cues, Requires tactile cues ?General Comments: required cues tobe repeated, asked during grooming what he was to do next ?  ?  ?   ?Exercises   ? ?  ?Shoulder Instructions   ? ? ?  ?General Comments VSS, pt difficulty to follow, inconsistent report of home set up  ? ? ?Pertinent Vitals/ Pain       Pain Assessment ?Pain Assessment: Faces ?Faces Pain Scale: Hurts a little bit ?Pain Location: left LE ?Pain Descriptors / Indicators: Aching, Sore ?Pain Intervention(s): Monitored during session ? ?Home Living   ?  ?  ?  ?  ?  ?  ?  ?  ?  ?  ?  ?  ?  ?  ?  ?  ?  ?  ? ?  ?Prior Functioning/Environment    ?  ?  ?  ?   ? ?Frequency ? Min 2X/week  ? ? ? ? ?  ?Progress Toward Goals ? ?OT Goals(current goals can now be found in the care plan section) ? Progress towards OT goals: Progressing toward goals ? ?Acute Rehab OT Goals ?Patient Stated Goal: go home ?OT Goal Formulation: With patient ?Time For Goal Achievement: 10/24/21 ?Potential to Achieve Goals: Good ?ADL Goals ?Pt Will Perform Grooming: Independently;standing ?Pt Will Transfer to Toilet: ambulating;with modified independence ?Pt Will Perform Toileting - Clothing Manipulation and hygiene: with modified independence;sitting/lateral leans ?Additional ADL Goal #1: Pt will indep complete a 3 step ADL tasks in a nondistracting environment  ?Plan Discharge plan needs to be updated    ? ?Co-evaluation ? ? ?   ?  ?  ?  ?  ? ?  ?AM-PAC OT "6 Clicks" Daily Activity     ?Outcome Measure ? ? Help from another person eating meals?: None ?Help from another person taking care of personal grooming?: A Little ?Help from another person toileting, which includes using toliet, bedpan, or urinal?: A Lot ?Help from another person bathing (including washing, rinsing, drying)?: A Lot ?Help from another person to put on and taking off regular upper body clothing?: A Little ?Help from another person to put on and taking off regular lower body clothing?: A Lot ?6 Click Score: 16 ? ?  ?End of Session Equipment Utilized During Treatment: Gait belt;Rolling walker (2 wheels) ? ?OT Visit Diagnosis: Unsteadiness on feet (R26.81);Other abnormalities of gait and mobility (R26.89);Muscle weakness (generalized) (M62.81) ?  ?Activity Tolerance Patient tolerated treatment well ?  ?Patient Left in chair;with call bell/phone within reach ?  ?Nurse Communication Mobility status ?  ? ?   ? ?Time: 8841-6606 ?OT Time Calculation (min): 44 min ? ?Charges: OT General Charges ?$OT Visit: 1 Visit ?  OT Treatments ?$Self Care/Home Management : 38-52 mins ? ?Lodema Hong, OTA ?Acute Rehabilitation Services  ?Pager [phone] ?Office [phone] ? ? ?[place] ?10/18/2021, 3:37 PM ?

## 2021-10-20 ENCOUNTER — Encounter: Payer: Self-pay | Admitting: Hematology

## 2021-11-06 ENCOUNTER — Other Ambulatory Visit (HOSPITAL_COMMUNITY): Payer: Self-pay

## 2021-11-08 ENCOUNTER — Other Ambulatory Visit (HOSPITAL_COMMUNITY): Payer: Self-pay

## 2021-12-29 ENCOUNTER — Inpatient Hospital Stay: Payer: Medicare HMO | Admitting: Neurology

## 2022-01-13 ENCOUNTER — Inpatient Hospital Stay: Payer: Medicare HMO | Admitting: Neurology

## 2022-01-13 ENCOUNTER — Encounter: Payer: Self-pay | Admitting: Neurology

## 2022-02-03 ENCOUNTER — Telehealth: Payer: Self-pay | Admitting: Internal Medicine

## 2022-02-03 NOTE — Telephone Encounter (Signed)
Pt c/o medication issue:  1. Name of Medication: Eliquis   2. How are you currently taking this medication (dosage and times per day)?   3. Are you having a reaction (difficulty breathing--STAT)?   4. What is your medication issue? Pt's daughter is requesting call back to discuss father being in hospital for stroke. She says they want him to start taking Eliquis and have set him up for a free 30 day supply, but wanted to check with the pt's cardiologist before starting this. Please advise.

## 2022-02-03 NOTE — Telephone Encounter (Signed)
Returned the call to the daughter. She stated that the patient will be discharged from the hospital tomorrow from seizures and altered mental status. The patient was started on Eliquis 5 mg bid. He was previously on Coumadin but this was discontinued in April due to [place].   The daughter was calling for a fyi. He has a follow up on 8/10.

## 2022-02-04 ENCOUNTER — Telehealth: Payer: Self-pay | Admitting: Hematology

## 2022-02-04 NOTE — Telephone Encounter (Signed)
Scheduled follow-up appointment per 8/4 staff message. Patient's sister is aware.

## 2022-02-09 NOTE — Progress Notes (Unsigned)
Cardiology Clinic Note   Patient Name: Steven Barnett Date of Encounter: 02/09/2022  Primary Care Provider:  Elisabeth Cara, [place] Primary Cardiologist:  Pixie Casino, MD  Patient Profile    Steven Barnett 71 year old male presents to the clinic today for follow up evaluation of his HTN and a fib.  Past Medical History    Past Medical History:  Diagnosis Date   Anemia    Arthralgia of both knees    Arthritis    "right knee" (10/01/2015)   Atrial fibrillation ([place])    Cancer ([place])    STAGE 1 COLON CANCER   Chronic anticoagulation 2010   Coumadin   Chronic diastolic CHF (congestive heart failure), NYHA class 2 ([place])    Dyspnea    Dysrhythmia    Hypertension    OSA (obstructive sleep apnea) 2016   "couldn't take the mask during the testing" (10/01/2015)   Permanent atrial fibrillation ([place]) 2010   Pneumonia 3/29/2017and june 2017   Past Surgical History:  Procedure Laterality Date   COLONOSCOPY WITH PROPOFOL N/A 03/04/2016   Procedure: COLONOSCOPY WITH PROPOFOL;  Surgeon: Carol Ada, MD;  Location: [facility];  Service: Endoscopy;  Laterality: N/A;   COLONOSCOPY WITH PROPOFOL N/A 09/15/2017   Procedure: COLONOSCOPY WITH PROPOFOL;  Surgeon: Carol Ada, MD;  Location: [facility];  Service: Endoscopy;  Laterality: N/A;   COLONOSCOPY WITH PROPOFOL N/A 02/12/2021   Procedure: COLONOSCOPY WITH PROPOFOL;  Surgeon: Carol Ada, MD;  Location: [facility];  Service: Endoscopy;  Laterality: N/A;   LAPAROSCOPIC PARTIAL COLECTOMY N/A 04/12/2016   Procedure: LAPAROSCOPIC ILEOCOLECTOMY;  Surgeon: [name], MD;  Location: [facility];  Service: General;  Laterality: N/A;   PORT-A-CATH REMOVAL N/A 02/16/2017   Procedure: REMOVAL PORT-A-CATH;  Surgeon: [name], MD;  Location: [facility];  Service: General;  Laterality: N/A;   PORTACATH PLACEMENT N/A 05/18/2016   Procedure: INSERTION PORT-A-CATH;  Surgeon: [name], MD;  Location: [facility];  Service:  General;  Laterality: N/A;   THORACOTOMY Right 2010    Allergies  No Known Allergies  History of Present Illness    Steven Barnett has a PMH of HTN, Afib, Chronic diastolic CHF, Hypotension, OSA, PNA, Colon Cancer, Morbid obesity, ED, Septic shock, Hypokalemia, Hypomagnesemia, Hyponatremia, Gout, Seizure, and Constipation.  CHA2DS2-VASc score 3 CHF, HTN, age  Patient was contacted via virtual appointment by Laurann Montana, NP-C on 01/27/2021.  He was in [place] at the time of the call.  He was traveling with his wife.  His blood pressure was 110-1 130s.  His weights were stable.  He reported not checking daily.  He denied shortness of breath and dyspnea with exertion.  He denied chest pain pressure and tightness.  He denied PND orthopnea palpitations.  He denied presyncope and syncope.  His legs were noted to be edematous via video visit.  His furosemide was continued as well as his potassium.  Follow-up was planned for 2 months.  He was admitted to [facility] for altered mental status and seizure.  He was admitted on 01/27/2022 and discharged on 02/04/2022.  It was felt that his breakthrough seizures were secondary to medication noncompliance.  He presents to the clinic today for follow-up evaluation states***  *** denies chest pain, shortness of breath, lower extremity edema, fatigue, palpitations, melena, hematuria, hemoptysis, diaphoresis, weakness, presyncope, syncope, orthopnea, and PND.  Chronic diastolic CHF-euvolemic today.  No increased DOE or activity intolerance. Continue  furosemide, potassium Heart healthy low-sodium diet-salty 6 given Increase physical activity as tolerated  Essential hypertension-BP today***.  Well-controlled at home. Continue diltiazem Heart healthy low-sodium diet-salty 6 given Increase physical activity as tolerated  Atrial fibrillation-denies recent episodes of accelerated or irregular heartbeat.  Reports compliance with  warfarin and denies bleeding issues. Continue warfarin Increase physical activity as tolerated Avoid triggers caffeine, chocolate, EtOH, dehydration etc.  Disposition: Follow-up with Dr. [name] or me in 6 months.  Home Medications    Prior to Admission medications   Medication Sig Start Date End Date Taking? Authorizing Provider  acetaminophen (TYLENOL) 500 MG tablet Take 1 tablet (500 mg total) by mouth every 6 (six) hours as needed. Take 1 tablet 500 mg 3 times daily x5 days, then every 6 hours as needed. 10/18/21   Eugenie Filler, MD  albuterol (PROVENTIL HFA;VENTOLIN HFA) 108 (90 Base) MCG/ACT inhaler Inhale 2 puffs into the lungs every 6 (six) hours as needed for wheezing or shortness of breath. Patient not taking: Reported on 10/10/2021 10/01/15   Nita Sells, MD  allopurinol (ZYLOPRIM) 100 MG tablet Take 1 tablet (100 mg total) by mouth daily. 10/19/21   Eugenie Filler, MD  aspirin EC 81 MG tablet Take 81 mg by mouth every morning.    [provider]  Colchicine 0.6 MG CAPS Take 0.6 mg by mouth 2 (two) times daily.    [provider]  diclofenac Sodium (VOLTAREN) 1 % GEL Apply 2 g topically 4 (four) times daily as needed (for joint pain). 10/18/21   Eugenie Filler, MD  diltiazem (CARDIZEM) 60 MG tablet Take 2 tablets (120 mg total) by mouth daily. 10/18/21   Eugenie Filler, MD  furosemide (LASIX) 40 MG tablet TAKE 1 TABLET EVERY DAY Patient taking differently: Take 40 mg by mouth every morning. 06/23/21   Loel Dubonnet, NP  levETIRAcetam (KEPPRA) 500 MG tablet Take 1 tablet (500 mg total) by mouth 2 (two) times daily. 10/18/21   Eugenie Filler, MD  pantoprazole (PROTONIX) 40 MG tablet Take 1 tablet (40 mg total) by mouth daily. 10/18/21   Eugenie Filler, MD  polyethylene glycol powder (GLYCOLAX/MIRALAX) 17 GM/SCOOP powder Take 1 capful (17 g) with water by mouth daily. 10/19/21   Eugenie Filler, MD  potassium chloride (KLOR-CON) 10 MEQ  tablet Take 1 tablet (10 mEq total) by mouth daily. Patient taking differently: Take 10 mEq by mouth every morning. 09/23/21   Loel Dubonnet, NP  senna-docusate (SENOKOT-S) 8.6-50 MG tablet Take 1 tablet by mouth 2 (two) times daily. 10/18/21   Eugenie Filler, MD  TURMERIC PO Take 1,000 mg by mouth every morning.    [provider]    Family History    Family History  Problem Relation Age of Onset   Heart disease Father    Heart failure Father    Colon polyps Father        unspecified number - pt of intestine surgically resected   Hypertension Mother    Diabetes Mother    Hyperlipidemia Mother    Colon polyps Mother        unspecified number - pt of intestine surgically resected   Dementia Mother        d. 55   Stroke Maternal Grandmother    Stroke Sister    Colon cancer Sister 20       w/ "cancerous polyps" - unspecified number; underwent surgery and radiation   Colon polyps Brother  unspecified number - polypectomies   Colon cancer Sister        dx 61-60; s/p surgery and radiation   Bone cancer Maternal Uncle        dx. 34s   Lung cancer Paternal Grandfather        d. 65s-90s; lung cancer vs TB   Colon cancer Sister        dx. 71-60; s/p surgery and radiation   Cancer Maternal Uncle        mother had about 7-8 other siblings, most passed at older ages, many of whom had some type of cancer   Heart attack Neg Hx    He indicated that his mother is deceased. He indicated that his father is deceased. He indicated that all of his three sisters are alive. He indicated that his brother is alive. He indicated that his maternal grandmother is deceased. He indicated that his maternal grandfather is deceased. He indicated that his paternal grandmother is deceased. He indicated that his paternal grandfather is deceased. He indicated that his daughter is alive. He indicated that his son is alive. He indicated that his maternal aunt is deceased. He indicated that both  of his maternal uncles are deceased. He indicated that his paternal aunt is deceased. He indicated that his paternal uncle is deceased. He indicated that his grandchild is alive. He indicated that the status of his neg hx is unknown.  Social History    Social History   Socioeconomic History   Marital status: Married    Spouse name: Ronnette Juniper   Number of children: Not on file   Years of education: Not on file   Highest education level: Not on file  Occupational History   Not on file  Tobacco Use   Smoking status: Former    Packs/day: 0.10    Years: 2.00    Total pack years: 0.20    Types: Cigarettes    Quit date: 12/16/1974    Years since quitting: 47.1   Smokeless tobacco: Never   Tobacco comments:    "1 pack cigarettes would last me a month"  Vaping Use   Vaping Use: Never used  Substance and Sexual Activity   Alcohol use: No   Drug use: No    Types: Cocaine, Marijuana    Comment: Hx polysubstance abuse, quit 2008   Sexual activity: Not Currently  Other Topics Concern   Not on file  Social History Narrative   Married, wife Ronnette Juniper (since 48)   Social Determinants of Health   Financial Resource Strain: Not on file  Food Insecurity: Not on file  Transportation Needs: Not on file  Physical Activity: Not on file  Stress: Not on file  Social Connections: Not on file  Intimate Partner Violence: Not on file     Review of Systems    General:  No chills, fever, night sweats or weight changes.  Cardiovascular:  No chest pain, dyspnea on exertion, edema, orthopnea, palpitations, paroxysmal nocturnal dyspnea. Dermatological: No rash, lesions/masses Respiratory: No cough, dyspnea Urologic: No hematuria, dysuria Abdominal:   No nausea, vomiting, diarrhea, bright red blood per rectum, melena, or hematemesis Neurologic:  No visual changes, wkns, changes in mental status. All other systems reviewed and are otherwise negative except as noted above.  Physical Exam    VS:   There were no vitals taken for this visit. , BMI There is no height or weight on file to calculate BMI. GEN: Well nourished, well developed, in no acute distress.  HEENT: normal. Neck: Supple, no JVD, carotid bruits, or masses. Cardiac: RRR, no murmurs, rubs, or gallops. No clubbing, cyanosis, edema.  Radials/DP/PT 2+ and equal bilaterally.  Respiratory:  Respirations regular and unlabored, clear to auscultation bilaterally. GI: Soft, nontender, nondistended, BS + x 4. MS: no deformity or atrophy. Skin: warm and dry, no rash. Neuro:  Strength and sensation are intact. Psych: Normal affect.  Accessory Clinical Findings    Recent Labs: 10/14/2021: ALT 14 10/18/2021: BUN 27; Creatinine, Ser 1.04; Hemoglobin 12.2; Magnesium 2.0; Platelets 181; Potassium 4.4; Sodium 137   Recent Lipid Panel    Component Value Date/Time   CHOL 125 10/10/2021 0434   TRIG 86 10/10/2021 0434   HDL 46 10/10/2021 0434   CHOLHDL 2.7 10/10/2021 0434   VLDL 17 10/10/2021 0434   LDLCALC 62 10/10/2021 0434    ECG personally reviewed by me today- *** - No acute changes  Echocardiogram 10/10/2021  IMPRESSIONS     1. Left ventricular ejection fraction, by estimation, is 60 to 65%. The  left ventricle has normal function. Left ventricular endocardial border  not optimally defined to evaluate regional wall motion. There is mild  concentric left ventricular  hypertrophy. Left ventricular diastolic function could not be evaluated.   2. Right ventricular systolic function is normal. The right ventricular  size is normal. There is normal pulmonary artery systolic pressure. The  estimated right ventricular systolic pressure is 29.7 mmHg.   3. Left atrial size was mildly dilated.   4. The mitral valve is grossly normal. No evidence of mitral valve  regurgitation. No evidence of mitral stenosis.   5. The aortic valve is tricuspid. Aortic valve regurgitation is not  visualized. No aortic stenosis is present.   6. The  inferior vena cava is dilated in size with >50% respiratory  variability, suggesting right atrial pressure of 8 mmHg.   Comparison(s): No significant change from prior study.   Conclusion(s)/Recommendation(s): No intracardiac source of embolism  detected on this transthoracic study. Consider a transesophageal  echocardiogram to exclude cardiac source of embolism if clinically  indicated.   FINDINGS   Left Ventricle: Left ventricular ejection fraction, by estimation, is 60  to 65%. The left ventricle has normal function. Left ventricular  endocardial border not optimally defined to evaluate regional wall motion.  The left ventricular internal cavity  size was normal in size. There is mild concentric left ventricular  hypertrophy. Left ventricular diastolic function could not be evaluated  due to atrial fibrillation. Left ventricular diastolic function could not  be evaluated.   Right Ventricle: The right ventricular size is normal. No increase in  right ventricular wall thickness. Right ventricular systolic function is  normal. There is normal pulmonary artery systolic pressure. The tricuspid  regurgitant velocity is 2.30 m/s, and   with an assumed right atrial pressure of 8 mmHg, the estimated right  ventricular systolic pressure is 98.9 mmHg.   Left Atrium: Left atrial size was mildly dilated.   Right Atrium: Right atrial size was normal in size.   Pericardium: There is no evidence of pericardial effusion.   Mitral Valve: The mitral valve is grossly normal. No evidence of mitral  valve regurgitation. No evidence of mitral valve stenosis.   Tricuspid Valve: The tricuspid valve is grossly normal. Tricuspid valve  regurgitation is not demonstrated. No evidence of tricuspid stenosis.   Aortic Valve: The aortic valve is tricuspid. Aortic valve regurgitation is  not visualized. No aortic stenosis is present.   Pulmonic Valve:  The pulmonic valve was grossly normal. Pulmonic valve   regurgitation is not visualized. No evidence of pulmonic stenosis.   Aorta: The aortic root is normal in size and structure.   Venous: The inferior vena cava is dilated in size with greater than 50%  respiratory variability, suggesting right atrial pressure of 8 mmHg.   IAS/Shunts: The atrial septum is grossly normal.  Assessment & Plan   1.  ***   Jossie Ng. Izza Bickle NP-C     02/09/2022, 9:47 AM [address] Office [phone] Fax [phone]  Notice: This dictation was prepared with Dragon dictation along with smaller phrase technology. Any transcriptional errors that result from this process are unintentional and may not be corrected upon review.  I spent***minutes examining this patient, reviewing medications, and using patient centered shared decision making involving her cardiac care.  Prior to her visit I spent greater than 20 minutes reviewing her past medical history,  medications, and prior cardiac tests.

## 2022-02-10 ENCOUNTER — Ambulatory Visit: Payer: Medicare HMO | Admitting: General Practice

## 2022-02-10 ENCOUNTER — Encounter: Payer: Self-pay | Admitting: General Practice

## 2022-02-10 ENCOUNTER — Other Ambulatory Visit: Payer: Self-pay

## 2022-02-10 VITALS — BP 136/82 | HR 83 | Ht 70.0 in | Wt 265.0 lb

## 2022-02-10 DIAGNOSIS — C182 Malignant neoplasm of ascending colon: Secondary | ICD-10-CM

## 2022-02-10 DIAGNOSIS — I10 Essential (primary) hypertension: Secondary | ICD-10-CM | POA: Diagnosis not present

## 2022-02-10 DIAGNOSIS — I5032 Chronic diastolic (congestive) heart failure: Secondary | ICD-10-CM

## 2022-02-10 DIAGNOSIS — I4821 Permanent atrial fibrillation: Secondary | ICD-10-CM | POA: Diagnosis not present

## 2022-02-10 NOTE — Patient Instructions (Signed)
Medication Instructions:  The current medical regimen is effective;  continue present plan and medications as directed. Please refer to the Current Medication list given to you today.   *If you need a refill on your cardiac medications before your next appointment, please call your pharmacy*  Lab Work:   Testing/Procedures:  NONE    NONE  Special Instructions PLEASE READ AND FOLLOW SALTY 6-ATTACHED-1,'800mg'$  daily  PLEASE INCREASE PHYSICAL ACTIVITY AS TOLERATED   MAY MELT POTASSIUM IN APPLESAUCE OR PUDDING  PLEASE PURCHASE AND WEAR COMPRESSION STOCKINGS DAILY AND TAKE OFF AT BEDTIME. Compression stockings are elastic socks that squeeze the legs. They help to increase blood flow to the legs and to decrease swelling in the legs from fluid retention, and reduce the chance of developing blood clots in the lower legs. Please put on in the AM when dressing and off at night when dressing for bed.  LET THEM KNOW THAT YOU NEED KNEE HIGH'S WITH COMPRESSION OF 15-20 mmhg.  [employer]  [place] ([place] [phone]); [place], [place] [phone]; [phone]  EMAIL   [email or web address].  PLEASE MAKE SURE TO ELEVATE YOUR FEET & LEGS WHILE SITTING, THIS WILL HELP WITH THE SWELLING ALSO.   Follow-Up: Your next appointment:  6 month(s) In Person with Pixie Casino, MD   Please call our office 2 months in advance to schedule this appointment  :1  At [facility], you and your health needs are our priority.  As part of our continuing mission to provide you with exceptional heart care, we have created designated Provider Care Teams.  These Care Teams include your primary Cardiologist (physician) and Advanced Practice Providers (APPs -  Physician Assistants and Nurse Practitioners) who all work together to provide you with the care you need, when you need it.  Important Information About Sugar             6 SALTY THINGS TO AVOID     1,'800MG'$  DAILY

## 2022-02-11 ENCOUNTER — Inpatient Hospital Stay: Payer: Medicare HMO | Attending: Hematology | Admitting: Hematology

## 2022-02-11 ENCOUNTER — Other Ambulatory Visit: Payer: Self-pay

## 2022-02-11 ENCOUNTER — Inpatient Hospital Stay: Payer: Medicare HMO

## 2022-02-11 VITALS — BP 119/69 | HR 73 | Temp 97.6°F | Resp 16 | Wt 263.6 lb

## 2022-02-11 DIAGNOSIS — E669 Obesity, unspecified: Secondary | ICD-10-CM | POA: Insufficient documentation

## 2022-02-11 DIAGNOSIS — C182 Malignant neoplasm of ascending colon: Secondary | ICD-10-CM

## 2022-02-11 DIAGNOSIS — I509 Heart failure, unspecified: Secondary | ICD-10-CM | POA: Diagnosis not present

## 2022-02-11 DIAGNOSIS — I110 Hypertensive heart disease with heart failure: Secondary | ICD-10-CM | POA: Diagnosis not present

## 2022-02-11 DIAGNOSIS — Z85038 Personal history of other malignant neoplasm of large intestine: Secondary | ICD-10-CM | POA: Diagnosis not present

## 2022-02-11 LAB — CBC WITH DIFFERENTIAL (CANCER CENTER ONLY)
Abs Immature Granulocytes: 0.06 10*3/uL (ref 0.00–0.07)
Basophils Absolute: 0.1 10*3/uL (ref 0.0–0.1)
Basophils Relative: 1 %
Eosinophils Absolute: 0.4 10*3/uL (ref 0.0–0.5)
Eosinophils Relative: 5 %
HCT: 42.6 % (ref 39.0–52.0)
Hemoglobin: 13.7 g/dL (ref 13.0–17.0)
Immature Granulocytes: 1 %
Lymphocytes Relative: 9 %
Lymphs Abs: 0.7 10*3/uL (ref 0.7–4.0)
MCH: 26.1 pg (ref 26.0–34.0)
MCHC: 32.2 g/dL (ref 30.0–36.0)
MCV: 81.3 fL (ref 80.0–100.0)
Monocytes Absolute: 0.6 10*3/uL (ref 0.1–1.0)
Monocytes Relative: 8 %
Neutro Abs: 5.8 10*3/uL (ref 1.7–7.7)
Neutrophils Relative %: 76 %
Platelet Count: 265 10*3/uL (ref 150–400)
RBC: 5.24 MIL/uL (ref 4.22–5.81)
RDW: 16.1 % — ABNORMAL HIGH (ref 11.5–15.5)
WBC Count: 7.6 10*3/uL (ref 4.0–10.5)
nRBC: 0 % (ref 0.0–0.2)

## 2022-02-11 LAB — CMP (CANCER CENTER ONLY)
ALT: 37 U/L (ref 0–44)
AST: 24 U/L (ref 15–41)
Albumin: 3.6 g/dL (ref 3.5–5.0)
Alkaline Phosphatase: 123 U/L (ref 38–126)
Anion gap: 4 — ABNORMAL LOW (ref 5–15)
BUN: 9 mg/dL (ref 8–23)
CO2: 33 mmol/L — ABNORMAL HIGH (ref 22–32)
Calcium: 8.9 mg/dL (ref 8.9–10.3)
Chloride: 101 mmol/L (ref 98–111)
Creatinine: 0.97 mg/dL (ref 0.61–1.24)
GFR, Estimated: >60 mL/min (ref >60)
Glucose, Bld: 84 mg/dL (ref 70–99)
Potassium: 4 mmol/L (ref 3.5–5.1)
Sodium: 138 mmol/L (ref 135–145)
Total Bilirubin: 1 mg/dL (ref 0.3–1.2)
Total Protein: 7.3 g/dL (ref 6.5–8.1)

## 2022-02-11 LAB — CEA (IN HOUSE-CHCC): CEA (CHCC-In House): 16.81 ng/mL — ABNORMAL HIGH (ref 0.00–5.00)

## 2022-02-11 NOTE — Progress Notes (Signed)
Steven Barnett   [phone] [phone]   Clinic Follow up Note   Patient Care Team: Mariel Sleet as PCP - General (Family Medicine) [name] [name], MD as PCP - Cardiology (Cardiology) [name], MD as Consulting Physician (General Surgery) Pixie Casino, MD as Consulting Physician (Cardiology)  Date of Service:  02/11/2022  CHIEF COMPLAINT: f/u of colon cancer  CURRENT THERAPY:  Surveillance  ASSESSMENT & PLAN:  Steven Barnett is a 71 y.o. male with   1. Right colon cancer, invasive adenocarcinoma, G3,  pT2N1M0, stage IIIA, MSI-H -He was diagnosed in 03/2016. He is s/p right hemicolectomy and adjuvant chemo FOLFOX. -GuardantReveal from 07/2020 was negative. -His last colonoscopy was 02/12/21 under Dr. [name], exam was benign. Repeat in 2027. -He is clinically doing well from a colon cancer standpoint. Labs reviewed, CBC and CMP WNL. CEA is pending. Physical exam unremarkable. There is no clinical concern for recurrence. -He is now almost 6 years from diagnosis. I discussed that his risk of recurrence is very low now that he is over 5 years out. -f/u open   2. HTN, OSA, CHF, obesity -f/u with PCP     Plan -f/u open   No problem-specific Assessment & Plan notes found for this encounter.   SUMMARY OF ONCOLOGIC HISTORY: Oncology History Overview Note  Cancer of right colon [facility])   Staging form: Colon and Rectum, AJCC 7th Edition   - Clinical stage from 04/12/2016: Stage IIIA (T2, N1, M0) - Signed by [name], MD on 05/01/2016    Cancer of right colon ([place])  03/04/2016 Procedure   COLONOSCOPY: A polypoid and ulcerated non-obstructing large mass was found in the cecum. The mass was noncircumferential. The mass measured three cm in length. In addition, its diameter measured three mm. (Dr. [name])    03/04/2016 Initial Biopsy   Diagnosis 1. Colon, biopsy, cecal - ADENOCARCINOMA. 2. Colon, polyp(s), transverse - BENIGN  COLONIC MUCOSA WITH BENIGN LYMPHOID AGGREGATE. - NO ADENOMATOUS CHANGE OR MALIGNANCY IDENTIFIED. Microscopic Comment 1. The biopsies are involved by moderately to poorly differentiated colorectal type adenocarcinoma.   03/04/2016 Initial Diagnosis   Cecal cancer ([place])   03/15/2016 Imaging   1. Polypoid cecal mass. No findings of liver metastatic disease or other definite metastatic disease. There are some small adjacent pericecal lymph nodes which are not pathologically enlarged. 2. Adenopathy in the chest is present but was also present in 2010, albeit slightly less prominent. This may be reactive adenopathy related to the chronic exudative right pleural effusion.    04/07/2016 Tumor Marker   Patient's tumor was tested for the following markers: CEA. Results of the tumor marker test revealed 2.7.   04/12/2016 Definitive Surgery   Laparoscopic right hemicolectomy (Dr. [name])   04/12/2016 Pathologic Stage   pT2 pN1 pMX--Grade 3 adenocarcinoma with 2/16 nodes positive, clear margins, negative for perineural or lymphvascular invasion; invades muscularis propria Loss of expression MLH1/PMS2   06/02/2016 - 09/13/2016 Adjuvant Chemotherapy    Ajuvant chemotherapy FOLFOX, every 2 weeks for 6 cycles (3 months), last cycle postponed due to hospitalization    06/29/2016 Genetic Testing   POLE c.4523G>A VUS identified on the Colorectal cancer panel.  Negative genetic testing for the MSH2 inversion analysis ([place] inversion). The Colorectal Cancer Panel offered by [facility] includes sequencing and/or duplication/deletion testing of the following 19 genes: APC, ATM, AXIN2, BMPR1A, CDH1, CHEK2, EPCAM, MLH1, MSH2, MSH6, MUTYH, PMS2, POLD1, POLE, PTEN, SCG5/GREM1, SMAD4, STK11, and TP53. The report date  is 06/22/2016 for the [facility] panel and 06/29/2016 for the [place] inversion.  POLE c.4523G>A VUS has been reclassified to a likely benign variant based on a combination of sources, e.g, internal data,  published literature, population databases and in silico models. The reclassification date is June 09, 2017.    08/17/2016 - 08/20/2016 Hospital Admission   Admit date: 08/17/2016 Admission diagnosis: Acute respiratory failure with hypoxia  Additional comments: Patient was admitted initially with hypoxia and found to have H influenza positive-started on Tamiflu and broad-spectrum antibiotics were D escalate. He was hypotensive on admission given IV saline 75 cc per hour saline lock 2/16 and antihypertensives which were held including lisinopril and Lasix and hydralazine on discharge. Joint hospitalization he continued on his metoprolol twice a day   01/20/2017 Imaging   CT CAP W Contrast 01/20/17 IMPRESSION: 1. No evidence of local tumor recurrence at the ileocolic anastomosis . 2. No findings suspicious for metastatic disease in the chest, abdomen or pelvis. 3. Mild mediastinal lymphadenopathy is stable since 2010 and considered benign . 4. Stable morphologic changes suggestive of cirrhosis. 5. Stable patulous fluid-filled thoracic esophagus. Patchy ground-glass opacity in the posterior right upper lobe is probably inflammatory, possibly due to aspiration. 6. Stable chronic small loculated dependent right pleural effusion.    09/15/2017 Pathology Results   09/15/2017 Surgical Pathology Diagnosis Colon, polyp(s), sigmoid - TUBULAR ADENOMA. - NO HIGH GRADE DYSPLASIA OR MALIGNANCY.   01/15/2018 Imaging   01/15/2018 CT CAP W Contrast IMPRESSION: 1. No findings identified to suggest local tumor recurrence at the ileocolic anastomosis. No evidence for distant metastatic disease. 2. Stable mild mediastinal lymphadenopathy since 2010 and considered benign. 3. Similar appearance of patulous fluid-filled thoracic esophagus. 4. Unchanged right posterior pleural thickening with loculated pleural fluid. 5.  Aortic Atherosclerosis (ICD10-I70.0).   01/15/2018 Tumor Marker   CEA: 04/07/16:  2.7 04/27/2016: 2.11 07/14/2016: 4.25 10/21/2016: 3.60 01/16/17: 3.76 04/25/2017: 5.16 08/28/2017: 2.06 01/15/2018: 2.90    01/28/2019 Imaging   CT CAP W Contrast  IMPRESSION: Stable exam. No evidence of recurrent or metastatic carcinoma. No acute findings.      INTERVAL HISTORY:  Steven Barnett is here for a follow up of colon cancer. He was last seen by me on 05/11/20. He presents to the clinic accompanied by his daughter. He reports he had a stroke and seizure since his last visit. He notes he is recovering well now. He denies any stomach issues. He lost his wife to lung cancer last year.   All other systems were reviewed with the patient and are negative.  MEDICAL HISTORY:  Past Medical History:  Diagnosis Date   Anemia    Arthralgia of both knees    Arthritis    "right knee" (10/01/2015)   Atrial fibrillation (HCC)    Cancer ([place])    STAGE 1 COLON CANCER   Chronic anticoagulation 2010   Coumadin   Chronic diastolic CHF (congestive heart failure), NYHA class 2 ([place])    Dyspnea    Dysrhythmia    Hypertension    OSA (obstructive sleep apnea) 2016   "couldn't take the mask during the testing" (10/01/2015)   Permanent atrial fibrillation ([place]) 2010   Pneumonia 3/29/2017and june 2017    SURGICAL HISTORY: Past Surgical History:  Procedure Laterality Date   COLONOSCOPY WITH PROPOFOL N/A 03/04/2016   Procedure: COLONOSCOPY WITH PROPOFOL;  Surgeon: Carol Ada, MD;  Location: [facility];  Service: Endoscopy;  Laterality: N/A;   COLONOSCOPY WITH PROPOFOL N/A 09/15/2017   Procedure:  COLONOSCOPY WITH PROPOFOL;  Surgeon: Carol Ada, MD;  Location: [facility];  Service: Endoscopy;  Laterality: N/A;   COLONOSCOPY WITH PROPOFOL N/A 02/12/2021   Procedure: COLONOSCOPY WITH PROPOFOL;  Surgeon: Carol Ada, MD;  Location: [facility];  Service: Endoscopy;  Laterality: N/A;   LAPAROSCOPIC PARTIAL COLECTOMY N/A 04/12/2016   Procedure: LAPAROSCOPIC ILEOCOLECTOMY;  Surgeon: [name], MD;  Location: [place];  Service: General;  Laterality: N/A;   PORT-A-CATH REMOVAL N/A 02/16/2017   Procedure: REMOVAL PORT-A-CATH;  Surgeon: [name], MD;  Location: [place];  Service: General;  Laterality: N/A;   PORTACATH PLACEMENT N/A 05/18/2016   Procedure: INSERTION PORT-A-CATH;  Surgeon: [name], MD;  Location: [place];  Service: General;  Laterality: N/A;   THORACOTOMY Right 2010    I have reviewed the social history and family history with the patient and they are unchanged from previous note.  ALLERGIES:  has No Known Allergies.  MEDICATIONS:  Current Outpatient Medications  Medication Sig Dispense Refill   acetaminophen (TYLENOL) 500 MG tablet Take 1 tablet (500 mg total) by mouth every 6 (six) hours as needed. Take 1 tablet 500 mg 3 times daily x5 days, then every 6 hours as needed.  0   albuterol (PROVENTIL HFA;VENTOLIN HFA) 108 (90 Base) MCG/ACT inhaler Inhale 2 puffs into the lungs every 6 (six) hours as needed for wheezing or shortness of breath. 1 Inhaler 2   allopurinol (ZYLOPRIM) 100 MG tablet Take 1 tablet (100 mg total) by mouth daily. 30 tablet 1   apixaban (ELIQUIS) 5 MG TABS tablet Take 5 mg by mouth in the morning and at bedtime.     aspirin EC 81 MG tablet Take 81 mg by mouth every morning.     Colchicine 0.6 MG CAPS Take 0.6 mg by mouth 2 (two) times daily.     diclofenac Sodium (VOLTAREN) 1 % GEL Apply 2 g topically 4 (four) times daily as needed (for joint pain). 100 g 0   diltiazem (CARDIZEM) 120 MG tablet Take 120 mg by mouth every 8 (eight) hours.     diltiazem (CARDIZEM) 60 MG tablet Take 2 tablets (120 mg total) by mouth daily. 60 tablet 1   furosemide (LASIX) 40 MG tablet TAKE 1 TABLET EVERY DAY (Patient not taking: Reported on 02/10/2022) 90 tablet 3   furosemide (LASIX) 80 MG tablet Take 1 tablet by mouth in the morning and at bedtime.     levETIRAcetam (KEPPRA) 500 MG tablet Take 1 tablet (500 mg total) by mouth 2 (two)  times daily. 60 tablet 2   lisinopril (ZESTRIL) 5 MG tablet Take 5 mg by mouth daily.     MAGNESIUM-OXIDE 400 (240 Mg) MG tablet Take 1 tablet by mouth 2 (two) times daily.     pantoprazole (PROTONIX) 40 MG tablet Take 1 tablet (40 mg total) by mouth daily. 30 tablet 1   polyethylene glycol powder (GLYCOLAX/MIRALAX) 17 GM/SCOOP powder Take 1 capful (17 g) with water by mouth daily. (Patient not taking: Reported on 02/10/2022) 238 g 1   potassium chloride (KLOR-CON) 10 MEQ tablet Take 1 tablet (10 mEq total) by mouth daily. (Patient not taking: Reported on 02/10/2022) 90 tablet 3   potassium chloride SA (KLOR-CON M) 20 MEQ tablet Take 20 mEq by mouth 2 (two) times daily.     senna-docusate (SENOKOT-S) 8.6-50 MG tablet Take 1 tablet by mouth 2 (two) times daily.     TURMERIC PO Take 1,000 mg by mouth every morning.  No current facility-administered medications for this visit.   Facility-Administered Medications Ordered in Other Visits  Medication Dose Route Frequency Provider Last Rate Last Admin   heparin lock flush 100 unit/mL  500 Units Intravenous Once PRN [name], MD       sodium chloride flush (NS) 0.9 % injection 10 mL  10 mL Intravenous PRN [name], MD        PHYSICAL EXAMINATION: ECOG PERFORMANCE STATUS: {CHL ONC ECOG TK:[id number]}  Vitals:   02/11/22 1451  BP: 119/69  Pulse: 73  Resp: 16  Temp: 97.6 F (36.4 C)  SpO2: 100%   Wt Readings from Last 3 Encounters:  02/11/22 263 lb 9 oz (119.6 kg)  02/10/22 265 lb (120.2 kg)  04/14/21 280 lb (127 kg)     GENERAL:alert, no distress and comfortable SKIN: skin color, texture, turgor are normal, no rashes or significant lesions EYES: normal, Conjunctiva are pink and non-injected, sclera clear LUNGS: (+) cough, (+) *** HEART: regular rate & rhythm and no murmurs, (+) mild lower extremity edema Musculoskeletal:no cyanosis of digits and no clubbing  NEURO: alert & oriented x 3 with fluent speech, no focal motor/sensory  deficits  LABORATORY DATA:  I have reviewed the data as listed    Latest Ref Rng & Units 02/11/2022    2:16 PM 10/18/2021    1:36 AM 10/16/2021    1:53 AM  CBC  WBC 4.0 - 10.5 K/uL 7.6  9.0  9.5   Hemoglobin 13.0 - 17.0 g/dL 13.7  12.2  12.3   Hematocrit 39.0 - 52.0 % 42.6  37.0  37.3   Platelets 150 - 400 K/uL 265  181  150         Latest Ref Rng & Units 02/11/2022    2:16 PM 10/18/2021    1:36 AM 10/17/2021    3:22 AM  CMP  Glucose 70 - 99 mg/dL 84  100  88   BUN 8 - 23 mg/dL _0 Creatinine 0.61 - 1.24 mg/dL 0.97  1.04  0.95   Sodium 135 - 145 mmol/L 138  137  134   Potassium 3.5 - 5.1 mmol/L 4.0  4.4  4.5   Chloride 98 - 111 mmol/L 101  106  107   CO2 22 - 32 mmol/L 33  22  21   Calcium 8.9 - 10.3 mg/dL 8.9  8.6  8.5   Total Protein 6.5 - 8.1 g/dL 7.3     Total Bilirubin 0.3 - 1.2 mg/dL 1.0     Alkaline Phos 38 - 126 U/L 123     AST 15 - 41 U/L 24     ALT 0 - 44 U/L 37         RADIOGRAPHIC STUDIES: I have personally reviewed the radiological images as listed and agreed with the findings in the report. No results found.    No orders of the defined types were placed in this encounter.  All questions were answered. The patient knows to call the clinic with any problems, questions or concerns. No barriers to learning was detected. The total time spent in the appointment was {CHL ONC TIME VISIT - [id number]}.     [name], MD 02/11/2022   I, Wilburn Mylar, am acting as scribe for [name], MD.   {Add scribe attestation statement}

## 2022-02-12 ENCOUNTER — Encounter: Payer: Self-pay | Admitting: Hematology

## 2022-04-07 ENCOUNTER — Other Ambulatory Visit: Payer: Self-pay

## 2022-04-07 ENCOUNTER — Telehealth: Payer: Self-pay | Admitting: Internal Medicine

## 2022-04-07 MED ORDER — APIXABAN 5 MG PO TABS: 5.0000 mg | ORAL_TABLET | Freq: Two times a day (BID) | ORAL | 1 refills | Status: DC

## 2022-04-07 NOTE — Telephone Encounter (Signed)
 *  STAT* If patient is at the pharmacy, call can be transferred to refill team.   1. Which medications need to be refilled? (please list name of each medication and dose if known)   apixaban (ELIQUIS) 5 MG TABS tablet  2. Which pharmacy/location (including street and city if local pharmacy) is medication to be sent to?  [facility] - [place], [place] - [address]  3. Do they need a 30 day or 90 day supply?    Patient is completely out of medication

## 2022-04-07 NOTE — Telephone Encounter (Signed)
Prescription refill request for Eliquis received. Indication: AF Last office visit: 02/10/22  Coletta Memos NP Scr: 0.97 on 02/11/22 Age:  71 Weight: 120.2kg  Based on above findings Eliquis '5mg'$  twice daily is the appropriate dose.  Refill approved.

## 2022-04-07 NOTE — Telephone Encounter (Signed)
Rx sent earlier this morning. °

## 2022-04-07 NOTE — Telephone Encounter (Signed)
*  STAT* If patient is at the pharmacy, call can be transferred to refill team.   1. Which medications need to be refilled? (please list name of each medication and dose if known) apixaban (ELIQUIS) 5 MG TABS tablet  2. Which pharmacy/location (including street and city if local pharmacy) is medication to be sent to? [facility] - [place], [place] - [address]  3. Do they need a 30 day or 90 day supply? [place]

## 2022-06-07 ENCOUNTER — Other Ambulatory Visit (HOSPITAL_COMMUNITY): Payer: Self-pay

## 2022-06-21 ENCOUNTER — Ambulatory Visit: Payer: Medicare HMO | Admitting: Family

## 2022-06-21 ENCOUNTER — Encounter: Payer: Self-pay | Admitting: Family

## 2022-06-21 VITALS — BP 134/86 | HR 103 | Ht 70.0 in | Wt 257.4 lb

## 2022-06-21 DIAGNOSIS — M109 Gout, unspecified: Secondary | ICD-10-CM | POA: Diagnosis not present

## 2022-06-21 DIAGNOSIS — E876 Hypokalemia: Secondary | ICD-10-CM

## 2022-06-21 DIAGNOSIS — K219 Gastro-esophageal reflux disease without esophagitis: Secondary | ICD-10-CM | POA: Diagnosis not present

## 2022-06-21 DIAGNOSIS — R569 Unspecified convulsions: Secondary | ICD-10-CM

## 2022-06-21 MED ORDER — DICLOFENAC SODIUM 1 % EX GEL: 2.0000 g | Freq: Four times a day (QID) | CUTANEOUS | 0 refills | Status: DC | PRN

## 2022-06-21 MED ORDER — ALBUTEROL SULFATE HFA 108 (90 BASE) MCG/ACT IN AERS: 2.0000 | INHALATION_SPRAY | Freq: Four times a day (QID) | RESPIRATORY_TRACT | 2 refills | Status: AC | PRN

## 2022-06-21 MED ORDER — PANTOPRAZOLE SODIUM 40 MG PO TBEC: 40.0000 mg | DELAYED_RELEASE_TABLET | Freq: Every day | ORAL | 1 refills | Status: DC

## 2022-06-21 MED ORDER — COLCHICINE 0.6 MG PO CAPS: 0.6000 mg | ORAL_CAPSULE | Freq: Every day | ORAL | 3 refills | Status: DC

## 2022-06-21 MED ORDER — PREDNISONE 20 MG PO TABS: 40.0000 mg | ORAL_TABLET | Freq: Every day | ORAL | 0 refills | Status: DC

## 2022-06-21 MED ORDER — HYDROCODONE-ACETAMINOPHEN 5-325 MG PO TABS: 1.0000 | ORAL_TABLET | Freq: Four times a day (QID) | ORAL | 0 refills | Status: DC | PRN

## 2022-06-21 NOTE — Progress Notes (Signed)
Steven Barnett is a 71 y.o. male with the following history as recorded in EpicCare:  Patient Active Problem List   Diagnosis Date Noted   Constipation 10/16/2021   Atrial fibrillation with rapid ventricular response ([place]) 10/13/2021   Hyponatremia 10/13/2021   Gout 10/13/2021   Hypertensive emergency    Seizure ([place])    [place] (intracerebral hemorrhage) ([place]) 10/09/2021   Hypovolemia 01/14/2021   Hypokalemia 01/14/2021   Hypomagnesemia 01/14/2021   Normocytic anemia 01/14/2021   Gait instability 01/14/2021   Hypotension 08/17/2016   SIRS (systemic inflammatory response syndrome) ([place]) 08/17/2016   Influenza A 08/17/2016   Septic shock ([place]) 08/10/2016   Neutropenia, drug-induced ([place])    Gastroenteritis due to norovirus    Acute kidney injury ([place])    Genetic testing 07/01/2016   Port catheter in place 06/29/2016   Family history of colon cancer 06/08/2016   Family history of colonic polyps 06/08/2016   Cancer of right colon ([place]) 04/12/2016   Pre-operative cardiovascular examination 04/06/2016   Acute on chronic diastolic heart failure ([place]) 02/02/2016   Acute respiratory failure with hypoxia ([place]) 02/02/2016   Anemia, iron deficiency 02/02/2016   Current use of long term anticoagulation 12/03/2015   Pneumonia 09/30/2015   PNA (pneumonia) 09/30/2015   Acute on chronic congestive heart failure (HCC)    CHF exacerbation ([place]) 07/08/2015   Community acquired pneumonia 02/10/2015   ED (erectile dysfunction) 08/12/2014   Arthralgia of both knees 04/08/2014   Chronic diastolic (congestive) heart failure ([place]) 01/14/2009   Morbid obesity ([place]) 12/31/2008   Obstructive sleep apnea 12/31/2008   Essential hypertension, benign 12/31/2008   Atrial fibrillation ([place]) 12/31/2008    Current Outpatient Medications  Medication Sig Dispense Refill   acetaminophen (TYLENOL) 500 MG tablet Take 1 tablet (500 mg total) by mouth every 6 (six) hours as needed. Take 1 tablet 500 mg 3 times  daily x5 days, then every 6 hours as needed.  0   allopurinol (ZYLOPRIM) 100 MG tablet Take 1 tablet (100 mg total) by mouth daily. 30 tablet 1   apixaban (ELIQUIS) 5 MG TABS tablet Take 1 tablet (5 mg total) by mouth in the morning and at bedtime. 180 tablet 1   aspirin EC 81 MG tablet Take 81 mg by mouth every morning.     diltiazem (CARDIZEM) 120 MG tablet Take 120 mg by mouth every 8 (eight) hours.     furosemide (LASIX) 40 MG tablet TAKE 1 TABLET EVERY DAY 90 tablet 3   HYDROcodone-acetaminophen (NORCO) 5-325 MG tablet Take 1 tablet by mouth every 6 (six) hours as needed for moderate pain or severe pain. 15 tablet 0   levETIRAcetam (KEPPRA) 500 MG tablet Take 1 tablet (500 mg total) by mouth 2 (two) times daily. 60 tablet 2   lisinopril (ZESTRIL) 5 MG tablet Take 5 mg by mouth daily.     MAGNESIUM-OXIDE 400 (240 Mg) MG tablet Take 1 tablet by mouth 2 (two) times daily.     polyethylene glycol powder (GLYCOLAX/MIRALAX) 17 GM/SCOOP powder Measure  1 capful (17 g) & mix with 4 oz of water and drink by mouth daily. 238 g 1   potassium chloride (KLOR-CON) 10 MEQ tablet Take 1 tablet (10 mEq total) by mouth daily. 90 tablet 3   predniSONE (DELTASONE) 20 MG tablet Take 2 tablets (40 mg total) by mouth daily with breakfast. 10 tablet 0   senna-docusate (SENOKOT-S) 8.6-50 MG tablet Take 1 tablet by mouth 2 (two) times daily.  TURMERIC PO Take 1,000 mg by mouth every morning.     albuterol (VENTOLIN HFA) 108 (90 Base) MCG/ACT inhaler Inhale 2 puffs into the lungs every 6 (six) hours as needed for wheezing or shortness of breath. 1 each 2   Colchicine 0.6 MG CAPS Take 0.6 mg by mouth daily. 60 capsule 3   diclofenac Sodium (VOLTAREN) 1 % GEL Apply 2 g topically 4 (four) times daily as needed (for joint pain). 100 g 0   pantoprazole (PROTONIX) 40 MG tablet Take 1 tablet (40 mg total) by mouth daily. 30 tablet 1   No current facility-administered medications for this visit.    Allergies: Patient has  no known allergies.  Past Medical History:  Diagnosis Date   Anemia    Arthralgia of both knees    Arthritis    "right knee" (10/01/2015)   Atrial fibrillation ([place])    Cancer ([place])    STAGE 1 COLON CANCER   Chronic anticoagulation 2010   Coumadin   Chronic diastolic CHF (congestive heart failure), NYHA class 2 ([place])    Dyspnea    Dysrhythmia    Hypertension    OSA (obstructive sleep apnea) 2016   "couldn't take the mask during the testing" (10/01/2015)   Permanent atrial fibrillation ([place]) 2010   Pneumonia 3/29/2017and june 2017    Past Surgical History:  Procedure Laterality Date   COLONOSCOPY WITH PROPOFOL N/A 03/04/2016   Procedure: COLONOSCOPY WITH PROPOFOL;  Surgeon: Carol Ada, MD;  [facility];  Service: Endoscopy;  Laterality: N/A;   COLONOSCOPY WITH PROPOFOL N/A 09/15/2017   Procedure: COLONOSCOPY WITH PROPOFOL;  Surgeon: Carol Ada, MD;  [facility];  Service: Endoscopy;  Laterality: N/A;   COLONOSCOPY WITH PROPOFOL N/A 02/12/2021   Procedure: COLONOSCOPY WITH PROPOFOL;  Surgeon: Carol Ada, MD;  [facility];  Service: Endoscopy;  Laterality: N/A;   LAPAROSCOPIC PARTIAL COLECTOMY N/A 04/12/2016   Procedure: LAPAROSCOPIC ILEOCOLECTOMY;  Surgeon: [name], MD;  Location: [facility];  Service: General;  Laterality: N/A;   PORT-A-CATH REMOVAL N/A 02/16/2017   Procedure: REMOVAL PORT-A-CATH;  Surgeon: [name], MD;  Location: [facility];  Service: General;  Laterality: N/A;   PORTACATH PLACEMENT N/A 05/18/2016   Procedure: INSERTION PORT-A-CATH;  Surgeon: [name], MD;  Location: [facility];  Service: General;  Laterality: N/A;   THORACOTOMY Right 2010    Family History  Problem Relation Age of Onset   Heart disease Father    Heart failure Father    Colon polyps Father        unspecified number - pt of intestine surgically resected   Hypertension Mother    Diabetes Mother    Hyperlipidemia Mother    Colon polyps  Mother        unspecified number - pt of intestine surgically resected   Dementia Mother        d. 36   Stroke Maternal Grandmother    Stroke Sister    Colon cancer Sister 57       w/ "cancerous polyps" - unspecified number; underwent surgery and radiation   Colon polyps Brother        unspecified number - polypectomies   Colon cancer Sister        dx 11-60; s/p surgery and radiation   Bone cancer Maternal Uncle        dx. 72s   Lung cancer Paternal Grandfather        d. 17s-90s; lung cancer vs TB  Colon cancer Sister        dx. 43-60; s/p surgery and radiation   Cancer Maternal Uncle        mother had about 7-8 other siblings, most passed at older ages, many of whom had some type of cancer   Heart attack Neg Hx     Social History   Tobacco Use   Smoking status: Former    Packs/day: 0.10    Years: 2.00    Total pack years: 0.20    Types: Cigarettes    Quit date: 12/16/1974    Years since quitting: 47.5   Smokeless tobacco: Never   Tobacco comments:    "1 pack cigarettes would last me a month"  Substance Use Topics   Alcohol use: No    Subjective:   Presents today as a new patient; having acute gout flare in his right hand for the past 2-3 days- significantly worse pain in the past 24 hours; accompanied by his daughter who helps provide history; typically takes Allopurinol and Colchicine for prevention; has been off Colchicine for at least the past 1 week and feels this is what triggered his flare;   Sees cardiology through [facility]; history of seizure disorder- initial seizure in July 2023- per daughter, scheduled to see neurology early next year but unsure about appointment information. No records of outpatient follow up are available for review;   Objective:  Vitals:   06/21/22 1026  BP: 134/86  Pulse: (!) 103  SpO2: 99%  Weight: 257 lb 6.4 oz (116.8 kg)  Height: '5\' 10"'$  (1.778 m)    General: Well developed, well nourished, in mild distress  Skin : Warm and dry.  Warmth, swelling noted across top of right hand Head: Normocephalic and atraumatic  Eyes: Sclera and conjunctiva clear; pupils round and reactive to light; extraocular movements intact  Lungs: Respirations unlabored; clear to auscultation bilaterally without wheeze, rales, rhonchi  CVS exam: normal rate and regular rhythm.  Vessels: Symmetric bilaterally  Neurologic: Alert and oriented; speech intact; face symmetrical; moves all extremities well; CNII-XII intact without focal deficit   Assessment:  1. Acute gout of right hand, unspecified cause   2. Hypokalemia   3. Seizure ([place])   4. Gastroesophageal reflux disease, unspecified whether esophagitis present     Plan:  Will treat for acute gout flare with prednisone and Norco today; refill updated on Colchicine; plan to re-check uric acid level in about 3 weeks- to consider increased dosage of Allopurinol; patient has been taking Colchicine daily; will need to evaluate his treatment regimen due to need for daily Colchicine. Referral to neurology- does not look like any follow up occurred as previously scheduled;  Refill updated for Protonix;   Will plan for labs at next OV to include potassium, magnesium, uric acid;   Return in about 3 weeks (around 07/12/2022).  No orders of the defined types were placed in this encounter.   Requested Prescriptions   Signed Prescriptions Disp Refills   albuterol (VENTOLIN HFA) 108 (90 Base) MCG/ACT inhaler 1 each 2    Sig: Inhale 2 puffs into the lungs every 6 (six) hours as needed for wheezing or shortness of breath.   Colchicine 0.6 MG CAPS 60 capsule 3    Sig: Take 0.6 mg by mouth daily.   pantoprazole (PROTONIX) 40 MG tablet 30 tablet 1    Sig: Take 1 tablet (40 mg total) by mouth daily.   diclofenac Sodium (VOLTAREN) 1 % GEL 100 g 0  Sig: Apply 2 g topically 4 (four) times daily as needed (for joint pain).   predniSONE (DELTASONE) 20 MG tablet 10 tablet 0    Sig: Take 2 tablets (40 mg total)  by mouth daily with breakfast.   HYDROcodone-acetaminophen (NORCO) 5-325 MG tablet 15 tablet 0    Sig: Take 1 tablet by mouth every 6 (six) hours as needed for moderate pain or severe pain.

## 2022-06-25 ENCOUNTER — Other Ambulatory Visit (HOSPITAL_BASED_OUTPATIENT_CLINIC_OR_DEPARTMENT_OTHER): Payer: Self-pay | Admitting: Family

## 2022-06-25 DIAGNOSIS — I5032 Chronic diastolic (congestive) heart failure: Secondary | ICD-10-CM

## 2022-06-28 NOTE — Telephone Encounter (Signed)
Rx(s) sent to pharmacy electronically.  

## 2022-06-30 ENCOUNTER — Other Ambulatory Visit: Payer: Self-pay

## 2022-06-30 DIAGNOSIS — I5032 Chronic diastolic (congestive) heart failure: Secondary | ICD-10-CM

## 2022-07-12 ENCOUNTER — Ambulatory Visit: Payer: Medicare HMO | Admitting: Family

## 2022-07-12 ENCOUNTER — Ambulatory Visit (INDEPENDENT_AMBULATORY_CARE_PROVIDER_SITE_OTHER): Payer: Medicare HMO | Admitting: Family

## 2022-07-12 ENCOUNTER — Encounter: Payer: Self-pay | Admitting: Family

## 2022-07-12 VITALS — BP 132/82 | HR 73 | Resp 18 | Ht 70.0 in | Wt 263.2 lb

## 2022-07-12 DIAGNOSIS — R790 Abnormal level of blood mineral: Secondary | ICD-10-CM | POA: Diagnosis not present

## 2022-07-12 DIAGNOSIS — I5032 Chronic diastolic (congestive) heart failure: Secondary | ICD-10-CM

## 2022-07-12 DIAGNOSIS — M1A9XX0 Chronic gout, unspecified, without tophus (tophi): Secondary | ICD-10-CM | POA: Diagnosis not present

## 2022-07-12 DIAGNOSIS — M109 Gout, unspecified: Secondary | ICD-10-CM

## 2022-07-12 DIAGNOSIS — R569 Unspecified convulsions: Secondary | ICD-10-CM

## 2022-07-12 NOTE — Progress Notes (Signed)
Steven Barnett is a 72 y.o. male with the following history as recorded in EpicCare:  Patient Active Problem List   Diagnosis Date Noted   Constipation 10/16/2021   Atrial fibrillation with rapid ventricular response ([place]) 10/13/2021   Hyponatremia 10/13/2021   Gout 10/13/2021   Hypertensive emergency    Seizure ([place])    [employer] (intracerebral hemorrhage) ([place]) 10/09/2021   Hypovolemia 01/14/2021   Hypokalemia 01/14/2021   Hypomagnesemia 01/14/2021   Normocytic anemia 01/14/2021   Gait instability 01/14/2021   Hypotension 08/17/2016   SIRS (systemic inflammatory response syndrome) ([place]) 08/17/2016   Influenza A 08/17/2016   Septic shock ([place]) 08/10/2016   Neutropenia, drug-induced ([place])    Gastroenteritis due to norovirus    Acute kidney injury ([place])    Genetic testing 07/01/2016   Port catheter in place 06/29/2016   Family history of colon cancer 06/08/2016   Family history of colonic polyps 06/08/2016   Cancer of right colon ([place]) 04/12/2016   Pre-operative cardiovascular examination 04/06/2016   Acute on chronic diastolic heart failure ([place]) 02/02/2016   Acute respiratory failure with hypoxia ([place]) 02/02/2016   Anemia, iron deficiency 02/02/2016   Current use of long term anticoagulation 12/03/2015   Pneumonia 09/30/2015   PNA (pneumonia) 09/30/2015   Acute on chronic congestive heart failure (HCC)    CHF exacerbation ([place]) 07/08/2015   Community acquired pneumonia 02/10/2015   ED (erectile dysfunction) 08/12/2014   Arthralgia of both knees 04/08/2014   Chronic diastolic (congestive) heart failure ([place]) 01/14/2009   Morbid obesity ([place]) 12/31/2008   Obstructive sleep apnea 12/31/2008   Essential hypertension, benign 12/31/2008   Atrial fibrillation ([place]) 12/31/2008    Current Outpatient Medications  Medication Sig Dispense Refill   acetaminophen (TYLENOL) 500 MG tablet Take 1 tablet (500 mg total) by mouth every 6 (six) hours as needed. Take 1 tablet 500 mg 3 times  daily x5 days, then every 6 hours as needed.  0   albuterol (VENTOLIN HFA) 108 (90 Base) MCG/ACT inhaler Inhale 2 puffs into the lungs every 6 (six) hours as needed for wheezing or shortness of breath. 1 each 2   allopurinol (ZYLOPRIM) 100 MG tablet Take 1 tablet (100 mg total) by mouth daily. 30 tablet 1   apixaban (ELIQUIS) 5 MG TABS tablet Take 1 tablet (5 mg total) by mouth in the morning and at bedtime. 180 tablet 1   aspirin EC 81 MG tablet Take 81 mg by mouth every morning.     Colchicine 0.6 MG CAPS Take 0.6 mg by mouth daily. 60 capsule 3   diclofenac Sodium (VOLTAREN) 1 % GEL Apply 2 g topically 4 (four) times daily as needed (for joint pain). 100 g 0   diltiazem (CARDIZEM) 120 MG tablet Take 120 mg by mouth every 8 (eight) hours.     furosemide (LASIX) 40 MG tablet TAKE 1 TABLET EVERY DAY 90 tablet 1   levETIRAcetam (KEPPRA) 500 MG tablet Take 1 tablet (500 mg total) by mouth 2 (two) times daily. 60 tablet 2   lisinopril (ZESTRIL) 5 MG tablet Take 5 mg by mouth daily.     MAGNESIUM-OXIDE 400 (240 Mg) MG tablet Take 1 tablet by mouth 2 (two) times daily.     pantoprazole (PROTONIX) 40 MG tablet Take 1 tablet (40 mg total) by mouth daily. 30 tablet 1   polyethylene glycol powder (GLYCOLAX/MIRALAX) 17 GM/SCOOP powder Measure  1 capful (17 g) & mix with 4 oz of water and drink by mouth daily.  238 g 1   potassium chloride (KLOR-CON) 10 MEQ tablet Take 1 tablet (10 mEq total) by mouth daily. 90 tablet 3   senna-docusate (SENOKOT-S) 8.6-50 MG tablet Take 1 tablet by mouth 2 (two) times daily.     TURMERIC PO Take 1,000 mg by mouth every morning.     No current facility-administered medications for this visit.    Allergies: Patient has no known allergies.  Past Medical History:  Diagnosis Date   Anemia    Arthralgia of both knees    Arthritis    "right knee" (10/01/2015)   Atrial fibrillation ([place])    Cancer ([place])    STAGE 1 COLON CANCER   Chronic anticoagulation 2010   Coumadin    Chronic diastolic CHF (congestive heart failure), NYHA class 2 ([place])    Dyspnea    Dysrhythmia    Hypertension    OSA (obstructive sleep apnea) 2016   "couldn't take the mask during the testing" (10/01/2015)   Permanent atrial fibrillation ([place]) 2010   Pneumonia 3/29/2017and june 2017    Past Surgical History:  Procedure Laterality Date   COLONOSCOPY WITH PROPOFOL N/A 03/04/2016   Procedure: COLONOSCOPY WITH PROPOFOL;  Surgeon: Carol Ada, MD;  [facility];  Service: Endoscopy;  Laterality: N/A;   COLONOSCOPY WITH PROPOFOL N/A 09/15/2017   Procedure: COLONOSCOPY WITH PROPOFOL;  Surgeon: Carol Ada, MD;  [facility];  Service: Endoscopy;  Laterality: N/A;   COLONOSCOPY WITH PROPOFOL N/A 02/12/2021   Procedure: COLONOSCOPY WITH PROPOFOL;  Surgeon: Carol Ada, MD;  [facility];  Service: Endoscopy;  Laterality: N/A;   LAPAROSCOPIC PARTIAL COLECTOMY N/A 04/12/2016   Procedure: LAPAROSCOPIC ILEOCOLECTOMY;  Surgeon: [name], MD;  Location: [facility];  Service: General;  Laterality: N/A;   PORT-A-CATH REMOVAL N/A 02/16/2017   Procedure: REMOVAL PORT-A-CATH;  Surgeon: [name], MD;  Location: [facility];  Service: General;  Laterality: N/A;   PORTACATH PLACEMENT N/A 05/18/2016   Procedure: INSERTION PORT-A-CATH;  Surgeon: [name], MD;  Location: [facility];  Service: General;  Laterality: N/A;   THORACOTOMY Right 2010    Family History  Problem Relation Age of Onset   Heart disease Father    Heart failure Father    Colon polyps Father        unspecified number - pt of intestine surgically resected   Hypertension Mother    Diabetes Mother    Hyperlipidemia Mother    Colon polyps Mother        unspecified number - pt of intestine surgically resected   Dementia Mother        d. 58   Stroke Maternal Grandmother    Stroke Sister    Colon cancer Sister 60       w/ "cancerous polyps" - unspecified number; underwent surgery and  radiation   Colon polyps Brother        unspecified number - polypectomies   Colon cancer Sister        dx 33-60; s/p surgery and radiation   Bone cancer Maternal Uncle        dx. 85s   Lung cancer Paternal Grandfather        d. 24s-90s; lung cancer vs TB   Colon cancer Sister        dx. 10-60; s/p surgery and radiation   Cancer Maternal Uncle        mother had about 7-8 other siblings, most passed at older ages, many of whom had some type  of cancer   Heart attack Neg Hx     Social History   Tobacco Use   Smoking status: Former    Packs/day: 0.10    Years: 2.00    Total pack years: 0.20    Types: Cigarettes    Quit date: 12/16/1974    Years since quitting: 47.6   Smokeless tobacco: Never   Tobacco comments:    "1 pack cigarettes would last me a month"  Substance Use Topics   Alcohol use: No    Subjective:   Accompanied by son; needs to get labs updated today; notes that acute gout has resolved- "back on track with my medications and feeling better; Is scheduled to follow up with neurology as discussed at last OV; has follow up with his cardiologist later in February 2024;    Objective:  Vitals:   07/12/22 1435  BP: 132/82  Pulse: 73  Resp: 18  SpO2: 98%  Weight: 263 lb 3.2 oz (119.4 kg)  Height: '5\' 10"'$  (1.778 m)    General: Well developed, well nourished, in no acute distress  Skin : Warm and dry.  Head: Normocephalic and atraumatic  Eyes: Sclera and conjunctiva clear; pupils round and reactive to light; extraocular movements intact  Ears: External normal; canals clear; tympanic membranes normal  Oropharynx: Pink, supple. No suspicious lesions  Neck: Supple without thyromegaly, adenopathy  Lungs: Respirations unlabored; clear to auscultation bilaterally without wheeze, rales, rhonchi  CVS exam: normal rate and regular rhythm.  Neurologic: Alert and oriented; speech intact; face symmetrical; moves all extremities well; CNII-XII intact without focal deficit    Assessment:  1. Acute gout of multiple sites, unspecified cause   2. Chronic gout without tophus, unspecified cause, unspecified site   3. Low magnesium level   4. Chronic diastolic (congestive) heart failure ([place])   5. Seizure [facility])     Plan:  Will check CBC, CMP, uric acid level; will most likely plan to increase dosage of Allopurinol; follow up to be determined; 3.   Check magnesium level today;  4.   Keep planned follow up with cardiology; 5.   Keep planned follow up with neurology;   No follow-ups on file.  Orders Placed This Encounter  Procedures   CBC with Differential/Platelet   Comp Met (CMET)   Magnesium   Uric acid    Requested Prescriptions    No prescriptions requested or ordered in this encounter

## 2022-07-13 LAB — COMPREHENSIVE METABOLIC PANEL
ALT: 15 U/L (ref 0–53)
AST: 18 U/L (ref 0–37)
Albumin: 3.9 g/dL (ref 3.5–5.2)
Alkaline Phosphatase: 159 U/L — ABNORMAL HIGH (ref 39–117)
BUN: 11 mg/dL (ref 6–23)
CO2: 29 mEq/L (ref 19–32)
Calcium: 9.1 mg/dL (ref 8.4–10.5)
Chloride: 101 mEq/L (ref 96–112)
Creatinine, Ser: 1.06 mg/dL (ref 0.40–1.50)
GFR: 71.03 mL/min (ref >60.00)
Glucose, Bld: 74 mg/dL (ref 70–99)
Potassium: 4.7 mEq/L (ref 3.5–5.1)
Sodium: 140 mEq/L (ref 135–145)
Total Bilirubin: 0.9 mg/dL (ref 0.2–1.2)
Total Protein: 6.9 g/dL (ref 6.0–8.3)

## 2022-07-13 LAB — MAGNESIUM: Magnesium: 1.9 mg/dL (ref 1.5–2.5)

## 2022-07-13 LAB — CBC WITH DIFFERENTIAL/PLATELET
Basophils Absolute: 0.1 10*3/uL (ref 0.0–0.1)
Basophils Relative: 1.9 % (ref 0.0–3.0)
Eosinophils Absolute: 0.2 10*3/uL (ref 0.0–0.7)
Eosinophils Relative: 3.4 % (ref 0.0–5.0)
HCT: 44.5 % (ref 39.0–52.0)
Hemoglobin: 14.4 g/dL (ref 13.0–17.0)
Lymphocytes Relative: 17.8 % (ref 12.0–46.0)
Lymphs Abs: 1 10*3/uL (ref 0.7–4.0)
MCHC: 32.4 g/dL (ref 30.0–36.0)
MCV: 83.9 fl (ref 78.0–100.0)
Monocytes Absolute: 0.6 10*3/uL (ref 0.1–1.0)
Monocytes Relative: 10 % (ref 3.0–12.0)
Neutro Abs: 3.8 10*3/uL (ref 1.4–7.7)
Neutrophils Relative %: 66.9 % (ref 43.0–77.0)
Platelets: 176 10*3/uL (ref 150.0–400.0)
RBC: 5.3 Mil/uL (ref 4.22–5.81)
RDW: 16 % — ABNORMAL HIGH (ref 11.5–15.5)
WBC: 5.7 10*3/uL (ref 4.0–10.5)

## 2022-07-13 LAB — URIC ACID: Uric Acid, Serum: 6.8 mg/dL (ref 4.0–7.8)

## 2022-07-21 ENCOUNTER — Telehealth: Payer: Self-pay | Admitting: Family

## 2022-07-21 ENCOUNTER — Other Ambulatory Visit: Payer: Self-pay | Admitting: Family

## 2022-07-21 ENCOUNTER — Other Ambulatory Visit: Payer: Self-pay | Admitting: Neurology

## 2022-07-21 MED ORDER — LEVETIRACETAM 500 MG PO TABS: 500.0000 mg | ORAL_TABLET | Freq: Two times a day (BID) | ORAL | 0 refills | Status: DC

## 2022-07-21 MED ORDER — COLCHICINE 0.6 MG PO CAPS: 0.6000 mg | ORAL_CAPSULE | Freq: Every day | ORAL | 0 refills | Status: DC

## 2022-07-21 MED ORDER — ALLOPURINOL 100 MG PO TABS: 100.0000 mg | ORAL_TABLET | Freq: Every day | ORAL | 0 refills | Status: DC

## 2022-07-21 NOTE — Telephone Encounter (Signed)
Okay to refill Keppra?  ?

## 2022-07-21 NOTE — Telephone Encounter (Signed)
Per PCP- okay to refill Keppra for 30 days- further from Dr. Leonie Man. Rx for colchicine and allopurinol sent.

## 2022-07-21 NOTE — Telephone Encounter (Signed)
Prescription Request  07/21/2022  Is this a "Controlled Substance" medicine? No  LOV: 07/12/2022  What is the name of the medication or equipment?   Colchicine 0.6 MG CAPS [[id number]]   Rx #: [id number]  allopurinol (ZYLOPRIM) 100 MG tablet [[id number]]   Rx #: [id number]  levETIRAcetam (KEPPRA) 500 MG tablet [[id number]]   Have you contacted your pharmacy to request a refill? No   Which pharmacy would you like this sent to?   [facility] - [place], [place] - [address] [place] [address] Phone: [phone] Fax: [phone]  Patient notified that their request is being sent to the clinical staff for review and that they should receive a response within 2 business days.   Please advise at Mobile [phone] (mobile)

## 2022-08-05 ENCOUNTER — Other Ambulatory Visit: Payer: Self-pay | Admitting: Family

## 2022-08-05 MED ORDER — DILTIAZEM HCL 120 MG PO TABS: 120.0000 mg | ORAL_TABLET | Freq: Three times a day (TID) | ORAL | 1 refills | Status: DC

## 2022-08-05 MED ORDER — DICLOFENAC SODIUM 1 % EX GEL: 2.0000 g | Freq: Four times a day (QID) | CUTANEOUS | 0 refills | Status: DC | PRN

## 2022-08-05 MED ORDER — LISINOPRIL 5 MG PO TABS: 5.0000 mg | ORAL_TABLET | Freq: Every day | ORAL | 1 refills | Status: DC

## 2022-08-11 ENCOUNTER — Ambulatory Visit: Payer: Medicare HMO | Admitting: Neurology

## 2022-08-21 ENCOUNTER — Other Ambulatory Visit: Payer: Self-pay | Admitting: Neurology

## 2022-08-21 ENCOUNTER — Other Ambulatory Visit: Payer: Self-pay | Admitting: Family

## 2022-08-22 ENCOUNTER — Telehealth: Payer: Self-pay | Admitting: Internal Medicine

## 2022-08-22 ENCOUNTER — Other Ambulatory Visit: Payer: Self-pay | Admitting: *Deleted

## 2022-08-22 DIAGNOSIS — I5032 Chronic diastolic (congestive) heart failure: Secondary | ICD-10-CM

## 2022-08-22 MED ORDER — FUROSEMIDE 40 MG PO TABS: 40.0000 mg | ORAL_TABLET | Freq: Every day | ORAL | 0 refills | Status: DC

## 2022-08-22 MED ORDER — COLCHICINE 0.6 MG PO CAPS: 0.6000 mg | ORAL_CAPSULE | Freq: Every day | ORAL | 0 refills | Status: DC

## 2022-08-22 MED ORDER — LEVETIRACETAM 500 MG PO TABS: 500.0000 mg | ORAL_TABLET | Freq: Two times a day (BID) | ORAL | 0 refills | Status: DC

## 2022-08-22 NOTE — Telephone Encounter (Signed)
Left message on daughter [name] phone for patient to schedule with Neuro, phone number given.  Advised them that they have tried to contact to make appointment.

## 2022-08-22 NOTE — Telephone Encounter (Signed)
*  STAT* If patient is at the pharmacy, call can be transferred to refill team.   1. Which medications need to be refilled? (please list name of each medication and dose if known) furosemide (LASIX) 40 MG tablet   2. Which pharmacy/location (including street and city if local pharmacy) is medication to be sent to?  [facility] - [place], [place] - [address]    3. Do they need a 30 day or 90 day supply? 30 day  Patient is completely out of medication.

## 2022-08-25 ENCOUNTER — Ambulatory Visit: Payer: Medicare HMO | Admitting: Internal Medicine

## 2022-09-05 ENCOUNTER — Ambulatory Visit: Payer: Medicare HMO | Admitting: Neurology

## 2022-09-05 ENCOUNTER — Encounter: Payer: Self-pay | Admitting: Neurology

## 2022-09-06 ENCOUNTER — Other Ambulatory Visit: Payer: Self-pay | Admitting: Internal Medicine

## 2022-09-06 ENCOUNTER — Other Ambulatory Visit: Payer: Self-pay | Admitting: Family

## 2022-09-06 DIAGNOSIS — I5032 Chronic diastolic (congestive) heart failure: Secondary | ICD-10-CM

## 2022-09-07 ENCOUNTER — Ambulatory Visit: Payer: Medicare HMO | Admitting: Internal Medicine

## 2022-09-08 ENCOUNTER — Encounter: Payer: Self-pay | Admitting: Neurology

## 2022-09-08 ENCOUNTER — Ambulatory Visit: Payer: Medicare HMO | Admitting: Neurology

## 2022-09-08 VITALS — BP 123/79 | HR 80 | Ht 70.0 in | Wt 261.0 lb

## 2022-09-08 DIAGNOSIS — G40909 Epilepsy, unspecified, not intractable, without status epilepticus: Secondary | ICD-10-CM | POA: Diagnosis not present

## 2022-09-08 MED ORDER — LEVETIRACETAM 250 MG PO TABS: 250.0000 mg | ORAL_TABLET | Freq: Two times a day (BID) | ORAL | 1 refills | Status: DC

## 2022-09-08 NOTE — Progress Notes (Signed)
[facility] [address] [place]. [place] [address] [phone]       OFFICE FOLLOW-UP NOTE  [name] Date of Birth:  Dec 14, 1950 Medical Record Number:  [id number]   HPI: Mr. Dacko is a 72 year old African-American male seen today for initial office follow-up visit following hospital admission for intracerebral hemorrhage in April 2023.  He is accompanied by her daughter.  History is obtained from them and review of electronic medical records.  I have personally reviewed pertinent available imaging films in PACS.  He has past medical history of hypertension, stage I colon cancer, atrial fibrillation on long term anticoagulation with warfarin, obstructive sleep apnea, Trelegy and obesity.  He presented on 10/09/2021 with sudden onset of of altered mental status he was found to have some shaking of the right upper extremity and.  Blood pressure on arrival was 160/104.  Patient was seen by teleneurologist who apparently a partial complex seizure involving right body and right gaze deviation CT head showed left atrial parieto-occipital 1.8 x 1.2 x 1 parenchymal hematoma with mild surrounding edema.  Score was 1 patient was on warfarin and INR was optimal at 2.0.  This was reversed with Kcentra.  CT angiogram of the brain and neck showed no evidence of aneurysm, AVM or occlusion.  He was admitted to the neuro ICU and blood pressure was tightly controlled.  MRI scan confirmed parenchymal right occipital periatrial hematoma without underlying he was negative for seizures.  2D echo showed ejection fraction of 60-65% without cardiac source of embolism.  LDL cholesterol 62 mg percent.  Hemoglobin A1c was warfarin was held during her hospitalization and was started after about a month later he was switched to Eliquis.  Hospitalization was complicated by flareup of his gout for which he was started on colchicine and.  Prednisone.  He was discharged for rehabilitation to skilled nursing  facility.  Patient states is done well.  He missed several follow-up appointments in our office since then.  He is currently living at home with his daughter.  He is doing well.  He is independent in all activities of daily living.Marland Kitchen  His main complaint today is likely having a cough which may be due to flareup of his allergies.Marland Kitchen  He plans to see his primary care physician for the same next week.  He states he is taking all his medications and his blood pressure is well-controlled.  Today it is 123/79.  ROS:   14 system review of systems is positive for cough, allergies and all other systems negative  PMH:  Past Medical History:  Diagnosis Date   Anemia    Arthralgia of both knees    Arthritis    "right knee" (10/01/2015)   Atrial fibrillation (HCC)    Cancer ([place])    STAGE 1 COLON CANCER   Chronic anticoagulation 2010   Coumadin   Chronic diastolic CHF (congestive heart failure), NYHA class 2 (HCC)    Dyspnea    Dysrhythmia    Hypertension    OSA (obstructive sleep apnea) 2016   "couldn't take the mask during the testing" (10/01/2015)   Permanent atrial fibrillation ([place]) 2010   Pneumonia 3/29/2017and june 2017    Social History:  Social History   Socioeconomic History   Marital status: Married    Spouse name: Ronnette Juniper   Number of children: Not on file   Years of education: Not on file   Highest education level: Not on file  Occupational History   Not  on file  Tobacco Use   Smoking status: Former    Packs/day: 0.10    Years: 2.00    Total pack years: 0.20    Types: Cigarettes    Quit date: 12/16/1974    Years since quitting: 47.7   Smokeless tobacco: Never   Tobacco comments:    "1 pack cigarettes would last me a month"  Vaping Use   Vaping Use: Never used  Substance and Sexual Activity   Alcohol use: No   Drug use: No    Types: Cocaine, Marijuana    Comment: Hx polysubstance abuse, quit 2008   Sexual activity: Not Currently  Other Topics Concern   Not on file   Social History Narrative   Married, wife Ronnette Juniper (since 71)   Social Determinants of Health   Financial Resource Strain: Not on file  Food Insecurity: Not on file  Transportation Needs: Not on file  Physical Activity: Not on file  Stress: Not on file  Social Connections: Not on file  Intimate Partner Violence: Not on file    Medications:   Current Outpatient Medications on File Prior to Visit  Medication Sig Dispense Refill   acetaminophen (TYLENOL) 500 MG tablet Take 1 tablet (500 mg total) by mouth every 6 (six) hours as needed. Take 1 tablet 500 mg 3 times daily x5 days, then every 6 hours as needed.  0   albuterol (VENTOLIN HFA) 108 (90 Base) MCG/ACT inhaler Inhale 2 puffs into the lungs every 6 (six) hours as needed for wheezing or shortness of breath. 1 each 2   allopurinol (ZYLOPRIM) 100 MG tablet Take 1 tablet (100 mg total) by mouth daily. 90 tablet 0   apixaban (ELIQUIS) 5 MG TABS tablet Take 1 tablet (5 mg total) by mouth in the morning and at bedtime. 180 tablet 1   aspirin EC 81 MG tablet Take 81 mg by mouth every morning.     Colchicine 0.6 MG CAPS Take 1 capsule (0.6 mg total) by mouth daily. 90 capsule 0   diclofenac Sodium (VOLTAREN) 1 % GEL Apply 2 g topically 4 (four) times daily as needed (for joint pain). 100 g 0   diltiazem (CARDIZEM) 120 MG tablet Take 1 tablet (120 mg total) by mouth 3 (three) times daily. 270 tablet 1   furosemide (LASIX) 40 MG tablet Take 1 tablet (40 mg total) by mouth daily. Please keep upcoming appointment in February 2024 for future refills. Thank you 90 tablet 0   lisinopril (ZESTRIL) 5 MG tablet Take 1 tablet (5 mg total) by mouth daily. 90 tablet 1   MAGNESIUM-OXIDE 400 (240 Mg) MG tablet Take 1 tablet by mouth 2 (two) times daily.     pantoprazole (PROTONIX) 40 MG tablet Take 1 tablet (40 mg total) by mouth daily. 30 tablet 1   polyethylene glycol powder (GLYCOLAX/MIRALAX) 17 GM/SCOOP powder Measure  1 capful (17 g) & mix with 4 oz of  water and drink by mouth daily. 238 g 1   potassium chloride (KLOR-CON) 10 MEQ tablet Take 1 tablet (10 mEq total) by mouth daily. 90 tablet 3   senna-docusate (SENOKOT-S) 8.6-50 MG tablet Take 1 tablet by mouth 2 (two) times daily.     No current facility-administered medications on file prior to visit.    Allergies:  No Known Allergies  Physical Exam General: Obese pleasant elderly African-American male seated, in no evident distress Head: head normocephalic and atraumatic.  Neck: supple with no carotid or supraclavicular bruits Cardiovascular: regular  rate and rhythm, no murmurs Musculoskeletal: no deformity Skin:  no rash/petichiae Vascular:  Normal pulses all extremities Vitals:   09/08/22 1117  BP: 123/79  Pulse: 80   Neurologic Exam Mental Status: Awake and fully alert. Oriented to place and time. Recent and remote memory intact. Attention span, concentration and fund of knowledge appropriate. Mood and affect appropriate.  Cranial Nerves: Fundoscopic exam reveals sharp disc margins. Pupils equal, briskly reactive to light. Extraocular movements full without nystagmus. Visual fields full to confrontation. Hearing intact. Facial sensation intact. Face, tongue, palate moves normally and symmetrically.  Motor: Normal bulk and tone. Normal strength in all tested extremity muscles. Sensory.: intact to touch ,pinprick .position and vibratory sensation.  Coordination: Rapid alternating movements normal in all extremities. Finger-to-nose and heel-to-shin performed accurately bilaterally. Gait and Station: Arises from chair without difficulty. Stance is normal. Gait demonstrates normal stride length and balance . Able to heel, toe and tandem walk with moderate difficulty.  Reflexes: 1+ and symmetric. Toes downgoing.   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  He has since been switched to Eliquis and is doing very well.  History of questionable seizures admission for hospitalization with normal EEG and started on Keppra but has beenseizure free for a year     PLAN:I had a long discussion with the patient about this he developed intracerebral hemorrhage and atrial fibrillation need for anticoagulation and answered questions.  Continue Eliquis and the current dose of 5 mg twice daily but start tapering Keppra has not had a definite seizure and close use only for seizure prophylaxis.  Will check an EEG.  Reduce Keppra to 250 mg twice daily for complaints of sleepiness.  Continue strict control of hypertension with blood pressure goal below 140/90.  He was also advised to be compliant with using his CPAP for his sleep apnea.  He will return for follow-up in the future in a year or call earlier if necessary Greater than 50% of time during this 35 minute visit was spent on counseling,explanation of diagnosis of hemorrhagic infarct and A-fib and risk benefit of anticoagulation, planning of further management, discussion with patient and family and coordination of care Antony Contras, MD Note: This document was prepared with digital dictation and possible smart phrase technology. Any transcriptional errors that result from this process are unintentional

## 2022-09-08 NOTE — Patient Instructions (Signed)
I had a long discussion with the patient about this he developed intracerebral hemorrhage and atrial fibrillation need for anticoagulation and answered questions.  Continue Eliquis and the current dose of 5 mg twice daily but start tapering Keppra has not had a definite seizure and close use only for seizure prophylaxis.  Will check an EEG.  Reduce Keppra to 250 mg twice daily for a month followed by 250 mg once daily and then discontinue.  Continue strict control of hypertension with blood pressure goal below 140/90.  She will return for follow-up in the future in a year or call earlier if necessary

## 2022-09-09 ENCOUNTER — Other Ambulatory Visit: Payer: Self-pay | Admitting: Family

## 2022-09-12 ENCOUNTER — Other Ambulatory Visit (INDEPENDENT_AMBULATORY_CARE_PROVIDER_SITE_OTHER): Payer: Medicare HMO

## 2022-09-12 DIAGNOSIS — R7989 Other specified abnormal findings of blood chemistry: Secondary | ICD-10-CM

## 2022-09-12 LAB — HEPATIC FUNCTION PANEL
ALT: 20 U/L (ref 0–53)
AST: 19 U/L (ref 0–37)
Albumin: 3.8 g/dL (ref 3.5–5.2)
Alkaline Phosphatase: 127 U/L — ABNORMAL HIGH (ref 39–117)
Bilirubin, Direct: 0.2 mg/dL (ref 0.0–0.3)
Total Bilirubin: 0.7 mg/dL (ref 0.2–1.2)
Total Protein: 6.7 g/dL (ref 6.0–8.3)

## 2022-09-15 ENCOUNTER — Ambulatory Visit: Payer: Medicare HMO | Admitting: Neurology

## 2022-09-15 DIAGNOSIS — G40909 Epilepsy, unspecified, not intractable, without status epilepticus: Secondary | ICD-10-CM | POA: Diagnosis not present

## 2022-09-19 ENCOUNTER — Encounter: Payer: Self-pay | Admitting: Internal Medicine

## 2022-09-19 ENCOUNTER — Ambulatory Visit: Payer: Medicare HMO | Attending: Internal Medicine | Admitting: Internal Medicine

## 2022-09-19 VITALS — BP 140/90 | HR 64 | Ht 70.0 in | Wt 278.4 lb

## 2022-09-19 DIAGNOSIS — I10 Essential (primary) hypertension: Secondary | ICD-10-CM | POA: Diagnosis not present

## 2022-09-19 DIAGNOSIS — Z7901 Long term (current) use of anticoagulants: Secondary | ICD-10-CM | POA: Diagnosis not present

## 2022-09-19 DIAGNOSIS — I4821 Permanent atrial fibrillation: Secondary | ICD-10-CM | POA: Diagnosis not present

## 2022-09-19 DIAGNOSIS — Z8673 Personal history of transient ischemic attack (TIA), and cerebral infarction without residual deficits: Secondary | ICD-10-CM | POA: Diagnosis not present

## 2022-09-19 NOTE — Patient Instructions (Signed)
Medication Instructions:  NO CHANGES  *If you need a refill on your cardiac medications before your next appointment, please call your pharmacy*   Lab Work: LIPID PANEL to check cholesterol   If you have labs (blood work) drawn today and your tests are completely normal, you will receive your results only by: [place] (if you have MyChart) OR A paper copy in the mail If you have any lab test that is abnormal or we need to change your treatment, we will call you to review the results.   Follow-Up: At [facility], you and your health needs are our priority.  As part of our continuing mission to provide you with exceptional heart care, we have created designated Provider Care Teams.  These Care Teams include your primary Cardiologist (physician) and Advanced Practice Providers (APPs -  Physician Assistants and Nurse Practitioners) who all work together to provide you with the care you need, when you need it.  We recommend signing up for the patient portal called "MyChart".  Sign up information is provided on this After Visit Summary.  MyChart is used to connect with patients for Virtual Visits (Telemedicine).  Patients are able to view lab/test results, encounter notes, upcoming appointments, etc.  Non-urgent messages can be sent to your provider as well.   To learn more about what you can do with MyChart, go to [email or web address].    Your next appointment:   12 month(s)  Provider:   Pixie Casino, MD

## 2022-09-19 NOTE — Progress Notes (Addendum)
Marland Kitchen    OFFICE NOTE  Chief Complaint:  Follow-up  Primary Care Physician: Marrian Salvage, FNP  HPI:  Steven Barnett is a 72 year old morbidly obese African American male with a history significant for hypertension, sleep disorder consistent with obstructive sleep apnea, recent diagnosis of diastolic heart failure with moderate LVH and recent diagnosis of postoperative atrial fibrillation (continues to be in atrial fibrillation) presenting with atypical chest pain. The patient was hospitalized in May 2010 and diagnosed with a right-sided empyema, for which he underwent a thoracotomy. He was found to be in atrial fibrillation postoperatively and has been in this rhythm ever since. At that time, he was also diagnosed with diastolic heart failure and found to have moderate left ventricular hypertrophy by echocardiogram.   Unfortunately, he was incarcerated for the past several years and has not been followed by cardiologist. His warfarin has been continued while in jail however was not adjusted regularly. He recently saw a new primary care provider with [facility] physicians and he is here to reestablish cardiac care and followup of his warfarin. He remains in atrial fibrillation today. He reports shortness of breath which is unchanged.  He denies any chest pain. He does report some leg swelling. He has problems with his knees from arthritis and pain. He also has not been able to lose weight. Finally, he has a history of sleep apnea but was never treated for this. He reports poor sleep at night and daytime fatigue and sometimes feels like he doesn't want to fall asleep because he feels like he is going to stop breathing. There is witnessed apnea.  I saw Steven Barnett in the office today. His main complaints are with erectile dysfunction. He is interested in Viagra. Denies any chest pain and reports good sleep. He is therapeutic on warfarin and is in permanent atrial fibrillation.  Steven Barnett returns  to the office today. He is inquiring about getting generic sildenafil as Viagra was not covered. He also wants to be referred for colonoscopy. I told him that would be up to his primary care provider but he said "I thought you were my primary care provider". I clarified the differences in of encouraged him to follow-up with his primary care provider with [facility] physicians. We will go ahead and refer him for screening colonoscopy. He was advised that he need to hold warfarin for least 5 days prior to colonoscopy.  I saw Steven Barnett back in the office today. He was seen this summer by Tarri Fuller, PA-C, for evaluation of cough and shortness of breath. A chest x-ray demonstrated possible pneumonia and was treated with doxycycline. He's not sure if this actually improved his symptoms or not. He recently has been absent for monthly warfarin checks. He says that he was not notified by the office however monthly appointments are scheduled and call also been made. He would like to arrange for a set day for him to come and get his warfarin checked.  Steven Barnett returns today for follow-up. He was recently hospitalized with acute on chronic diastolic heart failure. He had significant weight gain and edema with shortness of breath. He was diuresed and discharged in much better condition. He follows up today in the office. He says that he has not been entirely compliant with his medications, due to some confusion. His blood pressure today is 150/90 with a heart rate of 90. He says he's been taking 25 mg or 1 tablet of Lopressor twice daily and set of  2 tablets twice daily which was recommended. Weight on discharge was 264 and today is 273 although he measured to 69 at home. He denies any worsening swelling or shortness of breath.  Steven Barnett returns again today for hospital follow-up. He was last seen by myself on March 1 and that was for hospital follow-up. He is now seen again for another hospital follow-up. Unfortunately he  redeveloped a pneumonia and had acute on chronic congestive heart failure. He had significant diuresis and treated with antibiotics and steroids. He was discharged and reports his breathing has improved. He is now on 80 mg Lasix twice a day. He had confusing directions on prednisone. He has 60 mg tablets but his medication list indicated 10 mg tablets. He was advised to taper down, but he did not how to do that with the 60 mg tablets. Currently he is taking 20 mg 3 times a day. Weight is actually 2 pounds lower than discharge.  12/03/2015  Steven Barnett was seen back today in the office for follow-up. His heart rate was elevated today 124 and he reports he ran out of his metoprolol despite there being an active order in the pharmacy. We checked on that today. He apparently is taking diltiazem 90 mg daily. His INR was also recheck today and is subtherapeutic at 1.4. This is concerning for ongoing medication noncompliance. Spiked the increased heart rate he denies any worsening shortness of breath or chest pain.  04/06/2016  Steven Barnett was seen in the office today for follow-up. He is doing much better now that we've reinstituted his medicines. Blood pressure is well-controlled at 118/82 today. Heart rate is in well-controlled at 64 and he is in atrial fibrillation. Warfarin was assessed as well with an INR checked today. Unfortunately he recently was found to have a colorectal cancer by Dr. [name]. He is scheduled for a laparoscopic ileocolectomy by Dr. [name] on 04/12/2016.   04/24/2018  Steven Barnett returns today for follow-up.  Is been 2 years since I last saw him.  He is been followed in the Coumadin clinic and INR was therapeutic today.  He had undergone surgery for colon cancer in 2017 with adjuvant chemotherapy.  Follow-up afterwards has been reassuring with low CEAs and no evidence of recurrence.  He denies chest pain or worsening shortness of breath.  He has had no heart failure hospitalizations.  His main  complaint is daytime fatigue and sleepiness.  He says he has trouble falling asleep but generally uses Xanax that is not his prescription.  He does have a history of obstructive sleep apnea but was apparently intolerant of the mask.  The study was performed years ago and we discussed the possibility of repeating the study today based on the newer and more tolerable equipment available.  02/26/2020  Steven Barnett is seen today in follow-up.  He has been seen in the interim twice by Roby Lofts, PA-C for evaluation of shortness of breath.  He was also complaining of bendopnea/orthopnea -he has significant abdominal obesity.  His diuretics were increased due to some lower extremity swelling and is currently on a Lasix 80 mg twice daily with metolazone twice a week.  This seems to be controlling his edema and his weight is down a few pounds.  Blood pressure is also well controlled at 118/80 today with recent medication adjustments.  He remains in A. fib which is longstanding persistent and is anticoagulated on warfarin.  INR was elevated today 3.9 and will be adjusted by  our anticoagulation pharmacist.  08/27/2020  Steven Barnett returns today for follow-up.  He seems to be doing well.  He is lost about 10 pounds.  His INRs have been good.  Recently was seen in the emergency department with kidney pain.  He was found to have a cyst which is responded to pain medication and to steroids.  He denies any chest pain.    09/19/2022  Steven Barnett returns today for follow-up.  Since I last saw him unfortunately he had intracranial hemorrhage and subsequent seizure with this.  He is been followed by neurology.  He was switched off of warfarin over to Eliquis.  He seems to be tolerating that well and has not had any further complications.  He remains in A-fib which is permanent.  Otherwise he is asymptomatic.  His last lipid profile was in 2023.  He is not currently on statin therapy.  PMHx:  Past Medical History:  Diagnosis  Date   Anemia    Arthralgia of both knees    Arthritis    "right knee" (10/01/2015)   Atrial fibrillation (HCC)    Cancer ([place])    STAGE 1 COLON CANCER   Chronic anticoagulation 2010   Coumadin   Chronic diastolic CHF (congestive heart failure), NYHA class 2 ([place])    Dyspnea    Dysrhythmia    Hypertension    OSA (obstructive sleep apnea) 2016   "couldn't take the mask during the testing" (10/01/2015)   Permanent atrial fibrillation ([place]) 2010   Pneumonia 3/29/2017and june 2017    Past Surgical History:  Procedure Laterality Date   COLONOSCOPY WITH PROPOFOL N/A 03/04/2016   Procedure: COLONOSCOPY WITH PROPOFOL;  Surgeon: Carol Ada, MD;  Location: [facility];  Service: Endoscopy;  Laterality: N/A;   COLONOSCOPY WITH PROPOFOL N/A 09/15/2017   Procedure: COLONOSCOPY WITH PROPOFOL;  Surgeon: Carol Ada, MD;  Location: [facility];  Service: Endoscopy;  Laterality: N/A;   COLONOSCOPY WITH PROPOFOL N/A 02/12/2021   Procedure: COLONOSCOPY WITH PROPOFOL;  Surgeon: Carol Ada, MD;  Location: [facility];  Service: Endoscopy;  Laterality: N/A;   LAPAROSCOPIC PARTIAL COLECTOMY N/A 04/12/2016   Procedure: LAPAROSCOPIC ILEOCOLECTOMY;  Surgeon: [name], MD;  Location: [place] OR;  Service: General;  Laterality: N/A;   PORT-A-CATH REMOVAL N/A 02/16/2017   Procedure: REMOVAL PORT-A-CATH;  Surgeon: [name], MD;  Location: [facility] OR;  Service: General;  Laterality: N/A;   PORTACATH PLACEMENT N/A 05/18/2016   Procedure: INSERTION PORT-A-CATH;  Surgeon: [name], MD;  Location: [facility];  Service: General;  Laterality: N/A;   THORACOTOMY Right 2010    FAMHx:  Family History  Problem Relation Age of Onset   Heart disease Father    Heart failure Father    Colon polyps Father        unspecified number - pt of intestine surgically resected   Hypertension Mother    Diabetes Mother    Hyperlipidemia Mother    Colon polyps Mother        unspecified number - pt of  intestine surgically resected   Dementia Mother        d. 48   Stroke Maternal Grandmother    Stroke Sister    Colon cancer Sister 67       w/ "cancerous polyps" - unspecified number; underwent surgery and radiation   Colon polyps Brother        unspecified number - polypectomies   Colon cancer Sister        dx  50-60; s/p surgery and radiation   Bone cancer Maternal Uncle        dx. 86s   Lung cancer Paternal Grandfather        d. 49s-90s; lung cancer vs TB   Colon cancer Sister        dx. 39-60; s/p surgery and radiation   Cancer Maternal Uncle        mother had about 7-8 other siblings, most passed at older ages, many of whom had some type of cancer   Heart attack Neg Hx     SOCHx:   reports that he quit smoking about 47 years ago. His smoking use included cigarettes. He has a 0.20 pack-year smoking history. He has never used smokeless tobacco. He reports that he does not drink alcohol and does not use drugs.  ALLERGIES:  No Known Allergies  ROS: Pertinent items noted in HPI and remainder of comprehensive ROS otherwise negative.  HOME MEDS: Current Outpatient Medications  Medication Sig Dispense Refill   acetaminophen (TYLENOL) 500 MG tablet Take 1 tablet (500 mg total) by mouth every 6 (six) hours as needed. Take 1 tablet 500 mg 3 times daily x5 days, then every 6 hours as needed.  0   albuterol (VENTOLIN HFA) 108 (90 Base) MCG/ACT inhaler Inhale 2 puffs into the lungs every 6 (six) hours as needed for wheezing or shortness of breath. 1 each 2   allopurinol (ZYLOPRIM) 100 MG tablet Take 1 tablet (100 mg total) by mouth daily. 90 tablet 0   apixaban (ELIQUIS) 5 MG TABS tablet Take 1 tablet (5 mg total) by mouth in the morning and at bedtime. 180 tablet 1   aspirin EC 81 MG tablet Take 81 mg by mouth every morning.     Colchicine 0.6 MG CAPS Take 1 capsule (0.6 mg total) by mouth daily. 90 capsule 0   diclofenac Sodium (VOLTAREN) 1 % GEL Apply 2 g topically 4 (four) times  daily as needed (for joint pain). 100 g 0   diltiazem (CARDIZEM) 120 MG tablet Take 1 tablet (120 mg total) by mouth 3 (three) times daily. 270 tablet 1   furosemide (LASIX) 40 MG tablet Take 1 tablet (40 mg total) by mouth daily. Please keep upcoming appointment in February 2024 for future refills. Thank you 90 tablet 0   levETIRAcetam (KEPPRA) 250 MG tablet Take 1 tablet (250 mg total) by mouth 2 (two) times daily. Recommend Keppra  250 mg twice daily for a month followed by to 50 mg once daily and then discontinue 30 tablet 1   lisinopril (ZESTRIL) 5 MG tablet Take 1 tablet (5 mg total) by mouth daily. 90 tablet 1   MAGNESIUM-OXIDE 400 (240 Mg) MG tablet Take 1 tablet by mouth 2 (two) times daily.     pantoprazole (PROTONIX) 40 MG tablet Take 1 tablet by mouth once daily 90 tablet 0   polyethylene glycol powder (GLYCOLAX/MIRALAX) 17 GM/SCOOP powder Measure  1 capful (17 g) & mix with 4 oz of water and drink by mouth daily. 238 g 1   potassium chloride (KLOR-CON) 10 MEQ tablet Take 1 tablet (10 mEq total) by mouth daily. 90 tablet 3   senna-docusate (SENOKOT-S) 8.6-50 MG tablet Take 1 tablet by mouth 2 (two) times daily.     No current facility-administered medications for this visit.    LABS/IMAGING: No results found for this or any previous visit (from the past 48 hour(s)).  No results found.  VITALS: BP (!) 140/90  Pulse 64   Ht 5\' 10"  (1.778 m)   Wt 278 lb 6.4 oz (126.3 kg)   SpO2 94%   BMI 39.95 kg/m   EXAM: General appearance: alert, no distress and morbidly obese Neck: no carotid bruit and no JVD Lungs: clear to auscultation bilaterally Heart: irregularly irregular rhythm Abdomen: soft, non-tender; bowel sounds normal; no masses,  no organomegaly Extremities: edema trace pitting edema Pulses: 2+ and symmetric Skin: Skin color, texture, turgor normal. No rashes or lesions Neurologic: Grossly normal Psych: Pleasant  EKG: A. 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   Steven Barnett unfortunately had a traumatic intracerebral hemorrhage on warfarin.  He was transitioned to Eliquis and is doing well.  He remains on antiepileptic medication.  He has no heart failure symptoms.  Blood pressure is top normal.  His LDL was at target less than 70 a year ago, not on statin therapy, therefore we will recheck his lipids today.  No changes to his medicines today.  Follow-up with me annually or sooner as necessary.  Pixie Casino, MD, [facility], [place] Director of the Advanced Lipid Disorders &  Cardiovascular Risk Reduction Clinic Diplomate of the American Board of Clinical Lipidology Attending Cardiologist  Direct Dial: [phone]  Fax: [phone]  Website:  [email or web address] 09/19/2022, 8:38 AM

## 2022-09-20 LAB — LIPID PANEL
Chol/HDL Ratio: 3 ratio (ref 0.0–5.0)
Cholesterol, Total: 131 mg/dL (ref 100–199)
HDL: 43 mg/dL (ref >39)
LDL Chol Calc (NIH): 63 mg/dL (ref 0–99)
Triglycerides: 144 mg/dL (ref 0–149)
VLDL Cholesterol Cal: 25 mg/dL (ref 5–40)

## 2022-09-23 ENCOUNTER — Encounter: Payer: Self-pay | Admitting: Internal Medicine

## 2022-09-23 NOTE — Progress Notes (Signed)
Kindly inform the patient that EEG or brainwave study was normal.  No evidence of seizure activity

## 2022-09-26 ENCOUNTER — Telehealth: Payer: Self-pay

## 2022-09-26 NOTE — Telephone Encounter (Signed)
-----   Message from Garvin Fila, MD sent at 09/23/2022  8:26 AM EDT ----- Steven Barnett inform the patient that EEG or brainwave study was normal.  No evidence of seizure activity

## 2022-09-26 NOTE — Telephone Encounter (Signed)
Left msg for pt to call back to obtain results

## 2022-10-10 ENCOUNTER — Telehealth: Payer: Self-pay | Admitting: Family

## 2022-10-10 NOTE — Telephone Encounter (Signed)
Copied from CRM [id number]. Topic: Medicare AWV >> Oct 10, 2022 11:26 AM Payton Doughty wrote: Reason for CRM: Called patient to schedule Medicare Annual Wellness Visit (AWV). Left message for patient to call back and schedule Medicare Annual Wellness Visit (AWV).  Last date of AWV: NONE  Please schedule an appointment at any time with Donne Anon, CMA  .  If any questions, please contact me.  Thank you ,  Verlee Rossetti; Care Guide Ambulatory Clinical Support [facility] Direct Dial: [phone]

## 2022-10-18 ENCOUNTER — Other Ambulatory Visit: Payer: Self-pay | Admitting: Family

## 2022-11-09 ENCOUNTER — Other Ambulatory Visit: Payer: Self-pay | Admitting: Family

## 2022-11-11 ENCOUNTER — Encounter: Payer: Self-pay | Admitting: Family

## 2022-11-15 ENCOUNTER — Ambulatory Visit (HOSPITAL_BASED_OUTPATIENT_CLINIC_OR_DEPARTMENT_OTHER)
Admission: RE | Admit: 2022-11-15 | Discharge: 2022-11-15 | Disposition: A | Discharge status: Home / Self Care | Payer: Medicare HMO | Source: Ambulatory Visit | Attending: Family | Admitting: Family

## 2022-11-15 ENCOUNTER — Encounter: Payer: Self-pay | Admitting: Hematology

## 2022-11-15 ENCOUNTER — Other Ambulatory Visit (HOSPITAL_BASED_OUTPATIENT_CLINIC_OR_DEPARTMENT_OTHER): Payer: Self-pay

## 2022-11-15 ENCOUNTER — Encounter: Payer: Self-pay | Admitting: Neurology

## 2022-11-15 ENCOUNTER — Ambulatory Visit (INDEPENDENT_AMBULATORY_CARE_PROVIDER_SITE_OTHER): Payer: Medicare HMO | Admitting: Family

## 2022-11-15 ENCOUNTER — Other Ambulatory Visit: Payer: Self-pay | Admitting: Family

## 2022-11-15 ENCOUNTER — Encounter: Payer: Self-pay | Admitting: Family

## 2022-11-15 VITALS — BP 136/72 | HR 80 | Ht 70.0 in | Wt 270.8 lb

## 2022-11-15 DIAGNOSIS — R053 Chronic cough: Secondary | ICD-10-CM

## 2022-11-15 MED ORDER — BENZONATATE 100 MG PO CAPS
100.0000 mg | ORAL_CAPSULE | Freq: Three times a day (TID) | ORAL | 0 refills | Status: DC | PRN
  Filled 2022-11-15: qty 20, 7d supply, fill #0

## 2022-11-15 MED ORDER — METHYLPREDNISOLONE 4 MG PO TBPK
ORAL_TABLET | ORAL | 0 refills | Status: DC
  Filled 2022-11-15: qty 21, 6d supply, fill #0

## 2022-11-15 MED ORDER — DOXYCYCLINE HYCLATE 100 MG PO TABS
100.0000 mg | ORAL_TABLET | Freq: Two times a day (BID) | ORAL | 0 refills | Status: DC
  Filled 2022-11-15: qty 14, 7d supply, fill #0

## 2022-11-15 NOTE — Progress Notes (Signed)
Steven Barnett is a 72 y.o. male with the following history as recorded in [employer]:  Patient Active Problem List   Diagnosis Date Noted   Constipation 10/16/2021   Atrial fibrillation with rapid ventricular response (HCC) 10/13/2021   Hyponatremia 10/13/2021   Gout 10/13/2021   Hypertensive emergency    Seizure (HCC)    ICH (intracerebral hemorrhage) (HCC) 10/09/2021   Hypovolemia 01/14/2021   Hypokalemia 01/14/2021   Hypomagnesemia 01/14/2021   Normocytic anemia 01/14/2021   Gait instability 01/14/2021   Hypotension 08/17/2016   SIRS (systemic inflammatory response syndrome) (HCC) 08/17/2016   Influenza A 08/17/2016   Septic shock (HCC) 08/10/2016   Neutropenia, drug-induced (HCC)    Gastroenteritis due to norovirus    Acute kidney injury (HCC)    Genetic testing 07/01/2016   Port catheter in place 06/29/2016   Family history of colon cancer 06/08/2016   Family history of colonic polyps 06/08/2016   Cancer of right colon (HCC) 04/12/2016   Pre-operative cardiovascular examination 04/06/2016   Acute on chronic diastolic heart failure (HCC) 02/02/2016   Acute respiratory failure with hypoxia (HCC) 02/02/2016   Anemia, iron deficiency 02/02/2016   Current use of long term anticoagulation 12/03/2015   Pneumonia 09/30/2015   PNA (pneumonia) 09/30/2015   Acute on chronic congestive heart failure (HCC)    CHF exacerbation (HCC) 07/08/2015   Community acquired pneumonia 02/10/2015   ED (erectile dysfunction) 08/12/2014   Arthralgia of both knees 04/08/2014   Chronic diastolic (congestive) heart failure (HCC) 01/14/2009   Morbid obesity (HCC) 12/31/2008   Obstructive sleep apnea 12/31/2008   Essential hypertension, benign 12/31/2008   Atrial fibrillation (HCC) 12/31/2008    Current Outpatient Medications  Medication Sig Dispense Refill   acetaminophen (TYLENOL) 500 MG tablet Take 1 tablet (500 mg total) by mouth every 6 (six) hours as needed. Take 1 tablet 500 mg 3 times  daily x5 days, then every 6 hours as needed.  0   albuterol (VENTOLIN HFA) 108 (90 Base) MCG/ACT inhaler Inhale 2 puffs into the lungs every 6 (six) hours as needed for wheezing or shortness of breath. 1 each 2   allopurinol (ZYLOPRIM) 100 MG tablet Take 1 tablet by mouth once daily 90 tablet 0   aspirin EC 81 MG tablet Take 81 mg by mouth every morning.     benzonatate (TESSALON) 100 MG capsule Take 1 capsule (100 mg total) by mouth 3 (three) times daily as needed. 20 capsule 0   Colchicine 0.6 MG CAPS Take 1 capsule (0.6 mg total) by mouth daily. 90 capsule 0   diclofenac Sodium (VOLTAREN) 1 % GEL Apply 2 g topically 4 (four) times daily as needed (for joint pain). 100 g 0   diltiazem (CARDIZEM) 120 MG tablet Take 1 tablet (120 mg total) by mouth 3 (three) times daily. 270 tablet 1   doxycycline (VIBRA-TABS) 100 MG tablet Take 1 tablet (100 mg total) by mouth 2 (two) times daily. 14 tablet 0   furosemide (LASIX) 40 MG tablet Take 1 tablet (40 mg total) by mouth daily. Please keep upcoming appointment in February 2024 for future refills. Thank you 90 tablet 0   lisinopril (ZESTRIL) 5 MG tablet Take 1 tablet (5 mg total) by mouth daily. 90 tablet 1   MAGNESIUM-OXIDE 400 (240 Mg) MG tablet Take 1 tablet by mouth 2 (two) times daily.     methylPREDNISolone (MEDROL DOSEPAK) 4 MG TBPK tablet Take as directed on pack for 6 days. 21 tablet 0  pantoprazole (PROTONIX) 40 MG tablet Take 1 tablet by mouth once daily 90 tablet 0   polyethylene glycol powder (GLYCOLAX/MIRALAX) 17 GM/SCOOP powder Measure  1 capful (17 g) & mix with 4 oz of water and drink by mouth daily. 238 g 1   potassium chloride (KLOR-CON) 10 MEQ tablet Take 1 tablet (10 mEq total) by mouth daily. 90 tablet 3   senna-docusate (SENOKOT-S) 8.6-50 MG tablet Take 1 tablet by mouth 2 (two) times daily.     apixaban (ELIQUIS) 5 MG TABS tablet Take 1 tablet (5 mg total) by mouth in the morning and at bedtime. 180 tablet 1   levETIRAcetam (KEPPRA)  250 MG tablet Take 1 tablet (250 mg total) by mouth 2 (two) times daily. Recommend Keppra  250 mg twice daily for a month followed by to 50 mg once daily and then discontinue (Patient not taking: Reported on 11/15/2022) 30 tablet 1   No current facility-administered medications for this visit.    Allergies: Patient has no known allergies.  Past Medical History:  Diagnosis Date   Anemia    Arthralgia of both knees    Arthritis    "right knee" (10/01/2015)   Atrial fibrillation (HCC)    Cancer (HCC)    STAGE 1 COLON CANCER   Chronic anticoagulation 2010   Coumadin   Chronic diastolic CHF (congestive heart failure), NYHA class 2 (HCC)    Dyspnea    Dysrhythmia    Hypertension    OSA (obstructive sleep apnea) 2016   "couldn't take the mask during the testing" (10/01/2015)   Permanent atrial fibrillation (HCC) 2010   Pneumonia 3/29/2017and june 2017    Past Surgical History:  Procedure Laterality Date   COLONOSCOPY WITH PROPOFOL N/A 03/04/2016   Procedure: COLONOSCOPY WITH PROPOFOL;  Surgeon: Jeani Hawking, MD;  Location: [facility];  Service: Endoscopy;  Laterality: N/A;   COLONOSCOPY WITH PROPOFOL N/A 09/15/2017   Procedure: COLONOSCOPY WITH PROPOFOL;  Surgeon: Jeani Hawking, MD;  Location: [facility];  Service: Endoscopy;  Laterality: N/A;   COLONOSCOPY WITH PROPOFOL N/A 02/12/2021   Procedure: COLONOSCOPY WITH PROPOFOL;  Surgeon: Jeani Hawking, MD;  Location: [facility];  Service: Endoscopy;  Laterality: N/A;   LAPAROSCOPIC PARTIAL COLECTOMY N/A 04/12/2016   Procedure: LAPAROSCOPIC ILEOCOLECTOMY;  Surgeon: Almond Lint, MD;  Location: [facility];  Service: General;  Laterality: N/A;   PORT-A-CATH REMOVAL N/A 02/16/2017   Procedure: REMOVAL PORT-A-CATH;  Surgeon: Almond Lint, MD;  Location: [facility];  Service: General;  Laterality: N/A;   PORTACATH PLACEMENT N/A 05/18/2016   Procedure: INSERTION PORT-A-CATH;  Surgeon: Almond Lint, MD;  Location: [facility];  Service:  General;  Laterality: N/A;   THORACOTOMY Right 2010    Family History  Problem Relation Age of Onset   Heart disease Father    Heart failure Father    Colon polyps Father        unspecified number - pt of intestine surgically resected   Hypertension Mother    Diabetes Mother    Hyperlipidemia Mother    Colon polyps Mother        unspecified number - pt of intestine surgically resected   Dementia Mother        d. 8   Stroke Maternal Grandmother    Stroke Sister    Colon cancer Sister 8       w/ "cancerous polyps" - unspecified number; underwent surgery and radiation   Colon polyps Brother        unspecified  number - polypectomies   Colon cancer Sister        dx 36-60; s/p surgery and radiation   Bone cancer Maternal Uncle        dx. 60s   Lung cancer Paternal Grandfather        d. 80s-90s; lung cancer vs TB   Colon cancer Sister        dx. 47-60; s/p surgery and radiation   Cancer Maternal Uncle        mother had about 7-8 other siblings, most passed at older ages, many of whom had some type of cancer   Heart attack Neg Hx     Social History   Tobacco Use   Smoking status: Former    Packs/day: 0.10    Years: 2.00    Additional pack years: 0.00    Total pack years: 0.20    Types: Cigarettes    Quit date: 12/16/1974    Years since quitting: 47.9   Smokeless tobacco: Never   Tobacco comments:    "1 pack cigarettes would last me a month"  Substance Use Topics   Alcohol use: No    Subjective:   Accompanied by daughter; Cough x 2-3 months; occasionally productive; does have albuterol inhaler but not using regularly- did not find it beneficial; no fever;   Objective:  Vitals:   11/15/22 1306  BP: 136/72  Pulse: 80  SpO2: 96%  Weight: 270 lb 12.8 oz (122.8 kg)  Height: 5\' 10"  (1.778 m)    General: Well developed, well nourished, in no acute distress  Skin : Warm and dry.  Head: Normocephalic and atraumatic  Eyes: Sclera and conjunctiva clear; pupils round  and reactive to light; extraocular movements intact  Ears: External normal; canals clear; tympanic membranes normal  Oropharynx: Pink, supple. No suspicious lesions  Neck: Supple without thyromegaly, adenopathy  Lungs: Respirations unlabored; clear to auscultation bilaterally without wheeze, rales, rhonchi  CVS exam: normal rate and regular rhythm.  Neurologic: Alert and oriented; speech intact; face symmetrical; moves all extremities well; CNII-XII intact without focal deficit   Assessment:  1. Chronic cough     Plan:  Update CXR today; Rx for Doxycycline and Prednisone and Tessalon Perles; increase fluids, rest and follow up to be determined based on CXR;   No follow-ups on file.  Orders Placed This Encounter  Procedures   DG Chest 2 View    Standing Status:   Future    Number of Occurrences:   1    Standing Expiration Date:   11/15/2023    Order Specific Question:   Reason for Exam (SYMPTOM  OR DIAGNOSIS REQUIRED)    Answer:   cough x 2 months    Order Specific Question:   Preferred imaging location?    Answer:   [facility]    Requested Prescriptions   Signed Prescriptions Disp Refills   doxycycline (VIBRA-TABS) 100 MG tablet 14 tablet 0    Sig: Take 1 tablet (100 mg total) by mouth 2 (two) times daily.   methylPREDNISolone (MEDROL DOSEPAK) 4 MG TBPK tablet 21 tablet 0    Sig: Take as directed on pack for 6 days.   benzonatate (TESSALON) 100 MG capsule 20 capsule 0    Sig: Take 1 capsule (100 mg total) by mouth 3 (three) times daily as needed.

## 2022-11-21 ENCOUNTER — Telehealth: Payer: Self-pay | Admitting: Family

## 2022-11-21 ENCOUNTER — Other Ambulatory Visit (HOSPITAL_BASED_OUTPATIENT_CLINIC_OR_DEPARTMENT_OTHER): Payer: Self-pay | Admitting: Family

## 2022-11-21 DIAGNOSIS — I5032 Chronic diastolic (congestive) heart failure: Secondary | ICD-10-CM

## 2022-11-21 NOTE — Telephone Encounter (Signed)
Rx(s) sent to pharmacy electronically.  

## 2022-11-21 NOTE — Telephone Encounter (Signed)
Called pts daughter back to go over lab results left a VM.

## 2022-11-21 NOTE — Telephone Encounter (Addendum)
Steven Barnett (daughter [facility]) called to go over imaging results. All CMAs were unavailable at the time and advised Steven Barnett a message would be sent to give her a call back at the following number:  [phone]

## 2022-11-22 ENCOUNTER — Telehealth: Payer: Self-pay

## 2022-11-22 ENCOUNTER — Telehealth: Payer: Self-pay | Admitting: Internal Medicine

## 2022-11-22 ENCOUNTER — Other Ambulatory Visit: Payer: Self-pay | Admitting: Internal Medicine

## 2022-11-22 DIAGNOSIS — I4891 Unspecified atrial fibrillation: Secondary | ICD-10-CM

## 2022-11-22 NOTE — Telephone Encounter (Signed)
Patient's daughter is returning call.

## 2022-11-22 NOTE — Telephone Encounter (Signed)
Spoke with pts daughter, Victorino Dike is aware of results and expressed understanding, she states pts cough is a "little better" "not as bad as it was". Pts daughter was also advised of pts enlarged heart, she states she will call cardiology and get a follow up appt scheduled.

## 2022-11-22 NOTE — Telephone Encounter (Signed)
Eliquis 5mg  refill request received. Patient is 72 years old, weight-122.8kg, Crea-1.06 on 07/12/22, Diagnosis-Afib, and last seen by Dr. Rennis Golden on 09/19/22. Dose is appropriate based on dosing criteria. Will send in refill to requested pharmacy.

## 2022-11-22 NOTE — Telephone Encounter (Signed)
Called and left message- but noticed that Darene Lamer had already addressed this

## 2022-11-22 NOTE — Telephone Encounter (Signed)
New Message:      Patient daughter called and said patient had chest xray on 11-15-22 ordered by his primary provider. She said the NP said patient need to see his Cardiologist. His daughter said the xray showed that his heart was enlarged., Appointment was made for patient on 6-17+24 with Gillian Shields. Daughter would like to know if that is too long for patient to wait to be seen.

## 2022-11-22 NOTE — Telephone Encounter (Signed)
Will route to primary MD to review chest xray results. To advise if sooner appointment was needed.  Scheduled with NP on 12/16/2022. Thanks!

## 2022-11-22 NOTE — Telephone Encounter (Signed)
-----   Message from Olive Bass, FNP sent at 11/21/2022  1:35 PM EDT ----- No infection noted; how is he doing as far as the cough goes? Heart is enlarged- would recommend that he see his cardiologist to follow up. That is Dr. Rennis Golden from what I can see.

## 2022-11-22 NOTE — Telephone Encounter (Signed)
Daughter states can LM when return call as she is returning to work

## 2022-11-22 NOTE — Telephone Encounter (Signed)
Spoke with Victorino Dike (pts daughter) please see phone note.

## 2022-11-23 NOTE — Telephone Encounter (Signed)
Message has been sent to primary and waiting on response

## 2022-11-24 NOTE — Telephone Encounter (Signed)
Per patient schedule message, patient is following up.  "Hello,    Checking if doctor review his chest X-ray please let us know because of the results"

## 2022-11-24 NOTE — Telephone Encounter (Signed)
Multiple encounters open. See additional phone encounter for more details.  

## 2022-11-24 NOTE — Telephone Encounter (Signed)
Per patient schedule message, patient is following up.   "Hello,    Checking if doctor review his chest X-ray please let us know because of the results"  Daughter is following up on this.  Thank you!

## 2022-11-25 NOTE — Telephone Encounter (Signed)
[name], MD  You15 hours ago (9:23 PM)    That appointment date is appropriate and frankly as soon as we have available. I did look at the echo report - nothing to worry about. His last echo showed mild LVH and left atrial enlargement - each of these things could cause the heart to appear large on a chest x-ray.  Dr Glenetta Borg with pt's daughter, Victorino Dike (ok per [facility]) regarding results of recent chest x-ray. Able to move pt's appointment with Gillian Shields, NP up to next week on Wednesday. Daughter verbalizes understanding.

## 2022-11-30 ENCOUNTER — Encounter (HOSPITAL_BASED_OUTPATIENT_CLINIC_OR_DEPARTMENT_OTHER): Payer: Self-pay | Admitting: Family

## 2022-11-30 ENCOUNTER — Ambulatory Visit (HOSPITAL_BASED_OUTPATIENT_CLINIC_OR_DEPARTMENT_OTHER): Payer: Medicare HMO | Admitting: Family

## 2022-11-30 VITALS — BP 138/88 | HR 50 | Ht 70.0 in | Wt 275.0 lb

## 2022-11-30 DIAGNOSIS — I5032 Chronic diastolic (congestive) heart failure: Secondary | ICD-10-CM | POA: Diagnosis not present

## 2022-11-30 DIAGNOSIS — I10 Essential (primary) hypertension: Secondary | ICD-10-CM

## 2022-11-30 DIAGNOSIS — E876 Hypokalemia: Secondary | ICD-10-CM | POA: Diagnosis not present

## 2022-11-30 DIAGNOSIS — G4733 Obstructive sleep apnea (adult) (pediatric): Secondary | ICD-10-CM

## 2022-11-30 DIAGNOSIS — I4821 Permanent atrial fibrillation: Secondary | ICD-10-CM

## 2022-11-30 DIAGNOSIS — I700 Atherosclerosis of aorta: Secondary | ICD-10-CM

## 2022-11-30 DIAGNOSIS — D6859 Other primary thrombophilia: Secondary | ICD-10-CM

## 2022-11-30 DIAGNOSIS — E785 Hyperlipidemia, unspecified: Secondary | ICD-10-CM

## 2022-11-30 NOTE — Progress Notes (Unsigned)
Office Visit    Patient Name: Steven Barnett Date of Encounter: 12/01/2022  PCP:  Olive Bass, FNP   [facility]  Cardiologist:  [name], MD  Advanced Practice Provider:  No care team member to display Electrophysiologist:  None     Chief Complaint    Steven Barnett is a 72 y.o. male presents today for follow up after CXR   Past Medical History    Past Medical History:  Diagnosis Date   Anemia    Arthralgia of both knees    Arthritis    "right knee" (10/01/2015)   Atrial fibrillation (HCC)    Cancer (HCC)    STAGE 1 COLON CANCER   Chronic anticoagulation 2010   Coumadin   Chronic diastolic CHF (congestive heart failure), NYHA class 2 (HCC)    Dyspnea    Dysrhythmia    Hypertension    OSA (obstructive sleep apnea) 2016   "couldn't take the mask during the testing" (10/01/2015)   Permanent atrial fibrillation (HCC) 2010   Pneumonia 3/29/2017and june 2017   Past Surgical History:  Procedure Laterality Date   COLONOSCOPY WITH PROPOFOL N/A 03/04/2016   Procedure: COLONOSCOPY WITH PROPOFOL;  Surgeon: Jeani Hawking, MD;  Location: [facility];  Service: Endoscopy;  Laterality: N/A;   COLONOSCOPY WITH PROPOFOL N/A 09/15/2017   Procedure: COLONOSCOPY WITH PROPOFOL;  Surgeon: Jeani Hawking, MD;  Location: [facility];  Service: Endoscopy;  Laterality: N/A;   COLONOSCOPY WITH PROPOFOL N/A 02/12/2021   Procedure: COLONOSCOPY WITH PROPOFOL;  Surgeon: Jeani Hawking, MD;  Location: [facility];  Service: Endoscopy;  Laterality: N/A;   LAPAROSCOPIC PARTIAL COLECTOMY N/A 04/12/2016   Procedure: LAPAROSCOPIC ILEOCOLECTOMY;  Surgeon: Almond Lint, MD;  Location: [facility];  Service: General;  Laterality: N/A;   PORT-A-CATH REMOVAL N/A 02/16/2017   Procedure: REMOVAL PORT-A-CATH;  Surgeon: Almond Lint, MD;  Location: [facility];  Service: General;  Laterality: N/A;   PORTACATH PLACEMENT N/A 05/18/2016   Procedure: INSERTION PORT-A-CATH;  Surgeon: Almond Lint, MD;  Location: [facility];  Service: General;  Laterality: N/A;   THORACOTOMY Right 2010    Allergies  No Known Allergies  History of Present Illness    Steven Barnett is a 72 y.o. male with a hx of hypertension, sleep disorder consistent with OSA, diastolic hart failure with moderate LVH, ICH, seizure, permanent atrial fibrillation last seen 09/19/22.  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 He developed atrial fibrillation postoperatively. He has been in atrial fibrillation since that time. At that time he was diagnosed with diastolic heart failure and found to have moderate LVH by echocardiogram.    He established with Dr. Rennis Golden in 2017 after being incarcerated for several years prior. His Warfarin was not adjusted regularly during that time. He was treated for colon cancer in 2017.    He was hospitalized 01/14/21 due to symptomatic hypotension due to severe hypovolemia and AKI. AKI and BP improved with IVF. He was discharged on Diltiazem 120mg , Lasix 40mg  QD, Potassium. His Hydralazine, metolazone, metoprolol, and valsartan were discontinued.   Admitted 01/2022 with intracranial hemorrhage and subsequent seizure followed by neurology. Warfarin was transitioned to Eliquis.   Saw Dr. Rennis Golden 09/19/22 doing well from cardiac perspective and no changes made at that time.    Presents today for follow up with his son. Reviewed recent CXR with cardiomegaly. Discussed this is ongoing since 2010, not of concern, and  related to LVH and hypertension. Notes he has been taking Lasix BID x 30 days as he noted swelling which has improved. Reports no shortness of breath at rest and stable mild dyspnea with more than usual exertion. Reports no chest pain, pressure, or tightness. No  orthopnea, PND. Reports no palpitations.    EKGs/Labs/Other Studies Reviewed:   The following studies were reviewed today: Cardiac Studies & Procedures      STRESS TESTS  MYOCARDIAL PERFUSION IMAGING 02/26/2015  Narrative  This is a low risk study.  Non-gated due to atrial fibrillation  No reversible perfusion defects  Low risk study. No reversible ischemia. Non-gated due to atrial fibrillation.   ECHOCARDIOGRAM  ECHOCARDIOGRAM COMPLETE 10/10/2021  Narrative ECHOCARDIOGRAM REPORT    Patient Name:   Steven Barnett Date of Exam: 10/10/2021 Medical Rec #:  [id number]      Height:       70.0 in Accession #:    [id number]     Weight:       280.0 lb Date of Birth:  May 24, 1951      BSA:          2.408 m Patient Age:    70 years       BP:           120/74 mmHg Patient Gender: M              HR:           79 bpm. Exam Location:  Inpatient  Procedure: 2D Echo, Color Doppler and Cardiac Doppler  Indications:    Stroke i63.9  History:        Patient has prior history of Echocardiogram examinations, most recent 11/04/2019. CHF, Arrythmias:Atrial Fibrillation; Risk Factors:Hypertension and Sleep Apnea.  Sonographer:    Irving Burton Senior RDCS Referring Phys: [id number] ASHISH ARORA   Sonographer Comments: Technically difficult due to body habitus. IMPRESSIONS   1. Left ventricular ejection fraction, by estimation, is 60 to 65%. The left ventricle has normal function. Left ventricular endocardial border not optimally defined to evaluate regional wall motion. There is mild concentric left ventricular hypertrophy. Left ventricular diastolic function could not be evaluated. 2. Right ventricular systolic function is normal. The right ventricular size is normal. There is normal pulmonary artery systolic pressure. The estimated right ventricular systolic pressure is 29.2 mmHg. 3. Left atrial size was mildly dilated. 4. The mitral valve is grossly normal. No evidence of mitral valve regurgitation. No evidence of mitral stenosis. 5. The aortic valve is tricuspid. Aortic valve regurgitation is not visualized. No aortic stenosis is present. 6. The  inferior vena cava is dilated in size with >50% respiratory variability, suggesting right atrial pressure of 8 mmHg.  Comparison(s): No significant change from prior study.  Conclusion(s)/Recommendation(s): No intracardiac source of embolism detected on this transthoracic study. Consider a transesophageal echocardiogram to exclude cardiac source of embolism if clinically indicated.  FINDINGS Left Ventricle: Left ventricular ejection fraction, by estimation, is 60 to 65%. The left ventricle has normal function. Left ventricular endocardial border not optimally defined to evaluate regional wall motion. The left ventricular internal cavity size was normal in size. There is mild concentric left ventricular hypertrophy. Left ventricular diastolic function could not be evaluated due to atrial fibrillation. Left ventricular diastolic function could not be evaluated.  Right Ventricle: The right ventricular size is normal. No increase in right ventricular wall thickness. Right ventricular systolic function is normal. There is normal pulmonary artery systolic pressure. The tricuspid regurgitant velocity is 2.30  m/s, and with an assumed right atrial pressure of 8 mmHg, the estimated right ventricular systolic pressure is 29.2 mmHg.  Left Atrium: Left atrial size was mildly dilated.  Right Atrium: Right atrial size was normal in size.  Pericardium: There is no evidence of pericardial effusion.  Mitral Valve: The mitral valve is grossly normal. No evidence of mitral valve regurgitation. No evidence of mitral valve stenosis.  Tricuspid Valve: The tricuspid valve is grossly normal. Tricuspid valve regurgitation is not demonstrated. No evidence of tricuspid stenosis.  Aortic Valve: The aortic valve is tricuspid. Aortic valve regurgitation is not visualized. No aortic stenosis is present.  Pulmonic Valve: The pulmonic valve was grossly normal. Pulmonic valve regurgitation is not visualized. No evidence of  pulmonic stenosis.  Aorta: The aortic root is normal in size and structure.  Venous: The inferior vena cava is dilated in size with greater than 50% respiratory variability, suggesting right atrial pressure of 8 mmHg.  IAS/Shunts: The atrial septum is grossly normal.   LEFT VENTRICLE PLAX 2D LVIDd:         4.00 cm LVIDs:         2.70 cm LV PW:         1.30 cm LV IVS:        1.20 cm LVOT diam:     2.10 cm LV SV:         49 LV SV Index:   20 LVOT Area:     3.46 cm   RIGHT VENTRICLE RV S prime:     7.28 cm/s TAPSE (M-mode): 1.8 cm  LEFT ATRIUM              Index        RIGHT ATRIUM           Index LA diam:        5.00 cm  2.08 cm/m   RA Area:     23.10 cm LA Vol (A2C):   122.0 ml 50.66 ml/m  RA Volume:   68.00 ml  28.24 ml/m LA Vol (A4C):   86.3 ml  35.84 ml/m LA Biplane Vol: 105.0 ml 43.60 ml/m AORTIC VALVE LVOT Vmax:   66.60 cm/s LVOT Vmean:  49.800 cm/s LVOT VTI:    0.142 m  TRICUSPID VALVE TR Peak grad:   21.2 mmHg TR Vmax:        230.00 cm/s  SHUNTS Systemic VTI:  0.14 m Systemic Diam: 2.10 cm  Lennie Odor MD Electronically signed by Lennie Odor MD Signature Date/Time: 10/10/2021/2:41:41 PM    Final              EKG:  EKG is not ordered today.    Recent Labs: 07/12/2022: Hemoglobin 14.4; Magnesium 1.9; Platelets 176.0 09/12/2022: ALT 20 11/30/2022: BNP 86.0; BUN 16; Creatinine, Ser 1.09; Potassium 4.2; Sodium 142  Recent Lipid Panel    Component Value Date/Time   CHOL 131 09/19/2022 0901   TRIG 144 09/19/2022 0901   HDL 43 09/19/2022 0901   CHOLHDL 3.0 09/19/2022 0901   CHOLHDL 2.7 10/10/2021 0434   VLDL 17 10/10/2021 0434   LDLCALC 63 09/19/2022 0901    Risk Assessment/Calculations:   CHA2DS2-VASc Score = 4   This indicates a 4.8% annual risk of stroke. The patient's score is based upon: CHF History: 1 HTN History: 1 Diabetes History: 0 Stroke History: 0 Vascular Disease History: 1 Age Score: 1 Gender Score: 0     Home  Medications   Current Meds  Medication  Sig   acetaminophen (TYLENOL) 500 MG tablet Take 1 tablet (500 mg total) by mouth every 6 (six) hours as needed. Take 1 tablet 500 mg 3 times daily x5 days, then every 6 hours as needed.   albuterol (VENTOLIN HFA) 108 (90 Base) MCG/ACT inhaler Inhale 2 puffs into the lungs every 6 (six) hours as needed for wheezing or shortness of breath.   allopurinol (ZYLOPRIM) 100 MG tablet Take 1 tablet by mouth once daily   apixaban (ELIQUIS) 5 MG TABS tablet TAKE 1 TABLET BY MOUTH IN THE MORNING AND AT BEDTIME   aspirin EC 81 MG tablet Take 81 mg by mouth every morning.   benzonatate (TESSALON) 100 MG capsule Take 1 capsule (100 mg total) by mouth 3 (three) times daily as needed.   Colchicine 0.6 MG CAPS Take 1 capsule (0.6 mg total) by mouth daily.   diclofenac Sodium (VOLTAREN) 1 % GEL Apply 2 g topically 4 (four) times daily as needed (for joint pain).   diltiazem (CARDIZEM) 120 MG tablet Take 1 tablet (120 mg total) by mouth 3 (three) times daily.   doxycycline (VIBRA-TABS) 100 MG tablet Take 1 tablet (100 mg total) by mouth 2 (two) times daily.   furosemide (LASIX) 40 MG tablet TAKE 1 TABLET EVERY DAY   levETIRAcetam (KEPPRA) 250 MG tablet Take 1 tablet (250 mg total) by mouth 2 (two) times daily. Recommend Keppra  250 mg twice daily for a month followed by to 50 mg once daily and then discontinue   lisinopril (ZESTRIL) 5 MG tablet Take 1 tablet (5 mg total) by mouth daily.   MAGNESIUM-OXIDE 400 (240 Mg) MG tablet Take 1 tablet by mouth 2 (two) times daily.   methylPREDNISolone (MEDROL DOSEPAK) 4 MG TBPK tablet Take as directed on pack for 6 days.   pantoprazole (PROTONIX) 40 MG tablet Take 1 tablet by mouth once daily   polyethylene glycol powder (GLYCOLAX/MIRALAX) 17 GM/SCOOP powder Measure  1 capful (17 g) & mix with 4 oz of water and drink by mouth daily.   potassium chloride (KLOR-CON) 10 MEQ tablet Take 1 tablet (10 mEq total) by mouth daily.    senna-docusate (SENOKOT-S) 8.6-50 MG tablet Take 1 tablet by mouth 2 (two) times daily.     Review of Systems      All other systems reviewed and are otherwise negative except as noted above.  Physical Exam    VS:  BP 138/88   Pulse (!) 50   Ht 5\' 10"  (1.778 m)   Wt 275 lb (124.7 kg)   BMI 39.46 kg/m  , BMI Body mass index is 39.46 kg/m.  Wt Readings from Last 3 Encounters:  11/30/22 275 lb (124.7 kg)  11/15/22 270 lb 12.8 oz (122.8 kg)  09/19/22 278 lb 6.4 oz (126.3 kg)     GEN: Well nourished, well developed, in no acute distress. HEENT: normal. Neck: Supple, no JVD, carotid bruits, or masses. Cardiac: RRR, no murmurs, rubs, or gallops. No clubbing, cyanosis, edema.  Radials/PT 2+ and equal bilaterally.  Respiratory:  Respirations regular and unlabored, clear to auscultation bilaterally. GI: Soft, nontender, nondistended. MS: No deformity or atrophy. Skin: Warm and dry, no rash. Neuro:  Strength and sensation are intact. Psych: Normal affect.  Assessment & Plan    Permanent atrial fibrillation / Hypercoagulable state - Rate controlled on Diltiazem 120mg  QD. CHA2DS2-VASc Score = 4 [CHF History: 1, HTN History: 1, Diabetes History: 0, Stroke History: 0, Vascular Disease History: 1, Age Score: 1, Gender  Score: 0].  Therefore, the patient's annual risk of stroke is 4.8 %.    Continue Eliquis 5mg  BID. No bleeding complications. Does not meet reduced dose criteria.   Morbid obesity - Weight loss via diet and exercise encouraged. Discussed the impact being overweight would have on cardiovascular risk.   OSA - Not on CPAP. Avoid supine sleeping position.    HTN / Hypokalemia - BP not at goal <130/80. Update BMP/BNP consider addition of Jardiance (preferred by insurance) vs Spironolactone pending result for BP control. Discussed to monitor BP at home at least 2 hours after medications and sitting for 5-10 minutes.   Diastolic heart failure / LE edema - No edema. However, has been  taking Lasix BID x 30 days instead of prescribed QD. GDMT presently includes Furosemide, Lisinopril. Update BNP/BMP and add Jardiance or Spironolactone pending results. Low sodium diet, fluid restriction <2L, and daily weights encouraged. Educated to contact our office for weight gain of 2 lbs overnight or 5 lbs in one week.   Aortic atherosclerosis / HLD, LDL goal <70 - Stable with no anginal symptoms. No indication for ischemic evaluation.  GDMT Aspirin. 09/2022 LDL 63, no indication for statin. Heart healthy diet and regular cardiovascular exercise encouraged.          Disposition: Follow up  in 3-4 months  with [name], MD or APP.  Signed, Alver Sorrow, NP 12/01/2022, 8:12 PM [facility]

## 2022-11-30 NOTE — Patient Instructions (Addendum)
Medication Instructions:  Your physician has recommended you make the following change in your medication:   REDUCE Furosemide (Lasix) to 40mg  daily in the morning  *If you need a refill on your cardiac medications before your next appointment, please call your pharmacy*   Lab Work: Your physician recommends that you return for lab work today: BMP, BNP  We will consider medication changes (such as adding Spironolactone or Farxiga) to help your heart muscle work more effectively and keep fluid off pending your lab results.   If you have labs (blood work) drawn today and your tests are completely normal, you will receive your results only by: MyChart Message (if you have MyChart) OR A paper copy in the mail If you have any lab test that is abnormal or we need to change your treatment, we will call you to review the results.   Testing/Procedures: Your chest xray has shown a mildly enlarged heart since 2010. This is likely related to LVH (left ventricular hypertrophy) which is thickening of the heart muscle from elevated blood pressure. Our blood pressure goal for you is less than 130/80.   Follow-Up: At [facility], you and your health needs are our priority.  As part of our continuing mission to provide you with exceptional heart care, we have created designated Provider Care Teams.  These Care Teams include your primary Cardiologist (physician) and Advanced Practice Providers (APPs -  Physician Assistants and Nurse Practitioners) who all work together to provide you with the care you need, when you need it.  We recommend signing up for the patient portal called "MyChart".  Sign up information is provided on this After Visit Summary.  MyChart is used to connect with patients for Virtual Visits (Telemedicine).  Patients are able to view lab/test results, encounter notes, upcoming appointments, etc.  Non-urgent messages can be sent to your provider as well.   To learn more about what  you can do with MyChart, go to [email or web address].    Your next appointment:   3-4 month(s)  Provider:   K. Italy Hilty, MD or Gillian Shields, NP    Other Instructions  Heart Healthy Diet Recommendations: A low-salt diet is recommended. Meats should be grilled, baked, or boiled. Avoid fried foods. Focus on lean protein sources like fish or chicken with vegetables and fruits. The American Heart Association is a Chief Technology Officer!  American Heart Association Diet and Lifeystyle Recommendations  Recommend restricting to less than 64 oz (2 liters) of fluid per day to help the heart work more effectively.   Tips to Measure your Blood Pressure Correctly Check blood pressure 3 times per week at least an hour after medications  Here's what you can do to ensure a correct reading:  Don't drink a caffeinated beverage or smoke during the 30 minutes before the test.  Sit quietly for five minutes before the test begins.  During the measurement, sit in a chair with your feet on the floor and your arm supported so your elbow is at about heart level.  The inflatable part of the cuff should completely cover at least 80% of your upper arm, and the cuff should be placed on bare skin, not over a shirt.  Don't talk during the measurement.  Blood pressure categories  Blood pressure category SYSTOLIC (upper number)  DIASTOLIC (lower number)  Normal Less than 120 mm Hg and Less than 80 mm Hg  Elevated 120-129 mm Hg and Less than 80 mm Hg  High blood pressure: Stage  1 hypertension 130-139 mm Hg or 80-89 mm Hg  High blood pressure: Stage 2 hypertension 140 mm Hg or higher or 90 mm Hg or higher  Hypertensive crisis (consult your doctor immediately) Higher than 180 mm Hg and/or Higher than 120 mm Hg  Source: American Heart Association and American Stroke Association. For more on getting your blood pressure under control, buy Controlling Your Blood Pressure, a Special Health Report from [facility].   Blood Pressure Log   Date   Time  Blood Pressure  Example: Nov 1 9 AM 124/78

## 2022-12-01 ENCOUNTER — Encounter (HOSPITAL_BASED_OUTPATIENT_CLINIC_OR_DEPARTMENT_OTHER): Payer: Self-pay | Admitting: Family

## 2022-12-01 LAB — BASIC METABOLIC PANEL
BUN/Creatinine Ratio: 15 (ref 10–24)
BUN: 16 mg/dL (ref 8–27)
CO2: 26 mmol/L (ref 20–29)
Calcium: 9.5 mg/dL (ref 8.6–10.2)
Chloride: 99 mmol/L (ref 96–106)
Creatinine, Ser: 1.09 mg/dL (ref 0.76–1.27)
Glucose: 67 mg/dL — ABNORMAL LOW (ref 70–99)
Potassium: 4.2 mmol/L (ref 3.5–5.2)
Sodium: 142 mmol/L (ref 134–144)
eGFR: 73 mL/min/{1.73_m2} (ref >59)

## 2022-12-01 LAB — BRAIN NATRIURETIC PEPTIDE: BNP: 86 pg/mL (ref 0.0–100.0)

## 2022-12-02 ENCOUNTER — Other Ambulatory Visit (HOSPITAL_COMMUNITY): Payer: Self-pay

## 2022-12-02 ENCOUNTER — Telehealth (HOSPITAL_BASED_OUTPATIENT_CLINIC_OR_DEPARTMENT_OTHER): Payer: Self-pay

## 2022-12-02 ENCOUNTER — Telehealth: Payer: Self-pay

## 2022-12-02 DIAGNOSIS — I10 Essential (primary) hypertension: Secondary | ICD-10-CM

## 2022-12-02 MED ORDER — EMPAGLIFLOZIN 10 MG PO TABS: 10.0000 mg | ORAL_TABLET | Freq: Every day | ORAL | 3 refills | Status: DC

## 2022-12-02 NOTE — Telephone Encounter (Signed)
I'm a bit confused as on Q1 Medicare his formulary has Jardiance listed as $0 for 30 or 90 day supply. CCIng pharmacy team to see if they have any input.   Marcelline Deist is $100/30 days and $290/90 days. Can we confirm that the pharmacy is running [place] and NOT [place]?  Alver Sorrow, NP

## 2022-12-02 NOTE — Telephone Encounter (Signed)
Please advise 

## 2022-12-02 NOTE — Telephone Encounter (Signed)
   JARDIANCE IS 300FOR A 90DAY AND 100.00 FOR 30DAY SUPPLY NO P/A REQ

## 2022-12-02 NOTE — Telephone Encounter (Addendum)
Results called to patient, spoke with his daughter (ok per DPR), she is agreeable to medication changes and repeat labs. Informed her that we will send information over to PA team, she understands to leave pharm until we call her with approval. Labs mailed to patient.    ----- Message from Alver Sorrow, NP sent at 12/01/2022  8:26 PM EDT ----- Normal kidney function and electrolytes. BNP with no significant volume overload. Continue Lasix just daily. Start Jardiance 10mg  QD with BMP in 1-2 weeks.

## 2022-12-06 ENCOUNTER — Other Ambulatory Visit (HOSPITAL_COMMUNITY): Payer: Self-pay

## 2022-12-07 ENCOUNTER — Other Ambulatory Visit (HOSPITAL_COMMUNITY): Payer: Self-pay

## 2022-12-07 NOTE — Telephone Encounter (Signed)
Spoke to insurance again this morning with different information. However;  I called the pts pref'd pharmacy and the rx for jardiance has been filled fora 90day supply for 0.00

## 2022-12-19 ENCOUNTER — Ambulatory Visit (HOSPITAL_BASED_OUTPATIENT_CLINIC_OR_DEPARTMENT_OTHER): Payer: Medicare HMO | Admitting: Family

## 2022-12-20 ENCOUNTER — Other Ambulatory Visit: Payer: Self-pay | Admitting: Family

## 2023-02-17 ENCOUNTER — Other Ambulatory Visit: Payer: Self-pay | Admitting: Family

## 2023-02-23 ENCOUNTER — Encounter: Payer: Self-pay | Admitting: Hematology

## 2023-02-28 ENCOUNTER — Ambulatory Visit (HOSPITAL_BASED_OUTPATIENT_CLINIC_OR_DEPARTMENT_OTHER): Payer: Medicare HMO | Admitting: Family

## 2023-04-24 ENCOUNTER — Other Ambulatory Visit: Payer: Self-pay | Admitting: Family

## 2023-04-27 ENCOUNTER — Encounter: Payer: Self-pay | Admitting: Nurse Practitioner

## 2023-04-27 ENCOUNTER — Ambulatory Visit: Payer: Medicare HMO | Attending: Family | Admitting: Nurse Practitioner

## 2023-04-27 VITALS — BP 112/78 | HR 80 | Ht 70.0 in | Wt 272.0 lb

## 2023-04-27 DIAGNOSIS — E785 Hyperlipidemia, unspecified: Secondary | ICD-10-CM

## 2023-04-27 DIAGNOSIS — I10 Essential (primary) hypertension: Secondary | ICD-10-CM

## 2023-04-27 DIAGNOSIS — I4821 Permanent atrial fibrillation: Secondary | ICD-10-CM

## 2023-04-27 DIAGNOSIS — G4733 Obstructive sleep apnea (adult) (pediatric): Secondary | ICD-10-CM | POA: Diagnosis not present

## 2023-04-27 DIAGNOSIS — I5032 Chronic diastolic (congestive) heart failure: Secondary | ICD-10-CM | POA: Diagnosis not present

## 2023-04-27 NOTE — Patient Instructions (Signed)
Medication Instructions:  Your physician recommends that you continue on your current medications as directed. Please refer to the Current Medication list given to you today.   *If you need a refill on your cardiac medications before your next appointment, please call your pharmacy*   Lab Work: NONE ordered at this time of appointment    Testing/Procedures: NONE ordered at this time of appointment     Follow-Up: At [facility], you and your health needs are our priority.  As part of our continuing mission to provide you with exceptional heart care, we have created designated Provider Care Teams.  These Care Teams include your primary Cardiologist (physician) and Advanced Practice Providers (APPs -  Physician Assistants and Nurse Practitioners) who all work together to provide you with the care you need, when you need it.  We recommend signing up for the patient portal called "MyChart".  Sign up information is provided on this After Visit Summary.  MyChart is used to connect with patients for Virtual Visits (Telemedicine).  Patients are able to view lab/test results, encounter notes, upcoming appointments, etc.  Non-urgent messages can be sent to your provider as well.   To learn more about what you can do with MyChart, go to [email or web address].    Your next appointment:   6 month(s)  Provider:   [name], MD

## 2023-04-27 NOTE — Progress Notes (Addendum)
Office Visit    Patient Name: Steven Barnett Date of Encounter: 04/27/2023  Primary Care Provider:  Olive Bass, FNP Primary Cardiologist:  [name], MD  Chief Complaint    72 year old male with a history of chronic diastolic heart failure, permanent atrial fibrillation, hypertension, hyperlipidemia, intracranial hemorrhage, colon cancer, and OSA who presents for follow-up related to heart failure and atrial fibrillation.  Past Medical History    Past Medical History:  Diagnosis Date   Anemia    Arthralgia of both knees    Arthritis    "right knee" (10/01/2015)   Atrial fibrillation (HCC)    Cancer (HCC)    STAGE 1 COLON CANCER   Chronic anticoagulation 2010   Coumadin   Chronic diastolic CHF (congestive heart failure), NYHA class 2 (HCC)    Dyspnea    Dysrhythmia    Hypertension    OSA (obstructive sleep apnea) 2016   "couldn't take the mask during the testing" (10/01/2015)   Permanent atrial fibrillation (HCC) 2010   Pneumonia 3/29/2017and june 2017   Past Surgical History:  Procedure Laterality Date   COLONOSCOPY WITH PROPOFOL N/A 03/04/2016   Procedure: COLONOSCOPY WITH PROPOFOL;  Surgeon: Jeani Hawking, MD;  Location: [facility];  Service: Endoscopy;  Laterality: N/A;   COLONOSCOPY WITH PROPOFOL N/A 09/15/2017   Procedure: COLONOSCOPY WITH PROPOFOL;  Surgeon: Jeani Hawking, MD;  Location: [facility];  Service: Endoscopy;  Laterality: N/A;   COLONOSCOPY WITH PROPOFOL N/A 02/12/2021   Procedure: COLONOSCOPY WITH PROPOFOL;  Surgeon: Jeani Hawking, MD;  Location: [facility];  Service: Endoscopy;  Laterality: N/A;   LAPAROSCOPIC PARTIAL COLECTOMY N/A 04/12/2016   Procedure: LAPAROSCOPIC ILEOCOLECTOMY;  Surgeon: Almond Lint, MD;  Location: [facility];  Service: General;  Laterality: N/A;   PORT-A-CATH REMOVAL N/A 02/16/2017   Procedure: REMOVAL PORT-A-CATH;  Surgeon: Almond Lint, MD;  Location: [facility];  Service: General;  Laterality: N/A;   PORTACATH  PLACEMENT N/A 05/18/2016   Procedure: INSERTION PORT-A-CATH;  Surgeon: Almond Lint, MD;  Location: [facility];  Service: General;  Laterality: N/A;   THORACOTOMY Right 2010    Allergies  No Known Allergies   Labs/Other Studies Reviewed    The following studies were reviewed today:  Cardiac Studies & Procedures     STRESS TESTS  MYOCARDIAL PERFUSION IMAGING 02/26/2015  Narrative  This is a low risk study.  Non-gated due to atrial fibrillation  No reversible perfusion defects  Low risk study. No reversible ischemia. Non-gated due to atrial fibrillation.   ECHOCARDIOGRAM  ECHOCARDIOGRAM COMPLETE 10/10/2021  Narrative ECHOCARDIOGRAM REPORT    Patient Name:   Steven Barnett Date of Exam: 10/10/2021 Medical Rec #:  [id number]      Height:       70.0 in Accession #:    [id number]     Weight:       280.0 lb Date of Birth:  1951-05-27      BSA:          2.408 m Patient Age:    70 years       BP:           120/74 mmHg Patient Gender: M              HR:           79 bpm. Exam Location:  Inpatient  Procedure: 2D Echo, Color Doppler and Cardiac Doppler  Indications:    Stroke i63.9  History:  Patient has prior history of Echocardiogram examinations, most recent 11/04/2019. CHF, Arrythmias:Atrial Fibrillation; Risk Factors:Hypertension and Sleep Apnea.  Sonographer:    Irving Burton Senior RDCS Referring Phys: [id number] ASHISH ARORA   Sonographer Comments: Technically difficult due to body habitus. IMPRESSIONS   1. Left ventricular ejection fraction, by estimation, is 60 to 65%. The left ventricle has normal function. Left ventricular endocardial border not optimally defined to evaluate regional wall motion. There is mild concentric left ventricular hypertrophy. Left ventricular diastolic function could not be evaluated. 2. Right ventricular systolic function is normal. The right ventricular size is normal. There is normal pulmonary artery systolic pressure. The  estimated right ventricular systolic pressure is 29.2 mmHg. 3. Left atrial size was mildly dilated. 4. The mitral valve is grossly normal. No evidence of mitral valve regurgitation. No evidence of mitral stenosis. 5. The aortic valve is tricuspid. Aortic valve regurgitation is not visualized. No aortic stenosis is present. 6. The inferior vena cava is dilated in size with >50% respiratory variability, suggesting right atrial pressure of 8 mmHg.  Comparison(s): No significant change from prior study.  Conclusion(s)/Recommendation(s): No intracardiac source of embolism detected on this transthoracic study. Consider a transesophageal echocardiogram to exclude cardiac source of embolism if clinically indicated.  FINDINGS Left Ventricle: Left ventricular ejection fraction, by estimation, is 60 to 65%. The left ventricle has normal function. Left ventricular endocardial border not optimally defined to evaluate regional wall motion. The left ventricular internal cavity size was normal in size. There is mild concentric left ventricular hypertrophy. Left ventricular diastolic function could not be evaluated due to atrial fibrillation. Left ventricular diastolic function could not be evaluated.  Right Ventricle: The right ventricular size is normal. No increase in right ventricular wall thickness. Right ventricular systolic function is normal. There is normal pulmonary artery systolic pressure. The tricuspid regurgitant velocity is 2.30 m/s, and with an assumed right atrial pressure of 8 mmHg, the estimated right ventricular systolic pressure is 29.2 mmHg.  Left Atrium: Left atrial size was mildly dilated.  Right Atrium: Right atrial size was normal in size.  Pericardium: There is no evidence of pericardial effusion.  Mitral Valve: The mitral valve is grossly normal. No evidence of mitral valve regurgitation. No evidence of mitral valve stenosis.  Tricuspid Valve: The tricuspid valve is grossly  normal. Tricuspid valve regurgitation is not demonstrated. No evidence of tricuspid stenosis.  Aortic Valve: The aortic valve is tricuspid. Aortic valve regurgitation is not visualized. No aortic stenosis is present.  Pulmonic Valve: The pulmonic valve was grossly normal. Pulmonic valve regurgitation is not visualized. No evidence of pulmonic stenosis.  Aorta: The aortic root is normal in size and structure.  Venous: The inferior vena cava is dilated in size with greater than 50% respiratory variability, suggesting right atrial pressure of 8 mmHg.  IAS/Shunts: The atrial septum is grossly normal.   LEFT VENTRICLE PLAX 2D LVIDd:         4.00 cm LVIDs:         2.70 cm LV PW:         1.30 cm LV IVS:        1.20 cm LVOT diam:     2.10 cm LV SV:         49 LV SV Index:   20 LVOT Area:     3.46 cm   RIGHT VENTRICLE RV S prime:     7.28 cm/s TAPSE (M-mode): 1.8 cm  LEFT ATRIUM  Index        RIGHT ATRIUM           Index LA diam:        5.00 cm  2.08 cm/m   RA Area:     23.10 cm LA Vol (A2C):   122.0 ml 50.66 ml/m  RA Volume:   68.00 ml  28.24 ml/m LA Vol (A4C):   86.3 ml  35.84 ml/m LA Biplane Vol: 105.0 ml 43.60 ml/m AORTIC VALVE LVOT Vmax:   66.60 cm/s LVOT Vmean:  49.800 cm/s LVOT VTI:    0.142 m  TRICUSPID VALVE TR Peak grad:   21.2 mmHg TR Vmax:        230.00 cm/s  SHUNTS Systemic VTI:  0.14 m Systemic Diam: 2.10 cm  Lennie Odor MD Electronically signed by Lennie Odor MD Signature Date/Time: 10/10/2021/2:41:41 PM    Final            Recent Labs: 07/12/2022: Hemoglobin 14.4; Magnesium 1.9; Platelets 176.0 09/12/2022: ALT 20 11/30/2022: BNP 86.0; BUN 16; Creatinine, Ser 1.09; Potassium 4.2; Sodium 142  Recent Lipid Panel    Component Value Date/Time   CHOL 131 09/19/2022 0901   TRIG 144 09/19/2022 0901   HDL 43 09/19/2022 0901   CHOLHDL 3.0 09/19/2022 0901   CHOLHDL 2.7 10/10/2021 0434   VLDL 17 10/10/2021 0434   LDLCALC 63 09/19/2022  0901    History of Present Illness    72 year old male with the above past medical history including chronic diastolic heart failure, permanent atrial fibrillation, hypertension, hyperlipidemia, intracranial hemorrhage, colon cancer, and OSA.  He was hospitalized in 2010 in the setting of right empyema s/p thoracotomy.  He developed postop atrial fibrillation and atrial fibrillation since.  He was also diagnosed with diastolic heart failure at the time and found to have moderate LVH by echocardiogram.  He established with Dr. Rennis Golden in 2017.  He was hospitalized in 2022 in the setting of symptomatic hypotension due to severe hypovolemia, AKI.  He was hospitalized in 01/20/2022 in the setting of intracranial hemorrhage and procedure.  Warfarin was transitioned to Eliquis at the time.  He was last seen in the office on 11/30/2022 and was stable from a cardiac standpoint.  He presents today for follow-up accompanied by his son and newborn granddaughter.  Since his last visit he has been stable from a cardiac standpoint.  He denies any chest pain, dyspnea, edema, PND, orthopnea, weight gain.  Denies palpitations, dizziness.  He did have a recent gout flare after he ran out of his colchicine and allopurinol.  He is back on his medications and doing better.  Overall, he reports feeling well.  Home Medications    Current Outpatient Medications  Medication Sig Dispense Refill   acetaminophen (TYLENOL) 500 MG tablet Take 1 tablet (500 mg total) by mouth every 6 (six) hours as needed. Take 1 tablet 500 mg 3 times daily x5 days, then every 6 hours as needed.  0   albuterol (VENTOLIN HFA) 108 (90 Base) MCG/ACT inhaler Inhale 2 puffs into the lungs every 6 (six) hours as needed for wheezing or shortness of breath. 1 each 2   allopurinol (ZYLOPRIM) 100 MG tablet Take 1 tablet by mouth once daily 90 tablet 0   apixaban (ELIQUIS) 5 MG TABS tablet TAKE 1 TABLET BY MOUTH IN THE MORNING AND AT BEDTIME 180 tablet 1    aspirin EC 81 MG tablet Take 81 mg by mouth every morning.     Colchicine  0.6 MG CAPS Take 1 capsule (0.6 mg total) by mouth daily. 90 capsule 0   diltiazem (CARDIZEM) 120 MG tablet Take 1 tablet (120 mg total) by mouth 3 (three) times daily. 270 tablet 1   empagliflozin (JARDIANCE) 10 MG TABS tablet Take 1 tablet (10 mg total) by mouth daily before breakfast. 90 tablet 3   furosemide (LASIX) 40 MG tablet TAKE 1 TABLET EVERY DAY 90 tablet 2   lisinopril (ZESTRIL) 5 MG tablet Take 1 tablet by mouth once daily 90 tablet 0   MAGNESIUM-OXIDE 400 (240 Mg) MG tablet Take 1 tablet by mouth 2 (two) times daily.     methylPREDNISolone (MEDROL DOSEPAK) 4 MG TBPK tablet Take as directed on pack for 6 days. 21 tablet 0   pantoprazole (PROTONIX) 40 MG tablet Take 1 tablet by mouth once daily 90 tablet 0   polyethylene glycol powder (GLYCOLAX/MIRALAX) 17 GM/SCOOP powder Measure  1 capful (17 g) & mix with 4 oz of water and drink by mouth daily. 238 g 1   potassium chloride (KLOR-CON) 10 MEQ tablet Take 1 tablet (10 mEq total) by mouth daily. 90 tablet 3   senna-docusate (SENOKOT-S) 8.6-50 MG tablet Take 1 tablet by mouth 2 (two) times daily.     benzonatate (TESSALON) 100 MG capsule Take 1 capsule (100 mg total) by mouth 3 (three) times daily as needed. (Patient not taking: Reported on 04/27/2023) 20 capsule 0   diclofenac Sodium (VOLTAREN) 1 % GEL Apply 2 g topically 4 (four) times daily as needed (for joint pain). (Patient not taking: Reported on 04/27/2023) 100 g 0   doxycycline (VIBRA-TABS) 100 MG tablet Take 1 tablet (100 mg total) by mouth 2 (two) times daily. (Patient not taking: Reported on 04/27/2023) 14 tablet 0   levETIRAcetam (KEPPRA) 250 MG tablet Take 1 tablet (250 mg total) by mouth 2 (two) times daily. Recommend Keppra  250 mg twice daily for a month followed by to 50 mg once daily and then discontinue (Patient not taking: Reported on 04/27/2023) 30 tablet 1   No current facility-administered  medications for this visit.     Review of Systems    He denies chest pain, palpitations, dyspnea, pnd, orthopnea, n, v, dizziness, syncope, edema, weight gain, or early satiety. All other systems reviewed and are otherwise negative except as noted above.   Physical Exam    VS:  BP 112/78 (BP Location: Left Arm, Patient Position: Sitting, Cuff Size: Large)   Pulse 80   Ht 5\' 10"  (1.778 m)   Wt 272 lb (123.4 kg)   BMI 39.03 kg/m   GEN: Well nourished, well developed, in no acute distress. HEENT: normal. Neck: Supple, no JVD, carotid bruits, or masses. Cardiac: IRIR, no murmurs, rubs, or gallops. No clubbing, cyanosis, edema.  Radials/DP/PT 2+ and equal bilaterally.  Respiratory:  Respirations regular and unlabored, clear to auscultation bilaterally. GI: Soft, nontender, nondistended, BS + x 4. MS: no deformity or atrophy. Skin: warm and dry, no rash. Neuro:  Strength and sensation are intact. Psych: Normal affect.  Accessory Clinical Findings    ECG personally reviewed by me today - EKG Interpretation Date/Time:  Thursday April 27 2023 11:22:50 EDT Ventricular Rate:  80 PR Interval:    QRS Duration:  104 QT Interval:  392 QTC Calculation: 452 R Axis:   -1  Text Interpretation: Atrial fibrillation No significant change since last tracing Confirmed by [name] ([id number]) on 04/27/2023 11:28:28 AM  - no acute changes.   Lab  Results  Component Value Date   WBC 5.7 07/12/2022   HGB 14.4 07/12/2022   HCT 44.5 07/12/2022   MCV 83.9 07/12/2022   PLT 176.0 07/12/2022   Lab Results  Component Value Date   CREATININE 1.09 11/30/2022   BUN 16 11/30/2022   NA 142 11/30/2022   K 4.2 11/30/2022   CL 99 11/30/2022   CO2 26 11/30/2022   Lab Results  Component Value Date   ALT 20 09/12/2022   AST 19 09/12/2022   ALKPHOS 127 (H) 09/12/2022   BILITOT 0.7 09/12/2022   Lab Results  Component Value Date   CHOL 131 09/19/2022   HDL 43 09/19/2022   LDLCALC 63 09/19/2022    TRIG 144 09/19/2022   CHOLHDL 3.0 09/19/2022    Lab Results  Component Value Date   HGBA1C 5.0 10/10/2021    Assessment & Plan   1. Chronic diastolic heart failure: Echo normal RV, no significant valvular abnormalities.  Echocardiogram in 10/2021 showed EF 60 to 65%, normal LV function, mild concentric LVH, normal RV, no significant valvular abnormality. Euvolemic and well compensated on exam.  Continue lisinopril, Jardiance and Lasix.   2. Permanent atrial fibrillation: Rate controlled.  Transitioned from warfarin to Eliquis in the setting of prior ICH.  Continue diltiazem, Eliquis.  3. Hypertension: BP well controlled. Continue current antihypertensive regimen.   4. Hyperlipidemia: LDL was 63 in 09/2022. Not currently on statin therapy.   5. OSA: Not on CPAP.   6. Disposition: Follow-up in 6 months with Dr. Rennis Golden.       Joylene Grapes, NP 04/27/2023, 12:00 PM

## 2023-05-13 ENCOUNTER — Other Ambulatory Visit: Payer: Self-pay | Admitting: Internal Medicine

## 2023-05-13 DIAGNOSIS — I4891 Unspecified atrial fibrillation: Secondary | ICD-10-CM

## 2023-05-15 NOTE — Telephone Encounter (Signed)
Prescription refill request for Eliquis received. Indication:afib Last office visit:10/24 Scr:1.09  5/24 Age: 72 Weight:123.4  kg  Prescription refilled

## 2023-05-31 ENCOUNTER — Other Ambulatory Visit: Payer: Self-pay | Admitting: Family

## 2023-06-08 ENCOUNTER — Other Ambulatory Visit: Payer: Self-pay | Admitting: Family

## 2023-06-18 ENCOUNTER — Other Ambulatory Visit: Payer: Self-pay | Admitting: Internal Medicine

## 2023-06-18 DIAGNOSIS — I5032 Chronic diastolic (congestive) heart failure: Secondary | ICD-10-CM

## 2023-06-20 ENCOUNTER — Telehealth: Payer: Self-pay | Admitting: Internal Medicine

## 2023-06-20 DIAGNOSIS — I5032 Chronic diastolic (congestive) heart failure: Secondary | ICD-10-CM

## 2023-06-20 MED ORDER — FUROSEMIDE 40 MG PO TABS
40.0000 mg | ORAL_TABLET | Freq: Every day | ORAL | 2 refills | Status: DC
Start: 2023-06-20 — End: 2023-06-26

## 2023-06-20 NOTE — Telephone Encounter (Signed)
Pt's medication was sent to pt's pharmacy as requested. Confirmation received.  °

## 2023-06-20 NOTE — Telephone Encounter (Signed)
*  STAT* If patient is at the pharmacy, call can be transferred to refill team.   1. Which medications need to be refilled? (please list name of each medication and dose if known) furosemide (LASIX) 40 MG tablet   2. Which pharmacy/location (including street and city if local pharmacy) is medication to be sent to?  [facility] - [place], [place] - [address]    3. Do they need a 30 day or 90 day supply? 90

## 2023-06-22 ENCOUNTER — Other Ambulatory Visit: Payer: Self-pay | Admitting: Family

## 2023-06-22 ENCOUNTER — Encounter: Payer: Self-pay | Admitting: Internal Medicine

## 2023-06-22 ENCOUNTER — Encounter: Payer: Self-pay | Admitting: Family

## 2023-06-22 MED ORDER — COLCHICINE 0.6 MG PO CAPS: 0.6000 mg | ORAL_CAPSULE | Freq: Every day | ORAL | 0 refills | Status: DC

## 2023-06-26 ENCOUNTER — Other Ambulatory Visit: Payer: Self-pay | Admitting: Internal Medicine

## 2023-06-26 DIAGNOSIS — I5032 Chronic diastolic (congestive) heart failure: Secondary | ICD-10-CM

## 2023-07-02 ENCOUNTER — Encounter: Payer: Self-pay | Admitting: Family

## 2023-07-03 NOTE — Telephone Encounter (Signed)
Called pts daughter was not able to leave a VM due to mailbox being full.

## 2023-07-04 NOTE — Telephone Encounter (Signed)
 Spoke with pts daughter, advised Delon pt should come in for an appointment. Daughter states pt was able to get his medication and has not mentioned being in any pain. Pts daughter stated she will call back at a later time to schedule an appointment after speaking with her dad.

## 2023-09-08 ENCOUNTER — Other Ambulatory Visit: Payer: Self-pay | Admitting: Family

## 2023-09-08 MED ORDER — LISINOPRIL 5 MG PO TABS
5.0000 mg | ORAL_TABLET | Freq: Every day | ORAL | 0 refills | Status: DC
Start: 2023-09-08 — End: 2023-12-29

## 2023-09-18 ENCOUNTER — Other Ambulatory Visit: Payer: Self-pay | Admitting: Family

## 2023-09-21 ENCOUNTER — Encounter: Payer: Self-pay | Admitting: Neurology

## 2023-09-21 ENCOUNTER — Ambulatory Visit: Payer: Medicare HMO | Admitting: Neurology

## 2023-09-21 VITALS — BP 131/87 | HR 56 | Ht 70.0 in | Wt 266.0 lb

## 2023-09-21 DIAGNOSIS — Z8673 Personal history of transient ischemic attack (TIA), and cerebral infarction without residual deficits: Secondary | ICD-10-CM

## 2023-09-21 DIAGNOSIS — I482 Chronic atrial fibrillation, unspecified: Secondary | ICD-10-CM

## 2023-09-21 NOTE — Progress Notes (Signed)
 [facility] [address] [place]. [place] [address] [phone]       OFFICE FOLLOW-UP NOTE  [name] Date of Birth:  [birth year] Medical Record Number:  [id number]   HPI: Initial visit 09/08/2022 Steven Barnett is a 73 year old African-American male seen today for initial office follow-up visit following hospital admission for intracerebral hemorrhage in April 2023.  Steven Barnett is accompanied by her daughter.  History is obtained from them and review of electronic medical records.  I have personally reviewed pertinent available imaging films in PACS.  Steven Barnett has past medical history of hypertension, stage I colon cancer, atrial fibrillation on long term anticoagulation with warfarin, obstructive sleep apnea, Trelegy and obesity.  Steven Barnett presented on 10/09/2021 with sudden onset of of altered mental status Steven Barnett was found to have some shaking of the right upper extremity and.  Blood pressure on arrival was 160/104.  Patient was seen by teleneurologist who apparently a partial complex seizure involving right body and right gaze deviation CT head showed left atrial parieto-occipital 1.8 x 1.2 x 1 parenchymal hematoma with mild surrounding edema.  Score was 1 patient was on warfarin and INR was optimal at 2.0.  This was reversed with Kcentra.  CT angiogram of the brain and neck showed no evidence of aneurysm, AVM or occlusion.  Steven Barnett was admitted to the neuro ICU and blood pressure was tightly controlled.  MRI scan confirmed parenchymal right occipital periatrial hematoma without underlying Steven Barnett was negative for seizures.  2D echo showed ejection fraction of 60-65% without cardiac source of embolism.  LDL cholesterol 62 mg percent.  Hemoglobin A1c was warfarin was held during her hospitalization and was started after about a month later Steven Barnett was switched to Eliquis.  Hospitalization was complicated by flareup of his gout for which Steven Barnett was started on colchicine and.  Prednisone.  Steven Barnett was discharged for rehabilitation  to skilled nursing facility.  Patient states is done well.  Steven Barnett missed several follow-up appointments in our office since then.  Steven Barnett is currently living at home with his daughter.  Steven Barnett is doing well.  Steven Barnett is independent in all activities of daily living.Marland Kitchen  His main complaint today is likely having a cough which may be due to flareup of his allergies.Marland Kitchen  Steven Barnett plans to see his primary care physician for the same next week.  Steven Barnett states Steven Barnett is taking all his medications and his blood pressure is well-controlled.  Today it is 123/79. Update 09/21/2023 : Steven Barnett returns for follow-up after last visit with me a year ago.  Steven Barnett tapered and discontinue the Keppra and has done well and has not had any seizures.  Steven Barnett remains on Eliquis which is tolerating well with minor bruising and no bleeding.  Steven Barnett had EEG done on 09/20/2022 which was normal.  Steven Barnett states his blood pressure under good control.  Steven Barnett states his sugars also been fine.  Last lipid panel on 09/19/2022 showed LDL-cholesterol to be 63 mg percent.  Steven Barnett has no new complaints ROS:   14 system review of systems is positive for cough, allergies and all other systems negative  PMH:  Past Medical History:  Diagnosis Date   Anemia    Arthralgia of both knees    Arthritis    "right knee" (10/01/2015)   Atrial fibrillation (HCC)    Cancer (HCC)    STAGE 1 COLON CANCER   Chronic anticoagulation 2010   Coumadin   Chronic diastolic CHF (congestive heart failure), NYHA class 2 (HCC)  Dyspnea    Dysrhythmia    Hypertension    OSA (obstructive sleep apnea) 2016   "couldn't take the mask during the testing" (10/01/2015)   Permanent atrial fibrillation (HCC) 2010   Pneumonia 3/29/2017and june 2017    Social History:  Social History   Socioeconomic History   Marital status: Married    Spouse name: Biomedical engineer   Number of children: Not on file   Years of education: Not on file   Highest education level: Not on file  Occupational History   Not on file  Tobacco Use   Smoking  status: Former    Current packs/day: 0.00    Average packs/day: 0.1 packs/day for 2.0 years (0.2 ttl pk-yrs)    Types: Cigarettes    Start date: 12/15/1972    Quit date: 12/16/1974    Years since quitting: 48.7   Smokeless tobacco: Never   Tobacco comments:    "1 pack cigarettes would last me a month"  Vaping Use   Vaping status: Never Used  Substance and Sexual Activity   Alcohol use: No   Drug use: No    Types: Cocaine, Marijuana    Comment: Hx polysubstance abuse, quit 2008   Sexual activity: Not Currently  Other Topics Concern   Not on file  Social History Narrative   Married, wife Seward Meth (since 67)   Social Drivers of Corporate investment banker Strain: Not on [employer] Insecurity: Not on file  Transportation Needs: Not on file  Physical Activity: Not on file  Stress: Not on file  Social Connections: Not on file  Intimate Partner Violence: Not on file    Medications:   Current Outpatient Medications on File Prior to Visit  Medication Sig Dispense Refill   acetaminophen (TYLENOL) 500 MG tablet Take 1 tablet (500 mg total) by mouth every 6 (six) hours as needed. Take 1 tablet 500 mg 3 times daily x5 days, then every 6 hours as needed.  0   albuterol (VENTOLIN HFA) 108 (90 Base) MCG/ACT inhaler Inhale 2 puffs into the lungs every 6 (six) hours as needed for wheezing or shortness of breath. 1 each 2   allopurinol (ZYLOPRIM) 100 MG tablet Take 1 tablet by mouth once daily 90 tablet 0   apixaban (ELIQUIS) 5 MG TABS tablet TAKE 1 TABLET BY MOUTH IN THE MORNING AND AT BEDTIME 180 tablet 1   aspirin EC 81 MG tablet Take 81 mg by mouth every morning.     benzonatate (TESSALON) 100 MG capsule Take 1 capsule (100 mg total) by mouth 3 (three) times daily as needed. 20 capsule 0   diclofenac Sodium (VOLTAREN) 1 % GEL Apply 2 g topically 4 (four) times daily as needed (for joint pain). 100 g 0   diltiazem (CARDIZEM) 120 MG tablet TAKE 1 TABLET BY MOUTH THREE TIMES DAILY 270 tablet  0   doxycycline (VIBRA-TABS) 100 MG tablet Take 1 tablet (100 mg total) by mouth 2 (two) times daily. 14 tablet 0   empagliflozin (JARDIANCE) 10 MG TABS tablet Take 1 tablet (10 mg total) by mouth daily before breakfast. 90 tablet 3   furosemide (LASIX) 40 MG tablet TAKE 1 TABLET EVERY DAY 90 tablet 2   levETIRAcetam (KEPPRA) 250 MG tablet Take 1 tablet (250 mg total) by mouth 2 (two) times daily. Recommend Keppra  250 mg twice daily for a month followed by to 50 mg once daily and then discontinue 30 tablet 1   lisinopril (ZESTRIL) 5 MG  tablet Take 1 tablet (5 mg total) by mouth daily. 90 tablet 0   MAGNESIUM-OXIDE 400 (240 Mg) MG tablet Take 1 tablet by mouth 2 (two) times daily.     MITIGARE 0.6 MG CAPS Take 1 capsule by mouth once daily 30 capsule 0   pantoprazole (PROTONIX) 40 MG tablet Take 1 tablet by mouth once daily 90 tablet 0   polyethylene glycol powder (GLYCOLAX/MIRALAX) 17 GM/SCOOP powder Measure  1 capful (17 g) & mix with 4 oz of water and drink by mouth daily. 238 g 1   potassium chloride (KLOR-CON) 10 MEQ tablet Take 1 tablet (10 mEq total) by mouth daily. 90 tablet 3   senna-docusate (SENOKOT-S) 8.6-50 MG tablet Take 1 tablet by mouth 2 (two) times daily.     No current facility-administered medications on file prior to visit.    Allergies:  No Known Allergies  Physical Exam General: Obese pleasant elderly African-American male seated, in no evident distress Head: head normocephalic and atraumatic.  Neck: supple with no carotid or supraclavicular bruits Cardiovascular: regular rate and rhythm, no murmurs Musculoskeletal: no deformity Skin:  no rash/petichiae Vascular:  Normal pulses all extremities Vitals:   09/21/23 1254  BP: 131/87  Pulse: (!) 56   Neurologic Exam Mental Status: Awake and fully alert. Oriented to place and time. Recent and remote memory intact. Attention span, concentration and fund of knowledge appropriate. Mood and affect appropriate.  Cranial  Nerves: Fundoscopic exam not done pupils equal, briskly reactive to light. Extraocular movements full without nystagmus. Visual fields full to confrontation. Hearing intact. Facial sensation intact. Face, tongue, palate moves normally and symmetrically.  Motor: Normal bulk and tone. Normal strength in all tested extremity muscles. Sensory.: intact to touch ,pinprick .position and vibratory sensation.  Coordination: Rapid alternating movements normal in all extremities. Finger-to-nose and heel-to-shin performed accurately bilaterally. Gait and Station: Arises from chair without difficulty. Stance is normal. Gait demonstrates normal stride length and balance . Able to heel, toe and tandem walk with moderate difficulty.  Reflexes: 1+ and symmetric. Toes downgoing.       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  Steven Barnett has since been switched to Eliquis and is doing very well.        PLAN: I had a long discussion with the patient about his remote intracerebral hemorrhage and atrial fibrillation need for anticoagulation and answered questions.  Continue Eliquis and the current dose of 5 mg twice daily   Continue strict control of hypertension with blood pressure goal below 140/90.     Steven Barnett will return for follow-up in the future only as needed or call earlier if necessary Greater than 50% of time during this 35 minute visit was spent on counseling,explanation of diagnosis of hemorrhagic infarct and A-fib and risk benefit of anticoagulation, planning of further management, discussion with patient and family and coordination of care Delia Heady, MD Note: This document was prepared with digital dictation and possible smart phrase technology. Any transcriptional errors that result from this process are unintentional

## 2023-09-21 NOTE — Patient Instructions (Signed)
:  I had a long discussion with the patient about his remote intracerebral hemorrhage and atrial fibrillation need for anticoagulation and answered questions.  Continue Eliquis and the current dose of 5 mg twice daily   Continue strict control of hypertension with blood pressure goal below 140/90.  He was also advised to be compliant with using his CPAP for his sleep apnea.  He will return for follow-up in the future only as needed or call earlier if necessary

## 2023-10-10 ENCOUNTER — Telehealth: Payer: Self-pay | Admitting: Family

## 2023-10-10 NOTE — Telephone Encounter (Signed)
 Please send him a letter that he is due for OV;

## 2023-10-11 NOTE — Telephone Encounter (Signed)
 Letter has been printed and placed up front to mail out to pt.

## 2023-10-19 ENCOUNTER — Other Ambulatory Visit: Payer: Self-pay | Admitting: Family

## 2023-10-21 ENCOUNTER — Other Ambulatory Visit: Payer: Self-pay | Admitting: Family

## 2023-10-23 ENCOUNTER — Other Ambulatory Visit: Payer: Self-pay | Admitting: Family

## 2023-10-24 ENCOUNTER — Other Ambulatory Visit: Payer: Self-pay | Admitting: Family

## 2023-10-24 ENCOUNTER — Telehealth: Payer: Self-pay

## 2023-10-24 MED ORDER — COLCHICINE 0.6 MG PO TABS: 0.6000 mg | ORAL_TABLET | Freq: Every day | ORAL | 0 refills | Status: DC

## 2023-10-24 NOTE — Telephone Encounter (Signed)
 Copied from CRM [id number]. Topic: Clinical - Prescription Issue >> Oct 24, 2023 11:15 AM Martinique E wrote: Reason for CRM: Bynum Cassis from the [facility] pharmacy called stating that the patient's insurance will not cover his MITIGARE  0.6 MG CAPS, but will cover Colchicine  tablets. Bynum Cassis questioning if PCP can re-send order with new medication.

## 2023-11-15 ENCOUNTER — Other Ambulatory Visit: Payer: Self-pay | Admitting: Internal Medicine

## 2023-11-15 DIAGNOSIS — I4891 Unspecified atrial fibrillation: Secondary | ICD-10-CM

## 2023-11-15 NOTE — Telephone Encounter (Signed)
 Prescription refill request for Eliquis  received. Indication:afib Last office visit:10/24 Scr:1.09  5/24 Age: 73 Weight:120.7  kg  Prescription refilled

## 2023-11-22 ENCOUNTER — Telehealth: Payer: Self-pay | Admitting: Family

## 2023-11-22 NOTE — Telephone Encounter (Signed)
 Copied from CRM [id number]. Topic: Medicare AWV >> Nov 22, 2023 10:14 AM Juliana Ocean wrote: Reason for CRM: LVM 11/22/2023 to schedule AWV. Please schedule Virtual or Telehealth visits ONLY  Rosalee Collins; Care Guide Ambulatory Clinical Support [place] [facility] Direct Dial: [phone]

## 2023-11-24 ENCOUNTER — Other Ambulatory Visit (HOSPITAL_BASED_OUTPATIENT_CLINIC_OR_DEPARTMENT_OTHER): Payer: Self-pay | Admitting: Family

## 2023-11-28 ENCOUNTER — Telehealth: Payer: Self-pay | Admitting: Family

## 2023-11-28 ENCOUNTER — Other Ambulatory Visit: Payer: Self-pay | Admitting: Family

## 2023-11-28 NOTE — Telephone Encounter (Signed)
 Left detailed message on daughters machine to call back to schedule appointment.

## 2023-11-28 NOTE — Telephone Encounter (Signed)
 Please mail him a letter letting him know that OV is required for further refills. We put a note on last refill but he didn't respond.

## 2023-12-05 ENCOUNTER — Ambulatory Visit (INDEPENDENT_AMBULATORY_CARE_PROVIDER_SITE_OTHER): Admitting: Family

## 2023-12-05 ENCOUNTER — Encounter: Payer: Self-pay | Admitting: Family

## 2023-12-05 VITALS — BP 136/82 | HR 66 | Ht 70.0 in | Wt 268.2 lb

## 2023-12-05 DIAGNOSIS — G40909 Epilepsy, unspecified, not intractable, without status epilepticus: Secondary | ICD-10-CM

## 2023-12-05 DIAGNOSIS — E876 Hypokalemia: Secondary | ICD-10-CM

## 2023-12-05 DIAGNOSIS — I5033 Acute on chronic diastolic (congestive) heart failure: Secondary | ICD-10-CM | POA: Diagnosis not present

## 2023-12-05 DIAGNOSIS — R7309 Other abnormal glucose: Secondary | ICD-10-CM | POA: Diagnosis not present

## 2023-12-05 DIAGNOSIS — Z13220 Encounter for screening for lipoid disorders: Secondary | ICD-10-CM

## 2023-12-05 DIAGNOSIS — Z125 Encounter for screening for malignant neoplasm of prostate: Secondary | ICD-10-CM | POA: Diagnosis not present

## 2023-12-05 DIAGNOSIS — I10 Essential (primary) hypertension: Secondary | ICD-10-CM | POA: Diagnosis not present

## 2023-12-05 DIAGNOSIS — M1A9XX0 Chronic gout, unspecified, without tophus (tophi): Secondary | ICD-10-CM

## 2023-12-05 DIAGNOSIS — J42 Unspecified chronic bronchitis: Secondary | ICD-10-CM | POA: Insufficient documentation

## 2023-12-05 LAB — CBC WITH DIFFERENTIAL/PLATELET
Basophils Absolute: 0 10*3/uL (ref 0.0–0.1)
Basophils Relative: 1.1 % (ref 0.0–3.0)
Eosinophils Absolute: 0.2 10*3/uL (ref 0.0–0.7)
Eosinophils Relative: 5.3 % — ABNORMAL HIGH (ref 0.0–5.0)
HCT: 52 % (ref 39.0–52.0)
Hemoglobin: 16.6 g/dL (ref 13.0–17.0)
Lymphocytes Relative: 28.2 % (ref 12.0–46.0)
Lymphs Abs: 1.3 10*3/uL (ref 0.7–4.0)
MCHC: 31.9 g/dL (ref 30.0–36.0)
MCV: 85.4 fl (ref 78.0–100.0)
Monocytes Absolute: 0.5 10*3/uL (ref 0.1–1.0)
Monocytes Relative: 10.8 % (ref 3.0–12.0)
Neutro Abs: 2.5 10*3/uL (ref 1.4–7.7)
Neutrophils Relative %: 54.6 % (ref 43.0–77.0)
Platelets: 118 10*3/uL — ABNORMAL LOW (ref 150.0–400.0)
RBC: 6.09 Mil/uL — ABNORMAL HIGH (ref 4.22–5.81)
RDW: 16.1 % — ABNORMAL HIGH (ref 11.5–15.5)
WBC: 4.5 10*3/uL (ref 4.0–10.5)

## 2023-12-05 LAB — LIPID PANEL
Cholesterol: 123 mg/dL (ref 0–200)
HDL: 54.6 mg/dL (ref >39.00)
LDL Cholesterol: 49 mg/dL (ref 0–99)
NonHDL: 68.58
Total CHOL/HDL Ratio: 2
Triglycerides: 99 mg/dL (ref 0.0–149.0)
VLDL: 19.8 mg/dL (ref 0.0–40.0)

## 2023-12-05 LAB — COMPREHENSIVE METABOLIC PANEL WITH GFR
ALT: 15 U/L (ref 0–53)
AST: 17 U/L (ref 0–37)
Albumin: 4 g/dL (ref 3.5–5.2)
Alkaline Phosphatase: 125 U/L — ABNORMAL HIGH (ref 39–117)
BUN: 16 mg/dL (ref 6–23)
CO2: 30 meq/L (ref 19–32)
Calcium: 8.9 mg/dL (ref 8.4–10.5)
Chloride: 103 meq/L (ref 96–112)
Creatinine, Ser: 1.11 mg/dL (ref 0.40–1.50)
GFR: 66.55 mL/min (ref >60.00)
Glucose, Bld: 75 mg/dL (ref 70–99)
Potassium: 3.9 meq/L (ref 3.5–5.1)
Sodium: 139 meq/L (ref 135–145)
Total Bilirubin: 1.4 mg/dL — ABNORMAL HIGH (ref 0.2–1.2)
Total Protein: 7 g/dL (ref 6.0–8.3)

## 2023-12-05 LAB — PSA: PSA: 35.18 ng/mL — ABNORMAL HIGH (ref 0.10–4.00)

## 2023-12-05 LAB — HEMOGLOBIN A1C: Hgb A1c MFr Bld: 5.4 % (ref 4.6–6.5)

## 2023-12-05 LAB — URIC ACID: Uric Acid, Serum: 5.6 mg/dL (ref 4.0–7.8)

## 2023-12-05 MED ORDER — COLCHICINE 0.6 MG PO TABS: 0.6000 mg | ORAL_TABLET | Freq: Every day | ORAL | 3 refills | Status: AC

## 2023-12-05 MED ORDER — DILTIAZEM HCL 120 MG PO TABS: 120.0000 mg | ORAL_TABLET | Freq: Three times a day (TID) | ORAL | 3 refills | Status: AC

## 2023-12-05 MED ORDER — POTASSIUM CHLORIDE ER 10 MEQ PO TBCR: 10.0000 meq | EXTENDED_RELEASE_TABLET | Freq: Every day | ORAL | 3 refills | Status: AC

## 2023-12-05 NOTE — Progress Notes (Signed)
 Steven Barnett is a 73 y.o. male with the following history as recorded in EpicCare:  Patient Active Problem List   Diagnosis Date Noted   Chronic bronchitis, unspecified chronic bronchitis type (HCC) 12/05/2023   Constipation 10/16/2021   Atrial fibrillation with rapid ventricular response (HCC) 10/13/2021   Hyponatremia 10/13/2021   Gout 10/13/2021   Hypertensive emergency    Seizure (HCC)    ICH (intracerebral hemorrhage) (HCC) 10/09/2021   Hypovolemia 01/14/2021   Hypokalemia 01/14/2021   Hypomagnesemia 01/14/2021   Normocytic anemia 01/14/2021   Gait instability 01/14/2021   Hypotension 08/17/2016   SIRS (systemic inflammatory response syndrome) (HCC) 08/17/2016   Influenza A 08/17/2016   Septic shock (HCC) 08/10/2016   Neutropenia, drug-induced (HCC)    Gastroenteritis due to norovirus    Acute kidney injury (HCC)    Genetic testing 07/01/2016   Port catheter in place 06/29/2016   Family history of colon cancer 06/08/2016   Family history of colonic polyps 06/08/2016   Cancer of right colon (HCC) 04/12/2016   Pre-operative cardiovascular examination 04/06/2016   Acute on chronic diastolic heart failure (HCC) 02/02/2016   Acute respiratory failure with hypoxia (HCC) 02/02/2016   Anemia, iron deficiency 02/02/2016   Current use of long term anticoagulation 12/03/2015   Pneumonia 09/30/2015   PNA (pneumonia) 09/30/2015   Acute on chronic congestive heart failure (HCC)    CHF exacerbation (HCC) 07/08/2015   Community acquired pneumonia 02/10/2015   ED (erectile dysfunction) 08/12/2014   Arthralgia of both knees 04/08/2014   Chronic diastolic (congestive) heart failure (HCC) 01/14/2009   Morbid obesity (HCC) 12/31/2008   Obstructive sleep apnea 12/31/2008   Essential hypertension, benign 12/31/2008   Atrial fibrillation (HCC) 12/31/2008    Current Outpatient Medications  Medication Sig Dispense Refill   acetaminophen  (TYLENOL ) 500 MG tablet Take 1 tablet (500 mg  total) by mouth every 6 (six) hours as needed. Take 1 tablet 500 mg 3 times daily x5 days, then every 6 hours as needed.  0   albuterol  (VENTOLIN  HFA) 108 (90 Base) MCG/ACT inhaler Inhale 2 puffs into the lungs every 6 (six) hours as needed for wheezing or shortness of breath. 1 each 2   allopurinol  (ZYLOPRIM ) 100 MG tablet Take 1 tablet by mouth once daily 30 tablet 0   apixaban  (ELIQUIS ) 5 MG TABS tablet TAKE 1 TABLET BY MOUTH IN THE MORNING AND AT BEDTIME 180 tablet 1   aspirin  EC 81 MG tablet Take 81 mg by mouth every morning.     diclofenac  Sodium (VOLTAREN ) 1 % GEL Apply 2 g topically 4 (four) times daily as needed (for joint pain). 100 g 0   empagliflozin  (JARDIANCE ) 10 MG TABS tablet TAKE 1 TABLET BY MOUTH ONCE DAILY BEFORE BREAKFAST 90 tablet 1   furosemide  (LASIX ) 40 MG tablet TAKE 1 TABLET EVERY DAY 90 tablet 2   levETIRAcetam  (KEPPRA ) 250 MG tablet Take 1 tablet (250 mg total) by mouth 2 (two) times daily. Recommend Keppra   250 mg twice daily for a month followed by to 50 mg once daily and then discontinue 30 tablet 1   lisinopril  (ZESTRIL ) 5 MG tablet Take 1 tablet (5 mg total) by mouth daily. 90 tablet 0   MAGNESIUM -OXIDE 400 (240 Mg) MG tablet Take 1 tablet by mouth 2 (two) times daily.     pantoprazole  (PROTONIX ) 40 MG tablet Take 1 tablet by mouth once daily 90 tablet 0   polyethylene glycol powder (GLYCOLAX /MIRALAX ) 17 GM/SCOOP powder Measure  1 capful (  17 g) & mix with 4 oz of water and drink by mouth daily. 238 g 1   senna-docusate (SENOKOT-S) 8.6-50 MG tablet Take 1 tablet by mouth 2 (two) times daily.     colchicine  0.6 MG tablet Take 1 tablet (0.6 mg total) by mouth daily. 90 tablet 3   diltiazem  (CARDIZEM ) 120 MG tablet Take 1 tablet (120 mg total) by mouth 3 (three) times daily. 270 tablet 3   potassium chloride  (KLOR-CON ) 10 MEQ tablet Take 1 tablet (10 mEq total) by mouth daily. 90 tablet 3   No current facility-administered medications for this visit.    Allergies:  Patient has no known allergies.  Past Medical History:  Diagnosis Date   Anemia    Arthralgia of both knees    Arthritis    "right knee" (10/01/2015)   Atrial fibrillation (HCC)    Cancer (HCC)    STAGE 1 COLON CANCER   Chronic anticoagulation 2010   Coumadin    Chronic diastolic CHF (congestive heart failure), NYHA class 2 (HCC)    Dyspnea    Dysrhythmia    Hypertension    OSA (obstructive sleep apnea) 2016   "couldn't take the mask during the testing" (10/01/2015)   Permanent atrial fibrillation (HCC) 2010   Pneumonia 3/29/2017and june 2017    Past Surgical History:  Procedure Laterality Date   COLONOSCOPY WITH PROPOFOL  N/A 03/04/2016   Procedure: COLONOSCOPY WITH PROPOFOL ;  Surgeon: Alvis Jourdain, MD;  Location: [facility];  Service: Endoscopy;  Laterality: N/A;   COLONOSCOPY WITH PROPOFOL  N/A 09/15/2017   Procedure: COLONOSCOPY WITH PROPOFOL ;  Surgeon: Alvis Jourdain, MD;  Location: [facility];  Service: Endoscopy;  Laterality: N/A;   COLONOSCOPY WITH PROPOFOL  N/A 02/12/2021   Procedure: COLONOSCOPY WITH PROPOFOL ;  Surgeon: Alvis Jourdain, MD;  Location: [facility];  Service: Endoscopy;  Laterality: N/A;   LAPAROSCOPIC PARTIAL COLECTOMY N/A 04/12/2016   Procedure: LAPAROSCOPIC ILEOCOLECTOMY;  Surgeon: Lockie Rima, MD;  Location: [facility] OR;  Service: General;  Laterality: N/A;   PORT-A-CATH REMOVAL N/A 02/16/2017   Procedure: REMOVAL PORT-A-CATH;  Surgeon: Lockie Rima, MD;  Location: [facility] OR;  Service: General;  Laterality: N/A;   PORTACATH PLACEMENT N/A 05/18/2016   Procedure: INSERTION PORT-A-CATH;  Surgeon: Lockie Rima, MD;  Location: [facility];  Service: General;  Laterality: N/A;   THORACOTOMY Right 2010    Family History  Problem Relation Age of Onset   Heart disease Father    Heart failure Father    Colon polyps Father        unspecified number - pt of intestine surgically resected   Hypertension Mother    Diabetes Mother    Hyperlipidemia Mother     Colon polyps Mother        unspecified number - pt of intestine surgically resected   Dementia Mother        d. 40   Stroke Maternal Grandmother    Stroke Sister    Colon cancer Sister 2       w/ "cancerous polyps" - unspecified number; underwent surgery and radiation   Colon polyps Brother        unspecified number - polypectomies   Colon cancer Sister        dx 42-60; s/p surgery and radiation   Bone cancer Maternal Uncle        dx. 60s   Lung cancer Paternal Grandfather        d. 80s-90s; lung cancer vs TB   Colon cancer Sister  dx. 50-60; s/p surgery and radiation   Cancer Maternal Uncle        mother had about 7-8 other siblings, most passed at older ages, many of whom had some type of cancer   Heart attack Neg Hx     Social History   Tobacco Use   Smoking status: Former    Current packs/day: 0.00    Average packs/day: 0.1 packs/day for 2.0 years (0.2 ttl pk-yrs)    Types: Cigarettes    Start date: 12/15/1972    Quit date: 12/16/1974    Years since quitting: 49.0   Smokeless tobacco: Never   Tobacco comments:    "1 pack cigarettes would last me a month"  Substance Use Topics   Alcohol use: No    Subjective:   1 year follow up on chronic care needs; no acute concerns today; due to see his cardiologist; agreeable to having labs updated;  Denies any chest pain, shortness of breath, blurred vision or headache; no gout flares;     Objective:  Vitals:   12/05/23 1006  BP: 136/82  Pulse: 66  SpO2: 97%  Weight: 268 lb 3.2 oz (121.7 kg)  Height: 5\' 10"  (1.778 m)    General: Well developed, well nourished, in no acute distress  Skin : Warm and dry.  Head: Normocephalic and atraumatic  Eyes: Sclera and conjunctiva clear; pupils round and reactive to light; extraocular movements intact  Ears: External normal; canals clear; tympanic membranes normal  Oropharynx: Pink, supple. No suspicious lesions  Neck: Supple without thyromegaly, adenopathy  Lungs:  Respirations unlabored; clear to auscultation bilaterally without wheeze, rales, rhonchi  CVS exam: normal rate and regular rhythm.  Abdomen: Soft; nontender; nondistended; normoactive bowel sounds; no masses or hepatosplenomegaly  Musculoskeletal: No deformities; no active joint inflammation  Extremities: No edema, cyanosis, clubbing  Vessels: Symmetric bilaterally  Neurologic: Alert and oriented; speech intact; face symmetrical; moves all extremities well; CNII-XII intact without focal deficit   Assessment:  1. Essential hypertension, benign   2. Hypokalemia   3. Acute on chronic diastolic CHF (congestive heart failure) (HCC) Chronic  4. Seizure disorder (HCC) Chronic  5. Morbid obesity (HCC) Chronic  6. Lipid screening   7. Elevated glucose   8. Prostate cancer screening   9. Chronic gout without tophus, unspecified cause, unspecified site     Plan:  Age appropriate preventive healthcare needs addressed; encouraged regular eye doctor and dental exams; encouraged regular exercise; will update labs and refills today; he is encouraged to follow up with cardiologist; follow up in 1 year, sooner prn.    No follow-ups on file.  Orders Placed This Encounter  Procedures   CBC with Differential/Platelet   Comp Met (CMET)   Lipid panel   Hemoglobin A1c   PSA   Uric acid    Requested Prescriptions   Signed Prescriptions Disp Refills   potassium chloride  (KLOR-CON ) 10 MEQ tablet 90 tablet 3    Sig: Take 1 tablet (10 mEq total) by mouth daily.   colchicine  0.6 MG tablet 90 tablet 3    Sig: Take 1 tablet (0.6 mg total) by mouth daily.   diltiazem  (CARDIZEM ) 120 MG tablet 270 tablet 3    Sig: Take 1 tablet (120 mg total) by mouth 3 (three) times daily.

## 2023-12-05 NOTE — Patient Instructions (Addendum)
 Just a reminder- you are due for a follow up with Dr. Maximo Spar; please call and schedule that appointment. [phone];

## 2023-12-06 ENCOUNTER — Ambulatory Visit: Payer: Self-pay | Admitting: Family

## 2023-12-06 ENCOUNTER — Other Ambulatory Visit: Payer: Self-pay | Admitting: Family

## 2023-12-06 DIAGNOSIS — R972 Elevated prostate specific antigen [PSA]: Secondary | ICD-10-CM

## 2023-12-07 ENCOUNTER — Encounter (HOSPITAL_BASED_OUTPATIENT_CLINIC_OR_DEPARTMENT_OTHER): Payer: Self-pay | Admitting: *Deleted

## 2023-12-07 NOTE — Telephone Encounter (Signed)
 This encounter was created in error - please disregard.

## 2023-12-08 ENCOUNTER — Other Ambulatory Visit: Payer: Self-pay

## 2023-12-08 DIAGNOSIS — C182 Malignant neoplasm of ascending colon: Secondary | ICD-10-CM

## 2023-12-09 NOTE — Progress Notes (Unsigned)
 [facility]   Telephone:([phone]) [phone] Fax:([phone]) [phone]    Patient Care Team: Adra Alanis, FNP as PCP - General (Internal Medicine) Maximo Spar Aviva Lemmings, MD as PCP - Cardiology (Cardiology) Lockie Rima, MD as Consulting Physician (General Surgery) Maximo Spar Aviva Lemmings, MD as Consulting Physician (Cardiology)   CHIEF COMPLAINT: Follow up abnormal labs, h/o colon cancer   Oncology History Overview Note  Cancer of right colon [facility])   Staging form: Colon and Rectum, AJCC 7th Edition   - Clinical stage from 04/12/2016: Stage IIIA (T2, N1, M0) - Signed by Sonja Beckett, MD on 05/01/2016    Cancer of right colon (HCC)  03/04/2016 Procedure   COLONOSCOPY: A polypoid and ulcerated non-obstructing large mass was found in the cecum. The mass was noncircumferential. The mass measured three cm in length. In addition, its diameter measured three mm. (Dr. Nickey Barn)    03/04/2016 Initial Biopsy   Diagnosis 1. Colon, biopsy, cecal - ADENOCARCINOMA. 2. Colon, polyp(s), transverse - BENIGN COLONIC MUCOSA WITH BENIGN LYMPHOID AGGREGATE. - NO ADENOMATOUS CHANGE OR MALIGNANCY IDENTIFIED. Microscopic Comment 1. The biopsies are involved by moderately to poorly differentiated colorectal type adenocarcinoma.   03/04/2016 Initial Diagnosis   Cecal cancer (HCC)   03/15/2016 Imaging   1. Polypoid cecal mass. No findings of liver metastatic disease or other definite metastatic disease. There are some small adjacent pericecal lymph nodes which are not pathologically enlarged. 2. Adenopathy in the chest is present but was also present in 2010, albeit slightly less prominent. This may be reactive adenopathy related to the chronic exudative right pleural effusion.    04/07/2016 Tumor Marker   Patient's tumor was tested for the following markers: CEA. Results of the tumor marker test revealed 2.7.   04/12/2016 Definitive Surgery   Laparoscopic right hemicolectomy (Dr. Cherlynn Cornfield)    04/12/2016 Pathologic Stage   pT2 pN1 pMX--Grade 3 adenocarcinoma with 2/16 nodes positive, clear margins, negative for perineural or lymphvascular invasion; invades muscularis propria Loss of expression MLH1/PMS2   06/02/2016 - 09/13/2016 Adjuvant Chemotherapy    Ajuvant chemotherapy FOLFOX, every 2 weeks for 6 cycles (3 months), last cycle postponed due to hospitalization    06/29/2016 Genetic Testing   POLE c.4523G>A VUS identified on the Colorectal cancer panel.  Negative genetic testing for the MSH2 inversion analysis ([place] inversion). The Colorectal Cancer Panel offered by [facility] includes sequencing and/or duplication/deletion testing of the following 19 genes: APC, ATM, AXIN2, BMPR1A, CDH1, CHEK2, EPCAM, MLH1, MSH2, MSH6, MUTYH, PMS2, POLD1, POLE, PTEN, SCG5/GREM1, SMAD4, STK11, and TP53. The report date is 06/22/2016 for the [facility] panel and 06/29/2016 for the [place] inversion.  POLE c.4523G>A VUS has been reclassified to a likely benign variant based on a combination of sources, e.g, internal data, published literature, population databases and in silico models. The reclassification date is June 09, 2017.    08/17/2016 - 08/20/2016 Hospital Admission   Admit date: 08/17/2016 Admission diagnosis: Acute respiratory failure with hypoxia  Additional comments: Patient was admitted initially with hypoxia and found to have H influenza positive-started on Tamiflu  and broad-spectrum antibiotics were D escalate. He was hypotensive on admission given IV saline 75 cc per hour saline lock 2/16 and antihypertensives which were held including lisinopril  and Lasix  and hydralazine  on discharge. Joint hospitalization he continued on his metoprolol  twice a day   01/20/2017 Imaging   CT CAP W Contrast 01/20/17 IMPRESSION: 1. No evidence of local tumor recurrence at the ileocolic anastomosis . 2. No findings suspicious for  metastatic disease in the chest, abdomen or pelvis. 3. Mild mediastinal  lymphadenopathy is stable since 2010 and considered benign . 4. Stable morphologic changes suggestive of cirrhosis. 5. Stable patulous fluid-filled thoracic esophagus. Patchy ground-glass opacity in the posterior right upper lobe is probably inflammatory, possibly due to aspiration. 6. Stable chronic small loculated dependent right pleural effusion.    09/15/2017 Pathology Results   09/15/2017 Surgical Pathology Diagnosis Colon, polyp(s), sigmoid - TUBULAR ADENOMA. - NO HIGH GRADE DYSPLASIA OR MALIGNANCY.   01/15/2018 Imaging   01/15/2018 CT CAP W Contrast IMPRESSION: 1. No findings identified to suggest local tumor recurrence at the ileocolic anastomosis. No evidence for distant metastatic disease. 2. Stable mild mediastinal lymphadenopathy since 2010 and considered benign. 3. Similar appearance of patulous fluid-filled thoracic esophagus. 4. Unchanged right posterior pleural thickening with loculated pleural fluid. 5.  Aortic Atherosclerosis (ICD10-I70.0).   01/15/2018 Tumor Marker   CEA: 04/07/16: 2.7 04/27/2016: 2.11 07/14/2016: 4.25 10/21/2016: 3.60 01/16/17: 3.76 04/25/2017: 5.16 08/28/2017: 2.06 01/15/2018: 2.90    01/28/2019 Imaging   CT CAP W Contrast  IMPRESSION: Stable exam. No evidence of recurrent or metastatic carcinoma. No acute findings.      INTERVAL HISTORY Mr. Esteban presents to re-establish care. Last seen 02/11/22 for routine surveillance and was discharged from oncology. He had abnormal labs with PCP including tbili 1.4, alk phos 125, PSA 35and was referred back.   ROS   Past Medical History:  Diagnosis Date   Anemia    Arthralgia of both knees    Arthritis    "right knee" (10/01/2015)   Atrial fibrillation (HCC)    Cancer (HCC)    STAGE 1 COLON CANCER   Chronic anticoagulation 2010   Coumadin    Chronic diastolic CHF (congestive heart failure), NYHA class 2 (HCC)    Dyspnea    Dysrhythmia    Hypertension    OSA (obstructive sleep apnea) 2016    "couldn't take the mask during the testing" (10/01/2015)   Permanent atrial fibrillation (HCC) 2010   Pneumonia 3/29/2017and june 2017     Past Surgical History:  Procedure Laterality Date   COLONOSCOPY WITH PROPOFOL  N/A 03/04/2016   Procedure: COLONOSCOPY WITH PROPOFOL ;  Surgeon: Alvis Jourdain, MD;  Location: [facility];  Service: Endoscopy;  Laterality: N/A;   COLONOSCOPY WITH PROPOFOL  N/A 09/15/2017   Procedure: COLONOSCOPY WITH PROPOFOL ;  Surgeon: Alvis Jourdain, MD;  Location: [facility];  Service: Endoscopy;  Laterality: N/A;   COLONOSCOPY WITH PROPOFOL  N/A 02/12/2021   Procedure: COLONOSCOPY WITH PROPOFOL ;  Surgeon: Alvis Jourdain, MD;  Location: [facility];  Service: Endoscopy;  Laterality: N/A;   LAPAROSCOPIC PARTIAL COLECTOMY N/A 04/12/2016   Procedure: LAPAROSCOPIC ILEOCOLECTOMY;  Surgeon: Lockie Rima, MD;  Location: [facility] OR;  Service: General;  Laterality: N/A;   PORT-A-CATH REMOVAL N/A 02/16/2017   Procedure: REMOVAL PORT-A-CATH;  Surgeon: Lockie Rima, MD;  Location: [facility] OR;  Service: General;  Laterality: N/A;   PORTACATH PLACEMENT N/A 05/18/2016   Procedure: INSERTION PORT-A-CATH;  Surgeon: Lockie Rima, MD;  Location: [facility];  Service: General;  Laterality: N/A;   THORACOTOMY Right 2010     Outpatient Encounter Medications as of 12/11/2023  Medication Sig   acetaminophen  (TYLENOL ) 500 MG tablet Take 1 tablet (500 mg total) by mouth every 6 (six) hours as needed. Take 1 tablet 500 mg 3 times daily x5 days, then every 6 hours as needed.   albuterol  (VENTOLIN  HFA) 108 (90 Base) MCG/ACT inhaler Inhale 2 puffs into the lungs every 6 (six)  hours as needed for wheezing or shortness of breath.   allopurinol  (ZYLOPRIM ) 100 MG tablet Take 1 tablet by mouth once daily   apixaban  (ELIQUIS ) 5 MG TABS tablet TAKE 1 TABLET BY MOUTH IN THE MORNING AND AT BEDTIME   aspirin  EC 81 MG tablet Take 81 mg by mouth every morning.   colchicine  0.6 MG tablet Take 1 tablet (0.6 mg  total) by mouth daily.   diclofenac  Sodium (VOLTAREN ) 1 % GEL Apply 2 g topically 4 (four) times daily as needed (for joint pain).   diltiazem  (CARDIZEM ) 120 MG tablet Take 1 tablet (120 mg total) by mouth 3 (three) times daily.   empagliflozin  (JARDIANCE ) 10 MG TABS tablet TAKE 1 TABLET BY MOUTH ONCE DAILY BEFORE BREAKFAST   furosemide  (LASIX ) 40 MG tablet TAKE 1 TABLET EVERY DAY   levETIRAcetam  (KEPPRA ) 250 MG tablet Take 1 tablet (250 mg total) by mouth 2 (two) times daily. Recommend Keppra   250 mg twice daily for a month followed by to 50 mg once daily and then discontinue   lisinopril  (ZESTRIL ) 5 MG tablet Take 1 tablet (5 mg total) by mouth daily.   MAGNESIUM -OXIDE 400 (240 Mg) MG tablet Take 1 tablet by mouth 2 (two) times daily.   pantoprazole  (PROTONIX ) 40 MG tablet Take 1 tablet by mouth once daily   polyethylene glycol powder (GLYCOLAX /MIRALAX ) 17 GM/SCOOP powder Measure  1 capful (17 g) & mix with 4 oz of water and drink by mouth daily.   potassium chloride  (KLOR-CON ) 10 MEQ tablet Take 1 tablet (10 mEq total) by mouth daily.   senna-docusate (SENOKOT-S) 8.6-50 MG tablet Take 1 tablet by mouth 2 (two) times daily.   No facility-administered encounter medications on file as of 12/11/2023.     There were no vitals filed for this visit. There is no height or weight on file to calculate BMI.   ECOG PERFORMANCE STATUS: {CHL ONC ECOG [phone]}  PHYSICAL EXAM GENERAL:alert, no distress and comfortable SKIN: no rash  EYES: sclera clear NECK: without mass LYMPH:  no palpable cervical or supraclavicular lymphadenopathy  LUNGS: clear with normal breathing effort HEART: regular rate & rhythm, no lower extremity edema ABDOMEN: abdomen soft, non-tender and normal bowel sounds NEURO: alert & oriented x 3 with fluent speech, no focal motor/sensory deficits Breast exam:  PAC without erythema    CBC    Latest Ref Rng & Units 12/05/2023   10:37 AM 07/12/2022    3:05 PM 02/11/2022     2:16 PM  CBC  WBC 4.0 - 10.5 K/uL 4.5  5.7  7.6   Hemoglobin 13.0 - 17.0 g/dL 16.1  09.6  04.5   Hematocrit 39.0 - 52.0 % 52.0  44.5  42.6   Platelets 150.0 - 400.0 K/uL 118.0  176.0  265       CMP     Latest Ref Rng & Units 12/05/2023   10:37 AM 11/30/2022    9:59 AM 09/12/2022    9:41 AM  CMP  Glucose 70 - 99 mg/dL 75  67    BUN 6 - 23 mg/dL 16  16    Creatinine 4.09 - 1.50 mg/dL 8.11  9.14    Sodium 782 - 145 mEq/L 139  142    Potassium 3.5 - 5.1 mEq/L 3.9  4.2    Chloride 96 - 112 mEq/L 103  99    CO2 19 - 32 mEq/L 30  26    Calcium  8.4 - 10.5 mg/dL 8.9  9.5  Total Protein 6.0 - 8.3 g/dL 7.0   6.7   Total Bilirubin 0.2 - 1.2 mg/dL 1.4   0.7   Alkaline Phos 39 - 117 U/L 125   127   AST 0 - 37 U/L 17   19   ALT 0 - 53 U/L 15   20       ASSESSMENT & PLAN:  Right colon cancer, invasive adenocarcinoma,G3,  pT2N1M0, stage IIIA, MSI-H -S/p complete surgical resection and 3 months adjuvant chemo (FOLFOX) -His tumor has MSI high, but ML H1 hypermethylation present, supports sporadic colon cancer, 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 -PAC removed by Dr. Cherlynn Cornfield 02/2017, incision is well healed.  -He was due for colonoscopy in 03/2017, he has not yet made an appointment; he will call Dr. Cleora Daft office to schedule      PLAN:  No orders of the defined types were placed in this encounter.     All questions were answered. The patient knows to call the clinic with any problems, questions or concerns. No barriers to learning were detected. I spent *** counseling the patient face to face. The total time spent in the appointment was *** and more than 50% was on counseling, review of test results, and coordination of care.   Brylan Dec K Tametria Aho, NP 12/09/2023 3:32 PM

## 2023-12-11 ENCOUNTER — Encounter: Payer: Self-pay | Admitting: Hematology

## 2023-12-11 ENCOUNTER — Inpatient Hospital Stay: Attending: Internal Medicine

## 2023-12-11 ENCOUNTER — Inpatient Hospital Stay: Admitting: Nurse Practitioner

## 2023-12-11 ENCOUNTER — Encounter: Payer: Self-pay | Admitting: Nurse Practitioner

## 2023-12-11 ENCOUNTER — Other Ambulatory Visit: Payer: Self-pay

## 2023-12-11 VITALS — BP 130/88 | HR 82 | Temp 97.6°F | Resp 17 | Wt 264.7 lb

## 2023-12-11 DIAGNOSIS — M1990 Unspecified osteoarthritis, unspecified site: Secondary | ICD-10-CM | POA: Diagnosis not present

## 2023-12-11 DIAGNOSIS — R972 Elevated prostate specific antigen [PSA]: Secondary | ICD-10-CM | POA: Insufficient documentation

## 2023-12-11 DIAGNOSIS — Z791 Long term (current) use of non-steroidal anti-inflammatories (NSAID): Secondary | ICD-10-CM | POA: Diagnosis not present

## 2023-12-11 DIAGNOSIS — R7989 Other specified abnormal findings of blood chemistry: Secondary | ICD-10-CM | POA: Insufficient documentation

## 2023-12-11 DIAGNOSIS — C182 Malignant neoplasm of ascending colon: Secondary | ICD-10-CM | POA: Diagnosis not present

## 2023-12-11 DIAGNOSIS — Z9221 Personal history of antineoplastic chemotherapy: Secondary | ICD-10-CM | POA: Insufficient documentation

## 2023-12-11 LAB — CBC WITH DIFFERENTIAL (CANCER CENTER ONLY)
Abs Immature Granulocytes: 0.03 10*3/uL (ref 0.00–0.07)
Basophils Absolute: 0.1 10*3/uL (ref 0.0–0.1)
Basophils Relative: 1 %
Eosinophils Absolute: 0.2 10*3/uL (ref 0.0–0.5)
Eosinophils Relative: 4 %
HCT: 51.9 % (ref 39.0–52.0)
Hemoglobin: 16.9 g/dL (ref 13.0–17.0)
Immature Granulocytes: 1 %
Lymphocytes Relative: 22 %
Lymphs Abs: 1.3 10*3/uL (ref 0.7–4.0)
MCH: 27 pg (ref 26.0–34.0)
MCHC: 32.6 g/dL (ref 30.0–36.0)
MCV: 82.8 fL (ref 80.0–100.0)
Monocytes Absolute: 0.5 10*3/uL (ref 0.1–1.0)
Monocytes Relative: 9 %
Neutro Abs: 3.8 10*3/uL (ref 1.7–7.7)
Neutrophils Relative %: 63 %
Platelet Count: 132 10*3/uL — ABNORMAL LOW (ref 150–400)
RBC: 6.27 MIL/uL — ABNORMAL HIGH (ref 4.22–5.81)
RDW: 15.4 % (ref 11.5–15.5)
WBC Count: 6 10*3/uL (ref 4.0–10.5)
nRBC: 0 % (ref 0.0–0.2)

## 2023-12-11 LAB — CMP (CANCER CENTER ONLY)
ALT: 14 U/L (ref 0–44)
AST: 20 U/L (ref 15–41)
Albumin: 4 g/dL (ref 3.5–5.0)
Alkaline Phosphatase: 123 U/L (ref 38–126)
Anion gap: 4 — ABNORMAL LOW (ref 5–15)
BUN: 18 mg/dL (ref 8–23)
CO2: 29 mmol/L (ref 22–32)
Calcium: 9.2 mg/dL (ref 8.9–10.3)
Chloride: 104 mmol/L (ref 98–111)
Creatinine: 1.16 mg/dL (ref 0.61–1.24)
GFR, Estimated: >60 mL/min (ref >60)
Glucose, Bld: 96 mg/dL (ref 70–99)
Potassium: 4.2 mmol/L (ref 3.5–5.1)
Sodium: 137 mmol/L (ref 135–145)
Total Bilirubin: 1 mg/dL (ref 0.0–1.2)
Total Protein: 7.2 g/dL (ref 6.5–8.1)

## 2023-12-11 LAB — CEA (ACCESS): CEA (CHCC): 3.89 ng/mL (ref 0.00–5.00)

## 2023-12-12 ENCOUNTER — Ambulatory Visit: Payer: Self-pay | Admitting: Nurse Practitioner

## 2023-12-12 ENCOUNTER — Encounter: Payer: Self-pay | Admitting: Nurse Practitioner

## 2023-12-12 ENCOUNTER — Other Ambulatory Visit (HOSPITAL_BASED_OUTPATIENT_CLINIC_OR_DEPARTMENT_OTHER): Payer: Self-pay | Admitting: *Deleted

## 2023-12-12 LAB — PROSTATE-SPECIFIC AG, SERUM (LABCORP): Prostate Specific Ag, Serum: 33 ng/mL — ABNORMAL HIGH (ref 0.0–4.0)

## 2023-12-12 MED ORDER — EMPAGLIFLOZIN 10 MG PO TABS: 10.0000 mg | ORAL_TABLET | Freq: Every day | ORAL | 1 refills | Status: DC

## 2023-12-29 ENCOUNTER — Other Ambulatory Visit: Payer: Self-pay | Admitting: Family

## 2023-12-31 ENCOUNTER — Other Ambulatory Visit: Payer: Self-pay | Admitting: Family

## 2024-01-03 ENCOUNTER — Telehealth: Payer: Self-pay | Admitting: Family

## 2024-01-03 DIAGNOSIS — R7989 Other specified abnormal findings of blood chemistry: Secondary | ICD-10-CM

## 2024-01-03 NOTE — Telephone Encounter (Signed)
 Need labs for 7/7 appointment

## 2024-01-03 NOTE — Telephone Encounter (Signed)
 Chart reviewed, CBC ordered.

## 2024-01-08 ENCOUNTER — Other Ambulatory Visit

## 2024-02-01 ENCOUNTER — Other Ambulatory Visit: Payer: Self-pay | Admitting: Internal Medicine

## 2024-02-01 DIAGNOSIS — I5032 Chronic diastolic (congestive) heart failure: Secondary | ICD-10-CM

## 2024-02-24 ENCOUNTER — Other Ambulatory Visit: Payer: Self-pay | Admitting: Internal Medicine

## 2024-02-24 DIAGNOSIS — I5032 Chronic diastolic (congestive) heart failure: Secondary | ICD-10-CM

## 2024-03-19 ENCOUNTER — Other Ambulatory Visit: Payer: Self-pay | Admitting: Urology

## 2024-03-19 DIAGNOSIS — R972 Elevated prostate specific antigen [PSA]: Secondary | ICD-10-CM

## 2024-03-20 ENCOUNTER — Encounter: Payer: Self-pay | Admitting: Hematology

## 2024-03-21 ENCOUNTER — Telehealth: Payer: Self-pay

## 2024-03-21 NOTE — Telephone Encounter (Signed)
   Pre-operative Risk Assessment    Patient Name: Steven Barnett  DOB: 10-Jan-1951 MRN: [id number]   Date of last office visit: 04/27/23 DAMIEN BRAVER, NP Date of next office visit: NONE   Request for Surgical Clearance    Procedure:  PROSTATE BIOPSY  Date of Surgery:  Clearance TBD                                Surgeon:  DR LONNI HAN Surgeon's Group or Practice Name:  [facility] Phone number:  [phone]  EXT [phone] Fax number:  [phone]   Type of Clearance Requested:   - Medical  - Pharmacy:  Hold Aspirin  5 DAYS AND ELIQUIS  3 DAYS PRIOR   Type of Anesthesia:  Not Indicated   Additional requests/questions:    Signed, Lucie DELENA Ku   03/21/2024, 9:44 AM

## 2024-03-26 NOTE — Telephone Encounter (Signed)
 S/w the pt and his daughter Delon. Pt has been scheduled to see Katlyn West, NP 03/29/24 @ 10:30 at [facility].

## 2024-03-26 NOTE — Telephone Encounter (Signed)
   Name: Steven Barnett  DOB: 06/27/1951  MRN: [id number]  Primary Cardiologist: Vinie JAYSON Maxcy, MD  Chart reviewed as part of pre-operative protocol coverage. Patient was last seen in the office in 04/2023. He was advised to follow-up in 6 months but looks like this did not happen. It has now been almost one year since he was seen. Because of Steven Barnett's past medical history and time since last visit, he will require a follow-up in-office visit in order to better assess preoperative cardiovascular risk.  Pre-op covering staff: - Please schedule appointment and call patient to inform them. If patient already had an upcoming appointment within acceptable timeframe, please add pre-op clearance to the appointment notes so provider is aware. - Please contact requesting surgeon's office via preferred method (i.e, phone, fax) to inform them of need for appointment prior to surgery.   Ranette Luckadoo E Juanitta Earnhardt, PA-C  03/26/2024, 8:45 AM

## 2024-03-26 NOTE — Telephone Encounter (Signed)
 Patient with diagnosis of afib on Eliquis  for anticoagulation.    Procedure: PROSTATE BIOPSY  Date of procedure: TBD   CHA2DS2-VASc Score = 3   This indicates a 3.2% annual risk of stroke. The patient's score is based upon: CHF History: 1 HTN History: 1 Diabetes History: 0 Stroke History: 0 Vascular Disease History: 0 Age Score: 1 Gender Score: 0      CrCl 74 ml/min Platelet count 132  Patient has not had an Afib/aflutter ablation or Watchman within the last 3 months or DCCV within the last 30 days   Per office protocol, patient can hold Eliquis  for 3 days prior to procedure.    **This guidance is not considered finalized until pre-operative APP has relayed final recommendations.**

## 2024-03-27 NOTE — Progress Notes (Signed)
 Cardiology Office Note    Date:  03/30/2024  ID:  Steven, Barnett 1951/02/17, MRN [id number] PCP:  Jason Leita Repine, FNP (Inactive)  Cardiologist:  Vinie JAYSON Maxcy, MD  Electrophysiologist:  None   Chief Complaint: Preoperative cardiac evaluation   History of Present Illness: .    Steven Barnett is a 73 y.o. male with visit-pertinent history of chronic diastolic heart failure, permanent atrial fibrillation, hyperlipidemia, hypertension, intracranial hemorrhage, colon cancer and OSA.  Patient was hospitalized in 2010 in setting of right empyema s/p thoracotomy.  He developed postoperative atrial fibrillation and has been in atrial fibrillation since.  Patient was also diagnosed with diastolic heart failure at that time and was found to have moderate LVH by echocardiogram.  Patient establish care with Dr. Maxcy in 2017.  Patient was hospitalized in 2022 in setting of symptomatic hypotension due to severe hypovolemia, AKI.  Last echocardiogram on 10/10/2021 indicated LVEF 60 to 65%, mild concentric LVH, diastolic function could not be evaluated, RV systolic function and size was normal, normal pulmonary artery systolic pressures, LA was mildly dilated, no evidence of mitral valve regurgitation or stenosis, aortic valve regurgitation was not visualized, no stenosis was present.  He was again hospitalized in 01/2022 in setting of intracranial hemorrhage.  Warfarin was transitioned to Eliquis  at the time.  Patient was last in clinic on 04/23/2023 by Damien Braver, NP.  He remained stable from cardiac standpoint.  Today patient presents for follow-up and preoperative cardiac evaluation for prostate biopsy.  Patient reports that he has been doing well overall, he denies any chest pain, shortness of breath, lower extremity edema, orthopnea or PND.  Patient denies any palpitations or feeling of increased heart rates.  Patient denies any bleeding problems on his Eliquis .  Patient denies any cardiac  concerns or complaints today.  He is able to achieve greater than 4 METS of activity.  Labwork independently reviewed: 12/11/2023: Sodium 137, potassium 4.2, creatinine 1.16, AST 20, ALT 14, hemoglobin 16.9, hematocrit 51.9 ROS: .   Today he denies chest pain, shortness of breath, lower extremity edema, fatigue, palpitations, melena, hematuria, hemoptysis, diaphoresis, weakness, presyncope, syncope, orthopnea, and PND.  All other systems are reviewed and otherwise negative. Studies Reviewed: SABRA   EKG:  EKG is ordered today, personally reviewed, demonstrating  EKG Interpretation Date/Time:  Friday March 29 2024 10:33:07 EDT Ventricular Rate:  65 PR Interval:    QRS Duration:  94 QT Interval:  424 QTC Calculation: 440 R Axis:   -1  Text Interpretation: Atrial fibrillation Nonspecific T wave abnormality no longer evident in Lateral leads Confirmed by Ia Leeb [phone]) on 03/30/2024 1:53:28 PM   CV Studies: Cardiac studies reviewed are outlined and summarized above. Otherwise please see EMR for full report. Cardiac Studies & Procedures   ______________________________________________________________________________________________   STRESS TESTS  MYOCARDIAL PERFUSION IMAGING 02/26/2015  Interpretation Summary  This is a low risk study.  Non-gated due to atrial fibrillation  No reversible perfusion defects  Low risk study. No reversible ischemia. Non-gated due to atrial fibrillation.   ECHOCARDIOGRAM  ECHOCARDIOGRAM COMPLETE 10/10/2021  Narrative ECHOCARDIOGRAM REPORT    Patient Name:   Steven Barnett Date of Exam: 10/10/2021 Medical Rec #:  [id number]      Height:       70.0 in Accession #:    [id number]     Weight:       280.0 lb Date of Birth:  [birth year]      BSA:  2.408 m Patient Age:    58 years       BP:           120/74 mmHg Patient Gender: M              HR:           79 bpm. Exam Location:  Inpatient  Procedure: 2D Echo, Color Doppler and Cardiac  Doppler  Indications:    Stroke i63.9  History:        Patient has prior history of Echocardiogram examinations, most recent 11/04/2019. CHF, Arrythmias:Atrial Fibrillation; Risk Factors:Hypertension and Sleep Apnea.  Sonographer:    Damien Senior RDCS Referring Phys: [id number] ASHISH ARORA   Sonographer Comments: Technically difficult due to body habitus. IMPRESSIONS   1. Left ventricular ejection fraction, by estimation, is 60 to 65%. The left ventricle has normal function. Left ventricular endocardial border not optimally defined to evaluate regional wall motion. There is mild concentric left ventricular hypertrophy. Left ventricular diastolic function could not be evaluated. 2. Right ventricular systolic function is normal. The right ventricular size is normal. There is normal pulmonary artery systolic pressure. The estimated right ventricular systolic pressure is 29.2 mmHg. 3. Left atrial size was mildly dilated. 4. The mitral valve is grossly normal. No evidence of mitral valve regurgitation. No evidence of mitral stenosis. 5. The aortic valve is tricuspid. Aortic valve regurgitation is not visualized. No aortic stenosis is present. 6. The inferior vena cava is dilated in size with >50% respiratory variability, suggesting right atrial pressure of 8 mmHg.  Comparison(s): No significant change from prior study.  Conclusion(s)/Recommendation(s): No intracardiac source of embolism detected on this transthoracic study. Consider a transesophageal echocardiogram to exclude cardiac source of embolism if clinically indicated.  FINDINGS Left Ventricle: Left ventricular ejection fraction, by estimation, is 60 to 65%. The left ventricle has normal function. Left ventricular endocardial border not optimally defined to evaluate regional wall motion. The left ventricular internal cavity size was normal in size. There is mild concentric left ventricular hypertrophy. Left ventricular diastolic function  could not be evaluated due to atrial fibrillation. Left ventricular diastolic function could not be evaluated.  Right Ventricle: The right ventricular size is normal. No increase in right ventricular wall thickness. Right ventricular systolic function is normal. There is normal pulmonary artery systolic pressure. The tricuspid regurgitant velocity is 2.30 m/s, and with an assumed right atrial pressure of 8 mmHg, the estimated right ventricular systolic pressure is 29.2 mmHg.  Left Atrium: Left atrial size was mildly dilated.  Right Atrium: Right atrial size was normal in size.  Pericardium: There is no evidence of pericardial effusion.  Mitral Valve: The mitral valve is grossly normal. No evidence of mitral valve regurgitation. No evidence of mitral valve stenosis.  Tricuspid Valve: The tricuspid valve is grossly normal. Tricuspid valve regurgitation is not demonstrated. No evidence of tricuspid stenosis.  Aortic Valve: The aortic valve is tricuspid. Aortic valve regurgitation is not visualized. No aortic stenosis is present.  Pulmonic Valve: The pulmonic valve was grossly normal. Pulmonic valve regurgitation is not visualized. No evidence of pulmonic stenosis.  Aorta: The aortic root is normal in size and structure.  Venous: The inferior vena cava is dilated in size with greater than 50% respiratory variability, suggesting right atrial pressure of 8 mmHg.  IAS/Shunts: The atrial septum is grossly normal.   LEFT VENTRICLE PLAX 2D LVIDd:         4.00 cm LVIDs:  2.70 cm LV PW:         1.30 cm LV IVS:        1.20 cm LVOT diam:     2.10 cm LV SV:         49 LV SV Index:   20 LVOT Area:     3.46 cm   RIGHT VENTRICLE RV S prime:     7.28 cm/s TAPSE (M-mode): 1.8 cm  LEFT ATRIUM              Index        RIGHT ATRIUM           Index LA diam:        5.00 cm  2.08 cm/m   RA Area:     23.10 cm LA Vol (A2C):   122.0 ml 50.66 ml/m  RA Volume:   68.00 ml  28.24 ml/m LA Vol  (A4C):   86.3 ml  35.84 ml/m LA Biplane Vol: 105.0 ml 43.60 ml/m AORTIC VALVE LVOT Vmax:   66.60 cm/s LVOT Vmean:  49.800 cm/s LVOT VTI:    0.142 m  TRICUSPID VALVE TR Peak grad:   21.2 mmHg TR Vmax:        230.00 cm/s  SHUNTS Systemic VTI:  0.14 m Systemic Diam: 2.10 cm  Darryle Decent MD Electronically signed by Darryle Decent MD Signature Date/Time: 10/10/2021/2:41:41 PM    Final          ______________________________________________________________________________________________       Current Reported Medications:.    Current Meds  Medication Sig   acetaminophen  (TYLENOL ) 500 MG tablet Take 1 tablet (500 mg total) by mouth every 6 (six) hours as needed. Take 1 tablet 500 mg 3 times daily x5 days, then every 6 hours as needed.   albuterol  (VENTOLIN  HFA) 108 (90 Base) MCG/ACT inhaler Inhale 2 puffs into the lungs every 6 (six) hours as needed for wheezing or shortness of breath.   allopurinol  (ZYLOPRIM ) 100 MG tablet Take 1 tablet (100 mg total) by mouth daily.   apixaban  (ELIQUIS ) 5 MG TABS tablet TAKE 1 TABLET BY MOUTH IN THE MORNING AND AT BEDTIME   aspirin  EC 81 MG tablet Take 81 mg by mouth every morning.   colchicine  0.6 MG tablet Take 1 tablet (0.6 mg total) by mouth daily.   diltiazem  (CARDIZEM ) 120 MG tablet Take 1 tablet (120 mg total) by mouth 3 (three) times daily. (Patient taking differently: Take 120 mg by mouth in the morning and at bedtime.)   empagliflozin  (JARDIANCE ) 10 MG TABS tablet Take 1 tablet (10 mg total) by mouth daily before breakfast.   furosemide  (LASIX ) 40 MG tablet TAKE 1 TABLET EVERY DAY (NEED MD APPOINTMENT)   lisinopril  (ZESTRIL ) 5 MG tablet Take 1 tablet (5 mg total) by mouth daily.   pantoprazole  (PROTONIX ) 40 MG tablet Take 1 tablet by mouth once daily   potassium chloride  (KLOR-CON ) 10 MEQ tablet Take 1 tablet (10 mEq total) by mouth daily.   Physical Exam:    VS:  BP 128/86   Pulse 65   Ht 5' 10 (1.778 m)   Wt 268 lb 6.4  oz (121.7 kg)   SpO2 98%   BMI 38.51 kg/m    Wt Readings from Last 3 Encounters:  03/29/24 268 lb 6.4 oz (121.7 kg)  12/11/23 264 lb 11.2 oz (120.1 kg)  12/05/23 268 lb 3.2 oz (121.7 kg)    GEN: Well nourished, well developed in no acute distress NECK:  No JVD; No carotid bruits CARDIAC: IRIR, no murmurs, rubs, gallops RESPIRATORY:  Clear to auscultation without rales, wheezing or rhonchi  ABDOMEN: Soft, non-tender, non-distended EXTREMITIES:  No edema; No acute deformity     Asessement and Plan:.    Preoperative cardiac evaluation: Patient currently pending prostate biopsy. Mr. Cyr perioperative risk of a major cardiac event is 0.9% according to the Revised Cardiac Risk Index (RCRI).  Therefore, he is at low risk for perioperative complications.   His functional capacity is fair at 5.07 METs according to the Duke Activity Status Index (DASI). Recommendations:According to ACC/AHA guidelines, no further cardiovascular testing needed.  The patient may proceed to surgery at acceptable risk.  Antiplatelet and/or Anticoagulation Recommendations:Eliquis  (Apixaban ) can be held for 3 days prior to surgery.  Please resume post op when felt to be safe.  Aspirin  may be held for 5 days prior to procedure.   Chronic diastolic HF: Echocardiogram on 10/10/2021 indicated LVEF 60 to 65%, mild concentric LVH, diastolic function could not be evaluated, RV systolic function and size was normal, normal pulmonary artery systolic pressures, LA was mildly dilated, no evidence of mitral valve regurgitation or stenosis, aortic valve regurgitation was not visualized, no stenosis was present. Today he reports that he is doing well, denies any shortness of breath, lower extremity edema, orthopnea or PND.  Patient appears euvolemic and well compensated on exam.  Continue Jardiance  10 mg daily, Lasix  40 mg daily, lisinopril  5 mg daily.  Permanent atrial fibrillation: Patient with history of permanent atrial fibrillation. He  reports that he is doing well, denies any significant palpitations or feeling of increased heart rates.  EKG today indicates atrial fibrillation that is rate controlled at 65 bpm.  Patient denies any bleeding problems on Eliquis . CHA2DS2-VASc Score = 3 [CHF History: 1, HTN History: 1, Diabetes History: 0, Stroke History: 0, Vascular Disease History: 0, Age Score: 1, Gender Score: 0].  Therefore, the patient's annual risk of stroke is 3.2 %.      Hypertension: Blood pressure today 128/86.  Continue current antihypertensive regimen.  Hyperlipidemia: Last lipid profile on 12/05/2023 indicated total cholesterol 123, HDL 54, triglycerides 99 and LDL 49.  Patient has not required statin medications.  OSA: Patient reports that he does not use CPAP device.    Disposition: F/u with Dr. Mona in six months or sooner if needed.   Signed, Lars Jeziorski D Edward Trevino, NP

## 2024-03-29 ENCOUNTER — Ambulatory Visit: Attending: Cardiology | Admitting: Cardiology

## 2024-03-29 ENCOUNTER — Encounter: Payer: Self-pay | Admitting: Cardiology

## 2024-03-29 VITALS — BP 128/86 | HR 65 | Ht 70.0 in | Wt 268.4 lb

## 2024-03-29 DIAGNOSIS — I5032 Chronic diastolic (congestive) heart failure: Secondary | ICD-10-CM | POA: Diagnosis not present

## 2024-03-29 DIAGNOSIS — Z01810 Encounter for preprocedural cardiovascular examination: Secondary | ICD-10-CM | POA: Diagnosis not present

## 2024-03-29 DIAGNOSIS — E785 Hyperlipidemia, unspecified: Secondary | ICD-10-CM | POA: Diagnosis not present

## 2024-03-29 DIAGNOSIS — I10 Essential (primary) hypertension: Secondary | ICD-10-CM | POA: Diagnosis not present

## 2024-03-29 DIAGNOSIS — I4821 Permanent atrial fibrillation: Secondary | ICD-10-CM

## 2024-03-29 DIAGNOSIS — G4733 Obstructive sleep apnea (adult) (pediatric): Secondary | ICD-10-CM

## 2024-03-29 NOTE — Patient Instructions (Addendum)
  Medication Instructions:  Your physician recommends that you continue on your current medications as directed. Please refer to the Current Medication list given to you today. *If you need a refill on your cardiac medications before your next appointment, please call your pharmacy*  Lab Work: None ordered If you have labs (blood work) drawn today and your tests are completely normal, you will receive your results only by: MyChart Message (if you have MyChart) OR A paper copy in the mail If you have any lab test that is abnormal or we need to change your treatment, we will call you to review the results.  Testing/Procedures: None ordered  Follow-Up: At [facility], you and your health needs are our priority.  As part of our continuing mission to provide you with exceptional heart care, our providers are all part of one team.  This team includes your primary Cardiologist (physician) and Advanced Practice Providers or APPs (Physician Assistants and Nurse Practitioners) who all work together to provide you with the care you need, when you need it.  Your next appointment:   6 month(s)  Provider:   Vinie JAYSON Maxcy, MD    We recommend signing up for the patient portal called MyChart.  Sign up information is provided on this After Visit Summary.  MyChart is used to connect with patients for Virtual Visits (Telemedicine).  Patients are able to view lab/test results, encounter notes, upcoming appointments, etc.  Non-urgent messages can be sent to your provider as well.   To learn more about what you can do with MyChart, go to [email or web address].   Other Instructions

## 2024-03-30 ENCOUNTER — Encounter: Payer: Self-pay | Admitting: Cardiology

## 2024-04-01 NOTE — Telephone Encounter (Signed)
 OV encounter routed to surgeon's office

## 2024-04-01 NOTE — Telephone Encounter (Signed)
Office is calling for update. Please advise 

## 2024-04-08 ENCOUNTER — Other Ambulatory Visit: Payer: Self-pay | Admitting: *Deleted

## 2024-04-22 ENCOUNTER — Other Ambulatory Visit

## 2024-04-23 ENCOUNTER — Ambulatory Visit
Admission: RE | Admit: 2024-04-23 | Discharge: 2024-04-23 | Disposition: A | Discharge status: Home / Self Care | Source: Ambulatory Visit | Attending: Urology | Admitting: Urology

## 2024-04-23 DIAGNOSIS — R972 Elevated prostate specific antigen [PSA]: Secondary | ICD-10-CM

## 2024-04-23 MED ORDER — GADOPICLENOL 0.5 MMOL/ML IV SOLN
10.0000 mL | Freq: Once | INTRAVENOUS | Status: AC | PRN
Start: 2024-04-23 — End: 2024-04-23
  Administered 2024-04-23: 10 mL via INTRAVENOUS

## 2024-04-24 ENCOUNTER — Other Ambulatory Visit: Payer: Self-pay | Admitting: Internal Medicine

## 2024-04-24 DIAGNOSIS — I4891 Unspecified atrial fibrillation: Secondary | ICD-10-CM

## 2024-04-24 NOTE — Telephone Encounter (Signed)
 Eliquis  5mg  refill request received. Patient is 73 years old, weight-121.7kg, Crea-1.16 on 12/11/23, Diagnosis-Afib, and last seen by Katlyn West on 03/29/24. Dose is appropriate based on dosing criteria. Will send in refill to requested pharmacy.

## 2024-05-31 ENCOUNTER — Other Ambulatory Visit: Payer: Self-pay | Admitting: Internal Medicine

## 2024-05-31 DIAGNOSIS — I4891 Unspecified atrial fibrillation: Secondary | ICD-10-CM

## 2024-06-03 ENCOUNTER — Other Ambulatory Visit: Payer: Self-pay

## 2024-06-03 ENCOUNTER — Encounter: Payer: Self-pay | Admitting: Emergency Medicine

## 2024-06-03 ENCOUNTER — Emergency Department (HOSPITAL_COMMUNITY)

## 2024-06-03 ENCOUNTER — Encounter (HOSPITAL_COMMUNITY): Payer: Self-pay

## 2024-06-03 ENCOUNTER — Ambulatory Visit
Admission: EM | Admit: 2024-06-03 | Discharge: 2024-06-03 | Disposition: A | Discharge status: Home / Self Care | Attending: Emergency Medicine | Admitting: Emergency Medicine

## 2024-06-03 ENCOUNTER — Emergency Department (HOSPITAL_COMMUNITY)
Admission: EM | Admit: 2024-06-03 | Discharge: 2024-06-03 | Disposition: A | Discharge status: Home / Self Care | Attending: Emergency Medicine | Admitting: Emergency Medicine

## 2024-06-03 DIAGNOSIS — Z7982 Long term (current) use of aspirin: Secondary | ICD-10-CM | POA: Insufficient documentation

## 2024-06-03 DIAGNOSIS — R059 Cough, unspecified: Secondary | ICD-10-CM | POA: Diagnosis present

## 2024-06-03 DIAGNOSIS — I4892 Unspecified atrial flutter: Secondary | ICD-10-CM

## 2024-06-03 DIAGNOSIS — R079 Chest pain, unspecified: Secondary | ICD-10-CM | POA: Diagnosis not present

## 2024-06-03 DIAGNOSIS — I2510 Atherosclerotic heart disease of native coronary artery without angina pectoris: Secondary | ICD-10-CM | POA: Diagnosis not present

## 2024-06-03 DIAGNOSIS — R051 Acute cough: Secondary | ICD-10-CM | POA: Diagnosis not present

## 2024-06-03 DIAGNOSIS — R052 Subacute cough: Secondary | ICD-10-CM | POA: Diagnosis not present

## 2024-06-03 DIAGNOSIS — I482 Chronic atrial fibrillation, unspecified: Secondary | ICD-10-CM | POA: Insufficient documentation

## 2024-06-03 DIAGNOSIS — Z7901 Long term (current) use of anticoagulants: Secondary | ICD-10-CM | POA: Insufficient documentation

## 2024-06-03 DIAGNOSIS — I509 Heart failure, unspecified: Secondary | ICD-10-CM | POA: Insufficient documentation

## 2024-06-03 DIAGNOSIS — Z760 Encounter for issue of repeat prescription: Secondary | ICD-10-CM | POA: Diagnosis not present

## 2024-06-03 LAB — BASIC METABOLIC PANEL WITH GFR
Anion gap: 11 (ref 5–15)
BUN: 16 mg/dL (ref 8–23)
CO2: 23 mmol/L (ref 22–32)
Calcium: 8.6 mg/dL — ABNORMAL LOW (ref 8.9–10.3)
Chloride: 101 mmol/L (ref 98–111)
Creatinine, Ser: 1.09 mg/dL (ref 0.61–1.24)
GFR, Estimated: >60 mL/min (ref >60)
Glucose, Bld: 93 mg/dL (ref 70–99)
Potassium: 4.3 mmol/L (ref 3.5–5.1)
Sodium: 135 mmol/L (ref 135–145)

## 2024-06-03 LAB — CBC
HCT: 54.3 % — ABNORMAL HIGH (ref 39.0–52.0)
Hemoglobin: 17 g/dL (ref 13.0–17.0)
MCH: 27.2 pg (ref 26.0–34.0)
MCHC: 31.3 g/dL (ref 30.0–36.0)
MCV: 86.9 fL (ref 80.0–100.0)
Platelets: 159 K/uL (ref 150–400)
RBC: 6.25 MIL/uL — ABNORMAL HIGH (ref 4.22–5.81)
RDW: 16.1 % — ABNORMAL HIGH (ref 11.5–15.5)
WBC: 6 K/uL (ref 4.0–10.5)
nRBC: 0 % (ref 0.0–0.2)

## 2024-06-03 LAB — RESP PANEL BY RT-PCR (RSV, FLU A&B, COVID)  RVPGX2
Influenza A by PCR: NEGATIVE
Influenza B by PCR: NEGATIVE
Resp Syncytial Virus by PCR: NEGATIVE
SARS Coronavirus 2 by RT PCR: NEGATIVE

## 2024-06-03 LAB — MAGNESIUM: Magnesium: 1.9 mg/dL (ref 1.7–2.4)

## 2024-06-03 LAB — BRAIN NATRIURETIC PEPTIDE: B Natriuretic Peptide: 103.1 pg/mL — ABNORMAL HIGH (ref 0.0–100.0)

## 2024-06-03 LAB — TROPONIN I (HIGH SENSITIVITY): Troponin I (High Sensitivity): 6 ng/L (ref <18)

## 2024-06-03 MED ORDER — APIXABAN 5 MG PO TABS: 5.0000 mg | ORAL_TABLET | Freq: Two times a day (BID) | ORAL | 0 refills | Status: DC

## 2024-06-03 NOTE — Discharge Instructions (Addendum)
 Go straight to ER for further evaluation of A flutter, chest pain for months, productive cough, do not eat or drink until seen by Er provider.

## 2024-06-03 NOTE — ED Triage Notes (Signed)
 Pt c/o cough for over 3 weeks. States he does have some chest pain when he coughs hard

## 2024-06-03 NOTE — ED Triage Notes (Signed)
 Denies CP and SHOB

## 2024-06-03 NOTE — Discharge Instructions (Signed)
 Return for any problem.  ?

## 2024-06-03 NOTE — ED Provider Notes (Signed)
 [facility]    CSN: [id number] Arrival date & time: 06/03/24  1004      History   Chief Complaint Chief Complaint  Patient presents with   Cough   Chest Pain    HPI Steven Barnett is a 73 y.o. male.   73 year old male patient, Steven Barnett, presents to urgent care for evaluation of productive cough x 3 weeks, chest pain for several months.   Pmh: Congestive heart failure, A-fib on anticoagulation, anemia, HTN,colon cancer, pneumonia  The history is provided by the patient and a relative. No language interpreter was used.    Past Medical History:  Diagnosis Date   Anemia    Arthralgia of both knees    Arthritis    right knee (10/01/2015)   Atrial fibrillation (HCC)    Cancer (HCC)    STAGE 1 COLON CANCER   Chronic anticoagulation 2010   Coumadin    Chronic diastolic CHF (congestive heart failure), NYHA class 2 (HCC)    Dyspnea    Dysrhythmia    Hypertension    OSA (obstructive sleep apnea) 2016   couldn't take the mask during the testing (10/01/2015)   Permanent atrial fibrillation (HCC) 2010   Pneumonia 3/29/2017and june 2017    Patient Active Problem List   Diagnosis Date Noted   Atrial flutter (HCC) 06/03/2024   Chest pain 06/03/2024   Acute cough 06/03/2024   Chronic bronchitis, unspecified chronic bronchitis type (HCC) 12/05/2023   Constipation 10/16/2021   Atrial fibrillation with rapid ventricular response (HCC) 10/13/2021   Hyponatremia 10/13/2021   Gout 10/13/2021   Hypertensive emergency    Seizure (HCC)    ICH (intracerebral hemorrhage) (HCC) 10/09/2021   Hypovolemia 01/14/2021   Hypokalemia 01/14/2021   Hypomagnesemia 01/14/2021   Normocytic anemia 01/14/2021   Gait instability 01/14/2021   Hypotension 08/17/2016   SIRS (systemic inflammatory response syndrome) (HCC) 08/17/2016   Influenza A 08/17/2016   Septic shock (HCC) 08/10/2016   Neutropenia, drug-induced    Gastroenteritis due to norovirus    Acute kidney injury     Genetic testing 07/01/2016   Port catheter in place 06/29/2016   Family history of colon cancer 06/08/2016   Family history of colonic polyps 06/08/2016   Cancer of right colon (HCC) 04/12/2016   Pre-operative cardiovascular examination 04/06/2016   Acute on chronic diastolic heart failure (HCC) 02/02/2016   Acute respiratory failure with hypoxia (HCC) 02/02/2016   Anemia, iron deficiency 02/02/2016   Current use of long term anticoagulation 12/03/2015   Pneumonia 09/30/2015   PNA (pneumonia) 09/30/2015   Acute on chronic congestive heart failure (HCC)    CHF exacerbation (HCC) 07/08/2015   Community acquired pneumonia 02/10/2015   ED (erectile dysfunction) 08/12/2014   Arthralgia of both knees 04/08/2014   Chronic diastolic (congestive) heart failure (HCC) 01/14/2009   Morbid obesity (HCC) 12/31/2008   Obstructive sleep apnea 12/31/2008   Essential hypertension, benign 12/31/2008   Atrial fibrillation (HCC) 12/31/2008    Past Surgical History:  Procedure Laterality Date   COLONOSCOPY WITH PROPOFOL  N/A 03/04/2016   Procedure: COLONOSCOPY WITH PROPOFOL ;  Surgeon: Belvie Just, MD;  Location: [facility];  Service: Endoscopy;  Laterality: N/A;   COLONOSCOPY WITH PROPOFOL  N/A 09/15/2017   Procedure: COLONOSCOPY WITH PROPOFOL ;  Surgeon: Just Belvie, MD;  Location: [facility];  Service: Endoscopy;  Laterality: N/A;   COLONOSCOPY WITH PROPOFOL  N/A 02/12/2021   Procedure: COLONOSCOPY WITH PROPOFOL ;  Surgeon: Just Belvie, MD;  Location: [facility];  Service: Endoscopy;  Laterality:  N/A;   LAPAROSCOPIC PARTIAL COLECTOMY N/A 04/12/2016   Procedure: LAPAROSCOPIC ILEOCOLECTOMY;  Surgeon: Jina Nephew, MD;  Location: [facility];  Service: General;  Laterality: N/A;   PORT-A-CATH REMOVAL N/A 02/16/2017   Procedure: REMOVAL PORT-A-CATH;  Surgeon: Nephew Jina, MD;  Location: [facility];  Service: General;  Laterality: N/A;   PORTACATH PLACEMENT N/A 05/18/2016   Procedure: INSERTION PORT-A-CATH;   Surgeon: Jina Nephew, MD;  Location: [facility];  Service: General;  Laterality: N/A;   THORACOTOMY Right 2010       Home Medications    Prior to Admission medications   Medication Sig Start Date End Date Taking? Authorizing Provider  acetaminophen  (TYLENOL ) 500 MG tablet Take 1 tablet (500 mg total) by mouth every 6 (six) hours as needed. Take 1 tablet 500 mg 3 times daily x5 days, then every 6 hours as needed. 10/18/21   Sebastian Toribio GAILS, MD  albuterol  (VENTOLIN  HFA) [phone] Base) MCG/ACT inhaler Inhale 2 puffs into the lungs every 6 (six) hours as needed for wheezing or shortness of breath. 06/21/22   Jason Leita Repine, FNP  allopurinol  (ZYLOPRIM ) 100 MG tablet Take 1 tablet (100 mg total) by mouth daily. 01/01/24   Jason Leita Repine, FNP  apixaban  (ELIQUIS ) 5 MG TABS tablet TAKE 1 TABLET EVERY MORNING AND TAKE 1 TABLET AT BEDTIME 04/24/24   Hilty, Vinie BROCKS, MD  aspirin  EC 81 MG tablet Take 81 mg by mouth every morning.    [provider]  colchicine  0.6 MG tablet Take 1 tablet (0.6 mg total) by mouth daily. 12/05/23   Jason Leita Repine, FNP  diltiazem  (CARDIZEM ) 120 MG tablet Take 1 tablet (120 mg total) by mouth 3 (three) times daily. Patient taking differently: Take 120 mg by mouth in the morning and at bedtime. 12/05/23   Jason Leita Repine, FNP  empagliflozin  (JARDIANCE ) 10 MG TABS tablet Take 1 tablet (10 mg total) by mouth daily before breakfast. 12/12/23   Walker, Caitlin S, NP  furosemide  (LASIX ) 40 MG tablet TAKE 1 TABLET EVERY DAY (NEED MD APPOINTMENT) 02/26/24   Mona Vinie BROCKS, MD  lisinopril  (ZESTRIL ) 5 MG tablet Take 1 tablet (5 mg total) by mouth daily. 12/29/23   Jason Leita Repine, FNP  MAGNESIUM -OXIDE 400 (240 Mg) MG tablet Take 1 tablet by mouth 2 (two) times daily. 02/05/22   [provider]  pantoprazole  (PROTONIX ) 40 MG tablet Take 1 tablet by mouth once daily 09/12/22   Jason Leita Repine, FNP  potassium chloride   (KLOR-CON ) 10 MEQ tablet Take 1 tablet (10 mEq total) by mouth daily. 12/05/23   Jason Leita Repine, FNP    Family History Family History  Problem Relation Age of Onset   Heart disease Father    Heart failure Father    Colon polyps Father        unspecified number - pt of intestine surgically resected   Hypertension Mother    Diabetes Mother    Hyperlipidemia Mother    Colon polyps Mother        unspecified number - pt of intestine surgically resected   Dementia Mother        d. 67   Stroke Maternal Grandmother    Stroke Sister    Colon cancer Sister 15       w/ cancerous polyps - unspecified number; underwent surgery and radiation   Colon polyps Brother        unspecified number - polypectomies   Colon cancer Sister  dx 50-60; s/p surgery and radiation   Bone cancer Maternal Uncle        dx. 60s   Lung cancer Paternal Grandfather        d. 80s-90s; lung cancer vs TB   Colon cancer Sister        dx. 53-60; s/p surgery and radiation   Cancer Maternal Uncle        mother had about 7-8 other siblings, most passed at older ages, many of whom had some type of cancer   Heart attack Neg Hx     Social History Social History   Tobacco Use   Smoking status: Former    Current packs/day: 0.00    Average packs/day: 0.1 packs/day for 2.0 years (0.2 ttl pk-yrs)    Types: Cigarettes    Start date: 12/15/1972    Quit date: 12/16/1974    Years since quitting: 49.4   Smokeless tobacco: Never   Tobacco comments:    1 pack cigarettes would last me a month  Vaping Use   Vaping status: Never Used  Substance Use Topics   Alcohol use: No   Drug use: No    Types: Cocaine, Marijuana    Comment: Hx polysubstance abuse, quit 2008     Allergies   Patient has no known allergies.   Review of Systems Review of Systems  All other systems reviewed and are negative.    Physical Exam Triage Vital Signs ED Triage Vitals  Encounter Vitals Group     BP 06/03/24 1034 129/86      Girls Systolic BP Percentile --      Girls Diastolic BP Percentile --      Boys Systolic BP Percentile --      Boys Diastolic BP Percentile --      Pulse Rate 06/03/24 1034 74     Resp 06/03/24 1034 17     Temp 06/03/24 1034 97.7 F (36.5 C)     Temp Source 06/03/24 1034 Oral     SpO2 06/03/24 1034 94 %     Weight --      Height --      Head Circumference --      Peak Flow --      Pain Score 06/03/24 1037 3     Pain Loc --      Pain Education --      Exclude from Growth Chart --    No data found.  Updated Vital Signs BP 129/86 (BP Location: Right Arm)   Pulse 74   Temp 97.7 F (36.5 C) (Oral)   Resp 17   SpO2 94%   Visual Acuity Right Eye Distance:   Left Eye Distance:   Bilateral Distance:    Right Eye Near:   Left Eye Near:    Bilateral Near:     Physical Exam Vitals and nursing note reviewed.  Constitutional:      Appearance: He is well-developed and well-groomed.  HENT:     Head: Normocephalic.     Right Ear: Tympanic membrane normal.     Left Ear: Tympanic membrane normal.     Nose: Nose normal.     Right Sinus: No maxillary sinus tenderness or frontal sinus tenderness.     Left Sinus: No maxillary sinus tenderness or frontal sinus tenderness.     Mouth/Throat:     Lips: Pink.     Mouth: Mucous membranes are moist.     Pharynx: Oropharynx is clear. Uvula midline.  Cardiovascular:  Rate and Rhythm: Rhythm irregular.  Pulmonary:     Breath sounds: Examination of the right-lower field reveals decreased breath sounds. Examination of the left-lower field reveals decreased breath sounds. Decreased breath sounds present.  Neurological:     General: No focal deficit present.     Mental Status: He is alert and oriented to person, place, and time.     GCS: GCS eye subscore is 4. GCS verbal subscore is 5. GCS motor subscore is 6.  Psychiatric:        Attention and Perception: Attention normal.        Mood and Affect: Mood normal.        Speech: Speech  normal.        Behavior: Behavior normal. Behavior is cooperative.      UC Treatments / Results  Labs (all labs ordered are listed, but only abnormal results are displayed) Labs Reviewed - No data to display  EKG   Radiology No results found.  Procedures Procedures (including critical care time)  Medications Ordered in UC Medications - No data to display  Initial Impression / Assessment and Plan / UC Course  I have reviewed the triage vital signs and the nursing notes.  Pertinent labs & imaging results that were available during my care of the patient were reviewed by me and considered in my medical decision making (see chart for details).  Clinical Course as of 06/03/24 1129  Mon Jun 03, 2024  1042 EKG shows A flutter with variable AV block, QTC 416, when compared to 03/29/24 EKG A fib.  Discussed with patient and son EKG findings and need for further evaluation in the emergency room(labs, imaging, monitoring), no imaging available in office, pt needs labs, monitoring, offered EMS transport,pt declined, son will drive POV to ER.  [JD]    Clinical Course User Index [JD] Onalee Steinbach, Rilla, NP    Ddx: A flutter, ACS, pneumonia, electrolyte imbalance, viral illness Final Clinical Impressions(s) / UC Diagnoses   Final diagnoses:  Atrial flutter, unspecified type (HCC)  Chest pain, unspecified type  Acute cough     Discharge Instructions      Go straight to ER for further evaluation of A flutter, chest pain for months, productive cough, do not eat or drink until seen by Er provider.    ED Prescriptions   None    PDMP not reviewed this encounter.   Aminta Rilla, NP 06/03/24 1130

## 2024-06-03 NOTE — ED Provider Notes (Signed)
  EMERGENCY DEPARTMENT AT [facility] Provider Note   CSN: [id number] Arrival date & time: 06/03/24  1129     Patient presents with: Irregular Heart Beat   Steven Barnett is a 73 y.o. male.  {Add pertinent medical, surgical, social history, OB history to HPI:32947} 3 old male with prior medical history as detailed below presents for evaluation.  Patient with known history of A-fib on anticoagulation, CHF, CAD.  Patient reports dry cough x 2 to 3 weeks.  He reports that the cough is not improved despite using OTC medications.  He denies shortness of breath.  Denies chest pain other than when he coughs very hard.  He denies fever.  He denies productive cough.  He was in urgent care and referred to the ED for further evaluation given his cardiac history.  The history is provided by the patient and medical records.       Prior to Admission medications   Medication Sig Start Date End Date Taking? Authorizing Provider  acetaminophen  (TYLENOL ) 500 MG tablet Take 1 tablet (500 mg total) by mouth every 6 (six) hours as needed. Take 1 tablet 500 mg 3 times daily x5 days, then every 6 hours as needed. 10/18/21   Sebastian Toribio GAILS, MD  albuterol  (VENTOLIN  HFA) 108 [phone] Base) MCG/ACT inhaler Inhale 2 puffs into the lungs every 6 (six) hours as needed for wheezing or shortness of breath. 06/21/22   Jason Leita Repine, FNP  allopurinol  (ZYLOPRIM ) 100 MG tablet Take 1 tablet (100 mg total) by mouth daily. 01/01/24   Jason Leita Repine, FNP  apixaban  (ELIQUIS ) 5 MG TABS tablet TAKE 1 TABLET EVERY MORNING AND TAKE 1 TABLET AT BEDTIME 04/24/24   Hilty, Vinie BROCKS, MD  aspirin  EC 81 MG tablet Take 81 mg by mouth every morning.    [provider]  colchicine  0.6 MG tablet Take 1 tablet (0.6 mg total) by mouth daily. 12/05/23   Jason Leita Repine, FNP  diltiazem  (CARDIZEM ) 120 MG tablet Take 1 tablet (120 mg total) by mouth 3 (three) times daily. Patient taking  differently: Take 120 mg by mouth in the morning and at bedtime. 12/05/23   Jason Leita Repine, FNP  empagliflozin  (JARDIANCE ) 10 MG TABS tablet Take 1 tablet (10 mg total) by mouth daily before breakfast. 12/12/23   Walker, Caitlin S, NP  furosemide  (LASIX ) 40 MG tablet TAKE 1 TABLET EVERY DAY (NEED MD APPOINTMENT) 02/26/24   Mona Vinie BROCKS, MD  lisinopril  (ZESTRIL ) 5 MG tablet Take 1 tablet (5 mg total) by mouth daily. 12/29/23   Jason Leita Repine, FNP  MAGNESIUM -OXIDE 400 (240 Mg) MG tablet Take 1 tablet by mouth 2 (two) times daily. 02/05/22   [provider]  pantoprazole  (PROTONIX ) 40 MG tablet Take 1 tablet by mouth once daily 09/12/22   Jason Leita Repine, FNP  potassium chloride  (KLOR-CON ) 10 MEQ tablet Take 1 tablet (10 mEq total) by mouth daily. 12/05/23   Jason Leita Repine, FNP    Allergies: Patient has no known allergies.    Review of Systems  All other systems reviewed and are negative.   Updated Vital Signs BP 125/73   Pulse 69   Temp 97.7 F (36.5 C)   Resp 16   Ht 5' 10 (1.778 m)   Wt 117.9 kg   SpO2 96%   BMI 37.31 kg/m   Physical Exam Vitals and nursing note reviewed.  Constitutional:      General: He is not in acute  distress.    Appearance: Normal appearance. He is well-developed.  HENT:     Head: Normocephalic and atraumatic.  Eyes:     Conjunctiva/sclera: Conjunctivae normal.     Pupils: Pupils are equal, round, and reactive to light.  Cardiovascular:     Rate and Rhythm: Normal rate. Rhythm irregular.     Heart sounds: Normal heart sounds.  Pulmonary:     Effort: Pulmonary effort is normal. No respiratory distress.     Breath sounds: Normal breath sounds.  Abdominal:     General: There is no distension.     Palpations: Abdomen is soft.     Tenderness: There is no abdominal tenderness.  Musculoskeletal:        General: No deformity. Normal range of motion.     Cervical back: Normal range of motion and neck supple.  Skin:     General: Skin is warm and dry.  Neurological:     General: No focal deficit present.     Mental Status: He is alert and oriented to person, place, and time.     (all labs ordered are listed, but only abnormal results are displayed) Labs Reviewed  BASIC METABOLIC PANEL WITH GFR - Abnormal; Notable for the following components:      Result Value   Calcium  8.6 (*)    All other components within normal limits  CBC - Abnormal; Notable for the following components:   RBC 6.25 (*)    HCT 54.3 (*)    RDW 16.1 (*)    All other components within normal limits  RESP PANEL BY RT-PCR (RSV, FLU A&B, COVID)  RVPGX2  BRAIN NATRIURETIC PEPTIDE  MAGNESIUM   TROPONIN I (HIGH SENSITIVITY)    EKG: None  Radiology: No results found.  {Document cardiac monitor, telemetry assessment procedure when appropriate:32947} Procedures   Medications Ordered in the ED - No data to display    {Click here for ABCD2, HEART and other calculators REFRESH Note before signing:1}                              Medical Decision Making Amount and/or Complexity of Data Reviewed Labs: ordered. Radiology: ordered.   ***  {Document critical care time when appropriate  Document review of labs and clinical decision tools ie [id number], etc  Document your independent review of radiology images and any outside records  Document your discussion with family members, caretakers and with consultants  Document social determinants of health affecting pt's care  Document your decision making why or why not admission, treatments were needed:32947:::1}   Final diagnoses:  None    ED Discharge Orders     None

## 2024-06-03 NOTE — ED Triage Notes (Signed)
 States he was seen at Urgent Care @ [facility], was seen to ed for further eval of atrial Flutter , denies pain,  c/o cough

## 2024-06-07 ENCOUNTER — Telehealth: Payer: Self-pay

## 2024-06-07 NOTE — Telephone Encounter (Signed)
 Faxed refill request for allopurinol  100mg  received from [facility]. I have faxed back asking them to have Pt call office. We have been trying to get in contact w/ [name] has left the office, needs to get set up w/ new PCP.

## 2024-06-13 ENCOUNTER — Inpatient Hospital Stay (HOSPITAL_COMMUNITY)
Admission: EM | Admit: 2024-06-13 | Discharge: 2024-06-21 | DRG: 862 | Disposition: A | Discharge status: Home Health | Attending: Internal Medicine | Admitting: Internal Medicine

## 2024-06-13 ENCOUNTER — Other Ambulatory Visit: Payer: Self-pay

## 2024-06-13 ENCOUNTER — Inpatient Hospital Stay (HOSPITAL_COMMUNITY)

## 2024-06-13 ENCOUNTER — Emergency Department (HOSPITAL_COMMUNITY)

## 2024-06-13 DIAGNOSIS — T8144XA Sepsis following a procedure, initial encounter: Principal | ICD-10-CM | POA: Diagnosis present

## 2024-06-13 DIAGNOSIS — R310 Gross hematuria: Secondary | ICD-10-CM | POA: Diagnosis present

## 2024-06-13 DIAGNOSIS — G4733 Obstructive sleep apnea (adult) (pediatric): Secondary | ICD-10-CM | POA: Diagnosis present

## 2024-06-13 DIAGNOSIS — Z87891 Personal history of nicotine dependence: Secondary | ICD-10-CM

## 2024-06-13 DIAGNOSIS — N138 Other obstructive and reflux uropathy: Secondary | ICD-10-CM | POA: Diagnosis present

## 2024-06-13 DIAGNOSIS — A419 Sepsis, unspecified organism: Secondary | ICD-10-CM | POA: Diagnosis not present

## 2024-06-13 DIAGNOSIS — I10 Essential (primary) hypertension: Secondary | ICD-10-CM | POA: Diagnosis present

## 2024-06-13 DIAGNOSIS — Z7982 Long term (current) use of aspirin: Secondary | ICD-10-CM

## 2024-06-13 DIAGNOSIS — R338 Other retention of urine: Secondary | ICD-10-CM | POA: Diagnosis present

## 2024-06-13 DIAGNOSIS — Y848 Other medical procedures as the cause of abnormal reaction of the patient, or of later complication, without mention of misadventure at the time of the procedure: Secondary | ICD-10-CM | POA: Diagnosis present

## 2024-06-13 DIAGNOSIS — Z8249 Family history of ischemic heart disease and other diseases of the circulatory system: Secondary | ICD-10-CM

## 2024-06-13 DIAGNOSIS — M109 Gout, unspecified: Secondary | ICD-10-CM | POA: Diagnosis present

## 2024-06-13 DIAGNOSIS — I4891 Unspecified atrial fibrillation: Secondary | ICD-10-CM | POA: Diagnosis not present

## 2024-06-13 DIAGNOSIS — I4821 Permanent atrial fibrillation: Secondary | ICD-10-CM | POA: Diagnosis present

## 2024-06-13 DIAGNOSIS — J42 Unspecified chronic bronchitis: Secondary | ICD-10-CM | POA: Diagnosis present

## 2024-06-13 DIAGNOSIS — N179 Acute kidney failure, unspecified: Secondary | ICD-10-CM | POA: Diagnosis present

## 2024-06-13 DIAGNOSIS — D72829 Elevated white blood cell count, unspecified: Secondary | ICD-10-CM | POA: Diagnosis not present

## 2024-06-13 DIAGNOSIS — N189 Chronic kidney disease, unspecified: Secondary | ICD-10-CM | POA: Diagnosis present

## 2024-06-13 DIAGNOSIS — E872 Acidosis, unspecified: Secondary | ICD-10-CM | POA: Diagnosis present

## 2024-06-13 DIAGNOSIS — I130 Hypertensive heart and chronic kidney disease with heart failure and stage 1 through stage 4 chronic kidney disease, or unspecified chronic kidney disease: Secondary | ICD-10-CM | POA: Diagnosis present

## 2024-06-13 DIAGNOSIS — I5032 Chronic diastolic (congestive) heart failure: Secondary | ICD-10-CM | POA: Diagnosis present

## 2024-06-13 DIAGNOSIS — J9601 Acute respiratory failure with hypoxia: Secondary | ICD-10-CM | POA: Diagnosis present

## 2024-06-13 DIAGNOSIS — A4151 Sepsis due to Escherichia coli [E. coli]: Secondary | ICD-10-CM | POA: Diagnosis present

## 2024-06-13 DIAGNOSIS — B9689 Other specified bacterial agents as the cause of diseases classified elsewhere: Secondary | ICD-10-CM | POA: Diagnosis present

## 2024-06-13 DIAGNOSIS — E860 Dehydration: Secondary | ICD-10-CM | POA: Diagnosis present

## 2024-06-13 DIAGNOSIS — Z833 Family history of diabetes mellitus: Secondary | ICD-10-CM

## 2024-06-13 DIAGNOSIS — Z83719 Family history of colon polyps, unspecified: Secondary | ICD-10-CM

## 2024-06-13 DIAGNOSIS — R319 Hematuria, unspecified: Secondary | ICD-10-CM | POA: Diagnosis not present

## 2024-06-13 DIAGNOSIS — I2510 Atherosclerotic heart disease of native coronary artery without angina pectoris: Secondary | ICD-10-CM | POA: Diagnosis present

## 2024-06-13 DIAGNOSIS — J441 Chronic obstructive pulmonary disease with (acute) exacerbation: Principal | ICD-10-CM

## 2024-06-13 DIAGNOSIS — Z7901 Long term (current) use of anticoagulants: Secondary | ICD-10-CM | POA: Diagnosis not present

## 2024-06-13 DIAGNOSIS — I110 Hypertensive heart disease with heart failure: Secondary | ICD-10-CM | POA: Diagnosis not present

## 2024-06-13 DIAGNOSIS — E66812 Obesity, class 2: Secondary | ICD-10-CM | POA: Diagnosis present

## 2024-06-13 DIAGNOSIS — Z85038 Personal history of other malignant neoplasm of large intestine: Secondary | ICD-10-CM

## 2024-06-13 DIAGNOSIS — D62 Acute posthemorrhagic anemia: Secondary | ICD-10-CM | POA: Diagnosis not present

## 2024-06-13 DIAGNOSIS — R6520 Severe sepsis without septic shock: Secondary | ICD-10-CM | POA: Diagnosis present

## 2024-06-13 DIAGNOSIS — Z83438 Family history of other disorder of lipoprotein metabolism and other lipidemia: Secondary | ICD-10-CM

## 2024-06-13 DIAGNOSIS — Z800 Family history of malignant neoplasm of digestive organs: Secondary | ICD-10-CM

## 2024-06-13 DIAGNOSIS — Z1152 Encounter for screening for COVID-19: Secondary | ICD-10-CM | POA: Diagnosis not present

## 2024-06-13 DIAGNOSIS — N401 Enlarged prostate with lower urinary tract symptoms: Secondary | ICD-10-CM | POA: Diagnosis present

## 2024-06-13 DIAGNOSIS — I5033 Acute on chronic diastolic (congestive) heart failure: Secondary | ICD-10-CM | POA: Diagnosis not present

## 2024-06-13 DIAGNOSIS — G9341 Metabolic encephalopathy: Secondary | ICD-10-CM | POA: Diagnosis present

## 2024-06-13 DIAGNOSIS — Z6838 Body mass index (BMI) 38.0-38.9, adult: Secondary | ICD-10-CM

## 2024-06-13 DIAGNOSIS — R972 Elevated prostate specific antigen [PSA]: Secondary | ICD-10-CM | POA: Diagnosis not present

## 2024-06-13 DIAGNOSIS — Z8679 Personal history of other diseases of the circulatory system: Secondary | ICD-10-CM

## 2024-06-13 DIAGNOSIS — Z79899 Other long term (current) drug therapy: Secondary | ICD-10-CM

## 2024-06-13 DIAGNOSIS — R6510 Systemic inflammatory response syndrome (SIRS) of non-infectious origin without acute organ dysfunction: Secondary | ICD-10-CM | POA: Diagnosis not present

## 2024-06-13 DIAGNOSIS — Z7984 Long term (current) use of oral hypoglycemic drugs: Secondary | ICD-10-CM

## 2024-06-13 DIAGNOSIS — Z8673 Personal history of transient ischemic attack (TIA), and cerebral infarction without residual deficits: Secondary | ICD-10-CM

## 2024-06-13 DIAGNOSIS — R339 Retention of urine, unspecified: Secondary | ICD-10-CM

## 2024-06-13 DIAGNOSIS — Z801 Family history of malignant neoplasm of trachea, bronchus and lung: Secondary | ICD-10-CM

## 2024-06-13 DIAGNOSIS — Z823 Family history of stroke: Secondary | ICD-10-CM

## 2024-06-13 LAB — RESPIRATORY PANEL BY PCR

## 2024-06-13 LAB — CBC WITH DIFFERENTIAL/PLATELET
Abs Immature Granulocytes: 0.04 K/uL (ref 0.00–0.07)
Basophils Absolute: 0.1 K/uL (ref 0.0–0.1)
Basophils Relative: 1 %
Eosinophils Absolute: 0.1 K/uL (ref 0.0–0.5)
Eosinophils Relative: 1 %
HCT: 51.2 % (ref 39.0–52.0)
Hemoglobin: 16.6 g/dL (ref 13.0–17.0)
Immature Granulocytes: 0 %
Lymphocytes Relative: 9 %
Lymphs Abs: 1 K/uL (ref 0.7–4.0)
MCH: 27.6 pg (ref 26.0–34.0)
MCHC: 32.4 g/dL (ref 30.0–36.0)
MCV: 85.2 fL (ref 80.0–100.0)
Monocytes Absolute: 1.3 K/uL — ABNORMAL HIGH (ref 0.1–1.0)
Monocytes Relative: 11 %
Neutro Abs: 8.7 K/uL — ABNORMAL HIGH (ref 1.7–7.7)
Neutrophils Relative %: 78 %
Platelets: 174 K/uL (ref 150–400)
RBC: 6.01 MIL/uL — ABNORMAL HIGH (ref 4.22–5.81)
RDW: 14.7 % (ref 11.5–15.5)
WBC: 11.2 K/uL — ABNORMAL HIGH (ref 4.0–10.5)
nRBC: 0 % (ref 0.0–0.2)

## 2024-06-13 LAB — COMPREHENSIVE METABOLIC PANEL WITH GFR
ALT: 15 U/L (ref 0–44)
AST: 27 U/L (ref 15–41)
Albumin: 3.3 g/dL — ABNORMAL LOW (ref 3.5–5.0)
Alkaline Phosphatase: 90 U/L (ref 38–126)
Anion gap: 11 (ref 5–15)
BUN: 16 mg/dL (ref 8–23)
CO2: 25 mmol/L (ref 22–32)
Calcium: 8.5 mg/dL — ABNORMAL LOW (ref 8.9–10.3)
Chloride: 100 mmol/L (ref 98–111)
Creatinine, Ser: 1.39 mg/dL — ABNORMAL HIGH (ref 0.61–1.24)
GFR, Estimated: 54 mL/min — ABNORMAL LOW (ref >60)
Glucose, Bld: 131 mg/dL — ABNORMAL HIGH (ref 70–99)
Potassium: 4.1 mmol/L (ref 3.5–5.1)
Sodium: 136 mmol/L (ref 135–145)
Total Bilirubin: 2.3 mg/dL — ABNORMAL HIGH (ref 0.0–1.2)
Total Protein: 7.4 g/dL (ref 6.5–8.1)

## 2024-06-13 LAB — URINALYSIS, W/ REFLEX TO CULTURE (INFECTION SUSPECTED)
Ketones, ur: NEGATIVE mg/dL
RBC / HPF: >50 RBC/hpf (ref 0–5)
Squamous Epithelial / HPF: NONE SEEN /HPF (ref 0–5)

## 2024-06-13 LAB — I-STAT CG4 LACTIC ACID, ED
Lactic Acid, Venous: 2.3 mmol/L (ref 0.5–1.9)
Lactic Acid, Venous: 5.5 mmol/L (ref 0.5–1.9)
Lactic Acid, Venous: 3 mmol/L (ref 0.5–1.9)

## 2024-06-13 LAB — BLOOD GAS, VENOUS
Acid-base deficit: 2 mmol/L (ref 0.0–2.0)
Bicarbonate: 23.1 mmol/L (ref 20.0–28.0)
O2 Saturation: 84.1 %
Patient temperature: 37
pCO2, Ven: 40 mmHg — ABNORMAL LOW (ref 44–60)
pH, Ven: 7.37 (ref 7.25–7.43)
pO2, Ven: 49 mmHg — ABNORMAL HIGH (ref 32–45)

## 2024-06-13 LAB — BRAIN NATRIURETIC PEPTIDE: B Natriuretic Peptide: 96.2 pg/mL (ref 0.0–100.0)

## 2024-06-13 LAB — TROPONIN I (HIGH SENSITIVITY)
Troponin I (High Sensitivity): 6 ng/L (ref <18)
Troponin I (High Sensitivity): 5 ng/L (ref <18)

## 2024-06-13 LAB — PROTIME-INR
INR: 1.3 — ABNORMAL HIGH (ref 0.8–1.2)
Prothrombin Time: 16.7 s — ABNORMAL HIGH (ref 11.4–15.2)

## 2024-06-13 MED ORDER — ONDANSETRON HCL 4 MG/2ML IJ SOLN
4.0000 mg | Freq: Once | INTRAMUSCULAR | Status: AC
  Administered 2024-06-13: 4 mg via INTRAVENOUS
  Filled 2024-06-13: qty 2

## 2024-06-13 MED ORDER — ALLOPURINOL 100 MG PO TABS
100.0000 mg | ORAL_TABLET | Freq: Every day | ORAL | Status: DC
  Administered 2024-06-13 – 2024-06-21 (×9): 100 mg via ORAL
  Filled 2024-06-13 (×10): qty 1

## 2024-06-13 MED ORDER — MORPHINE SULFATE (PF) 4 MG/ML IV SOLN
4.0000 mg | Freq: Once | INTRAVENOUS | Status: AC
  Administered 2024-06-13: 4 mg via INTRAVENOUS
  Filled 2024-06-13: qty 1

## 2024-06-13 MED ORDER — TAMSULOSIN HCL 0.4 MG PO CAPS: 0.4000 mg | ORAL_CAPSULE | Freq: Every day | ORAL | Status: DC

## 2024-06-13 MED ORDER — IPRATROPIUM-ALBUTEROL 0.5-2.5 (3) MG/3ML IN SOLN
3.0000 mL | Freq: Four times a day (QID) | RESPIRATORY_TRACT | Status: DC
  Administered 2024-06-13: 3 mL via RESPIRATORY_TRACT
  Filled 2024-06-13: qty 3

## 2024-06-13 MED ORDER — SODIUM CHLORIDE 0.9 % IV SOLN
1.0000 g | Freq: Once | INTRAVENOUS | Status: DC
  Administered 2024-06-13: 1 g via INTRAVENOUS
  Filled 2024-06-13: qty 10

## 2024-06-13 MED ORDER — DILTIAZEM HCL 60 MG PO TABS
120.0000 mg | ORAL_TABLET | Freq: Three times a day (TID) | ORAL | Status: DC
  Administered 2024-06-13: 120 mg via ORAL
  Filled 2024-06-13 (×2): qty 2

## 2024-06-13 MED ORDER — SODIUM CHLORIDE 0.9 % IV BOLUS
1000.0000 mL | Freq: Once | INTRAVENOUS | Status: AC
  Administered 2024-06-13: 1000 mL via INTRAVENOUS

## 2024-06-13 MED ORDER — ALBUTEROL SULFATE (2.5 MG/3ML) 0.083% IN NEBU
2.5000 mg | INHALATION_SOLUTION | Freq: Four times a day (QID) | RESPIRATORY_TRACT | Status: DC
  Administered 2024-06-13 – 2024-06-14 (×2): 2.5 mg via RESPIRATORY_TRACT
  Filled 2024-06-13 (×2): qty 3

## 2024-06-13 MED ORDER — APIXABAN 5 MG PO TABS: 5.0000 mg | ORAL_TABLET | Freq: Two times a day (BID) | ORAL | Status: DC

## 2024-06-13 MED ORDER — FINASTERIDE 5 MG PO TABS
5.0000 mg | ORAL_TABLET | Freq: Every day | ORAL | Status: DC
  Administered 2024-06-13 – 2024-06-21 (×9): 5 mg via ORAL
  Filled 2024-06-13 (×9): qty 1

## 2024-06-13 MED ORDER — TRANEXAMIC ACID-NACL 1000-0.7 MG/100ML-% IV SOLN
1000.0000 mg | Freq: Once | INTRAVENOUS | Status: AC
  Administered 2024-06-13: 1000 mg via INTRAVENOUS
  Filled 2024-06-13: qty 100

## 2024-06-13 MED ORDER — ACETAMINOPHEN 325 MG PO TABS
650.0000 mg | ORAL_TABLET | Freq: Four times a day (QID) | ORAL | Status: DC | PRN
  Administered 2024-06-14 – 2024-06-21 (×12): 650 mg via ORAL
  Filled 2024-06-13 (×12): qty 2

## 2024-06-13 MED ORDER — SODIUM CHLORIDE 0.9 % IV SOLN
2.0000 g | INTRAVENOUS | Status: DC
  Administered 2024-06-14 – 2024-06-20 (×7): 2 g via INTRAVENOUS
  Filled 2024-06-13 (×7): qty 20

## 2024-06-13 MED ORDER — LACTATED RINGERS IV BOLUS
500.0000 mL | Freq: Once | INTRAVENOUS | Status: AC
  Administered 2024-06-13: 500 mL via INTRAVENOUS

## 2024-06-13 MED ORDER — ALBUTEROL SULFATE (2.5 MG/3ML) 0.083% IN NEBU
2.5000 mg | INHALATION_SOLUTION | RESPIRATORY_TRACT | Status: DC | PRN
  Administered 2024-06-18: 13:00:00 2.5 mg via RESPIRATORY_TRACT
  Filled 2024-06-13: qty 3

## 2024-06-13 MED ORDER — METHYLPREDNISOLONE SODIUM SUCC 125 MG IJ SOLR
125.0000 mg | Freq: Once | INTRAMUSCULAR | Status: AC
  Administered 2024-06-13: 125 mg via INTRAVENOUS
  Filled 2024-06-13: qty 2

## 2024-06-13 MED ORDER — SODIUM CHLORIDE 0.9 % IV SOLN
500.0000 mg | Freq: Once | INTRAVENOUS | Status: AC
  Administered 2024-06-13: 500 mg via INTRAVENOUS
  Filled 2024-06-13: qty 5

## 2024-06-13 MED ORDER — LIDOCAINE HCL URETHRAL/MUCOSAL 2 % EX GEL
1.0000 | Freq: Once | CUTANEOUS | Status: AC
  Administered 2024-06-13: 1 via TOPICAL
  Filled 2024-06-13: qty 11

## 2024-06-13 MED ORDER — SODIUM CHLORIDE 0.9% FLUSH
3.0000 mL | Freq: Two times a day (BID) | INTRAVENOUS | Status: DC
  Administered 2024-06-13 – 2024-06-21 (×17): 3 mL via INTRAVENOUS

## 2024-06-13 MED ORDER — DILTIAZEM HCL 25 MG/5ML IV SOLN
10.0000 mg | Freq: Once | INTRAVENOUS | Status: AC
  Administered 2024-06-13: 10 mg via INTRAVENOUS
  Filled 2024-06-13: qty 5

## 2024-06-13 MED ORDER — CHLORHEXIDINE GLUCONATE CLOTH 2 % EX PADS
6.0000 | MEDICATED_PAD | Freq: Every day | CUTANEOUS | Status: DC
  Administered 2024-06-14 – 2024-06-20 (×7): 6 via TOPICAL

## 2024-06-13 MED ORDER — ACETAMINOPHEN 650 MG RE SUPP: 650.0000 mg | Freq: Four times a day (QID) | RECTAL | Status: DC | PRN

## 2024-06-13 MED ORDER — SODIUM CHLORIDE 0.9 % IR SOLN
3000.0000 mL | Status: DC
  Administered 2024-06-13: 3000 mL

## 2024-06-13 NOTE — H&P (Signed)
 History and Physical    Patient: Steven Barnett FMW:[id number] DOB: 1951/03/19 DOA: 06/13/2024 DOS: the patient was seen and examined on 06/13/2024 PCP: Jason Leita Repine, FNP (Inactive)  Patient coming from: Home via EMS  Chief Complaint:  Chief Complaint  Patient presents with   Shortness of Breath   HPI: Steven Barnett is a 73 y.o. male with medical history significant of hypertension, diastolic congestive heart failure atrial fibrillation on chronic anticoagulation, history of intracranial hemorrhage, CKD, morbid obesity presents with hematuria and abdominal pain following a recent prostate biopsy.  He underwent a prostate biopsy last Friday by Dr. Carolynn at [facility] urology. Following the procedure, he experienced blood, described as 'strawberry pee,' which persisted for a couple of days. By Sunday, the hematuria had subsided but returned with bowel movements. This morning, he noticed 'straight blood' dripping when attempting to urinate, which worsened over time. He also experienced difficulty urinating, and when he was able to, it appeared as if he was 'pissing out pure blood.' Currently patient is reporting having chills.  Prior to the procedure he was supposed to have antibiotics that family state were never sent in and then postop he was supposed to be prescribed antibiotics that they report were never sent in either.  He reports lower abdominal pain and describes episodes of feeling like he is about to pass out. No recent exposure to anyone sick or ill.  He reports having a persistent productive cough over the last month with some wheezing.  Reportedly had been using home inhaler without improvement.  He was seen in urgent care on 12/1 and sent to the hospital for further evaluation.  He checked for influenza, COVID-19, and RSV which were negative and ultimately sent home without any.    He quit smoking 20 years ago and has a past medical history of having a procedure to drain  his lungs 20 years ago.  In route with EMS saturations noted with was 80% on room air patient was placed on nonrebreather O2 given 250 mL of lactated Ringer 's in  route.  In the emergency department patient was noted to be afebrile with pulse 60- 140s and atrial fibrillation, systolic blood pressure elevated up to 170s, and O2 saturation maintained on 3-4 L of nasal cannula oxygen.  Labs significant for WBC 11.2, hemoglobin 16.6, BUN 16, creatinine 1.39, BNP 96.2, high-sensitivity troponin 6, and lactic acid 2.3.  Chest x-ray noted no acute abnormality.  Foley catheter was placed while patient was in the ED with output of 800 mL of urine.    Patient was given morphine , Zofran , Rocephin , azithromycin , 500 mL of normal saline IV fluids.   Review of Systems: As mentioned in the history of present illness. All other systems reviewed and are negative. Past Medical History:  Diagnosis Date   Anemia    Arthralgia of both knees    Arthritis    right knee (10/01/2015)   Atrial fibrillation (HCC)    Cancer (HCC)    STAGE 1 COLON CANCER   Chronic anticoagulation 2010   Coumadin    Chronic diastolic CHF (congestive heart failure), NYHA class 2 (HCC)    Dyspnea    Dysrhythmia    Hypertension    OSA (obstructive sleep apnea) 2016   couldn't take the mask during the testing (10/01/2015)   Permanent atrial fibrillation (HCC) 2010   Pneumonia 3/29/2017and june 2017   Past Surgical History:  Procedure Laterality Date   COLONOSCOPY WITH PROPOFOL  N/A 03/04/2016   Procedure: COLONOSCOPY  WITH PROPOFOL ;  Surgeon: Belvie Just, MD;  Location: [facility];  Service: Endoscopy;  Laterality: N/A;   COLONOSCOPY WITH PROPOFOL  N/A 09/15/2017   Procedure: COLONOSCOPY WITH PROPOFOL ;  Surgeon: Just Belvie, MD;  Location: [facility];  Service: Endoscopy;  Laterality: N/A;   COLONOSCOPY WITH PROPOFOL  N/A 02/12/2021   Procedure: COLONOSCOPY WITH PROPOFOL ;  Surgeon: Just Belvie, MD;  Location: [facility];   Service: Endoscopy;  Laterality: N/A;   LAPAROSCOPIC PARTIAL COLECTOMY N/A 04/12/2016   Procedure: LAPAROSCOPIC ILEOCOLECTOMY;  Surgeon: Jina Nephew, MD;  Location: [facility] OR;  Service: General;  Laterality: N/A;   PORT-A-CATH REMOVAL N/A 02/16/2017   Procedure: REMOVAL PORT-A-CATH;  Surgeon: Nephew Jina, MD;  Location: [facility] OR;  Service: General;  Laterality: N/A;   PORTACATH PLACEMENT N/A 05/18/2016   Procedure: INSERTION PORT-A-CATH;  Surgeon: Jina Nephew, MD;  Location: [facility];  Service: General;  Laterality: N/A;   THORACOTOMY Right 2010   Social History:  reports that he quit smoking about 49 years ago. His smoking use included cigarettes. He started smoking about 51 years ago. He has a 0.2 pack-year smoking history. He has never used smokeless tobacco. He reports that he does not drink alcohol and does not use drugs.  Allergies[1]  Family History  Problem Relation Age of Onset   Heart disease Father    Heart failure Father    Colon polyps Father        unspecified number - pt of intestine surgically resected   Hypertension Mother    Diabetes Mother    Hyperlipidemia Mother    Colon polyps Mother        unspecified number - pt of intestine surgically resected   Dementia Mother        d. 40   Stroke Maternal Grandmother    Stroke Sister    Colon cancer Sister 84       w/ cancerous polyps - unspecified number; underwent surgery and radiation   Colon polyps Brother        unspecified number - polypectomies   Colon cancer Sister        dx 25-60; s/p surgery and radiation   Bone cancer Maternal Uncle        dx. 60s   Lung cancer Paternal Grandfather        d. 80s-90s; lung cancer vs TB   Colon cancer Sister        dx. 82-60; s/p surgery and radiation   Cancer Maternal Uncle        mother had about 7-8 other siblings, most passed at older ages, many of whom had some type of cancer   Heart attack Neg Hx     Prior to Admission medications  Medication Sig  Start Date End Date Taking? Authorizing Provider  acetaminophen  (TYLENOL ) 500 MG tablet Take 1 tablet (500 mg total) by mouth every 6 (six) hours as needed. Take 1 tablet 500 mg 3 times daily x5 days, then every 6 hours as needed. 10/18/21   Sebastian Toribio GAILS, MD  albuterol  (VENTOLIN  HFA) 108 [phone] Base) MCG/ACT inhaler Inhale 2 puffs into the lungs every 6 (six) hours as needed for wheezing or shortness of breath. 06/21/22   Jason Leita Repine, FNP  allopurinol  (ZYLOPRIM ) 100 MG tablet Take 1 tablet (100 mg total) by mouth daily. 01/01/24   Jason Leita Repine, FNP  apixaban  (ELIQUIS ) 5 MG TABS tablet TAKE 1 TABLET EVERY MORNING AND TAKE 1 TABLET AT BEDTIME 04/24/24   Hilty, Vinie  C, MD  apixaban  (ELIQUIS ) 5 MG TABS tablet Take 1 tablet (5 mg total) by mouth 2 (two) times daily. 06/03/24   Laurice Maude BROCKS, MD  aspirin  EC 81 MG tablet Take 81 mg by mouth every morning.    [provider]  colchicine  0.6 MG tablet Take 1 tablet (0.6 mg total) by mouth daily. 12/05/23   Jason Leita Repine, FNP  diltiazem  (CARDIZEM ) 120 MG tablet Take 1 tablet (120 mg total) by mouth 3 (three) times daily. Patient taking differently: Take 120 mg by mouth in the morning and at bedtime. 12/05/23   Jason Leita Repine, FNP  empagliflozin  (JARDIANCE ) 10 MG TABS tablet Take 1 tablet (10 mg total) by mouth daily before breakfast. 12/12/23   Walker, Caitlin S, NP  furosemide  (LASIX ) 40 MG tablet TAKE 1 TABLET EVERY DAY (NEED MD APPOINTMENT) 02/26/24   Mona Vinie BROCKS, MD  lisinopril  (ZESTRIL ) 5 MG tablet Take 1 tablet (5 mg total) by mouth daily. 12/29/23   Jason Leita Repine, FNP  MAGNESIUM -OXIDE 400 (240 Mg) MG tablet Take 1 tablet by mouth 2 (two) times daily. 02/05/22   [provider]  pantoprazole  (PROTONIX ) 40 MG tablet Take 1 tablet by mouth once daily 09/12/22   Jason Leita Repine, FNP  potassium chloride  (KLOR-CON ) 10 MEQ tablet Take 1 tablet (10 mEq total) by mouth daily. 12/05/23    Jason Leita Repine, FNP    Physical Exam: Vitals:   06/13/24 1430 06/13/24 1445 06/13/24 1500 06/13/24 1515  BP:      Pulse: 87 83 82 93  Resp: 14 (!) 23 16 17   SpO2: 100% 100% 94% 96%  Height:        Constitutional: Elderly obese male who appears acutely ill Eyes: PERRL, lids and conjunctivae normal ENMT: Mucous membranes are moist. Posterior pharynx clear of any exudate or lesions.Normal dentition.  Neck: normal, supple  Respiratory: clear to auscultation bilaterally, no wheezing, no crackles. Normal respiratory effort. No accessory muscle use.  Cardiovascular: Tachycardic with irregular irregular rhythm. Abdomen: Distended abdomen with some suprapubic tenderness to palpation.  Bowel sounds positive.  Musculoskeletal: no clubbing / cyanosis. No joint deformity upper and lower extremities. Good ROM, no contractures. Normal muscle tone.  Skin: no rashes, lesions, ulcers. No induration Neurologic: CN 2-12 grossly intact.  Able to move all extremity Psychiatric: Patient seems altered staring off times, but appeared to answer questions appropriately.  Data Reviewed:  EKG revealed atrial fibrillation at 93 bpm.  Reviewed labs, imaging, and pertinent records as document  Assessment and Plan:  Acute respiratory failure with hypoxia Chronic bronchitis Patient noted to be hypoxic down into the 60s patient was placed on supplemental oxygen to maintain O2 saturations.  Normally patient not on oxygen at baseline.  Reportedly has been having a cough over the last month using home inhaler without improvement.  On physical exam noted to have wheezing throughout both lung fields.  Chest x-ray negative for any acute abnormality.  Question of possibility of underlying infection. - Admit to a progressive bed - Continuous pulse oximetry with oxygen maintain O2 saturation percent 90% - Check complete respiratory virus panel - Check procalcitonin and venous blood gas - Albuterol  neb 4 times  daily and albuterol  nebs as needed - Solu-Medrol  IV x 1 dose - Continue Rocephin  IV  Hematuria Suspected urinary tract infection Acute kidney injury secondary to urinary retention Patient was noted to be acutely retaining urine after recent prostate biopsy with reports of hematuria.  Patient reported not receiving  antibiotics preop or postop.  Creatinine noted to be elevated at 1.39 with BUN 16 baseline creatinine previously noted to be 1.09.  Foley catheter was placed with 800 mL of bloody urine output. Subsequently Foley catheter, clotted off for which unable to get it unclogged. - Follow-up urinalysis and urine culture - Continue Foley - Avoid nephrotoxic agents - Continue Rocephin  IV as noted above - Urology consulted, will follow-up for any further recommendations  SIRS Leukocytosis Lactic acidosis Acute.  WBC elevated at 11.2 with tachycardia and tachypnea meeting SIRS criteria.  Initial lactic acid 2.3 with repeat check 5.5.  Blood cultures were obtained.  Question possibility of sepsis secondary to urinary infection. - Follow-up blood and urine cultures - Antibiotics as noted above - Continue to trend lactic acid - Bolus 1 liter of IVF  Acute metabolic encephalopathy Appears little confused at time, but able to follow commands.  Question possibility of underlying infection as cause of patient's symptoms.  Note patient does have a history of traumatic intracranial hemorrhage and seizures - Neurochecks - Check CT scan of the head stat - Check venous blood gas  Atrial fibrillation on chronic anticoagulation Patient noted to be in atrial fibrillation with heart rates elevated into the 140s.  Patient likely missed scheduled Cardizem  doses.CHA2DS2-VASc score equal to 3. - Continue Cardizem  - Goal potassium 4 magnesium  at least 2 - Hold Eliquis .  Resume when able  Diastolic congestive heart failure Chronic.  BNP noted to be 96.2.  Patient appears fairly euvolemic.  Last  echocardiogram noted EF to be 60 to 65% with grade 1 diastolic dysfunction when checked 02/2024. - Strict I&Os and daily weights - Holding diuretics at this time  Essential hypertension Blood pressures are currently maintained - Continue Cardizem   Gout No signs of acute flare - Continue allopurinol   History of intracranial hemorrhage Patient suffered a nontraumatic intracranial hemorrhage back in 2023.  At that time he had been on Coumadin .  Currently on Eliquis .  Morbid obesity OSA - Monitor for need of CPAP at night  DVT prophylaxis: SCDs Advance Care Planning:   Code Status: Full Code   Consults: Urology  Family Communication: Daughter updated over the phone  Severity of Illness: The appropriate patient status for this patient is OBSERVATION. Observation status is judged to be reasonable and necessary in order to provide the required intensity of service to ensure the patient's safety. The patient's presenting symptoms, physical exam findings, and initial radiographic and laboratory data in the context of their medical condition is felt to place them at decreased risk for further clinical deterioration. Furthermore, it is anticipated that the patient will be medically stable for discharge from the hospital within 2 midnights of admission.   Author: Maximino DELENA Sharps, MD 06/13/2024 2:55 PM  For on call review [email or web address].     [1] No Known Allergies

## 2024-06-13 NOTE — ED Provider Notes (Signed)
 [place] EMERGENCY DEPARTMENT AT [facility] Provider Note   CSN: [id number] Arrival date & time: 06/13/24  1115     Patient presents with: Shortness of Breath   Steven Barnett is a 73 y.o. male.   Pt is a 73y/o male with hx of A-fib on anticoagulation, CHF, CAD who is presenting today with EMS for multiple complaints.  Based on prior medical review patient has had an ongoing cough for weeks now but reports in the last 3 to 4 days he has developed a productive cough.  He had a biopsy of his bladder earlier in the week and was having some pink urine but today he reports that he had been constipated so he went to have a bowel movement and did strain but was able to have a bowel movement.  However after having the bowel movement he has had significant amounts of bleeding from his penis.  He now reports that he is unable to urinate and is having pain in his lower abdomen.  He is also feeling short of breath.  He reports that he has been diaphoretic and feeling chilled but felt that it mostly started today.  He last took a dose of Eliquis  this morning and has been compliant with his medications.  He has not noticed significant new swelling in his lower extremities.  Today is the first day the bleeding has been this heavy and after it started he started feeling generally weak.  He denies any chest pain.  EMS reported when they initially arrived patient's sats were 83% on room air and he received a bolus of fluid and a nonrebreather.  Since that time sats have been normal and patient was weaned off the nonrebreather and sats of been okay.  The history is provided by the patient, the EMS personnel and medical records.  Shortness of Breath      Prior to Admission medications  Medication Sig Start Date End Date Taking? Authorizing Provider  acetaminophen  (TYLENOL ) 500 MG tablet Take 1 tablet (500 mg total) by mouth every 6 (six) hours as needed. Take 1 tablet 500 mg 3 times daily x5 days,  then every 6 hours as needed. 10/18/21   Sebastian Toribio GAILS, MD  albuterol  (VENTOLIN  HFA) 108 [phone] Base) MCG/ACT inhaler Inhale 2 puffs into the lungs every 6 (six) hours as needed for wheezing or shortness of breath. 06/21/22   Jason Leita Repine, FNP  allopurinol  (ZYLOPRIM ) 100 MG tablet Take 1 tablet (100 mg total) by mouth daily. 01/01/24   Jason Leita Repine, FNP  apixaban  (ELIQUIS ) 5 MG TABS tablet TAKE 1 TABLET EVERY MORNING AND TAKE 1 TABLET AT BEDTIME 04/24/24   Hilty, Vinie BROCKS, MD  apixaban  (ELIQUIS ) 5 MG TABS tablet Take 1 tablet (5 mg total) by mouth 2 (two) times daily. 06/03/24   Laurice Maude BROCKS, MD  aspirin  EC 81 MG tablet Take 81 mg by mouth every morning.    [provider]  colchicine  0.6 MG tablet Take 1 tablet (0.6 mg total) by mouth daily. 12/05/23   Jason Leita Repine, FNP  diltiazem  (CARDIZEM ) 120 MG tablet Take 1 tablet (120 mg total) by mouth 3 (three) times daily. Patient taking differently: Take 120 mg by mouth in the morning and at bedtime. 12/05/23   Jason Leita Repine, FNP  empagliflozin  (JARDIANCE ) 10 MG TABS tablet Take 1 tablet (10 mg total) by mouth daily before breakfast. 12/12/23   Walker, Caitlin S, NP  furosemide  (LASIX ) 40 MG tablet  TAKE 1 TABLET EVERY DAY (NEED MD APPOINTMENT) 02/26/24   Mona Vinie BROCKS, MD  lisinopril  (ZESTRIL ) 5 MG tablet Take 1 tablet (5 mg total) by mouth daily. 12/29/23   Jason Leita Repine, FNP  MAGNESIUM -OXIDE 400 (240 Mg) MG tablet Take 1 tablet by mouth 2 (two) times daily. 02/05/22   [provider]  pantoprazole  (PROTONIX ) 40 MG tablet Take 1 tablet by mouth once daily 09/12/22   Jason Leita Repine, FNP  potassium chloride  (KLOR-CON ) 10 MEQ tablet Take 1 tablet (10 mEq total) by mouth daily. 12/05/23   Jason Leita Repine, FNP    Allergies: Patient has no known allergies.    Review of Systems  Respiratory:  Positive for shortness of breath.     Updated Vital Signs Ht 5' 10 (1.778 m)   SpO2  100%   BMI 37.31 kg/m   Physical Exam Vitals and nursing note reviewed.  Constitutional:      General: He is in acute distress.     Appearance: He is well-developed. He is ill-appearing and diaphoretic.  HENT:     Head: Normocephalic and atraumatic.  Eyes:     Conjunctiva/sclera: Conjunctivae normal.     Pupils: Pupils are equal, round, and reactive to light.  Cardiovascular:     Rate and Rhythm: Normal rate. Rhythm irregularly irregular.     Pulses: Normal pulses.     Heart sounds: No murmur heard. Pulmonary:     Effort: Pulmonary effort is normal. Tachypnea present. No respiratory distress.     Breath sounds: Examination of the right-middle field reveals rhonchi. Examination of the right-lower field reveals rhonchi. Rhonchi present. No wheezing or rales.  Abdominal:     General: There is no distension.     Palpations: Abdomen is soft.     Tenderness: There is abdominal tenderness in the suprapubic area. There is no guarding or rebound.  Genitourinary:    Comments: Blood oozing from the meatus with some mild clot present Musculoskeletal:        General: No tenderness. Normal range of motion.     Cervical back: Normal range of motion and neck supple.     Comments: No significant edema noted in the lower extremities  Skin:    General: Skin is warm.     Coloration: Skin is pale.     Findings: No erythema or rash.  Neurological:     Mental Status: He is alert and oriented to person, place, and time. Mental status is at baseline.  Psychiatric:        Behavior: Behavior normal.     (all labs ordered are listed, but only abnormal results are displayed) Labs Reviewed  COMPREHENSIVE METABOLIC PANEL WITH GFR - Abnormal; Notable for the following components:      Result Value   Glucose, Bld 131 (*)    Creatinine, Ser 1.39 (*)    Calcium  8.5 (*)    Albumin 3.3 (*)    Total Bilirubin 2.3 (*)    GFR, Estimated 54 (*)    All other components within normal limits  CBC WITH  DIFFERENTIAL/PLATELET - Abnormal; Notable for the following components:   WBC 11.2 (*)    RBC 6.01 (*)    Neutro Abs 8.7 (*)    Monocytes Absolute 1.3 (*)    All other components within normal limits  PROTIME-INR - Abnormal; Notable for the following components:   Prothrombin  Time 16.7 (*)    INR 1.3 (*)    All other components within  normal limits  I-STAT CG4 LACTIC ACID, ED - Abnormal; Notable for the following components:   Lactic Acid, Venous 2.3 (*)    All other components within normal limits  CULTURE, BLOOD (ROUTINE X 2)  CULTURE, BLOOD (ROUTINE X 2)  BRAIN NATRIURETIC PEPTIDE  URINALYSIS, W/ REFLEX TO CULTURE (INFECTION SUSPECTED)  I-STAT CG4 LACTIC ACID, ED  I-STAT CG4 LACTIC ACID, ED  I-STAT CG4 LACTIC ACID, ED  TROPONIN I (HIGH SENSITIVITY)  TROPONIN I (HIGH SENSITIVITY)    EKG: EKG Interpretation Date/Time:  Thursday June 13 2024 14:12:42 EST Ventricular Rate:  93 PR Interval:    QRS Duration:  94 QT Interval:  381 QTC Calculation: 474 R Axis:   29  Text Interpretation: Atrial fibrillation No significant change since last tracing Confirmed by Doretha Folks ([id number]) on 06/13/2024 2:42:50 PM  Radiology: DG Chest Portable 1 View Result Date: 06/13/2024 CLINICAL DATA:  Shortness of breath. EXAM: PORTABLE CHEST 1 VIEW COMPARISON:  06/03/2024. FINDINGS: Stable cardiomegaly. Similar retrocardiac densities on the right, may relate to dilated esophagus. No overt pulmonary edema, focal consolidation, pleural effusion, or pneumothorax. No acute osseous abnormality. IMPRESSION: No acute cardiopulmonary findings. Electronically Signed   By: Harrietta Sherry M.D.   On: 06/13/2024 12:27     Procedures   Medications Ordered in the ED  cefTRIAXone  (ROCEPHIN ) 1 g in sodium chloride  0.9 % 100 mL IVPB (has no administration in time range)  azithromycin  (ZITHROMAX ) 500 mg in sodium chloride  0.9 % 250 mL IVPB (500 mg Intravenous New Bag/Given 06/13/24 1411)  morphine  (PF)  4 MG/ML injection 4 mg (4 mg Intravenous Given 06/13/24 1209)  ondansetron  (ZOFRAN ) injection 4 mg (4 mg Intravenous Given 06/13/24 1209)  lidocaine  (XYLOCAINE ) 2 % jelly 1 Application (1 Application Topical Given 06/13/24 1209)  lactated ringers  bolus 500 mL (500 mLs Intravenous New Bag/Given 06/13/24 1410)                                    Medical Decision Making Amount and/or Complexity of Data Reviewed Independent Historian: EMS External Data Reviewed: notes. Labs: ordered. Decision-making details documented in ED Course. Radiology: ordered and independent interpretation performed. Decision-making details documented in ED Course. ECG/medicine tests: ordered and independent interpretation performed. Decision-making details documented in ED Course.  Risk Prescription drug management.   Pt with multiple medical problems and comorbidities and presenting today with a complaint that caries a high risk for morbidity and mortality.  Here today with the above complaints.  Concern for sepsis as a result of pneumonia versus symptomatic anemia from new acute bladder hemorrhage that started today after a biopsy done earlier in the week.  Patient is mentating normally.  Blood pressure is stable at this time.  Undifferentiated sepsis was initiated.  Also concern for possible CHF however feel that is less likely, pneumothorax or PE.  Patient is displaying signs concerning for urinary retention and a Foley catheter will be placed.  Patient was given pain medicine due to significant discomfort. I independently interpreted patient's labs and EKG. EKG with atrial fibrillation which is unchanged from prior, CBC with mild leukocytosis of 11 with a stable hemoglobin of 16, CMP with mild AKI today with creatinine of 1.39 from his baseline of 1.1 with normal LFTs but a total bilirubin of 2.3.  Lactate elevated at 2.3, troponin and BNP within normal limits.  I have independently visualized and interpreted pt's  images today. Chest x-ray  without acute findings.  Foley catheter was placed with drainage of 800 mL of bloody urine with some clot present.  Overall patient is improved after resolution of urinary retention.  However he is still generally weak and unable to sit up in bed.  Concern for infection given patient's lactate and upper respiratory symptoms as well as recent surgery and possible urinary cause however still waiting on urine results.  Cultures were done and patient was given IV fluids.  He remains hemodynamically stable.  He was given Rocephin  and azithromycin  which would cover respiratory and urinary causes.  Speaking with his daughter he has also had significant coughing recently and wheezing and patient's inhalers are not working.  Wheezing has improved as his pain has improved here but will give a dose of steroids as his daughter reports that usually what happens and helps his COPD.  At this time no signs of CHF.  Patient is not requiring additional O2 at this time.  Discussed the findings with the patient and his daughter.  Will admit for further care.  Will consult hospitalist for admission.  Patient and his daughter are comfortable with this plan.      Final diagnoses:  None    ED Discharge Orders     None          Doretha Folks, MD 06/13/24 1446

## 2024-06-13 NOTE — H&P (View-Only) (Signed)
 Consultation: Gross hematuria, urinary retention Requested by: Dr. Benton Shone  History of Present Illness: Steven Barnett is a 73 year old male with a history of BPH and PSA elevation.  He underwent a TRUS prostate biopsy 06/07/2024 with Dr. Devere. He had a 71 g prostate.  He has a history of A-fib on anticoagulation-Eliquis .  He presented to emergency today with painful inability to urinate.  Chest x-ray was benign.  White count 11.2.  Hemoglobin 16.6.  Creatinine 1.39.  He was given Rocephin  1 g.  A Foley was placed and drained about 800 cc of urine and clot.  The catheter stopped draining and was difficult to irrigate and urology consulted.  Past Medical History:  Diagnosis Date   Anemia    Arthralgia of both knees    Arthritis    right knee (10/01/2015)   Atrial fibrillation (HCC)    Cancer (HCC)    STAGE 1 COLON CANCER   Chronic anticoagulation 2010   Coumadin    Chronic diastolic CHF (congestive heart failure), NYHA class 2 (HCC)    Dyspnea    Dysrhythmia    Hypertension    OSA (obstructive sleep apnea) 2016   couldn't take the mask during the testing (10/01/2015)   Permanent atrial fibrillation (HCC) 2010   Pneumonia 3/29/2017and june 2017   Past Surgical History:  Procedure Laterality Date   COLONOSCOPY WITH PROPOFOL  N/A 03/04/2016   Procedure: COLONOSCOPY WITH PROPOFOL ;  Surgeon: Belvie Just, MD;  Location: [facility];  Service: Endoscopy;  Laterality: N/A;   COLONOSCOPY WITH PROPOFOL  N/A 09/15/2017   Procedure: COLONOSCOPY WITH PROPOFOL ;  Surgeon: Just Belvie, MD;  Location: [facility];  Service: Endoscopy;  Laterality: N/A;   COLONOSCOPY WITH PROPOFOL  N/A 02/12/2021   Procedure: COLONOSCOPY WITH PROPOFOL ;  Surgeon: Just Belvie, MD;  Location: [facility];  Service: Endoscopy;  Laterality: N/A;   LAPAROSCOPIC PARTIAL COLECTOMY N/A 04/12/2016   Procedure: LAPAROSCOPIC ILEOCOLECTOMY;  Surgeon: Jina Nephew, MD;  Location: [facility];  Service: General;  Laterality: N/A;    PORT-A-CATH REMOVAL N/A 02/16/2017   Procedure: REMOVAL PORT-A-CATH;  Surgeon: Nephew Jina, MD;  Location: [facility];  Service: General;  Laterality: N/A;   PORTACATH PLACEMENT N/A 05/18/2016   Procedure: INSERTION PORT-A-CATH;  Surgeon: Jina Nephew, MD;  Location: [facility];  Service: General;  Laterality: N/A;   THORACOTOMY Right 2010    Home Medications:  (Not in a hospital admission)  Allergies: Allergies[1]  Family History  Problem Relation Age of Onset   Heart disease Father    Heart failure Father    Colon polyps Father        unspecified number - pt of intestine surgically resected   Hypertension Mother    Diabetes Mother    Hyperlipidemia Mother    Colon polyps Mother        unspecified number - pt of intestine surgically resected   Dementia Mother        d. 6   Stroke Maternal Grandmother    Stroke Sister    Colon cancer Sister 78       w/ cancerous polyps - unspecified number; underwent surgery and radiation   Colon polyps Brother        unspecified number - polypectomies   Colon cancer Sister        dx 55-60; s/p surgery and radiation   Bone cancer Maternal Uncle        dx. 60s   Lung cancer Paternal Grandfather  d. 80s-90s; lung cancer vs TB   Colon cancer Sister        dx. 69-60; s/p surgery and radiation   Cancer Maternal Uncle        mother had about 7-8 other siblings, most passed at older ages, many of whom had some type of cancer   Heart attack Neg Hx    Social History:  reports that he quit smoking about 49 years ago. His smoking use included cigarettes. He started smoking about 51 years ago. He has a 0.2 pack-year smoking history. He has never used smokeless tobacco. He reports that he does not drink alcohol and does not use drugs.  ROS: A complete review of systems was performed.  All systems are negative except for pertinent findings as noted. ROS   Physical Exam:  Vital signs in last 24 hours: Temp:  [97.6 F (36.4  C)] 97.6 F (36.4 C) (12/11 1617) Pulse Rate:  [25-122] 98 (12/11 1930) Resp:  [13-33] 15 (12/11 1930) BP: (134-171)/(81-130) 148/111 (12/11 1930) SpO2:  [66 %-100 %] 96 % (12/11 1930) General:  Alert and oriented, No acute distress HEENT: Normocephalic, atraumatic Cardiovascular: Regular rate and rhythm Lungs: Regular rate and effort Abdomen: Soft, nontender, nondistended, no abdominal masses Back: No CVA tenderness Extremities: No edema Neurologic: Grossly intact GU: 24 French three-way hematuria catheter in place and seated properly at the bladder neck.  The balloon was mobile in the bladder.  Procedure: I placed the Foley on gentle traction and then used a Toomey syringe to irrigate about 200 cc of clot.  This restarted the irrigation and freed any clot.  I then irrigated the urine to light pink and reconnected to gravity drainage.  Laboratory Data:  Results for orders placed or performed during the hospital encounter of 06/13/24 (from the past 24 hours)  Comprehensive metabolic panel     Status: Abnormal   Collection Time: 06/13/24 11:28 AM  Result Value Ref Range   Sodium 136 135 - 145 mmol/L   Potassium 4.1 3.5 - 5.1 mmol/L   Chloride 100 98 - 111 mmol/L   CO2 25 22 - 32 mmol/L   Glucose, Bld 131 (H) 70 - 99 mg/dL   BUN 16 8 - 23 mg/dL   Creatinine, Ser 8.60 (H) 0.61 - 1.24 mg/dL   Calcium  8.5 (L) 8.9 - 10.3 mg/dL   Total Protein 7.4 6.5 - 8.1 g/dL   Albumin 3.3 (L) 3.5 - 5.0 g/dL   AST 27 15 - 41 U/L   ALT 15 0 - 44 U/L   Alkaline Phosphatase 90 38 - 126 U/L   Total Bilirubin 2.3 (H) 0.0 - 1.2 mg/dL   GFR, Estimated 54 (L) >60 mL/min   Anion gap 11 5 - 15  CBC with Differential     Status: Abnormal   Collection Time: 06/13/24 11:28 AM  Result Value Ref Range   WBC 11.2 (H) 4.0 - 10.5 K/uL   RBC 6.01 (H) 4.22 - 5.81 MIL/uL   Hemoglobin 16.6 13.0 - 17.0 g/dL   HCT 48.7 60.9 - 47.9 %   MCV 85.2 80.0 - 100.0 fL   MCH 27.6 26.0 - 34.0 pg   MCHC 32.4 30.0 - 36.0  g/dL   RDW 85.2 88.4 - 84.4 %   Platelets 174 150 - 400 K/uL   nRBC 0.0 0.0 - 0.2 %   Neutrophils Relative % 78 %   Neutro Abs 8.7 (H) 1.7 - 7.7 K/uL   Lymphocytes Relative 9 %  Lymphs Abs 1.0 0.7 - 4.0 K/uL   Monocytes Relative 11 %   Monocytes Absolute 1.3 (H) 0.1 - 1.0 K/uL   Eosinophils Relative 1 %   Eosinophils Absolute 0.1 0.0 - 0.5 K/uL   Basophils Relative 1 %   Basophils Absolute 0.1 0.0 - 0.1 K/uL   Immature Granulocytes 0 %   Abs Immature Granulocytes 0.04 0.00 - 0.07 K/uL  Protime-INR     Status: Abnormal   Collection Time: 06/13/24 11:28 AM  Result Value Ref Range   Prothrombin  Time 16.7 (H) 11.4 - 15.2 seconds   INR 1.3 (H) 0.8 - 1.2  Troponin I (High Sensitivity)     Status: None   Collection Time: 06/13/24 11:28 AM  Result Value Ref Range   Troponin I (High Sensitivity) 6 <18 ng/L  Brain natriuretic peptide     Status: None   Collection Time: 06/13/24 11:28 AM  Result Value Ref Range   B Natriuretic Peptide 96.2 0.0 - 100.0 pg/mL  I-Stat Lactic Acid, ED     Status: Abnormal   Collection Time: 06/13/24 11:44 AM  Result Value Ref Range   Lactic Acid, Venous 2.3 (HH) 0.5 - 1.9 mmol/L   Comment NOTIFIED PHYSICIAN   Urinalysis, w/ Reflex to Culture (Infection Suspected) -Urine, Clean Catch     Status: Abnormal   Collection Time: 06/13/24  4:25 PM  Result Value Ref Range   Specimen Source URINE, CLEAN CATCH    Color, Urine RED (A) YELLOW   APPearance TURBID (A) CLEAR   Specific Gravity, Urine  1.005 - 1.030    TEST NOT REPORTED DUE TO COLOR INTERFERENCE OF URINE PIGMENT   pH  5.0 - 8.0    TEST NOT REPORTED DUE TO COLOR INTERFERENCE OF URINE PIGMENT   Glucose, UA (A) NEGATIVE mg/dL    TEST NOT REPORTED DUE TO COLOR INTERFERENCE OF URINE PIGMENT   Hgb urine dipstick (A) NEGATIVE    TEST NOT REPORTED DUE TO COLOR INTERFERENCE OF URINE PIGMENT   Bilirubin Urine (A) NEGATIVE    TEST NOT REPORTED DUE TO COLOR INTERFERENCE OF URINE PIGMENT   Ketones, ur NEGATIVE  NEGATIVE mg/dL   Protein, ur (A) NEGATIVE mg/dL    TEST NOT REPORTED DUE TO COLOR INTERFERENCE OF URINE PIGMENT   Nitrite (A) NEGATIVE    TEST NOT REPORTED DUE TO COLOR INTERFERENCE OF URINE PIGMENT   Leukocytes,Ua (A) NEGATIVE    TEST NOT REPORTED DUE TO COLOR INTERFERENCE OF URINE PIGMENT   Squamous Epithelial / HPF NONE SEEN 0 - 5 /HPF   WBC, UA 0-5 0 - 5 WBC/hpf   RBC / HPF >50 0 - 5 RBC/hpf   Bacteria, UA MANY (A) NONE SEEN  Troponin I (High Sensitivity)     Status: None   Collection Time: 06/13/24  4:35 PM  Result Value Ref Range   Troponin I (High Sensitivity) 5 <18 ng/L  I-Stat Lactic Acid, ED     Status: Abnormal   Collection Time: 06/13/24  4:43 PM  Result Value Ref Range   Lactic Acid, Venous 5.5 (HH) 0.5 - 1.9 mmol/L   Comment NOTIFIED PHYSICIAN   I-Stat Lactic Acid, ED     Status: Abnormal   Collection Time: 06/13/24  5:44 PM  Result Value Ref Range   Lactic Acid, Venous 3.0 (HH) 0.5 - 1.9 mmol/L   Comment NOTIFIED PHYSICIAN    No results found for this or any previous visit (from the past 240 hours). Creatinine: Recent Labs  06/13/24 1128  CREATININE 1.39*    Impression/Assessment:  BPH, gross hematuria, elevated PSA status post biopsy-  Plan:  -Hold anticoagulation if medically feasible. -I will give 1 g of TXA IV -Continue CBI.  Suspect bleeding will improve. -Will start tamsulosin and finasteride which will help his bleeding and optimize chances for voiding trial success -Urology to follow  Donnice Brooks 06/13/2024, 7:50 PM      [1] No Known Allergies

## 2024-06-13 NOTE — ED Notes (Signed)
 Rn emptied total of from catheter and replaced 2 3l bags of fluid

## 2024-06-13 NOTE — ED Triage Notes (Addendum)
 Pt BIB EMS from home. Had bladder biopsy done on Saturday and has had mild bleeding since. Today bleeding was more and pt was saturating towel. Today pt became weak and SOB. hr 110 bp 150/70 SPO2 80%ra 100% nrB. lr given RR 40. PT IS ON ELIQUIS 

## 2024-06-13 NOTE — ED Notes (Signed)
 Pt placed on 2l oxygen [place] due to spo2 of 83

## 2024-06-13 NOTE — Consult Note (Addendum)
 Consultation: Gross hematuria, urinary retention Requested by: Dr. Benton Shone  History of Present Illness: [name] is a 73 year old male with a history of BPH and PSA elevation.  He underwent a TRUS prostate biopsy 06/07/2024 with Dr. Devere. He had a 71 g prostate.  He has a history of A-fib on anticoagulation-Eliquis .  He presented to emergency today with painful inability to urinate.  Chest x-ray was benign.  White count 11.2.  Hemoglobin 16.6.  Creatinine 1.39.  He was given Rocephin  1 g.  A Foley was placed and drained about 800 cc of urine and clot.  The catheter stopped draining and was difficult to irrigate and urology consulted.  Past Medical History:  Diagnosis Date   Anemia    Arthralgia of both knees    Arthritis    right knee (10/01/2015)   Atrial fibrillation (HCC)    Cancer (HCC)    STAGE 1 COLON CANCER   Chronic anticoagulation 2010   Coumadin    Chronic diastolic CHF (congestive heart failure), NYHA class 2 (HCC)    Dyspnea    Dysrhythmia    Hypertension    OSA (obstructive sleep apnea) 2016   couldn't take the mask during the testing (10/01/2015)   Permanent atrial fibrillation (HCC) 2010   Pneumonia 3/29/2017and june 2017   Past Surgical History:  Procedure Laterality Date   COLONOSCOPY WITH PROPOFOL  N/A 03/04/2016   Procedure: COLONOSCOPY WITH PROPOFOL ;  Surgeon: Belvie Just, MD;  Location: [facility];  Service: Endoscopy;  Laterality: N/A;   COLONOSCOPY WITH PROPOFOL  N/A 09/15/2017   Procedure: COLONOSCOPY WITH PROPOFOL ;  Surgeon: Just Belvie, MD;  Location: [facility];  Service: Endoscopy;  Laterality: N/A;   COLONOSCOPY WITH PROPOFOL  N/A 02/12/2021   Procedure: COLONOSCOPY WITH PROPOFOL ;  Surgeon: Just Belvie, MD;  Location: [facility];  Service: Endoscopy;  Laterality: N/A;   LAPAROSCOPIC PARTIAL COLECTOMY N/A 04/12/2016   Procedure: LAPAROSCOPIC ILEOCOLECTOMY;  Surgeon: Jina Nephew, MD;  Location: [facility];  Service: General;  Laterality: N/A;    PORT-A-CATH REMOVAL N/A 02/16/2017   Procedure: REMOVAL PORT-A-CATH;  Surgeon: Nephew Jina, MD;  Location: [facility];  Service: General;  Laterality: N/A;   PORTACATH PLACEMENT N/A 05/18/2016   Procedure: INSERTION PORT-A-CATH;  Surgeon: Jina Nephew, MD;  Location: [facility];  Service: General;  Laterality: N/A;   THORACOTOMY Right 2010    Home Medications:  (Not in a hospital admission)  Allergies: Allergies[1]  Family History  Problem Relation Age of Onset   Heart disease Father    Heart failure Father    Colon polyps Father        unspecified number - pt of intestine surgically resected   Hypertension Mother    Diabetes Mother    Hyperlipidemia Mother    Colon polyps Mother        unspecified number - pt of intestine surgically resected   Dementia Mother        d. 6   Stroke Maternal Grandmother    Stroke Sister    Colon cancer Sister 78       w/ cancerous polyps - unspecified number; underwent surgery and radiation   Colon polyps Brother        unspecified number - polypectomies   Colon cancer Sister        dx 55-60; s/p surgery and radiation   Bone cancer Maternal Uncle        dx. 60s   Lung cancer Paternal Grandfather  d. 80s-90s; lung cancer vs TB   Colon cancer Sister        dx. 69-60; s/p surgery and radiation   Cancer Maternal Uncle        mother had about 7-8 other siblings, most passed at older ages, many of whom had some type of cancer   Heart attack Neg Hx    Social History:  reports that he quit smoking about 49 years ago. His smoking use included cigarettes. He started smoking about 51 years ago. He has a 0.2 pack-year smoking history. He has never used smokeless tobacco. He reports that he does not drink alcohol and does not use drugs.  ROS: A complete review of systems was performed.  All systems are negative except for pertinent findings as noted. ROS   Physical Exam:  Vital signs in last 24 hours: Temp:  [97.6 F (36.4  C)] 97.6 F (36.4 C) (12/11 1617) Pulse Rate:  [25-122] 98 (12/11 1930) Resp:  [13-33] 15 (12/11 1930) BP: (134-171)/(81-130) 148/111 (12/11 1930) SpO2:  [66 %-100 %] 96 % (12/11 1930) General:  Alert and oriented, No acute distress HEENT: Normocephalic, atraumatic Cardiovascular: Regular rate and rhythm Lungs: Regular rate and effort Abdomen: Soft, nontender, nondistended, no abdominal masses Back: No CVA tenderness Extremities: No edema Neurologic: Grossly intact GU: 24 French three-way hematuria catheter in place and seated properly at the bladder neck.  The balloon was mobile in the bladder.  Procedure: I placed the Foley on gentle traction and then used a Toomey syringe to irrigate about 200 cc of clot.  This restarted the irrigation and freed any clot.  I then irrigated the urine to light pink and reconnected to gravity drainage.  Laboratory Data:  Results for orders placed or performed during the hospital encounter of 06/13/24 (from the past 24 hours)  Comprehensive metabolic panel     Status: Abnormal   Collection Time: 06/13/24 11:28 AM  Result Value Ref Range   Sodium 136 135 - 145 mmol/L   Potassium 4.1 3.5 - 5.1 mmol/L   Chloride 100 98 - 111 mmol/L   CO2 25 22 - 32 mmol/L   Glucose, Bld 131 (H) 70 - 99 mg/dL   BUN 16 8 - 23 mg/dL   Creatinine, Ser 8.60 (H) 0.61 - 1.24 mg/dL   Calcium  8.5 (L) 8.9 - 10.3 mg/dL   Total Protein 7.4 6.5 - 8.1 g/dL   Albumin 3.3 (L) 3.5 - 5.0 g/dL   AST 27 15 - 41 U/L   ALT 15 0 - 44 U/L   Alkaline Phosphatase 90 38 - 126 U/L   Total Bilirubin 2.3 (H) 0.0 - 1.2 mg/dL   GFR, Estimated 54 (L) >60 mL/min   Anion gap 11 5 - 15  CBC with Differential     Status: Abnormal   Collection Time: 06/13/24 11:28 AM  Result Value Ref Range   WBC 11.2 (H) 4.0 - 10.5 K/uL   RBC 6.01 (H) 4.22 - 5.81 MIL/uL   Hemoglobin 16.6 13.0 - 17.0 g/dL   HCT 48.7 60.9 - 47.9 %   MCV 85.2 80.0 - 100.0 fL   MCH 27.6 26.0 - 34.0 pg   MCHC 32.4 30.0 - 36.0  g/dL   RDW 85.2 88.4 - 84.4 %   Platelets 174 150 - 400 K/uL   nRBC 0.0 0.0 - 0.2 %   Neutrophils Relative % 78 %   Neutro Abs 8.7 (H) 1.7 - 7.7 K/uL   Lymphocytes Relative 9 %  Lymphs Abs 1.0 0.7 - 4.0 K/uL   Monocytes Relative 11 %   Monocytes Absolute 1.3 (H) 0.1 - 1.0 K/uL   Eosinophils Relative 1 %   Eosinophils Absolute 0.1 0.0 - 0.5 K/uL   Basophils Relative 1 %   Basophils Absolute 0.1 0.0 - 0.1 K/uL   Immature Granulocytes 0 %   Abs Immature Granulocytes 0.04 0.00 - 0.07 K/uL  Protime-INR     Status: Abnormal   Collection Time: 06/13/24 11:28 AM  Result Value Ref Range   Prothrombin  Time 16.7 (H) 11.4 - 15.2 seconds   INR 1.3 (H) 0.8 - 1.2  Troponin I (High Sensitivity)     Status: None   Collection Time: 06/13/24 11:28 AM  Result Value Ref Range   Troponin I (High Sensitivity) 6 <18 ng/L  Brain natriuretic peptide     Status: None   Collection Time: 06/13/24 11:28 AM  Result Value Ref Range   B Natriuretic Peptide 96.2 0.0 - 100.0 pg/mL  I-Stat Lactic Acid, ED     Status: Abnormal   Collection Time: 06/13/24 11:44 AM  Result Value Ref Range   Lactic Acid, Venous 2.3 (HH) 0.5 - 1.9 mmol/L   Comment NOTIFIED PHYSICIAN   Urinalysis, w/ Reflex to Culture (Infection Suspected) -Urine, Clean Catch     Status: Abnormal   Collection Time: 06/13/24  4:25 PM  Result Value Ref Range   Specimen Source URINE, CLEAN CATCH    Color, Urine RED (A) YELLOW   APPearance TURBID (A) CLEAR   Specific Gravity, Urine  1.005 - 1.030    TEST NOT REPORTED DUE TO COLOR INTERFERENCE OF URINE PIGMENT   pH  5.0 - 8.0    TEST NOT REPORTED DUE TO COLOR INTERFERENCE OF URINE PIGMENT   Glucose, UA (A) NEGATIVE mg/dL    TEST NOT REPORTED DUE TO COLOR INTERFERENCE OF URINE PIGMENT   Hgb urine dipstick (A) NEGATIVE    TEST NOT REPORTED DUE TO COLOR INTERFERENCE OF URINE PIGMENT   Bilirubin Urine (A) NEGATIVE    TEST NOT REPORTED DUE TO COLOR INTERFERENCE OF URINE PIGMENT   Ketones, ur NEGATIVE  NEGATIVE mg/dL   Protein, ur (A) NEGATIVE mg/dL    TEST NOT REPORTED DUE TO COLOR INTERFERENCE OF URINE PIGMENT   Nitrite (A) NEGATIVE    TEST NOT REPORTED DUE TO COLOR INTERFERENCE OF URINE PIGMENT   Leukocytes,Ua (A) NEGATIVE    TEST NOT REPORTED DUE TO COLOR INTERFERENCE OF URINE PIGMENT   Squamous Epithelial / HPF NONE SEEN 0 - 5 /HPF   WBC, UA 0-5 0 - 5 WBC/hpf   RBC / HPF >50 0 - 5 RBC/hpf   Bacteria, UA MANY (A) NONE SEEN  Troponin I (High Sensitivity)     Status: None   Collection Time: 06/13/24  4:35 PM  Result Value Ref Range   Troponin I (High Sensitivity) 5 <18 ng/L  I-Stat Lactic Acid, ED     Status: Abnormal   Collection Time: 06/13/24  4:43 PM  Result Value Ref Range   Lactic Acid, Venous 5.5 (HH) 0.5 - 1.9 mmol/L   Comment NOTIFIED PHYSICIAN   I-Stat Lactic Acid, ED     Status: Abnormal   Collection Time: 06/13/24  5:44 PM  Result Value Ref Range   Lactic Acid, Venous 3.0 (HH) 0.5 - 1.9 mmol/L   Comment NOTIFIED PHYSICIAN    No results found for this or any previous visit (from the past 240 hours). Creatinine: Recent Labs  06/13/24 1128  CREATININE 1.39*    Impression/Assessment:  BPH, gross hematuria, elevated PSA status post biopsy-  Plan:  -Hold anticoagulation if medically feasible. -I will give 1 g of TXA IV -Continue CBI.  Suspect bleeding will improve. -Will start tamsulosin and finasteride which will help his bleeding and optimize chances for voiding trial success -Urology to follow  Donnice Brooks 06/13/2024, 7:50 PM      [1] No Known Allergies

## 2024-06-14 ENCOUNTER — Encounter (HOSPITAL_COMMUNITY): Payer: Self-pay | Admitting: Internal Medicine

## 2024-06-14 DIAGNOSIS — A419 Sepsis, unspecified organism: Secondary | ICD-10-CM | POA: Diagnosis not present

## 2024-06-14 DIAGNOSIS — R6520 Severe sepsis without septic shock: Secondary | ICD-10-CM | POA: Diagnosis not present

## 2024-06-14 DIAGNOSIS — R319 Hematuria, unspecified: Secondary | ICD-10-CM | POA: Diagnosis not present

## 2024-06-14 LAB — BLOOD CULTURE ID PANEL (REFLEXED) - BCID2

## 2024-06-14 LAB — BASIC METABOLIC PANEL WITH GFR
Anion gap: 11 (ref 5–15)
BUN: 21 mg/dL (ref 8–23)
CO2: 19 mmol/L — ABNORMAL LOW (ref 22–32)
Calcium: 7.8 mg/dL — ABNORMAL LOW (ref 8.9–10.3)
Chloride: 105 mmol/L (ref 98–111)
Creatinine, Ser: 1.91 mg/dL — ABNORMAL HIGH (ref 0.61–1.24)
GFR, Estimated: 37 mL/min — ABNORMAL LOW (ref >60)
Glucose, Bld: 140 mg/dL — ABNORMAL HIGH (ref 70–99)
Potassium: 4.3 mmol/L (ref 3.5–5.1)
Sodium: 135 mmol/L (ref 135–145)

## 2024-06-14 LAB — LACTIC ACID, PLASMA
Lactic Acid, Venous: 2.6 mmol/L (ref 0.5–1.9)
Lactic Acid, Venous: 2.2 mmol/L (ref 0.5–1.9)

## 2024-06-14 LAB — CBC
HCT: 41.6 % (ref 39.0–52.0)
HCT: 34.7 % — ABNORMAL LOW (ref 39.0–52.0)
Hemoglobin: 13.6 g/dL (ref 13.0–17.0)
Hemoglobin: 11.4 g/dL — ABNORMAL LOW (ref 13.0–17.0)
MCH: 27.3 pg (ref 26.0–34.0)
MCH: 27.9 pg (ref 26.0–34.0)
MCHC: 32.7 g/dL (ref 30.0–36.0)
MCHC: 32.9 g/dL (ref 30.0–36.0)
MCV: 83.5 fL (ref 80.0–100.0)
MCV: 84.8 fL (ref 80.0–100.0)
Platelets: 121 K/uL — ABNORMAL LOW (ref 150–400)
Platelets: 126 K/uL — ABNORMAL LOW (ref 150–400)
RBC: 4.98 MIL/uL (ref 4.22–5.81)
RBC: 4.09 MIL/uL — ABNORMAL LOW (ref 4.22–5.81)
RDW: 14.7 % (ref 11.5–15.5)
RDW: 14.7 % (ref 11.5–15.5)
WBC: 30.6 K/uL — ABNORMAL HIGH (ref 4.0–10.5)
WBC: 30.5 K/uL — ABNORMAL HIGH (ref 4.0–10.5)
nRBC: 0 % (ref 0.0–0.2)
nRBC: 0 % (ref 0.0–0.2)

## 2024-06-14 LAB — MRSA NEXT GEN BY PCR, NASAL: MRSA by PCR Next Gen: NOT DETECTED

## 2024-06-14 MED ORDER — LACTATED RINGERS IV BOLUS
500.0000 mL | Freq: Once | INTRAVENOUS | Status: AC
  Administered 2024-06-14: 500 mL via INTRAVENOUS

## 2024-06-14 MED ORDER — ALBUTEROL SULFATE (2.5 MG/3ML) 0.083% IN NEBU
2.5000 mg | INHALATION_SOLUTION | Freq: Three times a day (TID) | RESPIRATORY_TRACT | Status: DC
  Administered 2024-06-14 (×2): 2.5 mg via RESPIRATORY_TRACT
  Filled 2024-06-14 (×2): qty 3

## 2024-06-14 MED ORDER — LACTATED RINGERS IV BOLUS: 500.0000 mL | Freq: Once | INTRAVENOUS | Status: DC

## 2024-06-14 MED ORDER — MIDODRINE HCL 5 MG PO TABS
10.0000 mg | ORAL_TABLET | Freq: Three times a day (TID) | ORAL | Status: DC
  Administered 2024-06-14 – 2024-06-20 (×20): 10 mg via ORAL
  Filled 2024-06-14 (×20): qty 2

## 2024-06-14 MED ORDER — TAMSULOSIN HCL 0.4 MG PO CAPS
0.4000 mg | ORAL_CAPSULE | Freq: Every day | ORAL | Status: DC
  Administered 2024-06-15 – 2024-06-20 (×6): 0.4 mg via ORAL
  Filled 2024-06-14 (×6): qty 1

## 2024-06-14 MED ORDER — SODIUM CHLORIDE 0.9 % IR SOLN
3000.0000 mL | Status: DC
  Administered 2024-06-14: 3000 mL

## 2024-06-14 MED ORDER — LACTATED RINGERS IV SOLN: INTRAVENOUS | Status: DC

## 2024-06-14 MED ORDER — SODIUM CHLORIDE 0.9 % IV SOLN
1.0000 g | Freq: Once | INTRAVENOUS | Status: AC
  Administered 2024-06-14: 1 g via INTRAVENOUS
  Filled 2024-06-14: qty 10

## 2024-06-14 NOTE — Plan of Care (Signed)

## 2024-06-14 NOTE — Progress Notes (Signed)
 Pt complained of lower abdominal pain. Pt's has distended bladder. Tried to flush the CBI several times but is not working. Paged Dr. Alfornia but advised to contact Urology. Paged Dr. EMERSON Brooks but no response. Called Dr. JAYSON. Devere and replied that he is not on duty right now and trying to find out the on-call urologist. Twyla report to dayshift nurse and took charge in contacting the urologist on-duty.

## 2024-06-14 NOTE — Progress Notes (Signed)
 S: Patient complains that he needs to urinate.  O: He is in no acute distress.  Bladder is palpable.  Procedure: I tried to hand irrigate the 24 French hematuria catheter but it would not irrigate.  The balloon was deflated and it was removed.  A new 24 French hematuria catheter was placed without difficulty and drained about 800 cc of bloody urine.  I then hand irrigated him to clear.  I irrigated out some clot but much less than last night.  He was irrigated to light pink to clear and CBI was running normally.  Irrigation return equal.  Bladder soft and nondistended.    Assessment/plan:  BPH, elevated PSA, gross hematuria after prostate biopsy-last night CBI light pink to Kool-Aid, this morning after hand irrigation light pink to clear and much less clot.  -He is improving off anticoagulation. -Continue tamsulosin and finasteride -Titrate CBI to keep urine light pink with goal to wean off -Void trial when urine clear -Discussed with Dr. Devere and I let Detrick know that his prostate biopsy result was negative.  No cancer was found.  Understandably, he seems to be having second thoughts about the biopsy.  We discussed with the negative/equivocal MRI, a PSA of 39 and a very high PSA density, biopsy was/is absolutely recommended.

## 2024-06-14 NOTE — Progress Notes (Addendum)
 PROGRESS NOTE        PATIENT DETAILS Name: Steven Barnett Age: 73 y.o. Sex: male Date of Birth: 1950-12-21 Admit Date: 06/13/2024 Admitting Physician Maximino DELENA Sharps, MD ERE:[id number], Leita Repine, FNP (Inactive)  Brief Summary: Patient is a 73 y.o.  male with history of A-fib on Eliquis -who recently underwent prostate biopsy on 12/5-presented with hematuria and difficulty urinating-patient was found to have urinary retention in the setting of clot colic-evaluated by urology-Foley inserted-started on continuous bladder irrigation-subsequently blood cultures positive for gram-negative bacteremia.  Significant events: 12/11>> admit to [facility]-hematuria-urinary retention-Foley placed-CBI started 12/12>> urinary retention while on CBI-no output obtained in spite of frequent flushes-urology consulted-new French 24 hematuria catheter placed-800 cc of bloody urine obtained.  Significant studies: 12/11>> CXR: No acute cardiopulmonary findings 12/11>> CT head: No acute intracranial process  Significant microbiology data: 12/11>> COVID PCR: Negative 12/11>> blood culture: E. coli  Procedures: None  Consults: None  Subjective: When seen earlier-in some mild distress due to acute urinary retention-bladder distended and easily palpable on exam.  Subsequently urology contacted-Foley catheter was replaced-seems much more comfortable.  Objective: Vitals: Blood pressure (!) 84/61, pulse 89, temperature 98.1 F (36.7 C), temperature source Oral, resp. rate 20, height 5' 10 (1.778 m), weight 119.7 kg, SpO2 96%.   Exam: Gen Exam:Alert awake-not in any distress HEENT:atraumatic, normocephalic Chest: B/L clear to auscultation anteriorly CVS:S1S2 regular Abdomen:soft non tender, non distended-however+ distended/palpable bladder. Extremities:no edema Neurology: Non focal Skin: no rash  Pertinent Labs/Radiology:    Latest Ref Rng & Units 06/14/2024    3:37 AM  06/13/2024   11:28 AM 06/03/2024   11:43 AM  CBC  WBC 4.0 - 10.5 K/uL 30.6  11.2  6.0   Hemoglobin 13.0 - 17.0 g/dL 86.3  83.3  82.9   Hematocrit 39.0 - 52.0 % 41.6  51.2  54.3   Platelets 150 - 400 K/uL 121  174  159     Lab Results  Component Value Date   NA 135 06/14/2024   K 4.3 06/14/2024   CL 105 06/14/2024   CO2 19 (L) 06/14/2024      Assessment/Plan: Gross hematuria with clot retention leading to acute urinary retention (in the setting of recent prostate biopsy/Eliquis  use) S/p Foley catheter insertion/CBI-and TXA on 12/11-unfortunately redeveloped clot retention overnight-Foley catheter was exchanged by urology Continue to hold Eliquis  given severity/recurrent clot retention this morning Continue Flomax/finasteride Continue CBI Defer further to urology service.  Severe sepsis secondary to E. coli bacteremia (POA) Likely result of above/recent prostate biopsy On IV Rocephin  Hypotensive this morning-worsening leukocytosis-Will start IV fluid boluses-and see how he does.  If remains persistently hypotensive in spite of IV fluid boluses-May need to consult PCCM. Await culture sensitivity results  Addendum Blood pressure improved-although soft-now in the 90s systolic-will start midodrine, and continue IV fluid hydration.  Looks stable-nontoxic-appearing and is asymptomatic.  Awaiting lactate but suspect he could be monitored in the progressive care unit.  AKI Probably secondary to obstructive uropathy due to urinary retention-and some amount of hemodynamic injury in the setting of sepsis IV fluids-see above.  Acute metabolic encephalopathy Likely secondary to bacteremia/AKI-seems to have resolved-patient is relatively awake and alert this morning.  ?  Hypoxia Not certain if this is a true pulse ox reading No evidence of PNA or CHF clinically-on Eliquis -so doubt VTE Will attempt to titrate oxygen and  see how he does  History of chronic HFpEF Euvolemic Due to  hypotension-hold all diuretics  HTN Hold Cardizem -hypotensive this morning  History of ICH 2023 In the setting of Coumadin  use-has been switched to Eliquis  but currently on hold Supportive care  OSA Monitor-start CPAP if needed.  Class II obesity: Estimated body mass index is 37.86 kg/m as calculated from the following:   Height as of this encounter: 5' 10 (1.778 m).   Weight as of this encounter: 119.7 kg.   The patient is critically ill with multiple organ system failure and requires high complexity decision making for assessment and support, frequent evaluation and titration of therapies, advanced monitoring, review of radiographic studies and interpretation of complex data.   Total Critical time spent equals 45 minutes  Code status:   Code Status: Full Code   DVT Prophylaxis:SCD's given severe verity of hematuria causing recurrent clot retention/urinary retention  Family Communication: None at bedside   Disposition Plan: Status is: Inpatient Remains inpatient appropriate because: Severity of illness   Planned Discharge Destination:Home   Diet: Diet Order             Diet Heart Room service appropriate? Yes; Fluid consistency: Thin  Diet effective now                     Antimicrobial agents: Anti-infectives (From admission, onward)    Start     Dose/Rate Route Frequency Ordered Stop   06/14/24 1400  cefTRIAXone  (ROCEPHIN ) 2 g in sodium chloride  0.9 % 100 mL IVPB        2 g 200 mL/hr over 30 Minutes Intravenous Every 24 hours 06/13/24 1606     06/14/24 0215  cefTRIAXone  (ROCEPHIN ) 1 g in sodium chloride  0.9 % 100 mL IVPB       Note to Pharmacy: Please give in addition to 1g dose give earlier to complete 2g total for bacteremia   1 g 200 mL/hr over 30 Minutes Intravenous  Once 06/14/24 0206 06/14/24 0257   06/13/24 1330  cefTRIAXone  (ROCEPHIN ) 1 g in sodium chloride  0.9 % 100 mL IVPB  Status:  Discontinued        1 g 200 mL/hr over 30 Minutes  Intravenous  Once 06/13/24 1325 06/13/24 1931   06/13/24 1330  azithromycin  (ZITHROMAX ) 500 mg in sodium chloride  0.9 % 250 mL IVPB        500 mg 250 mL/hr over 60 Minutes Intravenous  Once 06/13/24 1325 06/13/24 1511        MEDICATIONS: Scheduled Meds:  albuterol   2.5 mg Nebulization TID   allopurinol   100 mg Oral Daily   Chlorhexidine  Gluconate Cloth  6 each Topical Daily   finasteride  5 mg Oral Daily   sodium chloride  flush  3 mL Intravenous Q12H   tamsulosin  0.4 mg Oral QPC supper   Continuous Infusions:  cefTRIAXone  (ROCEPHIN )  IV     lactated ringers      lactated ringers  75 mL/hr at 06/14/24 0850   PRN Meds:.acetaminophen  **OR** acetaminophen , albuterol    I have personally reviewed following labs and imaging studies  LABORATORY DATA: CBC: Recent Labs  Lab 06/13/24 1128 06/14/24 0337  WBC 11.2* 30.6*  NEUTROABS 8.7*  --   HGB 16.6 13.6  HCT 51.2 41.6  MCV 85.2 83.5  PLT 174 121*    Basic Metabolic Panel: Recent Labs  Lab 06/13/24 1128 06/14/24 0337  NA 136 135  K 4.1 4.3  CL 100 105  CO2 25  19*  GLUCOSE 131* 140*  BUN 16 21  CREATININE 1.39* 1.91*  CALCIUM  8.5* 7.8*    GFR: Estimated Creatinine Clearance: 45.3 mL/min (A) (by C-G formula based on SCr of 1.91 mg/dL (H)).  Liver Function Tests: Recent Labs  Lab 06/13/24 1128  AST 27  ALT 15  ALKPHOS 90  BILITOT 2.3*  PROT 7.4  ALBUMIN 3.3*   No results for input(s): LIPASE, AMYLASE in the last 168 hours. No results for input(s): AMMONIA in the last 168 hours.  Coagulation Profile: Recent Labs  Lab 06/13/24 1128  INR 1.3*    Cardiac Enzymes: No results for input(s): CKTOTAL, CKMB, CKMBINDEX, TROPONINI in the last 168 hours.  BNP (last 3 results) No results for input(s): PROBNP in the last 8760 hours.  Lipid Profile: No results for input(s): CHOL, HDL, LDLCALC, TRIG, CHOLHDL, LDLDIRECT in the last 72 hours.  Thyroid Function Tests: No results for  input(s): TSH, T4TOTAL, FREET4, T3FREE, THYROIDAB in the last 72 hours.  Anemia Panel: No results for input(s): VITAMINB12, FOLATE, FERRITIN, TIBC, IRON, RETICCTPCT in the last 72 hours.  Urine analysis:    Component Value Date/Time   COLORURINE RED (A) 06/13/2024 1625   APPEARANCEUR TURBID (A) 06/13/2024 1625   LABSPEC  06/13/2024 1625    TEST NOT REPORTED DUE TO COLOR INTERFERENCE OF URINE PIGMENT   PHURINE  06/13/2024 1625    TEST NOT REPORTED DUE TO COLOR INTERFERENCE OF URINE PIGMENT   GLUCOSEU (A) 06/13/2024 1625    TEST NOT REPORTED DUE TO COLOR INTERFERENCE OF URINE PIGMENT   HGBUR (A) 06/13/2024 1625    TEST NOT REPORTED DUE TO COLOR INTERFERENCE OF URINE PIGMENT   BILIRUBINUR (A) 06/13/2024 1625    TEST NOT REPORTED DUE TO COLOR INTERFERENCE OF URINE PIGMENT   KETONESUR NEGATIVE 06/13/2024 1625   PROTEINUR (A) 06/13/2024 1625    TEST NOT REPORTED DUE TO COLOR INTERFERENCE OF URINE PIGMENT   UROBILINOGEN 2.0 (H) 08/14/2020 1735   NITRITE (A) 06/13/2024 1625    TEST NOT REPORTED DUE TO COLOR INTERFERENCE OF URINE PIGMENT   LEUKOCYTESUR (A) 06/13/2024 1625    TEST NOT REPORTED DUE TO COLOR INTERFERENCE OF URINE PIGMENT    Sepsis Labs: Lactic Acid, Venous    Component Value Date/Time   LATICACIDVEN 3.0 (HH) 06/13/2024 1744    MICROBIOLOGY: Recent Results (from the past 240 hours)  Blood Culture (routine x 2)     Status: None (Preliminary result)   Collection Time: 06/13/24 11:50 AM   Specimen: BLOOD  Result Value Ref Range Status   Specimen Description BLOOD RIGHT ANTECUBITAL  Final   Special Requests   Final    BOTTLES DRAWN AEROBIC AND ANAEROBIC Blood Culture adequate volume   Culture  Setup Time   Final    GRAM NEGATIVE RODS IN BOTH AEROBIC AND ANAEROBIC BOTTLES CRITICAL VALUE NOTED.  VALUE IS CONSISTENT WITH PREVIOUSLY REPORTED AND CALLED VALUE. Performed at [facility], [address], [place], [place] [address]    Culture  GRAM NEGATIVE RODS  Final   Report Status PENDING  Incomplete  Blood Culture (routine x 2)     Status: Abnormal (Preliminary result)   Collection Time: 06/13/24 12:18 PM   Specimen: BLOOD LEFT HAND  Result Value Ref Range Status   Specimen Description BLOOD LEFT HAND  Final   Special Requests   Final    BOTTLES DRAWN AEROBIC AND ANAEROBIC Blood Culture adequate volume   Culture  Setup Time  Final    GRAM NEGATIVE RODS IN BOTH AEROBIC AND ANAEROBIC BOTTLES CRITICAL RESULT CALLED TO, READ BACK BY AND VERIFIED WITH: PHARMD  J LEDFORD 06/04/2024 @ 0140 BY AB    Culture (A)  Final    ESCHERICHIA COLI SUSCEPTIBILITIES TO FOLLOW Performed at [facility], [address] [place], [place] [address]    Report Status PENDING  Incomplete  Blood Culture ID Panel (Reflexed)     Status: Abnormal   Collection Time: 06/13/24 12:18 PM  Result Value Ref Range Status   Enterococcus faecalis NOT DETECTED NOT DETECTED Corrected   Enterococcus Faecium NOT DETECTED NOT DETECTED Corrected   Listeria monocytogenes NOT DETECTED NOT DETECTED Corrected   Staphylococcus species NOT DETECTED NOT DETECTED Corrected   Staphylococcus aureus (BCID) NOT DETECTED NOT DETECTED Corrected   Staphylococcus epidermidis NOT DETECTED NOT DETECTED Corrected   Staphylococcus lugdunensis NOT DETECTED NOT DETECTED Corrected   Streptococcus species NOT DETECTED NOT DETECTED Corrected   Streptococcus agalactiae NOT DETECTED NOT DETECTED Corrected   Streptococcus pneumoniae NOT DETECTED NOT DETECTED Corrected   Streptococcus pyogenes NOT DETECTED NOT DETECTED Corrected   A.calcoaceticus-baumannii NOT DETECTED NOT DETECTED Corrected   Bacteroides fragilis NOT DETECTED NOT DETECTED Corrected   Enterobacterales DETECTED (A) NOT DETECTED Corrected    Comment: Enterobacterales represent a large order of gram negative bacteria, not a single organism. CRITICAL RESULT CALLED TO, READ BACK BY AND VERIFIED WITH: PHARMD  J LEDFORD  06/04/2024 @ 0140 BY AB    Enterobacter cloacae complex NOT DETECTED NOT DETECTED Corrected   Escherichia coli DETECTED (A) NOT DETECTED Corrected    Comment: CRITICAL RESULT CALLED TO, READ BACK BY AND VERIFIED WITH: PHARMD  J LEDFORD 06/04/2024 @ 0140 BY AB    Klebsiella aerogenes NOT DETECTED NOT DETECTED Corrected   Klebsiella oxytoca NOT DETECTED NOT DETECTED Corrected   Klebsiella pneumoniae NOT DETECTED NOT DETECTED Corrected   Proteus species NOT DETECTED NOT DETECTED Corrected   Salmonella species NOT DETECTED NOT DETECTED Corrected   Serratia marcescens NOT DETECTED NOT DETECTED Corrected   Haemophilus influenzae NOT DETECTED NOT DETECTED Corrected   Neisseria meningitidis NOT DETECTED NOT DETECTED Corrected   Pseudomonas aeruginosa NOT DETECTED NOT DETECTED Corrected   Stenotrophomonas maltophilia NOT DETECTED NOT DETECTED Corrected   Candida albicans NOT DETECTED NOT DETECTED Corrected   Candida auris NOT DETECTED NOT DETECTED Corrected   Candida glabrata NOT DETECTED NOT DETECTED Corrected   Candida krusei NOT DETECTED NOT DETECTED Corrected   Candida parapsilosis NOT DETECTED NOT DETECTED Corrected   Candida tropicalis NOT DETECTED NOT DETECTED Corrected   Cryptococcus neoformans/gattii NOT DETECTED NOT DETECTED Corrected   CTX-M ESBL NOT DETECTED NOT DETECTED Corrected   Carbapenem resistance IMP NOT DETECTED NOT DETECTED Corrected   Carbapenem resistance KPC NOT DETECTED NOT DETECTED Corrected   Carbapenem resistance NDM NOT DETECTED NOT DETECTED Corrected   Carbapenem resist OXA 48 LIKE NOT DETECTED NOT DETECTED Corrected   Carbapenem resistance VIM NOT DETECTED NOT DETECTED Corrected    Comment: Performed at [facility], [address], [place], [place] [address]  Respiratory (~20 pathogens) panel by PCR     Status: None   Collection Time: 06/13/24  5:17 PM   Specimen: Nasopharyngeal Swab; Respiratory  Result Value Ref Range Status   Adenovirus NOT DETECTED  NOT DETECTED Final   Coronavirus 229E NOT DETECTED NOT DETECTED Final    Comment: (NOTE) The Coronavirus on the Respiratory Panel, DOES NOT test for the novel  Coronavirus (  2019 nCoV)    Coronavirus HKU1 NOT DETECTED NOT DETECTED Final   Coronavirus NL63 NOT DETECTED NOT DETECTED Final   Coronavirus OC43 NOT DETECTED NOT DETECTED Final   Metapneumovirus NOT DETECTED NOT DETECTED Final   Rhinovirus / Enterovirus NOT DETECTED NOT DETECTED Final   Influenza A NOT DETECTED NOT DETECTED Final   Influenza B NOT DETECTED NOT DETECTED Final   Parainfluenza Virus 1 NOT DETECTED NOT DETECTED Final   Parainfluenza Virus 2 NOT DETECTED NOT DETECTED Final   Parainfluenza Virus 3 NOT DETECTED NOT DETECTED Final   Parainfluenza Virus 4 NOT DETECTED NOT DETECTED Final   Respiratory Syncytial Virus NOT DETECTED NOT DETECTED Final   Bordetella pertussis NOT DETECTED NOT DETECTED Final   Bordetella Parapertussis NOT DETECTED NOT DETECTED Final   Chlamydophila pneumoniae NOT DETECTED NOT DETECTED Final   Mycoplasma pneumoniae NOT DETECTED NOT DETECTED Final    Comment: Performed at [facility], [address], [place], [place] [address]  MRSA Next Gen by PCR, Nasal     Status: None   Collection Time: 06/13/24 11:02 PM   Specimen: Nasal Mucosa; Nasal Swab  Result Value Ref Range Status   MRSA by PCR Next Gen NOT DETECTED NOT DETECTED Final    Comment: (NOTE) The GeneXpert MRSA Assay (FDA approved for NASAL specimens only), is one component of a comprehensive MRSA colonization surveillance program. It is not intended to diagnose MRSA infection nor to guide or monitor treatment for MRSA infections. Test performance is not FDA approved in patients less than 66 years old. Performed at [name], [address], [place], [place] [address]     RADIOLOGY STUDIES/RESULTS: CT HEAD WO CONTRAST ( ) Result Date: 06/13/2024 CLINICAL DATA:  Altered level of consciousness EXAM: CT HEAD  WITHOUT CONTRAST TECHNIQUE: Contiguous axial images were obtained from the base of the skull through the vertex without intravenous contrast. RADIATION DOSE REDUCTION: This exam was performed according to the departmental dose-optimization program which includes automated exposure control, adjustment of the mA and/or kV according to patient size and/or use of iterative reconstruction technique. COMPARISON:  10/13/2021 FINDINGS: Brain: Stable chronic small-vessel ischemic changes throughout the periventricular white matter. No evidence of acute infarct or hemorrhage. Lateral ventricles and midline structures are stable. No acute extra-axial fluid collections. No mass effect. Vascular: No hyperdense vessel or unexpected calcification. Skull: Normal. Negative for fracture or focal lesion. Sinuses/Orbits: No acute finding. Other: None. IMPRESSION: 1. No acute intracranial process. Electronically Signed   By: Ozell Daring M.D.   On: 06/13/2024 20:27   DG Chest Portable 1 View Result Date: 06/13/2024 CLINICAL DATA:  Shortness of breath. EXAM: PORTABLE CHEST 1 VIEW COMPARISON:  06/03/2024. FINDINGS: Stable cardiomegaly. Similar retrocardiac densities on the right, may relate to dilated esophagus. No overt pulmonary edema, focal consolidation, pleural effusion, or pneumothorax. No acute osseous abnormality. IMPRESSION: No acute cardiopulmonary findings. Electronically Signed   By: Harrietta Sherry M.D.   On: 06/13/2024 12:27     LOS: 1 day   Donalda Applebaum, MD  [facility]    To contact the attending provider between 7A-7P or the covering provider during after hours 7P-7A, please log into the web site [email or web address] and access using universal [place] password for that web site. If you do not have the password, please call the hospital operator.  06/14/2024, 10:15 AM

## 2024-06-14 NOTE — TOC Initial Note (Signed)
 Transition of Care [facility]) - Initial/Assessment Note    Patient Details  Name: Steven Barnett MRN: [id number] Date of Birth: 23-Mar-1951  [facility] CM/SW Contact:    [name], RN Phone Number: 06/14/2024, 3:38 PM  Clinical Narrative:                 RR:[id number] history significant of hypertension, diastolic congestive heart failure atrial fibrillation on chronic anticoagulation, history of intracranial hemorrhage, CKD, morbid obesity presents with hematuria and abdominal pain following a recent prostate biopsy.   Patient lives with daughter, reports having support at home and transportation. Daughter will transport home at d/c. Patient has PCP, manages own medicaitons, DME reviewed-cane at home.   Continued medical workup.  CM will continue to follow.   Expected Discharge Plan:  (TBD) Barriers to Discharge: Continued Medical Work up   Patient Goals and CMS Choice            Expected Discharge Plan and Services       Living arrangements for the past 2 months: Single Family Home                                      Prior Living Arrangements/Services Living arrangements for the past 2 months: Single Family Home Lives with:: Self, Adult Children Patient language and need for interpreter reviewed:: Yes Do you feel safe going back to the place where you live?: Yes      Need for Family Participation in Patient Care: Yes (Comment) Care giver support system in place?: Yes (comment) Current home services: DME (cane) Criminal Activity/Legal Involvement Pertinent to Current Situation/Hospitalization: No - Comment as needed  Activities of Daily Living   ADL Screening (condition at time of admission) Independently performs ADLs?: Yes (appropriate for developmental age) Is the patient deaf or have difficulty hearing?: Yes Does the patient have difficulty seeing, even when wearing glasses/contacts?: Yes Does the patient have difficulty concentrating,  remembering, or making decisions?: Yes  Permission Sought/Granted                  Emotional Assessment Appearance:: Developmentally appropriate Attitude/Demeanor/Rapport: Engaged Affect (typically observed): Calm Orientation: : Oriented to Self, Oriented to Place, Oriented to  Time, Oriented to Situation Alcohol / Substance Use: Not Applicable Psych Involvement: No (comment)  Admission diagnosis:  Dehydration [E86.0] Urinary retention [R33.9] Gross hematuria [R31.0] COPD exacerbation (HCC) [J44.1] Acute respiratory failure with hypoxia (HCC) [J96.01] Patient Active Problem List   Diagnosis Date Noted   Hematuria 06/13/2024   Leukocytosis 06/13/2024   Lactic acidosis 06/13/2024   Acute metabolic encephalopathy 06/13/2024   History of intracranial hemorrhage 06/13/2024   Atrial flutter (HCC) 06/03/2024   Chest pain 06/03/2024   Acute cough 06/03/2024   Chronic bronchitis (HCC) 12/05/2023   Constipation 10/16/2021   Atrial fibrillation with rapid ventricular response (HCC) 10/13/2021   Hyponatremia 10/13/2021   Gout 10/13/2021   Hypertensive emergency    Seizure (HCC)    ICH (intracerebral hemorrhage) (HCC) 10/09/2021   Hypovolemia 01/14/2021   Hypokalemia 01/14/2021   Hypomagnesemia 01/14/2021   Normocytic anemia 01/14/2021   Gait instability 01/14/2021   Hypotension 08/17/2016   SIRS (systemic inflammatory response syndrome) (HCC) 08/17/2016   Influenza A 08/17/2016   Septic shock (HCC) 08/10/2016   Neutropenia, drug-induced    Gastroenteritis due to norovirus    Acute kidney injury    Genetic testing 07/01/2016  Port catheter in place 06/29/2016   Family history of colon cancer 06/08/2016   Family history of colonic polyps 06/08/2016   Cancer of right colon (HCC) 04/12/2016   Pre-operative cardiovascular examination 04/06/2016   Acute on chronic diastolic heart failure (HCC) 02/02/2016   Acute respiratory failure with hypoxia (HCC) 02/02/2016   Anemia,  iron deficiency 02/02/2016   Current use of long term anticoagulation 12/03/2015   Pneumonia 09/30/2015   PNA (pneumonia) 09/30/2015   Acute on chronic congestive heart failure (HCC)    CHF exacerbation (HCC) 07/08/2015   Community acquired pneumonia 02/10/2015   ED (erectile dysfunction) 08/12/2014   Arthralgia of both knees 04/08/2014   Chronic diastolic (congestive) heart failure (HCC) 01/14/2009   Morbid obesity (HCC) 12/31/2008   Obstructive sleep apnea 12/31/2008   Essential hypertension, benign 12/31/2008   Atrial fibrillation (HCC) 12/31/2008   PCP:  Jason Leita Repine, FNP (Inactive) Pharmacy:   [facility] [address] [place], [place] - [address] [place] [address] Phone: [phone] Fax: [phone]  [facility] - [place], [place] - [address] [place] [address] Phone: [phone] Fax: [phone]     Social Drivers of Health (SDOH) Social History: SDOH Screenings   Food Insecurity: No Food Insecurity (06/13/2024)  Housing: Low Risk (06/13/2024)  Transportation Needs: No Transportation Needs (06/13/2024)  Utilities: At Risk (06/13/2024)  Depression (PHQ2-9): Low Risk (12/05/2023)  Social Connections: Moderately Isolated (06/13/2024)  Tobacco Use: Medium Risk (06/14/2024)   SDOH Interventions:     Readmission Risk Interventions     No data to display

## 2024-06-14 NOTE — Progress Notes (Signed)
 PHARMACY - PHYSICIAN COMMUNICATION CRITICAL VALUE ALERT - BLOOD CULTURE IDENTIFICATION (BCID)  Steven Barnett is an 73 y.o. male who presented to [facility] on 06/13/2024 with a chief complaint of hematuria  Name of physician (or Provider) Contacted: Dr. Alfornia  Current antibiotics: Ceftriaxone  2g IV q24h  Changes to prescribed antibiotics recommended:  No changes  Results for orders placed or performed during the hospital encounter of 06/13/24  Blood Culture ID Panel (Reflexed) (Collected: 06/13/2024 12:18 PM)  Result Value Ref Range   Enterococcus faecalis NOT DETECTED NOT DETECTED   Enterococcus Faecium NOT DETECTED NOT DETECTED   Listeria monocytogenes NOT DETECTED NOT DETECTED   Staphylococcus species NOT DETECTED NOT DETECTED   Staphylococcus aureus (BCID) NOT DETECTED NOT DETECTED   Staphylococcus epidermidis NOT DETECTED NOT DETECTED   Staphylococcus lugdunensis NOT DETECTED NOT DETECTED   Streptococcus species NOT DETECTED NOT DETECTED   Streptococcus agalactiae NOT DETECTED NOT DETECTED   Streptococcus pneumoniae NOT DETECTED NOT DETECTED   Streptococcus pyogenes NOT DETECTED NOT DETECTED   A.calcoaceticus-baumannii NOT DETECTED NOT DETECTED   Bacteroides fragilis NOT DETECTED NOT DETECTED   Enterobacterales DETECTED (A) NOT DETECTED   Enterobacter cloacae complex NOT DETECTED NOT DETECTED   Escherichia coli DETECTED (A) NOT DETECTED   Klebsiella aerogenes NOT DETECTED NOT DETECTED   Klebsiella oxytoca NOT DETECTED NOT DETECTED   Klebsiella pneumoniae NOT DETECTED NOT DETECTED   Proteus species NOT DETECTED NOT DETECTED   Salmonella species NOT DETECTED NOT DETECTED   Serratia marcescens NOT DETECTED NOT DETECTED   Haemophilus influenzae NOT DETECTED NOT DETECTED   Neisseria meningitidis NOT DETECTED NOT DETECTED   Pseudomonas aeruginosa NOT DETECTED NOT DETECTED   Stenotrophomonas maltophilia NOT DETECTED NOT DETECTED   Candida albicans NOT DETECTED NOT DETECTED    Candida auris NOT DETECTED NOT DETECTED   Candida glabrata NOT DETECTED NOT DETECTED   Candida krusei NOT DETECTED NOT DETECTED   Candida parapsilosis NOT DETECTED NOT DETECTED   Candida tropicalis NOT DETECTED NOT DETECTED   Cryptococcus neoformans/gattii NOT DETECTED NOT DETECTED   CTX-M ESBL NOT DETECTED NOT DETECTED   Carbapenem resistance IMP NOT DETECTED NOT DETECTED   Carbapenem resistance KPC NOT DETECTED NOT DETECTED   Carbapenem resistance NDM NOT DETECTED NOT DETECTED   Carbapenem resist OXA 48 LIKE NOT DETECTED NOT DETECTED   Carbapenem resistance VIM NOT DETECTED NOT DETECTED    Clair Agent 06/14/2024  2:05 AM

## 2024-06-15 ENCOUNTER — Inpatient Hospital Stay (HOSPITAL_COMMUNITY): Admitting: Anesthesiology

## 2024-06-15 ENCOUNTER — Other Ambulatory Visit: Payer: Self-pay | Admitting: Urology

## 2024-06-15 ENCOUNTER — Encounter (HOSPITAL_COMMUNITY)
Admission: EM | Disposition: A | Discharge status: Home Health | Payer: Self-pay | Source: Home / Self Care | Attending: Internal Medicine

## 2024-06-15 ENCOUNTER — Encounter (HOSPITAL_COMMUNITY): Payer: Self-pay | Admitting: Internal Medicine

## 2024-06-15 ENCOUNTER — Inpatient Hospital Stay (HOSPITAL_COMMUNITY)

## 2024-06-15 DIAGNOSIS — I4891 Unspecified atrial fibrillation: Secondary | ICD-10-CM | POA: Diagnosis not present

## 2024-06-15 DIAGNOSIS — I110 Hypertensive heart disease with heart failure: Secondary | ICD-10-CM

## 2024-06-15 DIAGNOSIS — R310 Gross hematuria: Secondary | ICD-10-CM | POA: Diagnosis not present

## 2024-06-15 DIAGNOSIS — G9341 Metabolic encephalopathy: Secondary | ICD-10-CM | POA: Diagnosis not present

## 2024-06-15 DIAGNOSIS — N179 Acute kidney failure, unspecified: Secondary | ICD-10-CM | POA: Diagnosis not present

## 2024-06-15 DIAGNOSIS — I5033 Acute on chronic diastolic (congestive) heart failure: Secondary | ICD-10-CM

## 2024-06-15 DIAGNOSIS — I5032 Chronic diastolic (congestive) heart failure: Secondary | ICD-10-CM | POA: Diagnosis not present

## 2024-06-15 DIAGNOSIS — J9601 Acute respiratory failure with hypoxia: Secondary | ICD-10-CM | POA: Diagnosis not present

## 2024-06-15 HISTORY — PX: CYSTOSCOPY: SHX5120

## 2024-06-15 LAB — CBC
HCT: 29.2 % — ABNORMAL LOW (ref 39.0–52.0)
HCT: 26.2 % — ABNORMAL LOW (ref 39.0–52.0)
Hemoglobin: 9.7 g/dL — ABNORMAL LOW (ref 13.0–17.0)
Hemoglobin: 8.5 g/dL — ABNORMAL LOW (ref 13.0–17.0)
MCH: 27.6 pg (ref 26.0–34.0)
MCH: 27 pg (ref 26.0–34.0)
MCHC: 33.2 g/dL (ref 30.0–36.0)
MCHC: 32.4 g/dL (ref 30.0–36.0)
MCV: 83.2 fL (ref 80.0–100.0)
MCV: 83.2 fL (ref 80.0–100.0)
Platelets: 131 K/uL — ABNORMAL LOW (ref 150–400)
Platelets: 124 K/uL — ABNORMAL LOW (ref 150–400)
RBC: 3.51 MIL/uL — ABNORMAL LOW (ref 4.22–5.81)
RBC: 3.15 MIL/uL — ABNORMAL LOW (ref 4.22–5.81)
RDW: 14.6 % (ref 11.5–15.5)
RDW: 14.7 % (ref 11.5–15.5)
WBC: 32 K/uL — ABNORMAL HIGH (ref 4.0–10.5)
WBC: 25.2 K/uL — ABNORMAL HIGH (ref 4.0–10.5)
nRBC: 0.1 % (ref 0.0–0.2)
nRBC: 0 % (ref 0.0–0.2)

## 2024-06-15 LAB — HEPATIC FUNCTION PANEL
ALT: 21 U/L (ref 0–44)
AST: 34 U/L (ref 15–41)
Albumin: 2.1 g/dL — ABNORMAL LOW (ref 3.5–5.0)
Alkaline Phosphatase: 54 U/L (ref 38–126)
Bilirubin, Direct: 0.3 mg/dL — ABNORMAL HIGH (ref 0.0–0.2)
Indirect Bilirubin: 0.4 mg/dL (ref 0.3–0.9)
Total Bilirubin: 0.7 mg/dL (ref 0.0–1.2)
Total Protein: 5 g/dL — ABNORMAL LOW (ref 6.5–8.1)

## 2024-06-15 LAB — BASIC METABOLIC PANEL WITH GFR
Anion gap: 9 (ref 5–15)
BUN: 38 mg/dL — ABNORMAL HIGH (ref 8–23)
CO2: 24 mmol/L (ref 22–32)
Calcium: 7.4 mg/dL — ABNORMAL LOW (ref 8.9–10.3)
Chloride: 99 mmol/L (ref 98–111)
Creatinine, Ser: 1.81 mg/dL — ABNORMAL HIGH (ref 0.61–1.24)
GFR, Estimated: 39 mL/min — ABNORMAL LOW (ref >60)
Glucose, Bld: 100 mg/dL — ABNORMAL HIGH (ref 70–99)
Potassium: 4.9 mmol/L (ref 3.5–5.1)
Sodium: 132 mmol/L — ABNORMAL LOW (ref 135–145)

## 2024-06-15 SURGERY — CYSTOSCOPY
Anesthesia: General

## 2024-06-15 MED ORDER — LACTATED RINGERS IV SOLN: INTRAVENOUS | Status: DC

## 2024-06-15 MED ORDER — PHENYLEPHRINE 80 MCG/ML (10ML) SYRINGE FOR IV PUSH (FOR BLOOD PRESSURE SUPPORT)
PREFILLED_SYRINGE | INTRAVENOUS | Status: DC | PRN
  Administered 2024-06-15: 240 ug via INTRAVENOUS
  Administered 2024-06-15: 160 ug via INTRAVENOUS
  Administered 2024-06-15: 240 ug via INTRAVENOUS

## 2024-06-15 MED ORDER — FENTANYL CITRATE (PF) 100 MCG/2ML IJ SOLN
INTRAMUSCULAR | Status: AC
  Filled 2024-06-15: qty 2

## 2024-06-15 MED ORDER — LIDOCAINE 2% (20 MG/ML) 5 ML SYRINGE
INTRAMUSCULAR | Status: DC | PRN
  Administered 2024-06-15: 60 mg via INTRAVENOUS

## 2024-06-15 MED ORDER — CHLORHEXIDINE GLUCONATE 0.12 % MT SOLN
15.0000 mL | Freq: Once | OROMUCOSAL | Status: AC
  Administered 2024-06-15: 15 mL via OROMUCOSAL

## 2024-06-15 MED ORDER — FENTANYL CITRATE (PF) 100 MCG/2ML IJ SOLN: 25.0000 ug | INTRAMUSCULAR | Status: DC | PRN

## 2024-06-15 MED ORDER — ALBUTEROL SULFATE (2.5 MG/3ML) 0.083% IN NEBU
2.5000 mg | INHALATION_SOLUTION | Freq: Two times a day (BID) | RESPIRATORY_TRACT | Status: DC
  Administered 2024-06-15 (×2): 2.5 mg via RESPIRATORY_TRACT
  Filled 2024-06-15 (×2): qty 3

## 2024-06-15 MED ORDER — EPHEDRINE SULFATE-NACL 50-0.9 MG/10ML-% IV SOSY
PREFILLED_SYRINGE | INTRAVENOUS | Status: DC | PRN
  Administered 2024-06-15: 2.5 mg via INTRAVENOUS

## 2024-06-15 MED ORDER — DEXAMETHASONE SOD PHOSPHATE PF 10 MG/ML IJ SOLN
INTRAMUSCULAR | Status: DC | PRN
  Administered 2024-06-15: 10 mg via INTRAVENOUS

## 2024-06-15 MED ORDER — FENTANYL CITRATE (PF) 250 MCG/5ML IJ SOLN
INTRAMUSCULAR | Status: DC | PRN
  Administered 2024-06-15 (×2): 25 ug via INTRAVENOUS

## 2024-06-15 MED ORDER — LACTATED RINGERS IV SOLN: INTRAVENOUS | Status: DC | PRN

## 2024-06-15 MED ORDER — DROPERIDOL 2.5 MG/ML IJ SOLN: 0.6250 mg | Freq: Once | INTRAMUSCULAR | Status: DC | PRN

## 2024-06-15 MED ORDER — SODIUM CHLORIDE 0.9 % IR SOLN
Status: DC | PRN
  Administered 2024-06-15: 1000 mL

## 2024-06-15 MED ORDER — 0.9 % SODIUM CHLORIDE (POUR BTL) OPTIME
TOPICAL | Status: DC | PRN
  Administered 2024-06-15: 1000 mL

## 2024-06-15 MED ORDER — EPHEDRINE 5 MG/ML INJ
INTRAVENOUS | Status: AC
  Filled 2024-06-15: qty 10

## 2024-06-15 MED ORDER — PHENYLEPHRINE 80 MCG/ML (10ML) SYRINGE FOR IV PUSH (FOR BLOOD PRESSURE SUPPORT)
PREFILLED_SYRINGE | INTRAVENOUS | Status: AC
  Filled 2024-06-15: qty 10

## 2024-06-15 MED ORDER — LIDOCAINE 2% (20 MG/ML) 5 ML SYRINGE
INTRAMUSCULAR | Status: AC
  Filled 2024-06-15: qty 5

## 2024-06-15 MED ORDER — ORAL CARE MOUTH RINSE: 15.0000 mL | Freq: Once | OROMUCOSAL | Status: AC

## 2024-06-15 MED ORDER — PROPOFOL 10 MG/ML IV BOLUS
INTRAVENOUS | Status: AC
  Filled 2024-06-15: qty 20

## 2024-06-15 MED ORDER — STERILE WATER FOR IRRIGATION IR SOLN
Status: DC | PRN
  Administered 2024-06-15: 1000 mL

## 2024-06-15 MED ORDER — CHLORHEXIDINE GLUCONATE 0.12 % MT SOLN
OROMUCOSAL | Status: AC
  Filled 2024-06-15: qty 15

## 2024-06-15 MED ORDER — ONDANSETRON HCL 4 MG/2ML IJ SOLN
INTRAMUSCULAR | Status: DC | PRN
  Administered 2024-06-15: 4 mg via INTRAVENOUS

## 2024-06-15 MED ORDER — PROPOFOL 10 MG/ML IV BOLUS
INTRAVENOUS | Status: DC | PRN
  Administered 2024-06-15: 50 mg via INTRAVENOUS
  Administered 2024-06-15: 150 mg via INTRAVENOUS

## 2024-06-15 SURGICAL SUPPLY — 19 items
BAG DRAIN URO-CYSTO SKYTR STRL (DRAIN) ×1 IMPLANT
BAG URINE DRAIN 2000ML AR STRL (UROLOGICAL SUPPLIES) IMPLANT
CATH FOLEY 2WAY SLVR 5CC 16FR (CATHETERS) IMPLANT
CATH URET 5FR 28IN OPEN ENDED (CATHETERS) IMPLANT
GLOVE BIO SURGEON STRL SZ7.5 (GLOVE) ×1 IMPLANT
GOWN STRL REUS W/ TWL LRG LVL3 (GOWN DISPOSABLE) ×1 IMPLANT
GOWN STRL REUS W/ TWL XL LVL3 (GOWN DISPOSABLE) ×1 IMPLANT
GUIDEWIRE ANG ZIPWIRE 038X150 (WIRE) IMPLANT
GUIDEWIRE STR DUAL SENSOR (WIRE) ×1 IMPLANT
KIT TURNOVER KIT B (KITS) ×1 IMPLANT
MANIFOLD NEPTUNE II (INSTRUMENTS) ×3 IMPLANT
PACK CYSTO (CUSTOM PROCEDURE TRAY) ×1 IMPLANT
SOLN 0.9% NACL POUR BTL 1000ML (IV SOLUTION) ×1 IMPLANT
STENT URET 6FRX24 CONTOUR (STENTS) IMPLANT
STENT URET 6FRX26 CONTOUR (STENTS) IMPLANT
SYPHON OMNI JUG (MISCELLANEOUS) ×1 IMPLANT
TOWEL GREEN STERILE FF (TOWEL DISPOSABLE) ×1 IMPLANT
TUBE CONNECTING 12X1/4 (SUCTIONS) IMPLANT
WATER STERILE IRR 3000ML UROMA (IV SOLUTION) ×1 IMPLANT

## 2024-06-15 NOTE — Progress Notes (Signed)
 PROGRESS NOTE        PATIENT DETAILS Name: Steven Barnett Age: 73 y.o. Sex: male Date of Birth: 08-Nov-1950 Admit Date: 06/13/2024 Admitting Physician Maximino DELENA Sharps, MD ERE:[id number], Leita Repine, FNP (Inactive)  Brief Summary: Patient is a 73 y.o.  male with history of A-fib on Eliquis -who recently underwent prostate biopsy on 12/5-presented with hematuria and difficulty urinating-patient was found to have urinary retention in the setting of clot colic-evaluated by urology-Foley inserted-started on continuous bladder irrigation-subsequently blood cultures positive for gram-negative bacteremia.  Significant events: 12/11>> admit to [facility]-hematuria-urinary retention-Foley placed-CBI started 12/12>> urinary retention while on CBI-no output obtained in spite of frequent flushes-urology consulted-new French 24 hematuria catheter placed-800 cc of bloody urine obtained.  Significant studies: 12/11>> CXR: No acute cardiopulmonary findings 12/11>> CT head: No acute intracranial process  Significant microbiology data: 12/11>> COVID PCR: Negative 12/11>> blood culture: E. coli  Procedures: None  Consults: Urology  Subjective: No major issues overnight-BP has stabilized-pink-tinged urine-but per urology-catheter is not flushing as appropriately as it should.  Objective: Vitals: Blood pressure 100/70, pulse 95, temperature 97.6 F (36.4 C), temperature source Oral, resp. rate 19, height 5' 10 (1.778 m), weight 117.8 kg, SpO2 94%.   Exam: Gen Exam:Alert awake-not in any distress HEENT:atraumatic, normocephalic Chest: B/L clear to auscultation anteriorly CVS:S1S2 regular Abdomen:soft non tender, non distended Extremities:no edema Neurology: Non focal Skin: no rash  Pertinent Labs/Radiology:    Latest Ref Rng & Units 06/15/2024    4:42 AM 06/14/2024    1:57 PM 06/14/2024    3:37 AM  CBC  WBC 4.0 - 10.5 K/uL 32.0  30.5  30.6   Hemoglobin 13.0 - 17.0  g/dL 9.7  88.5  86.3   Hematocrit 39.0 - 52.0 % 29.2  34.7  41.6   Platelets 150 - 400 K/uL 131  126  121     Lab Results  Component Value Date   NA 132 (L) 06/15/2024   K 4.9 06/15/2024   CL 99 06/15/2024   CO2 24 06/15/2024      Assessment/Plan: Gross hematuria with clot retention leading to acute urinary retention (in the setting of recent prostate biopsy/Eliquis  use) S/p Foley catheter insertion/CBI-and TXA on 12/11-unfortunately even while on CBI-redeveloped clot retention on 12/12-Foley catheter was subsequently exchanged-per urology-although urine is pink-tinged today-Foley catheter still not flushing well.  Remains on CBI-urology has ordered a stat CT study to evaluate for clot burden/hydronephrosis-may need cystoscopy/clot evacuation.   Continue to hold Eliquis  given severity/recurrent clot retention this morning Continue Flomax /finasteride  Continue CBI Defer further to urology service.  Severe sepsis with hypotension secondary to E. coli bacteremia (POA) Likely result of above/recent prostate biopsy On IV Rocephin  Was hypotensive on 12/12 requiring numerous fluid boluses and initiation of midodrine  BP has now stabilized-however continues to have persistent leukocytosis-concern that they may be clot retention again-awaiting CT abdomen study. Continue IV Rocephin   AKI Probably secondary to obstructive uropathy due to urinary retention-and some amount of hemodynamic injury in the setting of sepsis Renal function improving Avoid nephrotoxic agents  Acute metabolic encephalopathy Likely secondary to bacteremia/AKI-seems to have resolved-patient is relatively awake and alert this morning.  ?  Hypoxia Not certain if this is a true pulse ox reading No evidence of PNA or CHF clinically-on Eliquis -so doubt VTE Will attempt to titrate oxygen and see how he does  History of  chronic HFpEF Euvolemic Due to hypotension-hold all diuretics  HTN Hold Cardizem -hypotensive this  morning  History of ICH 2023 In the setting of Coumadin  use-has been switched to Eliquis  but currently on hold Supportive care  OSA Monitor-start CPAP if needed.  Class II obesity: Estimated body mass index is 37.26 kg/m as calculated from the following:   Height as of this encounter: 5' 10 (1.778 m).   Weight as of this encounter: 117.8 kg.    Code status:   Code Status: Full Code   DVT Prophylaxis:SCD's-given severe verity of hematuria causing recurrent clot retention/urinary retention Eliquis  remains on hold  Family Communication: None at bedside   Disposition Plan: Status is: Inpatient Remains inpatient appropriate because: Severity of illness   Planned Discharge Destination:Home   Diet: Diet Order             Diet Heart Room service appropriate? Yes; Fluid consistency: Thin  Diet effective now                     Antimicrobial agents: Anti-infectives (From admission, onward)    Start     Dose/Rate Route Frequency Ordered Stop   06/14/24 1400  cefTRIAXone  (ROCEPHIN ) 2 g in sodium chloride  0.9 % 100 mL IVPB        2 g 200 mL/hr over 30 Minutes Intravenous Every 24 hours 06/13/24 1606     06/14/24 0215  cefTRIAXone  (ROCEPHIN ) 1 g in sodium chloride  0.9 % 100 mL IVPB       Note to Pharmacy: Please give in addition to 1g dose give earlier to complete 2g total for bacteremia   1 g 200 mL/hr over 30 Minutes Intravenous  Once 06/14/24 0206 06/14/24 0257   06/13/24 1330  cefTRIAXone  (ROCEPHIN ) 1 g in sodium chloride  0.9 % 100 mL IVPB  Status:  Discontinued        1 g 200 mL/hr over 30 Minutes Intravenous  Once 06/13/24 1325 06/13/24 1931   06/13/24 1330  azithromycin  (ZITHROMAX ) 500 mg in sodium chloride  0.9 % 250 mL IVPB        500 mg 250 mL/hr over 60 Minutes Intravenous  Once 06/13/24 1325 06/13/24 1511        MEDICATIONS: Scheduled Meds:  albuterol   2.5 mg Nebulization BID   allopurinol   100 mg Oral Daily   Chlorhexidine  Gluconate Cloth  6 each  Topical Daily   finasteride   5 mg Oral Daily   midodrine   10 mg Oral TID WC   sodium chloride  flush  3 mL Intravenous Q12H   tamsulosin   0.4 mg Oral QPC supper   Continuous Infusions:  cefTRIAXone  (ROCEPHIN )  IV 2 g (06/14/24 1352)   lactated ringers  500 mL/hr at 06/14/24 1352   lactated ringers  Stopped (06/14/24 1715)   sodium chloride  irrigation     PRN Meds:.acetaminophen  **OR** acetaminophen , albuterol    I have personally reviewed following labs and imaging studies  LABORATORY DATA: CBC: Recent Labs  Lab 06/13/24 1128 06/14/24 0337 06/14/24 1357 06/15/24 0442  WBC 11.2* 30.6* 30.5* 32.0*  NEUTROABS 8.7*  --   --   --   HGB 16.6 13.6 11.4* 9.7*  HCT 51.2 41.6 34.7* 29.2*  MCV 85.2 83.5 84.8 83.2  PLT 174 121* 126* 131*    Basic Metabolic Panel: Recent Labs  Lab 06/13/24 1128 06/14/24 0337 06/15/24 0442  NA 136 135 132*  K 4.1 4.3 4.9  CL 100 105 99  CO2 25 19* 24  GLUCOSE 131* 140*  100*  BUN 16 21 38*  CREATININE 1.39* 1.91* 1.81*  CALCIUM  8.5* 7.8* 7.4*    GFR: Estimated Creatinine Clearance: 47.4 mL/min (A) (by C-G formula based on SCr of 1.81 mg/dL (H)).  Liver Function Tests: Recent Labs  Lab 06/13/24 1128 06/15/24 0442  AST 27 34  ALT 15 21  ALKPHOS 90 54  BILITOT 2.3* 0.7  PROT 7.4 5.0*  ALBUMIN 3.3* 2.1*   No results for input(s): LIPASE, AMYLASE in the last 168 hours. No results for input(s): AMMONIA in the last 168 hours.  Coagulation Profile: Recent Labs  Lab 06/13/24 1128  INR 1.3*    Cardiac Enzymes: No results for input(s): CKTOTAL, CKMB, CKMBINDEX, TROPONINI in the last 168 hours.  BNP (last 3 results) No results for input(s): PROBNP in the last 8760 hours.  Lipid Profile: No results for input(s): CHOL, HDL, LDLCALC, TRIG, CHOLHDL, LDLDIRECT in the last 72 hours.  Thyroid Function Tests: No results for input(s): TSH, T4TOTAL, FREET4, T3FREE, THYROIDAB in the last 72  hours.  Anemia Panel: No results for input(s): VITAMINB12, FOLATE, FERRITIN, TIBC, IRON, RETICCTPCT in the last 72 hours.  Urine analysis:    Component Value Date/Time   COLORURINE RED (A) 06/13/2024 1625   APPEARANCEUR TURBID (A) 06/13/2024 1625   LABSPEC  06/13/2024 1625    TEST NOT REPORTED DUE TO COLOR INTERFERENCE OF URINE PIGMENT   PHURINE  06/13/2024 1625    TEST NOT REPORTED DUE TO COLOR INTERFERENCE OF URINE PIGMENT   GLUCOSEU (A) 06/13/2024 1625    TEST NOT REPORTED DUE TO COLOR INTERFERENCE OF URINE PIGMENT   HGBUR (A) 06/13/2024 1625    TEST NOT REPORTED DUE TO COLOR INTERFERENCE OF URINE PIGMENT   BILIRUBINUR (A) 06/13/2024 1625    TEST NOT REPORTED DUE TO COLOR INTERFERENCE OF URINE PIGMENT   KETONESUR NEGATIVE 06/13/2024 1625   PROTEINUR (A) 06/13/2024 1625    TEST NOT REPORTED DUE TO COLOR INTERFERENCE OF URINE PIGMENT   UROBILINOGEN 2.0 (H) 08/14/2020 1735   NITRITE (A) 06/13/2024 1625    TEST NOT REPORTED DUE TO COLOR INTERFERENCE OF URINE PIGMENT   LEUKOCYTESUR (A) 06/13/2024 1625    TEST NOT REPORTED DUE TO COLOR INTERFERENCE OF URINE PIGMENT    Sepsis Labs: Lactic Acid, Venous    Component Value Date/Time   LATICACIDVEN 2.2 (HH) 06/14/2024 1648    MICROBIOLOGY: Recent Results (from the past 240 hours)  Blood Culture (routine x 2)     Status: Abnormal (Preliminary result)   Collection Time: 06/13/24 11:50 AM   Specimen: BLOOD  Result Value Ref Range Status   Specimen Description BLOOD RIGHT ANTECUBITAL  Final   Special Requests   Final    BOTTLES DRAWN AEROBIC AND ANAEROBIC Blood Culture adequate volume   Culture  Setup Time   Final    GRAM NEGATIVE RODS IN BOTH AEROBIC AND ANAEROBIC BOTTLES CRITICAL VALUE NOTED.  VALUE IS CONSISTENT WITH PREVIOUSLY REPORTED AND CALLED VALUE.    Culture (A)  Final    ESCHERICHIA COLI SUSCEPTIBILITIES PERFORMED ON PREVIOUS CULTURE WITHIN THE LAST 5 DAYS. Performed at [facility], [address] [place], [place] [address]    Report Status PENDING  Incomplete  Blood Culture (routine x 2)     Status: Abnormal   Collection Time: 06/13/24 12:18 PM   Specimen: BLOOD LEFT HAND  Result Value Ref Range Status   Specimen Description BLOOD LEFT HAND  Final   Special Requests   Final  BOTTLES DRAWN AEROBIC AND ANAEROBIC Blood Culture adequate volume   Culture  Setup Time   Final    GRAM NEGATIVE RODS IN BOTH AEROBIC AND ANAEROBIC BOTTLES CRITICAL RESULT CALLED TO, READ BACK BY AND VERIFIED WITH: PHARMD  J LEDFORD 06/04/2024 @ 0140 BY AB Performed at [facility], [address], [place], [place] [address]    Culture ESCHERICHIA COLI (A)  Final   Report Status 06/15/2024 FINAL  Final   Organism ID, Bacteria ESCHERICHIA COLI  Final      Susceptibility   Escherichia coli - MIC*    AMPICILLIN 16 INTERMEDIATE Intermediate     CEFAZOLIN  (NON-URINE) 16 RESISTANT Resistant     CEFEPIME  <=0.12 SENSITIVE Sensitive     ERTAPENEM <=0.12 SENSITIVE Sensitive     CEFTRIAXONE  1 SENSITIVE Sensitive     CIPROFLOXACIN  <=0.06 SENSITIVE Sensitive     GENTAMICIN  <=1 SENSITIVE Sensitive     MEROPENEM <=0.25 SENSITIVE Sensitive     TRIMETH/SULFA <=20 SENSITIVE Sensitive     AMPICILLIN/SULBACTAM 4 SENSITIVE Sensitive     PIP/TAZO Value in next row Sensitive      <=4 SENSITIVEThis is a modified FDA-approved test that has been validated and its performance characteristics determined by the reporting laboratory.  This laboratory is certified under the Clinical Laboratory Improvement Amendments CLIA as qualified to perform high complexity clinical laboratory testing.    * ESCHERICHIA COLI  Blood Culture ID Panel (Reflexed)     Status: Abnormal   Collection Time: 06/13/24 12:18 PM  Result Value Ref Range Status   Enterococcus faecalis NOT DETECTED NOT DETECTED Corrected   Enterococcus Faecium NOT DETECTED NOT DETECTED Corrected   Listeria monocytogenes NOT DETECTED NOT DETECTED Corrected    Staphylococcus species NOT DETECTED NOT DETECTED Corrected   Staphylococcus aureus (BCID) NOT DETECTED NOT DETECTED Corrected   Staphylococcus epidermidis NOT DETECTED NOT DETECTED Corrected   Staphylococcus lugdunensis NOT DETECTED NOT DETECTED Corrected   Streptococcus species NOT DETECTED NOT DETECTED Corrected   Streptococcus agalactiae NOT DETECTED NOT DETECTED Corrected   Streptococcus pneumoniae NOT DETECTED NOT DETECTED Corrected   Streptococcus pyogenes NOT DETECTED NOT DETECTED Corrected   A.calcoaceticus-baumannii NOT DETECTED NOT DETECTED Corrected   Bacteroides fragilis NOT DETECTED NOT DETECTED Corrected   Enterobacterales DETECTED (A) NOT DETECTED Corrected    Comment: Enterobacterales represent a large order of gram negative bacteria, not a single organism. CRITICAL RESULT CALLED TO, READ BACK BY AND VERIFIED WITH: PHARMD  J LEDFORD 06/04/2024 @ 0140 BY AB    Enterobacter cloacae complex NOT DETECTED NOT DETECTED Corrected   Escherichia coli DETECTED (A) NOT DETECTED Corrected    Comment: CRITICAL RESULT CALLED TO, READ BACK BY AND VERIFIED WITH: PHARMD  J LEDFORD 06/04/2024 @ 0140 BY AB    Klebsiella aerogenes NOT DETECTED NOT DETECTED Corrected   Klebsiella oxytoca NOT DETECTED NOT DETECTED Corrected   Klebsiella pneumoniae NOT DETECTED NOT DETECTED Corrected   Proteus species NOT DETECTED NOT DETECTED Corrected   Salmonella species NOT DETECTED NOT DETECTED Corrected   Serratia marcescens NOT DETECTED NOT DETECTED Corrected   Haemophilus influenzae NOT DETECTED NOT DETECTED Corrected   Neisseria meningitidis NOT DETECTED NOT DETECTED Corrected   Pseudomonas aeruginosa NOT DETECTED NOT DETECTED Corrected   Stenotrophomonas maltophilia NOT DETECTED NOT DETECTED Corrected   Candida albicans NOT DETECTED NOT DETECTED Corrected   Candida auris NOT DETECTED NOT DETECTED Corrected   Candida glabrata NOT DETECTED NOT DETECTED Corrected   Candida krusei NOT DETECTED NOT  DETECTED Corrected  Candida parapsilosis NOT DETECTED NOT DETECTED Corrected   Candida tropicalis NOT DETECTED NOT DETECTED Corrected   Cryptococcus neoformans/gattii NOT DETECTED NOT DETECTED Corrected   CTX-M ESBL NOT DETECTED NOT DETECTED Corrected   Carbapenem resistance IMP NOT DETECTED NOT DETECTED Corrected   Carbapenem resistance KPC NOT DETECTED NOT DETECTED Corrected   Carbapenem resistance NDM NOT DETECTED NOT DETECTED Corrected   Carbapenem resist OXA 48 LIKE NOT DETECTED NOT DETECTED Corrected   Carbapenem resistance VIM NOT DETECTED NOT DETECTED Corrected    Comment: Performed at [facility], [address], [place], [place] [address]  Respiratory (~20 pathogens) panel by PCR     Status: None   Collection Time: 06/13/24  5:17 PM   Specimen: Nasopharyngeal Swab; Respiratory  Result Value Ref Range Status   Adenovirus NOT DETECTED NOT DETECTED Final   Coronavirus 229E NOT DETECTED NOT DETECTED Final    Comment: (NOTE) The Coronavirus on the Respiratory Panel, DOES NOT test for the novel  Coronavirus (2019 nCoV)    Coronavirus HKU1 NOT DETECTED NOT DETECTED Final   Coronavirus NL63 NOT DETECTED NOT DETECTED Final   Coronavirus OC43 NOT DETECTED NOT DETECTED Final   Metapneumovirus NOT DETECTED NOT DETECTED Final   Rhinovirus / Enterovirus NOT DETECTED NOT DETECTED Final   Influenza A NOT DETECTED NOT DETECTED Final   Influenza B NOT DETECTED NOT DETECTED Final   Parainfluenza Virus 1 NOT DETECTED NOT DETECTED Final   Parainfluenza Virus 2 NOT DETECTED NOT DETECTED Final   Parainfluenza Virus 3 NOT DETECTED NOT DETECTED Final   Parainfluenza Virus 4 NOT DETECTED NOT DETECTED Final   Respiratory Syncytial Virus NOT DETECTED NOT DETECTED Final   Bordetella pertussis NOT DETECTED NOT DETECTED Final   Bordetella Parapertussis NOT DETECTED NOT DETECTED Final   Chlamydophila pneumoniae NOT DETECTED NOT DETECTED Final   Mycoplasma pneumoniae NOT DETECTED NOT DETECTED  Final    Comment: Performed at [facility], [address] [place], [place] [address]  MRSA Next Gen by PCR, Nasal     Status: None   Collection Time: 06/13/24 11:02 PM   Specimen: Nasal Mucosa; Nasal Swab  Result Value Ref Range Status   MRSA by PCR Next Gen NOT DETECTED NOT DETECTED Final    Comment: (NOTE) The GeneXpert MRSA Assay (FDA approved for NASAL specimens only), is one component of a comprehensive MRSA colonization surveillance program. It is not intended to diagnose MRSA infection nor to guide or monitor treatment for MRSA infections. Test performance is not FDA approved in patients less than 79 years old. Performed at [facility], [address], [place], [place] [address]     RADIOLOGY STUDIES/RESULTS: CT HEAD WO CONTRAST ( ) Result Date: 06/13/2024 CLINICAL DATA:  Altered level of consciousness EXAM: CT HEAD WITHOUT CONTRAST TECHNIQUE: Contiguous axial images were obtained from the base of the skull through the vertex without intravenous contrast. RADIATION DOSE REDUCTION: This exam was performed according to the departmental dose-optimization program which includes automated exposure control, adjustment of the mA and/or kV according to patient size and/or use of iterative reconstruction technique. COMPARISON:  10/13/2021 FINDINGS: Brain: Stable chronic small-vessel ischemic changes throughout the periventricular white matter. No evidence of acute infarct or hemorrhage. Lateral ventricles and midline structures are stable. No acute extra-axial fluid collections. No mass effect. Vascular: No hyperdense vessel or unexpected calcification. Skull: Normal. Negative for fracture or focal lesion. Sinuses/Orbits: No acute finding. Other: None. IMPRESSION: 1. No acute intracranial process. Electronically Signed  By: Ozell Daring M.D.   On: 06/13/2024 20:27     LOS: 2 days   Donalda Applebaum, MD  [facility]    To contact the attending provider between  7A-7P or the covering provider during after hours 7P-7A, please log into the web site [email or web address] and access using universal [place] password for that web site. If you do not have the password, please call the hospital operator.  06/15/2024, 12:09 PM

## 2024-06-15 NOTE — Op Note (Signed)
 Date of procedure: 06/15/2024  Preoperative diagnosis:  Gross hematuria, clot retention  Postoperative diagnosis:  Same  Procedure: Cystoscopy, clot evacuation, fulguration  Surgeon: Redell Burnet, MD  Anesthesia: General  Complications: None  Intraoperative findings:  ~250cc old clot irrigated free from bladder Moderate size prostate Significant erythema and edema throughout the bladder but no tumors or lesions, ureteral orifices orthotopic bilaterally Subtle oozing near bladder neck and right lateral wall, controlled with button Urine crystal-clear at conclusion  EBL: 250cc   Specimens: None  Drains: 24 French two-way Foley, 30 mL in balloon  Indication: Steven Barnett is a 73 y.o. patient with recent prostate biopsy who developed significant gross hematuria, retention, and sepsis after resuming Eliquis .  He failed bedside irrigation over the last 24 hours and CT confirmed significant residual clot burden within the bladder and he opted for cystoscopy and clot evacuation.  After reviewing the management options for treatment, they elected to proceed with the above surgical procedure(s). We have discussed the potential benefits and risks of the procedure, side effects of the proposed treatment, the likelihood of the patient achieving the goals of the procedure, and any potential problems that might occur during the procedure or recuperation. Informed consent has been obtained.  Description of procedure:  The patient was taken to the operating room and general anesthesia was induced. SCDs were placed for DVT prophylaxis. The patient was placed in the dorsal lithotomy position, prepped and draped in the usual sterile fashion, and preoperative antibiotics(ceftriaxone  on the floor) were administered. A preoperative time-out was performed.   A 26 French resectoscope with visual obturator was used to intubate the urethra and a normal-appearing urethra was followed proximally to the  bladder.  Prostate was moderate in size.  The bladder was full of old maroon-colored clot.  A Toomey syringe was used to irrigate through the sheath and ultimately I was able to irrigate approximately old clot out of the bladder.  Thorough cystoscopy showed no other remaining clot.  There were no bladder tumors.  There was significant erythema and edema throughout the bladder, ureteral orifices were orthotopic bilaterally.  There was some subtle oozing around the bladder neck and right lateral wall.  The bipolar button was used for hemostasis taking care to avoid the ureteral orifices.  With the bladder decompressed there was no bleeding noted and urine remained clear.  A 24 French two-way Foley passed easily into the bladder with return of clear fluid.  30 mL were placed in the balloon.  Catheter was irrigated and remained crystal-clear.  Disposition: Stable to PACU  Plan: -Recheck H/H tonight, transfuse if needed -Continue Foley to drainage, consider voiding trial Monday -Continue to hold anticoagulation -Continue antibiotics and narrow as able pending blood cultures  Redell Burnet, MD

## 2024-06-15 NOTE — Anesthesia Postprocedure Evaluation (Signed)
 Anesthesia Post Note  Patient: Steven Barnett  Procedure(s) Performed: CYSTOSCOPY     Patient location during evaluation: PACU Anesthesia Type: General Level of consciousness: awake and alert Pain management: pain level controlled Vital Signs Assessment: post-procedure vital signs reviewed and stable Respiratory status: spontaneous breathing, nonlabored ventilation, respiratory function stable and patient connected to nasal cannula oxygen Cardiovascular status: blood pressure returned to baseline and stable Postop Assessment: no apparent nausea or vomiting Anesthetic complications: no   No notable events documented.  Last Vitals:  Vitals:   06/15/24 1745 06/15/24 1758  BP: (!) 98/56 (!) 105/57  Pulse: 95 89  Resp: 16 15  Temp:  36.7 C  SpO2: 97% 93%    Last Pain:  Vitals:   06/15/24 1758  TempSrc:   PainSc: 0-No pain                 Cordella P Eathan Groman

## 2024-06-15 NOTE — Progress Notes (Signed)
 Returned from PACU  06/15/24 1816  Vitals  Temp 98.1 F (36.7 C)  Temp Source Oral  BP 100/66  MAP (mmHg) 77  BP Location Right Arm  BP Method Automatic  Patient Position (if appropriate) Lying  Pulse Rate 93  Pulse Rate Source Monitor  ECG Heart Rate 99  Resp 20  Level of Consciousness  Level of Consciousness Alert  MEWS COLOR  MEWS Score Color Green  Oxygen Therapy  SpO2 99 %  O2 Device Room Air  Pain Assessment  Pain Scale 0-10  Pain Score 0  MEWS Score  MEWS Temp 0  MEWS Systolic 1  MEWS Pulse 0  MEWS RR 0  MEWS LOC 0  MEWS Score 1

## 2024-06-15 NOTE — Anesthesia Procedure Notes (Signed)
 Procedure Name: LMA Insertion Date/Time: 06/15/2024 4:52 PM  Performed by: Claudene Hoy CROME, CRNAPre-anesthesia Checklist: Patient identified, Emergency Drugs available, Suction available and Patient being monitored Patient Re-evaluated:Patient Re-evaluated prior to induction Oxygen Delivery Method: Circle System Utilized Preoxygenation: Pre-oxygenation with 100% oxygen Induction Type: IV induction Ventilation: Mask ventilation without difficulty LMA: LMA inserted LMA Size: 4.0 Number of attempts: 1 Placement Confirmation: positive ETCO2 Tube secured with: Tape Dental Injury: Teeth and Oropharynx as per pre-operative assessment

## 2024-06-15 NOTE — Transfer of Care (Signed)
 Immediate Anesthesia Transfer of Care Note  Patient: Steven Barnett  Procedure(s) Performed: CYSTOSCOPY  Patient Location: PACU  Anesthesia Type:General  Level of Consciousness: drowsy and patient cooperative  Airway & Oxygen Therapy: Patient Spontanous Breathing  Post-op Assessment: Report given to RN and Post -op Vital signs reviewed and stable  Post vital signs: Reviewed and stable  Last Vitals:  Vitals Value Taken Time  BP 100/69 06/15/24 17:28  Temp    Pulse 107 06/15/24 17:29  Resp 18 06/15/24 17:29  SpO2 94 % 06/15/24 17:29  Vitals shown include unfiled device data.  Last Pain:  Vitals:   06/15/24 1625  TempSrc:   PainSc: 0-No pain         Complications: No notable events documented.

## 2024-06-15 NOTE — Anesthesia Preprocedure Evaluation (Addendum)
 Anesthesia Evaluation  Patient identified by MRN, date of birth, ID band Patient awake    Reviewed: Allergy & Precautions, NPO status , Patient's Chart, lab work & pertinent test results  Airway Mallampati: II  TM Distance: >3 FB Neck ROM: Full    Dental no notable dental hx.    Pulmonary sleep apnea , former smoker   Pulmonary exam normal        Cardiovascular hypertension, Pt. on medications +CHF  + dysrhythmias Atrial Fibrillation  Rhythm:Regular Rate:Normal  ECHO:  1. Left ventricular ejection fraction, by estimation, is 60 to 65%. The  left ventricle has normal function. Left ventricular endocardial border  not optimally defined to evaluate regional wall motion. There is mild  concentric left ventricular  hypertrophy. Left ventricular diastolic function could not be evaluated.   2. Right ventricular systolic function is normal. The right ventricular  size is normal. There is normal pulmonary artery systolic pressure. The  estimated right ventricular systolic pressure is 29.2 mmHg.   3. Left atrial size was mildly dilated.   4. The mitral valve is grossly normal. No evidence of mitral valve  regurgitation. No evidence of mitral stenosis.   5. The aortic valve is tricuspid. Aortic valve regurgitation is not  visualized. No aortic stenosis is present.   6. The inferior vena cava is dilated in size with >50% respiratory  variability, suggesting right atrial pressure of 8 mmHg.   Comparison(s): No significant change from prior study.   Conclusion(s)/Recommendation(s): No intracardiac source of embolism  detected on this transthoracic study. Consider a transesophageal  echocardiogram to exclude cardiac source of embolism if clinically  indicated.     Neuro/Psych Seizures -,   negative psych ROS   GI/Hepatic negative GI ROS, Neg liver ROS,,,  Endo/Other  negative endocrine ROS    Renal/GU   negative genitourinary    Musculoskeletal  (+) Arthritis , Osteoarthritis,    Abdominal Normal abdominal exam  (+)   Peds  Hematology  (+) Blood dyscrasia, anemia Lab Results      Component                Value               Date                      WBC                      32.0 (H)            06/15/2024                HGB                      9.7 (L)             06/15/2024                HCT                      29.2 (L)            06/15/2024                MCV                      83.2                06/15/2024  PLT                      131 (L)             06/15/2024             Lab Results      Component                Value               Date                      NA                       132 (L)             06/15/2024                K                        4.9                 06/15/2024                CO2                      24                  06/15/2024                GLUCOSE                  100 (H)             06/15/2024                BUN                      38 (H)              06/15/2024                CREATININE               1.81 (H)            06/15/2024                CALCIUM                   7.4 (L)             06/15/2024                GFR                      66.55               12/05/2023                EGFR                     73                  11/30/2022                GFRNONAA                 39 (L)  06/15/2024              Anesthesia Other Findings   Reproductive/Obstetrics                              Anesthesia Physical Anesthesia Plan  ASA: 3  Anesthesia Plan: General   Post-op Pain Management:    Induction: Intravenous  PONV Risk Score and Plan: 2 and Ondansetron , Dexamethasone  and Treatment may vary due to age or medical condition  Airway Management Planned: Mask and LMA  Additional Equipment: None  Intra-op Plan:   Post-operative Plan: Extubation in OR  Informed Consent: I have reviewed the patients  History and Physical, chart, labs and discussed the procedure including the risks, benefits and alternatives for the proposed anesthesia with the patient or authorized representative who has indicated his/her understanding and acceptance.     Dental advisory given  Plan Discussed with: CRNA  Anesthesia Plan Comments:          Anesthesia Quick Evaluation

## 2024-06-15 NOTE — Interval H&P Note (Signed)
 UROLOGY H&P UPDATE  Agree with prior H&P dated 06/13/2024.  Status post prostate biopsy 06/07/2024 for significantly elevated PSA greater than 30, prostate measured 70 g, biopsy ultimately was negative.  Developed gross hematuria and clot retention after starting Eliquis , and has failed manual bladder irrigation and CBI, CT confirms large formed residual clot within the bladder.  AKI in the setting of incomplete bladder emptying.  Also with likely sepsis from urinary source with positive blood cultures and on broad-spectrum antibiotics.  Cardiac: RRR Lungs: CTA bilaterally  Laterality: n/a Procedure: Cystoscopy, clot evacuation, possible fulguration   Informed consent obtained, we specifically discussed the risks of bleeding, infection, bladder injury, need for ongoing Foley catheter for at least 1 to 2 days, post-operative pain, possible need for additional procedures.  Steven JAYSON Burnet, MD 06/15/2024

## 2024-06-15 NOTE — Progress Notes (Signed)
° °  Subjective Problems with CBI not draining overnight, uncomfortable this morning Blood cultures positive  Physical Exam: BP 116/72 (BP Location: Left Arm)   Pulse 95   Temp 98.2 F (36.8 C) (Oral)   Resp 19   Ht 5' 10 (1.778 m)   Wt 117.8 kg   SpO2 94%   BMI 37.26 kg/m    Constitutional: Appears uncomfortable Respiratory: Normal respiratory effort, no increased work of breathing. GU: CBI running medium on my arrival with pink urine in the tubing  Bladder irrigation:  Catheter did not irrigate easily, high concern for significant residual clot burden within the bladder.  I used a 5:1 ratio of saline and hydrogen peroxide instilled into the bladder and allowed to dwell for 3 to 5 minutes, then irrigated with saline.  This was repeated multiple times and I was able to decompress the bladder and get a most maroon-colored urine out.  There was no significant clot return.  Catheter still did not irrigate easily, and high concern for some residual organized clot within the bladder with that cannot be irrigated free.  Able to instill saline and gently pull back, but does not irrigate easily.  I reconnected CBI at a very slow drip and urine was essentially clear in the outflow.  Laboratory Data: Reviewed in epic Creatinine 1.81 from 1.91 yesterday, baseline is normal WBC 32 from 30 yesterday Hematocrit 29 from 34, baseline normal at 51   Assessment & Plan:   73 year old male with significant PSA elevation >30 and PI-RADS 3 lesion on MRI, prostate volume 71 g, history of A-fib on anticoagulation with Eliquis .  Underwent prostate biopsy with Dr. Devere 06/07/2024.  Presented 06/13/2024 with gross hematuria and difficulty urinating after starting Eliquis  and developing gross hematuria.  Clinical picture consistent with post-biopsy sepsis with positive blood cultures as well as gross hematuria/clot retention.  He was initially irrigated 12/11 and 12/12 by Dr. Nieves with a 24 French  three-way foley.   Overnight have had problems with CBI and catheter not draining well.  Extensive bedside irrigation performed this morning including hydrogen peroxide and was able to drain bladder and patient comfortable, but catheter does still not irrigate easily and concern for residual well-formed clot in the bladder that could require cystoscopy and clot evacuation.  Based on poor draining catheter, significant leukocytosis and significant drop in hematocrit from admission, recommended CT without contrast to evaluate for hydronephrosis, residual bladder clot burden.  Please keep n.p.o. and will reassess this afternoon for possible need for cystoscopy/clot evacuation  Continue to hold blood thinners Continue broad-spectrum antibiotics and narrow as able Continue Flomax  and finasteride   Greater than 75 minutes were spent on the floor with greater than 50% spent face-to-face with the patient regarding counseling for infection and gross hematuria after prostate biopsy, concern for residual clot burden, bladder irrigation, need for further imaging and potentially OR for cystoscopy and clot evacuation  Redell Burnet, MD 06/15/2024

## 2024-06-15 NOTE — Plan of Care (Signed)

## 2024-06-15 NOTE — Progress Notes (Signed)
 UROLOGY UPDATE NOTE  CT reviewed with significant residual old formed clot, ~8cm in size. Bladder otherwise decompressed, no hydronephrosis, no evidence of bladder perforation.  I recommended OR today for cystoscopy, clot evacuation, fulguration of any residual bleeding.  Urine in tubing is clear and suspect this is primarily old well-formed clot that cannot be irrigated free.  Unfortunately, high risk for recurrent clot retention/catheter blockage/failed voiding trial without intervention.  We discussed the risks and benefits including bleeding, infection, bladder injury, need for additional procedures.  Informed consent was obtained.  I also discussed procedure over the phone with his daughter Delon, and we will update her postop.  Keep n.p.o. OR this afternoon for cystoscopy, clot evacuation, fulguration  Redell Burnet, MD 06/15/2024

## 2024-06-16 ENCOUNTER — Encounter (HOSPITAL_COMMUNITY): Payer: Self-pay | Admitting: Urology

## 2024-06-16 ENCOUNTER — Other Ambulatory Visit (HOSPITAL_COMMUNITY): Payer: Self-pay

## 2024-06-16 LAB — CBC
HCT: 25.4 % — ABNORMAL LOW (ref 39.0–52.0)
Hemoglobin: 8.5 g/dL — ABNORMAL LOW (ref 13.0–17.0)
MCH: 28.1 pg (ref 26.0–34.0)
MCHC: 33.5 g/dL (ref 30.0–36.0)
MCV: 83.8 fL (ref 80.0–100.0)
Platelets: 142 K/uL — ABNORMAL LOW (ref 150–400)
RBC: 3.03 MIL/uL — ABNORMAL LOW (ref 4.22–5.81)
RDW: 14.4 % (ref 11.5–15.5)
WBC: 21.3 K/uL — ABNORMAL HIGH (ref 4.0–10.5)
nRBC: 0.1 % (ref 0.0–0.2)

## 2024-06-16 LAB — COMPREHENSIVE METABOLIC PANEL WITH GFR
ALT: 25 U/L (ref 0–44)
AST: 31 U/L (ref 15–41)
Albumin: 2.1 g/dL — ABNORMAL LOW (ref 3.5–5.0)
Alkaline Phosphatase: 52 U/L (ref 38–126)
Anion gap: 9 (ref 5–15)
BUN: 34 mg/dL — ABNORMAL HIGH (ref 8–23)
CO2: 25 mmol/L (ref 22–32)
Calcium: 7.5 mg/dL — ABNORMAL LOW (ref 8.9–10.3)
Chloride: 99 mmol/L (ref 98–111)
Creatinine, Ser: 1.19 mg/dL (ref 0.61–1.24)
GFR, Estimated: >60 mL/min (ref >60)
Glucose, Bld: 125 mg/dL — ABNORMAL HIGH (ref 70–99)
Potassium: 4.6 mmol/L (ref 3.5–5.1)
Sodium: 133 mmol/L — ABNORMAL LOW (ref 135–145)
Total Bilirubin: 0.6 mg/dL (ref 0.0–1.2)
Total Protein: 5.2 g/dL — ABNORMAL LOW (ref 6.5–8.1)

## 2024-06-16 LAB — CULTURE, BLOOD (ROUTINE X 2)
Special Requests: ADEQUATE
Special Requests: ADEQUATE

## 2024-06-16 NOTE — Plan of Care (Signed)

## 2024-06-16 NOTE — Progress Notes (Signed)
° °  Subjective Doing well after cystoscopy clot Evac yesterday, urine has been yellow, no Foley problems Feels significantly improved today  Physical Exam: BP 100/79 (BP Location: Right Arm)   Pulse 99   Temp 97.7 F (36.5 C) (Oral)   Resp 20   Ht 5' 10 (1.778 m)   Wt 115.7 kg   SpO2 94%   BMI 36.59 kg/m    Constitutional: Appears uncomfortable Respiratory: Normal respiratory effort, no increased work of breathing. GU: Urine clear yellow   Laboratory Data: Reviewed in epic Renal function normalized, hematocrit stable  Assessment & Plan:   73 year old male with significant PSA elevation >30 and PI-RADS 3 lesion on MRI, prostate volume 71 g, history of A-fib on anticoagulation with Eliquis .  Underwent prostate biopsy with Dr. Devere 06/07/2024.  Presented 06/13/2024 with gross hematuria and difficulty urinating after starting Eliquis  and developing gross hematuria.  Clinical picture consistent with post-biopsy sepsis with positive blood cultures as well as gross hematuria/clot retention.  He was initially irrigated 12/11 and 12/12 by Dr. Nieves with a 24 French three-way foley.  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  Urine has been clear yellow since that time.  Continue Foley to drainage, will consider voiding trial tomorrow Continue to hold blood thinners Continue broad-spectrum antibiotics and narrow as able Continue Flomax  and finasteride    Steven Burnet, MD 06/16/2024

## 2024-06-16 NOTE — Progress Notes (Signed)
 PROGRESS NOTE        PATIENT DETAILS Name: Steven Barnett Age: 73 y.o. Sex: male Date of Birth: 06/29/1951 Admit Date: 06/13/2024 Admitting Physician Maximino DELENA Sharps, MD ERE:[id number], Leita Repine, FNP (Inactive)  Brief Summary: Patient is a 73 y.o.  male with history of A-fib on Eliquis -who recently underwent prostate biopsy on 12/5-presented with hematuria and difficulty urinating-patient was found to have urinary retention in the setting of clot colic-evaluated by urology-Foley inserted-started on continuous bladder irrigation-subsequently blood cultures positive for gram-negative bacteremia.  Significant events: 12/11>> admit to [facility]-hematuria-urinary retention-Foley placed-CBI started 12/12>> urinary retention while on CBI-no output obtained in spite of frequent flushes-urology consulted-new French 24 hematuria catheter placed-800 cc of bloody urine obtained.  Significant studies: 12/11>> CXR: No acute cardiopulmonary findings 12/11>> CT head: No acute intracranial process  Significant microbiology data: 12/11>> COVID PCR: Negative 12/11>> blood culture: E. coli  Procedures: Underwent prostate biopsy with Dr. Devere 06/07/2024.  Presented 06/13/2024 with gross hematuria and difficulty urinating after starting Eliquis  and developing gross hematuria.   He was initially irrigated 12/11 and 12/12 by Dr. Nieves with a 24 French three-way foley.    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     Consults: Urology  Subjective: Patient in bed, appears comfortable, denies any headache, no fever, no chest pain or pressure, no shortness of breath , no abdominal pain. No focal weakness.  Objective: Vitals: Blood pressure 100/79, pulse 99, temperature 97.7 F (36.5 C), temperature source Oral, resp. rate 20, height 5' 10 (1.778 m), weight 115.7 kg, SpO2 94%.   Exam:  Awake Alert, No new F.N  deficits, Normal affect [place].AT,PERRAL Supple Neck, No JVD,   Symmetrical Chest wall movement, Good air movement bilaterally, CTAB RRR,No Gallops, Rubs or new Murmurs,  +ve B.Sounds, Abd Soft, No tenderness, Foley in place No Cyanosis, Clubbing or edema     Assessment/Plan:  Gross hematuria with clot retention leading to acute urinary retention (in the setting of recent prostate biopsy/Eliquis  use) S/p Foley catheter insertion/CBI-and TXA on 12/11-unfortunately even while on CBI-redeveloped clot retention on 12/12-Foley catheter was subsequently exchanged-per urology-although urine is pink-tinged today-he subsequently underwent cystoscopy on 06/15/2024 by urology, clear urine now, continue to monitor for any output Continue to hold Eliquis  given severity/recurrent clot retention this morning Continue Flomax /finasteride  Continue CBI Defer further to urology service.  Severe sepsis with hypotension secondary to E. coli bacteremia (POA) Likely result of above/recent prostate biopsy On IV Rocephin  Was hypotensive on 12/12 requiring numerous fluid boluses and initiation of midodrine  BP has now stabilized-however continues to have persistent leukocytosis-concern that they may be clot retention again-awaiting CT abdomen study. Continue IV Rocephin   AKI Probably secondary to obstructive uropathy due to urinary retention-and some amount of hemodynamic injury in the setting of sepsis Renal function improving Avoid nephrotoxic agents  Acute metabolic encephalopathy Likely secondary to bacteremia/AKI-seems to have resolved-patient is relatively awake and alert this morning.  ?  Hypoxia Not certain if this is a true pulse ox reading No evidence of PNA or CHF clinically-on Eliquis -so doubt VTE Will attempt to titrate oxygen and see how he does  History of chronic HFpEF Euvolemic Due to hypotension-hold all diuretics  HTN Hold Cardizem -hypotensive this morning  History of ICH 2023 In  the setting of Coumadin  use-has been switched to Eliquis  but currently on  hold Supportive care  OSA Monitor-start CPAP if needed.  Class II obesity: Estimated body mass index is 36.59 kg/m as calculated from the following:   Height as of this encounter: 5' 10 (1.778 m).   Weight as of this encounter: 115.7 kg.    Code status:   Code Status: Full Code   DVT Prophylaxis:SCD's-given severe verity of hematuria causing recurrent clot retention/urinary retention Eliquis  remains on hold  Family Communication: None at bedside   Disposition Plan: Status is: Inpatient Remains inpatient appropriate because: Severity of illness   Planned Discharge Destination:Home   Diet: Diet Order             Diet Heart Room service appropriate? Yes; Fluid consistency: Thin  Diet effective now                     Antimicrobial agents: Anti-infectives (From admission, onward)    Start     Dose/Rate Route Frequency Ordered Stop   06/14/24 1400  cefTRIAXone  (ROCEPHIN ) 2 g in sodium chloride  0.9 % 100 mL IVPB        2 g 200 mL/hr over 30 Minutes Intravenous Every 24 hours 06/13/24 1606     06/14/24 0215  cefTRIAXone  (ROCEPHIN ) 1 g in sodium chloride  0.9 % 100 mL IVPB       Note to Pharmacy: Please give in addition to 1g dose give earlier to complete 2g total for bacteremia   1 g 200 mL/hr over 30 Minutes Intravenous  Once 06/14/24 0206 06/14/24 0257   06/13/24 1330  cefTRIAXone  (ROCEPHIN ) 1 g in sodium chloride  0.9 % 100 mL IVPB  Status:  Discontinued        1 g 200 mL/hr over 30 Minutes Intravenous  Once 06/13/24 1325 06/13/24 1931   06/13/24 1330  azithromycin  (ZITHROMAX ) 500 mg in sodium chloride  0.9 % 250 mL IVPB        500 mg 250 mL/hr over 60 Minutes Intravenous  Once 06/13/24 1325 06/13/24 1511        MEDICATIONS: Scheduled Meds:  allopurinol   100 mg Oral Daily   Chlorhexidine  Gluconate Cloth  6 each Topical Daily   finasteride   5 mg Oral Daily   midodrine   10 mg Oral  TID WC   sodium chloride  flush  3 mL Intravenous Q12H   tamsulosin   0.4 mg Oral QPC supper   Continuous Infusions:  cefTRIAXone  (ROCEPHIN )  IV 2 g (06/15/24 1309)   lactated ringers  500 mL/hr at 06/14/24 1352   lactated ringers  Stopped (06/14/24 1715)   PRN Meds:.acetaminophen  **OR** acetaminophen , albuterol    I have personally reviewed following labs and imaging studies  LABORATORY DATA: CBC: Recent Labs  Lab 06/13/24 1128 06/14/24 0337 06/14/24 1357 06/15/24 0442 06/15/24 1845 06/16/24 0428  WBC 11.2* 30.6* 30.5* 32.0* 25.2* 21.3*  NEUTROABS 8.7*  --   --   --   --   --   HGB 16.6 13.6 11.4* 9.7* 8.5* 8.5*  HCT 51.2 41.6 34.7* 29.2* 26.2* 25.4*  MCV 85.2 83.5 84.8 83.2 83.2 83.8  PLT 174 121* 126* 131* 124* 142*    Basic Metabolic Panel: Recent Labs  Lab 06/13/24 1128 06/14/24 0337 06/15/24 0442 06/16/24 0428  NA 136 135 132* 133*  K 4.1 4.3 4.9 4.6  CL 100 105 99 99  CO2 25 19* 24 25  GLUCOSE 131* 140* 100* 125*  BUN 16 21 38* 34*  CREATININE 1.39* 1.91* 1.81* 1.19  CALCIUM  8.5* 7.8* 7.4* 7.5*  GFR: Estimated Creatinine Clearance: 71.5 mL/min (by C-G formula based on SCr of 1.19 mg/dL).  Liver Function Tests: Recent Labs  Lab 06/13/24 1128 06/15/24 0442 06/16/24 0428  AST 27 34 31  ALT 15 21 25   ALKPHOS 90 54 52  BILITOT 2.3* 0.7 0.6  PROT 7.4 5.0* 5.2*  ALBUMIN 3.3* 2.1* 2.1*   No results for input(s): LIPASE, AMYLASE in the last 168 hours. No results for input(s): AMMONIA in the last 168 hours.  Coagulation Profile: Recent Labs  Lab 06/13/24 1128  INR 1.3*    Cardiac Enzymes: No results for input(s): CKTOTAL, CKMB, CKMBINDEX, TROPONINI in the last 168 hours.  BNP (last 3 results) No results for input(s): PROBNP in the last 8760 hours.  Lipid Profile: No results for input(s): CHOL, HDL, LDLCALC, TRIG, CHOLHDL, LDLDIRECT in the last 72 hours.  Thyroid Function Tests: No results for input(s): TSH,  T4TOTAL, FREET4, T3FREE, THYROIDAB in the last 72 hours.  Anemia Panel: No results for input(s): VITAMINB12, FOLATE, FERRITIN, TIBC, IRON, RETICCTPCT in the last 72 hours.  Urine analysis:    Component Value Date/Time   COLORURINE RED (A) 06/13/2024 1625   APPEARANCEUR TURBID (A) 06/13/2024 1625   LABSPEC  06/13/2024 1625    TEST NOT REPORTED DUE TO COLOR INTERFERENCE OF URINE PIGMENT   PHURINE  06/13/2024 1625    TEST NOT REPORTED DUE TO COLOR INTERFERENCE OF URINE PIGMENT   GLUCOSEU (A) 06/13/2024 1625    TEST NOT REPORTED DUE TO COLOR INTERFERENCE OF URINE PIGMENT   HGBUR (A) 06/13/2024 1625    TEST NOT REPORTED DUE TO COLOR INTERFERENCE OF URINE PIGMENT   BILIRUBINUR (A) 06/13/2024 1625    TEST NOT REPORTED DUE TO COLOR INTERFERENCE OF URINE PIGMENT   KETONESUR NEGATIVE 06/13/2024 1625   PROTEINUR (A) 06/13/2024 1625    TEST NOT REPORTED DUE TO COLOR INTERFERENCE OF URINE PIGMENT   UROBILINOGEN 2.0 (H) 08/14/2020 1735   NITRITE (A) 06/13/2024 1625    TEST NOT REPORTED DUE TO COLOR INTERFERENCE OF URINE PIGMENT   LEUKOCYTESUR (A) 06/13/2024 1625    TEST NOT REPORTED DUE TO COLOR INTERFERENCE OF URINE PIGMENT    Sepsis Labs: Lactic Acid, Venous    Component Value Date/Time   LATICACIDVEN 2.2 (HH) 06/14/2024 1648    MICROBIOLOGY: Recent Results (from the past 240 hours)  Blood Culture (routine x 2)     Status: Abnormal (Preliminary result)   Collection Time: 06/13/24 11:50 AM   Specimen: BLOOD  Result Value Ref Range Status   Specimen Description BLOOD RIGHT ANTECUBITAL  Final   Special Requests   Final    BOTTLES DRAWN AEROBIC AND ANAEROBIC Blood Culture adequate volume   Culture  Setup Time   Final    GRAM NEGATIVE RODS IN BOTH AEROBIC AND ANAEROBIC BOTTLES CRITICAL VALUE NOTED.  VALUE IS CONSISTENT WITH PREVIOUSLY REPORTED AND CALLED VALUE.    Culture (A)  Final    ESCHERICHIA COLI SUSCEPTIBILITIES PERFORMED ON PREVIOUS CULTURE WITHIN THE  LAST 5 DAYS. Performed at [facility], [address] [place], [place] [address]    Report Status PENDING  Incomplete  Blood Culture (routine x 2)     Status: Abnormal   Collection Time: 06/13/24 12:18 PM   Specimen: BLOOD LEFT HAND  Result Value Ref Range Status   Specimen Description BLOOD LEFT HAND  Final   Special Requests   Final    BOTTLES DRAWN AEROBIC AND ANAEROBIC Blood Culture adequate volume  Culture  Setup Time   Final    GRAM NEGATIVE RODS IN BOTH AEROBIC AND ANAEROBIC BOTTLES CRITICAL RESULT CALLED TO, READ BACK BY AND VERIFIED WITH: PHARMD  J LEDFORD 06/04/2024 @ 0140 BY AB Performed at [facility], [address] [place], [place] [address]    Culture ESCHERICHIA COLI (A)  Final   Report Status 06/16/2024 FINAL  Final   Organism ID, Bacteria ESCHERICHIA COLI  Final      Susceptibility   Escherichia coli - MIC*    AMPICILLIN 16 INTERMEDIATE Intermediate     CEFAZOLIN  (NON-URINE) 16 RESISTANT Resistant     CEFEPIME  <=0.12 SENSITIVE Sensitive     ERTAPENEM <=0.12 SENSITIVE Sensitive     CEFTRIAXONE  1 SENSITIVE Sensitive     CIPROFLOXACIN  <=0.06 SENSITIVE Sensitive     GENTAMICIN  <=1 SENSITIVE Sensitive     MEROPENEM <=0.25 SENSITIVE Sensitive     TRIMETH/SULFA <=20 SENSITIVE Sensitive     AMPICILLIN/SULBACTAM 4 SENSITIVE Sensitive     PIP/TAZO Value in next row Sensitive      <=4 SENSITIVEThis is a modified FDA-approved test that has been validated and its performance characteristics determined by the reporting laboratory.  This laboratory is certified under the Clinical Laboratory Improvement Amendments CLIA as qualified to perform high complexity clinical laboratory testing.    * ESCHERICHIA COLI  Blood Culture ID Panel (Reflexed)     Status: Abnormal   Collection Time: 06/13/24 12:18 PM  Result Value Ref Range Status   Enterococcus faecalis NOT DETECTED NOT DETECTED Corrected   Enterococcus Faecium NOT DETECTED NOT DETECTED Corrected   Listeria  monocytogenes NOT DETECTED NOT DETECTED Corrected   Staphylococcus species NOT DETECTED NOT DETECTED Corrected   Staphylococcus aureus (BCID) NOT DETECTED NOT DETECTED Corrected   Staphylococcus epidermidis NOT DETECTED NOT DETECTED Corrected   Staphylococcus lugdunensis NOT DETECTED NOT DETECTED Corrected   Streptococcus species NOT DETECTED NOT DETECTED Corrected   Streptococcus agalactiae NOT DETECTED NOT DETECTED Corrected   Streptococcus pneumoniae NOT DETECTED NOT DETECTED Corrected   Streptococcus pyogenes NOT DETECTED NOT DETECTED Corrected   A.calcoaceticus-baumannii NOT DETECTED NOT DETECTED Corrected   Bacteroides fragilis NOT DETECTED NOT DETECTED Corrected   Enterobacterales DETECTED (A) NOT DETECTED Corrected    Comment: Enterobacterales represent a large order of gram negative bacteria, not a single organism. CRITICAL RESULT CALLED TO, READ BACK BY AND VERIFIED WITH: PHARMD  J LEDFORD 06/04/2024 @ 0140 BY AB    Enterobacter cloacae complex NOT DETECTED NOT DETECTED Corrected   Escherichia coli DETECTED (A) NOT DETECTED Corrected    Comment: CRITICAL RESULT CALLED TO, READ BACK BY AND VERIFIED WITH: PHARMD  J LEDFORD 06/04/2024 @ 0140 BY AB    Klebsiella aerogenes NOT DETECTED NOT DETECTED Corrected   Klebsiella oxytoca NOT DETECTED NOT DETECTED Corrected   Klebsiella pneumoniae NOT DETECTED NOT DETECTED Corrected   Proteus species NOT DETECTED NOT DETECTED Corrected   Salmonella species NOT DETECTED NOT DETECTED Corrected   Serratia marcescens NOT DETECTED NOT DETECTED Corrected   Haemophilus influenzae NOT DETECTED NOT DETECTED Corrected   Neisseria meningitidis NOT DETECTED NOT DETECTED Corrected   Pseudomonas aeruginosa NOT DETECTED NOT DETECTED Corrected   Stenotrophomonas maltophilia NOT DETECTED NOT DETECTED Corrected   Candida albicans NOT DETECTED NOT DETECTED Corrected   Candida auris NOT DETECTED NOT DETECTED Corrected   Candida glabrata NOT DETECTED NOT  DETECTED Corrected   Candida krusei NOT DETECTED NOT DETECTED Corrected   Candida parapsilosis NOT DETECTED NOT DETECTED Corrected  Candida tropicalis NOT DETECTED NOT DETECTED Corrected   Cryptococcus neoformans/gattii NOT DETECTED NOT DETECTED Corrected   CTX-M ESBL NOT DETECTED NOT DETECTED Corrected   Carbapenem resistance IMP NOT DETECTED NOT DETECTED Corrected   Carbapenem resistance KPC NOT DETECTED NOT DETECTED Corrected   Carbapenem resistance NDM NOT DETECTED NOT DETECTED Corrected   Carbapenem resist OXA 48 LIKE NOT DETECTED NOT DETECTED Corrected   Carbapenem resistance VIM NOT DETECTED NOT DETECTED Corrected    Comment: Performed at [facility], [address], [place], [place] [address]  Respiratory (~20 pathogens) panel by PCR     Status: None   Collection Time: 06/13/24  5:17 PM   Specimen: Nasopharyngeal Swab; Respiratory  Result Value Ref Range Status   Adenovirus NOT DETECTED NOT DETECTED Final   Coronavirus 229E NOT DETECTED NOT DETECTED Final    Comment: (NOTE) The Coronavirus on the Respiratory Panel, DOES NOT test for the novel  Coronavirus (2019 nCoV)    Coronavirus HKU1 NOT DETECTED NOT DETECTED Final   Coronavirus NL63 NOT DETECTED NOT DETECTED Final   Coronavirus OC43 NOT DETECTED NOT DETECTED Final   Metapneumovirus NOT DETECTED NOT DETECTED Final   Rhinovirus / Enterovirus NOT DETECTED NOT DETECTED Final   Influenza A NOT DETECTED NOT DETECTED Final   Influenza B NOT DETECTED NOT DETECTED Final   Parainfluenza Virus 1 NOT DETECTED NOT DETECTED Final   Parainfluenza Virus 2 NOT DETECTED NOT DETECTED Final   Parainfluenza Virus 3 NOT DETECTED NOT DETECTED Final   Parainfluenza Virus 4 NOT DETECTED NOT DETECTED Final   Respiratory Syncytial Virus NOT DETECTED NOT DETECTED Final   Bordetella pertussis NOT DETECTED NOT DETECTED Final   Bordetella Parapertussis NOT DETECTED NOT DETECTED Final   Chlamydophila pneumoniae NOT DETECTED NOT DETECTED Final    Mycoplasma pneumoniae NOT DETECTED NOT DETECTED Final    Comment: Performed at [facility], [address] [place], [place] [address]  MRSA Next Gen by PCR, Nasal     Status: None   Collection Time: 06/13/24 11:02 PM   Specimen: Nasal Mucosa; Nasal Swab  Result Value Ref Range Status   MRSA by PCR Next Gen NOT DETECTED NOT DETECTED Final    Comment: (NOTE) The GeneXpert MRSA Assay (FDA approved for NASAL specimens only), is one component of a comprehensive MRSA colonization surveillance program. It is not intended to diagnose MRSA infection nor to guide or monitor treatment for MRSA infections. Test performance is not FDA approved in patients less than 70 years old. Performed at [facility], [address], [place], [place] [address]     RADIOLOGY STUDIES/RESULTS: CT RENAL STONE STUDY Result Date: 06/15/2024 CLINICAL DATA:  Abdominal and flank pain.  Kidney stones suspected. EXAM: CT ABDOMEN AND PELVIS WITHOUT CONTRAST TECHNIQUE: Multidetector CT imaging of the abdomen and pelvis was performed following the standard protocol without IV contrast. RADIATION DOSE REDUCTION: This exam was performed according to the departmental dose-optimization program which includes automated exposure control, adjustment of the mA and/or kV according to patient size and/or use of iterative reconstruction technique. COMPARISON:  08/15/2020 FINDINGS: Lower chest: Scarring/pleural thickening at the right base is stable in the interval. Hepatobiliary: No suspicious focal abnormality in the liver on this study without intravenous contrast. There is no evidence for gallstones, gallbladder wall thickening, or pericholecystic fluid. No intrahepatic or extrahepatic biliary dilation. Pancreas: No focal mass lesion. No dilatation of the main duct. No intraparenchymal cyst. No peripancreatic edema. Spleen: No splenomegaly. No suspicious  focal mass lesion. Adrenals/Urinary Tract: No adrenal nodule or mass.  Kidneys unremarkable. No evidence for hydroureter. Foley balloon is identified in the bladder lumen. Bladder lumen is filled with high density material. Circumferential gas in the bladder wall along the mucosal surface is compatible with emphysematous cystitis. Tiny gas collections anterior to the low bladder appear to be extraluminal (see sagittal image 85 of series 7) with a linear gas collection anterior to the bladder potentially intravascular (sagittal 78/7). Gas along the right pelvic sidewall has a tubular configuration may also be intravascular (image 72/3). Stomach/Bowel: Stomach is unremarkable. No gastric wall thickening. No evidence of outlet obstruction. Duodenum is normally positioned as is the ligament of Treitz. No small bowel wall thickening. No small bowel dilatation. Right hemicolectomy. No gross colonic mass. No colonic wall thickening. Vascular/Lymphatic: There is mild atherosclerotic calcification of the abdominal aorta without aneurysm. There is no gastrohepatic or hepatoduodenal ligament lymphadenopathy. No retroperitoneal or mesenteric lymphadenopathy. No pelvic sidewall lymphadenopathy. Reproductive: Prostate gland is enlarged. Other: No free fluid in the abdomen or pelvis. Edema is identified in the soft tissues of the pelvic floor. Musculoskeletal: Left greater than right groin hernias contain only fat. No worrisome lytic or sclerotic osseous abnormality. IMPRESSION: 1. Circumferential gas in the bladder wall along the mucosal surface is compatible with emphysematous cystitis. Tiny gas collections anterior to the low bladder appear to be extraluminal. Linear/tubular gas collection seen anterior to the bladder and along the right pelvic sidewall suggest intravascular, likely venous, gas. 2. High density material in the bladder lumen is compatible with blood products/clot. 3. Prostatomegaly. 4. Left greater than right groin hernias contain only fat. 5. Aortic Atherosclerosis (ICD10-I70.0)  and Emphysema (ICD10-J43.9). These results will be called to the ordering clinician or representative by the Radiologist Assistant, and communication documented in the PACS or [employer]. Electronically Signed   By: Camellia Candle M.D.   On: 06/15/2024 12:37     LOS: 3 days   Lavada Stank, MD  [facility]    To contact the attending provider between 7A-7P or the covering provider during after hours 7P-7A, please log into the web site [email or web address] and access using universal [place] password for that web site. If you do not have the password, please call the hospital operator.  06/16/2024, 11:01 AM

## 2024-06-17 LAB — CBC WITH DIFFERENTIAL/PLATELET
Abs Immature Granulocytes: 1.61 K/uL — ABNORMAL HIGH (ref 0.00–0.07)
Basophils Absolute: 0.1 K/uL (ref 0.0–0.1)
Basophils Relative: 0 %
Eosinophils Absolute: 0 K/uL (ref 0.0–0.5)
Eosinophils Relative: 0 %
HCT: 22.8 % — ABNORMAL LOW (ref 39.0–52.0)
Hemoglobin: 7.5 g/dL — ABNORMAL LOW (ref 13.0–17.0)
Immature Granulocytes: 7 %
Lymphocytes Relative: 9 %
Lymphs Abs: 2.1 K/uL (ref 0.7–4.0)
MCH: 27.7 pg (ref 26.0–34.0)
MCHC: 32.9 g/dL (ref 30.0–36.0)
MCV: 84.1 fL (ref 80.0–100.0)
Monocytes Absolute: 1.2 K/uL — ABNORMAL HIGH (ref 0.1–1.0)
Monocytes Relative: 5 %
Neutro Abs: 19.3 K/uL — ABNORMAL HIGH (ref 1.7–7.7)
Neutrophils Relative %: 79 %
Platelets: 161 K/uL (ref 150–400)
RBC: 2.71 MIL/uL — ABNORMAL LOW (ref 4.22–5.81)
RDW: 14.5 % (ref 11.5–15.5)
Smear Review: NORMAL
WBC: 24.3 K/uL — ABNORMAL HIGH (ref 4.0–10.5)
nRBC: 0.5 % — ABNORMAL HIGH (ref 0.0–0.2)

## 2024-06-17 LAB — BASIC METABOLIC PANEL WITH GFR
Anion gap: 3 — ABNORMAL LOW (ref 5–15)
BUN: 25 mg/dL — ABNORMAL HIGH (ref 8–23)
CO2: 29 mmol/L (ref 22–32)
Calcium: 7.8 mg/dL — ABNORMAL LOW (ref 8.9–10.3)
Chloride: 104 mmol/L (ref 98–111)
Creatinine, Ser: 1.06 mg/dL (ref 0.61–1.24)
GFR, Estimated: >60 mL/min (ref >60)
Glucose, Bld: 92 mg/dL (ref 70–99)
Potassium: 4.5 mmol/L (ref 3.5–5.1)
Sodium: 136 mmol/L (ref 135–145)

## 2024-06-17 LAB — PROCALCITONIN: Procalcitonin: 9.33 ng/mL

## 2024-06-17 LAB — MAGNESIUM: Magnesium: 2.3 mg/dL (ref 1.7–2.4)

## 2024-06-17 LAB — C-REACTIVE PROTEIN: CRP: 6.1 mg/dL — ABNORMAL HIGH (ref <1.0)

## 2024-06-17 NOTE — Progress Notes (Signed)
 2 Days Post-Op Subjective: First time meeting Steven Barnett. He was having breakfast and was in good spirits. Eager to start ambulating today  Objective: Vital signs in last 24 hours: Temp:  [97.6 F (36.4 C)-97.9 F (36.6 C)] 97.6 F (36.4 C) (12/15 0819) Pulse Rate:  [84] 84 (12/14 2339) Resp:  [17-21] 17 (12/15 0819) BP: (110-152)/(63-90) 130/64 (12/15 0819) SpO2:  [97 %] 97 % (12/14 2339) Weight:  [885 kg] 114 kg (12/15 0500)  Assessment/Plan: 10m with prostate biopsy w, Dr. Devere Barnett. Hematuria and clot retention after restarting Eliquis   #gross hematuria #clot retention of urine  Clot evac and fulguration with Dr. Francisca on 12/13. Interval drop in Hgb overnight, but has had clear urine for > 24hrs. Voiding trial today.   #E.coli bacteremia Presumably 2/2 biopsy. Afebrile and hemodynamically stable with leukocytosis. Receiving Ceftriaxone   #AKI 2/2 obstructive uropathy - resolved  Intake/Output from previous day: 12/14 0701 - 12/15 0700 In: 1430 [I.V.:5; IV Piggyback:1425] Out: 1900 [Urine:1900]  Intake/Output this shift: No intake/output data recorded.  Physical Exam:  General: Alert and oriented CV: No cyanosis Lungs: equal chest rise Gu: Foley catheter in place with clear yellow urine  Lab Results: Recent Labs    06/15/24 1845 06/16/24 0428 06/17/24 0316  HGB 8.5* 8.5* 7.5*  HCT 26.2* 25.4* 22.8*   BMET Recent Labs    06/16/24 0428 06/17/24 0316  NA 133* 136  K 4.6 4.5  CL 99 104  CO2 25 29  GLUCOSE 125* 92  BUN 34* 25*  CREATININE 1.19 1.06  CALCIUM  7.5* 7.8*  HGB 8.5* 7.5*  WBC 21.3* 24.3*     Studies/Results: CT RENAL STONE STUDY Result Date: 06/15/2024 CLINICAL DATA:  Abdominal and flank pain.  Kidney stones suspected. EXAM: CT ABDOMEN AND PELVIS WITHOUT CONTRAST TECHNIQUE: Multidetector CT imaging of the abdomen and pelvis was performed following the standard protocol without IV contrast. RADIATION DOSE REDUCTION: This exam  was performed according to the departmental dose-optimization program which includes automated exposure control, adjustment of the mA and/or kV according to patient size and/or use of iterative reconstruction technique. COMPARISON:  08/15/2020 FINDINGS: Lower chest: Scarring/pleural thickening at the right base is stable in the interval. Hepatobiliary: No suspicious focal abnormality in the liver on this study without intravenous contrast. There is no evidence for gallstones, gallbladder wall thickening, or pericholecystic fluid. No intrahepatic or extrahepatic biliary dilation. Pancreas: No focal mass lesion. No dilatation of the main duct. No intraparenchymal cyst. No peripancreatic edema. Spleen: No splenomegaly. No suspicious focal mass lesion. Adrenals/Urinary Tract: No adrenal nodule or mass. Kidneys unremarkable. No evidence for hydroureter. Foley balloon is identified in the bladder lumen. Bladder lumen is filled with high density material. Circumferential gas in the bladder wall along the mucosal surface is compatible with emphysematous cystitis. Tiny gas collections anterior to the low bladder appear to be extraluminal (see sagittal image 85 of series 7) with a linear gas collection anterior to the bladder potentially intravascular (sagittal 78/7). Gas along the right pelvic sidewall has a tubular configuration may also be intravascular (image 72/3). Stomach/Bowel: Stomach is unremarkable. No gastric wall thickening. No evidence of outlet obstruction. Duodenum is normally positioned as is the ligament of Treitz. No small bowel wall thickening. No small bowel dilatation. Right hemicolectomy. No gross colonic mass. No colonic wall thickening. Vascular/Lymphatic: There is mild atherosclerotic calcification of the abdominal aorta without aneurysm. There is no gastrohepatic or hepatoduodenal ligament lymphadenopathy. No retroperitoneal or mesenteric lymphadenopathy. No pelvic sidewall lymphadenopathy.  Reproductive: Prostate gland is enlarged. Other: No free fluid in the abdomen or pelvis. Edema is identified in the soft tissues of the pelvic floor. Musculoskeletal: Left greater than right groin hernias contain only fat. No worrisome lytic or sclerotic osseous abnormality. IMPRESSION: 1. Circumferential gas in the bladder wall along the mucosal surface is compatible with emphysematous cystitis. Tiny gas collections anterior to the low bladder appear to be extraluminal. Linear/tubular gas collection seen anterior to the bladder and along the right pelvic sidewall suggest intravascular, likely venous, gas. 2. High density material in the bladder lumen is compatible with blood products/clot. 3. Prostatomegaly. 4. Left greater than right groin hernias contain only fat. 5. Aortic Atherosclerosis (ICD10-I70.0) and Emphysema (ICD10-J43.9). These results will be called to the ordering clinician or representative by the Radiologist Assistant, and communication documented in the PACS or [employer]. Electronically Signed   By: Camellia Candle M.D.   On: 06/15/2024 12:37      LOS: 4 days   Steven Bourdon, NP [facility] Pager: [phone]  06/17/2024, 9:26 AM

## 2024-06-17 NOTE — Evaluation (Signed)
 Physical Therapy Evaluation Patient Details Name: Steven Barnett MRN: [id number] DOB: 1951/02/03 Today's Date: 06/17/2024  History of Present Illness  Patient is a 73 y/o  male admitted 06/13/24 with hematuria, abdominal pain following recent prostate biopsy.  Patient underwent CBI though ended up needing cystoscopy for clot evacuation, fulguration on 06/15/24.  PMH positive for CHF, a-fib on anticoagulation, h/o ICH, CKD, OSA, HTN morbid obesity.  Clinical Impression  Patient presents with decreased mobility due to generalized weakness, decreased balance and decreased activity tolerance.  Continues with foley which feels uncomfortable.  Able to stand with UE support and walk with walker with S in the hallway with HR up to 141, though improved and walked 2 laps with HR maintained in 120's.  PT will continue to follow and anticipate stair practice prior to d/c home with HHPT follow up.        If plan is discharge home, recommend the following: A little help with walking and/or transfers;Help with stairs or ramp for entrance;A little help with bathing/dressing/bathroom   Can travel by private vehicle        Equipment Recommendations None recommended by PT  Recommendations for Other Services       Functional Status Assessment Patient has had a recent decline in their functional status and demonstrates the ability to make significant improvements in function in a reasonable and predictable amount of time.     Precautions / Restrictions Precautions Precautions: Fall      Mobility  Bed Mobility               General bed mobility comments: in recliner    Transfers Overall transfer level: Needs assistance Equipment used: Rolling walker (2 wheels), None Transfers: Sit to/from Stand Sit to Stand: Supervision           General transfer comment: much increased time for sit to stand and stand to sit with heavy UE support, initial standing reaching to window sill as no device  in the room, obtained RW for use    Ambulation/Gait Ambulation/Gait assistance: Supervision Gait Distance (Feet): 300 Feet Assistive device: Rolling walker (2 wheels) Gait Pattern/deviations: Step-through pattern, Decreased stride length, Shuffle, Wide base of support       General Gait Details: mild L > R decreased foot clearance, managing with RW and no physical help in hallway, HR to 141 max, maintained in 120's during ambulation  Stairs            Wheelchair Mobility     Tilt Bed    Modified Rankin (Stroke Patients Only)       Balance Overall balance assessment: Needs assistance   Sitting balance-Leahy Scale: Good       Standing balance-Leahy Scale: Fair Standing balance comment: static standing no device, though walker helpful for ambulation                             Pertinent Vitals/Pain Pain Assessment Pain Assessment: Faces Faces Pain Scale: Hurts a little bit Pain Location: catheter Pain Descriptors / Indicators: Discomfort Pain Intervention(s): Monitored during session    Home Living Family/patient expects to be discharged to:: Private residence Living Arrangements: Children Available Help at Discharge: Family;Available PRN/intermittently Type of Home: House Home Access: Stairs to enter   Entrance Stairs-Number of Steps: 1 Alternate Level Stairs-Number of Steps: 15 Home Layout: Two level Home Equipment: [employer] (2 wheels);[place] - single point      Prior Function Prior  Level of Function : Independent/Modified Independent             Mobility Comments: usually no device ADLs Comments: independent, daughter and sister help with meals     Extremity/Trunk Assessment   Upper Extremity Assessment Upper Extremity Assessment: Overall WFL for tasks assessed    Lower Extremity Assessment Lower Extremity Assessment: Generalized weakness       Communication   Communication Communication: No apparent  difficulties    Cognition Arousal: Alert Behavior During Therapy: WFL for tasks assessed/performed   PT - Cognitive impairments: No apparent impairments                         Following commands: Intact       Cueing       General Comments General comments (skin integrity, edema, etc.): Discussed nephews in to help at d/c as daughter works; HR up to 130's/140 with ambulation    Exercises     Assessment/Plan    PT Assessment Patient needs continued PT services  PT Problem List Decreased activity tolerance;Decreased balance;Decreased mobility;Decreased knowledge of use of DME       PT Treatment Interventions DME instruction;Gait training;Stair training;Therapeutic activities;Therapeutic exercise;Balance training;Functional mobility training    PT Goals (Current goals can be found in the Care Plan section)  Acute Rehab PT Goals Patient Stated Goal: return to independent PT Goal Formulation: With patient Time For Goal Achievement: 07/01/24 Potential to Achieve Goals: Good    Frequency Min 2X/week     Co-evaluation               AM-PAC PT 6 Clicks Mobility  Outcome Measure Help needed turning from your back to your side while in a flat bed without using bedrails?: None Help needed moving from lying on your back to sitting on the side of a flat bed without using bedrails?: A Little Help needed moving to and from a bed to a chair (including a wheelchair)?: A Little Help needed standing up from a chair using your arms (e.g., wheelchair or bedside chair)?: None Help needed to walk in hospital room?: None Help needed climbing 3-5 steps with a railing? : A Little 6 Click Score: 21    End of Session Equipment Utilized During Treatment: Gait belt Activity Tolerance: Patient limited by fatigue Patient left: with call bell/phone within reach;in chair Nurse Communication: Mobility status (needs to walk again in hallway with assistance) PT Visit Diagnosis:  Muscle weakness (generalized) (M62.81);Other abnormalities of gait and mobility (R26.89)    Time: 8859-8784 PT Time Calculation (min) (ACUTE ONLY): 35 min   Charges:   PT Evaluation $PT Eval Moderate Complexity: 1 Mod PT Treatments $Gait Training: 8-22 mins PT General Charges $$ ACUTE PT VISIT: 1 Visit         Micheline Portal, PT Acute Rehabilitation Services Office:[phone] 06/17/2024   Montie Portal 06/17/2024, 12:53 PM

## 2024-06-17 NOTE — Progress Notes (Signed)
 PROGRESS NOTE        PATIENT DETAILS Name: Steven Barnett Age: 73 y.o. Sex: male Date of Birth: 07-16-1950 Admit Date: 06/13/2024 Admitting Physician Maximino DELENA Sharps, MD ERE:[id number], Leita Repine, FNP (Inactive)  Brief Summary: Patient is a 73 y.o.  male with history of A-fib on Eliquis -who recently underwent prostate biopsy on 12/5-presented with hematuria and difficulty urinating-patient was found to have urinary retention in the setting of clot colic-evaluated by urology-Foley inserted-started on continuous bladder irrigation-subsequently blood cultures positive for gram-negative bacteremia.  Significant events: 12/11>> admit to [facility]-hematuria-urinary retention-Foley placed-CBI started 12/12>> urinary retention while on CBI-no output obtained in spite of frequent flushes-urology consulted-new French 24 hematuria catheter placed-800 cc of bloody urine obtained.  Significant studies: 12/11>> CXR: No acute cardiopulmonary findings 12/11>> CT head: No acute intracranial process  Significant microbiology data: 12/11>> COVID PCR: Negative 12/11>> blood culture: E. coli  Procedures: Underwent prostate biopsy with Dr. Devere 06/07/2024.  Presented 06/13/2024 with gross hematuria and difficulty urinating after starting Eliquis  and developing gross hematuria.   He was initially irrigated 12/11 and 12/12 by Dr. Nieves with a 24 French three-way foley.    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     Consults: Urology  Subjective:  Patient in bed, appears comfortable, denies any headache, no fever, no chest pain or pressure, no shortness of breath , no abdominal pain. No new focal weakness.   Objective: Vitals: Blood pressure 130/64, pulse 84, temperature 97.6 F (36.4 C), temperature source Oral, resp. rate 17, height 5' 10 (1.778 m), weight 114 kg, SpO2 97%.   Exam:  Awake Alert, No new  F.N deficits, Normal affect [place].AT,PERRAL Supple Neck, No JVD,   Symmetrical Chest wall movement, Good air movement bilaterally, CTAB RRR,No Gallops, Rubs or new Murmurs,  +ve B.Sounds, Abd Soft, No tenderness, Foley in place No Cyanosis, Clubbing or edema     Assessment/Plan:  Gross hematuria with clot retention leading to acute urinary retention (in the setting of recent prostate biopsy/Eliquis  use) acute blood loss related anemia. S/p Foley catheter insertion/CBI-and TXA on 12/11-unfortunately even while on CBI-redeveloped clot retention on 12/12-Foley catheter was subsequently exchanged-per urology-although urine is pink-tinged today-he subsequently underwent cystoscopy on 06/15/2024 by urology, clear urine now, continue to monitor for any output Continue to hold Eliquis  Continue Flomax /finasteride  Continue CBI Defer further to urology service. Type Screen and monitor CBC may require transfusion in a day or 2, patient is agreeable   Severe sepsis with hypotension secondary to E. coli bacteremia (POA) Likely result of above/recent prostate biopsy On IV Rocephin  Was hypotensive on 12/12 requiring numerous fluid boluses and initiation of midodrine  BP has now stabilized-however continues to have persistent leukocytosis-concern that they may be clot retention again-awaiting CT abdomen study. Continue IV Rocephin   AKI Probably secondary to obstructive uropathy due to urinary retention-and some amount of hemodynamic injury in the setting of sepsis Renal function improving Avoid nephrotoxic agents  Acute metabolic encephalopathy Likely secondary to bacteremia/AKI-seems to have resolved-patient is relatively awake and alert this morning.  ?  Hypoxia Not certain if this is a true pulse ox reading No evidence of PNA or CHF clinically-on Eliquis -so doubt VTE Will attempt to titrate oxygen and see how he does  History of chronic HFpEF Euvolemic Due to hypotension-hold all  diuretics  HTN Hold Cardizem -hypotensive this  morning  History of ICH 2023 In the setting of Coumadin  use-has been switched to Eliquis  but currently on hold Supportive care  OSA Monitor-start CPAP if needed.  Class II obesity: Estimated body mass index is 36.06 kg/m as calculated from the following:   Height as of this encounter: 5' 10 (1.778 m).   Weight as of this encounter: 114 kg.    Code status:   Code Status: Full Code   DVT Prophylaxis:SCD's-given severe verity of hematuria causing recurrent clot retention/urinary retention Eliquis  remains on hold  Family Communication: None at bedside   Disposition Plan: Status is: Inpatient Remains inpatient appropriate because: Severity of illness   Planned Discharge Destination:Home   Diet: Diet Order             Diet Heart Room service appropriate? Yes; Fluid consistency: Thin  Diet effective now                     Antimicrobial agents: Anti-infectives (From admission, onward)    Start     Dose/Rate Route Frequency Ordered Stop   06/14/24 1400  cefTRIAXone  (ROCEPHIN ) 2 g in sodium chloride  0.9 % 100 mL IVPB        2 g 200 mL/hr over 30 Minutes Intravenous Every 24 hours 06/13/24 1606     06/14/24 0215  cefTRIAXone  (ROCEPHIN ) 1 g in sodium chloride  0.9 % 100 mL IVPB       Note to Pharmacy: Please give in addition to 1g dose give earlier to complete 2g total for bacteremia   1 g 200 mL/hr over 30 Minutes Intravenous  Once 06/14/24 0206 06/14/24 0257   06/13/24 1330  cefTRIAXone  (ROCEPHIN ) 1 g in sodium chloride  0.9 % 100 mL IVPB  Status:  Discontinued        1 g 200 mL/hr over 30 Minutes Intravenous  Once 06/13/24 1325 06/13/24 1931   06/13/24 1330  azithromycin  (ZITHROMAX ) 500 mg in sodium chloride  0.9 % 250 mL IVPB        500 mg 250 mL/hr over 60 Minutes Intravenous  Once 06/13/24 1325 06/13/24 1511        MEDICATIONS: Scheduled Meds:  allopurinol   100 mg Oral Daily   Chlorhexidine  Gluconate Cloth   6 each Topical Daily   finasteride   5 mg Oral Daily   midodrine   10 mg Oral TID WC   sodium chloride  flush  3 mL Intravenous Q12H   tamsulosin   0.4 mg Oral QPC supper   Continuous Infusions:  cefTRIAXone  (ROCEPHIN )  IV Stopped (06/16/24 1551)   lactated ringers  500 mL/hr at 06/16/24 1828   lactated ringers  Stopped (06/14/24 1715)   PRN Meds:.acetaminophen  **OR** acetaminophen , albuterol    I have personally reviewed following labs and imaging studies  LABORATORY DATA: CBC: Recent Labs  Lab 06/13/24 1128 06/14/24 0337 06/14/24 1357 06/15/24 0442 06/15/24 1845 06/16/24 0428 06/17/24 0316  WBC 11.2*   < > 30.5* 32.0* 25.2* 21.3* 24.3*  NEUTROABS 8.7*  --   --   --   --   --  19.3*  HGB 16.6   < > 11.4* 9.7* 8.5* 8.5* 7.5*  HCT 51.2   < > 34.7* 29.2* 26.2* 25.4* 22.8*  MCV 85.2   < > 84.8 83.2 83.2 83.8 84.1  PLT 174   < > 126* 131* 124* 142* 161   < > = values in this interval not displayed.    Basic Metabolic Panel: Recent Labs  Lab 06/13/24 1128 06/14/24 0337 06/15/24 0442  06/16/24 0428 06/17/24 0316  NA 136 135 132* 133* 136  K 4.1 4.3 4.9 4.6 4.5  CL 100 105 99 99 104  CO2 25 19* 24 25 29   GLUCOSE 131* 140* 100* 125* 92  BUN 16 21 38* 34* 25*  CREATININE 1.39* 1.91* 1.81* 1.19 1.06  CALCIUM  8.5* 7.8* 7.4* 7.5* 7.8*  MG  --   --   --   --  2.3    GFR: Estimated Creatinine Clearance: 79.7 mL/min (by C-G formula based on SCr of 1.06 mg/dL).  Liver Function Tests: Recent Labs  Lab 06/13/24 1128 06/15/24 0442 06/16/24 0428  AST 27 34 31  ALT 15 21 25   ALKPHOS 90 54 52  BILITOT 2.3* 0.7 0.6  PROT 7.4 5.0* 5.2*  ALBUMIN 3.3* 2.1* 2.1*   No results for input(s): LIPASE, AMYLASE in the last 168 hours. No results for input(s): AMMONIA in the last 168 hours.  Coagulation Profile: Recent Labs  Lab 06/13/24 1128  INR 1.3*    Cardiac Enzymes: No results for input(s): CKTOTAL, CKMB, CKMBINDEX, TROPONINI in the last 168 hours.  BNP  (last 3 results) No results for input(s): PROBNP in the last 8760 hours.  Lipid Profile: No results for input(s): CHOL, HDL, LDLCALC, TRIG, CHOLHDL, LDLDIRECT in the last 72 hours.  Thyroid Function Tests: No results for input(s): TSH, T4TOTAL, FREET4, T3FREE, THYROIDAB in the last 72 hours.  Anemia Panel: No results for input(s): VITAMINB12, FOLATE, FERRITIN, TIBC, IRON, RETICCTPCT in the last 72 hours.  Urine analysis:    Component Value Date/Time   COLORURINE RED (A) 06/13/2024 1625   APPEARANCEUR TURBID (A) 06/13/2024 1625   LABSPEC  06/13/2024 1625    TEST NOT REPORTED DUE TO COLOR INTERFERENCE OF URINE PIGMENT   PHURINE  06/13/2024 1625    TEST NOT REPORTED DUE TO COLOR INTERFERENCE OF URINE PIGMENT   GLUCOSEU (A) 06/13/2024 1625    TEST NOT REPORTED DUE TO COLOR INTERFERENCE OF URINE PIGMENT   HGBUR (A) 06/13/2024 1625    TEST NOT REPORTED DUE TO COLOR INTERFERENCE OF URINE PIGMENT   BILIRUBINUR (A) 06/13/2024 1625    TEST NOT REPORTED DUE TO COLOR INTERFERENCE OF URINE PIGMENT   KETONESUR NEGATIVE 06/13/2024 1625   PROTEINUR (A) 06/13/2024 1625    TEST NOT REPORTED DUE TO COLOR INTERFERENCE OF URINE PIGMENT   UROBILINOGEN 2.0 (H) 08/14/2020 1735   NITRITE (A) 06/13/2024 1625    TEST NOT REPORTED DUE TO COLOR INTERFERENCE OF URINE PIGMENT   LEUKOCYTESUR (A) 06/13/2024 1625    TEST NOT REPORTED DUE TO COLOR INTERFERENCE OF URINE PIGMENT    Sepsis Labs: Lactic Acid, Venous    Component Value Date/Time   LATICACIDVEN 2.2 (HH) 06/14/2024 1648    MICROBIOLOGY: Recent Results (from the past 240 hours)  Blood Culture (routine x 2)     Status: Abnormal   Collection Time: 06/13/24 11:50 AM   Specimen: BLOOD  Result Value Ref Range Status   Specimen Description BLOOD RIGHT ANTECUBITAL  Final   Special Requests   Final    BOTTLES DRAWN AEROBIC AND ANAEROBIC Blood Culture adequate volume   Culture  Setup Time   Final    GRAM  NEGATIVE RODS IN BOTH AEROBIC AND ANAEROBIC BOTTLES CRITICAL VALUE NOTED.  VALUE IS CONSISTENT WITH PREVIOUSLY REPORTED AND CALLED VALUE.    Culture (A)  Final    ESCHERICHIA COLI SUSCEPTIBILITIES PERFORMED ON PREVIOUS CULTURE WITHIN THE LAST 5 DAYS. Performed at [facility],  [address] [place], [place] [address]    Report Status 06/16/2024 FINAL  Final  Blood Culture (routine x 2)     Status: Abnormal   Collection Time: 06/13/24 12:18 PM   Specimen: BLOOD LEFT HAND  Result Value Ref Range Status   Specimen Description BLOOD LEFT HAND  Final   Special Requests   Final    BOTTLES DRAWN AEROBIC AND ANAEROBIC Blood Culture adequate volume   Culture  Setup Time   Final    GRAM NEGATIVE RODS IN BOTH AEROBIC AND ANAEROBIC BOTTLES CRITICAL RESULT CALLED TO, READ BACK BY AND VERIFIED WITH: PHARMD  J LEDFORD 06/04/2024 @ 0140 BY AB Performed at [facility], [address] [place], [place] [address]    Culture ESCHERICHIA COLI (A)  Final   Report Status 06/16/2024 FINAL  Final   Organism ID, Bacteria ESCHERICHIA COLI  Final      Susceptibility   Escherichia coli - MIC*    AMPICILLIN 16 INTERMEDIATE Intermediate     CEFAZOLIN  (NON-URINE) 16 RESISTANT Resistant     CEFEPIME  <=0.12 SENSITIVE Sensitive     ERTAPENEM <=0.12 SENSITIVE Sensitive     CEFTRIAXONE  1 SENSITIVE Sensitive     CIPROFLOXACIN  <=0.06 SENSITIVE Sensitive     GENTAMICIN  <=1 SENSITIVE Sensitive     MEROPENEM <=0.25 SENSITIVE Sensitive     TRIMETH/SULFA <=20 SENSITIVE Sensitive     AMPICILLIN/SULBACTAM 4 SENSITIVE Sensitive     PIP/TAZO Value in next row Sensitive      <=4 SENSITIVEThis is a modified FDA-approved test that has been validated and its performance characteristics determined by the reporting laboratory.  This laboratory is certified under the Clinical Laboratory Improvement Amendments CLIA as qualified to perform high complexity clinical laboratory testing.    * ESCHERICHIA COLI  Blood  Culture ID Panel (Reflexed)     Status: Abnormal   Collection Time: 06/13/24 12:18 PM  Result Value Ref Range Status   Enterococcus faecalis NOT DETECTED NOT DETECTED Corrected   Enterococcus Faecium NOT DETECTED NOT DETECTED Corrected   Listeria monocytogenes NOT DETECTED NOT DETECTED Corrected   Staphylococcus species NOT DETECTED NOT DETECTED Corrected   Staphylococcus aureus (BCID) NOT DETECTED NOT DETECTED Corrected   Staphylococcus epidermidis NOT DETECTED NOT DETECTED Corrected   Staphylococcus lugdunensis NOT DETECTED NOT DETECTED Corrected   Streptococcus species NOT DETECTED NOT DETECTED Corrected   Streptococcus agalactiae NOT DETECTED NOT DETECTED Corrected   Streptococcus pneumoniae NOT DETECTED NOT DETECTED Corrected   Streptococcus pyogenes NOT DETECTED NOT DETECTED Corrected   A.calcoaceticus-baumannii NOT DETECTED NOT DETECTED Corrected   Bacteroides fragilis NOT DETECTED NOT DETECTED Corrected   Enterobacterales DETECTED (A) NOT DETECTED Corrected    Comment: Enterobacterales represent a large order of gram negative bacteria, not a single organism. CRITICAL RESULT CALLED TO, READ BACK BY AND VERIFIED WITH: PHARMD  J LEDFORD 06/04/2024 @ 0140 BY AB    Enterobacter cloacae complex NOT DETECTED NOT DETECTED Corrected   Escherichia coli DETECTED (A) NOT DETECTED Corrected    Comment: CRITICAL RESULT CALLED TO, READ BACK BY AND VERIFIED WITH: PHARMD  J LEDFORD 06/04/2024 @ 0140 BY AB    Klebsiella aerogenes NOT DETECTED NOT DETECTED Corrected   Klebsiella oxytoca NOT DETECTED NOT DETECTED Corrected   Klebsiella pneumoniae NOT DETECTED NOT DETECTED Corrected   Proteus species NOT DETECTED NOT DETECTED Corrected   Salmonella species NOT DETECTED NOT DETECTED Corrected   Serratia marcescens NOT DETECTED NOT DETECTED Corrected   Haemophilus influenzae NOT DETECTED NOT  DETECTED Corrected   Neisseria meningitidis NOT DETECTED NOT DETECTED Corrected   Pseudomonas aeruginosa NOT  DETECTED NOT DETECTED Corrected   Stenotrophomonas maltophilia NOT DETECTED NOT DETECTED Corrected   Candida albicans NOT DETECTED NOT DETECTED Corrected   Candida auris NOT DETECTED NOT DETECTED Corrected   Candida glabrata NOT DETECTED NOT DETECTED Corrected   Candida krusei NOT DETECTED NOT DETECTED Corrected   Candida parapsilosis NOT DETECTED NOT DETECTED Corrected   Candida tropicalis NOT DETECTED NOT DETECTED Corrected   Cryptococcus neoformans/gattii NOT DETECTED NOT DETECTED Corrected   CTX-M ESBL NOT DETECTED NOT DETECTED Corrected   Carbapenem resistance IMP NOT DETECTED NOT DETECTED Corrected   Carbapenem resistance KPC NOT DETECTED NOT DETECTED Corrected   Carbapenem resistance NDM NOT DETECTED NOT DETECTED Corrected   Carbapenem resist OXA 48 LIKE NOT DETECTED NOT DETECTED Corrected   Carbapenem resistance VIM NOT DETECTED NOT DETECTED Corrected    Comment: Performed at [facility], [address], [place], [place] [address]  Respiratory (~20 pathogens) panel by PCR     Status: None   Collection Time: 06/13/24  5:17 PM   Specimen: Nasopharyngeal Swab; Respiratory  Result Value Ref Range Status   Adenovirus NOT DETECTED NOT DETECTED Final   Coronavirus 229E NOT DETECTED NOT DETECTED Final    Comment: (NOTE) The Coronavirus on the Respiratory Panel, DOES NOT test for the novel  Coronavirus (2019 nCoV)    Coronavirus HKU1 NOT DETECTED NOT DETECTED Final   Coronavirus NL63 NOT DETECTED NOT DETECTED Final   Coronavirus OC43 NOT DETECTED NOT DETECTED Final   Metapneumovirus NOT DETECTED NOT DETECTED Final   Rhinovirus / Enterovirus NOT DETECTED NOT DETECTED Final   Influenza A NOT DETECTED NOT DETECTED Final   Influenza B NOT DETECTED NOT DETECTED Final   Parainfluenza Virus 1 NOT DETECTED NOT DETECTED Final   Parainfluenza Virus 2 NOT DETECTED NOT DETECTED Final   Parainfluenza Virus 3 NOT DETECTED NOT DETECTED Final   Parainfluenza Virus 4 NOT DETECTED NOT  DETECTED Final   Respiratory Syncytial Virus NOT DETECTED NOT DETECTED Final   Bordetella pertussis NOT DETECTED NOT DETECTED Final   Bordetella Parapertussis NOT DETECTED NOT DETECTED Final   Chlamydophila pneumoniae NOT DETECTED NOT DETECTED Final   Mycoplasma pneumoniae NOT DETECTED NOT DETECTED Final    Comment: Performed at [facility], [address], [place], [place] [address]  MRSA Next Gen by PCR, Nasal     Status: None   Collection Time: 06/13/24 11:02 PM   Specimen: Nasal Mucosa; Nasal Swab  Result Value Ref Range Status   MRSA by PCR Next Gen NOT DETECTED NOT DETECTED Final    Comment: (NOTE) The GeneXpert MRSA Assay (FDA approved for NASAL specimens only), is one component of a comprehensive MRSA colonization surveillance program. It is not intended to diagnose MRSA infection nor to guide or monitor treatment for MRSA infections. Test performance is not FDA approved in patients less than 34 years old. Performed at [facility], [address] [place], [place] [address]     RADIOLOGY STUDIES/RESULTS: CT RENAL STONE STUDY Result Date: 06/15/2024 CLINICAL DATA:  Abdominal and flank pain.  Kidney stones suspected. EXAM: CT ABDOMEN AND PELVIS WITHOUT CONTRAST TECHNIQUE: Multidetector CT imaging of the abdomen and pelvis was performed following the standard protocol without IV contrast. RADIATION DOSE REDUCTION: This exam was performed according to the departmental dose-optimization program which includes automated exposure control, adjustment of the mA and/or kV according to patient size  and/or use of iterative reconstruction technique. COMPARISON:  08/15/2020 FINDINGS: Lower chest: Scarring/pleural thickening at the right base is stable in the interval. Hepatobiliary: No suspicious focal abnormality in the liver on this study without intravenous contrast. There is no evidence for gallstones, gallbladder wall thickening, or pericholecystic fluid. No intrahepatic or  extrahepatic biliary dilation. Pancreas: No focal mass lesion. No dilatation of the main duct. No intraparenchymal cyst. No peripancreatic edema. Spleen: No splenomegaly. No suspicious focal mass lesion. Adrenals/Urinary Tract: No adrenal nodule or mass. Kidneys unremarkable. No evidence for hydroureter. Foley balloon is identified in the bladder lumen. Bladder lumen is filled with high density material. Circumferential gas in the bladder wall along the mucosal surface is compatible with emphysematous cystitis. Tiny gas collections anterior to the low bladder appear to be extraluminal (see sagittal image 85 of series 7) with a linear gas collection anterior to the bladder potentially intravascular (sagittal 78/7). Gas along the right pelvic sidewall has a tubular configuration may also be intravascular (image 72/3). Stomach/Bowel: Stomach is unremarkable. No gastric wall thickening. No evidence of outlet obstruction. Duodenum is normally positioned as is the ligament of Treitz. No small bowel wall thickening. No small bowel dilatation. Right hemicolectomy. No gross colonic mass. No colonic wall thickening. Vascular/Lymphatic: There is mild atherosclerotic calcification of the abdominal aorta without aneurysm. There is no gastrohepatic or hepatoduodenal ligament lymphadenopathy. No retroperitoneal or mesenteric lymphadenopathy. No pelvic sidewall lymphadenopathy. Reproductive: Prostate gland is enlarged. Other: No free fluid in the abdomen or pelvis. Edema is identified in the soft tissues of the pelvic floor. Musculoskeletal: Left greater than right groin hernias contain only fat. No worrisome lytic or sclerotic osseous abnormality. IMPRESSION: 1. Circumferential gas in the bladder wall along the mucosal surface is compatible with emphysematous cystitis. Tiny gas collections anterior to the low bladder appear to be extraluminal. Linear/tubular gas collection seen anterior to the bladder and along the right pelvic  sidewall suggest intravascular, likely venous, gas. 2. High density material in the bladder lumen is compatible with blood products/clot. 3. Prostatomegaly. 4. Left greater than right groin hernias contain only fat. 5. Aortic Atherosclerosis (ICD10-I70.0) and Emphysema (ICD10-J43.9). These results will be called to the ordering clinician or representative by the Radiologist Assistant, and communication documented in the PACS or [employer]. Electronically Signed   By: Camellia Candle M.D.   On: 06/15/2024 12:37     LOS: 4 days   Lavada Stank, MD  [facility]    To contact the attending provider between 7A-7P or the covering provider during after hours 7P-7A, please log into the web site [email or web address] and access using universal [place] password for that web site. If you do not have the password, please call the hospital operator.  06/17/2024, 9:00 AM

## 2024-06-17 NOTE — Plan of Care (Signed)

## 2024-06-17 NOTE — Care Management Important Message (Signed)
 Important Message  Patient Details  Name: Steven Barnett MRN: [id number] Date of Birth: 27-Jul-1950   Important Message Given:  Yes - Medicare IM     Claretta Deed 06/17/2024, 4:40 PM

## 2024-06-17 NOTE — TOC Progression Note (Signed)
 Transition of Care [facility]) - Progression Note    Patient Details  Name: Steven Barnett MRN: [id number] Date of Birth: 1951-05-15  [facility] of [facility]) CM/SW Contact  [name], RN Phone Number: 06/17/2024, 3:59 PM  Clinical Narrative:     Continued medical workup.   Currently waiting for PT eval.   CM will continue to follow.   Expected Discharge Plan:  (TBD) Barriers to Discharge: Continued Medical Work up               Expected Discharge Plan and Services       Living arrangements for the past 2 months: Single Family Home                                       Social Drivers of Health (SDOH) Interventions SDOH Screenings   Food Insecurity: No Food Insecurity (06/13/2024)  Housing: Low Risk (06/13/2024)  Transportation Needs: No Transportation Needs (06/13/2024)  Utilities: At Risk (06/13/2024)  Depression (PHQ2-9): Low Risk (12/05/2023)  Social Connections: Moderately Isolated (06/13/2024)  Tobacco Use: Medium Risk (06/15/2024)    Readmission Risk Interventions     No data to display

## 2024-06-18 LAB — CBC WITH DIFFERENTIAL/PLATELET
Abs Immature Granulocytes: 2 K/uL — ABNORMAL HIGH (ref 0.00–0.07)
Band Neutrophils: 2 %
Basophils Absolute: 0.2 K/uL — ABNORMAL HIGH (ref 0.0–0.1)
Basophils Relative: 1 %
Eosinophils Absolute: 0.7 K/uL — ABNORMAL HIGH (ref 0.0–0.5)
Eosinophils Relative: 3 %
HCT: 21.8 % — ABNORMAL LOW (ref 39.0–52.0)
Hemoglobin: 7.1 g/dL — ABNORMAL LOW (ref 13.0–17.0)
Lymphocytes Relative: 7 %
Lymphs Abs: 1.6 K/uL (ref 0.7–4.0)
MCH: 28 pg (ref 26.0–34.0)
MCHC: 32.6 g/dL (ref 30.0–36.0)
MCV: 85.8 fL (ref 80.0–100.0)
Metamyelocytes Relative: 2 %
Monocytes Absolute: 2 K/uL — ABNORMAL HIGH (ref 0.1–1.0)
Monocytes Relative: 9 %
Myelocytes: 3 %
Neutro Abs: 15.8 K/uL — ABNORMAL HIGH (ref 1.7–7.7)
Neutrophils Relative %: 69 %
Platelets: 184 K/uL (ref 150–400)
Promyelocytes Relative: 4 %
RBC: 2.54 MIL/uL — ABNORMAL LOW (ref 4.22–5.81)
RDW: 14.4 % (ref 11.5–15.5)
WBC: 22.2 K/uL — ABNORMAL HIGH (ref 4.0–10.5)
nRBC: 4.4 % — ABNORMAL HIGH (ref 0.0–0.2)

## 2024-06-18 LAB — BASIC METABOLIC PANEL WITH GFR
Anion gap: 5 (ref 5–15)
BUN: 23 mg/dL (ref 8–23)
CO2: 28 mmol/L (ref 22–32)
Calcium: 7.6 mg/dL — ABNORMAL LOW (ref 8.9–10.3)
Chloride: 101 mmol/L (ref 98–111)
Creatinine, Ser: 1.03 mg/dL (ref 0.61–1.24)
GFR, Estimated: >60 mL/min (ref >60)
Glucose, Bld: 80 mg/dL (ref 70–99)
Potassium: 4.3 mmol/L (ref 3.5–5.1)
Sodium: 134 mmol/L — ABNORMAL LOW (ref 135–145)

## 2024-06-18 LAB — MAGNESIUM: Magnesium: 1.9 mg/dL (ref 1.7–2.4)

## 2024-06-18 LAB — PATHOLOGIST SMEAR REVIEW

## 2024-06-18 LAB — PROCALCITONIN: Procalcitonin: 3.83 ng/mL

## 2024-06-18 LAB — PREPARE RBC (CROSSMATCH)

## 2024-06-18 LAB — C-REACTIVE PROTEIN: CRP: 2.9 mg/dL — ABNORMAL HIGH (ref <1.0)

## 2024-06-18 MED ORDER — NAPHAZOLINE-GLYCERIN 0.012-0.25 % OP SOLN
1.0000 [drp] | Freq: Four times a day (QID) | OPHTHALMIC | Status: DC | PRN
  Administered 2024-06-18: 12:00:00 1 [drp] via OPHTHALMIC
  Filled 2024-06-18: qty 15

## 2024-06-18 MED ORDER — SODIUM CHLORIDE 0.9% IV SOLUTION: Freq: Once | INTRAVENOUS | Status: DC

## 2024-06-18 NOTE — Progress Notes (Signed)
 24 fr foley removed today at 1 pm and pt still hasn't voided. Writer bladder scanned him at 8pm it was , then at 10pm now at 0000 its . he is still unable to void . at first he refused to be st cathed, but now he is willing . however writer does not feel comfortable st cathing him given all his history with his prostate. On call md made aware and rapid response nurse st cath pt without difficulty  1000 ml of urine obtained. Pt denies any pain or discomfort

## 2024-06-18 NOTE — Plan of Care (Signed)

## 2024-06-18 NOTE — Plan of Care (Signed)
  Problem: Education: Goal: Knowledge of General Education information will improve Description: Including pain rating scale, medication(s)/side effects and non-pharmacologic comfort measures Outcome: Progressing   Problem: Health Behavior/Discharge Planning: Goal: Ability to manage health-related needs will improve Outcome: Progressing   Problem: Clinical Measurements: Goal: Diagnostic test results will improve Outcome: Progressing Goal: Respiratory complications will improve Outcome: Progressing Goal: Cardiovascular complication will be avoided Outcome: Progressing   Problem: Activity: Goal: Risk for activity intolerance will decrease Outcome: Progressing   

## 2024-06-18 NOTE — Progress Notes (Signed)
 PROGRESS NOTE        PATIENT DETAILS Name: Steven Barnett Age: 73 y.o. Sex: male Date of Birth: May 29, 1951 Admit Date: 06/13/2024 Admitting Physician Maximino DELENA Sharps, MD ERE:[id number], Leita Repine, FNP (Inactive)  Brief Summary: Patient is a 73 y.o.  male with history of A-fib on Eliquis -who recently underwent prostate biopsy on 12/5-presented with hematuria and difficulty urinating-patient was found to have urinary retention in the setting of clot colic-evaluated by urology-Foley inserted-started on continuous bladder irrigation-subsequently blood cultures positive for gram-negative bacteremia.  Significant events: 12/11>> admit to [facility]-hematuria-urinary retention-Foley placed-CBI started 12/12>> urinary retention while on CBI-no output obtained in spite of frequent flushes-urology consulted-new French 24 hematuria catheter placed-800 cc of bloody urine obtained.  Significant studies: 12/11>> CXR: No acute cardiopulmonary findings 12/11>> CT head: No acute intracranial process  Significant microbiology data: 12/11>> COVID PCR: Negative 12/11>> blood culture: E. coli  Procedures: Underwent prostate biopsy with Dr. Devere 06/07/2024.  Presented 06/13/2024 with gross hematuria and difficulty urinating after starting Eliquis  and developing gross hematuria.   He was initially irrigated 12/11 and 12/12 by Dr. Nieves with a 24 French three-way foley.    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     Consults: Urology  Subjective:  Patient in bed, appears comfortable, denies any headache, no fever, no chest pain or pressure, no shortness of breath , no abdominal pain. No new focal weakness.   Objective: Vitals: Blood pressure 124/77, pulse 87, temperature 98 F (36.7 C), temperature source Oral, resp. rate 18, height 5' 10 (1.778 m), weight 114 kg, SpO2 97%.   Exam:  Awake Alert, No new F.N  deficits, Normal affect [place].AT,PERRAL Supple Neck, No JVD,   Symmetrical Chest wall movement, Good air movement bilaterally, CTAB RRR,No Gallops, Rubs or new Murmurs,  +ve B.Sounds, Abd Soft, No tenderness, Foley in place No Cyanosis, Clubbing or edema     Assessment/Plan:  Gross hematuria with clot retention leading to acute urinary retention (in the setting of recent prostate biopsy/Eliquis  use) acute blood loss related anemia. S/p Foley catheter insertion/CBI-and TXA on 12/11-unfortunately even while on CBI-redeveloped clot retention on 12/12-Foley catheter was subsequently exchanged-per urology-although urine is pink-tinged today-he subsequently underwent cystoscopy on 06/15/2024 by urology, clear urine now, continue to monitor for any output, if failed Foley catheter removal trial by urology on 06/17/2024, new catheter to be placed 06/18/2024, urology on board. Continue to hold Eliquis  Continue Flomax /finasteride  Continue CBI Defer further to urology service. Getting 1 unit of packed RBC on 06/18/2024   Severe sepsis with hypotension secondary to E. coli bacteremia (POA) Likely result of above/recent prostate biopsy On IV Rocephin  Was hypotensive on 12/12 requiring numerous fluid boluses and initiation of midodrine  BP has now stabilized-however continues to have persistent leukocytosis-concern that they may be clot retention again-awaiting CT abdomen study. Continue IV Rocephin   AKI Probably secondary to obstructive uropathy due to urinary retention-and some amount of hemodynamic injury in the setting of sepsis Renal function improving Avoid nephrotoxic agents  Acute metabolic encephalopathy Likely secondary to bacteremia/AKI-seems to have resolved-patient is relatively awake and alert this morning.  ?  Hypoxia Not certain if this is a true pulse ox reading No evidence of PNA or CHF clinically-on Eliquis -so doubt VTE Will attempt to titrate oxygen and see how he  does  History of chronic HFpEF  Euvolemic Due to hypotension-hold all diuretics  HTN Hold Cardizem -hypotensive this morning  History of ICH 2023 In the setting of Coumadin  use-has been switched to Eliquis  but currently on hold Supportive care  OSA Monitor-start CPAP if needed.  Class II obesity: Estimated body mass index is 36.06 kg/m as calculated from the following:   Height as of this encounter: 5' 10 (1.778 m).   Weight as of this encounter: 114 kg.    Code status:   Code Status: Full Code   DVT Prophylaxis:SCD's-given severe verity of hematuria causing recurrent clot retention/urinary retention Eliquis  remains on hold  Family Communication: None at bedside   Disposition Plan: Status is: Inpatient Remains inpatient appropriate because: Severity of illness   Planned Discharge Destination:Home   Diet: Diet Order             Diet Heart Room service appropriate? Yes; Fluid consistency: Thin  Diet effective now                     Antimicrobial agents: Anti-infectives (From admission, onward)    Start     Dose/Rate Route Frequency Ordered Stop   06/14/24 1400  cefTRIAXone  (ROCEPHIN ) 2 g in sodium chloride  0.9 % 100 mL IVPB        2 g 200 mL/hr over 30 Minutes Intravenous Every 24 hours 06/13/24 1606     06/14/24 0215  cefTRIAXone  (ROCEPHIN ) 1 g in sodium chloride  0.9 % 100 mL IVPB       Note to Pharmacy: Please give in addition to 1g dose give earlier to complete 2g total for bacteremia   1 g 200 mL/hr over 30 Minutes Intravenous  Once 06/14/24 0206 06/14/24 0257   06/13/24 1330  cefTRIAXone  (ROCEPHIN ) 1 g in sodium chloride  0.9 % 100 mL IVPB  Status:  Discontinued        1 g 200 mL/hr over 30 Minutes Intravenous  Once 06/13/24 1325 06/13/24 1931   06/13/24 1330  azithromycin  (ZITHROMAX ) 500 mg in sodium chloride  0.9 % 250 mL IVPB        500 mg 250 mL/hr over 60 Minutes Intravenous  Once 06/13/24 1325 06/13/24 1511         MEDICATIONS: Scheduled Meds:  sodium chloride    Intravenous Once   allopurinol   100 mg Oral Daily   Chlorhexidine  Gluconate Cloth  6 each Topical Daily   finasteride   5 mg Oral Daily   midodrine   10 mg Oral TID WC   sodium chloride  flush  3 mL Intravenous Q12H   tamsulosin   0.4 mg Oral QPC supper   Continuous Infusions:  cefTRIAXone  (ROCEPHIN )  IV Stopped (06/17/24 1620)   lactated ringers  500 mL/hr at 06/17/24 1835   lactated ringers  Stopped (06/14/24 1715)   PRN Meds:.acetaminophen  **OR** acetaminophen , albuterol    I have personally reviewed following labs and imaging studies  LABORATORY DATA: CBC: Recent Labs  Lab 06/13/24 1128 06/14/24 0337 06/15/24 0442 06/15/24 1845 06/16/24 0428 06/17/24 0316 06/18/24 0414  WBC 11.2*   < > 32.0* 25.2* 21.3* 24.3* 22.2*  NEUTROABS 8.7*  --   --   --   --  19.3* 15.8*  HGB 16.6   < > 9.7* 8.5* 8.5* 7.5* 7.1*  HCT 51.2   < > 29.2* 26.2* 25.4* 22.8* 21.8*  MCV 85.2   < > 83.2 83.2 83.8 84.1 85.8  PLT 174   < > 131* 124* 142* 161 184   < > = values in this interval not  displayed.    Basic Metabolic Panel: Recent Labs  Lab 06/14/24 0337 06/15/24 0442 06/16/24 0428 06/17/24 0316 06/18/24 0414  NA 135 132* 133* 136 134*  K 4.3 4.9 4.6 4.5 4.3  CL 105 99 99 104 101  CO2 19* 24 25 29 28   GLUCOSE 140* 100* 125* 92 80  BUN 21 38* 34* 25* 23  CREATININE 1.91* 1.81* 1.19 1.06 1.03  CALCIUM  7.8* 7.4* 7.5* 7.8* 7.6*  MG  --   --   --  2.3 1.9    GFR: Estimated Creatinine Clearance: 82 mL/min (by C-G formula based on SCr of 1.03 mg/dL).  Liver Function Tests: Recent Labs  Lab 06/13/24 1128 06/15/24 0442 06/16/24 0428  AST 27 34 31  ALT 15 21 25   ALKPHOS 90 54 52  BILITOT 2.3* 0.7 0.6  PROT 7.4 5.0* 5.2*  ALBUMIN 3.3* 2.1* 2.1*   No results for input(s): LIPASE, AMYLASE in the last 168 hours. No results for input(s): AMMONIA in the last 168 hours.  Coagulation Profile: Recent Labs  Lab 06/13/24 1128   INR 1.3*    Cardiac Enzymes: No results for input(s): CKTOTAL, CKMB, CKMBINDEX, TROPONINI in the last 168 hours.  BNP (last 3 results) No results for input(s): PROBNP in the last 8760 hours.  Lipid Profile: No results for input(s): CHOL, HDL, LDLCALC, TRIG, CHOLHDL, LDLDIRECT in the last 72 hours.  Thyroid Function Tests: No results for input(s): TSH, T4TOTAL, FREET4, T3FREE, THYROIDAB in the last 72 hours.  Anemia Panel: No results for input(s): VITAMINB12, FOLATE, FERRITIN, TIBC, IRON, RETICCTPCT in the last 72 hours.  Urine analysis:    Component Value Date/Time   COLORURINE RED (A) 06/13/2024 1625   APPEARANCEUR TURBID (A) 06/13/2024 1625   LABSPEC  06/13/2024 1625    TEST NOT REPORTED DUE TO COLOR INTERFERENCE OF URINE PIGMENT   PHURINE  06/13/2024 1625    TEST NOT REPORTED DUE TO COLOR INTERFERENCE OF URINE PIGMENT   GLUCOSEU (A) 06/13/2024 1625    TEST NOT REPORTED DUE TO COLOR INTERFERENCE OF URINE PIGMENT   HGBUR (A) 06/13/2024 1625    TEST NOT REPORTED DUE TO COLOR INTERFERENCE OF URINE PIGMENT   BILIRUBINUR (A) 06/13/2024 1625    TEST NOT REPORTED DUE TO COLOR INTERFERENCE OF URINE PIGMENT   KETONESUR NEGATIVE 06/13/2024 1625   PROTEINUR (A) 06/13/2024 1625    TEST NOT REPORTED DUE TO COLOR INTERFERENCE OF URINE PIGMENT   UROBILINOGEN 2.0 (H) 08/14/2020 1735   NITRITE (A) 06/13/2024 1625    TEST NOT REPORTED DUE TO COLOR INTERFERENCE OF URINE PIGMENT   LEUKOCYTESUR (A) 06/13/2024 1625    TEST NOT REPORTED DUE TO COLOR INTERFERENCE OF URINE PIGMENT    Sepsis Labs: Lactic Acid, Venous    Component Value Date/Time   LATICACIDVEN 2.2 (HH) 06/14/2024 1648    MICROBIOLOGY: Recent Results (from the past 240 hours)  Blood Culture (routine x 2)     Status: Abnormal   Collection Time: 06/13/24 11:50 AM   Specimen: BLOOD  Result Value Ref Range Status   Specimen Description BLOOD RIGHT ANTECUBITAL  Final   Special  Requests   Final    BOTTLES DRAWN AEROBIC AND ANAEROBIC Blood Culture adequate volume   Culture  Setup Time   Final    GRAM NEGATIVE RODS IN BOTH AEROBIC AND ANAEROBIC BOTTLES CRITICAL VALUE NOTED.  VALUE IS CONSISTENT WITH PREVIOUSLY REPORTED AND CALLED VALUE.    Culture (A)  Final    ESCHERICHIA COLI SUSCEPTIBILITIES  PERFORMED ON PREVIOUS CULTURE WITHIN THE LAST 5 DAYS. Performed at [facility], [address], [place], [place] [address]    Report Status 06/16/2024 FINAL  Final  Blood Culture (routine x 2)     Status: Abnormal   Collection Time: 06/13/24 12:18 PM   Specimen: BLOOD LEFT HAND  Result Value Ref Range Status   Specimen Description BLOOD LEFT HAND  Final   Special Requests   Final    BOTTLES DRAWN AEROBIC AND ANAEROBIC Blood Culture adequate volume   Culture  Setup Time   Final    GRAM NEGATIVE RODS IN BOTH AEROBIC AND ANAEROBIC BOTTLES CRITICAL RESULT CALLED TO, READ BACK BY AND VERIFIED WITH: PHARMD  J LEDFORD 06/04/2024 @ 0140 BY AB Performed at [facility], [address] [place], [place] [address]    Culture ESCHERICHIA COLI (A)  Final   Report Status 06/16/2024 FINAL  Final   Organism ID, Bacteria ESCHERICHIA COLI  Final      Susceptibility   Escherichia coli - MIC*    AMPICILLIN 16 INTERMEDIATE Intermediate     CEFAZOLIN  (NON-URINE) 16 RESISTANT Resistant     CEFEPIME  <=0.12 SENSITIVE Sensitive     ERTAPENEM <=0.12 SENSITIVE Sensitive     CEFTRIAXONE  1 SENSITIVE Sensitive     CIPROFLOXACIN  <=0.06 SENSITIVE Sensitive     GENTAMICIN  <=1 SENSITIVE Sensitive     MEROPENEM <=0.25 SENSITIVE Sensitive     TRIMETH/SULFA <=20 SENSITIVE Sensitive     AMPICILLIN/SULBACTAM 4 SENSITIVE Sensitive     PIP/TAZO Value in next row Sensitive      <=4 SENSITIVEThis is a modified FDA-approved test that has been validated and its performance characteristics determined by the reporting laboratory.  This laboratory is certified under the Clinical Laboratory  Improvement Amendments CLIA as qualified to perform high complexity clinical laboratory testing.    * ESCHERICHIA COLI  Blood Culture ID Panel (Reflexed)     Status: Abnormal   Collection Time: 06/13/24 12:18 PM  Result Value Ref Range Status   Enterococcus faecalis NOT DETECTED NOT DETECTED Corrected   Enterococcus Faecium NOT DETECTED NOT DETECTED Corrected   Listeria monocytogenes NOT DETECTED NOT DETECTED Corrected   Staphylococcus species NOT DETECTED NOT DETECTED Corrected   Staphylococcus aureus (BCID) NOT DETECTED NOT DETECTED Corrected   Staphylococcus epidermidis NOT DETECTED NOT DETECTED Corrected   Staphylococcus lugdunensis NOT DETECTED NOT DETECTED Corrected   Streptococcus species NOT DETECTED NOT DETECTED Corrected   Streptococcus agalactiae NOT DETECTED NOT DETECTED Corrected   Streptococcus pneumoniae NOT DETECTED NOT DETECTED Corrected   Streptococcus pyogenes NOT DETECTED NOT DETECTED Corrected   A.calcoaceticus-baumannii NOT DETECTED NOT DETECTED Corrected   Bacteroides fragilis NOT DETECTED NOT DETECTED Corrected   Enterobacterales DETECTED (A) NOT DETECTED Corrected    Comment: Enterobacterales represent a large order of gram negative bacteria, not a single organism. CRITICAL RESULT CALLED TO, READ BACK BY AND VERIFIED WITH: PHARMD  J LEDFORD 06/04/2024 @ 0140 BY AB    Enterobacter cloacae complex NOT DETECTED NOT DETECTED Corrected   Escherichia coli DETECTED (A) NOT DETECTED Corrected    Comment: CRITICAL RESULT CALLED TO, READ BACK BY AND VERIFIED WITH: PHARMD  J LEDFORD 06/04/2024 @ 0140 BY AB    Klebsiella aerogenes NOT DETECTED NOT DETECTED Corrected   Klebsiella oxytoca NOT DETECTED NOT DETECTED Corrected   Klebsiella pneumoniae NOT DETECTED NOT DETECTED Corrected   Proteus species NOT DETECTED NOT DETECTED Corrected   Salmonella species NOT DETECTED NOT DETECTED Corrected  Serratia marcescens NOT DETECTED NOT DETECTED Corrected   Haemophilus influenzae  NOT DETECTED NOT DETECTED Corrected   Neisseria meningitidis NOT DETECTED NOT DETECTED Corrected   Pseudomonas aeruginosa NOT DETECTED NOT DETECTED Corrected   Stenotrophomonas maltophilia NOT DETECTED NOT DETECTED Corrected   Candida albicans NOT DETECTED NOT DETECTED Corrected   Candida auris NOT DETECTED NOT DETECTED Corrected   Candida glabrata NOT DETECTED NOT DETECTED Corrected   Candida krusei NOT DETECTED NOT DETECTED Corrected   Candida parapsilosis NOT DETECTED NOT DETECTED Corrected   Candida tropicalis NOT DETECTED NOT DETECTED Corrected   Cryptococcus neoformans/gattii NOT DETECTED NOT DETECTED Corrected   CTX-M ESBL NOT DETECTED NOT DETECTED Corrected   Carbapenem resistance IMP NOT DETECTED NOT DETECTED Corrected   Carbapenem resistance KPC NOT DETECTED NOT DETECTED Corrected   Carbapenem resistance NDM NOT DETECTED NOT DETECTED Corrected   Carbapenem resist OXA 48 LIKE NOT DETECTED NOT DETECTED Corrected   Carbapenem resistance VIM NOT DETECTED NOT DETECTED Corrected    Comment: Performed at [facility], [address], [place], [place] [address]  Respiratory (~20 pathogens) panel by PCR     Status: None   Collection Time: 06/13/24  5:17 PM   Specimen: Nasopharyngeal Swab; Respiratory  Result Value Ref Range Status   Adenovirus NOT DETECTED NOT DETECTED Final   Coronavirus 229E NOT DETECTED NOT DETECTED Final    Comment: (NOTE) The Coronavirus on the Respiratory Panel, DOES NOT test for the novel  Coronavirus (2019 nCoV)    Coronavirus HKU1 NOT DETECTED NOT DETECTED Final   Coronavirus NL63 NOT DETECTED NOT DETECTED Final   Coronavirus OC43 NOT DETECTED NOT DETECTED Final   Metapneumovirus NOT DETECTED NOT DETECTED Final   Rhinovirus / Enterovirus NOT DETECTED NOT DETECTED Final   Influenza A NOT DETECTED NOT DETECTED Final   Influenza B NOT DETECTED NOT DETECTED Final   Parainfluenza Virus 1 NOT DETECTED NOT DETECTED Final   Parainfluenza Virus 2 NOT  DETECTED NOT DETECTED Final   Parainfluenza Virus 3 NOT DETECTED NOT DETECTED Final   Parainfluenza Virus 4 NOT DETECTED NOT DETECTED Final   Respiratory Syncytial Virus NOT DETECTED NOT DETECTED Final   Bordetella pertussis NOT DETECTED NOT DETECTED Final   Bordetella Parapertussis NOT DETECTED NOT DETECTED Final   Chlamydophila pneumoniae NOT DETECTED NOT DETECTED Final   Mycoplasma pneumoniae NOT DETECTED NOT DETECTED Final    Comment: Performed at [facility], [address], [place], [place] [address]  MRSA Next Gen by PCR, Nasal     Status: None   Collection Time: 06/13/24 11:02 PM   Specimen: Nasal Mucosa; Nasal Swab  Result Value Ref Range Status   MRSA by PCR Next Gen NOT DETECTED NOT DETECTED Final    Comment: (NOTE) The GeneXpert MRSA Assay (FDA approved for NASAL specimens only), is one component of a comprehensive MRSA colonization surveillance program. It is not intended to diagnose MRSA infection nor to guide or monitor treatment for MRSA infections. Test performance is not FDA approved in patients less than 24 years old. Performed at [facility], [address], [place], [place] [address]     RADIOLOGY STUDIES/RESULTS: No results found.    LOS: 5 days   Lavada Stank, MD  [facility]    To contact the attending provider between 7A-7P or the covering provider during after hours 7P-7A, please log into the web site [email or web address] and access using universal [place] password for that web site. If you do not  have the password, please call the hospital operator.  06/18/2024, 9:49 AM

## 2024-06-18 NOTE — Progress Notes (Signed)
 Physical Therapy Treatment Patient Details Name: Steven Barnett MRN: [id number] DOB: 1950/07/23 Today's Date: 06/18/2024   History of Present Illness 73 y/o  male admitted 06/13/24 with hematuria, abdominal pain following recent prostate biopsy.  Patient underwent CBI though ended up needing cystoscopy for clot evacuation, fulguration on 06/15/24.  PMH positive for CHF, a-fib on anticoagulation, h/o ICH, CKD, morbid obesity.    PT Comments  Pt is demonstrating improved tolerance to activity by demonstrating longer bouts of ambulation with 2-3 brief standing rest breaks. SOB noted while ambulating, pt attempting to hold a conversation and requiring a breath every 4-5 words. Vitals monitored throughout session, HR ranged 80s-130s between rest and activity, pt with history of a-fib. Stairs not trialed today due to symptoms and vitals at time of session. Pt demonstrates good balance with walker in static stance and while ambulating. Pt would benefit from continued PT services focused on pacing techniques, gait, and stair trial in preparation for safe discharge home.     If plan is discharge home, recommend the following: A little help with walking and/or transfers;A little help with bathing/dressing/bathroom;Assistance with cooking/housework;Assist for transportation;Help with stairs or ramp for entrance   Can travel by private vehicle        Equipment Recommendations  Rolling walker (2 wheels)    Recommendations for Other Services       Precautions / Restrictions Precautions Precautions: Fall Recall of Precautions/Restrictions: Intact Restrictions Weight Bearing Restrictions Per Provider Order: No     Mobility  Bed Mobility Overal bed mobility: Needs Assistance Bed Mobility: Supine to Sit     Supine to sit: Min assist     General bed mobility comments: MinA provided for hand held assist to pull trunk upright, pt otherwise completes bed mobility himself.    Transfers Overall  transfer level: Needs assistance Equipment used: Rolling walker (2 wheels) Transfers: Sit to/from Stand Sit to Stand: Contact guard assist           General transfer comment: Pt steady upon standing with rolling walker. Demonstrates good eccentric control when returning to seated position. Initial difficulty pushing from bed to stand but improves with cues.    Ambulation/Gait Ambulation/Gait assistance: Supervision Gait Distance (Feet): 215 Feet (plus 150') Assistive device: Rolling walker (2 wheels) Gait Pattern/deviations: Step-through pattern, Decreased step length - right, Decreased step length - left, Wide base of support   Gait velocity interpretation: 1.31 - 2.62 ft/sec, indicative of limited community ambulator   General Gait Details: Pt demonstrates good use of rolling walker during ambulation. Benefits from 2-3 breif standing rest breaks due to fatigue and SOB.   Stairs Stairs:  (Stairs not performed this session due to SOB with ambulation and level of HR variation. Will make stairs primary goal of next session as appropriate.)           Wheelchair Mobility     Tilt Bed    Modified Rankin (Stroke Patients Only)       Balance Overall balance assessment: Mild deficits observed, not formally tested   Sitting balance-Leahy Scale: Normal Sitting balance - Comments: No sitting balance concerns   Standing balance support: Bilateral upper extremity supported Standing balance-Leahy Scale: Good Standing balance comment: Good standing balance with support of rolling walker, no LOB noted while ambulating with patient, did well navigating tight areas in the room with walker.  Communication Communication Communication: No apparent difficulties  Cognition Arousal: Alert Behavior During Therapy: WFL for tasks assessed/performed   PT - Cognitive impairments: No apparent impairments                         Following  commands: Intact      Cueing Cueing Techniques: Verbal cues, Tactile cues, Visual cues  Exercises      General Comments General comments (skin integrity, edema, etc.): No new skin abnormalities noted.      Pertinent Vitals/Pain Pain Assessment Pain Assessment: No/denies pain    Home Living                          Prior Function            PT Goals (current goals can now be found in the care plan section) Acute Rehab PT Goals Patient Stated Goal: None stated during session. Progress towards PT goals: Progressing toward goals    Frequency    Min 2X/week      PT Plan      Co-evaluation              AM-PAC PT 6 Clicks Mobility   Outcome Measure  Help needed turning from your back to your side while in a flat bed without using bedrails?: None Help needed moving from lying on your back to sitting on the side of a flat bed without using bedrails?: A Little Help needed moving to and from a bed to a chair (including a wheelchair)?: A Little Help needed standing up from a chair using your arms (e.g., wheelchair or bedside chair)?: A Little Help needed to walk in hospital room?: A Little Help needed climbing 3-5 steps with a railing? : A Lot 6 Click Score: 18    End of Session Equipment Utilized During Treatment: Gait belt Activity Tolerance: Patient limited by fatigue;Other (comment) (Pt limited by some SOB) Patient left: in chair;with call bell/phone within reach;with chair alarm set Nurse Communication: Mobility status PT Visit Diagnosis: Other abnormalities of gait and mobility (R26.89);Muscle weakness (generalized) (M62.81)     Time: 1130-1203 PT Time Calculation (min) (ACUTE ONLY): 33 min  Charges:    $Gait Training: 23-37 mins PT General Charges $$ ACUTE PT VISIT: 1 Visit                     Sabra Morel, PT, DPT  Acute Rehabilitation Services         Office: [phone]      Sabra MARLA Morel 06/18/2024, 2:35 PM

## 2024-06-18 NOTE — Progress Notes (Signed)
° °  3 Days Post-Op Subjective: Patient was resting up in chair on arrival.  Foley catheter draining clear yellow urine.  Urinary obstruction overnight  Objective: Vital signs in last 24 hours: Temp:  [97.7 F (36.5 C)-98.4 F (36.9 C)] 98.3 F (36.8 C) (12/16 1128) Pulse Rate:  [87-98] 87 (12/16 0924) Resp:  [15-26] 18 (12/16 0924) BP: (112-149)/(66-130) 124/77 (12/16 0924) SpO2:  [94 %-97 %] 97 % (12/16 0924)  Assessment/Plan: 89m with prostate biopsy w, Dr. Devere PERKING. Hematuria and clot retention after restarting Eliquis   # gross hematuria # clot retention of urine Clot evac and fulguration with Dr. Francisca on 12/13.  Voiding trial after urine it been clear for 2 days.  Foley removed yesterday and patient went back into urinary obstruction overnight.  In&Out cath x 1 with around 1000 cc return.  Directed nursing to replace 55f coud catheter.  Continue finasteride  and Flomax  with plan for voiding trial in clinic in approximately 7 days.   [facility] [address] second floor [place], [place]  [address] [phone]   #E.coli bacteremia Presumably 2/2 biopsy. Afebrile and hemodynamically stable with leukocytosis. Receiving Ceftriaxone .  #AKI 2/2 obstructive uropathy - resolved  Intake/Output from previous day: 12/15 0701 - 12/16 0700 In: 605 [I.V.:5; IV Piggyback:600] Out: 1000 [Urine:1000]  Intake/Output this shift: Total I/O In: 341.7 [Blood:341.7] Out: 1250 [Urine:1250]  Physical Exam:  General: Alert and oriented CV: No cyanosis Lungs: equal chest rise Gu: Foley catheter in place with clear yellow urine  Lab Results: Recent Labs    06/16/24 0428 06/17/24 0316 06/18/24 0414  HGB 8.5* 7.5* 7.1*  HCT 25.4* 22.8* 21.8*   BMET Recent Labs    06/17/24 0316 06/18/24 0414  NA 136 134*  K 4.5 4.3  CL 104 101  CO2 29 28  GLUCOSE 92 80  BUN 25* 23  CREATININE 1.06 1.03  CALCIUM  7.8* 7.6*  HGB 7.5* 7.1*  WBC 24.3*  22.2*     Studies/Results: No results found.     LOS: 5 days   Steven Bourdon, NP [facility] Pager: [phone]  06/18/2024, 2:31 PM

## 2024-06-18 NOTE — TOC Progression Note (Signed)
 Transition of Care [facility]) - Progression Note    Patient Details  Name: Steven Barnett MRN: [id number] Date of Birth: [birth year]  [facility] CM/SW Contact  [name], RN Phone Number: 06/18/2024, 2:56 PM  Clinical Narrative:     PT rec for HHPT. CM spoke with patient about this, who is in agreement and asked CM to contact daughter to verify she is also in agreement with HHPT. CM did call daughter and she agrees. She does not have a preference on agency. HH referrals sent out in HUB, currently waiting for response.   CM will continue to follow.   Expected Discharge Plan:  (TBD) Barriers to Discharge: Continued Medical Work up               Expected Discharge Plan and Services       Living arrangements for the past 2 months: Single Family Home                                       Social Drivers of Health (SDOH) Interventions SDOH Screenings   Food Insecurity: No Food Insecurity (06/13/2024)  Housing: Low Risk (06/13/2024)  Transportation Needs: No Transportation Needs (06/13/2024)  Utilities: At Risk (06/13/2024)  Depression (PHQ2-9): Low Risk (12/05/2023)  Social Connections: Moderately Isolated (06/13/2024)  Tobacco Use: Medium Risk (06/15/2024)    Readmission Risk Interventions     No data to display

## 2024-06-19 DIAGNOSIS — J9601 Acute respiratory failure with hypoxia: Secondary | ICD-10-CM | POA: Diagnosis not present

## 2024-06-19 LAB — BASIC METABOLIC PANEL WITH GFR
Anion gap: 6 (ref 5–15)
BUN: 14 mg/dL (ref 8–23)
CO2: 25 mmol/L (ref 22–32)
Calcium: 8 mg/dL — ABNORMAL LOW (ref 8.9–10.3)
Chloride: 104 mmol/L (ref 98–111)
Creatinine, Ser: 0.9 mg/dL (ref 0.61–1.24)
GFR, Estimated: >60 mL/min (ref >60)
Glucose, Bld: 79 mg/dL (ref 70–99)
Potassium: 4.7 mmol/L (ref 3.5–5.1)
Sodium: 136 mmol/L (ref 135–145)

## 2024-06-19 LAB — CBC WITH DIFFERENTIAL/PLATELET
Abs Immature Granulocytes: 3.67 K/uL — ABNORMAL HIGH (ref 0.00–0.07)
Basophils Absolute: 0 K/uL (ref 0.0–0.1)
Basophils Relative: 0 %
Eosinophils Absolute: 0.4 K/uL (ref 0.0–0.5)
Eosinophils Relative: 2 %
HCT: 24.4 % — ABNORMAL LOW (ref 39.0–52.0)
Hemoglobin: 8 g/dL — ABNORMAL LOW (ref 13.0–17.0)
Immature Granulocytes: 20 %
Lymphocytes Relative: 13 %
Lymphs Abs: 2.3 K/uL (ref 0.7–4.0)
MCH: 27.9 pg (ref 26.0–34.0)
MCHC: 32.8 g/dL (ref 30.0–36.0)
MCV: 85 fL (ref 80.0–100.0)
Monocytes Absolute: 1.2 K/uL — ABNORMAL HIGH (ref 0.1–1.0)
Monocytes Relative: 7 %
Neutro Abs: 10.6 K/uL — ABNORMAL HIGH (ref 1.7–7.7)
Neutrophils Relative %: 58 %
Platelets: 190 K/uL (ref 150–400)
RBC: 2.87 MIL/uL — ABNORMAL LOW (ref 4.22–5.81)
RDW: 14.6 % (ref 11.5–15.5)
Smear Review: NORMAL
WBC: 18.2 K/uL — ABNORMAL HIGH (ref 4.0–10.5)
nRBC: 4.6 % — ABNORMAL HIGH (ref 0.0–0.2)

## 2024-06-19 LAB — BPAM RBC
Blood Product Expiration Date: 202601122359
ISSUE DATE / TIME: 202512160621
Unit Type and Rh: 5100

## 2024-06-19 LAB — TYPE AND SCREEN
ABO/RH(D): O POS
Antibody Screen: NEGATIVE
Unit division: 0

## 2024-06-19 LAB — C-REACTIVE PROTEIN: CRP: <3 mg/dL — ABNORMAL HIGH (ref <1.0)

## 2024-06-19 LAB — MAGNESIUM: Magnesium: 1.9 mg/dL (ref 1.7–2.4)

## 2024-06-19 LAB — PROCALCITONIN: Procalcitonin: 2.14 ng/mL

## 2024-06-19 MED ORDER — HEPARIN SODIUM (PORCINE) 5000 UNIT/ML IJ SOLN: 5000.0000 [IU] | Freq: Three times a day (TID) | INTRAMUSCULAR | Status: DC

## 2024-06-19 MED ORDER — HEPARIN SODIUM (PORCINE) 5000 UNIT/ML IJ SOLN
5000.0000 [IU] | Freq: Three times a day (TID) | INTRAMUSCULAR | Status: DC
  Administered 2024-06-20 – 2024-06-21 (×4): 5000 [IU] via SUBCUTANEOUS
  Filled 2024-06-19 (×4): qty 1

## 2024-06-19 NOTE — Plan of Care (Signed)

## 2024-06-19 NOTE — Progress Notes (Signed)
 PROGRESS NOTE        PATIENT DETAILS Name: Steven Barnett Age: 73 y.o. Sex: male Date of Birth: 1951-03-08 Admit Date: 06/13/2024 Admitting Physician Maximino DELENA Sharps, MD ERE:Flmmjb, Leita Repine, FNP (Inactive)  Brief Summary: Patient is a 73 y.o.  male with history of A-fib on Eliquis -who recently underwent prostate biopsy on 12/5-presented with hematuria and difficulty urinating-patient was found to have urinary retention in the setting of clot colic-evaluated by urology-Foley inserted-started on continuous bladder irrigation-subsequently blood cultures positive for gram-negative bacteremia.  Significant events: 12/11>> admit to [facility]-hematuria-urinary retention-Foley placed-CBI started 12/12>> urinary retention while on CBI-no output obtained in spite of frequent flushes-urology consulted-new French 24 hematuria catheter placed-800 cc of bloody urine obtained.  Significant studies: 12/11>> CXR: No acute cardiopulmonary findings 12/11>> CT head: No acute intracranial process  Significant microbiology data: 12/11>> COVID PCR: Negative 12/11>> blood culture: E. coli  Procedures: Underwent prostate biopsy with Dr. Devere 06/07/2024.  Presented 06/13/2024 with gross hematuria and difficulty urinating after starting Eliquis  and developing gross hematuria.   He was initially irrigated 12/11 and 12/12 by Dr. Nieves with a 24 French three-way foley.    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     Consults: Urology  Subjective:  Patient in bed, appears comfortable, denies any headache, no fever, no chest pain or pressure, no shortness of breath , no abdominal pain. No new focal weakness.  Objective: Vitals: Blood pressure (!) 131/98, pulse 82, temperature 98.1 F (36.7 C), temperature source Oral, resp. rate 15, height 5' 10 (1.778 m), weight 122.3 kg, SpO2 98%.   Exam:  Awake Alert, No  new F.N deficits, Normal affect [place].AT,PERRAL Supple Neck, No JVD,   Symmetrical Chest wall movement, Good air movement bilaterally, CTAB RRR,No Gallops, Rubs or new Murmurs,  +ve B.Sounds, Abd Soft, No tenderness, Foley in place No Cyanosis, Clubbing or edema     Assessment/Plan:  Gross hematuria with clot retention leading to acute urinary retention (in the setting of recent prostate biopsy/Eliquis  use) acute blood loss related anemia. S/p Foley catheter insertion/CBI-and TXA on 12/11-unfortunately even while on CBI-redeveloped clot retention on 12/12-Foley catheter was subsequently exchanged-per urology-although urine is pink-tinged today-he subsequently underwent cystoscopy on 06/15/2024 by urology, clear urine now, continue to monitor for any output, if failed Foley catheter removal trial by urology on 06/17/2024, new catheter to be placed 06/18/2024, urology on board. Continue to hold Eliquis  Continue Flomax /finasteride  Continue CBI Defer further to urology service. Getting 1 unit of packed RBC on 06/18/2024, stable, monitor for another 24 hours.   Severe sepsis with hypotension secondary to E. coli bacteremia (POA) Likely result of above/recent prostate biopsy On IV Rocephin  Was hypotensive on 12/12 requiring numerous fluid boluses and initiation of midodrine  BP has now stabilized-however continues to have persistent leukocytosis-concern that they may be clot retention again-awaiting CT abdomen study. Continue IV Rocephin   AKI Probably secondary to obstructive uropathy due to urinary retention-and some amount of hemodynamic injury in the setting of sepsis Renal function improving Avoid nephrotoxic agents  Acute metabolic encephalopathy Likely secondary to bacteremia/AKI-seems to have resolved-patient is relatively awake and alert this morning.  ?  Hypoxia Not certain if this is a true pulse ox reading No evidence of PNA or CHF clinically-on Eliquis -so doubt VTE Will  attempt to titrate oxygen and see how he  does  History of chronic HFpEF Euvolemic Due to hypotension-hold all diuretics  HTN Hold Cardizem -hypotensive this morning  History of ICH 2023 In the setting of Coumadin  use-has been switched to Eliquis  but currently on hold Supportive care  OSA Monitor-start CPAP if needed.  Class II obesity: Estimated body mass index is 38.69 kg/m as calculated from the following:   Height as of this encounter: 5' 10 (1.778 m).   Weight as of this encounter: 122.3 kg.    Code status:   Code Status: Full Code   DVT Prophylaxis:SCD's - Heparin  SQ TID added 06/19/24  Family Communication: None at bedside   Disposition Plan: Status is: Inpatient Remains inpatient appropriate because: Severity of illness   Planned Discharge Destination:Home   Diet: Diet Order             Diet Heart Room service appropriate? Yes; Fluid consistency: Thin  Diet effective now                     Data Review:   Patient Lines/Drains/Airways Status     Active Line/Drains/Airways     Name Placement date Placement time Site Days   Peripheral IV 06/13/24 20 G Right Antecubital 06/13/24  1200  Antecubital  6   Peripheral IV 06/13/24 20 G Left Antecubital 06/13/24  2200  Antecubital  6   Urethral Catheter Hannah Coude 18 Fr. 06/18/24  1006  Coude  1             Inpatient Medications  Scheduled Meds:  sodium chloride    Intravenous Once   allopurinol   100 mg Oral Daily   Chlorhexidine  Gluconate Cloth  6 each Topical Daily   finasteride   5 mg Oral Daily   midodrine   10 mg Oral TID WC   sodium chloride  flush  3 mL Intravenous Q12H   tamsulosin   0.4 mg Oral QPC supper   Continuous Infusions:  cefTRIAXone  (ROCEPHIN )  IV 2 g (06/18/24 1409)   lactated ringers  500 mL/hr at 06/17/24 1835   lactated ringers  Stopped (06/14/24 1715)   PRN Meds:.acetaminophen  **OR** acetaminophen , albuterol , naphazoline-glycerin   DVT Prophylaxis   Recent Labs   Lab 06/13/24 1128 06/14/24 0337 06/15/24 1845 06/16/24 0428 06/17/24 0316 06/18/24 0414 06/19/24 0343  WBC 11.2*   < > 25.2* 21.3* 24.3* 22.2* 18.2*  HGB 16.6   < > 8.5* 8.5* 7.5* 7.1* 8.0*  HCT 51.2   < > 26.2* 25.4* 22.8* 21.8* 24.4*  PLT 174   < > 124* 142* 161 184 190  MCV 85.2   < > 83.2 83.8 84.1 85.8 85.0  MCH 27.6   < > 27.0 28.1 27.7 28.0 27.9  MCHC 32.4   < > 32.4 33.5 32.9 32.6 32.8  RDW 14.7   < > 14.7 14.4 14.5 14.4 14.6  LYMPHSABS 1.0  --   --   --  2.1 1.6 2.3  MONOABS 1.3*  --   --   --  1.2* 2.0* 1.2*  EOSABS 0.1  --   --   --  0.0 0.7* 0.4  BASOSABS 0.1  --   --   --  0.1 0.2* 0.0   < > = values in this interval not displayed.    Recent Labs  Lab 06/13/24 1128 06/13/24 1144 06/13/24 1643 06/13/24 1744 06/14/24 9662 06/14/24 1357 06/14/24 1648 06/15/24 0442 06/16/24 0428 06/17/24 0316 06/18/24 0414 06/19/24 0343  NA 136  --   --   --    < >  --   --  132* 133* 136 134* 136  K 4.1  --   --   --    < >  --   --  4.9 4.6 4.5 4.3 4.7  CL 100  --   --   --    < >  --   --  99 99 104 101 104  CO2 25  --   --   --    < >  --   --  24 25 29 28 25   ANIONGAP 11  --   --   --    < >  --   --  9 9 3* 5 6  GLUCOSE 131*  --   --   --    < >  --   --  100* 125* 92 80 79  BUN 16  --   --   --    < >  --   --  38* 34* 25* 23 14  CREATININE 1.39*  --   --   --    < >  --   --  1.81* 1.19 1.06 1.03 0.90  AST 27  --   --   --   --   --   --  34 31  --   --   --   ALT 15  --   --   --   --   --   --  21 25  --   --   --   ALKPHOS 90  --   --   --   --   --   --  54 52  --   --   --   BILITOT 2.3*  --   --   --   --   --   --  0.7 0.6  --   --   --   ALBUMIN 3.3*  --   --   --   --   --   --  2.1* 2.1*  --   --   --   CRP  --   --   --   --   --   --   --   --   --  6.1* 2.9* <3.0*  PROCALCITON  --   --   --   --   --   --   --   --   --  9.33 3.83 2.14  LATICACIDVEN  --  2.3* 5.5* 3.0*  --  2.6* 2.2*  --   --   --   --   --   INR 1.3*  --   --   --   --   --   --    --   --   --   --   --   BNP 96.2  --   --   --   --   --   --   --   --   --   --   --   MG  --   --   --   --   --   --   --   --   --  2.3 1.9 1.9  CALCIUM  8.5*  --   --   --    < >  --   --  7.4* 7.5* 7.8* 7.6* 8.0*   < > = values in this interval not displayed.      Recent Labs  Lab 06/13/24 1128  06/13/24 1144 06/13/24 1643 06/13/24 1744 06/14/24 0337 06/14/24 1357 06/14/24 1648 06/15/24 0442 06/16/24 0428 06/17/24 0316 06/18/24 0414 06/19/24 0343  CRP  --   --   --   --   --   --   --   --   --  6.1* 2.9* <3.0*  PROCALCITON  --   --   --   --   --   --   --   --   --  9.33 3.83 2.14  LATICACIDVEN  --  2.3* 5.5* 3.0*  --  2.6* 2.2*  --   --   --   --   --   INR 1.3*  --   --   --   --   --   --   --   --   --   --   --   BNP 96.2  --   --   --   --   --   --   --   --   --   --   --   MG  --   --   --   --   --   --   --   --   --  2.3 1.9 1.9  CALCIUM  8.5*  --   --   --    < >  --   --  7.4* 7.5* 7.8* 7.6* 8.0*   < > = values in this interval not displayed.    --------------------------------------------------------------------------------------------------------------- Lab Results  Component Value Date   CHOL 123 12/05/2023   HDL 54.60 12/05/2023   LDLCALC 49 12/05/2023   TRIG 99.0 12/05/2023   CHOLHDL 2 12/05/2023    Lab Results  Component Value Date   HGBA1C 5.4 12/05/2023    Radiology Reports  No results found.    Signature  -   Lavada Stank M.D on 06/19/2024 at 10:24 AM   -  To page go to [email or web address]

## 2024-06-19 NOTE — TOC Progression Note (Signed)
 Transition of Care [facility]) - Progression Note    Patient Details  Name: Steven Barnett MRN: [id number] Date of Birth: Aug 12, 1950  [facility]) CM/SW Contact  [name], RN Phone Number: 06/19/2024, 1:37 PM  Clinical Narrative:     HH arranged through [facility] for therapy. Info on AVS.   CM will continue to follow.   Expected Discharge Plan:  (TBD) Barriers to Discharge: Continued Medical Work up               Expected Discharge Plan and Services       Living arrangements for the past 2 months: Single Family Home                                       Social Drivers of Health (SDOH) Interventions SDOH Screenings   Food Insecurity: No Food Insecurity (06/13/2024)  Housing: Low Risk (06/13/2024)  Transportation Needs: No Transportation Needs (06/13/2024)  Utilities: At Risk (06/13/2024)  Depression (PHQ2-9): Low Risk (12/05/2023)  Social Connections: Moderately Isolated (06/13/2024)  Tobacco Use: Medium Risk (06/15/2024)    Readmission Risk Interventions     No data to display

## 2024-06-19 NOTE — Plan of Care (Signed)
   Problem: Education: Goal: Knowledge of General Education information will improve Description Including pain rating scale, medication(s)/side effects and non-pharmacologic comfort measures Outcome: Progressing

## 2024-06-20 DIAGNOSIS — J9601 Acute respiratory failure with hypoxia: Secondary | ICD-10-CM | POA: Diagnosis not present

## 2024-06-20 LAB — CBC WITH DIFFERENTIAL/PLATELET
Abs Immature Granulocytes: 2.23 K/uL — ABNORMAL HIGH (ref 0.00–0.07)
Basophils Absolute: 0.2 K/uL — ABNORMAL HIGH (ref 0.0–0.1)
Basophils Relative: 1 %
Eosinophils Absolute: 0.5 K/uL (ref 0.0–0.5)
Eosinophils Relative: 3 %
HCT: 25.3 % — ABNORMAL LOW (ref 39.0–52.0)
Hemoglobin: 8.2 g/dL — ABNORMAL LOW (ref 13.0–17.0)
Immature Granulocytes: 17 %
Lymphocytes Relative: 15 %
Lymphs Abs: 2 K/uL (ref 0.7–4.0)
MCH: 28.2 pg (ref 26.0–34.0)
MCHC: 32.4 g/dL (ref 30.0–36.0)
MCV: 86.9 fL (ref 80.0–100.0)
Monocytes Absolute: 0.9 K/uL (ref 0.1–1.0)
Monocytes Relative: 7 %
Neutro Abs: 7.5 K/uL (ref 1.7–7.7)
Neutrophils Relative %: 57 %
Platelets: 203 K/uL (ref 150–400)
RBC: 2.91 MIL/uL — ABNORMAL LOW (ref 4.22–5.81)
RDW: 14.8 % (ref 11.5–15.5)
Smear Review: NORMAL
WBC: 13.1 K/uL — ABNORMAL HIGH (ref 4.0–10.5)
nRBC: 4.3 % — ABNORMAL HIGH (ref 0.0–0.2)

## 2024-06-20 LAB — BASIC METABOLIC PANEL WITH GFR
Anion gap: 5 (ref 5–15)
BUN: 12 mg/dL (ref 8–23)
CO2: 26 mmol/L (ref 22–32)
Calcium: 8.3 mg/dL — ABNORMAL LOW (ref 8.9–10.3)
Chloride: 104 mmol/L (ref 98–111)
Creatinine, Ser: 0.95 mg/dL (ref 0.61–1.24)
GFR, Estimated: >60 mL/min (ref >60)
Glucose, Bld: 79 mg/dL (ref 70–99)
Potassium: 4.3 mmol/L (ref 3.5–5.1)
Sodium: 134 mmol/L — ABNORMAL LOW (ref 135–145)

## 2024-06-20 LAB — C-REACTIVE PROTEIN: CRP: <3 mg/dL — ABNORMAL HIGH (ref <1.0)

## 2024-06-20 LAB — MAGNESIUM: Magnesium: 1.9 mg/dL (ref 1.7–2.4)

## 2024-06-20 LAB — PROCALCITONIN: Procalcitonin: 1.29 ng/mL

## 2024-06-20 MED ORDER — FUROSEMIDE 40 MG PO TABS
40.0000 mg | ORAL_TABLET | Freq: Once | ORAL | Status: AC
  Administered 2024-06-20: 13:00:00 40 mg via ORAL
  Filled 2024-06-20: qty 1

## 2024-06-20 NOTE — TOC Progression Note (Signed)
 Transition of Care [facility]) - Progression Note    Patient Details  Name: Steven Barnett MRN: [id number] Date of Birth: August 14, 1950  Transition of Care [facility]) CM/SW Contact  [name], RN Phone Number: 06/20/2024, 3:02 PM  Clinical Narrative:     Therapy rec for DME- RW and BSC. Patient refused need for walker, has a rolling walker at home. Patient refuses the need for [facility].   Expected Discharge Plan:  (TBD) Barriers to Discharge: Continued Medical Work up               Expected Discharge Plan and Services       Living arrangements for the past 2 months: Single Family Home                                       Social Drivers of Health (SDOH) Interventions SDOH Screenings   Food Insecurity: No Food Insecurity (06/13/2024)  Housing: Low Risk (06/13/2024)  Transportation Needs: No Transportation Needs (06/13/2024)  Utilities: At Risk (06/13/2024)  Depression (PHQ2-9): Low Risk (12/05/2023)  Social Connections: Moderately Isolated (06/13/2024)  Tobacco Use: Medium Risk (06/15/2024)    Readmission Risk Interventions     No data to display

## 2024-06-20 NOTE — Plan of Care (Signed)

## 2024-06-20 NOTE — Progress Notes (Signed)
 Physical Therapy Treatment Patient Details Name: Steven Barnett MRN: [id number] DOB: 22-Nov-1950 Today's Date: 06/20/2024   History of Present Illness 73 y/o  male admitted 06/13/24 with hematuria, abdominal pain following recent prostate biopsy.  Patient underwent CBI though ended up needing cystoscopy for clot evacuation, fulguration on 06/15/24.  PMH positive for CHF, a-fib on anticoagulation, h/o ICH, CKD, morbid obesity.    PT Comments  Pt is demonstrating good progress in balance, gait, and overall activity tolerance.  Pt requires light supervision during STS transfer but does well maintaining balance with walker. Pt is demonstrating larger step length and increased cadence while ambulating in the halls, while still demonstrating a safe pace. Stairs trialed today per home set up. Pt benefits from CGA for ascending and descending stairs with B railings for support. Pt tolerate stairs well and demonstrates safe mechanics throughout. One seated rest break taken between bouts of ambulation today. Pt would benefit from continued PT services focused on strength, balance, progressing gait, and stairs to continue to promote independence with functional mobility.    If plan is discharge home, recommend the following: A little help with walking and/or transfers;A little help with bathing/dressing/bathroom;Assistance with cooking/housework;Assist for transportation;Help with stairs or ramp for entrance   Can travel by private vehicle        Equipment Recommendations  Rolling walker (2 wheels);BSC/3in1    Recommendations for Other Services       Precautions / Restrictions Precautions Precautions: Fall Recall of Precautions/Restrictions: Intact Restrictions Weight Bearing Restrictions Per Provider Order: No     Mobility  Bed Mobility               General bed mobility comments: Pt seated in recliner upon arrival.    Transfers Overall transfer level: Needs assistance Equipment  used: Rolling walker (2 wheels) Transfers: Sit to/from Stand Sit to Stand: Supervision           General transfer comment: Pt demonstrating improved STS strength and balance with walker. Assist for line management.    Ambulation/Gait Ambulation/Gait assistance: Contact guard assist Gait Distance (Feet): 110 Feet (110x2, 120') Assistive device: Rolling walker (2 wheels) Gait Pattern/deviations: Step-through pattern, Decreased step length - right, Decreased step length - left, Trunk flexed, Narrow base of support   Gait velocity interpretation: 1.31 - 2.62 ft/sec, indicative of limited community ambulator       Stairs Stairs: Yes Stairs assistance: Contact guard assist Stair Management: Two rails, Step to pattern, Forwards Number of Stairs: 10 General stair comments: Pt with good tolerance during stairs, does well using B rails for assist. Pt states nephew could provide support on stairs at home.   Wheelchair Mobility     Tilt Bed    Modified Rankin (Stroke Patients Only)       Balance Overall balance assessment: Mild deficits observed, not formally tested Sitting-balance support: No upper extremity supported Sitting balance-Leahy Scale: Good Sitting balance - Comments: No sitting balance concerns   Standing balance support: Bilateral upper extremity supported Standing balance-Leahy Scale: Good Standing balance comment: Pt demonstrates good balance with walker during functional mobility. No LOB occurs during session. Pt reports moderate UE use on walker.                            Communication Communication Communication: No apparent difficulties  Cognition Arousal: Alert Behavior During Therapy: WFL for tasks assessed/performed   PT - Cognitive impairments: No apparent impairments  Following commands: Intact      Cueing Cueing Techniques: Verbal cues, Tactile cues, Visual cues  Exercises      General  Comments General comments (skin integrity, edema, etc.): VSS throughout, no new skin abnormalities noted.      Pertinent Vitals/Pain Pain Assessment Pain Assessment: No/denies pain    Home Living                          Prior Function            PT Goals (current goals can now be found in the care plan section) Acute Rehab PT Goals Patient Stated Goal: No new PT goals stated during session Progress towards PT goals: Progressing toward goals    Frequency    Min 2X/week      PT Plan      Co-evaluation              AM-PAC PT 6 Clicks Mobility   Outcome Measure  Help needed turning from your back to your side while in a flat bed without using bedrails?: None Help needed moving from lying on your back to sitting on the side of a flat bed without using bedrails?: A Little Help needed moving to and from a bed to a chair (including a wheelchair)?: A Little Help needed standing up from a chair using your arms (e.g., wheelchair or bedside chair)?: A Little Help needed to walk in hospital room?: A Little Help needed climbing 3-5 steps with a railing? : A Little 6 Click Score: 19    End of Session   Activity Tolerance: Patient tolerated treatment well Patient left: in chair;with call bell/phone within reach Nurse Communication: Mobility status PT Visit Diagnosis: Unsteadiness on feet (R26.81);Muscle weakness (generalized) (M62.81)     Time: 1105-1130 PT Time Calculation (min) (ACUTE ONLY): 25 min  Charges:    $Gait Training: 23-37 mins PT General Charges $$ ACUTE PT VISIT: 1 Visit                     Steven Barnett, PT, DPT  Acute Rehabilitation Services         Office: [phone]     Steven MARLA Barnett 06/20/2024, 1:32 PM

## 2024-06-20 NOTE — Progress Notes (Signed)
 PROGRESS NOTE        PATIENT DETAILS Name: Steven Barnett Age: 73 y.o. Sex: male Date of Birth: [birth year] Admit Date: 06/13/2024 Admitting Physician Maximino DELENA Sharps, MD ERE:[id number], Leita Repine, FNP (Inactive)  Brief Summary: Patient is a 73 y.o.  male with history of A-fib on Eliquis -who recently underwent prostate biopsy on 12/5-presented with hematuria and difficulty urinating-patient was found to have urinary retention in the setting of clot colic-evaluated by urology-Foley inserted-started on continuous bladder irrigation-subsequently blood cultures positive for gram-negative bacteremia.  Significant events: 12/11>> admit to [facility]-hematuria-urinary retention-Foley placed-CBI started 12/12>> urinary retention while on CBI-no output obtained in spite of frequent flushes-urology consulted-new French 24 hematuria catheter placed-800 cc of bloody urine obtained.  Significant studies: 12/11>> CXR: No acute cardiopulmonary findings 12/11>> CT head: No acute intracranial process  Significant microbiology data: 12/11>> COVID PCR: Negative 12/11>> blood culture: E. coli  Procedures: Underwent prostate biopsy with Dr. Devere 06/07/2024.  Presented 06/13/2024 with gross hematuria and difficulty urinating after starting Eliquis  and developing gross hematuria.   He was initially irrigated 12/11 and 12/12 by Dr. Nieves with a 24 French three-way foley.    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     Consults: Urology  Subjective:  Patient in bed, appears comfortable, denies any headache, no fever, no chest pain or pressure, no shortness of breath , no abdominal pain. No new focal weakness.  Objective: Vitals: Blood pressure 128/74, pulse 81, temperature 98.6 F (37 C), temperature source Oral, resp. rate 16, height 5' 10 (1.778 m), weight 120.1 kg, SpO2 96%.   Exam:  Awake Alert, No new F.N  deficits, Normal affect [place].AT,PERRAL Supple Neck, No JVD,   Symmetrical Chest wall movement, Good air movement bilaterally, CTAB RRR,No Gallops, Rubs or new Murmurs,  +ve B.Sounds, Abd Soft, No tenderness, Foley in place No Cyanosis, Clubbing or edema     Assessment/Plan:  Gross hematuria with clot retention leading to acute urinary retention (in the setting of recent prostate biopsy/Eliquis  use) acute blood loss related anemia. S/p Foley catheter insertion/CBI-and TXA on 12/11-unfortunately even while on CBI-redeveloped clot retention on 12/12-Foley catheter was subsequently exchanged-per urology-although urine is pink-tinged today-he subsequently underwent cystoscopy on 06/15/2024 by urology, clear urine now, continue to monitor for any output, if failed Foley catheter removal trial by urology on 06/17/2024, new catheter to be placed 06/18/2024, urology on board. Continue to hold Eliquis , trial of full subcu heparin   continue Flomax /finasteride  Continue CBI Defer further to urology service. Getting 1 unit of packed RBC on 06/18/2024, stable, monitor for another 24 hours.   Severe sepsis with hypotension secondary to E. coli bacteremia (POA) Likely result of above/recent prostate biopsy On IV Rocephin  Was hypotensive on 12/12 requiring numerous fluid boluses and initiation of midodrine  BP has now stabilized-however continues to have persistent leukocytosis-concern that they may be clot retention again-awaiting CT abdomen study. Continue IV Rocephin   AKI Probably secondary to obstructive uropathy due to urinary retention-and some amount of hemodynamic injury in the setting of sepsis Renal function improving Avoid nephrotoxic agents  Acute metabolic encephalopathy Likely secondary to bacteremia/AKI-seems to have resolved-patient is relatively awake and alert this morning.  ?  Hypoxia Not certain if this is a true pulse ox reading No evidence of PNA or CHF clinically-on Eliquis -so  doubt VTE Will attempt to titrate  oxygen and see how he does  History of chronic HFpEF Euvolemic Due to hypotension-hold all diuretics  HTN Hold Cardizem -hypotensive this morning  History of ICH 2023 In the setting of Coumadin  use-has been switched to Eliquis  but currently on hold Supportive care  OSA Monitor-start CPAP if needed.  Class II obesity: Estimated body mass index is 37.99 kg/m as calculated from the following:   Height as of this encounter: 5' 10 (1.778 m).   Weight as of this encounter: 120.1 kg.    Code status:   Code Status: Full Code   DVT Prophylaxis:SCD's - Heparin  SQ TID added 06/19/24  Family Communication: None at bedside   Disposition Plan: Status is: Inpatient Remains inpatient appropriate because: Severity of illness   Planned Discharge Destination:Home   Diet: Diet Order             Diet Heart Room service appropriate? Yes; Fluid consistency: Thin  Diet effective now                     Data Review:   Patient Lines/Drains/Airways Status     Active Line/Drains/Airways     Name Placement date Placement time Site Days   Peripheral IV 06/13/24 20 G Right Antecubital 06/13/24  1200  Antecubital  7   Peripheral IV 06/13/24 20 G Left Antecubital 06/13/24  2200  Antecubital  7   Urethral Catheter Hannah Coude 18 Fr. 06/18/24  1006  Coude  2             Inpatient Medications  Scheduled Meds:  sodium chloride    Intravenous Once   allopurinol   100 mg Oral Daily   Chlorhexidine  Gluconate Cloth  6 each Topical Daily   finasteride   5 mg Oral Daily   heparin  injection (subcutaneous)  5,000 Units Subcutaneous Q8H   midodrine   10 mg Oral TID WC   sodium chloride  flush  3 mL Intravenous Q12H   tamsulosin   0.4 mg Oral QPC supper   Continuous Infusions:  cefTRIAXone  (ROCEPHIN )  IV 2 g (06/19/24 1430)   lactated ringers  500 mL/hr at 06/17/24 1835   lactated ringers  Stopped (06/14/24 1715)   PRN Meds:.acetaminophen  **OR**  acetaminophen , albuterol , naphazoline-glycerin   DVT Prophylaxis  heparin  injection 5,000 Units Start: 06/20/24 0600 Recent Labs  Lab 06/13/24 1128 06/14/24 0337 06/16/24 0428 06/17/24 0316 06/18/24 0414 06/19/24 0343 06/20/24 0353  WBC 11.2*   < > 21.3* 24.3* 22.2* 18.2* 13.1*  HGB 16.6   < > 8.5* 7.5* 7.1* 8.0* 8.2*  HCT 51.2   < > 25.4* 22.8* 21.8* 24.4* 25.3*  PLT 174   < > 142* 161 184 190 203  MCV 85.2   < > 83.8 84.1 85.8 85.0 86.9  MCH 27.6   < > 28.1 27.7 28.0 27.9 28.2  MCHC 32.4   < > 33.5 32.9 32.6 32.8 32.4  RDW 14.7   < > 14.4 14.5 14.4 14.6 14.8  LYMPHSABS 1.0  --   --  2.1 1.6 2.3 2.0  MONOABS 1.3*  --   --  1.2* 2.0* 1.2* 0.9  EOSABS 0.1  --   --  0.0 0.7* 0.4 0.5  BASOSABS 0.1  --   --  0.1 0.2* 0.0 0.2*   < > = values in this interval not displayed.    Recent Labs  Lab 06/13/24 1128 06/13/24 1144 06/13/24 1643 06/13/24 1744 06/14/24 9662 06/14/24 1357 06/14/24 1648 06/15/24 9557 06/16/24 9571 06/17/24 9683 06/18/24 9585 06/19/24 9656 06/20/24 9646  NA 136  --   --   --    < >  --   --  132* 133* 136 134* 136 134*  K 4.1  --   --   --    < >  --   --  4.9 4.6 4.5 4.3 4.7 4.3  CL 100  --   --   --    < >  --   --  99 99 104 101 104 104  CO2 25  --   --   --    < >  --   --  24 25 29 28 25 26   ANIONGAP 11  --   --   --    < >  --   --  9 9 3* 5 6 5   GLUCOSE 131*  --   --   --    < >  --   --  100* 125* 92 80 79 79  BUN 16  --   --   --    < >  --   --  38* 34* 25* 23 14 12   CREATININE 1.39*  --   --   --    < >  --   --  1.81* 1.19 1.06 1.03 0.90 0.95  AST 27  --   --   --   --   --   --  34 31  --   --   --   --   ALT 15  --   --   --   --   --   --  21 25  --   --   --   --   ALKPHOS 90  --   --   --   --   --   --  54 52  --   --   --   --   BILITOT 2.3*  --   --   --   --   --   --  0.7 0.6  --   --   --   --   ALBUMIN 3.3*  --   --   --   --   --   --  2.1* 2.1*  --   --   --   --   CRP  --   --   --   --   --   --   --   --   --  6.1* 2.9*  <3.0* <3.0*  PROCALCITON  --   --   --   --   --   --   --   --   --  9.33 3.83 2.14 1.29  LATICACIDVEN  --  2.3* 5.5* 3.0*  --  2.6* 2.2*  --   --   --   --   --   --   INR 1.3*  --   --   --   --   --   --   --   --   --   --   --   --   BNP 96.2  --   --   --   --   --   --   --   --   --   --   --   --   MG  --   --   --   --   --   --   --   --   --  2.3 1.9 1.9 1.9  CALCIUM  8.5*  --   --   --    < >  --   --  7.4* 7.5* 7.8* 7.6* 8.0* 8.3*   < > = values in this interval not displayed.      Recent Labs  Lab 06/13/24 1128 06/13/24 1144 06/13/24 1643 06/13/24 1744 06/14/24 0337 06/14/24 1357 06/14/24 1648 06/15/24 0442 06/16/24 0428 06/17/24 0316 06/18/24 0414 06/19/24 0343 06/20/24 0353  CRP  --   --   --   --   --   --   --   --   --  6.1* 2.9* <3.0* <3.0*  PROCALCITON  --   --   --   --   --   --   --   --   --  9.33 3.83 2.14 1.29  LATICACIDVEN  --  2.3* 5.5* 3.0*  --  2.6* 2.2*  --   --   --   --   --   --   INR 1.3*  --   --   --   --   --   --   --   --   --   --   --   --   BNP 96.2  --   --   --   --   --   --   --   --   --   --   --   --   MG  --   --   --   --   --   --   --   --   --  2.3 1.9 1.9 1.9  CALCIUM  8.5*  --   --   --    < >  --   --    < > 7.5* 7.8* 7.6* 8.0* 8.3*   < > = values in this interval not displayed.    --------------------------------------------------------------------------------------------------------------- Lab Results  Component Value Date   CHOL 123 12/05/2023   HDL 54.60 12/05/2023   LDLCALC 49 12/05/2023   TRIG 99.0 12/05/2023   CHOLHDL 2 12/05/2023    Lab Results  Component Value Date   HGBA1C 5.4 12/05/2023    Radiology Reports  No results found.    Signature  -   Lavada Stank M.D on 06/20/2024 at 10:01 AM   -  To page go to [email or web address]

## 2024-06-21 ENCOUNTER — Encounter: Payer: Self-pay | Admitting: Hematology

## 2024-06-21 DIAGNOSIS — J9601 Acute respiratory failure with hypoxia: Secondary | ICD-10-CM | POA: Diagnosis not present

## 2024-06-21 LAB — CBC WITH DIFFERENTIAL/PLATELET
Abs Immature Granulocytes: 1.11 K/uL — ABNORMAL HIGH (ref 0.00–0.07)
Basophils Absolute: 0.1 K/uL (ref 0.0–0.1)
Basophils Relative: 1 %
Eosinophils Absolute: 0.4 K/uL (ref 0.0–0.5)
Eosinophils Relative: 4 %
HCT: 24.7 % — ABNORMAL LOW (ref 39.0–52.0)
Hemoglobin: 8.1 g/dL — ABNORMAL LOW (ref 13.0–17.0)
Immature Granulocytes: 12 %
Lymphocytes Relative: 18 %
Lymphs Abs: 1.7 K/uL (ref 0.7–4.0)
MCH: 28.3 pg (ref 26.0–34.0)
MCHC: 32.8 g/dL (ref 30.0–36.0)
MCV: 86.4 fL (ref 80.0–100.0)
Monocytes Absolute: 0.7 K/uL (ref 0.1–1.0)
Monocytes Relative: 8 %
Neutro Abs: 5.6 K/uL (ref 1.7–7.7)
Neutrophils Relative %: 57 %
Platelets: 203 K/uL (ref 150–400)
RBC: 2.86 MIL/uL — ABNORMAL LOW (ref 4.22–5.81)
RDW: 16.1 % — ABNORMAL HIGH (ref 11.5–15.5)
Smear Review: NORMAL
WBC: 9.6 K/uL (ref 4.0–10.5)
nRBC: 4.5 % — ABNORMAL HIGH (ref 0.0–0.2)

## 2024-06-21 MED ORDER — FOLIC ACID 1 MG PO TABS
1.0000 mg | ORAL_TABLET | Freq: Every day | ORAL | Status: DC
  Administered 2024-06-21: 1 mg via ORAL
  Filled 2024-06-21: qty 1

## 2024-06-21 MED ORDER — CIPROFLOXACIN HCL 500 MG PO TABS
500.0000 mg | ORAL_TABLET | Freq: Two times a day (BID) | ORAL | 0 refills | Status: AC
  Filled 2024-06-21: qty 8, 4d supply, fill #0

## 2024-06-21 MED ORDER — FINASTERIDE 5 MG PO TABS
5.0000 mg | ORAL_TABLET | Freq: Every day | ORAL | 0 refills | Status: AC
  Filled 2024-06-21: qty 30, 30d supply, fill #0

## 2024-06-21 MED ORDER — FERROUS SULFATE 325 (65 FE) MG PO TABS
325.0000 mg | ORAL_TABLET | Freq: Every day | ORAL | 0 refills | Status: AC
  Filled 2024-06-21: qty 30, 30d supply, fill #0

## 2024-06-21 MED ORDER — CEPHALEXIN 500 MG PO CAPS
500.0000 mg | ORAL_CAPSULE | Freq: Three times a day (TID) | ORAL | 0 refills | Status: DC
  Filled 2024-06-21: qty 9, 3d supply, fill #0

## 2024-06-21 MED ORDER — MIDODRINE HCL 5 MG PO TABS
5.0000 mg | ORAL_TABLET | Freq: Two times a day (BID) | ORAL | Status: DC
  Administered 2024-06-21: 5 mg via ORAL
  Filled 2024-06-21: qty 1

## 2024-06-21 MED ORDER — FERROUS SULFATE 325 (65 FE) MG PO TABS
325.0000 mg | ORAL_TABLET | Freq: Every day | ORAL | Status: DC
  Administered 2024-06-21: 325 mg via ORAL
  Filled 2024-06-21: qty 1

## 2024-06-21 MED ORDER — FOLIC ACID 1 MG PO TABS
1.0000 mg | ORAL_TABLET | Freq: Every day | ORAL | 0 refills | Status: AC
  Filled 2024-06-21: qty 30, 30d supply, fill #0

## 2024-06-21 MED ORDER — MIDODRINE HCL 5 MG PO TABS
5.0000 mg | ORAL_TABLET | Freq: Two times a day (BID) | ORAL | 0 refills | Status: AC
  Filled 2024-06-21: qty 20, 10d supply, fill #0

## 2024-06-21 NOTE — Discharge Summary (Signed)
 "                                                                                                                                                                               Discharge summary note.  Steven Barnett FMW:[id number] DOB: 16-Dec-1950 DOA: 06/13/2024  PCP: Jason Leita Repine, FNP (Inactive)  Admit date: 06/13/2024  Discharge date: 06/21/2024  Admitted From: Home   Disposition:  Home   Recommendations for Outpatient Follow-up:   Follow up with PCP in 1-2 weeks  PCP Please obtain BMP/CBC, 2 view CXR in 1week,  (see Discharge instructions)   PCP Please follow up on the following pending results:    Home Health: PT, RN if qualifies Equipment/Devices: Indwelling Foley catheter Consultations: Urology Discharge Condition: Stable    CODE STATUS: Full    Diet Recommendation: Heart Healthy   Diet Order             Diet Heart Room service appropriate? Yes; Fluid consistency: Thin  Diet effective now                    Chief Complaint  Patient presents with   Shortness of Breath     Brief history of present illness from the day of admission and additional interim summary    73 y.o.  male with history of A-fib on Eliquis -who recently underwent prostate biopsy on 12/5-presented with hematuria and difficulty urinating-patient was found to have urinary retention in the setting of clot colic-evaluated by urology-Foley inserted-started on continuous bladder irrigation-subsequently blood cultures positive for gram-negative bacteremia.   Significant events: 12/11>> admit to [facility]-hematuria-urinary retention-Foley placed-CBI started 12/12>> urinary retention while on CBI-no output obtained in spite of frequent flushes-urology consulted-new French 24 hematuria catheter placed-800 cc of bloody urine obtained.   Significant studies: 12/11>> CXR: No acute cardiopulmonary findings 12/11>> CT head: No acute intracranial process   Significant microbiology data: 12/11>>  COVID PCR: Negative 12/11>> blood culture: E. coli   Procedures: Underwent prostate biopsy with Dr. Devere 06/07/2024.  Presented 06/13/2024 with gross hematuria and difficulty urinating after starting Eliquis  and developing gross hematuria.   He was initially irrigated 12/11 and 12/12 by Dr. Nieves with a 24 French three-way foley.     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  Hospital Course   Gross hematuria with clot retention leading to acute urinary retention (in the setting of recent prostate biopsy/Eliquis  use) acute blood loss related anemia. S/p Foley catheter insertion/CBI-and TXA on 12/11-unfortunately even while on CBI-redeveloped clot retention on 12/12-Foley catheter was subsequently exchanged-per urology-although urine is pink-tinged today-he subsequently underwent cystoscopy on 06/15/2024 by urology, clear urine now, continue to monitor for any output,  he failed Foley catheter removal trial by urology on 06/17/2024, new catheter to be placed 06/18/2024, urology on board.  Now urine is clear, continue alpha-blocker, will be discharged with Foley catheter and alpha-blocker with outpatient urology follow-up.  He did require 1 unit of packed RBC transfusion on 06/18/2024.  CBC stable, will resume his Eliquis  from today, urine has remained clear for at least 3 days, PCP to monitor CBC and arrange for quick outpatient urology follow-up.    Severe sepsis with hypotension secondary to E. coli bacteremia (POA) Likely result of above/recent prostate biopsy Was on IV Rocephin  finished 7 days will get Cipro  for 4 more days to complete a total of 10-day course, clinically sepsis is completely resolved he is symptom-free, he still has Foley catheter as he had failed removal trial, has been placed on alpha-blocker will follow-up with urology in a week  postdischarge.   AKI Probably secondary to obstructive uropathy due to urinary retention-and some amount of hemodynamic injury in the setting of sepsis Renal function improving CP to monitor for now avoiding nephrotoxic agents   Acute metabolic encephalopathy Likely secondary to bacteremia/AKI-seems to have resolved-back to baseline   ?  Hypoxia Resolved stable on room air   History of chronic HFpEF Euvolemic Commence home diuretic except Jardiance    HTN Blood pressure gradually improving currently on Cardizem , lisinopril  and Jardiance  held, tapering off midodrine , PCP to monitor and adjust   History of ICH 2023 In the setting of Coumadin  use-has been switched to Eliquis , resume and monitor with caution   OSA No acute issue   Class II obesity: Estimated body mass index is 37.99 kg/m, follow-up with PCP    Discharge diagnosis     Principal Problem:   Acute respiratory failure with hypoxia (HCC) Active Problems:   Chronic bronchitis (HCC)   Acute kidney injury   Hematuria   SIRS (systemic inflammatory response syndrome) (HCC)   Leukocytosis   Lactic acidosis   Acute metabolic encephalopathy   Atrial fibrillation (HCC)   Chronic diastolic (congestive) heart failure (HCC)   Essential hypertension, benign   Gout   History of intracranial hemorrhage   Morbid obesity (HCC)   Obstructive sleep apnea    Discharge instructions    Discharge Instructions     Discharge instructions   Complete by: As directed    Follow with Primary MD Jason Leita Repine, FNP (Inactive) in 7 days follow-up with the recommended urologist within a week  Get CBC, CMP, Magnesium , 2 view Chest X ray -  checked next visit with your primary MD   Activity: As tolerated with Full fall precautions use walker/cane & assistance as needed  Disposition Home    Diet: Heart Healthy    Special Instructions: If you have smoked or chewed Tobacco  in the last 2 yrs please stop smoking, stop any  regular Alcohol  and or any Recreational drug use.  On your next visit with your primary care physician please Get Medicines reviewed and adjusted.  Please request your Prim.MD to go over all Hospital Tests and Procedure/Radiological results at the follow up, please  get all Hospital records sent to your Prim MD by signing hospital release before you go home.  If you experience worsening of your admission symptoms, develop shortness of breath, life threatening emergency, suicidal or homicidal thoughts you must seek medical attention immediately by calling 911 or calling your MD immediately  if symptoms less severe.  You Must read complete instructions/literature along with all the possible adverse reactions/side effects for all the Medicines you take and that have been prescribed to you. Take any new Medicines after you have completely understood and accpet all the possible adverse reactions/side effects.   Do not drive when taking Pain medications.  Do not take more than prescribed Pain, Sleep and Anxiety Medications  Wear Seat belts while driving.   Increase activity slowly   Complete by: As directed        Discharge Medications   Allergies as of 06/21/2024   No Known Allergies      Medication List     STOP taking these medications    empagliflozin  10 MG Tabs tablet Commonly known as: Jardiance    lisinopril  5 MG tablet Commonly known as: ZESTRIL        TAKE these medications    acetaminophen  500 MG tablet Commonly known as: TYLENOL  Take 1 tablet (500 mg total) by mouth every 6 (six) hours as needed. Take 1 tablet 500 mg 3 times daily x5 days, then every 6 hours as needed. What changed:  how much to take when to take this   albuterol  108 (90 Base) MCG/ACT inhaler Commonly known as: VENTOLIN  HFA Inhale 2 puffs into the lungs every 6 (six) hours as needed for wheezing or shortness of breath.   allopurinol  100 MG tablet Commonly known as: ZYLOPRIM  Take 1 tablet (100  mg total) by mouth daily.   aspirin  EC 81 MG tablet Take 81 mg by mouth every morning.   ciprofloxacin  500 MG tablet Commonly known as: CIPRO  Take 1 tablet (500 mg total) by mouth 2 (two) times daily for 4 days.   colchicine  0.6 MG tablet Take 1 tablet (0.6 mg total) by mouth daily.   diltiazem  120 MG tablet Commonly known as: CARDIZEM  Take 1 tablet (120 mg total) by mouth 3 (three) times daily. What changed: when to take this   Eliquis  5 MG Tabs tablet Generic drug: apixaban  TAKE 1 TABLET EVERY MORNING AND TAKE 1 TABLET AT BEDTIME What changed: Another medication with the same name was removed. Continue taking this medication, and follow the directions you see here.   ferrous sulfate  325 (65 FE) MG tablet Take 1 tablet (325 mg total) by mouth daily with breakfast.   finasteride  5 MG tablet Commonly known as: PROSCAR  Take 1 tablet (5 mg total) by mouth daily.   folic acid 1 MG tablet Commonly known as: FOLVITE Take 1 tablet (1 mg total) by mouth daily.   furosemide  40 MG tablet Commonly known as: LASIX  TAKE 1 TABLET EVERY DAY (NEED MD APPOINTMENT) What changed: See the new instructions.   MAGnesium -Oxide 400 (240 Mg) MG tablet Generic drug: magnesium  oxide Take 1 tablet by mouth 2 (two) times daily.   midodrine  5 MG tablet Commonly known as: PROAMATINE  Take 1 tablet (5 mg total) by mouth 2 (two) times daily with a meal.   potassium chloride  10 MEQ tablet Commonly known as: KLOR-CON  Take 1 tablet (10 mEq total) by mouth daily.         Contact information for follow-up providers     Jason Leita Repine,  FNP. Schedule an appointment as soon as possible for a visit in 1 week(s).   Specialty: Internal Medicine Contact information: [address] [place] [address] [phone]         Nieves Cough, MD. Schedule an appointment as soon as possible for a visit in 1 week(s).   Specialty: Urology Why: Post cystoscopy, indwelling  Foley Contact information: [address] [place] [address] [phone]              Contact information for after-discharge care     Home Medical Care     [facility]) .   Service: Home Health Services Contact information: [address] [place]  [address] [phone]                     Major procedures and Radiology Reports - PLEASE review detailed and final reports thoroughly  -       CT RENAL STONE STUDY Result Date: 06/15/2024 CLINICAL DATA:  Abdominal and flank pain.  Kidney stones suspected. EXAM: CT ABDOMEN AND PELVIS WITHOUT CONTRAST TECHNIQUE: Multidetector CT imaging of the abdomen and pelvis was performed following the standard protocol without IV contrast. RADIATION DOSE REDUCTION: This exam was performed according to the departmental dose-optimization program which includes automated exposure control, adjustment of the mA and/or kV according to patient size and/or use of iterative reconstruction technique. COMPARISON:  08/15/2020 FINDINGS: Lower chest: Scarring/pleural thickening at the right base is stable in the interval. Hepatobiliary: No suspicious focal abnormality in the liver on this study without intravenous contrast. There is no evidence for gallstones, gallbladder wall thickening, or pericholecystic fluid. No intrahepatic or extrahepatic biliary dilation. Pancreas: No focal mass lesion. No dilatation of the main duct. No intraparenchymal cyst. No peripancreatic edema. Spleen: No splenomegaly. No suspicious focal mass lesion. Adrenals/Urinary Tract: No adrenal nodule or mass. Kidneys unremarkable. No evidence for hydroureter. Foley balloon is identified in the bladder lumen. Bladder lumen is filled with high density material. Circumferential gas in the bladder wall along the mucosal surface is compatible with emphysematous cystitis. Tiny gas collections anterior to the low bladder appear to be  extraluminal (see sagittal image 85 of series 7) with a linear gas collection anterior to the bladder potentially intravascular (sagittal 78/7). Gas along the right pelvic sidewall has a tubular configuration may also be intravascular (image 72/3). Stomach/Bowel: Stomach is unremarkable. No gastric wall thickening. No evidence of outlet obstruction. Duodenum is normally positioned as is the ligament of Treitz. No small bowel wall thickening. No small bowel dilatation. Right hemicolectomy. No gross colonic mass. No colonic wall thickening. Vascular/Lymphatic: There is mild atherosclerotic calcification of the abdominal aorta without aneurysm. There is no gastrohepatic or hepatoduodenal ligament lymphadenopathy. No retroperitoneal or mesenteric lymphadenopathy. No pelvic sidewall lymphadenopathy. Reproductive: Prostate gland is enlarged. Other: No free fluid in the abdomen or pelvis. Edema is identified in the soft tissues of the pelvic floor. Musculoskeletal: Left greater than right groin hernias contain only fat. No worrisome lytic or sclerotic osseous abnormality. IMPRESSION: 1. Circumferential gas in the bladder wall along the mucosal surface is compatible with emphysematous cystitis. Tiny gas collections anterior to the low bladder appear to be extraluminal. Linear/tubular gas collection seen anterior to the bladder and along the right pelvic sidewall suggest intravascular, likely venous, gas. 2. High density material in the bladder lumen is compatible with blood products/clot. 3. Prostatomegaly. 4. Left greater than right groin hernias contain only  fat. 5. Aortic Atherosclerosis (ICD10-I70.0) and Emphysema (ICD10-J43.9). These results will be called to the ordering clinician or representative by the Radiologist Assistant, and communication documented in the PACS or [employer]. Electronically Signed   By: Camellia Candle M.D.   On: 06/15/2024 12:37   CT HEAD WO CONTRAST ( ) Result Date:  06/13/2024 CLINICAL DATA:  Altered level of consciousness EXAM: CT HEAD WITHOUT CONTRAST TECHNIQUE: Contiguous axial images were obtained from the base of the skull through the vertex without intravenous contrast. RADIATION DOSE REDUCTION: This exam was performed according to the departmental dose-optimization program which includes automated exposure control, adjustment of the mA and/or kV according to patient size and/or use of iterative reconstruction technique. COMPARISON:  10/13/2021 FINDINGS: Brain: Stable chronic small-vessel ischemic changes throughout the periventricular white matter. No evidence of acute infarct or hemorrhage. Lateral ventricles and midline structures are stable. No acute extra-axial fluid collections. No mass effect. Vascular: No hyperdense vessel or unexpected calcification. Skull: Normal. Negative for fracture or focal lesion. Sinuses/Orbits: No acute finding. Other: None. IMPRESSION: 1. No acute intracranial process. Electronically Signed   By: Ozell Daring M.D.   On: 06/13/2024 20:27   DG Chest Portable 1 View Result Date: 06/13/2024 CLINICAL DATA:  Shortness of breath. EXAM: PORTABLE CHEST 1 VIEW COMPARISON:  06/03/2024. FINDINGS: Stable cardiomegaly. Similar retrocardiac densities on the right, may relate to dilated esophagus. No overt pulmonary edema, focal consolidation, pleural effusion, or pneumothorax. No acute osseous abnormality. IMPRESSION: No acute cardiopulmonary findings. Electronically Signed   By: Harrietta Sherry M.D.   On: 06/13/2024 12:27   DG Chest 2 View Result Date: 06/03/2024 CLINICAL DATA:  Chest pain. EXAM: CHEST - 2 VIEW COMPARISON:  11/15/2022 FINDINGS: Stable mild cardiomegaly. Stable retrocardiac densities on the right side probably related to a dilated esophagus. Chronic mild blunting at the right costophrenic angle. No pulmonary edema. No new airspace disease or consolidation. Negative for a pneumothorax. IMPRESSION: 1. No acute cardiopulmonary  disease. 2. Stable cardiomegaly. 3. Stable retrocardiac densities on the right side probably related to dilated esophagus. Electronically Signed   By: Juliene Balder M.D.   On: 06/03/2024 13:52    Micro Results     Recent Results (from the past 240 hours)  Blood Culture (routine x 2)     Status: Abnormal   Collection Time: 06/13/24 11:50 AM   Specimen: BLOOD  Result Value Ref Range Status   Specimen Description BLOOD RIGHT ANTECUBITAL  Final   Special Requests   Final    BOTTLES DRAWN AEROBIC AND ANAEROBIC Blood Culture adequate volume   Culture  Setup Time   Final    GRAM NEGATIVE RODS IN BOTH AEROBIC AND ANAEROBIC BOTTLES CRITICAL VALUE NOTED.  VALUE IS CONSISTENT WITH PREVIOUSLY REPORTED AND CALLED VALUE.    Culture (A)  Final    ESCHERICHIA COLI SUSCEPTIBILITIES PERFORMED ON PREVIOUS CULTURE WITHIN THE LAST 5 DAYS. Performed at [facility], [address], [place], [place] [address]    Report Status 06/16/2024 FINAL  Final  Blood Culture (routine x 2)     Status: Abnormal   Collection Time: 06/13/24 12:18 PM   Specimen: BLOOD LEFT HAND  Result Value Ref Range Status   Specimen Description BLOOD LEFT HAND  Final   Special Requests   Final    BOTTLES DRAWN AEROBIC AND ANAEROBIC Blood Culture adequate volume   Culture  Setup Time   Final    GRAM NEGATIVE RODS IN BOTH AEROBIC AND ANAEROBIC BOTTLES CRITICAL RESULT  CALLED TO, READ BACK BY AND VERIFIED WITH: PHARMD  J LEDFORD 06/04/2024 @ 0140 BY AB Performed at [facility], [address] [place], [place] [address]    Culture ESCHERICHIA COLI (A)  Final   Report Status 06/16/2024 FINAL  Final   Organism ID, Bacteria ESCHERICHIA COLI  Final      Susceptibility   Escherichia coli - MIC*    AMPICILLIN 16 INTERMEDIATE Intermediate     CEFAZOLIN  (NON-URINE) 16 RESISTANT Resistant     CEFEPIME  <=0.12 SENSITIVE Sensitive     ERTAPENEM <=0.12 SENSITIVE Sensitive     CEFTRIAXONE  1 SENSITIVE Sensitive     CIPROFLOXACIN   <=0.06 SENSITIVE Sensitive     GENTAMICIN  <=1 SENSITIVE Sensitive     MEROPENEM <=0.25 SENSITIVE Sensitive     TRIMETH/SULFA <=20 SENSITIVE Sensitive     AMPICILLIN/SULBACTAM 4 SENSITIVE Sensitive     PIP/TAZO Value in next row Sensitive      <=4 SENSITIVEThis is a modified FDA-approved test that has been validated and its performance characteristics determined by the reporting laboratory.  This laboratory is certified under the Clinical Laboratory Improvement Amendments CLIA as qualified to perform high complexity clinical laboratory testing.    * ESCHERICHIA COLI  Blood Culture ID Panel (Reflexed)     Status: Abnormal   Collection Time: 06/13/24 12:18 PM  Result Value Ref Range Status   Enterococcus faecalis NOT DETECTED NOT DETECTED Corrected   Enterococcus Faecium NOT DETECTED NOT DETECTED Corrected   Listeria monocytogenes NOT DETECTED NOT DETECTED Corrected   Staphylococcus species NOT DETECTED NOT DETECTED Corrected   Staphylococcus aureus (BCID) NOT DETECTED NOT DETECTED Corrected   Staphylococcus epidermidis NOT DETECTED NOT DETECTED Corrected   Staphylococcus lugdunensis NOT DETECTED NOT DETECTED Corrected   Streptococcus species NOT DETECTED NOT DETECTED Corrected   Streptococcus agalactiae NOT DETECTED NOT DETECTED Corrected   Streptococcus pneumoniae NOT DETECTED NOT DETECTED Corrected   Streptococcus pyogenes NOT DETECTED NOT DETECTED Corrected   A.calcoaceticus-baumannii NOT DETECTED NOT DETECTED Corrected   Bacteroides fragilis NOT DETECTED NOT DETECTED Corrected   Enterobacterales DETECTED (A) NOT DETECTED Corrected    Comment: Enterobacterales represent a large order of gram negative bacteria, not a single organism. CRITICAL RESULT CALLED TO, READ BACK BY AND VERIFIED WITH: PHARMD  J LEDFORD 06/04/2024 @ 0140 BY AB    Enterobacter cloacae complex NOT DETECTED NOT DETECTED Corrected   Escherichia coli DETECTED (A) NOT DETECTED Corrected    Comment: CRITICAL RESULT  CALLED TO, READ BACK BY AND VERIFIED WITH: PHARMD  J LEDFORD 06/04/2024 @ 0140 BY AB    Klebsiella aerogenes NOT DETECTED NOT DETECTED Corrected   Klebsiella oxytoca NOT DETECTED NOT DETECTED Corrected   Klebsiella pneumoniae NOT DETECTED NOT DETECTED Corrected   Proteus species NOT DETECTED NOT DETECTED Corrected   Salmonella species NOT DETECTED NOT DETECTED Corrected   Serratia marcescens NOT DETECTED NOT DETECTED Corrected   Haemophilus influenzae NOT DETECTED NOT DETECTED Corrected   Neisseria meningitidis NOT DETECTED NOT DETECTED Corrected   Pseudomonas aeruginosa NOT DETECTED NOT DETECTED Corrected   Stenotrophomonas maltophilia NOT DETECTED NOT DETECTED Corrected   Candida albicans NOT DETECTED NOT DETECTED Corrected   Candida auris NOT DETECTED NOT DETECTED Corrected   Candida glabrata NOT DETECTED NOT DETECTED Corrected   Candida krusei NOT DETECTED NOT DETECTED Corrected   Candida parapsilosis NOT DETECTED NOT DETECTED Corrected   Candida tropicalis NOT DETECTED NOT DETECTED Corrected   Cryptococcus neoformans/gattii NOT DETECTED NOT DETECTED Corrected   CTX-M ESBL NOT  DETECTED NOT DETECTED Corrected   Carbapenem resistance IMP NOT DETECTED NOT DETECTED Corrected   Carbapenem resistance KPC NOT DETECTED NOT DETECTED Corrected   Carbapenem resistance NDM NOT DETECTED NOT DETECTED Corrected   Carbapenem resist OXA 48 LIKE NOT DETECTED NOT DETECTED Corrected   Carbapenem resistance VIM NOT DETECTED NOT DETECTED Corrected    Comment: Performed at [facility], [address] [place], [place] [address]  Respiratory (~20 pathogens) panel by PCR     Status: None   Collection Time: 06/13/24  5:17 PM   Specimen: Nasopharyngeal Swab; Respiratory  Result Value Ref Range Status   Adenovirus NOT DETECTED NOT DETECTED Final   Coronavirus 229E NOT DETECTED NOT DETECTED Final    Comment: (NOTE) The Coronavirus on the Respiratory Panel, DOES NOT test for the novel  Coronavirus  (2019 nCoV)    Coronavirus HKU1 NOT DETECTED NOT DETECTED Final   Coronavirus NL63 NOT DETECTED NOT DETECTED Final   Coronavirus OC43 NOT DETECTED NOT DETECTED Final   Metapneumovirus NOT DETECTED NOT DETECTED Final   Rhinovirus / Enterovirus NOT DETECTED NOT DETECTED Final   Influenza A NOT DETECTED NOT DETECTED Final   Influenza B NOT DETECTED NOT DETECTED Final   Parainfluenza Virus 1 NOT DETECTED NOT DETECTED Final   Parainfluenza Virus 2 NOT DETECTED NOT DETECTED Final   Parainfluenza Virus 3 NOT DETECTED NOT DETECTED Final   Parainfluenza Virus 4 NOT DETECTED NOT DETECTED Final   Respiratory Syncytial Virus NOT DETECTED NOT DETECTED Final   Bordetella pertussis NOT DETECTED NOT DETECTED Final   Bordetella Parapertussis NOT DETECTED NOT DETECTED Final   Chlamydophila pneumoniae NOT DETECTED NOT DETECTED Final   Mycoplasma pneumoniae NOT DETECTED NOT DETECTED Final    Comment: Performed at [facility], [address], [place], [place] [address]  MRSA Next Gen by PCR, Nasal     Status: None   Collection Time: 06/13/24 11:02 PM   Specimen: Nasal Mucosa; Nasal Swab  Result Value Ref Range Status   MRSA by PCR Next Gen NOT DETECTED NOT DETECTED Final    Comment: (NOTE) The GeneXpert MRSA Assay (FDA approved for NASAL specimens only), is one component of a comprehensive MRSA colonization surveillance program. It is not intended to diagnose MRSA infection nor to guide or monitor treatment for MRSA infections. Test performance is not FDA approved in patients less than 69 years old. Performed at [facility], [address], [place], [place] [address]     Today   Subjective    Sixto Bowdish today has no headache,no chest abdominal pain,no new weakness tingling or numbness, feels much better wants to go home today.     Objective   Blood pressure 126/78, pulse 86, temperature 98.8 F (37.1 C), temperature source Oral, resp. rate 18, height 5' 10 (1.778 m), weight  120.1 kg, SpO2 (!) 2%.   Intake/Output Summary (Last 24 hours) at 06/21/2024 9277 Last data filed at 06/21/2024 0200 Gross per 24 hour  Intake --  Output 1200 ml  Net -1200 ml    Exam  Awake Alert, No new F.N deficits,    [place].AT,PERRAL Supple Neck,   Symmetrical Chest wall movement, Good air movement bilaterally, CTAB RRR,No Gallops,   +ve B.Sounds, Abd Soft, Non tender,  No Cyanosis, Clubbing or edema    Data Review   Recent Labs  Lab 06/17/24 0316 06/18/24 0414 06/19/24 0343 06/20/24 0353 06/21/24 0412  WBC 24.3* 22.2* 18.2* 13.1* 9.6  HGB 7.5* 7.1*  8.0* 8.2* 8.1*  HCT 22.8* 21.8* 24.4* 25.3* 24.7*  PLT 161 184 190 203 203  MCV 84.1 85.8 85.0 86.9 86.4  MCH 27.7 28.0 27.9 28.2 28.3  MCHC 32.9 32.6 32.8 32.4 32.8  RDW 14.5 14.4 14.6 14.8 16.1*  LYMPHSABS 2.1 1.6 2.3 2.0 1.7  MONOABS 1.2* 2.0* 1.2* 0.9 0.7  EOSABS 0.0 0.7* 0.4 0.5 0.4  BASOSABS 0.1 0.2* 0.0 0.2* 0.1    Recent Labs  Lab 06/14/24 1357 06/14/24 1648 06/15/24 0442 06/15/24 0442 06/16/24 0428 06/17/24 0316 06/18/24 0414 06/19/24 0343 06/20/24 0353  NA  --   --  132*   < > 133* 136 134* 136 134*  K  --   --  4.9   < > 4.6 4.5 4.3 4.7 4.3  CL  --   --  99   < > 99 104 101 104 104  CO2  --   --  24   < > 25 29 28 25 26   ANIONGAP  --   --  9   < > 9 3* 5 6 5   GLUCOSE  --   --  100*   < > 125* 92 80 79 79  BUN  --   --  38*   < > 34* 25* 23 14 12   CREATININE  --   --  1.81*   < > 1.19 1.06 1.03 0.90 0.95  AST  --   --  34  --  31  --   --   --   --   ALT  --   --  21  --  25  --   --   --   --   ALKPHOS  --   --  54  --  52  --   --   --   --   BILITOT  --   --  0.7  --  0.6  --   --   --   --   ALBUMIN  --   --  2.1*  --  2.1*  --   --   --   --   CRP  --   --   --   --   --  6.1* 2.9* <3.0* <3.0*  PROCALCITON  --   --   --   --   --  9.33 3.83 2.14 1.29  LATICACIDVEN 2.6* 2.2*  --   --   --   --   --   --   --   MG  --   --   --   --   --  2.3 1.9 1.9 1.9  CALCIUM   --   --  7.4*   < >  7.5* 7.8* 7.6* 8.0* 8.3*   < > = values in this interval not displayed.    Total Time in preparing paper work, data evaluation and todays exam - 35 minutes  Signature  -    Lavada Stank M.D on 06/21/2024 at 7:22 AM   -  To page go to [email or web address]      "

## 2024-06-21 NOTE — Discharge Instructions (Signed)
 Follow with Primary MD Jason Leita Repine, FNP (Inactive) in 7 days follow-up with the recommended urologist within a week  Get CBC, CMP, Magnesium , 2 view Chest X ray -  checked next visit with your primary MD   Activity: As tolerated with Full fall precautions use walker/cane & assistance as needed  Disposition Home    Diet: Heart Healthy    Special Instructions: If you have smoked or chewed Tobacco  in the last 2 yrs please stop smoking, stop any regular Alcohol  and or any Recreational drug use.  On your next visit with your primary care physician please Get Medicines reviewed and adjusted.  Please request your Prim.MD to go over all Hospital Tests and Procedure/Radiological results at the follow up, please get all Hospital records sent to your Prim MD by signing hospital release before you go home.  If you experience worsening of your admission symptoms, develop shortness of breath, life threatening emergency, suicidal or homicidal thoughts you must seek medical attention immediately by calling 911 or calling your MD immediately  if symptoms less severe.  You Must read complete instructions/literature along with all the possible adverse reactions/side effects for all the Medicines you take and that have been prescribed to you. Take any new Medicines after you have completely understood and accpet all the possible adverse reactions/side effects.   Do not drive when taking Pain medications.  Do not take more than prescribed Pain, Sleep and Anxiety Medications  Wear Seat belts while driving.

## 2024-06-21 NOTE — TOC Transition Note (Signed)
 Transition of Care [facility]) - Discharge Note   Patient Details  Name: Steven Barnett MRN: [id number] Date of Birth: 03/31/1951  [facility] of Care [facility] CM/SW Contact:  [name], RN Phone Number: 06/21/2024, 8:01 AM   Clinical Narrative:     Patient will be discharging home today, with transportation by daughter.   HH arranged through [facility], info on AVS.   DME-RW and BSC rec - patient refused due to having a walker at home and reports he does not want a BSC.   No further needs identified by CM.  Final next level of care: Home w Home Health Services Barriers to Discharge: No Barriers Identified   Patient Goals and CMS Choice            Discharge Placement                       Discharge Plan and Services Additional resources added to the After Visit Summary for                            HH Arranged: PT, OT HH Agency: [facility] Date [facility] Agency Contacted: 06/19/24 Time HH Agency Contacted: [phone] Representative spoke with at [facility] Agency: Joane  Social Drivers of Health (SDOH) Interventions SDOH Screenings   Food Insecurity: No Food Insecurity (06/13/2024)  Housing: Low Risk (06/13/2024)  Transportation Needs: No Transportation Needs (06/13/2024)  Utilities: At Risk (06/13/2024)  Depression (PHQ2-9): Low Risk (12/05/2023)  Social Connections: Moderately Isolated (06/13/2024)  Tobacco Use: Medium Risk (06/15/2024)     Readmission Risk Interventions     No data to display

## 2024-06-21 NOTE — Progress Notes (Addendum)
 DISCHARGE NOTE HOME Steven Barnett to be discharged Home per MD order. Discussed prescriptions and follow up appointments with the patient. Prescriptions given to patient; medication list explained in detail. Patient verbalized understanding.  Skin clean, dry and intact without evidence of skin break down, no evidence of skin tears noted. IV catheter discontinued intact. Site without signs and symptoms of complications. Dressing and pressure applied. Pt denies pain at the site currently. No complaints noted.  Discharging with foley Patient free of other lines, drains, and wounds.   An After Visit Summary (AVS) was printed and given to the patient. Patient escorted via wheelchair, to discharge lounge to wait for ride and will be discharged home via private auto.  Steven [name], RN

## 2024-06-21 NOTE — Plan of Care (Signed)

## 2024-06-24 ENCOUNTER — Telehealth: Payer: Self-pay

## 2024-06-24 NOTE — Transitions of Care (Post Inpatient/ED Visit) (Signed)
" ° °  06/24/2024  Name: Steven Barnett MRN: [id number] DOB: [birth year]  Today's TOC FU Call Status: Today's TOC FU Call Status:: Unsuccessful Call (1st Attempt) Unsuccessful Call (1st Attempt) Date: 06/24/24  Attempted to reach the patient regarding the most recent Inpatient/ED visit.  Follow Up Plan: Additional outreach attempts will be made to reach the patient to complete the Transitions of Care (Post Inpatient/ED visit) call.   Medford Balboa, BSN, RN [place]  [facility]BCI - Population Health RN Care Manager [phone]  "

## 2024-06-24 NOTE — Telephone Encounter (Signed)
 Lvm for him to sched

## 2024-06-24 NOTE — Telephone Encounter (Signed)
 Copied from CRM #[id number]. Topic: Clinical - Home Health Verbal Orders >> Jun 24, 2024 10:27 AM Treva T wrote: Caller/Agency: Manuelita, Nurse with [facility]   Callback Number: [phone]  Service Requested: Skilled Nursing  Frequency: Once a week for 5 weeks   Any new concerns about the patient? Yes, pt has a foley catheter, wants to know if it is ok to take orders from urologist regarding foley.  Per Alan for you schedule him with a provider  because he will need appointment for order.

## 2024-06-25 ENCOUNTER — Telehealth: Payer: Self-pay

## 2024-06-25 NOTE — Transitions of Care (Post Inpatient/ED Visit) (Signed)
" ° °  06/25/2024  Name: Steven Barnett MRN: [id number] DOB: 09/08/1950  Today's TOC FU Call Status: Today's TOC FU Call Status:: Unsuccessful Call (2nd Attempt) Unsuccessful Call (2nd Attempt) Date: 06/25/24  Attempted to reach the patient regarding the most recent Inpatient/ED visit. Unable to leave a HIPAA approved voicemail message to phone number provided in demographics per DPR because mailbox was full.    Follow Up Plan: Additional outreach attempts will be made to reach the patient to complete the Transitions of Care (Post Inpatient/ED visit) call.   Richerd Fish, RN, BSN, CCM [facility], [facility] Management Coordinator Direct Dial: [phone]        "

## 2024-06-28 ENCOUNTER — Telehealth: Payer: Self-pay | Admitting: Family

## 2024-06-28 ENCOUNTER — Other Ambulatory Visit: Payer: Self-pay | Admitting: Family

## 2024-06-28 NOTE — Telephone Encounter (Signed)
 Called pts daughter and advised her that he needs a toc due to pcp being gone so we can gets his medications situated. I could not find a good date for her so advised her to call the front office to set up a new pcp date and time she was understanding

## 2024-06-28 NOTE — Telephone Encounter (Signed)
 Unable to pend requested medication refill . Medication: lisinopril  (ZESTRIL ) 5 MG tablet

## 2024-06-28 NOTE — Telephone Encounter (Signed)
 Agree, needs TOC, they requested lisinopril  but that is not an active medication.

## 2024-06-28 NOTE — Telephone Encounter (Unsigned)
 Copied from CRM [id number]. Topic: Clinical - Medication Refill >> Jun 28, 2024  9:44 AM Ashley R wrote: Medication: lisinopril  (ZESTRIL ) 5 MG tablet  Has the patient contacted their pharmacy? Yes   This is the patient's preferred pharmacy:  [facility] [address] [place], [place] - [address] [place] [address] Phone: [phone] Fax: [phone]   Is this the correct pharmacy for this prescription? Yes If no, delete pharmacy and type the correct one.   Has the prescription been filled recently? Yes  Is the patient out of the medication? Yes  Has the patient been seen for an appointment in the last year OR does the patient have an upcoming appointment? Yes  Can we respond through MyChart? N/A  Agent: Please be advised that Rx refills may take up to 3 business days. We ask that you follow-up with your pharmacy.

## 2024-07-12 ENCOUNTER — Telehealth: Payer: Self-pay | Admitting: Neurology

## 2024-07-12 NOTE — Telephone Encounter (Signed)
 Needs transfer of Care appt scheduled.   Copied from CRM [id number]. Topic: Clinical - Home Health Verbal Orders >> Jul 12, 2024  3:12 PM Tinnie BROCKS wrote: Lamar with [facility] home health is calling to notify us  that pt refused therapy this week and he is supposed to be d/c next week given he goes to urologist to remove catheter. She has not been able to get in touch with him at all regarding PT/skilled nursing. If any questions, please reach out to Lele at [phone]

## 2024-07-23 ENCOUNTER — Encounter: Payer: Self-pay | Admitting: Internal Medicine

## 2024-07-23 DIAGNOSIS — I4891 Unspecified atrial fibrillation: Secondary | ICD-10-CM

## 2024-07-23 MED ORDER — APIXABAN 5 MG PO TABS: 5.0000 mg | ORAL_TABLET | Freq: Two times a day (BID) | ORAL | 0 refills | Status: AC

## 2024-08-01 NOTE — Telephone Encounter (Signed)
 Patient will need transfer of care appt scheduled with any provider please.

## 2024-08-01 NOTE — Telephone Encounter (Signed)
 Lvm, unable to schedule.

## 2024-08-07 ENCOUNTER — Telehealth: Payer: Self-pay | Admitting: Family

## 2024-08-07 NOTE — Telephone Encounter (Signed)
 Copied from CRM #[id number]. Topic: General - Other >> Aug 07, 2024  2:38 PM Burnard DEL wrote: Reason for CRM: Apolinar from [facility] called in ,in regards to orders that were faxed over on 07/01/2024 for order [id number] for recert and Plan of care. She stated that they have not received orders back yet. She refaxed orders today and is requesting a phone call as well.
